# Patient Record
Sex: Female | Born: 1935 | Race: White | Hispanic: No | Smoking: Never smoker
Health system: Southern US, Community
[De-identification: ages and names within clinical notes are randomized; demographics above are authoritative.]

## PROBLEM LIST (undated history)

## (undated) DIAGNOSIS — K219 Gastro-esophageal reflux disease without esophagitis: Secondary | ICD-10-CM

## (undated) DIAGNOSIS — I10 Essential (primary) hypertension: Secondary | ICD-10-CM

## (undated) DIAGNOSIS — C439 Malignant melanoma of skin, unspecified: Secondary | ICD-10-CM

## (undated) DIAGNOSIS — I499 Cardiac arrhythmia, unspecified: Secondary | ICD-10-CM

## (undated) DIAGNOSIS — I739 Peripheral vascular disease, unspecified: Secondary | ICD-10-CM

## (undated) DIAGNOSIS — E039 Hypothyroidism, unspecified: Secondary | ICD-10-CM

## (undated) DIAGNOSIS — I82409 Acute embolism and thrombosis of unspecified deep veins of unspecified lower extremity: Secondary | ICD-10-CM

## (undated) HISTORY — PX: BACK SURGERY: SHX140

## (undated) HISTORY — PX: HIP ARTHROPLASTY: SHX981

## (undated) HISTORY — DX: Malignant melanoma of skin, unspecified: C43.9

## (undated) HISTORY — PX: EYE SURGERY: SHX253

## (undated) HISTORY — PX: APPENDECTOMY: SHX54

## (undated) HISTORY — PX: ABDOMINAL HYSTERECTOMY: SHX81

## (undated) HISTORY — DX: Acute embolism and thrombosis of unspecified deep veins of unspecified lower extremity: I82.409

## (undated) HISTORY — PX: BREAST SURGERY: SHX581

## (undated) HISTORY — PX: TONSILLECTOMY: SUR1361

## (undated) HISTORY — PX: WRIST FRACTURE SURGERY: SHX121

## (undated) HISTORY — PX: OTHER SURGICAL HISTORY: SHX169

---

## 1998-09-19 ENCOUNTER — Ambulatory Visit (HOSPITAL_COMMUNITY): Admission: RE | Admit: 1998-09-19 | Discharge: 1998-09-19 | Payer: Self-pay | Admitting: Gastroenterology

## 2001-07-03 ENCOUNTER — Encounter: Admission: RE | Admit: 2001-07-03 | Discharge: 2001-07-03 | Payer: Self-pay | Admitting: Surgery

## 2001-07-03 ENCOUNTER — Encounter: Payer: Self-pay | Admitting: Surgery

## 2002-07-05 ENCOUNTER — Encounter: Payer: Self-pay | Admitting: Family Medicine

## 2002-07-05 ENCOUNTER — Encounter: Admission: RE | Admit: 2002-07-05 | Discharge: 2002-07-05 | Payer: Self-pay | Admitting: Family Medicine

## 2002-08-13 ENCOUNTER — Encounter: Admission: RE | Admit: 2002-08-13 | Discharge: 2002-08-13 | Payer: Self-pay | Admitting: Family Medicine

## 2002-08-13 ENCOUNTER — Encounter: Payer: Self-pay | Admitting: Family Medicine

## 2005-07-31 ENCOUNTER — Encounter: Admission: RE | Admit: 2005-07-31 | Discharge: 2005-07-31 | Payer: Self-pay | Admitting: Family Medicine

## 2006-04-28 ENCOUNTER — Encounter (INDEPENDENT_AMBULATORY_CARE_PROVIDER_SITE_OTHER): Payer: Self-pay | Admitting: *Deleted

## 2006-04-28 ENCOUNTER — Encounter: Admission: RE | Admit: 2006-04-28 | Discharge: 2006-04-28 | Payer: Self-pay | Admitting: Family Medicine

## 2007-03-26 ENCOUNTER — Inpatient Hospital Stay (HOSPITAL_COMMUNITY): Admission: EM | Admit: 2007-03-26 | Discharge: 2007-03-29 | Payer: Self-pay | Admitting: Emergency Medicine

## 2007-03-27 ENCOUNTER — Encounter (INDEPENDENT_AMBULATORY_CARE_PROVIDER_SITE_OTHER): Payer: Self-pay | Admitting: Internal Medicine

## 2007-04-07 ENCOUNTER — Ambulatory Visit (HOSPITAL_COMMUNITY): Admission: RE | Admit: 2007-04-07 | Discharge: 2007-04-07 | Payer: Self-pay | Admitting: Family Medicine

## 2007-04-14 ENCOUNTER — Ambulatory Visit: Payer: Self-pay | Admitting: Internal Medicine

## 2007-05-11 ENCOUNTER — Encounter: Admission: RE | Admit: 2007-05-11 | Discharge: 2007-05-11 | Payer: Self-pay | Admitting: Family Medicine

## 2007-07-15 ENCOUNTER — Ambulatory Visit: Payer: Self-pay | Admitting: Internal Medicine

## 2007-10-19 ENCOUNTER — Encounter: Admission: RE | Admit: 2007-10-19 | Discharge: 2007-10-19 | Payer: Self-pay

## 2008-05-31 ENCOUNTER — Encounter: Admission: RE | Admit: 2008-05-31 | Discharge: 2008-05-31 | Payer: Self-pay | Admitting: Family Medicine

## 2009-06-12 ENCOUNTER — Encounter: Admission: RE | Admit: 2009-06-12 | Discharge: 2009-06-12 | Payer: Self-pay | Admitting: Internal Medicine

## 2009-06-17 ENCOUNTER — Encounter: Admission: RE | Admit: 2009-06-17 | Discharge: 2009-06-17 | Payer: Self-pay | Admitting: Orthopedic Surgery

## 2009-06-23 ENCOUNTER — Encounter: Admission: RE | Admit: 2009-06-23 | Discharge: 2009-06-23 | Payer: Self-pay | Admitting: Orthopedic Surgery

## 2009-06-28 ENCOUNTER — Ambulatory Visit (HOSPITAL_BASED_OUTPATIENT_CLINIC_OR_DEPARTMENT_OTHER): Admission: RE | Admit: 2009-06-28 | Discharge: 2009-06-28 | Payer: Self-pay | Admitting: Orthopedic Surgery

## 2009-12-24 ENCOUNTER — Emergency Department (HOSPITAL_COMMUNITY): Admission: EM | Admit: 2009-12-24 | Discharge: 2009-12-24 | Payer: Self-pay | Admitting: Emergency Medicine

## 2010-06-26 ENCOUNTER — Encounter: Admission: RE | Admit: 2010-06-26 | Discharge: 2010-06-26 | Payer: Self-pay | Admitting: Internal Medicine

## 2010-08-20 ENCOUNTER — Ambulatory Visit (HOSPITAL_COMMUNITY): Admission: RE | Admit: 2010-08-20 | Discharge: 2010-08-20 | Payer: Self-pay | Admitting: Internal Medicine

## 2011-02-16 LAB — POCT HEMOGLOBIN-HEMACUE: Hemoglobin: 13.4 g/dL (ref 12.0–15.0)

## 2011-02-17 LAB — BASIC METABOLIC PANEL
BUN: 15 mg/dL (ref 6–23)
CO2: 30 mEq/L (ref 19–32)
Calcium: 9.4 mg/dL (ref 8.4–10.5)
Chloride: 107 mEq/L (ref 96–112)
Creatinine, Ser: 0.85 mg/dL (ref 0.4–1.2)
GFR calc Af Amer: >60 mL/min (ref >60)
GFR calc non Af Amer: >60 mL/min (ref >60)
Glucose, Bld: 82 mg/dL (ref 70–99)
Potassium: 4.1 mEq/L (ref 3.5–5.1)
Sodium: 140 mEq/L (ref 135–145)

## 2011-03-26 NOTE — Discharge Summary (Signed)
[name], [name]               ACCOUNT NO.:  [id number]   MEDICAL RECORD NO.:  [id number]          PATIENT TYPE:  INP   LOCATION:  3743                         FACILITY:  [facility]   PHYSICIAN:  Altha Harm, MDDATE OF BIRTH:  1922-12-07   DATE OF ADMISSION:  03/26/2007  DATE OF DISCHARGE:  03/29/2007                               DISCHARGE SUMMARY   DISCHARGE DISPOSITION:  Home.   FINAL DISCHARGE DIAGNOSES:  1. Chest pain, noncardiac.  2. Hypertension, poorly controlled.  3. Right lung nodule.  4. Hyperlipidemia.  5. History of migraines.  6. History of gastroesophageal reflux disease.   DISCHARGE MEDICATIONS:  1. Zocor 20 mg p.o. daily at bedtime.  2. Armour Thyroid 30 mg p.o. daily.  3. Corgard 80 mg p.o. daily.  4. Protonix 40 mg p.o. daily a.c. breakfast.  5. Lisinopril 10 mg p.o. daily.   CONSULTANTS:  [facility], cardiology  portion.   PROCEDURES:  None.   DIAGNOSTIC STUDIES:  1. Portable chest x-ray done on Mar 26, 2007, which showed no active      lung disease, stable mild cardiomegaly.  2. CT angiogram of the chest which showed these impressions:      a.     Negative for pulmonary embolus.      b.     A 1.3 cm ovoid lesion in the superior region of the right       upper lobe.  There is an additional linear density in the superior       segment right upper lobe with adjacent calcifications.  These       findings are indeterminate and a neoplastic process cannot be       excluded.  There are a few additional punctate densities in the       right upper lobe.  This lesion may be better evaluated with PET       scan images to look for hypermetabolic activity.      c.     A determined mass or lesion involving the right hepatic lobe       along the lateral aspect measuring up to 3.2 cm.  This could       better be evaluated with some dedicated abdominal study.      d.     Large right renal cyst.  3. MRI of the abdomen done on Mar 28, 2007, which shows:      a.     Peripheral lesion in the right hepatic lobe, has MR features       compatible with an incidental hemangioma.      b.     Large right renal cyst.      c.     No acute or suspicious abdominal findings.      d.     The spleen, gallbladder and pancreas appear unremarkable.       There is no lymphadenopathy.  Retroaortic left vein is noted       incidentally.  4. Adenosine Myoview which showed a normal examination.  Left  ventricular ejection fraction 72% and left ventricular wall motion      globally normal.  Left ventricular cavity size appears grossly      normal on SPECT images.  There are no fixed or reversible diffusion      defects identified.  5. A 2-D echocardiogram done on Mar 27, 2007, which shows overall left      ventricular systolic function normal, there are no left ventricular      regional wall motion abnormalities.  The left ventricular wall      thickness mildly increased.  There is mild right ventricular      hypertrophy.   CODE STATUS:  FULL CODE.   ALLERGIES:  NO KNOWN DRUG ALLERGIES.   PRESENTING COMPLAINT:  Chest pain.   HISTORY OF PRESENT ILLNESS:  Please see H&P dictated by Dr. Ashley [name] for  the details of the HPI.   HOSPITAL COURSE:  Problem 1. CHEST PAIN.  The patient was admitted with  complaints of chest pain and was placed on the chest pain rule out MI  track.  The patient had serial enzymes performed which were essentially  negative.  Cardiology was consulted for a stress test and they saw the  patient and proceeded with an Adenosine Myoview with results as noted  above.  There was no plan by cardiology to proceed any further with a  cardiac cath at this time.  If the patient continues to have chest pain,  despite a negative stress test, certainly a cardiac catheterization  should be considered on this patient.  This patient has no plans to  follow up with cardiology post her hospital discharge and I will leave   it up to the discretion of her primary care physician to further make  referrals to cardiology if indeed she finds this necessary.   Problem 2. HYPERTENSION.  The patient does not have a known history of  hypertension however had been on Corgard for prophylaxis for migraines.  This could have been masking a diagnosis of hypertension in the past.  However, the patient's blood pressures while hospitalized were  consistently in the 150s/90s which makes her stage I hypertension.  Lisinopril 10 mg p.o. daily has been added to her usual regimen of  Corgard 80 mg p.o. daily.  The patient should follow up with her primary  care physician for further evaluation and titration of antihypertensive  medications.  The patient does have some mild increasing left  ventricular wall thickness which may suggest long-standing history of  hypertension.   Problem 3. INCIDENTAL FINDING OF A LUNG NODULE.  The patient had no  respiratory complaints coming in.  I have spoken at length with the  radiologist regarding this and the advised approach is that old x-rays  should be compared to the current x-rays and these can be sent over to  [facility] where they will be happy to make the comparison and  then if this comparison is not conclusive then a PET scan may be  indicated to further define the nature of the lung nodule.  In lieu of  all this, the patient can certainly be referred to a pulmonologist as an  outpatient for comprehensive evaluation of the lung nodule.  Please note  that the patient and family are very anxious regarding this as they have  had conflicting information regarding the interpretation of the lung  nodule and I would make this a priority in addressing this owing to the  patient's level of  anxiety regarding this.   Problem 4. HYPERLIPIDEMIA.  Studies on cholesterol with a fasting lipid  panel during her hospitalization revealed a mildly elevated lipid profile on this patient  showing her LDL to be mildly elevated at 128 and  triglycerides at 163.  The patient was started on Zocor 20 mg and should  have her LFTs and her cholesterol reevaluated in 6 weeks for further  titration of medication.  Please note that the baseline LFTs on this  patient are completely normal with an alk phos of 69, SGOT of 19, SGPT  of 17, and a bilirubin total of 0.9.   Problem 5. HISTORY OF MIGRAINE HEADACHE.  The patient had no headaches  while hospitalized and she was continued on her Corgard.   Problem 6. HISTORY OF HYPOTHYROIDISM.  The patient was continued on her  Armour Thyroid without any investigation of her thyroid function as she  had no signs or symptoms of hypo- or hyperthyroidism at this time.   CONDITION ON DISCHARGE:  The patient's condition on discharge is stable.   DISCHARGE EXAMINATION:  VITAL SIGNS:  Her blood pressure at discharge is  158/90, heart rate of 64, temperature of 97.9.  GENERAL:  The patient has no complaints of pain.  CARDIOVASCULAR EXAMINATION:  She has a normal S1 and S2, no murmurs,  rubs, or gallops noted and the patient has no chest pain at this time.  RESPIRATORY EXAMINATION:  The patient has normal respiratory effort,  equal excursion bilaterally, no wheezes or rhonchi noted, no changes of  egophony noted.  ABDOMINAL EXAMINATION:  Abdomen is soft, nontender, nondistended, no  masses, no hepatosplenomegaly noted.   FOLLOW UP:  The patient should follow up with her primary care physician  Dr. Herb Grays in 1 week.  Further investigation of the problems noted  in the problem section of this dictation, particularly follow up on the  lung nodule and the chest pain which is noncardiac.  Please note that  the patient has gastroesophageal reflux disease but states she that she  has only been taking her Prilosec on a p.r.n. basis and this may have  some significance in the patient's chest pain at this time.      Altha Harm, MD   Electronically Signed     MAM/MEDQ  D:  03/29/2007  T:  03/29/2007  Job:  [id number]   cc:   Tammy R. Collins Scotland, M.D.

## 2011-03-26 NOTE — Assessment & Plan Note (Signed)
[facility]                             PULMONARY OFFICE NOTE   NAME:STYERSClemma, Johnsen                      MRN:          [id number]  DATE:07/15/2007                            DOB:          1922-12-20    PULMONARY EXTENDED SUMMARY FINAL FOLLOWUP OFFICE VISIT   HISTORY:  This is a very nice 77 year old white female, never smoker,  with an atypical chest pain syndrome that was evaluated with a chest CT  scan showing an ovoid lesion in the superior segment of the right lower  lobe by CT scan dated Mar 26, 2007, but a PET scan dated Apr 07, 2007,  showed no evidence of activity and she returns now for a followup. She  denies any recurrent pain, dyspnea, fevers, chills, sweats, orthopnea,  PND or leg swelling, cough or intended weight loss.   PHYSICAL EXAMINATION:  She is a pleasant ambulatory white female in no  acute distress. She is afebrile with normal vital signs.  Lung fields are perfectly clear bilaterally to auscultation and  percussion.  HEART: Regular rate and rhythm without murmur, gallop or rub.  ABDOMEN: Soft and benign.  EXTREMITIES: Warm without calf tenderness, cyanosis or clubbing or  edema.   Chest x-ray today revealed no evidence of a definite nodule. Her old x-  rays dating back to 2006, also showed no evidence of a nodule.   IMPRESSION:  Microscopic (because it cannot be seen on plain films)  ovoid lesion in the right lower lobe with a negative PET scan is very  unlikely in this never smoker to represent malignancy. Technically she  should probably receive a followup limited CT scan through the same area  at 12 months from the original scan, but the patient after exhaustive  discussion of the risks, benefits and alternatives with the various  approaches has declined further followup. I told her that should she  change her mind that I think it would be reasonable for Dr.  Collins Scotland to  arrange for a limited CT scan through the same  area or alternatively a  regular followup chest x-ray in the context of a yearly comprehensive  health care evaluation would be helpful in that if she develops  macroscopic changes I would proceed directly with an excisional biopsy  rather than try to attempt any other form of biopsy. Pulmonary followup  in this office however is p.r.n.     Charlaine Dalton. Sherene Sires, MD, [facility]  Electronically Signed    MBW/MedQ  DD: 07/15/2007  DT: 07/15/2007  Job #: [id number]   cc:   Tammy R. Collins Scotland, M.D.

## 2011-03-26 NOTE — H&P (Signed)
NAME:  [name], [name] NO.:  [id number]   MEDICAL RECORD NO.:  [id number]          PATIENT TYPE:  INP   LOCATION:  3743                         FACILITY:  [facility]   PHYSICIAN:  Altha Harm, MDDATE OF BIRTH:  1923/08/10   DATE OF ADMISSION:  DATE OF DISCHARGE:                              HISTORY & PHYSICAL   CHIEF COMPLAINT:  Chest pain.   HISTORY OF PRESENT ILLNESS:  This is an 77 year old who looks much  younger than her stated age, who presents to the emergency room after  having an episode of chest pain starting this morning.  According to the  patient, the chest pain started in the epigastric region, and radiated  up the chest to the upper chest area.  The patient describes the pain as  initially a burning progressing up in  sharp pain.  She states that the  pain lasted about 30-45 minute intervals, then resolved, and recurred  again.  Thus, the pain is intermittent in nature.  She states the pain  finally abated when she had nitroglycerin given in the ambulance.  Please note that the patient does have history of gastroesophageal  reflux disease.  She has no history of hypertension, and she has no  history of coronary artery disease.  The patient has had a stress test  approximately 12 years ago, however since then, has had no cardiac  workup.  She states that normally her blood pressure is normal, however  the patient is unable to quantify her idea of normal.  The patient has  been on Corgard for prophylaxis of migraines, and not for hypertension.  The patient denies any associated shortness of breath or diaphoresis.  She denies any dizziness, loss of consciousness, seizure activity, any  claudication.  The patient denies any nausea, vomiting, diarrhea, any  fever or chills.   She does state that she has never had a pain such as this in the past.   PAST MEDICAL HISTORY:  1. Significant for polymyalgia rheumatica, not currently on       medication.  2. Gastroesophageal reflux disease.  3. Migraine headaches.  4. Hypothyroidism.   FAMILY HISTORY:  Significant for coronary artery disease.   SOCIAL HISTORY:  The patient resides alone, however close to her  children.  She denies any tobacco, alcohol, or drug use.  She is  extremely active at a community level.   CURRENT MEDICATIONS:  1. Corgard 80 mg p.o. daily.  2. Armour Thyroid 40 mg p.o. daily.  3. Prilosec 20 mg daily p.r.n.   ALLERGIES:  No known drug allergies.   PRIMARY CARE PHYSICIAN:  Dr. Collins Scotland.   REVIEW OF SYSTEMS:  Fourteen systems were reviewed, and all systems are  negative except as noted in the HPI.   Studies in the emergency room showed the following:  Hemogram showed WBC  of 7.8, hemoglobin 13.1, hematocrit 39.1, platelets 266.  Sodium 141,  potassium 3.5, chloride 109, bicarbonate 29, BUN 18, creatinine 1.0.  CK  81, CK-MB 1.0, troponin less than 0.05.  D-dimer is mildly elevated at  0.77.  A BNP  is 44.  The patient had a 12-lead EKG, which showed normal  sinus rhythm, and a CT pulmonary angiogram shows no evidence of  pulmonary embolus.   PHYSICAL EXAMINATION:  VITAL SIGNS:  On arrival to the emergency room,  the patient had a blood pressure of 160/90.  The blood pressure is now  157/82.  Her heart rate is 66, respiratory rate 16, pulse oximetry 99%  on room air.  HEENT:  Normocephalic, atraumatic.  Pupils are equal, round, reactive to  light and accommodation.  Extraocular movements are intact, fundi  benign.  Tympanic membranes are translucent bilaterally with good  landmarks.  Nasal mucosa showed no polyps.  Oropharynx is moist.  No  exudate, erythema, or induration is noted.  NECK:  Trachea midline.  No masses, no thyromegaly, no JVD, no carotid  bruit.  CARDIOVASCULAR:  The patient has normal S1 and S2.  No murmurs, rubs, or  gallops are noted.  PMI is nondisplaced.  No heaves, thrills with  palpation.  RESPIRATORY:  The patient has  normal respiratory effort.  She has equal  excursion bilaterally.  No wheezing or rhonchi are noted, no increased  vocal fremitus.  She is clear to auscultation.  ABDOMEN:  The patient has normoactive bowel sounds.  She has a  protuberant abdomen, which is nontender, nondistended.  No masses, no  hepatosplenomegaly noted.  LYMPH NODE SURVEY:  She has no cervical, axillary, or inguinal  lymphadenopathy noted.  MUSCULOSKELETAL:  She has no warmth, swelling, or erythema around the  joints.  There is no spinal tenderness to palpation.  Range of motion is  functional in joints of the upper and lower extremities.  PSYCHIATRIC:  She is alert and oriented x3.  She has good insight, good  cognition, and good recent and remote recall.  NEUROLOGIC:  Cranial nerves II-XII are grossly intact.  There are no  focal neurological deficits.  DTRs are 2+ bilaterally to the upper and  lower extremities.  Sensation is intact to light touch, pinprick, and  proprioception.  Strength is 5/5 in the bilateral upper and lower  extremities.   ASSESSMENT AND PLAN:  1. This is a patient who presents with chest pain.  2. Hypertension.  3. The patient does have a history of polymyalgia rheumatica.  4. History of migraine.  5. Hypothyroidism.  6. Reflux disease.   The patient will be ruled out for MI with serial enzymes, and if her  serial enzymes are normal, we will progress to a stress test with  discussion about whether the stress test will take place in the  hospital.  Blood pressures will be monitored overnight.  The patient is  already on a beta-blocker, and we will continue to work to control blood  pressures to keep systolics less than 150, and diastolics less than 100.  The patient will be resumed on her usual medications including Corgard,  Armour Thyroid, and Protonix while hospitalized.      Altha Harm, MD  Electronically Signed    MAM/MEDQ  D:  03/26/2007  T:  03/27/2007  Job:   [id number]

## 2011-03-26 NOTE — Op Note (Signed)
[name], [name]               ACCOUNT NO.:  [id number]   MEDICAL RECORD NO.:  [id number]          PATIENT TYPE:  AMB   LOCATION:  [facility]                         FACILITY:  [facility]   PHYSICIAN:  Marlowe Kays, M.D.  DATE OF BIRTH:  1923-10-28   DATE OF PROCEDURE:  06/28/2009  DATE OF DISCHARGE:                               OPERATIVE REPORT   PREOPERATIVE DIAGNOSES:  1. Torn lateral meniscus.  2. Osteoarthritis, right knee.   POSTOPERATIVE DIAGNOSES:  1. Torn medial and lateral menisci, right knee.  2. Osteoarthritis.   OPERATION:  Right knee arthroscopy with  1. Partial medial meniscectomy.  2. Shaving of the medial femoral condyle.   SURGEON:  Marlowe Kays, M.D.   ASSISTANT:  Nurse.   ANESTHESIA:  General.   PATHOLOGY AND JUSTIFICATION FOR PROCEDURE:  Painful right knee.  Because  of painful right knee with tenderness on the lateral joint line, I  obtained an MRI demonstrating the above pathology.  She also on  inspection had some fairly significant border tearing of the medial  meniscus posteriorly.   PROCEDURE:  Ace wrap and knee support to left lower extremity pneumatic  tourniquet applied to right lower extremity, with the leg Esmarched out  non sterilely and the tourniquet inflated to 300 mmHg.  Thigh stabilizer  applied.  The right leg prepped with DuraPrep from stabilizer to ankle,  draped in sterile field.  Time-out performed.  Superior medial saline  inflow.  First through an anterolateral portal medial compartment joint  was evaluated.  In general her medial compartment knee joint looked  good.  There was approximately grade 2/4 chondromalacia of the medial  femoral condyle which I smoothed down with 3.5 shaver.  Posteriorly she  had serration to the posterior medial inner border of the meniscus which  I shaved down until smooth with a 3.5 shaver as well.  Final pictures  were taken.  I then looked up the medial gutter and suprapatellar area.  She had  minimal wear of the patella.  No other abnormalities noted.  I  then reversed portals.  Her lateral meniscus was badly macerated and  torn and I first cleaned it up somewhat with a 3.5 shaver for better  visualization and then used arthroscopic scissors to trim back the torn  segment of the anterior lateral meniscus and removing the fragments with  the shaver once again.  Then looking posteriorly she actually had  additional tearing in the mid posterior inner border.  After I shaved  down until smooth with a 3.5 shaver she had minimal wear of the lateral  femoral condyle.  The joint was irrigated until clear and all fluid  possible was removed.  I closed the two anterior portals and with 4-0  nylon and then injected 20 mL 0.5%  Marcaine adrenaline through the inflow apparatus closing this portal  with 4-0 nylon as well.  Betadine Adaptic dry sterile dressing were  applied.  Tourniquet was released.  She tolerated the procedure well was  taken, was taken to recovery room in satisfactory condition with no  known complications.  ______________________________  Marlowe Kays, M.D.     JA/MEDQ  D:  06/28/2009  T:  06/29/2009  Job:  [id number]

## 2011-03-26 NOTE — Op Note (Signed)
[name], [name]               ACCOUNT NO.:  [id number]   MEDICAL RECORD NO.:  [id number]          PATIENT TYPE:  AMB   LOCATION:  [facility]                         [facility]ACILITY:  [facility]   PHYSICIAN:  Marlowe Kays, M.D.  DATE OF BIRTH:  05/06/1923   DATE OF PROCEDURE:  06/28/2009  DATE OF DISCHARGE:  06/28/2009                               OPERATIVE REPORT   ADDENDUM:  The only error is in the operation, which was stated:  1. Right knee arthroscopy with partial medial meniscectomy.  2. Shaving of medial femoral condyle.   This should read:  1. Right knee arthroscopy with partial medial and lateral      meniscectomies.  2. Shaving of medial femoral condyle.  3. I performed a lateral meniscectomy as well, and this was not      reflected in the operation title.           ______________________________  Marlowe Kays, M.D.     JA/MEDQ  D:  08/18/2009  T:  08/18/2009  Job:  [id number]

## 2011-03-26 NOTE — Assessment & Plan Note (Signed)
[facility]                             PULMONARY OFFICE NOTE   NAME:STYERSMaddyn, Lieurance                      MRN:          [id number]  DATE:04/14/2007                            DOB:          22-Nov-1922    HISTORY:  This is an 77 year old white female, never a smoker, who  presents with intermittent chest pain, dating back 18 months, typically  in the center, consisting of a centralized chest pressure.  It comes  and goes within 15 minutes, maybe better after burping, but had a severe  episode 2 weeks ago and ended up in the emergency room.  She was  admitted to the hospital on Mar 18, 2007 for evaluation of chest pain and  had essentially a negative cardiac workup with a CT scan of the chest,  indicating a right lower lobe nodule which on PET scanning was negative  for uptake.  There was a1.3 cm ovoid shapedlesion in the superior  segment of the right lower lobe with several other non-specific lung  densities.  Subsequent PET scan was done on Apr 07, 2007 indicating no  evidence of hyper metabolism, and she is now seen at Dr. Yehuda Budd'  request.  The patient has not had any pleuritic pain or cough.  She says  she has never had posterior chest discomfort on the right, just anterior  centralized pain as discussed above.  This pain is not exertional nor  pleuritic and seemed to resolve spontaneously just as well as it did  with any medicine that she has ever received or could give credit to.   PAST MEDICAL HISTORY:  Significant for hysterectomy, appendectomy.  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 Phylliss Bob,  chronically on steroids.   ALLERGIES:  None known.   MEDICATIONS:  1. Corgard.  2. Thyroid.  3. Prednisone.   SOCIAL HISTORY:  She has never smoked.  She lives alone.   FAMILY HISTORY:  Positive for breast cancer in her mother, emphysema in  her father.  She has been exposed to passive cigarette smoke by her  father and  husband.   REVIEW OF SYSTEMS:  Taken in detail on the work sheet, significant for  the problems as outlined above.   PHYSICAL EXAMINATION:  This is a pleasant, but anxious white female in  no acute distress.  She has stable vital signs.  HEENT:  Unremarkable.  Pharynx clear. Dentition intact.  NECK:  Supple without cervical adenopathy or tenderness.  Trachea is  midline.No thyromegaly.  LUNG FIELDS:  Perfectly clear bilaterally to auscultation and  percussion.  There is a regular rate and rhythm without murmur, gallop or rub  present.  ABDOMEN:  Soft, benign with no palpable organomegaly, mass or  tenderness.  EXTREMITIES:  Warm without calf tenderness, cyanosis, clubbing or edema.   Hemoglobin saturation was 96% on room air.  She does have a chest x-ray  from 2006 that was read as normal.  Interestingly, a chest x-ray from  2000 shows a densely calcified right lower lobe nodule.   IMPRESSION:  This patient appears  to have a new non-calcified nodule  along with an old calcified nodule, both in the right lower lobe, which  have nothing to do with her recent symptomatology.  Although the PET  scan is reassuring in that it is negative, this could still represent a  well differentiated, slowly growing malignancy.  Her only risk factor is  that she has been exposed both by her father and late husband to  cigarette smoke.   However, this lesion is too small to biopsy so we are left with the  issue of whether she needs to go ahead and have an excisional biopsy  done by VATS or a watch and wait approach.  Statistically, I think it is  unlikely to be malignancy based on the PET scan and the fact that she  has never smoked and therefore I recommended that she simply return her  in 6 weeks with a new set of x-rays, at that time bring her old x-rays  with her.  If we can clearly identify the lesion on a plain film, then  it is not necessary to follow it so closely on CT scan and it would   spare her significant radiation exposure.  If it shows any growth at  all, it needs to be removed regardless of whether it ultimately proves  to be a benign lesion.     Charlaine Dalton. Sherene Sires, MD, [facility]  Electronically Signed    MBW/MedQ  DD: 04/15/2007  DT: 04/15/2007  Job #: [id number]   cc:   Tammy R. Collins [place], M.D.

## 2011-05-28 ENCOUNTER — Other Ambulatory Visit: Payer: Self-pay | Admitting: Internal Medicine

## 2011-05-28 DIAGNOSIS — Z1231 Encounter for screening mammogram for malignant neoplasm of breast: Secondary | ICD-10-CM

## 2011-07-10 ENCOUNTER — Ambulatory Visit
Admission: RE | Admit: 2011-07-10 | Discharge: 2011-07-10 | Disposition: A | Discharge status: Home / Self Care | Payer: BC Managed Care – PPO | Source: Ambulatory Visit | Attending: Internal Medicine | Admitting: Internal Medicine

## 2011-07-10 DIAGNOSIS — Z1231 Encounter for screening mammogram for malignant neoplasm of breast: Secondary | ICD-10-CM

## 2011-10-23 ENCOUNTER — Other Ambulatory Visit: Payer: Self-pay | Admitting: Physician Assistant

## 2011-11-01 ENCOUNTER — Encounter (HOSPITAL_COMMUNITY): Payer: Self-pay

## 2011-11-18 ENCOUNTER — Encounter (HOSPITAL_COMMUNITY)
Admission: RE | Admit: 2011-11-18 | Discharge: 2011-11-18 | Disposition: A | Discharge status: Home / Self Care | Payer: Medicare Other | Source: Ambulatory Visit | Attending: Orthopedic Surgery | Admitting: Orthopedic Surgery

## 2011-11-18 ENCOUNTER — Other Ambulatory Visit: Payer: Self-pay

## 2011-11-18 ENCOUNTER — Encounter (HOSPITAL_COMMUNITY): Payer: Self-pay

## 2011-11-18 HISTORY — DX: Essential (primary) hypertension: I10

## 2011-11-18 HISTORY — DX: Gastro-esophageal reflux disease without esophagitis: K21.9

## 2011-11-18 HISTORY — DX: Hypothyroidism, unspecified: E03.9

## 2011-11-18 LAB — BASIC METABOLIC PANEL
BUN: 17 mg/dL (ref 6–23)
CO2: 26 mEq/L (ref 19–32)
Calcium: 9.4 mg/dL (ref 8.4–10.5)
Chloride: 105 mEq/L (ref 96–112)
Creatinine, Ser: 1.02 mg/dL (ref 0.50–1.10)
GFR calc Af Amer: 56 mL/min — ABNORMAL LOW (ref >90)
GFR calc non Af Amer: 48 mL/min — ABNORMAL LOW (ref >90)
Glucose, Bld: 107 mg/dL — ABNORMAL HIGH (ref 70–99)
Potassium: 3.9 mEq/L (ref 3.5–5.1)
Sodium: 141 mEq/L (ref 135–145)

## 2011-11-18 LAB — CBC
HCT: 38.6 % (ref 36.0–46.0)
Hemoglobin: 12.7 g/dL (ref 12.0–15.0)
MCH: 29.8 pg (ref 26.0–34.0)
MCHC: 32.9 g/dL (ref 30.0–36.0)
MCV: 90.6 fL (ref 78.0–100.0)
Platelets: 301 10*3/uL (ref 150–400)
RBC: 4.26 MIL/uL (ref 3.87–5.11)
RDW: 13 % (ref 11.5–15.5)
WBC: 7.6 10*3/uL (ref 4.0–10.5)

## 2011-11-18 LAB — SURGICAL PCR SCREEN
MRSA, PCR: NEGATIVE
Staphylococcus aureus: NEGATIVE

## 2011-11-18 NOTE — Patient Instructions (Signed)
20 [name]  11/18/2011   Your procedure is scheduled on:  11/20/11 0830-1100 am   Report to [facility] at 0630 AM.  Call this number if you have problems the morning of surgery: [phone]   Remember:   Do not eat food:After Midnight.  May have clear liquids:until Midnight .  Clear liquids include soda, tea, black coffee, apple or grape juice, broth.  Take these medicines the morning of surgery with A SIP OF WATER:    Do not wear jewelry, make-up or nail polish.  Do not wear lotions, powders, or perfumes.   Do not shave 48 hours prior to surgery.  Do not bring valuables to the hospital.  Contacts, dentures or bridgework may not be worn into surgery.  Leave suitcase in the car. After surgery it may be brought to your room.  For patients admitted to the hospital, checkout time is 11:00 AM the day of discharge.     Special Instructions: CHG Shower Use Special Wash: 1/2 bottle night before surgery and 1/2 bottle morning of surgery. shower chin to toes with CHG.  Wash face and private parts with regular soap.    Please read over the following fact sheets that you were given: MRSA Information, Incentive Spirometry Fact Sheet, coughing and deep breathing exercises, leg exercises

## 2011-11-18 NOTE — Pre-Procedure Instructions (Signed)
11/18/11 requested latest CXR done and to fax to [facility]- PST.

## 2011-11-19 NOTE — Pre-Procedure Instructions (Signed)
11/19/11 received CXR date 6/11 from PCP office.  Placed on chart.  Pt will need CXR day of surgery.  Order placed in computer.

## 2011-11-19 NOTE — H&P (Signed)
NAME:  [name], [name]                    ACCOUNT NO.:  MEDICAL RECORD NO.:  [id number]  LOCATION:                                 FACILITY:  PHYSICIAN:  Marlowe Kays, M.D.  DATE OF BIRTH:  Mar 14, 1923  DATE OF ADMISSION:  11/20/2011 DATE OF DISCHARGE:                             HISTORY & PHYSICAL   CHIEF COMPLAINT:  "Pain in my legs."  PRESENT ILLNESS:  This 78 year old, white female is seen by [place] for continued progressive problems, concerning pain into her low back with radiation down the left lower extremity.  She is noted to have pain, weakness, as well as discomfort with her present illness.  MRI has shown conservative amount of stenosis in the lumbar spine.  Also an MRI was noted to have some renal cyst, thus she was followed up with Dr. Isabel [name] with Urology.  Discussion was had with the patient, the family, and Dr. Simonne [name] about the next step and has agreed after the risks and benefits of surgery were described to them to go ahead with a decompressive lumbar laminectomy.  PAST MEDICAL HISTORY:  She had been in relatively good health throughout her lifetime.  Surgically, she has had appendectomy, hysterectomy, knee surgery, and carpal tunnel release.  She has hypertension, thyroid disorder, and dyspepsia.  CURRENT MEDICATIONS: 1. Prilosec over the counter. 2. Tramadol 50 mg daily. 3. Nadolol 80 mg daily. 4. Levothyroxine 50 daily. 5. Toradol 10 mg, she stopped about 5 days ago. 6. Amlodipine besylate 10 mg daily. 7. Lyrica 50 mg daily.  FAMILY PHYSICIAN:  [name] Garbe, MD  SOCIAL HISTORY:  Neither smokes nor drinks.  FAMILY HISTORY:  Noncontributory.  REVIEW OF SYSTEMS:  CNS:  No seizures or paralysis, numbness, double vision.  RESPIRATORY:  No productive cough.  No hemoptysis.  No shortness of breath.  CARDIOVASCULAR:  No chest pain.  No angina.  No orthopnea.  GASTROINTESTINAL:  No nausea, vomiting, melena, or bloody stool.  GENITOURINARY:  No  discharge, dysuria, hematuria, but she does have stress incontinence and urgency.  PHYSICAL EXAMINATION:  GENERAL:  Alert and cooperative, friendly 78 year old, white female, looking younger than her stated age. VITAL SIGNS:  Blood pressure 120/80, pulse 60, and respirations 12. HEENT:  Normocephalic.  PERRLA.  EOM intact.  Oropharynx is clear. CHEST:  Clear to auscultation.  No rhonchi.  No rales.  No wheezes. HEART:  Regular rate and rhythm.  No murmurs are heard. ABDOMEN:  Soft, nontender. GENITALIA/RECTAL/PELVIC:  Not done, pertinent to present illness. EXTREMITIES:  The patient has no deformities.  Straight leg raise negative bilaterally.  ADMITTING DIAGNOSES: 1. Degenerative disk disease with spinal stenosis. 2. Hypertension. 3. Hypothyroidism. 4. Dyspepsia.  PLAN:  The patient will undergo decompressive lumbar laminectomy.  We went over in detail today concerning the perioperative plan.     [name].   ______________________________ Marlowe Kays, M.D.    DLU/MEDQ  D:  11/18/2011  T:  11/19/2011  Job:  [id number]  cc:   [name], M.D. Fax: [phone]

## 2011-11-20 ENCOUNTER — Inpatient Hospital Stay (HOSPITAL_COMMUNITY): Payer: Medicare Other

## 2011-11-20 ENCOUNTER — Encounter (HOSPITAL_COMMUNITY): Payer: Self-pay | Admitting: Anesthesiology

## 2011-11-20 ENCOUNTER — Inpatient Hospital Stay (HOSPITAL_COMMUNITY)
Admission: RE | Admit: 2011-11-20 | Discharge: 2011-11-25 | DRG: 491 | Disposition: A | Discharge status: Skilled Nursing Facility | Payer: Medicare Other | Source: Ambulatory Visit | Attending: Orthopedic Surgery | Admitting: Orthopedic Surgery

## 2011-11-20 ENCOUNTER — Encounter (HOSPITAL_COMMUNITY): Payer: Self-pay | Admitting: *Deleted

## 2011-11-20 ENCOUNTER — Encounter (HOSPITAL_COMMUNITY)
Admission: RE | Disposition: A | Discharge status: Skilled Nursing Facility | Payer: Self-pay | Source: Ambulatory Visit | Attending: Orthopedic Surgery

## 2011-11-20 ENCOUNTER — Inpatient Hospital Stay (HOSPITAL_COMMUNITY): Payer: Medicare Other | Admitting: Anesthesiology

## 2011-11-20 DIAGNOSIS — M48061 Spinal stenosis, lumbar region without neurogenic claudication: Principal | ICD-10-CM | POA: Diagnosis present

## 2011-11-20 DIAGNOSIS — Z9889 Other specified postprocedural states: Secondary | ICD-10-CM

## 2011-11-20 DIAGNOSIS — E039 Hypothyroidism, unspecified: Secondary | ICD-10-CM | POA: Diagnosis present

## 2011-11-20 DIAGNOSIS — I10 Essential (primary) hypertension: Secondary | ICD-10-CM | POA: Diagnosis present

## 2011-11-20 DIAGNOSIS — K219 Gastro-esophageal reflux disease without esophagitis: Secondary | ICD-10-CM | POA: Diagnosis present

## 2011-11-20 DIAGNOSIS — Z01812 Encounter for preprocedural laboratory examination: Secondary | ICD-10-CM

## 2011-11-20 HISTORY — PX: LUMBAR LAMINECTOMY/DECOMPRESSION MICRODISCECTOMY: SHX5026

## 2011-11-20 SURGERY — LUMBAR LAMINECTOMY/DECOMPRESSION MICRODISCECTOMY
Anesthesia: General | Site: Back | Wound class: Clean

## 2011-11-20 MED ORDER — HEMOSTATIC AGENTS (NO CHARGE) OPTIME
TOPICAL | Status: DC | PRN
  Administered 2011-11-20: 1 via TOPICAL

## 2011-11-20 MED ORDER — LACTATED RINGERS IV SOLN: INTRAVENOUS | Status: DC

## 2011-11-20 MED ORDER — ONDANSETRON HCL 4 MG/2ML IJ SOLN: 4.0000 mg | Freq: Four times a day (QID) | INTRAMUSCULAR | Status: DC | PRN

## 2011-11-20 MED ORDER — EPHEDRINE SULFATE 50 MG/ML IJ SOLN
INTRAMUSCULAR | Status: DC | PRN
  Administered 2011-11-20 (×2): 5 mg via INTRAVENOUS
  Administered 2011-11-20: 10 mg via INTRAVENOUS
  Administered 2011-11-20: 5 mg via INTRAVENOUS
  Administered 2011-11-20: 10 mg via INTRAVENOUS
  Administered 2011-11-20: 5 mg via INTRAVENOUS

## 2011-11-20 MED ORDER — DOCUSATE SODIUM 100 MG PO CAPS
100.0000 mg | ORAL_CAPSULE | Freq: Two times a day (BID) | ORAL | Status: DC
  Administered 2011-11-20 – 2011-11-25 (×10): 100 mg via ORAL
  Filled 2011-11-20 (×15): qty 1

## 2011-11-20 MED ORDER — SODIUM CHLORIDE 0.9 % IJ SOLN: 9.0000 mL | INTRAMUSCULAR | Status: DC | PRN

## 2011-11-20 MED ORDER — METHOCARBAMOL 500 MG PO TABS: 500.0000 mg | ORAL_TABLET | Freq: Four times a day (QID) | ORAL | Status: DC | PRN

## 2011-11-20 MED ORDER — FENTANYL CITRATE 0.05 MG/ML IJ SOLN
INTRAMUSCULAR | Status: DC | PRN
  Administered 2011-11-20: 50 ug via INTRAVENOUS
  Administered 2011-11-20 (×3): 25 ug via INTRAVENOUS

## 2011-11-20 MED ORDER — NALOXONE HCL 0.4 MG/ML IJ SOLN: 0.4000 mg | INTRAMUSCULAR | Status: DC | PRN

## 2011-11-20 MED ORDER — HYDROMORPHONE 0.3 MG/ML IV SOLN
INTRAVENOUS | Status: DC
  Administered 2011-11-20: 12:00:00 via INTRAVENOUS
  Administered 2011-11-20: 0.3 mg via INTRAVENOUS
  Administered 2011-11-20: 0.799 mg via INTRAVENOUS
  Administered 2011-11-21: 0.199 mg via INTRAVENOUS
  Administered 2011-11-21: 17:00:00 via INTRAVENOUS
  Administered 2011-11-21: 0.8 mg via INTRAVENOUS
  Administered 2011-11-21: 1.99 mg via INTRAVENOUS
  Administered 2011-11-21: 0.4 mg via INTRAVENOUS
  Administered 2011-11-21: 1.39 mg via INTRAVENOUS
  Administered 2011-11-21: 0.799 mg via INTRAVENOUS
  Administered 2011-11-22: 0.599 mg via INTRAVENOUS
  Administered 2011-11-22: 0.4 mg via INTRAVENOUS
  Administered 2011-11-22: 0.2 mg via INTRAVENOUS
  Administered 2011-11-22: 0.199 mg via INTRAVENOUS
  Administered 2011-11-22: 0.799 mg via INTRAVENOUS
  Administered 2011-11-22: 0.2 mg via INTRAVENOUS
  Filled 2011-11-20 (×3): qty 25

## 2011-11-20 MED ORDER — 0.9 % SODIUM CHLORIDE (POUR BTL) OPTIME
TOPICAL | Status: DC | PRN
  Administered 2011-11-20: 1000 mL

## 2011-11-20 MED ORDER — SODIUM CHLORIDE 0.9 % IV SOLN: INTRAVENOUS | Status: DC

## 2011-11-20 MED ORDER — DIPHENHYDRAMINE HCL 50 MG/ML IJ SOLN: 12.5000 mg | Freq: Four times a day (QID) | INTRAMUSCULAR | Status: DC | PRN

## 2011-11-20 MED ORDER — PANTOPRAZOLE SODIUM 40 MG PO TBEC
40.0000 mg | DELAYED_RELEASE_TABLET | Freq: Every day | ORAL | Status: DC
  Administered 2011-11-20 – 2011-11-25 (×6): 40 mg via ORAL
  Filled 2011-11-20 (×7): qty 1

## 2011-11-20 MED ORDER — CHLORHEXIDINE GLUCONATE 4 % EX LIQD: 60.0000 mL | Freq: Once | CUTANEOUS | Status: DC

## 2011-11-20 MED ORDER — NADOLOL 80 MG PO TABS
80.0000 mg | ORAL_TABLET | Freq: Every day | ORAL | Status: DC
  Administered 2011-11-21 – 2011-11-25 (×5): 80 mg via ORAL
  Filled 2011-11-20 (×6): qty 1

## 2011-11-20 MED ORDER — HYDROMORPHONE HCL PF 1 MG/ML IJ SOLN
0.2500 mg | INTRAMUSCULAR | Status: DC | PRN
  Administered 2011-11-20 (×4): 0.5 mg via INTRAVENOUS

## 2011-11-20 MED ORDER — SUCCINYLCHOLINE CHLORIDE 20 MG/ML IJ SOLN
INTRAMUSCULAR | Status: DC | PRN
  Administered 2011-11-20: 100 mg via INTRAVENOUS

## 2011-11-20 MED ORDER — CEFAZOLIN SODIUM 1-5 GM-% IV SOLN
1.0000 g | Freq: Four times a day (QID) | INTRAVENOUS | Status: AC
  Administered 2011-11-20 – 2011-11-21 (×3): 1 g via INTRAVENOUS
  Filled 2011-11-20 (×3): qty 50

## 2011-11-20 MED ORDER — ONDANSETRON HCL 4 MG PO TABS: 4.0000 mg | ORAL_TABLET | Freq: Four times a day (QID) | ORAL | Status: DC | PRN

## 2011-11-20 MED ORDER — METHOCARBAMOL 100 MG/ML IJ SOLN
500.0000 mg | Freq: Four times a day (QID) | INTRAVENOUS | Status: DC | PRN
  Administered 2011-11-20: 500 mg via INTRAVENOUS
  Filled 2011-11-20: qty 5

## 2011-11-20 MED ORDER — POVIDONE-IODINE 7.5 % EX SOLN: Freq: Once | CUTANEOUS | Status: DC

## 2011-11-20 MED ORDER — LACTATED RINGERS IV SOLN
INTRAVENOUS | Status: DC | PRN
  Administered 2011-11-20 (×3): via INTRAVENOUS

## 2011-11-20 MED ORDER — NEOSTIGMINE METHYLSULFATE 1 MG/ML IJ SOLN
INTRAMUSCULAR | Status: DC | PRN
  Administered 2011-11-20: 4 mg via INTRAVENOUS

## 2011-11-20 MED ORDER — MEPERIDINE HCL 50 MG/ML IJ SOLN: 6.2500 mg | INTRAMUSCULAR | Status: DC | PRN

## 2011-11-20 MED ORDER — DIPHENHYDRAMINE HCL 12.5 MG/5ML PO ELIX: 12.5000 mg | ORAL_SOLUTION | Freq: Four times a day (QID) | ORAL | Status: DC | PRN

## 2011-11-20 MED ORDER — CEFAZOLIN SODIUM 1-5 GM-% IV SOLN
1.0000 g | INTRAVENOUS | Status: AC
  Administered 2011-11-20: 1 g via INTRAVENOUS

## 2011-11-20 MED ORDER — ACETAMINOPHEN 10 MG/ML IV SOLN
INTRAVENOUS | Status: DC | PRN
  Administered 2011-11-20: 1000 mg via INTRAVENOUS

## 2011-11-20 MED ORDER — METOCLOPRAMIDE HCL 10 MG PO TABS: 5.0000 mg | ORAL_TABLET | Freq: Three times a day (TID) | ORAL | Status: DC | PRN

## 2011-11-20 MED ORDER — LIDOCAINE HCL (CARDIAC) 20 MG/ML IV SOLN
INTRAVENOUS | Status: DC | PRN
  Administered 2011-11-20: 30 mg via INTRAVENOUS

## 2011-11-20 MED ORDER — HETASTARCH-ELECTROLYTES 6 % IV SOLN
INTRAVENOUS | Status: DC | PRN
  Administered 2011-11-20: 10:00:00 via INTRAVENOUS

## 2011-11-20 MED ORDER — AMLODIPINE BESYLATE 10 MG PO TABS
10.0000 mg | ORAL_TABLET | Freq: Every day | ORAL | Status: DC
  Administered 2011-11-21 – 2011-11-25 (×5): 10 mg via ORAL
  Filled 2011-11-20 (×6): qty 1

## 2011-11-20 MED ORDER — THROMBIN 5000 UNITS EX SOLR
CUTANEOUS | Status: DC | PRN
  Administered 2011-11-20: 10000 [IU] via TOPICAL

## 2011-11-20 MED ORDER — PROPOFOL 10 MG/ML IV EMUL
INTRAVENOUS | Status: DC | PRN
  Administered 2011-11-20: 150 mg via INTRAVENOUS

## 2011-11-20 MED ORDER — ONDANSETRON HCL 4 MG/2ML IJ SOLN
INTRAMUSCULAR | Status: DC | PRN
  Administered 2011-11-20 (×2): 1 mg via INTRAVENOUS
  Administered 2011-11-20: 2 mg via INTRAVENOUS

## 2011-11-20 MED ORDER — LEVOTHYROXINE SODIUM 50 MCG PO TABS
50.0000 ug | ORAL_TABLET | Freq: Every day | ORAL | Status: DC
  Administered 2011-11-21 – 2011-11-25 (×5): 50 ug via ORAL
  Filled 2011-11-20 (×6): qty 1

## 2011-11-20 MED ORDER — DEXTROSE-NACL 5-0.2 % IV SOLN
INTRAVENOUS | Status: DC
  Administered 2011-11-20 – 2011-11-22 (×6): via INTRAVENOUS
  Filled 2011-11-20: qty 1000

## 2011-11-20 MED ORDER — CISATRACURIUM BESYLATE 2 MG/ML IV SOLN
INTRAVENOUS | Status: DC | PRN
  Administered 2011-11-20: 1 mg via INTRAVENOUS
  Administered 2011-11-20: 3 mg via INTRAVENOUS
  Administered 2011-11-20: 5 mg via INTRAVENOUS

## 2011-11-20 MED ORDER — PROMETHAZINE HCL 25 MG/ML IJ SOLN
6.2500 mg | INTRAMUSCULAR | Status: DC | PRN
  Administered 2011-11-20: 6.25 mg via INTRAVENOUS

## 2011-11-20 MED ORDER — METOCLOPRAMIDE HCL 5 MG/ML IJ SOLN: 5.0000 mg | Freq: Three times a day (TID) | INTRAMUSCULAR | Status: DC | PRN

## 2011-11-20 MED ORDER — GLYCOPYRROLATE 0.2 MG/ML IJ SOLN
INTRAMUSCULAR | Status: DC | PRN
  Administered 2011-11-20: .6 mg via INTRAVENOUS

## 2011-11-20 SURGICAL SUPPLY — 46 items
APL SKNCLS STERI-STRIP NONHPOA (GAUZE/BANDAGES/DRESSINGS) ×1
BAG SPEC THK2 15X12 ZIP CLS (MISCELLANEOUS) ×1
BAG ZIPLOCK 12X15 (MISCELLANEOUS) ×2 IMPLANT
BENZOIN TINCTURE PRP APPL 2/3 (GAUZE/BANDAGES/DRESSINGS) ×2 IMPLANT
BUR EGG ELITE 4.0 (BURR) ×1 IMPLANT
CLEANER TIP ELECTROSURG 2X2 (MISCELLANEOUS) ×2 IMPLANT
CLOTH BEACON ORANGE TIMEOUT ST (SAFETY) ×2 IMPLANT
CONT SPEC 4OZ CLIKSEAL STRL BL (MISCELLANEOUS) ×2 IMPLANT
DRAIN PENROSE 18X1/4 LTX STRL (WOUND CARE) IMPLANT
DRAPE MICROSCOPE LEICA (MISCELLANEOUS) ×2 IMPLANT
DRAPE POUCH INSTRU U-SHP 10X18 (DRAPES) ×2 IMPLANT
DRAPE SURG 17X11 SM STRL (DRAPES) ×2 IMPLANT
DRSG ADAPTIC 3X8 NADH LF (GAUZE/BANDAGES/DRESSINGS) ×2 IMPLANT
DRSG EMULSION OIL 3X16 NADH (GAUZE/BANDAGES/DRESSINGS) ×1 IMPLANT
DRSG PAD ABDOMINAL 8X10 ST (GAUZE/BANDAGES/DRESSINGS) ×2 IMPLANT
DURAPREP 26ML APPLICATOR (WOUND CARE) ×2 IMPLANT
ELECT BLADE TIP CTD 4 INCH (ELECTRODE) ×2 IMPLANT
ELECT REM PT RETURN 9FT ADLT (ELECTROSURGICAL) ×2
ELECTRODE REM PT RTRN 9FT ADLT (ELECTROSURGICAL) ×1 IMPLANT
GLOVE BIO SURGEON STRL SZ8 (GLOVE) ×4 IMPLANT
GLOVE BIOGEL PI IND STRL 8 (GLOVE) ×1 IMPLANT
GLOVE BIOGEL PI INDICATOR 8 (GLOVE) ×1
GLOVE ECLIPSE 8.0 STRL XLNG CF (GLOVE) ×2 IMPLANT
GOWN STRL REIN XL XLG (GOWN DISPOSABLE) ×2 IMPLANT
KIT BASIN OR (CUSTOM PROCEDURE TRAY) ×2 IMPLANT
KIT POSITIONING SURG ANDREWS (MISCELLANEOUS) ×1 IMPLANT
MANIFOLD NEPTUNE II (INSTRUMENTS) ×2 IMPLANT
NDL SPNL 18GX3.5 QUINCKE PK (NEEDLE) ×3 IMPLANT
NEEDLE SPNL 18GX3.5 QUINCKE PK (NEEDLE) ×6 IMPLANT
NS IRRIG 1000ML POUR BTL (IV SOLUTION) ×2 IMPLANT
PATTIES SURGICAL .5 X.5 (GAUZE/BANDAGES/DRESSINGS) IMPLANT
PATTIES SURGICAL .75X.75 (GAUZE/BANDAGES/DRESSINGS) ×1 IMPLANT
PATTIES SURGICAL 1X1 (DISPOSABLE) IMPLANT
POSITIONER SURGICAL ARM (MISCELLANEOUS) ×2 IMPLANT
SPONGE GAUZE 4X4 STERILE 39 (GAUZE/BANDAGES/DRESSINGS) ×1 IMPLANT
SPONGE LAP 4X18 X RAY DECT (DISPOSABLE) ×2 IMPLANT
SPONGE SURGIFOAM ABS GEL 100 (HEMOSTASIS) ×2 IMPLANT
STAPLER VISISTAT 35W (STAPLE) ×1 IMPLANT
SUT VIC AB 1 CT1 27 (SUTURE) ×6
SUT VIC AB 1 CT1 27XBRD ANTBC (SUTURE) ×2 IMPLANT
SUT VIC AB 2-0 CT1 27 (SUTURE) ×4
SUT VIC AB 2-0 CT1 27XBRD (SUTURE) ×2 IMPLANT
TAPE HYPAFIX 4 X10 (GAUZE/BANDAGES/DRESSINGS) ×1 IMPLANT
TOWEL OR 17X26 10 PK STRL BLUE (TOWEL DISPOSABLE) ×4 IMPLANT
TRAY LAMINECTOMY (CUSTOM PROCEDURE TRAY) ×2 IMPLANT
WATER STERILE IRR 1500ML POUR (IV SOLUTION) ×2 IMPLANT

## 2011-11-20 NOTE — Op Note (Signed)
NAMEIZABELLA, MARCANTEL NO.:  [id number]  MEDICAL RECORD NO.:  [id number]  LOCATION:  [facility]                         FACILITY:  [facility]  PHYSICIAN:  Marlowe Kays, M.D.  DATE OF BIRTH:  09/22/1923  DATE OF PROCEDURE:  11/20/2011 DATE OF DISCHARGE:                              OPERATIVE REPORT   PREOPERATIVE DIAGNOSIS:  Spinal stenosis L2-L3, L3-L4, L4-L5, and L5-S1.  POSTOPERATIVE DIAGNOSIS:  Spinal stenosis L2-L3, L3-L4, L4-L5, and L5- S1.  OPERATION:  Central and foraminal decompression, L2 to sacrum.  SURGEON:  Marlowe Kays, M.D.  ASSISTANT:  Georges Lynch. Darrelyn Hillock, M.D.  ANESTHESIA:  General.  PATHOLOGY AND JUSTIFICATION FOR PROCEDURE:  She has a progressive back and bilateral leg pain, difficulty with walking.  An MRI performed on September 12, 2011, demonstrated central and foraminal stenosis at all 4 levels.  Consequently, she is here today for the above-mentioned surgery.  She has failed nonsurgical treatment.  PROCEDURE:  Prophylactic antibiotics, satisfied general anesthesia, Foley catheter inserted, prone position on the Wilson frame.  Back was prepped with DuraPrep, draped in sterile field.  Time-out performed.  I made a central incision carrying it down to the underlying spinous processes, and roughly we felt we had opened the back from L5 to L2.  I dissected soft tissue off the neural arches on either side, placed self- retaining McCullough retractors.  Then, took a lateral x-ray with Kocher clamps on the most proximal and distal spinous processes, and these were identified radiographically, was being on L2 and on L5.  Accordingly, no further extension of incision was required.  After completing clearance of soft tissue off the neural arches, I then used double-action rongeur to remove a major portion of the spinous processes of L2 and all spinous processes distally as well as a portion of the neural arches.  We then brought in the microscope and  did the finer decompression work, working both centrally and laterally.  Her primary compression appeared to be at L4-5.  When we were satisfied both proximally, distally, and laterally that we had completed the decompression, I used hockey-stick to check the foramina at each level bilaterally and there were all patent.  I then irrigated the wound well with sterile saline and placed Gelfoam soaked in thrombin over the dura.  Then, closed the wound with interrupted #1 Vicryl in the paralumbar muscle and fascia leaving about a cm open distally for egress of fluids.  Subcutaneous tissue was closed with 2-0 Vicryl, staples on the skin.  Betadine, Adaptic, dry sterile dressing were applied.  She tolerated the procedure well, was taken to recovery room in satisfactory condition, with no known complications.  Estimated blood loss was perhaps 150 cc.  No blood replacement.          ______________________________ Marlowe Kays, M.D.     JA/MEDQ  D:  11/20/2011  T:  11/20/2011  Job:  [id number]

## 2011-11-20 NOTE — H&P (Signed)
[name] is an 78 y.o. female.   Chief Complaintback and leg pain HPI: *myelogram demonstrates spinal stenosis L2-sacrum*  Past Medical History  Diagnosis Date  . Hypertension   . Hypothyroidism   . GERD (gastroesophageal reflux disease)     Past Surgical History  Procedure Date  . Tonsillectomy   . Appendectomy   . Abdominal hysterectomy   . Other surgical history     left wrist surgery - has plate in left wrist   . Other surgical history     right knee surgery due to torn cartilage     History reviewed. No pertinent family history. Social History:  reports that she has never smoked. She has never used smokeless tobacco. She reports that she does not drink alcohol or use illicit drugs.  Allergies:  Allergies  Allergen Reactions  . Codeine Other (See Comments)    Nightmares, imagining things    Medications Prior to Admission  Medication Dose Route Frequency Provider Last Rate Last Dose  . 0.9 %  sodium chloride infusion   Intravenous Continuous Lottie Dawson III, PA      . ceFAZolin (ANCEF) IVPB 1 g/50 mL premix  1 g Intravenous 60 min Pre-Op Lottie Dawson III, PA      . chlorhexidine (HIBICLENS) 4 % liquid 4 application  60 mL Topical Once Lottie Dawson III, PA      . povidone-iodine (BETADINE) 7.5 % scrub   Topical Once Lottie Dawson III, PA       Medications Prior to Admission  Medication Sig Dispense Refill  . amLODipine (NORVASC) 10 MG tablet Take 10 mg by mouth daily before breakfast.       . levothyroxine (SYNTHROID, LEVOTHROID) 50 MCG tablet Take 50 mcg by mouth daily before breakfast.       . nadolol (CORGARD) 80 MG tablet Take 80 mg by mouth daily before breakfast.       . omeprazole (PRILOSEC) 20 MG capsule Take 20 mg by mouth daily before breakfast.       . traMADol (ULTRAM) 50 MG tablet Take 50 mg by mouth every 6 (six) hours as needed. For pain. Maximum dose= 8 tablets per day      . Vitamin D, Ergocalciferol, (DRISDOL) 50000  UNITS CAPS Take 50,000 Units by mouth every 7 (seven) days. Mondays.        Results for orders placed during the hospital encounter of 11/18/11 (from the past 48 hour(s))  SURGICAL PCR SCREEN     Status: Normal   Collection Time   11/18/11  1:15 PM      Component Value Range Comment   MRSA, PCR NEGATIVE  NEGATIVE     Staphylococcus aureus NEGATIVE  NEGATIVE    CBC     Status: Normal   Collection Time   11/18/11  1:25 PM      Component Value Range Comment   WBC 7.6  4.0 - 10.5 (K/uL)    RBC 4.26  3.87 - 5.11 (MIL/uL)    Hemoglobin 12.7  12.0 - 15.0 (g/dL)    HCT 16.1  09.6 - 04.5 (%)    MCV 90.6  78.0 - 100.0 (fL)    MCH 29.8  26.0 - 34.0 (pg)    MCHC 32.9  30.0 - 36.0 (g/dL)    RDW 40.9  81.1 - 91.4 (%)    Platelets 301  150 - 400 (K/uL)   BASIC METABOLIC PANEL     Status: Abnormal  Collection Time   11/18/11  1:25 PM      Component Value Range Comment   Sodium 141  135 - 145 (mEq/L)    Potassium 3.9  3.5 - 5.1 (mEq/L)    Chloride 105  96 - 112 (mEq/L)    CO2 26  19 - 32 (mEq/L)    Glucose, Bld 107 (*) 70 - 99 (mg/dL)    BUN 17  6 - 23 (mg/dL)    Creatinine, Ser 1.47  0.50 - 1.10 (mg/dL)    Calcium 9.4  8.4 - 10.5 (mg/dL)    GFR calc non Af Amer 48 (*) >90 (mL/min)    GFR calc Af Amer 56 (*) >90 (mL/min)    Dg Chest 2 View  11/20/2011  *RADIOLOGY REPORT*  Clinical Data: 78 year old female preoperative study for lumbar surgery.  CHEST - 2 VIEW  Comparison: 12/24/2009 and earlier.  Findings: Stable lung volumes.  Stable cardiac size and mediastinal contours.  Visualized tracheal air column is within normal limits. No pneumothorax, pulmonary edema, pleural effusion or acute pulmonary opacity.  Stable chronic increased interstitial markings. Chronic deformity of the proximal left humerus.  IMPRESSION: No acute cardiopulmonary abnormality. Chronic deformity of the proximal left humerus.  Original Report Authenticated By: Ulla Potash III, M.D.    ROS  Blood pressure 142/79, pulse 67,  temperature 98 F (36.7 C), temperature source Oral, resp. rate 18, SpO2 98.00%. Physical Exam   Assessment/Plan *spinal stenosis L2-sacrum Decompressive laminectomy L2-sacrum**  APLINGTON,JAMES P 11/20/2011, 8:28 AM

## 2011-11-20 NOTE — Transfer of Care (Signed)
Immediate Anesthesia Transfer of Care Note  Patient: [name]  Procedure(s) Performed:  LUMBAR LAMINECTOMY/DECOMPRESSION MICRODISCECTOMY - Decompressive Laminectomy L2 to the Sacrum (X-Ray)  Patient Location: PACU  Anesthesia Type: General  Level of Consciousness: alert  and oriented  Airway & Oxygen Therapy: Patient Spontanous Breathing  Post-op Assessment: Report given to PACU RN, Post -op Vital signs reviewed and stable and Patient moving all extremities X 4  Post vital signs: Reviewed and stable  Complications: No apparent anesthesia complications

## 2011-11-20 NOTE — Anesthesia Preprocedure Evaluation (Addendum)
Anesthesia Evaluation  Patient identified by MRN, date of birth, ID band Patient awake    Reviewed: Allergy & Precautions, H&P , NPO status , Patient's Chart, lab work & pertinent test results  Airway Mallampati: II TM Distance: >3 FB Neck ROM: Full    Dental No notable dental hx.    Pulmonary neg pulmonary ROS,  clear to auscultation  Pulmonary exam normal       Cardiovascular hypertension, Pt. on medications neg cardio ROS Regular Normal    Neuro/Psych Negative Neurological ROS  Negative Psych ROS   GI/Hepatic negative GI ROS, Neg liver ROS, GERD-  Medicated,  Endo/Other  Negative Endocrine ROSHypothyroidism   Renal/GU negative Renal ROS  Genitourinary negative   Musculoskeletal negative musculoskeletal ROS (+)   Abdominal   Peds negative pediatric ROS (+)  Hematology negative hematology ROS (+)   Anesthesia Other Findings   Reproductive/Obstetrics negative OB ROS                          Anesthesia Physical Anesthesia Plan  ASA: II  Anesthesia Plan: General   Post-op Pain Management:    Induction: Intravenous  Airway Management Planned: Oral ETT  Additional Equipment:   Intra-op Plan:   Post-operative Plan: Extubation in OR  Informed Consent: I have reviewed the patients History and Physical, chart, labs and discussed the procedure including the risks, benefits and alternatives for the proposed anesthesia with the patient or authorized representative who has indicated his/her understanding and acceptance.   Dental advisory given  Plan Discussed with: CRNA  Anesthesia Plan Comments:         Anesthesia Quick Evaluation

## 2011-11-20 NOTE — Brief Op Note (Signed)
11/20/2011  11:12 AM  PATIENT:  [name]  78 y.o. female  PRE-OPERATIVE DIAGNOSIS:  Spinal Stenosis L2-sacrum  POST-OPERATIVE DIAGNOSIS:  Spinal Stenosis L2-sacrum  PROCEDURE:  Procedure(s): LUMBAR LAMINECTOMY/DECOMPRESSION  L2-sacrum  SURGEON:  Surgeon(s): James P Aplington Ronald A Gioffre  PHYSICIAN ASSISTANT: Dr Worthy Rancher  ASSISTANTS: nurse   ANESTHESIA:   general  EBL:  Total I/O In: -  Out: 520 [Urine:300; Blood:220]  BLOOD ADMINISTERED:none  DRAINS: none   LOCAL MEDICATIONS USED:  NONE  SPECIMEN:  No Specimen  DISPOSITION OF SPECIMEN:  N/A  COUNTS:  YES  TOURNIQUET:  * No tourniquets in log *  DICTATION: .Other Dictation: Dictation Number [id number]  PLAN OF CARE: Admit to inpatient   PATIENT DISPOSITION:  PACU - hemodynamically stable.

## 2011-11-20 NOTE — Anesthesia Postprocedure Evaluation (Signed)
  Anesthesia Post-op Note  Patient: [name]  Procedure(s) Performed:  LUMBAR LAMINECTOMY/DECOMPRESSION MICRODISCECTOMY - Decompressive Laminectomy L2 to the Sacrum (X-Ray)  Patient Location: PACU  Anesthesia Type: General  Level of Consciousness: awake and alert   Airway and Oxygen Therapy: Patient Spontanous Breathing  Post-op Pain: mild  Post-op Assessment: Post-op Vital signs reviewed, Patient's Cardiovascular Status Stable, Respiratory Function Stable, Patent Airway and No signs of Nausea or vomiting  Post-op Vital Signs: stable  Complications: No apparent anesthesia complications

## 2011-11-21 NOTE — Progress Notes (Signed)
Patient ID: [name], female   DOB: [birth year], 78 y.o.   MRN: [id number] PO day 1--NV inact in legs.  Pain under control.  Will remove foley when ambulatory.  PT to assist.

## 2011-11-21 NOTE — Progress Notes (Signed)
Physical Therapy Evaluation Patient Details Name: [name] MRN: [id number] DOB: 05-29-1923 Today's Date: 11/21/2011 9:50-10:12 EVIIPT Assessment  Clinical Impression Statement: 78 yo previously active lady who has had progressive hip and leg pain for last year found to be related to spinal stenosis.  Pt underwent decompression L2-sacrum 11/20/11 and is having some inital problems with weight shift and foot placement post op.  Pt has good potential to improve in her mobility and reach her goal to d/c to home with family and HHPT if she progresses quickly  over the next few days.  If not, she may benefirt from ST SNF Pt would benefit from acute OT consult.  Problem List: There is no problem list on file for this patient.   Past Medical History:  Past Medical History  Diagnosis Date  . Hypertension   . Hypothyroidism   . GERD (gastroesophageal reflux disease)    Past Surgical History:  Past Surgical History  Procedure Date  . Tonsillectomy   . Appendectomy   . Abdominal hysterectomy   . Other surgical history     left wrist surgery - has plate in left wrist   . Other surgical history     right knee surgery due to torn cartilage     PT Assessment/Plan/Recommendation PT Assessment Clinical Impression Statement: 78 yo previously active lady who has had progressive hip and leg pain for last year found to be related to spinal stenosis.  Pt underwent decompression L2-sacrum 11/20/11 and is having some inital problems with weight shift and foot placement post op.  Pt has good potential to improve in her mobility and reach her goal to d/c to home with family and HHPT if she progresses quickly  over the next few days.  If not, she may benefirt from ST SNF Pt would benefit from acute OT consult. PT Recommendation/Assessment: Patient will need skilled PT in the acute care venue PT Problem List: Decreased activity tolerance;Decreased balance;Decreased mobility;Decreased knowledge of use of  DME;Pain Problem List Comments: problems may be due to pain medicine?? Barriers to Discharge: None PT Therapy Diagnosis : Difficulty walking;Abnormality of gait;Generalized weakness;Acute pain PT Plan PT Frequency: Min 5X/week PT Treatment/Interventions: DME instruction;Gait training;Stair training;Functional mobility training PT Recommendation Recommendations for Other Services: OT consult Follow Up Recommendations: Home health PT (may need ST-SNF if patient does not improve) PT Goals  Acute Rehab PT Goals PT Goal Formulation: With patient Time For Goal Achievement: 2 weeks Pt will go Supine/Side to Sit: with supervision PT Goal: Supine/Side to Sit - Progress: Not met Pt will go Sit to Stand: with supervision PT Goal: Sit to Stand - Progress: Not met Pt will Ambulate: with supervision;51 - 150 feet;with least restrictive assistive device PT Goal: Ambulate - Progress: Not met Pt will Go Up / Down Stairs: 1-2 stairs;with least restrictive assistive device;with rolling walker PT Goal: Up/Down Stairs - Progress: Not met  PT Evaluation Precautions/Restrictions  Precautions Precautions: Back Precaution Booklet Issued: No Precaution Comments: pt acknowledged understanding of back precautions Required Braces or Orthoses: No Restrictions Weight Bearing Restrictions: No Prior Functioning  Home Living Lives With: Alone Receives Help From: Family;Other (Comment) (son and daughter live nearby) Type of Home: House Home Layout: One level Home Access: Stairs to enter Entrance Stairs-Rails: None Entrance Stairs-Number of Steps: 1 Bathroom Toilet:  ("comfort" height) Home Adaptive Equipment: Walker - rolling Additional Comments: Family member recently has a THR Prior Function Level of Independence: Independent with transfers;Independent with homemaking with ambulation;Independent with gait ("I just  took my time") Cognition Cognition Arousal/Alertness: Awake/alert Overall Cognitive  Status: Appears within functional limits for tasks assessed Orientation Level: Oriented X4 Sensation/Coordination Sensation Light Touch: Appears Intact Proprioception: Appears Intact Coordination Gross Motor Movements are Fluid and Coordinated:  (pt had some diffuculty with leg movement in standing/transfe) Extremity Assessment RLE Assessment RLE Assessment: Within Functional Limits LLE Assessment LLE Assessment: Within Functional Limits Mobility (including Balance) Bed Mobility Bed Mobility: Yes Rolling Left: 3: Mod assist Rolling Left Details (indicate cue type and reason): log rolling Supine to Sit: 3: Mod assist Transfers Transfers: Yes Sit to Stand: 1: +2 Total assist;Other (comment) (pt ~ 50 %) Ambulation/Gait Ambulation/Gait Assistance: 1: +2 Total assist (pt ~ 50 % deficits could be due to first time up after OR) Ambulation Distance (Feet): 2 Feet Assistive device: Rolling walker Gait Pattern: Trunk flexed (generally diffuculty with foot placement bilaterally) Gait velocity: limited by dizziness Stairs: No Wheelchair Mobility Wheelchair Mobility: No  Posture/Postural Control Posture/Postural Control: Postural limitations Postural Limitations: large dressing over back surgical site Balance Balance Assessed: No Exercise    End of Session PT - End of Session Equipment Utilized During Treatment:  (RW) Activity Tolerance: Treatment limited secondary to medical complications (Comment) (inital OOB post op) Patient left: in chair;with call bell in reach Nurse Communication: Mobility status for transfers;Mobility status for ambulation General Behavior During Session: [facility] for tasks performed Cognition: [facility] for tasks performed  Donnetta Hail 11/21/2011, 11:48 AM

## 2011-11-21 NOTE — Progress Notes (Signed)
CSW met with pt and family to assist with D/C planning. Pt states she plans to return home following hospitalization. RNCM notified. CSW is available to assist if plan changes and SNF placement is needed.

## 2011-11-21 NOTE — Progress Notes (Signed)
Physical Therapy Treatment Patient Details Name: TAJUANNA BURNETT MRN: [id number] DOB: 01-Jul-1923 Today's Date: 11/21/2011  PT Assessment/Plan  PT - Assessment/Plan Comments on Treatment Session: Pt with fatigue and wooziness after sitting up in chair.  She has diffuculty with upright and foot placement PT Plan: Discharge plan remains appropriate PT Frequency: Min 5X/week Recommendations for Other Services: OT consult Follow Up Recommendations: Home health PT (may need ST-SNF if patient does not improve) Equipment Recommended: None recommended by PT PT Goals  Acute Rehab PT Goals PT Goal Formulation: With patient Time For Goal Achievement: 2 weeks Pt will go Supine/Side to Sit: with supervision PT Goal: Supine/Side to Sit - Progress: Not met Pt will go Sit to Stand: with supervision PT Goal: Sit to Stand - Progress: Not met Pt will Ambulate: with supervision;51 - 150 feet;with least restrictive assistive device PT Goal: Ambulate - Progress: Not met Pt will Go Up / Down Stairs: 1-2 stairs;with least restrictive assistive device;with rolling walker PT Goal: Up/Down Stairs - Progress: Not met  PT Treatment Precautions/Restrictions  Precautions Precautions: Back Precaution Booklet Issued: No Precaution Comments: pt acknowledged understanding of back precautions Required Braces or Orthoses: No Restrictions Weight Bearing Restrictions: No Mobility (including Balance) Bed Mobility Bed Mobility: Yes Rolling Left: 3: Mod assist Rolling Left Details (indicate cue type and reason): log rolling Supine to Sit: 3: Mod assist Sit to Supine: 1: +2 Total assist (pt ~ 50%) Transfers Transfers: Yes Sit to Stand: 1: +2 Total assist (pt ~50%) Sit to Stand Details (indicate cue type and reason): pt needed some assit lifting hips off bed Ambulation/Gait Ambulation/Gait Assistance: 1: +2 Total assist (pt ~50%) Ambulation/Gait Assistance Details (indicate cue type and reason): Pt continues to  have diffuculty standing erect and  placing feet during transfer.  She c/o dizziness and fatigue and is "ready to lie down" Ambulation Distance (Feet): 2 Feet Assistive device: Rolling walker Gait Pattern: Trunk flexed Gait velocity: limited by dizziness Stairs: No Wheelchair Mobility Wheelchair Mobility: No  Posture/Postural Control Posture/Postural Control: Postural limitations Postural Limitations: large dressing over back surgical site Balance Balance Assessed: No Exercise    End of Session PT - End of Session Equipment Utilized During Treatment:  (RW) Activity Tolerance: Treatment limited secondary to medical complications (Comment) (pt c/o dizziness) Patient left: in bed Nurse Communication: Mobility status for transfers General Behavior During Session: [facility] for tasks performed Cognition: [facility] for tasks performed  Donnetta Hail 11/21/2011, 12:23 PM

## 2011-11-22 MED ORDER — TRAMADOL HCL 50 MG PO TABS
50.0000 mg | ORAL_TABLET | Freq: Four times a day (QID) | ORAL | Status: DC
  Administered 2011-11-22 – 2011-11-25 (×13): 50 mg via ORAL
  Filled 2011-11-22 (×21): qty 1

## 2011-11-22 NOTE — Consult Note (Signed)
Physical Medicine and Rehabilitation Consult Reason for Consult: Stenosis with radiculopathy Referring Phsyician: Dr. Kenn File is an 78 y.o. female.   HPI: 78 year old right-handed female with chronic low back pain radiating to lower extremity. Recent myelogram demonstrated spinal stenosis lumbar L2-3, 3-4, 4-5, and lumbar L5-S1 with radiculopathy. Underwent central and foraminal decompression lumbar L2 to sacrum January 9 per Dr. Leslee Home. Postoperative pain control with PCA Dilaudid. No plan for back brace per orthopedic services. Advise routine back precautions. Patient requires minimal assist or roll +2 total assist sit to supine. Ambulating 25 feet with +2 total assist noted ataxic gait. Occupational therapy notes minimal assist upper body dressing max assist lower body dressing. Physical medicine and rehabilitation was consulted to consider inpatient rehabilitation services versus need for skilled nursing facility.  Review of Systems  Musculoskeletal: Positive for back pain.  Skin: Negative.   All other systems reviewed and are negative.   Past Medical History  Diagnosis Date  . Hypertension   . Hypothyroidism   . GERD (gastroesophageal reflux disease)    Past Surgical History  Procedure Date  . Tonsillectomy   . Appendectomy   . Abdominal hysterectomy   . Other surgical history     left wrist surgery - has plate in left wrist   . Other surgical history     right knee surgery due to torn cartilage    History reviewed. No pertinent family history. Social History:  reports that she has never smoked. She has never used smokeless tobacco. She reports that she does not drink alcohol or use illicit drugs. Allergies:  Allergies  Allergen Reactions  . Codeine Other (See Comments)    Nightmares, imagining things   Medications Prior to Admission  Medication Dose Route Frequency Provider Last Rate Last Dose  . amLODipine (NORVASC) tablet 10 mg  10 mg Oral QAC  breakfast James P Aplington   10 mg at 11/22/11 0831  . ceFAZolin (ANCEF) IVPB 1 g/50 mL premix  1 g Intravenous 60 min Pre-Op Lottie Dawson III, PA   1 g at 11/20/11 0830  . ceFAZolin (ANCEF) IVPB 1 g/50 mL premix  1 g Intravenous Q6H James P Aplington   1 g at 11/21/11 0238  . dextrose 5 % and 0.2 % NaCl infusion   Intravenous Continuous James P Aplington 100 mL/hr at 11/22/11 1044    . diphenhydrAMINE (BENADRYL) injection 12.5 mg  12.5 mg Intravenous Q6H PRN James P Aplington       Or  . diphenhydrAMINE (BENADRYL) 12.5 MG/5ML elixir 12.5 mg  12.5 mg Oral Q6H PRN James P Aplington      . docusate sodium (COLACE) capsule 100 mg  100 mg Oral BID James P Aplington   100 mg at 11/22/11 1043  . HYDROmorphone (DILAUDID) injection 0.25-0.5 mg  0.25-0.5 mg Intravenous Q5 min PRN Phillips Grout, MD   0.5 mg at 11/20/11 1139  . HYDROmorphone (DILAUDID) PCA injection 0.3 mg/mL   Intravenous Q4H James P Aplington   0.2 mg at 11/22/11 1141  . lactated ringers infusion   Intravenous Continuous Phillips Grout, MD      . lactated ringers infusion   Intravenous Continuous Melinda Hatchel Sullivan Lone      . levothyroxine (SYNTHROID, LEVOTHROID) tablet 50 mcg  50 mcg Oral QAC breakfast James P Aplington   50 mcg at 11/22/11 0831  . methocarbamol (ROBAXIN) tablet 500 mg  500 mg Oral Q6H PRN James P Aplington       Or  .  methocarbamol (ROBAXIN) 500 mg in dextrose 5 % 50 mL IVPB  500 mg Intravenous Q6H PRN James P Aplington   500 mg at 11/20/11 1158  . metoCLOPramide (REGLAN) tablet 5-10 mg  5-10 mg Oral Q8H PRN James P Aplington       Or  . metoCLOPramide (REGLAN) injection 5-10 mg  5-10 mg Intravenous Q8H PRN James P Aplington      . nadolol (CORGARD) tablet 80 mg  80 mg Oral QAC breakfast James P Aplington   80 mg at 11/22/11 0831  . naloxone [facility]) injection 0.4 mg  0.4 mg Intravenous PRN James P Aplington       And  . sodium chloride 0.9 % injection 9 mL  9 mL Intravenous PRN James P Aplington      .  ondansetron (ZOFRAN) tablet 4 mg  4 mg Oral Q6H PRN James P Aplington       Or  . ondansetron (ZOFRAN) injection 4 mg  4 mg Intravenous Q6H PRN James P Aplington      . ondansetron (ZOFRAN) injection 4 mg  4 mg Intravenous Q6H PRN James P Aplington      . pantoprazole (PROTONIX) EC tablet 40 mg  40 mg Oral Q1200 James P Aplington   40 mg at 11/22/11 1139  . traMADol (ULTRAM) tablet 50 mg  50 mg Oral Q6H James P Aplington   50 mg at 11/22/11 1140  . DISCONTD: 0.9 %  sodium chloride infusion   Intravenous Continuous Lottie Dawson III, PA      . DISCONTD: 0.9 % irrigation (POUR BTL)    PRN Illene Labrador Aplington   1,000 mL at 11/20/11 1009  . DISCONTD: chlorhexidine (HIBICLENS) 4 % liquid 4 application  60 mL Topical Once Lottie Dawson III, PA      . DISCONTD: hemostatic agents    PRN Illene Labrador Aplington   1 application at 11/20/11 1010  . DISCONTD: meperidine (DEMEROL) injection 6.5-12.5 mg  6.5-12.5 mg Intravenous Q5 min PRN Phillips Grout, MD      . DISCONTD: povidone-iodine (BETADINE) 7.5 % scrub   Topical Once Lottie Dawson III, PA      . DISCONTD: promethazine (PHENERGAN) injection 6.25-12.5 mg  6.25-12.5 mg Intravenous Q15 min PRN Phillips Grout, MD   6.25 mg at 11/20/11 1212  . DISCONTD: thrombin spray    PRN Illene Labrador Aplington   10,000 Units at 11/20/11 1008   Medications Prior to Admission  Medication Sig Dispense Refill  . amLODipine (NORVASC) 10 MG tablet Take 10 mg by mouth daily before breakfast.       . levothyroxine (SYNTHROID, LEVOTHROID) 50 MCG tablet Take 50 mcg by mouth daily before breakfast.       . nadolol (CORGARD) 80 MG tablet Take 80 mg by mouth daily before breakfast.       . omeprazole (PRILOSEC) 20 MG capsule Take 20 mg by mouth daily before breakfast.       . traMADol (ULTRAM) 50 MG tablet Take 50 mg by mouth every 6 (six) hours as needed. For pain. Maximum dose= 8 tablets per day      . Vitamin D, Ergocalciferol, (DRISDOL) 50000 UNITS CAPS Take 50,000 Units by  mouth every 7 (seven) days. Mondays.        Home: Home Living Lives With: Alone Receives Help From: Family;Other (Comment) Type of Home: House Home Layout: One level Home Access: Stairs to enter Entrance Stairs-Rails: None Entrance Stairs-Number of Steps: 1  Bathroom Shower/Tub: Psychologist, counselling;Door [employer]:  (comfort height commode.) Bathroom Accessibility: Yes How Accessible: Accessible via walker Home Adaptive Equipment: Walker - rolling;Shower chair with back Additional Comments: Family member recently has a THR  Functional History: Prior Function Level of Independence: Independent with transfers;Independent with homemaking with ambulation;Independent with gait Able to Take Stairs?: Yes Driving: Yes Vocation: Retired Functional Status:  Mobility: Bed Mobility Bed Mobility: Yes Rolling Left: 4: Min assist Rolling Left Details (indicate cue type and reason): log rolling Supine to Sit: 3: Mod assist Supine to Sit Details (indicate cue type and reason): Pt needed cues for log rolling and technique. Sit to Supine: 3: Mod assist Sit to Supine - Details (indicate cue type and reason): assist needed to bring legs onto bed and to log roll to back. Transfers Transfers: Yes Sit to Stand: 4: Min assist Sit to Stand Details (indicate cue type and reason): cues to push up from bed.  Pt wants to pull up on therapist. Ambulation/Gait Ambulation/Gait: Yes Ambulation/Gait Assistance: 1: +2 Total assist (pt ~ 50%) Ambulation/Gait Assistance Details (indicate cue type and reason): Pt had difficulty with coordination of left foot.  She made an exaggerated step and heel strike and needed encouragement to step through with right leg. Pt fatigued at end of walk, but better tolerance than earlier Ambulation Distance (Feet): 25 Feet Assistive device: Rolling walker Gait Pattern: Step-to pattern;Ataxic (with left leg) Gait velocity: slow Stairs: No Wheelchair Mobility Wheelchair  Mobility: No  ADL: ADL Eating/Feeding: Simulated;Independent Where Assessed - Eating/Feeding: Edge of bed Grooming: Performed;Teeth care;Wash/dry hands;Set up Where Assessed - Grooming: Sitting, bed Upper Body Bathing: Simulated;Set up Where Assessed - Upper Body Bathing: Sitting, chair Lower Body Bathing: Simulated;Moderate assistance Lower Body Bathing Details (indicate cue type and reason): assist transferring sit to stand and maintaining standing. assist to reach below knees.  Instructed pt to bring legs to her and not reach to the floor. Where Assessed - Lower Body Bathing: Sit to stand from chair Upper Body Dressing: Minimal assistance;Simulated Upper Body Dressing Details (indicate cue type and reason): min assist to reach back to put arm through armhole. Where Assessed - Upper Body Dressing: Sitting, bed Lower Body Dressing: Maximal assistance;Simulated Lower Body Dressing Details (indicate cue type and reason): assist for technique to maintain back precautions.  pt may or may not need AE.  Will continue to monitor.  Pt was able to cross R and L leg  over knee. Where Assessed - Lower Body Dressing: Sit to stand from chair Toilet Transfer: Performed;Moderate assistance Toilet Transfer Details (indicate cue type and reason): pt with decreased balance and became dizzy standing. Toilet Transfer Method: Surveyor, minerals: Set designer - Clothing Manipulation: Simulated;+1 Total assistance Toileting - Clothing Manipulation Details (indicate cue type and reason): Pt unable to let go of walker to manage clothes. Where Assessed - Toileting Clothing Manipulation: Standing Toileting - Hygiene: Minimal assistance;Simulated Toileting - Hygiene Details (indicate cue type and reason): cues not to twist when cleaning self. Where Assessed - Toileting Hygiene: Sit to stand from 3-in-1 or toilet Tub/Shower Transfer: Not assessed Equipment Used: Rolling  walker Ambulation Related to ADLs: No ambulation performed on eval. Pt fatigued from PT. ADL Comments: Pt doing well after big back surgery.  pt has difficulty knowing where her L leg is during tranfers/ADLs.  Cognition: Cognition Arousal/Alertness: Awake/alert Orientation Level: Oriented X4 Cognition Arousal/Alertness: Awake/alert Overall Cognitive Status: Appears within functional limits for tasks assessed Orientation Level: Oriented X4  Blood pressure  114/64, pulse 75, temperature 98.5 F (36.9 C), temperature source Oral, resp. rate 18, height 5\' 7"  (1.702 m), weight 73 kg (160 lb 15 oz), SpO2 96.00%. Physical Exam  Constitutional: She is oriented to person, place, and time. She appears well-developed.  HENT:  Head: Normocephalic and atraumatic.  Eyes: Pupils are equal, round, and reactive to light.  Neck: Normal range of motion.  Cardiovascular: Normal rate.   Pulmonary/Chest: Effort normal.  Abdominal: Soft.  Musculoskeletal:       Back incision dressed without visible drainage.  Low back appropriately tender.  SLR negative  Neurological: She is alert and oriented to person, place, and time. She has normal reflexes. No cranial nerve deficit or sensory deficit.       Pt's alert and appropriate.  Cognitively intact.  UE strength grossly 4/5.  LE strength 3/5 proximally to 4/5 distally.  Pt with some pain inhibition with proximal LE movement.  Fair to good bed mobility. Some impairment in [facility] of LLE while in the supine position.    No results found for this or any previous visit (from the past 24 hour(s)). No results found.  Assessment/Plan: Diagnosis: lumbar stenosis s/p central and lateral decompression 1. Does the need for close, 24 hr/day medical supervision in concert with the patient's rehab needs make it unreasonable for this patient to be served in a less intensive setting? Potentially 2. Co-Morbidities requiring supervision/potential complications: pain, htn, wound  care 3. Due to bladder management, bowel management, safety, skin/wound care, medication administration and pain management, does the patient require 24 hr/day rehab nursing? Potentially 4. Does the patient require coordinated care of a physician, rehab nurse, PT (1-2 hrs/day, 5 days/week) and OT (1-2 hrs/day, 5 days/week) to address physical and functional deficits in the context of the above medical diagnosis(es)? Potentially Addressing deficits in the following areas: balance, bathing, bowel/bladder control, dressing, endurance, locomotion, strength, toileting and transferring 5. Can the patient actively participate in an intensive therapy program of at least 3 hrs of therapy per day at least 5 days per week? Potentially 6. The potential for patient to make measurable gains while on inpatient rehab is good and fair 7. Anticipated functional outcomes upon discharge from inpatients are modified independent PT, modified independent to supervision OT 8. Estimated rehab length of stay to reach the above functional goals is: ?less than a week 9. Does the patient have adequate social supports to accommodate these discharge functional goals? Yes 10. Anticipated D/C setting: Home 11. Anticipated post D/C treatments: Outpt therapy 12. Overall Rehab/Functional Prognosis: excellent  RECOMMENDATIONS: This patient's condition is appropriate for continued rehabilitative care in the following setting: HH (vs CIR) Patient has agreed to participate in recommended program. Yes and Potentially Note that insurance prior authorization may be required for reimbursement for recommended care.  Comment: It's only post op day #2. She's already showing improvement in her pain, and she's likely to improve further functionally over the weekend as well.  I don't see much that will justify medical necessity for inpatient rehab either. We will follow along.   Ivory Broad, MD 11/22/2011

## 2011-11-22 NOTE — Progress Notes (Signed)
Occupational Therapy Evaluation Patient Details Name: BLANDINA RENALDO MRN: [id number] DOB: [birth year] Today's Date: 11/22/2011  Problem List: There is no problem list on file for this patient.   Past Medical History:  Past Medical History  Diagnosis Date  . Hypertension   . Hypothyroidism   . GERD (gastroesophageal reflux disease)    Past Surgical History:  Past Surgical History  Procedure Date  . Tonsillectomy   . Appendectomy   . Abdominal hysterectomy   . Other surgical history     left wrist surgery - has plate in left wrist   . Other surgical history     right knee surgery due to torn cartilage     OT Assessment/Plan/Recommendation OT Assessment Clinical Impression Statement: Pt s/p L2 to sacral laminectomy and decompression with deficits in areas of balance, mobility and ability to reach LEs for adls affecting independence with basic adls.  Pt would benefit from cont OT to increase I with adls. OT Recommendation/Assessment: Patient will need skilled OT in the acute care venue OT Problem List: Decreased activity tolerance;Decreased coordination;Decreased knowledge of use of DME or AE;Decreased knowledge of precautions;Pain Barriers to Discharge: Decreased caregiver support Barriers to Discharge Comments: Pt lives alone...son next door but not always available. OT Therapy Diagnosis : Generalized weakness;Acute pain;Ataxia (ataxia in LLE only) OT Plan OT Frequency: Min 2X/week OT Treatment/Interventions: Self-care/ADL training;DME and/or AE instruction;Therapeutic activities OT Recommendation Recommendations for Other Services: Rehab consult Follow Up Recommendations: Inpatient Rehab Equipment Recommended: Defer to next venue Individuals Consulted Consulted and Agree with Results and Recommendations: Family member/caregiver Family Member Consulted: sons OT Goals Acute Rehab OT Goals OT Goal Formulation: With patient/family Time For Goal Achievement: 2 weeks ADL  Goals Pt Will Perform Grooming: with supervision;Standing at sink ADL Goal: Grooming - Progress: Progressing toward goals Pt Will Perform Lower Body Bathing: with supervision;Sitting at sink;Sit to stand from chair ADL Goal: Lower Body Bathing - Progress: Progressing toward goals Pt Will Perform Lower Body Dressing: with supervision;Sit to stand from chair ADL Goal: Lower Body Dressing - Progress: Progressing toward goals Pt Will Perform Tub/Shower Transfer: with supervision;Shower seat with back;Shower transfer ADL Goal: Web designer - Progress: Progressing toward goals Additional ADL Goal #1: Pt will complete all aspects of toileting with supervision. ADL Goal: Additional Goal #1 - Progress: Progressing toward goals  OT Evaluation Precautions/Restrictions  Precautions Precautions: Back Precaution Booklet Issued: No Precaution Comments: pt acknowledged understanding of back precautions Required Braces or Orthoses: No Restrictions Weight Bearing Restrictions: No Prior Functioning Home Living Lives With: Alone Receives Help From: Family;Other (Comment) Type of Home: House Home Layout: One level Home Access: Stairs to enter Entrance Stairs-Rails: None Entrance Stairs-Number of Steps: 1 Bathroom Shower/Tub: Walk-in shower;Door [employer]:  (comfort height commode.) Bathroom Accessibility: Yes How Accessible: Accessible via walker Home Adaptive Equipment: Walker - rolling;Shower chair with back Additional Comments: Family member recently has a THR Prior Function Level of Independence: Independent with transfers;Independent with homemaking with ambulation;Independent with gait Able to Take Stairs?: Yes Driving: Yes Vocation: Retired ADL ADL Eating/Feeding: Simulated;Independent Where Assessed - Eating/Feeding: Edge of bed Grooming: Performed;Teeth care;Wash/dry hands;Set up Where Assessed - Grooming: Sitting, bed Upper Body Bathing: Simulated;Set up Where  Assessed - Upper Body Bathing: Sitting, chair Lower Body Bathing: Simulated;Moderate assistance Lower Body Bathing Details (indicate cue type and reason): assist transferring sit to stand and maintaining standing. assist to reach below knees.  Instructed pt to bring legs to her and not reach to the floor. Where Assessed - Lower  Body Bathing: Sit to stand from chair Upper Body Dressing: Minimal assistance;Simulated Upper Body Dressing Details (indicate cue type and reason): min assist to reach back to put arm through armhole. Where Assessed - Upper Body Dressing: Sitting, bed Lower Body Dressing: Maximal assistance;Simulated Lower Body Dressing Details (indicate cue type and reason): assist for technique to maintain back precautions.  pt may or may not need AE.  Will continue to monitor.  Pt was able to cross R and L leg  over knee. Where Assessed - Lower Body Dressing: Sit to stand from chair Toilet Transfer: Performed;Moderate assistance Toilet Transfer Details (indicate cue type and reason): pt with decreased balance and became dizzy standing. Toilet Transfer Method: Surveyor, minerals: Set designer - Clothing Manipulation: Simulated;+1 Total assistance Toileting - Clothing Manipulation Details (indicate cue type and reason): Pt unable to let go of walker to manage clothes. Where Assessed - Toileting Clothing Manipulation: Standing Toileting - Hygiene: Minimal assistance;Simulated Toileting - Hygiene Details (indicate cue type and reason): cues not to twist when cleaning self. Where Assessed - Toileting Hygiene: Sit to stand from 3-in-1 or toilet Tub/Shower Transfer: Not assessed Equipment Used: Rolling walker Ambulation Related to ADLs: No ambulation performed on eval. Pt fatigued from PT. ADL Comments: Pt doing well after big back surgery.  pt has difficulty knowing where her L leg is during tranfers/ADLs. Vision/Perception     Cognition Cognition Orientation Level: Oriented X4 Sensation/Coordination Sensation Light Touch: Appears Intact Proprioception:  (unsure with LLE if pt has proprioceptive problems or ataxia) Coordination Gross Motor Movements are Fluid and Coordinated: No Fine Motor Movements are Fluid and Coordinated: Yes Coordination and Movement Description: Pt with mild problems controlling movement of LLE Extremity Assessment RUE Assessment RUE Assessment: Within Functional Limits LUE Assessment LUE Assessment:  (Pt with old L shoulder fx but at baseline and ROM WFL>) Mobility  Bed Mobility Bed Mobility: Yes Rolling Left: 4: Min assist Supine to Sit: 3: Mod assist Supine to Sit Details (indicate cue type and reason): Pt needed cues for log rolling and technique. Sit to Supine: 3: Mod assist Sit to Supine - Details (indicate cue type and reason): assist needed to bring legs onto bed and to log roll to back. Transfers Transfers: Yes Sit to Stand: 4: Min assist Sit to Stand Details (indicate cue type and reason): cues to push up from bed.  Pt wants to pull up on therapist. Exercises Other Exercises Other Exercises: standing isometrics to abds and glutes Other Exercises: stepping and weight shifting with emphasis on postural activation and control End of Session OT - End of Session Activity Tolerance: Patient tolerated treatment well Patient left: in bed;with call bell in reach;with family/visitor present Nurse Communication: Mobility status for transfers General Behavior During Session: [facility] for tasks performed Cognition: [facility] for tasks performed   Hope Budds [phone] 11/22/2011, 12:14 PM

## 2011-11-22 NOTE — Progress Notes (Addendum)
Physical Therapy Treatment Patient Details Name: SHAKINAH NAVIS MRN: [id number] DOB: [birth year] Today's Date: 11/22/11 8:30-9:03 2TA  PT Assessment/Plan  PT - Assessment/Plan Comments on Treatment Session: pt with increased pain with moblitiy, decreased tolerance to activity continues. orthostasis in upright PT Plan: Discharge plan remains appropriate (may need to consider short term SNF) PT Frequency: Min 5X/week Follow Up Recommendations: Home health PT Equipment Recommended: None recommended by PT PT Goals  Acute Rehab PT Goals PT Goal: Supine/Side to Sit - Progress: Progressing toward goal PT Goal: Sit to Stand - Progress: Progressing toward goal PT Goal: Ambulate - Progress: Not met  PT Treatment Precautions/Restrictions  Precautions Precautions: Back Precaution Booklet Issued: No Precaution Comments: pt acknowledged understanding of back precautions Required Braces or Orthoses: No Restrictions Weight Bearing Restrictions: No Mobility (including Balance) Bed Mobility Rolling Left: 4: Min assist Supine to Sit: 3: Mod assist Transfers Sit to Stand: 3: Mod assist Sit to Stand Details (indicate cue type and reason): constant verbal cues Ambulation/Gait Ambulation/Gait Assistance: 1: +2 Total assist (pt ~ 50%) Ambulation/Gait Assistance Details (indicate cue type and reason): during attempt to weight shift and move back to chair, pt had major balance loss to left side requiring therapist to block her from falling. Pt unaware fo what was going on. pt wil increased pain.  Reported to RN.  pt in chair.  orthostasis noted.  After severl minutes , pt blood pressure normalized and pt was able to eat breakfast Assistive device: Rolling walker Gait Pattern: Decreased step length - right;Decreased step length - left;Decreased dorsiflexion - left;Decreased hip/knee flexion - right;Trunk flexed Gait velocity: slow, decreased postural control.. Unable to advance L/E's  Posture/Postural  Control Posture/Postural Control: Postural limitations Postural Limitations: decreased postureal control due to?  orthostasis? Exercise  Other Exercises Other Exercises: standing isometrics to abds and glutes Other Exercises: stepping and weight shifting with emphasis on postural activation and control End of Session PT - End of Session Equipment Utilized During Treatment: Gait belt (RW) Activity Tolerance: Treatment limited secondary to medical complications (Comment) (orthostasis) Patient left: in chair Nurse Communication: Mobility status for transfers General Behavior During Session: [facility] for tasks performed Cognition: [facility] for tasks performed  Donnetta Hail 11/22/2011, 9:46 AM

## 2011-11-22 NOTE — Progress Notes (Signed)
Patient ID: [name], female   DOB: 1923-07-20, 78 y.o.   MRN: [id number] PO day 2.  Low grade fever t0 100.1 last night.  Slow progress in pt.  Will probably require sev more days in hosp.  OT ordered.

## 2011-11-22 NOTE — Progress Notes (Signed)
Physical Therapy Treatment Patient Details Name: [name] MRN: [id number] DOB: 12-05-1922 Today's Date: 11/22/2011 9:40-10:00 G  PT Assessment/Plan  PT - Assessment/Plan Comments on Treatment Session: Pt is displaying some symptoms with left leg due to ??  Anticipate pt will need more skilled PT with 24/7 care at D/C.  Discussed options of CIR vs SNF with pt and  her 2 sons.  Pt would like to investigate CIR stay.  Feel that pt would likely be able to tolerate 3 hours of therapy by Monday admission day if possilbe.  Discussed case with Roderic Palau, case manager. and asked Selena Batten, RN to get a  rehab consul. PT Plan: Discharge plan needs to be updated PT Frequency: Min 5X/week Recommendations for Other Services: Rehab consult Follow Up Recommendations: Inpatient Rehab Equipment Recommended: Defer to next venue PT Goals  Acute Rehab PT Goals PT Goal: Supine/Side to Sit - Progress: Progressing toward goal PT Goal: Sit to Stand - Progress: Progressing toward goal PT Goal: Ambulate - Progress: Progressing toward goal  PT Treatment Precautions/Restrictions  Precautions Precautions: Back Precaution Booklet Issued: No Precaution Comments: pt acknowledged understanding of back precautions Required Braces or Orthoses: No Restrictions Weight Bearing Restrictions: No Mobility (including Balance) Bed Mobility Rolling Left: 4: Min assist Supine to Sit: 3: Mod assist Sit to Supine: 1: +2 Total assist (pt ~50%) Sit to Supine - Details (indicate cue type and reason): pt needed assist at shoulders and legs Transfers Sit to Stand: 4: Min assist Sit to Stand Details (indicate cue type and reason): cues to push off from chair.  Pt feels better after sitting up in chair and is able to participate better with PT Ambulation/Gait Ambulation/Gait: Yes Ambulation/Gait Assistance: 1: +2 Total assist (pt ~ 50%) Ambulation/Gait Assistance Details (indicate cue type and reason): Pt had difficulty with  coordination of left foot.  She made an exaggerated step and heel strike and needed encouragement to step through with right leg. Pt fatigued at end of walk, but better tolerance than earlier Ambulation Distance (Feet): 25 Feet Assistive device: Rolling walker Gait Pattern: Step-to pattern;Ataxic (with left leg) Gait velocity: slow Stairs: No Wheelchair Mobility Wheelchair Mobility: No  Posture/Postural Control Posture/Postural Control: Postural limitations Postural Limitations: decreased postureal control due to?  orthostasis? Exercise  Other Exercises Other Exercises: standing isometrics to abds and glutes Other Exercises: stepping and weight shifting with emphasis on postural activation and control End of Session PT - End of Session Equipment Utilized During Treatment: Gait belt (RW) Activity Tolerance: Patient limited by fatigue Patient left: in bed Nurse Communication: Mobility status for transfers;Mobility status for ambulation General Behavior During Session: [facility] for tasks performed Cognition: [facility] for tasks performed  Donnetta Hail 11/22/2011, 10:43 AM

## 2011-11-23 NOTE — Progress Notes (Signed)
Physical Therapy Treatment Patient Details Name: [name] MRN: [id number] DOB: Mar 21, 1923 Today's Date: 11/23/2011 Time: 4098-1191  1G PT Assessment/Plan  PT - Assessment/Plan Comments on Treatment Session: Pt continuing to progress. Able to increase ambulation distance this session. No c/o dizziness. Pain well controlled PT Plan: Discharge plan remains appropriate Recommendations for Other Services: Rehab consult Follow Up Recommendations: Inpatient Rehab (if CIR not option, may need to consider SNF) Equipment Recommended: Defer to next venue PT Goals  Acute Rehab PT Goals PT Goal: Supine/Side to Sit - Progress: Progressing toward goal PT Goal: Sit to Stand - Progress: Progressing toward goal PT Goal: Ambulate - Progress: Progressing toward goal  PT Treatment Precautions/Restrictions  Precautions Precautions: Back Precaution Booklet Issued: No Precaution Comments: pt acknowledged understanding of back precautions Required Braces or Orthoses: No Restrictions Weight Bearing Restrictions: No Mobility (including Balance) Bed Mobility Bed Mobility: Yes Rolling Left: 4: Min assist Rolling Left Details (indicate cue type and reason): Assist to complete roll. VCs safety, technique. Left Sidelying to Sit: 4: Min assist Left Sidelying to Sit Details (indicate cue type and reason): VCs safety, technique. Assist for trunk to upright.  Transfers Transfers: Yes Sit to Stand: 4: Min assist;From bed;From chair/3-in-1;With upper extremity assist;With armrests Sit to Stand Details (indicate cue type and reason): VCs safety, technique, hand placement. Assist to rise, stabilize.  Stand to Sit: 4: Min assist Stand to Sit Details: VCs safety, technique, hand placement. Assist to control descent.  Ambulation/Gait Ambulation/Gait: Yes Ambulation/Gait Assistance: 4: Min assist Ambulation/Gait Assistance Details (indicate cue type and reason): VCs safety, techniquce. Slow gait speed. L LE  ataxia noted still. Pt with difficutly placing L foot during gait.  Ambulation Distance (Feet): 90 Feet Assistive device: Rolling walker Gait Pattern: Step-to pattern;Decreased stride length;Ataxic    Exercise    End of Session PT - End of Session Equipment Utilized During Treatment: Gait belt Activity Tolerance: Patient tolerated treatment well Patient left: in chair;with call bell in reach General Behavior During Session: [facility] for tasks performed Cognition: [facility] for tasks performed  Rebeca Alert [facility] 11/23/2011, 11:45 AM [phone]

## 2011-11-23 NOTE — Progress Notes (Signed)
[name]  MRN: [id number] DOB/Age: Mar 05, 1923 78 y.o. Physician: Lynnea Maizes, M.D. 3 Days Post-Op Procedure(s) (LRB): LUMBAR LAMINECTOMY/DECOMPRESSION MICRODISCECTOMY (N/A)  Subjective: I feel much better today. +flatus, no BM, good appetite Vital Signs Temp:  [98.5 F (36.9 C)-99.8 F (37.7 C)] 99.1 F (37.3 C) (01/12 0627) Pulse Rate:  [71-78] 71  (01/12 0627) Resp:  [16-18] 16  (01/12 0838) BP: (112-118)/(60-64) 112/60 mmHg (01/12 0627) SpO2:  [92 %-98 %] 98 % (01/12 0838) FiO2 (%):  [91 %] 91 % (01/11 2347)  Lab Results No results found for this basename: WBC:2,HGB:2,HCT:2,PLT:2 in the last 72 hours BMET No results found for this basename: NA:2,K:2,CL:2,CO2:2,GLUCOSE:2,BUN:2,CREATININE:2,CALCIUM:2 in the last 72 hours No results found for this basename: inr     Exam  Comfortable in bed, A + O. Moves LE's well  Plan D/C foley, SL iv, discussed discharge plans. Mobilize with PT [name] M 11/23/2011, 10:35 AM

## 2011-11-24 NOTE — Progress Notes (Signed)
Met with patient and her son at bedside- discussed possible need for inpatient rehab prior to d/c home- Patient states she initially thought she would go straight home but now acknowledges that the weakness in her Left LE and the pain may require a short inpatient stay. Discussed CIR which is already evaluating her for possible admission- as well as ST-SNF.  She would strongly prefer CIR and feels she can tolerate the intensity of CIR. She has a son who lives next door on the property and a daughter around the corner- they feel if she needed 24 hr Supervision at d/c they caould provide this. Patient and son are also open to SNF if needed- CIR to f/u on Monday for hopeful admit to Rehab.  Will proceed with SNF search for a back up plan. Reece Levy, MSW, LCSWA [phone]/weekend coverage

## 2011-11-24 NOTE — Progress Notes (Signed)
Patient has not voided since her catheter was removed last evening. Bladder scan shows 301 ml urine present. Denies being uncomfortable at this time. Patient aware to inform staff if this changes. Will continue to assess.

## 2011-11-24 NOTE — Progress Notes (Signed)
[name]  MRN: [id number] DOB/Age: 76-Feb-1924 76 y.o. Physician: Jacquelyne Balint Procedure: Procedure(s) (LRB): LUMBAR LAMINECTOMY/DECOMPRESSION MICRODISCECTOMY (N/A)     Subjective: In bed, has ambulated short distances only in hall.meeting with case mgr. Prefers short stay in rehab  Vital Signs Temp:  [98.3 F (36.8 C)-99.8 F (37.7 C)] 98.3 F (36.8 C) (01/13 0430) Pulse Rate:  [70-76] 70  (01/13 0430) Resp:  [17-20] 19  (01/13 0430) BP: (104-115)/(65-72) 115/72 mmHg (01/13 0430) SpO2:  [93 %-96 %] 95 % (01/13 0430)  Lab Results No results found for this basename: WBC:2,HGB:2,HCT:2,PLT:2 in the last 72 hours BMET No results found for this basename: NA:2,K:2,CL:2,CO2:2,GLUCOSE:2,BUN:2,CREATININE:2,CALCIUM:2 in the last 72 hours No results found for this basename: inr     Exam NVI to bilat LE. Sensation intact        Plan Hopeful for rehab tom. Continue mobilize DC IV  Adara Kittle for Dr.Kevin Supple 11/24/2011, 9:44 AM

## 2011-11-24 NOTE — Progress Notes (Signed)
Physical Therapy Treatment Patient Details Name: [name] MRN: [id number] DOB: 05-13-1923 Today's Date: 11/24/2011  PT Assessment/Plan  PT - Assessment/Plan Comments on Treatment Session: Patient continues slow progress, but does have dynamic impairment of left L/E which requires 24/7 assist for  safety at D/C.  Pt. willing to investigate ST SNF if CIR not available.  PT Plan: Discharge plan remains appropriate PT Frequency: Min 5X/week Equipment Recommended: Defer to next venue PT Goals  Acute Rehab PT Goals PT Goal: Sit to Stand - Progress: Progressing toward goal PT Goal: Ambulate - Progress: Progressing toward goal  PT Treatment Precautions/Restrictions  Precautions Precautions: Back Precaution Booklet Issued: No Precaution Comments: pt acknowledged understanding of back precautions Required Braces or Orthoses: No Restrictions Weight Bearing Restrictions: No Mobility (including Balance) Bed Mobility Rolling Left Details (indicate cue type and reason): pt up out of bed per nursing Transfers Sit to Stand: 4: Min assist Sit to Stand Details (indicate cue type and reason): pushing up from armrests, pt appears to be in pain Stand to Sit: 4: Min assist Ambulation/Gait Ambulation/Gait: Yes Ambulation/Gait Assistance: 3: Mod assist Ambulation/Gait Assistance Details (indicate cue type and reason): patient continues to have difficulty with placemet of left leg in gait.  She has an exaggerated step length, does not consistently get a heel strike and has external rotaton of leg. and tends to place foot near midline. Pt also reports left leg wants to "give out" especially when turning. Ambulation Distance (Feet): 250 Feet (3 rest breaks) Assistive device: Rolling walker Gait Pattern: Step-to pattern;Ataxic Gait velocity: slow, pt slows down as she fatigues Stairs: No Wheelchair Mobility Wheelchair Mobility: No    Exercise  General Exercises - Lower Extremity Hip  ABduction/ADduction: AROM;Left;5 reps;Standing;Strengthening Hip Flexion/Marching: AROM;Strengthening;Standing Toe Raises: AROM;Strengthening;Both;Standing Heel Raises: AROM;Strengthening;Both;Standing Other Exercises Other Exercises: standing isometrics to abds and glutes Other Exercises: stepping and weight shifting with emphasis on postural activation and control End of Session PT - End of Session Equipment Utilized During Treatment: Gait belt (RW) Activity Tolerance: Patient tolerated treatment well Patient left: in chair;with call bell in reach General Behavior During Session: [facility] for tasks performed Cognition: [facility] for tasks performed  Donnetta Hail 11/24/2011, 1:21 PM

## 2011-11-25 ENCOUNTER — Encounter (HOSPITAL_COMMUNITY): Payer: Self-pay | Admitting: Orthopedic Surgery

## 2011-11-25 DIAGNOSIS — IMO0002 Reserved for concepts with insufficient information to code with codable children: Secondary | ICD-10-CM

## 2011-11-25 DIAGNOSIS — M47817 Spondylosis without myelopathy or radiculopathy, lumbosacral region: Secondary | ICD-10-CM

## 2011-11-25 MED ORDER — METHOCARBAMOL 500 MG PO TABS: 500.0000 mg | ORAL_TABLET | Freq: Four times a day (QID) | ORAL | Status: AC

## 2011-11-25 MED ORDER — DSS 100 MG PO CAPS: 100.0000 mg | ORAL_CAPSULE | Freq: Two times a day (BID) | ORAL | Status: AC

## 2011-11-25 MED ORDER — METHOCARBAMOL 500 MG PO TABS: 500.0000 mg | ORAL_TABLET | Freq: Four times a day (QID) | ORAL | Status: AC | PRN

## 2011-11-25 MED ORDER — TRAMADOL HCL 50 MG PO TABS: 50.0000 mg | ORAL_TABLET | Freq: Four times a day (QID) | ORAL | Status: AC | PRN

## 2011-11-25 NOTE — Discharge Summary (Signed)
[name], [name] NO.:  [id number]  MEDICAL RECORD NO.:  [id number]  LOCATION:  [id number]                         FACILITY:  [facility]  PHYSICIAN:  Marlowe Kays, M.D.  DATE OF BIRTH:  [birth year]  DATE OF ADMISSION:  11/20/2011 DATE OF DISCHARGE:                              DISCHARGE SUMMARY   ADMITTING DIAGNOSES: 1. Lumbar stenosis L2-3, L3-4, L4-5, and L5-S1. 2. Hypertension. 3. Hypothyroidism. 4. Gastroesophageal reflux disease.  DISCHARGE DIAGNOSES: 1. Lumbar stenosis L2-3, L3-4, L4-5, and L5-S1. 2. Hypertension. 3. Hypothyroidism. 4. Gastroesophageal reflux disease.  OPERATION:  Decompressive laminectomy L2 through sacrum on 11/20/2011.  SUMMARY:  This lady is admitted today because of progressive back and bilateral leg pain with an MRI demonstrating the admission diagnoses. She has failed to respond to nonsurgical treatment.  Hospital admission workup laboratory-wise was unremarkable.  The surgery proceeded uneventfully.  Postoperatively, at age 78, she was slower than normal in terms of recovery, in terms of ambulation, and ability to care for herself, and consequently, it was felt prudent to send her to a skilled nursing facility at the request of our physical therapist.  [name] a bed for her and she will be admitted there today with physical therapy to consist of progression in ambulation using a walker as necessary and also instruction on activities of daily living.  Her wound is healing nicely and she may shower with dressings as needed. List of medications for transfer are included, including tramadol 50 mg 1 every 6 hours p.r.n. for pain.  The plan is to have her return to my office in roughly 2 weeks from surgery for staple removal.  Please call [phone] for the appointment.          ______________________________ Marlowe Kays, M.D.     JA/MEDQ  D:  11/25/2011  T:  11/25/2011  Job:  [id number]

## 2011-11-25 NOTE — Progress Notes (Signed)
Patient has voided without difficulty 4 times yesterday per patient . Ambulated to the bathroom using 4 wheel walker.

## 2011-11-25 NOTE — Progress Notes (Signed)
Pt to D/C to [facility] for ST rehab today. CSW assisting with D/C to SNF via [facility] transport.

## 2011-11-25 NOTE — Progress Notes (Signed)
Physical Therapy Treatment Patient Details Name: [name] MRN: [id number] DOB: May 02, 1923 Today's Date: 11/25/2011  PT Assessment/Plan  PT - Assessment/Plan Comments on Treatment Session: steady improvement, pt ready to continue PT at ST-SNF PT Plan: Discharge plan needs to be updated PT Frequency: Min 5X/week Follow Up Recommendations: Skilled nursing facility Equipment Recommended: Defer to next venue PT Goals  Acute Rehab PT Goals PT Goal: Sit to Stand - Progress: Partly met PT Goal: Ambulate - Progress: Partly met  PT Treatment Precautions/Restrictions  Precautions Precautions: Back Precaution Booklet Issued: No Precaution Comments: pt acknowledged understanding of back precautions Required Braces or Orthoses: No Restrictions Weight Bearing Restrictions: No Mobility (including Balance) Bed Mobility Bed Mobility: No Rolling Left:  (pt up in bed with nursing) Transfers Transfers: Yes Sit to Stand: 4: Min assist Stand to Sit: 4: Min assist Stand to Sit Details: pt with cues to reach back with arms for safety Ambulation/Gait Ambulation/Gait: Yes Ambulation/Gait Assistance: 4: Min assist Ambulation/Gait Assistance Details (indicate cue type and reason): Pt reports sensation of left knee "giving way" and trembling after she walked about 125' Ambulation Distance (Feet): 150 Feet (rest break after 125 due to left leg weakness) Assistive device: Rolling walker Gait Pattern: Step-through pattern (gait pattern improved at first, ataxia emerged with fatigue) Gait velocity: slow, pt slows down as she fatigues Stairs: No Wheelchair Mobility Wheelchair Mobility: No  Posture/Postural Control Posture/Postural Control: Postural limitations Postural Limitations: pt continues to have slight lumbar flextion due to surgery Exercise  General Exercises - Lower Extremity Hip Flexion/Marching: AROM;Standing (limited by "pulling" on back) Other Exercises Other Exercises: standing  isometrics to abds and glutes End of Session PT - End of Session Equipment Utilized During Treatment: Gait belt Activity Tolerance: Patient tolerated treatment well Patient left: in chair;with call bell in reach Nurse Communication: Mobility status for transfers;Mobility status for ambulation General Behavior During Session: [facility] for tasks performed Cognition: [employer] for tasks performed  Donnetta Hail 11/25/2011, 1:33 PM

## 2011-11-25 NOTE — Progress Notes (Signed)
SNF bed available at [facility] today for pt if stable for D/C. Will assist with D/C planning to SNF.

## 2011-11-25 NOTE — Progress Notes (Signed)
Patient ID: [name], female   DOB: 1923/05/12, 78 y.o.   MRN: [id number] PT has felt she would be better off at a skilled nursing facility and [facility] has a bed.  Priority summary dictated.  Planis to have her return to my office 2 wks post op.

## 2011-11-25 NOTE — Progress Notes (Signed)
Rehab admissions - Evaluated for possible admission.  I talked with Dr. Riley Kill this am.  Patient does not have the medical necessity to justify an inpatient rehab stay.  We recommend SNF level therapies.  I spoke with patient and her first choice is [facility] SNF.  Pager [phone]

## 2012-04-08 ENCOUNTER — Telehealth (HOSPITAL_COMMUNITY): Payer: Self-pay

## 2012-04-08 ENCOUNTER — Other Ambulatory Visit (HOSPITAL_COMMUNITY): Payer: Self-pay | Admitting: Orthopedic Surgery

## 2012-04-08 DIAGNOSIS — IMO0002 Reserved for concepts with insufficient information to code with codable children: Secondary | ICD-10-CM

## 2012-04-08 NOTE — Telephone Encounter (Signed)
SPOKE WITH PT AND SET UP HER CONSULT APPT WITH DR. Corliss Skains FOR 04-13-12

## 2012-04-13 ENCOUNTER — Telehealth (HOSPITAL_COMMUNITY): Payer: Self-pay

## 2012-04-13 ENCOUNTER — Ambulatory Visit (HOSPITAL_COMMUNITY)
Admission: RE | Admit: 2012-04-13 | Discharge: 2012-04-13 | Disposition: A | Discharge status: Home / Self Care | Payer: Medicare Other | Source: Ambulatory Visit | Attending: Orthopedic Surgery | Admitting: Orthopedic Surgery

## 2012-04-13 ENCOUNTER — Other Ambulatory Visit (HOSPITAL_COMMUNITY): Payer: Self-pay | Admitting: Interventional Radiology

## 2012-04-13 DIAGNOSIS — IMO0002 Reserved for concepts with insufficient information to code with codable children: Secondary | ICD-10-CM

## 2012-04-13 NOTE — Telephone Encounter (Signed)
Pt was seen by Dr. Corliss Skains today.  Pt is having severe pain in her back.  Pt has only had a chest xray.  Her pain med is not helping with the pain.  Dr. Corliss Skains will need a MRI to determine possible compression fracture.  Dr. Leslee Home ref pt. I have sent Precert for a MRI Lumbar spine w/o T10 - S-1.

## 2012-04-16 ENCOUNTER — Ambulatory Visit (HOSPITAL_COMMUNITY)
Admission: RE | Admit: 2012-04-16 | Discharge: 2012-04-16 | Disposition: A | Discharge status: Home / Self Care | Payer: Medicare Other | Source: Ambulatory Visit | Attending: Interventional Radiology | Admitting: Interventional Radiology

## 2012-04-16 DIAGNOSIS — M48061 Spinal stenosis, lumbar region without neurogenic claudication: Secondary | ICD-10-CM | POA: Insufficient documentation

## 2012-04-16 DIAGNOSIS — M545 Low back pain, unspecified: Secondary | ICD-10-CM | POA: Insufficient documentation

## 2012-04-16 DIAGNOSIS — M4120 Other idiopathic scoliosis, site unspecified: Secondary | ICD-10-CM | POA: Insufficient documentation

## 2012-04-16 DIAGNOSIS — M5380 Other specified dorsopathies, site unspecified: Secondary | ICD-10-CM | POA: Insufficient documentation

## 2012-04-16 DIAGNOSIS — M79609 Pain in unspecified limb: Secondary | ICD-10-CM | POA: Insufficient documentation

## 2012-04-16 DIAGNOSIS — IMO0002 Reserved for concepts with insufficient information to code with codable children: Secondary | ICD-10-CM

## 2012-04-16 DIAGNOSIS — M5146 Schmorl's nodes, lumbar region: Secondary | ICD-10-CM | POA: Insufficient documentation

## 2012-04-16 DIAGNOSIS — D1809 Hemangioma of other sites: Secondary | ICD-10-CM | POA: Insufficient documentation

## 2012-04-21 ENCOUNTER — Telehealth (HOSPITAL_COMMUNITY): Payer: Self-pay

## 2012-04-21 NOTE — Telephone Encounter (Signed)
[name] has called 2x asking if Dr. Corliss Skains has read her MRI.  I have placed the scan on his desk.  I advised the pt that I would be back in touch with her asa he lets me know.

## 2012-04-22 ENCOUNTER — Telehealth (HOSPITAL_COMMUNITY): Payer: Self-pay

## 2012-04-22 NOTE — Telephone Encounter (Signed)
[name] wants to move forward, she stated that she is unable to walk without pain med.  Her pain level is now at a 10 out of a 10.  She stated that the pain meds are not helping with the pain.  Dr. Corliss Skains stated that he will treat her.

## 2012-04-22 NOTE — Telephone Encounter (Signed)
Dr. Corliss Skains viewed the pts films.  He has stated that per the report there is edema at the level in which the pt has had prior laminectomy s/p 11-20-11.  He has advised that there could be a possible fine compression fracture. But, he also stated that Dr. Leslee Home may want to see the pt to determine if this is from her procedure with him.  Dr. Corliss Skains stated that if the pts pain level is intense he will treat.  I am calling Mrs. Neuner to [employer] her know.  I have spoken with Mrs. Strole.  She wants to move forward with KP/VP

## 2012-04-28 ENCOUNTER — Telehealth (HOSPITAL_COMMUNITY): Payer: Self-pay

## 2012-04-28 NOTE — Telephone Encounter (Signed)
I spoke with [name] 2x yesterday concerning her [employer] ins denial.  She stated that she is in severe pain.  She can barely walk to go to the bathroom.  I advised her that [employer] wants atleast 6 weeks of medical treatment prior to.  She wanted me to ask Dr. Corliss Skains what would he do.  He advised if she was in that much pain it is up to her.  If she Can stand the pain for several more weeks and give the medication a longer chance he will wait to hear from her.  I spoke with her again today.  She stated that the pain is now in her knee area.  She is having tingling going down from her knee.  Her hip is still hurting as well.

## 2012-07-28 ENCOUNTER — Other Ambulatory Visit: Payer: Self-pay | Admitting: Internal Medicine

## 2012-07-28 DIAGNOSIS — Z1231 Encounter for screening mammogram for malignant neoplasm of breast: Secondary | ICD-10-CM

## 2012-08-12 ENCOUNTER — Ambulatory Visit: Payer: Medicare Other

## 2012-08-24 ENCOUNTER — Ambulatory Visit
Admission: RE | Admit: 2012-08-24 | Discharge: 2012-08-24 | Disposition: A | Discharge status: Home / Self Care | Payer: Medicare Other | Source: Ambulatory Visit | Attending: Internal Medicine | Admitting: Internal Medicine

## 2012-08-24 DIAGNOSIS — Z1231 Encounter for screening mammogram for malignant neoplasm of breast: Secondary | ICD-10-CM

## 2012-08-26 ENCOUNTER — Other Ambulatory Visit: Payer: Self-pay | Admitting: Internal Medicine

## 2012-08-26 DIAGNOSIS — R928 Other abnormal and inconclusive findings on diagnostic imaging of breast: Secondary | ICD-10-CM

## 2012-08-28 ENCOUNTER — Ambulatory Visit
Admission: RE | Admit: 2012-08-28 | Discharge: 2012-08-28 | Disposition: A | Discharge status: Home / Self Care | Payer: Medicare Other | Source: Ambulatory Visit | Attending: Internal Medicine | Admitting: Internal Medicine

## 2012-08-28 DIAGNOSIS — R928 Other abnormal and inconclusive findings on diagnostic imaging of breast: Secondary | ICD-10-CM

## 2012-10-05 ENCOUNTER — Encounter: Payer: Self-pay | Admitting: Vascular Surgery

## 2012-10-06 ENCOUNTER — Ambulatory Visit (INDEPENDENT_AMBULATORY_CARE_PROVIDER_SITE_OTHER): Payer: Medicare Other | Admitting: Vascular Surgery

## 2012-10-06 ENCOUNTER — Encounter: Payer: Self-pay | Admitting: Vascular Surgery

## 2012-10-06 VITALS — BP 136/82 | HR 80 | Resp 20 | Ht 67.0 in | Wt 159.0 lb

## 2012-10-06 DIAGNOSIS — R200 Anesthesia of skin: Secondary | ICD-10-CM | POA: Insufficient documentation

## 2012-10-06 DIAGNOSIS — R209 Unspecified disturbances of skin sensation: Secondary | ICD-10-CM

## 2012-10-06 NOTE — Progress Notes (Signed)
Vascular and Vein Specialist of [facility]   Patient name: [name] MRN: [id number] DOB: 11-29-1922 Sex: female   Referred by: [place]  Reason for referral:  Chief Complaint  Patient presents with  . New Evaluation    LEFT LEG NUMBNESS    HISTORY OF PRESENT ILLNESS: Patient is an active 78 year old female who was seen today for evaluation of left leg numbness. She had extensive spine surgery in January of 2013. Following this she noted numbness in her left foot and now reports that this is involving her left knee as well. She also reports occasional shooting pain sensations in her foot. She is quite active and does walk but reports this is somewhat awkward now related to the numbness in her left foot. On further questioning with more extensive walking she does have what sounds like fairly typical mild left calf claudication. She has no history of tissue loss and no history of coronary disease.  Past Medical History  Diagnosis Date  . Hypertension   . Hypothyroidism   . GERD (gastroesophageal reflux disease)     Past Surgical History  Procedure Date  . Tonsillectomy   . Appendectomy   . Abdominal hysterectomy   . Other surgical history     left wrist surgery - has plate in left wrist   . Other surgical history     right knee surgery due to torn cartilage   . Lumbar laminectomy/decompression microdiscectomy 11/20/2011    Procedure: LUMBAR LAMINECTOMY/DECOMPRESSION MICRODISCECTOMY;  Surgeon: Fayrene Fearing P [place];  Location: [facility];  Service: Orthopedics;  Laterality: N/A;  Decompressive Laminectomy L2 to the Sacrum (X-Ray)    History   Social History  . Marital Status: Widowed    Spouse Name: N/A    Number of Children: N/A  . Years of Education: N/A   Occupational History  . Not on file.   Social History Main Topics  . Smoking status: Never Smoker   . Smokeless tobacco: Never Used  . Alcohol Use: No  . Drug Use: No  . Sexually Active: Not on file   Other Topics  Concern  . Not on file   Social History Narrative  . No narrative on file    Family History  Problem Relation Age of Onset  . Cancer Mother     BREAST  . COPD Father     Allergies as of 10/06/2012 - Review Complete 10/06/2012  Allergen Reaction Noted  . Codeine Other (See Comments) 11/01/2011    Current Outpatient Prescriptions on File Prior to Visit  Medication Sig Dispense Refill  . amLODipine (NORVASC) 10 MG tablet Take 10 mg by mouth daily before breakfast.       . levothyroxine (SYNTHROID, LEVOTHROID) 50 MCG tablet Take 50 mcg by mouth daily before breakfast.       . nadolol (CORGARD) 80 MG tablet Take 80 mg by mouth daily before breakfast.       . omeprazole (PRILOSEC) 20 MG capsule Take 20 mg by mouth daily before breakfast.       . Vitamin D, Ergocalciferol, (DRISDOL) 50000 UNITS CAPS Take 50,000 Units by mouth every 7 (seven) days. Mondays.      . traMADol (ULTRAM) 50 MG tablet Take 50 mg by mouth every 6 (six) hours as needed. For pain. Maximum dose= 8 tablets per day         REVIEW OF SYSTEMS:  Positives indicated with an "X"  CARDIOVASCULAR:  [ ]  chest pain   [ ]  chest pressure   [ ]   palpitations   [ ]  orthopnea   [ ]  dyspnea on exertion   [ ]  claudication   [ ]  rest pain   [ ]  DVT   [ ]  phlebitis PULMONARY:   [ ]  productive cough   [ ]  asthma   [ ]  wheezing NEUROLOGIC:   [ ]  weakness  [x ] paresthesias  [ ]  aphasia  [ ]  amaurosis  [ ]  dizziness HEMATOLOGIC:   [ ]  bleeding problems   [ ]  clotting disorders MUSCULOSKELETAL:  [ ]  joint pain   [ ]  joint swelling GASTROINTESTINAL: [ ]   blood in stool  [ ]   hematemesis GENITOURINARY:  [ ]   dysuria  [ ]   hematuria PSYCHIATRIC:  [ ]  history of major depression INTEGUMENTARY:  [ ]  rashes  [ ]  ulcers CONSTITUTIONAL:  [ ]  fever   [ ]  chills  PHYSICAL EXAMINATION:  General: The patient is a well-nourished female, in no acute distress. Vital signs are BP 136/82  Pulse 80  Resp 20  Ht 5\' 7"  (1.702 m)  Wt 159 lb  (72.122 kg)  BMI 24.90 kg/m2 Pulmonary: There is a good air exchange bilaterally without wheezing or rales. Abdomen: Soft and non-tender with normal pitch bowel sounds. Musculoskeletal: There are no major deformities.  There is no significant extremity pain. Neurologic: No focal weakness or paresthesias are detected, Skin: There are no ulcer or rashes noted. Psychiatric: The patient has normal affect. Cardiovascular: There is a regular rate and rhythm without significant murmur appreciated. Pulse status: 2+ popliteal pulses bilaterally. 1+ dorsalis pedis pulses bilaterally  Noninvasive vascular studies at an outlying lab revealed normal resting ankle arm indices. There is evidence of narrowing in the tibial vessels by duplex.  Also have neurologic nerve conduction study review from outlying records which I have reviewed  Impression and Plan:  Mild lower surety arterial insufficiency with normal resting ankle arm indices and palpable pedal pulses bilaterally. At a long discussion with the patient and her daughter present. I do not feel that her numbness is related to arterial insufficiency. She does have what sounds by clinical history mild left calf claudication. I explained there would be no specific treatment for this and that there is no difficulty with walking with some mild calf tiredness. She is not limited by this symptom. The patient is frustrated not knowing the cause of her numbness. She will continue to followup with Dr. Simonne Come.    Onyekachi Gathright [facility] [place] Office: [phone]

## 2012-11-11 LAB — HM DEXA SCAN: HM Dexa Scan: NORMAL

## 2013-01-25 ENCOUNTER — Other Ambulatory Visit: Payer: Self-pay | Admitting: Internal Medicine

## 2013-01-25 DIAGNOSIS — N63 Unspecified lump in unspecified breast: Secondary | ICD-10-CM

## 2013-02-17 ENCOUNTER — Ambulatory Visit
Admission: RE | Admit: 2013-02-17 | Discharge: 2013-02-17 | Disposition: A | Discharge status: Home / Self Care | Payer: Medicare Other | Source: Ambulatory Visit | Attending: Internal Medicine | Admitting: Internal Medicine

## 2013-02-17 DIAGNOSIS — N63 Unspecified lump in unspecified breast: Secondary | ICD-10-CM

## 2013-07-21 ENCOUNTER — Other Ambulatory Visit: Payer: Self-pay | Admitting: Internal Medicine

## 2013-07-21 DIAGNOSIS — N649 Disorder of breast, unspecified: Secondary | ICD-10-CM

## 2013-08-26 ENCOUNTER — Ambulatory Visit
Admission: RE | Admit: 2013-08-26 | Discharge: 2013-08-26 | Disposition: A | Discharge status: Home / Self Care | Payer: Medicare Other | Source: Ambulatory Visit | Attending: Internal Medicine | Admitting: Internal Medicine

## 2013-08-26 ENCOUNTER — Other Ambulatory Visit: Payer: Self-pay | Admitting: Internal Medicine

## 2013-08-26 DIAGNOSIS — N649 Disorder of breast, unspecified: Secondary | ICD-10-CM

## 2014-04-20 ENCOUNTER — Ambulatory Visit (HOSPITAL_COMMUNITY)
Admission: RE | Admit: 2014-04-20 | Discharge: 2014-04-20 | Disposition: A | Discharge status: Home / Self Care | Payer: 59 | Source: Ambulatory Visit | Attending: Vascular Surgery | Admitting: Vascular Surgery

## 2014-04-20 ENCOUNTER — Other Ambulatory Visit (HOSPITAL_COMMUNITY): Payer: Self-pay | Admitting: Internal Medicine

## 2014-04-20 DIAGNOSIS — I739 Peripheral vascular disease, unspecified: Secondary | ICD-10-CM

## 2014-08-02 ENCOUNTER — Other Ambulatory Visit: Payer: Self-pay

## 2014-08-02 DIAGNOSIS — Z1231 Encounter for screening mammogram for malignant neoplasm of breast: Secondary | ICD-10-CM

## 2014-08-29 ENCOUNTER — Ambulatory Visit
Admission: RE | Admit: 2014-08-29 | Discharge: 2014-08-29 | Disposition: A | Discharge status: Home / Self Care | Payer: 59 | Source: Ambulatory Visit

## 2014-08-29 ENCOUNTER — Encounter (INDEPENDENT_AMBULATORY_CARE_PROVIDER_SITE_OTHER): Payer: Self-pay

## 2014-08-29 DIAGNOSIS — Z1231 Encounter for screening mammogram for malignant neoplasm of breast: Secondary | ICD-10-CM

## 2015-03-01 ENCOUNTER — Ambulatory Visit: Payer: Self-pay | Admitting: Surgery

## 2015-03-01 NOTE — H&P (Signed)
History of Present Illness [name]. Leondre Taul MD; 03/01/2015 2:27 PM) Patient words: cyst on arm.  The patient is a 81 year old female who presents with a complaint of Mass. Referred by Dr. Osborne Casco for evaluation of left arm mass This is an 81 year old female in remarkably good health who presents with several months of an enlarging mass in her medial upper arm. This has caused some mild tenderness. She is concerned because it is enlarging noticeably. She has never had any similar masses. There does not seem to be any problems with her arm function although she does have some chronic shoulder issues. She would like to discuss excision. Other Problems Elbert Ewings, CMA; 03/01/2015 1:34 PM) Arthritis Back Pain Gastroesophageal Reflux Disease High blood pressure  Past Surgical History Elbert Ewings, CMA; 03/01/2015 1:34 PM) Appendectomy Breast Biopsy Left. Cataract Surgery Bilateral. Colon Polyp Removal - Colonoscopy Colon Polyp Removal - Open Hysterectomy (not due to cancer) - Partial Knee Surgery Right. Spinal Surgery - Lower Back Spinal Surgery Midback Tonsillectomy  Diagnostic Studies History Elbert Ewings, CMA; 03/01/2015 1:34 PM) Colonoscopy 5-10 years ago Mammogram within last year Pap Smear >5 years ago  Allergies Elbert Ewings, CMA; 03/01/2015 1:34 PM) Codeine Phosphate *ANALGESICS - OPIOID*  Medication History Elbert Ewings, CMA; 03/01/2015 1:35 PM) TraMADol HCl (50MG  Tablet, Oral) Active. AmLODIPine Besylate (10MG  Tablet, Oral) Active. Benzonatate (200MG  Capsule, Oral) Active. Cefdinir (300MG  Capsule, Oral) Active. Gabapentin (300MG  Capsule, Oral) Active. Levothyroxine Sodium (50MCG Tablet, Oral) Active. Nadolol (80MG  Tablet, Oral) Active. Omeprazole (20MG  Capsule DR, Oral) Active. Ondansetron HCl (4MG  Tablet, Oral) Active. Vitamin D (Ergocalciferol) (50000UNIT Capsule, Oral) Active. Medications Reconciled  Social History Elbert Ewings, [place];  03/01/2015 1:34 PM) Caffeine use Coffee. No alcohol use No drug use Tobacco use Never smoker.  Family History Elbert Ewings, [place]; 03/01/2015 1:34 PM) Breast Cancer Daughter, Mother. Colon Cancer Father. Hypertension Family Members In General, Mother. Migraine Headache Daughter. Respiratory Condition Father.  Pregnancy / Birth History Elbert Ewings, CMA; 03/01/2015 1:34 PM) Age at menarche 5 years. Age of menopause 56-60 Gravida 89 Maternal age 4-25 Para 3     Review of Systems Elbert Ewings CMA; 03/01/2015 1:34 PM) General Not Present- Appetite Loss, Chills, Fatigue, Fever, Night Sweats, Weight Gain and Weight Loss. Skin Not Present- Change in Wart/Mole, Dryness, Hives, Jaundice, New Lesions, Non-Healing Wounds, Rash and Ulcer. HEENT Present- Seasonal Allergies. Not Present- Earache, Hearing Loss, Hoarseness, Nose Bleed, Oral Ulcers, Ringing in the Ears, Sinus Pain, Sore Throat, Visual Disturbances, Wears glasses/contact lenses and Yellow Eyes. Respiratory Not Present- Bloody sputum, Chronic Cough, Difficulty Breathing, Snoring and Wheezing. Breast Not Present- Breast Mass, Breast Pain, Nipple Discharge and Skin Changes. Cardiovascular Not Present- Chest Pain, Difficulty Breathing Lying Down, Leg Cramps, Palpitations, Rapid Heart Rate, Shortness of Breath and Swelling of Extremities. Gastrointestinal Present- Excessive gas. Not Present- Abdominal Pain, Bloating, Bloody Stool, Change in Bowel Habits, Chronic diarrhea, Constipation, Difficulty Swallowing, Gets full quickly at meals, Hemorrhoids, Indigestion, Nausea, Rectal Pain and Vomiting. Female Genitourinary Present- Frequency and Nocturia. Not Present- Painful Urination, Pelvic Pain and Urgency. Musculoskeletal Present- Back Pain. Not Present- Joint Pain, Joint Stiffness, Muscle Pain, Muscle Weakness and Swelling of Extremities. Neurological Present- Numbness, Tingling and Trouble walking. Not Present- Decreased Memory,  Fainting, Headaches, Seizures, Tremor and Weakness. Psychiatric Not Present- Anxiety, Bipolar, Change in Sleep Pattern, Depression, Fearful and Frequent crying. Endocrine Not Present- Cold Intolerance, Excessive Hunger, Hair Changes, Heat Intolerance, Hot flashes and New Diabetes. Hematology Present- Easy Bruising. Not Present- Excessive bleeding, Gland problems, HIV  and Persistent Infections.  Vitals Elbert Ewings CMA; 03/01/2015 1:35 PM) 03/01/2015 1:35 PM Weight: 163 lb Height: 67in Body Surface Area: 1.87 m Body Mass Index: 25.53 kg/m Temp.: 98.79F(Oral)  Pulse: 71 (Regular)  Resp.: 17 (Unlabored)  BP: 122/80 (Sitting, Left Arm, Standard)     Physical Exam Rodman Key K. Yvonda Fouty MD; 03/01/2015 2:27 PM)  The physical exam findings are as follows: Note:WDWN in NAD HEENT: EOMI, sclera anicteric Neck: No masses, no thyromegaly Lungs: CTA bilaterally; normal respiratory effort CV: Regular rate and rhythm; no murmurs Abd: +bowel sounds, soft, non-tender, no masses Left upper medial arm - 2 cm subcutaneous mass; mobile, smooth; small overlying vein Ext: Well-perfused; no edema Skin: Warm, dry; no sign of jaundice    Assessment & Plan Rodman Key K. Zinia Innocent MD; 03/01/2015 2:06 PM)  LIPOMA OF LEFT UPPER EXTREMITY (214.8  D17.22)  Current Plans Schedule for Surgery - Excision of subcutaneous lipoma - left arm (2 cm). The surgical procedure has been discussed with the patient. Potential risks, benefits, alternative treatments, and expected outcomes have been explained. All of the patient's questions at this time have been answered. The likelihood of reaching the patient's treatment goal is good. The patient understand the proposed surgical procedure and wishes to proceed.  [name]. Georgette Dover, MD, [facility] Surgery  General/ Trauma Surgery  03/01/2015 2:30 PM

## 2015-04-24 ENCOUNTER — Encounter (HOSPITAL_BASED_OUTPATIENT_CLINIC_OR_DEPARTMENT_OTHER): Payer: Self-pay | Admitting: *Deleted

## 2015-04-25 ENCOUNTER — Ambulatory Visit (HOSPITAL_BASED_OUTPATIENT_CLINIC_OR_DEPARTMENT_OTHER): Payer: Medicare Other | Admitting: Certified Registered"

## 2015-04-25 ENCOUNTER — Encounter (HOSPITAL_BASED_OUTPATIENT_CLINIC_OR_DEPARTMENT_OTHER): Payer: Self-pay

## 2015-04-25 ENCOUNTER — Ambulatory Visit (HOSPITAL_BASED_OUTPATIENT_CLINIC_OR_DEPARTMENT_OTHER)
Admission: RE | Admit: 2015-04-25 | Discharge: 2015-04-25 | Disposition: A | Discharge status: Home / Self Care | Payer: Medicare Other | Source: Ambulatory Visit | Attending: Surgery | Admitting: Surgery

## 2015-04-25 ENCOUNTER — Encounter (HOSPITAL_BASED_OUTPATIENT_CLINIC_OR_DEPARTMENT_OTHER)
Admission: RE | Disposition: A | Discharge status: Home / Self Care | Payer: Self-pay | Source: Ambulatory Visit | Attending: Surgery

## 2015-04-25 DIAGNOSIS — I440 Atrioventricular block, first degree: Secondary | ICD-10-CM | POA: Diagnosis not present

## 2015-04-25 DIAGNOSIS — Z79899 Other long term (current) drug therapy: Secondary | ICD-10-CM | POA: Insufficient documentation

## 2015-04-25 DIAGNOSIS — Z79891 Long term (current) use of opiate analgesic: Secondary | ICD-10-CM | POA: Insufficient documentation

## 2015-04-25 DIAGNOSIS — R001 Bradycardia, unspecified: Secondary | ICD-10-CM | POA: Insufficient documentation

## 2015-04-25 DIAGNOSIS — I739 Peripheral vascular disease, unspecified: Secondary | ICD-10-CM | POA: Diagnosis not present

## 2015-04-25 DIAGNOSIS — I10 Essential (primary) hypertension: Secondary | ICD-10-CM | POA: Insufficient documentation

## 2015-04-25 DIAGNOSIS — E039 Hypothyroidism, unspecified: Secondary | ICD-10-CM | POA: Insufficient documentation

## 2015-04-25 DIAGNOSIS — M1990 Unspecified osteoarthritis, unspecified site: Secondary | ICD-10-CM | POA: Diagnosis not present

## 2015-04-25 DIAGNOSIS — Z792 Long term (current) use of antibiotics: Secondary | ICD-10-CM | POA: Insufficient documentation

## 2015-04-25 DIAGNOSIS — D1722 Benign lipomatous neoplasm of skin and subcutaneous tissue of left arm: Secondary | ICD-10-CM | POA: Diagnosis not present

## 2015-04-25 DIAGNOSIS — K219 Gastro-esophageal reflux disease without esophagitis: Secondary | ICD-10-CM | POA: Diagnosis not present

## 2015-04-25 HISTORY — DX: Peripheral vascular disease, unspecified: I73.9

## 2015-04-25 HISTORY — PX: LIPOMA EXCISION: SHX5283

## 2015-04-25 LAB — POCT I-STAT, CHEM 8
BUN: 21 mg/dL — ABNORMAL HIGH (ref 6–20)
Calcium, Ion: 1.3 mmol/L (ref 1.13–1.30)
Chloride: 101 mmol/L (ref 101–111)
Creatinine, Ser: 0.9 mg/dL (ref 0.44–1.00)
Glucose, Bld: 114 mg/dL — ABNORMAL HIGH (ref 65–99)
HCT: 44 % (ref 36.0–46.0)
Hemoglobin: 15 g/dL (ref 12.0–15.0)
Potassium: 4.5 mmol/L (ref 3.5–5.1)
Sodium: 145 mmol/L (ref 135–145)
TCO2: 22 mmol/L (ref 0–100)

## 2015-04-25 SURGERY — EXCISION LIPOMA
Anesthesia: Monitor Anesthesia Care | Site: Arm Upper | Laterality: Left

## 2015-04-25 MED ORDER — SUCCINYLCHOLINE CHLORIDE 20 MG/ML IJ SOLN
INTRAMUSCULAR | Status: AC
  Filled 2015-04-25: qty 1

## 2015-04-25 MED ORDER — SCOPOLAMINE 1 MG/3DAYS TD PT72: 1.0000 | MEDICATED_PATCH | Freq: Once | TRANSDERMAL | Status: DC | PRN

## 2015-04-25 MED ORDER — ACETAMINOPHEN 160 MG/5ML PO SOLN: 960.0000 mg | Freq: Once | ORAL | Status: DC

## 2015-04-25 MED ORDER — CHLORHEXIDINE GLUCONATE 4 % EX LIQD: 1.0000 "application " | Freq: Once | CUTANEOUS | Status: DC

## 2015-04-25 MED ORDER — MIDAZOLAM HCL 2 MG/2ML IJ SOLN: 1.0000 mg | INTRAMUSCULAR | Status: DC | PRN

## 2015-04-25 MED ORDER — MORPHINE SULFATE 2 MG/ML IJ SOLN: 2.0000 mg | INTRAMUSCULAR | Status: DC | PRN

## 2015-04-25 MED ORDER — FENTANYL CITRATE (PF) 100 MCG/2ML IJ SOLN
INTRAMUSCULAR | Status: DC | PRN
  Administered 2015-04-25 (×3): 25 ug via INTRAVENOUS

## 2015-04-25 MED ORDER — BUPIVACAINE-EPINEPHRINE 0.25% -1:200000 IJ SOLN
INTRAMUSCULAR | Status: DC | PRN
  Administered 2015-04-25: 11 mL

## 2015-04-25 MED ORDER — PROPOFOL INFUSION 10 MG/ML OPTIME
INTRAVENOUS | Status: DC | PRN
  Administered 2015-04-25: 100 ug/kg/min via INTRAVENOUS

## 2015-04-25 MED ORDER — ACETAMINOPHEN 500 MG PO TABS: 1000.0000 mg | ORAL_TABLET | Freq: Once | ORAL | Status: DC

## 2015-04-25 MED ORDER — LIDOCAINE HCL (CARDIAC) 20 MG/ML IV SOLN
INTRAVENOUS | Status: DC | PRN
Start: 2015-04-25 — End: 2015-04-25
  Administered 2015-04-25: 25 mg via INTRAVENOUS

## 2015-04-25 MED ORDER — ONDANSETRON HCL 4 MG/2ML IJ SOLN: 4.0000 mg | INTRAMUSCULAR | Status: DC | PRN

## 2015-04-25 MED ORDER — PROPOFOL 500 MG/50ML IV EMUL
INTRAVENOUS | Status: AC
  Filled 2015-04-25: qty 50

## 2015-04-25 MED ORDER — BUPIVACAINE-EPINEPHRINE (PF) 0.25% -1:200000 IJ SOLN
INTRAMUSCULAR | Status: AC
  Filled 2015-04-25: qty 30

## 2015-04-25 MED ORDER — LIDOCAINE-EPINEPHRINE (PF) 1 %-1:200000 IJ SOLN
INTRAMUSCULAR | Status: AC
  Filled 2015-04-25: qty 10

## 2015-04-25 MED ORDER — HYDROCODONE-ACETAMINOPHEN 5-325 MG PO TABS: 1.0000 | ORAL_TABLET | ORAL | Status: DC | PRN

## 2015-04-25 MED ORDER — BUPIVACAINE-EPINEPHRINE (PF) 0.5% -1:200000 IJ SOLN
INTRAMUSCULAR | Status: AC
  Filled 2015-04-25: qty 30

## 2015-04-25 MED ORDER — HYDROCODONE-ACETAMINOPHEN 7.5-325 MG PO TABS: 1.0000 | ORAL_TABLET | Freq: Once | ORAL | Status: DC | PRN

## 2015-04-25 MED ORDER — FENTANYL CITRATE (PF) 100 MCG/2ML IJ SOLN
INTRAMUSCULAR | Status: AC
  Filled 2015-04-25: qty 4

## 2015-04-25 MED ORDER — FENTANYL CITRATE (PF) 100 MCG/2ML IJ SOLN: 25.0000 ug | INTRAMUSCULAR | Status: DC | PRN

## 2015-04-25 MED ORDER — LACTATED RINGERS IV SOLN
INTRAVENOUS | Status: DC
  Administered 2015-04-25: 07:00:00 via INTRAVENOUS

## 2015-04-25 MED ORDER — PROMETHAZINE HCL 25 MG/ML IJ SOLN: 6.2500 mg | INTRAMUSCULAR | Status: DC | PRN

## 2015-04-25 MED ORDER — HYDROCODONE-ACETAMINOPHEN 5-325 MG PO TABS
1.0000 | ORAL_TABLET | ORAL | Status: DC | PRN
Start: 2015-04-25 — End: 2016-01-25

## 2015-04-25 SURGICAL SUPPLY — 45 items
APL SKNCLS STERI-STRIP NONHPOA (GAUZE/BANDAGES/DRESSINGS) ×1
BENZOIN TINCTURE PRP APPL 2/3 (GAUZE/BANDAGES/DRESSINGS) ×2 IMPLANT
BLADE CLIPPER SURG (BLADE) IMPLANT
BLADE SURG 15 STRL LF DISP TIS (BLADE) ×1 IMPLANT
BLADE SURG 15 STRL SS (BLADE) ×2
CANISTER SUCT 1200ML W/VALVE (MISCELLANEOUS) IMPLANT
CHLORAPREP W/TINT 26ML (MISCELLANEOUS) ×2 IMPLANT
CLSR STERI-STRIP ANTIMIC 1/2X4 (GAUZE/BANDAGES/DRESSINGS) ×1 IMPLANT
COVER BACK TABLE 60X90IN (DRAPES) ×2 IMPLANT
COVER MAYO STAND STRL (DRAPES) ×2 IMPLANT
DECANTER SPIKE VIAL GLASS SM (MISCELLANEOUS) IMPLANT
DRAPE LAPAROTOMY 100X72 PEDS (DRAPES) ×2 IMPLANT
DRAPE UTILITY XL STRL (DRAPES) ×2 IMPLANT
DRSG TEGADERM 2-3/8X2-3/4 SM (GAUZE/BANDAGES/DRESSINGS) ×2 IMPLANT
ELECT COATED BLADE 2.86 ST (ELECTRODE) ×2 IMPLANT
ELECT REM PT RETURN 9FT ADLT (ELECTROSURGICAL) ×2
ELECTRODE REM PT RTRN 9FT ADLT (ELECTROSURGICAL) ×1 IMPLANT
GLOVE BIO SURGEON STRL SZ 6.5 (GLOVE) ×1 IMPLANT
GLOVE BIO SURGEON STRL SZ7 (GLOVE) ×2 IMPLANT
GLOVE BIOGEL PI IND STRL 6.5 (GLOVE) IMPLANT
GLOVE BIOGEL PI IND STRL 7.5 (GLOVE) ×1 IMPLANT
GLOVE BIOGEL PI INDICATOR 6.5 (GLOVE) ×1
GLOVE BIOGEL PI INDICATOR 7.5 (GLOVE) ×1
GOWN STRL REUS W/ TWL LRG LVL3 (GOWN DISPOSABLE) ×1 IMPLANT
GOWN STRL REUS W/TWL LRG LVL3 (GOWN DISPOSABLE) ×2
NDL HYPO 25X1 1.5 SAFETY (NEEDLE) ×1 IMPLANT
NEEDLE HYPO 25X1 1.5 SAFETY (NEEDLE) ×2 IMPLANT
NS IRRIG 1000ML POUR BTL (IV SOLUTION) ×1 IMPLANT
PACK BASIN DAY SURGERY FS (CUSTOM PROCEDURE TRAY) ×2 IMPLANT
PENCIL BUTTON HOLSTER BLD 10FT (ELECTRODE) ×2 IMPLANT
SHEET MEDIUM DRAPE 40X70 STRL (DRAPES) ×1 IMPLANT
SPONGE GAUZE 2X2 8PLY STRL LF (GAUZE/BANDAGES/DRESSINGS) ×1 IMPLANT
SPONGE GAUZE 4X4 12PLY STER LF (GAUZE/BANDAGES/DRESSINGS) IMPLANT
STRIP CLOSURE SKIN 1/2X4 (GAUZE/BANDAGES/DRESSINGS) ×2 IMPLANT
SUT MON AB 4-0 PC3 18 (SUTURE) ×1 IMPLANT
SUT PROLENE 6 0 P 1 18 (SUTURE) IMPLANT
SUT SILK 2 0 FS (SUTURE) IMPLANT
SUT VIC AB 3-0 SH 27 (SUTURE) ×2
SUT VIC AB 3-0 SH 27X BRD (SUTURE) IMPLANT
SUT VICRYL 3-0 CR8 SH (SUTURE) IMPLANT
SYR CONTROL 10ML LL (SYRINGE) ×2 IMPLANT
TOWEL OR 17X24 6PK STRL BLUE (TOWEL DISPOSABLE) ×2 IMPLANT
TOWEL OR NON WOVEN STRL DISP B (DISPOSABLE) ×2 IMPLANT
TUBE CONNECTING 20X1/4 (TUBING) IMPLANT
YANKAUER SUCT BULB TIP NO VENT (SUCTIONS) IMPLANT

## 2015-04-25 NOTE — Anesthesia Preprocedure Evaluation (Addendum)
Anesthesia Evaluation  Patient identified by MRN, date of birth, ID band Patient awake    Reviewed: Allergy & Precautions, NPO status , Patient's Chart, lab work & pertinent test results, reviewed documented beta blocker date and time   Airway Mallampati: II  TM Distance: >3 FB Neck ROM: Full    Dental   Pulmonary neg pulmonary ROS,  breath sounds clear to auscultation        Cardiovascular hypertension, Pt. on home beta blockers and Pt. on medications + Peripheral Vascular Disease Rhythm:Regular Rate:Normal     Neuro/Psych negative neurological ROS     GI/Hepatic Neg liver ROS, GERD-  Medicated,  Endo/Other  Hypothyroidism   Renal/GU negative Renal ROS     Musculoskeletal   Abdominal   Peds  Hematology negative hematology ROS (+)   Anesthesia Other Findings   Reproductive/Obstetrics                            Anesthesia Physical Anesthesia Plan  ASA: II  Anesthesia Plan: MAC   Post-op Pain Management:    Induction: Intravenous  Airway Management Planned: Natural Airway and Simple Face Mask  Additional Equipment:   Intra-op Plan:   Post-operative Plan:   Informed Consent: I have reviewed the patients History and Physical, chart, labs and discussed the procedure including the risks, benefits and alternatives for the proposed anesthesia with the patient or authorized representative who has indicated his/her understanding and acceptance.   Dental advisory given  Plan Discussed with: CRNA  Anesthesia Plan Comments:        Anesthesia Quick Evaluation

## 2015-04-25 NOTE — Anesthesia Postprocedure Evaluation (Signed)
  Anesthesia Post-op Note  Patient: [name]  Procedure(s) Performed: Procedure(s): EXCISION OF LIPOMA LEFT ARM (Left)  Patient Location: PACU  Anesthesia Type:MAC  Level of Consciousness: awake and alert   Airway and Oxygen Therapy: Patient Spontanous Breathing  Post-op Pain: none  Post-op Assessment: Post-op Vital signs reviewed              Post-op Vital Signs: Reviewed  Last Vitals:  Filed Vitals:   04/25/15 0842  BP:   Pulse: 60  Temp: 36.5 C  Resp: 18    Complications: No apparent anesthesia complications

## 2015-04-25 NOTE — Discharge Instructions (Signed)
[place] Office Phone Number [phone]  POST OP INSTRUCTIONS  Always review your discharge instruction sheet given to you by the facility where your surgery was performed.  IF YOU HAVE DISABILITY OR FAMILY LEAVE FORMS, YOU MUST BRING THEM TO THE OFFICE FOR PROCESSING.  DO NOT GIVE THEM TO YOUR DOCTOR.  1. A prescription for pain medication may be given to you upon discharge.  Take your pain medication as prescribed, if needed.  If narcotic pain medicine is not needed, then you may take acetaminophen (Tylenol) or ibuprofen (Advil) as needed. 2. Take your usually prescribed medications unless otherwise directed 3. If you need a refill on your pain medication, please contact your pharmacy.  They will contact our office to request authorization.  Prescriptions will not be filled after 5pm or on week-ends. 4. You should eat very light the first 24 hours after surgery, such as soup, crackers, pudding, etc.  Resume your normal diet the day after surgery. 5. Most patients will experience some swelling and bruising in the area of the incision.  Ice packs will help.  Swelling and bruising can take several days to resolve.  6. It is common to experience some constipation if taking pain medication after surgery.  Increasing fluid intake and taking a stool softener will usually help or prevent this problem from occurring.  A mild laxative (Milk of Magnesia or Miralax) should be taken according to package directions if there are no bowel movements after 48 hours. 7. You may remove your bandages 48 hours after surgery, and you may shower at that time.  You may have steri-strips (small skin tapes) in place directly over the incision.  These strips should be left on the skin for 7-10 days.  8. ACTIVITIES:  You may resume regular daily activities (gradually increasing) beginning the next day.   a. You may drive when you no longer are taking prescription pain medication, you can comfortably wear a  seatbelt, and you can safely maneuver your car and apply brakes. b. RETURN TO WORK:  ______________________________________________________________________________________ 9. You should see your doctor in the office for a follow-up appointment approximately two weeks after your surgery.   10. OTHER INSTRUCTIONS: _______________________________________________________________________________________________ _____________________________________________________________________________________________________________________________________ _____________________________________________________________________________________________________________________________________ _____________________________________________________________________________________________________________________________________  WHEN TO CALL YOUR DOCTOR: 1. Fever over 101.0 2. Nausea and/or vomiting. 3. Extreme swelling or bruising. 4. Continued bleeding from incision. 5. Increased pain, redness, or drainage from the incision.  The clinic staff is available to answer your questions during regular business hours.  Please dont hesitate to call and ask to speak to one of the nurses for clinical concerns.  If you have a medical emergency, go to the nearest emergency room or call 911.  A surgeon from [facility] is always on call at the hospital.  For further questions, please visit [email or web address]     Post Anesthesia Home Care Instructions  Activity: Get plenty of rest for the remainder of the day. A responsible adult should stay with you for 24 hours following the procedure.  For the next 24 hours, DO NOT: -Drive a car -Paediatric nurse -Drink alcoholic beverages -Take any medication unless instructed by your physician -Make any legal decisions or sign important papers.  Meals: Start with liquid foods such as gelatin or soup. Progress to regular foods as tolerated. Avoid greasy, spicy,  heavy foods. If nausea and/or vomiting occur, drink only clear liquids until the nausea and/or vomiting subsides. Call your physician if vomiting continues.  Special Instructions/Symptoms: Your throat may feel dry  or sore from the anesthesia or the breathing tube placed in your throat during surgery. If this causes discomfort, gargle with warm salt water. The discomfort should disappear within 24 hours.  If you had a scopolamine patch placed behind your ear for the management of post- operative nausea and/or vomiting:  1. The medication in the patch is effective for 72 hours, after which it should be removed.  Wrap patch in a tissue and discard in the trash. Wash hands thoroughly with soap and water. 2. You may remove the patch earlier than 72 hours if you experience unpleasant side effects which may include dry mouth, dizziness or visual disturbances. 3. Avoid touching the patch. Wash your hands with soap and water after contact with the patch.

## 2015-04-25 NOTE — Anesthesia Procedure Notes (Signed)
Procedure Name: MAC Date/Time: 04/25/2015 7:32 AM Performed by: Baxter Flattery Pre-anesthesia Checklist: Patient identified, Emergency Drugs available, Suction available, Patient being monitored and Timeout performed Patient Re-evaluated:Patient Re-evaluated prior to inductionOxygen Delivery Method: Simple face mask Preoxygenation: Pre-oxygenation with 100% oxygen Intubation Type: IV induction Ventilation: Mask ventilation without difficulty Dental Injury: Teeth and Oropharynx as per pre-operative assessment

## 2015-04-25 NOTE — Op Note (Signed)
Pre-op Diagnosis:  Subcutaneous lipoma 3 cm left upper arm Post-op Diagnosis:  Same Procedure:  Excision of subcutaneous lipoma left upper arm Surgeon:  Marquavius Scaife K. Anesthesia:  Local - MAC Indications:  81 yo female with enlarging mass in left upper arm presents for excision.  This mass causes some discomfort.  Description of procedure:  The patient was brought to the operating room and placed in a supine position on the operating room table.  She was given some intravenous sedation.  Her left upper arm was prepped with Chloraprep and draped in sterile fashion.  A time out was taken.  The area over the mass was infiltrated with 0.25% Marcaine with epi.  A 2 cm incision was made over the mass.  I dissected down to the mass bluntly.  The mass was removed intact and sent for pathologic examination.  Hemostasis was obtained with cautery.  The wound was closed with 3-0 Vicryl and 4-0 Monocryl.  Steri-strips and a clean dressing were applied.  Counts were correct.  Imogene Burn. Georgette Dover, MD, [facility]  General/ Trauma Surgery  04/25/2015 8:07 AM

## 2015-04-25 NOTE — H&P (Signed)
History of Present Illness  Patient words: cyst on arm.  The patient is a 81 year old female who presents with a complaint of Mass. Referred by Dr. Osborne Casco for evaluation of left arm mass This is an 81 year old female in remarkably good health who presents with several months of an enlarging mass in her medial upper arm. This has caused some mild tenderness. She is concerned because it is enlarging noticeably. She has never had any similar masses. There does not seem to be any problems with her arm function although she does have some chronic shoulder issues. She would like to discuss excision. Other Problems Arthritis Back Pain Gastroesophageal Reflux Disease High blood pressure  Past Surgical History  Appendectomy Breast Biopsy Left. Cataract Surgery Bilateral. Colon Polyp Removal - Colonoscopy Colon Polyp Removal - Open Hysterectomy (not due to cancer) - Partial Knee Surgery Right. Spinal Surgery - Lower Back Spinal Surgery Midback Tonsillectomy  Diagnostic Studies History  Colonoscopy 5-10 years ago Mammogram within last year Pap Smear >5 years ago  Allergies  Codeine Phosphate *ANALGESICS - OPIOID*  Medication History  TraMADol HCl (50MG  Tablet, Oral) Active. AmLODIPine Besylate (10MG  Tablet, Oral) Active. Benzonatate (200MG  Capsule, Oral) Active. Cefdinir (300MG  Capsule, Oral) Active. Gabapentin (300MG  Capsule, Oral) Active. Levothyroxine Sodium (50MCG Tablet, Oral) Active. Nadolol (80MG  Tablet, Oral) Active. Omeprazole (20MG  Capsule DR, Oral) Active. Ondansetron HCl (4MG  Tablet, Oral) Active. Vitamin D (Ergocalciferol) (50000UNIT Capsule, Oral) Active. Medications Reconciled  Social History  Caffeine use Coffee. No alcohol use No drug use Tobacco use Never smoker.  Family History  Breast Cancer Daughter, Mother. Colon Cancer Father. Hypertension Family Members In General, Mother. Migraine Headache  Daughter. Respiratory Condition Father.  Pregnancy / Birth History Age at menarche 56 years. Age of menopause [age] Gravida 73 Maternal age 1-25 Para 3     Review of Systems  General Not Present- Appetite Loss, Chills, Fatigue, Fever, Night Sweats, Weight Gain and Weight Loss. Skin Not Present- Change in Wart/Mole, Dryness, Hives, Jaundice, New Lesions, Non-Healing Wounds, Rash and Ulcer. HEENT Present- Seasonal Allergies. Not Present- Earache, Hearing Loss, Hoarseness, Nose Bleed, Oral Ulcers, Ringing in the Ears, Sinus Pain, Sore Throat, Visual Disturbances, Wears glasses/contact lenses and Yellow Eyes. Respiratory Not Present- Bloody sputum, Chronic Cough, Difficulty Breathing, Snoring and Wheezing. Breast Not Present- Breast Mass, Breast Pain, Nipple Discharge and Skin Changes. Cardiovascular Not Present- Chest Pain, Difficulty Breathing Lying Down, Leg Cramps, Palpitations, Rapid Heart Rate, Shortness of Breath and Swelling of Extremities. Gastrointestinal Present- Excessive gas. Not Present- Abdominal Pain, Bloating, Bloody Stool, Change in Bowel Habits, Chronic diarrhea, Constipation, Difficulty Swallowing, Gets full quickly at meals, Hemorrhoids, Indigestion, Nausea, Rectal Pain and Vomiting. Female Genitourinary Present- Frequency and Nocturia. Not Present- Painful Urination, Pelvic Pain and Urgency. Musculoskeletal Present- Back Pain. Not Present- Joint Pain, Joint Stiffness, Muscle Pain, Muscle Weakness and Swelling of Extremities. Neurological Present- Numbness, Tingling and Trouble walking. Not Present- Decreased Memory, Fainting, Headaches, Seizures, Tremor and Weakness. Psychiatric Not Present- Anxiety, Bipolar, Change in Sleep Pattern, Depression, Fearful and Frequent crying. Endocrine Not Present- Cold Intolerance, Excessive Hunger, Hair Changes, Heat Intolerance, Hot flashes and New Diabetes. Hematology Present- Easy Bruising. Not Present- Excessive bleeding, Gland  problems, HIV and Persistent Infections.  Vitals  Weight: 163 lb Height: 67in Body Surface Area: 1.87 m Body Mass Index: 25.53 kg/m Temp.: 98.55F(Oral)  Pulse: 71 (Regular)  Resp.: 17 (Unlabored)  BP: 122/80 (Sitting, Left Arm, Standard)     Physical Exam   The physical exam findings are as follows:  Note:WDWN in NAD HEENT: EOMI, sclera anicteric Neck: No masses, no thyromegaly Lungs: CTA bilaterally; normal respiratory effort CV: Regular rate and rhythm; no murmurs Abd: +bowel sounds, soft, non-tender, no masses Left upper medial arm - 2 cm subcutaneous mass; mobile, smooth; small overlying vein Ext: Well-perfused; no edema Skin: Warm, dry; no sign of jaundice    Assessment & Plan  LIPOMA OF LEFT UPPER EXTREMITY (214.8  D17.22)  Current Plans Schedule for Surgery - Excision of subcutaneous lipoma - left arm (2 cm). The surgical procedure has been discussed with the patient. Potential risks, benefits, alternative treatments, and expected outcomes have been explained. All of the patient's questions at this time have been answered. The likelihood of reaching the patient's treatment goal is good. The patient understand the proposed surgical procedure and wishes to proceed.

## 2015-04-25 NOTE — Transfer of Care (Signed)
Immediate Anesthesia Transfer of Care Note  Patient: [name]  Procedure(s) Performed: Procedure(s): EXCISION OF LIPOMA LEFT ARM (Left)  Patient Location: PACU  Anesthesia Type:MAC  Level of Consciousness: awake, alert  and oriented  Airway & Oxygen Therapy: Patient Spontanous Breathing and Patient connected to face mask oxygen  Post-op Assessment: Report given to RN, Post -op Vital signs reviewed and stable and Patient moving all extremities  Post vital signs: Reviewed and stable  Last Vitals:  Filed Vitals:   04/25/15 0801  BP:   Pulse: 56  Temp:   Resp: 14    Complications: No apparent anesthesia complications

## 2015-04-26 ENCOUNTER — Encounter (HOSPITAL_BASED_OUTPATIENT_CLINIC_OR_DEPARTMENT_OTHER): Payer: Self-pay | Admitting: Surgery

## 2015-08-01 ENCOUNTER — Other Ambulatory Visit: Payer: Self-pay

## 2015-08-01 DIAGNOSIS — Z1231 Encounter for screening mammogram for malignant neoplasm of breast: Secondary | ICD-10-CM

## 2015-09-01 ENCOUNTER — Ambulatory Visit
Admission: RE | Admit: 2015-09-01 | Discharge: 2015-09-01 | Disposition: A | Discharge status: Home / Self Care | Payer: Medicare Other | Source: Ambulatory Visit

## 2015-09-01 DIAGNOSIS — Z1231 Encounter for screening mammogram for malignant neoplasm of breast: Secondary | ICD-10-CM

## 2015-09-05 ENCOUNTER — Other Ambulatory Visit: Payer: Self-pay | Admitting: Internal Medicine

## 2015-09-05 DIAGNOSIS — R928 Other abnormal and inconclusive findings on diagnostic imaging of breast: Secondary | ICD-10-CM

## 2015-09-12 DIAGNOSIS — I82409 Acute embolism and thrombosis of unspecified deep veins of unspecified lower extremity: Secondary | ICD-10-CM

## 2015-09-12 HISTORY — DX: Acute embolism and thrombosis of unspecified deep veins of unspecified lower extremity: I82.409

## 2015-09-14 ENCOUNTER — Ambulatory Visit
Admission: RE | Admit: 2015-09-14 | Discharge: 2015-09-14 | Disposition: A | Discharge status: Home / Self Care | Payer: Medicare Other | Source: Ambulatory Visit | Attending: Internal Medicine | Admitting: Internal Medicine

## 2015-09-14 DIAGNOSIS — R928 Other abnormal and inconclusive findings on diagnostic imaging of breast: Secondary | ICD-10-CM

## 2015-09-29 ENCOUNTER — Other Ambulatory Visit (HOSPITAL_COMMUNITY): Payer: Self-pay | Admitting: Registered Nurse

## 2015-09-29 ENCOUNTER — Ambulatory Visit (HOSPITAL_COMMUNITY)
Admission: RE | Admit: 2015-09-29 | Discharge: 2015-09-29 | Disposition: A | Discharge status: Home / Self Care | Payer: Medicare Other | Source: Ambulatory Visit | Attending: Vascular Surgery | Admitting: Vascular Surgery

## 2015-09-29 DIAGNOSIS — M7989 Other specified soft tissue disorders: Secondary | ICD-10-CM

## 2015-09-29 DIAGNOSIS — I82491 Acute embolism and thrombosis of other specified deep vein of right lower extremity: Secondary | ICD-10-CM | POA: Insufficient documentation

## 2015-09-29 DIAGNOSIS — I10 Essential (primary) hypertension: Secondary | ICD-10-CM | POA: Diagnosis not present

## 2016-01-25 ENCOUNTER — Encounter: Payer: Self-pay | Admitting: *Deleted

## 2016-01-25 ENCOUNTER — Telehealth: Payer: Self-pay | Admitting: *Deleted

## 2016-01-25 NOTE — Addendum Note (Signed)
Addended by: Dorrene German on: 01/25/2016 03:55 PM   Modules accepted: Medications

## 2016-01-25 NOTE — Telephone Encounter (Signed)
Pre-Visit Call completed with patient and chart updated.   Pre-Visit Info documented in Specialty Comments under [facility].    

## 2016-01-26 ENCOUNTER — Ambulatory Visit (INDEPENDENT_AMBULATORY_CARE_PROVIDER_SITE_OTHER): Payer: Medicare Other | Admitting: Family Medicine

## 2016-01-26 ENCOUNTER — Encounter: Payer: Self-pay | Admitting: Family Medicine

## 2016-01-26 VITALS — BP 120/78 | HR 68 | Temp 97.9°F | Resp 17 | Ht 66.0 in | Wt 164.4 lb

## 2016-01-26 DIAGNOSIS — I10 Essential (primary) hypertension: Secondary | ICD-10-CM | POA: Diagnosis not present

## 2016-01-26 DIAGNOSIS — R200 Anesthesia of skin: Secondary | ICD-10-CM

## 2016-01-26 DIAGNOSIS — R208 Other disturbances of skin sensation: Secondary | ICD-10-CM | POA: Diagnosis not present

## 2016-01-26 DIAGNOSIS — I82401 Acute embolism and thrombosis of unspecified deep veins of right lower extremity: Secondary | ICD-10-CM | POA: Insufficient documentation

## 2016-01-26 DIAGNOSIS — E039 Hypothyroidism, unspecified: Secondary | ICD-10-CM | POA: Diagnosis not present

## 2016-01-26 DIAGNOSIS — I824Y1 Acute embolism and thrombosis of unspecified deep veins of right proximal lower extremity: Secondary | ICD-10-CM | POA: Diagnosis not present

## 2016-01-26 LAB — BASIC METABOLIC PANEL
BUN: 26 mg/dL — ABNORMAL HIGH (ref 7–25)
CO2: 27 mmol/L (ref 20–31)
Calcium: 9.4 mg/dL (ref 8.6–10.4)
Chloride: 102 mmol/L (ref 98–110)
Creat: 0.95 mg/dL — ABNORMAL HIGH (ref 0.60–0.88)
Glucose, Bld: 110 mg/dL — ABNORMAL HIGH (ref 65–99)
Potassium: 4.3 mmol/L (ref 3.5–5.3)
Sodium: 138 mmol/L (ref 135–146)

## 2016-01-26 LAB — CBC WITH DIFFERENTIAL/PLATELET
Basophils Absolute: 0 10*3/uL (ref 0.0–0.1)
Basophils Relative: 0 % (ref 0–1)
Eosinophils Absolute: 0.2 10*3/uL (ref 0.0–0.7)
Eosinophils Relative: 2 % (ref 0–5)
HCT: 40.6 % (ref 36.0–46.0)
Hemoglobin: 13 g/dL (ref 12.0–15.0)
Lymphocytes Relative: 28 % (ref 12–46)
Lymphs Abs: 3.3 10*3/uL (ref 0.7–4.0)
MCH: 30 pg (ref 26.0–34.0)
MCHC: 32 g/dL (ref 30.0–36.0)
MCV: 93.5 fL (ref 78.0–100.0)
MPV: 9.5 fL (ref 8.6–12.4)
Monocytes Absolute: 1.3 10*3/uL — ABNORMAL HIGH (ref 0.1–1.0)
Monocytes Relative: 11 % (ref 3–12)
Neutro Abs: 7 10*3/uL (ref 1.7–7.7)
Neutrophils Relative %: 59 % (ref 43–77)
Platelets: 342 10*3/uL (ref 150–400)
RBC: 4.34 MIL/uL (ref 3.87–5.11)
RDW: 14.9 % (ref 11.5–15.5)
WBC: 11.9 10*3/uL — ABNORMAL HIGH (ref 4.0–10.5)

## 2016-01-26 LAB — LIPID PANEL
Cholesterol: 195 mg/dL (ref 125–200)
HDL: 51 mg/dL (ref >=46)
LDL Cholesterol: 88 mg/dL (ref <130)
Total CHOL/HDL Ratio: 3.8 Ratio (ref <=5.0)
Triglycerides: 280 mg/dL — ABNORMAL HIGH (ref <150)
VLDL: 56 mg/dL — ABNORMAL HIGH (ref <30)

## 2016-01-26 LAB — HEPATIC FUNCTION PANEL
ALT: 14 U/L (ref 6–29)
AST: 15 U/L (ref 10–35)
Albumin: 4.1 g/dL (ref 3.6–5.1)
Alkaline Phosphatase: 107 U/L (ref 33–130)
Bilirubin, Direct: 0.1 mg/dL (ref <=0.2)
Indirect Bilirubin: 0.3 mg/dL (ref 0.2–1.2)
Total Bilirubin: 0.4 mg/dL (ref 0.2–1.2)
Total Protein: 6.7 g/dL (ref 6.1–8.1)

## 2016-01-26 LAB — TSH: TSH: 0.98 mIU/L

## 2016-01-26 NOTE — Patient Instructions (Signed)
Schedule your complete physical in 6 months We'll notify you of your lab results and make any changes if needed Keep up the good work!  You look fantastic!!! We'll call you with your ultrasound appt and your physical therapy appts No medication changes at this time Call with any questions or concerns If you want to join [place] at the new [place] office, any scheduled appointments will automatically transfer and we will see you at [address] [place] [address] [place], [place] [address] (OPENING 3/23) Welcome!  We're glad to have you!!!

## 2016-01-26 NOTE — Progress Notes (Signed)
Pre visit review using our clinic review tool, if applicable. No additional management support is needed unless otherwise documented below in the visit note. 

## 2016-01-26 NOTE — Progress Notes (Signed)
   Subjective:    Patient ID: [name], female    DOB: 12/29/1922, 82 y.o.   MRN: [id number]  HPI New to establish.  Previous MD- Dr Osborne Casco.    L shoulder pain- chronic problem, since fx years ago.  Getting cortisone injxns q3 months w/ Dr Tonita Cong.  R LE DVT- pt was dx'd in November w/ DVT after waking w/ swollen leg.  On Eliquis since 11/18- 4 months.  No hx of previous clots.  Pt is asking how long she needs to take medication.  Pt's 1st [place] was done at [facility].  L leg numbness- pt has had numbness since back surgery in 2012.  Pt has seen Dr Nelva Bush who referred to Dr Early who referred her to neurology.  Neuro said there was nothing to do and PCP started Gabapentin.  Pt reports leg weakness and has difficulty ambulating up stairs in particular.  Pt reports she has already done PT at [facility].  HTN- chronic problem, on Amlodipine and Nadolol.  BP is well controlled today.  No CP, SOB, HAs, visual changes, edema,  Hypothyroid- currently on levothyroxine 46mcg daily.  Denies excessive fatigue, changes to skin/hair/nails, bowel changes.   Review of Systems For ROS see HPI     Objective:   Physical Exam  Constitutional: She is oriented to person, place, and time. She appears well-developed and well-nourished. No distress.  HENT:  Head: Normocephalic and atraumatic.  Eyes: Conjunctivae and EOM are normal. Pupils are equal, round, and reactive to light.  Neck: Normal range of motion. Neck supple. No thyromegaly present.  Cardiovascular: Normal rate, regular rhythm, normal heart sounds and intact distal pulses.   No murmur heard. Pulmonary/Chest: Effort normal and breath sounds normal. No respiratory distress.  Abdominal: Soft. She exhibits no distension. There is no tenderness.  Musculoskeletal: She exhibits no edema.  Lymphadenopathy:    She has no cervical adenopathy.  Neurological: She is alert and oriented to person, place, and time.  Skin: Skin is warm and dry.  Psychiatric: She  has a normal mood and affect. Her behavior is normal.  Vitals reviewed.         Assessment & Plan:

## 2016-01-28 NOTE — Assessment & Plan Note (Signed)
New to provider.  Pt dx'd 11/8- has been on Eliquis since.  Pt is not sure how long to take Eliquis and is asking for repeat US to ensure clot has resolved.  [place] ordered.  If clot has resolved, will stop Eliquis.

## 2016-01-28 NOTE — Assessment & Plan Note (Signed)
New to provider, ongoing for pt.  Well controlled.  Asymptomatic.  Check labs.  No anticipated med changes. 

## 2016-01-28 NOTE — Assessment & Plan Note (Signed)
New to provider, ongoing for pt.  On Gabapentin which has controlled the discomfort but pt has sensation that leg will give out on her and has difficulty climbing stairs.  Will refer back to PT for strengthening exercises.  Pt expressed understanding and is in agreement w/ plan.

## 2016-01-28 NOTE — Assessment & Plan Note (Signed)
New to provider, ongoing for pt.  Currently asymptomatic.  Check labs.  Adjust meds prn  

## 2016-02-01 ENCOUNTER — Encounter: Payer: Self-pay | Admitting: General Practice

## 2016-02-05 ENCOUNTER — Ambulatory Visit (HOSPITAL_COMMUNITY)
Admission: RE | Admit: 2016-02-05 | Discharge: 2016-02-05 | Disposition: A | Discharge status: Home / Self Care | Payer: Medicare Other | Source: Ambulatory Visit | Attending: Family Medicine | Admitting: Family Medicine

## 2016-02-05 DIAGNOSIS — I824Y1 Acute embolism and thrombosis of unspecified deep veins of right proximal lower extremity: Secondary | ICD-10-CM

## 2016-02-07 ENCOUNTER — Telehealth: Payer: Self-pay | Admitting: *Deleted

## 2016-02-07 NOTE — Telephone Encounter (Signed)
Received patient Medical Records; forwarded to provider/SLS 03/29

## 2016-02-08 ENCOUNTER — Telehealth: Payer: Self-pay | Admitting: Family Medicine

## 2016-02-08 DIAGNOSIS — M8580 Other specified disorders of bone density and structure, unspecified site: Secondary | ICD-10-CM | POA: Insufficient documentation

## 2016-02-08 NOTE — Telephone Encounter (Signed)
Called pt- we received records from [place]. She says it is ok to sort out what needs to be scanned/ abstracted and shred the rest

## 2016-02-12 ENCOUNTER — Telehealth: Payer: Self-pay | Admitting: Family Medicine

## 2016-02-12 NOTE — Telephone Encounter (Signed)
Pt calling for US results.

## 2016-02-12 NOTE — Telephone Encounter (Signed)
Called pt and LMOVM to return call.  °

## 2016-02-12 NOTE — Telephone Encounter (Signed)
Pt notified and expressed an understanding.  

## 2016-02-12 NOTE — Telephone Encounter (Signed)
Please advise 

## 2016-02-12 NOTE — Telephone Encounter (Signed)
Please review results w/ pt- no DVT, ok to stop anticoagulation

## 2016-02-19 ENCOUNTER — Encounter (HOSPITAL_COMMUNITY): Payer: Medicare Other

## 2016-02-21 ENCOUNTER — Encounter: Payer: Self-pay | Admitting: Family Medicine

## 2016-02-21 NOTE — Telephone Encounter (Signed)
Pt has already scheduled appt with Dr Raoul Pitch for UTI symptoms.

## 2016-02-22 ENCOUNTER — Ambulatory Visit (INDEPENDENT_AMBULATORY_CARE_PROVIDER_SITE_OTHER): Payer: Medicare Other | Admitting: Family Medicine

## 2016-02-22 ENCOUNTER — Encounter: Payer: Self-pay | Admitting: Family Medicine

## 2016-02-22 VITALS — BP 123/75 | HR 65 | Temp 97.8°F | Resp 20 | Wt 163.5 lb

## 2016-02-22 DIAGNOSIS — R350 Frequency of micturition: Secondary | ICD-10-CM | POA: Diagnosis not present

## 2016-02-22 DIAGNOSIS — R319 Hematuria, unspecified: Secondary | ICD-10-CM | POA: Diagnosis not present

## 2016-02-22 DIAGNOSIS — R300 Dysuria: Secondary | ICD-10-CM

## 2016-02-22 DIAGNOSIS — N390 Urinary tract infection, site not specified: Secondary | ICD-10-CM

## 2016-02-22 LAB — POC URINALSYSI DIPSTICK (AUTOMATED)
Bilirubin, UA: NEGATIVE
Glucose, UA: NEGATIVE
Ketones, UA: NEGATIVE
Nitrite, UA: NEGATIVE
Protein, UA: NEGATIVE
Spec Grav, UA: 1.01
Urobilinogen, UA: 0.2
pH, UA: 6

## 2016-02-22 LAB — URINALYSIS, ROUTINE W REFLEX MICROSCOPIC
Bilirubin Urine: NEGATIVE
Ketones, ur: NEGATIVE
Nitrite: NEGATIVE
Specific Gravity, Urine: 1.01 (ref 1.000–1.030)
Total Protein, Urine: NEGATIVE
Urine Glucose: NEGATIVE
Urobilinogen, UA: 0.2 (ref 0.0–1.0)
pH: 6 (ref 5.0–8.0)

## 2016-02-22 MED ORDER — CEPHALEXIN 500 MG PO CAPS: 500.0000 mg | ORAL_CAPSULE | Freq: Four times a day (QID) | ORAL | Status: DC

## 2016-02-22 NOTE — Progress Notes (Signed)
Patient ID: [name], female   DOB: 1923-10-16, 82 y.o.   MRN: [id number]    [name] , 11/14/1922, 82 y.o., female MRN: [id number]  CC: UTI symtoms Subjective: Pt presents for an acute OV with complaints of dysuria for 3 days in duration. Associated symptoms include urinary frequency, dysuria and 1 episode of vertigo. She has had vertigo before. She denies fever, chills, nausea, vomit, abd pain. Pt has tried nothing except to hydrate more,  to ease their symptoms.  Pt does not have a history of kidney stones. She has  had a UTI in the past.    Allergies  Allergen Reactions  . Codeine Other (See Comments)    Nightmares, imagining things   Social History  Substance Use Topics  . Smoking status: Never Smoker   . Smokeless tobacco: Never Used  . Alcohol Use: No   Past Medical History  Diagnosis Date  . Hypertension   . Hypothyroidism   . GERD (gastroesophageal reflux disease)   . Peripheral vascular disease ([place])     peripheral neuropathy  . DVT (deep venous thrombosis) ([place]) 09/2015    RLE   Past Surgical History  Procedure Laterality Date  . Tonsillectomy    . Appendectomy    . Abdominal hysterectomy    . Other surgical history      left wrist surgery - has plate in left wrist   . Other surgical history      right knee surgery due to torn cartilage   . Lumbar laminectomy/decompression microdiscectomy  11/20/2011    Procedure: LUMBAR LAMINECTOMY/DECOMPRESSION MICRODISCECTOMY;  Surgeon: Jeneen Rinks P [place];  Location: [facility];  Service: Orthopedics;  Laterality: N/A;  Decompressive Laminectomy L2 to the Sacrum (X-Ray)  . Back surgery    . Eye surgery      cataract  . Breast surgery      left breast biopsy  . Wrist fracture surgery Left   . Lipoma excision Left 04/25/2015    Procedure: EXCISION OF LIPOMA LEFT ARM;  Surgeon: Donnie Mesa, MD;  Location: [facility];  Service: General;  Laterality: Left;   Family History  Problem Relation Age of Onset    . Cancer Mother     BREAST  . COPD Father   . Emphysema Father      Medication List       This list is accurate as of: 02/22/16  8:33 AM.  Always use your most recent med list.               amLODipine 10 MG tablet  Commonly known as:  NORVASC  Take 10 mg by mouth daily before breakfast.     gabapentin 300 MG capsule  Commonly known as:  NEURONTIN  Take 300 mg by mouth at bedtime.     levothyroxine 50 MCG tablet  Commonly known as:  SYNTHROID, LEVOTHROID  Take 50 mcg by mouth daily before breakfast.     nadolol 80 MG tablet  Commonly known as:  CORGARD  Take 80 mg by mouth daily before breakfast.     omeprazole 20 MG capsule  Commonly known as:  PRILOSEC  Take 20 mg by mouth daily before breakfast.     TYLENOL PO  Take by mouth as needed.     Vitamin D (Ergocalciferol) 50000 units Caps capsule  Commonly known as:  DRISDOL  Take 50,000 Units by mouth every 7 (seven) days. Mondays.       ROS: Negative, with the  exception of above mentioned in HPI  Objective:  There were no vitals taken for this visit. There is no weight on file to calculate BMI. Gen: Afebrile. No acute distress. Nontoxic in appearance well developed, well nourished female. Very pleasant.  HENT: AT. [place].  MMM, no oral lesions. Eyes:Pupils Equal Round Reactive to light, Extraocular movements intact,  Conjunctiva without redness, discharge or icterus. Abd: Soft. obese.ND. TTP suprapubic area. BS present.  MSK: No CVA tenderness. Neuro: Normal gait. PERLA. EOMi. Alert. Oriented x3   Assessment/Plan: [name] is a 82 y.o. female present for acute OV for  Urinary frequency/dysuria/ UTI - POCT Urinalysis Dipstick (Automated): + leuks, + blood. - Urinalysis, Routine w reflex microscopic - Urine Culture - cephALEXin (KEFLEX) 500 MG capsule; Take 1 capsule (500 mg total) by mouth 4 (four) times daily.  Dispense: 28 capsule; Refill: 0 - F/U PRN  electronically signed by:  Howard Pouch, DO   [place]

## 2016-02-22 NOTE — Patient Instructions (Signed)
You have a UTI. I will call you with the culture once available.  I have called in Keflex to cover the infection.

## 2016-02-24 LAB — URINE CULTURE
Colony Count: NO GROWTH
Organism ID, Bacteria: NO GROWTH

## 2016-02-26 ENCOUNTER — Telehealth: Payer: Self-pay | Admitting: Family Medicine

## 2016-02-26 DIAGNOSIS — R8290 Unspecified abnormal findings in urine: Secondary | ICD-10-CM

## 2016-02-26 NOTE — Telephone Encounter (Signed)
Spoke with patient reviewed lab results and instructions. Patient will call Dr Virgil Benedict office to schedule lab appt for repeat urine after completion of antibiotics.

## 2016-02-26 NOTE — Telephone Encounter (Signed)
Please call pt: - her urinalysis confirmed bacteria and signs of infection.  - However her culture was negative.  - As long as her symptoms are improving on the current medication (kelfex) she is to complete the course.  - I would like her to have f/u urine test to ensure abnormalities cleared (considering odd results of loaded bacteria/leuks and negative culture) in 2 weeks. Lab appt only unless symptoms are not resolved). - Order placed.

## 2016-03-02 ENCOUNTER — Encounter: Payer: Self-pay | Admitting: Family Medicine

## 2016-03-04 ENCOUNTER — Emergency Department (HOSPITAL_COMMUNITY)
Admission: EM | Admit: 2016-03-04 | Discharge: 2016-03-04 | Disposition: A | Discharge status: Home / Self Care | Payer: Medicare Other | Attending: Emergency Medicine | Admitting: Emergency Medicine

## 2016-03-04 ENCOUNTER — Encounter (HOSPITAL_COMMUNITY): Payer: Self-pay | Admitting: *Deleted

## 2016-03-04 ENCOUNTER — Emergency Department (HOSPITAL_COMMUNITY): Payer: Medicare Other

## 2016-03-04 DIAGNOSIS — Z79899 Other long term (current) drug therapy: Secondary | ICD-10-CM | POA: Insufficient documentation

## 2016-03-04 DIAGNOSIS — R1013 Epigastric pain: Secondary | ICD-10-CM

## 2016-03-04 DIAGNOSIS — I10 Essential (primary) hypertension: Secondary | ICD-10-CM | POA: Diagnosis not present

## 2016-03-04 DIAGNOSIS — K219 Gastro-esophageal reflux disease without esophagitis: Secondary | ICD-10-CM | POA: Diagnosis not present

## 2016-03-04 DIAGNOSIS — Z86018 Personal history of other benign neoplasm: Secondary | ICD-10-CM | POA: Insufficient documentation

## 2016-03-04 DIAGNOSIS — R079 Chest pain, unspecified: Secondary | ICD-10-CM

## 2016-03-04 DIAGNOSIS — Z792 Long term (current) use of antibiotics: Secondary | ICD-10-CM | POA: Diagnosis not present

## 2016-03-04 DIAGNOSIS — Z7982 Long term (current) use of aspirin: Secondary | ICD-10-CM | POA: Diagnosis not present

## 2016-03-04 DIAGNOSIS — E039 Hypothyroidism, unspecified: Secondary | ICD-10-CM | POA: Diagnosis not present

## 2016-03-04 LAB — HEPATIC FUNCTION PANEL
ALT: 14 U/L (ref 14–54)
AST: 14 U/L — ABNORMAL LOW (ref 15–41)
Albumin: 3.2 g/dL — ABNORMAL LOW (ref 3.5–5.0)
Alkaline Phosphatase: 91 U/L (ref 38–126)
Bilirubin, Direct: <0.1 mg/dL — ABNORMAL LOW (ref 0.1–0.5)
Total Bilirubin: 0.3 mg/dL (ref 0.3–1.2)
Total Protein: 5.9 g/dL — ABNORMAL LOW (ref 6.5–8.1)

## 2016-03-04 LAB — URINALYSIS, ROUTINE W REFLEX MICROSCOPIC
Bilirubin Urine: NEGATIVE
Glucose, UA: NEGATIVE mg/dL
Hgb urine dipstick: NEGATIVE
Ketones, ur: NEGATIVE mg/dL
Leukocytes, UA: NEGATIVE
Nitrite: NEGATIVE
Protein, ur: NEGATIVE mg/dL
Specific Gravity, Urine: 1.009 (ref 1.005–1.030)
pH: 6.5 (ref 5.0–8.0)

## 2016-03-04 LAB — BASIC METABOLIC PANEL
Anion gap: 9 (ref 5–15)
BUN: 18 mg/dL (ref 6–20)
CO2: 24 mmol/L (ref 22–32)
Calcium: 8.7 mg/dL — ABNORMAL LOW (ref 8.9–10.3)
Chloride: 109 mmol/L (ref 101–111)
Creatinine, Ser: 0.95 mg/dL (ref 0.44–1.00)
GFR calc Af Amer: 59 mL/min — ABNORMAL LOW (ref >60)
GFR calc non Af Amer: 51 mL/min — ABNORMAL LOW (ref >60)
Glucose, Bld: 108 mg/dL — ABNORMAL HIGH (ref 65–99)
Potassium: 4.1 mmol/L (ref 3.5–5.1)
Sodium: 142 mmol/L (ref 135–145)

## 2016-03-04 LAB — LIPASE, BLOOD: Lipase: 86 U/L — ABNORMAL HIGH (ref 11–51)

## 2016-03-04 LAB — TROPONIN I: Troponin I: <0.03 ng/mL (ref <0.031)

## 2016-03-04 LAB — CBC
HCT: 38.4 % (ref 36.0–46.0)
Hemoglobin: 12.2 g/dL (ref 12.0–15.0)
MCH: 29.9 pg (ref 26.0–34.0)
MCHC: 31.8 g/dL (ref 30.0–36.0)
MCV: 94.1 fL (ref 78.0–100.0)
Platelets: 280 10*3/uL (ref 150–400)
RBC: 4.08 MIL/uL (ref 3.87–5.11)
RDW: 13.3 % (ref 11.5–15.5)
WBC: 11.7 10*3/uL — ABNORMAL HIGH (ref 4.0–10.5)

## 2016-03-04 LAB — I-STAT TROPONIN, ED: Troponin i, poc: 0 ng/mL (ref 0.00–0.08)

## 2016-03-04 MED ORDER — HYDROCODONE-ACETAMINOPHEN 5-325 MG PO TABS: 1.0000 | ORAL_TABLET | ORAL | Status: DC | PRN

## 2016-03-04 MED ORDER — IOPAMIDOL (ISOVUE-300) INJECTION 61%
INTRAVENOUS | Status: AC
  Administered 2016-03-04: 100 mL
  Filled 2016-03-04: qty 100

## 2016-03-04 MED ORDER — PANTOPRAZOLE SODIUM 40 MG IV SOLR
40.0000 mg | Freq: Once | INTRAVENOUS | Status: AC
  Administered 2016-03-04: 40 mg via INTRAVENOUS
  Filled 2016-03-04: qty 40

## 2016-03-04 MED ORDER — ASPIRIN 81 MG PO CHEW: 81.0000 mg | CHEWABLE_TABLET | Freq: Every day | ORAL | Status: DC

## 2016-03-04 MED ORDER — FAMOTIDINE 20 MG PO TABS: 20.0000 mg | ORAL_TABLET | Freq: Two times a day (BID) | ORAL | Status: DC

## 2016-03-04 MED ORDER — DOCUSATE SODIUM 100 MG PO CAPS: 100.0000 mg | ORAL_CAPSULE | Freq: Two times a day (BID) | ORAL | Status: DC

## 2016-03-04 MED ORDER — OMEPRAZOLE 20 MG PO CPDR: 20.0000 mg | DELAYED_RELEASE_CAPSULE | Freq: Every day | ORAL | Status: DC

## 2016-03-04 NOTE — Discharge Instructions (Signed)
Acute Pancreatitis You have a mild elevation in the lab work associated with your pancreas. At this time, your CT scan does not show inflammation or significant problems around the pancreas. This may be early pancreatitis, follow all these instructions for your diet and return if any worsening of your symptoms. Acute pancreatitis is a disease in which the pancreas becomes suddenly inflamed. The pancreas is a large gland located behind your stomach. The pancreas produces enzymes that help digest food. The pancreas also releases the hormones glucagon and insulin that help regulate blood sugar. Damage to the pancreas occurs when the digestive enzymes from the pancreas are activated and begin attacking the pancreas before being released into the intestine. Most acute attacks last a couple of days and can cause serious complications. Some people become dehydrated and develop low blood pressure. In severe cases, bleeding into the pancreas can lead to shock and can be life-threatening. The lungs, heart, and kidneys may fail. CAUSES  Pancreatitis can happen to anyone. In some cases, the cause is unknown. Most cases are caused by:  Alcohol abuse.  Gallstones. Other less common causes are:  Certain medicines.  Exposure to certain chemicals.  Infection.  Damage caused by an accident (trauma).  Abdominal surgery. SYMPTOMS   Pain in the upper abdomen that may radiate to the back.  Tenderness and swelling of the abdomen.  Nausea and vomiting. DIAGNOSIS  Your caregiver will perform a physical exam. Blood and stool tests may be done to confirm the diagnosis. Imaging tests may also be done, such as X-rays, CT scans, or an ultrasound of the abdomen. TREATMENT  Treatment usually requires a stay in the hospital. Treatment may include:  Pain medicine.  Fluid replacement through an intravenous line (IV).  Placing a tube in the stomach to remove stomach contents and control vomiting.  Not eating for 3  or 4 days. This gives your pancreas a rest, because enzymes are not being produced that can cause further damage.  Antibiotic medicines if your condition is caused by an infection.  Surgery of the pancreas or gallbladder. HOME CARE INSTRUCTIONS   Follow the diet advised by your caregiver. This may involve avoiding alcohol and decreasing the amount of fat in your diet.  Eat smaller, more frequent meals. This reduces the amount of digestive juices the pancreas produces.  Drink enough fluids to keep your urine clear or pale yellow.  Only take over-the-counter or prescription medicines as directed by your caregiver.  Avoid drinking alcohol if it caused your condition.  Do not smoke.  Get plenty of rest.  Check your blood sugar at home as directed by your caregiver.  Keep all follow-up appointments as directed by your caregiver. SEEK MEDICAL CARE IF:   You do not recover as quickly as expected.  You develop new or worsening symptoms.  You have persistent pain, weakness, or nausea.  You recover and then have another episode of pain. SEEK IMMEDIATE MEDICAL CARE IF:   You are unable to eat or keep fluids down.  Your pain becomes severe.  You have a fever or persistent symptoms for more than 2 to 3 days.  You have a fever and your symptoms suddenly get worse.  Your skin or the white part of your eyes turn yellow (jaundice).  You develop vomiting.  You feel dizzy, or you faint.  Your blood sugar is high (over 300 mg/dL). MAKE SURE YOU:   Understand these instructions.  Will watch your condition.  Will get help right  away if you are not doing well or get worse.   This information is not intended to replace advice given to you by your health care provider. Make sure you discuss any questions you have with your health care provider.   Document Released: 10/28/2005 Document Revised: 04/28/2012 Document Reviewed: 02/06/2012 Elsevier Interactive Patient Education 2016  Elsevier [employer] Gastritis, Adult You have recently finished a course of antibiotics. This can cause irritation of your stomach lining. Continue your Prilosec and add Pepcid twice daily. Gastritis is soreness and swelling (inflammation) of the lining of the stomach. Gastritis can develop as a sudden onset (acute) or long-term (chronic) condition. If gastritis is not treated, it can lead to stomach bleeding and ulcers. CAUSES  Gastritis occurs when the stomach lining is weak or damaged. Digestive juices from the stomach then inflame the weakened stomach lining. The stomach lining may be weak or damaged due to viral or bacterial infections. One common bacterial infection is the Helicobacter pylori infection. Gastritis can also result from excessive alcohol consumption, taking certain medicines, or having too much acid in the stomach.  SYMPTOMS  In some cases, there are no symptoms. When symptoms are present, they may include:  Pain or a burning sensation in the upper abdomen.  Nausea.  Vomiting.  An uncomfortable feeling of fullness after eating. DIAGNOSIS  Your caregiver may suspect you have gastritis based on your symptoms and a physical exam. To determine the cause of your gastritis, your caregiver may perform the following:  Blood or stool tests to check for the H pylori bacterium.  Gastroscopy. A thin, flexible tube (endoscope) is passed down the esophagus and into the stomach. The endoscope has a light and camera on the end. Your caregiver uses the endoscope to view the inside of the stomach.  Taking a tissue sample (biopsy) from the stomach to examine under a microscope. TREATMENT  Depending on the cause of your gastritis, medicines may be prescribed. If you have a bacterial infection, such as an H pylori infection, antibiotics may be given. If your gastritis is caused by too much acid in the stomach, H2 blockers or antacids may be given. Your caregiver may recommend that you stop taking  aspirin, ibuprofen, or other nonsteroidal anti-inflammatory drugs (NSAIDs). HOME CARE INSTRUCTIONS  Only take over-the-counter or prescription medicines as directed by your caregiver.  If you were given antibiotic medicines, take them as directed. Finish them even if you start to feel better.  Drink enough fluids to keep your urine clear or pale yellow.  Avoid foods and drinks that make your symptoms worse, such as:  Caffeine or alcoholic drinks.  Chocolate.  Peppermint or mint flavorings.  Garlic and onions.  Spicy foods.  Citrus fruits, such as oranges, lemons, or limes.  Tomato-based foods such as sauce, chili, salsa, and pizza.  Fried and fatty foods.  Eat small, frequent meals instead of large meals. SEEK IMMEDIATE MEDICAL CARE IF:   You have black or dark red stools.  You vomit blood or material that looks like coffee grounds.  You are unable to keep fluids down.  Your abdominal pain gets worse.  You have a fever.  You do not feel better after 1 week.  You have any other questions or concerns. MAKE SURE YOU:  Understand these instructions.  Will watch your condition.  Will get help right away if you are not doing well or get worse.   This information is not intended to replace advice given to you by your  health care provider. Make sure you discuss any questions you have with your health care provider.   Document Released: 10/22/2001 Document Revised: 04/28/2012 Document Reviewed: 12/11/2011 Elsevier Interactive Patient Education 2016 Elsevier Inc. Nonspecific Chest Pain  Your CT scan shows that you have developed atherosclerosis or "hardening of the arteries". This condition occurs with aging. Take a daily aspirin. See your family doctor to discuss scheduling a stress test if your doctor feels it is appropriate for you. Return to the emergency department if you develop new or concerning symptoms. Chest pain can be caused by many different conditions.  There is always a chance that your pain could be related to something serious, such as a heart attack or a blood clot in your lungs. Chest pain can also be caused by conditions that are not life-threatening. If you have chest pain, it is very important to follow up with your health care provider. CAUSES  Chest pain can be caused by:  Heartburn.  Pneumonia or bronchitis.  Anxiety or stress.  Inflammation around your heart (pericarditis) or lung (pleuritis or pleurisy).  A blood clot in your lung.  A collapsed lung (pneumothorax). It can develop suddenly on its own (spontaneous pneumothorax) or from trauma to the chest.  Shingles infection (varicella-zoster virus).  Heart attack.  Damage to the bones, muscles, and cartilage that make up your chest wall. This can include:  Bruised bones due to injury.  Strained muscles or cartilage due to frequent or repeated coughing or overwork.  Fracture to one or more ribs.  Sore cartilage due to inflammation (costochondritis). RISK FACTORS  Risk factors for chest pain may include:  Activities that increase your risk for trauma or injury to your chest.  Respiratory infections or conditions that cause frequent coughing.  Medical conditions or overeating that can cause heartburn.  Heart disease or family history of heart disease.  Conditions or health behaviors that increase your risk of developing a blood clot.  Having had chicken pox (varicella zoster). SIGNS AND SYMPTOMS Chest pain can feel like:  Burning or tingling on the surface of your chest or deep in your chest.  Crushing, pressure, aching, or squeezing pain.  Dull or sharp pain that is worse when you move, cough, or take a deep breath.  Pain that is also felt in your back, neck, shoulder, or arm, or pain that spreads to any of these areas. Your chest pain may come and go, or it may stay constant. DIAGNOSIS Lab tests or other studies may be needed to find the cause of  your pain. Your health care provider may have you take a test called an ambulatory ECG (electrocardiogram). An ECG records your heartbeat patterns at the time the test is performed. You may also have other tests, such as:  Transthoracic echocardiogram (TTE). During echocardiography, sound waves are used to create a picture of all of the heart structures and to look at how blood flows through your heart.  Transesophageal echocardiogram (TEE).This is a more advanced imaging test that obtains images from inside your body. It allows your health care provider to see your heart in finer detail.  Cardiac monitoring. This allows your health care provider to monitor your heart rate and rhythm in real time.  Holter monitor. This is a portable device that records your heartbeat and can help to diagnose abnormal heartbeats. It allows your health care provider to track your heart activity for several days, if needed.  Stress tests. These can be done through exercise or  by taking medicine that makes your heart beat more quickly.  Blood tests.  Imaging tests. TREATMENT  Your treatment depends on what is causing your chest pain. Treatment may include:  Medicines. These may include:  Acid blockers for heartburn.  Anti-inflammatory medicine.  Pain medicine for inflammatory conditions.  Antibiotic medicine, if an infection is present.  Medicines to dissolve blood clots.  Medicines to treat coronary artery disease.  Supportive care for conditions that do not require medicines. This may include:  Resting.  Applying heat or cold packs to injured areas.  Limiting activities until pain decreases. HOME CARE INSTRUCTIONS  If you were prescribed an antibiotic medicine, finish it all even if you start to feel better.  Avoid any activities that bring on chest pain.  Do not use any tobacco products, including cigarettes, chewing tobacco, or electronic cigarettes. If you need help quitting, ask your  health care provider.  Do not drink alcohol.  Take medicines only as directed by your health care provider.  Keep all follow-up visits as directed by your health care provider. This is important. This includes any further testing if your chest pain does not go away.  If heartburn is the cause for your chest pain, you may be told to keep your head raised (elevated) while sleeping. This reduces the chance that acid will go from your stomach into your esophagus.  Make lifestyle changes as directed by your health care provider. These may include:  Getting regular exercise. Ask your health care provider to suggest some activities that are safe for you.  Eating a heart-healthy diet. A registered dietitian can help you to learn healthy eating options.  Maintaining a healthy weight.  Managing diabetes, if necessary.  Reducing stress. SEEK MEDICAL CARE IF:  Your chest pain does not go away after treatment.  You have a rash with blisters on your chest.  You have a fever. SEEK IMMEDIATE MEDICAL CARE IF:   Your chest pain is worse.  You have an increasing cough, or you cough up blood.  You have severe abdominal pain.  You have severe weakness.  You faint.  You have chills.  You have sudden, unexplained chest discomfort.  You have sudden, unexplained discomfort in your arms, back, neck, or jaw.  You have shortness of breath at any time.  You suddenly start to sweat, or your skin gets clammy.  You feel nauseous or you vomit.  You suddenly feel light-headed or dizzy.  Your heart begins to beat quickly, or it feels like it is skipping beats. These symptoms may represent a serious problem that is an emergency. Do not wait to see if the symptoms will go away. Get medical help right away. Call your local emergency services (911 in the [place] Do not drive yourself to the hospital.   This information is not intended to replace advice given to you by your health care provider. Make  sure you discuss any questions you have with your health care provider.   Document Released: 08/07/2005 Document Revised: 11/18/2014 Document Reviewed: 06/03/2014 Elsevier Interactive Patient Education [employer].

## 2016-03-04 NOTE — ED Notes (Signed)
Patient transported to X-ray 

## 2016-03-04 NOTE — ED Provider Notes (Signed)
CSN: [id number]     Arrival date & time 03/04/16  0908 History   First MD Initiated Contact with Patient 03/04/16 0940     Chief Complaint  Patient presents with  . Chest Pain     (Consider location/radiation/quality/duration/timing/severity/associated sxs/prior Treatment) HPI Patient has had chest pain that started at about 5 in the morning. She indicates discomfort in the epigastrium that radiates up into the chest. It has been spasmodic and coming in waves. There are certain types of position changes that seem to exacerbate it. No associated diaphoresis. No radiation into the back or the shoulders. No shortness of breath. No vomiting. Past Medical History  Diagnosis Date  . Hypertension   . Hypothyroidism   . GERD (gastroesophageal reflux disease)   . Peripheral vascular disease ([place])     peripheral neuropathy  . DVT (deep venous thrombosis) ([place]) 09/2015    RLE   Past Surgical History  Procedure Laterality Date  . Tonsillectomy    . Appendectomy    . Abdominal hysterectomy    . Other surgical history      left wrist surgery - has plate in left wrist   . Other surgical history      right knee surgery due to torn cartilage   . Lumbar laminectomy/decompression microdiscectomy  11/20/2011    Procedure: LUMBAR LAMINECTOMY/DECOMPRESSION MICRODISCECTOMY;  Surgeon: Jeneen Rinks P [place];  Location: [facility];  Service: Orthopedics;  Laterality: N/A;  Decompressive Laminectomy L2 to the Sacrum (X-Ray)  . Back surgery    . Eye surgery      cataract  . Breast surgery      left breast biopsy  . Wrist fracture surgery Left   . Lipoma excision Left 04/25/2015    Procedure: EXCISION OF LIPOMA LEFT ARM;  Surgeon: Donnie Mesa, MD;  Location: [facility];  Service: General;  Laterality: Left;   Family History  Problem Relation Age of Onset  . Cancer Mother     BREAST  . COPD Father   . Emphysema Father    Social History  Substance Use Topics  . Smoking status: Never Smoker    . Smokeless tobacco: Never Used  . Alcohol Use: No   OB History    No data available     Review of Systems  10 Systems reviewed and are negative for acute change except as noted in the HPI.  Allergies  Codeine and Keflex  Home Medications   Prior to Admission medications   Medication Sig Start Date End Date Taking? Authorizing Provider  Acetaminophen (TYLENOL PO) Take by mouth as needed.   Yes Historical Provider, MD  amLODipine (NORVASC) 10 MG tablet Take 10 mg by mouth daily before breakfast.    Yes Historical Provider, MD  gabapentin (NEURONTIN) 300 MG capsule Take 300 mg by mouth at bedtime.   Yes Historical Provider, MD  levothyroxine (SYNTHROID, LEVOTHROID) 50 MCG tablet Take 50 mcg by mouth daily before breakfast.    Yes Historical Provider, MD  nadolol (CORGARD) 80 MG tablet Take 80 mg by mouth daily before breakfast.    Yes Historical Provider, MD  omeprazole (PRILOSEC) 20 MG capsule Take 20 mg by mouth daily before breakfast.    Yes Historical Provider, MD  ranitidine (ZANTAC) 150 MG capsule Take 150 mg by mouth 2 (two) times daily.   Yes Historical Provider, MD  Vitamin D, Ergocalciferol, (DRISDOL) 50000 UNITS CAPS Take 50,000 Units by mouth every 7 (seven) days. Mondays.   Yes Historical Provider, MD  amoxicillin (AMOXIL) 875 MG tablet Take 1 tablet (875 mg total) by mouth 2 (two) times daily. 03/06/16   [name], MD  aspirin 81 MG chewable tablet Chew 1 tablet (81 mg total) by mouth daily. 03/04/16   Charlesetta Shanks, MD  docusate sodium (COLACE) 100 MG capsule Take 1 capsule (100 mg total) by mouth every 12 (twelve) hours. 03/04/16   Charlesetta Shanks, MD  famotidine (PEPCID) 20 MG tablet Take 1 tablet (20 mg total) by mouth 2 (two) times daily. 03/04/16   Charlesetta Shanks, MD  HYDROcodone-acetaminophen (NORCO/VICODIN) 5-325 MG tablet Take 1-2 tablets by mouth every 4 (four) hours as needed for moderate pain or severe pain. 03/04/16   Charlesetta Shanks, MD  omeprazole  (PRILOSEC) 20 MG capsule Take 1 capsule (20 mg total) by mouth daily. 03/04/16   Charlesetta Shanks, MD   BP 130/77 mmHg  Pulse 72  Temp(Src) 97.8 F (36.6 C) (Oral)  Resp 18  SpO2 97% Physical Exam  Constitutional: She is oriented to person, place, and time. She appears well-developed and well-nourished.  HENT:  Head: Normocephalic and atraumatic.  Eyes: EOM are normal. Pupils are equal, round, and reactive to light.  Neck: Neck supple.  Cardiovascular: Normal rate, regular rhythm, normal heart sounds and intact distal pulses.   Pulmonary/Chest: Effort normal and breath sounds normal.  Abdominal: Soft. Bowel sounds are normal. She exhibits no distension. There is no tenderness.  Musculoskeletal: Normal range of motion. She exhibits no edema.  Neurological: She is alert and oriented to person, place, and time. She has normal strength. Coordination normal. GCS eye subscore is 4. GCS verbal subscore is 5. GCS motor subscore is 6.  Skin: Skin is warm, dry and intact.  Psychiatric: She has a normal mood and affect.    ED Course  Procedures (including critical care time) Labs Review Labs Reviewed  BASIC METABOLIC PANEL - Abnormal; Notable for the following:    Glucose, Bld 108 (*)    Calcium 8.7 (*)    GFR calc non Af Amer 51 (*)    GFR calc Af Amer 59 (*)    All other components within normal limits  CBC - Abnormal; Notable for the following:    WBC 11.7 (*)    All other components within normal limits  LIPASE, BLOOD - Abnormal; Notable for the following:    Lipase 86 (*)    All other components within normal limits  HEPATIC FUNCTION PANEL - Abnormal; Notable for the following:    Total Protein 5.9 (*)    Albumin 3.2 (*)    AST 14 (*)    Bilirubin, Direct <0.1 (*)    All other components within normal limits  URINALYSIS, ROUTINE W REFLEX MICROSCOPIC (NOT AT [facility])  TROPONIN I  I-STAT TROPOININ, ED    Imaging Review No results found. I have personally reviewed and evaluated  these images and lab results as part of my medical decision-making.   EKG Interpretation   Date/Time:  Monday March 04 2016 09:16:19 EDT Ventricular Rate:  70 PR Interval:  208 QRS Duration: 83 QT Interval:  382 QTC Calculation: 412 R Axis:   -49 Text Interpretation:  Sinus rhythm Left anterior fascicular block No  significant change since last tracing Confirmed by STEINL  MD, Lennette Bihari  ([id number]) on 03/06/2016 4:42:49 PM      MDM   Final diagnoses:  Chest pain, unspecified chest pain type  Epigastric pain   Patient has mild elevation in lipase. She does have  significant epigastric pain. At this time patient will be counseled on dietary measures for pancreatitis. She will also be given medication for pain. Symptoms also have suggestion of gastritis. Patient will be started on PPI. Pain is atypical for cardiac ischemic type pain. She is however counseled on signs and symptoms for which to return.    Charlesetta Shanks, MD 03/13/16 [phone]

## 2016-03-04 NOTE — ED Notes (Signed)
Pt presents via [facility] from home, woke up at 5am with chest discomfort, around 7am took Zantac then called EMS.  Pt took 2 81 mg at home, when she called 911, they instructed her to take 4 81 mg ASA, pt has had 6 total 81mg  ASA.  EMS administered 2 NTG, 1st with some relief, 2nd with no relief and dropped BP to 90s/60s.  Pt describes as centralized pressure, worse with movement of shoulders.  Hx: HTN, GERD, DVT, PVD.  18 g RAC.  Pt a x 4, NAD on arrival.

## 2016-03-06 ENCOUNTER — Encounter: Payer: Self-pay | Admitting: Family Medicine

## 2016-03-06 ENCOUNTER — Ambulatory Visit (INDEPENDENT_AMBULATORY_CARE_PROVIDER_SITE_OTHER): Payer: Medicare Other | Admitting: Family Medicine

## 2016-03-06 VITALS — BP 122/70 | HR 73 | Temp 98.1°F | Resp 16 | Ht 66.0 in | Wt 162.1 lb

## 2016-03-06 DIAGNOSIS — R748 Abnormal levels of other serum enzymes: Secondary | ICD-10-CM

## 2016-03-06 DIAGNOSIS — J0100 Acute maxillary sinusitis, unspecified: Secondary | ICD-10-CM

## 2016-03-06 DIAGNOSIS — R072 Precordial pain: Secondary | ICD-10-CM | POA: Diagnosis not present

## 2016-03-06 DIAGNOSIS — R079 Chest pain, unspecified: Secondary | ICD-10-CM | POA: Insufficient documentation

## 2016-03-06 LAB — LIPASE: Lipase: 25 U/L (ref 11.0–59.0)

## 2016-03-06 LAB — CBC WITH DIFFERENTIAL/PLATELET
Basophils Absolute: 0 10*3/uL (ref 0.0–0.1)
Basophils Relative: 0.5 % (ref 0.0–3.0)
Eosinophils Absolute: 0.3 10*3/uL (ref 0.0–0.7)
Eosinophils Relative: 3.5 % (ref 0.0–5.0)
HCT: 37.3 % (ref 36.0–46.0)
Hemoglobin: 12.4 g/dL (ref 12.0–15.0)
Lymphocytes Relative: 21.6 % (ref 12.0–46.0)
Lymphs Abs: 2.1 10*3/uL (ref 0.7–4.0)
MCHC: 33.2 g/dL (ref 30.0–36.0)
MCV: 92.3 fl (ref 78.0–100.0)
Monocytes Absolute: 0.9 10*3/uL (ref 0.1–1.0)
Monocytes Relative: 9.1 % (ref 3.0–12.0)
Neutro Abs: 6.3 10*3/uL (ref 1.4–7.7)
Neutrophils Relative %: 65.3 % (ref 43.0–77.0)
Platelets: 330 10*3/uL (ref 150.0–400.0)
RBC: 4.04 Mil/uL (ref 3.87–5.11)
RDW: 13.7 % (ref 11.5–15.5)
WBC: 9.6 10*3/uL (ref 4.0–10.5)

## 2016-03-06 MED ORDER — AMOXICILLIN 875 MG PO TABS: 875.0000 mg | ORAL_TABLET | Freq: Two times a day (BID) | ORAL | Status: DC

## 2016-03-06 NOTE — Patient Instructions (Signed)
Follow up as needed We'll notify you of your lab results and make any changes if needed Start the Amoxicillin twice daily- take w/ food Drink plenty of fluids Add an OTC allergy medication like Claritin or Zyrtec Continue the Pepcid until you run out Continue the once daily baby aspirin Call with any questions or concerns Hang in there!!! Happy Early [place]!!!

## 2016-03-06 NOTE — Assessment & Plan Note (Signed)
New.  Pt's sxs and PE consistent w/ infxn.  Start Amoxicillin.  Reviewed supportive care and red flags that should prompt return.  Pt expressed understanding and is in agreement w/ plan.

## 2016-03-06 NOTE — Progress Notes (Signed)
   Subjective:    Patient ID: [name], female    DOB: 03/01/1923, 82 y.o.   MRN: [id number]  HPI CP- pt was seen in ER on 4/24 after developing CP at 5am.  Pain described as a 'pressure'.  Took Zantac and other home remedies w/o improvement.  Called EMS at 8 when she continued to have pain.  Had (-) troponin, no acute EKG changes.  Normal CXR.  CT abd pelvis showed wedge shaped low density in R hepatic lobe and large R renal cyst but no other abnormalities to explain pain.  Pt reports she is currently feeling well.  Pt did have mildly elevated Lipase and WBC.  Pt has hx of GERD- taking Prilosec.  Denies CP w/ exertion.  Pt was completing a course of Keflex and found this really upset her stomach.  L sided sinus pressure- sxs started 1-2 weeks ago.  Pain behind L eye and in L ear.  No fevers.  Pt has hx of sinus infections.  Not currently on allergy medication.   Review of Systems For ROS see HPI     Objective:   Physical Exam  Constitutional: She is oriented to person, place, and time. She appears well-developed and well-nourished. No distress.  HENT:  Head: Normocephalic and atraumatic.  Right Ear: Tympanic membrane normal.  Left Ear: Tympanic membrane is erythematous and retracted.  Nose: Mucosal edema and rhinorrhea present. Right sinus exhibits no maxillary sinus tenderness and no frontal sinus tenderness. Left sinus exhibits maxillary sinus tenderness. Left sinus exhibits no frontal sinus tenderness.  Mouth/Throat: Uvula is midline and mucous membranes are normal. Posterior oropharyngeal erythema present. No oropharyngeal exudate.  Eyes: Conjunctivae and EOM are normal. Pupils are equal, round, and reactive to light.  Neck: Normal range of motion. Neck supple.  Cardiovascular: Normal rate, regular rhythm and normal heart sounds.   Pulmonary/Chest: Effort normal and breath sounds normal. No respiratory distress. She has no wheezes. She exhibits tenderness (TTP over central  sternum).  Lymphadenopathy:    She has no cervical adenopathy.  Neurological: She is alert and oriented to person, place, and time.  Skin: Skin is warm and dry.  Vitals reviewed.         Assessment & Plan:

## 2016-03-06 NOTE — Assessment & Plan Note (Signed)
New.  Repeat labs to ensure this is normalizing.  Pt is asymptomatic at this time.  Will follow.

## 2016-03-06 NOTE — Progress Notes (Signed)
Pre visit review using our clinic review tool, if applicable. No additional management support is needed unless otherwise documented below in the visit note. 

## 2016-03-06 NOTE — Assessment & Plan Note (Signed)
Resolved.  Pt reports she has not had pain since she went to the ER.  She had normal Troponin and EKG in ER.  sxs are more consistent w/ severe reflux or esophageal spasm.  This may have been triggered by her recent high dose keflex as she stated this really upset her stomach.  She remains on Omeprazole and ER added Pepcid x2 weeks.  Will continue to follow.

## 2016-03-11 ENCOUNTER — Encounter: Payer: Self-pay | Admitting: Family Medicine

## 2016-05-04 ENCOUNTER — Other Ambulatory Visit: Payer: Self-pay | Admitting: Family Medicine

## 2016-05-06 NOTE — Telephone Encounter (Signed)
Medication filled to pharmacy as requested.   

## 2016-05-21 ENCOUNTER — Other Ambulatory Visit: Payer: Self-pay | Admitting: Family Medicine

## 2016-05-21 NOTE — Telephone Encounter (Signed)
Medication filled to pharmacy as requested.   

## 2016-05-29 ENCOUNTER — Other Ambulatory Visit: Payer: Self-pay | Admitting: Family Medicine

## 2016-05-29 NOTE — Telephone Encounter (Signed)
Medication filled to pharmacy as requested.   

## 2016-07-31 ENCOUNTER — Encounter: Payer: Self-pay | Admitting: Family Medicine

## 2016-07-31 ENCOUNTER — Ambulatory Visit (INDEPENDENT_AMBULATORY_CARE_PROVIDER_SITE_OTHER): Payer: Medicare Other | Admitting: Family Medicine

## 2016-07-31 VITALS — BP 122/70 | HR 61 | Temp 98.0°F | Resp 16 | Ht 66.0 in | Wt 161.4 lb

## 2016-07-31 DIAGNOSIS — E039 Hypothyroidism, unspecified: Secondary | ICD-10-CM | POA: Diagnosis not present

## 2016-07-31 DIAGNOSIS — Z01810 Encounter for preprocedural cardiovascular examination: Secondary | ICD-10-CM | POA: Insufficient documentation

## 2016-07-31 DIAGNOSIS — Z23 Encounter for immunization: Secondary | ICD-10-CM | POA: Diagnosis not present

## 2016-07-31 DIAGNOSIS — M8580 Other specified disorders of bone density and structure, unspecified site: Secondary | ICD-10-CM | POA: Diagnosis not present

## 2016-07-31 DIAGNOSIS — L989 Disorder of the skin and subcutaneous tissue, unspecified: Secondary | ICD-10-CM

## 2016-07-31 DIAGNOSIS — Z0000 Encounter for general adult medical examination without abnormal findings: Secondary | ICD-10-CM | POA: Diagnosis not present

## 2016-07-31 DIAGNOSIS — I10 Essential (primary) hypertension: Secondary | ICD-10-CM

## 2016-07-31 LAB — LIPID PANEL
Cholesterol: 227 mg/dL — ABNORMAL HIGH (ref 0–200)
HDL: 42.6 mg/dL (ref >39.00)
NonHDL: 184.47
Total CHOL/HDL Ratio: 5
Triglycerides: 254 mg/dL — ABNORMAL HIGH (ref 0.0–149.0)
VLDL: 50.8 mg/dL — ABNORMAL HIGH (ref 0.0–40.0)

## 2016-07-31 LAB — BASIC METABOLIC PANEL
BUN: 23 mg/dL (ref 6–23)
CO2: 28 mEq/L (ref 19–32)
Calcium: 9.1 mg/dL (ref 8.4–10.5)
Chloride: 107 mEq/L (ref 96–112)
Creatinine, Ser: 1.01 mg/dL (ref 0.40–1.20)
GFR: 54.56 mL/min — ABNORMAL LOW (ref >60.00)
Glucose, Bld: 101 mg/dL — ABNORMAL HIGH (ref 70–99)
Potassium: 4.7 mEq/L (ref 3.5–5.1)
Sodium: 142 mEq/L (ref 135–145)

## 2016-07-31 LAB — CBC WITH DIFFERENTIAL/PLATELET
Basophils Absolute: 0 10*3/uL (ref 0.0–0.1)
Basophils Relative: 0.6 % (ref 0.0–3.0)
Eosinophils Absolute: 0.4 10*3/uL (ref 0.0–0.7)
Eosinophils Relative: 5.2 % — ABNORMAL HIGH (ref 0.0–5.0)
HCT: 39.8 % (ref 36.0–46.0)
Hemoglobin: 13.3 g/dL (ref 12.0–15.0)
Lymphocytes Relative: 30.2 % (ref 12.0–46.0)
Lymphs Abs: 2.2 10*3/uL (ref 0.7–4.0)
MCHC: 33.3 g/dL (ref 30.0–36.0)
MCV: 90.1 fl (ref 78.0–100.0)
Monocytes Absolute: 0.7 10*3/uL (ref 0.1–1.0)
Monocytes Relative: 9.6 % (ref 3.0–12.0)
Neutro Abs: 3.9 10*3/uL (ref 1.4–7.7)
Neutrophils Relative %: 54.4 % (ref 43.0–77.0)
Platelets: 290 10*3/uL (ref 150.0–400.0)
RBC: 4.41 Mil/uL (ref 3.87–5.11)
RDW: 15.2 % (ref 11.5–15.5)
WBC: 7.2 10*3/uL (ref 4.0–10.5)

## 2016-07-31 LAB — HEPATIC FUNCTION PANEL
ALT: 11 U/L (ref 0–35)
AST: 13 U/L (ref 0–37)
Albumin: 4.1 g/dL (ref 3.5–5.2)
Alkaline Phosphatase: 109 U/L (ref 39–117)
Bilirubin, Direct: 0.1 mg/dL (ref 0.0–0.3)
Total Bilirubin: 0.5 mg/dL (ref 0.2–1.2)
Total Protein: 6.8 g/dL (ref 6.0–8.3)

## 2016-07-31 LAB — TSH: TSH: 1.74 u[IU]/mL (ref 0.35–4.50)

## 2016-07-31 LAB — LDL CHOLESTEROL, DIRECT: Direct LDL: 121 mg/dL

## 2016-07-31 LAB — VITAMIN D 25 HYDROXY (VIT D DEFICIENCY, FRACTURES): VITD: 44.93 ng/mL (ref 30.00–100.00)

## 2016-07-31 NOTE — Assessment & Plan Note (Signed)
Chronic problem.  On Ca and Vit D daily.  Check Vit D level.  Pt declines repeat DEXA at this time

## 2016-07-31 NOTE — Assessment & Plan Note (Signed)
Chronic problem.  Currently asymptomatic.  Check labs.  Adjust meds prn  

## 2016-07-31 NOTE — Patient Instructions (Signed)
Follow up in 6 months to recheck BP We'll notify you of your lab results and make any changes if needed Keep up the good work!  You look great! You are up to date on colonoscopy, mammogram (due next month) You got your flu shot today so you are up to date Call with any questions or concerns Happy Fall!!!

## 2016-07-31 NOTE — Assessment & Plan Note (Signed)
Pt's PE unchanged from previous and WNL w/ exception of known L leg weakness and hyperpigmented skin lesion on L back.  UTD on mammo, colonoscopy.  Flu shot given today.  Written screening schedule updated and given to pt.  Check labs.  Anticipatory guidance provided.

## 2016-07-31 NOTE — Assessment & Plan Note (Signed)
Chronic problem.  Well controlled today.  Asymptomatic.  Check labs.  No anticipated med changes.  Will follow. 

## 2016-07-31 NOTE — Progress Notes (Signed)
Pre visit review using our clinic review tool, if applicable. No additional management support is needed unless otherwise documented below in the visit note. 

## 2016-07-31 NOTE — Progress Notes (Signed)
   Subjective:    Patient ID: [name], female    DOB: [birth year], 82 y.o.   MRN: [id number]  HPI Here today for CPE.  Risk Factors: HTN- chronic problem, on Amlodipine, Nadolol w/ good control Hypothyroid- on Levothyroxine daily Physical Activity:  Fall Risk: moderate risk- fell after tripping on something on the floor, weak L leg after back surgery Depression: denies current sxs Hearing: decreased to conversational tones ADL's: independent Cognitive: normal linear thought process, memory and attention intact Home Safety: safe at home Height, Weight, BMI, Visual Acuity: see vitals, vision corrected to 20/20 w/ glasses Counseling: UTD on colonoscopy, mammo (due Sept), due for DEXA- pt declines.  Will get flu today Care team reviewed and updated w/ pt Labs Ordered: See A&P Care Plan: See A&P    Review of Systems Patient reports no vision/ hearing changes, adenopathy,fever, weight change,  persistant/recurrent hoarseness , swallowing issues, chest pain, palpitations, edema, persistant/recurrent cough, hemoptysis, dyspnea (rest/exertional/paroxysmal nocturnal), gastrointestinal bleeding (melena, rectal bleeding), abdominal pain, significant heartburn, bowel changes, GU symptoms (dysuria, hematuria, incontinence), Gyn symptoms (abnormal  bleeding, pain),  syncope, memory loss, skin/hair/nail changes, abnormal bruising or bleeding, anxiety, or depression.   + weakness of L leg    Objective:   Physical Exam General Appearance:    Alert, cooperative, no distress, appears stated age  Head:    Normocephalic, without obvious abnormality, atraumatic  Eyes:    PERRL, conjunctiva/corneas clear, EOM's intact, fundi    benign, both eyes  Ears:    Normal TM's and external ear canals, both ears  Nose:   Nares normal, septum midline, mucosa normal, no drainage    or sinus tenderness  Throat:   Lips, mucosa, and tongue normal; teeth and gums normal  Neck:   Supple, symmetrical, trachea  midline, no adenopathy;    Thyroid: no enlargement/tenderness/nodules  Back:     Symmetric, no curvature, ROM normal, no CVA tenderness  Lungs:     Clear to auscultation bilaterally, respirations unlabored  Chest Wall:    No tenderness or deformity   Heart:    Regular rate and rhythm, S1 and S2 normal, no murmur, rub   or gallop  Breast Exam:    Deferred to mammo  Abdomen:     Soft, non-tender, bowel sounds active all four quadrants,    no masses, no organomegaly  Genitalia:    Deferred  Rectal:    Extremities:   Extremities normal, atraumatic, no cyanosis or edema  Pulses:   2+ and symmetric all extremities  Skin:   Skin color, texture, turgor normal, no rashes.  Hyperpigmented keratosis on L side of back  Lymph nodes:   Cervical, supraclavicular, and axillary nodes normal  Neurologic:   CNII-XII intact, normal strength, sensation and reflexes    throughout          Assessment & Plan:

## 2016-08-01 ENCOUNTER — Other Ambulatory Visit: Payer: Self-pay | Admitting: General Practice

## 2016-08-01 ENCOUNTER — Telehealth: Payer: Self-pay | Admitting: Family Medicine

## 2016-08-01 MED ORDER — FENOFIBRATE 160 MG PO TABS: 160.0000 mg | ORAL_TABLET | Freq: Every day | ORAL | 6 refills | Status: DC

## 2016-08-01 NOTE — Progress Notes (Signed)
Called pt and lmovm to return call.

## 2016-08-01 NOTE — Telephone Encounter (Signed)
Patient notified of PCP recommendations and is agreement and expresses an understanding.  

## 2016-08-01 NOTE — Telephone Encounter (Signed)
Pt returning your call for lab results and can be reached at home #.

## 2016-08-12 ENCOUNTER — Other Ambulatory Visit: Payer: Self-pay | Admitting: Family Medicine

## 2016-08-12 NOTE — Telephone Encounter (Signed)
Please advise on Rx, pt last vitamin D was 44.93

## 2016-09-02 ENCOUNTER — Other Ambulatory Visit: Payer: Self-pay | Admitting: Family Medicine

## 2016-09-02 DIAGNOSIS — Z1231 Encounter for screening mammogram for malignant neoplasm of breast: Secondary | ICD-10-CM

## 2016-09-04 ENCOUNTER — Other Ambulatory Visit: Payer: Self-pay | Admitting: Family Medicine

## 2016-09-09 ENCOUNTER — Other Ambulatory Visit: Payer: Self-pay | Admitting: Family Medicine

## 2016-10-02 ENCOUNTER — Encounter: Payer: Self-pay | Admitting: Family Medicine

## 2016-10-02 ENCOUNTER — Ambulatory Visit
Admission: RE | Admit: 2016-10-02 | Discharge: 2016-10-02 | Disposition: A | Discharge status: Home / Self Care | Payer: Medicare Other | Source: Ambulatory Visit | Attending: Family Medicine | Admitting: Family Medicine

## 2016-10-02 DIAGNOSIS — Z1231 Encounter for screening mammogram for malignant neoplasm of breast: Secondary | ICD-10-CM

## 2016-11-18 ENCOUNTER — Other Ambulatory Visit: Payer: Self-pay | Admitting: Family Medicine

## 2016-12-09 ENCOUNTER — Other Ambulatory Visit: Payer: Self-pay | Admitting: Family Medicine

## 2017-01-09 ENCOUNTER — Ambulatory Visit (INDEPENDENT_AMBULATORY_CARE_PROVIDER_SITE_OTHER): Payer: Medicare Other | Admitting: Family Medicine

## 2017-01-09 ENCOUNTER — Encounter: Payer: Self-pay | Admitting: Family Medicine

## 2017-01-09 DIAGNOSIS — R143 Flatulence: Secondary | ICD-10-CM

## 2017-01-09 MED ORDER — RIFAXIMIN 550 MG PO TABS: 550.0000 mg | ORAL_TABLET | Freq: Three times a day (TID) | ORAL | 0 refills | Status: DC

## 2017-01-09 NOTE — Progress Notes (Signed)
   Subjective:    Patient ID: [name], female    DOB: 1923-11-06, 83 y.o.   MRN: [id number]  HPI Gas and bloating- not new for pt but 'it's embarrassing to talk about'.  'it's impacting my whole life'.  Pt reports she will pass gas without warning.  Occurring regularly.  No bowel incontinence.  Denies abd cramping.  + bloating.  No pain.  Pt reports eating a lot of fiber and vegetables.  Has tried altering diet to avoid gassy food.  Has tried OTC Gas-X and Beano w/o relief.  No improvement w/ peppermint oil.  No diarrhea or constipation.  Taking Align   Review of Systems For ROS see HPI     Objective:   Physical Exam  Constitutional: She is oriented to person, place, and time. She appears well-developed and well-nourished. No distress.  Pulmonary/Chest: Effort normal and breath sounds normal. No respiratory distress. She has no wheezes. She has no rales.  Abdominal: Soft. Bowel sounds are normal. She exhibits no distension. There is no tenderness. There is no rebound and no guarding.  Neurological: She is alert and oriented to person, place, and time.  Skin: Skin is warm and dry.  Psychiatric: She has a normal mood and affect. Her behavior is normal. Thought content normal.  Vitals reviewed.         Assessment & Plan:  Excessive gas- this is extremely distressing to pt.  She has already tried a modified diet and OTC gas products as well as probiotics.  This may be due to bacterial overgrowth and we will start Rifaximin 3x/day.  If no improvement, will refer to GI for complete evaluation/treatment.  Pt expressed understanding and is in agreement w/ plan.

## 2017-01-09 NOTE — Progress Notes (Signed)
Pre visit review using our clinic review tool, if applicable. No additional management support is needed unless otherwise documented below in the visit note. 

## 2017-01-09 NOTE — Patient Instructions (Signed)
Follow up by phone or MyChart in 3 weeks and let me know how you are doing Start the Rifaximin 3x/day x10 days for possible bacterial overgrowth Eat a low FODMAP diet (try and limit the foods on the handout) Drink plenty of fluids (non-carbonated!) Call with any questions or concerns Hang in there!!!

## 2017-01-10 ENCOUNTER — Telehealth: Payer: Self-pay | Admitting: *Deleted

## 2017-01-10 ENCOUNTER — Telehealth: Payer: Self-pay | Admitting: Family Medicine

## 2017-01-10 NOTE — Telephone Encounter (Signed)
PA started for Xifaxan through Cover My Meds

## 2017-01-10 NOTE — Telephone Encounter (Signed)
[name] with [facility] calling to report PA for rifaximin (XIFAXAN) 550 MG TABS tablet is denied due to dx provided.

## 2017-01-10 NOTE — Telephone Encounter (Signed)
Insurance does not cover for current patient dx (excessive gas) - is there another DX or would you like to try another medication?

## 2017-01-12 ENCOUNTER — Encounter: Payer: Self-pay | Admitting: Family Medicine

## 2017-01-13 NOTE — Telephone Encounter (Signed)
We are treating presumed intestinal bacterial overgrowth (K63.89)  Thanks!

## 2017-01-14 NOTE — Telephone Encounter (Signed)
Paperwork has been filled out and faxed back to [employer]

## 2017-01-22 ENCOUNTER — Telehealth: Payer: Self-pay | Admitting: General Practice

## 2017-01-22 NOTE — Telephone Encounter (Signed)
Patient has been made aware - she was appreciative of the help in trying to get this covered.

## 2017-01-22 NOTE — Telephone Encounter (Signed)
Please advise, I received a refill request from the pharmacy that stated: pt was expecting to have an alternative sent in for xifaxin, due to insurance no longer covering.    Please advise? I see that per pt last mychart message this was being tried under another diagnosis code, any word on PA?

## 2017-01-22 NOTE — Telephone Encounter (Signed)
Please inform pt

## 2017-01-22 NOTE — Telephone Encounter (Signed)
Appeal Paperwork was done - I should have a response sometime today or tomorrow from them.

## 2017-01-22 NOTE — Telephone Encounter (Signed)
Per insurance PA for xifaxin was denied, called pt to inform and LMOVM for pt to return call.   Pt needs a referral to GI (dx. Excessive gas)

## 2017-01-22 NOTE — Telephone Encounter (Signed)
Waiting on appeal to prior auth.  Please let pt know

## 2017-01-22 NOTE — Telephone Encounter (Signed)
Spoke with pt, she will call me tomorrow to inform if she would prefer to see Dr. Liliane Channel office or [facility].

## 2017-01-22 NOTE — Telephone Encounter (Signed)
[name]- Have you heard anything on the PA since we changed the dx to bacterial overgrowth?

## 2017-01-29 ENCOUNTER — Ambulatory Visit (INDEPENDENT_AMBULATORY_CARE_PROVIDER_SITE_OTHER): Payer: Medicare Other | Admitting: Family Medicine

## 2017-01-29 ENCOUNTER — Encounter: Payer: Self-pay | Admitting: Family Medicine

## 2017-01-29 VITALS — BP 117/81 | HR 60 | Temp 98.1°F | Resp 17 | Ht 66.0 in | Wt 165.0 lb

## 2017-01-29 DIAGNOSIS — E781 Pure hyperglyceridemia: Secondary | ICD-10-CM

## 2017-01-29 DIAGNOSIS — I10 Essential (primary) hypertension: Secondary | ICD-10-CM | POA: Diagnosis not present

## 2017-01-29 DIAGNOSIS — R143 Flatulence: Secondary | ICD-10-CM | POA: Diagnosis not present

## 2017-01-29 LAB — CBC WITH DIFFERENTIAL/PLATELET
Basophils Absolute: 0.1 10*3/uL (ref 0.0–0.1)
Basophils Relative: 0.7 % (ref 0.0–3.0)
Eosinophils Absolute: 0.3 10*3/uL (ref 0.0–0.7)
Eosinophils Relative: 4.3 % (ref 0.0–5.0)
HCT: 38.3 % (ref 36.0–46.0)
Hemoglobin: 12.7 g/dL (ref 12.0–15.0)
Lymphocytes Relative: 26.5 % (ref 12.0–46.0)
Lymphs Abs: 2 10*3/uL (ref 0.7–4.0)
MCHC: 33 g/dL (ref 30.0–36.0)
MCV: 92.7 fl (ref 78.0–100.0)
Monocytes Absolute: 0.9 10*3/uL (ref 0.1–1.0)
Monocytes Relative: 11.6 % (ref 3.0–12.0)
Neutro Abs: 4.3 10*3/uL (ref 1.4–7.7)
Neutrophils Relative %: 56.9 % (ref 43.0–77.0)
Platelets: 369 10*3/uL (ref 150.0–400.0)
RBC: 4.14 Mil/uL (ref 3.87–5.11)
RDW: 13.2 % (ref 11.5–15.5)
WBC: 7.6 10*3/uL (ref 4.0–10.5)

## 2017-01-29 LAB — BASIC METABOLIC PANEL
BUN: 28 mg/dL — ABNORMAL HIGH (ref 6–23)
CO2: 28 mEq/L (ref 19–32)
Calcium: 9.6 mg/dL (ref 8.4–10.5)
Chloride: 108 mEq/L (ref 96–112)
Creatinine, Ser: 1.36 mg/dL — ABNORMAL HIGH (ref 0.40–1.20)
GFR: 38.66 mL/min — ABNORMAL LOW (ref >60.00)
Glucose, Bld: 92 mg/dL (ref 70–99)
Potassium: 4.7 mEq/L (ref 3.5–5.1)
Sodium: 142 mEq/L (ref 135–145)

## 2017-01-29 LAB — HEPATIC FUNCTION PANEL
ALT: 25 U/L (ref 0–35)
AST: 23 U/L (ref 0–37)
Albumin: 4 g/dL (ref 3.5–5.2)
Alkaline Phosphatase: 70 U/L (ref 39–117)
Bilirubin, Direct: 0.1 mg/dL (ref 0.0–0.3)
Total Bilirubin: 0.5 mg/dL (ref 0.2–1.2)
Total Protein: 6.4 g/dL (ref 6.0–8.3)

## 2017-01-29 LAB — LIPID PANEL
Cholesterol: 148 mg/dL (ref 0–200)
HDL: 41.7 mg/dL (ref >39.00)
LDL Cholesterol: 76 mg/dL (ref 0–99)
NonHDL: 106.31
Total CHOL/HDL Ratio: 4
Triglycerides: 153 mg/dL — ABNORMAL HIGH (ref 0.0–149.0)
VLDL: 30.6 mg/dL (ref 0.0–40.0)

## 2017-01-29 MED ORDER — DICYCLOMINE HCL 10 MG PO CAPS: 10.0000 mg | ORAL_CAPSULE | Freq: Three times a day (TID) | ORAL | 1 refills | Status: DC

## 2017-01-29 NOTE — Progress Notes (Signed)
Pre visit review using our clinic review tool, if applicable. No additional management support is needed unless otherwise documented below in the visit note. 

## 2017-01-29 NOTE — Assessment & Plan Note (Signed)
Pt was started on Fenofibrate last visit due to elevated triglycerides.  Asymptomatic at this time.  Due for repeat labs.  Adjust meds prn.

## 2017-01-29 NOTE — Progress Notes (Signed)
   Subjective:    Patient ID: [name], female    DOB: [birth year], 83 y.o.   MRN: [id number]  HPI HTN- chronic problem, on Amlodipine, Nadolol w/ good control.  No CP, SOB, HAs, visual changes, edema.  Elevated Triglycerides- chronic problem, on Fenofibrate daily.  Denies abd pain w/ exception of gas, N/V.  Exercising regularly  IBS- pt's xifaxin was not approved.  Pt is asking to try a low dose medication like bentyl to improve sxs.   Review of Systems For ROS see HPI     Objective:   Physical Exam  Constitutional: She is oriented to person, place, and time. She appears well-developed and well-nourished. No distress.  HENT:  Head: Normocephalic and atraumatic.  Eyes: Conjunctivae and EOM are normal. Pupils are equal, round, and reactive to light.  Neck: Normal range of motion. Neck supple. No thyromegaly present.  Cardiovascular: Normal rate, regular rhythm, normal heart sounds and intact distal pulses.   No murmur heard. Pulmonary/Chest: Effort normal and breath sounds normal. No respiratory distress.  Abdominal: Soft. She exhibits no distension. There is no tenderness.  Musculoskeletal: She exhibits no edema.  Lymphadenopathy:    She has no cervical adenopathy.  Neurological: She is alert and oriented to person, place, and time.  Skin: Skin is warm and dry.  Psychiatric: She has a normal mood and affect. Her behavior is normal.  Vitals reviewed.         Assessment & Plan:

## 2017-01-29 NOTE — Patient Instructions (Addendum)
Schedule your complete physical in September We'll notify you of your lab results and make any changes if needed Start the Dicyclomine (Bentyl) 3x daily before meals and prior to bed to help w/ IBS and gas/bloating If no improvement after 2 weeks on the Bentyl, let me know and we'll refer you to GI Keep up the good work on healthy diet and regular exercise- you look great! Call with any questions or concerns Happy Spring!!!

## 2017-01-29 NOTE — Assessment & Plan Note (Signed)
Ongoing issue.  Pt is asking to try Bentyl prior to going to GI.  Script provided.  If no improvement, will refer to GI.  Pt expressed understanding and is in agreement w/ plan.

## 2017-01-29 NOTE — Assessment & Plan Note (Signed)
Chronic problem.  Well controlled.  Asymptomatic.  Check labs.  No anticipated med changes.  Will follow. 

## 2017-01-30 ENCOUNTER — Other Ambulatory Visit: Payer: Self-pay | Admitting: Family Medicine

## 2017-01-30 DIAGNOSIS — R7989 Other specified abnormal findings of blood chemistry: Secondary | ICD-10-CM

## 2017-01-30 NOTE — Progress Notes (Signed)
Called pt and lmovm to return call.

## 2017-02-06 ENCOUNTER — Other Ambulatory Visit (INDEPENDENT_AMBULATORY_CARE_PROVIDER_SITE_OTHER): Payer: Medicare Other

## 2017-02-06 ENCOUNTER — Other Ambulatory Visit: Payer: Self-pay | Admitting: Family Medicine

## 2017-02-06 DIAGNOSIS — R7989 Other specified abnormal findings of blood chemistry: Secondary | ICD-10-CM | POA: Diagnosis not present

## 2017-02-06 LAB — BASIC METABOLIC PANEL
BUN: 30 mg/dL — ABNORMAL HIGH (ref 6–23)
CO2: 28 mEq/L (ref 19–32)
Calcium: 9.2 mg/dL (ref 8.4–10.5)
Chloride: 106 mEq/L (ref 96–112)
Creatinine, Ser: 1.44 mg/dL — ABNORMAL HIGH (ref 0.40–1.20)
GFR: 36.19 mL/min — ABNORMAL LOW (ref >60.00)
Glucose, Bld: 94 mg/dL (ref 70–99)
Potassium: 5.1 mEq/L (ref 3.5–5.1)
Sodium: 140 mEq/L (ref 135–145)

## 2017-02-10 ENCOUNTER — Telehealth: Payer: Self-pay | Admitting: Family Medicine

## 2017-02-10 ENCOUNTER — Other Ambulatory Visit: Payer: Self-pay | Admitting: Family Medicine

## 2017-02-10 NOTE — Telephone Encounter (Signed)
Spoke with patient and reviewed latest labs.   She was understanding and thanked me for letting her know why she needed to repeat labs.   She scheduled her lab visit for 2 weeks out.

## 2017-02-10 NOTE — Telephone Encounter (Signed)
Voice mail message received on phone at front desk on 02/06/17 at 9:16pm.  Patient states she needs someone to call her back to explain why she continues to have to come to office for follow up blood work.  She states she received a voicemail message on 02/06/17.  However, the message was not clear and she could not understand anything the person was saying.

## 2017-02-20 ENCOUNTER — Other Ambulatory Visit (INDEPENDENT_AMBULATORY_CARE_PROVIDER_SITE_OTHER): Payer: Medicare Other

## 2017-02-20 ENCOUNTER — Encounter: Payer: Self-pay | Admitting: General Practice

## 2017-02-20 DIAGNOSIS — R7989 Other specified abnormal findings of blood chemistry: Secondary | ICD-10-CM | POA: Diagnosis not present

## 2017-02-20 LAB — BASIC METABOLIC PANEL
BUN: 21 mg/dL (ref 6–23)
CO2: 29 mEq/L (ref 19–32)
Calcium: 9.6 mg/dL (ref 8.4–10.5)
Chloride: 106 mEq/L (ref 96–112)
Creatinine, Ser: 1.19 mg/dL (ref 0.40–1.20)
GFR: 45.1 mL/min — ABNORMAL LOW (ref >60.00)
Glucose, Bld: 84 mg/dL (ref 70–99)
Potassium: 4.6 mEq/L (ref 3.5–5.1)
Sodium: 140 mEq/L (ref 135–145)

## 2017-03-08 ENCOUNTER — Other Ambulatory Visit: Payer: Self-pay | Admitting: Family Medicine

## 2017-03-12 ENCOUNTER — Other Ambulatory Visit: Payer: Self-pay | Admitting: Family Medicine

## 2017-05-10 ENCOUNTER — Other Ambulatory Visit: Payer: Self-pay | Admitting: Family Medicine

## 2017-05-12 ENCOUNTER — Other Ambulatory Visit: Payer: Self-pay | Admitting: Dermatology

## 2017-06-02 ENCOUNTER — Other Ambulatory Visit: Payer: Self-pay | Admitting: Family Medicine

## 2017-06-16 DIAGNOSIS — C439 Malignant melanoma of skin, unspecified: Secondary | ICD-10-CM

## 2017-06-16 HISTORY — DX: Malignant melanoma of skin, unspecified: C43.9

## 2017-06-24 ENCOUNTER — Encounter: Payer: Self-pay | Admitting: Family Medicine

## 2017-06-25 ENCOUNTER — Encounter: Payer: Self-pay | Admitting: General Practice

## 2017-07-31 ENCOUNTER — Ambulatory Visit: Payer: Medicare Other

## 2017-08-01 ENCOUNTER — Other Ambulatory Visit: Payer: Self-pay | Admitting: Family Medicine

## 2017-08-06 NOTE — Progress Notes (Addendum)
Subjective:   [name] is a 83 y.o. female who presents for Medicare Annual (Subsequent) preventive examination.  Review of Systems:  No ROS.  Medicare Wellness Visit. Additional risk factors are reflected in the social history.  Cardiac Risk Factors include: advanced age (>51men, >4 women);hypertension   Sleep patterns: Sleeps 4-8 hours, up to void 3-4 times.  Home Safety/Smoke Alarms: Feels safe in home. Smoke alarms in place.  Living environment; residence and Firearm Safety: Lives alone in 1 story home, family live close.  Seat Belt Safety/Bike Helmet: Wears seat belt.   Female:   Pap-2014       Mammo-10/02/2016, negative. Will make appt in November Dexa scan-04/27/2014, osteopenia.  Declines further testing.    CCS-N/A      Objective:     Vitals: BP 120/68 (BP Location: Right Arm, Patient Position: Sitting, Cuff Size: Normal)   Pulse 63   Ht 5\' 6"  (1.676 m)   Wt 158 lb 12.8 oz (72 kg)   SpO2 97%   BMI 25.63 kg/m   Body mass index is 25.63 kg/m.   Tobacco History  Smoking Status  . Never Smoker  Smokeless Tobacco  . Never Used     Counseling given: Not Answered   Past Medical History:  Diagnosis Date  . DVT (deep venous thrombosis) ([place]) 09/2015   RLE  . GERD (gastroesophageal reflux disease)   . Hypertension   . Hypothyroidism   . 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 Peripheral vascular disease ([place])    peripheral neuropathy   Past Surgical History:  Procedure Laterality Date  . ABDOMINAL HYSTERECTOMY    . APPENDECTOMY    . BACK SURGERY    . BREAST SURGERY     left breast biopsy  . EYE SURGERY     cataract  . LIPOMA EXCISION Left 04/25/2015   Procedure: EXCISION OF LIPOMA LEFT ARM;  Surgeon: Donnie Mesa, MD;  Location: [facility];  Service: General;  Laterality: Left;  . LUMBAR LAMINECTOMY/DECOMPRESSION MICRODISCECTOMY  11/20/2011   Procedure: LUMBAR LAMINECTOMY/DECOMPRESSION  MICRODISCECTOMY;  Surgeon: Jeneen Rinks P [place];  Location: [facility];  Service: Orthopedics;  Laterality: N/A;  Decompressive Laminectomy L2 to the Sacrum (X-Ray)  . OTHER SURGICAL HISTORY     left wrist surgery - has plate in left wrist   . OTHER SURGICAL HISTORY     right knee surgery due to torn cartilage   . TONSILLECTOMY    . WRIST FRACTURE SURGERY Left    Family History  Problem Relation Age of Onset  . Cancer Mother        BREAST  . COPD Father   . Emphysema Father   . Cancer Daughter        breast   History  Sexual Activity  . Sexual activity: No    Outpatient Encounter Prescriptions as of 08/07/2017  Medication Sig  . Acetaminophen (TYLENOL PO) Take by mouth as needed.  Marland Kitchen amLODipine (NORVASC) 10 MG tablet TAKE 1 TABLET BY MOUTH EVERY DAY  . docusate sodium (COLACE) 100 MG capsule Take 1 capsule (100 mg total) by mouth every 12 (twelve) hours.  . fenofibrate 160 MG tablet Take 1 tablet (160 mg total) by mouth daily.  Marland Kitchen gabapentin (NEURONTIN) 300 MG capsule TAKE 2 CAPSULES BY MOUTH AT BEDTIME  . levothyroxine (SYNTHROID, LEVOTHROID) 50 MCG tablet TAKE 1 TABLET BY MOUTH EVERY DAY  . nadolol (CORGARD) 80 MG tablet TAKE 1 TABLET BY  MOUTH EVERY DAY  . omeprazole (PRILOSEC) 20 MG capsule TAKE 1 CAPSULE DAILY  . dicyclomine (BENTYL) 10 MG capsule TAKE 1 CAPSULE (10 MG TOTAL) BY MOUTH 4 (FOUR) TIMES DAILY - BEFORE MEALS AND AT BEDTIME. (Patient not taking: Reported on 08/07/2017)  . rifaximin (XIFAXAN) 550 MG TABS tablet Take 1 tablet (550 mg total) by mouth 3 (three) times daily. (Patient not taking: Reported on 08/07/2017)   No facility-administered encounter medications on file as of 08/07/2017.     Activities of Daily Living In your present state of health, do you have any difficulty performing the following activities: 08/07/2017  Hearing? N  Vision? N  Difficulty concentrating or making decisions? N  Walking or climbing stairs? Y  Comment H/O back surgery causes left leg  pain/weakness  Dressing or bathing? N  Doing errands, shopping? N  Preparing Food and eating ? N  Using the Toilet? N  In the past six months, have you accidently leaked urine? N  Do you have problems with loss of bowel control? N  Managing your Medications? N  Managing your Finances? N  Housekeeping or managing your Housekeeping? N  Some recent data might be hidden    Patient Care Team: [name], MD as PCP - General (Family Medicine) Susa Day, MD as Consulting Physician (Orthopedic Surgery) Druscilla Brownie, MD as Consulting Physician (Dermatology)    Assessment:    Physical assessment deferred to PCP.  Exercise Activities and Dietary recommendations Current Exercise Habits: The patient does not participate in regular exercise at present [employer], cooking), Exercise limited by: orthopedic condition(s)   Diet (meal preparation, eat out, water intake, caffeinated beverages, dairy products, fruits and vegetables): Drinks sweet tea, little water.   Breakfast: toast, donut, coffee Lunch: sandwich, fruit Dinner: protein and vegetables.   Goals    . patient          Maintain current health by staying active.       Fall Risk Fall Risk  08/07/2017 07/31/2016 01/26/2016  Falls in the past year? No Yes No  Number falls in past yr: - 1 -  Injury with Fall? - No -  Risk for fall due to : - Other (Comment) -  Risk for fall due to: Comment - pt tripped -   Depression Screen PHQ 2/9 Scores 08/07/2017 01/09/2017 07/31/2016 01/26/2016  PHQ - 2 Score 0 0 0 0  PHQ- 9 Score - 0 - -     Cognitive Function       Ad8 score reviewed for issues:  Issues making decisions: no  Less interest in hobbies / activities: no  Repeats questions, stories (family complaining): no  Trouble using ordinary gadgets (microwave, computer, phone): no  Forgets the month or year: no  Mismanaging finances: no  Remembering appts:no  Daily problems with thinking and/or memory:  no Ad8 score is=0     Immunization History  Administered Date(s) Administered  . Influenza, High Dose Seasonal PF 08/07/2017  . Influenza,inj,Quad PF,6+ Mos 07/31/2016  . Influenza-Unspecified 10/12/2015  . Pneumococcal Conjugate-13 04/08/2014  . Pneumococcal Polysaccharide-23 11/11/2008  . Td 04/02/2013  . Zoster 04/02/2013   Declines Shingrix  Screening Tests Health Maintenance  Topic Date Due  . INFLUENZA VACCINE  06/11/2017  . TETANUS/TDAP  04/03/2023  . DEXA SCAN  Completed  . PNA vac Low Risk Adult  Completed      Plan:     Bring a copy of your living will and/or healthcare power of attorney to your  next office visit.  Continue doing brain stimulating activities (puzzles, reading, adult coloring books, staying active) to keep memory sharp.   I have personally reviewed and noted the following in the patient's chart:   . Medical and social history . Use of alcohol, tobacco or illicit drugs  . Current medications and supplements . Functional ability and status . Nutritional status . Physical activity . Advanced directives . List of other physicians . Hospitalizations, surgeries, and ER visits in previous 12 months . Vitals . Screenings to include cognitive, depression, and falls . Referrals and appointments  In addition, I have reviewed and discussed with patient certain preventive protocols, quality metrics, and best practice recommendations. A written personalized care plan for preventive services as well as general preventive health recommendations were provided to patient.     Gerilyn Nestle, RN  08/07/2017   PCP Notes: -Declines further DEXA scans -Declines Shingrix -F/U with PCP for CPE in 11/2017  Reviewed documentation and agree w/ above.  Annye Asa, MD

## 2017-08-07 ENCOUNTER — Ambulatory Visit (INDEPENDENT_AMBULATORY_CARE_PROVIDER_SITE_OTHER): Payer: Medicare Other

## 2017-08-07 DIAGNOSIS — Z23 Encounter for immunization: Secondary | ICD-10-CM

## 2017-08-07 DIAGNOSIS — Z0000 Encounter for general adult medical examination without abnormal findings: Secondary | ICD-10-CM

## 2017-08-07 NOTE — Patient Instructions (Addendum)
Bring a copy of your living will and/or healthcare power of attorney to your next office visit.  Continue doing brain stimulating activities (puzzles, reading, adult coloring books, staying active) to keep memory sharp.   Fall Prevention in the Home Falls can cause injuries. They can happen to people of all ages. There are many things you can do to make your home safe and to help prevent falls. What can I do on the outside of my home?  Regularly fix the edges of walkways and driveways and fix any cracks.  Remove anything that might make you trip as you walk through a door, such as a raised step or threshold.  Trim any bushes or trees on the path to your home.  Use bright outdoor lighting.  Clear any walking paths of anything that might make someone trip, such as rocks or tools.  Regularly check to see if handrails are loose or broken. Make sure that both sides of any steps have handrails.  Any raised decks and porches should have guardrails on the edges.  Have any leaves, snow, or ice cleared regularly.  Use sand or salt on walking paths during winter.  Clean up any spills in your garage right away. This includes oil or grease spills. What can I do in the bathroom?  Use night lights.  Install grab bars by the toilet and in the tub and shower. Do not use towel bars as grab bars.  Use non-skid mats or decals in the tub or shower.  If you need to sit down in the shower, use a plastic, non-slip stool.  Keep the floor dry. Clean up any water that spills on the floor as soon as it happens.  Remove soap buildup in the tub or shower regularly.  Attach bath mats securely with double-sided non-slip rug tape.  Do not have throw rugs and other things on the floor that can make you trip. What can I do in the bedroom?  Use night lights.  Make sure that you have a light by your bed that is easy to reach.  Do not use any sheets or blankets that are too big for your bed. They should  not hang down onto the floor.  Have a firm chair that has side arms. You can use this for support while you get dressed.  Do not have throw rugs and other things on the floor that can make you trip. What can I do in the kitchen?  Clean up any spills right away.  Avoid walking on wet floors.  Keep items that you use a lot in easy-to-reach places.  If you need to reach something above you, use a strong step stool that has a grab bar.  Keep electrical cords out of the way.  Do not use floor polish or wax that makes floors slippery. If you must use wax, use non-skid floor wax.  Do not have throw rugs and other things on the floor that can make you trip. What can I do with my stairs?  Do not leave any items on the stairs.  Make sure that there are handrails on both sides of the stairs and use them. Fix handrails that are broken or loose. Make sure that handrails are as long as the stairways.  Check any carpeting to make sure that it is firmly attached to the stairs. Fix any carpet that is loose or worn.  Avoid having throw rugs at the top or bottom of the stairs. If you  do have throw rugs, attach them to the floor with carpet tape.  Make sure that you have a light switch at the top of the stairs and the bottom of the stairs. If you do not have them, ask someone to add them for you. What else can I do to help prevent falls?  Wear shoes that: ? Do not have high heels. ? Have rubber bottoms. ? Are comfortable and fit you well. ? Are closed at the toe. Do not wear sandals.  If you use a stepladder: ? Make sure that it is fully opened. Do not climb a closed stepladder. ? Make sure that both sides of the stepladder are locked into place. ? Ask someone to hold it for you, if possible.  Clearly mark and make sure that you can see: ? Any grab bars or handrails. ? First and last steps. ? Where the edge of each step is.  Use tools that help you move around (mobility aids) if they are  needed. These include: ? Canes. ? Walkers. ? Scooters. ? Crutches.  Turn on the lights when you go into a dark area. Replace any light bulbs as soon as they burn out.  Set up your furniture so you have a clear path. Avoid moving your furniture around.  If any of your floors are uneven, fix them.  If there are any pets around you, be aware of where they are.  Review your medicines with your doctor. Some medicines can make you feel dizzy. This can increase your chance of falling. Ask your doctor what other things that you can do to help prevent falls. This information is not intended to replace advice given to you by your health care provider. Make sure you discuss any questions you have with your health care provider. Document Released: 08/24/2009 Document Revised: 04/04/2016 Document Reviewed: 12/02/2014 Elsevier Interactive Patient Education  2018 [place] Maintenance, Female Adopting a healthy lifestyle and getting preventive care can go a long way to promote health and wellness. Talk with your health care provider about what schedule of regular examinations is right for you. This is a good chance for you to check in with your provider about disease prevention and staying healthy. In between checkups, there are plenty of things you can do on your own. Experts have done a lot of research about which lifestyle changes and preventive measures are most likely to keep you healthy. Ask your health care provider for more information. Weight and diet Eat a healthy diet  Be sure to include plenty of vegetables, fruits, low-fat dairy products, and lean protein.  Do not eat a lot of foods high in solid fats, added sugars, or salt.  Get regular exercise. This is one of the most important things you can do for your health. ? Most adults should exercise for at least 150 minutes each week. The exercise should increase your heart rate and make you sweat (moderate-intensity  exercise). ? Most adults should also do strengthening exercises at least twice a week. This is in addition to the moderate-intensity exercise.  Maintain a healthy weight  Body mass index (BMI) is a measurement that can be used to identify possible weight problems. It estimates body fat based on height and weight. Your health care provider can help determine your BMI and help you achieve or maintain a healthy weight.  For females 45 years of age and older: ? A BMI below 18.5 is considered underweight. ? A BMI of 18.5  to 24.9 is normal. ? A BMI of 25 to 29.9 is considered overweight. ? A BMI of 30 and above is considered obese.  Watch levels of cholesterol and blood lipids  You should start having your blood tested for lipids and cholesterol at 81 years of age, then have this test every 5 years.  You may need to have your cholesterol levels checked more often if: ? Your lipid or cholesterol levels are high. ? You are older than 81 years of age. ? You are at high risk for heart disease.  Cancer screening Lung Cancer  Lung cancer screening is recommended for adults 37-49 years old who are at high risk for lung cancer because of a history of smoking.  A yearly low-dose CT scan of the lungs is recommended for people who: ? Currently smoke. ? Have quit within the past 15 years. ? Have at least a 30-pack-year history of smoking. A pack year is smoking an average of one pack of cigarettes a day for 1 year.  Yearly screening should continue until it has been 15 years since you quit.  Yearly screening should stop if you develop a health problem that would prevent you from having lung cancer treatment.  Breast Cancer  Practice breast self-awareness. This means understanding how your breasts normally appear and feel.  It also means doing regular breast self-exams. Let your health care provider know about any changes, no matter how small.  If you are in your 20s or 30s, you should have a  clinical breast exam (CBE) by a health care provider every 1-3 years as part of a regular health exam.  If you are 77 or older, have a CBE every year. Also consider having a breast X-ray (mammogram) every year.  If you have a family history of breast cancer, talk to your health care provider about genetic screening.  If you are at high risk for breast cancer, talk to your health care provider about having an MRI and a mammogram every year.  Breast cancer gene (BRCA) assessment is recommended for women who have family members with BRCA-related cancers. BRCA-related cancers include: ? Breast. ? Ovarian. ? Tubal. ? Peritoneal cancers.  Results of the assessment will determine the need for genetic counseling and BRCA1 and BRCA2 testing.  Cervical Cancer Your health care provider may recommend that you be screened regularly for cancer of the pelvic organs (ovaries, uterus, and vagina). This screening involves a pelvic examination, including checking for microscopic changes to the surface of your cervix (Pap test). You may be encouraged to have this screening done every 3 years, beginning at age 63.  For women ages 34-65, health care providers may recommend pelvic exams and Pap testing every 3 years, or they may recommend the Pap and pelvic exam, combined with testing for human papilloma virus (HPV), every 5 years. Some types of HPV increase your risk of cervical cancer. Testing for HPV may also be done on women of any age with unclear Pap test results.  Other health care providers may not recommend any screening for nonpregnant women who are considered low risk for pelvic cancer and who do not have symptoms. Ask your health care provider if a screening pelvic exam is right for you.  If you have had past treatment for cervical cancer or a condition that could lead to cancer, you need Pap tests and screening for cancer for at least 20 years after your treatment. If Pap tests have been discontinued,  your risk  factors (such as having a new sexual partner) need to be reassessed to determine if screening should resume. Some women have medical problems that increase the chance of getting cervical cancer. In these cases, your health care provider may recommend more frequent screening and Pap tests.  Colorectal Cancer  This type of cancer can be detected and often prevented.  Routine colorectal cancer screening usually begins at 81 years of age and continues through 81 years of age.  Your health care provider may recommend screening at an earlier age if you have risk factors for colon cancer.  Your health care provider may also recommend using home test kits to check for hidden blood in the stool.  A small camera at the end of a tube can be used to examine your colon directly (sigmoidoscopy or colonoscopy). This is done to check for the earliest forms of colorectal cancer.  Routine screening usually begins at age 23.  Direct examination of the colon should be repeated every 5-10 years through 80 years of age. However, you may need to be screened more often if early forms of precancerous polyps or small growths are found.  Skin Cancer  Check your skin from head to toe regularly.  Tell your health care provider about any new moles or changes in moles, especially if there is a change in a mole's shape or color.  Also tell your health care provider if you have a mole that is larger than the size of a pencil eraser.  Always use sunscreen. Apply sunscreen liberally and repeatedly throughout the day.  Protect yourself by wearing long sleeves, pants, a wide-brimmed hat, and sunglasses whenever you are outside.  Heart disease, diabetes, and high blood pressure  High blood pressure causes heart disease and increases the risk of stroke. High blood pressure is more likely to develop in: ? People who have blood pressure in the high end of the normal range (130-139/85-89 mm Hg). ? People who are  overweight or obese. ? People who are African American.  If you are 62-39 years of age, have your blood pressure checked every 3-5 years. If you are 1 years of age or older, have your blood pressure checked every year. You should have your blood pressure measured twice-once when you are at a hospital or clinic, and once when you are not at a hospital or clinic. Record the average of the two measurements. To check your blood pressure when you are not at a hospital or clinic, you can use: ? An automated blood pressure machine at a pharmacy. ? A home blood pressure monitor.  If you are between 8 years and 54 years old, ask your health care provider if you should take aspirin to prevent strokes.  Have regular diabetes screenings. This involves taking a blood sample to check your fasting blood sugar level. ? If you are at a normal weight and have a low risk for diabetes, have this test once every three years after 81 years of age. ? If you are overweight and have a high risk for diabetes, consider being tested at a younger age or more often. Preventing infection Hepatitis B  If you have a higher risk for hepatitis B, you should be screened for this virus. You are considered at high risk for hepatitis B if: ? You were born in a country where hepatitis B is common. Ask your health care provider which countries are considered high risk. ? Your parents were born in a high-risk country, and  you have not been immunized against hepatitis B (hepatitis B vaccine). ? You have HIV or AIDS. ? You use needles to inject street drugs. ? You live with someone who has hepatitis B. ? You have had sex with someone who has hepatitis B. ? You get hemodialysis treatment. ? You take certain medicines for conditions, including cancer, organ transplantation, and autoimmune conditions.  Hepatitis C  Blood testing is recommended for: ? Everyone born from [birth year] through 1963. ? Anyone with known risk factors for  hepatitis C.  Sexually transmitted infections (STIs)  You should be screened for sexually transmitted infections (STIs) including gonorrhea and chlamydia if: ? You are sexually active and are younger than 81 years of age. ? You are older than 81 years of age and your health care provider tells you that you are at risk for this type of infection. ? Your sexual activity has changed since you were last screened and you are at an increased risk for chlamydia or gonorrhea. Ask your health care provider if you are at risk.  If you do not have HIV, but are at risk, it may be recommended that you take a prescription medicine daily to prevent HIV infection. This is called pre-exposure prophylaxis (PrEP). You are considered at risk if: ? You are sexually active and do not regularly use condoms or know the HIV status of your partner(s). ? You take drugs by injection. ? You are sexually active with a partner who has HIV.  Talk with your health care provider about whether you are at high risk of being infected with HIV. If you choose to begin PrEP, you should first be tested for HIV. You should then be tested every 3 months for as long as you are taking PrEP. Pregnancy  If you are premenopausal and you may become pregnant, ask your health care provider about preconception counseling.  If you may become pregnant, take 400 to 800 micrograms (mcg) of folic acid every day.  If you want to prevent pregnancy, talk to your health care provider about birth control (contraception). Osteoporosis and menopause  Osteoporosis is a disease in which the bones lose minerals and strength with aging. This can result in serious bone fractures. Your risk for osteoporosis can be identified using a bone density scan.  If you are 65 years of age or older, or if you are at risk for osteoporosis and fractures, ask your health care provider if you should be screened.  Ask your health care provider whether you should take a  calcium or vitamin D supplement to lower your risk for osteoporosis.  Menopause may have certain physical symptoms and risks.  Hormone replacement therapy may reduce some of these symptoms and risks. Talk to your health care provider about whether hormone replacement therapy is right for you. Follow these instructions at home:  Schedule regular health, dental, and eye exams.  Stay current with your immunizations.  Do not use any tobacco products including cigarettes, chewing tobacco, or electronic cigarettes.  If you are pregnant, do not drink alcohol.  If you are breastfeeding, limit how much and how often you drink alcohol.  Limit alcohol intake to no more than 1 drink per day for nonpregnant women. One drink equals 12 ounces of beer, 5 ounces of wine, or 1 ounces of hard liquor.  Do not use street drugs.  Do not share needles.  Ask your health care provider for help if you need support or information about quitting drugs.  Tell  your health care provider if you often feel depressed.  Tell your health care provider if you have ever been abused or do not feel safe at home. This information is not intended to replace advice given to you by your health care provider. Make sure you discuss any questions you have with your health care provider. Document Released: 05/13/2011 Document Revised: 04/04/2016 Document Reviewed: 08/01/2015 Elsevier Interactive Patient Education  [employer].

## 2017-11-13 ENCOUNTER — Other Ambulatory Visit: Payer: Self-pay

## 2017-11-13 ENCOUNTER — Ambulatory Visit (INDEPENDENT_AMBULATORY_CARE_PROVIDER_SITE_OTHER): Payer: Medicare Other | Admitting: Family Medicine

## 2017-11-13 ENCOUNTER — Encounter: Payer: Self-pay | Admitting: Family Medicine

## 2017-11-13 VITALS — BP 121/72 | HR 59 | Temp 98.2°F | Resp 16 | Ht 66.0 in | Wt 160.1 lb

## 2017-11-13 DIAGNOSIS — E039 Hypothyroidism, unspecified: Secondary | ICD-10-CM

## 2017-11-13 DIAGNOSIS — M159 Polyosteoarthritis, unspecified: Secondary | ICD-10-CM | POA: Diagnosis not present

## 2017-11-13 DIAGNOSIS — M8580 Other specified disorders of bone density and structure, unspecified site: Secondary | ICD-10-CM

## 2017-11-13 DIAGNOSIS — M1990 Unspecified osteoarthritis, unspecified site: Secondary | ICD-10-CM | POA: Insufficient documentation

## 2017-11-13 DIAGNOSIS — R2689 Other abnormalities of gait and mobility: Secondary | ICD-10-CM | POA: Diagnosis not present

## 2017-11-13 DIAGNOSIS — Z0001 Encounter for general adult medical examination with abnormal findings: Secondary | ICD-10-CM | POA: Diagnosis not present

## 2017-11-13 DIAGNOSIS — I10 Essential (primary) hypertension: Secondary | ICD-10-CM

## 2017-11-13 DIAGNOSIS — Z0000 Encounter for general adult medical examination without abnormal findings: Secondary | ICD-10-CM

## 2017-11-13 LAB — CBC WITH DIFFERENTIAL/PLATELET
Basophils Absolute: 0 10*3/uL (ref 0.0–0.1)
Basophils Relative: 0.6 % (ref 0.0–3.0)
Eosinophils Absolute: 0.3 10*3/uL (ref 0.0–0.7)
Eosinophils Relative: 5.2 % — ABNORMAL HIGH (ref 0.0–5.0)
HCT: 42.3 % (ref 36.0–46.0)
Hemoglobin: 13.7 g/dL (ref 12.0–15.0)
Lymphocytes Relative: 33 % (ref 12.0–46.0)
Lymphs Abs: 2 10*3/uL (ref 0.7–4.0)
MCHC: 32.3 g/dL (ref 30.0–36.0)
MCV: 93 fl (ref 78.0–100.0)
Monocytes Absolute: 0.7 10*3/uL (ref 0.1–1.0)
Monocytes Relative: 10.8 % (ref 3.0–12.0)
Neutro Abs: 3.1 10*3/uL (ref 1.4–7.7)
Neutrophils Relative %: 50.4 % (ref 43.0–77.0)
Platelets: 292 10*3/uL (ref 150.0–400.0)
RBC: 4.55 Mil/uL (ref 3.87–5.11)
RDW: 13.8 % (ref 11.5–15.5)
WBC: 6.1 10*3/uL (ref 4.0–10.5)

## 2017-11-13 LAB — LIPID PANEL
Cholesterol: 171 mg/dL (ref 0–200)
HDL: 39.9 mg/dL (ref >39.00)
LDL Cholesterol: 99 mg/dL (ref 0–99)
NonHDL: 131.19
Total CHOL/HDL Ratio: 4
Triglycerides: 163 mg/dL — ABNORMAL HIGH (ref 0.0–149.0)
VLDL: 32.6 mg/dL (ref 0.0–40.0)

## 2017-11-13 LAB — HEPATIC FUNCTION PANEL
ALT: 12 U/L (ref 0–35)
AST: 14 U/L (ref 0–37)
Albumin: 3.9 g/dL (ref 3.5–5.2)
Alkaline Phosphatase: 114 U/L (ref 39–117)
Bilirubin, Direct: 0.1 mg/dL (ref 0.0–0.3)
Total Bilirubin: 0.5 mg/dL (ref 0.2–1.2)
Total Protein: 6.2 g/dL (ref 6.0–8.3)

## 2017-11-13 LAB — BASIC METABOLIC PANEL
BUN: 18 mg/dL (ref 6–23)
CO2: 28 mEq/L (ref 19–32)
Calcium: 9.2 mg/dL (ref 8.4–10.5)
Chloride: 106 mEq/L (ref 96–112)
Creatinine, Ser: 0.87 mg/dL (ref 0.40–1.20)
GFR: 64.63 mL/min (ref >60.00)
Glucose, Bld: 98 mg/dL (ref 70–99)
Potassium: 4.4 mEq/L (ref 3.5–5.1)
Sodium: 141 mEq/L (ref 135–145)

## 2017-11-13 LAB — VITAMIN D 25 HYDROXY (VIT D DEFICIENCY, FRACTURES): VITD: 26.72 ng/mL — ABNORMAL LOW (ref 30.00–100.00)

## 2017-11-13 LAB — TSH: TSH: 1.74 u[IU]/mL (ref 0.35–4.50)

## 2017-11-13 MED ORDER — CELECOXIB 100 MG PO CAPS: 100.0000 mg | ORAL_CAPSULE | Freq: Two times a day (BID) | ORAL | 3 refills | Status: DC

## 2017-11-13 NOTE — Assessment & Plan Note (Signed)
Chronic problem.  Pt is in a lot of pain and has had to cut back on her activity level which has translated to SOB due to deconditioning.  It has also led to balance issues.  Start PT and daily Celebrex.  Pt expressed understanding and is in agreement w/ plan.

## 2017-11-13 NOTE — Patient Instructions (Signed)
Follow up in 6 months to recheck BP and schedule your Medicare Wellness Visit at the same time [facility] notify you of your lab results and make any changes if needed START the Celebrex twice daily for the arthritis We'll call you with your Physical Therapy appt to help w/ balance and deconditioning (I suspect that your shortness of breath is due to deconditioning) Call with any questions or concerns Happy New Year!

## 2017-11-13 NOTE — Assessment & Plan Note (Signed)
Chronic problem.  On Nadolol 80mg  and Amlodipine 10mg  daily w/ good control.  Asymptomatic w/ exception of SOB w/ exertion which seems due to deconditioning.  Check labs.  No anticipated med changes.  Will follow.

## 2017-11-13 NOTE — Assessment & Plan Note (Signed)
PE WNL.  UTD on colonoscopy (pt has aged out), mammo, DEXA, immunizations.  Check labs.  Anticipatory guidance provided.

## 2017-11-13 NOTE — Progress Notes (Signed)
   Subjective:    Patient ID: [name], female    DOB: [birth year], 84 y.o.   MRN: [id number]  HPI CPE- UTD on colonoscopy (no need for another), UTD on mammo, UTD on DEXA   Review of Systems Patient reports no vision/ hearing changes, adenopathy,fever, weight change,  persistant/recurrent hoarseness , swallowing issues, chest pain, palpitations, edema, persistant/recurrent cough, hemoptysis, gastrointestinal bleeding (melena, rectal bleeding), abdominal pain, significant heartburn, bowel changes, GU symptoms (dysuria, hematuria, incontinence), Gyn symptoms (abnormal  bleeding, pain),  syncope, focal weakness, memory loss, skin/hair/nail changes, abnormal bruising or bleeding, anxiety, or depression.   + SOB- occurring w/ exertion, resolves w/ sitting and resting.  Worse w/ stairs.  Started around Thanksgiving.  No leg swelling of discomfort (hx of DVT).  No change in medications.  Has decreased her activity level due to arthritis.  + chronic L leg numbness    Objective:   Physical Exam General Appearance:    Alert, cooperative, no distress, appears stated age  Head:    Normocephalic, without obvious abnormality, atraumatic  Eyes:    PERRL, conjunctiva/corneas clear, EOM's intact, fundi    benign, both eyes  Ears:    Normal TM's and external ear canals, both ears  Nose:   Nares normal, septum midline, mucosa normal, no drainage    or sinus tenderness  Throat:   Lips, mucosa, and tongue normal; teeth and gums normal  Neck:   Supple, symmetrical, trachea midline, no adenopathy;    Thyroid: no enlargement/tenderness/nodules  Back:     Symmetric, no curvature, ROM normal, no CVA tenderness  Lungs:     Clear to auscultation bilaterally, respirations unlabored  Chest Wall:    No tenderness or deformity   Heart:    Regular rate and rhythm, S1 and S2 normal, no murmur, rub   or gallop  Breast Exam:    Deferred  Abdomen:     Soft, non-tender, bowel sounds active all four quadrants,    no  masses, no organomegaly  Genitalia:    Deferred  Rectal:    Extremities:   Extremities normal, atraumatic, no cyanosis or edema  Pulses:   2+ and symmetric all extremities  Skin:   Skin color, texture, turgor normal, no rashes or lesions  Lymph nodes:   Cervical, supraclavicular, and axillary nodes normal  Neurologic:   CNII-XII intact, normal strength, sensation and reflexes    throughout          Assessment & Plan:

## 2017-11-13 NOTE — Assessment & Plan Note (Signed)
Chronic problem.  Check Vit D and replete prn. 

## 2017-11-13 NOTE — Assessment & Plan Note (Signed)
Chronic problem, on Levothyroxine daily.  Check labs.  Adjust meds prn

## 2017-11-14 ENCOUNTER — Other Ambulatory Visit: Payer: Self-pay | Admitting: General Practice

## 2017-11-14 ENCOUNTER — Other Ambulatory Visit: Payer: Self-pay | Admitting: Family Medicine

## 2017-11-14 MED ORDER — VITAMIN D (ERGOCALCIFEROL) 1.25 MG (50000 UNIT) PO CAPS: 50000.0000 [IU] | ORAL_CAPSULE | ORAL | 0 refills | Status: DC

## 2017-11-20 ENCOUNTER — Encounter: Payer: Self-pay | Admitting: Family Medicine

## 2017-11-26 ENCOUNTER — Other Ambulatory Visit: Payer: Self-pay | Admitting: General Practice

## 2017-11-26 MED ORDER — GABAPENTIN 300 MG PO CAPS: 600.0000 mg | ORAL_CAPSULE | Freq: Every day | ORAL | 1 refills | Status: DC

## 2017-12-02 ENCOUNTER — Other Ambulatory Visit: Payer: Self-pay | Admitting: Family Medicine

## 2017-12-02 DIAGNOSIS — Z1231 Encounter for screening mammogram for malignant neoplasm of breast: Secondary | ICD-10-CM

## 2017-12-22 ENCOUNTER — Ambulatory Visit: Payer: Medicare Other

## 2017-12-25 ENCOUNTER — Ambulatory Visit
Admission: RE | Admit: 2017-12-25 | Discharge: 2017-12-25 | Disposition: A | Discharge status: Home / Self Care | Payer: Medicare Other | Source: Ambulatory Visit | Attending: Family Medicine | Admitting: Family Medicine

## 2017-12-25 DIAGNOSIS — Z1231 Encounter for screening mammogram for malignant neoplasm of breast: Secondary | ICD-10-CM

## 2018-01-16 ENCOUNTER — Other Ambulatory Visit: Payer: Self-pay | Admitting: Family Medicine

## 2018-02-06 ENCOUNTER — Other Ambulatory Visit: Payer: Self-pay | Admitting: Family Medicine

## 2018-02-11 ENCOUNTER — Telehealth: Payer: Self-pay | Admitting: Family Medicine

## 2018-02-11 ENCOUNTER — Encounter: Payer: Self-pay | Admitting: *Deleted

## 2018-02-11 NOTE — Telephone Encounter (Signed)
Pt's son Lucianne Lei' called to report pt had fallen while shopping at [facility]. She is presently at center.  Someone present at store called son at work to let him know of the fall.  States she may need "a stitch or two in her chin."  Offered to call pt directly for additional information, triage; son unable to find pt's cell number. States his sister is on her way there. Directed him to notify sister to take  pt to UC. States will do as instructed.

## 2018-02-11 NOTE — Telephone Encounter (Signed)
Called and spoke with pt. She advised that she had tripped on some cobblestone and fell between her car and the one next to her. She ended up with 4 stitches in the chin. Pt was advised to follow up with either our office or UC in 1 week for suture removal. Pt was scheduled for 02/19/18 for suture removal.

## 2018-02-11 NOTE — Telephone Encounter (Signed)
This encounter was created in error - please disregard.

## 2018-02-11 NOTE — Telephone Encounter (Signed)
Noted.  Please call her later today and check on her

## 2018-02-16 ENCOUNTER — Other Ambulatory Visit: Payer: Self-pay | Admitting: Family Medicine

## 2018-02-16 ENCOUNTER — Other Ambulatory Visit: Payer: Self-pay | Admitting: Radiology

## 2018-02-16 ENCOUNTER — Ambulatory Visit: Payer: Medicare Other | Admitting: Family Medicine

## 2018-02-16 ENCOUNTER — Encounter: Payer: Self-pay | Admitting: Family Medicine

## 2018-02-16 ENCOUNTER — Ambulatory Visit (INDEPENDENT_AMBULATORY_CARE_PROVIDER_SITE_OTHER): Payer: Medicare Other

## 2018-02-16 ENCOUNTER — Other Ambulatory Visit: Payer: Self-pay

## 2018-02-16 VITALS — BP 140/84 | HR 72 | Temp 98.1°F | Resp 16 | Ht 66.0 in | Wt 162.5 lb

## 2018-02-16 DIAGNOSIS — R0781 Pleurodynia: Secondary | ICD-10-CM

## 2018-02-16 MED ORDER — HYDROCODONE-ACETAMINOPHEN 5-325 MG PO TABS
1.0000 | ORAL_TABLET | ORAL | 0 refills | Status: DC | PRN
Start: 2018-02-16 — End: 2019-06-08

## 2018-02-16 NOTE — Progress Notes (Signed)
   Subjective:    Patient ID: [name], female    DOB: [birth year], 84 y.o.   MRN: [id number]  HPI Rib pain- pt had bad fall in parking lot on 4/3.  She got stitches under her chin and initially had pain in her R elbow.  R ribs started hurting when she woke on Friday.  Pt feels she has to sit up very straight b/c if she relaxes or slouches, it is very painful.  Pain is 8/10.  Mild relief w/ alternating tylenol and ibuprofen.  R arm pain has improved.  Painful to take a deep breath.  Pain radiates around to back and down into abdomen.  Pt did poorly w/ codeine in the past but has taken Hydrocodone w/o difficulty after back surgery.   Review of Systems For ROS see HPI     Objective:   Physical Exam  Constitutional: She is oriented to person, place, and time. She appears well-developed and well-nourished.  Obviously uncomfortable  Pulmonary/Chest: No respiratory distress. She has no wheezes. She has no rales. She exhibits tenderness (point TTP over R rib posterior to mid axillary line (approximately rib 7) Also extreme TTP over lower false ribs in mid clavicular line).  Abdominal: Soft. Bowel sounds are normal. She exhibits no distension. There is tenderness (TTP over R false (lower) ribs). There is guarding (Voluntary guarding). There is no rebound.  Neurological: She is alert and oriented to person, place, and time.  Skin: Skin is warm and dry. No rash noted. No erythema.  Vitals reviewed.         Assessment & Plan:  R rib pain- new.  Pt fell in parking lot last Wednesday and woke Friday AM w/ severe rib pain (8/10).  Since then, she needs to sit upright b/c if she slouches/leans/relaxes she has severe pain.  TTP over mid rib (approximately 7th rib) posterior to mid axillary line w/ concern for rib fx.  Pt is also very TTP over anterior lower ribs.  Get xrays to assess.  Start Hydrocodone.  Incentive spirometer to prevent PNA.  Reviewed supportive care and red flags that should prompt  return.  Pt expressed understanding and is in agreement w/ plan.

## 2018-02-16 NOTE — Patient Instructions (Addendum)
Go to the [place] office ([place]) for your xray Use the Hydrocodone as needed for severe pain- don't combine this w/ tylenol (it has tylenol in it) Continue Ibuprofen as needed for pain Use the incentive spirometer- 5 deep breaths every hour while awake- to prevent pneumonia Alternate ice and heat- or whichever feels better Call with any questions or concerns Hang in there!!!

## 2018-02-19 ENCOUNTER — Encounter: Payer: Self-pay | Admitting: Family Medicine

## 2018-02-19 ENCOUNTER — Other Ambulatory Visit: Payer: Self-pay | Admitting: Family Medicine

## 2018-02-19 ENCOUNTER — Other Ambulatory Visit: Payer: Self-pay

## 2018-02-19 ENCOUNTER — Ambulatory Visit: Payer: Medicare Other | Admitting: Family Medicine

## 2018-02-19 VITALS — BP 130/81 | HR 54 | Temp 98.2°F | Resp 16 | Ht 66.0 in | Wt 163.0 lb

## 2018-02-19 DIAGNOSIS — S0181XD Laceration without foreign body of other part of head, subsequent encounter: Secondary | ICD-10-CM | POA: Diagnosis not present

## 2018-02-19 DIAGNOSIS — R110 Nausea: Secondary | ICD-10-CM

## 2018-02-19 MED ORDER — ONDANSETRON HCL 4 MG PO TABS: 4.0000 mg | ORAL_TABLET | Freq: Three times a day (TID) | ORAL | 0 refills | Status: DC | PRN

## 2018-02-19 NOTE — Patient Instructions (Signed)
Follow up as needed or as scheduled Decrease the pain medicine to 1/2 tab AS NEEDED Use the Zofran (Odansetron) as needed for nausea Call with any questions or concerns Happy Easter!!!

## 2018-02-19 NOTE — Progress Notes (Signed)
   Subjective:    Patient ID: [name], female    DOB: April 23, 1923, 84 y.o.   MRN: [id number]  HPI Suture removal- pt here today to remove sutures she had placed in her chin on 4/3 after her fall.  Pt reports ongoing nausea- feels this may be medication related as it started the day after she started her Hydrocodone.  Vomited yesterday.   Review of Systems For ROS see HPI     Objective:   Physical Exam  Constitutional: She is oriented to person, place, and time. She appears well-developed and well-nourished. No distress.  HENT:  Chin laceration well approximated and well healed w/ 4 mattress sutures in place  Neurological: She is alert and oriented to person, place, and time.  Skin: Skin is warm and dry. No rash noted. No erythema.  Psychiatric: She has a normal mood and affect. Her behavior is normal. Thought content normal.  Vitals reviewed.         Assessment & Plan:  Chin laceration- well healed.  4 mattress sutures removed w/o difficulty.  Pt tolerated procedure w/o difficulty  Nausea- new.  Pt is likely having nausea from her hydrocodone.  She is taking her medication regularly rather than as needed.  Clarified that she should only take medication as needed for pain and she will now take a 1/2 tab.  Zofran provided to use PRN.  Pt expressed understanding and is in agreement w/ plan.

## 2018-03-17 ENCOUNTER — Other Ambulatory Visit: Payer: Self-pay | Admitting: Family Medicine

## 2018-05-04 ENCOUNTER — Other Ambulatory Visit: Payer: Self-pay | Admitting: Family Medicine

## 2018-05-11 ENCOUNTER — Encounter: Payer: Self-pay | Admitting: General Practice

## 2018-05-11 ENCOUNTER — Encounter: Payer: Self-pay | Admitting: Family Medicine

## 2018-05-11 ENCOUNTER — Other Ambulatory Visit: Payer: Self-pay

## 2018-05-11 ENCOUNTER — Ambulatory Visit: Payer: Medicare Other | Admitting: Family Medicine

## 2018-05-11 VITALS — BP 116/70 | HR 62 | Temp 97.8°F | Resp 15 | Ht 66.0 in | Wt 156.8 lb

## 2018-05-11 DIAGNOSIS — E781 Pure hyperglyceridemia: Secondary | ICD-10-CM | POA: Diagnosis not present

## 2018-05-11 DIAGNOSIS — I10 Essential (primary) hypertension: Secondary | ICD-10-CM | POA: Diagnosis not present

## 2018-05-11 DIAGNOSIS — E039 Hypothyroidism, unspecified: Secondary | ICD-10-CM

## 2018-05-11 LAB — LIPID PANEL
Cholesterol: 193 mg/dL (ref 0–200)
HDL: 37.7 mg/dL — ABNORMAL LOW (ref >39.00)
NonHDL: 155.64
Total CHOL/HDL Ratio: 5
Triglycerides: 290 mg/dL — ABNORMAL HIGH (ref 0.0–149.0)
VLDL: 58 mg/dL — ABNORMAL HIGH (ref 0.0–40.0)

## 2018-05-11 LAB — CBC WITH DIFFERENTIAL/PLATELET
Basophils Absolute: 0 10*3/uL (ref 0.0–0.1)
Basophils Relative: 0.5 % (ref 0.0–3.0)
Eosinophils Absolute: 0.3 10*3/uL (ref 0.0–0.7)
Eosinophils Relative: 5.2 % — ABNORMAL HIGH (ref 0.0–5.0)
HCT: 40.3 % (ref 36.0–46.0)
Hemoglobin: 13.4 g/dL (ref 12.0–15.0)
Lymphocytes Relative: 32.3 % (ref 12.0–46.0)
Lymphs Abs: 2.2 10*3/uL (ref 0.7–4.0)
MCHC: 33.1 g/dL (ref 30.0–36.0)
MCV: 90.9 fl (ref 78.0–100.0)
Monocytes Absolute: 0.7 10*3/uL (ref 0.1–1.0)
Monocytes Relative: 10.5 % (ref 3.0–12.0)
Neutro Abs: 3.4 10*3/uL (ref 1.4–7.7)
Neutrophils Relative %: 51.5 % (ref 43.0–77.0)
Platelets: 280 10*3/uL (ref 150.0–400.0)
RBC: 4.44 Mil/uL (ref 3.87–5.11)
RDW: 14.2 % (ref 11.5–15.5)
WBC: 6.7 10*3/uL (ref 4.0–10.5)

## 2018-05-11 LAB — BASIC METABOLIC PANEL
BUN: 22 mg/dL (ref 6–23)
CO2: 27 mEq/L (ref 19–32)
Calcium: 9.3 mg/dL (ref 8.4–10.5)
Chloride: 107 mEq/L (ref 96–112)
Creatinine, Ser: 0.93 mg/dL (ref 0.40–1.20)
GFR: 59.78 mL/min — ABNORMAL LOW (ref >60.00)
Glucose, Bld: 95 mg/dL (ref 70–99)
Potassium: 4.6 mEq/L (ref 3.5–5.1)
Sodium: 140 mEq/L (ref 135–145)

## 2018-05-11 LAB — HEPATIC FUNCTION PANEL
ALT: 10 U/L (ref 0–35)
AST: 12 U/L (ref 0–37)
Albumin: 4 g/dL (ref 3.5–5.2)
Alkaline Phosphatase: 120 U/L — ABNORMAL HIGH (ref 39–117)
Bilirubin, Direct: 0 mg/dL (ref 0.0–0.3)
Total Bilirubin: 0.4 mg/dL (ref 0.2–1.2)
Total Protein: 6.3 g/dL (ref 6.0–8.3)

## 2018-05-11 LAB — TSH: TSH: 0.82 u[IU]/mL (ref 0.35–4.50)

## 2018-05-11 LAB — LDL CHOLESTEROL, DIRECT: Direct LDL: 85 mg/dL

## 2018-05-11 NOTE — Progress Notes (Signed)
   Subjective:    Patient ID: [name], female    DOB: 12-02-1922, 84 y.o.   MRN: [id number]  HPI HTN- chronic problem, on Amlodipine 10mg  daily w/ good control.  No CP, SOB, HAs, visual changes, edema.  Hypothyroid- chronic problem, on Levothyroxine 83mcg daily.  Pt feels that energy level 'is not up to par'.  No changes to skin/hair/nails  Hypertriglyceridemia- chronic problem, on Fenofibrate 160mg  daily.  Pt has lost 7 lbs since last visit.  No abd pain, N/V.   Review of Systems For ROS see HPI     Objective:   Physical Exam  Constitutional: She is oriented to person, place, and time. She appears well-developed and well-nourished. No distress.  HENT:  Head: Normocephalic and atraumatic.  Eyes: Pupils are equal, round, and reactive to light. Conjunctivae and EOM are normal.  Neck: Normal range of motion. Neck supple. No thyromegaly present.  Cardiovascular: Normal rate, regular rhythm, normal heart sounds and intact distal pulses.  No murmur heard. Pulmonary/Chest: Effort normal and breath sounds normal. No respiratory distress.  Abdominal: Soft. She exhibits no distension. There is no tenderness.  Musculoskeletal: She exhibits no edema.  Lymphadenopathy:    She has no cervical adenopathy.  Neurological: She is alert and oriented to person, place, and time.  Skin: Skin is warm and dry.  Psychiatric: She has a normal mood and affect. Her behavior is normal.  Vitals reviewed.         Assessment & Plan:

## 2018-05-11 NOTE — Patient Instructions (Signed)
Schedule your complete physical in 6 months We'll notify you of your lab results and make any changes if needed Keep up the good work!  You look great! If you again have bleeding- let me know and we will refer to GI Call with any questions or concerns Have a great summer!!

## 2018-05-11 NOTE — Assessment & Plan Note (Signed)
Chronic problem.  Well controlled.  Asymptomatic.  Check labs.  No anticipated med changes.  Will follow. 

## 2018-05-11 NOTE — Assessment & Plan Note (Signed)
Chronic problem.  On Fenofibrate.  Check labs.  Adjust tx prn.

## 2018-05-11 NOTE — Assessment & Plan Note (Signed)
Chronic problem, on Levothyroxine 66mcg daily.  Some complaints of fatigue.  Check labs.  Adjust meds prn.  Will follow.

## 2018-05-12 ENCOUNTER — Other Ambulatory Visit: Payer: Self-pay | Admitting: Family Medicine

## 2018-05-14 ENCOUNTER — Encounter: Payer: Self-pay | Admitting: Family Medicine

## 2018-06-25 ENCOUNTER — Telehealth: Payer: Self-pay | Admitting: Family Medicine

## 2018-06-25 NOTE — Telephone Encounter (Signed)
Paperwork given to PCP for completion.  

## 2018-06-25 NOTE — Telephone Encounter (Signed)
Pt asked if Dr. Birdie Riddle would fill out another parking placard for her, placed in bin upfront w/charge sheet.

## 2018-06-26 NOTE — Telephone Encounter (Signed)
Form completed and placed in basket  

## 2018-07-01 ENCOUNTER — Other Ambulatory Visit: Payer: Self-pay | Admitting: Emergency Medicine

## 2018-07-01 MED ORDER — DICYCLOMINE HCL 10 MG PO CAPS: 10.0000 mg | ORAL_CAPSULE | Freq: Three times a day (TID) | ORAL | 3 refills | Status: DC

## 2018-07-01 NOTE — Telephone Encounter (Signed)
Patient is requesting this medication be called into the pharmacy.   Last OV:  05/11/2018

## 2018-08-08 ENCOUNTER — Other Ambulatory Visit: Payer: Self-pay | Admitting: Family Medicine

## 2018-08-11 NOTE — Progress Notes (Addendum)
Subjective:   [name] is a 84 y.o. female who presents for Medicare Annual (Subsequent) preventive examination.  Review of Systems:  No ROS.  Medicare Wellness Visit. Additional risk factors are reflected in the social history.  Cardiac Risk Factors include: advanced age (>29men, >24 women);dyslipidemia;hypertension   Sleep patterns: Sleeps well, up to void q 2 hours.  Home Safety/Smoke Alarms: Feels safe in home. Smoke alarms in place.  Living environment; residence and Firearm Safety: Lives alone in 1 story home, family live close.  Seat Belt Safety/Bike Helmet: Wears seat belt.   Female:   Pap-N/A       Mammo-12/25/2017, BI-RADS CATEGORY  2: Benign.      Dexa scan-04/27/2014, osteopenia.  Declines further testing.        CCS-N/A      Objective:     Vitals: BP 112/64 (BP Location: Left Arm, Patient Position: Sitting, Cuff Size: Normal)   Pulse (!) 58   Ht 5\' 6"  (1.676 m)   Wt 155 lb 6 oz (70.5 kg)   SpO2 98%   BMI 25.08 kg/m   Body mass index is 25.08 kg/m.  Advanced Directives 08/12/2018 08/07/2017 04/25/2015 04/24/2015 11/20/2011 11/18/2011  Does Patient Have a Medical Advance Directive? Yes Yes No;Yes No Patient has advance directive, copy not in chart Patient has advance directive, copy not in chart  Type of Advance Directive [place];Living will Healthcare Power of [place] in Chart? Yes No - copy requested - - - -  Would patient like information on creating a medical advance directive? - - No - patient declined information No - patient declined information - -  Pre-existing out of facility DNR order (yellow form or pink MOST form) - - - - No No    Tobacco Social History   Tobacco Use  Smoking Status Never Smoker  Smokeless Tobacco Never Used     Counseling given: Not Answered    Past Medical History:    Diagnosis Date  . DVT (deep venous thrombosis) ([place]) 09/2015   RLE  . GERD (gastroesophageal reflux disease)   . Hypertension   . Hypothyroidism   . 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 Peripheral vascular disease ([place])    peripheral neuropathy   Past Surgical History:  Procedure Laterality Date  . ABDOMINAL HYSTERECTOMY    . APPENDECTOMY    . BACK SURGERY    . BREAST SURGERY     left breast biopsy  . EYE SURGERY     cataract  . LIPOMA EXCISION Left 04/25/2015   Procedure: EXCISION OF LIPOMA LEFT ARM;  Surgeon: Donnie Mesa, MD;  Location: [facility];  Service: General;  Laterality: Left;  . LUMBAR LAMINECTOMY/DECOMPRESSION MICRODISCECTOMY  11/20/2011   Procedure: LUMBAR LAMINECTOMY/DECOMPRESSION MICRODISCECTOMY;  Surgeon: Jeneen Rinks P [place];  Location: [facility];  Service: Orthopedics;  Laterality: N/A;  Decompressive Laminectomy L2 to the Sacrum (X-Ray)  . OTHER SURGICAL HISTORY     left wrist surgery - has plate in left wrist   . OTHER SURGICAL HISTORY     right knee surgery due to torn cartilage   . TONSILLECTOMY    . WRIST FRACTURE SURGERY Left    Family History  Problem Relation Age of Onset  . Cancer Mother        BREAST  . COPD Father   .  Emphysema Father   . Cancer Daughter        breast   Social History   Socioeconomic History  . Marital status: Widowed    Spouse name: Not on file  . Number of children: Not on file  . Years of education: Not on file  . Highest education level: Not on file  Occupational History  . Not on file  Social Needs  . Financial resource strain: Not on file  . Food insecurity:    Worry: Not on file    Inability: Not on file  . Transportation needs:    Medical: Not on file    Non-medical: Not on file  Tobacco Use  . Smoking status: Never Smoker  . Smokeless tobacco: Never Used  Substance and Sexual Activity  . Alcohol use: No  . Drug use: No  . Sexual activity: Never  Lifestyle   . Physical activity:    Days per week: Not on file    Minutes per session: Not on file  . Stress: Not on file  Relationships  . Social connections:    Talks on phone: Not on file    Gets together: Not on file    Attends religious service: Not on file    Active member of club or organization: Not on file    Attends meetings of clubs or organizations: Not on file    Relationship status: Not on file  Other Topics Concern  . Not on file  Social History Narrative  . Not on file    Outpatient Encounter Medications as of 08/12/2018  Medication Sig  . Acetaminophen (TYLENOL PO) Take by mouth as needed.  Marland Kitchen amLODipine (NORVASC) 10 MG tablet TAKE 1 TABLET BY MOUTH EVERY DAY  . celecoxib (CELEBREX) 100 MG capsule TAKE 1 CAPSULE BY MOUTH TWICE A DAY  . dicyclomine (BENTYL) 10 MG capsule Take 1 capsule (10 mg total) by mouth 4 (four) times daily -  before meals and at bedtime.  . docusate sodium (COLACE) 100 MG capsule Take 1 capsule (100 mg total) by mouth every 12 (twelve) hours.  . fenofibrate 160 MG tablet TAKE 1 TABLET DAILY  . gabapentin (NEURONTIN) 300 MG capsule TAKE 2 CAPSULES BY MOUTH AT BEDTIME  . levothyroxine (SYNTHROID, LEVOTHROID) 50 MCG tablet TAKE 1 TABLET BY MOUTH EVERY DAY  . nadolol (CORGARD) 80 MG tablet TAKE 1 TABLET BY MOUTH EVERY DAY  . omeprazole (PRILOSEC) 20 MG capsule TAKE 1 CAPSULE BY MOUTH EVERY DAY  . HYDROcodone-acetaminophen (NORCO/VICODIN) 5-325 MG tablet Take 1 tablet by mouth every 4 (four) hours as needed for moderate pain. (Patient not taking: Reported on 02/19/2018)  . ondansetron (ZOFRAN) 4 MG tablet Take 1 tablet (4 mg total) by mouth every 8 (eight) hours as needed for nausea or vomiting. (Patient not taking: Reported on 05/11/2018)   No facility-administered encounter medications on file as of 08/12/2018.     Activities of Daily Living In your present state of health, do you have any difficulty performing the following activities: 08/12/2018 02/16/2018   Hearing? N N  Vision? N N  Difficulty concentrating or making decisions? N N  Walking or climbing stairs? Y N  Comment stairs issue with left leg -  Dressing or bathing? N N  Doing errands, shopping? N N  Preparing Food and eating ? N -  Using the Toilet? N -  In the past six months, have you accidently leaked urine? N -  Do you have problems with loss of  bowel control? N -  Managing your Medications? N -  Managing your Finances? N -  Housekeeping or managing your Housekeeping? N -  Some recent data might be hidden    Patient Care Team: [name], MD as PCP - General (Family Medicine) Susa Day, MD as Consulting Physician (Orthopedic Surgery) Druscilla Brownie, MD as Consulting Physician (Dermatology)    Assessment:   This is a routine wellness examination for [name].  Exercise Activities and Dietary recommendations Current Exercise Habits: The patient does not participate in regular exercise at present(Maintains housework), Exercise limited by: orthopedic condition(s)   Diet (meal preparation, eat out, water intake, caffeinated beverages, dairy products, fruits and vegetables): Drinks unsweet tea and coffee.   Breakfast: toast; donut; waffle; coffee Lunch: sandwich Dinner: protein and vegetables  Goals    . patient     Maintain current health by staying active.        Fall Risk Fall Risk  08/12/2018 02/16/2018 11/13/2017 08/07/2017 07/31/2016  Falls in the past year? Yes Yes No No Yes  Comment tripped in parking lot, broken rib - - - -  Number falls in past yr: 1 1 - - 1  Injury with Fall? Yes Yes - - No  Risk Factor Category  High Fall Risk - - - -  Risk for fall due to : - - - - Other (Comment)  Risk for fall due to: Comment - - - - pt tripped  Follow up Falls prevention discussed Falls prevention discussed - - -    Depression Screen PHQ 2/9 Scores 08/12/2018 02/16/2018 11/13/2017 08/07/2017  PHQ - 2 Score 0 0 0 0  PHQ- 9 Score - 0 0 -     Cognitive  Function MMSE - Mini Mental State Exam 08/12/2018  Orientation to time 5  Orientation to Place 5  Registration 3  Attention/ Calculation 5  Recall 3  Language- name 2 objects 2  Language- repeat 1  Language- follow 3 step command 3  Language- read & follow direction 1  Write a sentence 1  Copy design 1  Total score 30        Immunization History  Administered Date(s) Administered  . Influenza, High Dose Seasonal PF 08/07/2017, 08/12/2018  . Influenza,inj,Quad PF,6+ Mos 07/31/2016  . Influenza-Unspecified 10/12/2015  . Pneumococcal Conjugate-13 04/08/2014  . Pneumococcal Polysaccharide-23 11/11/2008  . Td 04/02/2013  . Zoster 04/02/2013     Screening Tests Health Maintenance  Topic Date Due  . INFLUENZA VACCINE  06/11/2018  . TETANUS/TDAP  04/03/2023  . DEXA SCAN  Completed  . PNA vac Low Risk Adult  Completed        Plan:     Call [facility]. [phone] for eye exam.   Continue doing brain stimulating activities (puzzles, reading, adult coloring books, staying active) to keep memory sharp.   I have personally reviewed and noted the following in the patient's chart:   . Medical and social history . Use of alcohol, tobacco or illicit drugs  . Current medications and supplements . Functional ability and status . Nutritional status . Physical activity . Advanced directives . List of other physicians . Hospitalizations, surgeries, and ER visits in previous 12 months . Vitals . Screenings to include cognitive, depression, and falls . Referrals and appointments  In addition, I have reviewed and discussed with patient certain preventive protocols, quality metrics, and best practice recommendations. A written personalized care plan for preventive services as well as general preventive health  recommendations were provided to patient.     Gerilyn Nestle, RN  08/12/2018   F/U with PCP 12/2018  Reviewed documentation provided by RN and agree w/  above.  Annye Asa, MD

## 2018-08-12 ENCOUNTER — Other Ambulatory Visit: Payer: Self-pay

## 2018-08-12 ENCOUNTER — Ambulatory Visit (INDEPENDENT_AMBULATORY_CARE_PROVIDER_SITE_OTHER): Payer: Medicare Other

## 2018-08-12 DIAGNOSIS — Z23 Encounter for immunization: Secondary | ICD-10-CM | POA: Diagnosis not present

## 2018-08-12 DIAGNOSIS — Z0000 Encounter for general adult medical examination without abnormal findings: Secondary | ICD-10-CM | POA: Diagnosis not present

## 2018-08-12 NOTE — Patient Instructions (Addendum)
Call [facility]. [phone] for eye exam.   Continue doing brain stimulating activities (puzzles, reading, adult coloring books, staying active) to keep memory sharp.   Health Maintenance, Female Adopting a healthy lifestyle and getting preventive care can go a long way to promote health and wellness. Talk with your health care provider about what schedule of regular examinations is right for you. This is a good chance for you to check in with your provider about disease prevention and staying healthy. In between checkups, there are plenty of things you can do on your own. Experts have done a lot of research about which lifestyle changes and preventive measures are most likely to keep you healthy. Ask your health care provider for more information. Weight and diet Eat a healthy diet  Be sure to include plenty of vegetables, fruits, low-fat dairy products, and lean protein.  Do not eat a lot of foods high in solid fats, added sugars, or salt.  Get regular exercise. This is one of the most important things you can do for your health. ? Most adults should exercise for at least 150 minutes each week. The exercise should increase your heart rate and make you sweat (moderate-intensity exercise). ? Most adults should also do strengthening exercises at least twice a week. This is in addition to the moderate-intensity exercise.  Maintain a healthy weight  Body mass index (BMI) is a measurement that can be used to identify possible weight problems. It estimates body fat based on height and weight. Your health care provider can help determine your BMI and help you achieve or maintain a healthy weight.  For females 35 years of age and older: ? A BMI below 18.5 is considered underweight. ? A BMI of 18.5 to 24.9 is normal. ? A BMI of 25 to 29.9 is considered overweight. ? A BMI of 30 and above is considered obese.  Watch levels of cholesterol and blood lipids  You should start having your  blood tested for lipids and cholesterol at 82 years of age, then have this test every 5 years.  You may need to have your cholesterol levels checked more often if: ? Your lipid or cholesterol levels are high. ? You are older than 82 years of age. ? You are at high risk for heart disease.  Cancer screening Lung Cancer  Lung cancer screening is recommended for adults 14-56 years old who are at high risk for lung cancer because of a history of smoking.  A yearly low-dose CT scan of the lungs is recommended for people who: ? Currently smoke. ? Have quit within the past 15 years. ? Have at least a 30-pack-year history of smoking. A pack year is smoking an average of one pack of cigarettes a day for 1 year.  Yearly screening should continue until it has been 15 years since you quit.  Yearly screening should stop if you develop a health problem that would prevent you from having lung cancer treatment.  Breast Cancer  Practice breast self-awareness. This means understanding how your breasts normally appear and feel.  It also means doing regular breast self-exams. Let your health care provider know about any changes, no matter how small.  If you are in your 20s or 30s, you should have a clinical breast exam (CBE) by a health care provider every 1-3 years as part of a regular health exam.  If you are 30 or older, have a CBE every year. Also consider having a breast X-ray (  every year.  If you have a family history of breast cancer, talk to your health care provider about genetic screening.  If you are at high risk for breast cancer, talk to your health care provider about having an MRI and a mammogram every year.  Breast cancer gene (BRCA) assessment is recommended for women who have family members with BRCA-related cancers. BRCA-related cancers include: ? Breast. ? Ovarian. ? Tubal. ? Peritoneal cancers.  Results of the assessment will determine the need for genetic  counseling and BRCA1 and BRCA2 testing.  Cervical Cancer Your health care provider may recommend that you be screened regularly for cancer of the pelvic organs (ovaries, uterus, and vagina). This screening involves a pelvic examination, including checking for microscopic changes to the surface of your cervix (Pap test). You may be encouraged to have this screening done every 3 years, beginning at age 21.  For women ages 30-65, health care providers may recommend pelvic exams and Pap testing every 3 years, or they may recommend the Pap and pelvic exam, combined with testing for human papilloma virus (HPV), every 5 years. Some types of HPV increase your risk of cervical cancer. Testing for HPV may also be done on women of any age with unclear Pap test results.  Other health care providers may not recommend any screening for nonpregnant women who are considered low risk for pelvic cancer and who do not have symptoms. Ask your health care provider if a screening pelvic exam is right for you.  If you have had past treatment for cervical cancer or a condition that could lead to cancer, you need Pap tests and screening for cancer for at least 20 years after your treatment. If Pap tests have been discontinued, your risk factors (such as having a new sexual partner) need to be reassessed to determine if screening should resume. Some women have medical problems that increase the chance of getting cervical cancer. In these cases, your health care provider may recommend more frequent screening and Pap tests.  Colorectal Cancer  This type of cancer can be detected and often prevented.  Routine colorectal cancer screening usually begins at 82 years of age and continues through 82 years of age.  Your health care provider may recommend screening at an earlier age if you have risk factors for colon cancer.  Your health care provider may also recommend using home test kits to check for hidden blood in the  stool.  A small camera at the end of a tube can be used to examine your colon directly (sigmoidoscopy or colonoscopy). This is done to check for the earliest forms of colorectal cancer.  Routine screening usually begins at age 50.  Direct examination of the colon should be repeated every 5-10 years through 82 years of age. However, you may need to be screened more often if early forms of precancerous polyps or small growths are found.  Skin Cancer  Check your skin from head to toe regularly.  Tell your health care provider about any new moles or changes in moles, especially if there is a change in a mole's shape or color.  Also tell your health care provider if you have a mole that is larger than the size of a pencil eraser.  Always use sunscreen. Apply sunscreen liberally and repeatedly throughout the day.  Protect yourself by wearing long sleeves, pants, a wide-brimmed hat, and sunglasses whenever you are outside.  Heart disease, diabetes, and high blood pressure  High blood pressure causes   heart disease and increases the risk of stroke. High blood pressure is more likely to develop in: ? People who have blood pressure in the high end of the normal range (130-139/85-89 mm Hg). ? People who are overweight or obese. ? People who are African American.  If you are 18-39 years of age, have your blood pressure checked every 3-5 years. If you are 40 years of age or older, have your blood pressure checked every year. You should have your blood pressure measured twice-once when you are at a hospital or clinic, and once when you are not at a hospital or clinic. Record the average of the two measurements. To check your blood pressure when you are not at a hospital or clinic, you can use: ? An automated blood pressure machine at a pharmacy. ? A home blood pressure monitor.  If you are between 55 years and 79 years old, ask your health care provider if you should take aspirin to prevent  strokes.  Have regular diabetes screenings. This involves taking a blood sample to check your fasting blood sugar level. ? If you are at a normal weight and have a low risk for diabetes, have this test once every three years after 82 years of age. ? If you are overweight and have a high risk for diabetes, consider being tested at a younger age or more often. Preventing infection Hepatitis B  If you have a higher risk for hepatitis B, you should be screened for this virus. You are considered at high risk for hepatitis B if: ? You were born in a country where hepatitis B is common. Ask your health care provider which countries are considered high risk. ? Your parents were born in a high-risk country, and you have not been immunized against hepatitis B (hepatitis B vaccine). ? You have HIV or AIDS. ? You use needles to inject street drugs. ? You live with someone who has hepatitis B. ? You have had sex with someone who has hepatitis B. ? You get hemodialysis treatment. ? You take certain medicines for conditions, including cancer, organ transplantation, and autoimmune conditions.  Hepatitis C  Blood testing is recommended for: ? Everyone born from 1945 through 1965. ? Anyone with known risk factors for hepatitis C.  Sexually transmitted infections (STIs)  You should be screened for sexually transmitted infections (STIs) including gonorrhea and chlamydia if: ? You are sexually active and are younger than 82 years of age. ? You are older than 82 years of age and your health care provider tells you that you are at risk for this type of infection. ? Your sexual activity has changed since you were last screened and you are at an increased risk for chlamydia or gonorrhea. Ask your health care provider if you are at risk.  If you do not have HIV, but are at risk, it may be recommended that you take a prescription medicine daily to prevent HIV infection. This is called pre-exposure prophylaxis  (PrEP). You are considered at risk if: ? You are sexually active and do not regularly use condoms or know the HIV status of your partner(s). ? You take drugs by injection. ? You are sexually active with a partner who has HIV.  Talk with your health care provider about whether you are at high risk of being infected with HIV. If you choose to begin PrEP, you should first be tested for HIV. You should then be tested every 3 months for as long as   as you are taking PrEP. Pregnancy  If you are premenopausal and you may become pregnant, ask your health care provider about preconception counseling.  If you may become pregnant, take 400 to 800 micrograms (mcg) of folic acid every day.  If you want to prevent pregnancy, talk to your health care provider about birth control (contraception). Osteoporosis and menopause  Osteoporosis is a disease in which the bones lose minerals and strength with aging. This can result in serious bone fractures. Your risk for osteoporosis can be identified using a bone density scan.  If you are 109 years of age or older, or if you are at risk for osteoporosis and fractures, ask your health care provider if you should be screened.  Ask your health care provider whether you should take a calcium or vitamin D supplement to lower your risk for osteoporosis.  Menopause may have certain physical symptoms and risks.  Hormone replacement therapy may reduce some of these symptoms and risks. Talk to your health care provider about whether hormone replacement therapy is right for you. Follow these instructions at home:  Schedule regular health, dental, and eye exams.  Stay current with your immunizations.  Do not use any tobacco products including cigarettes, chewing tobacco, or electronic cigarettes.  If you are pregnant, do not drink alcohol.  If you are breastfeeding, limit how much and how often you drink alcohol.  Limit alcohol intake to no more than 1 drink per day for  nonpregnant women. One drink equals 12 ounces of beer, 5 ounces of wine, or 1 ounces of hard liquor.  Do not use street drugs.  Do not share needles.  Ask your health care provider for help if you need support or information about quitting drugs.  Tell your health care provider if you often feel depressed.  Tell your health care provider if you have ever been abused or do not feel safe at home. This information is not intended to replace advice given to you by your health care provider. Make sure you discuss any questions you have with your health care provider. Document Released: 05/13/2011 Document Revised: 04/04/2016 Document Reviewed: 08/01/2015 Elsevier Interactive Patient Education  [employer].

## 2018-09-07 ENCOUNTER — Other Ambulatory Visit: Payer: Self-pay | Admitting: Family Medicine

## 2018-09-11 ENCOUNTER — Other Ambulatory Visit: Payer: Self-pay | Admitting: Family Medicine

## 2018-11-16 ENCOUNTER — Other Ambulatory Visit: Payer: Self-pay | Admitting: Family Medicine

## 2018-12-07 ENCOUNTER — Other Ambulatory Visit: Payer: Self-pay | Admitting: Family Medicine

## 2018-12-23 ENCOUNTER — Other Ambulatory Visit: Payer: Self-pay

## 2018-12-23 ENCOUNTER — Ambulatory Visit (INDEPENDENT_AMBULATORY_CARE_PROVIDER_SITE_OTHER): Payer: Medicare Other | Admitting: Family Medicine

## 2018-12-23 ENCOUNTER — Encounter: Payer: Self-pay | Admitting: Family Medicine

## 2018-12-23 VITALS — BP 110/72 | HR 56 | Temp 98.0°F | Resp 16 | Ht 66.0 in | Wt 155.5 lb

## 2018-12-23 DIAGNOSIS — E039 Hypothyroidism, unspecified: Secondary | ICD-10-CM

## 2018-12-23 DIAGNOSIS — Z0000 Encounter for general adult medical examination without abnormal findings: Secondary | ICD-10-CM | POA: Diagnosis not present

## 2018-12-23 DIAGNOSIS — M8580 Other specified disorders of bone density and structure, unspecified site: Secondary | ICD-10-CM

## 2018-12-23 DIAGNOSIS — I10 Essential (primary) hypertension: Secondary | ICD-10-CM | POA: Diagnosis not present

## 2018-12-23 LAB — CBC WITH DIFFERENTIAL/PLATELET
Basophils Absolute: 0 10*3/uL (ref 0.0–0.1)
Basophils Relative: 0.5 % (ref 0.0–3.0)
Eosinophils Absolute: 0.3 10*3/uL (ref 0.0–0.7)
Eosinophils Relative: 3.8 % (ref 0.0–5.0)
HCT: 40.9 % (ref 36.0–46.0)
Hemoglobin: 13.5 g/dL (ref 12.0–15.0)
Lymphocytes Relative: 26.4 % (ref 12.0–46.0)
Lymphs Abs: 2.2 10*3/uL (ref 0.7–4.0)
MCHC: 33.1 g/dL (ref 30.0–36.0)
MCV: 91.5 fl (ref 78.0–100.0)
Monocytes Absolute: 0.7 10*3/uL (ref 0.1–1.0)
Monocytes Relative: 8.9 % (ref 3.0–12.0)
Neutro Abs: 5.1 10*3/uL (ref 1.4–7.7)
Neutrophils Relative %: 60.4 % (ref 43.0–77.0)
Platelets: 267 10*3/uL (ref 150.0–400.0)
RBC: 4.47 Mil/uL (ref 3.87–5.11)
RDW: 13.5 % (ref 11.5–15.5)
WBC: 8.4 10*3/uL (ref 4.0–10.5)

## 2018-12-23 LAB — LDL CHOLESTEROL, DIRECT: Direct LDL: 111 mg/dL

## 2018-12-23 LAB — BASIC METABOLIC PANEL
BUN: 23 mg/dL (ref 6–23)
CO2: 26 mEq/L (ref 19–32)
Calcium: 9.5 mg/dL (ref 8.4–10.5)
Chloride: 105 mEq/L (ref 96–112)
Creatinine, Ser: 1.34 mg/dL — ABNORMAL HIGH (ref 0.40–1.20)
GFR: 36.85 mL/min — ABNORMAL LOW (ref >60.00)
Glucose, Bld: 97 mg/dL (ref 70–99)
Potassium: 4.4 mEq/L (ref 3.5–5.1)
Sodium: 139 mEq/L (ref 135–145)

## 2018-12-23 LAB — HEPATIC FUNCTION PANEL
ALT: 12 U/L (ref 0–35)
AST: 15 U/L (ref 0–37)
Albumin: 4.1 g/dL (ref 3.5–5.2)
Alkaline Phosphatase: 113 U/L (ref 39–117)
Bilirubin, Direct: 0.1 mg/dL (ref 0.0–0.3)
Total Bilirubin: 0.5 mg/dL (ref 0.2–1.2)
Total Protein: 6.5 g/dL (ref 6.0–8.3)

## 2018-12-23 LAB — LIPID PANEL
Cholesterol: 189 mg/dL (ref 0–200)
HDL: 38.6 mg/dL — ABNORMAL LOW (ref >39.00)
NonHDL: 150.35
Total CHOL/HDL Ratio: 5
Triglycerides: 237 mg/dL — ABNORMAL HIGH (ref 0.0–149.0)
VLDL: 47.4 mg/dL — ABNORMAL HIGH (ref 0.0–40.0)

## 2018-12-23 LAB — VITAMIN D 25 HYDROXY (VIT D DEFICIENCY, FRACTURES): VITD: 28.15 ng/mL — ABNORMAL LOW (ref 30.00–100.00)

## 2018-12-23 LAB — TSH: TSH: 1.48 u[IU]/mL (ref 0.35–4.50)

## 2018-12-23 NOTE — Assessment & Plan Note (Signed)
Chronic problem, currently asymptomatic.  Check labs.  Adjust meds prn  

## 2018-12-23 NOTE — Patient Instructions (Addendum)
Follow up in 6 months to recheck blood pressure We'll notify you of your lab results and make any changes if needed Keep up the good work!  You look fantastic!!! Call with any questions or concerns Happy Valentine's Day!!!

## 2018-12-23 NOTE — Assessment & Plan Note (Signed)
Chronic problem.  Check Vit D and replete prn. 

## 2018-12-23 NOTE — Assessment & Plan Note (Signed)
Pt looks remarkably well for her age and appears much younger than she is.  UTD on mammo, colonoscopy, immunizations.  Check labs.  Anticipatory guidance provided.

## 2018-12-23 NOTE — Progress Notes (Signed)
   Subjective:    Patient ID: [name], female    DOB: 1923-01-18, 85 y.o.   MRN: [id number]  HPI CPE- UTD on mammo, colonoscopy, immunizations.  Pt reports feeling good   Review of Systems Patient reports no vision/ hearing changes, adenopathy,fever, weight change,  persistant/recurrent hoarseness , swallowing issues, chest pain, palpitations, edema, persistant/recurrent cough, hemoptysis, dyspnea (rest/exertional/paroxysmal nocturnal), gastrointestinal bleeding (melena, rectal bleeding), abdominal pain, significant heartburn, bowel changes, GU symptoms (dysuria, hematuria, incontinence), Gyn symptoms (abnormal  bleeding, pain),  syncope, focal weakness, memory loss, numbness & tingling, skin/hair/nail changes, abnormal bruising or bleeding, anxiety, or depression.     Objective:   Physical Exam General Appearance:    Alert, cooperative, no distress, appears stated age  Head:    Normocephalic, without obvious abnormality, atraumatic  Eyes:    PERRL, conjunctiva/corneas clear, EOM's intact, fundi    benign, both eyes  Ears:    Normal TM's and external ear canals, both ears  Nose:   Nares normal, septum midline, mucosa normal, no drainage    or sinus tenderness  Throat:   Lips, mucosa, and tongue normal; teeth and gums normal  Neck:   Supple, symmetrical, trachea midline, no adenopathy;    Thyroid: no enlargement/tenderness/nodules  Back:     Symmetric, no curvature, ROM normal, no CVA tenderness  Lungs:     Clear to auscultation bilaterally, respirations unlabored  Chest Wall:    No tenderness or deformity   Heart:    Regular rate and rhythm, S1 and S2 normal, no murmur, rub   or gallop  Breast Exam:    Deferred to mammo  Abdomen:     Soft, non-tender, bowel sounds active all four quadrants,    no masses, no organomegaly  Genitalia:    Deferred  Rectal:    Extremities:   Extremities normal, atraumatic, no cyanosis or edema  Pulses:   2+ and symmetric all extremities  Skin:    Skin color, texture, turgor normal, no rashes or lesions  Lymph nodes:   Cervical, supraclavicular, and axillary nodes normal  Neurologic:   CNII-XII intact, normal strength, sensation and reflexes    throughout          Assessment & Plan:

## 2018-12-23 NOTE — Assessment & Plan Note (Signed)
Chronic problem.  Well controlled today.  Asymptomatic.  Check labs.  No anticipated med changes. 

## 2018-12-25 ENCOUNTER — Other Ambulatory Visit: Payer: Self-pay | Admitting: General Practice

## 2018-12-25 DIAGNOSIS — R7989 Other specified abnormal findings of blood chemistry: Secondary | ICD-10-CM

## 2018-12-25 MED ORDER — VITAMIN D (ERGOCALCIFEROL) 1.25 MG (50000 UNIT) PO CAPS: 50000.0000 [IU] | ORAL_CAPSULE | ORAL | 0 refills | Status: DC

## 2018-12-31 ENCOUNTER — Other Ambulatory Visit (INDEPENDENT_AMBULATORY_CARE_PROVIDER_SITE_OTHER): Payer: Medicare Other

## 2018-12-31 DIAGNOSIS — R7989 Other specified abnormal findings of blood chemistry: Secondary | ICD-10-CM

## 2018-12-31 LAB — BASIC METABOLIC PANEL
BUN: 20 mg/dL (ref 6–23)
CO2: 27 mEq/L (ref 19–32)
Calcium: 9.2 mg/dL (ref 8.4–10.5)
Chloride: 107 mEq/L (ref 96–112)
Creatinine, Ser: 1.02 mg/dL (ref 0.40–1.20)
GFR: 50.49 mL/min — ABNORMAL LOW (ref >60.00)
Glucose, Bld: 85 mg/dL (ref 70–99)
Potassium: 4.7 mEq/L (ref 3.5–5.1)
Sodium: 142 mEq/L (ref 135–145)

## 2019-01-06 ENCOUNTER — Other Ambulatory Visit: Payer: Self-pay | Admitting: Family Medicine

## 2019-01-06 NOTE — Telephone Encounter (Signed)
Is this a long term medication for her?

## 2019-01-12 ENCOUNTER — Encounter: Payer: Self-pay | Admitting: Family Medicine

## 2019-01-14 ENCOUNTER — Encounter: Payer: Self-pay | Admitting: Family Medicine

## 2019-02-14 ENCOUNTER — Other Ambulatory Visit: Payer: Self-pay | Admitting: Family Medicine

## 2019-03-03 ENCOUNTER — Other Ambulatory Visit: Payer: Self-pay | Admitting: Family Medicine

## 2019-03-12 ENCOUNTER — Other Ambulatory Visit: Payer: Self-pay | Admitting: Family Medicine

## 2019-03-15 ENCOUNTER — Other Ambulatory Visit: Payer: Self-pay | Admitting: Family Medicine

## 2019-05-13 ENCOUNTER — Other Ambulatory Visit: Payer: Self-pay | Admitting: Family Medicine

## 2019-06-07 ENCOUNTER — Encounter: Payer: Self-pay | Admitting: Family Medicine

## 2019-06-07 ENCOUNTER — Other Ambulatory Visit: Payer: Self-pay

## 2019-06-07 ENCOUNTER — Ambulatory Visit: Payer: Medicare Other | Admitting: Family Medicine

## 2019-06-07 VITALS — BP 120/80 | HR 70 | Temp 97.0°F | Resp 17 | Ht 66.0 in | Wt 155.1 lb

## 2019-06-07 DIAGNOSIS — R300 Dysuria: Secondary | ICD-10-CM | POA: Diagnosis not present

## 2019-06-07 DIAGNOSIS — R82998 Other abnormal findings in urine: Secondary | ICD-10-CM

## 2019-06-07 LAB — POCT URINALYSIS DIPSTICK
Bilirubin, UA: NEGATIVE
Blood, UA: NEGATIVE
Glucose, UA: NEGATIVE
Ketones, UA: NEGATIVE
Nitrite, UA: NEGATIVE
Protein, UA: NEGATIVE
Spec Grav, UA: 1.015 (ref 1.010–1.025)
Urobilinogen, UA: 0.2 E.U./dL
pH, UA: 6 (ref 5.0–8.0)

## 2019-06-07 MED ORDER — SULFAMETHOXAZOLE-TRIMETHOPRIM 800-160 MG PO TABS: 1.0000 | ORAL_TABLET | Freq: Two times a day (BID) | ORAL | 0 refills | Status: DC

## 2019-06-07 NOTE — Progress Notes (Signed)
   Subjective:    Patient ID: [name], female    DOB: [birth year], 85 y.o.   MRN: [id number]  HPI Dysuria- pt reports chills last night prior to bed.  'was up every hour' last night going to the bathroom.  'it was burning'.  No hematuria.  + LBP.  No suprapubic TTP.  + urgency.  + hesitancy.   Review of Systems For ROS see HPI     Objective:   Physical Exam Vitals signs reviewed.  Constitutional:      General: She is not in acute distress.    Appearance: Normal appearance. She is well-developed.  Abdominal:     General: There is no distension.     Palpations: Abdomen is soft.     Tenderness: There is abdominal tenderness (+ low back TTP but no suprapubic or CVA tenderness ).  Neurological:     General: No focal deficit present.     Mental Status: She is alert.  Psychiatric:        Mood and Affect: Mood normal.        Thought Content: Thought content normal.           Assessment & Plan:  Dysuria- new.  Started overnight.  Pt unable to give sample in office but she will bring urine back this afternoon.  Given frequency, urgency, dysuria will start empiric abx.  Reviewed supportive care and red flags that should prompt return.  Pt expressed understanding and is in agreement w/ plan.

## 2019-06-07 NOTE — Addendum Note (Signed)
Addended by: Doran Clay A on: 06/07/2019 01:10 PM   Modules accepted: Orders

## 2019-06-07 NOTE — Patient Instructions (Signed)
Follow up as needed or as scheduled Please bring a sample so we can send it for culture START the Trimethoprim Sulfa twice daily- w/ food- for 5 days Drink plenty of fluids Call with any questions or concerns Hang in there!

## 2019-06-08 NOTE — Addendum Note (Signed)
Addended by: Davis Gourd on: 06/08/2019 02:16 PM   Modules accepted: Orders

## 2019-06-10 LAB — URINE CULTURE
MICRO NUMBER:: 706739
SPECIMEN QUALITY:: ADEQUATE

## 2019-06-22 ENCOUNTER — Other Ambulatory Visit: Payer: Self-pay

## 2019-06-22 ENCOUNTER — Ambulatory Visit: Payer: Medicare Other | Admitting: Family Medicine

## 2019-06-22 ENCOUNTER — Encounter: Payer: Self-pay | Admitting: Family Medicine

## 2019-06-22 VITALS — BP 123/81 | HR 61 | Temp 97.9°F | Resp 17 | Ht 66.0 in | Wt 156.2 lb

## 2019-06-22 DIAGNOSIS — E781 Pure hyperglyceridemia: Secondary | ICD-10-CM

## 2019-06-22 DIAGNOSIS — I10 Essential (primary) hypertension: Secondary | ICD-10-CM | POA: Diagnosis not present

## 2019-06-22 DIAGNOSIS — E039 Hypothyroidism, unspecified: Secondary | ICD-10-CM | POA: Diagnosis not present

## 2019-06-22 LAB — BASIC METABOLIC PANEL
BUN: 19 mg/dL (ref 6–23)
CO2: 27 mEq/L (ref 19–32)
Calcium: 9.6 mg/dL (ref 8.4–10.5)
Chloride: 105 mEq/L (ref 96–112)
Creatinine, Ser: 1 mg/dL (ref 0.40–1.20)
GFR: 51.6 mL/min — ABNORMAL LOW (ref >60.00)
Glucose, Bld: 90 mg/dL (ref 70–99)
Potassium: 4.9 mEq/L (ref 3.5–5.1)
Sodium: 138 mEq/L (ref 135–145)

## 2019-06-22 LAB — LIPID PANEL
Cholesterol: 190 mg/dL (ref 0–200)
HDL: 42.6 mg/dL (ref >39.00)
LDL Cholesterol: 114 mg/dL — ABNORMAL HIGH (ref 0–99)
NonHDL: 147.74
Total CHOL/HDL Ratio: 4
Triglycerides: 169 mg/dL — ABNORMAL HIGH (ref 0.0–149.0)
VLDL: 33.8 mg/dL (ref 0.0–40.0)

## 2019-06-22 LAB — CBC WITH DIFFERENTIAL/PLATELET
Basophils Absolute: 0 10*3/uL (ref 0.0–0.1)
Basophils Relative: 0.4 % (ref 0.0–3.0)
Eosinophils Absolute: 0.4 10*3/uL (ref 0.0–0.7)
Eosinophils Relative: 5.1 % — ABNORMAL HIGH (ref 0.0–5.0)
HCT: 39 % (ref 36.0–46.0)
Hemoglobin: 12.8 g/dL (ref 12.0–15.0)
Lymphocytes Relative: 30 % (ref 12.0–46.0)
Lymphs Abs: 2.2 10*3/uL (ref 0.7–4.0)
MCHC: 32.9 g/dL (ref 30.0–36.0)
MCV: 91.6 fl (ref 78.0–100.0)
Monocytes Absolute: 0.7 10*3/uL (ref 0.1–1.0)
Monocytes Relative: 9.6 % (ref 3.0–12.0)
Neutro Abs: 4.1 10*3/uL (ref 1.4–7.7)
Neutrophils Relative %: 54.9 % (ref 43.0–77.0)
Platelets: 286 10*3/uL (ref 150.0–400.0)
RBC: 4.26 Mil/uL (ref 3.87–5.11)
RDW: 13.3 % (ref 11.5–15.5)
WBC: 7.4 10*3/uL (ref 4.0–10.5)

## 2019-06-22 LAB — TSH: TSH: 1.42 u[IU]/mL (ref 0.35–4.50)

## 2019-06-22 LAB — HEPATIC FUNCTION PANEL
ALT: 12 U/L (ref 0–35)
AST: 15 U/L (ref 0–37)
Albumin: 4.1 g/dL (ref 3.5–5.2)
Alkaline Phosphatase: 106 U/L (ref 39–117)
Bilirubin, Direct: 0.1 mg/dL (ref 0.0–0.3)
Total Bilirubin: 0.5 mg/dL (ref 0.2–1.2)
Total Protein: 6.6 g/dL (ref 6.0–8.3)

## 2019-06-22 NOTE — Assessment & Plan Note (Signed)
Chronic problem.  Currently asymptomatic.  Check labs.  Adjust meds prn  

## 2019-06-22 NOTE — Patient Instructions (Addendum)
Schedule your complete physical in 6 months We'll notify you of your lab results and make any changes if needed Keep up the good work!  You look great! Call with any questions or concerns Stay safe!!!

## 2019-06-22 NOTE — Progress Notes (Signed)
   Subjective:    Patient ID: [name], female    DOB: 1923-07-14, 85 y.o.   MRN: [id number]  HPI HTN- chronic problem, on Amlodipine 10mg  daily, Nadolol 80mg  w/ good control.  'I feel fine'.  No CP, SOB, HAs, visual changes, edema.  Hypertriglyceridemia- on Fenofibrate.  No abd pain, N/V.  Hypothyroid- on Levothyroxine 60mcg daily.  Denies excessive fatigue, changes to skin/hair/nails.   Review of Systems For ROS see HPI     Objective:   Physical Exam Vitals signs reviewed.  Constitutional:      General: She is not in acute distress.    Appearance: She is well-developed.  HENT:     Head: Normocephalic and atraumatic.  Eyes:     Conjunctiva/sclera: Conjunctivae normal.     Pupils: Pupils are equal, round, and reactive to light.  Neck:     Musculoskeletal: Normal range of motion and neck supple.     Thyroid: No thyromegaly.  Cardiovascular:     Rate and Rhythm: Normal rate and regular rhythm.     Heart sounds: Normal heart sounds. No murmur.  Pulmonary:     Effort: Pulmonary effort is normal. No respiratory distress.     Breath sounds: Normal breath sounds.  Abdominal:     General: There is no distension.     Palpations: Abdomen is soft.     Tenderness: There is no abdominal tenderness.  Lymphadenopathy:     Cervical: No cervical adenopathy.  Skin:    General: Skin is warm and dry.  Neurological:     Mental Status: She is alert and oriented to person, place, and time.  Psychiatric:        Behavior: Behavior normal.           Assessment & Plan:

## 2019-06-22 NOTE — Assessment & Plan Note (Signed)
Chronic problem.  Well controlled.  Asymptomatic.  Check labs.  No anticipated med changes.  Will follow. 

## 2019-06-22 NOTE — Assessment & Plan Note (Signed)
Chronic problem.  Tolerating Fenofibrate w/o difficulty.  Check labs.  Adjust meds prn  °

## 2019-07-07 ENCOUNTER — Encounter: Payer: Self-pay | Admitting: Family Medicine

## 2019-07-26 ENCOUNTER — Ambulatory Visit: Payer: Medicare Other | Admitting: Family Medicine

## 2019-08-11 ENCOUNTER — Other Ambulatory Visit: Payer: Self-pay | Admitting: Family Medicine

## 2019-08-11 NOTE — Telephone Encounter (Signed)
Dr. Tabori's patient 

## 2019-09-01 ENCOUNTER — Other Ambulatory Visit: Payer: Self-pay

## 2019-09-01 ENCOUNTER — Encounter: Payer: Self-pay | Admitting: Physician Assistant

## 2019-09-01 ENCOUNTER — Ambulatory Visit: Payer: Medicare Other | Admitting: Physician Assistant

## 2019-09-01 VITALS — BP 130/70 | HR 66 | Temp 98.5°F | Resp 16 | Ht 66.0 in | Wt 157.0 lb

## 2019-09-01 DIAGNOSIS — K219 Gastro-esophageal reflux disease without esophagitis: Secondary | ICD-10-CM | POA: Diagnosis not present

## 2019-09-01 MED ORDER — PANTOPRAZOLE SODIUM 40 MG PO TBEC: 40.0000 mg | DELAYED_RELEASE_TABLET | Freq: Every day | ORAL | 3 refills | Status: DC

## 2019-09-01 NOTE — Progress Notes (Signed)
Patient presents to clinic today c/o nausea x 1 month. She states the nausea is worse in the morning when she wakes up, improves gradually through the day. Feels as though she may vomit, no vomiting episodes. Mild epigastric discomfort. She has history of reflux managed with omeprazole daily and pepcid prn. States she notices the reflux at night when she lays down. Occasional constipation managed with stool softener. No recent illness, denies fever, cough, hemoptysis, diarrhea, melena.    Past Medical History:  Diagnosis Date  . DVT (deep venous thrombosis) ([place]) 09/2015   RLE  . GERD (gastroesophageal reflux disease)   . Hypertension   . Hypothyroidism   . 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 Peripheral vascular disease ([place])    peripheral neuropathy    Current Outpatient Medications on File Prior to Visit  Medication Sig Dispense Refill  . Acetaminophen (TYLENOL PO) Take by mouth as needed.    Marland Kitchen amLODipine (NORVASC) 10 MG tablet TAKE 1 TABLET BY MOUTH EVERY DAY 90 tablet 1  . celecoxib (CELEBREX) 100 MG capsule TAKE 1 CAPSULE BY MOUTH TWICE A DAY 60 capsule 3  . docusate sodium (COLACE) 100 MG capsule Take 1 capsule (100 mg total) by mouth every 12 (twelve) hours. 60 capsule 0  . fenofibrate 160 MG tablet TAKE 1 TABLET DAILY (Patient not taking: Reported on 06/08/2019) 90 tablet 2  . gabapentin (NEURONTIN) 300 MG capsule TAKE 2 CAPSULES BY MOUTH EVERY DAY AT BEDTIME 180 capsule 1  . levothyroxine (SYNTHROID) 50 MCG tablet TAKE 1 TABLET BY MOUTH EVERY DAY 90 tablet 1  . Multiple Vitamins-Minerals (PRESERVISION AREDS 2) CAPS Take by mouth.    . nadolol (CORGARD) 80 MG tablet TAKE 1 TABLET BY MOUTH EVERY DAY 90 tablet 1  . omeprazole (PRILOSEC) 20 MG capsule TAKE 1 CAPSULE BY MOUTH EVERY DAY 90 capsule 2  . ondansetron (ZOFRAN) 4 MG tablet Take 1 tablet (4 mg total) by mouth every 8 (eight) hours as needed for nausea or vomiting. 30 tablet 0   No  current facility-administered medications on file prior to visit.     Allergies  Allergen Reactions  . Codeine Other (See Comments)    Nightmares, imagining things  . Keflex [Cephalexin] Nausea Only    Pt ended up in ER w/ CP    Family History  Problem Relation Age of Onset  . Cancer Mother        BREAST  . COPD Father   . Emphysema Father   . Cancer Daughter        breast    Social History   Socioeconomic History  . Marital status: Widowed    Spouse name: Not on file  . Number of children: Not on file  . Years of education: Not on file  . Highest education level: Not on file  Occupational History  . Not on file  Social Needs  . Financial resource strain: Not on file  . Food insecurity    Worry: Not on file    Inability: Not on file  . Transportation needs    Medical: Not on file    Non-medical: Not on file  Tobacco Use  . Smoking status: Never Smoker  . Smokeless tobacco: Never Used  Substance and Sexual Activity  . Alcohol use: No  . Drug use: No  . Sexual activity: Never  Lifestyle  . Physical activity    Days per week: Not on file  Minutes per session: Not on file  . Stress: Not on file  Relationships  . Social Herbalist on phone: Not on file    Gets together: Not on file    Attends religious service: Not on file    Active member of club or organization: Not on file    Attends meetings of clubs or organizations: Not on file    Relationship status: Not on file  Other Topics Concern  . Not on file  Social History Narrative  . Not on file   Review of Systems - See HPI.  All other ROS are negative.  Wt 157 lb (71.2 kg)   BMI 25.34 kg/m   Physical Exam Vitals signs reviewed.  Constitutional:      Appearance: Normal appearance.  HENT:     Head: Normocephalic and atraumatic.  Neck:     Musculoskeletal: Neck supple.  Cardiovascular:     Rate and Rhythm: Normal rate and regular rhythm.     Pulses: Normal pulses.     Heart  sounds: Normal heart sounds.  Pulmonary:     Effort: Pulmonary effort is normal.     Breath sounds: Normal breath sounds.  Abdominal:     General: Bowel sounds are normal. There is no distension.     Palpations: Abdomen is soft. There is no mass.     Tenderness: There is abdominal tenderness (mild epigatric tenderness with deep palpation). There is no guarding or rebound.  Neurological:     Mental Status: She is alert.     Recent Results (from the past 2160 hour(s))  Urine Culture     Status: Abnormal   Collection Time: 06/07/19  1:10 PM   Specimen: Urine  Result Value Ref Range   MICRO NUMBER: [id number]    SPECIMEN QUALITY: Adequate    Sample Source NOT GIVEN    STATUS: FINAL    ISOLATE 1: Klebsiella pneumoniae (A)     Comment: Greater than 100,000 CFU/mL of Klebsiella pneumoniae      Susceptibility   Klebsiella pneumoniae - URINE CULTURE, REFLEX    AMOX/CLAVULANIC 4 Sensitive     AMPICILLIN >=32 Resistant     AMPICILLIN/SULBACTAM 16 Intermediate     CEFAZOLIN* <=4 Not Reportable      * For infections other than uncomplicated UTIcaused by E. coli, K. pneumoniae or P. mirabilis:Cefazolin is resistant if MIC > or = 8 mcg/mL.(Distinguishing susceptible versus intermediatefor isolates with MIC < or = 4 mcg/mL requiresadditional testing.)For uncomplicated UTI caused by E. coli,K. pneumoniae or P. mirabilis: Cefazolin issusceptible if MIC <32 mcg/mL and predictssusceptible to the oral agents cefaclor, cefdinir,cefpodoxime, cefprozil, cefuroxime, cephalexinand loracarbef.    CEFEPIME <=1 Sensitive     CEFTRIAXONE <=1 Sensitive     CIPROFLOXACIN <=0.25 Sensitive     LEVOFLOXACIN <=0.12 Sensitive     ERTAPENEM <=0.5 Sensitive     GENTAMICIN <=1 Sensitive     IMIPENEM <=0.25 Sensitive     NITROFURANTOIN 128 Resistant     TOBRAMYCIN <=1 Sensitive     TRIMETH/SULFA* <=20 Sensitive      * For infections other than uncomplicated UTIcaused by E. coli, K. pneumoniae or P. mirabilis:Cefazolin  is resistant if MIC > or = 8 mcg/mL.(Distinguishing susceptible versus intermediatefor isolates with MIC < or = 4 mcg/mL requiresadditional testing.)For uncomplicated UTI caused by E. coli,K. pneumoniae or P. mirabilis: Cefazolin issusceptible if MIC <32 mcg/mL and predictssusceptible to the oral agents cefaclor, cefdinir,cefpodoxime, cefprozil, cefuroxime, cephalexinand loracarbef.Legend:S = Susceptible  I = IntermediateR = Resistant  NS = Not susceptible* = Not tested  NR = Not reported**NN = See antimicrobic comments  POCT Urinalysis Dipstick     Status: Abnormal   Collection Time: 06/07/19  1:11 PM  Result Value Ref Range   Color, UA yellow    Clarity, UA cloudy    Glucose, UA Negative Negative   Bilirubin, UA negative    Ketones, UA negative    Spec Grav, UA 1.015 1.010 - 1.025   Blood, UA negative    pH, UA 6.0 5.0 - 8.0   Protein, UA Negative Negative   Urobilinogen, UA 0.2 0.2 or 1.0 E.U./dL   Nitrite, UA negative    Leukocytes, UA Large (3+) (A) Negative   Appearance     Odor    Lipid panel     Status: Abnormal   Collection Time: 06/22/19 11:12 AM  Result Value Ref Range   Cholesterol 190 0 - 200 mg/dL    Comment: ATP III Classification       Desirable:  < 200 mg/dL               Borderline High:  200 - 239 mg/dL          High:  > = 240 mg/dL   Triglycerides 169.0 (H) 0.0 - 149.0 mg/dL    Comment: Normal:  <150 mg/dLBorderline High:  150 - 199 mg/dL   HDL 42.60 >39.00 mg/dL   VLDL 33.8 0.0 - 40.0 mg/dL   LDL Cholesterol 114 (H) 0 - 99 mg/dL   Total CHOL/HDL Ratio 4     Comment:                Men          Women1/2 Average Risk     3.4          3.3Average Risk          5.0          4.42X Average Risk          9.6          7.13X Average Risk          15.0          11.0                       NonHDL 147.74     Comment: NOTE:  Non-HDL goal should be 30 mg/dL higher than patient's LDL goal (i.e. LDL goal of < 70 mg/dL, would have non-HDL goal of < 100 mg/dL)  Basic metabolic panel      Status: Abnormal   Collection Time: 06/22/19 11:12 AM  Result Value Ref Range   Sodium 138 135 - 145 mEq/L   Potassium 4.9 3.5 - 5.1 mEq/L   Chloride 105 96 - 112 mEq/L   CO2 27 19 - 32 mEq/L   Glucose, Bld 90 70 - 99 mg/dL   BUN 19 6 - 23 mg/dL   Creatinine, Ser 1.00 0.40 - 1.20 mg/dL   Calcium 9.6 8.4 - 10.5 mg/dL   GFR 51.60 (L) >60.00 mL/min  TSH     Status: None   Collection Time: 06/22/19 11:12 AM  Result Value Ref Range   TSH 1.42 0.35 - 4.50 uIU/mL  Hepatic function panel     Status: None   Collection Time: 06/22/19 11:12 AM  Result Value Ref Range   Total Bilirubin 0.5 0.2 - 1.2 mg/dL  Bilirubin, Direct 0.1 0.0 - 0.3 mg/dL   Alkaline Phosphatase 106 39 - 117 U/L   AST 15 0 - 37 U/L   ALT 12 0 - 35 U/L   Total Protein 6.6 6.0 - 8.3 g/dL   Albumin 4.1 3.5 - 5.2 g/dL  CBC with Differential/Platelet     Status: Abnormal   Collection Time: 06/22/19 11:12 AM  Result Value Ref Range   WBC 7.4 4.0 - 10.5 K/uL   RBC 4.26 3.87 - 5.11 Mil/uL   Hemoglobin 12.8 12.0 - 15.0 g/dL   HCT 39.0 36.0 - 46.0 %   MCV 91.6 78.0 - 100.0 fl   MCHC 32.9 30.0 - 36.0 g/dL   RDW 13.3 11.5 - 15.5 %   Platelets 286.0 150.0 - 400.0 K/uL   Neutrophils Relative % 54.9 43.0 - 77.0 %   Lymphocytes Relative 30.0 12.0 - 46.0 %   Monocytes Relative 9.6 3.0 - 12.0 %   Eosinophils Relative 5.1 (H) 0.0 - 5.0 %   Basophils Relative 0.4 0.0 - 3.0 %   Neutro Abs 4.1 1.4 - 7.7 K/uL   Lymphs Abs 2.2 0.7 - 4.0 K/uL   Monocytes Absolute 0.7 0.1 - 1.0 K/uL   Eosinophils Absolute 0.4 0.0 - 0.7 K/uL   Basophils Absolute 0.0 0.0 - 0.1 K/uL    Assessment/Plan: 1. Gastroesophageal reflux disease without esophagitis Stop Omeprazole. Rx 2-week trial of Protonix once daily. Use pepcid at night x 2-3 nights until Protonix gets in her system. GERD diet reviewed. Handout given. Also recommended daily probiotic. Follow-up 2 weeks for reassessment. Can be with myself or PCP. RTC sooner if any new or worsening  symptoms.  - pantoprazole (PROTONIX) 40 MG tablet; Take 1 tablet (40 mg total) by mouth daily.  Dispense: 30 tablet; Refill: 3   Leeanne Rio, [place]

## 2019-09-01 NOTE — Patient Instructions (Signed)
Please stop the Omeprazole and start the Pantoprazole once daily.  Take a Pepcid nightly for the next 2-3 days.  Follow the diet for reflux below.  I want to see you in 2 weeks for reassessment. Return or call sooner if needed.    Food Choices for Gastroesophageal Reflux Disease, Adult When you have gastroesophageal reflux disease (GERD), the foods you eat and your eating habits are very important. Choosing the right foods can help ease your discomfort. Think about working with a nutrition specialist (dietitian) to help you make good choices. What are tips for following this plan?  Meals  Choose healthy foods that are low in fat, such as fruits, vegetables, whole grains, low-fat dairy products, and lean meat, fish, and poultry.  Eat small meals often instead of 3 large meals a day. Eat your meals slowly, and in a place where you are relaxed. Avoid bending over or lying down until 2-3 hours after eating.  Avoid eating meals 2-3 hours before bed.  Avoid drinking a lot of liquid with meals.  Cook foods using methods other than frying. Bake, grill, or broil food instead.  Avoid or limit: ? Chocolate. ? Peppermint or spearmint. ? Alcohol. ? Pepper. ? Black and decaffeinated coffee. ? Black and decaffeinated tea. ? Bubbly (carbonated) soft drinks. ? Caffeinated energy drinks and soft drinks.  Limit high-fat foods such as: ? Fatty meat or fried foods. ? Whole milk, cream, butter, or ice cream. ? Nuts and nut butters. ? Pastries, donuts, and sweets made with butter or shortening.  Avoid foods that cause symptoms. These foods may be different for everyone. Common foods that cause symptoms include: ? Tomatoes. ? Oranges, lemons, and limes. ? Peppers. ? Spicy food. ? Onions and garlic. ? Vinegar. Lifestyle  Maintain a healthy weight. Ask your doctor what weight is healthy for you. If you need to lose weight, work with your doctor to do so safely.  Exercise for at least 30  minutes for 5 or more days each week, or as told by your doctor.  Wear loose-fitting clothes.  Do not smoke. If you need help quitting, ask your doctor.  Sleep with the head of your bed higher than your feet. Use a wedge under the mattress or blocks under the bed frame to raise the head of the bed. Summary  When you have gastroesophageal reflux disease (GERD), food and lifestyle choices are very important in easing your symptoms.  Eat small meals often instead of 3 large meals a day. Eat your meals slowly, and in a place where you are relaxed.  Limit high-fat foods such as fatty meat or fried foods.  Avoid bending over or lying down until 2-3 hours after eating.  Avoid peppermint and spearmint, caffeine, alcohol, and chocolate. This information is not intended to replace advice given to you by your health care provider. Make sure you discuss any questions you have with your health care provider. Document Released: 04/28/2012 Document Revised: 02/18/2019 Document Reviewed: 12/03/2016 Elsevier Patient Education  2020 [employer].

## 2019-09-09 ENCOUNTER — Other Ambulatory Visit: Payer: Self-pay | Admitting: Family Medicine

## 2019-09-11 ENCOUNTER — Other Ambulatory Visit: Payer: Self-pay | Admitting: Family Medicine

## 2019-09-13 ENCOUNTER — Ambulatory Visit: Payer: Medicare Other | Admitting: Physician Assistant

## 2019-09-13 ENCOUNTER — Encounter: Payer: Self-pay | Admitting: Physician Assistant

## 2019-09-13 ENCOUNTER — Other Ambulatory Visit: Payer: Self-pay

## 2019-09-13 VITALS — BP 110/60 | HR 65 | Temp 98.4°F | Resp 16 | Ht 66.0 in | Wt 158.0 lb

## 2019-09-13 DIAGNOSIS — K219 Gastro-esophageal reflux disease without esophagitis: Secondary | ICD-10-CM

## 2019-09-13 NOTE — Progress Notes (Signed)
Patient presents to clinic today c/o GERD. Epigastric pain has resolved, appetite has improved. Still notes waves of nausea, occur a couple times of day typically after she eats, last approximately 30 minutes then pass. Has not identified any foods as triggers. Has some episodes of nausea during the night for which she will wake up and take pepcid with relief of symptoms. Endorses frequent burping and dry morning cough. Denies chest pain or back pain. No changes in bowel habits.   Past Medical History:  Diagnosis Date  . DVT (deep venous thrombosis) ([place]) 09/2015   RLE  . GERD (gastroesophageal reflux disease)   . Hypertension   . Hypothyroidism   . 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 Peripheral vascular disease ([place])    peripheral neuropathy    Current Outpatient Medications on File Prior to Visit  Medication Sig Dispense Refill  . Acetaminophen (TYLENOL PO) Take by mouth as needed.    Marland Kitchen amLODipine (NORVASC) 10 MG tablet TAKE 1 TABLET BY MOUTH EVERY DAY 90 tablet 1  . celecoxib (CELEBREX) 100 MG capsule TAKE 1 CAPSULE BY MOUTH TWICE A DAY 60 capsule 3  . gabapentin (NEURONTIN) 300 MG capsule TAKE 2 CAPSULES BY MOUTH AT BEDTIME 180 capsule 1  . levothyroxine (SYNTHROID) 50 MCG tablet TAKE 1 TABLET BY MOUTH EVERY DAY 90 tablet 1  . Multiple Vitamins-Minerals (PRESERVISION AREDS 2) CAPS Take by mouth.    . nadolol (CORGARD) 80 MG tablet TAKE 1 TABLET BY MOUTH EVERY DAY 90 tablet 1  . omeprazole (PRILOSEC) 20 MG capsule TAKE 1 CAPSULE BY MOUTH EVERY DAY 90 capsule 2  . pantoprazole (PROTONIX) 40 MG tablet Take 1 tablet (40 mg total) by mouth daily. 30 tablet 3   No current facility-administered medications on file prior to visit.     Allergies  Allergen Reactions  . Codeine Other (See Comments)    Nightmares, imagining things  . Keflex [Cephalexin] Nausea Only    Pt ended up in ER w/ CP    Family History  Problem Relation Age of Onset  .  Cancer Mother        BREAST  . COPD Father   . Emphysema Father   . Cancer Daughter        breast    Social History   Socioeconomic History  . Marital status: Widowed    Spouse name: Not on file  . Number of children: Not on file  . Years of education: Not on file  . Highest education level: Not on file  Occupational History  . Not on file  Social Needs  . Financial resource strain: Not on file  . Food insecurity    Worry: Not on file    Inability: Not on file  . Transportation needs    Medical: Not on file    Non-medical: Not on file  Tobacco Use  . Smoking status: Never Smoker  . Smokeless tobacco: Never Used  Substance and Sexual Activity  . Alcohol use: No  . Drug use: No  . Sexual activity: Never  Lifestyle  . Physical activity    Days per week: Not on file    Minutes per session: Not on file  . Stress: Not on file  Relationships  . Social Herbalist on phone: Not on file    Gets together: Not on file    Attends religious service: Not on file    Active member of  club or organization: Not on file    Attends meetings of clubs or organizations: Not on file    Relationship status: Not on file  Other Topics Concern  . Not on file  Social History Narrative  . Not on file   Review of Systems - See HPI.  All other ROS are negative.  BP 110/60   Pulse 65   Temp 98.4 F (36.9 C) (Temporal)   Resp 16   Ht 5\' 6"  (1.676 m)   Wt 158 lb (71.7 kg)   SpO2 98%   BMI 25.50 kg/m   Physical Exam Constitutional:      General: She is not in acute distress.    Appearance: Normal appearance. She is well-developed. She is not ill-appearing.  HENT:     Head: Normocephalic and atraumatic.  Cardiovascular:     Rate and Rhythm: Normal rate and regular rhythm.     Heart sounds: Normal heart sounds, S1 normal and S2 normal. No murmur.  Pulmonary:     Effort: Pulmonary effort is normal.  Abdominal:     General: Abdomen is flat. Bowel sounds are normal. There  is no distension.     Palpations: Abdomen is soft. There is no hepatomegaly or mass.     Tenderness: There is no abdominal tenderness. There is no guarding or rebound.  Skin:    General: Skin is warm and dry.  Neurological:     General: No focal deficit present.     Mental Status: She is alert.  Psychiatric:        Thought Content: Thought content normal.        Judgment: Judgment normal.     Recent Results (from the past 2160 hour(s))  Lipid panel     Status: Abnormal   Collection Time: 06/22/19 11:12 AM  Result Value Ref Range   Cholesterol 190 0 - 200 mg/dL    Comment: ATP III Classification       Desirable:  < 200 mg/dL               Borderline High:  200 - 239 mg/dL          High:  > = 240 mg/dL   Triglycerides 169.0 (H) 0.0 - 149.0 mg/dL    Comment: Normal:  <150 mg/dLBorderline High:  150 - 199 mg/dL   HDL 42.60 >39.00 mg/dL   VLDL 33.8 0.0 - 40.0 mg/dL   LDL Cholesterol 114 (H) 0 - 99 mg/dL   Total CHOL/HDL Ratio 4     Comment:                Men          Women1/2 Average Risk     3.4          3.3Average Risk          5.0          4.42X Average Risk          9.6          7.13X Average Risk          15.0          11.0                       NonHDL 147.74     Comment: NOTE:  Non-HDL goal should be 30 mg/dL higher than patient's LDL goal (i.e. LDL goal of < 70 mg/dL, would have non-HDL goal of <  100 mg/dL)  Basic metabolic panel     Status: Abnormal   Collection Time: 06/22/19 11:12 AM  Result Value Ref Range   Sodium 138 135 - 145 mEq/L   Potassium 4.9 3.5 - 5.1 mEq/L   Chloride 105 96 - 112 mEq/L   CO2 27 19 - 32 mEq/L   Glucose, Bld 90 70 - 99 mg/dL   BUN 19 6 - 23 mg/dL   Creatinine, Ser 1.00 0.40 - 1.20 mg/dL   Calcium 9.6 8.4 - 10.5 mg/dL   GFR 51.60 (L) >60.00 mL/min  TSH     Status: None   Collection Time: 06/22/19 11:12 AM  Result Value Ref Range   TSH 1.42 0.35 - 4.50 uIU/mL  Hepatic function panel     Status: None   Collection Time: 06/22/19 11:12 AM   Result Value Ref Range   Total Bilirubin 0.5 0.2 - 1.2 mg/dL   Bilirubin, Direct 0.1 0.0 - 0.3 mg/dL   Alkaline Phosphatase 106 39 - 117 U/L   AST 15 0 - 37 U/L   ALT 12 0 - 35 U/L   Total Protein 6.6 6.0 - 8.3 g/dL   Albumin 4.1 3.5 - 5.2 g/dL  CBC with Differential/Platelet     Status: Abnormal   Collection Time: 06/22/19 11:12 AM  Result Value Ref Range   WBC 7.4 4.0 - 10.5 K/uL   RBC 4.26 3.87 - 5.11 Mil/uL   Hemoglobin 12.8 12.0 - 15.0 g/dL   HCT 39.0 36.0 - 46.0 %   MCV 91.6 78.0 - 100.0 fl   MCHC 32.9 30.0 - 36.0 g/dL   RDW 13.3 11.5 - 15.5 %   Platelets 286.0 150.0 - 400.0 K/uL   Neutrophils Relative % 54.9 43.0 - 77.0 %   Lymphocytes Relative 30.0 12.0 - 46.0 %   Monocytes Relative 9.6 3.0 - 12.0 %   Eosinophils Relative 5.1 (H) 0.0 - 5.0 %   Basophils Relative 0.4 0.0 - 3.0 %   Neutro Abs 4.1 1.4 - 7.7 K/uL   Lymphs Abs 2.2 0.7 - 4.0 K/uL   Monocytes Absolute 0.7 0.1 - 1.0 K/uL   Eosinophils Absolute 0.4 0.0 - 0.7 K/uL   Basophils Absolute 0.0 0.0 - 0.1 K/uL   Assessment/Plan: 1. Gastroesophageal reflux disease without esophagitis Continue Protonix once daily. Use Pepcid daily before lunch to help with residual postprandial nausea. Recommended daily probiotic. GERD diet reviewed, continue to limit consumption of heavy foods. May consider referral to gastroenterology for further evaluation if nausea persists.   Leeanne Rio, PA-C

## 2019-09-13 NOTE — Patient Instructions (Signed)
-  I am glad you are doing better! -Please keep up with the Protonix once daily as directed and continue with dietary recommendations -Start a daily probiotic. -Can use the Pepcid OTC daily (midday) if needed for continued midday nausea as this seems GERD related.  - If not continuing to improve/resolve please let us know.    Food Choices for Gastroesophageal Reflux Disease, Adult When you have gastroesophageal reflux disease (GERD), the foods you eat and your eating habits are very important. Choosing the right foods can help ease your discomfort. Think about working with a nutrition specialist (dietitian) to help you make good choices. What are tips for following this plan?  Meals  Choose healthy foods that are low in fat, such as fruits, vegetables, whole grains, low-fat dairy products, and lean meat, fish, and poultry.  Eat small meals often instead of 3 large meals a day. Eat your meals slowly, and in a place where you are relaxed. Avoid bending over or lying down until 2-3 hours after eating.  Avoid eating meals 2-3 hours before bed.  Avoid drinking a lot of liquid with meals.  Cook foods using methods other than frying. Bake, grill, or broil food instead.  Avoid or limit: ? Chocolate. ? Peppermint or spearmint. ? Alcohol. ? Pepper. ? Black and decaffeinated coffee. ? Black and decaffeinated tea. ? Bubbly (carbonated) soft drinks. ? Caffeinated energy drinks and soft drinks.  Limit high-fat foods such as: ? Fatty meat or fried foods. ? Whole milk, cream, butter, or ice cream. ? Nuts and nut butters. ? Pastries, donuts, and sweets made with butter or shortening.  Avoid foods that cause symptoms. These foods may be different for everyone. Common foods that cause symptoms include: ? Tomatoes. ? Oranges, lemons, and limes. ? Peppers. ? Spicy food. ? Onions and garlic. ? Vinegar. Lifestyle  Maintain a healthy weight. Ask your doctor what weight is healthy for you. If  you need to lose weight, work with your doctor to do so safely.  Exercise for at least 30 minutes for 5 or more days each week, or as told by your doctor.  Wear loose-fitting clothes.  Do not smoke. If you need help quitting, ask your doctor.  Sleep with the head of your bed higher than your feet. Use a wedge under the mattress or blocks under the bed frame to raise the head of the bed. Summary  When you have gastroesophageal reflux disease (GERD), food and lifestyle choices are very important in easing your symptoms.  Eat small meals often instead of 3 large meals a day. Eat your meals slowly, and in a place where you are relaxed.  Limit high-fat foods such as fatty meat or fried foods.  Avoid bending over or lying down until 2-3 hours after eating.  Avoid peppermint and spearmint, caffeine, alcohol, and chocolate. This information is not intended to replace advice given to you by your health care provider. Make sure you discuss any questions you have with your health care provider. Document Released: 04/28/2012 Document Revised: 02/18/2019 Document Reviewed: 12/03/2016 Elsevier Patient Education  2020 [employer].

## 2019-09-23 ENCOUNTER — Encounter: Payer: Self-pay | Admitting: Family Medicine

## 2019-09-23 DIAGNOSIS — M2550 Pain in unspecified joint: Secondary | ICD-10-CM

## 2019-09-24 ENCOUNTER — Ambulatory Visit: Payer: Medicare Other | Admitting: Family Medicine

## 2019-09-24 ENCOUNTER — Encounter: Payer: Self-pay | Admitting: Family Medicine

## 2019-09-24 ENCOUNTER — Other Ambulatory Visit: Payer: Self-pay

## 2019-09-24 VITALS — BP 121/81 | HR 69 | Temp 98.1°F | Resp 17 | Ht 66.0 in | Wt 159.5 lb

## 2019-09-24 DIAGNOSIS — M2550 Pain in unspecified joint: Secondary | ICD-10-CM | POA: Diagnosis not present

## 2019-09-24 NOTE — Patient Instructions (Signed)
Follow up as needed or as scheduled We'll notify you of your lab results and make any changes if needed Continue the Ibuprofen up to 3x/day- take w/ food Alternate ice/heat for joint pain Call with any questions or concerns Hang in there!!

## 2019-09-24 NOTE — Progress Notes (Signed)
   Subjective:    Patient ID: [name], female    DOB: 1923/03/24, 85 y.o.   MRN: [id number]  HPI Joint pain- 'it happened so fast in the last couple of months.  I have pain in every joint in my body.'  Knees, hips, upper arms, hands.  When she first gets up in the AM 'I can hardly open them' (hands).  Pt is fearful of RA.  Some relief w/ ibuprofen.  Denies redness or swelling of joints.  Some degenerative changes of knuckles.  R knee is worse than L knee.  Pt called Dr Melissa Noon office but they required a referral and office notes.   Review of Systems For ROS see HPI     Objective:   Physical Exam Vitals signs reviewed.  Constitutional:      General: She is not in acute distress.    Appearance: She is normal weight. She is not ill-appearing.  HENT:     Head: Normocephalic and atraumatic.  Musculoskeletal:        General: Tenderness (minimal TTP over PIP and DIP joints bilaterally, no pain in feet or ankles, mild TTP over R knee, no pain in elbows bilaterally) and deformity (mild nodularity of DIP and PIP joints bilaterally) present. No swelling.  Skin:    General: Skin is warm and dry.     Findings: No rash.  Neurological:     General: No focal deficit present.     Mental Status: She is alert and oriented to person, place, and time.     Cranial Nerves: No cranial nerve deficit.     Sensory: No sensory deficit.     Motor: No weakness.     Gait: Gait normal.     Deep Tendon Reflexes: Reflexes normal.  Psychiatric:        Mood and Affect: Mood normal.        Behavior: Behavior normal.        Thought Content: Thought content normal.           Assessment & Plan:  Polyarthralgia- new to provider, ongoing for pt.  She is in no distress today and appears quite comfortable but indicates that her pain is severe.  States sxs started suddenly and involve multiple joints- particularly her hands.  She called rheumatology to schedule a visit but they indicated she needed a  referral.  We tried to refer but had no supportive office notes.  She is distraught by the thought of RA.  Will check ANA, ESR, and RF.  Pt expressed understanding and is in agreement w/ plan.

## 2019-09-27 ENCOUNTER — Encounter: Payer: Self-pay | Admitting: Family Medicine

## 2019-10-04 LAB — ANA: Anti Nuclear Antibody (ANA): POSITIVE — AB

## 2019-10-04 LAB — RHEUMATOID FACTOR: Rheumatoid fact SerPl-aCnc: <14 IU/mL (ref <14)

## 2019-10-04 LAB — SEDIMENTATION RATE: Sed Rate: 28 mm/h (ref 0–30)

## 2019-10-04 LAB — ANTI-NUCLEAR AB-TITER (ANA TITER): ANA Titer 1: 1:40 {titer} — ABNORMAL HIGH

## 2019-10-12 ENCOUNTER — Telehealth: Payer: Self-pay | Admitting: General Practice

## 2019-10-12 NOTE — Telephone Encounter (Signed)
[facility] called and said due to a mislabeling of sample [name] ANA that was originally listed as negative was actually positive. New update in the chart for review. Please advise

## 2019-10-13 NOTE — Telephone Encounter (Signed)
I saw that and pt has since had Rheumatology appt.  No further action needed

## 2019-11-16 ENCOUNTER — Ambulatory Visit: Payer: Medicare PPO | Admitting: Family Medicine

## 2019-11-16 ENCOUNTER — Encounter: Payer: Self-pay | Admitting: Family Medicine

## 2019-11-16 ENCOUNTER — Other Ambulatory Visit: Payer: Self-pay

## 2019-11-16 VITALS — BP 140/80 | HR 70 | Temp 97.9°F | Resp 16 | Ht 66.0 in | Wt 160.4 lb

## 2019-11-16 DIAGNOSIS — M25552 Pain in left hip: Secondary | ICD-10-CM

## 2019-11-16 DIAGNOSIS — M545 Low back pain: Secondary | ICD-10-CM | POA: Diagnosis not present

## 2019-11-16 NOTE — Progress Notes (Signed)
   Subjective:    Patient ID: [name], female    DOB: 1923-02-22, 86 y.o.   MRN: [id number]  HPI Hip pain- pt tripped over open dishwasher on 12/25 and fell on L hip.  No crack or pop when it first happened.  Pt reports she was 'just a little sore' the 1st few days but worsened as of 1/1.  No swelling or visible bruising.  Area is painful to touch.  No pain w/ sitting unless she shifts her weight to L side.  Pt is unable to bear weight on L leg w/o severe pain.  Pt goes to [facility]- Dr Tonita Cong.  Pt has been on daily prednisone.   Review of Systems For ROS see HPI   This visit occurred during the SARS-CoV-2 public health emergency.  Safety protocols were in place, including screening questions prior to the visit, additional usage of staff PPE, and extensive cleaning of exam room while observing appropriate contact time as indicated for disinfecting solutions.       Objective:   Physical Exam Vitals reviewed.  Constitutional:      General: She is not in acute distress.    Appearance: Normal appearance.  Cardiovascular:     Pulses: Normal pulses.  Musculoskeletal:        General: Tenderness (TTP over L greater trochanter and along posterior acetabulum) present. No swelling or deformity.  Neurological:     General: No focal deficit present.     Mental Status: She is alert and oriented to person, place, and time.     Gait: Gait abnormal (antalgic gait, unable to bear weight on L side w/o pain).           Assessment & Plan:  L hip pain- new.  Pt fell on 12/25 but sxs acutely worsened on 1/1.  She was ambulating between 12/25 and 1/1 and then on 1/1 was not able to bear weight w/o assistance of cane or walker.  Now has considerable pain w/ weight bearing.  No visible deformity or leg shortening but intense TTP over L greater trochanter and along posterior acetabulum.  Concern for non-displaced hip fracture.  Called ortho office and asked for same day appt for pt to avoid ER given  her age and ongoing COVID crisis.  Pt will leave this office and proceed directly to [facility] for evaluation and tx.  Pt and family expressed understanding.

## 2019-11-18 DIAGNOSIS — M25552 Pain in left hip: Secondary | ICD-10-CM | POA: Diagnosis not present

## 2019-11-18 DIAGNOSIS — M7062 Trochanteric bursitis, left hip: Secondary | ICD-10-CM | POA: Diagnosis not present

## 2019-11-19 ENCOUNTER — Encounter: Payer: Self-pay | Admitting: Family Medicine

## 2019-11-22 DIAGNOSIS — M25552 Pain in left hip: Secondary | ICD-10-CM | POA: Diagnosis not present

## 2019-11-23 ENCOUNTER — Encounter: Payer: Self-pay | Admitting: Family Medicine

## 2019-11-23 ENCOUNTER — Other Ambulatory Visit: Payer: Self-pay | Admitting: Physician Assistant

## 2019-11-23 DIAGNOSIS — K219 Gastro-esophageal reflux disease without esophagitis: Secondary | ICD-10-CM

## 2019-11-23 NOTE — Telephone Encounter (Signed)
Will defer further refills of patient's medications to PCP  

## 2019-11-24 ENCOUNTER — Other Ambulatory Visit: Payer: Medicare PPO

## 2019-11-25 DIAGNOSIS — M25552 Pain in left hip: Secondary | ICD-10-CM | POA: Diagnosis not present

## 2019-11-29 DIAGNOSIS — M25552 Pain in left hip: Secondary | ICD-10-CM | POA: Diagnosis not present

## 2019-12-02 DIAGNOSIS — M25552 Pain in left hip: Secondary | ICD-10-CM | POA: Diagnosis not present

## 2019-12-07 DIAGNOSIS — M25552 Pain in left hip: Secondary | ICD-10-CM | POA: Diagnosis not present

## 2019-12-09 DIAGNOSIS — M25561 Pain in right knee: Secondary | ICD-10-CM | POA: Diagnosis not present

## 2019-12-09 DIAGNOSIS — M25552 Pain in left hip: Secondary | ICD-10-CM | POA: Diagnosis not present

## 2019-12-09 DIAGNOSIS — M1711 Unilateral primary osteoarthritis, right knee: Secondary | ICD-10-CM | POA: Diagnosis not present

## 2019-12-10 DIAGNOSIS — M25552 Pain in left hip: Secondary | ICD-10-CM | POA: Diagnosis not present

## 2019-12-13 ENCOUNTER — Ambulatory Visit: Payer: Medicare PPO

## 2019-12-13 DIAGNOSIS — M25552 Pain in left hip: Secondary | ICD-10-CM | POA: Diagnosis not present

## 2019-12-16 DIAGNOSIS — M25552 Pain in left hip: Secondary | ICD-10-CM | POA: Diagnosis not present

## 2019-12-19 ENCOUNTER — Ambulatory Visit: Payer: Medicare PPO

## 2019-12-20 DIAGNOSIS — M25552 Pain in left hip: Secondary | ICD-10-CM | POA: Diagnosis not present

## 2019-12-22 DIAGNOSIS — M25552 Pain in left hip: Secondary | ICD-10-CM | POA: Diagnosis not present

## 2019-12-27 ENCOUNTER — Encounter: Payer: Self-pay | Admitting: Family Medicine

## 2019-12-29 ENCOUNTER — Inpatient Hospital Stay (HOSPITAL_BASED_OUTPATIENT_CLINIC_OR_DEPARTMENT_OTHER)
Admission: EM | Admit: 2019-12-29 | Discharge: 2020-01-14 | DRG: 329 | Disposition: A | Discharge status: Rehabilitation Facility | Payer: Medicare PPO | Source: Ambulatory Visit | Attending: Internal Medicine | Admitting: Internal Medicine

## 2019-12-29 ENCOUNTER — Telehealth: Payer: Self-pay

## 2019-12-29 ENCOUNTER — Encounter (HOSPITAL_BASED_OUTPATIENT_CLINIC_OR_DEPARTMENT_OTHER): Payer: Self-pay | Admitting: *Deleted

## 2019-12-29 ENCOUNTER — Other Ambulatory Visit: Payer: Self-pay

## 2019-12-29 ENCOUNTER — Emergency Department (HOSPITAL_BASED_OUTPATIENT_CLINIC_OR_DEPARTMENT_OTHER): Payer: Medicare PPO

## 2019-12-29 DIAGNOSIS — K5720 Diverticulitis of large intestine with perforation and abscess without bleeding: Principal | ICD-10-CM | POA: Diagnosis present

## 2019-12-29 DIAGNOSIS — K8020 Calculus of gallbladder without cholecystitis without obstruction: Secondary | ICD-10-CM | POA: Diagnosis not present

## 2019-12-29 DIAGNOSIS — S52502D Unspecified fracture of the lower end of left radius, subsequent encounter for closed fracture with routine healing: Secondary | ICD-10-CM | POA: Diagnosis not present

## 2019-12-29 DIAGNOSIS — S52392A Other fracture of shaft of radius, left arm, initial encounter for closed fracture: Secondary | ICD-10-CM | POA: Diagnosis not present

## 2019-12-29 DIAGNOSIS — Z6825 Body mass index (BMI) 25.0-25.9, adult: Secondary | ICD-10-CM

## 2019-12-29 DIAGNOSIS — D72828 Other elevated white blood cell count: Secondary | ICD-10-CM | POA: Diagnosis not present

## 2019-12-29 DIAGNOSIS — M353 Polymyalgia rheumatica: Secondary | ICD-10-CM | POA: Diagnosis present

## 2019-12-29 DIAGNOSIS — Z79890 Hormone replacement therapy (postmenopausal): Secondary | ICD-10-CM

## 2019-12-29 DIAGNOSIS — K5780 Diverticulitis of intestine, part unspecified, with perforation and abscess without bleeding: Secondary | ICD-10-CM | POA: Diagnosis not present

## 2019-12-29 DIAGNOSIS — I482 Chronic atrial fibrillation, unspecified: Secondary | ICD-10-CM | POA: Diagnosis not present

## 2019-12-29 DIAGNOSIS — K219 Gastro-esophageal reflux disease without esophagitis: Secondary | ICD-10-CM | POA: Diagnosis not present

## 2019-12-29 DIAGNOSIS — T1490XA Injury, unspecified, initial encounter: Secondary | ICD-10-CM

## 2019-12-29 DIAGNOSIS — G8918 Other acute postprocedural pain: Secondary | ICD-10-CM | POA: Diagnosis not present

## 2019-12-29 DIAGNOSIS — I129 Hypertensive chronic kidney disease with stage 1 through stage 4 chronic kidney disease, or unspecified chronic kidney disease: Secondary | ICD-10-CM | POA: Diagnosis present

## 2019-12-29 DIAGNOSIS — R109 Unspecified abdominal pain: Secondary | ICD-10-CM | POA: Diagnosis not present

## 2019-12-29 DIAGNOSIS — K650 Generalized (acute) peritonitis: Secondary | ICD-10-CM | POA: Diagnosis not present

## 2019-12-29 DIAGNOSIS — R001 Bradycardia, unspecified: Secondary | ICD-10-CM | POA: Diagnosis not present

## 2019-12-29 DIAGNOSIS — R0902 Hypoxemia: Secondary | ICD-10-CM

## 2019-12-29 DIAGNOSIS — I4819 Other persistent atrial fibrillation: Secondary | ICD-10-CM

## 2019-12-29 DIAGNOSIS — Z885 Allergy status to narcotic agent status: Secondary | ICD-10-CM

## 2019-12-29 DIAGNOSIS — W182XXA Fall in (into) shower or empty bathtub, initial encounter: Secondary | ICD-10-CM | POA: Diagnosis present

## 2019-12-29 DIAGNOSIS — R7689 Other specified abnormal immunological findings in serum: Secondary | ICD-10-CM | POA: Insufficient documentation

## 2019-12-29 DIAGNOSIS — Z85820 Personal history of malignant melanoma of skin: Secondary | ICD-10-CM | POA: Diagnosis not present

## 2019-12-29 DIAGNOSIS — R339 Retention of urine, unspecified: Secondary | ICD-10-CM | POA: Diagnosis not present

## 2019-12-29 DIAGNOSIS — S2242XA Multiple fractures of ribs, left side, initial encounter for closed fracture: Secondary | ICD-10-CM | POA: Diagnosis not present

## 2019-12-29 DIAGNOSIS — N1831 Chronic kidney disease, stage 3a: Secondary | ICD-10-CM | POA: Diagnosis not present

## 2019-12-29 DIAGNOSIS — S52502A Unspecified fracture of the lower end of left radius, initial encounter for closed fracture: Secondary | ICD-10-CM | POA: Diagnosis not present

## 2019-12-29 DIAGNOSIS — R110 Nausea: Secondary | ICD-10-CM | POA: Diagnosis not present

## 2019-12-29 DIAGNOSIS — E781 Pure hyperglyceridemia: Secondary | ICD-10-CM | POA: Diagnosis present

## 2019-12-29 DIAGNOSIS — Z472 Encounter for removal of internal fixation device: Secondary | ICD-10-CM | POA: Diagnosis not present

## 2019-12-29 DIAGNOSIS — R197 Diarrhea, unspecified: Secondary | ICD-10-CM

## 2019-12-29 DIAGNOSIS — Z01810 Encounter for preprocedural cardiovascular examination: Secondary | ICD-10-CM | POA: Diagnosis not present

## 2019-12-29 DIAGNOSIS — S52302A Unspecified fracture of shaft of left radius, initial encounter for closed fracture: Secondary | ICD-10-CM | POA: Diagnosis not present

## 2019-12-29 DIAGNOSIS — J9811 Atelectasis: Secondary | ICD-10-CM | POA: Diagnosis not present

## 2019-12-29 DIAGNOSIS — R5381 Other malaise: Secondary | ICD-10-CM | POA: Diagnosis not present

## 2019-12-29 DIAGNOSIS — N179 Acute kidney failure, unspecified: Secondary | ICD-10-CM

## 2019-12-29 DIAGNOSIS — M8580 Other specified disorders of bone density and structure, unspecified site: Secondary | ICD-10-CM | POA: Diagnosis present

## 2019-12-29 DIAGNOSIS — I10 Essential (primary) hypertension: Secondary | ICD-10-CM | POA: Diagnosis not present

## 2019-12-29 DIAGNOSIS — E039 Hypothyroidism, unspecified: Secondary | ICD-10-CM | POA: Diagnosis not present

## 2019-12-29 DIAGNOSIS — Z933 Colostomy status: Secondary | ICD-10-CM | POA: Diagnosis not present

## 2019-12-29 DIAGNOSIS — N189 Chronic kidney disease, unspecified: Secondary | ICD-10-CM | POA: Diagnosis not present

## 2019-12-29 DIAGNOSIS — S2242XG Multiple fractures of ribs, left side, subsequent encounter for fracture with delayed healing: Secondary | ICD-10-CM | POA: Diagnosis not present

## 2019-12-29 DIAGNOSIS — Z20822 Contact with and (suspected) exposure to covid-19: Secondary | ICD-10-CM | POA: Diagnosis present

## 2019-12-29 DIAGNOSIS — I959 Hypotension, unspecified: Secondary | ICD-10-CM | POA: Diagnosis not present

## 2019-12-29 DIAGNOSIS — R55 Syncope and collapse: Secondary | ICD-10-CM | POA: Diagnosis not present

## 2019-12-29 DIAGNOSIS — R296 Repeated falls: Secondary | ICD-10-CM | POA: Diagnosis not present

## 2019-12-29 DIAGNOSIS — I480 Paroxysmal atrial fibrillation: Secondary | ICD-10-CM | POA: Diagnosis not present

## 2019-12-29 DIAGNOSIS — Z86718 Personal history of other venous thrombosis and embolism: Secondary | ICD-10-CM

## 2019-12-29 DIAGNOSIS — E860 Dehydration: Secondary | ICD-10-CM | POA: Diagnosis present

## 2019-12-29 DIAGNOSIS — Z90710 Acquired absence of both cervix and uterus: Secondary | ICD-10-CM

## 2019-12-29 DIAGNOSIS — R338 Other retention of urine: Secondary | ICD-10-CM

## 2019-12-29 DIAGNOSIS — E43 Unspecified severe protein-calorie malnutrition: Secondary | ICD-10-CM | POA: Insufficient documentation

## 2019-12-29 DIAGNOSIS — R768 Other specified abnormal immunological findings in serum: Secondary | ICD-10-CM | POA: Insufficient documentation

## 2019-12-29 DIAGNOSIS — S2249XA Multiple fractures of ribs, unspecified side, initial encounter for closed fracture: Secondary | ICD-10-CM | POA: Diagnosis present

## 2019-12-29 DIAGNOSIS — S3991XA Unspecified injury of abdomen, initial encounter: Secondary | ICD-10-CM | POA: Diagnosis not present

## 2019-12-29 DIAGNOSIS — J9601 Acute respiratory failure with hypoxia: Secondary | ICD-10-CM | POA: Diagnosis not present

## 2019-12-29 DIAGNOSIS — K5900 Constipation, unspecified: Secondary | ICD-10-CM | POA: Diagnosis not present

## 2019-12-29 DIAGNOSIS — S52592A Other fractures of lower end of left radius, initial encounter for closed fracture: Secondary | ICD-10-CM | POA: Diagnosis present

## 2019-12-29 DIAGNOSIS — L0291 Cutaneous abscess, unspecified: Secondary | ICD-10-CM

## 2019-12-29 DIAGNOSIS — Z79899 Other long term (current) drug therapy: Secondary | ICD-10-CM

## 2019-12-29 DIAGNOSIS — Z881 Allergy status to other antibiotic agents status: Secondary | ICD-10-CM

## 2019-12-29 DIAGNOSIS — R0602 Shortness of breath: Secondary | ICD-10-CM

## 2019-12-29 DIAGNOSIS — W19XXXA Unspecified fall, initial encounter: Secondary | ICD-10-CM | POA: Diagnosis not present

## 2019-12-29 DIAGNOSIS — K659 Peritonitis, unspecified: Secondary | ICD-10-CM | POA: Diagnosis not present

## 2019-12-29 DIAGNOSIS — D649 Anemia, unspecified: Secondary | ICD-10-CM | POA: Diagnosis not present

## 2019-12-29 DIAGNOSIS — D62 Acute posthemorrhagic anemia: Secondary | ICD-10-CM | POA: Diagnosis not present

## 2019-12-29 DIAGNOSIS — Z0000 Encounter for general adult medical examination without abnormal findings: Secondary | ICD-10-CM

## 2019-12-29 DIAGNOSIS — S52502G Unspecified fracture of the lower end of left radius, subsequent encounter for closed fracture with delayed healing: Secondary | ICD-10-CM | POA: Diagnosis not present

## 2019-12-29 DIAGNOSIS — R188 Other ascites: Secondary | ICD-10-CM

## 2019-12-29 DIAGNOSIS — I4821 Permanent atrial fibrillation: Secondary | ICD-10-CM | POA: Diagnosis not present

## 2019-12-29 DIAGNOSIS — I4891 Unspecified atrial fibrillation: Secondary | ICD-10-CM | POA: Diagnosis not present

## 2019-12-29 DIAGNOSIS — R52 Pain, unspecified: Secondary | ICD-10-CM | POA: Diagnosis not present

## 2019-12-29 DIAGNOSIS — D638 Anemia in other chronic diseases classified elsewhere: Secondary | ICD-10-CM | POA: Diagnosis not present

## 2019-12-29 DIAGNOSIS — S52502S Unspecified fracture of the lower end of left radius, sequela: Secondary | ICD-10-CM | POA: Diagnosis not present

## 2019-12-29 DIAGNOSIS — Z7952 Long term (current) use of systemic steroids: Secondary | ICD-10-CM

## 2019-12-29 DIAGNOSIS — R42 Dizziness and giddiness: Secondary | ICD-10-CM | POA: Diagnosis not present

## 2019-12-29 DIAGNOSIS — G629 Polyneuropathy, unspecified: Secondary | ICD-10-CM | POA: Diagnosis present

## 2019-12-29 DIAGNOSIS — I739 Peripheral vascular disease, unspecified: Secondary | ICD-10-CM | POA: Diagnosis present

## 2019-12-29 DIAGNOSIS — K651 Peritoneal abscess: Secondary | ICD-10-CM | POA: Diagnosis not present

## 2019-12-29 HISTORY — DX: Unspecified fracture of the lower end of left radius, initial encounter for closed fracture: S52.502A

## 2019-12-29 LAB — URINALYSIS, ROUTINE W REFLEX MICROSCOPIC
Bilirubin Urine: NEGATIVE
Glucose, UA: NEGATIVE mg/dL
Hgb urine dipstick: NEGATIVE
Ketones, ur: NEGATIVE mg/dL
Leukocytes,Ua: NEGATIVE
Nitrite: NEGATIVE
Protein, ur: NEGATIVE mg/dL
Specific Gravity, Urine: 1.02 (ref 1.005–1.030)
pH: 5.5 (ref 5.0–8.0)

## 2019-12-29 LAB — CBC WITH DIFFERENTIAL/PLATELET
Abs Immature Granulocytes: 0.09 10*3/uL — ABNORMAL HIGH (ref 0.00–0.07)
Basophils Absolute: 0 10*3/uL (ref 0.0–0.1)
Basophils Relative: 0 %
Eosinophils Absolute: 0.1 10*3/uL (ref 0.0–0.5)
Eosinophils Relative: 1 %
HCT: 38.3 % (ref 36.0–46.0)
Hemoglobin: 12.2 g/dL (ref 12.0–15.0)
Immature Granulocytes: 1 %
Lymphocytes Relative: 9 %
Lymphs Abs: 1.4 10*3/uL (ref 0.7–4.0)
MCH: 30.5 pg (ref 26.0–34.0)
MCHC: 31.9 g/dL (ref 30.0–36.0)
MCV: 95.8 fL (ref 80.0–100.0)
Monocytes Absolute: 1.7 10*3/uL — ABNORMAL HIGH (ref 0.1–1.0)
Monocytes Relative: 11 %
Neutro Abs: 11.9 10*3/uL — ABNORMAL HIGH (ref 1.7–7.7)
Neutrophils Relative %: 78 %
Platelets: 251 10*3/uL (ref 150–400)
RBC: 4 MIL/uL (ref 3.87–5.11)
RDW: 15.9 % — ABNORMAL HIGH (ref 11.5–15.5)
WBC: 15.1 10*3/uL — ABNORMAL HIGH (ref 4.0–10.5)
nRBC: 0 % (ref 0.0–0.2)

## 2019-12-29 LAB — RESPIRATORY PANEL BY RT PCR (FLU A&B, COVID)
Influenza A by PCR: NEGATIVE
Influenza B by PCR: NEGATIVE
SARS Coronavirus 2 by RT PCR: NEGATIVE

## 2019-12-29 LAB — BASIC METABOLIC PANEL
Anion gap: 8 (ref 5–15)
BUN: 31 mg/dL — ABNORMAL HIGH (ref 8–23)
CO2: 24 mmol/L (ref 22–32)
Calcium: 8.6 mg/dL — ABNORMAL LOW (ref 8.9–10.3)
Chloride: 107 mmol/L (ref 98–111)
Creatinine, Ser: 1.64 mg/dL — ABNORMAL HIGH (ref 0.44–1.00)
GFR calc Af Amer: 31 mL/min — ABNORMAL LOW (ref >60)
GFR calc non Af Amer: 26 mL/min — ABNORMAL LOW (ref >60)
Glucose, Bld: 119 mg/dL — ABNORMAL HIGH (ref 70–99)
Potassium: 3.7 mmol/L (ref 3.5–5.1)
Sodium: 139 mmol/L (ref 135–145)

## 2019-12-29 MED ORDER — OXYCODONE-ACETAMINOPHEN 5-325 MG PO TABS
1.0000 | ORAL_TABLET | ORAL | Status: DC | PRN
  Administered 2019-12-29 – 2020-01-05 (×19): 1 via ORAL
  Filled 2019-12-29 (×20): qty 1

## 2019-12-29 MED ORDER — SODIUM CHLORIDE 0.9 % IV SOLN: INTRAVENOUS | Status: AC

## 2019-12-29 MED ORDER — SODIUM CHLORIDE 0.9% FLUSH
3.0000 mL | Freq: Two times a day (BID) | INTRAVENOUS | Status: DC
  Administered 2019-12-30 – 2020-01-14 (×28): 3 mL via INTRAVENOUS

## 2019-12-29 MED ORDER — AMLODIPINE BESYLATE 10 MG PO TABS
10.0000 mg | ORAL_TABLET | Freq: Every day | ORAL | Status: DC
  Administered 2019-12-30 – 2020-01-05 (×6): 10 mg via ORAL
  Filled 2019-12-29 (×6): qty 1

## 2019-12-29 MED ORDER — ACETAMINOPHEN 325 MG PO TABS
650.0000 mg | ORAL_TABLET | Freq: Four times a day (QID) | ORAL | Status: DC | PRN
  Administered 2020-01-05 – 2020-01-10 (×2): 650 mg via ORAL
  Filled 2019-12-29 (×2): qty 2

## 2019-12-29 MED ORDER — HYDROCODONE-ACETAMINOPHEN 5-325 MG PO TABS: 1.0000 | ORAL_TABLET | Freq: Four times a day (QID) | ORAL | Status: DC | PRN

## 2019-12-29 MED ORDER — GABAPENTIN 300 MG PO CAPS
600.0000 mg | ORAL_CAPSULE | Freq: Every day | ORAL | Status: DC
  Administered 2019-12-30 – 2020-01-04 (×7): 600 mg via ORAL
  Filled 2019-12-29 (×7): qty 2

## 2019-12-29 MED ORDER — FENTANYL CITRATE (PF) 100 MCG/2ML IJ SOLN
12.5000 ug | INTRAMUSCULAR | Status: DC | PRN
  Administered 2019-12-29 (×2): 25 ug via INTRAVENOUS
  Administered 2019-12-30 (×2): 12.5 ug via INTRAVENOUS
  Administered 2019-12-30: 25 ug via INTRAVENOUS
  Filled 2019-12-29 (×6): qty 2

## 2019-12-29 MED ORDER — LACTATED RINGERS IV BOLUS
1000.0000 mL | Freq: Once | INTRAVENOUS | Status: AC
  Administered 2019-12-29: 1000 mL via INTRAVENOUS

## 2019-12-29 MED ORDER — ONDANSETRON HCL 4 MG/2ML IJ SOLN
4.0000 mg | Freq: Four times a day (QID) | INTRAMUSCULAR | Status: DC | PRN
  Administered 2019-12-29: 4 mg via INTRAVENOUS
  Filled 2019-12-29: qty 2

## 2019-12-29 MED ORDER — ACETAMINOPHEN 650 MG RE SUPP
650.0000 mg | Freq: Four times a day (QID) | RECTAL | Status: DC | PRN
  Administered 2020-01-06: 650 mg via RECTAL
  Filled 2019-12-29: qty 1

## 2019-12-29 MED ORDER — NADOLOL 40 MG PO TABS
80.0000 mg | ORAL_TABLET | Freq: Every day | ORAL | Status: DC
  Administered 2019-12-30 – 2020-01-05 (×7): 80 mg via ORAL
  Filled 2019-12-29: qty 1
  Filled 2019-12-29 (×7): qty 2
  Filled 2019-12-29: qty 1

## 2019-12-29 MED ORDER — PANTOPRAZOLE SODIUM 40 MG PO TBEC
40.0000 mg | DELAYED_RELEASE_TABLET | Freq: Every day | ORAL | Status: DC
  Administered 2019-12-30 – 2020-01-05 (×6): 40 mg via ORAL
  Filled 2019-12-29: qty 2
  Filled 2019-12-29 (×5): qty 1

## 2019-12-29 MED ORDER — ONDANSETRON HCL 4 MG/2ML IJ SOLN
4.0000 mg | Freq: Four times a day (QID) | INTRAMUSCULAR | Status: DC | PRN
  Administered 2020-01-05 – 2020-01-13 (×6): 4 mg via INTRAVENOUS
  Filled 2019-12-29 (×6): qty 2

## 2019-12-29 MED ORDER — LEVOTHYROXINE SODIUM 50 MCG PO TABS
50.0000 ug | ORAL_TABLET | Freq: Every day | ORAL | Status: DC
  Administered 2019-12-30 – 2020-01-05 (×6): 50 ug via ORAL
  Filled 2019-12-29 (×6): qty 1

## 2019-12-29 MED ORDER — HEPARIN SODIUM (PORCINE) 5000 UNIT/ML IJ SOLN
5000.0000 [IU] | Freq: Three times a day (TID) | INTRAMUSCULAR | Status: DC
  Administered 2019-12-29 – 2020-01-06 (×21): 5000 [IU] via SUBCUTANEOUS
  Filled 2019-12-29 (×22): qty 1

## 2019-12-29 MED ORDER — PREDNISONE 10 MG PO TABS
10.0000 mg | ORAL_TABLET | Freq: Every day | ORAL | Status: DC
  Administered 2019-12-30 – 2020-01-14 (×14): 10 mg via ORAL
  Filled 2019-12-29 (×14): qty 1

## 2019-12-29 NOTE — Plan of Care (Signed)
  Problem: Education: Goal: Knowledge of disease and its progression will improve Outcome: Progressing   Problem: Coping: Goal: Level of anxiety will decrease Outcome: Progressing   Problem: Pain Managment: Goal: General experience of comfort will improve Outcome: Progressing   

## 2019-12-29 NOTE — H&P (Signed)
History and Physical    [name] [id number] DOB: 1923/06/30 DOA: 12/29/2019  PCP: [name], MD   Patient coming from: Home   Chief Complaint: Fall, severe left wrist pain   HPI: [name] is a 86 y.o. female with medical history significant for hypertension, hypothyroidism, polymyalgia rheumatica, and chronic kidney disease IIIa, presenting to [facility] for evaluation of severe left wrist pain after fall.  Patient reports that she had been experiencing upset stomach and diarrhea for a few days, had been lightheaded upon standing, had a fall without any significant appreciable injury yesterday, and then fell again this morning onto her left arm.  She became lightheaded after standing up which led to the fall, but she denies hitting her head or losing consciousness.  She was experiencing immediate and severe pain at the left wrist after the fall this morning.  She reports that the diarrhea seemed to resolve as of yesterday and she is no longer having any abdominal discomfort, nausea, or chills.  She has not been coughing or short of breath.  She denies chest pain or palpitations.  She has not had much pain at the left lateral chest wall.  She was treated with fentanyl and 500 cc bolus prior to arrival.  [facility] ED Course: Upon arrival to the ED, patient is found to be afebrile, saturating low 90s on room air, slightly bradycardic, and with stable blood pressure.  EKG features a sinus rhythm with rate 58.  Radiographs are concerning for left lateral eighth, ninth, and possibly seventh rib fractures without pneumothorax or effusion.  Radiographs of the left wrist demonstrate acute oblique fracture of the distal radius at the proximal end of plate and screw fixation of remote fracture.  Chemistry panel notable for creatinine 1.64, up from an apparent baseline of 1.0. CBC is notable for leukocytosis to 15,100.  Patient was given a liter of normal saline, Zofran, and Percocet in the ED.   Hand surgery was consulted by the ED physician and indicated that the will tentatively plan for operative repair on 01/01/2020.  Review of Systems:  All other systems reviewed and apart from HPI, are negative.  Past Medical History:  Diagnosis Date  . DVT (deep venous thrombosis) ([place]) 09/2015   RLE  . GERD (gastroesophageal reflux disease)   . Hypertension   . Hypothyroidism   . 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 Peripheral vascular disease ([place])    peripheral neuropathy    Past Surgical History:  Procedure Laterality Date  . ABDOMINAL HYSTERECTOMY    . APPENDECTOMY    . BACK SURGERY    . BREAST SURGERY     left breast biopsy  . EYE SURGERY     cataract  . LIPOMA EXCISION Left 04/25/2015   Procedure: EXCISION OF LIPOMA LEFT ARM;  Surgeon: Donnie Mesa, MD;  Location: [facility];  Service: General;  Laterality: Left;  . LUMBAR LAMINECTOMY/DECOMPRESSION MICRODISCECTOMY  11/20/2011   Procedure: LUMBAR LAMINECTOMY/DECOMPRESSION MICRODISCECTOMY;  Surgeon: Jeneen Rinks P [place];  Location: [facility];  Service: Orthopedics;  Laterality: N/A;  Decompressive Laminectomy L2 to the Sacrum (X-Ray)  . OTHER SURGICAL HISTORY     left wrist surgery - has plate in left wrist   . OTHER SURGICAL HISTORY     right knee surgery due to torn cartilage   . TONSILLECTOMY    . WRIST FRACTURE SURGERY Left      reports that she has never  smoked. She has never used smokeless tobacco. She reports that she does not drink alcohol or use drugs.  Allergies  Allergen Reactions  . Codeine Other (See Comments)    Nightmares, imagining things  . Keflex [Cephalexin] Nausea Only    Pt ended up in ER w/ CP    Family History  Problem Relation Age of Onset  . Cancer Mother        BREAST  . COPD Father   . Emphysema Father   . Cancer Daughter        breast     Prior to Admission medications   Medication Sig Start Date End Date Taking? Authorizing Provider    amLODipine (NORVASC) 10 MG tablet TAKE 1 TABLET BY MOUTH EVERY DAY Patient taking differently: Take 10 mg by mouth daily.  09/09/19  Yes [name], MD  gabapentin (NEURONTIN) 300 MG capsule TAKE 2 CAPSULES BY MOUTH AT BEDTIME Patient taking differently: Take 600 mg by mouth at bedtime.  09/13/19  Yes [name], MD  Ibuprofen-Acetaminophen (ADVIL DUAL ACTION) 125-250 MG TABS Take 2 tablets by mouth as needed (pain).   Yes [provider]  levothyroxine (SYNTHROID) 50 MCG tablet TAKE 1 TABLET BY MOUTH EVERY DAY Patient taking differently: Take 50 mcg by mouth daily.  08/11/19  Yes [name], MD  Multiple Vitamins-Minerals (PRESERVISION AREDS 2) CAPS Take 1 capsule by mouth daily.    Yes [provider]  nadolol (CORGARD) 80 MG tablet TAKE 1 TABLET BY MOUTH EVERY DAY Patient taking differently: Take 80 mg by mouth daily.  09/13/19  Yes [name], MD  pantoprazole (PROTONIX) 40 MG tablet TAKE 1 TABLET BY MOUTH EVERY DAY Patient taking differently: Take 40 mg by mouth daily.  11/23/19  Yes [name], MD  predniSONE (DELTASONE) 5 MG tablet Take 10 mg by mouth daily with breakfast.    Yes [provider]  celecoxib (CELEBREX) 100 MG capsule TAKE 1 CAPSULE BY MOUTH TWICE A DAY Patient not taking: Reported on 12/29/2019 01/16/18   [name], MD    Physical Exam: Vitals:   12/29/19 1547 12/29/19 1655 12/29/19 1814 12/29/19 2153  BP: 135/67 118/66 (!) 141/64 121/69  Pulse: 60 60 60 73  Resp: 20 18 18 20   Temp: 97.7 F (36.5 C)  98.1 F (36.7 C) 98.2 F (36.8 C)  TempSrc: Oral  Oral Oral  SpO2: 95% 95% 94% 91%  Weight:   76.7 kg   Height:   5\' 6"  (1.676 m)      Constitutional: NAD, appears uncomfortable   Eyes: PERTLA, lids and conjunctivae normal ENMT: Mucous membranes are moist. Posterior pharynx clear of any exudate or lesions.   Neck: normal, supple, no masses, no thyromegaly Respiratory: clear to  auscultation bilaterally, no wheezing, no crackles. No accessory muscle use.  Cardiovascular: S1 & S2 heard, regular rate and rhythm. No extremity edema.  Abdomen: No distension, no tenderness, soft. Bowel sounds active.  Musculoskeletal: no clubbing / cyanosis. No joint deformity upper and lower extremities.   Skin: no significant rashes, lesions, ulcers. Warm, dry, well-perfused. Neurologic: No facial asymmetry. Sensation intact. Moving all extremities.  Psychiatric: Alert and oriented to person, place, and situation. Appears to be in significant pain. Cooperative.    Labs and Imaging on Admission: I have personally reviewed following labs and imaging studies  CBC: Recent Labs  Lab 12/29/19 0943  WBC 15.1*  NEUTROABS 11.9*  HGB 12.2  HCT 38.3  MCV  95.8  PLT 123XX123   Basic Metabolic Panel: Recent Labs  Lab 12/29/19 0943  NA 139  K 3.7  CL 107  CO2 24  GLUCOSE 119*  BUN 31*  CREATININE 1.64*  CALCIUM 8.6*   GFR: Estimated Creatinine Clearance: 22 mL/min (A) (by C-G formula based on SCr of 1.64 mg/dL (H)). Liver Function Tests: No results for input(s): AST, ALT, ALKPHOS, BILITOT, PROT, ALBUMIN in the last 168 hours. No results for input(s): LIPASE, AMYLASE in the last 168 hours. No results for input(s): AMMONIA in the last 168 hours. Coagulation Profile: No results for input(s): INR, PROTIME in the last 168 hours. Cardiac Enzymes: No results for input(s): CKTOTAL, CKMB, CKMBINDEX, TROPONINI in the last 168 hours. BNP (last 3 results) No results for input(s): PROBNP in the last 8760 hours. HbA1C: No results for input(s): HGBA1C in the last 72 hours. CBG: No results for input(s): GLUCAP in the last 168 hours. Lipid Profile: No results for input(s): CHOL, HDL, LDLCALC, TRIG, CHOLHDL, LDLDIRECT in the last 72 hours. Thyroid Function Tests: No results for input(s): TSH, T4TOTAL, FREET4, T3FREE, THYROIDAB in the last 72 hours. Anemia Panel: No results for input(s):  VITAMINB12, FOLATE, FERRITIN, TIBC, IRON, RETICCTPCT in the last 72 hours. Urine analysis:    Component Value Date/Time   COLORURINE YELLOW 12/29/2019 [place] 12/29/2019 1244   LABSPEC 1.020 12/29/2019 1244   PHURINE 5.5 12/29/2019 [place] 12/29/2019 1244   GLUCOSEU NEGATIVE 02/22/2016 0926   HGBUR NEGATIVE 12/29/2019 1244   BILIRUBINUR NEGATIVE 12/29/2019 1244   BILIRUBINUR negative 06/07/2019 1311   KETONESUR NEGATIVE 12/29/2019 1244   PROTEINUR NEGATIVE 12/29/2019 1244   UROBILINOGEN 0.2 06/07/2019 1311   UROBILINOGEN 0.2 02/22/2016 0926   NITRITE NEGATIVE 12/29/2019 1244   LEUKOCYTESUR NEGATIVE 12/29/2019 1244   Sepsis Labs: @LABRCNTIP (procalcitonin:4,lacticidven:4) ) Recent Results (from the past 240 hour(s))  Respiratory Panel by RT PCR (Flu A&B, Covid) - Nasopharyngeal Swab     Status: None   Collection Time: 12/29/19 12:15 PM   Specimen: Nasopharyngeal Swab  Result Value Ref Range Status   SARS Coronavirus 2 by RT PCR NEGATIVE NEGATIVE Final    Comment: (NOTE) SARS-CoV-2 target nucleic acids are NOT DETECTED. The SARS-CoV-2 RNA is generally detectable in upper respiratoy specimens during the acute phase of infection. The lowest concentration of SARS-CoV-2 viral copies this assay can detect is 131 copies/mL. A negative result does not preclude SARS-Cov-2 infection and should not be used as the sole basis for treatment or other patient management decisions. A negative result may occur with  improper specimen collection/handling, submission of specimen other than nasopharyngeal swab, presence of viral mutation(s) within the areas targeted by this assay, and inadequate number of viral copies (<131 copies/mL). A negative result must be combined with clinical observations, patient history, and epidemiological information. The expected result is Negative. Fact Sheet for Patients:  PinkCheek.be Fact Sheet for  Healthcare Providers:  GravelBags.it This test is not yet ap proved or cleared by the [place] FDA and  has been authorized for detection and/or diagnosis of SARS-CoV-2 by FDA under an Emergency Use Authorization (EUA). This EUA will remain  in effect (meaning this test can be used) for the duration of the COVID-19 declaration under Section 564(b)(1) of the Act, 21 U.S.C. section 360bbb-3(b)(1), unless the authorization is terminated or revoked sooner.    Influenza A by PCR NEGATIVE NEGATIVE Final   Influenza B by PCR NEGATIVE NEGATIVE Final    Comment: (  NOTE) The Xpert Xpress SARS-CoV-2/FLU/RSV assay is intended as an aid in  the diagnosis of influenza from Nasopharyngeal swab specimens and  should not be used as a sole basis for treatment. Nasal washings and  aspirates are unacceptable for Xpert Xpress SARS-CoV-2/FLU/RSV  testing. Fact Sheet for Patients: PinkCheek.be Fact Sheet for Healthcare Providers: GravelBags.it This test is not yet approved or cleared by the [place] FDA and  has been authorized for detection and/or diagnosis of SARS-CoV-2 by  FDA under an Emergency Use Authorization (EUA). This EUA will remain  in effect (meaning this test can be used) for the duration of the  Covid-19 declaration under Section 564(b)(1) of the Act, 21  U.S.C. section 360bbb-3(b)(1), unless the authorization is  terminated or revoked. Performed at [facility], [address] [place], [place] [address]      Radiological Exams on Admission: DG Ribs Unilateral W/Chest Left  Result Date: 12/29/2019 CLINICAL DATA:  Fall, left lateral rib pain EXAM: LEFT RIBS AND CHEST - 3+ VIEW COMPARISON:  02/16/2018 FINDINGS: Fractures through the left lateral 8th and 9th ribs and possibly the lateral 7th rib. Heart is mildly enlarged. Left base atelectasis or scarring. No pneumothorax. No effusions.  Mild wedged shaped appearance of the T11 vertebral body, age indeterminate. IMPRESSION: Left lateral 8th and 9th rib fractures and possible lateral 7th rib fracture. Left base atelectasis or scarring. No effusion or pneumothorax. Wedge-shaped appearance of the T11 vertebral body compatible with mild compression deformity, age indeterminate. Electronically Signed   By: Rolm Baptise M.D.   On: 12/29/2019 11:05   DG Wrist Complete Left  Result Date: 12/29/2019 CLINICAL DATA:  Left wrist pain after a fall today. Initial encounter. EXAM: LEFT WRIST - COMPLETE 3+ VIEW COMPARISON:  Plain films left wrist 12/24/2009. FINDINGS: Volar plate and screws are in place for fixation of a distal radius fracture which was present on the 2011 examination. There is an acute oblique fracture of the radius at the proximal end of the plate with 1/2 shaft width lateral displacement of the distal fragment. Previously seen distal radius and ulnar fractures are healed. There is ulnar positive variance. Bones appear osteopenic. IMPRESSION: Acute oblique fracture of the distal radius at the proximal end of plate and screws in place for fixation of a remote healed fracture. No other acute abnormality. Remote healed distal radius and ulnar fractures. Osteopenia. Electronically Signed   By: Inge Rise M.D.   On: 12/29/2019 11:08    EKG: Independently reviewed. Sinus rhythm, rate 58, LAD.   Assessment/Plan   1. Acute kidney injury superimposed on CKD IIIa - Presents with severe left wrist pain after a fall at home, found to have left radius and left lateral rib fractures as well as AKI  - SCr is 1.60, up from an apparent baseline of 1  - Likely prerenal azotemia in setting of recent diarrhea that has since resolved  - Continue IVF hydration, renally-dose medications, avoid nephrotoxins, repeat chem panel in am    2. Distal left radius fracture   - Presents following a fall at home and found to have acute fracture of distal  left radius at proximal end of plate and screws placed for remote fx  - Hand surgery consulting and much appreciated  - Immobilized in splint in ED, neurovascularly intact  - Continue immobilization, pain-control with IV analgesics to start and convert to oral as tolerated    3. Left lateral rib fractures - Left lateral 8th and  9th rib fractures noted on radiographs in ED, 7th left lateral rib possibly involved as well with no PTX or effusion  - Continue pain-control, incentive spirometry   4. PMR   - Continue prednisone    5. Hypertension  - Continue Norvasc and nadolol as tolerated    6. Hypothyroidism  - Continue Synthroid    DVT prophylaxis: sq heparin  Code Status: Full, confirmed with patient and her daughter at bedside Family Communication: Daughter updated at bedside Disposition Plan: She comes from home where she had been independent but now admitted d/t complications of recurrent falls. She will likely need PT assessment prior to help with disposition planning.  Consults called: Hand surgery consulted by ED physician   Admission status: Inpatient. Patient with acute renal failure, multiple rib fractures with high-risk for complications, uncontrolled pain related to distal radius fracture that will require surgical mgmt and surgeon tentatively planning to operate on 01/01/20.    Vianne Bulls, MD [facility] Pager: See [email or web address]  If 7AM-7PM, please contact the daytime attending [email or web address]  12/29/2019, 9:57 PM

## 2019-12-29 NOTE — ED Notes (Signed)
PT placed on bedpan to obtain urine spermine. Told to call out when finished, call bell given.

## 2019-12-29 NOTE — ED Notes (Signed)
Four (4) yellow metal rings, two (2) rings have clear and colored stones, placed in collection cup and labeled.

## 2019-12-29 NOTE — Telephone Encounter (Signed)
Agree w/ advice to be seen in the ER- particularly if there are visible broken bones

## 2019-12-29 NOTE — ED Triage Notes (Signed)
Per EMS, pt rec 138mcg IV Fentanyl for pain, also rec 517ml IVF bolus

## 2019-12-29 NOTE — ED Notes (Signed)
Pt wanted daughter updated  I talked to daughter in person to give update

## 2019-12-29 NOTE — Telephone Encounter (Signed)
Noted  

## 2019-12-29 NOTE — ED Provider Notes (Signed)
[facility] EMERGENCY DEPARTMENT Provider Note   CSN: [id number] Arrival date & time: 12/29/19  0913     History Chief Complaint  Patient presents with  . Fall    [name] is a 86 y.o. female.  HPI   86 year old female presenting after falls.  [place] once yesterday when she slipped out of female in the bathroom.  She fell again today though she is not completely clear as to how/why.  She has been having diarrhea for the past several days.  Has felt weak at times.  She injured her left chest in the fall yesterday.  She injured her left wrist with severe pain there in the fall the day.  She does not think she hit her head.  Denies any headache or neck pain.  Denies use of any blood thinners.  No fevers or chills.  No blood in her stool.  No acute urinary complaints.  No vomiting.  Past Medical History:  Diagnosis Date  . DVT (deep venous thrombosis) ([place]) 09/2015   RLE  . GERD (gastroesophageal reflux disease)   . Hypertension   . Hypothyroidism   . Melanoma ([place]) 06/16/2017   Facial melanoma, removed by Dr. Harvel Quale  . Peripheral vascular disease ([place])    peripheral neuropathy    Patient Active Problem List   Diagnosis Date Noted  . Osteoarthritis 11/13/2017  . Hypertriglyceridemia 01/29/2017  . Excessive gas 01/09/2017  . Physical exam 07/31/2016  . Elevated lipase 03/06/2016  . Osteopenia 02/08/2016  . Hypothyroidism 01/26/2016  . HTN (hypertension) 01/26/2016  . Left leg numbness 10/06/2012    Past Surgical History:  Procedure Laterality Date  . ABDOMINAL HYSTERECTOMY    . APPENDECTOMY    . BACK SURGERY    . BREAST SURGERY     left breast biopsy  . EYE SURGERY     cataract  . LIPOMA EXCISION Left 04/25/2015   Procedure: EXCISION OF LIPOMA LEFT ARM;  Surgeon: Donnie Mesa, MD;  Location: [facility];  Service: General;  Laterality: Left;  . LUMBAR LAMINECTOMY/DECOMPRESSION MICRODISCECTOMY  11/20/2011   Procedure: LUMBAR  LAMINECTOMY/DECOMPRESSION MICRODISCECTOMY;  Surgeon: Jeneen Rinks P [place];  Location: [facility];  Service: Orthopedics;  Laterality: N/A;  Decompressive Laminectomy L2 to the Sacrum (X-Ray)  . OTHER SURGICAL HISTORY     left wrist surgery - has plate in left wrist   . OTHER SURGICAL HISTORY     right knee surgery due to torn cartilage   . TONSILLECTOMY    . WRIST FRACTURE SURGERY Left     OB History   No obstetric history on file.    Family History  Problem Relation Age of Onset  . Cancer Mother        BREAST  . COPD Father   . Emphysema Father   . Cancer Daughter        breast    Social History   Tobacco Use  . Smoking status: Never Smoker  . Smokeless tobacco: Never Used  Substance Use Topics  . Alcohol use: No  . Drug use: No    Home Medications Prior to Admission medications   Medication Sig Start Date End Date Taking? Authorizing Provider  Acetaminophen (TYLENOL PO) Take by mouth as needed.    [provider]  amLODipine (NORVASC) 10 MG tablet TAKE 1 TABLET BY MOUTH EVERY DAY 09/09/19   [name], MD  celecoxib (CELEBREX) 100 MG capsule TAKE 1 CAPSULE BY MOUTH TWICE A DAY 01/16/18  [name], MD  gabapentin (NEURONTIN) 300 MG capsule TAKE 2 CAPSULES BY MOUTH AT BEDTIME 09/13/19   [name], MD  levothyroxine (SYNTHROID) 50 MCG tablet TAKE 1 TABLET BY MOUTH EVERY DAY 08/11/19   [name], MD  Multiple Vitamins-Minerals (PRESERVISION AREDS 2) CAPS Take by mouth.    [provider]  nadolol (CORGARD) 80 MG tablet TAKE 1 TABLET BY MOUTH EVERY DAY 09/13/19   [name], MD  pantoprazole (PROTONIX) 40 MG tablet TAKE 1 TABLET BY MOUTH EVERY DAY 11/23/19   [name], MD  predniSONE (DELTASONE) 5 MG tablet Take 12.5 mg by mouth daily with breakfast.    [provider]    Allergies    Codeine and Keflex [cephalexin]  Review of Systems   Review of Systems All systems reviewed and negative, other  than as noted in HPI.  Physical Exam Updated Vital Signs BP 128/68 (BP Location: Right Arm)   Pulse (!) 58   Temp 98.1 F (36.7 C) (Oral)   Resp 16   Ht 5\' 6"  (1.676 m)   Wt 70.3 kg   SpO2 95%   BMI 25.02 kg/m   Physical Exam Vitals and nursing note reviewed.  Constitutional:      General: She is not in acute distress.    Appearance: She is well-developed.  HENT:     Head: Normocephalic and atraumatic.  Eyes:     General:        Right eye: No discharge.        Left eye: No discharge.     Conjunctiva/sclera: Conjunctivae normal.  Cardiovascular:     Rate and Rhythm: Normal rate and regular rhythm.     Heart sounds: Normal heart sounds. No murmur. No friction rub. No gallop.   Pulmonary:     Effort: Pulmonary effort is normal. No respiratory distress.     Breath sounds: Normal breath sounds.  Abdominal:     General: There is no distension.     Palpations: Abdomen is soft.     Tenderness: There is no abdominal tenderness.  Musculoskeletal:        General: Tenderness and signs of injury present.     Cervical back: Neck supple.     Comments: Mild swelling and tenderness to the distal left forearm/wrist.  Closed injury.  Neurovascularly intact tenderness to palpation over the anterior to lateral left chest wall.  No crepitus.  No overlying skin changes.  No midline spinal tenderness.  Skin:    General: Skin is warm and dry.  Neurological:     Mental Status: She is alert.  Psychiatric:        Behavior: Behavior normal.        Thought Content: Thought content normal.     ED Results / Procedures / Treatments   Labs (all labs ordered are listed, but only abnormal results are displayed) Labs Reviewed  CBC WITH DIFFERENTIAL/PLATELET - Abnormal; Notable for the following components:      Result Value   WBC 15.1 (*)    RDW 15.9 (*)    Neutro Abs 11.9 (*)    Monocytes Absolute 1.7 (*)    Abs Immature Granulocytes 0.09 (*)    All other components within normal limits    BASIC METABOLIC PANEL - Abnormal; Notable for the following components:   Glucose, Bld 119 (*)    BUN 31 (*)    Creatinine, Ser 1.64 (*)    Calcium 8.6 (*)  GFR calc non Af Amer 26 (*)    GFR calc Af Amer 31 (*)    All other components within normal limits  RESPIRATORY PANEL BY RT PCR (FLU A&B, COVID)  URINALYSIS, ROUTINE W REFLEX MICROSCOPIC    EKG EKG Interpretation  Date/Time:  Wednesday December 29 2019 10:02:31 EST Ventricular Rate:  58 PR Interval:    QRS Duration: 91 QT Interval:  439 QTC Calculation: 432 R Axis:   -31 Text Interpretation: Sinus rhythm Left axis deviation Confirmed by Virgel Manifold [id number]) on 12/29/2019 10:40:42 AM   Radiology DG Ribs Unilateral W/Chest Left  Result Date: 12/29/2019 CLINICAL DATA:  Fall, left lateral rib pain EXAM: LEFT RIBS AND CHEST - 3+ VIEW COMPARISON:  02/16/2018 FINDINGS: Fractures through the left lateral 8th and 9th ribs and possibly the lateral 7th rib. Heart is mildly enlarged. Left base atelectasis or scarring. No pneumothorax. No effusions. Mild wedged shaped appearance of the T11 vertebral body, age indeterminate. IMPRESSION: Left lateral 8th and 9th rib fractures and possible lateral 7th rib fracture. Left base atelectasis or scarring. No effusion or pneumothorax. Wedge-shaped appearance of the T11 vertebral body compatible with mild compression deformity, age indeterminate. Electronically Signed   By: Rolm Baptise M.D.   On: 12/29/2019 11:05    Procedures Procedures (including critical care time)  Medications Ordered in ED Medications  lactated ringers bolus 1,000 mL (0 mLs Intravenous Stopped 12/29/19 1203)    ED Course  I have reviewed the triage vital signs and the nursing notes.  Pertinent labs & imaging results that were available during my care of the patient were reviewed by me and considered in my medical decision making (see chart for details).    MDM Rules/Calculators/A&P                       86 year old female with repeated falls.  Yesterday's fall sounds mechanical.  Unclear etiology of the fall today.  Reportedly hypotensive for EMS although this responded to IV fluids.  May be some degree with dehydration with recent diarrheal illness.  AKA noted.  She is given IV fluids.  Image significant for a distal left radius fracture.  Discussed with Dr. Apolonio Schneiders, orthopedic surgery.  Tentative plans for repair on Saturday.  Also multiple left-sided rib fractures.  Set of spirometry.  As needed pain medication.  Will discuss with hospitalist service given repeated falls, AKA, diarrhea and rib//wrist fractures.  Will likely need to go to rehab or some type of skilled nursing facility to recover from this.   Final Clinical Impression(s) / ED Diagnoses Final diagnoses:  Other closed fracture of distal end of left radius, initial encounter  Closed fracture of multiple ribs of left side, initial encounter  Diarrhea, unspecified type  AKI (acute kidney injury) [facility])    Rx / DC Orders ED Discharge Orders    None       Virgel Manifold, MD 12/31/19 1145

## 2019-12-29 NOTE — ED Notes (Signed)
Pt on monitor 

## 2019-12-29 NOTE — Progress Notes (Signed)
NEW ADMISSION NOTE New Admission Note: Arrived from [facility].  Arrival Method: Patient arrived with carelink on stretcher. Mental Orientation:  Alert and oriented x 4. Telemetry: 66M-15, NSR Assessment: Completed Skin:  Dry and intact,  IV: Right wrist NSL Pain: Denies any pain currently. Tubes: N/A Safety Measures: Safety Fall Prevention Plan has been given, discussed and signed Admission: Completed [facility] Orientation: Patient has been orientated to the room, unit and staff.   Patient has sling to the left arm for her fracture.   Will continue to monitor the patient. Call light has been placed within reach and bed alarm has been activated.   Amaryllis Dyke, RN

## 2019-12-29 NOTE — ED Triage Notes (Signed)
Pt fell yesterday and today, has been having diarrhea for the past few days, feeling week, presents by EMS w/ left arm splinted. Fell on carpeted flooring. Also has left flank pain.

## 2019-12-29 NOTE — Telephone Encounter (Signed)
FYI Took call from the patient's son, Zoei Busha regarding the patient's condition. He stated patient had a fall this morning. He states the patient broke her arm and maybe a rib. He also states she was having dizzy spells and was wondering if he could bring her in the office. Advised the patient's son to take the patient immediately to the ER for evaluation and treatment. He also stated the patient had a fall yesterday that he just found out about.

## 2019-12-30 ENCOUNTER — Inpatient Hospital Stay (HOSPITAL_COMMUNITY): Payer: Medicare PPO

## 2019-12-30 ENCOUNTER — Encounter: Payer: Medicare Other | Admitting: Family Medicine

## 2019-12-30 DIAGNOSIS — R42 Dizziness and giddiness: Secondary | ICD-10-CM

## 2019-12-30 DIAGNOSIS — J9601 Acute respiratory failure with hypoxia: Secondary | ICD-10-CM

## 2019-12-30 DIAGNOSIS — R296 Repeated falls: Secondary | ICD-10-CM

## 2019-12-30 LAB — BASIC METABOLIC PANEL
Anion gap: 8 (ref 5–15)
BUN: 18 mg/dL (ref 8–23)
CO2: 24 mmol/L (ref 22–32)
Calcium: 8.4 mg/dL — ABNORMAL LOW (ref 8.9–10.3)
Chloride: 108 mmol/L (ref 98–111)
Creatinine, Ser: 1.03 mg/dL — ABNORMAL HIGH (ref 0.44–1.00)
GFR calc Af Amer: 54 mL/min — ABNORMAL LOW (ref >60)
GFR calc non Af Amer: 46 mL/min — ABNORMAL LOW (ref >60)
Glucose, Bld: 105 mg/dL — ABNORMAL HIGH (ref 70–99)
Potassium: 4 mmol/L (ref 3.5–5.1)
Sodium: 140 mmol/L (ref 135–145)

## 2019-12-30 LAB — CBC WITH DIFFERENTIAL/PLATELET
Abs Immature Granulocytes: 0.05 10*3/uL (ref 0.00–0.07)
Basophils Absolute: 0 10*3/uL (ref 0.0–0.1)
Basophils Relative: 0 %
Eosinophils Absolute: 0.2 10*3/uL (ref 0.0–0.5)
Eosinophils Relative: 1 %
HCT: 35.6 % — ABNORMAL LOW (ref 36.0–46.0)
Hemoglobin: 11.2 g/dL — ABNORMAL LOW (ref 12.0–15.0)
Immature Granulocytes: 1 %
Lymphocytes Relative: 17 %
Lymphs Abs: 1.9 10*3/uL (ref 0.7–4.0)
MCH: 30.4 pg (ref 26.0–34.0)
MCHC: 31.5 g/dL (ref 30.0–36.0)
MCV: 96.5 fL (ref 80.0–100.0)
Monocytes Absolute: 1.3 10*3/uL — ABNORMAL HIGH (ref 0.1–1.0)
Monocytes Relative: 11 %
Neutro Abs: 7.7 10*3/uL (ref 1.7–7.7)
Neutrophils Relative %: 70 %
Platelets: 231 10*3/uL (ref 150–400)
RBC: 3.69 MIL/uL — ABNORMAL LOW (ref 3.87–5.11)
RDW: 15.9 % — ABNORMAL HIGH (ref 11.5–15.5)
WBC: 11.1 10*3/uL — ABNORMAL HIGH (ref 4.0–10.5)
nRBC: 0 % (ref 0.0–0.2)

## 2019-12-30 LAB — ECHOCARDIOGRAM COMPLETE
Height: 66 in
Weight: 2704 oz

## 2019-12-30 NOTE — Plan of Care (Signed)
  Problem: Pain Managment: Goal: General experience of comfort will improve Outcome: Progressing   Problem: Activity: Goal: Activity intolerance will improve Outcome: Progressing   Problem: Education: Goal: Knowledge of disease and its progression will improve Outcome: Progressing

## 2019-12-30 NOTE — Progress Notes (Signed)
PROGRESS NOTE    [name]  [id number] DOB: 1922/12/03 DOA: 12/29/2019 PCP: [name], MD   Brief Narrative:  HPI On 12/29/2019 by Dr. Mitzi Hansen [name] is a 86 y.o. female with medical history significant for hypertension, hypothyroidism, polymyalgia rheumatica, and chronic kidney disease IIIa, presenting to [facility] for evaluation of severe left wrist pain after fall.  Patient reports that she had been experiencing upset stomach and diarrhea for a few days, had been lightheaded upon standing, had a fall without any significant appreciable injury yesterday, and then fell again this morning onto her left arm.  She became lightheaded after standing up which led to the fall, but she denies hitting her head or losing consciousness.  She was experiencing immediate and severe pain at the left wrist after the fall this morning.  She reports that the diarrhea seemed to resolve as of yesterday and she is no longer having any abdominal discomfort, nausea, or chills.  She has not been coughing or short of breath.  She denies chest pain or palpitations.  She has not had much pain at the left lateral chest wall.  She was treated with fentanyl and 500 cc bolus prior to arrival.  Interim history Admitted with AKI and distal left radius fracture.  Patient also noted to have left lateral rib fractures after fall.  Currently on pain control.  Pending orthopedic consultation and intervention. Assessment & Plan   Acute kidney injury superimposed on chronic kidney disease, stage IIIa -Patient presented with severe left wrist pain after a fall at home found to have a left radius and left lateral rib fractures as well as acute kidney -Creatinine on admission was 1.6 which is up from her baseline of 1 Creatinine has now improved down to 1.03 -Suspect secondary to dehydration from recent diarrhea episodes which have now resolved -Continue IV fluid -monitor BMP  Acute hypoxic respiratory  failure -Oxygen saturation dropped to 86-87%, patient placed on supplemental oxygen -suspect likely due to pain from rib fractures and patient is unable to deeply inspire -Continue incentive spirometry  -will obtain chest xray  Distal left radius fracture -Secondary to fall at home -Patient states that she was very dizzy.  Suspect this is likely secondary to dehydration from recent diarrhea. -X-ray acute oblique fracture of the distal radius at the proximal end of the plate and screws in place for fixation of remote healed fracture. -Currently in sling -Orthopedic surgery consulted and appreciate -Continue IV pain control  Left lateral rib fractures -Left lateral eighth, ninth rib fractures noted on x-ray, seventh left lateral rib possibly involved.  No pneumothorax or effusion noted -Continue pain control and incentive spirometry  PMR -Continue home dose of prednisone  Essential hypertension -Continue amlodipine, nadolol- with holding parameters  Hypothyroidism -Continue Synthroid  Dizziness -suspect due to dehydration from recent diarrheal illness -Will obtain echocardiogram to rule out any other causes -Obtain orthostatic vitals  DVT Prophylaxis  heparin  Code Status: Full  Family Communication: None at bedside  Disposition Plan: Admitted.   Consultants Orthopedic surgery  Procedures  None  Antibiotics   Anti-infectives (From admission, onward)   None      Subjective:   [name] seen and examined today.  Patient states she still feels dizzy.  She denies chest pain or shortness of breath, abdominal pain, nausea vomiting diarrhea or constipation at this time. Objective:   Vitals:   12/30/19 0531 12/30/19 [id number] 12/30/19 0634 12/30/19 0938  BP: 116/69   Marland Kitchen)  108/57  Pulse: 75   75  Resp: 15     Temp: 98.4 F (36.9 C)     TempSrc:      SpO2: (!) 86% (!) 87% 93% 92%  Weight:      Height:        Intake/Output Summary (Last 24 hours) at 12/30/2019  [id number] Last data filed at 12/30/2019 0600 Gross per 24 hour  Intake 1056.55 ml  Output --  Net 1056.55 ml   Filed Weights   12/29/19 0913 12/29/19 1814  Weight: 70.3 kg 76.7 kg    Exam  General: Well developed, well nourished, elderly, NAD, appears stated age  HEENT: NCAT, mucous membranes moist.   Cardiovascular: S1 S2 auscultated, RRR, no murmur  Respiratory: Clear to auscultation bilaterally   Abdomen: Soft, nontender, nondistended, + bowel sounds  Extremities: warm dry without cyanosis clubbing or edema. LUE in sling  Neuro: AAOx3, nonfocal  Psych: Appropriate mood and affect, pleasant    Data Reviewed: I have personally reviewed following labs and imaging studies  CBC: Recent Labs  Lab 12/29/19 0943 12/30/19 0448  WBC 15.1* 11.1*  NEUTROABS 11.9* 7.7  HGB 12.2 11.2*  HCT 38.3 35.6*  MCV 95.8 96.5  PLT 251 [id number]   Basic Metabolic Panel: Recent Labs  Lab 12/29/19 0943 12/30/19 0448  NA 139 140  K 3.7 4.0  CL 107 108  CO2 24 24  GLUCOSE 119* 105*  BUN 31* 18  CREATININE 1.64* 1.03*  CALCIUM 8.6* 8.4*   GFR: Estimated Creatinine Clearance: 35 mL/min (A) (by C-G formula based on SCr of 1.03 mg/dL (H)). Liver Function Tests: No results for input(s): AST, ALT, ALKPHOS, BILITOT, PROT, ALBUMIN in the last 168 hours. No results for input(s): LIPASE, AMYLASE in the last 168 hours. No results for input(s): AMMONIA in the last 168 hours. Coagulation Profile: No results for input(s): INR, PROTIME in the last 168 hours. Cardiac Enzymes: No results for input(s): CKTOTAL, CKMB, CKMBINDEX, TROPONINI in the last 168 hours. BNP (last 3 results) No results for input(s): PROBNP in the last 8760 hours. HbA1C: No results for input(s): HGBA1C in the last 72 hours. CBG: No results for input(s): GLUCAP in the last 168 hours. Lipid Profile: No results for input(s): CHOL, HDL, LDLCALC, TRIG, CHOLHDL, LDLDIRECT in the last 72 hours. Thyroid Function Tests: No results  for input(s): TSH, T4TOTAL, FREET4, T3FREE, THYROIDAB in the last 72 hours. Anemia Panel: No results for input(s): VITAMINB12, FOLATE, FERRITIN, TIBC, IRON, RETICCTPCT in the last 72 hours. Urine analysis:    Component Value Date/Time   COLORURINE YELLOW 12/29/2019 [place] 12/29/2019 1244   LABSPEC 1.020 12/29/2019 1244   PHURINE 5.5 12/29/2019 [place] 12/29/2019 1244   GLUCOSEU NEGATIVE 02/22/2016 0926   HGBUR NEGATIVE 12/29/2019 1244   BILIRUBINUR NEGATIVE 12/29/2019 1244   BILIRUBINUR negative 06/07/2019 1311   KETONESUR NEGATIVE 12/29/2019 1244   PROTEINUR NEGATIVE 12/29/2019 1244   UROBILINOGEN 0.2 06/07/2019 1311   UROBILINOGEN 0.2 02/22/2016 0926   NITRITE NEGATIVE 12/29/2019 1244   LEUKOCYTESUR NEGATIVE 12/29/2019 1244   Sepsis Labs: @LABRCNTIP (procalcitonin:4,lacticidven:4)  ) Recent Results (from the past 240 hour(s))  Respiratory Panel by RT PCR (Flu A&B, Covid) - Nasopharyngeal Swab     Status: None   Collection Time: 12/29/19 12:15 PM   Specimen: Nasopharyngeal Swab  Result Value Ref Range Status   SARS Coronavirus 2 by RT PCR NEGATIVE NEGATIVE Final    Comment: (NOTE) SARS-CoV-2 target nucleic acids  are NOT DETECTED. The SARS-CoV-2 RNA is generally detectable in upper respiratoy specimens during the acute phase of infection. The lowest concentration of SARS-CoV-2 viral copies this assay can detect is 131 copies/mL. A negative result does not preclude SARS-Cov-2 infection and should not be used as the sole basis for treatment or other patient management decisions. A negative result may occur with  improper specimen collection/handling, submission of specimen other than nasopharyngeal swab, presence of viral mutation(s) within the areas targeted by this assay, and inadequate number of viral copies (<131 copies/mL). A negative result must be combined with clinical observations, patient history, and epidemiological information.  The expected result is Negative. Fact Sheet for Patients:  PinkCheek.be Fact Sheet for Healthcare Providers:  GravelBags.it This test is not yet ap proved or cleared by the [place] FDA and  has been authorized for detection and/or diagnosis of SARS-CoV-2 by FDA under an Emergency Use Authorization (EUA). This EUA will remain  in effect (meaning this test can be used) for the duration of the COVID-19 declaration under Section 564(b)(1) of the Act, 21 U.S.C. section 360bbb-3(b)(1), unless the authorization is terminated or revoked sooner.    Influenza A by PCR NEGATIVE NEGATIVE Final   Influenza B by PCR NEGATIVE NEGATIVE Final    Comment: (NOTE) The Xpert Xpress SARS-CoV-2/FLU/RSV assay is intended as an aid in  the diagnosis of influenza from Nasopharyngeal swab specimens and  should not be used as a sole basis for treatment. Nasal washings and  aspirates are unacceptable for Xpert Xpress SARS-CoV-2/FLU/RSV  testing. Fact Sheet for Patients: PinkCheek.be Fact Sheet for Healthcare Providers: GravelBags.it This test is not yet approved or cleared by the [place] FDA and  has been authorized for detection and/or diagnosis of SARS-CoV-2 by  FDA under an Emergency Use Authorization (EUA). This EUA will remain  in effect (meaning this test can be used) for the duration of the  Covid-19 declaration under Section 564(b)(1) of the Act, 21  U.S.C. section 360bbb-3(b)(1), unless the authorization is  terminated or revoked. Performed at [facility], [address] [place], [place] [address]       Radiology Studies: DG Ribs Unilateral W/Chest Left  Result Date: 12/29/2019 CLINICAL DATA:  Fall, left lateral rib pain EXAM: LEFT RIBS AND CHEST - 3+ VIEW COMPARISON:  02/16/2018 FINDINGS: Fractures through the left lateral 8th and 9th ribs and possibly the  lateral 7th rib. Heart is mildly enlarged. Left base atelectasis or scarring. No pneumothorax. No effusions. Mild wedged shaped appearance of the T11 vertebral body, age indeterminate. IMPRESSION: Left lateral 8th and 9th rib fractures and possible lateral 7th rib fracture. Left base atelectasis or scarring. No effusion or pneumothorax. Wedge-shaped appearance of the T11 vertebral body compatible with mild compression deformity, age indeterminate. Electronically Signed   By: Rolm Baptise M.D.   On: 12/29/2019 11:05   DG Wrist Complete Left  Result Date: 12/29/2019 CLINICAL DATA:  Left wrist pain after a fall today. Initial encounter. EXAM: LEFT WRIST - COMPLETE 3+ VIEW COMPARISON:  Plain films left wrist 12/24/2009. FINDINGS: Volar plate and screws are in place for fixation of a distal radius fracture which was present on the 2011 examination. There is an acute oblique fracture of the radius at the proximal end of the plate with 1/2 shaft width lateral displacement of the distal fragment. Previously seen distal radius and ulnar fractures are healed. There is ulnar positive variance. Bones appear osteopenic. IMPRESSION: Acute oblique fracture of the distal  radius at the proximal end of plate and screws in place for fixation of a remote healed fracture. No other acute abnormality. Remote healed distal radius and ulnar fractures. Osteopenia. Electronically Signed   By: Inge Rise M.D.   On: 12/29/2019 11:08     Scheduled Meds: . amLODipine  10 mg Oral Daily  . gabapentin  600 mg Oral QHS  . heparin  5,000 Units Subcutaneous Q8H  . levothyroxine  50 mcg Oral Q0600  . nadolol  80 mg Oral Daily  . pantoprazole  40 mg Oral Daily  . predniSONE  10 mg Oral Q breakfast  . sodium chloride flush  3 mL Intravenous Q12H   Continuous Infusions: . sodium chloride 75 mL/hr at 12/29/19 2040     LOS: 1 day   Time Spent in minutes   45 minutes  Tryphena Perkovich D.O. on 12/30/2019 at 9:38 AM  Between 7am  to 7pm - Please see pager noted on [email or web address]  After 7pm go to [email or web address]  And look for the night coverage person covering for me after hours  [facility] Office  [phone]

## 2019-12-30 NOTE — Consult Note (Signed)
The patient's chart was reviewed. The patient's radiographs and notes were reviewed. The patient did have a distal radius fracture over 7 years ago for which she underwent open reduction and internal fixation. Unfortunately she sustained a another injury to the same wrist and broke the wrist at the proximal portion of the plate. The patient will require surgical stabilization and revision implantation of the hardware. My plan would be to do this Saturday morning at [facility]. The plan will be for revision open reduction internal fixation I will be by to see the patient later today to discuss the management and plan.

## 2019-12-30 NOTE — Consult Note (Signed)
Reason for Consult:Left wrist fx Referring Physician: [name] is an 86 y.o. female.  HPI: [name] was at home and got up to get out of the bath. She got dizzy and fell backwards. She had immediate severe left wrist pain. She came to the ED where x-rays showed a periprosthetic radius fx and hand surgery was consulted. She says she fell before this and broke her ribs then. She said that fall was the day before Christmas but then wasn't sure about that. She is a little confused this morning so history is a bit suspect.  Past Medical History:  Diagnosis Date  . DVT (deep venous thrombosis) ([place]) 09/2015   RLE  . GERD (gastroesophageal reflux disease)   . Hypertension   . Hypothyroidism   . Melanoma ([place]) 06/16/2017   Facial melanoma, removed by Dr. Harvel Quale  . Peripheral vascular disease ([place])    peripheral neuropathy    Past Surgical History:  Procedure Laterality Date  . ABDOMINAL HYSTERECTOMY    . APPENDECTOMY    . BACK SURGERY    . BREAST SURGERY     left breast biopsy  . EYE SURGERY     cataract  . LIPOMA EXCISION Left 04/25/2015   Procedure: EXCISION OF LIPOMA LEFT ARM;  Surgeon: Donnie Mesa, MD;  Location: [facility];  Service: General;  Laterality: Left;  . LUMBAR LAMINECTOMY/DECOMPRESSION MICRODISCECTOMY  11/20/2011   Procedure: LUMBAR LAMINECTOMY/DECOMPRESSION MICRODISCECTOMY;  Surgeon: Jeneen Rinks P [place];  Location: [facility];  Service: Orthopedics;  Laterality: N/A;  Decompressive Laminectomy L2 to the Sacrum (X-Ray)  . OTHER SURGICAL HISTORY     left wrist surgery - has plate in left wrist   . OTHER SURGICAL HISTORY     right knee surgery due to torn cartilage   . TONSILLECTOMY    . WRIST FRACTURE SURGERY Left     Family History  Problem Relation Age of Onset  . Cancer Mother        BREAST  . COPD Father   . Emphysema Father   . Cancer Daughter        breast    Social History:  reports that she has never smoked. She has never used  smokeless tobacco. She reports that she does not drink alcohol or use drugs.  Allergies:  Allergies  Allergen Reactions  . Codeine Other (See Comments)    Nightmares, imagining things  . Keflex [Cephalexin] Nausea Only    Pt ended up in ER w/ CP    Medications: I have reviewed the patient's current medications.  Results for orders placed or performed during the hospital encounter of 12/29/19 (from the past 48 hour(s))  CBC with Differential     Status: Abnormal   Collection Time: 12/29/19  9:43 AM  Result Value Ref Range   WBC 15.1 (H) 4.0 - 10.5 K/uL   RBC 4.00 3.87 - 5.11 MIL/uL   Hemoglobin 12.2 12.0 - 15.0 g/dL   HCT 38.3 36.0 - 46.0 %   MCV 95.8 80.0 - 100.0 fL   MCH 30.5 26.0 - 34.0 pg   MCHC 31.9 30.0 - 36.0 g/dL   RDW 15.9 (H) 11.5 - 15.5 %   Platelets 251 150 - 400 K/uL   nRBC 0.0 0.0 - 0.2 %   Neutrophils Relative % 78 %   Neutro Abs 11.9 (H) 1.7 - 7.7 K/uL   Lymphocytes Relative 9 %   Lymphs Abs 1.4 0.7 - 4.0 K/uL   Monocytes  Relative 11 %   Monocytes Absolute 1.7 (H) 0.1 - 1.0 K/uL   Eosinophils Relative 1 %   Eosinophils Absolute 0.1 0.0 - 0.5 K/uL   Basophils Relative 0 %   Basophils Absolute 0.0 0.0 - 0.1 K/uL   Immature Granulocytes 1 %   Abs Immature Granulocytes 0.09 (H) 0.00 - 0.07 K/uL    Comment: Performed at [facility], [address] [place], [place] [id number]  Basic metabolic panel     Status: Abnormal   Collection Time: 12/29/19  9:43 AM  Result Value Ref Range   Sodium 139 135 - 145 mmol/L   Potassium 3.7 3.5 - 5.1 mmol/L   Chloride 107 98 - 111 mmol/L   CO2 24 22 - 32 mmol/L   Glucose, Bld 119 (H) 70 - 99 mg/dL   BUN 31 (H) 8 - 23 mg/dL   Creatinine, Ser 1.64 (H) 0.44 - 1.00 mg/dL   Calcium 8.6 (L) 8.9 - 10.3 mg/dL   GFR calc non Af Amer 26 (L) >60 mL/min   GFR calc Af Amer 31 (L) >60 mL/min   Anion gap 8 5 - 15    Comment: Performed at [facility], [address] [place], [place] [address]  Respiratory Panel  by RT PCR (Flu A&B, Covid) - Nasopharyngeal Swab     Status: None   Collection Time: 12/29/19 12:15 PM   Specimen: Nasopharyngeal Swab  Result Value Ref Range   SARS Coronavirus 2 by RT PCR NEGATIVE NEGATIVE    Comment: (NOTE) SARS-CoV-2 target nucleic acids are NOT DETECTED. The SARS-CoV-2 RNA is generally detectable in upper respiratoy specimens during the acute phase of infection. The lowest concentration of SARS-CoV-2 viral copies this assay can detect is 131 copies/mL. A negative result does not preclude SARS-Cov-2 infection and should not be used as the sole basis for treatment or other patient management decisions. A negative result may occur with  improper specimen collection/handling, submission of specimen other than nasopharyngeal swab, presence of viral mutation(s) within the areas targeted by this assay, and inadequate number of viral copies (<131 copies/mL). A negative result must be combined with clinical observations, patient history, and epidemiological information. The expected result is Negative. Fact Sheet for Patients:  PinkCheek.be Fact Sheet for Healthcare Providers:  GravelBags.it This test is not yet ap proved or cleared by the [place] FDA and  has been authorized for detection and/or diagnosis of SARS-CoV-2 by FDA under an Emergency Use Authorization (EUA). This EUA will remain  in effect (meaning this test can be used) for the duration of the COVID-19 declaration under Section 564(b)(1) of the Act, 21 U.S.C. section 360bbb-3(b)(1), unless the authorization is terminated or revoked sooner.    Influenza A by PCR NEGATIVE NEGATIVE   Influenza B by PCR NEGATIVE NEGATIVE    Comment: (NOTE) The Xpert Xpress SARS-CoV-2/FLU/RSV assay is intended as an aid in  the diagnosis of influenza from Nasopharyngeal swab specimens and  should not be used as a sole basis for treatment. Nasal washings and   aspirates are unacceptable for Xpert Xpress SARS-CoV-2/FLU/RSV  testing. Fact Sheet for Patients: PinkCheek.be Fact Sheet for Healthcare Providers: GravelBags.it This test is not yet approved or cleared by the [place] FDA and  has been authorized for detection and/or diagnosis of SARS-CoV-2 by  FDA under an Emergency Use Authorization (EUA). This EUA will remain  in effect (meaning this test can be used) for the duration of the  Covid-19 declaration under Section 564(b)(1) of the Act, 21  U.S.C. section 360bbb-3(b)(1), unless the authorization is  terminated or revoked. Performed at [facility], [address] [place], [place] [address]   Urinalysis, Routine w reflex microscopic     Status: None   Collection Time: 12/29/19 12:44 PM  Result Value Ref Range   Color, Urine YELLOW YELLOW   APPearance CLEAR CLEAR   Specific Gravity, Urine 1.020 1.005 - 1.030   pH 5.5 5.0 - 8.0   Glucose, UA NEGATIVE NEGATIVE mg/dL   Hgb urine dipstick NEGATIVE NEGATIVE   Bilirubin Urine NEGATIVE NEGATIVE   Ketones, ur NEGATIVE NEGATIVE mg/dL   Protein, ur NEGATIVE NEGATIVE mg/dL   Nitrite NEGATIVE NEGATIVE   Leukocytes,Ua NEGATIVE NEGATIVE    Comment: Microscopic not done on urines with negative protein, blood, leukocytes, nitrite, or glucose < 500 mg/dL. Performed at [facility], [address] [place], [place] [id number]   Basic metabolic panel     Status: Abnormal   Collection Time: 12/30/19  4:48 AM  Result Value Ref Range   Sodium 140 135 - 145 mmol/L   Potassium 4.0 3.5 - 5.1 mmol/L   Chloride 108 98 - 111 mmol/L   CO2 24 22 - 32 mmol/L   Glucose, Bld 105 (H) 70 - 99 mg/dL   BUN 18 8 - 23 mg/dL   Creatinine, Ser 1.03 (H) 0.44 - 1.00 mg/dL   Calcium 8.4 (L) 8.9 - 10.3 mg/dL   GFR calc non Af Amer 46 (L) >60 mL/min   GFR calc Af Amer 54 (L) >60 mL/min   Anion gap 8 5 - 15    Comment: Performed at  [facility], [place] [address], [place], [place] [address]  CBC WITH DIFFERENTIAL     Status: Abnormal   Collection Time: 12/30/19  4:48 AM  Result Value Ref Range   WBC 11.1 (H) 4.0 - 10.5 K/uL   RBC 3.69 (L) 3.87 - 5.11 MIL/uL   Hemoglobin 11.2 (L) 12.0 - 15.0 g/dL   HCT 35.6 (L) 36.0 - 46.0 %   MCV 96.5 80.0 - 100.0 fL   MCH 30.4 26.0 - 34.0 pg   MCHC 31.5 30.0 - 36.0 g/dL   RDW 15.9 (H) 11.5 - 15.5 %   Platelets 231 150 - 400 K/uL   nRBC 0.0 0.0 - 0.2 %   Neutrophils Relative % 70 %   Neutro Abs 7.7 1.7 - 7.7 K/uL   Lymphocytes Relative 17 %   Lymphs Abs 1.9 0.7 - 4.0 K/uL   Monocytes Relative 11 %   Monocytes Absolute 1.3 (H) 0.1 - 1.0 K/uL   Eosinophils Relative 1 %   Eosinophils Absolute 0.2 0.0 - 0.5 K/uL   Basophils Relative 0 %   Basophils Absolute 0.0 0.0 - 0.1 K/uL   Immature Granulocytes 1 %   Abs Immature Granulocytes 0.05 0.00 - 0.07 K/uL    Comment: Performed at [facility], [address], [place], [place] [address]    DG Ribs Unilateral W/Chest Left  Result Date: 12/29/2019 CLINICAL DATA:  Fall, left lateral rib pain EXAM: LEFT RIBS AND CHEST - 3+ VIEW COMPARISON:  02/16/2018 FINDINGS: Fractures through the left lateral 8th and 9th ribs and possibly the lateral 7th rib. Heart is mildly enlarged. Left base atelectasis or scarring. No pneumothorax. No effusions. Mild wedged shaped appearance of the T11 vertebral body, age indeterminate. IMPRESSION: Left lateral 8th and 9th  rib fractures and possible lateral 7th rib fracture. Left base atelectasis or scarring. No effusion or pneumothorax. Wedge-shaped appearance of the T11 vertebral body compatible with mild compression deformity, age indeterminate. Electronically Signed   By: Rolm Baptise M.D.   On: 12/29/2019 11:05   DG Wrist Complete Left  Result Date: 12/29/2019 CLINICAL DATA:  Left wrist pain after a fall today. Initial encounter. EXAM: LEFT WRIST - COMPLETE 3+ VIEW COMPARISON:  Plain films left wrist  12/24/2009. FINDINGS: Volar plate and screws are in place for fixation of a distal radius fracture which was present on the 2011 examination. There is an acute oblique fracture of the radius at the proximal end of the plate with 1/2 shaft width lateral displacement of the distal fragment. Previously seen distal radius and ulnar fractures are healed. There is ulnar positive variance. Bones appear osteopenic. IMPRESSION: Acute oblique fracture of the distal radius at the proximal end of plate and screws in place for fixation of a remote healed fracture. No other acute abnormality. Remote healed distal radius and ulnar fractures. Osteopenia. Electronically Signed   By: Inge Rise M.D.   On: 12/29/2019 11:08    Review of Systems  HENT: Negative for ear discharge, ear pain, hearing loss and tinnitus.   Eyes: Negative for photophobia and pain.  Respiratory: Negative for cough and shortness of breath.   Cardiovascular: Positive for chest pain.  Gastrointestinal: Negative for abdominal pain, nausea and vomiting.  Genitourinary: Negative for dysuria, flank pain, frequency and urgency.  Musculoskeletal: Positive for arthralgias (Left wrist). Negative for back pain, myalgias and neck pain.  Neurological: Negative for dizziness and headaches.  Hematological: Does not bruise/bleed easily.  Psychiatric/Behavioral: The patient is not nervous/anxious.    Blood pressure (!) 93/52, pulse 78, temperature 98.4 F (36.9 C), resp. rate 15, height 5\' 6"  (1.676 m), weight 76.7 kg, SpO2 93 %. Physical Exam  Constitutional: She appears well-developed and well-nourished. No distress.  HENT:  Head: Normocephalic and atraumatic.  Eyes: Conjunctivae are normal. Right eye exhibits no discharge. Left eye exhibits no discharge. No scleral icterus.  Cardiovascular: Normal rate and regular rhythm.  Respiratory: Effort normal. No respiratory distress.  Musculoskeletal:     Cervical back: Normal range of motion.      Comments: UEx shoulder, elbow, wrist, digits- no skin wounds, mod wrist TTP, no instability, no blocks to motion  Sens  Ax/R/M/U intact  Mot   Ax/ R/ PIN/ M/ AIN/ U grossly intact  Rad 2+  Neurological: She is alert.  Skin: Skin is warm and dry. She is not diaphoretic.  Psychiatric: She has a normal mood and affect. Her behavior is normal.    Assessment/Plan: Left radius fx -- Plan on ORIF Saturday with Dr. Caralyn Guile. Please make NPO Friday night. Multiple medical problems including hypertension, hypothyroidism, polymyalgia rheumatica, and chronic kidney disease IIIa -- per primary service    Lisette Abu, PA-C Orthopedic Surgery [phone] 12/30/2019, 9:54 AM

## 2019-12-30 NOTE — Progress Notes (Signed)
Notified by NT Raquel pt. O2 saturation is 86% on room air. Rechecked pt. O2 sat is 87% on room air. Pt. placed on 2L [place], 49mins after rechecked O2 sat its 93%. Will continue to monitor pt.

## 2019-12-30 NOTE — Progress Notes (Signed)
  Echocardiogram 2D Echocardiogram has been performed.  [name] 12/30/2019, 12:41 PM

## 2019-12-30 NOTE — Progress Notes (Signed)
Orthopedic Tech Progress Note Patient Details:  [name] 12-Feb-1923 [id number] Had xray hold hand while I applied splint. Patient did a lot of moving while applying splint Ortho Devices Type of Ortho Device: Sugartong splint Ortho Device/Splint Location: LUE Ortho Device/Splint Interventions: Ordered, Application   Post Interventions Patient Tolerated: Well Instructions Provided: Care of device, Adjustment of device   Janit Pagan 12/30/2019, 11:21 AM

## 2019-12-31 DIAGNOSIS — Z01810 Encounter for preprocedural cardiovascular examination: Secondary | ICD-10-CM

## 2019-12-31 DIAGNOSIS — R55 Syncope and collapse: Secondary | ICD-10-CM

## 2019-12-31 LAB — BASIC METABOLIC PANEL
Anion gap: 8 (ref 5–15)
BUN: 12 mg/dL (ref 8–23)
CO2: 27 mmol/L (ref 22–32)
Calcium: 9 mg/dL (ref 8.9–10.3)
Chloride: 106 mmol/L (ref 98–111)
Creatinine, Ser: 0.86 mg/dL (ref 0.44–1.00)
GFR calc Af Amer: >60 mL/min (ref >60)
GFR calc non Af Amer: 58 mL/min — ABNORMAL LOW (ref >60)
Glucose, Bld: 125 mg/dL — ABNORMAL HIGH (ref 70–99)
Potassium: 4.2 mmol/L (ref 3.5–5.1)
Sodium: 141 mmol/L (ref 135–145)

## 2019-12-31 LAB — CBC
HCT: 35.9 % — ABNORMAL LOW (ref 36.0–46.0)
Hemoglobin: 11.3 g/dL — ABNORMAL LOW (ref 12.0–15.0)
MCH: 30.5 pg (ref 26.0–34.0)
MCHC: 31.5 g/dL (ref 30.0–36.0)
MCV: 97 fL (ref 80.0–100.0)
Platelets: 233 10*3/uL (ref 150–400)
RBC: 3.7 MIL/uL — ABNORMAL LOW (ref 3.87–5.11)
RDW: 15.8 % — ABNORMAL HIGH (ref 11.5–15.5)
WBC: 10.9 10*3/uL — ABNORMAL HIGH (ref 4.0–10.5)
nRBC: 0 % (ref 0.0–0.2)

## 2019-12-31 LAB — SURGICAL PCR SCREEN
MRSA, PCR: NEGATIVE
Staphylococcus aureus: NEGATIVE

## 2019-12-31 MED ORDER — CEFAZOLIN SODIUM-DEXTROSE 2-4 GM/100ML-% IV SOLN
2.0000 g | INTRAVENOUS | Status: DC
  Filled 2019-12-31: qty 100

## 2019-12-31 MED ORDER — VANCOMYCIN HCL IN DEXTROSE 1-5 GM/200ML-% IV SOLN
1000.0000 mg | INTRAVENOUS | Status: AC
  Administered 2020-01-01: 1000 mg via INTRAVENOUS
  Filled 2019-12-31 (×2): qty 200

## 2019-12-31 NOTE — Progress Notes (Signed)
PLAN FOR REVISION OPEN REDUCTION AND INTERNAL FIXATION IN AM PREOP ORDERS IN [facility] TO PROCEED IN EARLY AM WITH SURGERY WOULD BE ABLE FROM WRIST PERSPECTIVE TO GO HOME ON Sunday

## 2019-12-31 NOTE — Consult Note (Addendum)
Cardiology Consultation:   Patient ID: [name] MRN: [id number]; DOB: 1923/10/29  Admit date: 12/29/2019 Date of Consult: 12/31/2019  Primary Care Provider: [name], MD Primary Cardiologist: No primary care provider on file.  Primary Electrophysiologist:  None   Patient Profile:   [name] is a 86 y.o. female with a hx of hypertension, hypothyroidism, polymyalgia rheumatica and CKD stage III who is being seen today for preoperative evaluation at the request of Dr. Ree Kida.  History of Present Illness:   [name] has a past medical history as above.  She has no prior cardiac history.  She is a non-smoker and does not drink alcohol.  She appears to be much younger than her stated age.  She lives alone and is independent.  She does some light housework, cooking, still drives and does her shopping.  Her son lives right next door and he and her son-in-law check in on her frequently.  She also has a granddaughter who lives 1/4 mile away.  She had a mechanical fall in December when she tripped over her open dishwasher door.  She sustained some injury to her left hip tendons and is getting physical therapy twice a week.  She also tries to exercise by walking up and down her driveway and working around the house.  She denies any exertional chest discomfort or shortness of breath.  She also denies orthopnea, PND, palpitations, peripheral edema.  The patient presented to the hospital after a fall.  Reportedly she had been having an upset stomach and diarrhea for several days and developed lightheadedness upon standing.  She had a fall yesterday with no significant injury and fell again this morning onto her left arm.  She had no associated chest discomfort, palpitations or shortness of breath.  She was found to have a distal left radius fracture as well as left lateral rib fractures.  She was also found to have acute kidney injury. She has been treated with pain medications and a  500 cc IV bolus.    She is planned to have an open reduction internal fixation of the left wrist for tomorrow.  On my interview with the patient she tells me that on Sunday into Monday she had frequent loose stools about every 30 minutes and became very weak.  Her first fall occurred after getting out of the shower when she she became dizzy and fell and hurt her ribs.  She does not recall any chest discomfort or palpitations prior to the fall.  She had another fall during which she fractured her left arm.  Again she became dizzy after walking into the bathroom and went down.  She does not think she actually lost consciousness.  She notes that in the past she has had some lightheadedness if she gets up too fast first thing in the morning, but this has not bothered her much and she has not had any prior related falls.  Heart Pathway Score:     Past Medical History:  Diagnosis Date  . DVT (deep venous thrombosis) ([place]) 09/2015   RLE  . GERD (gastroesophageal reflux disease)   . Hypertension   . Hypothyroidism   . 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 Peripheral vascular disease ([place])    peripheral neuropathy    Past Surgical History:  Procedure Laterality Date  . ABDOMINAL HYSTERECTOMY    . APPENDECTOMY    . BACK SURGERY    . BREAST SURGERY  left breast biopsy  . EYE SURGERY     cataract  . LIPOMA EXCISION Left 04/25/2015   Procedure: EXCISION OF LIPOMA LEFT ARM;  Surgeon: Donnie Mesa, MD;  Location: [facility];  Service: General;  Laterality: Left;  . LUMBAR LAMINECTOMY/DECOMPRESSION MICRODISCECTOMY  11/20/2011   Procedure: LUMBAR LAMINECTOMY/DECOMPRESSION MICRODISCECTOMY;  Surgeon: Jeneen Rinks P [place];  Location: [facility];  Service: Orthopedics;  Laterality: N/A;  Decompressive Laminectomy L2 to the Sacrum (X-Ray)  . OTHER SURGICAL HISTORY     left wrist surgery - has plate in left wrist   . OTHER SURGICAL HISTORY     right knee surgery  due to torn cartilage   . TONSILLECTOMY    . WRIST FRACTURE SURGERY Left      Home Medications:  Prior to Admission medications   Medication Sig Start Date End Date Taking? Authorizing Provider  amLODipine (NORVASC) 10 MG tablet TAKE 1 TABLET BY MOUTH EVERY DAY Patient taking differently: Take 10 mg by mouth daily.  09/09/19  Yes [name] Minium, MD  gabapentin (NEURONTIN) 300 MG capsule TAKE 2 CAPSULES BY MOUTH AT BEDTIME Patient taking differently: Take 600 mg by mouth at bedtime.  09/13/19  Yes [name] Minium, MD  Ibuprofen-Acetaminophen (ADVIL DUAL ACTION) 125-250 MG TABS Take 2 tablets by mouth as needed (pain).   Yes [provider]  levothyroxine (SYNTHROID) 50 MCG tablet TAKE 1 TABLET BY MOUTH EVERY DAY Patient taking differently: Take 50 mcg by mouth daily.  08/11/19  Yes [name] Minium, MD  Multiple Vitamins-Minerals (PRESERVISION AREDS 2) CAPS Take 1 capsule by mouth daily.    Yes [provider]  nadolol (CORGARD) 80 MG tablet TAKE 1 TABLET BY MOUTH EVERY DAY Patient taking differently: Take 80 mg by mouth daily.  09/13/19  Yes [name] Minium, MD  pantoprazole (PROTONIX) 40 MG tablet TAKE 1 TABLET BY MOUTH EVERY DAY Patient taking differently: Take 40 mg by mouth daily.  11/23/19  Yes [name] Minium, MD  predniSONE (DELTASONE) 5 MG tablet Take 10 mg by mouth daily with breakfast.    Yes [provider]  celecoxib (CELEBREX) 100 MG capsule TAKE 1 CAPSULE BY MOUTH TWICE A DAY Patient not taking: Reported on 12/29/2019 01/16/18   [name] Minium, MD    Inpatient Medications: Scheduled Meds: . amLODipine  10 mg Oral Daily  . gabapentin  600 mg Oral QHS  . heparin  5,000 Units Subcutaneous Q8H  . levothyroxine  50 mcg Oral Q0600  . nadolol  80 mg Oral Daily  . pantoprazole  40 mg Oral Daily  . predniSONE  10 mg Oral Q breakfast  . sodium chloride flush  3 mL Intravenous Q12H   Continuous Infusions:  PRN Meds: acetaminophen  **OR** acetaminophen, fentaNYL (SUBLIMAZE) injection, ondansetron (ZOFRAN) IV, oxyCODONE-acetaminophen  Allergies:    Allergies  Allergen Reactions  . Codeine Other (See Comments)    Nightmares, imagining things  . Keflex [Cephalexin] Nausea Only    Pt ended up in ER w/ CP    Social History:   Social History   Socioeconomic History  . Marital status: Widowed    Spouse name: Not on file  . Number of children: Not on file  . Years of education: Not on file  . Highest education level: Not on file  Occupational History  . Not on file  Tobacco Use  . Smoking status: Never Smoker  . Smokeless tobacco: Never Used  Substance and Sexual Activity  . Alcohol  use: No  . Drug use: No  . Sexual activity: Never  Other Topics Concern  . Not on file  Social History Narrative  . Not on file   Social Determinants of Health   Financial Resource Strain:   . Difficulty of Paying Living Expenses: Not on file  Food Insecurity:   . Worried About Charity fundraiser in the Last Year: Not on file  . Ran Out of Food in the Last Year: Not on file  Transportation Needs:   . Lack of Transportation (Medical): Not on file  . Lack of Transportation (Non-Medical): Not on file  Physical Activity:   . Days of Exercise per Week: Not on file  . Minutes of Exercise per Session: Not on file  Stress:   . Feeling of Stress : Not on file  Social Connections:   . Frequency of Communication with Friends and Family: Not on file  . Frequency of Social Gatherings with Friends and Family: Not on file  . Attends Religious Services: Not on file  . Active Member of Clubs or Organizations: Not on file  . Attends Archivist Meetings: Not on file  . Marital Status: Not on file  Intimate Partner Violence:   . Fear of Current or Ex-Partner: Not on file  . Emotionally Abused: Not on file  . Physically Abused: Not on file  . Sexually Abused: Not on file    Family History:    Family History  Problem  Relation Age of Onset  . Cancer Mother        BREAST  . COPD Father   . Emphysema Father   . Cancer Daughter        breast     ROS:  Please see the history of present illness.   All other ROS reviewed and negative.     Physical Exam/Data:   Vitals:   12/30/19 1708 12/30/19 2027 12/31/19 0434 12/31/19 0924  BP: (!) 143/61 128/64 130/62 128/60  Pulse: 67 66 67 63  Resp: 18 16 15 18   Temp: 98.5 F (36.9 C) 97.8 F (36.6 C) 98.2 F (36.8 C) 98.2 F (36.8 C)  TempSrc: Oral Oral Oral Oral  SpO2: 92% 92% 93% 91%  Weight:  77.1 kg    Height:        Intake/Output Summary (Last 24 hours) at 12/31/2019 1032 Last data filed at 12/31/2019 0900 Gross per 24 hour  Intake 900 ml  Output 0 ml  Net 900 ml   Last 3 Weights 12/30/2019 12/29/2019 12/29/2019  Weight (lbs) 170 lb 169 lb 155 lb  Weight (kg) 77.111 kg 76.658 kg 70.308 kg     Body mass index is 27.44 kg/m.  General:  Well nourished, well developed, in no acute distress HEENT: normal Lymph: no adenopathy Neck: no JVD Endocrine:  No thryomegaly Vascular: No carotid bruits; pedal pulses 2+ bilaterally Cardiac:  normal S1, S2; RRR; no murmur  Lungs:  clear to auscultation bilaterally, no wheezing, rhonchi or rales  Abd: soft, nontender, no hepatomegaly  Ext: no edema Musculoskeletal:  No deformities, BUE and BLE strength normal and equal Skin: warm and dry  Neuro:  CNs 2-12 intact, no focal abnormalities noted Psych:  Normal affect   EKG:  The EKG was personally reviewed and demonstrates:  Sinus bradycardia with Left axis deviation, 58 bpm, QTC 432 Telemetry:  Telemetry was personally reviewed and demonstrates: Sinus rhythm in the 60s.  She had a few wide-complex beats around 730 this  morning with rate in the 90s.  Question PVCs versus some type of a abberancy.   Relevant CV Studies:  Echocardiogram 12/30/2019  IMPRESSIONS  1. Left ventricular ejection fraction, by estimation, is 50%. The left  ventricle has mildly  decreased function. The left ventricle demonstrates  regional wall motion abnormalities. Apical septal and apical anterior  hypokinesis noted. Left ventricular  diastolic parameters are consistent with Grade I diastolic dysfunction  (impaired relaxation).  2. Right ventricular systolic function is normal. The right ventricular  size is normal. Tricuspid regurgitation signal is inadequate for assessing  PA pressure.  3. The mitral valve is normal in structure and function. Trivial mitral  valve regurgitation. No evidence of mitral stenosis.  4. The aortic valve is tricuspid. Aortic valve regurgitation is not  visualized. Mild aortic valve sclerosis is present, with no evidence of  aortic valve stenosis.  5. The inferior vena cava is normal in size with greater than 50%  respiratory variability, suggesting right atrial pressure of 3 mmHg.   Laboratory Data:  High Sensitivity Troponin:  No results for input(s): TROPONINIHS in the last 720 hours.   Chemistry Recent Labs  Lab 12/29/19 0943 12/30/19 0448 12/31/19 0415  NA 139 140 141  K 3.7 4.0 4.2  CL 107 108 106  CO2 24 24 27   GLUCOSE 119* 105* 125*  BUN 31* 18 12  CREATININE 1.64* 1.03* 0.86  CALCIUM 8.6* 8.4* 9.0  GFRNONAA 26* 46* 58*  GFRAA 31* 54* >60  ANIONGAP 8 8 8     No results for input(s): PROT, ALBUMIN, AST, ALT, ALKPHOS, BILITOT in the last 168 hours. Hematology Recent Labs  Lab 12/29/19 0943 12/30/19 0448 12/31/19 0415  WBC 15.1* 11.1* 10.9*  RBC 4.00 3.69* 3.70*  HGB 12.2 11.2* 11.3*  HCT 38.3 35.6* 35.9*  MCV 95.8 96.5 97.0  MCH 30.5 30.4 30.5  MCHC 31.9 31.5 31.5  RDW 15.9* 15.9* 15.8*  PLT 251 231 233   BNPNo results for input(s): BNP, PROBNP in the last 168 hours.  DDimer No results for input(s): DDIMER in the last 168 hours.   Radiology/Studies:  DG Ribs Unilateral W/Chest Left  Result Date: 12/29/2019 CLINICAL DATA:  Fall, left lateral rib pain EXAM: LEFT RIBS AND CHEST - 3+ VIEW  COMPARISON:  02/16/2018 FINDINGS: Fractures through the left lateral 8th and 9th ribs and possibly the lateral 7th rib. Heart is mildly enlarged. Left base atelectasis or scarring. No pneumothorax. No effusions. Mild wedged shaped appearance of the T11 vertebral body, age indeterminate. IMPRESSION: Left lateral 8th and 9th rib fractures and possible lateral 7th rib fracture. Left base atelectasis or scarring. No effusion or pneumothorax. Wedge-shaped appearance of the T11 vertebral body compatible with mild compression deformity, age indeterminate. Electronically Signed   By: Rolm Baptise M.D.   On: 12/29/2019 11:05   DG Wrist Complete Left  Result Date: 12/29/2019 CLINICAL DATA:  Left wrist pain after a fall today. Initial encounter. EXAM: LEFT WRIST - COMPLETE 3+ VIEW COMPARISON:  Plain films left wrist 12/24/2009. FINDINGS: Volar plate and screws are in place for fixation of a distal radius fracture which was present on the 2011 examination. There is an acute oblique fracture of the radius at the proximal end of the plate with 1/2 shaft width lateral displacement of the distal fragment. Previously seen distal radius and ulnar fractures are healed. There is ulnar positive variance. Bones appear osteopenic. IMPRESSION: Acute oblique fracture of the distal radius at the proximal end of plate and  screws in place for fixation of a remote healed fracture. No other acute abnormality. Remote healed distal radius and ulnar fractures. Osteopenia. Electronically Signed   By: Inge Rise M.D.   On: 12/29/2019 11:08   DG CHEST PORT 1 VIEW  Result Date: 12/30/2019 CLINICAL DATA:  86 year old with hypoxia. Acute LEFT rib fractures due to a fall yesterday. EXAM: PORTABLE CHEST 1 VIEW COMPARISON:  12/29/2019 and earlier. FINDINGS: Cardiac silhouette mildly enlarged for AP portable technique, unchanged. Worsening atelectasis at the LEFT lung base since yesterday's examination. Lungs remain clear otherwise. Normal  pulmonary vascularity. Mildly displaced LEFT posterolateral seventh, eighth and ninth rib fractures again noted. 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 IMPRESSION: 1. Worsening atelectasis at the LEFT lung base. No acute cardiopulmonary disease otherwise. 2. Stable mild cardiomegaly without pulmonary edema. Electronically Signed   By: Evangeline Dakin M.D.   On: 12/30/2019 11:29   ECHOCARDIOGRAM COMPLETE  Result Date: 12/30/2019    ECHOCARDIOGRAM REPORT   Patient Name:   [name] Date of Exam: 12/30/2019 Medical Rec #:  [id number]       Height:       66.0 in Accession #:    [id number]      Weight:       169.0 lb Date of Birth:  Aug 25, 1923        BSA:          1.86 m Patient Age:    76 years        BP:           116/69 mmHg Patient Gender: F               HR:           71 bpm. Exam Location:  Inpatient Procedure: 2D Echo, Cardiac Doppler and Color Doppler Indications:    Dizziness [id number]  History:        Patient has no prior history of Echocardiogram examinations.                 Risk Factors:Hypertension. GERD. DVT. Hypothyroidism.  Sonographer:    Tiffany Dance Referring Phys: [id number] [place]  1. Left ventricular ejection fraction, by estimation, is 50%. The left ventricle has mildly decreased function. The left ventricle demonstrates regional wall motion abnormalities. Apical septal and apical anterior hypokinesis noted. Left ventricular diastolic parameters are consistent with Grade I diastolic dysfunction (impaired relaxation).  2. Right ventricular systolic function is normal. The right ventricular size is normal. Tricuspid regurgitation signal is inadequate for assessing PA pressure.  3. The mitral valve is normal in structure and function. Trivial mitral valve regurgitation. No evidence of mitral stenosis.  4. The aortic valve is tricuspid. Aortic valve regurgitation is not visualized. Mild aortic valve sclerosis is present, with no  evidence of aortic valve stenosis.  5. The inferior vena cava is normal in size with greater than 50% respiratory variability, suggesting right atrial pressure of 3 mmHg. FINDINGS  Left Ventricle: Left ventricular ejection fraction, by estimation, is 50%. The left ventricle has mildly decreased function. The left ventricle demonstrates regional wall motion abnormalities. The left ventricular internal cavity size was normal in size. There is no left ventricular hypertrophy. Left ventricular diastolic parameters are consistent with Grade I diastolic dysfunction (impaired relaxation). Right Ventricle: The right ventricular size is normal. No increase in right ventricular wall thickness. Right ventricular systolic function is normal. Tricuspid regurgitation signal is inadequate for assessing  PA pressure. Left Atrium: Left atrial size was normal in size. Right Atrium: Right atrial size was normal in size. Pericardium: There is no evidence of pericardial effusion. Mitral Valve: The mitral valve is normal in structure and function. Mild mitral annular calcification. Trivial mitral valve regurgitation. No evidence of mitral valve stenosis. Tricuspid Valve: The tricuspid valve is normal in structure. Tricuspid valve regurgitation is trivial. Aortic Valve: The aortic valve is tricuspid. Aortic valve regurgitation is not visualized. Mild aortic valve sclerosis is present, with no evidence of aortic valve stenosis. Pulmonic Valve: The pulmonic valve was normal in structure. Pulmonic valve regurgitation is not visualized. Aorta: The aortic root is normal in size and structure. Venous: The inferior vena cava is normal in size with greater than 50% respiratory variability, suggesting right atrial pressure of 3 mmHg. IAS/Shunts: No atrial level shunt detected by color flow Doppler.  LEFT VENTRICLE PLAX 2D LVIDd:         5.30 cm  Diastology LVIDs:         3.80 cm  LV e' lateral:   7.45 cm/s LV PW:         1.00 cm  LV E/e' lateral:  11.4 LV IVS:        0.90 cm  LV e' medial:    4.64 cm/s LVOT diam:     2.00 cm  LV E/e' medial:  18.4 LV SV:         68.17 ml LV SV Index:   38.50 LVOT Area:     3.14 cm  RIGHT VENTRICLE            IVC RV Basal diam:  2.50 cm    IVC diam: 1.40 cm RV S prime:     9.11 cm/s TAPSE (M-mode): 2.2 cm LEFT ATRIUM             Index       RIGHT ATRIUM           Index LA diam:        4.40 cm 2.36 cm/m  RA Area:     10.50 cm LA Vol (A2C):   55.0 ml 29.54 ml/m RA Volume:   18.60 ml  9.99 ml/m LA Vol (A4C):   52.7 ml 28.31 ml/m LA Biplane Vol: 55.7 ml 29.92 ml/m  AORTIC VALVE LVOT Vmax:   94.60 cm/s LVOT Vmean:  58.400 cm/s LVOT VTI:    0.217 m  AORTA Ao Root diam: 3.10 cm Ao Asc diam:  2.80 cm MITRAL VALVE MV Area (PHT): 3.91 cm     SHUNTS MV Decel Time: 194 msec     Systemic VTI:  0.22 m MV E velocity: 85.20 cm/s   Systemic Diam: 2.00 cm MV A velocity: 113.00 cm/s MV E/A ratio:  0.75 Loralie Champagne MD Electronically signed by Loralie Champagne MD Signature Date/Time: 12/30/2019/3:46:45 PM    Final          Assessment and Plan:   Preoperative evaluation for ORIF of the left wrist -Patient with no prior cardiac history. Echocardiogram done yesterday shows low normal LV systolic function with EF 50%.  There is apical septal and apical anterior hypokinesis and grade 1 diastolic dysfunction.  There are no significant valvular abnormalities.  -EKG is without ischemic changes. -Chest x-ray shows stable mild cardiomegaly without pulmonary edema. -Patient is independent at baseline, fairly active, with no exertional symptoms.  She is able to achieve 4 METS of activity. -The patient may proceed with surgery with no further  cardiac testing. -With her recent dizziness, although likely related to dehydration, would continue to monitor her on telemetry postoperatively to rule out any type of arrhythmia.  Left radius fracture -Secondary to a fall at home after becoming dizzy, likely due to dehydration. -Orthopedic surgery  is planning for open reduction internal fixation for tomorrow.  Dizziness -Patient had lightheadedness upon standing that led to her fall.  Likely related to dehydration in the setting of recent diarrheal illness. -Labs done yesterday morning showed mild decrease in blood pressure with standing, 114/61>93/52.  No associated tachycardia. -Patient was treated with some IV fluids, not currently infusing. -No significant arrhythmia noted on telemetry (she had one brief episode of wide-complex rhythm,?  PVCs versus some type of aberrancy) and patient denies any palpitations surrounding her events.  Continue to monitor on telemetry to rule out arrhythmia.  Left lateral rib fractures -No pneumothorax or effusion noted. -Patient continues with pain control and incentive spirometer.  Hypertension -Home medications include amlodipine 10 mg and nadolol 80 mg which have both been continued. -Blood pressure is currently well controlled.  AKI -Serum creatinine was elevated at 1.64 on presentation.  Much improved with hydration to 0.86 today.  Hypothyroidism -Continues on home Synthroid.      For questions or updates, please contact [facility] Please consult [email or web address] for contact info under     Signed, Daune Perch, NP  12/31/2019 10:32 AM   I have personally seen and examined this patient. I agree with the assessment and plan as outlined above. We are asked to see this pleasant 86 yo female today after a possible syncopal event leading to fall and fracture of her left radius. The consult is for pre-operative risk assessment prior to surgery on her wrist. She reports GI upset and diarrhea for several days with poor po intake. No other issues at home over the past few months. Echo shows normal LV systolic function with no valvular disease. No chest pain or palpitations.  Labs reviewed by me.  EKG reviewed by me and shows sinus rhythm with no ischemic changes.  Tele with sinus, PVCs  My  exam:  General: Well developed, well nourished, NAD  HEENT: OP clear, mucus membranes moist  SKIN: warm, dry. No rashes.  Neuro: No focal deficits  Musculoskeletal: Muscle strength 5/5 all ext  Psychiatric: Mood and affect normal  Neck: No JVD, no carotid bruits, no thyromegaly, no lymphadenopathy.  Lungs:Clear bilaterally, no wheezes, rhonci, crackles  Cardiovascular: Regular rate and rhythm. No murmurs, gallops or rubs.  Abdomen:Soft. Bowel sounds present. Non-tender.  Extremities: No lower extremity edema. Pulses are 2 + in the bilateral DP/PT.   Plan: She is high risk for any surgical procedure given her age. She has no high risk cardiac features. LV function is normal on echo. She has no symptoms concerning for angina or arrhythmias. No evidence of CHF. Her syncope was likely due to dehydration/hypovolemia.  I would proceed with her planned surgical procedure without further cardiac workup. Monitor on tele post op.   Lauree Chandler 12/31/2019 11:56 AM

## 2019-12-31 NOTE — Progress Notes (Signed)
PROGRESS NOTE    [name]  [id number] DOB: [birth year] DOA: 12/29/2019 PCP: [name], MD   Brief Narrative:  HPI On 12/29/2019 by Dr. Mitzi Hansen [name] is a 86 y.o. female with medical history significant for hypertension, hypothyroidism, polymyalgia rheumatica, and chronic kidney disease IIIa, presenting to [facility] for evaluation of severe left wrist pain after fall.  Patient reports that she had been experiencing upset stomach and diarrhea for a few days, had been lightheaded upon standing, had a fall without any significant appreciable injury yesterday, and then fell again this morning onto her left arm.  She became lightheaded after standing up which led to the fall, but she denies hitting her head or losing consciousness.  She was experiencing immediate and severe pain at the left wrist after the fall this morning.  She reports that the diarrhea seemed to resolve as of yesterday and she is no longer having any abdominal discomfort, nausea, or chills.  She has not been coughing or short of breath.  She denies chest pain or palpitations.  She has not had much pain at the left lateral chest wall.  She was treated with fentanyl and 500 cc bolus prior to arrival.  Interim history Admitted with AKI and distal left radius fracture.  Patient also noted to have left lateral rib fractures after fall.  Currently on pain control.  Pending orthopedic intervention on 2/20. Assessment & Plan   Acute kidney injury superimposed on chronic kidney disease, stage IIIa -Patient presented with severe left wrist pain after a fall at home found to have a left radius and left lateral rib fractures as well as acute kidney -Creatinine on admission was 1.6 which is up from her baseline of 1 Creatinine has now improved down to 0.86 -Suspect secondary to dehydration from recent diarrhea episodes which have now resolved -Continue IV fluid -monitor BMP  Acute hypoxic respiratory  failure -Oxygen saturation dropped to 86-87%, patient placed on supplemental oxygen -suspect likely due to pain from rib fractures and patient is unable to deeply inspire -Continue incentive spirometry  -CXR showed no acute infection. Worsening atelectasis L lung base  Distal left radius fracture -Secondary to fall at home -Patient states that she was very dizzy.  Suspect this is likely secondary to dehydration from recent diarrhea. -X-ray acute oblique fracture of the distal radius at the proximal end of the plate and screws in place for fixation of remote healed fracture. -Currently in sling -Orthopedic surgery consulted and appreciate -Continue IV pain control  Left lateral rib fractures -Left lateral eighth, ninth rib fractures noted on x-ray, seventh left lateral rib possibly involved.  No pneumothorax or effusion noted -Continue pain control and incentive spirometry  PMR -Continue home dose of prednisone  Essential hypertension -Continue amlodipine, nadolol- with holding parameters  Hypothyroidism -Continue Synthroid  Dizziness -suspect due to dehydration from recent diarrheal illness -Echocardiogram EF 50%, regional wall motion abnormalities, apical septal and anterior hypokinesis, G1DD -Orthostatic vitals unremarkable  Abnormal echocardiogram -Noted above, will consult cardiology  DVT Prophylaxis  heparin  Code Status: Full  Family Communication: None at bedside  Disposition Plan: Admitted. Patient from home, here post fall, broken wrist- pending surgery on 2/20. Cardio c/s for abn echo. Dispo TBD  Consultants Orthopedic surgery  Procedures  None  Antibiotics   Anti-infectives (From admission, onward)   None      Subjective:   [name] seen and examined today.  Patient unsure if she still has dizziness as  she states she has not been out of bed yet. She is afraid of standing on her own. She denies chest pain, shortness of breath, abdominal pain,  N/V/D/C.  Objective:   Vitals:   12/30/19 0942 12/30/19 1708 12/30/19 2027 12/31/19 0434  BP: (!) 93/52 (!) 143/61 128/64 130/62  Pulse: 78 67 66 67  Resp:  18 16 15   Temp:  98.5 F (36.9 C) 97.8 F (36.6 C) 98.2 F (36.8 C)  TempSrc:  Oral Oral Oral  SpO2: 93% 92% 92% 93%  Weight:   77.1 kg   Height:        Intake/Output Summary (Last 24 hours) at 12/31/2019 0839 Last data filed at 12/31/2019 0550 Gross per 24 hour  Intake 900 ml  Output 0 ml  Net 900 ml   Filed Weights   12/29/19 0913 12/29/19 1814 12/30/19 2027  Weight: 70.3 kg 76.7 kg 77.1 kg   Exam  General: Well developed, well nourished, NAD, appears stated age  4: NCAT, mucous membranes moist.   Cardiovascular: S1 S2 auscultated, RRR, no murmur  Respiratory: Clear to auscultation bilaterally with equal chest rise  Abdomen: Soft, nontender, nondistended, + bowel sounds  Extremities: warm dry without cyanosis clubbing or edema. LUE bruising, in splint.  Neuro: AAOx3, nonfocal  Psych: Normal affect and demeanor, pleasant   Data Reviewed: I have personally reviewed following labs and imaging studies  CBC: Recent Labs  Lab 12/29/19 0943 12/30/19 0448 12/31/19 0415  WBC 15.1* 11.1* 10.9*  NEUTROABS 11.9* 7.7  --   HGB 12.2 11.2* 11.3*  HCT 38.3 35.6* 35.9*  MCV 95.8 96.5 97.0  PLT 251 231 0000000   Basic Metabolic Panel: Recent Labs  Lab 12/29/19 0943 12/30/19 0448 12/31/19 0415  NA 139 140 141  K 3.7 4.0 4.2  CL 107 108 106  CO2 24 24 27   GLUCOSE 119* 105* 125*  BUN 31* 18 12  CREATININE 1.64* 1.03* 0.86  CALCIUM 8.6* 8.4* 9.0   GFR: Estimated Creatinine Clearance: 41.9 mL/min (by C-G formula based on SCr of 0.86 mg/dL). Liver Function Tests: No results for input(s): AST, ALT, ALKPHOS, BILITOT, PROT, ALBUMIN in the last 168 hours. No results for input(s): LIPASE, AMYLASE in the last 168 hours. No results for input(s): AMMONIA in the last 168 hours. Coagulation Profile: No results  for input(s): INR, PROTIME in the last 168 hours. Cardiac Enzymes: No results for input(s): CKTOTAL, CKMB, CKMBINDEX, TROPONINI in the last 168 hours. BNP (last 3 results) No results for input(s): PROBNP in the last 8760 hours. HbA1C: No results for input(s): HGBA1C in the last 72 hours. CBG: No results for input(s): GLUCAP in the last 168 hours. Lipid Profile: No results for input(s): CHOL, HDL, LDLCALC, TRIG, CHOLHDL, LDLDIRECT in the last 72 hours. Thyroid Function Tests: No results for input(s): TSH, T4TOTAL, FREET4, T3FREE, THYROIDAB in the last 72 hours. Anemia Panel: No results for input(s): VITAMINB12, FOLATE, FERRITIN, TIBC, IRON, RETICCTPCT in the last 72 hours. Urine analysis:    Component Value Date/Time   COLORURINE YELLOW 12/29/2019 [place] 12/29/2019 1244   LABSPEC 1.020 12/29/2019 1244   PHURINE 5.5 12/29/2019 [place] 12/29/2019 [place] 02/22/2016 0926   HGBUR NEGATIVE 12/29/2019 1244   BILIRUBINUR NEGATIVE 12/29/2019 1244   BILIRUBINUR negative 06/07/2019 1311   KETONESUR NEGATIVE 12/29/2019 1244   PROTEINUR NEGATIVE 12/29/2019 1244   UROBILINOGEN 0.2 06/07/2019 1311   UROBILINOGEN 0.2 02/22/2016 0926   NITRITE  NEGATIVE 12/29/2019 [place] 12/29/2019 1244   Sepsis Labs: @LABRCNTIP (procalcitonin:4,lacticidven:4)  ) Recent Results (from the past 240 hour(s))  Respiratory Panel by RT PCR (Flu A&B, Covid) - Nasopharyngeal Swab     Status: None   Collection Time: 12/29/19 12:15 PM   Specimen: Nasopharyngeal Swab  Result Value Ref Range Status   SARS Coronavirus 2 by RT PCR NEGATIVE NEGATIVE Final    Comment: (NOTE) SARS-CoV-2 target nucleic acids are NOT DETECTED. The SARS-CoV-2 RNA is generally detectable in upper respiratoy specimens during the acute phase of infection. The lowest concentration of SARS-CoV-2 viral copies this assay can detect is 131 copies/mL. A negative result does  not preclude SARS-Cov-2 infection and should not be used as the sole basis for treatment or other patient management decisions. A negative result may occur with  improper specimen collection/handling, submission of specimen other than nasopharyngeal swab, presence of viral mutation(s) within the areas targeted by this assay, and inadequate number of viral copies (<131 copies/mL). A negative result must be combined with clinical observations, patient history, and epidemiological information. The expected result is Negative. Fact Sheet for Patients:  PinkCheek.be Fact Sheet for Healthcare Providers:  GravelBags.it This test is not yet ap proved or cleared by the [place] FDA and  has been authorized for detection and/or diagnosis of SARS-CoV-2 by FDA under an Emergency Use Authorization (EUA). This EUA will remain  in effect (meaning this test can be used) for the duration of the COVID-19 declaration under Section 564(b)(1) of the Act, 21 U.S.C. section 360bbb-3(b)(1), unless the authorization is terminated or revoked sooner.    Influenza A by PCR NEGATIVE NEGATIVE Final   Influenza B by PCR NEGATIVE NEGATIVE Final    Comment: (NOTE) The Xpert Xpress SARS-CoV-2/FLU/RSV assay is intended as an aid in  the diagnosis of influenza from Nasopharyngeal swab specimens and  should not be used as a sole basis for treatment. Nasal washings and  aspirates are unacceptable for Xpert Xpress SARS-CoV-2/FLU/RSV  testing. Fact Sheet for Patients: PinkCheek.be Fact Sheet for Healthcare Providers: GravelBags.it This test is not yet approved or cleared by the [place] FDA and  has been authorized for detection and/or diagnosis of SARS-CoV-2 by  FDA under an Emergency Use Authorization (EUA). This EUA will remain  in effect (meaning this test can be used) for the duration of the   Covid-19 declaration under Section 564(b)(1) of the Act, 21  U.S.C. section 360bbb-3(b)(1), unless the authorization is  terminated or revoked. Performed at [facility], [address], [place], [place] [address]       Radiology Studies: DG Ribs Unilateral W/Chest Left  Result Date: 12/29/2019 CLINICAL DATA:  Fall, left lateral rib pain EXAM: LEFT RIBS AND CHEST - 3+ VIEW COMPARISON:  02/16/2018 FINDINGS: Fractures through the left lateral 8th and 9th ribs and possibly the lateral 7th rib. Heart is mildly enlarged. Left base atelectasis or scarring. No pneumothorax. No effusions. Mild wedged shaped appearance of the T11 vertebral body, age indeterminate. IMPRESSION: Left lateral 8th and 9th rib fractures and possible lateral 7th rib fracture. Left base atelectasis or scarring. No effusion or pneumothorax. Wedge-shaped appearance of the T11 vertebral body compatible with mild compression deformity, age indeterminate. Electronically Signed   By: Rolm Baptise M.D.   On: 12/29/2019 11:05   DG Wrist Complete Left  Result Date: 12/29/2019 CLINICAL DATA:  Left wrist pain after a fall today. Initial encounter. EXAM: LEFT WRIST - COMPLETE 3+ VIEW  COMPARISON:  Plain films left wrist 12/24/2009. FINDINGS: Volar plate and screws are in place for fixation of a distal radius fracture which was present on the 2011 examination. There is an acute oblique fracture of the radius at the proximal end of the plate with 1/2 shaft width lateral displacement of the distal fragment. Previously seen distal radius and ulnar fractures are healed. There is ulnar positive variance. Bones appear osteopenic. IMPRESSION: Acute oblique fracture of the distal radius at the proximal end of plate and screws in place for fixation of a remote healed fracture. No other acute abnormality. Remote healed distal radius and ulnar fractures. Osteopenia. Electronically Signed   By: Inge Rise M.D.   On: 12/29/2019 11:08   DG  CHEST PORT 1 VIEW  Result Date: 12/30/2019 CLINICAL DATA:  86 year old with hypoxia. Acute LEFT rib fractures due to a fall yesterday. EXAM: PORTABLE CHEST 1 VIEW COMPARISON:  12/29/2019 and earlier. FINDINGS: Cardiac silhouette mildly enlarged for AP portable technique, unchanged. Worsening atelectasis at the LEFT lung base since yesterday's examination. Lungs remain clear otherwise. Normal pulmonary vascularity. Mildly displaced LEFT posterolateral seventh, eighth and ninth rib fractures again noted. 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 IMPRESSION: 1. Worsening atelectasis at the LEFT lung base. No acute cardiopulmonary disease otherwise. 2. Stable mild cardiomegaly without pulmonary edema. Electronically Signed   By: Evangeline Dakin M.D.   On: 12/30/2019 11:29   ECHOCARDIOGRAM COMPLETE  Result Date: 12/30/2019    ECHOCARDIOGRAM REPORT   Patient Name:   [name] Date of Exam: 12/30/2019 Medical Rec #:  [id number]       Height:       66.0 in Accession #:    [id number]      Weight:       169.0 lb Date of Birth:  07/30/1923        BSA:          1.86 m Patient Age:    75 years        BP:           116/69 mmHg Patient Gender: F               HR:           71 bpm. Exam Location:  Inpatient Procedure: 2D Echo, Cardiac Doppler and Color Doppler Indications:    Dizziness [id number]  History:        Patient has no prior history of Echocardiogram examinations.                 Risk Factors:Hypertension. GERD. DVT. Hypothyroidism.  Sonographer:    Tiffany Dance Referring Phys: [id number] [place]  1. Left ventricular ejection fraction, by estimation, is 50%. The left ventricle has mildly decreased function. The left ventricle demonstrates regional wall motion abnormalities. Apical septal and apical anterior hypokinesis noted. Left ventricular diastolic parameters are consistent with Grade I diastolic dysfunction (impaired relaxation).  2. Right ventricular  systolic function is normal. The right ventricular size is normal. Tricuspid regurgitation signal is inadequate for assessing PA pressure.  3. The mitral valve is normal in structure and function. Trivial mitral valve regurgitation. No evidence of mitral stenosis.  4. The aortic valve is tricuspid. Aortic valve regurgitation is not visualized. Mild aortic valve sclerosis is present, with no evidence of aortic valve stenosis.  5. The inferior vena cava is normal in size with greater than 50% respiratory variability, suggesting right atrial pressure  of 3 mmHg. FINDINGS  Left Ventricle: Left ventricular ejection fraction, by estimation, is 50%. The left ventricle has mildly decreased function. The left ventricle demonstrates regional wall motion abnormalities. The left ventricular internal cavity size was normal in size. There is no left ventricular hypertrophy. Left ventricular diastolic parameters are consistent with Grade I diastolic dysfunction (impaired relaxation). Right Ventricle: The right ventricular size is normal. No increase in right ventricular wall thickness. Right ventricular systolic function is normal. Tricuspid regurgitation signal is inadequate for assessing PA pressure. Left Atrium: Left atrial size was normal in size. Right Atrium: Right atrial size was normal in size. Pericardium: There is no evidence of pericardial effusion. Mitral Valve: The mitral valve is normal in structure and function. Mild mitral annular calcification. Trivial mitral valve regurgitation. No evidence of mitral valve stenosis. Tricuspid Valve: The tricuspid valve is normal in structure. Tricuspid valve regurgitation is trivial. Aortic Valve: The aortic valve is tricuspid. Aortic valve regurgitation is not visualized. Mild aortic valve sclerosis is present, with no evidence of aortic valve stenosis. Pulmonic Valve: The pulmonic valve was normal in structure. Pulmonic valve regurgitation is not visualized. Aorta: The aortic  root is normal in size and structure. Venous: The inferior vena cava is normal in size with greater than 50% respiratory variability, suggesting right atrial pressure of 3 mmHg. IAS/Shunts: No atrial level shunt detected by color flow Doppler.  LEFT VENTRICLE PLAX 2D LVIDd:         5.30 cm  Diastology LVIDs:         3.80 cm  LV e' lateral:   7.45 cm/s LV PW:         1.00 cm  LV E/e' lateral: 11.4 LV IVS:        0.90 cm  LV e' medial:    4.64 cm/s LVOT diam:     2.00 cm  LV E/e' medial:  18.4 LV SV:         68.17 ml LV SV Index:   38.50 LVOT Area:     3.14 cm  RIGHT VENTRICLE            IVC RV Basal diam:  2.50 cm    IVC diam: 1.40 cm RV S prime:     9.11 cm/s TAPSE (M-mode): 2.2 cm LEFT ATRIUM             Index       RIGHT ATRIUM           Index LA diam:        4.40 cm 2.36 cm/m  RA Area:     10.50 cm LA Vol (A2C):   55.0 ml 29.54 ml/m RA Volume:   18.60 ml  9.99 ml/m LA Vol (A4C):   52.7 ml 28.31 ml/m LA Biplane Vol: 55.7 ml 29.92 ml/m  AORTIC VALVE LVOT Vmax:   94.60 cm/s LVOT Vmean:  58.400 cm/s LVOT VTI:    0.217 m  AORTA Ao Root diam: 3.10 cm Ao Asc diam:  2.80 cm MITRAL VALVE MV Area (PHT): 3.91 cm     SHUNTS MV Decel Time: 194 msec     Systemic VTI:  0.22 m MV E velocity: 85.20 cm/s   Systemic Diam: 2.00 cm MV A velocity: 113.00 cm/s MV E/A ratio:  0.75 Loralie Champagne MD Electronically signed by Loralie Champagne MD Signature Date/Time: 12/30/2019/3:46:45 PM    Final      Scheduled Meds: . amLODipine  10 mg Oral Daily  . gabapentin  600  mg Oral QHS  . heparin  5,000 Units Subcutaneous Q8H  . levothyroxine  50 mcg Oral Q0600  . nadolol  80 mg Oral Daily  . pantoprazole  40 mg Oral Daily  . predniSONE  10 mg Oral Q breakfast  . sodium chloride flush  3 mL Intravenous Q12H   Continuous Infusions:    LOS: 2 days   Time Spent in minutes   45 minutes  Aariah Godette D.O. on 12/31/2019 at 8:39 AM  Between 7am to 7pm - Please see pager noted on [email or web address]  After 7pm go to  [email or web address]  And look for the night coverage person covering for me after hours  [facility] Office  [phone]

## 2019-12-31 NOTE — Plan of Care (Signed)
  Problem: Pain Managment: Goal: General experience of comfort will improve Outcome: Progressing   

## 2020-01-01 ENCOUNTER — Encounter (HOSPITAL_COMMUNITY): Payer: Self-pay | Admitting: Family Medicine

## 2020-01-01 ENCOUNTER — Inpatient Hospital Stay (HOSPITAL_COMMUNITY): Payer: Medicare PPO | Admitting: Anesthesiology

## 2020-01-01 ENCOUNTER — Encounter (HOSPITAL_COMMUNITY)
Admission: EM | Disposition: A | Discharge status: Rehabilitation Facility | Payer: Self-pay | Source: Ambulatory Visit | Attending: Internal Medicine

## 2020-01-01 HISTORY — PX: OPEN REDUCTION INTERNAL FIXATION (ORIF) DISTAL RADIAL FRACTURE: SHX5989

## 2020-01-01 HISTORY — PX: HARDWARE REMOVAL: SHX979

## 2020-01-01 LAB — GLUCOSE, CAPILLARY: Glucose-Capillary: 87 mg/dL (ref 70–99)

## 2020-01-01 SURGERY — OPEN REDUCTION INTERNAL FIXATION (ORIF) DISTAL RADIUS FRACTURE
Anesthesia: Monitor Anesthesia Care | Site: Wrist | Laterality: Left

## 2020-01-01 MED ORDER — 0.9 % SODIUM CHLORIDE (POUR BTL) OPTIME
TOPICAL | Status: DC | PRN
  Administered 2020-01-01: 1000 mL

## 2020-01-01 MED ORDER — FENTANYL CITRATE (PF) 100 MCG/2ML IJ SOLN
INTRAMUSCULAR | Status: DC | PRN
  Administered 2020-01-01: 50 ug via INTRAVENOUS

## 2020-01-01 MED ORDER — BUPIVACAINE-EPINEPHRINE (PF) 0.5% -1:200000 IJ SOLN
INTRAMUSCULAR | Status: DC | PRN
  Administered 2020-01-01: 25 mL via PERINEURAL

## 2020-01-01 MED ORDER — PHENYLEPHRINE HCL-NACL 10-0.9 MG/250ML-% IV SOLN
INTRAVENOUS | Status: DC | PRN
  Administered 2020-01-01: 15 ug/min via INTRAVENOUS

## 2020-01-01 MED ORDER — PROPOFOL 10 MG/ML IV BOLUS
INTRAVENOUS | Status: AC
  Filled 2020-01-01: qty 20

## 2020-01-01 MED ORDER — ONDANSETRON HCL 4 MG/2ML IJ SOLN
INTRAMUSCULAR | Status: DC | PRN
  Administered 2020-01-01: 4 mg via INTRAVENOUS

## 2020-01-01 MED ORDER — FENTANYL CITRATE (PF) 250 MCG/5ML IJ SOLN
INTRAMUSCULAR | Status: AC
  Filled 2020-01-01: qty 5

## 2020-01-01 MED ORDER — CEFAZOLIN SODIUM-DEXTROSE 1-4 GM/50ML-% IV SOLN
1.0000 g | INTRAVENOUS | Status: AC
  Administered 2020-01-01: 1 g via INTRAVENOUS
  Filled 2020-01-01: qty 50

## 2020-01-01 MED ORDER — CEFAZOLIN SODIUM-DEXTROSE 1-4 GM/50ML-% IV SOLN
1.0000 g | Freq: Three times a day (TID) | INTRAVENOUS | Status: DC
  Administered 2020-01-01 – 2020-01-03 (×6): 1 g via INTRAVENOUS
  Filled 2020-01-01 (×7): qty 50

## 2020-01-01 MED ORDER — ONDANSETRON HCL 4 MG/2ML IJ SOLN
INTRAMUSCULAR | Status: AC
  Filled 2020-01-01: qty 2

## 2020-01-01 MED ORDER — LACTATED RINGERS IV SOLN: INTRAVENOUS | Status: DC

## 2020-01-01 MED ORDER — PROPOFOL 500 MG/50ML IV EMUL
INTRAVENOUS | Status: DC | PRN
  Administered 2020-01-01: 50 ug/kg/min via INTRAVENOUS

## 2020-01-01 MED ORDER — BUPIVACAINE HCL (PF) 0.25 % IJ SOLN
INTRAMUSCULAR | Status: AC
  Filled 2020-01-01: qty 30

## 2020-01-01 MED ORDER — MIDAZOLAM HCL 2 MG/2ML IJ SOLN
INTRAMUSCULAR | Status: AC
  Filled 2020-01-01: qty 2

## 2020-01-01 SURGICAL SUPPLY — 78 items
BIT DRILL 2 FAST STEP (BIT) ×1 IMPLANT
BIT DRILL 2.5X4 QC (BIT) ×1 IMPLANT
BLADE CLIPPER SURG (BLADE) IMPLANT
BNDG CMPR 9X4 STRL LF SNTH (GAUZE/BANDAGES/DRESSINGS) ×1
BNDG ELASTIC 3X5.8 VLCR STR LF (GAUZE/BANDAGES/DRESSINGS) ×2 IMPLANT
BNDG ELASTIC 4X5.8 VLCR STR LF (GAUZE/BANDAGES/DRESSINGS) ×2 IMPLANT
BNDG ESMARK 4X9 LF (GAUZE/BANDAGES/DRESSINGS) ×2 IMPLANT
BNDG GAUZE ELAST 4 BULKY (GAUZE/BANDAGES/DRESSINGS) ×2 IMPLANT
CANISTER SUCT 3000ML PPV (MISCELLANEOUS) ×2 IMPLANT
CORD BIPOLAR FORCEPS 12FT (ELECTRODE) ×2 IMPLANT
COVER SURGICAL LIGHT HANDLE (MISCELLANEOUS) ×2 IMPLANT
COVER WAND RF STERILE (DRAPES) ×2 IMPLANT
CUFF TOURN SGL QUICK 18X4 (TOURNIQUET CUFF) ×2 IMPLANT
CUFF TOURN SGL QUICK 24 (TOURNIQUET CUFF)
CUFF TRNQT CYL 24X4X16.5-23 (TOURNIQUET CUFF) IMPLANT
DECANTER SPIKE VIAL GLASS SM (MISCELLANEOUS) ×1 IMPLANT
DRAIN TLS ROUND 10FR (DRAIN) IMPLANT
DRAPE OEC MINIVIEW 54X84 (DRAPES) ×2 IMPLANT
DRAPE SURG 17X11 SM STRL (DRAPES) ×2 IMPLANT
DRSG ADAPTIC 3X8 NADH LF (GAUZE/BANDAGES/DRESSINGS) ×2 IMPLANT
ELECT REM PT RETURN 9FT ADLT (ELECTROSURGICAL)
ELECTRODE REM PT RTRN 9FT ADLT (ELECTROSURGICAL) IMPLANT
GAUZE 4X4 16PLY RFD (DISPOSABLE) ×1 IMPLANT
GAUZE SPONGE 4X4 12PLY STRL (GAUZE/BANDAGES/DRESSINGS) ×1 IMPLANT
GAUZE SPONGE 4X4 12PLY STRL LF (GAUZE/BANDAGES/DRESSINGS) ×1 IMPLANT
GAUZE XEROFORM 5X9 LF (GAUZE/BANDAGES/DRESSINGS) ×1 IMPLANT
GLOVE BIOGEL PI IND STRL 8.5 (GLOVE) ×1 IMPLANT
GLOVE BIOGEL PI INDICATOR 8.5 (GLOVE) ×1
GLOVE SURG ORTHO 8.0 STRL STRW (GLOVE) ×2 IMPLANT
GOWN STRL REUS W/ TWL LRG LVL3 (GOWN DISPOSABLE) ×3 IMPLANT
GOWN STRL REUS W/ TWL XL LVL3 (GOWN DISPOSABLE) ×1 IMPLANT
GOWN STRL REUS W/TWL LRG LVL3 (GOWN DISPOSABLE)
GOWN STRL REUS W/TWL XL LVL3 (GOWN DISPOSABLE) ×2
KIT BASIN OR (CUSTOM PROCEDURE TRAY) ×2 IMPLANT
KIT TURNOVER KIT B (KITS) ×2 IMPLANT
MANIFOLD NEPTUNE II (INSTRUMENTS) ×1 IMPLANT
NDL HYPO 25X1 1.5 SAFETY (NEEDLE) ×1 IMPLANT
NEEDLE 22X1 1/2 (OR ONLY) (NEEDLE) IMPLANT
NEEDLE HYPO 25X1 1.5 SAFETY (NEEDLE) ×2 IMPLANT
NS IRRIG 1000ML POUR BTL (IV SOLUTION) ×2 IMPLANT
PACK ORTHO EXTREMITY (CUSTOM PROCEDURE TRAY) ×2 IMPLANT
PAD ARMBOARD 7.5X6 YLW CONV (MISCELLANEOUS) ×4 IMPLANT
PAD CAST 3X4 CTTN HI CHSV (CAST SUPPLIES) IMPLANT
PAD CAST 4YDX4 CTTN HI CHSV (CAST SUPPLIES) ×1 IMPLANT
PADDING CAST COTTON 3X4 STRL (CAST SUPPLIES) ×2
PADDING CAST COTTON 4X4 STRL (CAST SUPPLIES) ×2
PEG SUBCHONDRAL SMOOTH 2.0X16 (Peg) ×1 IMPLANT
PEG SUBCHONDRAL SMOOTH 2.0X18 (Peg) ×3 IMPLANT
PEG THREADED 2.5MMX18MM LONG (Peg) ×1 IMPLANT
PEG THREADED 2.5MMX20MM LONG (Peg) ×2 IMPLANT
PLATE XLONG 24.4X89.5 LT (Plate) ×1 IMPLANT
PUTTY DBM STAGRAFT PLUS 2CC (Putty) ×1 IMPLANT
SCREW BN 12X3.5XNS CORT TI (Screw) IMPLANT
SCREW CORT 3.5X12 (Screw) ×8 IMPLANT
SCREW CORT 3.5X14 LNG (Screw) ×2 IMPLANT
SCREW CORT 3.5X15 (Screw) ×1 IMPLANT
SCREW CORT 3.5X16 LNG (Screw) ×1 IMPLANT
SOAP 2 % CHG 4 OZ (WOUND CARE) ×2 IMPLANT
SPLINT FIBERGLASS 3X35 (CAST SUPPLIES) ×1 IMPLANT
SPONGE LAP 4X18 RFD (DISPOSABLE) ×1 IMPLANT
STRIP CLOSURE SKIN 1/2X4 (GAUZE/BANDAGES/DRESSINGS) ×1 IMPLANT
SUCTION FRAZIER HANDLE 10FR (MISCELLANEOUS) ×1
SUCTION TUBE FRAZIER 10FR DISP (MISCELLANEOUS) ×1 IMPLANT
SUT ETHILON 4 0 PS 2 18 (SUTURE) IMPLANT
SUT MNCRL AB 4-0 PS2 18 (SUTURE) IMPLANT
SUT PROLENE 3 0 PS 2 (SUTURE) ×2 IMPLANT
SUT PROLENE 4 0 PS 2 18 (SUTURE) IMPLANT
SUT VIC AB 2-0 CT1 27 (SUTURE)
SUT VIC AB 2-0 CT1 TAPERPNT 27 (SUTURE) IMPLANT
SUT VIC AB 3-0 FS2 27 (SUTURE) IMPLANT
SUT VICRYL 4-0 PS2 18IN ABS (SUTURE) ×1 IMPLANT
SYR CONTROL 10ML LL (SYRINGE) ×1 IMPLANT
SYSTEM CHEST DRAIN TLS 7FR (DRAIN) IMPLANT
TOWEL GREEN STERILE (TOWEL DISPOSABLE) ×2 IMPLANT
TOWEL GREEN STERILE FF (TOWEL DISPOSABLE) ×2 IMPLANT
TUBE CONNECTING 12X1/4 (SUCTIONS) ×2 IMPLANT
WATER STERILE IRR 1000ML POUR (IV SOLUTION) ×2 IMPLANT
YANKAUER SUCT BULB TIP NO VENT (SUCTIONS) ×1 IMPLANT

## 2020-01-01 NOTE — Progress Notes (Signed)
Patient was seen and evaluated today.  The patient does have the fracture just proximal to the level of the previous hardware and repair of the distal radius. The site was marked today.  The plan is for revision open reduction internal fixation. Risks of surgery include but not limited to bleeding infection damage nearby nerves arteries or tendons nonunion malunion hardware failure and loss of motion and need for further surgical invention. Signed informed consent was obtained. Site was marked. Proceed with surgery today.

## 2020-01-01 NOTE — Op Note (Signed)
PREOPERATIVE DIAGNOSIS: Displaced left radial shaft fracture  POSTOPERATIVE DIAGNOSIS: Same  ATTENDING SURGEON: Dr. Iran Planas who scrubbed and present for the entire procedure  ASSISTANT SURGEON: None  ANESTHESIA: Regional block with IV sedation  OPERATIVE PROCEDURE: #1: Open treatment of left radial shaft fracture requiring internal fixation #2: Removal of left distal radius hardware deep implant #3: Radiographs 3 views left wrist and forearm  IMPLANTS: DVR standard plate with 3 5 bicortical screws in the shaft  RADIOGRAPHIC INTERPRETATION: AP lateral oblique views of the forearm and wrist do show the radial shaft fixation with good alignment of the radial shaft  SURGICAL INDICATIONS: Patient is a right-hand-dominant female who previously undergone over 8 years ago open reduction internal fixation of displaced distal radius fracture.  Patient reports she fell again and sustained a closed injury to her left distal radial shaft just at the proximal extent of the plate.  Patient elected undergo the above procedure.  The risks of surgery include but not limited to bleeding infection damage nearby nerves arteries or tendons loss of motion of the wrist and digits incomplete relief of symptoms nonunion malunion hardware failure need for further surgical invention.  SURGICAL TECHNIQUE: Patient was palpated via the preoperative holding area marked for marker made on left wrist indicate correct operative site.  Patient brought back to operating room placed supine on anesthesia table where the regional anesthetic was administered.  Well-padded tourniquet placed on the left brachium seal with appropriate drape.  Left upper extremities then prepped and draped normal sterile fashion.  A timeout was called the correct site was then applied procedure then begun.  Attention was then turned to the left wrist.  The limb was then elevated tourniquet insufflated.  A longitudinal incision was then made directly  over the FCR sheath.  Dissection was then carried down through the skin and subcutaneous tissue.  The FCR sheath was opened proximally distally.  Blunt dissection carried down to the radial shaft of the fracture site was then exposed.  The FPL was carefully protected.  What remained of the pronator quadratus was then carefully elevated to expose the plate.  The deep implant was then removed.  The wound was then thoroughly irrigated.  Fracture hematoma was then evacuated.  Open reduction was then performed.  The previous plate had been removed with the distal fixation points combination of distal locking pegs and screws were then used new implants were then used distally to lock the plate and screw construct distally.  Following this open reduction was then performed reducing the shaft distally and proximally.  Once this was carried out proximal screw fixation was then carried out with a combination of 3 5 bicortical nonlocking screws.  This kept the construct in good alignment as well as good alignment of the radial shaft.  Distal to the fracture site 2 cc of Biomet stay graft bone graft substitute was then packed into the defect and 2 more unicortical screws were then placed distal to the fracture site with good purchase dorsally.  The wound was then thoroughly irrigated.  Following this final radiographs of the left wrist and forearm were then obtained.  The subcutaneous tissues were then closed with Vicryl.  The tourniquet deflated.  Hemostasis obtained the skin was then closed using a running 3-0 Prolene suture.  Adaptic dressing a sterile compressive bandage then applied.  The patient was then placed in a well-padded sugar tong splint taken recovery in good condition.  POSTOPERATIVE PLAN: Patient be discharged to back to  the floor. Ice elevation Nonweightbearing the left upper extremity. Follow her in the hospital. She will need to be seen back in the office in approximately 2 weeks for wound check suture  removal and application of a long-arm cast for total of 2 more weeks.  Long-arm immobilization for 4 weeks.  Transition to a brace at the 4-week mark and begin a therapy regimen at the 6-week mark.  Radiographs at each visit.

## 2020-01-01 NOTE — Anesthesia Procedure Notes (Signed)
Anesthesia Regional Block: Supraclavicular block   Pre-Anesthetic Checklist: ,, timeout performed, Correct Patient, Correct Site, Correct Laterality, Correct Procedure, Correct Position, site marked, Risks and benefits discussed,  Surgical consent,  Pre-op evaluation,  At surgeon's request and post-op pain management  Laterality: Left  Prep: chloraprep       Needles:   Needle Type: Echogenic Stimulator Needle     Needle Length: 9cm  Needle Gauge: 21     Additional Needles:   Procedures:, nerve stimulator,,,,,,,   Nerve Stimulator or Paresthesia:  Response: 0.4 mA,   Additional Responses:   Narrative:  Start time: 01/01/2020 9:40 AM End time: 01/01/2020 9:50 AM Injection made incrementally with aspirations every 5 mL.  Performed by: Personally  Anesthesiologist: Lillia Abed, MD  Additional Notes: Monitors applied. Patient sedated. Sterile prep and drape,hand hygiene and sterile gloves were used. Relevant anatomy identified.Needle position confirmed.Local anesthetic injected incrementally after negative aspiration. Local anesthetic spread visualized around nerve(s). Vascular puncture avoided. No complications. Image printed for medical record.The patient tolerated the procedure well.

## 2020-01-01 NOTE — Anesthesia Postprocedure Evaluation (Signed)
Anesthesia Post Note  Patient: [name]  Procedure(s) Performed: OPEN REDUCTION INTERNAL FIXATION (ORIF) DISTAL RADIAL FRACTURE (Left Wrist) HARDWARE REMOVAL (Left Wrist)     Patient location during evaluation: PACU Anesthesia Type: Regional Level of consciousness: awake and alert and patient cooperative Pain management: pain level controlled Vital Signs Assessment: post-procedure vital signs reviewed and stable Respiratory status: spontaneous breathing and respiratory function stable Cardiovascular status: stable Anesthetic complications: no    Last Vitals:  Vitals:   01/01/20 1236 01/01/20 1308  BP: (!) 151/73 (!) 142/76  Pulse: 66 64  Resp: (!) 23 18  Temp:  36.6 C  SpO2: 92% 91%    Last Pain:  Vitals:   01/01/20 1308  TempSrc: Oral  PainSc:                  [name]

## 2020-01-01 NOTE — Transfer of Care (Signed)
Immediate Anesthesia Transfer of Care Note  Patient: [name]  Procedure(s) Performed: OPEN REDUCTION INTERNAL FIXATION (ORIF) DISTAL RADIAL FRACTURE (Left Wrist) HARDWARE REMOVAL (Left Wrist)  Patient Location: PACU  Anesthesia Type:MAC combined with regional for post-op pain  Level of Consciousness: awake, alert  and oriented  Airway & Oxygen Therapy: Patient Spontanous Breathing and Patient connected to face mask oxygen  Post-op Assessment: Report given to RN and Post -op Vital signs reviewed and stable  Post vital signs: Reviewed and stable  Last Vitals:  Vitals Value Taken Time  BP 110/65 01/01/20 1151  Temp    Pulse 58 01/01/20 1154  Resp 10 01/01/20 1154  SpO2 100 % 01/01/20 1154  Vitals shown include unvalidated device data.  Last Pain:  Vitals:   01/01/20 1151  TempSrc:   PainSc: (P) Asleep      Patients Stated Pain Goal: 0 (23/55/73 2202)  Complications: No apparent anesthesia complications

## 2020-01-01 NOTE — Plan of Care (Signed)
  Problem: Activity: Goal: Activity intolerance will improve Outcome: Progressing   

## 2020-01-01 NOTE — Progress Notes (Signed)
Pt back from OR. Pt denies any pain and is sleepy. Bed alarm on, call light with reach and bed in lowest position. VSS and no distress noted. Left arm in a sling.  Paulla Fore, RN

## 2020-01-01 NOTE — Progress Notes (Signed)
PROGRESS NOTE    [name]  [id number] DOB: 1922/12/24 DOA: 12/29/2019 PCP: [name], MD   Brief Narrative:  HPI On 12/29/2019 by Dr. Mitzi Hansen [name] is a 86 y.o. female with medical history significant for hypertension, hypothyroidism, polymyalgia rheumatica, and chronic kidney disease IIIa, presenting to [facility] for evaluation of severe left wrist pain after fall.  Patient reports that she had been experiencing upset stomach and diarrhea for a few days, had been lightheaded upon standing, had a fall without any significant appreciable injury yesterday, and then fell again this morning onto her left arm.  She became lightheaded after standing up which led to the fall, but she denies hitting her head or losing consciousness.  She was experiencing immediate and severe pain at the left wrist after the fall this morning.  She reports that the diarrhea seemed to resolve as of yesterday and she is no longer having any abdominal discomfort, nausea, or chills.  She has not been coughing or short of breath.  She denies chest pain or palpitations.  She has not had much pain at the left lateral chest wall.  She was treated with fentanyl and 500 cc bolus prior to arrival.  Interim history Admitted with AKI and distal left radius fracture.  Patient also noted to have left lateral rib fractures after fall.  Currently on pain control.  Pending orthopedic intervention on 2/20. Assessment & Plan   Acute kidney injury superimposed on chronic kidney disease, stage IIIa -resolved  -Patient presented with severe left wrist pain after a fall at home found to have a left radius and left lateral rib fractures as well as acute kidney -Creatinine on admission was 1.6 which is up from her baseline of 1 Creatinine has now improved down to 0.86 -Suspect secondary to dehydration from recent diarrhea episodes which have now resolved -monitor BMP  Acute hypoxic respiratory failure -Oxygen  saturation dropped to 86-87%, patient placed on supplemental oxygen -suspect likely due to pain from rib fractures and patient is unable to deeply inspire -Continue incentive spirometry  -CXR showed no acute infection. Worsening atelectasis L lung base -will attempt to wean off of oxygen likely after surgery  Distal left radius fracture -Secondary to fall at home -Patient states that she was very dizzy.  Suspect this is likely secondary to dehydration from recent diarrhea. -X-ray acute oblique fracture of the distal radius at the proximal end of the plate and screws in place for fixation of remote healed fracture. -Currently in sling -Orthopedic surgery consulted and appreciate -Continue IV pain control  Left lateral rib fractures -Left lateral eighth, ninth rib fractures noted on x-ray, seventh left lateral rib possibly involved.  No pneumothorax or effusion noted -Continue pain control and incentive spirometry  PMR -Continue home dose of prednisone  Essential hypertension -Continue amlodipine, nadolol- with holding parameters  Hypothyroidism -Continue Synthroid  Dizziness -suspect due to dehydration from recent diarrheal illness -Echocardiogram EF 50%, regional wall motion abnormalities, apical septal and anterior hypokinesis, G1DD -Orthostatic vitals unremarkable  Abnormal echocardiogram -Noted above -Cardiology consulted and appreciated, no further work up needed. Patient my proceed with surgical procedures. She has no high risk cardiac features, evidence of CHF.   DVT Prophylaxis  heparin  Code Status: Full  Family Communication: None at bedside  Disposition Plan: Admitted. Patient from home, here post fall, broken wrist- pending surgery today 2/20. Dispo TBD  Consultants Orthopedic surgery Cardiology  Procedures  Echocardiogram  Antibiotics   Anti-infectives (From  admission, onward)   Start     Dose/Rate Route Frequency Ordered Stop   01/01/20 0600   ceFAZolin (ANCEF) IVPB 2g/100 mL premix     2 g 200 mL/hr over 30 Minutes Intravenous To Short Stay 12/31/19 1928 01/02/20 0600   01/01/20 0600  vancomycin (VANCOCIN) IVPB 1000 mg/200 mL premix     1,000 mg 200 mL/hr over 60 Minutes Intravenous To Short Stay 12/31/19 1928 01/02/20 0600      Subjective:   [name] seen and examined today.  Patient complains of pain on her side when she moves or takes a deep breath in. She is looking forward to her surgery today. Denies current chest pain, shortness of breath, abdominal pain, N/V/D/C. Does not feel dizzy at this time, but has not been out of bed.  Objective:   Vitals:   12/31/19 0924 12/31/19 1801 12/31/19 2115 01/01/20 0451  BP: 128/60 (!) 162/73 137/65 (!) 142/73  Pulse: 63 71 69 72  Resp: 18 18 16 18   Temp: 98.2 F (36.8 C) 98.9 F (37.2 C) 98.3 F (36.8 C) 98.6 F (37 C)  TempSrc: Oral Oral Oral Oral  SpO2: 91% 91% 94% 94%  Weight:   71.7 kg   Height:        Intake/Output Summary (Last 24 hours) at 01/01/2020 [id number] Last data filed at 01/01/2020 0830 Gross per 24 hour  Intake 900 ml  Output 0 ml  Net 900 ml   Filed Weights   12/29/19 1814 12/30/19 2027 12/31/19 2115  Weight: 76.7 kg 77.1 kg 71.7 kg   Exam  General: Well developed, well nourished, NAD, appears younger than stated age  41: NCAT,  mucous membranes moist.   Cardiovascular: S1 S2 auscultated, RRR, no murmur  Respiratory: Clear to auscultation bilaterally   Abdomen: Soft, nontender, nondistended, + bowel sounds  Extremities: warm dry without cyanosis clubbing or edema. LUE bruising, in splint  Neuro: AAOx3, nonfocal  Psych: Appropriate mood and affect, pleasant   Data Reviewed: I have personally reviewed following labs and imaging studies  CBC: Recent Labs  Lab 12/29/19 0943 12/30/19 0448 12/31/19 0415  WBC 15.1* 11.1* 10.9*  NEUTROABS 11.9* 7.7  --   HGB 12.2 11.2* 11.3*  HCT 38.3 35.6* 35.9*  MCV 95.8 96.5 97.0  PLT 251 231  0000000   Basic Metabolic Panel: Recent Labs  Lab 12/29/19 0943 12/30/19 0448 12/31/19 0415  NA 139 140 141  K 3.7 4.0 4.2  CL 107 108 106  CO2 24 24 27   GLUCOSE 119* 105* 125*  BUN 31* 18 12  CREATININE 1.64* 1.03* 0.86  CALCIUM 8.6* 8.4* 9.0   GFR: Estimated Creatinine Clearance: 40.6 mL/min (by C-G formula based on SCr of 0.86 mg/dL). Liver Function Tests: No results for input(s): AST, ALT, ALKPHOS, BILITOT, PROT, ALBUMIN in the last 168 hours. No results for input(s): LIPASE, AMYLASE in the last 168 hours. No results for input(s): AMMONIA in the last 168 hours. Coagulation Profile: No results for input(s): INR, PROTIME in the last 168 hours. Cardiac Enzymes: No results for input(s): CKTOTAL, CKMB, CKMBINDEX, TROPONINI in the last 168 hours. BNP (last 3 results) No results for input(s): PROBNP in the last 8760 hours. HbA1C: No results for input(s): HGBA1C in the last 72 hours. CBG: Recent Labs  Lab 01/01/20 0650  GLUCAP 87   Lipid Profile: No results for input(s): CHOL, HDL, LDLCALC, TRIG, CHOLHDL, LDLDIRECT in the last 72 hours. Thyroid Function Tests: No results for input(s): TSH, T4TOTAL,  FREET4, T3FREE, THYROIDAB in the last 72 hours. Anemia Panel: No results for input(s): VITAMINB12, FOLATE, FERRITIN, TIBC, IRON, RETICCTPCT in the last 72 hours. Urine analysis:    Component Value Date/Time   COLORURINE YELLOW 12/29/2019 [place] 12/29/2019 1244   LABSPEC 1.020 12/29/2019 1244   PHURINE 5.5 12/29/2019 [place] 12/29/2019 1244   GLUCOSEU NEGATIVE 02/22/2016 0926   HGBUR NEGATIVE 12/29/2019 1244   BILIRUBINUR NEGATIVE 12/29/2019 1244   BILIRUBINUR negative 06/07/2019 1311   KETONESUR NEGATIVE 12/29/2019 1244   PROTEINUR NEGATIVE 12/29/2019 1244   UROBILINOGEN 0.2 06/07/2019 1311   UROBILINOGEN 0.2 02/22/2016 0926   NITRITE NEGATIVE 12/29/2019 1244   LEUKOCYTESUR NEGATIVE 12/29/2019 1244   Sepsis  Labs: @LABRCNTIP (procalcitonin:4,lacticidven:4)  ) Recent Results (from the past 240 hour(s))  Respiratory Panel by RT PCR (Flu A&B, Covid) - Nasopharyngeal Swab     Status: None   Collection Time: 12/29/19 12:15 PM   Specimen: Nasopharyngeal Swab  Result Value Ref Range Status   SARS Coronavirus 2 by RT PCR NEGATIVE NEGATIVE Final    Comment: (NOTE) SARS-CoV-2 target nucleic acids are NOT DETECTED. The SARS-CoV-2 RNA is generally detectable in upper respiratoy specimens during the acute phase of infection. The lowest concentration of SARS-CoV-2 viral copies this assay can detect is 131 copies/mL. A negative result does not preclude SARS-Cov-2 infection and should not be used as the sole basis for treatment or other patient management decisions. A negative result may occur with  improper specimen collection/handling, submission of specimen other than nasopharyngeal swab, presence of viral mutation(s) within the areas targeted by this assay, and inadequate number of viral copies (<131 copies/mL). A negative result must be combined with clinical observations, patient history, and epidemiological information. The expected result is Negative. Fact Sheet for Patients:  PinkCheek.be Fact Sheet for Healthcare Providers:  GravelBags.it This test is not yet ap proved or cleared by the [place] FDA and  has been authorized for detection and/or diagnosis of SARS-CoV-2 by FDA under an Emergency Use Authorization (EUA). This EUA will remain  in effect (meaning this test can be used) for the duration of the COVID-19 declaration under Section 564(b)(1) of the Act, 21 U.S.C. section 360bbb-3(b)(1), unless the authorization is terminated or revoked sooner.    Influenza A by PCR NEGATIVE NEGATIVE Final   Influenza B by PCR NEGATIVE NEGATIVE Final    Comment: (NOTE) The Xpert Xpress SARS-CoV-2/FLU/RSV assay is intended as an aid in   the diagnosis of influenza from Nasopharyngeal swab specimens and  should not be used as a sole basis for treatment. Nasal washings and  aspirates are unacceptable for Xpert Xpress SARS-CoV-2/FLU/RSV  testing. Fact Sheet for Patients: PinkCheek.be Fact Sheet for Healthcare Providers: GravelBags.it This test is not yet approved or cleared by the [place] FDA and  has been authorized for detection and/or diagnosis of SARS-CoV-2 by  FDA under an Emergency Use Authorization (EUA). This EUA will remain  in effect (meaning this test can be used) for the duration of the  Covid-19 declaration under Section 564(b)(1) of the Act, 21  U.S.C. section 360bbb-3(b)(1), unless the authorization is  terminated or revoked. Performed at [facility], [address], [place], [place] [address]   Surgical pcr screen     Status: None   Collection Time: 12/31/19  7:49 PM   Specimen: Nasal Mucosa; Nasal Swab  Result Value Ref Range Status   MRSA, PCR NEGATIVE NEGATIVE Final   Staphylococcus  aureus NEGATIVE NEGATIVE Final    Comment: (NOTE) The Xpert SA Assay (FDA approved for NASAL specimens in patients 15 years of age and older), is one component of a comprehensive surveillance program. It is not intended to diagnose infection nor to guide or monitor treatment. Performed at [facility], [place] [address] [place], [place] [address]       Radiology Studies: DG CHEST PORT 1 VIEW  Result Date: 12/30/2019 CLINICAL DATA:  86 year old with hypoxia. Acute LEFT rib fractures due to a fall yesterday. EXAM: PORTABLE CHEST 1 VIEW COMPARISON:  12/29/2019 and earlier. FINDINGS: Cardiac silhouette mildly enlarged for AP portable technique, unchanged. Worsening atelectasis at the LEFT lung base since yesterday's examination. Lungs remain clear otherwise. Normal pulmonary vascularity. Mildly displaced LEFT posterolateral seventh, eighth  and ninth rib fractures again noted. 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 IMPRESSION: 1. Worsening atelectasis at the LEFT lung base. No acute cardiopulmonary disease otherwise. 2. Stable mild cardiomegaly without pulmonary edema. Electronically Signed   By: Evangeline Dakin M.D.   On: 12/30/2019 11:29   ECHOCARDIOGRAM COMPLETE  Result Date: 12/30/2019    ECHOCARDIOGRAM REPORT   Patient Name:   SHARIA BODA Date of Exam: 12/30/2019 Medical Rec #:  [id number]       Height:       66.0 in Accession #:    [id number]      Weight:       169.0 lb Date of Birth:  [birth year]        BSA:          1.86 m Patient Age:    80 years        BP:           116/69 mmHg Patient Gender: F               HR:           71 bpm. Exam Location:  Inpatient Procedure: 2D Echo, Cardiac Doppler and Color Doppler Indications:    Dizziness [id number]  History:        Patient has no prior history of Echocardiogram examinations.                 Risk Factors:Hypertension. GERD. DVT. Hypothyroidism.  Sonographer:    Tiffany Dance Referring Phys: [id number] [place]  1. Left ventricular ejection fraction, by estimation, is 50%. The left ventricle has mildly decreased function. The left ventricle demonstrates regional wall motion abnormalities. Apical septal and apical anterior hypokinesis noted. Left ventricular diastolic parameters are consistent with Grade I diastolic dysfunction (impaired relaxation).  2. Right ventricular systolic function is normal. The right ventricular size is normal. Tricuspid regurgitation signal is inadequate for assessing PA pressure.  3. The mitral valve is normal in structure and function. Trivial mitral valve regurgitation. No evidence of mitral stenosis.  4. The aortic valve is tricuspid. Aortic valve regurgitation is not visualized. Mild aortic valve sclerosis is present, with no evidence of aortic valve stenosis.  5. The inferior vena cava is normal in size  with greater than 50% respiratory variability, suggesting right atrial pressure of 3 mmHg. FINDINGS  Left Ventricle: Left ventricular ejection fraction, by estimation, is 50%. The left ventricle has mildly decreased function. The left ventricle demonstrates regional wall motion abnormalities. The left ventricular internal cavity size was normal in size. There is no left ventricular hypertrophy. Left ventricular diastolic parameters are consistent with Grade I diastolic dysfunction (  impaired relaxation). Right Ventricle: The right ventricular size is normal. No increase in right ventricular wall thickness. Right ventricular systolic function is normal. Tricuspid regurgitation signal is inadequate for assessing PA pressure. Left Atrium: Left atrial size was normal in size. Right Atrium: Right atrial size was normal in size. Pericardium: There is no evidence of pericardial effusion. Mitral Valve: The mitral valve is normal in structure and function. Mild mitral annular calcification. Trivial mitral valve regurgitation. No evidence of mitral valve stenosis. Tricuspid Valve: The tricuspid valve is normal in structure. Tricuspid valve regurgitation is trivial. Aortic Valve: The aortic valve is tricuspid. Aortic valve regurgitation is not visualized. Mild aortic valve sclerosis is present, with no evidence of aortic valve stenosis. Pulmonic Valve: The pulmonic valve was normal in structure. Pulmonic valve regurgitation is not visualized. Aorta: The aortic root is normal in size and structure. Venous: The inferior vena cava is normal in size with greater than 50% respiratory variability, suggesting right atrial pressure of 3 mmHg. IAS/Shunts: No atrial level shunt detected by color flow Doppler.  LEFT VENTRICLE PLAX 2D LVIDd:         5.30 cm  Diastology LVIDs:         3.80 cm  LV e' lateral:   7.45 cm/s LV PW:         1.00 cm  LV E/e' lateral: 11.4 LV IVS:        0.90 cm  LV e' medial:    4.64 cm/s LVOT diam:     2.00 cm   LV E/e' medial:  18.4 LV SV:         68.17 ml LV SV Index:   38.50 LVOT Area:     3.14 cm  RIGHT VENTRICLE            IVC RV Basal diam:  2.50 cm    IVC diam: 1.40 cm RV S prime:     9.11 cm/s TAPSE (M-mode): 2.2 cm LEFT ATRIUM             Index       RIGHT ATRIUM           Index LA diam:        4.40 cm 2.36 cm/m  RA Area:     10.50 cm LA Vol (A2C):   55.0 ml 29.54 ml/m RA Volume:   18.60 ml  9.99 ml/m LA Vol (A4C):   52.7 ml 28.31 ml/m LA Biplane Vol: 55.7 ml 29.92 ml/m  AORTIC VALVE LVOT Vmax:   94.60 cm/s LVOT Vmean:  58.400 cm/s LVOT VTI:    0.217 m  AORTA Ao Root diam: 3.10 cm Ao Asc diam:  2.80 cm MITRAL VALVE MV Area (PHT): 3.91 cm     SHUNTS MV Decel Time: 194 msec     Systemic VTI:  0.22 m MV E velocity: 85.20 cm/s   Systemic Diam: 2.00 cm MV A velocity: 113.00 cm/s MV E/A ratio:  0.75 Loralie Champagne MD Electronically signed by Loralie Champagne MD Signature Date/Time: 12/30/2019/3:46:45 PM    Final      Scheduled Meds: . amLODipine  10 mg Oral Daily  . gabapentin  600 mg Oral QHS  . heparin  5,000 Units Subcutaneous Q8H  . levothyroxine  50 mcg Oral Q0600  . nadolol  80 mg Oral Daily  . pantoprazole  40 mg Oral Daily  . predniSONE  10 mg Oral Q breakfast  . sodium chloride flush  3 mL Intravenous Q12H   Continuous  Infusions: .  ceFAZolin (ANCEF) IV    . vancomycin       LOS: 3 days   Time Spent in minutes   45 minutes  Kendrick Haapala D.O. on 01/01/2020 at 8:53 AM  Between 7am to 7pm - Please see pager noted on [email or web address]  After 7pm go to [email or web address]  And look for the night coverage person covering for me after hours  [facility] Office  [phone]

## 2020-01-01 NOTE — Anesthesia Preprocedure Evaluation (Signed)
Anesthesia Evaluation  Patient identified by MRN, date of birth, ID band Patient awake    Reviewed: Allergy & Precautions, NPO status , Patient's Chart, lab work & pertinent test results  Airway Mallampati: II  TM Distance: >3 FB Neck ROM: Full    Dental   Pulmonary    Pulmonary exam normal        Cardiovascular hypertension, Pt. on medications Normal cardiovascular exam     Neuro/Psych    GI/Hepatic GERD  Medicated and Controlled,  Endo/Other  Hypothyroidism   Renal/GU Renal InsufficiencyRenal disease     Musculoskeletal   Abdominal   Peds  Hematology   Anesthesia Other Findings   Reproductive/Obstetrics                             Anesthesia Physical Anesthesia Plan  ASA: III  Anesthesia Plan: Regional and MAC   Post-op Pain Management:    Induction: Intravenous  PONV Risk Score and Plan: 2 and Ondansetron and Treatment may vary due to age or medical condition  Airway Management Planned: Nasal Cannula  Additional Equipment:   Intra-op Plan:   Post-operative Plan:   Informed Consent: I have reviewed the patients History and Physical, chart, labs and discussed the procedure including the risks, benefits and alternatives for the proposed anesthesia with the patient or authorized representative who has indicated his/her understanding and acceptance.       Plan Discussed with: CRNA and Surgeon  Anesthesia Plan Comments:         Anesthesia Quick Evaluation

## 2020-01-01 NOTE — Anesthesia Procedure Notes (Signed)
Procedure Name: MAC Date/Time: 01/01/2020 10:03 AM Performed by: Kyung Rudd, CRNA Pre-anesthesia Checklist: Patient identified, Emergency Drugs available, Suction available and Patient being monitored Patient Re-evaluated:Patient Re-evaluated prior to induction Oxygen Delivery Method: Simple face mask Induction Type: IV induction Placement Confirmation: positive ETCO2 Dental Injury: Teeth and Oropharynx as per pre-operative assessment

## 2020-01-02 LAB — BASIC METABOLIC PANEL
Anion gap: 8 (ref 5–15)
BUN: 14 mg/dL (ref 8–23)
CO2: 27 mmol/L (ref 22–32)
Calcium: 8.7 mg/dL — ABNORMAL LOW (ref 8.9–10.3)
Chloride: 103 mmol/L (ref 98–111)
Creatinine, Ser: 0.81 mg/dL (ref 0.44–1.00)
GFR calc Af Amer: >60 mL/min (ref >60)
GFR calc non Af Amer: >60 mL/min (ref >60)
Glucose, Bld: 113 mg/dL — ABNORMAL HIGH (ref 70–99)
Potassium: 4.1 mmol/L (ref 3.5–5.1)
Sodium: 138 mmol/L (ref 135–145)

## 2020-01-02 LAB — HEMOGLOBIN AND HEMATOCRIT, BLOOD
HCT: 32.9 % — ABNORMAL LOW (ref 36.0–46.0)
Hemoglobin: 10.7 g/dL — ABNORMAL LOW (ref 12.0–15.0)

## 2020-01-02 NOTE — Progress Notes (Signed)
Patient was seen and examined this morning The patient's splint was loosened. Continue with the postoperative regimen. Strict elevation was encouraged and also nurse education was given today to keep the hand above the heart. Do not recommend using a sling unless she is walking up and down the hallway to help hold the heavy arm. Please contact me if any further questions or issues arise.

## 2020-01-02 NOTE — Plan of Care (Signed)
  Problem: Education: Goal: Knowledge of disease and its progression will improve Outcome: Progressing   Problem: Health Behavior/Discharge Planning: Goal: Ability to manage health-related needs will improve Outcome: Progressing   Problem: Clinical Measurements: Goal: Complications related to the disease process or treatment will be avoided or minimized Outcome: Progressing   Problem: Activity: Goal: Activity intolerance will improve Outcome: Progressing   Problem: Nutritional: Goal: Ability to make appropriate dietary choices will improve Outcome: Progressing   Problem: Respiratory: Goal: Respiratory symptoms related to disease process will be avoided Outcome: Progressing   Problem: Self-Concept: Goal: Body image disturbance will be avoided or minimized Outcome: Progressing   Problem: Urinary Elimination: Goal: Progression of disease will be identified and treated Outcome: Progressing   Problem: Education: Goal: Knowledge of General Education information will improve Description: Including pain rating scale, medication(s)/side effects and non-pharmacologic comfort measures Outcome: Progressing   Problem: Health Behavior/Discharge Planning: Goal: Ability to manage health-related needs will improve Outcome: Progressing   Problem: Clinical Measurements: Goal: Ability to maintain clinical measurements within normal limits will improve Outcome: Progressing Goal: Will remain free from infection Outcome: Progressing Goal: Diagnostic test results will improve Outcome: Progressing Goal: Respiratory complications will improve Outcome: Progressing Goal: Cardiovascular complication will be avoided Outcome: Progressing   Problem: Activity: Goal: Risk for activity intolerance will decrease Outcome: Progressing   Problem: Nutrition: Goal: Adequate nutrition will be maintained Outcome: Progressing   Problem: Coping: Goal: Level of anxiety will decrease Outcome:  Progressing   Problem: Elimination: Goal: Will not experience complications related to bowel motility Outcome: Progressing Goal: Will not experience complications related to urinary retention Outcome: Progressing   Problem: Pain Managment: Goal: General experience of comfort will improve Outcome: Progressing   Problem: Safety: Goal: Ability to remain free from injury will improve Outcome: Progressing   Problem: Skin Integrity: Goal: Risk for impaired skin integrity will decrease Outcome: Progressing

## 2020-01-02 NOTE — Progress Notes (Signed)
Rehab Admissions Coordinator Note:  Patient was screened by Cleatrice Burke for appropriateness for an Inpatient Acute Rehab Consult per PT recs. .  At this time, we are recommending Inpatient Rehab consult. I will place order per protocol.  Cleatrice Burke RN MSN 01/02/2020, 1:12 PM  I can be reached at [phone].

## 2020-01-02 NOTE — Evaluation (Addendum)
Physical Therapy Evaluation Patient Details Name: [name] MRN: [id number] DOB: [birth year] Today's Date: 01/02/2020   History of Present Illness  [name] is a 86 y.o. female with medical history significant for hypertension, hypothyroidism, polymyalgia rheumatica, and chronic kidney disease IIIa, presenting to [facility] for evaluation of severe left wrist pain after fall.  Patient reports that she had been experiencing upset stomach and diarrhea for a few days, had been lightheaded upon standing, had a fall without any significant appreciable injury yesterday, and then fell again this morning onto her left arm. Xrays show radial fx.  Clinical Impression  Patient is pleasant, agrees to PT assessment. She reports 3 falls since December. Patient requires min assist for bed mobility, min assist with sit to stand. She ambulated 12 feet around bed to recliner. Cues needed for L UE NWB status and for safety. She will continue to benefit from skilled PT while here to improve functional independence and to improve balance.       Follow Up Recommendations CIR  Or home with HHPT and 24 hour assist.     Equipment Recommendations  Other (comment)(platform RW)    Recommendations for Other Services Rehab consult     Precautions / Restrictions Precautions Precautions: Fall Restrictions Weight Bearing Restrictions: Yes LUE Weight Bearing: Non weight bearing      Mobility  Bed Mobility Overal bed mobility: Needs Assistance Bed Mobility: Supine to Sit     Supine to sit: Min assist     General bed mobility comments: cues needed for NWB on left UE, assist needed to raise trunk  Transfers Overall transfer level: Needs assistance Equipment used: None;1 person hand held assist Transfers: Sit to/from Stand Sit to Stand: Min assist            Ambulation/Gait Ambulation/Gait assistance: Min assist Gait Distance (Feet): 12 Feet Assistive device: 1 person hand held assist Gait  Pattern/deviations: Step-to pattern;Decreased step length - right;Decreased step length - left;Decreased stride length;Narrow base of support;Shuffle Gait velocity: decreased   General Gait Details: patient unsteady, reliant on external support. Will most likely benefit from platform RW for balance  Stairs            Wheelchair Mobility    Modified Rankin (Stroke Patients Only)       Balance Overall balance assessment: Needs assistance Sitting-balance support: Feet supported Sitting balance-Leahy Scale: Good     Standing balance support: During functional activity;Single extremity supported Standing balance-Leahy Scale: Fair Standing balance comment: relaint on external support                             Pertinent Vitals/Pain Pain Assessment: Faces Faces Pain Scale: Hurts a little bit Pain Location: ribs Pain Descriptors / Indicators: Discomfort;Sore Pain Intervention(s): Monitored during session;Repositioned    Home Living Family/patient expects to be discharged to:: Private residence Living Arrangements: Alone Available Help at Discharge: Family;Available PRN/intermittently Type of Home: House Home Access: Stairs to enter   [employer] of Steps: 1 Home Layout: One level Home Equipment: Walker - 2 wheels Additional Comments: children live nearby and check on her frequently    Prior Function Level of Independence: Independent               Hand Dominance   Dominant Hand: Right    Extremity/Trunk Assessment   Upper Extremity Assessment Upper Extremity Assessment: Defer to OT evaluation    Lower Extremity Assessment Lower Extremity Assessment: Generalized  weakness    Cervical / Trunk Assessment Cervical / Trunk Assessment: Normal  Communication   Communication: No difficulties  Cognition Arousal/Alertness: Awake/alert Behavior During Therapy: WFL for tasks assessed/performed Overall Cognitive Status: Within Functional  Limits for tasks assessed                                        General Comments      Exercises     Assessment/Plan    PT Assessment Patient needs continued PT services  PT Problem List Decreased strength;Decreased mobility;Decreased activity tolerance;Decreased balance;Pain;Decreased safety awareness;Decreased knowledge of use of DME;Decreased knowledge of precautions       PT Treatment Interventions DME instruction;Gait training;Functional mobility training;Therapeutic activities;Stair training;Balance training;Therapeutic exercise;Patient/family education    PT Goals (Current goals can be found in the Care Plan section)  Acute Rehab PT Goals Patient Stated Goal: to return home with paid caregiver and assist from children PT Goal Formulation: With patient Time For Goal Achievement: 01/09/20 Potential to Achieve Goals: Fair    Frequency Min 5X/week   Barriers to discharge Decreased caregiver support patient alone with children who can provide intermittent care.    Co-evaluation               AM-PAC PT "6 Clicks" Mobility  Outcome Measure Help needed turning from your back to your side while in a flat bed without using bedrails?: A Lot Help needed moving from lying on your back to sitting on the side of a flat bed without using bedrails?: A Lot Help needed moving to and from a bed to a chair (including a wheelchair)?: A Lot Help needed standing up from a chair using your arms (e.g., wheelchair or bedside chair)?: A Lot Help needed to walk in hospital room?: A Lot Help needed climbing 3-5 steps with a railing? : A Lot 6 Click Score: 12    End of Session Equipment Utilized During Treatment: Gait belt Activity Tolerance: Patient tolerated treatment well Patient left: in chair;with call bell/phone within reach;with nursing/sitter in room Nurse Communication: Mobility status;Other (comment)(patient's chair alarm not working, Therapist, sports aware) PT Visit  Diagnosis: Unsteadiness on feet (R26.81);Other abnormalities of gait and mobility (R26.89);Muscle weakness (generalized) (M62.81);Difficulty in walking, not elsewhere classified (R26.2);Repeated falls (R29.6);Pain Pain - Right/Left: Left Pain - part of body: Arm(ribs)    Time: [id number] PT Time Calculation (min) (ACUTE ONLY): 46 min   Charges:   PT Evaluation $PT Eval Moderate Complexity: 1 Mod PT Treatments $Gait Training: 8-22 mins        Violett Hobbs, PT, GCS 01/02/20,12:33 PM

## 2020-01-02 NOTE — Progress Notes (Signed)
PROGRESS NOTE    [name]  [id number] DOB: 06/02/1923 DOA: 12/29/2019 PCP: [name], MD   Brief Narrative:  HPI On 12/29/2019 by Dr. Mitzi Hansen [name] is a 86 y.o. female with medical history significant for hypertension, hypothyroidism, polymyalgia rheumatica, and chronic kidney disease IIIa, presenting to [facility] for evaluation of severe left wrist pain after fall.  Patient reports that she had been experiencing upset stomach and diarrhea for a few days, had been lightheaded upon standing, had a fall without any significant appreciable injury yesterday, and then fell again this morning onto her left arm.  She became lightheaded after standing up which led to the fall, but she denies hitting her head or losing consciousness.  She was experiencing immediate and severe pain at the left wrist after the fall this morning.  She reports that the diarrhea seemed to resolve as of yesterday and she is no longer having any abdominal discomfort, nausea, or chills.  She has not been coughing or short of breath.  She denies chest pain or palpitations.  She has not had much pain at the left lateral chest wall.  She was treated with fentanyl and 500 cc bolus prior to arrival.  Interim history Admitted with AKI and distal left radius fracture.  Patient also noted to have left lateral rib fractures after fall.  Currently on pain control. S/p ORIF of left radial shift. Pending PT and OT. Assessment & Plan   Acute kidney injury superimposed on chronic kidney disease, stage IIIa -resolved  -Patient presented with severe left wrist pain after a fall at home found to have a left radius and left lateral rib fractures as well as acute kidney -Creatinine on admission was 1.6 which is up from her baseline of 1 Creatinine has now improved down to 0.81 -Suspect secondary to dehydration from recent diarrhea episodes which have now resolved -monitor BMP  Acute hypoxic respiratory  failure -Oxygen saturation dropped to 86-87%, patient placed on supplemental oxygen -suspect likely due to pain from rib fractures and patient is unable to deeply inspire -Continue incentive spirometry  -CXR showed no acute infection. Worsening atelectasis L lung base -will attempt to wean off of oxygen   Distal left radius fracture -Secondary to fall at home -Patient states that she was very dizzy.  Suspect this is likely secondary to dehydration from recent diarrhea. -X-ray acute oblique fracture of the distal radius at the proximal end of the plate and screws in place for fixation of remote healed fracture. -Currently in sling -Orthopedic surgery consulted and appreciate, s/p ORIF, removal of left distal radius hardware deep implants.  -Orthopedics recommended nonweightbearing of LUE. F/up in 2 weeks. -Continue IV pain control  Left lateral rib fractures -Left lateral eighth, ninth rib fractures noted on x-ray, seventh left lateral rib possibly involved.  No pneumothorax or effusion noted -Continue pain control and incentive spirometry  PMR -Continue home dose of prednisone  Essential hypertension -Continue amlodipine, nadolol- with holding parameters  Hypothyroidism -Continue Synthroid  Dizziness -suspect due to dehydration from recent diarrheal illness -Echocardiogram EF 50%, regional wall motion abnormalities, apical septal and anterior hypokinesis, G1DD -Orthostatic vitals unremarkable  Abnormal echocardiogram -Noted above -Cardiology consulted and appreciated, no further work up needed. Patient my proceed with surgical procedures. She has no high risk cardiac features, evidence of CHF.   Acute Normocytic Anemia -hemoglobin today 10.7, small drop from 11.3, likely due to recent surgery -continue to monitor H/H  DVT Prophylaxis  heparin  Code Status: Full  Family Communication: None at bedside  Disposition Plan: Admitted. Patient from home, here post fall, broken  wrist- s/p surgery today 2/20. Pending PT/OT. Dispo TBD.   Consultants Orthopedic surgery Cardiology  Procedures  Echocardiogram  #1: Open treatment of left radial shaft fracture requiring internal fixation #2: Removal of left distal radius hardware deep implant #3: Radiographs 3 views left wrist and forearm  Antibiotics   Anti-infectives (From admission, onward)   Start     Dose/Rate Route Frequency Ordered Stop   01/01/20 2030  ceFAZolin (ANCEF) IVPB 1 g/50 mL premix     1 g 100 mL/hr over 30 Minutes Intravenous Every 8 hours 01/01/20 1307     01/01/20 1315  ceFAZolin (ANCEF) IVPB 1 g/50 mL premix     1 g 100 mL/hr over 30 Minutes Intravenous NOW 01/01/20 1307 01/01/20 1454   01/01/20 0600  ceFAZolin (ANCEF) IVPB 2g/100 mL premix  Status:  Discontinued     2 g 200 mL/hr over 30 Minutes Intravenous To Short Stay 12/31/19 1928 01/01/20 1259   01/01/20 0600  vancomycin (VANCOCIN) IVPB 1000 mg/200 mL premix     1,000 mg 200 mL/hr over 60 Minutes Intravenous To Short Stay 12/31/19 1928 01/01/20 1055      Subjective:   [name] seen and examined today.  Feeling better today. Was able to sleep well last night. Patient denies chest pain, shortness of breath, abdominal pain, N/V/D/C,   Objective:   Vitals:   01/01/20 1236 01/01/20 1308 01/01/20 1753 01/02/20 0459  BP: (!) 151/73 (!) 142/76 (!) 120/48 (!) 112/56  Pulse: 66 64 92 65  Resp: (!) 23 18 18 20   Temp:  97.8 F (36.6 C) 97.6 F (36.4 C) 99.6 F (37.6 C)  TempSrc:  Oral Oral Oral  SpO2: 92% 91% 96% 95%  Weight:      Height:        Intake/Output Summary (Last 24 hours) at 01/02/2020 0818 Last data filed at 01/02/2020 0756 Gross per 24 hour  Intake 1243 ml  Output 911 ml  Net 332 ml   Filed Weights   12/29/19 1814 12/30/19 2027 12/31/19 2115  Weight: 76.7 kg 77.1 kg 71.7 kg   Exam  General: Well developed, well nourished, NAD, appears younger than stated age  70: NCAT, mucous membranes moist.    Cardiovascular: S1 S2 auscultated, RRR, no murmur  Respiratory: Clear to auscultation bilaterally   Abdomen: Soft, nontender, nondistended, + bowel sounds  Extremities: warm dry without cyanosis clubbing or edema. LUE bruising, splint in place/ LUE in sling   Neuro: AAOx3, nonfocal  Psych: Appropriate mood and affect, pleasant   Data Reviewed: I have personally reviewed following labs and imaging studies  CBC: Recent Labs  Lab 12/29/19 0943 12/30/19 0448 12/31/19 0415 01/02/20 0410  WBC 15.1* 11.1* 10.9*  --   NEUTROABS 11.9* 7.7  --   --   HGB 12.2 11.2* 11.3* 10.7*  HCT 38.3 35.6* 35.9* 32.9*  MCV 95.8 96.5 97.0  --   PLT 251 231 233  --    Basic Metabolic Panel: Recent Labs  Lab 12/29/19 0943 12/30/19 0448 12/31/19 0415 01/02/20 0410  NA 139 140 141 138  K 3.7 4.0 4.2 4.1  CL 107 108 106 103  CO2 24 24 27 27   GLUCOSE 119* 105* 125* 113*  BUN 31* 18 12 14   CREATININE 1.64* 1.03* 0.86 0.81  CALCIUM 8.6* 8.4* 9.0 8.7*   GFR: Estimated Creatinine Clearance: 43.1  mL/min (by C-G formula based on SCr of 0.81 mg/dL). Liver Function Tests: No results for input(s): AST, ALT, ALKPHOS, BILITOT, PROT, ALBUMIN in the last 168 hours. No results for input(s): LIPASE, AMYLASE in the last 168 hours. No results for input(s): AMMONIA in the last 168 hours. Coagulation Profile: No results for input(s): INR, PROTIME in the last 168 hours. Cardiac Enzymes: No results for input(s): CKTOTAL, CKMB, CKMBINDEX, TROPONINI in the last 168 hours. BNP (last 3 results) No results for input(s): PROBNP in the last 8760 hours. HbA1C: No results for input(s): HGBA1C in the last 72 hours. CBG: Recent Labs  Lab 01/01/20 0650  GLUCAP 87   Lipid Profile: No results for input(s): CHOL, HDL, LDLCALC, TRIG, CHOLHDL, LDLDIRECT in the last 72 hours. Thyroid Function Tests: No results for input(s): TSH, T4TOTAL, FREET4, T3FREE, THYROIDAB in the last 72 hours. Anemia Panel: No results for  input(s): VITAMINB12, FOLATE, FERRITIN, TIBC, IRON, RETICCTPCT in the last 72 hours. Urine analysis:    Component Value Date/Time   COLORURINE YELLOW 12/29/2019 [place] 12/29/2019 1244   LABSPEC 1.020 12/29/2019 1244   PHURINE 5.5 12/29/2019 [place] 12/29/2019 1244   GLUCOSEU NEGATIVE 02/22/2016 0926   HGBUR NEGATIVE 12/29/2019 1244   BILIRUBINUR NEGATIVE 12/29/2019 1244   BILIRUBINUR negative 06/07/2019 1311   KETONESUR NEGATIVE 12/29/2019 1244   PROTEINUR NEGATIVE 12/29/2019 1244   UROBILINOGEN 0.2 06/07/2019 1311   UROBILINOGEN 0.2 02/22/2016 0926   NITRITE NEGATIVE 12/29/2019 1244   LEUKOCYTESUR NEGATIVE 12/29/2019 1244   Sepsis Labs: @LABRCNTIP (procalcitonin:4,lacticidven:4)  ) Recent Results (from the past 240 hour(s))  Respiratory Panel by RT PCR (Flu A&B, Covid) - Nasopharyngeal Swab     Status: None   Collection Time: 12/29/19 12:15 PM   Specimen: Nasopharyngeal Swab  Result Value Ref Range Status   SARS Coronavirus 2 by RT PCR NEGATIVE NEGATIVE Final    Comment: (NOTE) SARS-CoV-2 target nucleic acids are NOT DETECTED. The SARS-CoV-2 RNA is generally detectable in upper respiratoy specimens during the acute phase of infection. The lowest concentration of SARS-CoV-2 viral copies this assay can detect is 131 copies/mL. A negative result does not preclude SARS-Cov-2 infection and should not be used as the sole basis for treatment or other patient management decisions. A negative result may occur with  improper specimen collection/handling, submission of specimen other than nasopharyngeal swab, presence of viral mutation(s) within the areas targeted by this assay, and inadequate number of viral copies (<131 copies/mL). A negative result must be combined with clinical observations, patient history, and epidemiological information. The expected result is Negative. Fact Sheet for Patients:   PinkCheek.be Fact Sheet for Healthcare Providers:  GravelBags.it This test is not yet ap proved or cleared by the [place] FDA and  has been authorized for detection and/or diagnosis of SARS-CoV-2 by FDA under an Emergency Use Authorization (EUA). This EUA will remain  in effect (meaning this test can be used) for the duration of the COVID-19 declaration under Section 564(b)(1) of the Act, 21 U.S.C. section 360bbb-3(b)(1), unless the authorization is terminated or revoked sooner.    Influenza A by PCR NEGATIVE NEGATIVE Final   Influenza B by PCR NEGATIVE NEGATIVE Final    Comment: (NOTE) The Xpert Xpress SARS-CoV-2/FLU/RSV assay is intended as an aid in  the diagnosis of influenza from Nasopharyngeal swab specimens and  should not be used as a sole basis for treatment. Nasal washings and  aspirates are unacceptable for Xpert Xpress SARS-CoV-2/FLU/RSV  testing.  Fact Sheet for Patients: PinkCheek.be Fact Sheet for Healthcare Providers: GravelBags.it This test is not yet approved or cleared by the [place] FDA and  has been authorized for detection and/or diagnosis of SARS-CoV-2 by  FDA under an Emergency Use Authorization (EUA). This EUA will remain  in effect (meaning this test can be used) for the duration of the  Covid-19 declaration under Section 564(b)(1) of the Act, 21  U.S.C. section 360bbb-3(b)(1), unless the authorization is  terminated or revoked. Performed at [facility], [address], [place], [place] [address]   Surgical pcr screen     Status: None   Collection Time: 12/31/19  7:49 PM   Specimen: Nasal Mucosa; Nasal Swab  Result Value Ref Range Status   MRSA, PCR NEGATIVE NEGATIVE Final   Staphylococcus aureus NEGATIVE NEGATIVE Final    Comment: (NOTE) The Xpert SA Assay (FDA approved for NASAL specimens in patients 78 years of  age and older), is one component of a comprehensive surveillance program. It is not intended to diagnose infection nor to guide or monitor treatment. Performed at [facility], [place] [address] [place], [place] [address]       Radiology Studies: No results found.   Scheduled Meds: . amLODipine  10 mg Oral Daily  . gabapentin  600 mg Oral QHS  . heparin  5,000 Units Subcutaneous Q8H  . levothyroxine  50 mcg Oral Q0600  . nadolol  80 mg Oral Daily  . pantoprazole  40 mg Oral Daily  . predniSONE  10 mg Oral Q breakfast  . sodium chloride flush  3 mL Intravenous Q12H   Continuous Infusions: .  ceFAZolin (ANCEF) IV 1 g (01/02/20 0606)  . lactated ringers 10 mL/hr at 01/01/20 0920     LOS: 4 days   Time Spent in minutes   30 minutes  Samule Life D.O. on 01/02/2020 at 8:18 AM  Between 7am to 7pm - Please see pager noted on [email or web address]  After 7pm go to [email or web address]  And look for the night coverage person covering for me after hours  [facility] Office  [phone]

## 2020-01-03 ENCOUNTER — Encounter: Payer: Self-pay | Admitting: *Deleted

## 2020-01-03 LAB — HEMOGLOBIN AND HEMATOCRIT, BLOOD
HCT: 34.2 % — ABNORMAL LOW (ref 36.0–46.0)
Hemoglobin: 10.8 g/dL — ABNORMAL LOW (ref 12.0–15.0)

## 2020-01-03 LAB — BASIC METABOLIC PANEL
Anion gap: 10 (ref 5–15)
BUN: 17 mg/dL (ref 8–23)
CO2: 27 mmol/L (ref 22–32)
Calcium: 8.8 mg/dL — ABNORMAL LOW (ref 8.9–10.3)
Chloride: 102 mmol/L (ref 98–111)
Creatinine, Ser: 0.92 mg/dL (ref 0.44–1.00)
GFR calc Af Amer: >60 mL/min (ref >60)
GFR calc non Af Amer: 53 mL/min — ABNORMAL LOW (ref >60)
Glucose, Bld: 108 mg/dL — ABNORMAL HIGH (ref 70–99)
Potassium: 3.8 mmol/L (ref 3.5–5.1)
Sodium: 139 mmol/L (ref 135–145)

## 2020-01-03 NOTE — Progress Notes (Signed)
Progress Note  Patient Name: [name] Date of Encounter: 01/03/2020  Primary Cardiologist: No primary care provider on file.   Subjective   [name] is doing well postoperatively. Still has some pain where she sustained the rib fractures. She denies any light headedness with standing, chest pain, palpitations or leg swelling.   Inpatient Medications    Scheduled Meds: . amLODipine  10 mg Oral Daily  . gabapentin  600 mg Oral QHS  . heparin  5,000 Units Subcutaneous Q8H  . levothyroxine  50 mcg Oral Q0600  . nadolol  80 mg Oral Daily  . pantoprazole  40 mg Oral Daily  . predniSONE  10 mg Oral Q breakfast  . sodium chloride flush  3 mL Intravenous Q12H   Continuous Infusions: .  ceFAZolin (ANCEF) IV 1 g (01/03/20 [id number])  . lactated ringers 10 mL/hr at 01/01/20 0920   PRN Meds: acetaminophen **OR** acetaminophen, fentaNYL (SUBLIMAZE) injection, ondansetron (ZOFRAN) IV, oxyCODONE-acetaminophen   Vital Signs    Vitals:   01/02/20 0938 01/02/20 1000 01/02/20 1802 01/02/20 2138  BP: (!) 121/56  (!) 105/49 131/64  Pulse: 68  70 66  Resp: 18  18 16   Temp: 98.9 F (37.2 C)  98.6 F (37 C) 98.5 F (36.9 C)  TempSrc: Oral  Oral Oral  SpO2: 94% 96% 91% 92%  Weight:      Height:        Intake/Output Summary (Last 24 hours) at 01/03/2020 0905 Last data filed at 01/03/2020 0600 Gross per 24 hour  Intake 590 ml  Output 150 ml  Net 440 ml   Last 3 Weights 12/31/2019 12/30/2019 12/29/2019  Weight (lbs) 158 lb 170 lb 169 lb  Weight (kg) 71.668 kg 77.111 kg 76.658 kg      Telemetry    NSR with occasional PVCs - Personally Reviewed  ECG     no new tracings since initial consult   Physical Exam   GEN: No acute distress.   Neck: No JVD Cardiac: RRR, no murmurs, rubs, or gallops.  Respiratory: Clear to auscultation bilaterally. GI: Soft, nontender, non-distended  MS: No lower extremity edema. LUE splinted Neuro:  Nonfocal  Psych: Normal affect   Labs      High Sensitivity Troponin:  No results for input(s): TROPONINIHS in the last 720 hours.    Chemistry Recent Labs  Lab 12/31/19 0415 01/02/20 0410 01/03/20 0412  NA 141 138 139  K 4.2 4.1 3.8  CL 106 103 102  CO2 27 27 27   GLUCOSE 125* 113* 108*  BUN 12 14 17   CREATININE 0.86 0.81 0.92  CALCIUM 9.0 8.7* 8.8*  GFRNONAA 58* >60 53*  GFRAA >60 >60 >60  ANIONGAP 8 8 10      Hematology Recent Labs  Lab 12/29/19 0943 12/29/19 0943 12/30/19 0448 12/30/19 0448 12/31/19 0415 01/02/20 0410 01/03/20 0412  WBC 15.1*  --  11.1*  --  10.9*  --   --   RBC 4.00  --  3.69*  --  3.70*  --   --   HGB 12.2   < > 11.2*   < > 11.3* 10.7* 10.8*  HCT 38.3   < > 35.6*   < > 35.9* 32.9* 34.2*  MCV 95.8  --  96.5  --  97.0  --   --   MCH 30.5  --  30.4  --  30.5  --   --   MCHC 31.9  --  31.5  --  31.5  --   --   RDW 15.9*  --  15.9*  --  15.8*  --   --   PLT 251  --  231  --  233  --   --    < > = values in this interval not displayed.    BNPNo results for input(s): BNP, PROBNP in the last 168 hours.   DDimer No results for input(s): DDIMER in the last 168 hours.   Radiology    No results found.  Cardiac Studies   TTE 12/30/19  1. Left ventricular ejection fraction, by estimation, is 50%. The left  ventricle has mildly decreased function. The left ventricle demonstrates  regional wall motion abnormalities. Apical septal and apical anterior  hypokinesis noted. Left ventricular  diastolic parameters are consistent with Grade I diastolic dysfunction  (impaired relaxation).  2. Right ventricular systolic function is normal. The right ventricular  size is normal. Tricuspid regurgitation signal is inadequate for assessing  PA pressure.  3. The mitral valve is normal in structure and function. Trivial mitral  valve regurgitation. No evidence of mitral stenosis.  4. The aortic valve is tricuspid. Aortic valve regurgitation is not  visualized. Mild aortic valve sclerosis is  present, with no evidence of  aortic valve stenosis.  5. The inferior vena cava is normal in size with greater than 50%  respiratory variability, suggesting right atrial pressure of 3 mmHg.   Patient Profile     86 y.o. female hx of hypertension, hypothyroidism, polymyalgia rheumatica and CKD stage III who was seen for preoperative evaluation at the request of Dr. Ree Kida.  Assessment & Plan    Preoperative cardiac consult -patient has done well s/p ORIF of left distal radius on 01/01/20 -denies chest pain, palpitations, or light headedness with standing -preop echo with normal EF -telemetry reviewed shows no evidence of arrhythmia -can continue current medical management    [facility] will sign off.   Medication Recommendations: continue current medical management Other recommendations (labs, testing, etc):  none Follow up as an outpatient:  Hospital follow-up with PCP on discharge   For questions or updates, please contact [facility] Please consult [email or web address] for contact info under       Signed, Delice Bison, DO  01/03/2020, 9:05 AM

## 2020-01-03 NOTE — Progress Notes (Signed)
Physical Therapy Treatment Patient Details Name: [name] MRN: [id number] DOB: 02/21/1923 Today's Date: 01/03/2020    History of Present Illness 86 yo female presenting to [facility] for severe left wrist pain after fall.  Pt reports that she had been experiencing upset stomach and diarrhea for a few days, had been lightheaded upon standing, had a fall without any significant appreciable injury yesterday, and then fell again this morning onto her left arm. Xrays show radial fx and left lateral 8th and 9th rib fxs (possibly the 7th as well). PMH including is a HTN, hypothyroidism, polymyalgia rheumatica, and chronic kidney disease III.    PT Comments    Pt was seen for mobility and strength assessment and treatment, and note her struggle with any movement of LUE causing pain.  Supported LUE in a sling, but also with shim of foam and support to both elevate hand and increase support of arm on chair.  Platform is too challenging for pt now but with practice may be better, but also may be better with hemiwalker.  Follow up with pt as tolerated, and will hopefully transfer to CIR when ready.   Follow Up Recommendations  CIR     Equipment Recommendations  Other (comment)    Recommendations for Other Services Rehab consult     Precautions / Restrictions Precautions Precautions: Fall Precaution Comments: Sling for comfort. Benefits from wearing it during mobility Restrictions Weight Bearing Restrictions: Yes LUE Weight Bearing: Non weight bearing  Permitted to use platform walker per ortho surgeon   Mobility  Bed Mobility Overal bed mobility: Needs Assistance             General bed mobility comments: up in chair when PT arrived  Transfers Overall transfer level: Needs assistance Equipment used: 1 person hand held assist Transfers: Sit to/from Stand Sit to Stand: Min assist;Mod assist         General transfer comment: mod to power up on  RUE  Ambulation/Gait Ambulation/Gait assistance: Mod assist;Min assist Gait Distance (Feet): 40 [phone]) Assistive device: 1 person hand held assist;Left platform walker Gait Pattern/deviations: Step-through pattern;Step-to pattern;Trunk flexed;Wide base of support Gait velocity: decreased Gait velocity interpretation: <1.31 ft/sec, indicative of household ambulator General Gait Details: requires help regardless of    Stairs             Wheelchair Mobility    Modified Rankin (Stroke Patients Only)       Balance Overall balance assessment: Needs assistance Sitting-balance support: Feet supported Sitting balance-Leahy Scale: Good     Standing balance support: During functional activity;Single extremity supported Standing balance-Leahy Scale: Fair Standing balance comment: requires assist with or without AD                            Cognition Arousal/Alertness: Awake/alert Behavior During Therapy: WFL for tasks assessed/performed Overall Cognitive Status: Within Functional Limits for tasks assessed                                 General Comments: Very motivated      Exercises      General Comments General comments (skin integrity, edema, etc.): pt in pain and worried about how to maneuver without jarring LUE, but in sling is more comfortable      Pertinent Vitals/Pain Pain Assessment: 0-10 Pain Score: 8  Pain Location: ribs Pain Descriptors / Indicators: Grimacing Pain Intervention(s):  Monitored during session;Repositioned    Home Living                      Prior Function            PT Goals (current goals can now be found in the care plan section) Acute Rehab PT Goals Patient Stated Goal: Return to home and be independent again Progress towards PT goals: Progressing toward goals    Frequency    Min 5X/week      PT Plan Current plan remains appropriate    Co-evaluation               AM-PAC PT "6 Clicks" Mobility   Outcome Measure  Help needed turning from your back to your side while in a flat bed without using bedrails?: A Lot Help needed moving from lying on your back to sitting on the side of a flat bed without using bedrails?: A Lot Help needed moving to and from a bed to a chair (including a wheelchair)?: A Lot Help needed standing up from a chair using your arms (e.g., wheelchair or bedside chair)?: A Lot Help needed to walk in hospital room?: A Little Help needed climbing 3-5 steps with a railing? : A Lot 6 Click Score: 13    End of Session Equipment Utilized During Treatment: Gait belt;Other (comment)(sling LUE) Activity Tolerance: Patient tolerated treatment well Patient left: in chair;with call bell/phone within reach;with chair alarm set Nurse Communication: Mobility status;Other (comment) PT Visit Diagnosis: Unsteadiness on feet (R26.81);Other abnormalities of gait and mobility (R26.89);Muscle weakness (generalized) (M62.81);Difficulty in walking, not elsewhere classified (R26.2);Repeated falls (R29.6);Pain Pain - Right/Left: Left Pain - part of body: Arm(Ribs)     Time: 1010-1050 PT Time Calculation (min) (ACUTE ONLY): 40 min  Charges:  $Gait Training: 23-37 mins $Therapeutic Activity: 8-22 mins                     [name] 01/03/2020, 4:50 PM   Mee Hives, PT MS Acute Rehab Dept. Number: [place] and [place]

## 2020-01-03 NOTE — Evaluation (Signed)
Occupational Therapy Evaluation Patient Details Name: [name] MRN: [id number] DOB: [birth year] Today's Date: 01/03/2020    History of Present Illness 86 yo female presenting to [facility] for severe left wrist pain after fall.  Pt reports that she had been experiencing upset stomach and diarrhea for a few days, had been lightheaded upon standing, had a fall without any significant appreciable injury yesterday, and then fell again this morning onto her left arm. Xrays show radial fx and left lateral 8th and 9th rib fxs (possibly the 7th as well). PMH including is a HTN, hypothyroidism, polymyalgia rheumatica, and chronic kidney disease III.   Clinical Impression   PTA, pt was living alone and was very independent with ADLs and IADLs; enjoys going on walks and reading. Pt currently requiring Mod-Max A for UB ADLs, Mod A for LB ADLs, and Min A for functional mobility. Pt presenting with decreased functional use of LUE, bilateral coordination, strength, and balance; also presenting with shuffling gait pattern and at risk for falls. Pt highly motivated to return to PLOF and has very good family support. Recommend dc to CIR for intensive OT to optimize independent, reduce fall risk and readmission, and decrease caregiver burden. Pt will require further acute OT to increase occupational performance and facilitate safe dc.    Follow Up Recommendations  CIR;Supervision/Assistance - 24 hour    Equipment Recommendations  3 in 1 bedside commode    Recommendations for Other Services Rehab consult;PT consult     Precautions / Restrictions Precautions Precautions: Fall Precaution Comments: Sling for comfort. Benefits from wearing it during mobility Restrictions Weight Bearing Restrictions: Yes LUE Weight Bearing: Non weight bearing      Mobility Bed Mobility Overal bed mobility: Needs Assistance Bed Mobility: Rolling;Sidelying to Sit Rolling: Min assist Sidelying to sit: Min guard;Min assist       General bed mobility comments: Educating pt on log roll techniques to reduce pain at ribs. Pt performing with Min Guard-Min A for managing LUE.  Transfers Overall transfer level: Needs assistance Equipment used: 1 person hand held assist Transfers: Sit to/from Stand Sit to Stand: Min assist;Mod assist         General transfer comment: Min A for balance when standing from EOB. Mod A for power up from low surface at toilet    Balance Overall balance assessment: Needs assistance Sitting-balance support: Feet supported Sitting balance-Leahy Scale: Good     Standing balance support: During functional activity;Single extremity supported Standing balance-Leahy Scale: Fair Standing balance comment: relaint on external support                           ADL either performed or assessed with clinical judgement   ADL Overall ADL's : Needs assistance/impaired Eating/Feeding: Set up;Sitting Eating/Feeding Details (indicate cue type and reason): Using one hand. Will need assistance for bilateral tasks Grooming: Minimal assistance;Standing Grooming Details (indicate cue type and reason): Min Guard A for standing balance at sink due to decreased balance and fear of falling. Min A for bilateral tasks such as opening tooth paste.  Upper Body Bathing: Moderate assistance;Sitting   Lower Body Bathing: Moderate assistance Lower Body Bathing Details (indicate cue type and reason): Pt performing peri care at sink with Min A for balance; requiring increased assistance for distal areas Upper Body Dressing : Maximal assistance;Sitting;Moderate assistance Upper Body Dressing Details (indicate cue type and reason): Educating pt on comepnsatory techniques for donning/doffing shirts. Pt doffing old gown with cues  and then donning new gown with Mod A. Max A for donning sling Lower Body Dressing: Moderate assistance;Sit to/from stand   Toilet Transfer: Moderate assistance;Ambulation;Grab  Information systems manager Details (indicate cue type and reason): Mod A to power up from low surface Toileting- Clothing Manipulation and Hygiene: Sitting/lateral lean;Min guard       Functional mobility during ADLs: Minimal assistance(single hand held A) General ADL Comments: Pt presenting with decreased functional use of LUE, balance, and strength. Pt very motivated despite pain. Initated education for compensatory techniques for ADLs     Vision         Perception     Praxis      Pertinent Vitals/Pain Pain Assessment: 0-10 Pain Score: 8  Pain Location: ribs Pain Descriptors / Indicators: Discomfort;Sore;Constant;Grimacing;Sharp Pain Intervention(s): Monitored during session;Limited activity within patient's tolerance;Repositioned     Hand Dominance Right   Extremity/Trunk Assessment Upper Extremity Assessment Upper Extremity Assessment: LUE deficits/detail LUE Deficits / Details: Left radial fx. Long splint in place. Pt able to move digits. Difficulty performing AROM due to pain adn weight of RUE.  LUE Coordination: decreased fine motor;decreased gross motor   Lower Extremity Assessment Lower Extremity Assessment: Generalized weakness   Cervical / Trunk Assessment Cervical / Trunk Assessment: Other exceptions Cervical / Trunk Exceptions: Fractures through the left lateral 8th and 9th ribs and possibly the 7th   Communication Communication Communication: No difficulties   Cognition Arousal/Alertness: Awake/alert Behavior During Therapy: WFL for tasks assessed/performed Overall Cognitive Status: Within Functional Limits for tasks assessed                                 General Comments: Very motivated   General Comments       Exercises Exercises: General Upper Extremity General Exercises - Upper Extremity Digit Composite Flexion: AROM;Left;5 reps;Supine Composite Extension: AROM;Left;5 reps;Supine   Shoulder Instructions      Home  Living Family/patient expects to be discharged to:: Private residence Living Arrangements: Alone Available Help at Discharge: Family;Available 24 hours/day Type of Home: House Home Access: Stairs to enter [employer] of Steps: 1 Entrance Stairs-Rails: None Home Layout: One level     Bathroom Shower/Tub: Occupational psychologist: Handicapped height     Home Equipment: Environmental consultant - 2 wheels;Grab bars - tub/shower   Additional Comments: Son and daughter who lives nextdoor      Prior Functioning/Environment Level of Independence: Independent        Comments: ADLs, IADLs, driving. Enjoys reading and goes for walks. Was going to OP PT for LLE injury (pulled hip tendon)        OT Problem List: Decreased strength;Decreased range of motion;Decreased activity tolerance;Impaired balance (sitting and/or standing);Decreased knowledge of use of DME or AE;Decreased knowledge of precautions;Impaired UE functional use;Pain      OT Treatment/Interventions: Self-care/ADL training;Therapeutic exercise;Energy conservation;DME and/or AE instruction;Therapeutic activities;Patient/family education    OT Goals(Current goals can be found in the care plan section) Acute Rehab OT Goals Patient Stated Goal: Return to home and be independent again OT Goal Formulation: With patient Time For Goal Achievement: 01/17/20 Potential to Achieve Goals: Good  OT Frequency: Min 3X/week   Barriers to D/C:            Co-evaluation              AM-PAC OT "6 Clicks" Daily Activity     Outcome Measure Help from another person  eating meals?: A Little Help from another person taking care of personal grooming?: A Little Help from another person toileting, which includes using toliet, bedpan, or urinal?: A Lot Help from another person bathing (including washing, rinsing, drying)?: A Lot Help from another person to put on and taking off regular upper body clothing?: A Lot Help from another  person to put on and taking off regular lower body clothing?: A Lot 6 Click Score: 14   End of Session Equipment Utilized During Treatment: Other (comment)(sling) Nurse Communication: Mobility status  Activity Tolerance: Patient tolerated treatment well Patient left: in chair;with call bell/phone within reach;with chair alarm set  OT Visit Diagnosis: Unsteadiness on feet (R26.81);Other abnormalities of gait and mobility (R26.89);Muscle weakness (generalized) (M62.81);Pain Pain - Right/Left: Left Pain - part of body: Arm(Ribs)                Time: [id number] OT Time Calculation (min): 34 min Charges:  OT General Charges $OT Visit: 1 Visit OT Evaluation $OT Eval Moderate Complexity: 1 Mod OT Treatments $Self Care/Home Management : 8-22 mins  Zac Torti MSOT, OTR/L Acute Rehab Pager: [phone] Office: [place] 01/03/2020, 10:21 AM

## 2020-01-03 NOTE — Progress Notes (Signed)
Inpatient Rehab Admissions:  Inpatient Rehab Consult received.  I met with patient at the bedside for rehabilitation assessment and to discuss goals and expectations of an inpatient rehab admission.  I also spoke to son over the phone.  Pt and family are hopeful that insurance with authorize CIR and can provide support at discharge, if needed.  Will open insurance for authorization and potentially admit pending approval.   Signed: Shann Medal, PT, DPT Admissions Coordinator [phone] 01/03/20  12:21 PM

## 2020-01-03 NOTE — Progress Notes (Signed)
PROGRESS NOTE    [name]  [id number] DOB: Aug 23, 1923 DOA: 12/29/2019 PCP: [name], MD   Brief Narrative:  HPI On 12/29/2019 by Dr. Mitzi Hansen [name] is a 86 y.o. female with medical history significant for hypertension, hypothyroidism, polymyalgia rheumatica, and chronic kidney disease IIIa, presenting to [facility] for evaluation of severe left wrist pain after fall.  Patient reports that she had been experiencing upset stomach and diarrhea for a few days, had been lightheaded upon standing, had a fall without any significant appreciable injury yesterday, and then fell again this morning onto her left arm.  She became lightheaded after standing up which led to the fall, but she denies hitting her head or losing consciousness.  She was experiencing immediate and severe pain at the left wrist after the fall this morning.  She reports that the diarrhea seemed to resolve as of yesterday and she is no longer having any abdominal discomfort, nausea, or chills.  She has not been coughing or short of breath.  She denies chest pain or palpitations.  She has not had much pain at the left lateral chest wall.  She was treated with fentanyl and 500 cc bolus prior to arrival.  Interim history Admitted with AKI and distal left radius fracture.  Patient also noted to have left lateral rib fractures after fall.  Currently on pain control. S/p ORIF of left radial shift. PT recommended CIR, currently pending assessment. Pending OT eval. Assessment & Plan   Acute kidney injury superimposed on chronic kidney disease, stage IIIa -resolved  -Patient presented with severe left wrist pain after a fall at home found to have a left radius and left lateral rib fractures as well as acute kidney -Creatinine on admission was 1.6 which is up from her baseline of 1 Creatinine has now improved down to 0.92 -Suspect secondary to dehydration from recent diarrhea episodes which have now resolved -monitor  BMP  Acute hypoxic respiratory failure -Oxygen saturation dropped to 86-87%, patient placed on supplemental oxygen -suspect likely due to pain from rib fractures and patient is unable to deeply inspire -Continue incentive spirometry  -CXR showed no acute infection. Worsening atelectasis L lung base -Patient weaned off of oxygen, currently on room air maintain oxygen saturations in the 90s  Distal left radius fracture -Secondary to fall at home -Patient states that she was very dizzy.  Suspect this is likely secondary to dehydration from recent diarrhea. -X-ray acute oblique fracture of the distal radius at the proximal end of the plate and screws in place for fixation of remote healed fracture. -Currently in sling -Orthopedic surgery consulted and appreciate, s/p ORIF, removal of left distal radius hardware deep implants.  -Orthopedics recommended nonweightbearing of LUE. F/up in 2 weeks. -Continue IV pain control -PT recommended CIR -OT pending -CIR consulted  Left lateral rib fractures -Left lateral eighth, ninth rib fractures noted on x-ray, seventh left lateral rib possibly involved.  No pneumothorax or effusion noted -Continue pain control and incentive spirometry  PMR -Continue home dose of prednisone  Essential hypertension -Continue amlodipine, nadolol- with holding parameters  Hypothyroidism -Continue Synthroid  Dizziness -suspect due to dehydration from recent diarrheal illness -Echocardiogram EF 50%, regional wall motion abnormalities, apical septal and anterior hypokinesis, G1DD -Orthostatic vitals unremarkable  Abnormal echocardiogram -Noted above -Cardiology consulted and appreciated, no further work up needed. Patient my proceed with surgical procedures. She has no high risk cardiac features, evidence of CHF.   Acute Normocytic Anemia -hemoglobin today 10.8,  small drop from 11.3, likely due to recent surgery -continue to monitor H/H  DVT Prophylaxis   heparin  Code Status: Full  Family Communication: None at bedside  Disposition Plan: Admitted. Patient from home, here post fall, broken wrist- s/p surgery today 2/20. Pending CIR and OT. Dispo TBD.   Consultants Orthopedic surgery Cardiology  Procedures  Echocardiogram  #1: Open treatment of left radial shaft fracture requiring internal fixation #2: Removal of left distal radius hardware deep implant #3: Radiographs 3 views left wrist and forearm  Antibiotics   Anti-infectives (From admission, onward)   Start     Dose/Rate Route Frequency Ordered Stop   01/01/20 2030  ceFAZolin (ANCEF) IVPB 1 g/50 mL premix     1 g 100 mL/hr over 30 Minutes Intravenous Every 8 hours 01/01/20 1307     01/01/20 1315  ceFAZolin (ANCEF) IVPB 1 g/50 mL premix     1 g 100 mL/hr over 30 Minutes Intravenous NOW 01/01/20 1307 01/01/20 1454   01/01/20 0600  ceFAZolin (ANCEF) IVPB 2g/100 mL premix  Status:  Discontinued     2 g 200 mL/hr over 30 Minutes Intravenous To Short Stay 12/31/19 1928 01/01/20 1259   01/01/20 0600  vancomycin (VANCOCIN) IVPB 1000 mg/200 mL premix     1,000 mg 200 mL/hr over 60 Minutes Intravenous To Short Stay 12/31/19 1928 01/01/20 1055      Subjective:   [name] seen and examined today.  Feeling better today. States she rested well overnight. Denies current chest pain, shortness of breath, abdominal pain, N/V/D/C. Continues to feel weak. States she needs some assistance getting out of bed. Hoping for inpatient rehab is a possibility.    Objective:   Vitals:   01/02/20 0938 01/02/20 1000 01/02/20 1802 01/02/20 2138  BP: (!) 121/56  (!) 105/49 131/64  Pulse: 68  70 66  Resp: 18  18 16   Temp: 98.9 F (37.2 C)  98.6 F (37 C) 98.5 F (36.9 C)  TempSrc: Oral  Oral Oral  SpO2: 94% 96% 91% 92%  Weight:      Height:        Intake/Output Summary (Last 24 hours) at 01/03/2020 0745 Last data filed at 01/03/2020 0600 Gross per 24 hour  Intake 710 ml  Output 150  ml  Net 560 ml   Filed Weights   12/29/19 1814 12/30/19 2027 12/31/19 2115  Weight: 76.7 kg 77.1 kg 71.7 kg   Exam  General: Well developed, well nourished, NAD, appears younger than stated age  89: NCAT, mucous membranes moist.   Cardiovascular: S1 S2 auscultated, RRR, no murmur  Respiratory: Clear to auscultation bilaterally  Abdomen: Soft, nontender, nondistended, + bowel sounds  Extremities: warm dry without cyanosis clubbing or edema. LUE bruising, splint in place, Left wrist elevated.  Neuro: AAOx3, nonfoal  Psych: Appropriate mood and affect, pleasant   Data Reviewed: I have personally reviewed following labs and imaging studies  CBC: Recent Labs  Lab 12/29/19 0943 12/30/19 0448 12/31/19 0415 01/02/20 0410 01/03/20 0412  WBC 15.1* 11.1* 10.9*  --   --   NEUTROABS 11.9* 7.7  --   --   --   HGB 12.2 11.2* 11.3* 10.7* 10.8*  HCT 38.3 35.6* 35.9* 32.9* 34.2*  MCV 95.8 96.5 97.0  --   --   PLT 251 231 233  --   --    Basic Metabolic Panel: Recent Labs  Lab 12/29/19 0943 12/30/19 0448 12/31/19 0415 01/02/20 0410 01/03/20 0412  NA  139 140 141 138 139  K 3.7 4.0 4.2 4.1 3.8  CL 107 108 106 103 102  CO2 24 24 27 27 27   GLUCOSE 119* 105* 125* 113* 108*  BUN 31* 18 12 14 17   CREATININE 1.64* 1.03* 0.86 0.81 0.92  CALCIUM 8.6* 8.4* 9.0 8.7* 8.8*   GFR: Estimated Creatinine Clearance: 38 mL/min (by C-G formula based on SCr of 0.92 mg/dL). Liver Function Tests: No results for input(s): AST, ALT, ALKPHOS, BILITOT, PROT, ALBUMIN in the last 168 hours. No results for input(s): LIPASE, AMYLASE in the last 168 hours. No results for input(s): AMMONIA in the last 168 hours. Coagulation Profile: No results for input(s): INR, PROTIME in the last 168 hours. Cardiac Enzymes: No results for input(s): CKTOTAL, CKMB, CKMBINDEX, TROPONINI in the last 168 hours. BNP (last 3 results) No results for input(s): PROBNP in the last 8760 hours. HbA1C: No results for  input(s): HGBA1C in the last 72 hours. CBG: Recent Labs  Lab 01/01/20 0650  GLUCAP 87   Lipid Profile: No results for input(s): CHOL, HDL, LDLCALC, TRIG, CHOLHDL, LDLDIRECT in the last 72 hours. Thyroid Function Tests: No results for input(s): TSH, T4TOTAL, FREET4, T3FREE, THYROIDAB in the last 72 hours. Anemia Panel: No results for input(s): VITAMINB12, FOLATE, FERRITIN, TIBC, IRON, RETICCTPCT in the last 72 hours. Urine analysis:    Component Value Date/Time   COLORURINE YELLOW 12/29/2019 [place] 12/29/2019 1244   LABSPEC 1.020 12/29/2019 1244   PHURINE 5.5 12/29/2019 [place] 12/29/2019 1244   GLUCOSEU NEGATIVE 02/22/2016 0926   HGBUR NEGATIVE 12/29/2019 1244   BILIRUBINUR NEGATIVE 12/29/2019 1244   BILIRUBINUR negative 06/07/2019 1311   KETONESUR NEGATIVE 12/29/2019 1244   PROTEINUR NEGATIVE 12/29/2019 1244   UROBILINOGEN 0.2 06/07/2019 1311   UROBILINOGEN 0.2 02/22/2016 0926   NITRITE NEGATIVE 12/29/2019 1244   LEUKOCYTESUR NEGATIVE 12/29/2019 1244   Sepsis Labs: @LABRCNTIP (procalcitonin:4,lacticidven:4)  ) Recent Results (from the past 240 hour(s))  Respiratory Panel by RT PCR (Flu A&B, Covid) - Nasopharyngeal Swab     Status: None   Collection Time: 12/29/19 12:15 PM   Specimen: Nasopharyngeal Swab  Result Value Ref Range Status   SARS Coronavirus 2 by RT PCR NEGATIVE NEGATIVE Final    Comment: (NOTE) SARS-CoV-2 target nucleic acids are NOT DETECTED. The SARS-CoV-2 RNA is generally detectable in upper respiratoy specimens during the acute phase of infection. The lowest concentration of SARS-CoV-2 viral copies this assay can detect is 131 copies/mL. A negative result does not preclude SARS-Cov-2 infection and should not be used as the sole basis for treatment or other patient management decisions. A negative result may occur with  improper specimen collection/handling, submission of specimen other than nasopharyngeal swab,  presence of viral mutation(s) within the areas targeted by this assay, and inadequate number of viral copies (<131 copies/mL). A negative result must be combined with clinical observations, patient history, and epidemiological information. The expected result is Negative. Fact Sheet for Patients:  PinkCheek.be Fact Sheet for Healthcare Providers:  GravelBags.it This test is not yet ap proved or cleared by the [place] FDA and  has been authorized for detection and/or diagnosis of SARS-CoV-2 by FDA under an Emergency Use Authorization (EUA). This EUA will remain  in effect (meaning this test can be used) for the duration of the COVID-19 declaration under Section 564(b)(1) of the Act, 21 U.S.C. section 360bbb-3(b)(1), unless the authorization is terminated or revoked sooner.    Influenza A by PCR NEGATIVE  NEGATIVE Final   Influenza B by PCR NEGATIVE NEGATIVE Final    Comment: (NOTE) The Xpert Xpress SARS-CoV-2/FLU/RSV assay is intended as an aid in  the diagnosis of influenza from Nasopharyngeal swab specimens and  should not be used as a sole basis for treatment. Nasal washings and  aspirates are unacceptable for Xpert Xpress SARS-CoV-2/FLU/RSV  testing. Fact Sheet for Patients: PinkCheek.be Fact Sheet for Healthcare Providers: GravelBags.it This test is not yet approved or cleared by the [place] FDA and  has been authorized for detection and/or diagnosis of SARS-CoV-2 by  FDA under an Emergency Use Authorization (EUA). This EUA will remain  in effect (meaning this test can be used) for the duration of the  Covid-19 declaration under Section 564(b)(1) of the Act, 21  U.S.C. section 360bbb-3(b)(1), unless the authorization is  terminated or revoked. Performed at [facility], [address], [place], [place] [address]   Surgical pcr screen      Status: None   Collection Time: 12/31/19  7:49 PM   Specimen: Nasal Mucosa; Nasal Swab  Result Value Ref Range Status   MRSA, PCR NEGATIVE NEGATIVE Final   Staphylococcus aureus NEGATIVE NEGATIVE Final    Comment: (NOTE) The Xpert SA Assay (FDA approved for NASAL specimens in patients 53 years of age and older), is one component of a comprehensive surveillance program. It is not intended to diagnose infection nor to guide or monitor treatment. Performed at [facility], [place] [address], [place], [place] [address]       Radiology Studies: No results found.   Scheduled Meds: . amLODipine  10 mg Oral Daily  . gabapentin  600 mg Oral QHS  . heparin  5,000 Units Subcutaneous Q8H  . levothyroxine  50 mcg Oral Q0600  . nadolol  80 mg Oral Daily  . pantoprazole  40 mg Oral Daily  . predniSONE  10 mg Oral Q breakfast  . sodium chloride flush  3 mL Intravenous Q12H   Continuous Infusions: .  ceFAZolin (ANCEF) IV 1 g (01/03/20 [id number])  . lactated ringers 10 mL/hr at 01/01/20 0920     LOS: 5 days   Time Spent in minutes   30 minutes  Elise Knobloch D.O. on 01/03/2020 at 7:45 AM  Between 7am to 7pm - Please see pager noted on [email or web address]  After 7pm go to [email or web address]  And look for the night coverage person covering for me after hours  [facility] Office  [phone]

## 2020-01-03 NOTE — Plan of Care (Signed)
  Problem: Safety: Goal: Ability to remain free from injury will improve Outcome: Progressing   Problem: Pain Managment: Goal: General experience of comfort will improve Outcome: Progressing   Problem: Activity: Goal: Activity intolerance will improve Outcome: Progressing   Problem: Health Behavior/Discharge Planning: Goal: Ability to manage health-related needs will improve Outcome: Progressing   Problem: Education: Goal: Knowledge of disease and its progression will improve Outcome: Progressing

## 2020-01-04 LAB — HEMOGLOBIN AND HEMATOCRIT, BLOOD
HCT: 33.6 % — ABNORMAL LOW (ref 36.0–46.0)
Hemoglobin: 10.8 g/dL — ABNORMAL LOW (ref 12.0–15.0)

## 2020-01-04 NOTE — Progress Notes (Signed)
PROGRESS NOTE    [name]  [id number] DOB: [birth year] DOA: 12/29/2019 PCP: [name], MD   Brief Narrative:  HPI On 12/29/2019 by Dr. Mitzi Hansen [name] is a 86 y.o. female with medical history significant for hypertension, hypothyroidism, polymyalgia rheumatica, and chronic kidney disease IIIa, presenting to [facility] for evaluation of severe left wrist pain after fall.  Patient reports that she had been experiencing upset stomach and diarrhea for a few days, had been lightheaded upon standing, had a fall without any significant appreciable injury yesterday, and then fell again this morning onto her left arm.  She became lightheaded after standing up which led to the fall, but she denies hitting her head or losing consciousness.  She was experiencing immediate and severe pain at the left wrist after the fall this morning.  She reports that the diarrhea seemed to resolve as of yesterday and she is no longer having any abdominal discomfort, nausea, or chills.  She has not been coughing or short of breath.  She denies chest pain or palpitations.  She has not had much pain at the left lateral chest wall.  She was treated with fentanyl and 500 cc bolus prior to arrival.  Interim history Admitted with AKI and distal left radius fracture.  Patient also noted to have left lateral rib fractures after fall.  Currently on pain control. S/p ORIF of left radial shift. PT recommended CIR, currently pending assessment. Pending OT eval. Assessment & Plan   Acute kidney injury superimposed on chronic kidney disease, stage IIIa -resolved  -Patient presented with severe left wrist pain after a fall at home found to have a left radius and left lateral rib fractures as well as acute kidney -Creatinine on admission was 1.6 which is up from her baseline of 1 Creatinine has now improved down to 0.92 -Suspect secondary to dehydration from recent diarrhea episodes which have now resolved -monitor  BMP  Acute hypoxic respiratory failure -Oxygen saturation dropped to 86-87%, patient placed on supplemental oxygen -suspect likely due to pain from rib fractures and patient is unable to deeply inspire -Continue incentive spirometry  -CXR showed no acute infection. Worsening atelectasis L lung base -Patient weaned off of oxygen, currently on room air maintain oxygen saturations in the 90s  Distal left radius fracture -Secondary to fall at home -Patient states that she was very dizzy. Suspect this is likely secondary to dehydration from recent diarrhea. -X-ray acute oblique fracture of the distal radius at the proximal end of the plate and screws in place for fixation of remote healed fracture. -Currently in sling -Orthopedic surgery consulted and appreciate, s/p ORIF, removal of left distal radius hardware deep implants.  -Orthopedics recommended nonweightbearing of LUE. F/up in 2 weeks. -Continue pain control -PT and OT recommended CIR, 3in1 bedside commode -CIR consulted- however peer to peer was done with Dr[facility] Ouida Sills from [facility], inpatient rehab was denied.  -TOC consulted for alternate discharge planning  Left lateral rib fractures -Left lateral eighth, ninth rib fractures noted on x-ray, seventh left lateral rib possibly involved. No pneumothorax or effusion noted -Continue pain control and incentive spirometry  PMR -Continue home dose of prednisone  Essential hypertension -Continue amlodipine, nadolol  Hypothyroidism -Continue Synthroid  Dizziness -suspect due to dehydration from recent diarrheal illness -Echocardiogram EF 50%, regional wall motion abnormalities, apical septal and anterior hypokinesis, G1DD -Orthostatic vitals unremarkable  Abnormal echocardiogram -Noted above -Cardiology consulted and appreciated, no further work up needed. Patient my proceed with surgical  procedures. She has no high risk cardiac features, evidence of CHF.   Acute  Normocytic Anemia -hemoglobin today 10.8 and stable -repeat CBC in one week  DVT Prophylaxis  heparin  Code Status: Full  Family Communication: None at bedside  Disposition Plan: Admitted. Patient from home, here post fall, broken wrist- s/p surgery today 2/20. Dispo TBD.   Consultants Orthopedic surgery Cardiology  Procedures  Echocardiogram  #1: Open treatment of left radial shaft fracture requiring internal fixation #2: Removal of left distal radius hardware deep implant #3: Radiographs 3 views left wrist and forearm  Antibiotics   Anti-infectives (From admission, onward)   Start     Dose/Rate Route Frequency Ordered Stop   01/01/20 2030  ceFAZolin (ANCEF) IVPB 1 g/50 mL premix  Status:  Discontinued     1 g 100 mL/hr over 30 Minutes Intravenous Every 8 hours 01/01/20 1307 01/03/20 1357   01/01/20 1315  ceFAZolin (ANCEF) IVPB 1 g/50 mL premix     1 g 100 mL/hr over 30 Minutes Intravenous NOW 01/01/20 1307 01/01/20 1454   01/01/20 0600  ceFAZolin (ANCEF) IVPB 2g/100 mL premix  Status:  Discontinued     2 g 200 mL/hr over 30 Minutes Intravenous To Short Stay 12/31/19 1928 01/01/20 1259   01/01/20 0600  vancomycin (VANCOCIN) IVPB 1000 mg/200 mL premix     1,000 mg 200 mL/hr over 60 Minutes Intravenous To Short Stay 12/31/19 1928 01/01/20 1055      Subjective:   [name] seen and examined today.  Feeling better today. Still having pain on her left side. Denies chest pain, shortness of breath, abdominal pain, N/V. Complains of constipation.     Objective:   Vitals:   01/03/20 2056 01/04/20 0631 01/04/20 0838 01/04/20 1300  BP: 127/65 (!) 114/57 130/70 117/60  Pulse: 72 63 65 65  Resp: 18 16 16 15   Temp: 98.5 F (36.9 C) 98.2 F (36.8 C) 98.8 F (37.1 C) 99.1 F (37.3 C)  TempSrc: Oral Oral Oral Oral  SpO2: 93% 91% 91% 95%  Weight:      Height:        Intake/Output Summary (Last 24 hours) at 01/04/2020 1542 Last data filed at 01/04/2020 0900 Gross per  24 hour  Intake 740 ml  Output --  Net 740 ml   Filed Weights   12/29/19 1814 12/30/19 2027 12/31/19 2115  Weight: 76.7 kg 77.1 kg 71.7 kg   Exam  General: Well developed, well nourished, NAD, appears younger than stated age  75: NCAT, mucous membranes moist.   Cardiovascular: S1 S2 auscultated, RRR, no murmur  Respiratory: Clear to auscultation bilaterally   Abdomen: Soft, nontender, nondistended, + bowel sounds  Extremities: warm dry without cyanosis clubbing or edema. LUE bruising, splint in place  Neuro: AAOx3, nonfocal  Psych: Appropriate mood and affect, pleasant   Data Reviewed: I have personally reviewed following labs and imaging studies  CBC: Recent Labs  Lab 12/29/19 0943 12/29/19 0943 12/30/19 0448 12/31/19 0415 01/02/20 0410 01/03/20 0412 01/04/20 0509  WBC 15.1*  --  11.1* 10.9*  --   --   --   NEUTROABS 11.9*  --  7.7  --   --   --   --   HGB 12.2   < > 11.2* 11.3* 10.7* 10.8* 10.8*  HCT 38.3   < > 35.6* 35.9* 32.9* 34.2* 33.6*  MCV 95.8  --  96.5 97.0  --   --   --   PLT 251  --  231 233  --   --   --    < > = values in this interval not displayed.   Basic Metabolic Panel: Recent Labs  Lab 12/29/19 0943 12/30/19 0448 12/31/19 0415 01/02/20 0410 01/03/20 0412  NA 139 140 141 138 139  K 3.7 4.0 4.2 4.1 3.8  CL 107 108 106 103 102  CO2 24 24 27 27 27   GLUCOSE 119* 105* 125* 113* 108*  BUN 31* 18 12 14 17   CREATININE 1.64* 1.03* 0.86 0.81 0.92  CALCIUM 8.6* 8.4* 9.0 8.7* 8.8*   GFR: Estimated Creatinine Clearance: 38 mL/min (by C-G formula based on SCr of 0.92 mg/dL). Liver Function Tests: No results for input(s): AST, ALT, ALKPHOS, BILITOT, PROT, ALBUMIN in the last 168 hours. No results for input(s): LIPASE, AMYLASE in the last 168 hours. No results for input(s): AMMONIA in the last 168 hours. Coagulation Profile: No results for input(s): INR, PROTIME in the last 168 hours. Cardiac Enzymes: No results for input(s): CKTOTAL,  CKMB, CKMBINDEX, TROPONINI in the last 168 hours. BNP (last 3 results) No results for input(s): PROBNP in the last 8760 hours. HbA1C: No results for input(s): HGBA1C in the last 72 hours. CBG: Recent Labs  Lab 01/01/20 0650  GLUCAP 87   Lipid Profile: No results for input(s): CHOL, HDL, LDLCALC, TRIG, CHOLHDL, LDLDIRECT in the last 72 hours. Thyroid Function Tests: No results for input(s): TSH, T4TOTAL, FREET4, T3FREE, THYROIDAB in the last 72 hours. Anemia Panel: No results for input(s): VITAMINB12, FOLATE, FERRITIN, TIBC, IRON, RETICCTPCT in the last 72 hours. Urine analysis:    Component Value Date/Time   COLORURINE YELLOW 12/29/2019 [place] 12/29/2019 1244   LABSPEC 1.020 12/29/2019 1244   PHURINE 5.5 12/29/2019 [place] 12/29/2019 1244   GLUCOSEU NEGATIVE 02/22/2016 0926   HGBUR NEGATIVE 12/29/2019 1244   BILIRUBINUR NEGATIVE 12/29/2019 1244   BILIRUBINUR negative 06/07/2019 1311   KETONESUR NEGATIVE 12/29/2019 1244   PROTEINUR NEGATIVE 12/29/2019 1244   UROBILINOGEN 0.2 06/07/2019 1311   UROBILINOGEN 0.2 02/22/2016 0926   NITRITE NEGATIVE 12/29/2019 1244   LEUKOCYTESUR NEGATIVE 12/29/2019 1244   Sepsis Labs: @LABRCNTIP (procalcitonin:4,lacticidven:4)  ) Recent Results (from the past 240 hour(s))  Respiratory Panel by RT PCR (Flu A&B, Covid) - Nasopharyngeal Swab     Status: None   Collection Time: 12/29/19 12:15 PM   Specimen: Nasopharyngeal Swab  Result Value Ref Range Status   SARS Coronavirus 2 by RT PCR NEGATIVE NEGATIVE Final    Comment: (NOTE) SARS-CoV-2 target nucleic acids are NOT DETECTED. The SARS-CoV-2 RNA is generally detectable in upper respiratoy specimens during the acute phase of infection. The lowest concentration of SARS-CoV-2 viral copies this assay can detect is 131 copies/mL. A negative result does not preclude SARS-Cov-2 infection and should not be used as the sole basis for treatment or other patient  management decisions. A negative result may occur with  improper specimen collection/handling, submission of specimen other than nasopharyngeal swab, presence of viral mutation(s) within the areas targeted by this assay, and inadequate number of viral copies (<131 copies/mL). A negative result must be combined with clinical observations, patient history, and epidemiological information. The expected result is Negative. Fact Sheet for Patients:  PinkCheek.be Fact Sheet for Healthcare Providers:  GravelBags.it This test is not yet ap proved or cleared by the [place] FDA and  has been authorized for detection and/or diagnosis of SARS-CoV-2 by FDA under an Emergency Use Authorization (EUA). This EUA will  remain  in effect (meaning this test can be used) for the duration of the COVID-19 declaration under Section 564(b)(1) of the Act, 21 U.S.C. section 360bbb-3(b)(1), unless the authorization is terminated or revoked sooner.    Influenza A by PCR NEGATIVE NEGATIVE Final   Influenza B by PCR NEGATIVE NEGATIVE Final    Comment: (NOTE) The Xpert Xpress SARS-CoV-2/FLU/RSV assay is intended as an aid in  the diagnosis of influenza from Nasopharyngeal swab specimens and  should not be used as a sole basis for treatment. Nasal washings and  aspirates are unacceptable for Xpert Xpress SARS-CoV-2/FLU/RSV  testing. Fact Sheet for Patients: PinkCheek.be Fact Sheet for Healthcare Providers: GravelBags.it This test is not yet approved or cleared by the [place] FDA and  has been authorized for detection and/or diagnosis of SARS-CoV-2 by  FDA under an Emergency Use Authorization (EUA). This EUA will remain  in effect (meaning this test can be used) for the duration of the  Covid-19 declaration under Section 564(b)(1) of the Act, 21  U.S.C. section 360bbb-3(b)(1), unless the  authorization is  terminated or revoked. Performed at [facility], [address] [place], [place] [address]   Surgical pcr screen     Status: None   Collection Time: 12/31/19  7:49 PM   Specimen: Nasal Mucosa; Nasal Swab  Result Value Ref Range Status   MRSA, PCR NEGATIVE NEGATIVE Final   Staphylococcus aureus NEGATIVE NEGATIVE Final    Comment: (NOTE) The Xpert SA Assay (FDA approved for NASAL specimens in patients 5 years of age and older), is one component of a comprehensive surveillance program. It is not intended to diagnose infection nor to guide or monitor treatment. Performed at [facility], [place] [address], [place], [place] [address]       Radiology Studies: No results found.   Scheduled Meds: . amLODipine  10 mg Oral Daily  . gabapentin  600 mg Oral QHS  . heparin  5,000 Units Subcutaneous Q8H  . levothyroxine  50 mcg Oral Q0600  . nadolol  80 mg Oral Daily  . pantoprazole  40 mg Oral Daily  . predniSONE  10 mg Oral Q breakfast  . sodium chloride flush  3 mL Intravenous Q12H   Continuous Infusions: . lactated ringers 10 mL/hr at 01/01/20 0920     LOS: 6 days   Time Spent in minutes   30 minutes  Karlynn Furrow D.O. on 01/04/2020 at 3:42 PM  Between 7am to 7pm - Please see pager noted on [email or web address]  After 7pm go to [email or web address]  And look for the night coverage person covering for me after hours  [facility] Office  [phone]

## 2020-01-04 NOTE — Progress Notes (Signed)
Inpatient Rehab Admissions Coordinator:   Notified by pt's insurance they have denied request for CIR. Will let pt know.  Shann Medal, PT, DPT Admissions Coordinator [phone] 01/04/20  3:13 PM

## 2020-01-05 ENCOUNTER — Encounter (HOSPITAL_COMMUNITY): Payer: Self-pay | Admitting: Family Medicine

## 2020-01-05 ENCOUNTER — Inpatient Hospital Stay (HOSPITAL_COMMUNITY): Payer: Medicare PPO

## 2020-01-05 LAB — URINALYSIS, ROUTINE W REFLEX MICROSCOPIC
Bilirubin Urine: NEGATIVE
Glucose, UA: NEGATIVE mg/dL
Hgb urine dipstick: NEGATIVE
Ketones, ur: NEGATIVE mg/dL
Leukocytes,Ua: NEGATIVE
Nitrite: NEGATIVE
Protein, ur: NEGATIVE mg/dL
Specific Gravity, Urine: 1.014 (ref 1.005–1.030)
pH: 6 (ref 5.0–8.0)

## 2020-01-05 MED ORDER — PIPERACILLIN-TAZOBACTAM 3.375 G IVPB
3.3750 g | Freq: Three times a day (TID) | INTRAVENOUS | Status: AC
  Administered 2020-01-06 – 2020-01-10 (×15): 3.375 g via INTRAVENOUS
  Filled 2020-01-05 (×17): qty 50

## 2020-01-05 MED ORDER — PIPERACILLIN-TAZOBACTAM 3.375 G IVPB 30 MIN
3.3750 g | Freq: Once | INTRAVENOUS | Status: AC
  Administered 2020-01-05: 3.375 g via INTRAVENOUS
  Filled 2020-01-05: qty 50

## 2020-01-05 MED ORDER — SODIUM CHLORIDE 0.9 % IV SOLN: INTRAVENOUS | Status: DC

## 2020-01-05 MED ORDER — DICYCLOMINE HCL 10 MG PO CAPS
10.0000 mg | ORAL_CAPSULE | Freq: Three times a day (TID) | ORAL | Status: DC
  Administered 2020-01-05 (×2): 10 mg via ORAL
  Filled 2020-01-05 (×2): qty 1

## 2020-01-05 MED ORDER — POLYETHYLENE GLYCOL 3350 17 G PO PACK
17.0000 g | PACK | Freq: Every day | ORAL | Status: DC
  Administered 2020-01-05 – 2020-01-10 (×5): 17 g via ORAL
  Filled 2020-01-05 (×8): qty 1

## 2020-01-05 MED ORDER — SACCHAROMYCES BOULARDII 250 MG PO CAPS
250.0000 mg | ORAL_CAPSULE | Freq: Two times a day (BID) | ORAL | Status: DC
  Administered 2020-01-05 – 2020-01-14 (×16): 250 mg via ORAL
  Filled 2020-01-05 (×17): qty 1

## 2020-01-05 MED ORDER — DICYCLOMINE HCL 10 MG PO CAPS: 20.0000 mg | ORAL_CAPSULE | Freq: Three times a day (TID) | ORAL | Status: DC

## 2020-01-05 MED ORDER — IOHEXOL 300 MG/ML  SOLN
100.0000 mL | Freq: Once | INTRAMUSCULAR | Status: AC | PRN
  Administered 2020-01-05: 100 mL via INTRAVENOUS

## 2020-01-05 MED ORDER — MORPHINE SULFATE (PF) 2 MG/ML IV SOLN: 0.5000 mg | INTRAVENOUS | Status: DC | PRN

## 2020-01-05 MED ORDER — MORPHINE SULFATE (PF) 2 MG/ML IV SOLN
2.0000 mg | INTRAVENOUS | Status: DC | PRN
  Administered 2020-01-05: 2 mg via INTRAVENOUS
  Filled 2020-01-05: qty 1

## 2020-01-05 MED ORDER — CHLORHEXIDINE GLUCONATE CLOTH 2 % EX PADS
6.0000 | MEDICATED_PAD | Freq: Every day | CUTANEOUS | Status: DC
  Administered 2020-01-06 – 2020-01-09 (×4): 6 via TOPICAL

## 2020-01-05 NOTE — Consult Note (Signed)
Reason for Consult:abdominal pain Referring Physician: Dr. Sebastian Ache is an 86 y.o. female.  HPI: Pt is a 72F with a h/o 86 y.o. female with medical history significant for hypertension, hypothyroidism, polymyalgia rheumatica-on steroids, and chronic kidney disease IIIa, presenting to [facility] for evaluation of severe left wrist pain after fall.  Pt was admitted and underwent L wrist repair by Dr. Caralyn Guile on 2/20.  Pt states she had been eating well and had no issues until MN last night.  She had cont increased abdominal pain.  Pt states she had minimal appetite.  She underwent CT today.  Ct results are as per below-pneumoperitoneum likely d/t perforated diverticuli.   Pt states that she had a previous c-scope approx 48yrs ago with no findings. She does state that she had diarrhea for the two weeks prior to her dizziness episode causing her to fall.  She denies any blood in her stool.  General surgery was consulted to eval and treat her d/t her perforated viscus.    Past Medical History:  Diagnosis Date  . DVT (deep venous thrombosis) ([place]) 09/2015   RLE  . GERD (gastroesophageal reflux disease)   . Hypertension   . Hypothyroidism   . 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 Peripheral vascular disease ([place])    peripheral neuropathy    Past Surgical History:  Procedure Laterality Date  . ABDOMINAL HYSTERECTOMY    . APPENDECTOMY    . BACK SURGERY    . BREAST SURGERY     left breast biopsy  . EYE SURGERY     cataract  . HARDWARE REMOVAL Left 01/01/2020   Procedure: HARDWARE REMOVAL;  Surgeon: Iran Planas, MD;  Location: [facility];  Service: Orthopedics;  Laterality: Left;  . LIPOMA EXCISION Left 04/25/2015   Procedure: EXCISION OF LIPOMA LEFT ARM;  Surgeon: Donnie Mesa, MD;  Location: [facility];  Service: General;  Laterality: Left;  . LUMBAR LAMINECTOMY/DECOMPRESSION MICRODISCECTOMY  11/20/2011   Procedure: LUMBAR  LAMINECTOMY/DECOMPRESSION MICRODISCECTOMY;  Surgeon: Jeneen Rinks P [place];  Location: [facility];  Service: Orthopedics;  Laterality: N/A;  Decompressive Laminectomy L2 to the Sacrum (X-Ray)  . OPEN REDUCTION INTERNAL FIXATION (ORIF) DISTAL RADIAL FRACTURE Left 01/01/2020   Procedure: OPEN REDUCTION INTERNAL FIXATION (ORIF) DISTAL RADIAL FRACTURE;  Surgeon: Iran Planas, MD;  Location: [facility];  Service: Orthopedics;  Laterality: Left;  . OTHER SURGICAL HISTORY     left wrist surgery - has plate in left wrist   . OTHER SURGICAL HISTORY     right knee surgery due to torn cartilage   . TONSILLECTOMY    . WRIST FRACTURE SURGERY Left     Family History  Problem Relation Age of Onset  . Cancer Mother        BREAST  . COPD Father   . Emphysema Father   . Cancer Daughter        breast    Social History:  reports that she has never smoked. She has never used smokeless tobacco. She reports that she does not drink alcohol or use drugs.  Allergies:  Allergies  Allergen Reactions  . Codeine Other (See Comments)    Nightmares, imagining things  . Keflex [Cephalexin] Nausea Only    Pt ended up in ER w/ CP    Medications: I have reviewed the patient's current medications.  Results for orders placed or performed during the hospital encounter of 12/29/19 (from the past 48 hour(s))  Hemoglobin  and hematocrit, blood     Status: Abnormal   Collection Time: 01/04/20  5:09 AM  Result Value Ref Range   Hemoglobin 10.8 (L) 12.0 - 15.0 g/dL   HCT 33.6 (L) 36.0 - 46.0 %    Comment: Performed at [facility] [address], [place], [place] [address]  Urinalysis, Routine w reflex microscopic     Status: Abnormal   Collection Time: 01/05/20  6:21 AM  Result Value Ref Range   Color, Urine YELLOW YELLOW   APPearance HAZY (A) CLEAR   Specific Gravity, Urine 1.014 1.005 - 1.030   pH 6.0 5.0 - 8.0   Glucose, UA NEGATIVE NEGATIVE mg/dL   Hgb urine dipstick NEGATIVE NEGATIVE   Bilirubin Urine NEGATIVE  NEGATIVE   Ketones, ur NEGATIVE NEGATIVE mg/dL   Protein, ur NEGATIVE NEGATIVE mg/dL   Nitrite NEGATIVE NEGATIVE   Leukocytes,Ua NEGATIVE NEGATIVE    Comment: Performed at [facility] [address], [place],  [address]    CT ABDOMEN PELVIS W CONTRAST  Result Date: 01/05/2020 CLINICAL DATA:  Abdominal pain, recent fall with known fractures EXAM: CT ABDOMEN AND PELVIS WITH CONTRAST TECHNIQUE: Multidetector CT imaging of the abdomen and pelvis was performed using the standard protocol following bolus administration of intravenous contrast. CONTRAST:  123mL OMNIPAQUE IOHEXOL 300 MG/ML  SOLN COMPARISON:  03/04/2016 FINDINGS: Lower chest: Limited imaging through the lung bases demonstrates minimal hypoventilatory changes. No effusion or pneumothorax. Hepatobiliary: No focal liver abnormality is seen. No gallstones, gallbladder wall thickening, or biliary dilatation. Pancreas: There is diffuse fatty atrophy of the pancreas. No acute abnormalities. Spleen: No splenic injury or perisplenic hematoma. Adrenals/Urinary Tract: Stable right renal cortical cyst. Kidneys enhance normally and symmetrically. No urinary tract calculi or obstructive uropathy. The bladder is unremarkable. Adrenal glands are stable. Stomach/Bowel: Inflammatory changes surround the sigmoid colon consistent with acute diverticulitis. There is extensive pneumoperitoneum consistent with ruptured diverticulum. I do not see any fluid collection or abscess. No bowel obstruction or ileus. Vascular/Lymphatic: Stable atherosclerosis of the abdominal aorta and its branches. No critical stenosis. No pathologic adenopathy within the abdomen or pelvis. Reproductive: Status post hysterectomy. No adnexal masses. Other: There is diffuse pneumoperitoneum consistent with perforated diverticulitis. Trace free fluid within the pelvis. No fluid collection or abscess. Musculoskeletal: Displaced posterolateral left seventh through ninth rib fractures  are noted. Wedge compression deformity of the T11 vertebral body appears chronic, as there is no visible fracture line or paraspinal hematoma. Reconstructed images demonstrate no additional findings. IMPRESSION: 1. Perforated sigmoid diverticulitis with extensive pneumoperitoneum. No fluid collection or abscess. 2. Displaced posterolateral left seventh through ninth rib fractures. 3. Wedge compression deformity of the T11 vertebral body appears chronic, as there is no visible fracture line or paraspinal hematoma. 4. Aortic Atherosclerosis (ICD10-I70.0). These results were called by telephone at the time of interpretation on 01/05/2020 at 7:32 pm to provider Lang Snow, who verbally acknowledged these results. Electronically Signed   By: Randa Ngo M.D.   On: 01/05/2020 19:32    Review of Systems  Constitutional: Negative for chills and fever.  HENT: Negative for ear discharge, hearing loss and sore throat.   Eyes: Negative for discharge.  Respiratory: Negative for cough and shortness of breath.   Cardiovascular: Negative for chest pain and leg swelling.  Gastrointestinal: Positive for abdominal pain and diarrhea. Negative for constipation, nausea and vomiting.  Musculoskeletal: Negative for myalgias and neck pain.  Skin: Negative for rash.  Allergic/Immunologic: Negative for environmental allergies.  Neurological: Negative for dizziness and seizures.  Hematological: Does not bruise/bleed easily.  Psychiatric/Behavioral: Negative for suicidal ideas.  All other systems reviewed and are negative.  Blood pressure 124/61, pulse 70, temperature 100.3 F (37.9 C), temperature source Oral, resp. rate 18, height 5\' 6"  (1.676 m), weight 70.8 kg, SpO2 98 %. Physical Exam  Constitutional: She is oriented to person, place, and time. Vital signs are normal. She appears well-developed and well-nourished.  Conversant No acute distress  Eyes: Lids are normal. No scleral icterus.  Pupils are equal round  and reactive No lid lag Moist conjunctiva  Neck: No tracheal tenderness present. No thyromegaly present.  No cervical lymphadenopathy  Cardiovascular: Normal rate, regular rhythm and intact distal pulses.  No murmur heard. Respiratory: Effort normal and breath sounds normal. She has no wheezes. She has no rales.  GI: Soft. She exhibits no distension. There is no hepatosplenomegaly. There is abdominal tenderness. There is rebound. There is no guarding. No hernia.  Neurological: She is alert and oriented to person, place, and time.  Normal gait and station  Skin: Skin is warm. No rash noted. No cyanosis. Nails show no clubbing.  Normal skin turgor  Psychiatric: Judgment normal.  Appropriate affect    Assessment/Plan: 86 y.o. female with perforated diverticulitis, pneumoperitoenum. hypertension  hypothyroidism  polymyalgia rheumatica chronic kidney disease IIIa  I discussed the situation with her and her daughter and son, Jana Half and Gershon Mussel.  I discussed her options and that this would likely require emergent surgery with colostomy.  She has had Zosyn started at this time.  I d/w them that d/t her age she is at increased risk of complications with anesthesia as well as from surgery.  At this time they all agree that they would like her to have abx for a 12-24h period of time and see if that helps.    We will repeat labs in the AM and con't to monitor her VS and abd exam.      Ralene Ok 01/05/2020, 8:21 PM

## 2020-01-05 NOTE — Progress Notes (Signed)
PT Cancellation Note  Patient Details Name: [name] MRN: [id number] DOB: 06-25-1923   Cancelled Treatment:    Reason Eval/Treat Not Completed: Medical issues which prohibited therapy;Pain limiting ability to participate.  Was drinking contrast earlier and in pain, then was still painful after having moved a bit for OT.  Decided she would wait for gait until after her CT today.  Re-attempt at another time.   Ramond Dial 01/05/2020, 2:31 PM   Mee Hives, PT MS Acute Rehab Dept. Number: [place]

## 2020-01-05 NOTE — Progress Notes (Signed)
PROGRESS NOTE    [name]  [id number] DOB: July 19, 1923 DOA: 12/29/2019 PCP: [name], MD   Brief Narrative:  Patient is a 86 year old female with history of hypertension, hypothyroidism, polymyalgia rheumatica, CKD 3A who presented initially to [facility] for the evaluation of severe left wrist pain after a fall.  She was also having abdominal pain, diarrhea for last few days, lightheaded upon standing and fell multiple times.  When she presented, she was found to have AKI, distal left radius fracture.  She also had left lateral rib fractures.  Orthopedics was consulted and she underwent ORIF of left radial fracture.  Physical therapy recommended CIR but insurance denied.  Plan is to discharge her home with home health now.  She complaint of severe abdominal pain today.  We have ordered a CT abdomen with contrast.  Assessment & Plan:   Principal Problem:   Acute renal failure superimposed on stage 3a chronic kidney disease ([place]) Active Problems:   Hypothyroidism   HTN (hypertension)   Preoperative cardiovascular examination   Closed fracture of multiple ribs   Closed fracture of left distal radius   Recurrent falls   AKI on CKD stage IIIa: Resolved.  Suspected secondary to dehydration from recent diarrhea episodes which now have resolved.  Acute hypoxic respiratory failure: Initially hypoxic on room air and had to be placed on supplemental oxygen.  Most likely secondary to pain/atelectasis from rib fractures.  Continue incentive spirometry.  Chest x-rays did not show any acute infection but showed atelectasis of left lung base.  Currently on room air  Distal left radius fracture: Secondary to fall.  X-ray showed oblique fracture of the distal radius at the proximal end of the plate and screws in place for fixation of remote healed fracture.  Currently in sling.  Status post ORIF by orthopedics.  Removal of left distal radius hardware deep implants.   Orthopedics recommended not weightbearing of left upper extremity .  Follow-up recommended in 2 weeks.  Continue pain management. PT/OT recommended CIR but insurance denied.  Plan for  home with home health.  Lateral left rib fractures: Fracture of lateral eighth, ninth ribs on chest x-ray.  Continue pain management, incentive spirometry  Polymyalgia rheumatica: Continue home dose of prednisone  Hypertension: Currently normotensive.  Continue amlodipine, nadolol  Hypothyroidism: Continue Synthyroid  Dizziness/fall: Suspected secondary to dehydration.  Echocardiogram showed ejection fraction [id number], grade 1 diastolic dysfunction.  Orthostatic vitals were negative echo also showed regional wall motion abnormalities, apical septal and anterior hypokinesis.  Cardiology was consulted, no further work-up needed.  Normocytic anemia: Hemoglobin stable.  Continue to monitor.  Abdominal pain: Complains of severe abdominal pain on the lower region.  No fever.  She has been having problem with voiding and was frequently draining urine.  We are doing in and out cath.  Will check CT with contrast today.  Continue dicyclomine.UA negative for UTI.            DVT prophylaxis:Heparin Burna Code Status: Full Family Communication: Discussed with daughter on phone on 01/05/2020 Disposition Plan: Patient is from home.  PT recommended CIR which has been denied by insurance.  Likely discharge to home with home health after improvement in the abdomen pain  Consultants: Orthopedics, cardiology  Procedures: ORIF  Antimicrobials:  Anti-infectives (From admission, onward)   Start     Dose/Rate Route Frequency Ordered Stop   01/01/20 2030  ceFAZolin (ANCEF) IVPB 1 g/50 mL premix  Status:  Discontinued  1 g 100 mL/hr over 30 Minutes Intravenous Every 8 hours 01/01/20 1307 01/03/20 1357   01/01/20 1315  ceFAZolin (ANCEF) IVPB 1 g/50 mL premix     1 g 100 mL/hr over 30 Minutes Intravenous NOW 01/01/20 1307  01/01/20 1454   01/01/20 0600  ceFAZolin (ANCEF) IVPB 2g/100 mL premix  Status:  Discontinued     2 g 200 mL/hr over 30 Minutes Intravenous To Short Stay 12/31/19 1928 01/01/20 1259   01/01/20 0600  vancomycin (VANCOCIN) IVPB 1000 mg/200 mL premix     1,000 mg 200 mL/hr over 60 Minutes Intravenous To Short Stay 12/31/19 1928 01/01/20 1055      Subjective:  Patient seen and examined at the bedside this morning.  Hemodynamically stable.  Complains of severe lower abdominal pain today.  Abdominal distended on examination.  She was also retaining her urine.  Discharge planning canceled  Objective: Vitals:   01/04/20 1300 01/04/20 1700 01/04/20 2047 01/05/20 0522  BP: 117/60 133/77 115/64 (!) 144/70  Pulse: 65 66 70 83  Resp: 15 18 16 16   Temp: 99.1 F (37.3 C) 98.3 F (36.8 C) 98.5 F (36.9 C) (!) 100.8 F (38.2 C)  TempSrc: Oral Oral Oral Oral  SpO2: 95% 92% 94% 90%  Weight:   70.8 kg   Height:        Intake/Output Summary (Last 24 hours) at 01/05/2020 0751 Last data filed at 01/05/2020 0601 Gross per 24 hour  Intake 780 ml  Output --  Net 780 ml   Filed Weights   12/30/19 2027 12/31/19 2115 01/04/20 2047  Weight: 77.1 kg 71.7 kg 70.8 kg    Examination:  General exam: Uncomfortable due to lower abdominal pain, elderly pleasant female HEENT:PERRL,Oral mucosa moist, Ear/Nose normal on gross exam Respiratory system: Bilateral equal air entry, normal vesicular breath sounds, no wheezes or crackles  Cardiovascular system: S1 & S2 heard, RRR. No JVD, murmurs, rubs, gallops or clicks. No pedal edema. Gastrointestinal system: Lower abdominal tenderness, normal bowel sounds  Central nervous system: Alert and oriented. No focal neurological deficits. Extremities: No edema, no clubbing ,no cyanosis, left upper extremity splint skin: No rashes, lesions or ulcers,no icterus ,no pallor    Data Reviewed: I have personally reviewed following labs and imaging studies  CBC: Recent  Labs  Lab 12/29/19 0943 12/29/19 0943 12/30/19 0448 12/31/19 0415 01/02/20 0410 01/03/20 0412 01/04/20 0509  WBC 15.1*  --  11.1* 10.9*  --   --   --   NEUTROABS 11.9*  --  7.7  --   --   --   --   HGB 12.2   < > 11.2* 11.3* 10.7* 10.8* 10.8*  HCT 38.3   < > 35.6* 35.9* 32.9* 34.2* 33.6*  MCV 95.8  --  96.5 97.0  --   --   --   PLT 251  --  231 233  --   --   --    < > = values in this interval not displayed.   Basic Metabolic Panel: Recent Labs  Lab 12/29/19 0943 12/30/19 0448 12/31/19 0415 01/02/20 0410 01/03/20 0412  NA 139 140 141 138 139  K 3.7 4.0 4.2 4.1 3.8  CL 107 108 106 103 102  CO2 24 24 27 27 27   GLUCOSE 119* 105* 125* 113* 108*  BUN 31* 18 12 14 17   CREATININE 1.64* 1.03* 0.86 0.81 0.92  CALCIUM 8.6* 8.4* 9.0 8.7* 8.8*   GFR: Estimated Creatinine Clearance: 35 mL/min (by  C-G formula based on SCr of 0.92 mg/dL). Liver Function Tests: No results for input(s): AST, ALT, ALKPHOS, BILITOT, PROT, ALBUMIN in the last 168 hours. No results for input(s): LIPASE, AMYLASE in the last 168 hours. No results for input(s): AMMONIA in the last 168 hours. Coagulation Profile: No results for input(s): INR, PROTIME in the last 168 hours. Cardiac Enzymes: No results for input(s): CKTOTAL, CKMB, CKMBINDEX, TROPONINI in the last 168 hours. BNP (last 3 results) No results for input(s): PROBNP in the last 8760 hours. HbA1C: No results for input(s): HGBA1C in the last 72 hours. CBG: Recent Labs  Lab 01/01/20 0650  GLUCAP 87   Lipid Profile: No results for input(s): CHOL, HDL, LDLCALC, TRIG, CHOLHDL, LDLDIRECT in the last 72 hours. Thyroid Function Tests: No results for input(s): TSH, T4TOTAL, FREET4, T3FREE, THYROIDAB in the last 72 hours. Anemia Panel: No results for input(s): VITAMINB12, FOLATE, FERRITIN, TIBC, IRON, RETICCTPCT in the last 72 hours. Sepsis Labs: No results for input(s): PROCALCITON, LATICACIDVEN in the last 168 hours.  Recent Results (from the  past 240 hour(s))  Respiratory Panel by RT PCR (Flu A&B, Covid) - Nasopharyngeal Swab     Status: None   Collection Time: 12/29/19 12:15 PM   Specimen: Nasopharyngeal Swab  Result Value Ref Range Status   SARS Coronavirus 2 by RT PCR NEGATIVE NEGATIVE Final    Comment: (NOTE) SARS-CoV-2 target nucleic acids are NOT DETECTED. The SARS-CoV-2 RNA is generally detectable in upper respiratoy specimens during the acute phase of infection. The lowest concentration of SARS-CoV-2 viral copies this assay can detect is 131 copies/mL. A negative result does not preclude SARS-Cov-2 infection and should not be used as the sole basis for treatment or other patient management decisions. A negative result may occur with  improper specimen collection/handling, submission of specimen other than nasopharyngeal swab, presence of viral mutation(s) within the areas targeted by this assay, and inadequate number of viral copies (<131 copies/mL). A negative result must be combined with clinical observations, patient history, and epidemiological information. The expected result is Negative. Fact Sheet for Patients:  PinkCheek.be Fact Sheet for Healthcare Providers:  GravelBags.it This test is not yet ap proved or cleared by the [place] FDA and  has been authorized for detection and/or diagnosis of SARS-CoV-2 by FDA under an Emergency Use Authorization (EUA). This EUA will remain  in effect (meaning this test can be used) for the duration of the COVID-19 declaration under Section 564(b)(1) of the Act, 21 U.S.C. section 360bbb-3(b)(1), unless the authorization is terminated or revoked sooner.    Influenza A by PCR NEGATIVE NEGATIVE Final   Influenza B by PCR NEGATIVE NEGATIVE Final    Comment: (NOTE) The Xpert Xpress SARS-CoV-2/FLU/RSV assay is intended as an aid in  the diagnosis of influenza from Nasopharyngeal swab specimens and  should not be  used as a sole basis for treatment. Nasal washings and  aspirates are unacceptable for Xpert Xpress SARS-CoV-2/FLU/RSV  testing. Fact Sheet for Patients: PinkCheek.be Fact Sheet for Healthcare Providers: GravelBags.it This test is not yet approved or cleared by the [place] FDA and  has been authorized for detection and/or diagnosis of SARS-CoV-2 by  FDA under an Emergency Use Authorization (EUA). This EUA will remain  in effect (meaning this test can be used) for the duration of the  Covid-19 declaration under Section 564(b)(1) of the Act, 21  U.S.C. section 360bbb-3(b)(1), unless the authorization is  terminated or revoked. Performed at [facility], [address] [place], [place] [address]   Surgical pcr screen     Status: None   Collection Time: 12/31/19  7:49 PM   Specimen: Nasal Mucosa; Nasal Swab  Result Value Ref Range Status   MRSA, PCR NEGATIVE NEGATIVE Final   Staphylococcus aureus NEGATIVE NEGATIVE Final    Comment: (NOTE) The Xpert SA Assay (FDA approved for NASAL specimens in patients 66 years of age and older), is one component of a comprehensive surveillance program. It is not intended to diagnose infection nor to guide or monitor treatment. Performed at [facility], [place] [address], [place], [place] [address]          Radiology Studies: No results found.      Scheduled Meds: . amLODipine  10 mg Oral Daily  . gabapentin  600 mg Oral QHS  . heparin  5,000 Units Subcutaneous Q8H  . levothyroxine  50 mcg Oral Q0600  . nadolol  80 mg Oral Daily  . pantoprazole  40 mg Oral Daily  . predniSONE  10 mg Oral Q breakfast  . saccharomyces boulardii  250 mg Oral BID  . sodium chloride flush  3 mL Intravenous Q12H   Continuous Infusions: . lactated ringers 10 mL/hr at 01/01/20 0920     LOS: 7 days    Time spent: 35 mins.More than 50% of that time was spent in counseling  and/or coordination of care.      Shelly Coss, MD [facility] P2/24/2021, 7:51 AM

## 2020-01-05 NOTE — Progress Notes (Signed)
Notified MD Adhikari that bladder scan was 235 ml and he gave verbal order for in and out cath. Pt voided 400 mL.    Paulla Fore, RN, BSN

## 2020-01-05 NOTE — TOC Initial Note (Addendum)
Transition of [facility]) - Initial/Assessment Note    Patient Details  Name: [name] MRN: [id number] Date of Birth: 12-Sep-1923  [facility] CM/SW Contact:    [name] Feil, LCSW Phone Number: 01/05/2020, 4:33 PM  Clinical Narrative:  CSW talked with patient and daughter [name] at the bedside regarding her discharge plan. [name] was sitting up in bed and was awake, alert, and actively engaged in the conversation regarding her d/c plan. CSW advised that the plan is home and per daughter-[name] she and her brother live close by.  [name] reported that she and her brothers will make a plan so that their mother is never alone.   Home health services discussed and patient/daughter advised re: Medicare.gov web site and that they will be provided with a list of [facility] agencies in their area.  When asked, [name] responded that she has a cane and walker and will need a bed-side commode. Patient also responded that she won't be able to use the walker due to her broken arm. CSW advised by daughter that her brother [name] [phone]) is looking into their mom's long-term care insurance and [name] has questions. CSW advised daughter that brother can call, although not sure if needed information can be provided.                Expected Discharge Plan: [place] Barriers to Discharge: Continued Medical Work up   Patient Goals and CMS Choice Patient states their goals for this hospitalization and ongoing recovery are:: Patient desires to discharge home once medically stable with family support and [facility] services CMS Medicare.gov Compare Post Acute Care list provided to:: Patient Represenative (must comment)(Provided patient/daughter with Medicare.gov HH list) Choice offered to / list presented to : Patient, Adult Children  Expected Discharge Plan and Services Expected Discharge Plan: [place] In-house Referral: Clinical Social  Work Discharge Planning Services: CM Consult   Living arrangements for the past 2 months: [place]                                     Prior Living Arrangements/Services Living arrangements for the past 2 months: Single Family Home Lives with:: Self(Per patient, her daughter lives very close by. Her sons also live close-by) Patient language and need for interpreter reviewed:: No Do you feel safe going back to the place where you live?: Yes      Need for Family Participation in Patient Care: Yes (Comment) Care giver support system in place?: Yes (comment) Current home services: (No current HH services) Criminal Activity/Legal Involvement Pertinent to Current Situation/Hospitalization: No - Comment as needed  Activities of Daily Living Home Assistive Devices/Equipment: Walker (specify type), Cane (specify quad or straight) ADL Screening (condition at time of admission) Patient's cognitive ability adequate to safely complete daily activities?: Yes Is the patient deaf or have difficulty hearing?: No Does the patient have difficulty seeing, even when wearing glasses/contacts?: No Does the patient have difficulty concentrating, remembering, or making decisions?: No Patient able to express need for assistance with ADLs?: Yes Does the patient have difficulty dressing or bathing?: No Independently performs ADLs?: Yes (appropriate for developmental age) Does the patient have difficulty walking or climbing stairs?: Yes Weakness of Legs: Left Weakness of Arms/Hands: Left  Permission Sought/Granted Permission sought to share information with : Family Supports(Patient agreeable to CSW talking with her  daughter and sonThomas regarding her discharge and services)    Share Information with NAME: [name] and [name]     Permission granted to share info w Relationship: Daughter and son  Permission granted to share info w Contact Information: Dau. [name] [id number];  Son [name] [id number]  Emotional Assessment Appearance:: Appears younger than stated age Attitude/Demeanor/Rapport: Engaged Affect (typically observed): Appropriate, Pleasant Orientation: : Oriented to Self, Oriented to Place, Oriented to  Time, Oriented to Situation Alcohol / Substance Use: Tobacco Use, Alcohol Use, Illicit Drugs(Per H&P patient reported that she has never smoked, and does not drink or use illict drugs) Psych Involvement: No (comment)  Admission diagnosis:  AKI (acute kidney injury) ([place]) [N17.9] Patient Active Problem List   Diagnosis Date Noted  . Acute renal failure superimposed on stage 3a chronic kidney disease ([place]) 12/29/2019  . Closed fracture of multiple ribs 12/29/2019  . Closed fracture of left distal radius 12/29/2019  . Recurrent falls 12/29/2019  . Positive ANA (antinuclear antibody) 12/29/2019  . Osteoarthritis 11/13/2017  . Hypertriglyceridemia 01/29/2017  . Excessive gas 01/09/2017  . Preoperative cardiovascular examination 07/31/2016  . Elevated lipase 03/06/2016  . Osteopenia 02/08/2016  . Hypothyroidism 01/26/2016  . HTN (hypertension) 01/26/2016  . Left leg numbness 10/06/2012   PCP:  [name] Minium, MD Pharmacy:   [id number] - [place], [place] - [address] [place] [address] Phone: [phone] Fax: [phone]     Social Determinants of Health (SDOH) Interventions  No SDOH interventions requested or needed at this time.  Readmission Risk Interventions No flowsheet data found.

## 2020-01-05 NOTE — Progress Notes (Signed)
[place] Notified MD Adhikari that pt was c/o abdominal pain and stating that she was unable to void. Bladder scan showed 272 mL and MD gave verbal order for in and out cath. Urine output was 450 mL.   Paulla Fore, RN, BSN

## 2020-01-05 NOTE — Progress Notes (Signed)
Pharmacy Antibiotic Note  [name] is a 86 y.o. female admitted on 12/29/2019 with intra-abdominal infection.  Pharmacy has been consulted for Zosyn dosing. SCr 0.92 stable. Noted "nausea/chest pain" allergy to Keflex documented; however, patient has tolerated cefazolin this admit.  Plan: Zosyn 3.375g IV (64min infusion) x1; then 3.375g IV q8h (4h infusion) Monitor clinical progress, c/s, renal function F/u de-escalation plan/LOT  Height: 5\' 6"  (167.6 cm) Weight: 156 lb 1.4 oz (70.8 kg) IBW/kg (Calculated) : 59.3  Temp (24hrs), Avg:99.6 F (37.6 C), Min:98.5 F (36.9 C), Max:100.8 F (38.2 C)  Recent Labs  Lab 12/30/19 0448 12/31/19 0415 01/02/20 0410 01/03/20 0412  WBC 11.1* 10.9*  --   --   CREATININE 1.03* 0.86 0.81 0.92    Estimated Creatinine Clearance: 35 mL/min (by C-G formula based on SCr of 0.92 mg/dL).    Allergies  Allergen Reactions  . Codeine Other (See Comments)    Nightmares, imagining things  . Keflex [Cephalexin] Nausea Only    Pt ended up in ER w/ CP    Elicia Lamp, PharmD, BCPS Please check AMION for all [place] contact numbers Clinical Pharmacist 01/05/2020 7:50 PM

## 2020-01-05 NOTE — Progress Notes (Signed)
OT Cancellation Note  Patient Details Name: [name] MRN: [id number] DOB: February 03, 1923   Cancelled Treatment:    Reason Eval/Treat Not Completed: Fatigue/lethargy limiting ability to participate;Patient declined, no reason specified Spoke to RN and patient, patient complaining of severe abdominal pain and not wanting to participate in therapy this morning. Will follow back as time permits.   Corinne Ports E. Jnai [place], COTA/L Acute Rehabilitation Services [phone] [place] 01/05/2020, 9:07 AM

## 2020-01-05 NOTE — Progress Notes (Signed)
Occupational Therapy Treatment Patient Details Name: [name] MRN: [id number] DOB: 1923-09-26 Today's Date: 01/05/2020    History of present illness 86 yo female presenting to [facility] for severe left wrist pain after fall.  Pt reports that she had been experiencing upset stomach and diarrhea for a few days, had been lightheaded upon standing, had a fall without any significant appreciable injury yesterday, and then fell again this morning onto her left arm. Xrays show radial fx and left lateral 8th and 9th rib fxs (possibly the 7th as well). PMH including is a HTN, hypothyroidism, polymyalgia rheumatica, and chronic kidney disease III.   OT comments  Patient continues to make steady progress towards goals in skilled OT session. Patient's session encompassed ADLs at EOB due to abdominal pain and pending CT scan. Pt with increased ability to demonstrate log rolling to come to EOB however required increased assistance in order to maintain sitting balance, often bracing with RUE. Therapist now recommending home health OT due to CIR denial, as pt has terrific family support. Will continue to follow acutely.    Follow Up Recommendations  Home health OT    Equipment Recommendations  3 in 1 bedside commode    Recommendations for Other Services      Precautions / Restrictions Precautions Precautions: Fall Precaution Comments: Sling for comfort. Benefits from wearing it during mobility Restrictions Weight Bearing Restrictions: Yes LUE Weight Bearing: Weight bearing as tolerated       Mobility Bed Mobility Overal bed mobility: Needs Assistance Bed Mobility: Rolling;Sidelying to Sit Rolling: Min assist Sidelying to sit: Min guard;Min assist Supine to sit: Min assist        Transfers                      Balance Overall balance assessment: Needs assistance Sitting-balance support: Feet supported;Single extremity supported Sitting balance-Leahy Scale: Poor Sitting balance  - Comments: Demonstrated increased difficulty maintaining sitting balance in session, often requiring bracing from RUE                                   ADL either performed or assessed with clinical judgement   ADL Overall ADL's : Needs assistance/impaired     Grooming: Set up;Oral care;Wash/dry face;Min guard;Sitting Grooming Details (indicate cue type and reason): Sitting EOB due to abdominal pain and CT test soon                             Functional mobility during ADLs: Minimal assistance General ADL Comments: Pt motivated despite pain, however ADLs kept at EOB due to abdominal pain and pending CT scan, pt with decreased balance EOB in session     Vision       Perception     Praxis      Cognition Arousal/Alertness: Awake/alert Behavior During Therapy: WFL for tasks assessed/performed Overall Cognitive Status: Within Functional Limits for tasks assessed                                          Exercises     Shoulder Instructions       General Comments      Pertinent Vitals/ Pain       Pain Assessment: Faces Faces Pain Scale: Hurts a little  bit Pain Location: Complaining of abdominal pain, however now "much better because I have a lot of pain medicine in me" Pain Descriptors / Indicators: Grimacing;Discomfort Pain Intervention(s): Limited activity within patient's tolerance;Monitored during session;Premedicated before session;Repositioned  Home Living                                          Prior Functioning/Environment              Frequency  Min 3X/week        Progress Toward Goals  OT Goals(current goals can now be found in the care plan section)  Progress towards OT goals: Progressing toward goals  Acute Rehab OT Goals Patient Stated Goal: Return to home and be independent again OT Goal Formulation: With patient Time For Goal Achievement: 01/17/20 Potential to Achieve Goals:  Good  Plan Discharge plan needs to be updated(CIR denied, HH OT now recommended)    Co-evaluation                 AM-PAC OT "6 Clicks" Daily Activity     Outcome Measure   Help from another person eating meals?: A Little Help from another person taking care of personal grooming?: A Little Help from another person toileting, which includes using toliet, bedpan, or urinal?: A Lot Help from another person bathing (including washing, rinsing, drying)?: A Lot Help from another person to put on and taking off regular upper body clothing?: A Lot Help from another person to put on and taking off regular lower body clothing?: A Lot 6 Click Score: 14    End of Session    OT Visit Diagnosis: Unsteadiness on feet (R26.81);Other abnormalities of gait and mobility (R26.89);Muscle weakness (generalized) (M62.81);Pain Pain - Right/Left: Left Pain - part of body: Arm   Activity Tolerance Patient tolerated treatment well   Patient Left in bed;with call bell/phone within reach;with family/visitor present   Nurse Communication Mobility status        Time: [id number] OT Time Calculation (min): 14 min  Charges: OT General Charges $OT Visit: 1 Visit OT Treatments $Self Care/Home Management : 8-22 mins  Corinne Ports E. Sutcliffe, [facility] [phone] [place] 01/05/2020, 1:47 PM

## 2020-01-05 NOTE — Significant Event (Signed)
The abdomen/pelvis with contrast showed sigmoid diverticulitis with perforation and pneumoperitoneum.  Discussed with general surgery, Rosendo Gros.  Started on Zosyn, kept n.p.o.

## 2020-01-06 ENCOUNTER — Encounter (HOSPITAL_COMMUNITY): Payer: Self-pay | Admitting: Family Medicine

## 2020-01-06 ENCOUNTER — Inpatient Hospital Stay (HOSPITAL_COMMUNITY): Payer: Medicare PPO | Admitting: Anesthesiology

## 2020-01-06 ENCOUNTER — Encounter (HOSPITAL_COMMUNITY)
Admission: EM | Disposition: A | Discharge status: Rehabilitation Facility | Payer: Self-pay | Source: Ambulatory Visit | Attending: Internal Medicine

## 2020-01-06 HISTORY — PX: COLOSTOMY: SHX63

## 2020-01-06 HISTORY — PX: LAPAROTOMY: SHX154

## 2020-01-06 LAB — COMPREHENSIVE METABOLIC PANEL
ALT: 13 U/L (ref 0–44)
AST: 14 U/L — ABNORMAL LOW (ref 15–41)
Albumin: 2.1 g/dL — ABNORMAL LOW (ref 3.5–5.0)
Alkaline Phosphatase: 64 U/L (ref 38–126)
Anion gap: 10 (ref 5–15)
BUN: 21 mg/dL (ref 8–23)
CO2: 27 mmol/L (ref 22–32)
Calcium: 8.4 mg/dL — ABNORMAL LOW (ref 8.9–10.3)
Chloride: 102 mmol/L (ref 98–111)
Creatinine, Ser: 1.44 mg/dL — ABNORMAL HIGH (ref 0.44–1.00)
GFR calc Af Amer: 36 mL/min — ABNORMAL LOW (ref >60)
GFR calc non Af Amer: 31 mL/min — ABNORMAL LOW (ref >60)
Glucose, Bld: 104 mg/dL — ABNORMAL HIGH (ref 70–99)
Potassium: 4.3 mmol/L (ref 3.5–5.1)
Sodium: 139 mmol/L (ref 135–145)
Total Bilirubin: 1.6 mg/dL — ABNORMAL HIGH (ref 0.3–1.2)
Total Protein: 4.9 g/dL — ABNORMAL LOW (ref 6.5–8.1)

## 2020-01-06 LAB — PROTIME-INR
INR: 1.2 (ref 0.8–1.2)
Prothrombin Time: 15.2 seconds (ref 11.4–15.2)

## 2020-01-06 LAB — GLUCOSE, CAPILLARY
Glucose-Capillary: 87 mg/dL (ref 70–99)
Glucose-Capillary: 88 mg/dL (ref 70–99)

## 2020-01-06 LAB — CBC WITH DIFFERENTIAL/PLATELET
Abs Immature Granulocytes: 0.07 10*3/uL (ref 0.00–0.07)
Basophils Absolute: 0 10*3/uL (ref 0.0–0.1)
Basophils Relative: 0 %
Eosinophils Absolute: 0 10*3/uL (ref 0.0–0.5)
Eosinophils Relative: 0 %
HCT: 32.6 % — ABNORMAL LOW (ref 36.0–46.0)
Hemoglobin: 10.3 g/dL — ABNORMAL LOW (ref 12.0–15.0)
Immature Granulocytes: 1 %
Lymphocytes Relative: 10 %
Lymphs Abs: 1.4 10*3/uL (ref 0.7–4.0)
MCH: 29.9 pg (ref 26.0–34.0)
MCHC: 31.6 g/dL (ref 30.0–36.0)
MCV: 94.8 fL (ref 80.0–100.0)
Monocytes Absolute: 0.4 10*3/uL (ref 0.1–1.0)
Monocytes Relative: 3 %
Neutro Abs: 12.3 10*3/uL — ABNORMAL HIGH (ref 1.7–7.7)
Neutrophils Relative %: 86 %
Platelets: 308 10*3/uL (ref 150–400)
RBC: 3.44 MIL/uL — ABNORMAL LOW (ref 3.87–5.11)
RDW: 15.4 % (ref 11.5–15.5)
WBC: 14.2 10*3/uL — ABNORMAL HIGH (ref 4.0–10.5)
nRBC: 0 % (ref 0.0–0.2)

## 2020-01-06 LAB — TYPE AND SCREEN
ABO/RH(D): O POS
Antibody Screen: NEGATIVE

## 2020-01-06 LAB — ABO/RH: ABO/RH(D): O POS

## 2020-01-06 SURGERY — LAPAROTOMY, EXPLORATORY
Anesthesia: General | Site: Abdomen

## 2020-01-06 MED ORDER — VASOPRESSIN 20 UNIT/ML IV SOLN
INTRAVENOUS | Status: DC | PRN
  Administered 2020-01-06: 1 [IU] via INTRAVENOUS

## 2020-01-06 MED ORDER — SUCCINYLCHOLINE CHLORIDE 200 MG/10ML IV SOSY
PREFILLED_SYRINGE | INTRAVENOUS | Status: AC
  Filled 2020-01-06: qty 10

## 2020-01-06 MED ORDER — ONDANSETRON HCL 4 MG/2ML IJ SOLN
INTRAMUSCULAR | Status: AC
  Filled 2020-01-06: qty 2

## 2020-01-06 MED ORDER — PHENYLEPHRINE 40 MCG/ML (10ML) SYRINGE FOR IV PUSH (FOR BLOOD PRESSURE SUPPORT)
PREFILLED_SYRINGE | INTRAVENOUS | Status: AC
  Filled 2020-01-06: qty 30

## 2020-01-06 MED ORDER — LIDOCAINE HCL (CARDIAC) PF 100 MG/5ML IV SOSY
PREFILLED_SYRINGE | INTRAVENOUS | Status: DC | PRN
  Administered 2020-01-06: 60 mg via INTRAVENOUS

## 2020-01-06 MED ORDER — FENTANYL CITRATE (PF) 100 MCG/2ML IJ SOLN
25.0000 ug | INTRAMUSCULAR | Status: DC | PRN
  Administered 2020-01-06 (×2): 25 ug via INTRAVENOUS

## 2020-01-06 MED ORDER — ROCURONIUM BROMIDE 10 MG/ML (PF) SYRINGE
PREFILLED_SYRINGE | INTRAVENOUS | Status: AC
  Filled 2020-01-06: qty 30

## 2020-01-06 MED ORDER — HEPARIN SODIUM (PORCINE) 5000 UNIT/ML IJ SOLN: 5000.0000 [IU] | Freq: Once | INTRAMUSCULAR | Status: DC

## 2020-01-06 MED ORDER — LIDOCAINE 2% (20 MG/ML) 5 ML SYRINGE
INTRAMUSCULAR | Status: AC
  Filled 2020-01-06: qty 5

## 2020-01-06 MED ORDER — PHENYLEPHRINE HCL (PRESSORS) 10 MG/ML IV SOLN
INTRAVENOUS | Status: DC | PRN
  Administered 2020-01-06: 120 ug via INTRAVENOUS
  Administered 2020-01-06: 160 ug via INTRAVENOUS
  Administered 2020-01-06 (×2): 80 ug via INTRAVENOUS
  Administered 2020-01-06: 160 ug via INTRAVENOUS

## 2020-01-06 MED ORDER — PROPOFOL 10 MG/ML IV BOLUS
INTRAVENOUS | Status: AC
  Filled 2020-01-06: qty 20

## 2020-01-06 MED ORDER — LACTATED RINGERS IV SOLN: INTRAVENOUS | Status: DC | PRN

## 2020-01-06 MED ORDER — BUPIVACAINE HCL (PF) 0.25 % IJ SOLN
INTRAMUSCULAR | Status: AC
  Filled 2020-01-06: qty 30

## 2020-01-06 MED ORDER — MORPHINE SULFATE (PF) 2 MG/ML IV SOLN
1.0000 mg | INTRAVENOUS | Status: DC | PRN
  Administered 2020-01-07: 1 mg via INTRAVENOUS
  Administered 2020-01-07 – 2020-01-09 (×4): 2 mg via INTRAVENOUS
  Filled 2020-01-06 (×5): qty 1

## 2020-01-06 MED ORDER — SODIUM CHLORIDE 0.9 % IR SOLN
Status: DC | PRN
  Administered 2020-01-06 (×3): 1000 mL

## 2020-01-06 MED ORDER — HEPARIN SODIUM (PORCINE) 5000 UNIT/ML IJ SOLN
5000.0000 [IU] | Freq: Three times a day (TID) | INTRAMUSCULAR | Status: DC
  Administered 2020-01-06 – 2020-01-07 (×2): 5000 [IU] via SUBCUTANEOUS
  Filled 2020-01-06 (×2): qty 1

## 2020-01-06 MED ORDER — LEVOTHYROXINE SODIUM 100 MCG/5ML IV SOLN
25.0000 ug | Freq: Every day | INTRAVENOUS | Status: DC
  Administered 2020-01-07 – 2020-01-08 (×2): 25 ug via INTRAVENOUS
  Filled 2020-01-06 (×3): qty 5

## 2020-01-06 MED ORDER — ALBUMIN HUMAN 5 % IV SOLN: INTRAVENOUS | Status: DC | PRN

## 2020-01-06 MED ORDER — PHENYLEPHRINE HCL-NACL 10-0.9 MG/250ML-% IV SOLN
INTRAVENOUS | Status: DC | PRN
  Administered 2020-01-06: 25 ug/min via INTRAVENOUS

## 2020-01-06 MED ORDER — FENTANYL CITRATE (PF) 100 MCG/2ML IJ SOLN
INTRAMUSCULAR | Status: AC
  Filled 2020-01-06: qty 2

## 2020-01-06 MED ORDER — BUPIVACAINE HCL 0.25 % IJ SOLN
INTRAMUSCULAR | Status: DC | PRN
  Administered 2020-01-06: 30 mL

## 2020-01-06 MED ORDER — LACTATED RINGERS IV SOLN: INTRAVENOUS | Status: DC

## 2020-01-06 MED ORDER — ROCURONIUM BROMIDE 100 MG/10ML IV SOLN
INTRAVENOUS | Status: DC | PRN
  Administered 2020-01-06: 10 mg via INTRAVENOUS
  Administered 2020-01-06: 40 mg via INTRAVENOUS

## 2020-01-06 MED ORDER — SODIUM CHLORIDE 0.9 % IV BOLUS
500.0000 mL | Freq: Once | INTRAVENOUS | Status: AC
  Administered 2020-01-06: 500 mL via INTRAVENOUS

## 2020-01-06 MED ORDER — SUCCINYLCHOLINE CHLORIDE 20 MG/ML IJ SOLN
INTRAMUSCULAR | Status: DC | PRN
  Administered 2020-01-06: 80 mg via INTRAVENOUS

## 2020-01-06 MED ORDER — SUGAMMADEX SODIUM 200 MG/2ML IV SOLN
INTRAVENOUS | Status: DC | PRN
  Administered 2020-01-06: 180 mg via INTRAVENOUS

## 2020-01-06 MED ORDER — PROPOFOL 10 MG/ML IV BOLUS
INTRAVENOUS | Status: DC | PRN
  Administered 2020-01-06: 100 mg via INTRAVENOUS

## 2020-01-06 MED ORDER — FENTANYL CITRATE (PF) 100 MCG/2ML IJ SOLN
INTRAMUSCULAR | Status: DC | PRN
  Administered 2020-01-06: 150 ug via INTRAVENOUS

## 2020-01-06 MED ORDER — BUPIVACAINE LIPOSOME 1.3 % IJ SUSP
INTRAMUSCULAR | Status: DC | PRN
  Administered 2020-01-06: 20 mL

## 2020-01-06 MED ORDER — ONDANSETRON HCL 4 MG/2ML IJ SOLN: 4.0000 mg | Freq: Once | INTRAMUSCULAR | Status: DC | PRN

## 2020-01-06 MED ORDER — PANTOPRAZOLE SODIUM 40 MG IV SOLR
40.0000 mg | Freq: Every day | INTRAVENOUS | Status: DC
  Administered 2020-01-06 – 2020-01-13 (×8): 40 mg via INTRAVENOUS
  Filled 2020-01-06 (×8): qty 40

## 2020-01-06 MED ORDER — BUPIVACAINE LIPOSOME 1.3 % IJ SUSP
20.0000 mL | Freq: Once | INTRAMUSCULAR | Status: DC
  Filled 2020-01-06: qty 20

## 2020-01-06 MED ORDER — DEXAMETHASONE SODIUM PHOSPHATE 10 MG/ML IJ SOLN
INTRAMUSCULAR | Status: AC
  Filled 2020-01-06: qty 2

## 2020-01-06 MED ORDER — FENTANYL CITRATE (PF) 250 MCG/5ML IJ SOLN
INTRAMUSCULAR | Status: AC
  Filled 2020-01-06: qty 5

## 2020-01-06 MED ORDER — DEXAMETHASONE SODIUM PHOSPHATE 4 MG/ML IJ SOLN
INTRAMUSCULAR | Status: DC | PRN
  Administered 2020-01-06: 4 mg via INTRAVENOUS

## 2020-01-06 MED ORDER — ONDANSETRON HCL 4 MG/2ML IJ SOLN
INTRAMUSCULAR | Status: DC | PRN
  Administered 2020-01-06: 4 mg via INTRAVENOUS

## 2020-01-06 MED ORDER — DEXAMETHASONE SODIUM PHOSPHATE 10 MG/ML IJ SOLN
INTRAMUSCULAR | Status: AC
  Filled 2020-01-06: qty 1

## 2020-01-06 SURGICAL SUPPLY — 64 items
APL PRP STRL LF DISP 70% ISPRP (MISCELLANEOUS) ×2
BLADE CLIPPER SURG (BLADE) ×2 IMPLANT
BNDG GAUZE ELAST 4 BULKY (GAUZE/BANDAGES/DRESSINGS) ×2 IMPLANT
CANISTER SUCT 3000ML PPV (MISCELLANEOUS) ×3 IMPLANT
CHLORAPREP W/TINT 26 (MISCELLANEOUS) ×3 IMPLANT
COVER MAYO STAND STRL (DRAPES) ×6 IMPLANT
COVER SURGICAL LIGHT HANDLE (MISCELLANEOUS) ×6 IMPLANT
COVER WAND RF STERILE (DRAPES) ×3 IMPLANT
DRAPE HALF SHEET 40X57 (DRAPES) ×3 IMPLANT
DRAPE UTILITY XL STRL (DRAPES) ×3 IMPLANT
DRAPE WARM FLUID 44X44 (DRAPES) ×3 IMPLANT
DRSG OPSITE POSTOP 4X10 (GAUZE/BANDAGES/DRESSINGS) IMPLANT
DRSG OPSITE POSTOP 4X8 (GAUZE/BANDAGES/DRESSINGS) IMPLANT
DRSG PAD ABDOMINAL 8X10 ST (GAUZE/BANDAGES/DRESSINGS) ×2 IMPLANT
ELECT BLADE 6.5 EXT (BLADE) ×3 IMPLANT
ELECT CAUTERY BLADE 6.4 (BLADE) ×4 IMPLANT
ELECT REM PT RETURN 9FT ADLT (ELECTROSURGICAL) ×3
ELECTRODE REM PT RTRN 9FT ADLT (ELECTROSURGICAL) ×2 IMPLANT
GAUZE SPONGE 2X2 8PLY STRL LF (GAUZE/BANDAGES/DRESSINGS) ×2 IMPLANT
GAUZE SPONGE 4X4 12PLY STRL (GAUZE/BANDAGES/DRESSINGS) ×2 IMPLANT
GEL ULTRASOUND 20GR AQUASONIC (MISCELLANEOUS) IMPLANT
GLOVE BIO SURGEON STRL SZ7.5 (GLOVE) ×2 IMPLANT
GLOVE BIOGEL M STRL SZ7.5 (GLOVE) ×6 IMPLANT
GLOVE INDICATOR 8.0 STRL GRN (GLOVE) ×11 IMPLANT
GOWN STRL REUS W/ TWL LRG LVL3 (GOWN DISPOSABLE) ×12 IMPLANT
GOWN STRL REUS W/TWL 2XL LVL3 (GOWN DISPOSABLE) ×6 IMPLANT
GOWN STRL REUS W/TWL LRG LVL3 (GOWN DISPOSABLE) ×18
HANDLE SUCTION POOLE (INSTRUMENTS) ×1 IMPLANT
KIT BASIN OR (CUSTOM PROCEDURE TRAY) ×3 IMPLANT
KIT OSTOMY DRAINABLE 2.75 STR (WOUND CARE) ×2 IMPLANT
KIT TURNOVER KIT B (KITS) ×3 IMPLANT
LIGASURE IMPACT 36 18CM CVD LR (INSTRUMENTS) ×2 IMPLANT
NS IRRIG 1000ML POUR BTL (IV SOLUTION) ×6 IMPLANT
PAD ARMBOARD 7.5X6 YLW CONV (MISCELLANEOUS) ×6 IMPLANT
PENCIL BUTTON HOLSTER BLD 10FT (ELECTRODE) ×2 IMPLANT
PENCIL SMOKE EVACUATOR (MISCELLANEOUS) ×4 IMPLANT
RETRACTOR WND ALEXIS 25 LRG (MISCELLANEOUS) ×1 IMPLANT
RTRCTR WOUND ALEXIS 25CM LRG (MISCELLANEOUS) ×3
SET IRRIG TUBING LAPAROSCOPIC (IRRIGATION / IRRIGATOR) ×2 IMPLANT
SET TUBE SMOKE EVAC HIGH FLOW (TUBING) ×3 IMPLANT
SLEEVE ENDOPATH XCEL 5M (ENDOMECHANICALS) ×3 IMPLANT
SPECIMEN JAR LARGE (MISCELLANEOUS) ×3 IMPLANT
SPONGE GAUZE 2X2 STER 10/PKG (GAUZE/BANDAGES/DRESSINGS) ×1
STAPLER CUT CVD 40MM GREEN (STAPLE) ×2 IMPLANT
STAPLER CUT RELOAD GREEN (STAPLE) ×2 IMPLANT
STAPLER VISISTAT 35W (STAPLE) ×3 IMPLANT
SUCTION POOLE HANDLE (INSTRUMENTS) ×3
SURGILUBE 2OZ TUBE FLIPTOP (MISCELLANEOUS) ×3 IMPLANT
SUT NOVA 1 T20/GS 25DT (SUTURE) ×10 IMPLANT
SUT PROLENE 2 0 CT2 30 (SUTURE) ×2 IMPLANT
SUT SILK 2 0 (SUTURE) ×3
SUT SILK 2 0 SH CR/8 (SUTURE) ×5 IMPLANT
SUT SILK 2-0 18XBRD TIE 12 (SUTURE) ×2 IMPLANT
SUT SILK 3 0 (SUTURE) ×3
SUT SILK 3 0 SH CR/8 (SUTURE) ×3 IMPLANT
SUT SILK 3-0 18XBRD TIE 12 (SUTURE) ×2 IMPLANT
SUT VIC AB 2-0 SH 18 (SUTURE) ×2 IMPLANT
SUT VIC AB 3-0 SH 18 (SUTURE) ×2 IMPLANT
SYR BULB IRRIGATION 50ML (SYRINGE) ×3 IMPLANT
TOWEL GREEN STERILE (TOWEL DISPOSABLE) ×6 IMPLANT
TRAY LAPAROSCOPIC MC (CUSTOM PROCEDURE TRAY) ×3 IMPLANT
TUBE CONNECTING 12X1/4 (SUCTIONS) ×6 IMPLANT
WATER STERILE IRR 1000ML POUR (IV SOLUTION) ×3 IMPLANT
YANKAUER SUCT BULB TIP NO VENT (SUCTIONS) ×6 IMPLANT

## 2020-01-06 NOTE — Progress Notes (Addendum)
Pt belongings taken to the PACU by charge RN. Son, Lucianne Lei, was notified.  Paulla Fore, RN, BSN.

## 2020-01-06 NOTE — Progress Notes (Addendum)
5 Days Post-Op  Subjective: CC: Abdominal pain Patient reports that her pain is improved from a 10/10 yesterday to a 7/10 this morning.  Her pain is mainly in her lower abdomen, greatest in the left lower quadrant.  She denies any nausea or vomiting.  She is no longer passing any flatus.  Patient noted to be febrile at 101 overnight.  BP soft this morning 90s/40's.  I had nursing staff take a manual blood pressure.  This was 98/50.  She has had a prior appendectomy and abdominal hysterectomy.   She reports that she wishes for her children to make the decision on her care.  ROS: See above, otherwise other systems negative   Objective: Vital signs in last 24 hours: Temp:  [98.6 F (37 C)-101 F (38.3 C)] 98.9 F (37.2 C) (02/25 0504) Pulse Rate:  [68-86] 68 (02/25 0504) Resp:  [16-18] 16 (02/25 0504) BP: (93-133)/(44-86) 93/47 (02/25 0504) SpO2:  [91 %-98 %] 95 % (02/25 0504) Weight:  [74.8 kg] 74.8 kg (02/24 2057) Last BM Date: 01/05/20  Intake/Output from previous day: 02/24 0701 - 02/25 0700 In: 1185.7 [P.O.:400; I.V.:685.6; IV Piggyback:100] Out: 1050 [Urine:1050] Intake/Output this shift: No intake/output data recorded.  PE: Gen:  Alert, NAD, pleasant Card:  RRR Pulm:  CTAB, no W/R/R, effort normal. On o2 Abd: Abdomen is soft. She has tenderness of the lower abdomen greatest in the LLQ with guarding. No rigidity. +BS Ext:  No LE edema. Left wrist splint  Psych: A&Ox3  Skin: no rashes noted, warm and dry  Lab Results:  Recent Labs    01/04/20 0509 01/06/20 0454  WBC  --  14.2*  HGB 10.8* 10.3*  HCT 33.6* 32.6*  PLT  --  308   BMET Recent Labs    01/06/20 0454  NA 139  K 4.3  CL 102  CO2 27  GLUCOSE 104*  BUN 21  CREATININE 1.44*  CALCIUM 8.4*   PT/INR Recent Labs    01/06/20 0454  LABPROT 15.2  INR 1.2   CMP     Component Value Date/Time   NA 139 01/06/2020 0454   K 4.3 01/06/2020 0454   CL 102 01/06/2020 0454   CO2 27 01/06/2020  0454   GLUCOSE 104 (H) 01/06/2020 0454   BUN 21 01/06/2020 0454   CREATININE 1.44 (H) 01/06/2020 0454   CREATININE 0.95 (H) 01/26/2016 1552   CALCIUM 8.4 (L) 01/06/2020 0454   PROT 4.9 (L) 01/06/2020 0454   ALBUMIN 2.1 (L) 01/06/2020 0454   AST 14 (L) 01/06/2020 0454   ALT 13 01/06/2020 0454   ALKPHOS 64 01/06/2020 0454   BILITOT 1.6 (H) 01/06/2020 0454   GFRNONAA 31 (L) 01/06/2020 0454   GFRAA 36 (L) 01/06/2020 0454   Lipase     Component Value Date/Time   LIPASE 25.0 03/06/2016 1123       Studies/Results: CT ABDOMEN PELVIS W CONTRAST  Result Date: 01/05/2020 CLINICAL DATA:  Abdominal pain, recent fall with known fractures EXAM: CT ABDOMEN AND PELVIS WITH CONTRAST TECHNIQUE: Multidetector CT imaging of the abdomen and pelvis was performed using the standard protocol following bolus administration of intravenous contrast. CONTRAST:  173mL OMNIPAQUE IOHEXOL 300 MG/ML  SOLN COMPARISON:  03/04/2016 FINDINGS: Lower chest: Limited imaging through the lung bases demonstrates minimal hypoventilatory changes. No effusion or pneumothorax. Hepatobiliary: No focal liver abnormality is seen. No gallstones, gallbladder wall thickening, or biliary dilatation. Pancreas: There is diffuse fatty atrophy of the pancreas. No acute abnormalities. Spleen:  No splenic injury or perisplenic hematoma. Adrenals/Urinary Tract: Stable right renal cortical cyst. Kidneys enhance normally and symmetrically. No urinary tract calculi or obstructive uropathy. The bladder is unremarkable. Adrenal glands are stable. Stomach/Bowel: Inflammatory changes surround the sigmoid colon consistent with acute diverticulitis. There is extensive pneumoperitoneum consistent with ruptured diverticulum. I do not see any fluid collection or abscess. No bowel obstruction or ileus. Vascular/Lymphatic: Stable atherosclerosis of the abdominal aorta and its branches. No critical stenosis. No pathologic adenopathy within the abdomen or pelvis.  Reproductive: Status post hysterectomy. No adnexal masses. Other: There is diffuse pneumoperitoneum consistent with perforated diverticulitis. Trace free fluid within the pelvis. No fluid collection or abscess. Musculoskeletal: Displaced posterolateral left seventh through ninth rib fractures are noted. Wedge compression deformity of the T11 vertebral body appears chronic, as there is no visible fracture line or paraspinal hematoma. Reconstructed images demonstrate no additional findings. IMPRESSION: 1. Perforated sigmoid diverticulitis with extensive pneumoperitoneum. No fluid collection or abscess. 2. Displaced posterolateral left seventh through ninth rib fractures. 3. Wedge compression deformity of the T11 vertebral body appears chronic, as there is no visible fracture line or paraspinal hematoma. 4. Aortic Atherosclerosis (ICD10-I70.0). These results were called by telephone at the time of interpretation on 01/05/2020 at 7:32 pm to provider Lang Snow, who verbally acknowledged these results. Electronically Signed   By: Randa Ngo M.D.   On: 01/05/2020 19:32    Anti-infectives: Anti-infectives (From admission, onward)   Start     Dose/Rate Route Frequency Ordered Stop   01/06/20 0230  piperacillin-tazobactam (ZOSYN) IVPB 3.375 g     3.375 g 12.5 mL/hr over 240 Minutes Intravenous Every 8 hours 01/05/20 1952     01/05/20 2000  piperacillin-tazobactam (ZOSYN) IVPB 3.375 g     3.375 g 100 mL/hr over 30 Minutes Intravenous  Once 01/05/20 1952 01/05/20 2049   01/01/20 2030  ceFAZolin (ANCEF) IVPB 1 g/50 mL premix  Status:  Discontinued     1 g 100 mL/hr over 30 Minutes Intravenous Every 8 hours 01/01/20 1307 01/03/20 1357   01/01/20 1315  ceFAZolin (ANCEF) IVPB 1 g/50 mL premix     1 g 100 mL/hr over 30 Minutes Intravenous NOW 01/01/20 1307 01/01/20 1454   01/01/20 0600  ceFAZolin (ANCEF) IVPB 2g/100 mL premix  Status:  Discontinued     2 g 200 mL/hr over 30 Minutes Intravenous To Short  Stay 12/31/19 1928 01/01/20 1259   01/01/20 0600  vancomycin (VANCOCIN) IVPB 1000 mg/200 mL premix     1,000 mg 200 mL/hr over 60 Minutes Intravenous To Short Stay 12/31/19 1928 01/01/20 1055       Assessment/Plan HTN Hypothyroidism Hx DVT in 2016 GERD PVD Recent fall w/ left wrist fx s/p L wrist repair by Dr. Caralyn Guile on 2/20 Polymyalgia rheumatica on PO Prednisone - hold prednisone if able  CKD w/ AKI  Perforated diverticulitis, pneumoperitoneum - Per Dr. Rosendo Gros note, he had discussion with patients children with decision to treat initially with trial of  IV abx and bowel rest for 12-24 hours with close monitoring of VS and abdominal exam.  - Patient with fever overnight. BP soft this AM. Will have nursing staff hold PO BP meds and give NS bolus (EF 50% 12/30/2019). Obtain type and screen. Hgb stable currently at 10.3. WBC 14.2. AKI on labs. Patient reports that her pain has improved, however she is still quite tender on exam with guarding in the LLQ. Dr. Redmond Pulling to evaluate. Further recommendations to follow. Continue IV abx and  bowel rest.   FEN - NPO, IVF, NS bolus  VTE - SCDs, Heparin subq ID - Zosyn 2/24 >>   LOS: 8 days    Jillyn Ledger , [facility] Surgery 01/06/2020, 7:43 AM Please see Amion for pager number during day hours 7:00am-4:30pm

## 2020-01-06 NOTE — Progress Notes (Signed)
Per Surgery to hold all PO meds. MD Adhikari notified via secure chat.   Paulla Fore, RN, BSN.

## 2020-01-06 NOTE — Transfer of Care (Signed)
Immediate Anesthesia Transfer of Care Note  Patient: [name]  Procedure(s) Performed: Exploratory Laparotomy, Open Sigmoid colectomy (N/A Abdomen) Colostomy (N/A Abdomen)  Patient Location: PACU  Anesthesia Type:General  Level of Consciousness: awake and alert   Airway & Oxygen Therapy: Patient Spontanous Breathing and Patient connected to face mask oxygen  Post-op Assessment: Report given to RN and Post -op Vital signs reviewed and stable  Post vital signs: Reviewed and stable  Last Vitals:  Vitals Value Taken Time  BP 120/66 01/06/20 1402  Temp 36.7 C 01/06/20 1402  Pulse 70 01/06/20 1405  Resp 15 01/06/20 1405  SpO2 99 % 01/06/20 1405  Vitals shown include unvalidated device data.  Last Pain:  Vitals:   01/06/20 0948  TempSrc: Oral  PainSc:       Patients Stated Pain Goal: 0 ([id number])  Complications: No apparent anesthesia complications

## 2020-01-06 NOTE — Op Note (Signed)
NAME: [name], [name] MEDICAL RECORD [id number] ACCOUNT [id number] DATE OF BIRTH:[birth year] FACILITY: [facility] LOCATION: [facility] PHYSICIAN:Shamarra Warda Ronnie Derby, MD  OPERATIVE REPORT  DATE OF PROCEDURE:  01/06/2020  PREOPERATIVE DIAGNOSIS:  Perforated sigmoid diverticulitis.  POSTOPERATIVE DIAGNOSIS:  Perforated sigmoid diverticulitis with purulent peritonitis.  PROCEDURES:   1.  Exploratory laparotomy.   2.  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  SURGEON:  Greer Pickerel, MD  ASSISTANT SURGEON:  Nadeen Landau, MD  - An assistant was requested due to the extreme and dense inflammation of the tissue in the abdomen and to help with tissue mobilization and retraction.  POSITION ASSISTANT:   Alferd Apa, PA-C  ANESTHESIA:  General.  ESTIMATED BLOOD LOSS:  50 mL.  LOCAL:  A combination of Marcaine and Exparel.  SPECIMENS:  Sigmoid colon, stitch marks at proximal margin.  FINDINGS:  The patient had purulent peritonitis.  There was a fair amount of pus in the pelvis, left lower quadrant and midabdomen.  There was injection/hyperemia of the peritoneum and already inflammatory purulent exudate on loops of bowel and the peritoneum.  INDICATIONS:  The patient is a pleasant 86 year old lady who was admitted a few days ago after sustaining a fall with an arm fracture.  She underwent surgical repair and then acutely yesterday, developed acute abdominal pain.  A CT scan was performed  which demonstrated pneumoperitoneum, concerning for findings of perforated diverticulitis.  At that time, she was afebrile with normal vital signs and the discussion with the on-call surgeon, the patient and her family, was to try antibiotics.  There was  free air, but no gross evidence of free fluid.  This morning on rounds, the patient started to develop some soft blood pressures.  Her creatinine had bumped to 1.4 and her white count was elevated to 14,000.  She was very tender on exam and so   therefore, I had a long discussion with her and her family, some of whom were on speaker phone, regarding risks and benefits of surgery as well as nonsurgical management.  That conversation is separately documented.  DESCRIPTION OF PROCEDURE:  The patient was maintained on subcutaneous heparin as she received a dose this morning.  She was brought to OR-1 urgently and placed supine on the operating room table.  Informed consent had been obtained.  ERAS protocol was  not utilized given the urgency of the intraabdominal problem.  I had initially planned to perhaps due part of it laparoscopically, but upon induction of anesthesia, the patient became acutely hypotensive requiring phenylephrine, so therefore, we decided  just to proceed with an open procedure.  Surgical timeout was performed.  The patient was on scheduled therapeutic antibiotics.  A lower midline incision was made through the skin and subcutaneous tissue, then Bovie electrocautery was used to further  divide and gain access to the abdomen.  The fascia was incised all the way down to the pubic bone and we ultimately extended it a little bit above the belly button.  A wound protector was placed.  There was purulent fluid throughout the lower abdomen and  the pelvis.  The peritoneum was injected.  The small bowel was packed up into the upper abdomen, along with the omentum.  The sigmoid colon was densely inflamed consistent with diverticulitis.  There was a fair amount of inflammatory rind in the left  lower quadrant on the peritoneum and in the pelvis.  The patient still has her tubes and ovaries, but no uterus.  The area of  perforation was found in the proximal sigmoid colon.  I decided to staple across the descending colon just above the area of  perforation.  A small window was made in the mesentery just next to the bowel lumen.  It was then stapled with a Contour stapler with a green load.  Then, we took down the mesentery in sequential  fashion, staying very close to the sigmoid colon with the  LigaSure device.  Some of the mesentery was oversewn with figure-of-eight 2-0 silk sutures.  The rectosigmoid junction was identified and I also made a small window in the mesentery just next to the upper rectum and stapled across it with another load of  the Contour stapler with a green load.  We took down the remaining mesentery, again staying close to the bowel wall.  The specimen was freed.  A stitch was placed on the proximal margin.  We irrigated the abdomen with 3 L of saline at this point.   Dr.  Dema Severin assisted in retraction of the tissues throughout the case.  At this point, I took down the lateral attachments of the descending colon with electrocautery, as well as with blunt dissection.  We had achieved enough mobilization of the proximal colon  to where we felt it would come through the abdominal wall without undue tension.  A circular skin defect was made in the left midabdomen overlying the mid rectus position with electrocautery.  Subcutaneous fat was excised somewhat.  We then encountered  the fascia and a cruciate incision was made.  The rectus muscle was then spread with a Kelly and the posterior fascia and peritoneum was incised with cautery.  The proximal colon was then delivered up through the fascial defect.  It was not under undue  tension.  It appeared viable.  I then irrigated the abdomen with one final liter of saline.  Two 2-0 Prolenes were placed on each end of the rectal stump.  The omentum was placed back over the small bowel.  We then closed the fascia in the midline with  multiple interrupted #1 Novafil sutures.  We then excised the staple line on the end colon and matured colostomy in typical fashion with multiple interrupted 3-0 Vicryl sutures.  A wet-to-dry dressing was placed over the midline incision, followed by an  ostomy appliance over the ostomy.  The ostomy was viable.  It was able to accommodate a finger.   All needle, instrument and sponge counts were correct x2.  There were no immediate complications.  The patient was taken to the recovery room in guarded, but  stable condition.  VN/NUANCE  D:01/06/2020 T:01/06/2020 JOB:[id number]

## 2020-01-06 NOTE — Anesthesia Procedure Notes (Signed)
Procedure Name: Intubation Date/Time: 01/06/2020 12:00 PM Performed by: Bryson Corona, CRNA Pre-anesthesia Checklist: Patient identified, Emergency Drugs available, Suction available and Patient being monitored Patient Re-evaluated:Patient Re-evaluated prior to induction Oxygen Delivery Method: Circle System Utilized Preoxygenation: Pre-oxygenation with 100% oxygen Induction Type: IV induction and Rapid sequence Laryngoscope Size: Mac and 3 Grade View: Grade I Tube type: Oral Number of attempts: 1 Airway Equipment and Method: Stylet and Oral airway Placement Confirmation: ETT inserted through vocal cords under direct vision,  positive ETCO2 and breath sounds checked- equal and bilateral Secured at: 22 cm Tube secured with: Tape Dental Injury: Teeth and Oropharynx as per pre-operative assessment

## 2020-01-06 NOTE — Plan of Care (Signed)
  Problem: Education: Goal: Knowledge of disease and its progression will improve Outcome: Progressing   Problem: Health Behavior/Discharge Planning: Goal: Ability to manage health-related needs will improve Outcome: Progressing   Problem: Clinical Measurements: Goal: Complications related to the disease process or treatment will be avoided or minimized Outcome: Progressing   Problem: Activity: Goal: Activity intolerance will improve Outcome: Progressing   Problem: Nutritional: Goal: Ability to make appropriate dietary choices will improve Outcome: Progressing   Problem: Respiratory: Goal: Respiratory symptoms related to disease process will be avoided Outcome: Progressing

## 2020-01-06 NOTE — Consult Note (Addendum)
WOC consult requested for new colostomy.  Pt had surgery today and is still in the peri-operative setting.  Our team will follow tomorrow for first post-op pouch change and teaching session. Julien Girt MSN, RN, [place], [place], [place]

## 2020-01-06 NOTE — Progress Notes (Signed)
PROGRESS NOTE    [name]  [id number] DOB: 03-Jul-1923 DOA: 12/29/2019 PCP: [name], MD   Brief Narrative:  Patient is a 86 year old female with history of hypertension, hypothyroidism, polymyalgia rheumatica, CKD 3A who presented initially to [facility] for the evaluation of severe left wrist pain after a fall.  She was also having abdominal pain, diarrhea for last few days, lightheaded upon standing and fell multiple times.  When she presented, she was found to have AKI, distal left radius fracture.  She also had left lateral rib fractures.  Orthopedics was consulted and she underwent ORIF of left radial fracture.  Physical therapy recommended CIR but insurance denied.  We were planning for discharging to home with home health.  On 01/04/2019 1 AM, she developed severe lower abdominal pain.  CT abdomen/pelvis showed perforated sigmoid diverticulitis with pneumoperitoneum.  General surgery consulted.  After discussion with family, general surgery initially decided for conservative management.  Today, she had worsening leukocytosis, she was mildly hypotensive and developed AKI.  General surgery decided for exploratory laparotomy.  Assessment & Plan:   Principal Problem:   Acute renal failure superimposed on stage 3a chronic kidney disease (HCC) Active Problems:   Hypothyroidism   HTN (hypertension)   Preoperative cardiovascular examination   Closed fracture of multiple ribs   Closed fracture of left distal radius   Recurrent falls   Perforated sigmoid diverticulitis/pneumoperitoneum:General surgery consulted.   After discussion with family, general surgery initially decided for conservative management.  Today, she had worsening leukocytosis, she was mildly hypotensive and developed AKI.  General surgery decided for exploratory laparotomy. Continue Zosyn, IV fluids  AKI on CKD stage IIIa: Continue IV fluids, continue to monitor BMP  Acute hypoxic respiratory  failure: Most likely secondary to pain/atelectasis from rib fractures.  Continue incentive spirometry.  Chest x-rays did not show any acute infection but showed atelectasis of left lung base.  Currently on room air  Distal left radius fracture: Secondary to fall.  X-ray showed oblique fracture of the distal radius at the proximal end of the plate and screws in place for fixation of remote healed fracture.  Currently in sling.  Status post ORIF by orthopedics.  Removal of left distal radius hardware deep implants.  Orthopedics recommended not weightbearing of left upper extremity .  Follow-up recommended in 2 weeks.  Continue pain management. PT/OT recommended CIR but insurance denied.    Lateral left rib fractures: Fracture of lateral eighth, ninth ribs on chest x-ray.  Continue pain management, incentive spirometry  Polymyalgia rheumatica: Continue home dose of prednisone  Hypertension: Blood pressure soft.  BP medications held  Hypothyroidism: Continue Synthyroid  Dizziness/fall: Suspected secondary to dehydration.  Echocardiogram showed ejection fraction [id number], grade 1 diastolic dysfunction.  Orthostatic vitals were negative echo also showed regional wall motion abnormalities, apical septal and anterior hypokinesis.  Cardiology was consulted, no further work-up needed.  Normocytic anemia: Hemoglobin stable.  Continue to monitor.           DVT prophylaxis:Heparin [place] Code Status: Full Family Communication: Discussed with son at bedside on 01/06/2020  disposition Plan: Patient is from home.  PT recommended CIR which has been denied by insurance.  Anticipate prolonged hospitalization due to recent finding of pneumoperitoneum, perforated viscus.  Undergoing surgery today .needs PT/OT evaluation before discharge  Consultants: Orthopedics, cardiology  Procedures: ORIF  Antimicrobials:  Anti-infectives (From admission, onward)   Start     Dose/Rate Route Frequency Ordered Stop   01/06/20  0230  piperacillin-tazobactam (ZOSYN) IVPB 3.375 g     3.375 g 12.5 mL/hr over 240 Minutes Intravenous Every 8 hours 01/05/20 1952     01/05/20 2000  piperacillin-tazobactam (ZOSYN) IVPB 3.375 g     3.375 g 100 mL/hr over 30 Minutes Intravenous  Once 01/05/20 1952 01/05/20 2049   01/01/20 2030  ceFAZolin (ANCEF) IVPB 1 g/50 mL premix  Status:  Discontinued     1 g 100 mL/hr over 30 Minutes Intravenous Every 8 hours 01/01/20 1307 01/03/20 1357   01/01/20 1315  ceFAZolin (ANCEF) IVPB 1 g/50 mL premix     1 g 100 mL/hr over 30 Minutes Intravenous NOW 01/01/20 1307 01/01/20 1454   01/01/20 0600  ceFAZolin (ANCEF) IVPB 2g/100 mL premix  Status:  Discontinued     2 g 200 mL/hr over 30 Minutes Intravenous To Short Stay 12/31/19 1928 01/01/20 1259   01/01/20 0600  vancomycin (VANCOCIN) IVPB 1000 mg/200 mL premix     1,000 mg 200 mL/hr over 60 Minutes Intravenous To Short Stay 12/31/19 1928 01/01/20 1055      Subjective:  Patient seen and examined at the bedside this morning.  She reported that her abdomen pain is better today.  Abdomen was severely tender .  Son was at the bedside.  Blood pressure little soft  Objective: Vitals:   01/06/20 0120 01/06/20 0331 01/06/20 0504 01/06/20 0752  BP: (!) 94/46 133/86 (!) 93/47 (!) 98/50  Pulse: 78 70 68   Resp: 16 16 16    Temp: 100.2 F (37.9 C) 98.6 F (37 C) 98.9 F (37.2 C)   TempSrc: Oral Oral Oral   SpO2: 97% 97% 95%   Weight:      Height:        Intake/Output Summary (Last 24 hours) at 01/06/2020 [id number] Last data filed at 01/06/2020 0601 Gross per 24 hour  Intake 1185.68 ml  Output 1050 ml  Net 135.68 ml   Filed Weights   12/31/19 2115 01/04/20 2047 01/05/20 2057  Weight: 71.7 kg 70.8 kg 74.8 kg    Examination:  General exam: Pleasant elderly female, not in distress Respiratory system: Bilateral equal air entry, normal vesicular breath sounds, no wheezes or crackles  Cardiovascular system: S1 & S2 heard, RRR. No JVD, murmurs,  rubs, gallops or clicks. Gastrointestinal system: Abdomen is nondistended, soft.Severe generalized tenderness, rebound tenderness , slow bowel sounds, passing gas Central nervous system: Alert and oriented. No focal neurological deficits. Extremities: No edema, no clubbing ,no cyanosis, left upper extremity splint  skin: No rashes, lesions or ulcers,no icterus ,no pallor   Data Reviewed: I have personally reviewed following labs and imaging studies  CBC: Recent Labs  Lab 12/31/19 0415 01/02/20 0410 01/03/20 0412 01/04/20 0509 01/06/20 0454  WBC 10.9*  --   --   --  14.2*  NEUTROABS  --   --   --   --  12.3*  HGB 11.3* 10.7* 10.8* 10.8* 10.3*  HCT 35.9* 32.9* 34.2* 33.6* 32.6*  MCV 97.0  --   --   --  94.8  PLT 233  --   --   --  [id number]   Basic Metabolic Panel: Recent Labs  Lab 12/31/19 0415 01/02/20 0410 01/03/20 0412 01/06/20 0454  NA 141 138 139 139  K 4.2 4.1 3.8 4.3  CL 106 103 102 102  CO2 27 27 27 27   GLUCOSE 125* 113* 108* 104*  BUN 12 14 17 21   CREATININE 0.86 0.81 0.92 1.44*  CALCIUM  9.0 8.7* 8.8* 8.4*   GFR: Estimated Creatinine Clearance: 24.7 mL/min (A) (by C-G formula based on SCr of 1.44 mg/dL (H)). Liver Function Tests: Recent Labs  Lab 01/06/20 0454  AST 14*  ALT 13  ALKPHOS 64  BILITOT 1.6*  PROT 4.9*  ALBUMIN 2.1*   No results for input(s): LIPASE, AMYLASE in the last 168 hours. No results for input(s): AMMONIA in the last 168 hours. Coagulation Profile: Recent Labs  Lab 01/06/20 0454  INR 1.2   Cardiac Enzymes: No results for input(s): CKTOTAL, CKMB, CKMBINDEX, TROPONINI in the last 168 hours. BNP (last 3 results) No results for input(s): PROBNP in the last 8760 hours. HbA1C: No results for input(s): HGBA1C in the last 72 hours. CBG: Recent Labs  Lab 01/01/20 0650  GLUCAP 87   Lipid Profile: No results for input(s): CHOL, HDL, LDLCALC, TRIG, CHOLHDL, LDLDIRECT in the last 72 hours. Thyroid Function Tests: No results for  input(s): TSH, T4TOTAL, FREET4, T3FREE, THYROIDAB in the last 72 hours. Anemia Panel: No results for input(s): VITAMINB12, FOLATE, FERRITIN, TIBC, IRON, RETICCTPCT in the last 72 hours. Sepsis Labs: No results for input(s): PROCALCITON, LATICACIDVEN in the last 168 hours.  Recent Results (from the past 240 hour(s))  Respiratory Panel by RT PCR (Flu A&B, Covid) - Nasopharyngeal Swab     Status: None   Collection Time: 12/29/19 12:15 PM   Specimen: Nasopharyngeal Swab  Result Value Ref Range Status   SARS Coronavirus 2 by RT PCR NEGATIVE NEGATIVE Final    Comment: (NOTE) SARS-CoV-2 target nucleic acids are NOT DETECTED. The SARS-CoV-2 RNA is generally detectable in upper respiratoy specimens during the acute phase of infection. The lowest concentration of SARS-CoV-2 viral copies this assay can detect is 131 copies/mL. A negative result does not preclude SARS-Cov-2 infection and should not be used as the sole basis for treatment or other patient management decisions. A negative result may occur with  improper specimen collection/handling, submission of specimen other than nasopharyngeal swab, presence of viral mutation(s) within the areas targeted by this assay, and inadequate number of viral copies (<131 copies/mL). A negative result must be combined with clinical observations, patient history, and epidemiological information. The expected result is Negative. Fact Sheet for Patients:  PinkCheek.be Fact Sheet for Healthcare Providers:  GravelBags.it This test is not yet ap proved or cleared by the [place] FDA and  has been authorized for detection and/or diagnosis of SARS-CoV-2 by FDA under an Emergency Use Authorization (EUA). This EUA will remain  in effect (meaning this test can be used) for the duration of the COVID-19 declaration under Section 564(b)(1) of the Act, 21 U.S.C. section 360bbb-3(b)(1), unless the  authorization is terminated or revoked sooner.    Influenza A by PCR NEGATIVE NEGATIVE Final   Influenza B by PCR NEGATIVE NEGATIVE Final    Comment: (NOTE) The Xpert Xpress SARS-CoV-2/FLU/RSV assay is intended as an aid in  the diagnosis of influenza from Nasopharyngeal swab specimens and  should not be used as a sole basis for treatment. Nasal washings and  aspirates are unacceptable for Xpert Xpress SARS-CoV-2/FLU/RSV  testing. Fact Sheet for Patients: PinkCheek.be Fact Sheet for Healthcare Providers: GravelBags.it This test is not yet approved or cleared by the [place] FDA and  has been authorized for detection and/or diagnosis of SARS-CoV-2 by  FDA under an Emergency Use Authorization (EUA). This EUA will remain  in effect (meaning this test can be used) for the duration of the  Covid-19 declaration under Section  564(b)(1) of the Act, 21  U.S.C. section 360bbb-3(b)(1), unless the authorization is  terminated or revoked. Performed at [facility], [address] [place], [place] [address]   Surgical pcr screen     Status: None   Collection Time: 12/31/19  7:49 PM   Specimen: Nasal Mucosa; Nasal Swab  Result Value Ref Range Status   MRSA, PCR NEGATIVE NEGATIVE Final   Staphylococcus aureus NEGATIVE NEGATIVE Final    Comment: (NOTE) The Xpert SA Assay (FDA approved for NASAL specimens in patients 92 years of age and older), is one component of a comprehensive surveillance program. It is not intended to diagnose infection nor to guide or monitor treatment. Performed at [facility], [place] [address] [place], [place] [address]          Radiology Studies: CT ABDOMEN PELVIS W CONTRAST  Result Date: 01/05/2020 CLINICAL DATA:  Abdominal pain, recent fall with known fractures EXAM: CT ABDOMEN AND PELVIS WITH CONTRAST TECHNIQUE: Multidetector CT imaging of the abdomen and pelvis was performed  using the standard protocol following bolus administration of intravenous contrast. CONTRAST:  128mL OMNIPAQUE IOHEXOL 300 MG/ML  SOLN COMPARISON:  03/04/2016 FINDINGS: Lower chest: Limited imaging through the lung bases demonstrates minimal hypoventilatory changes. No effusion or pneumothorax. Hepatobiliary: No focal liver abnormality is seen. No gallstones, gallbladder wall thickening, or biliary dilatation. Pancreas: There is diffuse fatty atrophy of the pancreas. No acute abnormalities. Spleen: No splenic injury or perisplenic hematoma. Adrenals/Urinary Tract: Stable right renal cortical cyst. Kidneys enhance normally and symmetrically. No urinary tract calculi or obstructive uropathy. The bladder is unremarkable. Adrenal glands are stable. Stomach/Bowel: Inflammatory changes surround the sigmoid colon consistent with acute diverticulitis. There is extensive pneumoperitoneum consistent with ruptured diverticulum. I do not see any fluid collection or abscess. No bowel obstruction or ileus. Vascular/Lymphatic: Stable atherosclerosis of the abdominal aorta and its branches. No critical stenosis. No pathologic adenopathy within the abdomen or pelvis. Reproductive: Status post hysterectomy. No adnexal masses. Other: There is diffuse pneumoperitoneum consistent with perforated diverticulitis. Trace free fluid within the pelvis. No fluid collection or abscess. Musculoskeletal: Displaced posterolateral left seventh through ninth rib fractures are noted. Wedge compression deformity of the T11 vertebral body appears chronic, as there is no visible fracture line or paraspinal hematoma. Reconstructed images demonstrate no additional findings. IMPRESSION: 1. Perforated sigmoid diverticulitis with extensive pneumoperitoneum. No fluid collection or abscess. 2. Displaced posterolateral left seventh through ninth rib fractures. 3. Wedge compression deformity of the T11 vertebral body appears chronic, as there is no visible  fracture line or paraspinal hematoma. 4. Aortic Atherosclerosis (ICD10-I70.0). These results were called by telephone at the time of interpretation on 01/05/2020 at 7:32 pm to provider Lang Snow, who verbally acknowledged these results. Electronically Signed   By: Randa Ngo M.D.   On: 01/05/2020 19:32        Scheduled Meds: . amLODipine  10 mg Oral Daily  . Chlorhexidine Gluconate Cloth  6 each Topical Daily  . dicyclomine  20 mg Oral TID AC & HS  . gabapentin  600 mg Oral QHS  . heparin  5,000 Units Subcutaneous Q8H  . levothyroxine  50 mcg Oral Q0600  . nadolol  80 mg Oral Daily  . pantoprazole  40 mg Oral Daily  . polyethylene glycol  17 g Oral Daily  . predniSONE  10 mg Oral Q breakfast  . saccharomyces boulardii  250 mg Oral BID  . sodium chloride flush  3 mL Intravenous  Q12H   Continuous Infusions: . sodium chloride 125 mL/hr at 01/05/20 2001  . lactated ringers 10 mL/hr at 01/01/20 0920  . piperacillin-tazobactam (ZOSYN)  IV 3.375 g (01/06/20 0135)     LOS: 8 days    Time spent: 35 mins.More than 50% of that time was spent in counseling and/or coordination of care.      Shelly Coss, MD [facility] P2/25/2021, 8:14 AM

## 2020-01-06 NOTE — Anesthesia Postprocedure Evaluation (Signed)
Anesthesia Post Note  Patient: [name]  Procedure(s) Performed: Exploratory Laparotomy, Open Sigmoid colectomy (N/A Abdomen) Colostomy (N/A Abdomen)     Patient location during evaluation: PACU Anesthesia Type: General Level of consciousness: awake Pain management: pain level controlled Vital Signs Assessment: post-procedure vital signs reviewed and stable Respiratory status: spontaneous breathing, nonlabored ventilation, respiratory function stable and patient connected to nasal cannula oxygen Cardiovascular status: blood pressure returned to baseline and stable Postop Assessment: no apparent nausea or vomiting Anesthetic complications: no    Last Vitals:  Vitals:   01/06/20 1441 01/06/20 1536  BP:    Pulse: 72   Resp: 20   Temp:  36.7 C  SpO2: 98%     Last Pain:  Vitals:   01/06/20 1536  TempSrc: Oral  PainSc:                  [name]

## 2020-01-06 NOTE — TOC Progression Note (Signed)
Transition of Care [facility]) - Progression Note    Patient Details  Name: [name] MRN: [id number] Date of Birth: [birth year]  [facility]) CM/SW Contact  Bartholomew Crews, RN Phone Number: [phone] 01/06/2020, 11:38 AM  Clinical Narrative:    Received phone call from [facility] who is discharge planner RN at [facility]. Stated that she had received received a referral to assist son with discharge needs. NCM updated Megan on patient condition overnight. NCM provided contact information. Megan's contact information is [phone]. Patient in [place] now with orders to transition to progressive care. TOC team following.    Expected Discharge Plan: [place] Barriers to Discharge: Continued Medical Work up  Expected Discharge Plan and Services Expected Discharge Plan: [place] In-house Referral: Clinical Social Work Discharge Planning Services: CM Consult   Living arrangements for the past 2 months: Single Family Home                                       Social Determinants of Health (SDOH) Interventions    Readmission Risk Interventions No flowsheet data found.

## 2020-01-06 NOTE — Anesthesia Preprocedure Evaluation (Addendum)
Anesthesia Evaluation  Patient identified by MRN, date of birth, ID band Patient awake    Reviewed: Allergy & Precautions, NPO status , Patient's Chart, lab work & pertinent test results  Airway Mallampati: II  TM Distance: >3 FB Neck ROM: Full    Dental no notable dental hx.    Pulmonary neg pulmonary ROS,    Pulmonary exam normal breath sounds clear to auscultation       Cardiovascular hypertension, Pt. on medications and Pt. on home beta blockers + Peripheral Vascular Disease and + DVT  Normal cardiovascular exam Rhythm:Regular Rate:Normal  ECG: SB  ECHO: 1. Left ventricular ejection fraction, by estimation, is 50%. The left ventricle has mildly decreased function. The left ventricle demonstrates regional wall motion abnormalities. Apical septal and apical anterior hypokinesis noted. Left ventricular diastolic parameters are consistent with Grade I diastolic dysfunction (impaired relaxation). 2. Right ventricular systolic function is normal. The right ventricular size is normal. Tricuspid regurgitation signal is inadequate for assessing PA pressure. 3. The mitral valve is normal in structure and function. Trivial mitral valve regurgitation. No evidence of mitral stenosis. 4. The aortic valve is tricuspid. Aortic valve regurgitation is not visualized. Mild aortic valve sclerosis is present, with no evidence of aortic valve stenosis. 5. The inferior vena cava is normal in size with greater than 50% respiratory variability, suggesting right atrial pressure of 3 mmHg.   Neuro/Psych negative neurological ROS  negative psych ROS   GI/Hepatic Neg liver ROS, GERD  Medicated and Controlled,  Endo/Other  Hypothyroidism   Renal/GU Renal disease     Musculoskeletal  (+) Arthritis ,   Abdominal   Peds  Hematology  (+) anemia ,   Anesthesia Other Findings perforated diverticulitis  Reproductive/Obstetrics                             Anesthesia Physical Anesthesia Plan  ASA: III  Anesthesia Plan: General   Post-op Pain Management:    Induction: Intravenous  PONV Risk Score and Plan: 4 or greater and Ondansetron, Dexamethasone and Treatment may vary due to age or medical condition  Airway Management Planned: Oral ETT  Additional Equipment:   Intra-op Plan:   Post-operative Plan: Extubation in OR and Possible Post-op intubation/ventilation  Informed Consent: I have reviewed the patients History and Physical, chart, labs and discussed the procedure including the risks, benefits and alternatives for the proposed anesthesia with the patient or authorized representative who has indicated his/her understanding and acceptance.     Dental advisory given  Plan Discussed with: CRNA  Anesthesia Plan Comments: (Potential central line placement discussed Potential arterial line placement discussed Anesthetic plan discussed with patient and son)       Anesthesia Quick Evaluation

## 2020-01-06 NOTE — Progress Notes (Signed)
Pt is in surgery for placement of ostomy, will reassess when done.  Note states she may be in IC unit after, will need new order for PT when ready if she is in ICU.     01/06/20 1100  PT Visit Information  Last PT Received On 01/06/20  Reason Eval/Treat Not Completed Patient at procedure or test/unavailable;Patient not medically ready    Mee Hives, PT MS Acute Rehab Dept. Number: [place] [facility] [place]

## 2020-01-06 NOTE — Brief Op Note (Signed)
01/06/2020  2:26 PM  PATIENT:  [name]  86 y.o. female  PRE-OPERATIVE DIAGNOSIS:  Perforated sigmoid diverticulitis  POST-OPERATIVE DIAGNOSIS:  perforated sigmoid diverticulitis with purulent peritonitis  PROCEDURE:  Procedure(s): Exploratory Laparotomy, Open Sigmoid colectomy (N/A) Colostomy (N/A) - Hartmann's procedure  SURGEON:  Surgeon(s) and Role:    Greer Pickerel, MD - Primary  PHYSICIAN ASSISTANT: Alferd Apa PA-C  ASSISTANTS: Annye English MD    ANESTHESIA:   general  EBL:  50 mL   BLOOD ADMINISTERED:none  DRAINS: Urinary Catheter (Foley)   LOCAL MEDICATIONS USED:  MARCAINE    and OTHER exparel  SPECIMEN:  Source of Specimen:  sigmoid colon, stitch proximal margin  DISPOSITION OF SPECIMEN:  PATHOLOGY  COUNTS:  YES  TOURNIQUET:  * No tourniquets in log *  DICTATION: .Other Dictation: Dictation Number [id number]  PLAN OF CARE: Admit to inpatient   PATIENT DISPOSITION:  PACU - guarded condition.   Delay start of Pharmacological VTE agent (>24hrs) due to surgical blood loss or risk of bleeding: no

## 2020-01-06 NOTE — Progress Notes (Signed)
Pt. with several belongings at bedside including clothes, shoes, glasses, and cell phone. Pt. requests son take them home.   1900: Son, Lucianne Lei, now at bedside and states he will be taking pt.'s belongings home with him. Pt. to be transported to 2w02 by the night team and is aware the three bags of patient belongings will be going with son, who is currently at bedside.

## 2020-01-07 LAB — CBC WITH DIFFERENTIAL/PLATELET
Abs Immature Granulocytes: 0.12 10*3/uL — ABNORMAL HIGH (ref 0.00–0.07)
Basophils Absolute: 0 10*3/uL (ref 0.0–0.1)
Basophils Relative: 0 %
Eosinophils Absolute: 0 10*3/uL (ref 0.0–0.5)
Eosinophils Relative: 0 %
HCT: 29.3 % — ABNORMAL LOW (ref 36.0–46.0)
Hemoglobin: 9.3 g/dL — ABNORMAL LOW (ref 12.0–15.0)
Immature Granulocytes: 1 %
Lymphocytes Relative: 6 %
Lymphs Abs: 0.8 10*3/uL (ref 0.7–4.0)
MCH: 30.3 pg (ref 26.0–34.0)
MCHC: 31.7 g/dL (ref 30.0–36.0)
MCV: 95.4 fL (ref 80.0–100.0)
Monocytes Absolute: 0.5 10*3/uL (ref 0.1–1.0)
Monocytes Relative: 4 %
Neutro Abs: 12.3 10*3/uL — ABNORMAL HIGH (ref 1.7–7.7)
Neutrophils Relative %: 89 %
Platelets: 305 10*3/uL (ref 150–400)
RBC: 3.07 MIL/uL — ABNORMAL LOW (ref 3.87–5.11)
RDW: 15.7 % — ABNORMAL HIGH (ref 11.5–15.5)
WBC: 13.8 10*3/uL — ABNORMAL HIGH (ref 4.0–10.5)
nRBC: 0 % (ref 0.0–0.2)

## 2020-01-07 LAB — BASIC METABOLIC PANEL
Anion gap: 12 (ref 5–15)
BUN: 18 mg/dL (ref 8–23)
CO2: 22 mmol/L (ref 22–32)
Calcium: 8.1 mg/dL — ABNORMAL LOW (ref 8.9–10.3)
Chloride: 107 mmol/L (ref 98–111)
Creatinine, Ser: 1.06 mg/dL — ABNORMAL HIGH (ref 0.44–1.00)
GFR calc Af Amer: 52 mL/min — ABNORMAL LOW (ref >60)
GFR calc non Af Amer: 45 mL/min — ABNORMAL LOW (ref >60)
Glucose, Bld: 89 mg/dL (ref 70–99)
Potassium: 4.1 mmol/L (ref 3.5–5.1)
Sodium: 141 mmol/L (ref 135–145)

## 2020-01-07 LAB — TSH: TSH: 0.187 u[IU]/mL — ABNORMAL LOW (ref 0.350–4.500)

## 2020-01-07 LAB — LACTIC ACID, PLASMA: Lactic Acid, Venous: 0.8 mmol/L (ref 0.5–1.9)

## 2020-01-07 MED ORDER — DILTIAZEM HCL 25 MG/5ML IV SOLN
10.0000 mg | INTRAVENOUS | Status: DC | PRN
  Filled 2020-01-07: qty 5

## 2020-01-07 MED ORDER — METOPROLOL TARTRATE 5 MG/5ML IV SOLN
2.5000 mg | Freq: Four times a day (QID) | INTRAVENOUS | Status: DC
  Administered 2020-01-07 – 2020-01-09 (×8): 2.5 mg via INTRAVENOUS
  Filled 2020-01-07 (×7): qty 5

## 2020-01-07 MED ORDER — ENOXAPARIN SODIUM 40 MG/0.4ML ~~LOC~~ SOLN
40.0000 mg | Freq: Every day | SUBCUTANEOUS | Status: DC
  Administered 2020-01-07: 40 mg via SUBCUTANEOUS
  Filled 2020-01-07: qty 0.4

## 2020-01-07 MED ORDER — OXYCODONE HCL 5 MG PO TABS
5.0000 mg | ORAL_TABLET | ORAL | Status: DC | PRN
  Administered 2020-01-07 – 2020-01-10 (×6): 5 mg via ORAL
  Filled 2020-01-07 (×6): qty 1

## 2020-01-07 MED ORDER — HEPARIN (PORCINE) 25000 UT/250ML-% IV SOLN
1200.0000 [IU]/h | INTRAVENOUS | Status: AC
  Administered 2020-01-07: 1050 [IU]/h via INTRAVENOUS
  Administered 2020-01-08 – 2020-01-09 (×2): 1150 [IU]/h via INTRAVENOUS
  Administered 2020-01-10 – 2020-01-11 (×2): 1200 [IU]/h via INTRAVENOUS
  Filled 2020-01-07 (×5): qty 250

## 2020-01-07 MED ORDER — METOPROLOL TARTRATE 5 MG/5ML IV SOLN
5.0000 mg | Freq: Once | INTRAVENOUS | Status: AC
  Administered 2020-01-07: 5 mg via INTRAVENOUS

## 2020-01-07 NOTE — Consult Note (Signed)
[place] Nurse ostomy consult note Pt had colostomy surgery performed yesterday.  Current pouch is leaking behind the barrier. Stoma type/location: Stoma is red and viable, flush with skin level, 1 1/2 inches Peristomal assessment: intact skin surrounding Treatment options for stomal/peristomal skin:  Applied barrier ring to attempt to maintain a seal Output:  No stool or flatus at this time Ostomy pouching: 2pc.  Education provided:  Pt does not want to look at the stoma at this time.  She asked appropriate questions and assisted with pouch change demonstration.  She has very limited mobility of her left hand related to recent fracture and will need a large amt assistance after discharge related to this; she states she plans to have a healthcare provider assist her after discharge. Supplies ordered to the bedside and educational materials left in the room. Reviewed pouching routines and ordering supplies.  Pt was able to open and close velcro to empty.  Applied barrier ring and 2 piece pouching system, but ordered precut one piece pouches for ease of use after discharge.  Enrolled patient in [place] program: Yes [place] team will continue to follow for further teaching sessions next week. Julien Girt MSN, RN, [place], [place], [place]

## 2020-01-07 NOTE — Progress Notes (Signed)
ANTICOAGULATION CONSULT NOTE - Initial Consult  Pharmacy Consult for Heparin Indication: atrial fibrillation  Allergies  Allergen Reactions  . Codeine Other (See Comments)    Nightmares, imagining things  . Keflex [Cephalexin] Nausea Only    Pt ended up in ER w/ CP    Patient Measurements: Height: 5\' 6"  (167.6 cm) Weight: 164 lb 14.5 oz (74.8 kg) IBW/kg (Calculated) : 59.3 Heparin Dosing Weight: 74.8kg  Vital Signs: Temp: 98.3 F (36.8 C) (02/26 1900) Temp Source: Oral (02/26 1900) BP: 113/71 (02/26 1900) Pulse Rate: 151 (02/26 1900)  Labs: Recent Labs    01/06/20 0454 01/07/20 0202  HGB 10.3* 9.3*  HCT 32.6* 29.3*  PLT 308 305  LABPROT 15.2  --   INR 1.2  --   CREATININE 1.44* 1.06*    Estimated Creatinine Clearance: 33.6 mL/min (A) (by C-G formula based on SCr of 1.06 mg/dL (H)).   Medical History: Past Medical History:  Diagnosis Date  . DVT (deep venous thrombosis) ([place]) 09/2015   RLE  . GERD (gastroesophageal reflux disease)   . Hypertension   . Hypothyroidism   . Melanoma ([place]) 06/16/2017   Facial melanoma, removed by Dr. Harvel Quale  . Peripheral vascular disease ([place])    peripheral neuropathy    Assesment: 86 yo female with new onset afib (CHADS2VASC score 4) who is s/p Ex lap and sigmoid colectomy to start on IV heparin with no boluses per Surgery service.   Goal of Therapy:  Heparin level 0.3-0.7 units/ml Monitor platelets by anticoagulation protocol: Yes   Plan:  D/C lovenox Heparin drip at 1050 units/hr, no boluses Heparin level in 8 hours Daily Heparin level, CBC Monitor for bleeding  Korie Brabson A. Levada Dy, PharmD, BCPS, FNKF Clinical Pharmacist [place] Please utilize Amion for appropriate phone number to reach the unit pharmacist ([place])   01/07/2020,8:31 PM

## 2020-01-07 NOTE — Progress Notes (Signed)
OT Cancellation Note  Patient Details Name: [name] MRN: [id number] DOB: [birth year]   Cancelled Treatment:    Reason Eval/Treat Not Completed: Fatigue/lethargy limiting ability to participate;Patient declined, no reason specified Pt not wanting to participate in therapy this AM due to pain in abdomen stating "I dont know if my stomach can take it." Will check back with PT this afternoon.   Corinne Ports E. Daiki Dicostanzo, COTA/L Acute Rehabilitation Services [place] 01/07/2020, 9:11 AM

## 2020-01-07 NOTE — Progress Notes (Signed)
Per Central Telemetry patient converted into A-fib at 0400 with HR bouncing between 90s-110s. Bodenheimer was notified. Patient was asymptomatic and orders were placed for IV Cardizem PRN with parameters.

## 2020-01-07 NOTE — Progress Notes (Signed)
Inpatient Rehabilitation Admissions Coordinator  Insurance had denied admit to CIR on 2/23 per Aleen Sells notes on 2/23. We can not admit without insurance approval. I will notify Dr. Tawanna Solo and RN CM , Vida Roller. We will again sign off.  Danne Baxter, RN, MSN Rehab Admissions Coordinator [phone] 01/07/2020 3:56 PM

## 2020-01-07 NOTE — Progress Notes (Signed)
Occupational Therapy Treatment Patient Details Name: [name] MRN: [id number] DOB: [birth year] Today's Date: 01/07/2020    History of present illness 86 yo female presenting to [facility] for severe left wrist pain after fall.  Pt reports that she had been experiencing upset stomach and diarrhea for a few days, had been lightheaded upon standing, had a fall without any significant appreciable injury yesterday, and then fell again this morning onto her left arm. Xrays show radial fx and left lateral 8th and 9th rib fxs (possibly the 7th as well). Pt underwent colostomy on 2/25.  PMH including is a HTN, hypothyroidism, polymyalgia rheumatica, and chronic kidney disease III.   OT comments  Patient continues to make steady progress towards goals in skilled OT session. Patient's session encompassed cotreat with PT due to new surgery on top of LUE break and rib fx. Pt completed functional ambulation and ADL tasks in sitting in session to date, with decreased participation due to pain. Pt willing to ambulate, and noted to require increased assist (Mod A 2) for equipment, safety, and overall balance upon ambulation (feeling woozy). Due to pt's excellent family support and previous level of function, pt would be an excellent candidate for CIR given current status. Will continue to follow acutely.    Follow Up Recommendations  CIR    Equipment Recommendations  3 in 1 bedside commode    Recommendations for Other Services Rehab consult    Precautions / Restrictions Precautions Precautions: Fall Precaution Comments: Sling for comfort. Benefits from wearing it during mobility Restrictions Weight Bearing Restrictions: Yes LUE Weight Bearing: Non weight bearing       Mobility Bed Mobility Overal bed mobility: Needs Assistance Bed Mobility: Rolling;Sidelying to Sit Rolling: Min assist Sidelying to sit: Min assist;Mod assist       General bed mobility comments: Pt needed cues for technique and  assist for upper body. Alot of pain intiially upon sitting and wanting to lie back down however encouarged to stay up and it got a little better per pt. Dizzy as well.    Transfers Overall transfer level: Needs assistance Equipment used: Left platform walker Transfers: Sit to/from Stand Sit to Stand: Min assist;Mod assist;+2 physical assistance;From elevated surface         General transfer comment: mod to power up on RUE.  Min to mod to steady initially due to dizziness and had not been up in days.     Balance Overall balance assessment: Needs assistance Sitting-balance support: Feet supported;Single extremity supported Sitting balance-Leahy Scale: Poor Sitting balance - Comments: Pt sitting sideways due to abdominal pain initially but was able to get her comfortable prior to standing.    Standing balance support: During functional activity;Bilateral upper extremity supported Standing balance-Leahy Scale: Fair Standing balance comment: requires assist with or without AD                           ADL either performed or assessed with clinical judgement   ADL Overall ADL's : Needs assistance/impaired     Grooming: Set up;Min guard;Sitting;Brushing hair Grooming Details (indicate cue type and reason): Denied oral care or wahsing face due to pain, however let therapist complete shower cap and pt combed hair             Lower Body Dressing: Moderate assistance;Sit to/from stand Lower Body Dressing Details (indicate cue type and reason): Adjusting socks at bed level Toilet Transfer: Moderate assistance;Ambulation Toilet Transfer Details (indicate cue  type and reason): Simulated with recliner, more so for equipment and "woozy feeling"         Functional mobility during ADLs: Minimal assistance;Moderate assistance General ADL Comments: Pt with decreased participation due to pain, requring max encouragement to participate     Vision       Perception      Praxis      Cognition Arousal/Alertness: Awake/alert Behavior During Therapy: WFL for tasks assessed/performed Overall Cognitive Status: Within Functional Limits for tasks assessed                                 General Comments: Required increased encouragement to participate        Exercises     Shoulder Instructions       General Comments      Pertinent Vitals/ Pain       Pain Assessment: Faces Faces Pain Scale: Hurts whole lot Pain Location: Complaining of abdominal pain Pain Descriptors / Indicators: Grimacing;Discomfort;Guarding;Aching Pain Intervention(s): Limited activity within patient's tolerance;Monitored during session;Patient requesting pain meds-RN notified;RN gave pain meds during session;Repositioned;Relaxation  Home Living                                          Prior Functioning/Environment              Frequency           Progress Toward Goals  OT Goals(current goals can now be found in the care plan section)  Progress towards OT goals: Progressing toward goals  Acute Rehab OT Goals Patient Stated Goal: Return to home and be independent again OT Goal Formulation: With patient Time For Goal Achievement: 01/17/20 Potential to Achieve Goals: Good  Plan Discharge plan needs to be updated    Co-evaluation    PT/OT/SLP Co-Evaluation/Treatment: Yes Reason for Co-Treatment: Complexity of the patient's impairments (multi-system involvement);To address functional/ADL transfers;For patient/therapist safety   OT goals addressed during session: ADL's and self-care;Strengthening/ROM      AM-PAC OT "6 Clicks" Daily Activity     Outcome Measure   Help from another person eating meals?: A Little Help from another person taking care of personal grooming?: A Little Help from another person toileting, which includes using toliet, bedpan, or urinal?: A Lot Help from another person bathing (including washing,  rinsing, drying)?: A Lot Help from another person to put on and taking off regular upper body clothing?: A Lot Help from another person to put on and taking off regular lower body clothing?: A Lot 6 Click Score: 14    End of Session Equipment Utilized During Treatment: Gait belt;Other (comment)(Platform walker L)  OT Visit Diagnosis: Unsteadiness on feet (R26.81);Other abnormalities of gait and mobility (R26.89);Muscle weakness (generalized) (M62.81);Pain Pain - Right/Left: Left Pain - part of body: Arm   Activity Tolerance Patient tolerated treatment well;Patient limited by pain   Patient Left in chair;with call bell/phone within reach;with family/visitor present;with chair alarm set   Nurse Communication Mobility status        Time: 1140-1205 OT Time Calculation (min): 25 min  Charges: OT General Charges $OT Visit: 1 Visit OT Treatments $Self Care/Home Management : 8-22 mins  Corinne Ports E. Valinda Fedie, COTA/L Acute Rehabilitation Services [phone] [place] 01/07/2020, 2:08 PM

## 2020-01-07 NOTE — Progress Notes (Addendum)
Physical Therapy Re-evaluation Patient Details Name: [name] MRN: [id number] DOB: September 24, 1923 Today's Date: 01/07/2020    History of Present Illness 86 yo female presenting to [facility] for severe left wrist pain after fall.  Pt reports that she had been experiencing upset stomach and diarrhea for a few days, had been lightheaded upon standing, had a fall without any significant appreciable injury yesterday, and then fell again this morning onto her left arm. Xrays show radial fx and left lateral 8th and 9th rib fxs (possibly the 7th as well). Pt underwent colostomy on 2/25.  PMH including is a HTN, hypothyroidism, polymyalgia rheumatica, and chronic kidney disease III.    PT Comments    Pt admitted with above diagnosis. Pt was able to sit EOB and walked to door with RW with mod assist and cues with chair follow. Pt much weaker than prior to the colostomy surgery as well as having more pain and now will need education regarding colostomy care. Will ask Rehab to come look at pt again.   Pt met 0/6 goals set originally.  Revised goals today. Pt currently with functional limitations due to balance and endurance deficits. Pt will benefit from skilled PT to increase their independence and safety with mobility to allow discharge to the venue listed below.    Follow Up Recommendations  CIR     Equipment Recommendations  Other (comment)(left platform for walker)    Recommendations for Other Services Rehab consult     Precautions / Restrictions Precautions Precautions: Fall Precaution Comments: Sling for comfort. Benefits from wearing it during mobility Restrictions Weight Bearing Restrictions: Yes LUE Weight Bearing: Non weight bearing    Mobility  Bed Mobility Overal bed mobility: Needs Assistance Bed Mobility: Rolling;Sidelying to Sit Rolling: Min assist Sidelying to sit: Min assist;Mod assist       General bed mobility comments: Pt needed cues for technique and assist for upper  body. Alot of pain intiially upon sitting and wanting to lie back down however encouarged to stay up and it got a little better per pt. Dizzy as well.    Transfers Overall transfer level: Needs assistance Equipment used: Left platform walker Transfers: Sit to/from Stand Sit to Stand: Min assist;Mod assist;+2 physical assistance;From elevated surface         General transfer comment: mod to power up on RUE.  Min to mod to steady initially due to dizziness and had not been up in days.   Ambulation/Gait Ambulation/Gait assistance: Mod assist;Min assist;+2 safety/equipment Gait Distance (Feet): 25 Feet Assistive device: Left platform walker Gait Pattern/deviations: Step-through pattern;Step-to pattern;Trunk flexed;Wide base of support;Shuffle;Drifts right/left Gait velocity: decreased Gait velocity interpretation: <1.31 ft/sec, indicative of household ambulator General Gait Details: Removed O2 but desat to 89% therefore replaced 4LO2 for walking.  Pt needed cues for technique as she was taking small steps and walking more to the left needing cues and steadying assist due to decr balance at times.  Needed assist to steer RW.  only able to make it to door and brought chair to pt.  Pt painful to sit to chair due to abdominal pain.  On 4LO2 throughout to keep sats >90%.  Other VSS with BP stable even though pt stated she is dizzy.    Stairs             Wheelchair Mobility    Modified Rankin (Stroke Patients Only)       Balance Overall balance assessment: Needs assistance Sitting-balance support: Feet supported;Single extremity supported Sitting  balance-Leahy Scale: Poor Sitting balance - Comments: Pt sitting sideways due to abdominal pain initially but was able to get her comfortable prior to standing.    Standing balance support: During functional activity;Bilateral upper extremity supported Standing balance-Leahy Scale: Fair Standing balance comment: requires assist with or  without AD                            Cognition Arousal/Alertness: Awake/alert Behavior During Therapy: WFL for tasks assessed/performed Overall Cognitive Status: Within Functional Limits for tasks assessed                                 General Comments: Required increased encouragement to participate      Exercises      General Comments        Pertinent Vitals/Pain Pain Assessment: Faces Faces Pain Scale: Hurts whole lot Pain Location: Complaining of abdominal pain Pain Descriptors / Indicators: Grimacing;Discomfort;Guarding;Aching Pain Intervention(s): Limited activity within patient's tolerance;Monitored during session;Patient requesting pain meds-RN notified;RN gave pain meds during session;Repositioned;Relaxation    Home Living                      Prior Function            PT Goals (current goals can now be found in the care plan section) Acute Rehab PT Goals Patient Stated Goal: Return to home and be independent again PT Goal Formulation: With patient Time For Goal Achievement: 01/21/20 Potential to Achieve Goals: Good Progress towards PT goals: Progressing toward goals    Frequency    Min 5X/week      PT Plan Current plan remains appropriate    Co-evaluation PT/OT/SLP Co-Evaluation/Treatment: Yes Reason for Co-Treatment: Complexity of the patient's impairments (multi-system involvement);For patient/therapist safety PT goals addressed during session: Mobility/safety with mobility OT goals addressed during session: ADL's and self-care;Strengthening/ROM      AM-PAC PT "6 Clicks" Mobility   Outcome Measure  Help needed turning from your back to your side while in a flat bed without using bedrails?: A Lot Help needed moving from lying on your back to sitting on the side of a flat bed without using bedrails?: A Lot Help needed moving to and from a bed to a chair (including a wheelchair)?: A Lot Help needed standing  up from a chair using your arms (e.g., wheelchair or bedside chair)?: A Lot Help needed to walk in hospital room?: A Lot Help needed climbing 3-5 steps with a railing? : A Lot 6 Click Score: 12    End of Session Equipment Utilized During Treatment: Gait belt;Other (comment)(sling LUE) Activity Tolerance: Patient limited by fatigue;Patient limited by pain Patient left: in chair;with call bell/phone within reach;with chair alarm set;with family/visitor present Nurse Communication: Mobility status PT Visit Diagnosis: Unsteadiness on feet (R26.81);Other abnormalities of gait and mobility (R26.89);Muscle weakness (generalized) (M62.81);Difficulty in walking, not elsewhere classified (R26.2);Repeated falls (R29.6);Pain Pain - Right/Left: Left Pain - part of body: Arm(Ribs)     Time: 8676-1950 PT Time Calculation (min) (ACUTE ONLY): 25 min  Charges:                Re-evaluation 1        Birdie Fetty W,PT Acute Rehabilitation Services Pager:  [phone]  Office:  [place] 01/07/2020, 2:27 PM

## 2020-01-07 NOTE — Progress Notes (Addendum)
1 Day Post-Op  Subjective: CC: Post-Op Patient noted to be in A. fib.Marland Kitchen  She went in overnight.  No history of this prior.  Currently rate controlled with heart rate while was in the room right at 100.  She has Cardizem ordered as needed per the hospitalist and has not required this.   She reports that she has generalized soreness of her abdomen.  She denies any chest pain, shortness of breath, nausea or vomiting.  She has not gotten out of bed.  Her Foley is still in place.  She is unsure of any flatus or stool from colostomy as she does not want to look at it yet.  Objective: Vital signs in last 24 hours: Temp:  [96.9 F (36.1 C)-99.1 F (37.3 C)] 96.9 F (36.1 C) (02/26 0725) Pulse Rate:  [69-107] 107 (02/26 0725) Resp:  [16-28] 21 (02/26 0725) BP: (98-124)/(35-72) 118/64 (02/26 0725) SpO2:  [94 %-99 %] 96 % (02/26 0725) Last BM Date: 01/05/20  Intake/Output from previous day: 02/25 0701 - 02/26 0700 In: 3529.9 [I.V.:3178.2; IV Piggyback:351.7] Out: 750 [Urine:700; Blood:50] Intake/Output this shift: No intake/output data recorded.  PE: Gen:  Alert, NAD, pleasant Card:  Irr, Irr with HR in ~100 on monitor  Pulm: Mild tachypnea, on oxygen 2 L.  Lungs clear bilaterally without wheezes, rhonchi or rails noted.  Pulls 750 on incentive spirometer Abd: Soft, ND, appropriately tender postop without peritonitis, hypoactive bowel sounds.  Colostomy bag with some air in sweat in bag.  Stoma red and viable slightly above skin level.  Midline wound with healthy appearing granulation tissue at the base.  No evidence of dehiscence or evisceration.  No signs of infection.   Ext:  No LE edema.  Brace on left arm Psych: A&Ox3  Skin: no rashes noted, warm and dry   Lab Results:  Recent Labs    01/06/20 0454 01/07/20 0202  WBC 14.2* 13.8*  HGB 10.3* 9.3*  HCT 32.6* 29.3*  PLT 308 305   BMET Recent Labs    01/06/20 0454 01/07/20 0202  NA 139 141  K 4.3 4.1  CL 102 107  CO2  27 22  GLUCOSE 104* 89  BUN 21 18  CREATININE 1.44* 1.06*  CALCIUM 8.4* 8.1*   PT/INR Recent Labs    01/06/20 0454  LABPROT 15.2  INR 1.2   CMP     Component Value Date/Time   NA 141 01/07/2020 0202   K 4.1 01/07/2020 0202   CL 107 01/07/2020 0202   CO2 22 01/07/2020 0202   GLUCOSE 89 01/07/2020 0202   BUN 18 01/07/2020 0202   CREATININE 1.06 (H) 01/07/2020 0202   CREATININE 0.95 (H) 01/26/2016 1552   CALCIUM 8.1 (L) 01/07/2020 0202   PROT 4.9 (L) 01/06/2020 0454   ALBUMIN 2.1 (L) 01/06/2020 0454   AST 14 (L) 01/06/2020 0454   ALT 13 01/06/2020 0454   ALKPHOS 64 01/06/2020 0454   BILITOT 1.6 (H) 01/06/2020 0454   GFRNONAA 45 (L) 01/07/2020 0202   GFRAA 52 (L) 01/07/2020 0202   Lipase     Component Value Date/Time   LIPASE 25.0 03/06/2016 1123       Studies/Results: CT ABDOMEN PELVIS W CONTRAST  Result Date: 01/05/2020 CLINICAL DATA:  Abdominal pain, recent fall with known fractures EXAM: CT ABDOMEN AND PELVIS WITH CONTRAST TECHNIQUE: Multidetector CT imaging of the abdomen and pelvis was performed using the standard protocol following bolus administration of intravenous contrast. CONTRAST:  180mL OMNIPAQUE IOHEXOL  300 MG/ML  SOLN COMPARISON:  03/04/2016 FINDINGS: Lower chest: Limited imaging through the lung bases demonstrates minimal hypoventilatory changes. No effusion or pneumothorax. Hepatobiliary: No focal liver abnormality is seen. No gallstones, gallbladder wall thickening, or biliary dilatation. Pancreas: There is diffuse fatty atrophy of the pancreas. No acute abnormalities. Spleen: No splenic injury or perisplenic hematoma. Adrenals/Urinary Tract: Stable right renal cortical cyst. Kidneys enhance normally and symmetrically. No urinary tract calculi or obstructive uropathy. The bladder is unremarkable. Adrenal glands are stable. Stomach/Bowel: Inflammatory changes surround the sigmoid colon consistent with acute diverticulitis. There is extensive  pneumoperitoneum consistent with ruptured diverticulum. I do not see any fluid collection or abscess. No bowel obstruction or ileus. Vascular/Lymphatic: Stable atherosclerosis of the abdominal aorta and its branches. No critical stenosis. No pathologic adenopathy within the abdomen or pelvis. Reproductive: Status post hysterectomy. No adnexal masses. Other: There is diffuse pneumoperitoneum consistent with perforated diverticulitis. Trace free fluid within the pelvis. No fluid collection or abscess. Musculoskeletal: Displaced posterolateral left seventh through ninth rib fractures are noted. Wedge compression deformity of the T11 vertebral body appears chronic, as there is no visible fracture line or paraspinal hematoma. Reconstructed images demonstrate no additional findings. IMPRESSION: 1. Perforated sigmoid diverticulitis with extensive pneumoperitoneum. No fluid collection or abscess. 2. Displaced posterolateral left seventh through ninth rib fractures. 3. Wedge compression deformity of the T11 vertebral body appears chronic, as there is no visible fracture line or paraspinal hematoma. 4. Aortic Atherosclerosis (ICD10-I70.0). These results were called by telephone at the time of interpretation on 01/05/2020 at 7:32 pm to provider Lang Snow, who verbally acknowledged these results. Electronically Signed   By: Randa Ngo M.D.   On: 01/05/2020 19:32    Anti-infectives: Anti-infectives (From admission, onward)   Start     Dose/Rate Route Frequency Ordered Stop   01/06/20 0230  piperacillin-tazobactam (ZOSYN) IVPB 3.375 g     3.375 g 12.5 mL/hr over 240 Minutes Intravenous Every 8 hours 01/05/20 1952     01/05/20 2000  piperacillin-tazobactam (ZOSYN) IVPB 3.375 g     3.375 g 100 mL/hr over 30 Minutes Intravenous  Once 01/05/20 1952 01/05/20 2049   01/01/20 2030  ceFAZolin (ANCEF) IVPB 1 g/50 mL premix  Status:  Discontinued     1 g 100 mL/hr over 30 Minutes Intravenous Every 8 hours 01/01/20 1307  01/03/20 1357   01/01/20 1315  ceFAZolin (ANCEF) IVPB 1 g/50 mL premix     1 g 100 mL/hr over 30 Minutes Intravenous NOW 01/01/20 1307 01/01/20 1454   01/01/20 0600  ceFAZolin (ANCEF) IVPB 2g/100 mL premix  Status:  Discontinued     2 g 200 mL/hr over 30 Minutes Intravenous To Short Stay 12/31/19 1928 01/01/20 1259   01/01/20 0600  vancomycin (VANCOCIN) IVPB 1000 mg/200 mL premix     1,000 mg 200 mL/hr over 60 Minutes Intravenous To Short Stay 12/31/19 1928 01/01/20 1055       Assessment/Plan HTN Hypothyroidism Hx DVT in 2016 GERD PVD Recent fall w/ left wrist fx s/p L wrist repair by Dr. Caralyn Guile on 2/20 Polymyalgia rheumatica on PO Prednisone - hold prednisone if able  CKD w/ AKI - improved. Cr 1.04 New onset A. Fib overnight - 12 lead EKG. Cardiology consult (spoke with Dr. Doylene Canard) ABL anemia - hgb 13.8 from 14.2. Expected drop post op  Perforated sigmoid diverticulitis with purulent peritonitis - S/p ex lap, Sigmoid colectomy with end colostomy -- Hartmann procedure - Dr. Redmond Pulling - 01/06/2020 - POD #1 - D/c  Foley - Cont IV abx  - Allow CLD and oral meds - Mobilize. Reordered PT/OT - Pulm toilet, IS - WOCN consulted - BID WTD for midline dressing  FEN - CLD, IVF VTE - SCDs, convert to daily subq Lovenox  ID - Zosyn 2/24 >> Foley - d/c Follow-Up - Dr. Redmond Pulling   LOS: 9 days    Jillyn Ledger , [facility] 01/07/2020, 7:51 AM Please see Amion for pager number during day hours 7:00am-4:30pm

## 2020-01-07 NOTE — Consult Note (Signed)
Referring Physician: Irean Hong  LATRICIA VALLANCE is an 86 y.o. female.                       Chief Complaint: Atrial fibrillation  HPI: 85 years old white female with PMH of HTN, hypothyroidism, polymyalgia rheumatica and CKD, IIIa, had sustained left wrist injury after a fall about 10 days ago. She also recently had abdominal pain with perforated diverticulitis and underwent Hartmann's procedure yesterday.  She had atrial fibrillation with RVR this AM responding to diltiazem IV prn. Her recent echocardiogram showed mild LV systolic dysfunction with EF 50 % and without significant valvular regurgitation. Patient denies chest pain or palpitation. She has ongoing abdominal pain. Her Hgb is slightly down from baseline post procedure. Her TSH is pending.  Past Medical History:  Diagnosis Date  . DVT (deep venous thrombosis) ([facility] [place]) 09/2015   RLE  . GERD (gastroesophageal reflux disease)   . Hypertension   . Hypothyroidism   . Melanoma ([place]) 06/16/2017   Facial melanoma, removed by Dr. Harvel Quale  . Peripheral vascular disease ([place])    peripheral neuropathy      Past Surgical History:  Procedure Laterality Date  . ABDOMINAL HYSTERECTOMY    . APPENDECTOMY    . BACK SURGERY    . BREAST SURGERY     left breast biopsy  . COLOSTOMY N/A 01/06/2020   Procedure: Colostomy;  Surgeon: Greer Pickerel, MD;  Location: [facility];  Service: General;  Laterality: N/A;  . EYE SURGERY     cataract  . HARDWARE REMOVAL Left 01/01/2020   Procedure: HARDWARE REMOVAL;  Surgeon: Iran Planas, MD;  Location: [facility];  Service: Orthopedics;  Laterality: Left;  . LAPAROTOMY N/A 01/06/2020   Procedure: Exploratory Laparotomy, Open Sigmoid colectomy;  Surgeon: Greer Pickerel, MD;  Location: [facility];  Service: General;  Laterality: N/A;  . LIPOMA EXCISION Left 04/25/2015   Procedure: EXCISION OF LIPOMA LEFT ARM;  Surgeon: Donnie Mesa, MD;  Location: [facility];  Service: General;  Laterality: Left;  .  LUMBAR LAMINECTOMY/DECOMPRESSION MICRODISCECTOMY  11/20/2011   Procedure: LUMBAR LAMINECTOMY/DECOMPRESSION MICRODISCECTOMY;  Surgeon: Jeneen Rinks P [place];  Location: [facility];  Service: Orthopedics;  Laterality: N/A;  Decompressive Laminectomy L2 to the Sacrum (X-Ray)  . OPEN REDUCTION INTERNAL FIXATION (ORIF) DISTAL RADIAL FRACTURE Left 01/01/2020   Procedure: OPEN REDUCTION INTERNAL FIXATION (ORIF) DISTAL RADIAL FRACTURE;  Surgeon: Iran Planas, MD;  Location: [facility];  Service: Orthopedics;  Laterality: Left;  . OTHER SURGICAL HISTORY     left wrist surgery - has plate in left wrist   . OTHER SURGICAL HISTORY     right knee surgery due to torn cartilage   . TONSILLECTOMY    . WRIST FRACTURE SURGERY Left     Family History  Problem Relation Age of Onset  . Cancer Mother        BREAST  . COPD Father   . Emphysema Father   . Cancer Daughter        breast   Social History:  reports that she has never smoked. She has never used smokeless tobacco. She reports that she does not drink alcohol or use drugs.  Allergies:  Allergies  Allergen Reactions  . Codeine Other (See Comments)    Nightmares, imagining things  . Keflex [Cephalexin] Nausea Only    Pt ended up in ER w/ CP    Medications Prior to Admission  Medication Sig Dispense Refill  . amLODipine (NORVASC) 10  MG tablet TAKE 1 TABLET BY MOUTH EVERY DAY (Patient taking differently: Take 10 mg by mouth daily. ) 90 tablet 1  . gabapentin (NEURONTIN) 300 MG capsule TAKE 2 CAPSULES BY MOUTH AT BEDTIME (Patient taking differently: Take 600 mg by mouth at bedtime. ) 180 capsule 1  . Ibuprofen-Acetaminophen (ADVIL DUAL ACTION) 125-250 MG TABS Take 2 tablets by mouth as needed (pain).    Marland Kitchen levothyroxine (SYNTHROID) 50 MCG tablet TAKE 1 TABLET BY MOUTH EVERY DAY (Patient taking differently: Take 50 mcg by mouth daily. ) 90 tablet 1  . Multiple Vitamins-Minerals (PRESERVISION AREDS 2) CAPS Take 1 capsule by mouth daily.     . nadolol (CORGARD) 80  MG tablet TAKE 1 TABLET BY MOUTH EVERY DAY (Patient taking differently: Take 80 mg by mouth daily. ) 90 tablet 1  . pantoprazole (PROTONIX) 40 MG tablet TAKE 1 TABLET BY MOUTH EVERY DAY (Patient taking differently: Take 40 mg by mouth daily. ) 90 tablet 1  . predniSONE (DELTASONE) 5 MG tablet Take 10 mg by mouth daily with breakfast.     . celecoxib (CELEBREX) 100 MG capsule TAKE 1 CAPSULE BY MOUTH TWICE A DAY (Patient not taking: Reported on 12/29/2019) 60 capsule 3    Results for orders placed or performed during the hospital encounter of 12/29/19 (from the past 48 hour(s))  CBC with Differential/Platelet     Status: Abnormal   Collection Time: 01/06/20  4:54 AM  Result Value Ref Range   WBC 14.2 (H) 4.0 - 10.5 K/uL   RBC 3.44 (L) 3.87 - 5.11 MIL/uL   Hemoglobin 10.3 (L) 12.0 - 15.0 g/dL   HCT 32.6 (L) 36.0 - 46.0 %   MCV 94.8 80.0 - 100.0 fL   MCH 29.9 26.0 - 34.0 pg   MCHC 31.6 30.0 - 36.0 g/dL   RDW 15.4 11.5 - 15.5 %   Platelets 308 150 - 400 K/uL   nRBC 0.0 0.0 - 0.2 %   Neutrophils Relative % 86 %   Neutro Abs 12.3 (H) 1.7 - 7.7 K/uL   Lymphocytes Relative 10 %   Lymphs Abs 1.4 0.7 - 4.0 K/uL   Monocytes Relative 3 %   Monocytes Absolute 0.4 0.1 - 1.0 K/uL   Eosinophils Relative 0 %   Eosinophils Absolute 0.0 0.0 - 0.5 K/uL   Basophils Relative 0 %   Basophils Absolute 0.0 0.0 - 0.1 K/uL   Immature Granulocytes 1 %   Abs Immature Granulocytes 0.07 0.00 - 0.07 K/uL    Comment: Performed at [facility], [address] [place], [place] [address]  Comprehensive metabolic panel     Status: Abnormal   Collection Time: 01/06/20  4:54 AM  Result Value Ref Range   Sodium 139 135 - 145 mmol/L   Potassium 4.3 3.5 - 5.1 mmol/L   Chloride 102 98 - 111 mmol/L   CO2 27 22 - 32 mmol/L   Glucose, Bld 104 (H) 70 - 99 mg/dL    Comment: Glucose reference range applies only to samples taken after fasting for at least 8 hours.   BUN 21 8 - 23 mg/dL   Creatinine, Ser 1.44 (H) 0.44  - 1.00 mg/dL   Calcium 8.4 (L) 8.9 - 10.3 mg/dL   Total Protein 4.9 (L) 6.5 - 8.1 g/dL   Albumin 2.1 (L) 3.5 - 5.0 g/dL   AST 14 (L) 15 - 41 U/L   ALT 13 0 - 44 U/L   Alkaline Phosphatase 64  38 - 126 U/L   Total Bilirubin 1.6 (H) 0.3 - 1.2 mg/dL   GFR calc non Af Amer 31 (L) >60 mL/min   GFR calc Af Amer 36 (L) >60 mL/min   Anion gap 10 5 - 15    Comment: Performed at [facility] [address] [place], [place] [address]  Protime-INR     Status: None   Collection Time: 01/06/20  4:54 AM  Result Value Ref Range   Prothrombin Time 15.2 11.4 - 15.2 seconds   INR 1.2 0.8 - 1.2    Comment: (NOTE) INR goal varies based on device and disease states. Performed at [facility], [place] [address], [place], [place] [address]   Type and screen [facility]     Status: None   Collection Time: 01/06/20  8:14 AM  Result Value Ref Range   ABO/RH(D) O POS    Antibody Screen NEG    Sample Expiration      01/09/2020,2359 Performed at [facility], [place] [address], [place], [place] [address]   ABO/Rh     Status: None   Collection Time: 01/06/20  8:14 AM  Result Value Ref Range   ABO/RH(D)      O POS Performed at [facility] [address] [place], [place] [address]   Glucose, capillary     Status: None   Collection Time: 01/06/20  3:35 PM  Result Value Ref Range   Glucose-Capillary 87 70 - 99 mg/dL    Comment: Glucose reference range applies only to samples taken after fasting for at least 8 hours.  Glucose, capillary     Status: None   Collection Time: 01/06/20  7:11 PM  Result Value Ref Range   Glucose-Capillary 88 70 - 99 mg/dL    Comment: Glucose reference range applies only to samples taken after fasting for at least 8 hours.  CBC with Differential/Platelet     Status: Abnormal   Collection Time: 01/07/20  2:02 AM  Result Value Ref Range   WBC 13.8 (H) 4.0 - 10.5 K/uL   RBC 3.07 (L) 3.87 - 5.11 MIL/uL   Hemoglobin 9.3 (L) 12.0 - 15.0  g/dL   HCT 29.3 (L) 36.0 - 46.0 %   MCV 95.4 80.0 - 100.0 fL   MCH 30.3 26.0 - 34.0 pg   MCHC 31.7 30.0 - 36.0 g/dL   RDW 15.7 (H) 11.5 - 15.5 %   Platelets 305 150 - 400 K/uL   nRBC 0.0 0.0 - 0.2 %   Neutrophils Relative % 89 %   Neutro Abs 12.3 (H) 1.7 - 7.7 K/uL   Lymphocytes Relative 6 %   Lymphs Abs 0.8 0.7 - 4.0 K/uL   Monocytes Relative 4 %   Monocytes Absolute 0.5 0.1 - 1.0 K/uL   Eosinophils Relative 0 %   Eosinophils Absolute 0.0 0.0 - 0.5 K/uL   Basophils Relative 0 %   Basophils Absolute 0.0 0.0 - 0.1 K/uL   Immature Granulocytes 1 %   Abs Immature Granulocytes 0.12 (H) 0.00 - 0.07 K/uL    Comment: Performed at [facility], [address] [place], [place] [id number]  Basic metabolic panel     Status: Abnormal   Collection Time: 01/07/20  2:02 AM  Result Value Ref Range   Sodium 141 135 - 145 mmol/L   Potassium 4.1 3.5 - 5.1 mmol/L   Chloride 107 98 - 111 mmol/L  CO2 22 22 - 32 mmol/L   Glucose, Bld 89 70 - 99 mg/dL    Comment: Glucose reference range applies only to samples taken after fasting for at least 8 hours.   BUN 18 8 - 23 mg/dL   Creatinine, Ser 1.06 (H) 0.44 - 1.00 mg/dL   Calcium 8.1 (L) 8.9 - 10.3 mg/dL   GFR calc non Af Amer 45 (L) >60 mL/min   GFR calc Af Amer 52 (L) >60 mL/min   Anion gap 12 5 - 15    Comment: Performed at [facility] [address] [place], [place] [address]  Lactic acid, plasma     Status: None   Collection Time: 01/07/20  2:02 AM  Result Value Ref Range   Lactic Acid, Venous 0.8 0.5 - 1.9 mmol/L    Comment: Performed at [facility] [address], [place], [place] [address]   CT ABDOMEN PELVIS W CONTRAST  Result Date: 01/05/2020 CLINICAL DATA:  Abdominal pain, recent fall with known fractures EXAM: CT ABDOMEN AND PELVIS WITH CONTRAST TECHNIQUE: Multidetector CT imaging of the abdomen and pelvis was performed using the standard protocol following bolus administration of intravenous contrast. CONTRAST:   158mL OMNIPAQUE IOHEXOL 300 MG/ML  SOLN COMPARISON:  03/04/2016 FINDINGS: Lower chest: Limited imaging through the lung bases demonstrates minimal hypoventilatory changes. No effusion or pneumothorax. Hepatobiliary: No focal liver abnormality is seen. No gallstones, gallbladder wall thickening, or biliary dilatation. Pancreas: There is diffuse fatty atrophy of the pancreas. No acute abnormalities. Spleen: No splenic injury or perisplenic hematoma. Adrenals/Urinary Tract: Stable right renal cortical cyst. Kidneys enhance normally and symmetrically. No urinary tract calculi or obstructive uropathy. The bladder is unremarkable. Adrenal glands are stable. Stomach/Bowel: Inflammatory changes surround the sigmoid colon consistent with acute diverticulitis. There is extensive pneumoperitoneum consistent with ruptured diverticulum. I do not see any fluid collection or abscess. No bowel obstruction or ileus. Vascular/Lymphatic: Stable atherosclerosis of the abdominal aorta and its branches. No critical stenosis. No pathologic adenopathy within the abdomen or pelvis. Reproductive: Status post hysterectomy. No adnexal masses. Other: There is diffuse pneumoperitoneum consistent with perforated diverticulitis. Trace free fluid within the pelvis. No fluid collection or abscess. Musculoskeletal: Displaced posterolateral left seventh through ninth rib fractures are noted. Wedge compression deformity of the T11 vertebral body appears chronic, as there is no visible fracture line or paraspinal hematoma. Reconstructed images demonstrate no additional findings. IMPRESSION: 1. Perforated sigmoid diverticulitis with extensive pneumoperitoneum. No fluid collection or abscess. 2. Displaced posterolateral left seventh through ninth rib fractures. 3. Wedge compression deformity of the T11 vertebral body appears chronic, as there is no visible fracture line or paraspinal hematoma. 4. Aortic Atherosclerosis (ICD10-I70.0). These results were  called by telephone at the time of interpretation on 01/05/2020 at 7:32 pm to provider Lang Snow, who verbally acknowledged these results. Electronically Signed   By: Randa Ngo M.D.   On: 01/05/2020 19:32    Review Of Systems Constitutional: No fever, chills, weight loss or gain. Eyes: No vision change, wears glasses. No discharge or pain. Ears: No hearing loss, No tinnitus. Respiratory: No asthma, COPD, pneumonias. No shortness of breath. No hemoptysis. Cardiovascular: No chest pain, palpitation, leg edema. Gastrointestinal: Positive nausea, vomiting, diarrhea, constipation. No GI bleed. No hepatitis. Genitourinary: No dysuria, hematuria, kidney stone. No incontinance. H/O CKD Neurological: No headache, stroke, seizures.  Psychiatry: No psych facility admission for anxiety, depression, suicide. No detox. Skin: No rash. Musculoskeletal: Positive joint pain, fibromyalgia. No neck pain, back  pain. Lymphadenopathy: No lymphadenopathy. Hematology: H/O anemia or easy bruising.   Blood pressure 118/64, pulse (!) 107, temperature (!) 96.9 F (36.1 C), temperature source Axillary, resp. rate (!) 21, height 5\' 6"  (1.676 m), weight 74.8 kg, SpO2 96 %. Body mass index is 26.62 kg/m. General appearance: alert, cooperative, appears stated age and no distress Head: Normocephalic, atraumatic. Eyes: Blue eyes, pale pink conjunctiva, corneas clear.  Neck: No adenopathy, no carotid bruit, no JVD, supple. Thyroid not enlarged. Resp: Basal crackles to auscultation bilaterally. Cardio: Irregular rate and rhythm, S1, S2 normal, II/VI systolic murmur, no click, rub or gallop GI: Soft, generalized tenderness; bowel sounds diminished; no organomegaly. Extremities: No edema, cyanosis or clubbing. Left forearm and hand in cast Skin: Warm and dry.  Neurologic: Alert and oriented X 3, normal strength.   Assessment/Plan Atrial fibrillation with RVR, [id number] score of 4 Acute perforated diverticulitis  with peritonitis S/P Hartmann's procedure CKD IIIa HTN Hypothyroidism Polymyalgia Rheumatica  Check TSH, pending. Needs IV heparin followed by PO anticoagulation when OK with surgery. Dr. Terrence Dupont on call for weekend.  Time spent: Review of old records, Lab, x-rays, EKG, other cardiac tests, examination, discussion with patient, nurse and consulting physician over 70 minutes.  Birdie Riddle, MD  01/07/2020, 9:47 AM

## 2020-01-07 NOTE — Progress Notes (Signed)
PROGRESS NOTE    [name]  [id number] DOB: 1922/12/12 DOA: 12/29/2019 PCP: [name], MD   Brief Narrative:  Patient is a 86 year old female with history of hypertension, hypothyroidism, polymyalgia rheumatica, CKD 3A who presented initially to [place] for the evaluation of severe left wrist pain after a fall.  She was also having abdominal pain, diarrhea for last few days, lightheaded upon standing and fell multiple times.  When she presented, she was found to have AKI, distal left radius fracture.  She also had left lateral rib fractures.  Orthopedics was consulted and she underwent ORIF of left radial fracture.  Physical therapy recommended CIR but insurance denied.  We were planning for discharging to home with home health.  On 01/04/2019 1 AM, she developed severe lower abdominal pain.  CT abdomen/pelvis showed perforated sigmoid diverticulitis with pneumoperitoneum.  General surgery consulted.  After discussion with family, general surgery initially decided for conservative management. She had worsening leukocytosis, became hypotensive and developed AKI.  She underwent exploratory laparotomy, sigmoid colectomy with end colostomy by Dr. Redmond Pulling on 01/06/2020. Last night she developed A. fib with RVR.  Cardiology been consulted.  Assessment & Plan:   Principal Problem:   Acute renal failure superimposed on stage 3a chronic kidney disease (HCC) Active Problems:   Hypothyroidism   HTN (hypertension)   Preoperative cardiovascular examination   Closed fracture of multiple ribs   Closed fracture of left distal radius   Recurrent falls   Perforated sigmoid diverticulitis/pneumoperitoneum: On 01/04/2019 1 AM, she developed severe lower abdominal pain.  CT abdomen/pelvis showed perforated sigmoid diverticulitis with pneumoperitoneum.  General surgery consulted.  After discussion with family, general surgery initially decided for conservative management. She had  worsening leukocytosis, became hypotensive and developed AKI.  She underwent exploratory laparotomy, sigmoid colectomy with end colostomy by Dr. Redmond Pulling on 01/06/2020 Continue Zosyn, IV fluids  Afib with RVR: Transient A. fib most likely secondary to present medical/surgical illness.  Cardiology being consulted.  AKI on CKD stage IIIa: Continue IV fluids, continue to monitor BMP  Acute hypoxic respiratory failure: Most likely secondary to pain/atelectasis from rib fractures.  Continue incentive spirometry.  Chest x-rays did not show any acute infection but showed atelectasis of left lung base.  Currently on room air  Distal left radius fracture: Secondary to fall.  X-ray showed oblique fracture of the distal radius at the proximal end of the plate and screws in place for fixation of remote healed fracture.  Currently in sling.  Status post ORIF by orthopedics.  Removal of left distal radius hardware deep implants.  Orthopedics recommended not weightbearing of left upper extremity .  Follow-up recommended in 2 weeks.  Continue pain management. PT/OT recommended had CIR but insurance denied.   PT recommending CIR again.  Reconsulted case management, rehab.  Lateral left rib fractures: Fracture of lateral eighth, ninth ribs on chest x-ray.  Continue pain management, incentive spirometry  Polymyalgia rheumatica: Continue home dose of prednisone  Hypertension: Blood pressure soft.  BP medications held  Hypothyroidism: Continue Synthyroid  Dizziness/fall: Suspected secondary to dehydration.  Echocardiogram showed ejection fraction [id number], grade 1 diastolic dysfunction.  Orthostatic vitals were negative echo also showed regional wall motion abnormalities, apical septal and anterior hypokinesis.  Cardiology was consulted, no further work-up needed.  Normocytic anemia: Hemoglobin stable.  Continue to monitor.           DVT prophylaxis:Heparin [place] Code Status: Full Family Communication: Discussed  with son at bedside on  01/06/2020  disposition Plan: Patient is from home.  Underwent surgery on 01/06/2020, not stable for discharge.  Needs surgical clearance before discharge.  Also needs PT/OT evaluation  Consultants: Orthopedics, cardiology  Procedures: ORIF, exploratory laparotomy  Antimicrobials:  Anti-infectives (From admission, onward)   Start     Dose/Rate Route Frequency Ordered Stop   01/06/20 0230  piperacillin-tazobactam (ZOSYN) IVPB 3.375 g     3.375 g 12.5 mL/hr over 240 Minutes Intravenous Every 8 hours 01/05/20 1952     01/05/20 2000  piperacillin-tazobactam (ZOSYN) IVPB 3.375 g     3.375 g 100 mL/hr over 30 Minutes Intravenous  Once 01/05/20 1952 01/05/20 2049   01/01/20 2030  ceFAZolin (ANCEF) IVPB 1 g/50 mL premix  Status:  Discontinued     1 g 100 mL/hr over 30 Minutes Intravenous Every 8 hours 01/01/20 1307 01/03/20 1357   01/01/20 1315  ceFAZolin (ANCEF) IVPB 1 g/50 mL premix     1 g 100 mL/hr over 30 Minutes Intravenous NOW 01/01/20 1307 01/01/20 1454   01/01/20 0600  ceFAZolin (ANCEF) IVPB 2g/100 mL premix  Status:  Discontinued     2 g 200 mL/hr over 30 Minutes Intravenous To Short Stay 12/31/19 1928 01/01/20 1259   01/01/20 0600  vancomycin (VANCOCIN) IVPB 1000 mg/200 mL premix     1,000 mg 200 mL/hr over 60 Minutes Intravenous To Short Stay 12/31/19 1928 01/01/20 1055      Subjective:  Patient seen and examined the bedside this morning.  Hemodynamically stable.  Sleepy this morning.  She says pain is tolerable.  She states she has been passing gas.  No bowel movement.  Objective: Vitals:   01/07/20 0015 01/07/20 0405 01/07/20 0710 01/07/20 0725  BP: 110/60 112/67 113/66 118/64  Pulse: 80 (!) 107 (!) 102 (!) 107  Resp:  20 19 (!) 21  Temp: 98.2 F (36.8 C) 98.5 F (36.9 C)  (!) 96.9 F (36.1 C)  TempSrc: Oral Oral  Axillary  SpO2: 99%  97% 96%  Weight:      Height:        Intake/Output Summary (Last 24 hours) at 01/07/2020 0757 Last data  filed at 01/07/2020 0710 Gross per 24 hour  Intake 3529.88 ml  Output 750 ml  Net 2779.88 ml   Filed Weights   12/31/19 2115 01/04/20 2047 01/05/20 2057  Weight: 71.7 kg 70.8 kg 74.8 kg    Examination:  General exam: Sleepy, drowsy, generalized weakness Respiratory system:  no wheezes or crackles  Cardiovascular system:afib, No JVD, murmurs, rubs, gallops or clicks. Gastrointestinal system: Abdomen is distended, soft and is appropriately tender.  Empty colostomy bag.  Surgical wound covered with dressing  Central nervous system: Alert and oriented. No focal neurological deficits. Extremities: No edema, no clubbing ,no cyanosis Skin: No rashes, lesions or ulcers,no icterus ,no pallor    Data Reviewed: I have personally reviewed following labs and imaging studies  CBC: Recent Labs  Lab 01/02/20 0410 01/03/20 0412 01/04/20 0509 01/06/20 0454 01/07/20 0202  WBC  --   --   --  14.2* 13.8*  NEUTROABS  --   --   --  12.3* 12.3*  HGB 10.7* 10.8* 10.8* 10.3* 9.3*  HCT 32.9* 34.2* 33.6* 32.6* 29.3*  MCV  --   --   --  94.8 95.4  PLT  --   --   --  [id number]   Basic Metabolic Panel: Recent Labs  Lab 01/02/20 0410 01/03/20 0412 01/06/20 0454 01/07/20 0202  NA 138 139 139 141  K 4.1 3.8 4.3 4.1  CL 103 102 102 107  CO2 27 27 27 22   GLUCOSE 113* 108* 104* 89  BUN 14 17 21 18   CREATININE 0.81 0.92 1.44* 1.06*  CALCIUM 8.7* 8.8* 8.4* 8.1*   GFR: Estimated Creatinine Clearance: 33.6 mL/min (A) (by C-G formula based on SCr of 1.06 mg/dL (H)). Liver Function Tests: Recent Labs  Lab 01/06/20 0454  AST 14*  ALT 13  ALKPHOS 64  BILITOT 1.6*  PROT 4.9*  ALBUMIN 2.1*   No results for input(s): LIPASE, AMYLASE in the last 168 hours. No results for input(s): AMMONIA in the last 168 hours. Coagulation Profile: Recent Labs  Lab 01/06/20 0454  INR 1.2   Cardiac Enzymes: No results for input(s): CKTOTAL, CKMB, CKMBINDEX, TROPONINI in the last 168 hours. BNP (last 3  results) No results for input(s): PROBNP in the last 8760 hours. HbA1C: No results for input(s): HGBA1C in the last 72 hours. CBG: Recent Labs  Lab 01/01/20 0650 01/06/20 1535 01/06/20 1911  GLUCAP 87 87 88   Lipid Profile: No results for input(s): CHOL, HDL, LDLCALC, TRIG, CHOLHDL, LDLDIRECT in the last 72 hours. Thyroid Function Tests: No results for input(s): TSH, T4TOTAL, FREET4, T3FREE, THYROIDAB in the last 72 hours. Anemia Panel: No results for input(s): VITAMINB12, FOLATE, FERRITIN, TIBC, IRON, RETICCTPCT in the last 72 hours. Sepsis Labs: Recent Labs  Lab 01/07/20 0202  LATICACIDVEN 0.8    Recent Results (from the past 240 hour(s))  Respiratory Panel by RT PCR (Flu A&B, Covid) - Nasopharyngeal Swab     Status: None   Collection Time: 12/29/19 12:15 PM   Specimen: Nasopharyngeal Swab  Result Value Ref Range Status   SARS Coronavirus 2 by RT PCR NEGATIVE NEGATIVE Final    Comment: (NOTE) SARS-CoV-2 target nucleic acids are NOT DETECTED. The SARS-CoV-2 RNA is generally detectable in upper respiratoy specimens during the acute phase of infection. The lowest concentration of SARS-CoV-2 viral copies this assay can detect is 131 copies/mL. A negative result does not preclude SARS-Cov-2 infection and should not be used as the sole basis for treatment or other patient management decisions. A negative result may occur with  improper specimen collection/handling, submission of specimen other than nasopharyngeal swab, presence of viral mutation(s) within the areas targeted by this assay, and inadequate number of viral copies (<131 copies/mL). A negative result must be combined with clinical observations, patient history, and epidemiological information. The expected result is Negative. Fact Sheet for Patients:  PinkCheek.be Fact Sheet for Healthcare Providers:  GravelBags.it This test is not yet ap proved or  cleared by the [place] FDA and  has been authorized for detection and/or diagnosis of SARS-CoV-2 by FDA under an Emergency Use Authorization (EUA). This EUA will remain  in effect (meaning this test can be used) for the duration of the COVID-19 declaration under Section 564(b)(1) of the Act, 21 U.S.C. section 360bbb-3(b)(1), unless the authorization is terminated or revoked sooner.    Influenza A by PCR NEGATIVE NEGATIVE Final   Influenza B by PCR NEGATIVE NEGATIVE Final    Comment: (NOTE) The Xpert Xpress SARS-CoV-2/FLU/RSV assay is intended as an aid in  the diagnosis of influenza from Nasopharyngeal swab specimens and  should not be used as a sole basis for treatment. Nasal washings and  aspirates are unacceptable for Xpert Xpress SARS-CoV-2/FLU/RSV  testing. Fact Sheet for Patients: PinkCheek.be Fact Sheet for Healthcare Providers: GravelBags.it This test is not yet approved or  cleared by the [place] and  has been authorized for detection and/or diagnosis of SARS-CoV-2 by  FDA under an Emergency Use Authorization (EUA). This EUA will remain  in effect (meaning this test can be used) for the duration of the  Covid-19 declaration under Section 564(b)(1) of the Act, 21  U.S.C. section 360bbb-3(b)(1), unless the authorization is  terminated or revoked. Performed at [facility], [address] [place], [place] [address]   Surgical pcr screen     Status: None   Collection Time: 12/31/19  7:49 PM   Specimen: Nasal Mucosa; Nasal Swab  Result Value Ref Range Status   MRSA, PCR NEGATIVE NEGATIVE Final   Staphylococcus aureus NEGATIVE NEGATIVE Final    Comment: (NOTE) The Xpert SA Assay (FDA approved for NASAL specimens in patients 56 years of age and older), is one component of a comprehensive surveillance program. It is not intended to diagnose infection nor to guide or monitor  treatment. Performed at [facility], [place] [address], [place],  [address]          Radiology Studies: CT ABDOMEN PELVIS W CONTRAST  Result Date: 01/05/2020 CLINICAL DATA:  Abdominal pain, recent fall with known fractures EXAM: CT ABDOMEN AND PELVIS WITH CONTRAST TECHNIQUE: Multidetector CT imaging of the abdomen and pelvis was performed using the standard protocol following bolus administration of intravenous contrast. CONTRAST:  123mL OMNIPAQUE IOHEXOL 300 MG/ML  SOLN COMPARISON:  03/04/2016 FINDINGS: Lower chest: Limited imaging through the lung bases demonstrates minimal hypoventilatory changes. No effusion or pneumothorax. Hepatobiliary: No focal liver abnormality is seen. No gallstones, gallbladder wall thickening, or biliary dilatation. Pancreas: There is diffuse fatty atrophy of the pancreas. No acute abnormalities. Spleen: No splenic injury or perisplenic hematoma. Adrenals/Urinary Tract: Stable right renal cortical cyst. Kidneys enhance normally and symmetrically. No urinary tract calculi or obstructive uropathy. The bladder is unremarkable. Adrenal glands are stable. Stomach/Bowel: Inflammatory changes surround the sigmoid colon consistent with acute diverticulitis. There is extensive pneumoperitoneum consistent with ruptured diverticulum. I do not see any fluid collection or abscess. No bowel obstruction or ileus. Vascular/Lymphatic: Stable atherosclerosis of the abdominal aorta and its branches. No critical stenosis. No pathologic adenopathy within the abdomen or pelvis. Reproductive: Status post hysterectomy. No adnexal masses. Other: There is diffuse pneumoperitoneum consistent with perforated diverticulitis. Trace free fluid within the pelvis. No fluid collection or abscess. Musculoskeletal: Displaced posterolateral left seventh through ninth rib fractures are noted. Wedge compression deformity of the T11 vertebral body appears chronic, as there is no visible fracture line or  paraspinal hematoma. Reconstructed images demonstrate no additional findings. IMPRESSION: 1. Perforated sigmoid diverticulitis with extensive pneumoperitoneum. No fluid collection or abscess. 2. Displaced posterolateral left seventh through ninth rib fractures. 3. Wedge compression deformity of the T11 vertebral body appears chronic, as there is no visible fracture line or paraspinal hematoma. 4. Aortic Atherosclerosis (ICD10-I70.0). These results were called by telephone at the time of interpretation on 01/05/2020 at 7:32 pm to provider Lang Snow, who verbally acknowledged these results. Electronically Signed   By: Randa Ngo M.D.   On: 01/05/2020 19:32        Scheduled Meds: . bupivacaine liposome  20 mL Infiltration Once  . Chlorhexidine Gluconate Cloth  6 each Topical Daily  . enoxaparin (LOVENOX) injection  40 mg Subcutaneous Daily  . levothyroxine  25 mcg Intravenous Daily  . pantoprazole (PROTONIX) IV  40 mg Intravenous Daily  . polyethylene glycol  17 g Oral Daily  .  predniSONE  10 mg Oral Q breakfast  . saccharomyces boulardii  250 mg Oral BID  . sodium chloride flush  3 mL Intravenous Q12H   Continuous Infusions: . sodium chloride 125 mL/hr at 01/07/20 0113  . piperacillin-tazobactam (ZOSYN)  IV 3.375 g (01/07/20 0115)     LOS: 9 days    Time spent: 35 mins.More than 50% of that time was spent in counseling and/or coordination of care.      Shelly Coss, MD [facility] P2/26/2021, 7:57 AM

## 2020-01-08 LAB — HEPARIN LEVEL (UNFRACTIONATED)
Heparin Unfractionated: 0.21 IU/mL — ABNORMAL LOW (ref 0.30–0.70)
Heparin Unfractionated: 0.43 IU/mL (ref 0.30–0.70)

## 2020-01-08 LAB — BASIC METABOLIC PANEL
Anion gap: 9 (ref 5–15)
BUN: 22 mg/dL (ref 8–23)
CO2: 23 mmol/L (ref 22–32)
Calcium: 8.6 mg/dL — ABNORMAL LOW (ref 8.9–10.3)
Chloride: 110 mmol/L (ref 98–111)
Creatinine, Ser: 0.95 mg/dL (ref 0.44–1.00)
GFR calc Af Amer: 59 mL/min — ABNORMAL LOW (ref >60)
GFR calc non Af Amer: 51 mL/min — ABNORMAL LOW (ref >60)
Glucose, Bld: 109 mg/dL — ABNORMAL HIGH (ref 70–99)
Potassium: 3.7 mmol/L (ref 3.5–5.1)
Sodium: 142 mmol/L (ref 135–145)

## 2020-01-08 LAB — CBC WITH DIFFERENTIAL/PLATELET
Abs Immature Granulocytes: 0.46 10*3/uL — ABNORMAL HIGH (ref 0.00–0.07)
Basophils Absolute: 0 10*3/uL (ref 0.0–0.1)
Basophils Relative: 0 %
Eosinophils Absolute: 0 10*3/uL (ref 0.0–0.5)
Eosinophils Relative: 0 %
HCT: 29.3 % — ABNORMAL LOW (ref 36.0–46.0)
Hemoglobin: 9.2 g/dL — ABNORMAL LOW (ref 12.0–15.0)
Immature Granulocytes: 3 %
Lymphocytes Relative: 7 %
Lymphs Abs: 1.1 10*3/uL (ref 0.7–4.0)
MCH: 30.1 pg (ref 26.0–34.0)
MCHC: 31.4 g/dL (ref 30.0–36.0)
MCV: 95.8 fL (ref 80.0–100.0)
Monocytes Absolute: 1 10*3/uL (ref 0.1–1.0)
Monocytes Relative: 6 %
Neutro Abs: 13.5 10*3/uL — ABNORMAL HIGH (ref 1.7–7.7)
Neutrophils Relative %: 84 %
Platelets: 336 10*3/uL (ref 150–400)
RBC: 3.06 MIL/uL — ABNORMAL LOW (ref 3.87–5.11)
RDW: 15.7 % — ABNORMAL HIGH (ref 11.5–15.5)
WBC: 16.1 10*3/uL — ABNORMAL HIGH (ref 4.0–10.5)
nRBC: 0 % (ref 0.0–0.2)

## 2020-01-08 LAB — T4, FREE: Free T4: 1.69 ng/dL — ABNORMAL HIGH (ref 0.61–1.12)

## 2020-01-08 LAB — GLUCOSE, CAPILLARY
Glucose-Capillary: 96 mg/dL (ref 70–99)
Glucose-Capillary: 121 mg/dL — ABNORMAL HIGH (ref 70–99)

## 2020-01-08 MED ORDER — AMIODARONE HCL 200 MG PO TABS
200.0000 mg | ORAL_TABLET | Freq: Every day | ORAL | Status: DC
  Administered 2020-01-08 – 2020-01-14 (×7): 200 mg via ORAL
  Filled 2020-01-08 (×7): qty 1

## 2020-01-08 NOTE — Progress Notes (Signed)
Pharmacy Antibiotic Note  [name] is a 86 y.o. female admitted on 12/29/2019 with intra-abdominal infection, perf sigmoid diverticulitis with purulent peritonitis s/p ex-lap with colectomy and end colostomy 2/25. Pharmacy has been consulted for Zosyn dosing.    WBC trended up, likely due to post-operative inflammation. Afebrile. Cultures negative. Per surgery, needs 5 days abx post-op due to purulent fluid in abd.   Plan: Zosyn 3.375g q8h (4h infusion) Monitor clinical progress, c/s, renal function F/u end date (5d post op = 3/2)  Height: 5\' 6"  (167.6 cm) Weight: 164 lb 14.5 oz (74.8 kg) IBW/kg (Calculated) : 59.3  Temp (24hrs), Avg:98.2 F (36.8 C), Min:98 F (36.7 C), Max:98.6 F (37 C)  Recent Labs  Lab 01/02/20 0410 01/03/20 0412 01/06/20 0454 01/07/20 0202 01/08/20 0553  WBC  --   --  14.2* 13.8* 16.1*  CREATININE 0.81 0.92 1.44* 1.06* 0.95  LATICACIDVEN  --   --   --  0.8  --     Estimated Creatinine Clearance: 37.4 mL/min (by C-G formula based on SCr of 0.95 mg/dL).    Antibiotics:  Zosyn 2/24 >>  Microbiology:  2/27 covid / flu - negative 2/19 surgical PCR - negative   Benetta Spar, PharmD, BCPS, [facility] Clinical Pharmacist  Please check AMION for all [place] phone numbers After 10:00 PM, call [place] [phone]

## 2020-01-08 NOTE — Progress Notes (Signed)
[place] for Heparin Indication: atrial fibrillation  Allergies  Allergen Reactions  . Codeine Other (See Comments)    Nightmares, imagining things  . Keflex [Cephalexin] Nausea Only    Pt ended up in ER w/ CP    Patient Measurements: Height: 5\' 6"  (167.6 cm) Weight: 164 lb 14.5 oz (74.8 kg) IBW/kg (Calculated) : 59.3 Heparin Dosing Weight: 74.8kg  Vital Signs: Temp: 98.3 F (36.8 C) (02/27 1138) Temp Source: Oral (02/27 1138) BP: 131/81 (02/27 1138) Pulse Rate: 109 (02/27 1138)  Labs: Recent Labs    01/06/20 0454 01/06/20 0454 01/07/20 0202 01/08/20 0553 01/08/20 1414  HGB 10.3*   < > 9.3* 9.2*  --   HCT 32.6*  --  29.3* 29.3*  --   PLT 308  --  305 336  --   LABPROT 15.2  --   --   --   --   INR 1.2  --   --   --   --   HEPARINUNFRC  --   --   --  0.21* 0.43  CREATININE 1.44*  --  1.06* 0.95  --    < > = values in this interval not displayed.    Estimated Creatinine Clearance: 37.4 mL/min (by C-G formula based on SCr of 0.95 mg/dL).   Medical History: Past Medical History:  Diagnosis Date  . DVT (deep venous thrombosis) ([place]) 09/2015   RLE  . GERD (gastroesophageal reflux disease)   . Hypertension   . Hypothyroidism   . 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 Peripheral vascular disease ([place])    peripheral neuropathy    Assesment: 86 yo female with new onset afib (CHADS2VASC score 4) who is s/p Ex lap and sigmoid colectomy to start on IV heparin with no boluses per Surgery service.  Heparin level this afternoon came back therapeutic at 0.43, on 1150 units/hr. Hgb 9.2, plt 336. No s/sx of bleeding or infusion issues.    Goal of Therapy:  Heparin level 0.3-0.7 units/ml Monitor platelets by anticoagulation protocol: Yes   Plan:  Continue heparin infusion at 1150 units/hr Will get confirmatory level with AM labs Monitor heparin level, CBC, for s/sx of bleeding   Antonietta Jewel,  PharmD, BCCCP Clinical Pharmacist  Phone: [phone]  Please check AMION for all [place] phone numbers After 10:00 PM, call [place] [phone]

## 2020-01-08 NOTE — Progress Notes (Signed)
Subjective:  Patient denies any chest pain or shortness of breath.  Denies any palpitations remains in A. fib with moderate ventricular response.  States recently had a fall times twice sustaining fracture of the ribs and wrist.  Objective:  Vital Signs in the last 24 hours: Temp:  [98 F (36.7 C)-98.3 F (36.8 C)] 98.3 F (36.8 C) (02/27 1138) Pulse Rate:  [84-151] 109 (02/27 1138) Resp:  [16-23] 17 (02/27 1138) BP: (93-131)/(53-81) 131/81 (02/27 1138) SpO2:  [92 %-99 %] 96 % (02/27 1138)  Intake/Output from previous day: 02/26 0701 - 02/27 0700 In: 1489.4 [P.O.:240; I.V.:1149.4; IV Piggyback:100] Out: 1800 [Urine:1800] Intake/Output from this shift: Total I/O In: 1211.2 [P.O.:240; I.V.:921.2; IV Piggyback:50] Out: 0   Physical Exam: Neck: no adenopathy, no carotid bruit, no JVD and supple, symmetrical, trachea midline Lungs: Clear to auscultation anteriorly Heart: irregularly irregular rhythm, S1, S2 normal and 2/6 systolic murmur noted Abdomen: soft, non-tender; bowel sounds normal; no masses,  no organomegaly Extremities: extremities normal, atraumatic, no cyanosis or edema and Left forearm cast noted  Lab Results: Recent Labs    01/07/20 0202 01/08/20 0553  WBC 13.8* 16.1*  HGB 9.3* 9.2*  PLT 305 336   Recent Labs    01/07/20 0202 01/08/20 0553  NA 141 142  K 4.1 3.7  CL 107 110  CO2 22 23  GLUCOSE 89 109*  BUN 18 22  CREATININE 1.06* 0.95   No results for input(s): TROPONINI in the last 72 hours.  Invalid input(s): CK, MB Hepatic Function Panel Recent Labs    01/06/20 0454  PROT 4.9*  ALBUMIN 2.1*  AST 14*  ALT 13  ALKPHOS 64  BILITOT 1.6*   No results for input(s): CHOL in the last 72 hours. No results for input(s): PROTIME in the last 72 hours.  Imaging: Imaging results have been reviewed and No results found.  Cardiac Studies:  Assessment/Plan:  Atrial fibrillation with RVR, CHA2DS2VASc score of 4 Acute perforated diverticulitis with  peritonitis S/P Hartmann's procedure CKD IIIa HTN Hypothyroidism Polymyalgia Rheumatica Plan Add low-dose amiodarone as per orders Continue IV heparin for now Will discuss with patient and family regarding chronic anticoagulation its risk and benefits.  LOS: 10 days    Charolette Forward 01/08/2020, 12:09 PM

## 2020-01-08 NOTE — Progress Notes (Signed)
PROGRESS NOTE    [name]  [id number] DOB: [birth year] DOA: 12/29/2019 PCP: [name], MD   Brief Narrative:  Patient is a 86 year old female with history of hypertension, hypothyroidism, polymyalgia rheumatica, CKD 3A who presented initially to [facility] for the evaluation of severe left wrist pain after a fall.  She was also having abdominal pain, diarrhea for last few days, lightheaded upon standing and fell multiple times.  When she presented, she was found to have AKI, distal left radius fracture.  She also had left lateral rib fractures.  Orthopedics was consulted and she underwent ORIF of left radial fracture.  Physical therapy recommended CIR but insurance denied.  We were planning for discharging to home with home health.  On 01/04/2019 1 AM, she developed severe lower abdominal pain.  CT abdomen/pelvis showed perforated sigmoid diverticulitis with pneumoperitoneum.  General surgery consulted.  After discussion with family, general surgery initially decided for conservative management. She had worsening leukocytosis, became hypotensive and developed AKI.  She underwent exploratory laparotomy, sigmoid colectomy with end colostomy by Dr. Redmond Pulling on 01/06/2020. Hospital course also remarkable for A. fib with RVR.  Cardiology been consulted. Started on heparin drip.  Assessment & Plan:   Principal Problem:   Acute renal failure superimposed on stage 3a chronic kidney disease (HCC) Active Problems:   Hypothyroidism   HTN (hypertension)   Preoperative cardiovascular examination   Closed fracture of multiple ribs   Closed fracture of left distal radius   Recurrent falls   Perforated sigmoid diverticulitis/pneumoperitoneum: On 01/04/2019 1 AM, she developed severe lower abdominal pain.  CT abdomen/pelvis showed perforated sigmoid diverticulitis with pneumoperitoneum.  General surgery consulted.  After discussion with family, general surgery initially decided for  conservative management. She had worsening leukocytosis, became hypotensive and developed AKI.  She underwent exploratory laparotomy, sigmoid colectomy with end colostomy by Dr. Redmond Pulling on 01/06/2020 Continue Zosyn, IV fluids. Elevated white cell count today, she is afebrile. Will send blood cultures  Afib with RVR: Transient A. fib most likely secondary to present medical/surgical illness. Currently on IV metoprolol. Cardiology consulted. Started on heparin drip. Given her age and comorbidities,starting  anticoagulation could be extremely unsafe for her.  I have discussed with cardiology.  Most likely she will be started on amiodarone.   AKI on CKD stage IIIa: Resolved.  Acute hypoxic respiratory failure: Most likely secondary to pain/atelectasis from rib fractures.  Continue incentive spirometry.  Chest x-rays did not show any acute infection but showed atelectasis of left lung base.  Currently on 2 L of oxygen per minute. We will try to wean her to room air.  Distal left radius fracture: Secondary to fall.  X-ray showed oblique fracture of the distal radius at the proximal end of the plate and screws in place for fixation of remote healed fracture.  Currently in sling.  Status post ORIF by orthopedics.  Removal of left distal radius hardware deep implants.  Orthopedics recommended not weightbearing of left upper extremity .  Follow-up recommended in 2 weeks.  Continue pain management. PT/OT recommended had CIR but insurance denied.   Social worker has been consulted.  Lateral left rib fractures: Fracture of lateral eighth, ninth ribs on chest x-ray.  Continue pain management, incentive spirometry  Polymyalgia rheumatica: Continue home dose of prednisone  Hypertension: Blood pressure stable.  BP medications held  Hypothyroidism: Thyroid panel showed low TSH, high free T4.  Follow-up free T3.  Will discontinue Synthyroid, patient might be in hyperthyroid state  at present.  Dizziness/fall:  Suspected secondary to dehydration.  Echocardiogram showed ejection fraction [id number], grade 1 diastolic dysfunction.  Orthostatic vitals were negative echo also showed regional wall motion abnormalities, apical septal and anterior hypokinesis. No further work-up needed.  Normocytic anemia: Hemoglobin stable.  Continue to monitor.          DVT prophylaxis:Heparin [place] Code Status: Full Family Communication: Son on phone disposition Plan: Patient is from home.  Underwent surgery on 01/06/2020, not stable for discharge.  Needs surgical clearance before discharge.  PT/OT recommended CIR. She has been denied CIR by her insurance. Discharge planning to home with home health versus skilled nursing facility after she is medically stable.  Consultants: Orthopedics, cardiology  Procedures: ORIF, exploratory laparotomy  Antimicrobials:  Anti-infectives (From admission, onward)   Start     Dose/Rate Route Frequency Ordered Stop   01/06/20 0230  piperacillin-tazobactam (ZOSYN) IVPB 3.375 g     3.375 g 12.5 mL/hr over 240 Minutes Intravenous Every 8 hours 01/05/20 1952     01/05/20 2000  piperacillin-tazobactam (ZOSYN) IVPB 3.375 g     3.375 g 100 mL/hr over 30 Minutes Intravenous  Once 01/05/20 1952 01/05/20 2049   01/01/20 2030  ceFAZolin (ANCEF) IVPB 1 g/50 mL premix  Status:  Discontinued     1 g 100 mL/hr over 30 Minutes Intravenous Every 8 hours 01/01/20 1307 01/03/20 1357   01/01/20 1315  ceFAZolin (ANCEF) IVPB 1 g/50 mL premix     1 g 100 mL/hr over 30 Minutes Intravenous NOW 01/01/20 1307 01/01/20 1454   01/01/20 0600  ceFAZolin (ANCEF) IVPB 2g/100 mL premix  Status:  Discontinued     2 g 200 mL/hr over 30 Minutes Intravenous To Short Stay 12/31/19 1928 01/01/20 1259   01/01/20 0600  vancomycin (VANCOCIN) IVPB 1000 mg/200 mL premix     1,000 mg 200 mL/hr over 60 Minutes Intravenous To Short Stay 12/31/19 1928 01/01/20 1055      Subjective:   Objective: Vitals:   01/07/20 2259  01/08/20 0000 01/08/20 0400 01/08/20 0447  BP: 116/71   113/63  Pulse: 96   (!) 103  Resp: (!) 23   16  Temp: 98.2 F (36.8 C) 98.2 F (36.8 C) 98 F (36.7 C) 98.3 F (36.8 C)  TempSrc: Oral Oral Oral Oral  SpO2: 96%   92%  Weight:      Height:        Intake/Output Summary (Last 24 hours) at 01/08/2020 0739 Last data filed at 01/08/2020 [id number] Gross per 24 hour  Intake 1489.4 ml  Output 1800 ml  Net -310.6 ml   Filed Weights   12/31/19 2115 01/04/20 2047 01/05/20 2057  Weight: 71.7 kg 70.8 kg 74.8 kg    Examination:  General exam: Appears calm and comfortable ,Not in distress Respiratory system:  no wheezes or crackles  Cardiovascular system: Afib. No JVD, murmurs, rubs, gallops or clicks. Gastrointestinal system: Abdomen is nondistended, soft and appropriately tender.  Colostomy, dressings.   Central nervous system: Alert and oriented. No focal neurological deficits. Extremities: No edema, no clubbing ,no cyanosis Skin: No rashes, lesions or ulcers,no icterus ,no pallor     Data Reviewed: I have personally reviewed following labs and imaging studies  CBC: Recent Labs  Lab 01/03/20 0412 01/04/20 0509 01/06/20 0454 01/07/20 0202 01/08/20 0553  WBC  --   --  14.2* 13.8* 16.1*  NEUTROABS  --   --  12.3* 12.3* 13.5*  HGB 10.8* 10.8* 10.3* 9.3* 9.2*  HCT 34.2* 33.6* 32.6* 29.3* 29.3*  MCV  --   --  94.8 95.4 95.8  PLT  --   --  308 305 [id number]   Basic Metabolic Panel: Recent Labs  Lab 01/02/20 0410 01/03/20 0412 01/06/20 0454 01/07/20 0202 01/08/20 0553  NA 138 139 139 141 142  K 4.1 3.8 4.3 4.1 3.7  CL 103 102 102 107 110  CO2 27 27 27 22 23   GLUCOSE 113* 108* 104* 89 109*  BUN 14 17 21 18 22   CREATININE 0.81 0.92 1.44* 1.06* 0.95  CALCIUM 8.7* 8.8* 8.4* 8.1* 8.6*   GFR: Estimated Creatinine Clearance: 37.4 mL/min (by C-G formula based on SCr of 0.95 mg/dL). Liver Function Tests: Recent Labs  Lab 01/06/20 0454  AST 14*  ALT 13  ALKPHOS 64    BILITOT 1.6*  PROT 4.9*  ALBUMIN 2.1*   No results for input(s): LIPASE, AMYLASE in the last 168 hours. No results for input(s): AMMONIA in the last 168 hours. Coagulation Profile: Recent Labs  Lab 01/06/20 0454  INR 1.2   Cardiac Enzymes: No results for input(s): CKTOTAL, CKMB, CKMBINDEX, TROPONINI in the last 168 hours. BNP (last 3 results) No results for input(s): PROBNP in the last 8760 hours. HbA1C: No results for input(s): HGBA1C in the last 72 hours. CBG: Recent Labs  Lab 01/06/20 1535 01/06/20 1911 01/08/20 0624  GLUCAP 87 88 96   Lipid Profile: No results for input(s): CHOL, HDL, LDLCALC, TRIG, CHOLHDL, LDLDIRECT in the last 72 hours. Thyroid Function Tests: Recent Labs    01/07/20 1036 01/08/20 0553  TSH 0.187*  --   FREET4  --  1.69*   Anemia Panel: No results for input(s): VITAMINB12, FOLATE, FERRITIN, TIBC, IRON, RETICCTPCT in the last 72 hours. Sepsis Labs: Recent Labs  Lab 01/07/20 0202  LATICACIDVEN 0.8    Recent Results (from the past 240 hour(s))  Respiratory Panel by RT PCR (Flu A&B, Covid) - Nasopharyngeal Swab     Status: None   Collection Time: 12/29/19 12:15 PM   Specimen: Nasopharyngeal Swab  Result Value Ref Range Status   SARS Coronavirus 2 by RT PCR NEGATIVE NEGATIVE Final    Comment: (NOTE) SARS-CoV-2 target nucleic acids are NOT DETECTED. The SARS-CoV-2 RNA is generally detectable in upper respiratoy specimens during the acute phase of infection. The lowest concentration of SARS-CoV-2 viral copies this assay can detect is 131 copies/mL. A negative result does not preclude SARS-Cov-2 infection and should not be used as the sole basis for treatment or other patient management decisions. A negative result may occur with  improper specimen collection/handling, submission of specimen other than nasopharyngeal swab, presence of viral mutation(s) within the areas targeted by this assay, and inadequate number of viral copies (<131  copies/mL). A negative result must be combined with clinical observations, patient history, and epidemiological information. The expected result is Negative. Fact Sheet for Patients:  PinkCheek.be Fact Sheet for Healthcare Providers:  GravelBags.it This test is not yet ap proved or cleared by the [place] FDA and  has been authorized for detection and/or diagnosis of SARS-CoV-2 by FDA under an Emergency Use Authorization (EUA). This EUA will remain  in effect (meaning this test can be used) for the duration of the COVID-19 declaration under Section 564(b)(1) of the Act, 21 U.S.C. section 360bbb-3(b)(1), unless the authorization is terminated or revoked sooner.    Influenza A by PCR NEGATIVE NEGATIVE Final   Influenza B by PCR NEGATIVE NEGATIVE Final    Comment: (  NOTE) The Xpert Xpress SARS-CoV-2/FLU/RSV assay is intended as an aid in  the diagnosis of influenza from Nasopharyngeal swab specimens and  should not be used as a sole basis for treatment. Nasal washings and  aspirates are unacceptable for Xpert Xpress SARS-CoV-2/FLU/RSV  testing. Fact Sheet for Patients: PinkCheek.be Fact Sheet for Healthcare Providers: GravelBags.it This test is not yet approved or cleared by the [place] FDA and  has been authorized for detection and/or diagnosis of SARS-CoV-2 by  FDA under an Emergency Use Authorization (EUA). This EUA will remain  in effect (meaning this test can be used) for the duration of the  Covid-19 declaration under Section 564(b)(1) of the Act, 21  U.S.C. section 360bbb-3(b)(1), unless the authorization is  terminated or revoked. Performed at [facility], [address], [place], [place] [address]   Surgical pcr screen     Status: None   Collection Time: 12/31/19  7:49 PM   Specimen: Nasal Mucosa; Nasal Swab  Result Value Ref Range  Status   MRSA, PCR NEGATIVE NEGATIVE Final   Staphylococcus aureus NEGATIVE NEGATIVE Final    Comment: (NOTE) The Xpert SA Assay (FDA approved for NASAL specimens in patients 40 years of age and older), is one component of a comprehensive surveillance program. It is not intended to diagnose infection nor to guide or monitor treatment. Performed at [facility], [place] [address] [place], [place] [address]          Radiology Studies: No results found.      Scheduled Meds: . bupivacaine liposome  20 mL Infiltration Once  . Chlorhexidine Gluconate Cloth  6 each Topical Daily  . levothyroxine  25 mcg Intravenous Daily  . metoprolol tartrate  2.5 mg Intravenous Q6H  . pantoprazole (PROTONIX) IV  40 mg Intravenous Daily  . polyethylene glycol  17 g Oral Daily  . predniSONE  10 mg Oral Q breakfast  . saccharomyces boulardii  250 mg Oral BID  . sodium chloride flush  3 mL Intravenous Q12H   Continuous Infusions: . sodium chloride 75 mL/hr at 01/07/20 1944  . heparin 1,050 Units/hr (01/07/20 2232)  . piperacillin-tazobactam (ZOSYN)  IV 3.375 g (01/08/20 0223)     LOS: 10 days    Time spent: 35 mins.More than 50% of that time was spent in counseling and/or coordination of care.      Shelly Coss, MD [facility] P2/27/2021, 7:39 AM

## 2020-01-08 NOTE — Progress Notes (Signed)
[place] for Heparin Indication: atrial fibrillation  Allergies  Allergen Reactions  . Codeine Other (See Comments)    Nightmares, imagining things  . Keflex [Cephalexin] Nausea Only    Pt ended up in ER w/ CP    Patient Measurements: Height: 5\' 6"  (167.6 cm) Weight: 164 lb 14.5 oz (74.8 kg) IBW/kg (Calculated) : 59.3 Heparin Dosing Weight: 74.8kg  Vital Signs: Temp: 98.3 F (36.8 C) (02/27 0447) Temp Source: Oral (02/27 0447) BP: 113/63 (02/27 0447) Pulse Rate: 103 (02/27 0447)  Labs: Recent Labs    01/06/20 0454 01/06/20 0454 01/07/20 0202 01/08/20 0553  HGB 10.3*   < > 9.3* 9.2*  HCT 32.6*  --  29.3* 29.3*  PLT 308  --  305 336  LABPROT 15.2  --   --   --   INR 1.2  --   --   --   HEPARINUNFRC  --   --   --  0.21*  CREATININE 1.44*  --  1.06* 0.95   < > = values in this interval not displayed.    Estimated Creatinine Clearance: 37.4 mL/min (by C-G formula based on SCr of 0.95 mg/dL).   Medical History: Past Medical History:  Diagnosis Date  . DVT (deep venous thrombosis) ([place]) 09/2015   RLE  . GERD (gastroesophageal reflux disease)   . Hypertension   . Hypothyroidism   . 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 Peripheral vascular disease ([place])    peripheral neuropathy    Assesment: 86 yo female with new onset afib (CHADS2VASC score 4) who is s/p Ex lap and sigmoid colectomy to start on IV heparin with no boluses per Surgery service.  2/27 AM update:  Initial heparin level low No issues per RN   Goal of Therapy:  Heparin level 0.3-0.7 units/ml Monitor platelets by anticoagulation protocol: Yes   Plan:  Inc heparin to 1150 units/hr Re-check heparin level at [place], PharmD, [place] Pharmacist Phone: [phone]

## 2020-01-08 NOTE — Progress Notes (Signed)
Patient received in bed alert and well related, offered no complaints, abdominal wound intact, abdomen slightly distented plus bowel sounds , colostomy ULQ no output noted but stoma red with good tissue granulation.Patient DTV s/p removal of foley on prior shift. IVF ongoing NS@75cc /hr and IV Heparin prophylaxis.

## 2020-01-08 NOTE — Progress Notes (Signed)
patinet c/o of pressure but unable to void, Straight cath of 500cc, givem oxycodone x1 for pain with good results. IVF ongoing @75cchr  anf Heparin drip @10 .5 via peripheral access.

## 2020-01-08 NOTE — Progress Notes (Signed)
2 Days Post-Op   Subjective/Chief Complaint: No bowel function yet No nausea Pain under good control On heparin gtt   Objective: Vital signs in last 24 hours: Temp:  [98 F (36.7 C)-98.6 F (37 C)] 98.1 F (36.7 C) (02/27 0800) Pulse Rate:  [75-151] 98 (02/27 0800) Resp:  [15-23] 20 (02/27 0800) BP: (93-116)/(53-76) 109/76 (02/27 0800) SpO2:  [92 %-99 %] 92 % (02/27 0800) Last BM Date: 01/05/20  Intake/Output from previous day: 02/26 0701 - 02/27 0700 In: 1489.4 [P.O.:240; I.V.:1149.4; IV Piggyback:100] Out: 1800 [Urine:1800] Intake/Output this shift: No intake/output data recorded.  General appearance: alert, cooperative and no distress GI: soft, quiet, incisional tenderness Midline wound - clean, no granulation tissue yet; ostomy pink viable, no stool output  Lab Results:  Recent Labs    01/07/20 0202 01/08/20 0553  WBC 13.8* 16.1*  HGB 9.3* 9.2*  HCT 29.3* 29.3*  PLT 305 336   BMET Recent Labs    01/07/20 0202 01/08/20 0553  NA 141 142  K 4.1 3.7  CL 107 110  CO2 22 23  GLUCOSE 89 109*  BUN 18 22  CREATININE 1.06* 0.95  CALCIUM 8.1* 8.6*   PT/INR Recent Labs    01/06/20 0454  LABPROT 15.2  INR 1.2   ABG No results for input(s): PHART, HCO3 in the last 72 hours.  Invalid input(s): PCO2, PO2  Studies/Results: No results found.  Anti-infectives: Anti-infectives (From admission, onward)   Start     Dose/Rate Route Frequency Ordered Stop   01/06/20 0230  piperacillin-tazobactam (ZOSYN) IVPB 3.375 g     3.375 g 12.5 mL/hr over 240 Minutes Intravenous Every 8 hours 01/05/20 1952     01/05/20 2000  piperacillin-tazobactam (ZOSYN) IVPB 3.375 g     3.375 g 100 mL/hr over 30 Minutes Intravenous  Once 01/05/20 1952 01/05/20 2049   01/01/20 2030  ceFAZolin (ANCEF) IVPB 1 g/50 mL premix  Status:  Discontinued     1 g 100 mL/hr over 30 Minutes Intravenous Every 8 hours 01/01/20 1307 01/03/20 1357   01/01/20 1315  ceFAZolin (ANCEF) IVPB 1 g/50 mL  premix     1 g 100 mL/hr over 30 Minutes Intravenous NOW 01/01/20 1307 01/01/20 1454   01/01/20 0600  ceFAZolin (ANCEF) IVPB 2g/100 mL premix  Status:  Discontinued     2 g 200 mL/hr over 30 Minutes Intravenous To Short Stay 12/31/19 1928 01/01/20 1259   01/01/20 0600  vancomycin (VANCOCIN) IVPB 1000 mg/200 mL premix     1,000 mg 200 mL/hr over 60 Minutes Intravenous To Short Stay 12/31/19 1928 01/01/20 1055      Assessment/Plan: HTN Hypothyroidism Hx DVT in 2016 GERD PVD Recent fall w/ left wrist fx s/pL wrist repair by Dr. Caralyn Guile on 2/20 Polymyalgia rheumaticaon PO Prednisone - hold prednisone if able  CKD w/ AKI - improved. Cr 0.95 New onset A. Fib - on heparin gtt ABL anemia - monitor while on heparin  Perforated sigmoid diverticulitis with purulent peritonitis - S/p ex lap, Sigmoid colectomy with end colostomy -- Hartmann procedure - Dr. Redmond Pulling - 01/06/2020 - POD #2 - Foley is out - Cont IV abx  - Allow CLD and oral meds until bowel function resumes - Mobilize. Reordered PT/OT - Pulm toilet, IS - WOCN consulted - BID WTD for midline dressing  FEN -CLD, IVF VTE -SCDs, heparin gtt ID -Zosyn 2/24 >> Foley - d/c Follow-Up - Dr. Redmond Pulling   LOS: 10 days    Maia Petties 01/08/2020

## 2020-01-09 LAB — CBC WITH DIFFERENTIAL/PLATELET
Abs Immature Granulocytes: 0.91 10*3/uL — ABNORMAL HIGH (ref 0.00–0.07)
Basophils Absolute: 0.1 10*3/uL (ref 0.0–0.1)
Basophils Relative: 1 %
Eosinophils Absolute: 0.2 10*3/uL (ref 0.0–0.5)
Eosinophils Relative: 1 %
HCT: 31.8 % — ABNORMAL LOW (ref 36.0–46.0)
Hemoglobin: 9.8 g/dL — ABNORMAL LOW (ref 12.0–15.0)
Immature Granulocytes: 7 %
Lymphocytes Relative: 14 %
Lymphs Abs: 1.9 10*3/uL (ref 0.7–4.0)
MCH: 30.1 pg (ref 26.0–34.0)
MCHC: 30.8 g/dL (ref 30.0–36.0)
MCV: 97.5 fL (ref 80.0–100.0)
Monocytes Absolute: 1.3 10*3/uL — ABNORMAL HIGH (ref 0.1–1.0)
Monocytes Relative: 9 %
Neutro Abs: 9.2 10*3/uL — ABNORMAL HIGH (ref 1.7–7.7)
Neutrophils Relative %: 68 %
Platelets: 406 10*3/uL — ABNORMAL HIGH (ref 150–400)
RBC: 3.26 MIL/uL — ABNORMAL LOW (ref 3.87–5.11)
RDW: 15.3 % (ref 11.5–15.5)
WBC: 13.6 10*3/uL — ABNORMAL HIGH (ref 4.0–10.5)
nRBC: 0 % (ref 0.0–0.2)

## 2020-01-09 LAB — HEPARIN LEVEL (UNFRACTIONATED): Heparin Unfractionated: 0.34 IU/mL (ref 0.30–0.70)

## 2020-01-09 MED ORDER — METOPROLOL TARTRATE 12.5 MG HALF TABLET
12.5000 mg | ORAL_TABLET | Freq: Two times a day (BID) | ORAL | Status: DC
  Administered 2020-01-09 – 2020-01-10 (×3): 12.5 mg via ORAL
  Filled 2020-01-09 (×3): qty 1

## 2020-01-09 NOTE — Progress Notes (Signed)
Subjective:  Patient denies any chest pain or shortness of breath states feels better today.  Remains in A. fib heart rate in 90s  Objective:  Vital Signs in the last 24 hours: Temp:  [97.9 F (36.6 C)-98.4 F (36.9 C)] 98.2 F (36.8 C) (02/28 0800) Pulse Rate:  [76-111] 94 (02/28 0800) Resp:  [16-27] 22 (02/28 0800) BP: (116-133)/(64-89) 133/76 (02/28 0800) SpO2:  [92 %-100 %] 100 % (02/28 0800)  Intake/Output from previous day: 02/27 0701 - 02/28 0700 In: 2613 [P.O.:720; I.V.:1793; IV Piggyback:100] Out: 800 [Urine:800] Intake/Output from this shift: No intake/output data recorded.  Physical Exam: Exam unchanged  Lab Results: Recent Labs    01/08/20 0553 01/09/20 0531  WBC 16.1* 13.6*  HGB 9.2* 9.8*  PLT 336 406*   Recent Labs    01/07/20 0202 01/08/20 0553  NA 141 142  K 4.1 3.7  CL 107 110  CO2 22 23  GLUCOSE 89 109*  BUN 18 22  CREATININE 1.06* 0.95   No results for input(s): TROPONINI in the last 72 hours.  Invalid input(s): CK, MB Hepatic Function Panel No results for input(s): PROT, ALBUMIN, AST, ALT, ALKPHOS, BILITOT, BILIDIR, IBILI in the last 72 hours. No results for input(s): CHOL in the last 72 hours. No results for input(s): PROTIME in the last 72 hours.  Imaging: Imaging results have been reviewed and No results found.  Cardiac Studies:  Assessment/Plan:  Atrial fibrillation with moderate ventricular response CHA2DS2VASc score of 4 Acute perforated diverticulitis with peritonitis S/P Hartmann's procedure CKD IIIa improved HTN Hypothyroidism Polymyalgia Rheumatica Plan Add low-dose p.o. beta-blockers as per orders Dr. Doylene Canard to follow from a.m.  LOS: 11 days    [name] 01/09/2020, 10:06 AM

## 2020-01-09 NOTE — Progress Notes (Signed)
3 Days Post-Op   Subjective/Chief Complaint: abd feels a little tight.  No n/v, but some belching.    Objective: Vital signs in last 24 hours: Temp:  [97.9 F (36.6 C)-98.4 F (36.9 C)] 98.2 F (36.8 C) (02/28 1145) Pulse Rate:  [76-111] 106 (02/28 1145) Resp:  [16-27] 20 (02/28 1145) BP: (116-133)/(64-89) 125/88 (02/28 1145) SpO2:  [91 %-100 %] 91 % (02/28 1145) Last BM Date: 01/05/20  Intake/Output from previous day: 02/27 0701 - 02/28 0700 In: 2613 [P.O.:720; I.V.:1793; IV Piggyback:100] Out: 800 [Urine:800] Intake/Output this shift: No intake/output data recorded.  General appearance: alert, cooperative and no distress GI: soft, quiet, incisional tenderness Midline wound - clean, no granulation tissue yet; ostomy pink viable, no stool output  Lab Results:  Recent Labs    01/08/20 0553 01/09/20 0531  WBC 16.1* 13.6*  HGB 9.2* 9.8*  HCT 29.3* 31.8*  PLT 336 406*   BMET Recent Labs    01/07/20 0202 01/08/20 0553  NA 141 142  K 4.1 3.7  CL 107 110  CO2 22 23  GLUCOSE 89 109*  BUN 18 22  CREATININE 1.06* 0.95  CALCIUM 8.1* 8.6*   PT/INR No results for input(s): LABPROT, INR in the last 72 hours. ABG No results for input(s): PHART, HCO3 in the last 72 hours.  Invalid input(s): PCO2, PO2  Studies/Results: No results found.  Anti-infectives: Anti-infectives (From admission, onward)   Start     Dose/Rate Route Frequency Ordered Stop   01/06/20 0230  piperacillin-tazobactam (ZOSYN) IVPB 3.375 g     3.375 g 12.5 mL/hr over 240 Minutes Intravenous Every 8 hours 01/05/20 1952     01/05/20 2000  piperacillin-tazobactam (ZOSYN) IVPB 3.375 g     3.375 g 100 mL/hr over 30 Minutes Intravenous  Once 01/05/20 1952 01/05/20 2049   01/01/20 2030  ceFAZolin (ANCEF) IVPB 1 g/50 mL premix  Status:  Discontinued     1 g 100 mL/hr over 30 Minutes Intravenous Every 8 hours 01/01/20 1307 01/03/20 1357   01/01/20 1315  ceFAZolin (ANCEF) IVPB 1 g/50 mL premix     1  g 100 mL/hr over 30 Minutes Intravenous NOW 01/01/20 1307 01/01/20 1454   01/01/20 0600  ceFAZolin (ANCEF) IVPB 2g/100 mL premix  Status:  Discontinued     2 g 200 mL/hr over 30 Minutes Intravenous To Short Stay 12/31/19 1928 01/01/20 1259   01/01/20 0600  vancomycin (VANCOCIN) IVPB 1000 mg/200 mL premix     1,000 mg 200 mL/hr over 60 Minutes Intravenous To Short Stay 12/31/19 1928 01/01/20 1055      Assessment/Plan: HTN Hypothyroidism Hx DVT in 2016 GERD PVD Recent fall w/ left wrist fx s/pL wrist repair by Dr. Caralyn Guile on 2/20 Polymyalgia rheumaticaon PO Prednisone - continue to hold prednisone if able  CKD w/ AKI - improved. Cr 0.95 2/27 New onset A. Fib - on heparin gtt ABL anemia - monitor while on heparin  Perforated sigmoid diverticulitis with purulent peritonitis - S/p ex lap, Sigmoid colectomy with end colostomy -- Hartmann procedure - Dr. Redmond Pulling - 01/06/2020 - POD #3 - Foley is out - Cont IV abx for gross purulent peritonitis - Allow CLD and oral meds until bowel function resumes - Mobilize.  PT/OT - Pulm toilet, IS - WOCN consulted - BID WTD for midline dressing  FEN -CLD, IVF VTE -SCDs, heparin gtt ID -Zosyn 2/24 >> Follow-Up - Dr. Redmond Pulling   LOS: 11 days    Stark Klein 01/09/2020

## 2020-01-09 NOTE — Progress Notes (Signed)
PROGRESS NOTE    [name]  [id number] DOB: Jan 05, 1923 DOA: 12/29/2019 PCP: [name], MD   Brief Narrative:  Patient is a 86 year old female with history of hypertension, hypothyroidism, polymyalgia rheumatica, CKD 3A who presented initially to [place] for the evaluation of severe left wrist pain after a fall.  She was also having abdominal pain, diarrhea for last few days, lightheaded upon standing and fell multiple times.  When she presented, she was found to have AKI, distal left radius fracture.  She also had left lateral rib fractures.  Orthopedics was consulted and she underwent ORIF of left radial fracture.  Physical therapy recommended CIR but insurance denied.  We were planning for discharging to home with home health.  On 01/04/2019 1 AM, she developed severe lower abdominal pain.  CT abdomen/pelvis showed perforated sigmoid diverticulitis with pneumoperitoneum.  General surgery consulted.  After discussion with family, general surgery initially decided for conservative management. She had worsening leukocytosis, became hypotensive and developed AKI.  She underwent exploratory laparotomy, sigmoid colectomy with end colostomy by Dr. Redmond Pulling on 01/06/2020. Hospital course also remarkable for A. fib with RVR.  Cardiology  Consulted and following. Started on heparin drip. Awaiting return of bowel movement.  Assessment & Plan:   Principal Problem:   Acute renal failure superimposed on stage 3a chronic kidney disease (HCC) Active Problems:   Hypothyroidism   HTN (hypertension)   Preoperative cardiovascular examination   Closed fracture of multiple ribs   Closed fracture of left distal radius   Recurrent falls   Perforated sigmoid diverticulitis/pneumoperitoneum: On 01/04/2019 1 AM, she developed severe lower abdominal pain.  CT abdomen/pelvis showed perforated sigmoid diverticulitis with pneumoperitoneum.  General surgery consulted.  After discussion with  family, general surgery initially decided for conservative management. She had worsening leukocytosis, became hypotensive and developed AKI.  She underwent exploratory laparotomy, sigmoid colectomy with end colostomy by Dr. Redmond Pulling on 01/06/2020 Continue Zosyn, IV fluids. She has mild leucocytosis.Afebrile  Afib with RVR: Transient A. fib most likely secondary to present medical/surgical illness. Currently on oral metoprolol. Cardiology consulted. Started on heparin drip. Given her age and comorbidities,starting  anticoagulation could be extremely unsafe for her.Started on amiodarone.  AKI on CKD stage IIIa: Resolved.  Acute hypoxic respiratory failure: Most likely secondary to pain/atelectasis from rib fractures.  Continue incentive spirometry.  Chest x-rays did not show any acute infection but showed atelectasis of left lung base.  Currently on room air.  Distal left radius fracture: Secondary to fall.  X-ray showed oblique fracture of the distal radius at the proximal end of the plate and screws in place for fixation of remote healed fracture.  Currently in sling.  Status post ORIF by orthopedics.  Removal of left distal radius hardware deep implants.  Orthopedics recommended not weightbearing of left upper extremity .  Follow-up recommended in 2 weeks.  Continue pain management. PT/OT recommended had CIR but insurance denied.   Social worker has been consulted.  Lateral left rib fractures: Fracture of lateral eighth, ninth ribs on chest x-ray.  Continue pain management, incentive spirometry  Polymyalgia rheumatica: Continue home dose of prednisone  Hypertension: Blood pressure stable.  BP medications on hold.  Hypothyroidism: Thyroid panel showed low TSH, high free T4.  Follow-up free T3.  Will discontinue Synthyroid, patient might be in hyperthyroid state at present.  Dizziness/fall: Suspected secondary to dehydration.  Echocardiogram showed ejection fraction [id number], grade 1 diastolic  dysfunction.  Orthostatic vitals were negative echo also showed  regional wall motion abnormalities, apical septal and anterior hypokinesis. No further work-up needed.  Normocytic anemia: Hemoglobin stable.  Continue to monitor.          DVT prophylaxis:Heparin  Code Status: Full Family Communication: Son on phone on 01/08/20 disposition Plan: Patient is from home.  Underwent surgery on 01/06/2020.  She is awaiting bowel function and needs surgical clearance before discharge.  PT/OT recommended CIR. She has been denied CIR by her insurance. Discharge planning to home with home health versus after she is medically stable.  Consultants: Orthopedics, cardiology  Procedures: ORIF, exploratory laparotomy  Antimicrobials:  Anti-infectives (From admission, onward)   Start     Dose/Rate Route Frequency Ordered Stop   01/06/20 0230  piperacillin-tazobactam (ZOSYN) IVPB 3.375 g     3.375 g 12.5 mL/hr over 240 Minutes Intravenous Every 8 hours 01/05/20 1952     01/05/20 2000  piperacillin-tazobactam (ZOSYN) IVPB 3.375 g     3.375 g 100 mL/hr over 30 Minutes Intravenous  Once 01/05/20 1952 01/05/20 2049   01/01/20 2030  ceFAZolin (ANCEF) IVPB 1 g/50 mL premix  Status:  Discontinued     1 g 100 mL/hr over 30 Minutes Intravenous Every 8 hours 01/01/20 1307 01/03/20 1357   01/01/20 1315  ceFAZolin (ANCEF) IVPB 1 g/50 mL premix     1 g 100 mL/hr over 30 Minutes Intravenous NOW 01/01/20 1307 01/01/20 1454   01/01/20 0600  ceFAZolin (ANCEF) IVPB 2g/100 mL premix  Status:  Discontinued     2 g 200 mL/hr over 30 Minutes Intravenous To Short Stay 12/31/19 1928 01/01/20 1259   01/01/20 0600  vancomycin (VANCOCIN) IVPB 1000 mg/200 mL premix     1,000 mg 200 mL/hr over 60 Minutes Intravenous To Short Stay 12/31/19 1928 01/01/20 1055      Subjective:  Patient seen and examined at the bedside this morning.  Hemodynamically stable.  Off oxygen.  Feels much better today.  Denies any abdominal pain.   Passing gas.  No bowel movement yet.  Wants to drink coffee.  Objective: Vitals:   01/08/20 2349 01/09/20 0000 01/09/20 0115 01/09/20 0400  BP: 116/89     Pulse: 86 76  96  Resp: 16     Temp: 98.1 F (36.7 C) 98.1 F (36.7 C)    TempSrc: Oral Oral    SpO2: 93% 94% 92%   Weight:      Height:        Intake/Output Summary (Last 24 hours) at 01/09/2020 0738 Last data filed at 01/09/2020 0600 Gross per 24 hour  Intake 2030.14 ml  Output 800 ml  Net 1230.14 ml   Filed Weights   12/31/19 2115 01/04/20 2047 01/05/20 2057  Weight: 71.7 kg 70.8 kg 74.8 kg    Examination:  General exam: Appears calm and comfortable ,Not in distress Respiratory system:  no wheezes or crackles  Cardiovascular system: Afib. No JVD, murmurs, rubs, gallops or clicks. Gastrointestinal system: Abdomen is nondistended, soft and appropriately tender.  Colostomy with empty bag, dressings.   Central nervous system: Alert and oriented. No focal neurological deficits. Extremities: No edema, no clubbing ,no cyanosis Skin: No rashes, lesions or ulcers,no icterus ,no pallor     Data Reviewed: I have personally reviewed following labs and imaging studies  CBC: Recent Labs  Lab 01/04/20 0509 01/06/20 0454 01/07/20 0202 01/08/20 0553 01/09/20 0531  WBC  --  14.2* 13.8* 16.1* 13.6*  NEUTROABS  --  12.3* 12.3* 13.5* PENDING  HGB 10.8* 10.3*  9.3* 9.2* 9.8*  HCT 33.6* 32.6* 29.3* 29.3* 31.8*  MCV  --  94.8 95.4 95.8 97.5  PLT  --  308 305 336 [id number]*   Basic Metabolic Panel: Recent Labs  Lab 01/03/20 0412 01/06/20 0454 01/07/20 0202 01/08/20 0553  NA 139 139 141 142  K 3.8 4.3 4.1 3.7  CL 102 102 107 110  CO2 27 27 22 23   GLUCOSE 108* 104* 89 109*  BUN 17 21 18 22   CREATININE 0.92 1.44* 1.06* 0.95  CALCIUM 8.8* 8.4* 8.1* 8.6*   GFR: Estimated Creatinine Clearance: 37.4 mL/min (by C-G formula based on SCr of 0.95 mg/dL). Liver Function Tests: Recent Labs  Lab 01/06/20 0454  AST 14*  ALT 13    ALKPHOS 64  BILITOT 1.6*  PROT 4.9*  ALBUMIN 2.1*   No results for input(s): LIPASE, AMYLASE in the last 168 hours. No results for input(s): AMMONIA in the last 168 hours. Coagulation Profile: Recent Labs  Lab 01/06/20 0454  INR 1.2   Cardiac Enzymes: No results for input(s): CKTOTAL, CKMB, CKMBINDEX, TROPONINI in the last 168 hours. BNP (last 3 results) No results for input(s): PROBNP in the last 8760 hours. HbA1C: No results for input(s): HGBA1C in the last 72 hours. CBG: Recent Labs  Lab 01/06/20 1535 01/06/20 1911 01/08/20 0624 01/08/20 2014  GLUCAP 87 88 96 121*   Lipid Profile: No results for input(s): CHOL, HDL, LDLCALC, TRIG, CHOLHDL, LDLDIRECT in the last 72 hours. Thyroid Function Tests: Recent Labs    01/07/20 1036 01/08/20 0553  TSH 0.187*  --   FREET4  --  1.69*   Anemia Panel: No results for input(s): VITAMINB12, FOLATE, FERRITIN, TIBC, IRON, RETICCTPCT in the last 72 hours. Sepsis Labs: Recent Labs  Lab 01/07/20 0202  LATICACIDVEN 0.8    Recent Results (from the past 240 hour(s))  Surgical pcr screen     Status: None   Collection Time: 12/31/19  7:49 PM   Specimen: Nasal Mucosa; Nasal Swab  Result Value Ref Range Status   MRSA, PCR NEGATIVE NEGATIVE Final   Staphylococcus aureus NEGATIVE NEGATIVE Final    Comment: (NOTE) The Xpert SA Assay (FDA approved for NASAL specimens in patients 36 years of age and older), is one component of a comprehensive surveillance program. It is not intended to diagnose infection nor to guide or monitor treatment. Performed at [facility], [place] [address], [place], [place] [address]          Radiology Studies: No results found.      Scheduled Meds: . amiodarone  200 mg Oral Daily  . bupivacaine liposome  20 mL Infiltration Once  . Chlorhexidine Gluconate Cloth  6 each Topical Daily  . metoprolol tartrate  2.5 mg Intravenous Q6H  . pantoprazole (PROTONIX) IV  40 mg Intravenous Daily  .  polyethylene glycol  17 g Oral Daily  . predniSONE  10 mg Oral Q breakfast  . saccharomyces boulardii  250 mg Oral BID  . sodium chloride flush  3 mL Intravenous Q12H   Continuous Infusions: . sodium chloride 75 mL/hr at 01/08/20 1534  . heparin 1,150 Units/hr (01/08/20 1537)  . piperacillin-tazobactam (ZOSYN)  IV 3.375 g (01/09/20 0229)     LOS: 11 days    Time spent: 35 mins.More than 50% of that time was spent in counseling and/or coordination of care.      Shelly Coss, MD [facility] P2/28/2021, 7:38 AM

## 2020-01-09 NOTE — Progress Notes (Signed)
Patient received OOB in chair assisted back to bed w/o distress, VSS, afebrile. Abdomen softly distended, dressing intact  as is colostomy with two piece appliance, no stool noted some flatus,. Patient tolerating clears without nausea, no vomiting. Patient given MSO4x1 for pain with good result. IV heparin continues at rate of 11.5 via peripheral access.

## 2020-01-09 NOTE — Progress Notes (Signed)
ANTICOAGULATION CONSULT NOTE   Pharmacy Consult for Heparin Indication: atrial fibrillation   Patient Measurements: Height: 5\' 6"  (167.6 cm) Weight: 164 lb 14.5 oz (74.8 kg) IBW/kg (Calculated) : 59.3 Heparin Dosing Weight: 74.8kg  Vital Signs: Temp: 98.2 F (36.8 C) (02/28 0800) Temp Source: Oral (02/28 0800) BP: 133/76 (02/28 0800) Pulse Rate: 94 (02/28 0800)  Labs: Recent Labs    01/07/20 0202 01/07/20 0202 01/08/20 0553 01/08/20 1414 01/09/20 0336 01/09/20 0531  HGB 9.3*   < > 9.2*  --   --  9.8*  HCT 29.3*  --  29.3*  --   --  31.8*  PLT 305  --  336  --   --  406*  HEPARINUNFRC  --   --  0.21* 0.43 0.34  --   CREATININE 1.06*  --  0.95  --   --   --    < > = values in this interval not displayed.    Estimated Creatinine Clearance: 37.4 mL/min (by C-G formula based on SCr of 0.95 mg/dL).   Medical History:  Assesment: 86 yo female with new onset afib (CHADS2VASc =4) who is s/p Ex lap and sigmoid colectomy to start on IV heparin with no boluses per Surgery service.  HL therapeutic, H/H, plt stable, no bleed reported.    Goal of Therapy:  Heparin level 0.3-0.7 units/ml Monitor platelets by anticoagulation protocol: Yes   Plan:  Continue heparin infusion at 1150 units/hr Monitor heparin level, CBC, for s/sx of bleeding   Benetta Spar, PharmD, BCPS, [facility] Clinical Pharmacist  Please check AMION for all [place] phone numbers After 10:00 PM, call [place] [phone]

## 2020-01-10 LAB — CBC WITH DIFFERENTIAL/PLATELET
Abs Immature Granulocytes: 1.45 10*3/uL — ABNORMAL HIGH (ref 0.00–0.07)
Basophils Absolute: 0.1 10*3/uL (ref 0.0–0.1)
Basophils Relative: 1 %
Eosinophils Absolute: 0.1 10*3/uL (ref 0.0–0.5)
Eosinophils Relative: 1 %
HCT: 29.7 % — ABNORMAL LOW (ref 36.0–46.0)
Hemoglobin: 9.4 g/dL — ABNORMAL LOW (ref 12.0–15.0)
Immature Granulocytes: 11 %
Lymphocytes Relative: 17 %
Lymphs Abs: 2.2 10*3/uL (ref 0.7–4.0)
MCH: 30.1 pg (ref 26.0–34.0)
MCHC: 31.6 g/dL (ref 30.0–36.0)
MCV: 95.2 fL (ref 80.0–100.0)
Monocytes Absolute: 1.2 10*3/uL — ABNORMAL HIGH (ref 0.1–1.0)
Monocytes Relative: 9 %
Neutro Abs: 8.1 10*3/uL — ABNORMAL HIGH (ref 1.7–7.7)
Neutrophils Relative %: 61 %
Platelets: 421 10*3/uL — ABNORMAL HIGH (ref 150–400)
RBC: 3.12 MIL/uL — ABNORMAL LOW (ref 3.87–5.11)
RDW: 14.9 % (ref 11.5–15.5)
WBC: 13.2 10*3/uL — ABNORMAL HIGH (ref 4.0–10.5)
nRBC: 0 % (ref 0.0–0.2)

## 2020-01-10 LAB — SURGICAL PATHOLOGY

## 2020-01-10 LAB — BASIC METABOLIC PANEL
Anion gap: 12 (ref 5–15)
BUN: 14 mg/dL (ref 8–23)
CO2: 25 mmol/L (ref 22–32)
Calcium: 8.5 mg/dL — ABNORMAL LOW (ref 8.9–10.3)
Chloride: 103 mmol/L (ref 98–111)
Creatinine, Ser: 0.72 mg/dL (ref 0.44–1.00)
GFR calc Af Amer: >60 mL/min (ref >60)
GFR calc non Af Amer: >60 mL/min (ref >60)
Glucose, Bld: 87 mg/dL (ref 70–99)
Potassium: 3.7 mmol/L (ref 3.5–5.1)
Sodium: 140 mmol/L (ref 135–145)

## 2020-01-10 LAB — T3, FREE: T3, Free: 1.1 pg/mL — ABNORMAL LOW (ref 2.0–4.4)

## 2020-01-10 LAB — HEPARIN LEVEL (UNFRACTIONATED): Heparin Unfractionated: 0.31 IU/mL (ref 0.30–0.70)

## 2020-01-10 MED ORDER — VITAMIN A 10000 UNITS PO CAPS
10000.0000 [IU] | ORAL_CAPSULE | Freq: Every day | ORAL | Status: DC
  Administered 2020-01-10 – 2020-01-14 (×5): 10000 [IU] via ORAL
  Filled 2020-01-10 (×5): qty 1

## 2020-01-10 MED ORDER — POTASSIUM CHLORIDE 20 MEQ/15ML (10%) PO SOLN
40.0000 meq | Freq: Once | ORAL | Status: AC
  Administered 2020-01-10: 40 meq via ORAL
  Filled 2020-01-10: qty 30

## 2020-01-10 MED ORDER — MORPHINE SULFATE (PF) 2 MG/ML IV SOLN: 1.0000 mg | Freq: Four times a day (QID) | INTRAVENOUS | Status: DC | PRN

## 2020-01-10 MED ORDER — ACETAMINOPHEN 500 MG PO TABS
1000.0000 mg | ORAL_TABLET | Freq: Four times a day (QID) | ORAL | Status: DC
  Administered 2020-01-10 – 2020-01-11 (×4): 1000 mg via ORAL
  Filled 2020-01-10 (×4): qty 2

## 2020-01-10 MED ORDER — CHLORHEXIDINE GLUCONATE CLOTH 2 % EX PADS
6.0000 | MEDICATED_PAD | Freq: Every day | CUTANEOUS | Status: DC
  Administered 2020-01-11: 6 via TOPICAL

## 2020-01-10 MED ORDER — METOPROLOL TARTRATE 25 MG PO TABS
25.0000 mg | ORAL_TABLET | Freq: Two times a day (BID) | ORAL | Status: DC
  Administered 2020-01-10 – 2020-01-11 (×2): 25 mg via ORAL
  Filled 2020-01-10 (×2): qty 1

## 2020-01-10 MED ORDER — OXYCODONE HCL 5 MG PO TABS
5.0000 mg | ORAL_TABLET | ORAL | Status: DC | PRN
  Administered 2020-01-10 – 2020-01-11 (×3): 5 mg via ORAL
  Filled 2020-01-10 (×3): qty 1

## 2020-01-10 NOTE — Progress Notes (Signed)
SATURATION QUALIFICATIONS: (This note is used to comply with regulatory documentation for home oxygen)  Patient Saturations on Room Air at Rest = 86%  Patient Saturations on Room Air while Ambulating = NT as desats on RA at rest  Patient Saturations on 6 Liters of oxygen while Ambulating = 88%  Please briefly explain why patient needs home oxygen:Pt requires O2 to maintain sats above 90% at rest and with activity.  Caty Tessler W,PT Acute Rehabilitation Services Pager:  [phone]  Office:  [phone]

## 2020-01-10 NOTE — Progress Notes (Signed)
PROGRESS NOTE    [name]  [id number] DOB: [birth year] DOA: 12/29/2019 PCP: [name], MD   Brief Narrative:  Patient is a 86 year old female with history of hypertension, hypothyroidism, polymyalgia rheumatica, CKD 3A who presented initially to [facility] for the evaluation of severe left wrist pain after a fall.  She was also having abdominal pain, diarrhea for last few days, lightheaded upon standing and fell multiple times.  When she presented, she was found to have AKI, distal left radius fracture.  She also had left lateral rib fractures.  Orthopedics was consulted and she underwent ORIF of left radial fracture.  Physical therapy recommended CIR but insurance denied.  We were planning for discharging to home with home health.  On 01/04/2019 1 AM, she developed severe lower abdominal pain.  CT abdomen/pelvis showed perforated sigmoid diverticulitis with pneumoperitoneum.  General surgery consulted.  After discussion with family, general surgery initially decided for conservative management. She had worsening leukocytosis, became hypotensive and developed AKI.  She underwent exploratory laparotomy, sigmoid colectomy with end colostomy by Dr. Redmond Pulling on 01/06/2020. Hospital course also remarkable for A. fib with RVR.  Cardiology  Consulted and following. Started on heparin drip. Currently she remains hemodynamically stable.  She started having bowel movements.  Discharge planning in 1 to 2 days.  She is interested on rehab.  Assessment & Plan:   Principal Problem:   Acute renal failure superimposed on stage 3a chronic kidney disease (HCC) Active Problems:   Hypothyroidism   HTN (hypertension)   Preoperative cardiovascular examination   Closed fracture of multiple ribs   Closed fracture of left distal radius   Recurrent falls   Perforated sigmoid diverticulitis/pneumoperitoneum: On 01/04/2019 1 AM, she developed severe lower abdominal pain.  CT abdomen/pelvis  showed perforated sigmoid diverticulitis with pneumoperitoneum.  General surgery consulted.  After discussion with family, general surgery initially decided for conservative management. She had worsening leukocytosis, became hypotensive and developed AKI.  She underwent exploratory laparotomy, sigmoid colectomy with end colostomy by Dr. Redmond Pulling on 01/06/2020 She has mild leucocytosis.Afebrile Started having bowel movements.  Antibiotics will be discontinued today.  Afib with RVR:  Paroxysmal A. fib precipitated most likely secondary to present medical/surgical illness. Currently on oral metoprolol. Cardiology consulted. Started on heparin drip. Given her age and comorbidities,starting  anticoagulation could be extremely unsafe for her.Started on amiodarone.Rate well controlled today.  I will wait for cardiology's  recommendation on anticoagulation.  AKI on CKD stage IIIa: Resolved.  Acute hypoxic respiratory failure: Most likely secondary to pain/atelectasis from rib fractures.  Continue incentive spirometry.  Chest x-rays did not show any acute infection but showed atelectasis of left lung base.  Currently on room air.  Distal left radius fracture: Secondary to fall.  X-ray showed oblique fracture of the distal radius at the proximal end of the plate and screws in place for fixation of remote healed fracture.  Currently in sling.  Status post ORIF by orthopedics.  Removal of left distal radius hardware deep implants.  Orthopedics recommended not weightbearing of left upper extremity .  Follow-up recommended in 2 weeks.  Continue pain management. PT/OT recommended had CIR but insurance denied.   Social worker has been consulted.  Lateral left rib fractures: Fracture of lateral eighth, ninth ribs on chest x-ray.  Continue pain management, incentive spirometry  Polymyalgia rheumatica: Continue home dose of prednisone  Hypertension: Blood pressure stable.  BP medications on hold.  Hypothyroidism:  Thyroid panel showed low TSH, high free  T4.  Low  free T3.  Discontinued Synthyroid, patient might be in hyperthyroid state at present.  Dizziness/fall: Suspected secondary to dehydration.  Echocardiogram showed ejection fraction A999333, grade 1 diastolic dysfunction.  Orthostatic vitals were negative echo also showed regional wall motion abnormalities, apical septal and anterior hypokinesis. No further work-up needed.  Normocytic anemia: Hemoglobin stable.  Continue to monitor.          DVT prophylaxis:Heparin  Code Status: Full Family Communication: Son on phone on 01/08/20 disposition Plan: Patient is from home.  Underwent surgery on 01/06/2020.  PT/OT recommended CIR. She has been denied CIR by her insurance.  No family and patient interested from skilled nursing facility.  Case manager consulted.She will be ready for discharge from medical perspective in 1 to 2 days  Consultants: Orthopedics, cardiology  Procedures: ORIF, exploratory laparotomy  Antimicrobials:  Anti-infectives (From admission, onward)   Start     Dose/Rate Route Frequency Ordered Stop   01/06/20 0230  piperacillin-tazobactam (ZOSYN) IVPB 3.375 g     3.375 g 12.5 mL/hr over 240 Minutes Intravenous Every 8 hours 01/05/20 1952     01/05/20 2000  piperacillin-tazobactam (ZOSYN) IVPB 3.375 g     3.375 g 100 mL/hr over 30 Minutes Intravenous  Once 01/05/20 1952 01/05/20 2049   01/01/20 2030  ceFAZolin (ANCEF) IVPB 1 g/50 mL premix  Status:  Discontinued     1 g 100 mL/hr over 30 Minutes Intravenous Every 8 hours 01/01/20 1307 01/03/20 1357   01/01/20 1315  ceFAZolin (ANCEF) IVPB 1 g/50 mL premix     1 g 100 mL/hr over 30 Minutes Intravenous NOW 01/01/20 1307 01/01/20 1454   01/01/20 0600  ceFAZolin (ANCEF) IVPB 2g/100 mL premix  Status:  Discontinued     2 g 200 mL/hr over 30 Minutes Intravenous To Short Stay 12/31/19 1928 01/01/20 1259   01/01/20 0600  vancomycin (VANCOCIN) IVPB 1000 mg/200 mL premix     1,000  mg 200 mL/hr over 60 Minutes Intravenous To Short Stay 12/31/19 1928 01/01/20 1055      Subjective:  Patient seen and examined at the bedside this morning.  Appears comfortable.  No complaints of abdomen pain.  Heart rate well controlled.  She has been having bowel movements.  Patient little concerned about discharge planning to home and wants to consider rehabilitation.  Objective: Vitals:   01/09/20 0800 01/09/20 1145 01/09/20 1600 01/09/20 2000  BP: 133/76 125/88 131/80 128/75  Pulse: 94 (!) 106 97 95  Resp: (!) 22 20 17    Temp: 98.2 F (36.8 C) 98.2 F (36.8 C) 98.4 F (36.9 C) 98.3 F (36.8 C)  TempSrc: Oral Oral Oral Oral  SpO2: 100% 91% 98%   Weight:      Height:        Intake/Output Summary (Last 24 hours) at 01/10/2020 0736 Last data filed at 01/10/2020 0500 Gross per 24 hour  Intake --  Output 1200 ml  Net -1200 ml   Filed Weights   12/31/19 2115 01/04/20 2047 01/05/20 2057  Weight: 71.7 kg 70.8 kg 74.8 kg    Examination: General exam: Pleasant elderly female. Respiratory system:  no wheezes or crackles  Cardiovascular system:Afib Gastrointestinal system: Abdomen is nondistended, soft and nontender. Normal bowel sounds heard.  Stool in the colostomy bag.  Surgical wound Central nervous system: Alert and oriented. No focal neurological deficits. Extremities: No edema, no clubbing ,no cyanosis Skin: No rashes, lesions or ulcers,no icterus ,no pallor  Data Reviewed: I have personally  reviewed following labs and imaging studies  CBC: Recent Labs  Lab 01/06/20 0454 01/07/20 0202 01/08/20 0553 01/09/20 0531 01/10/20 0221  WBC 14.2* 13.8* 16.1* 13.6* 13.2*  NEUTROABS 12.3* 12.3* 13.5* 9.2* 8.1*  HGB 10.3* 9.3* 9.2* 9.8* 9.4*  HCT 32.6* 29.3* 29.3* 31.8* 29.7*  MCV 94.8 95.4 95.8 97.5 95.2  PLT 308 305 336 406* [id number]*   Basic Metabolic Panel: Recent Labs  Lab 01/06/20 0454 01/07/20 0202 01/08/20 0553 01/10/20 0221  NA 139 141 142 140  K 4.3 4.1 3.7  3.7  CL 102 107 110 103  CO2 27 22 23 25   GLUCOSE 104* 89 109* 87  BUN 21 18 22 14   CREATININE 1.44* 1.06* 0.95 0.72  CALCIUM 8.4* 8.1* 8.6* 8.5*   GFR: Estimated Creatinine Clearance: 44.5 mL/min (by C-G formula based on SCr of 0.72 mg/dL). Liver Function Tests: Recent Labs  Lab 01/06/20 0454  AST 14*  ALT 13  ALKPHOS 64  BILITOT 1.6*  PROT 4.9*  ALBUMIN 2.1*   No results for input(s): LIPASE, AMYLASE in the last 168 hours. No results for input(s): AMMONIA in the last 168 hours. Coagulation Profile: Recent Labs  Lab 01/06/20 0454  INR 1.2   Cardiac Enzymes: No results for input(s): CKTOTAL, CKMB, CKMBINDEX, TROPONINI in the last 168 hours. BNP (last 3 results) No results for input(s): PROBNP in the last 8760 hours. HbA1C: No results for input(s): HGBA1C in the last 72 hours. CBG: Recent Labs  Lab 01/06/20 1535 01/06/20 1911 01/08/20 0624 01/08/20 2014  GLUCAP 87 88 96 121*   Lipid Profile: No results for input(s): CHOL, HDL, LDLCALC, TRIG, CHOLHDL, LDLDIRECT in the last 72 hours. Thyroid Function Tests: Recent Labs    01/07/20 1036 01/08/20 0553  TSH 0.187*  --   FREET4  --  1.69*   Anemia Panel: No results for input(s): VITAMINB12, FOLATE, FERRITIN, TIBC, IRON, RETICCTPCT in the last 72 hours. Sepsis Labs: Recent Labs  Lab 01/07/20 0202  LATICACIDVEN 0.8    Recent Results (from the past 240 hour(s))  Surgical pcr screen     Status: None   Collection Time: 12/31/19  7:49 PM   Specimen: Nasal Mucosa; Nasal Swab  Result Value Ref Range Status   MRSA, PCR NEGATIVE NEGATIVE Final   Staphylococcus aureus NEGATIVE NEGATIVE Final    Comment: (NOTE) The Xpert SA Assay (FDA approved for NASAL specimens in patients 60 years of age and older), is one component of a comprehensive surveillance program. It is not intended to diagnose infection nor to guide or monitor treatment. Performed at [facility], [place] [address] [place], [place] [address]    Culture, blood (routine x 2)     Status: None (Preliminary result)   Collection Time: 01/08/20  8:11 AM   Specimen: BLOOD LEFT HAND  Result Value Ref Range Status   Specimen Description BLOOD LEFT HAND  Final   Special Requests   Final    BOTTLES DRAWN AEROBIC ONLY Blood Culture results may not be optimal due to an inadequate volume of blood received in culture bottles   Culture   Final    NO GROWTH 2 DAYS Performed at [facility], [place] [address] [place], [place] [address]    Report Status PENDING  Incomplete  Culture, blood (routine x 2)     Status: None (Preliminary result)   Collection Time: 01/08/20  8:12 AM   Specimen: BLOOD LEFT FOREARM  Result Value Ref Range Status  Specimen Description BLOOD LEFT FOREARM  Final   Special Requests   Final    BOTTLES DRAWN AEROBIC ONLY Blood Culture results may not be optimal due to an inadequate volume of blood received in culture bottles   Culture   Final    NO GROWTH 2 DAYS Performed at [facility], [place] [address], [place], [place] [address]    Report Status PENDING  Incomplete         Radiology Studies: No results found.      Scheduled Meds: . amiodarone  200 mg Oral Daily  . bupivacaine liposome  20 mL Infiltration Once  . metoprolol tartrate  12.5 mg Oral BID  . pantoprazole (PROTONIX) IV  40 mg Intravenous Daily  . polyethylene glycol  17 g Oral Daily  . predniSONE  10 mg Oral Q breakfast  . saccharomyces boulardii  250 mg Oral BID  . sodium chloride flush  3 mL Intravenous Q12H   Continuous Infusions: . sodium chloride 75 mL/hr at 01/08/20 1534  . heparin 1,150 Units/hr (01/09/20 1620)  . piperacillin-tazobactam (ZOSYN)  IV 3.375 g (01/10/20 0330)     LOS: 12 days    Time spent: 35 mins.More than 50% of that time was spent in counseling and/or coordination of care.      Shelly Coss, MD [facility] P3/11/2019, 7:36 AM

## 2020-01-10 NOTE — Consult Note (Signed)
Explained role of ostomy nurse  Explained stoma characteristics (budded, flush, color, texture, care) Demonstrated pouch change (cutting new skin barrier, measuring stoma, cleaning peristomal skin and stoma, use of barrier ring) Education on emptying when 1/3 to 1/2 full and how to empty Demonstrated "burping" flatus from pouch Demonstrated use of wick to clean spout  Discussed bathing, diet, gas, medication use, constipation Discussed treatment of peristomal skin (ostomy powder, skin barrier wipes) Answered patient/family questions: The patient was unable to explain the process for changing her ostomy pouch and appeared very surprised when I explained the frequency for emptying the pouch and the care that will be required.  The patient stated that her arm is broken so she really "can't do anything with it."  Patient stated that she should be fine because she will have home health.  Patient was shocked when I told her that home health does not generally come to your home every day and stay all day.  When I asked her if she had additional assistance available patient stated her son will be helping her.  The patients son came in at the end of our teaching session and had a surprised look as well and did not say anything with regards to being able to assist his mother with her ostomy.  The patient was very receptive to learning about the process but because of her broken arm specifically the hard splint and wrap applied to the arm she would be incapable of even opening the pouch let alone changing it on her own.  I feel the patient would benefit greatly from a rehab or SNF to receive care until her arm has healed and she is able to change the pouching system on her own.  Dr. Tawanna Solo updated on our visit and he has contacted the case manager in order to see about SNF placement.  Austin Miles, RN, [employer]

## 2020-01-10 NOTE — Consult Note (Signed)
Ref: [name], MD   Subjective:  Feeling little better. Abdominal discomfort persist. Atrial fibrillation with controlled ventricular response. On IV heparin for now.  Objective:  Vital Signs in the last 24 hours: Temp:  [97.9 F (36.6 C)-98.3 F (36.8 C)] 97.9 F (36.6 C) (03/01 1724) Pulse Rate:  [85-95] 85 (03/01 1724) Cardiac Rhythm: Atrial fibrillation (03/01 0700) Resp:  [19-20] 20 (03/01 1724) BP: (128-131)/(75-83) 131/83 (03/01 1724) SpO2:  [96 %-98 %] 96 % (03/01 1724)  Physical Exam: BP Readings from Last 1 Encounters:  01/10/20 131/83     Wt Readings from Last 1 Encounters:  01/05/20 74.8 kg    Weight change:  Body mass index is 26.62 kg/m. HEENT: [place]/AT, Eyes-Blue, PERL, EOMI, Conjunctiva-Pale pink, Sclera-Non-icteric Neck: No JVD, No bruit, Trachea midline. Lungs:  Clearing, Bilateral. Cardiac:  Regular rhythm, normal S1 and S2, no S3. II/VI systolic murmur. Abdomen:  Soft, non-tender. BS present. Extremities:  No edema present. No cyanosis. No clubbing. Left forearm and hand cast persist. CNS: AxOx3, Cranial nerves grossly intact, moves all 4 extremities.  Skin: Warm and dry.   Intake/Output from previous day: 02/28 0701 - 03/01 0700 In: -  Out: 1200 [Urine:1200]    Lab Results: BMET    Component Value Date/Time   NA 140 01/10/2020 0221   NA 142 01/08/2020 0553   NA 141 01/07/2020 0202   K 3.7 01/10/2020 0221   K 3.7 01/08/2020 0553   K 4.1 01/07/2020 0202   CL 103 01/10/2020 0221   CL 110 01/08/2020 0553   CL 107 01/07/2020 0202   CO2 25 01/10/2020 0221   CO2 23 01/08/2020 0553   CO2 22 01/07/2020 0202   GLUCOSE 87 01/10/2020 0221   GLUCOSE 109 (H) 01/08/2020 0553   GLUCOSE 89 01/07/2020 0202   BUN 14 01/10/2020 0221   BUN 22 01/08/2020 0553   BUN 18 01/07/2020 0202   CREATININE 0.72 01/10/2020 0221   CREATININE 0.95 01/08/2020 0553   CREATININE 1.06 (H) 01/07/2020 0202   CREATININE 0.95 (H) 01/26/2016 1552   CALCIUM 8.5  (L) 01/10/2020 0221   CALCIUM 8.6 (L) 01/08/2020 0553   CALCIUM 8.1 (L) 01/07/2020 0202   GFRNONAA >60 01/10/2020 0221   GFRNONAA 51 (L) 01/08/2020 0553   GFRNONAA 45 (L) 01/07/2020 0202   GFRAA >60 01/10/2020 0221   GFRAA 59 (L) 01/08/2020 0553   GFRAA 52 (L) 01/07/2020 0202   CBC    Component Value Date/Time   WBC 13.2 (H) 01/10/2020 0221   RBC 3.12 (L) 01/10/2020 0221   HGB 9.4 (L) 01/10/2020 0221   HCT 29.7 (L) 01/10/2020 0221   PLT 421 (H) 01/10/2020 0221   MCV 95.2 01/10/2020 0221   MCH 30.1 01/10/2020 0221   MCHC 31.6 01/10/2020 0221   RDW 14.9 01/10/2020 0221   LYMPHSABS 2.2 01/10/2020 0221   MONOABS 1.2 (H) 01/10/2020 0221   EOSABS 0.1 01/10/2020 0221   BASOSABS 0.1 01/10/2020 0221   HEPATIC Function Panel Recent Labs    06/22/19 1112 01/06/20 0454  PROT 6.6 4.9*   HEMOGLOBIN A1C No components found for: HGA1C,  MPG CARDIAC ENZYMES Lab Results  Component Value Date   TROPONINI <0.03 03/04/2016   BNP No results for input(s): PROBNP in the last 8760 hours. TSH Recent Labs    06/22/19 1112 01/07/20 1036  TSH 1.42 0.187*   CHOLESTEROL Recent Labs    06/22/19 1112  CHOL 190    Scheduled Meds: . acetaminophen  1,000  mg Oral Q6H  . amiodarone  200 mg Oral Daily  . bupivacaine liposome  20 mL Infiltration Once  . Chlorhexidine Gluconate Cloth  6 each Topical Daily  . metoprolol tartrate  12.5 mg Oral BID  . pantoprazole (PROTONIX) IV  40 mg Intravenous Daily  . polyethylene glycol  17 g Oral Daily  . predniSONE  10 mg Oral Q breakfast  . saccharomyces boulardii  250 mg Oral BID  . sodium chloride flush  3 mL Intravenous Q12H  . vitamin A  10,000 Units Oral Daily   Continuous Infusions: . heparin 1,200 Units/hr (01/10/20 1800)  . piperacillin-tazobactam (ZOSYN)  IV 12.5 mL/hr at 01/10/20 1800   PRN Meds:.acetaminophen **OR** acetaminophen, diltiazem, morphine injection, ondansetron (ZOFRAN) IV, oxyCODONE  Assessment/Plan: Atrial  fibrillation with controlled ventricular response, CHA2DS2VASc score of 4 Acute perforated diverticulitis with peritonitis S/P Hartmann's procedure CKD IIIa, improved HTN Hypothyroidism Polymyalgia Rheumatica  Increase Metoprolol dose as tolerated for rate control.   LOS: 12 days   Time spent including chart review, lab review, examination, discussion with patient and nurse : 30 min   Dixie Dials  MD  01/10/2020, 6:45 PM

## 2020-01-10 NOTE — Progress Notes (Signed)
General Surgery Follow Up Note  Subjective:    Overnight Issues: [name], states she didn't eat well yesterday due to pain. Denies n/v. Ostomy productive.  Objective:  Vital signs for last 24 hours: Temp:  [98.1 F (36.7 C)-98.4 F (36.9 C)] 98.1 F (36.7 C) (03/01 0841) Pulse Rate:  [85-106] 85 (03/01 0841) Resp:  [17-20] 19 (03/01 0841) BP: (125-131)/(75-88) 131/76 (03/01 0841) SpO2:  [91 %-98 %] 98 % (03/01 0841)  Hemodynamic parameters for last 24 hours:    Intake/Output from previous day: 02/28 0701 - 03/01 0700 In: -  Out: 1200 [Urine:1200]  Intake/Output this shift: Total I/O In: 969.9 [I.V.:719.9; IV Piggyback:250] Out: -   Physical Exam:  Gen: comfortable, no distress Neuro: non-focal exam HEENT: PERRL Neck: supple CV: RRR Pulm: unlabored breathing on Barrett Abd: soft, NT, ostomy PPP, midline WtD with healthy granulation tissue at the base of the wound GU: clear yellow urine, foley Extr: wwp, no edema   Results for orders placed or performed during the hospital encounter of 12/29/19 (from the past 24 hour(s))  Heparin level (unfractionated)     Status: None   Collection Time: 01/10/20  2:21 AM  Result Value Ref Range   Heparin Unfractionated 0.31 0.30 - 0.70 IU/mL  CBC with Differential/Platelet     Status: Abnormal   Collection Time: 01/10/20  2:21 AM  Result Value Ref Range   WBC 13.2 (H) 4.0 - 10.5 K/uL   RBC 3.12 (L) 3.87 - 5.11 MIL/uL   Hemoglobin 9.4 (L) 12.0 - 15.0 g/dL   HCT 29.7 (L) 36.0 - 46.0 %   MCV 95.2 80.0 - 100.0 fL   MCH 30.1 26.0 - 34.0 pg   MCHC 31.6 30.0 - 36.0 g/dL   RDW 14.9 11.5 - 15.5 %   Platelets 421 (H) 150 - 400 K/uL   nRBC 0.0 0.0 - 0.2 %   Neutrophils Relative % 61 %   Neutro Abs 8.1 (H) 1.7 - 7.7 K/uL   Lymphocytes Relative 17 %   Lymphs Abs 2.2 0.7 - 4.0 K/uL   Monocytes Relative 9 %   Monocytes Absolute 1.2 (H) 0.1 - 1.0 K/uL   Eosinophils Relative 1 %   Eosinophils Absolute 0.1 0.0 - 0.5 K/uL   Basophils  Relative 1 %   Basophils Absolute 0.1 0.0 - 0.1 K/uL   WBC Morphology DOHLE BODIES    Immature Granulocytes 11 %   Abs Immature Granulocytes 1.45 (H) 0.00 - 0.07 K/uL   Polychromasia PRESENT   Basic metabolic panel     Status: Abnormal   Collection Time: 01/10/20  2:21 AM  Result Value Ref Range   Sodium 140 135 - 145 mmol/L   Potassium 3.7 3.5 - 5.1 mmol/L   Chloride 103 98 - 111 mmol/L   CO2 25 22 - 32 mmol/L   Glucose, Bld 87 70 - 99 mg/dL   BUN 14 8 - 23 mg/dL   Creatinine, Ser 0.72 0.44 - 1.00 mg/dL   Calcium 8.5 (L) 8.9 - 10.3 mg/dL   GFR calc non Af Amer >60 >60 mL/min   GFR calc Af Amer >60 >60 mL/min   Anion gap 12 5 - 15    Assessment & Plan: The plan of care was discussed with the bedside nurse for the day who is in agreement with this plan and no additional concerns were raised.   Present on Admission: . HTN (hypertension) . Hypothyroidism . Closed fracture of multiple ribs . Closed fracture  of left distal radius    LOS: 12 days   Additional comments:I reviewed the patient's new clinical lab test results.    HTN Hypothyroidism Hx DVT in 2016 GERD PVD Recent fall w/ left wrist fx s/pL wrist repair by Dr. Caralyn Guile on 2/20   Perforated sigmoid diverticulitis with purulent peritonitis - s/p ex lap,sigmoid colectomy with end colostomy by Dr. Redmond Pulling 01/06/2020. Monitor ostomy output. BID wet to dry dressing changes, WOC on board for ostomy teaching and dressing changes. Encourage ambulation, IS/pulm toilet. Pain regimen adjusted today. Wean O2 via [place] as tolerated. CKD w/ AKI- GFR and creatinine normalized New onset Atrial fibrillation - remains in fib on amiodarone (started 2/27) and heparin gtt, would not recommend transition to oral agent until just prior to discharge unless planning to discharge on coumadin. Given age, co-morbidities, and h/o falls, would recommend risk/benefit discussion with patient and family. ABL anemia - monitor while on heparin,  currently stable FEN - advance to soft diet Endo - h/o Polymyalgia rheumaticaon PO Prednisone 10mg  daily. Add vitamin A to mitigate steroid effects on wound healing. ID - recommend discontinuing zosyn after final dose today, 3/1. Will defer decision-making regarding this to the primary team. Would recommend Foley catheter removal.  DVT - SCDs, heparin gtt for afib, oral agent plan as above Follow-Up - Dr. Redmond Pulling Dispo - floor, anticipate being surgically ready for discharge mid-week. Recommend aggressive PT/OT/mobilization while in-house if she will be unable to go to [place], MD Trauma [facility] General Surgery Please use [email or web address] to contact on call provider  01/10/2020  *Care during the described time interval was provided by me. I have reviewed this patient's available data, including medical history, events of note, physical examination and test results as part of my evaluation.

## 2020-01-10 NOTE — TOC Progression Note (Signed)
Transition of Care [facility]) - Progression Note    Patient Details  Name: [name] MRN: [id number] Date of Birth: June 04, 1923  Transition of Care [facility]) CM/SW Contact  Pollie Friar, RN Phone Number: 01/10/2020, 2:40 PM  Clinical Narrative:    Patient and son requesting pt be looked at for CIR again since her new health issues and surgery. CM has updated Caitlin with CIR to please reevaluate for CIR.  If CIR again is denied patient will d/c home with [facility] services. Son made aware of [place] choices. Pt will also require DME for home.  TOC following.   Expected Discharge Plan: [place] Barriers to Discharge: Continued Medical Work up  Expected Discharge Plan and Services Expected Discharge Plan: [place] In-house Referral: Clinical Social Work Discharge Planning Services: CM Consult   Living arrangements for the past 2 months: Single Family Home                                       Social Determinants of Health (SDOH) Interventions    Readmission Risk Interventions No flowsheet data found.

## 2020-01-10 NOTE — Progress Notes (Signed)
ANTICOAGULATION CONSULT NOTE   Pharmacy Consult for Heparin Indication: atrial fibrillation   Patient Measurements: Height: 5\' 6"  (167.6 cm) Weight: 164 lb 14.5 oz (74.8 kg) IBW/kg (Calculated) : 59.3 Heparin Dosing Weight: 74.8kg  Vital Signs: Temp: 98.1 F (36.7 C) (03/01 0841) Temp Source: Oral (03/01 0841) BP: 131/76 (03/01 0841) Pulse Rate: 85 (03/01 0841)  Labs: Recent Labs    01/08/20 0553 01/08/20 0553 01/08/20 1414 01/09/20 0336 01/09/20 0531 01/10/20 0221  HGB 9.2*   < >  --   --  9.8* 9.4*  HCT 29.3*  --   --   --  31.8* 29.7*  PLT 336  --   --   --  406* 421*  HEPARINUNFRC 0.21*   < > 0.43 0.34  --  0.31  CREATININE 0.95  --   --   --   --  0.72   < > = values in this interval not displayed.    Estimated Creatinine Clearance: 44.5 mL/min (by C-G formula based on SCr of 0.72 mg/dL).   Medical History:  Assesment: 86 yo female with new onset afib (CHADS2VASc =4) who is s/p Ex lap and sigmoid colectomy to start on IV heparin with no boluses per Surgery service.  HL remains low therapeutic. SCr normalized. H/H low stable, Plt trending up. No s/s of bleeding per RN    Goal of Therapy:  Heparin level 0.3-0.7 units/ml Monitor platelets by anticoagulation protocol: Yes   Plan:  Increase heparin infusion slightly to 1200 units/hr  Monitor heparin level, CBC, for s/sx of bleeding   Albertina Parr, PharmD., BCPS Clinical Pharmacist Clinical phone for 01/10/20 until 5pm: [phone]

## 2020-01-10 NOTE — Progress Notes (Addendum)
Physical Therapy Treatment Patient Details Name: [name] MRN: [id number] DOB: 09/29/1923 Today's Date: 01/10/2020    History of Present Illness 86 yo female presenting to [facility] for severe left wrist pain after fall.  Pt reports that she had been experiencing upset stomach and diarrhea for a few days, had been lightheaded upon standing, had a fall without any significant appreciable injury yesterday, and then fell again this morning onto her left arm. Xrays show radial fx and left lateral 8th and 9th rib fxs (possibly the 7th as well). Pt underwent colostomy on 2/25.  PMH including is a HTN, hypothyroidism, polymyalgia rheumatica, and chronic kidney disease III.    PT Comments    Pt admitted with above diagnosis. Pt was able to ambulate with left platform RW with min asssist to min guard assist and incr distance.  Pt did need O2 to maintain sats >90% on RA at rest and with activity.  Son and pt and PT discussed home recommendations for f/u and equipment. They are arranging 24 hour care.  Will neeed HHPT, [place] and [facility].  Pt will need equipment as below as well.  Will rent hospital bed and the wheelchair short term and pt still is unsure if they will really want the wheelchair.  Called Rehab to ask them to reconsult and they initially were not sure if pt was medically complex enough however the admissions coordinator called and said that they would try to resubmit since pt now has colostomy and has great family support as the family does not want SNF.  Pt also cannot care for colostomy on her own as her left UE is broken and she will need education with caregivers.  Hopeful that Rehab can take pt as she has wonderful support when she is discharged.  Will continue to progress pt. Pt currently with functional limitations due to balance and endurance deficits. Pt will benefit from skilled PT to increase their independence and safety with mobility to allow discharge to the venue listed below.     Follow  Up Recommendations  CIR recommended.  If they deny will need Home health PT(HHOT, [facility])     Equipment Recommendations  Other (comment);Rolling walker with 5" wheels;3in1 (PT);Hospital bed;Wheelchair Sports administrator wheelchair, desk armrests, anti tippers, elevating legrests);Wheelchair cushion (pressure relieving cushion)(left platform for walker, home O2)    Recommendations for Other Services       Precautions / Restrictions Precautions Precautions: Fall Precaution Comments: Sling for comfort. Benefits from wearing it during mobility Restrictions Weight Bearing Restrictions: Yes LUE Weight Bearing: Non weight bearing    Mobility  Bed Mobility Overal bed mobility: Needs Assistance Bed Mobility: Rolling;Sidelying to Sit Rolling: Min assist Sidelying to sit: Min assist;Mod assist       General bed mobility comments: Pt needed cues for technique and assist for upper body.   Transfers Overall transfer level: Needs assistance Equipment used: Left platform walker Transfers: Sit to/from Stand Sit to Stand: Min assist;+2 physical assistance;From elevated surface;Mod assist         General transfer comment: min assist to power up on RUE.  Min to mod to steady and to get the left UE positioned in platform.  Ambulation/Gait Ambulation/Gait assistance: Min assist;+2 safety/equipment;Min guard Gait Distance (Feet): 145 Feet Assistive device: Left platform walker Gait Pattern/deviations: Step-through pattern;Step-to pattern;Drifts right/left;Decreased step length - left;Decreased dorsiflexion - left;Narrow base of support;Trunk flexed Gait velocity: decreased Gait velocity interpretation: <1.31 ft/sec, indicative of household ambulator General Gait Details: Removed O2 but desat  to 86% therefore replaced 4LO2 and needed 6L actually for walking.  Pt needed cues for technique as she was taking small steps and walking more to the left with RW going to right needing cues and  steadying assist to steer RW correctly as well as to take appropriate steps as pt tends to toe out on left foot and states she had been working on this with therapy prior to admit.   Pt progressed distance.    Stairs             Wheelchair Mobility    Modified Rankin (Stroke Patients Only)       Balance Overall balance assessment: Needs assistance Sitting-balance support: Feet supported;Single extremity supported Sitting balance-Leahy Scale: Fair Sitting balance - Comments: Pt sitting sideways due to abdominal pain initially but was able to get her comfortable prior to standing.    Standing balance support: During functional activity;Bilateral upper extremity supported Standing balance-Leahy Scale: Poor Standing balance comment: requires assist with or without AD                            Cognition Arousal/Alertness: Awake/alert Behavior During Therapy: WFL for tasks assessed/performed Overall Cognitive Status: Within Functional Limits for tasks assessed                                 General Comments: Required increased encouragement to participate      Exercises General Exercises - Lower Extremity Ankle Circles/Pumps: AROM;Both;10 reps;Supine Long Arc Quad: AROM;Both;5 reps;Seated    General Comments        Pertinent Vitals/Pain Pain Assessment: Faces Faces Pain Scale: Hurts whole lot Pain Location: Complaining of abdominal pain Pain Descriptors / Indicators: Grimacing;Discomfort;Guarding;Aching Pain Intervention(s): Monitored during session;Limited activity within patient's tolerance;Repositioned    Home Living Family/patient expects to be discharged to:: Private residence Living Arrangements: Alone Available Help at Discharge: Family;Available 24 hours/day Type of Home: House Home Access: Stairs to enter Entrance Stairs-Rails: None Home Layout: One level Home Equipment: Environmental consultant - 2 wheels;Grab bars - tub/shower Additional  Comments: Son and daughter who lives nextdoor    Prior Function Level of Independence: Independent      Comments: ADLs, IADLs, driving. Enjoys reading and goes for walks. Was going to OP PT for LLE injury (pulled hip tendon)   PT Goals (current goals can now be found in the care plan section) Acute Rehab PT Goals Patient Stated Goal: Return to home and be independent again Progress towards PT goals: Progressing toward goals    Frequency    Min 5X/week      PT Plan      Co-evaluation              AM-PAC PT "6 Clicks" Mobility   Outcome Measure  Help needed turning from your back to your side while in a flat bed without using bedrails?: A Little Help needed moving from lying on your back to sitting on the side of a flat bed without using bedrails?: A Little Help needed moving to and from a bed to a chair (including a wheelchair)?: A Little Help needed standing up from a chair using your arms (e.g., wheelchair or bedside chair)?: A Lot Help needed to walk in hospital room?: A Little Help needed climbing 3-5 steps with a railing? : A Lot 6 Click Score: 16    End of Session Equipment Utilized  During Treatment: Gait belt;Oxygen(4-6L with activity) Activity Tolerance: Patient limited by fatigue;Patient limited by pain Patient left: in chair;with call bell/phone within reach;with chair alarm set;with family/visitor present Nurse Communication: Mobility status PT Visit Diagnosis: Unsteadiness on feet (R26.81);Other abnormalities of gait and mobility (R26.89);Muscle weakness (generalized) (M62.81);Difficulty in walking, not elsewhere classified (R26.2);Repeated falls (R29.6);Pain Pain - Right/Left: Left Pain - part of body: Arm(Ribs)     Time: [id number] PT Time Calculation (min) (ACUTE ONLY): 33 min  Charges:  $Gait Training: 23-37 mins                     Courtni Balash W,PT [place] Pager:  [phone]  Office:  [place] 01/10/2020, 3:23 PM

## 2020-01-10 NOTE — Progress Notes (Signed)
Rehab Admissions Coordinator Note:  Per PMR MD recommendation, pt could potentially be a candidate for CIR.  I met with pt and her son at the bedside to discuss CIR expectations. I let them know that 24/7 supervision for mobility would need to be available, in case needed at d/c from CIR, and that pt would need someone available during the day to manage her colostomy bag.  Home health would not be available to do this on a regular basis, it would need to be a family member or hired caregiver.  Pt's son only says, "well we'll just have to see what we can do."  Pt states she has been provided with a list of agencies for care givers and that she knows that she will need someone to help her.  Neither were firm on the plan for who would ultimately be helping her, just reiterated that they knew someone would have to.  I will reopen her insurance for authorization with updated PT/OT notes tomorrow.    Michel Santee, PT, DPT 01/10/2020, 4:42 PM  I can be reached at [phone].

## 2020-01-11 ENCOUNTER — Inpatient Hospital Stay (HOSPITAL_COMMUNITY): Payer: Medicare PPO

## 2020-01-11 LAB — CBC
HCT: 34.5 % — ABNORMAL LOW (ref 36.0–46.0)
Hemoglobin: 10.9 g/dL — ABNORMAL LOW (ref 12.0–15.0)
MCH: 30 pg (ref 26.0–34.0)
MCHC: 31.6 g/dL (ref 30.0–36.0)
MCV: 95 fL (ref 80.0–100.0)
Platelets: 556 10*3/uL — ABNORMAL HIGH (ref 150–400)
RBC: 3.63 MIL/uL — ABNORMAL LOW (ref 3.87–5.11)
RDW: 14.9 % (ref 11.5–15.5)
WBC: 20.3 10*3/uL — ABNORMAL HIGH (ref 4.0–10.5)
nRBC: 0.1 % (ref 0.0–0.2)

## 2020-01-11 LAB — HEPARIN LEVEL (UNFRACTIONATED): Heparin Unfractionated: 0.57 IU/mL (ref 0.30–0.70)

## 2020-01-11 MED ORDER — BOOST / RESOURCE BREEZE PO LIQD CUSTOM
1.0000 | Freq: Two times a day (BID) | ORAL | Status: DC
  Administered 2020-01-11 – 2020-01-13 (×4): 1 via ORAL

## 2020-01-11 MED ORDER — HEPARIN (PORCINE) 25000 UT/250ML-% IV SOLN
1150.0000 [IU]/h | INTRAVENOUS | Status: DC
  Administered 2020-01-12: 1150 [IU]/h via INTRAVENOUS
  Filled 2020-01-11: qty 250

## 2020-01-11 MED ORDER — MORPHINE SULFATE (PF) 2 MG/ML IV SOLN
2.0000 mg | Freq: Four times a day (QID) | INTRAVENOUS | Status: DC | PRN
  Administered 2020-01-11: 2 mg via INTRAVENOUS
  Filled 2020-01-11: qty 1

## 2020-01-11 MED ORDER — APIXABAN 5 MG PO TABS: 5.0000 mg | ORAL_TABLET | Freq: Two times a day (BID) | ORAL | Status: DC

## 2020-01-11 MED ORDER — IOHEXOL 300 MG/ML  SOLN
100.0000 mL | Freq: Once | INTRAMUSCULAR | Status: AC | PRN
  Administered 2020-01-11: 100 mL via INTRAVENOUS

## 2020-01-11 MED ORDER — METOPROLOL TARTRATE 5 MG/5ML IV SOLN
5.0000 mg | Freq: Four times a day (QID) | INTRAVENOUS | Status: DC
  Administered 2020-01-11 – 2020-01-13 (×8): 5 mg via INTRAVENOUS
  Filled 2020-01-11 (×8): qty 5

## 2020-01-11 MED ORDER — PIPERACILLIN-TAZOBACTAM 3.375 G IVPB
3.3750 g | Freq: Three times a day (TID) | INTRAVENOUS | Status: DC
  Administered 2020-01-11 – 2020-01-12 (×2): 3.375 g via INTRAVENOUS
  Filled 2020-01-11 (×2): qty 50

## 2020-01-11 MED ORDER — ACETAMINOPHEN 500 MG PO TABS
500.0000 mg | ORAL_TABLET | Freq: Four times a day (QID) | ORAL | Status: DC
  Administered 2020-01-11 – 2020-01-14 (×7): 500 mg via ORAL
  Filled 2020-01-11 (×8): qty 1

## 2020-01-11 MED ORDER — PROMETHAZINE HCL 25 MG/ML IJ SOLN
12.5000 mg | Freq: Four times a day (QID) | INTRAMUSCULAR | Status: DC | PRN
  Administered 2020-01-11: 12.5 mg via INTRAVENOUS
  Filled 2020-01-11: qty 1

## 2020-01-11 MED ORDER — APIXABAN 5 MG PO TABS
5.0000 mg | ORAL_TABLET | Freq: Once | ORAL | Status: AC
  Administered 2020-01-11: 5 mg via ORAL
  Filled 2020-01-11: qty 1

## 2020-01-11 NOTE — Progress Notes (Signed)
[facility] Progress Note  5 Days Post-Op  Subjective: CC-  Complaining of some nausea this morning, no emesis. This is new from yesterday. Colostomy functioning, states that her nurse just emptied it and there is already a little more stool in the pouch.   Objective: Vital signs in last 24 hours: Temp:  [97.2 F (36.2 C)-98.2 F (36.8 C)] 97.2 F (36.2 C) (03/02 0158) Pulse Rate:  [71-85] 71 (03/02 0158) Resp:  [19-20] 20 (03/01 1724) BP: (115-140)/(68-83) 140/70 (03/02 0158) SpO2:  [95 %-98 %] 97 % (03/02 0158) Last BM Date: 01/09/20  Intake/Output from previous day: 03/01 0701 - 03/02 0700 In: 1324.9 [P.O.:60; I.V.:964.6; IV Piggyback:300.3] Out: 2200 [Urine:1500; Stool:700] Intake/Output this shift: No intake/output data recorded.  PE: Gen:  Alert, NAD, pleasant HEENT: EOM's intact, pupils equal and round Card:  RRR Pulm:  CTAB, no W/R/R, rate and effort normal Abd: Soft, NT/ND, +BS, ostomy viable with stool and air in pouch Psych: A&Ox3 Skin: no rashes noted, warm and dry  Lab Results:  Recent Labs    01/09/20 0531 01/10/20 0221  WBC 13.6* 13.2*  HGB 9.8* 9.4*  HCT 31.8* 29.7*  PLT 406* 421*   BMET Recent Labs    01/10/20 0221  NA 140  K 3.7  CL 103  CO2 25  GLUCOSE 87  BUN 14  CREATININE 0.72  CALCIUM 8.5*   PT/INR No results for input(s): LABPROT, INR in the last 72 hours. CMP     Component Value Date/Time   NA 140 01/10/2020 0221   K 3.7 01/10/2020 0221   CL 103 01/10/2020 0221   CO2 25 01/10/2020 0221   GLUCOSE 87 01/10/2020 0221   BUN 14 01/10/2020 0221   CREATININE 0.72 01/10/2020 0221   CREATININE 0.95 (H) 01/26/2016 1552   CALCIUM 8.5 (L) 01/10/2020 0221   PROT 4.9 (L) 01/06/2020 0454   ALBUMIN 2.1 (L) 01/06/2020 0454   AST 14 (L) 01/06/2020 0454   ALT 13 01/06/2020 0454   ALKPHOS 64 01/06/2020 0454   BILITOT 1.6 (H) 01/06/2020 0454   GFRNONAA >60 01/10/2020 0221   GFRAA >60 01/10/2020 0221   Lipase      Component Value Date/Time   LIPASE 25.0 03/06/2016 1123       Studies/Results: No results found.  Anti-infectives: Anti-infectives (From admission, onward)   Start     Dose/Rate Route Frequency Ordered Stop   01/06/20 0230  piperacillin-tazobactam (ZOSYN) IVPB 3.375 g     3.375 g 12.5 mL/hr over 240 Minutes Intravenous Every 8 hours 01/05/20 1952 01/10/20 2158   01/05/20 2000  piperacillin-tazobactam (ZOSYN) IVPB 3.375 g     3.375 g 100 mL/hr over 30 Minutes Intravenous  Once 01/05/20 1952 01/05/20 2049   01/01/20 2030  ceFAZolin (ANCEF) IVPB 1 g/50 mL premix  Status:  Discontinued     1 g 100 mL/hr over 30 Minutes Intravenous Every 8 hours 01/01/20 1307 01/03/20 1357   01/01/20 1315  ceFAZolin (ANCEF) IVPB 1 g/50 mL premix     1 g 100 mL/hr over 30 Minutes Intravenous NOW 01/01/20 1307 01/01/20 1454   01/01/20 0600  ceFAZolin (ANCEF) IVPB 2g/100 mL premix  Status:  Discontinued     2 g 200 mL/hr over 30 Minutes Intravenous To Short Stay 12/31/19 1928 01/01/20 1259   01/01/20 0600  vancomycin (VANCOCIN) IVPB 1000 mg/200 mL premix     1,000 mg 200 mL/hr over 60 Minutes Intravenous To Short Stay 12/31/19 1928 01/01/20 1055  Assessment/Plan HTN Hypothyroidism Hx DVT in 2016 GERD PVD Recent fall w/ left wrist fx s/pL wrist repair by Dr. Caralyn Guile on 2/20   Perforated sigmoid diverticulitis with purulent peritonitis  - s/p ex lap,sigmoid colectomy with end colostomy by Dr. Redmond Pulling 01/06/2020 - POD#5 - Monitor ostomy output.  - BID wet to dry dressing changes, WOC on board for ostomy teaching and dressing changes CKD w/ AKI- GFR and creatinine had normalized, labs pending this morning New onset Atrial fibrillation - cardiology following, metoprolol increased yesterday. On heparin gtt, would not recommend transition to oral agent until just prior to discharge unless planning to discharge on coumadin. Given age, co-morbidities, and h/o falls, would recommend  risk/benefit discussion with patient and family. ABL anemia - monitor while on heparin, labs pending this AM FEN - soft diet Endo - h/o Polymyalgia rheumaticaon PO Prednisone 10mg  daily. Continue vitamin A to mitigate steroid effects on wound healing. ID - zosyn 2/24>>3/1 Foley - ok to d/c foley from surgical standpoint DVT - SCDs, heparin gtt for afib, oral agent plan as above Follow-Up - Dr. Redmond Pulling  Dispo - Labs pending. Continue therapies, mobilize. Unable to go to CIR, working towards home with home health. Some nausea this morning but abdominal exam is benign, she has good bowel sounds and having bowel function; monitor closely as she is high risk for abscess development.   LOS: 13 days    Wellington Hampshire, [facility] 01/11/2020, 8:21 AM Please see Amion for pager number during day hours 7:00am-4:30pm

## 2020-01-11 NOTE — Progress Notes (Signed)
[facility] for Heparin >> Eliquis Indication: atrial fibrillation   Patient Measurements: Height: 5\' 6"  (167.6 cm) Weight: 164 lb 14.5 oz (74.8 kg) IBW/kg (Calculated) : 59.3  Vital Signs: Temp: 98 F (36.7 C) (03/02 0832) Temp Source: Oral (03/02 0158) BP: 129/71 (03/02 0832) Pulse Rate: 81 (03/02 0832)  Labs: Recent Labs    01/09/20 0336 01/09/20 0531 01/09/20 0531 01/10/20 0221 01/11/20 0416 01/11/20 1139  HGB  --  9.8*   < > 9.4*  --  10.9*  HCT  --  31.8*  --  29.7*  --  34.5*  PLT  --  406*  --  421*  --  556*  HEPARINUNFRC 0.34  --   --  0.31 0.57  --   CREATININE  --   --   --  0.72  --   --    < > = values in this interval not displayed.    Estimated Creatinine Clearance: 44.5 mL/min (by C-G formula based on SCr of 0.72 mg/dL).   Assesment:  86 yo female with new onset Afib (CHADS2VASc = 4) to transition from IV heparin to Eliquis.  Patient qualifies for full dosing (age > 46 but weight > 60kg and SCr < 1.5).  No bleeding reported.   Goal of Therapy:  Heparin level 0.3-0.7 units/ml Monitor platelets by anticoagulation protocol: Yes   Plan:  Stop IV heparin when Eliquis is administered Eliquis 5mg  PO BID Pharmacy will sign off and follow peripherally.  Thank you for the consult!  Timmy Cleverly D. Mina Marble, PharmD, BCPS, [place] 01/11/2020, 12:28 PM

## 2020-01-11 NOTE — Plan of Care (Signed)
  Problem: Education: Goal: Knowledge of disease and its progression will improve Outcome: Progressing   Problem: Activity: Goal: Activity intolerance will improve Outcome: Progressing   Problem: Nutritional: Goal: Ability to make appropriate dietary choices will improve Outcome: Not Progressing  Pt intake poor today due to nausea. Anti-emetics given frequently throughout shift. Up to chair with PT today. OT exercises while in chair

## 2020-01-11 NOTE — Progress Notes (Signed)
PROGRESS NOTE    [name]  [id number] DOB: 1923-03-23 DOA: 12/29/2019 PCP: [name], MD   Brief Narrative:  Patient is a 86 year old female with history of hypertension, hypothyroidism, polymyalgia rheumatica, CKD 3a who presented initially to [facility] for the evaluation of severe left wrist pain after a fall.  She was also having abdominal pain, diarrhea for last few days, lightheaded upon standing and fell multiple times.  When she presented, she was found to have AKI, distal left radius fracture.  She also had left lateral rib fractures.  Orthopedics was consulted and she underwent ORIF of left radial fracture.  Physical therapy recommended CIR but insurance denied.  We were planning for discharging to home with home health.  On 01/04/2019 1 AM, she developed severe lower abdominal pain.  CT abdomen/pelvis showed perforated sigmoid diverticulitis with pneumoperitoneum.  General surgery consulted.  After discussion with family, general surgery initially decided for conservative management. She had worsening leukocytosis, became hypotensive and developed AKI.  She underwent exploratory laparotomy, sigmoid colectomy with end colostomy by Dr. Redmond Pulling on 01/06/2020. Hospital course also remarkable for A. fib with RVR.  Cardiology  Consulted and following. Started on heparin drip which has been changed to eliquis. Currently she remains hemodynamically stable.  She started having bowel movements.  Discharge planning in 1 to 2 days. PT recommending CIR.  Assessment & Plan:   Principal Problem:   Acute renal failure superimposed on stage 3a chronic kidney disease (HCC) Active Problems:   Hypothyroidism   HTN (hypertension)   Preoperative cardiovascular examination   Closed fracture of multiple ribs   Closed fracture of left distal radius   Recurrent falls   Perforated sigmoid diverticulitis/pneumoperitoneum: On 01/04/2019 1 AM, she developed severe lower abdominal  pain.  CT abdomen/pelvis showed perforated sigmoid diverticulitis with pneumoperitoneum.  General surgery consulted.  After discussion with family, general surgery initially decided for conservative management. She had worsening leukocytosis, became hypotensive and developed AKI.  She underwent exploratory laparotomy, sigmoid colectomy with end colostomy by Dr. Redmond Pulling on 01/06/2020 She has mild leucocytosis.Afebrile Started having bowel movements.  Antibiotics discontinued . Complains of nausea this morning.  Afib with RVR:  Paroxysmal A. fib precipitated most likely secondary to present medical/surgical illness. Currently on oral metoprolol. Cardiology consulted. Started on heparin drip. Rate well controlled today.  We will start on eliquis.  AKI on CKD stage IIIa: Resolved.  Acute hypoxic respiratory failure: Most likely secondary to pain/atelectasis from rib fractures.  Continue incentive spirometry.  Chest x-rays did not show any acute infection but showed atelectasis of left lung base.  Currently on 2 L.  She  might need oxygen on discharge.  Distal left radius fracture: Secondary to fall.  X-ray showed oblique fracture of the distal radius at the proximal end of the plate and screws in place for fixation of remote healed fracture.  Currently in sling.  Status post ORIF by orthopedics.  Removal of left distal radius hardware deep implants.  Orthopedics recommended not weightbearing of left upper extremity .  Follow-up recommended in 2 weeks.  Continue pain management. PT/OT recommended had CIR .  Rehab staff following.  Lateral left rib fractures: Fracture of lateral eighth, ninth ribs on chest x-ray.  Continue pain management, incentive spirometry  Polymyalgia rheumatica: Continue home dose of prednisone  Hypertension: Blood pressure stable currently .Home medications on hold  Hypothyroidism: Thyroid panel showed low TSH, high free T4.  Low  free T3.  Discontinued Synthyroid, patient  might  be in hyperthyroid state at present.  Dizziness/fall: Suspected secondary to dehydration.  Echocardiogram showed ejection fraction [id number], grade 1 diastolic dysfunction.  Orthostatic vitals were negative echo also showed regional wall motion abnormalities, apical septal and anterior hypokinesis. No further work-up needed.  Normocytic anemia: Hemoglobin stable.  Continue to monitor.          DVT prophylaxis: eliquis Code Status: Full Family Communication: Son on phone on 01/11/20 disposition Plan: Patient is from home.   PT/OT recommended CIR. not a stable for discharge today because she is not cleared from surgery and she had nausea this morning  Consultants: Orthopedics, cardiology  Procedures: ORIF, exploratory laparotomy  Antimicrobials:  Anti-infectives (From admission, onward)   Start     Dose/Rate Route Frequency Ordered Stop   01/06/20 0230  piperacillin-tazobactam (ZOSYN) IVPB 3.375 g     3.375 g 12.5 mL/hr over 240 Minutes Intravenous Every 8 hours 01/05/20 1952 01/10/20 2158   01/05/20 2000  piperacillin-tazobactam (ZOSYN) IVPB 3.375 g     3.375 g 100 mL/hr over 30 Minutes Intravenous  Once 01/05/20 1952 01/05/20 2049   01/01/20 2030  ceFAZolin (ANCEF) IVPB 1 g/50 mL premix  Status:  Discontinued     1 g 100 mL/hr over 30 Minutes Intravenous Every 8 hours 01/01/20 1307 01/03/20 1357   01/01/20 1315  ceFAZolin (ANCEF) IVPB 1 g/50 mL premix     1 g 100 mL/hr over 30 Minutes Intravenous NOW 01/01/20 1307 01/01/20 1454   01/01/20 0600  ceFAZolin (ANCEF) IVPB 2g/100 mL premix  Status:  Discontinued     2 g 200 mL/hr over 30 Minutes Intravenous To Short Stay 12/31/19 1928 01/01/20 1259   01/01/20 0600  vancomycin (VANCOCIN) IVPB 1000 mg/200 mL premix     1,000 mg 200 mL/hr over 60 Minutes Intravenous To Short Stay 12/31/19 1928 01/01/20 1055      Subjective:  Patient seen and examined the bedside this morning.  Hemodynamically stable.  Heart rate well controlled today.   Still complains of severe nausea this morning.  Could not eat her breakfast.  She is having bowel movement.  Objective: Vitals:   01/10/20 1724 01/10/20 2023 01/11/20 0158 01/11/20 0832  BP: 131/83 115/68 140/70 129/71  Pulse: 85 80 71 81  Resp: 20   20  Temp: 97.9 F (36.6 C) 98.2 F (36.8 C) (!) 97.2 F (36.2 C) 98 F (36.7 C)  TempSrc:  Oral Oral   SpO2: 96% 95% 97% 99%  Weight:      Height:        Intake/Output Summary (Last 24 hours) at 01/11/2020 1115 Last data filed at 01/11/2020 0912 Gross per 24 hour  Intake 535.06 ml  Output 2200 ml  Net -1664.94 ml   Filed Weights   12/31/19 2115 01/04/20 2047 01/05/20 2057  Weight: 71.7 kg 70.8 kg 74.8 kg    Examination:   General exam: Pleasant elderly female, generalized weakness Respiratory system: Diminished air sounds on the bases, no wheezes or crackles  Cardiovascular system: Afib, No JVD, murmurs, rubs, gallops or clicks. Gastrointestinal system: Abdomen is nondistended, soft , appropriately tender.  Colostomy with stool.  Surgical wounds covered with dressing .  Bowel sounds present Central nervous system: Alert and oriented. No focal neurological deficits. Extremities: No edema, no clubbing ,no cyanosis Skin: No rashes, lesions or ulcers,no icterus ,no pallor    Data Reviewed: I have personally reviewed following labs and imaging studies  CBC: Recent Labs  Lab 01/06/20 0454  01/07/20 0202 01/08/20 0553 01/09/20 0531 01/10/20 0221  WBC 14.2* 13.8* 16.1* 13.6* 13.2*  NEUTROABS 12.3* 12.3* 13.5* 9.2* 8.1*  HGB 10.3* 9.3* 9.2* 9.8* 9.4*  HCT 32.6* 29.3* 29.3* 31.8* 29.7*  MCV 94.8 95.4 95.8 97.5 95.2  PLT 308 305 336 406* [id number]*   Basic Metabolic Panel: Recent Labs  Lab 01/06/20 0454 01/07/20 0202 01/08/20 0553 01/10/20 0221  NA 139 141 142 140  K 4.3 4.1 3.7 3.7  CL 102 107 110 103  CO2 27 22 23 25   GLUCOSE 104* 89 109* 87  BUN 21 18 22 14   CREATININE 1.44* 1.06* 0.95 0.72  CALCIUM 8.4* 8.1* 8.6*  8.5*   GFR: Estimated Creatinine Clearance: 44.5 mL/min (by C-G formula based on SCr of 0.72 mg/dL). Liver Function Tests: Recent Labs  Lab 01/06/20 0454  AST 14*  ALT 13  ALKPHOS 64  BILITOT 1.6*  PROT 4.9*  ALBUMIN 2.1*   No results for input(s): LIPASE, AMYLASE in the last 168 hours. No results for input(s): AMMONIA in the last 168 hours. Coagulation Profile: Recent Labs  Lab 01/06/20 0454  INR 1.2   Cardiac Enzymes: No results for input(s): CKTOTAL, CKMB, CKMBINDEX, TROPONINI in the last 168 hours. BNP (last 3 results) No results for input(s): PROBNP in the last 8760 hours. HbA1C: No results for input(s): HGBA1C in the last 72 hours. CBG: Recent Labs  Lab 01/06/20 1535 01/06/20 1911 01/08/20 0624 01/08/20 2014  GLUCAP 87 88 96 121*   Lipid Profile: No results for input(s): CHOL, HDL, LDLCALC, TRIG, CHOLHDL, LDLDIRECT in the last 72 hours. Thyroid Function Tests: No results for input(s): TSH, T4TOTAL, FREET4, T3FREE, THYROIDAB in the last 72 hours. Anemia Panel: No results for input(s): VITAMINB12, FOLATE, FERRITIN, TIBC, IRON, RETICCTPCT in the last 72 hours. Sepsis Labs: Recent Labs  Lab 01/07/20 0202  LATICACIDVEN 0.8    Recent Results (from the past 240 hour(s))  Culture, blood (routine x 2)     Status: None (Preliminary result)   Collection Time: 01/08/20  8:11 AM   Specimen: BLOOD LEFT HAND  Result Value Ref Range Status   Specimen Description BLOOD LEFT HAND  Final   Special Requests   Final    BOTTLES DRAWN AEROBIC ONLY Blood Culture results may not be optimal due to an inadequate volume of blood received in culture bottles   Culture   Final    NO GROWTH 3 DAYS Performed at [facility], [place] [address], [place], [place] [address]    Report Status PENDING  Incomplete  Culture, blood (routine x 2)     Status: None (Preliminary result)   Collection Time: 01/08/20  8:12 AM   Specimen: BLOOD LEFT FOREARM  Result Value Ref Range Status    Specimen Description BLOOD LEFT FOREARM  Final   Special Requests   Final    BOTTLES DRAWN AEROBIC ONLY Blood Culture results may not be optimal due to an inadequate volume of blood received in culture bottles   Culture   Final    NO GROWTH 3 DAYS Performed at [facility], [place] [address], [place], [place] [address]    Report Status PENDING  Incomplete         Radiology Studies: DG Chest Port 1 View  Result Date: 01/11/2020 CLINICAL DATA:  Nausea EXAM: PORTABLE CHEST 1 VIEW COMPARISON:  12/30/2019 FINDINGS: Cardiomegaly. Left base atelectasis and small left pleural effusion. Right lung clear. Left rib fractures and remote left humeral neck  fracture again noted. IMPRESSION: Left base atelectasis, small left effusion. Electronically Signed   By: Rolm Baptise M.D.   On: 01/11/2020 10:07        Scheduled Meds: . acetaminophen  1,000 mg Oral Q6H  . amiodarone  200 mg Oral Daily  . bupivacaine liposome  20 mL Infiltration Once  . Chlorhexidine Gluconate Cloth  6 each Topical Daily  . feeding supplement  1 Container Oral BID BM  . metoprolol tartrate  25 mg Oral BID  . pantoprazole (PROTONIX) IV  40 mg Intravenous Daily  . polyethylene glycol  17 g Oral Daily  . predniSONE  10 mg Oral Q breakfast  . saccharomyces boulardii  250 mg Oral BID  . sodium chloride flush  3 mL Intravenous Q12H  . vitamin A  10,000 Units Oral Daily   Continuous Infusions: . heparin 1,200 Units/hr (01/10/20 1800)     LOS: 13 days    Time spent: 35 mins.More than 50% of that time was spent in counseling and/or coordination of care.      Shelly Coss, MD [facility] P3/12/2019, 11:15 AM

## 2020-01-11 NOTE — Progress Notes (Signed)
ANTICOAGULATION CONSULT NOTE   Pharmacy Consult for Heparin Indication: atrial fibrillation   Patient Measurements: Height: 5\' 6"  (167.6 cm) Weight: 164 lb 14.5 oz (74.8 kg) IBW/kg (Calculated) : 59.3 Heparin Dosing Weight: 74.8kg  Vital Signs: Temp: 97.2 F (36.2 C) (03/02 0158) Temp Source: Oral (03/02 0158) BP: 140/70 (03/02 0158) Pulse Rate: 71 (03/02 0158)  Labs: Recent Labs    01/09/20 0336 01/09/20 0531 01/10/20 0221 01/11/20 0416  HGB  --  9.8* 9.4*  --   HCT  --  31.8* 29.7*  --   PLT  --  406* 421*  --   HEPARINUNFRC 0.34  --  0.31 0.57  CREATININE  --   --  0.72  --     Estimated Creatinine Clearance: 44.5 mL/min (by C-G formula based on SCr of 0.72 mg/dL).   Medical History:  Assesment: 86 yo female with new onset afib (CHADS2VASc =4) who is s/p Ex lap and sigmoid colectomy to start on IV heparin with no boluses per Surgery service.  HL remains therapeutic today. H/H low stable. Plt up. Per Rn, no s/s of bleeding noted    Goal of Therapy:  Heparin level 0.3-0.7 units/ml Monitor platelets by anticoagulation protocol: Yes   Plan:  Continue heparin infusion at 1200 units/hr  Monitor heparin level, CBC, for s/sx of bleeding  Planning to transition to oral agent closer to discharge   Albertina Parr, PharmD., BCPS Clinical Pharmacist Clinical phone for 01/11/20 until 5pm: [phone]

## 2020-01-11 NOTE — Consult Note (Signed)
Ref: [name], MD   Subjective:  Abdominal pain unchanged but increased nausea today. No chest pain.  Afebrile. Monitor: atrial fibrillation with HR in 80's. BP 120's/70's. Increasing stool output from colostomy per nurse. Chronic left foot numbness and cold feeling.  Objective:  Vital Signs in the last 24 hours: Temp:  [97.2 F (36.2 C)-98.2 F (36.8 C)] 98 F (36.7 C) (03/02 0832) Pulse Rate:  [71-85] 81 (03/02 0832) Cardiac Rhythm: Atrial fibrillation (03/02 0700) Resp:  [20] 20 (03/02 0832) BP: (115-140)/(68-83) 129/71 (03/02 0832) SpO2:  [95 %-99 %] 99 % (03/02 0832)  Physical Exam: BP Readings from Last 1 Encounters:  01/11/20 129/71     Wt Readings from Last 1 Encounters:  01/05/20 74.8 kg    Weight change:  Body mass index is 26.62 kg/m. HEENT: [place]/AT, Eyes-Blue, PERL, EOMI, Conjunctiva-Pale pink, Sclera-Non-icteric Neck: No JVD, No bruit, Trachea midline. Lungs:  Clear, Bilateral. Cardiac:  Regular rhythm, normal S1 and S2, no S3. II/VI systolic murmur. Abdomen:  Soft, epigastric and umbilical area-tender. BS decreased. Extremities:  No edema present. No cyanosis. No clubbing. Left foot warm to touch. CNS: AxOx3, Cranial nerves grossly intact, moves all 4 extremities.  Skin: Warm and dry.   Intake/Output from previous day: 03/01 0701 - 03/02 0700 In: 1324.9 [P.O.:60; I.V.:964.6; IV Piggyback:300.3] Out: 2200 [Urine:1500; Stool:700]    Lab Results: BMET    Component Value Date/Time   NA 140 01/10/2020 0221   NA 142 01/08/2020 0553   NA 141 01/07/2020 0202   K 3.7 01/10/2020 0221   K 3.7 01/08/2020 0553   K 4.1 01/07/2020 0202   CL 103 01/10/2020 0221   CL 110 01/08/2020 0553   CL 107 01/07/2020 0202   CO2 25 01/10/2020 0221   CO2 23 01/08/2020 0553   CO2 22 01/07/2020 0202   GLUCOSE 87 01/10/2020 0221   GLUCOSE 109 (H) 01/08/2020 0553   GLUCOSE 89 01/07/2020 0202   BUN 14 01/10/2020 0221   BUN 22 01/08/2020 0553   BUN 18 01/07/2020  0202   CREATININE 0.72 01/10/2020 0221   CREATININE 0.95 01/08/2020 0553   CREATININE 1.06 (H) 01/07/2020 0202   CREATININE 0.95 (H) 01/26/2016 1552   CALCIUM 8.5 (L) 01/10/2020 0221   CALCIUM 8.6 (L) 01/08/2020 0553   CALCIUM 8.1 (L) 01/07/2020 0202   GFRNONAA >60 01/10/2020 0221   GFRNONAA 51 (L) 01/08/2020 0553   GFRNONAA 45 (L) 01/07/2020 0202   GFRAA >60 01/10/2020 0221   GFRAA 59 (L) 01/08/2020 0553   GFRAA 52 (L) 01/07/2020 0202   CBC    Component Value Date/Time   WBC 13.2 (H) 01/10/2020 0221   RBC 3.12 (L) 01/10/2020 0221   HGB 9.4 (L) 01/10/2020 0221   HCT 29.7 (L) 01/10/2020 0221   PLT 421 (H) 01/10/2020 0221   MCV 95.2 01/10/2020 0221   MCH 30.1 01/10/2020 0221   MCHC 31.6 01/10/2020 0221   RDW 14.9 01/10/2020 0221   LYMPHSABS 2.2 01/10/2020 0221   MONOABS 1.2 (H) 01/10/2020 0221   EOSABS 0.1 01/10/2020 0221   BASOSABS 0.1 01/10/2020 0221   HEPATIC Function Panel Recent Labs    06/22/19 1112 01/06/20 0454  PROT 6.6 4.9*   HEMOGLOBIN A1C No components found for: HGA1C,  MPG CARDIAC ENZYMES Lab Results  Component Value Date   TROPONINI <0.03 03/04/2016   BNP No results for input(s): PROBNP in the last 8760 hours. TSH Recent Labs    06/22/19 1112 01/07/20 1036  TSH  1.42 0.187*   CHOLESTEROL Recent Labs    06/22/19 1112  CHOL 190    Scheduled Meds: . acetaminophen  1,000 mg Oral Q6H  . amiodarone  200 mg Oral Daily  . bupivacaine liposome  20 mL Infiltration Once  . Chlorhexidine Gluconate Cloth  6 each Topical Daily  . feeding supplement  1 Container Oral BID BM  . metoprolol tartrate  5 mg Intravenous Q6H  . pantoprazole (PROTONIX) IV  40 mg Intravenous Daily  . polyethylene glycol  17 g Oral Daily  . predniSONE  10 mg Oral Q breakfast  . saccharomyces boulardii  250 mg Oral BID  . sodium chloride flush  3 mL Intravenous Q12H  . vitamin A  10,000 Units Oral Daily   Continuous Infusions: . heparin 1,200 Units/hr (01/10/20 1800)    PRN Meds:.acetaminophen **OR** acetaminophen, diltiazem, morphine injection, ondansetron (ZOFRAN) IV, oxyCODONE, promethazine  Assessment/Plan: Atrial fibrillation, chronic Acute perforated diverticulitis with peritonitis S/P Hartmann's procedure CKD, IIIa, improved HTN, controlled Hypothyroidism Polymyalgia Rheumatica  Change metoprolol to IV till nausea is improved. Eliquis per pharmacy. May need SNF or rehab till improved strength. F/U in 2-4 weeks at my office.   LOS: 13 days   Time spent including chart review, lab review, examination, discussion with patient, nurse and consulting physician : 30 min   Dixie Dials  MD  01/11/2020, 11:24 AM

## 2020-01-11 NOTE — Progress Notes (Signed)
Physical Therapy Treatment Patient Details Name: [name] MRN: [id number] DOB: 01-30-1923 Today's Date: 01/11/2020    History of Present Illness 86 yo female presenting to [facility] for severe left wrist pain after fall.  Pt reports that she had been experiencing upset stomach and diarrhea for a few days, had been lightheaded upon standing, had a fall without any significant appreciable injury yesterday, and then fell again this morning onto her left arm. Xrays show radial fx and left lateral 8th and 9th rib fxs (possibly the 7th as well). Pt underwent colostomy on 2/25.  PMH including is a HTN, hypothyroidism, polymyalgia rheumatica, and chronic kidney disease III.    PT Comments    Pt limited today by nausea despite being pre medicated. She was agreeable to mobilize OOB to the recliner chair with encouragement that mobility would help regulate her bowels, gas, and may help her nausea.  Son present throughout.  Education continued re: log roll, checking her ostomy bag regularly and safe mobility with platform walker.  OTA to also see today.  She remains appropriate for CIR level therapies at discharge.  PT will continue to follow acutely for safe mobility progression.   Follow Up Recommendations  CIR;Supervision/Assistance - 24 hour     Equipment Recommendations  Rolling walker with 5" wheels;3in1 (PT);Hospital bed;Wheelchair (measurements PT);Wheelchair cushion (measurements PT)(18x18 standard WC, left platform attachment for RW)    Recommendations for Other Services   NA     Precautions / Restrictions Precautions Precautions: Fall;Other (comment) Precaution Comments: L side ostomy bag, check befor mobilizing Restrictions Weight Bearing Restrictions: Yes LUE Weight Bearing: Non weight bearing    Mobility  Bed Mobility Overal bed mobility: Needs Assistance Bed Mobility: Rolling;Sidelying to Sit Rolling: Min assist Sidelying to sit: Min assist       General bed mobility  comments: Min assist to support trunk during transition to roll to her right as she cannot pull with her left arm, cues to bring arm across her tummy to roll, assist needed at trunk to power up to sitting EOB.  Cues to square up hips and get feet on the ground.    Transfers Overall transfer level: Needs assistance Equipment used: Left platform walker Transfers: Sit to/from Stand;Stand Pivot Transfers Sit to Stand: Min assist Stand pivot transfers: Min assist       General transfer comment: Min assist to power up from elevated bed, cues for safe hand placement, but pt feels better pulling on RW to stand (stabilized RW and educated that when she gets stronger we will need to switch to pushing off of the bed for safety), cues to reach back with her right hand to find chair to go to sitting, but did not, continued to hold to the RW for transition to sit.    Ambulation/Gait             General Gait Details: Pt was nauseated today despite being pre medicated with nausea meds she did not feel up to doing much.  Educated on the importance of continued mobility even on the days she feels bad to regulate her bowels, and the importance of nutrition as not eating much may also be contributing to her nausea.            Balance Overall balance assessment: Needs assistance Sitting-balance support: Feet supported;Bilateral upper extremity supported Sitting balance-Leahy Scale: Fair     Standing balance support: Bilateral upper extremity supported Standing balance-Leahy Scale: Poor Standing balance comment: needs external assist in  standing from therapist and L PFRW                            Cognition Arousal/Alertness: Awake/alert Behavior During Therapy: Flat affect Overall Cognitive Status: Within Functional Limits for tasks assessed                                 General Comments: Required increased encouragement to participate secondary due to nausea       Exercises General Exercises - Lower Extremity Ankle Circles/Pumps: AROM;Both;20 reps(encouraged for circulation/clot prevention)    General Comments General comments (skin integrity, edema, etc.): Pt in the 90s on RA during mobility today.  Left her on RA at end of the session and informed RN and OTA to spot check her.  Educated pt on checking her ostomy bag for trapped gas and stool before getting up and moving around to ensure it does not need burping or emptying before mobility.       Pertinent Vitals/Pain Pain Assessment: Faces Faces Pain Scale: Hurts a little bit Pain Location: abdomen Pain Descriptors / Indicators: Grimacing;Guarding Pain Intervention(s): Limited activity within patient's tolerance;Monitored during session;Repositioned           PT Goals (current goals can now be found in the care plan section) Acute Rehab PT Goals Patient Stated Goal: to decrease nausea Progress towards PT goals: Progressing toward goals    Frequency    Min 5X/week      PT Plan Current plan remains appropriate       AM-PAC PT "6 Clicks" Mobility   Outcome Measure  Help needed turning from your back to your side while in a flat bed without using bedrails?: A Little Help needed moving from lying on your back to sitting on the side of a flat bed without using bedrails?: A Little Help needed moving to and from a bed to a chair (including a wheelchair)?: A Little Help needed standing up from a chair using your arms (e.g., wheelchair or bedside chair)?: A Little Help needed to walk in hospital room?: A Little Help needed climbing 3-5 steps with a railing? : A Lot 6 Click Score: 17    End of Session   Activity Tolerance: Other (comment)(limited by nausea) Patient left: in chair;with call bell/phone within reach;with family/visitor present Nurse Communication: Mobility status;Other (comment)(stable O2 sats on RA--left O2 off) PT Visit Diagnosis: Unsteadiness on feet (R26.81);Other  abnormalities of gait and mobility (R26.89);Muscle weakness (generalized) (M62.81);Difficulty in walking, not elsewhere classified (R26.2);Repeated falls (R29.6);Pain Pain - Right/Left: Left Pain - part of body: Arm     Time: [id number] PT Time Calculation (min) (ACUTE ONLY): 24 min  Charges:  $Therapeutic Activity: 8-22 mins $Self Care/Home Management: [place], PT, DPT  Acute Rehabilitation [phone] pager #[phone] office  @ [facility]: [phone]   01/11/2020, 12:48 PM

## 2020-01-11 NOTE — Progress Notes (Signed)
ANTICOAGULATION CONSULT NOTE - [place] for Apixaban >> Heparin Indication: atrial fibrillation  Patient Measurements: Height: 5\' 6"  (167.6 cm) Weight: 164 lb 14.5 oz (74.8 kg) IBW/kg (Calculated) : 59.3 Heparin Dosing Weight: 75 kg  Vital Signs: Temp: 98 F (36.7 C) (03/02 0832) BP: 118/68 (03/02 1336) Pulse Rate: 87 (03/02 1336)  Labs: Recent Labs    01/09/20 0336 01/09/20 0531 01/09/20 0531 01/10/20 0221 01/11/20 0416 01/11/20 1139  HGB  --  9.8*   < > 9.4*  --  10.9*  HCT  --  31.8*  --  29.7*  --  34.5*  PLT  --  406*  --  421*  --  556*  HEPARINUNFRC 0.34  --   --  0.31 0.57  --   CREATININE  --   --   --  0.72  --   --    < > = values in this interval not displayed.    Estimated Creatinine Clearance: 44.5 mL/min (by C-G formula based on SCr of 0.72 mg/dL).   Medical History: Past Medical History:  Diagnosis Date  . DVT (deep venous thrombosis) ([place]) 09/2015   RLE  . GERD (gastroesophageal reflux disease)   . Hypertension   . Hypothyroidism   . 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 Peripheral vascular disease ([place])    peripheral neuropathy    Assessment: 86 yo female with new onset Afib (CHADS2VASc = 4) who was transitioned to Apixaban this afternoon and received one dose, however plans per surgery are now to transition back to Heparin.   The patient's last Apixaban dose was 3/2 @ 1330 - will plan to initiate the Heparin drip 12 hours from this last Apixaban dose at a previously therapeutic rate.  Given likelihood of false elevation in the heparin level with the recent Apixaban dose - will monitor using aPTTs initially.  Goal of Therapy:  Heparin level 0.3-0.7 units/ml aPTT 66-102 seconds Monitor platelets by anticoagulation protocol: Yes   Plan:  - Apixaban stopped by surgery - Restart Heparin at a rate of 1150 units/hr (11.5 ml/hr) starting 12 hours from the last Apixaban dose - Will  continue to monitor for any signs/symptoms of bleeding and will follow up with an APTT in 8 hours   Thank you for allowing pharmacy to be a part of this patient's care.  Alycia Rossetti, PharmD, BCPS Clinical Pharmacist Clinical phone for 01/11/2020: [phone] 01/11/2020 6:26 PM   **Pharmacist phone directory can now be found on [email or web address] ([email or web address]).  Listed under [place].

## 2020-01-11 NOTE — Progress Notes (Signed)
Occupational Therapy Treatment Patient Details Name: [name] MRN: [id number] DOB: 1923/09/12 Today's Date: 01/11/2020    History of present illness 86 yo female presenting to [facility] for severe left wrist pain after fall.  Pt reports that she had been experiencing upset stomach and diarrhea for a few days, had been lightheaded upon standing, had a fall without any significant appreciable injury yesterday, and then fell again this morning onto her left arm. Xrays show radial fx and left lateral 8th and 9th rib fxs (possibly the 7th as well). Pt underwent colostomy on 2/25.  PMH including is a HTN, hypothyroidism, polymyalgia rheumatica, and chronic kidney disease III.   OT comments  Patient continues to make steady progress towards goals in skilled OT session. Patient's session encompassed ADLs in sitting at pt had just transferred to the recliner with pt. Pt remains limited by significant nausea and despite IV nausea medicine, is still reporting significant discomfort. Pt able to complete ADLs in set up, however would continue to benefit greatly from CIR in order to regain functional independence. Prior to admission, pt was independent, and family would check in on her intermittently. Pt has terrific family support; who would all benefit greatly from education provided at [facility] in order to assist when pt is discharged home to decrease overall burden care and promote functional aging in place. Pt would continue to benefit from skilled services; will follow acutely.    Follow Up Recommendations  CIR    Equipment Recommendations  3 in 1 bedside commode    Recommendations for Other Services      Precautions / Restrictions Precautions Precautions: Fall Precaution Comments: Sling for comfort. Benefits from wearing it during mobility Restrictions Weight Bearing Restrictions: Yes LUE Weight Bearing: Non weight bearing       Mobility Bed Mobility                  Transfers                       Balance                                           ADL either performed or assessed with clinical judgement   ADL Overall ADL's : Needs assistance/impaired     Grooming: Set up;Sitting;Brushing hair;Oral care;Wash/dry face;Wash/dry Nurse, mental health Details (indicate cue type and reason): Engaged in ADLs in recliner due to continued nausea despite IV pain meds                             Functional mobility during ADLs: Set up General ADL Comments: Pt set with ADLs in chair, however unable to progress due to continued nausea (pt is min-mod to complete functional ADLs in standing)     Vision       Perception     Praxis      Cognition Arousal/Alertness: Awake/alert Behavior During Therapy: WFL for tasks assessed/performed Overall Cognitive Status: Within Functional Limits for tasks assessed                                 General Comments: Required increased encouragement to participate secondary due to nausea        Exercises     Shoulder Instructions  General Comments      Pertinent Vitals/ Pain       Pain Assessment: Faces Faces Pain Scale: Hurts a little bit Pain Location: Nausea Pain Descriptors / Indicators: Grimacing;Discomfort;Guarding;Aching Pain Intervention(s): Limited activity within patient's tolerance;Monitored during session;Premedicated before session;Repositioned;Relaxation  Home Living                                          Prior Functioning/Environment              Frequency  Min 3X/week        Progress Toward Goals  OT Goals(current goals can now be found in the care plan section)  Progress towards OT goals: Progressing toward goals  Acute Rehab OT Goals Patient Stated Goal: Return to home and be independent again OT Goal Formulation: With patient Time For Goal Achievement: 01/17/20 Potential to Achieve Goals: Good  Plan Discharge plan  remains appropriate    Co-evaluation                 AM-PAC OT "6 Clicks" Daily Activity     Outcome Measure   Help from another person eating meals?: A Little Help from another person taking care of personal grooming?: A Little Help from another person toileting, which includes using toliet, bedpan, or urinal?: A Lot Help from another person bathing (including washing, rinsing, drying)?: A Lot Help from another person to put on and taking off regular upper body clothing?: A Lot Help from another person to put on and taking off regular lower body clothing?: A Lot 6 Click Score: 14    End of Session    OT Visit Diagnosis: Unsteadiness on feet (R26.81);Other abnormalities of gait and mobility (R26.89);Muscle weakness (generalized) (M62.81);Pain   Activity Tolerance Patient tolerated treatment well;Patient limited by pain   Patient Left in chair;with call bell/phone within reach;with family/visitor present;with chair alarm set   Nurse Communication Mobility status        Time: [id number] OT Time Calculation (min): 10 min  Charges: OT General Charges $OT Visit: 1 Visit OT Treatments $Self Care/Home Management : 8-22 mins  Corinne Ports E. [place], [place] Acute Rehabilitation Services [place] 01/11/2020, 11:55 AM

## 2020-01-11 NOTE — PMR Pre-admission (Signed)
PMR Admission Coordinator Pre-Admission Assessment   Patient: [name] is an 86 y.o., female MRN: [id number] DOB: 09/20/1923 Height: 5' 6" (167.6 cm) Weight: 74.8 kg   Insurance Information HMO:     PPO: yes     PCP:      IPA:      80/20:      OTHER:  PRIMARY: [employer]      Policy#: [id number]      Subscriber: pt CM Name: Sandy      Phone#: [phone]     Fax#: [phone] Pre-Cert#: [id number] auth for CIR provided by Sandy with [employer].  Updates due to Kierra Fells at fax listed above on 3/11.       Employer: n/a Benefits:  Phone #: [phone]     Name:  Eff. Date: 11/12/19     Deduct: $0      Out of Pocket Max: $4000 (met $335.09)      Life Max: n/a CIR: $160/day for days 1-10      SNF: 20 full days Outpatient:      Co-Pay: $20 Home Health: 100%      Co-Pay:  DME: 80%     Co-Pay: 20% Providers: preferred network SECONDARY:       Policy#:       Subscriber:  CM Name:       Phone#:      Fax#:  Pre-Cert#:       Employer:  Benefits:  Phone #:      Name:  Eff. Date:      Deduct:       Out of Pocket Max:       Life Max:  CIR:       SNF:  Outpatient:      Co-Pay:  Home Health:       Co-Pay:  DME:      Co-Pay:    Medicaid Application Date:       Case Manager:  Disability Application Date:       Case Worker:    The "Data Collection Information Summary" for patients in Inpatient Rehabilitation Facilities with attached "Privacy Act Statement-Health Care Records" was provided and verbally reviewed with: Patient   Emergency Contact Information         Contact Information     Name Relation Home Work Mobile    Stone,Martha Daughter [phone]    Gianfrancesco,Van Son [phone]   [phone]    Hetzer,Thomas Son [phone]             Current Medical History  Patient Admitting Diagnosis: debility following L radial fx (s/p ORIF) and perforated diverticulitis (s/p sigmoid colectomy with end ostomy)    History of Present Illness:   Pt is a 86 y/o female, PMH significant for HTN, hypothyroidism, polymyalgia rheumatica, CKD III, and PVD admitted on 12/29/2019 following a fall at home.  Xrays showed L distal radius fracture which was repaired by Dr. Ortmann on 2/20.  Postop therapy recommendations were for CIR due to functional decline and age, but insurance authorization was denied.  Overnight on 2/23 pt developed severe abdominal pain with decrease in appetite.  CT confirmed pneumoperiotneum likely 2/2 perforated diverticuli.  Pt does endorse 2 week history of diarrhea but denies blood in stool.  General surgery was consulted and pt underwent lap chole with sigmoid colectomy and end ostomy per Dr. Wilson on 2/25.  CIR was recommended again to maximize pt independence prior to returning home.    Patient's   medical record from [facility] has been reviewed by the rehabilitation admission coordinator and physician.   Past Medical History      Past Medical History:  Diagnosis Date  . DVT (deep venous thrombosis) (HCC) 09/2015    RLE  . GERD (gastroesophageal reflux disease)    . Hypertension    . Hypothyroidism    . Melanoma (HCC) 06/16/2017    Facial melanoma, removed by Dr. Leshin  . Peripheral vascular disease (HCC)      peripheral neuropathy      Family History   family history includes COPD in her father; Cancer in her daughter and mother; Emphysema in her father.   Prior Rehab/Hospitalizations Has the patient had prior rehab or hospitalizations prior to admission? No   Has the patient had major surgery during 100 days prior to admission? Yes              Current Medications   Current Facility-Administered Medications:  .  acetaminophen (TYLENOL) tablet 500 mg, 500 mg, Oral, Q6H, Kadakia, Ajay, MD, 500 mg at 01/13/20 1825 .  amiodarone (PACERONE) tablet 200 mg, 200 mg, Oral, Daily, Harwani, Mohan, MD, 200 mg at 01/14/20 0843 .  amoxicillin-clavulanate (AUGMENTIN) 875-125 MG per tablet 1 tablet, 1 tablet,  Oral, Q12H, Amin, Kaliyah Gladman Chirag, MD, 1 tablet at 01/14/20 0842 .  apixaban (ELIQUIS) tablet 5 mg, 5 mg, Oral, BID, Oriet, Jonathan, RPH, 5 mg at 01/14/20 0843 .  bethanechol (URECHOLINE) tablet 5 mg, 5 mg, Oral, TID, Meuth, Brooke A, PA-C .  bupivacaine liposome (EXPAREL) 1.3 % injection 266 mg, 20 mL, Infiltration, Once, Maczis, Michael M, PA-C .  Chlorhexidine Gluconate Cloth 2 % PADS 6 each, 6 each, Topical, Daily, Adhikari, Amrit, MD, 6 each at 01/11/20 1843 .  diltiazem (CARDIZEM CD) 24 hr capsule 120 mg, 120 mg, Oral, Daily, Kadakia, Ajay, MD, 120 mg at 01/14/20 0844 .  levothyroxine (SYNTHROID) tablet 50 mcg, 50 mcg, Oral, QAC breakfast, Kadakia, Ajay, MD, 50 mcg at 01/14/20 0546 .  metoprolol tartrate (LOPRESSOR) tablet 25 mg, 25 mg, Oral, BID, Kadakia, Ajay, MD, 25 mg at 01/14/20 0843 .  morphine 2 MG/ML injection 2 mg, 2 mg, Intravenous, Q6H PRN, Adhikari, Amrit, MD, 2 mg at 01/11/20 1656 .  ondansetron (ZOFRAN) injection 4 mg, 4 mg, Intravenous, Q6H PRN, Maczis, Michael M, PA-C, 4 mg at 01/13/20 0021 .  oxyCODONE (Oxy IR/ROXICODONE) immediate release tablet 5-7.5 mg, 5-7.5 mg, Oral, Q4H PRN, Lovick, Ayesha N, MD, 5 mg at 01/11/20 1522 .  pantoprazole (PROTONIX) EC tablet 40 mg, 40 mg, Oral, Q1200, Kadakia, Ajay, MD, 40 mg at 01/13/20 1137 .  polyethylene glycol (MIRALAX / GLYCOLAX) packet 17 g, 17 g, Oral, Daily, Maczis, Michael M, PA-C, 17 g at 01/10/20 0925 .  polyethylene glycol (MIRALAX / GLYCOLAX) packet 17 g, 17 g, Oral, Daily PRN, Amin, Kaydie Petsch Chirag, MD .  predniSONE (DELTASONE) tablet 10 mg, 10 mg, Oral, Q breakfast, Maczis, Michael M, PA-C, 10 mg at 01/14/20 0844 .  promethazine (PHENERGAN) injection 12.5 mg, 12.5 mg, Intravenous, Q6H PRN, Adhikari, Amrit, MD, 12.5 mg at 01/11/20 0906 .  saccharomyces boulardii (FLORASTOR) capsule 250 mg, 250 mg, Oral, BID, Maczis, Michael M, PA-C, 250 mg at 01/14/20 0843 .  senna-docusate (Senokot-S) tablet 2 tablet, 2 tablet, Oral, QHS PRN,  Amin, Mahati Vajda Chirag, MD .  sodium chloride flush (NS) 0.9 % injection 3 mL, 3 mL, Intravenous, Q12H, Maczis, Michael M, PA-C, 3 mL at 01/14/20 0849 .  vitamin A   capsule 10,000 Units, 10,000 Units, Oral, Daily, Lovick, Ayesha N, MD, 10,000 Units at 01/14/20 0844   Patients Current Diet:     Diet Order                      DIET SOFT Room service appropriate? Yes; Fluid consistency: Thin  Diet effective now                   Precautions / Restrictions Precautions Precautions: Fall Precaution Comments: L side ostomy bag, check before mobilizing Restrictions Weight Bearing Restrictions: Yes LUE Weight Bearing: Weight bear through elbow only Other Position/Activity Restrictions: may use platform walker    Has the patient had 2 or more falls or a fall with injury in the past year? Yes   Prior Activity Level Community (5-7x/wk): independent PLOF, living alone with close family support, driving, went out daily   Prior Functional Level Self Care: Did the patient need help bathing, dressing, using the toilet or eating? Independent   Indoor Mobility: Did the patient need assistance with walking from room to room (with or without device)? Independent   Stairs: Did the patient need assistance with internal or external stairs (with or without device)? Independent   Functional Cognition: Did the patient need help planning regular tasks such as shopping or remembering to take medications? Independent   Home Assistive Devices / Equipment Home Assistive Devices/Equipment: Walker (specify type), Cane (specify quad or straight) Home Equipment: Walker - 2 wheels, Grab bars - tub/shower   Prior Device Use: Indicate devices/aids used by the patient prior to current illness, exacerbation or injury? None of the above   Current Functional Level Cognition   Overall Cognitive Status: Within Functional Limits for tasks assessed Orientation Level: Oriented X4 General Comments: increased time and  effort to mobilize, secondary to nausea    Extremity Assessment (includes Sensation/Coordination)   Upper Extremity Assessment: Defer to OT evaluation LUE Deficits / Details: Left radial fx. Long splint in place. Pt able to move digits. Difficulty performing AROM due to pain adn weight of RUE.  LUE Coordination: decreased fine motor, decreased gross motor  Lower Extremity Assessment: Generalized weakness, LLE deficits/detail LLE Deficits / Details: weakness in ankle noted     ADLs   Overall ADL's : Needs assistance/impaired Eating/Feeding: Set up, Sitting Eating/Feeding Details (indicate cue type and reason): Using one hand. Will need assistance for bilateral tasks Grooming: Oral care, Sitting, Set up, Brushing hair Grooming Details (indicate cue type and reason): seated in recliner Upper Body Bathing: Moderate assistance, Sitting Lower Body Bathing: Moderate assistance Lower Body Bathing Details (indicate cue type and reason): Pt performing peri care at sink with Min A for balance; requiring increased assistance for distal areas Upper Body Dressing : Moderate assistance, Sitting Upper Body Dressing Details (indicate cue type and reason): dressing L UE first Lower Body Dressing: Moderate assistance, Sit to/from stand Lower Body Dressing Details (indicate cue type and reason): crossed foot over opposite knee to pull up socks Toilet Transfer: Minimal assistance, Ambulation, BSC, RW Toilet Transfer Details (indicate cue type and reason): Simulated with recliner, more so for equipment and "woozy feeling" Toileting- Clothing Manipulation and Hygiene: Sitting/lateral lean, Min guard Functional mobility during ADLs: Minimal assistance, Rolling walker General ADL Comments: Pt set with ADLs in chair, however unable to progress due to continued nausea (pt is min-mod to complete functional ADLs in standing)     Mobility   Overal bed mobility: Needs Assistance Bed Mobility: Supine to   Sit Rolling:  Min assist Sidelying to sit: Min assist Supine to sit: Supervision Sit to sidelying: Min assist General bed mobility comments: no physical assist, HOB up     Transfers   Overall transfer level: Needs assistance Equipment used: Left platform walker Transfers: Sit to/from Stand Sit to Stand: Mod assist Stand pivot transfers: Min assist General transfer comment: assist to rise and steady     Ambulation / Gait / Stairs / Wheelchair Mobility   Ambulation/Gait Ambulation/Gait assistance: Min assist Gait Distance (Feet): 10 Feet Assistive device: Left platform walker Gait Pattern/deviations: Step-through pattern, Drifts right/left, Decreased step length - left, Decreased dorsiflexion - left, Narrow base of support, Trunk flexed, Decreased stride length General Gait Details: Min assist for steadying, guiding pt and RW. Verbal cuing for correct placement of self in RW, placement of LUE. Gait velocity: decr Gait velocity interpretation: <1.31 ft/sec, indicative of household ambulator     Posture / Balance Dynamic Sitting Balance Sitting balance - Comments: Pt sitting sideways due to abdominal pain initially but was able to get her comfortable prior to standing.  Balance Overall balance assessment: Needs assistance Sitting-balance support: Feet supported, Bilateral upper extremity supported Sitting balance-Leahy Scale: Good Sitting balance - Comments: Pt sitting sideways due to abdominal pain initially but was able to get her comfortable prior to standing.  Standing balance support: Bilateral upper extremity supported Standing balance-Leahy Scale: Poor Standing balance comment: reliant on UE support of RW and min assist     Special needs/care consideration BiPAP/CPAP no CPM no Continuous Drip IV no Dialysis no        Days n/a Life Vest no Oxygen no Special Bed no Trach Size no Wound Vac (area) no      Location n/a Skin incision to L wrist and abdomen, new stoma to RUQ                            Bowel mgmt: colostomy Bladder mgmt: incontinent Diabetic mgmt: no Behavioral consideration no Chemo/radiation no    Previous Home Environment (from acute therapy documentation) Living Arrangements: Alone Available Help at Discharge: Family, Available 24 hours/day Type of Home: House Home Layout: One level Home Access: Stairs to enter Entrance Stairs-Rails: None Entrance Stairs-Number of Steps: 1 Bathroom Shower/Tub: Walk-in shower Bathroom Toilet: Handicapped height Home Care Services: Yes Type of Home Care Services: Housekeeping Additional Comments: Son and daughter who lives nextdoor   Discharge Living Setting Plans for Discharge Living Setting: Patient's home Type of Home at Discharge: House Discharge Home Layout: One level Discharge Home Access: Stairs to enter Entrance Stairs-Rails: Can reach both Entrance Stairs-Number of Steps: 1 Discharge Bathroom Shower/Tub: Tub/shower unit Discharge Bathroom Toilet: Handicapped height Discharge Bathroom Accessibility: Yes How Accessible: Accessible via walker Does the patient have any problems obtaining your medications?: No   Social/Family/Support Systems Anticipated Caregiver: daughter Martha (colostomy care), sons Tom and Van for mobility/other needs Anticipated Caregiver's Contact Information: Martha [phone], Tom [phone] Ability/Limitations of Caregiver: n/a Caregiver Availability: 24/7 Discharge Plan Discussed with Primary Caregiver: Yes Is Caregiver In Agreement with Plan?: Yes Does Caregiver/Family have Issues with Lodging/Transportation while Pt is in Rehab?: No   Goals/Additional Needs Patient/Family Goal for Rehab: PT/OT mod I Expected length of stay: 5-7 days Dietary Needs: reg/thin Equipment Needs: tbd Pt/Family Agrees to Admission and willing to participate: Yes Program Orientation Provided & Reviewed with Pt/Caregiver Including Roles  & Responsibilities: Yes   Decrease burden of Care  through IP   rehab admission: Patient/family education for ostomy   Possible need for SNF placement upon discharge: No.  Pt/family refusing SNF level care.    Patient Condition: I have reviewed medical records from [facility], spoken with CM, and patient and son. I met with patient at the bedside for inpatient rehabilitation assessment.  Patient will benefit from ongoing PT and OT, can actively participate in 3 hours of therapy a day 5 days of the week, and can make measurable gains during the admission.  Patient will also benefit from the coordinated team approach during an Inpatient Acute Rehabilitation admission.  The patient will receive intensive therapy as well as Rehabilitation physician, nursing, social worker, and care management interventions.  Due to bladder management, bowel management, safety, skin/wound care, medication administration, pain management and patient education the patient requires 24 hour a day rehabilitation nursing.  The patient is currently min to min guard with mobility and basic ADLs.  Discharge setting and therapy post discharge at home is anticipated.  Patient has agreed to participate in the Acute Inpatient Rehabilitation Program and will admit today.   Preadmission Screen Completed By:  Caitlin E Warren, PT, DPT 01/14/2020 9:44 AM ______________________________________________________________________   Discussed status with Dr. Savahna Casados on 01/14/20  at 9:44 AM  and received approval for admission today.   Admission Coordinator:  Caitlin E Warren, PT, DPT time 9:44 AM /Date 01/14/20     Assessment/Plan: Diagnosis: debility   1. Does the need for close, 24 hr/day Medical supervision in concert with the patient's rehab needs make it unreasonable for this patient to be served in a less intensive setting? Potentially  2. Co-Morbidities requiring supervision/potential complications: HTN, hypothyroidism, polymyalgia rheumatica, CKD III, and PVD  3. Due to safety,  skin/wound care, disease management, pain management and patient education, does the patient require 24 hr/day rehab nursing? Potentially 4. Does the patient require coordinated care of a physician, rehab nurse, PT, OT to address physical and functional deficits in the context of the above medical diagnosis(es)? Potentially Addressing deficits in the following areas: balance, endurance, locomotion, strength, transferring, bathing, dressing, toileting and psychosocial support 5. Can the patient actively participate in an intensive therapy program of at least 3 hrs of therapy 5 days a week? Yes 6. The potential for patient to make measurable gains while on inpatient rehab is excellent and good 7. Anticipated functional outcomes upon discharge from inpatient rehab: supervision PT, supervision and min assist OT, n/a SLP 8. Estimated rehab length of stay to reach the above functional goals is: 4-7 days. 9. Anticipated discharge destination: Potentially home 10. Overall Rehab/Functional Prognosis: excellent     MD Signature: Saumya Hukill, MD, ABPMR  

## 2020-01-12 DIAGNOSIS — S52502G Unspecified fracture of the lower end of left radius, subsequent encounter for closed fracture with delayed healing: Secondary | ICD-10-CM

## 2020-01-12 LAB — CBC WITH DIFFERENTIAL/PLATELET
Abs Immature Granulocytes: 1.43 10*3/uL — ABNORMAL HIGH (ref 0.00–0.07)
Basophils Absolute: 0.1 10*3/uL (ref 0.0–0.1)
Basophils Relative: 1 %
Eosinophils Absolute: 0.3 10*3/uL (ref 0.0–0.5)
Eosinophils Relative: 2 %
HCT: 32.9 % — ABNORMAL LOW (ref 36.0–46.0)
Hemoglobin: 10.3 g/dL — ABNORMAL LOW (ref 12.0–15.0)
Immature Granulocytes: 8 %
Lymphocytes Relative: 18 %
Lymphs Abs: 3 10*3/uL (ref 0.7–4.0)
MCH: 30 pg (ref 26.0–34.0)
MCHC: 31.3 g/dL (ref 30.0–36.0)
MCV: 95.9 fL (ref 80.0–100.0)
Monocytes Absolute: 1.1 10*3/uL — ABNORMAL HIGH (ref 0.1–1.0)
Monocytes Relative: 6 %
Neutro Abs: 11.1 10*3/uL — ABNORMAL HIGH (ref 1.7–7.7)
Neutrophils Relative %: 65 %
Platelets: 556 10*3/uL — ABNORMAL HIGH (ref 150–400)
RBC: 3.43 MIL/uL — ABNORMAL LOW (ref 3.87–5.11)
RDW: 15.3 % (ref 11.5–15.5)
WBC: 17 10*3/uL — ABNORMAL HIGH (ref 4.0–10.5)
nRBC: 0.2 % (ref 0.0–0.2)

## 2020-01-12 LAB — BASIC METABOLIC PANEL
Anion gap: 11 (ref 5–15)
BUN: 11 mg/dL (ref 8–23)
CO2: 25 mmol/L (ref 22–32)
Calcium: 8.7 mg/dL — ABNORMAL LOW (ref 8.9–10.3)
Chloride: 104 mmol/L (ref 98–111)
Creatinine, Ser: 0.88 mg/dL (ref 0.44–1.00)
GFR calc Af Amer: >60 mL/min (ref >60)
GFR calc non Af Amer: 56 mL/min — ABNORMAL LOW (ref >60)
Glucose, Bld: 84 mg/dL (ref 70–99)
Potassium: 3.5 mmol/L (ref 3.5–5.1)
Sodium: 140 mmol/L (ref 135–145)

## 2020-01-12 MED ORDER — POLYETHYLENE GLYCOL 3350 17 G PO PACK: 17.0000 g | PACK | Freq: Every day | ORAL | Status: DC | PRN

## 2020-01-12 MED ORDER — LEVOTHYROXINE SODIUM 100 MCG/5ML IV SOLN
25.0000 ug | Freq: Every day | INTRAVENOUS | Status: DC
  Administered 2020-01-12: 25 ug via INTRAVENOUS
  Filled 2020-01-12 (×2): qty 5

## 2020-01-12 MED ORDER — SENNOSIDES-DOCUSATE SODIUM 8.6-50 MG PO TABS: 2.0000 | ORAL_TABLET | Freq: Every evening | ORAL | Status: DC | PRN

## 2020-01-12 MED ORDER — APIXABAN 5 MG PO TABS
5.0000 mg | ORAL_TABLET | Freq: Two times a day (BID) | ORAL | Status: DC
  Administered 2020-01-12 – 2020-01-14 (×5): 5 mg via ORAL
  Filled 2020-01-12 (×5): qty 1

## 2020-01-12 MED ORDER — AMOXICILLIN-POT CLAVULANATE 875-125 MG PO TABS
1.0000 | ORAL_TABLET | Freq: Two times a day (BID) | ORAL | Status: DC
  Administered 2020-01-12 – 2020-01-14 (×5): 1 via ORAL
  Filled 2020-01-12 (×5): qty 1

## 2020-01-12 NOTE — Progress Notes (Addendum)
[facility] Progress Note  6 Days Post-Op  Subjective: CC-  Nausea improved this morning, although has minimal appetite. Colostomy functioning with air and stool in pouch. CT yesterday showed two fluid collections: LLQ with mild rim enhancement 6.1 by 1.8 by 4.7 cm, and right posterior pelvic with rim enhancing 3 by 1.5 by 1.6 cm. IR was asked to evaluate for a drain. Note from this morning states that they feel these fluid collections are more likely blood than abscess and do not recommend drain at this point. Consider repeat CT scan in 72 hours.  Objective: Vital signs in last 24 hours: Temp:  [98 F (36.7 C)-98.5 F (36.9 C)] 98.5 F (36.9 C) (03/03 0832) Pulse Rate:  [78-93] 78 (03/03 0832) Resp:  [16-18] 16 (03/03 0832) BP: (113-140)/(68-93) 124/71 (03/03 0832) SpO2:  [94 %-98 %] 94 % (03/03 0832) Last BM Date: 01/11/20(ostomy)  Intake/Output from previous day: 03/02 0701 - 03/03 0700 In: 956.4 [P.O.:795; I.V.:111.4; IV Piggyback:50] Out: 4360 [Urine:3860; Stool:500] Intake/Output this shift: Total I/O In: -  Out: 600 [Urine:600]  PE: Gen:  Alert, NAD, pleasant HEENT: EOM's intact, pupils equal and round Card:  RRR Pulm:  CTAB, no W/R/R, rate and effort normal Abd: Soft, NT/ND, +BS, ostomy viable with liquid stool and air in pouch Psych: A&Ox3 Skin: no rashes noted, warm and dry   Lab Results:  Recent Labs    01/11/20 1139 01/12/20 0527  WBC 20.3* 17.0*  HGB 10.9* 10.3*  HCT 34.5* 32.9*  PLT 556* 556*   BMET Recent Labs    01/10/20 0221 01/12/20 0527  NA 140 140  K 3.7 3.5  CL 103 104  CO2 25 25  GLUCOSE 87 84  BUN 14 11  CREATININE 0.72 0.88  CALCIUM 8.5* 8.7*   PT/INR No results for input(s): LABPROT, INR in the last 72 hours. CMP     Component Value Date/Time   NA 140 01/12/2020 0527   K 3.5 01/12/2020 0527   CL 104 01/12/2020 0527   CO2 25 01/12/2020 0527   GLUCOSE 84 01/12/2020 0527   BUN 11 01/12/2020 0527   CREATININE  0.88 01/12/2020 0527   CREATININE 0.95 (H) 01/26/2016 1552   CALCIUM 8.7 (L) 01/12/2020 0527   PROT 4.9 (L) 01/06/2020 0454   ALBUMIN 2.1 (L) 01/06/2020 0454   AST 14 (L) 01/06/2020 0454   ALT 13 01/06/2020 0454   ALKPHOS 64 01/06/2020 0454   BILITOT 1.6 (H) 01/06/2020 0454   GFRNONAA 56 (L) 01/12/2020 0527   GFRAA >60 01/12/2020 0527   Lipase     Component Value Date/Time   LIPASE 25.0 03/06/2016 1123       Studies/Results: CT ABDOMEN PELVIS W CONTRAST  Result Date: 01/11/2020 CLINICAL DATA:  Intra-abdominal abscess EXAM: CT ABDOMEN AND PELVIS WITH CONTRAST TECHNIQUE: Multidetector CT imaging of the abdomen and pelvis was performed using the standard protocol following bolus administration of intravenous contrast. CONTRAST:  143mL OMNIPAQUE IOHEXOL 300 MG/ML  SOLN COMPARISON:  CT 01/05/2020, 03/04/2016 FINDINGS: Lower chest: Lung bases demonstrate interim development of small pleural effusions. Partial consolidation left base likely atelectasis. Cardiomegaly with coronary vascular calcification. Hepatobiliary: Chronic wedge shaped hypoenhancing region in the peripheral right hepatic lobe. Gallstones. No biliary dilatation Pancreas: Fatty atrophy.  No inflammatory changes Spleen: Normal in size without focal abnormality. Adrenals/Urinary Tract: Adrenal glands are within normal limits. Cyst within the bilateral kidneys. Small amount of air in the urinary bladder. Stomach/Bowel: The stomach is nonenlarged. No dilated small bowel.  Interval sigmoid colectomy with creation of left lower quadrant colostomy. Mild wall thickening and inflammatory change involving the left lower quadrant ostomy. Vascular/Lymphatic: Moderate aortic atherosclerosis. No aneurysm. No significant adenopathy. Retroaortic left renal vein. Reproductive: Status post hysterectomy. No adnexal masses. Other: Resolution of previously noted free air. Interval ventral wound. Fluid in the bilateral lower quadrants. Left lower quadrant  fluid collection demonstrates mild rim enhancement. This measures 6.1 by 1.8 by 4.7 cm. Small rim enhancing fluid collection in the right posterior pelvis measures 3 by 1.5 by 1.6 cm, series 3, image number 64. Fluid in the right lower quadrant without discernible rim enhancement at this time. Musculoskeletal: Displaced left ninth posterolateral rib fracture. Moderate wedge compression deformity at T11. Advanced degenerative changes of the lumbar spine. IMPRESSION: 1. Interval sigmoid colectomy with creation of left lower quadrant colostomy. Mildly rim enhancing fluid collections within the left lower quadrant and right posterior pelvis, suspect for potential developing abscess. Small amount of fluid in the right lower quadrant as well though without appreciable rim enhancement at this time. Resolution of previously noted free air. 2. Slight thickened appearance of the left lower quadrant colostomy with surrounding soft tissue stranding, possibly due to postoperative edema and inflammation. 3. New small pleural effusions with atelectasis at the left base. 4. Gallstones Electronically Signed   By: Donavan Foil M.D.   On: 01/11/2020 17:57   DG Chest Port 1 View  Result Date: 01/11/2020 CLINICAL DATA:  Nausea EXAM: PORTABLE CHEST 1 VIEW COMPARISON:  12/30/2019 FINDINGS: Cardiomegaly. Left base atelectasis and small left pleural effusion. Right lung clear. Left rib fractures and remote left humeral neck fracture again noted. IMPRESSION: Left base atelectasis, small left effusion. Electronically Signed   By: Rolm Baptise M.D.   On: 01/11/2020 10:07   DG Abd 2 Views  Result Date: 01/11/2020 CLINICAL DATA:  Abdominal pain with nausea this morning. EXAM: ABDOMEN - 2 VIEW COMPARISON:  Abdominal CT 01/05/2020. FINDINGS: The bowel gas pattern is nonobstructive. There is no free intraperitoneal air or bowel wall thickening. There are small left-greater-than-right pleural effusions with associated left lower lobe  atelectasis. No suspicious abdominal calcifications. Degenerative changes throughout the spine, a convex right lumbar scoliosis and chronic compression deformity at T11 are unchanged. IMPRESSION: No acute abdominal findings. Small pleural effusions and left lower lobe atelectasis. Electronically Signed   By: Richardean Sale M.D.   On: 01/11/2020 13:54    Anti-infectives: Anti-infectives (From admission, onward)   Start     Dose/Rate Route Frequency Ordered Stop   01/11/20 2030  piperacillin-tazobactam (ZOSYN) IVPB 3.375 g     3.375 g 12.5 mL/hr over 240 Minutes Intravenous Every 8 hours 01/11/20 1926     01/06/20 0230  piperacillin-tazobactam (ZOSYN) IVPB 3.375 g     3.375 g 12.5 mL/hr over 240 Minutes Intravenous Every 8 hours 01/05/20 1952 01/10/20 2158   01/05/20 2000  piperacillin-tazobactam (ZOSYN) IVPB 3.375 g     3.375 g 100 mL/hr over 30 Minutes Intravenous  Once 01/05/20 1952 01/05/20 2049   01/01/20 2030  ceFAZolin (ANCEF) IVPB 1 g/50 mL premix  Status:  Discontinued     1 g 100 mL/hr over 30 Minutes Intravenous Every 8 hours 01/01/20 1307 01/03/20 1357   01/01/20 1315  ceFAZolin (ANCEF) IVPB 1 g/50 mL premix     1 g 100 mL/hr over 30 Minutes Intravenous NOW 01/01/20 1307 01/01/20 1454   01/01/20 0600  ceFAZolin (ANCEF) IVPB 2g/100 mL premix  Status:  Discontinued  2 g 200 mL/hr over 30 Minutes Intravenous To Short Stay 12/31/19 1928 01/01/20 1259   01/01/20 0600  vancomycin (VANCOCIN) IVPB 1000 mg/200 mL premix     1,000 mg 200 mL/hr over 60 Minutes Intravenous To Short Stay 12/31/19 1928 01/01/20 1055       Assessment/Plan HTN Hypothyroidism Hx DVT in 2016 GERD PVD Recent fall w/ left wrist fx s/pL wrist repair by Dr. Caralyn Guile on 2/20 Urinary retention - attempted foley removal 3/2 and had to be replaced  Perforated sigmoid diverticulitis with purulent peritonitis - s/p ex lap,sigmoid colectomy with end colostomybyDr. Redmond Pulling 01/06/2020 - POD#6 - Ostomy  functioning, monitor output.  - BID wet to dry dressing changes, WOC on board for ostomy teaching and dressing changes CKD w/ AKI- Cr normalized New onset Atrial fibrillation- cardiology following, on metoprolol. Recommending transitioning to eliquis which was done and subsequently stopped yesterday due to CT scan findings and the possibility of needing an IR drain. On IV heparin today ABL anemia- Hgb 10.3, stable. Monitor FEN -FLD Endo- h/o Polymyalgia rheumaticaon PO Prednisone10mg  daily. Continue vitamin A to mitigate steroid effects on wound healing. ID- zosyn 2/24>>3/1, zosyn 3/2>> Foley - replaced 3/2 for retention DVT - SCDs,heparin gtt for afib, oral agent plan as above Follow-Up- Dr. Redmond Pulling  Dispo - IR not recommending perc drain at this time, consider repeat CT scan in 72 hours to reassess fluid collections. Patient was restarted on zosyn yesterday, WBC trending down. Nausea somewhat improved this morning, will advance back to FLD and monitor. Mobilize. Working towards home with home health. Discussed with Dr. Bobbye Morton. Ok to restart eliquis. If able to tolerate diet and this can be advanced tomorrow, she could go home tomorrow from surgical standpoint on augmentin. CT scan not fully necessary unless she is not improving. Will arrange close follow up in our office.   LOS: 14 days    [facility] 01/12/2020, 9:15 AM Please see Amion for pager number during day hours 7:00am-4:30pm

## 2020-01-12 NOTE — Progress Notes (Signed)
  Request seen for abscess drain placement.  CT reviewed by Dr. Laurence Ferrari.  Very small volume fluid, configuration looks more like blood than abscess.  Repeat CT in 72 hrs.  Myquan Schaumburg S Aaleah Hirsch PA-C 01/12/2020 8:47 AM

## 2020-01-12 NOTE — Progress Notes (Signed)
ANTICOAGULATION CONSULT NOTE - Follow-Up Consult  Pharmacy Consult for Heparin >> Eliquis Indication: atrial fibrillation  Patient Measurements: Height: 5\' 6"  (167.6 cm) Weight: 164 lb 14.5 oz (74.8 kg) IBW/kg (Calculated) : 59.3 Heparin Dosing Weight: 75 kg  Vital Signs: Temp: 98.5 F (36.9 C) (03/03 0832) Temp Source: Oral (03/02 2347) BP: 124/71 (03/03 0832) Pulse Rate: 78 (03/03 0832)  Labs: Recent Labs    01/10/20 0221 01/10/20 0221 01/11/20 0416 01/11/20 1139 01/12/20 0527  HGB 9.4*   < >  --  10.9* 10.3*  HCT 29.7*  --   --  34.5* 32.9*  PLT 421*  --   --  556* 556*  HEPARINUNFRC 0.31  --  0.57  --   --   CREATININE 0.72  --   --   --  0.88   < > = values in this interval not displayed.    Estimated Creatinine Clearance: 40.4 mL/min (by C-G formula based on SCr of 0.88 mg/dL).   Medical History: Past Medical History:  Diagnosis Date  . DVT (deep venous thrombosis) ([place]) 09/2015   RLE  . GERD (gastroesophageal reflux disease)   . Hypertension   . Hypothyroidism   . 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 Peripheral vascular disease ([place])    peripheral neuropathy    Assessment: 86 yo female with new onset Afib (CHADS2VASc = 4) transitioned to heparin gtt, now back to Eliquis.  Goal of Therapy:  Heparin level 0.3-0.7 units/ml aPTT 66-102 seconds Monitor platelets by anticoagulation protocol: Yes   Plan:  D/c heparin gtt Start Eliquis 5mg  PO BID  Bertis Ruddy, PharmD Clinical Pharmacist Please check AMION for all [facility] numbers 01/12/2020 11:11 AM

## 2020-01-12 NOTE — Progress Notes (Signed)
Physical Therapy Treatment Patient Details Name: [name] MRN: [id number] DOB: 15-Mar-1923 Today's Date: 01/12/2020    History of Present Illness 86 yo female presenting to [facility] for severe left wrist pain after fall.  Pt reports that she had been experiencing upset stomach and diarrhea for a few days, had been lightheaded upon standing, had a fall without any significant appreciable injury yesterday, and then fell again this morning onto her left arm. Xrays show radial fx and left lateral 8th and 9th rib fxs (possibly the 7th as well). Pt underwent colostomy on 2/25.  PMH including is a HTN, hypothyroidism, polymyalgia rheumatica, and chronic kidney disease III.    PT Comments    Pt agreeable to short distance ambulation in room, but continues to be very limited by nausea. Pt requires min assist for all mobility today for steadying, navigating use of RW, and safety. PT to continue to follow acutely, progressing slowly with mobility vs yesterday.    Follow Up Recommendations  CIR;Supervision/Assistance - 24 hour     Equipment Recommendations  Rolling walker with 5" wheels;3in1 (PT);Hospital bed;Wheelchair (measurements PT);Wheelchair cushion (measurements PT)(18x18 standard WC, left platform attachment for RW)    Recommendations for Other Services       Precautions / Restrictions Precautions Precautions: Fall;Other (comment) Precaution Comments: L side ostomy bag, check befor mobilizing Restrictions LUE Weight Bearing: Non weight bearing Other Position/Activity Restrictions: may use platform walker    Mobility  Bed Mobility Overal bed mobility: Needs Assistance Bed Mobility: Sit to Sidelying;Rolling Rolling: Min assist       Sit to sidelying: Min assist General bed mobility comments: Min assist for return to supine for cuing for log roll technique, LE lifting into bed, and repositioning once in supine. Pt up in chair upon PT arrival to room.  Transfers Overall transfer  level: Needs assistance Equipment used: Left platform walker Transfers: Sit to/from Stand Sit to Stand: Min assist         General transfer comment: Min assist to power up from recliner, hand placement when rising and once standing. Pt holding RW crossbar with RUE, encouraged to place hand on handrest before placing LUE on forearm rest.  Ambulation/Gait Ambulation/Gait assistance: Min assist Gait Distance (Feet): 10 Feet Assistive device: Left platform walker Gait Pattern/deviations: Step-through pattern;Drifts right/left;Decreased step length - left;Decreased dorsiflexion - left;Narrow base of support;Trunk flexed;Decreased stride length Gait velocity: decr   General Gait Details: Min assist for steadying, guiding pt and RW. Verbal cuing for correct placement of self in RW, placement of LUE.   Stairs             Wheelchair Mobility    Modified Rankin (Stroke Patients Only)       Balance Overall balance assessment: Needs assistance Sitting-balance support: Feet supported;Bilateral upper extremity supported Sitting balance-Leahy Scale: Fair     Standing balance support: Bilateral upper extremity supported Standing balance-Leahy Scale: Poor Standing balance comment: needs external assist in standing from therapist and L PFRW                            Cognition Arousal/Alertness: Awake/alert Behavior During Therapy: Flat affect Overall Cognitive Status: Within Functional Limits for tasks assessed                                 General Comments: increased time and effort to mobilize, secondary to nausea  Exercises General Exercises - Lower Extremity Ankle Circles/Pumps: AROM;Both;5 reps;Supine Heel Slides: AROM;Both;Supine;5 reps Hip ABduction/ADduction: AROM;Both;5 reps;Supine Straight Leg Raises: AAROM;Both;5 reps;Supine    General Comments        Pertinent Vitals/Pain Pain Assessment: Faces Faces Pain Scale: Hurts a  little bit Pain Location: abdomen (mostly nausea) Pain Descriptors / Indicators: Grimacing;Guarding Pain Intervention(s): Limited activity within patient's tolerance;Monitored during session;Premedicated before session;Repositioned    Home Living                      Prior Function            PT Goals (current goals can now be found in the care plan section) Acute Rehab PT Goals Patient Stated Goal: to decrease nausea PT Goal Formulation: With patient Time For Goal Achievement: 01/21/20 Potential to Achieve Goals: Good Progress towards PT goals: Progressing toward goals    Frequency    Min 5X/week      PT Plan Current plan remains appropriate    Co-evaluation              AM-PAC PT "6 Clicks" Mobility   Outcome Measure  Help needed turning from your back to your side while in a flat bed without using bedrails?: A Little Help needed moving from lying on your back to sitting on the side of a flat bed without using bedrails?: A Little Help needed moving to and from a bed to a chair (including a wheelchair)?: A Little Help needed standing up from a chair using your arms (e.g., wheelchair or bedside chair)?: A Little Help needed to walk in hospital room?: A Little Help needed climbing 3-5 steps with a railing? : A Lot 6 Click Score: 17    End of Session   Activity Tolerance: Other (comment);Patient limited by fatigue(nausea) Patient left: with call bell/phone within reach;in bed;with bed alarm set Nurse Communication: Mobility status PT Visit Diagnosis: Unsteadiness on feet (R26.81);Other abnormalities of gait and mobility (R26.89);Muscle weakness (generalized) (M62.81);Difficulty in walking, not elsewhere classified (R26.2);Repeated falls (R29.6);Pain Pain - Right/Left: Left Pain - part of body: Arm     Time: 1300-1318 PT Time Calculation (min) (ACUTE ONLY): 18 min  Charges:  $Therapeutic Activity: 8-22 mins                     Rayhana Slider E, PT Acute  Rehabilitation Services Pager [phone]  Office [phone]    Katheline Brendlinger D Aniah Pauli 01/12/2020, 2:30 PM

## 2020-01-12 NOTE — Progress Notes (Signed)
Pt's son called our office today with some questions regarding pt's overall plan and discharge.   I spent about 18 min on the phone with pt's son and other family members.  Summarized events of hospitalization to date  Discussed why no drain placed into fluid collections. Discussed that its common to have some fluid in the abdomen after this type of surgical event and intraoperative findings.  i'm ok with holding off on drain placement and following clinically. If spikes temperature - would consider repeat ct.   They had questions about the blood thinner.  Told them that generally patients are put on a blood thinner after going into an irregular heartbeat to prevent risk of stroke/clots.  Advised him that they would need to discuss whether or not the patient needs to stay on the blood thinner long-term with her PCP and cardiologist because if she is a high fall risk then the risk may outweigh the benefits but that conversation can happen later  Discussed the general progression from inpatient stay to discharge and that generally case management and the medical and surgical team start preparing patient's and family a few days before an anticipated discharge in getting things lined up even though does not guarantee discharge within 24 hours  Advised him that it appears insurance is still evaluating the patient for CIR  They are concerned about her poor appetite.  They state that she is really not drinking the protein shakes because she does not like the taste of it.  Also that she is not eating much  Discussed is not uncommon for postoperative patients at her age to have issues with appetite, could be SE from meds as well but we want to make sure she is eating some  I will have her PA see which protein shakes she has tried and see if there are some alternative ones - perhaps Premier protein shake or unjury protein shake - chicken soup shake.   They also state she still has foley and has had urinary  retention. Discussed that sometimes postop urinary retention happens. If has failed several voiding trials, foley cath generally replaced. - will ask PA to investigate a little further and see if on meds  We'll also start a calorie count  Leighton Ruff. Redmond Pulling, MD, FACS General, Bariatric, & Minimally Invasive Surgery [facility], [place]

## 2020-01-12 NOTE — TOC Benefit Eligibility Note (Signed)
Transition of Care [facility]) Benefit Eligibility Note    Patient Details  Name: [name] MRN: [id number] Date of Birth: February 12, 1923   Medication/Dose: [name]  5 MG BID  Covered?: Yes  Tier: 2 Drug  Prescription Coverage Preferred Pharmacy: [facility]  Spoke with Person/Company/Phone Number:: [place]   @ [employer] # [phone]  Co-Pay: $ 40.00  Prior Approval: No  Deductible: Met(INITIAL COVERAGE STAGE TWO)       Memory Argue Phone Number: 01/12/2020, 8:57 AM

## 2020-01-12 NOTE — Progress Notes (Signed)
Inpatient Rehab Admissions Coordinator:   Awaiting determination from insurance.   Shann Medal, PT, DPT Admissions Coordinator [phone] 01/12/20  1:07 PM

## 2020-01-12 NOTE — Progress Notes (Signed)
PROGRESS NOTE    [name]  [id number] DOB: 1923-07-24 DOA: 12/29/2019 PCP: [name], MD   Brief Narrative:  86 year old with HTN, hypothyroidism, polymyalgia rheumatica, CKD stage IIIa presented from [place] for evaluation of severe left wrist pain after a fall.  Patient was having abdominal pain with diarrhea as well.  Upon admission was found to have acute kidney injury, distal radius fracture and left lateral rib fractures.  Underwent ORIF by orthopedics for the left radial wrist fracture but due to worsening of abdominal pain she had a CT of the abdomen pelvis showing perforated sigmoid diverticulitis with pneumoperitoneum.  She underwent expiratory laparotomy, sigmoid colectomy with end colostomy by Dr. Redmond Pulling on 01/06/2020.  Hospital course also complicated by atrial fibrillation with RVR therefore cardiology team consulted also started on Eliquis.   Assessment & Plan:   Principal Problem:   Acute renal failure superimposed on stage 3a chronic kidney disease (HCC) Active Problems:   Hypothyroidism   HTN (hypertension)   Preoperative cardiovascular examination   Closed fracture of multiple ribs   Closed fracture of left distal radius   Recurrent falls  Perforated sigmoid colon with pneumoperitoneum status post expiratory laparotomy, sigmoid colostomy with and pneumoperitoneum 2/25 -Postop management per surgery team.  Twice wet-to-dry dressing. -Diet as tolerated.  5 more days of oral Augmentin, last day 01/17/2020.  Discontinue Zosyn -Repeat CT from yesterday, no drainable abscess at this time.  Possible small amount of blood.  Repeat scan in 3 days. -Advance diet per general surgery-no need for intervention, 5 days of Augmentin and repeat scan in about 72 hours. -Still has leukocytosis but afebrile.  Atrial fibrillation with RVR, paroxysmal -Rate is controlled.  Heparin drip, transition to Eliquis once cleared by general surgery and tolerating  orals -Amiodarone 200 mg p.o. daily  Distal left radial fracture Lateral left rib fractures -Status post ORIF.  Nonweightbearing of the left upper extremity and follow-up outpatient with orthopedic. -PT/OT recommending CIR. -Rib fractures continue pain management.  Incentive spirometer.  Dizziness and fall, resolved -Suspect from dehydration.  Echocardiogram EF 50%, grade 1 DD. -Currently asymptomatic.  Polymyalgia rheumatica -Daily prednisone 10 mg p.o. daily  Essential hypertension -Norvasc 10 mg p.o. daily on hold, blood pressure stable.  Hypothyroidism -P.o. Synthroid on hold, will order IV.  Normocytic anemia -Hemoglobin stable  Acute urinary retention requiring Foley catheter placement.   DVT prophylaxis: Eliquis Code Status: Full code Family Communication: None Disposition Plan:   Patient From= home  Patient Anticipated D/C place= CIR versus home health  Barriers= awaiting clearance by surgery, thereafter transition to CIR versus home health.  TOC team working with family  Consultants:   General surgery  Cardiology   Subjective: Tells me she does not feel nauseous today and feels slightly better.  No complaints of the moment.  Review of Systems Otherwise negative except as per HPI, including: General: Denies fever, chills, night sweats or unintended weight loss. Resp: Denies cough, wheezing, shortness of breath. Cardiac: Denies chest pain, palpitations, orthopnea, paroxysmal nocturnal dyspnea. GI: Denies abdominal pain, nausea, vomiting, diarrhea or constipation GU: Denies dysuria, frequency, hesitancy or incontinence MS: Denies muscle aches, joint pain or swelling Neuro: Denies headache, neurologic deficits (focal weakness, numbness, tingling), abnormal gait Psych: Denies anxiety, depression, SI/HI/AVH Skin: Denies new rashes or lesions ID: Denies sick contacts, exotic exposures, travel  Examination:  General exam: Appears calm and comfortable   Respiratory system: Clear to auscultation. Respiratory effort normal. Cardiovascular system: S1 & S2 heard,  RRR. No JVD, murmurs, rubs, gallops or clicks. No pedal edema. Gastrointestinal system: Surgical site on the abdomen noted.  Positive bowel sounds. Central nervous system: Alert and oriented. No focal neurological deficits. Extremities: Symmetric 5 x 5 power. Skin: No rashes, lesions or ulcers Psychiatry: Judgement and insight appear normal. Mood & affect appropriate.   Right upper quadrant ostomy bag Foley catheter noted.  Objective: Vitals:   01/11/20 1934 01/11/20 2049 01/11/20 2347 01/12/20 0610  BP: (!) 134/93 119/71 113/76 140/73  Pulse: 93 93 93   Resp: 18     Temp: 98 F (36.7 C) 98 F (36.7 C) 98.4 F (36.9 C)   TempSrc: Oral Oral Oral   SpO2: 95% 95% 95% 96%  Weight:      Height:        Intake/Output Summary (Last 24 hours) at 01/12/2020 0748 Last data filed at 01/12/2020 0307 Gross per 24 hour  Intake 956.41 ml  Output 4360 ml  Net -3403.59 ml   Filed Weights   12/31/19 2115 01/04/20 2047 01/05/20 2057  Weight: 71.7 kg 70.8 kg 74.8 kg     Data Reviewed:   CBC: Recent Labs  Lab 01/07/20 0202 01/07/20 0202 01/08/20 0553 01/09/20 0531 01/10/20 0221 01/11/20 1139 01/12/20 0527  WBC 13.8*   < > 16.1* 13.6* 13.2* 20.3* 17.0*  NEUTROABS 12.3*  --  13.5* 9.2* 8.1*  --  11.1*  HGB 9.3*   < > 9.2* 9.8* 9.4* 10.9* 10.3*  HCT 29.3*   < > 29.3* 31.8* 29.7* 34.5* 32.9*  MCV 95.4   < > 95.8 97.5 95.2 95.0 95.9  PLT 305   < > 336 406* 421* 556* 556*   < > = values in this interval not displayed.   Basic Metabolic Panel: Recent Labs  Lab 01/06/20 0454 01/07/20 0202 01/08/20 0553 01/10/20 0221  NA 139 141 142 140  K 4.3 4.1 3.7 3.7  CL 102 107 110 103  CO2 27 22 23 25   GLUCOSE 104* 89 109* 87  BUN 21 18 22 14   CREATININE 1.44* 1.06* 0.95 0.72  CALCIUM 8.4* 8.1* 8.6* 8.5*   GFR: Estimated Creatinine Clearance: 44.5 mL/min (by C-G formula based  on SCr of 0.72 mg/dL). Liver Function Tests: Recent Labs  Lab 01/06/20 0454  AST 14*  ALT 13  ALKPHOS 64  BILITOT 1.6*  PROT 4.9*  ALBUMIN 2.1*   No results for input(s): LIPASE, AMYLASE in the last 168 hours. No results for input(s): AMMONIA in the last 168 hours. Coagulation Profile: Recent Labs  Lab 01/06/20 0454  INR 1.2   Cardiac Enzymes: No results for input(s): CKTOTAL, CKMB, CKMBINDEX, TROPONINI in the last 168 hours. BNP (last 3 results) No results for input(s): PROBNP in the last 8760 hours. HbA1C: No results for input(s): HGBA1C in the last 72 hours. CBG: Recent Labs  Lab 01/06/20 1535 01/06/20 1911 01/08/20 0624 01/08/20 2014  GLUCAP 87 88 96 121*   Lipid Profile: No results for input(s): CHOL, HDL, LDLCALC, TRIG, CHOLHDL, LDLDIRECT in the last 72 hours. Thyroid Function Tests: No results for input(s): TSH, T4TOTAL, FREET4, T3FREE, THYROIDAB in the last 72 hours. Anemia Panel: No results for input(s): VITAMINB12, FOLATE, FERRITIN, TIBC, IRON, RETICCTPCT in the last 72 hours. Sepsis Labs: Recent Labs  Lab 01/07/20 0202  LATICACIDVEN 0.8    Recent Results (from the past 240 hour(s))  Culture, blood (routine x 2)     Status: None (Preliminary result)   Collection Time: 01/08/20  8:11  AM   Specimen: BLOOD LEFT HAND  Result Value Ref Range Status   Specimen Description BLOOD LEFT HAND  Final   Special Requests   Final    BOTTLES DRAWN AEROBIC ONLY Blood Culture results may not be optimal due to an inadequate volume of blood received in culture bottles   Culture   Final    NO GROWTH 3 DAYS Performed at [facility], [place] [address] [place], [place] [address]    Report Status PENDING  Incomplete  Culture, blood (routine x 2)     Status: None (Preliminary result)   Collection Time: 01/08/20  8:12 AM   Specimen: BLOOD LEFT FOREARM  Result Value Ref Range Status   Specimen Description BLOOD LEFT FOREARM  Final   Special Requests   Final     BOTTLES DRAWN AEROBIC ONLY Blood Culture results may not be optimal due to an inadequate volume of blood received in culture bottles   Culture   Final    NO GROWTH 3 DAYS Performed at [facility], [place] [address], [place], [place] [address]    Report Status PENDING  Incomplete         Radiology Studies: CT ABDOMEN PELVIS W CONTRAST  Result Date: 01/11/2020 CLINICAL DATA:  Intra-abdominal abscess EXAM: CT ABDOMEN AND PELVIS WITH CONTRAST TECHNIQUE: Multidetector CT imaging of the abdomen and pelvis was performed using the standard protocol following bolus administration of intravenous contrast. CONTRAST:  113mL OMNIPAQUE IOHEXOL 300 MG/ML  SOLN COMPARISON:  CT 01/05/2020, 03/04/2016 FINDINGS: Lower chest: Lung bases demonstrate interim development of small pleural effusions. Partial consolidation left base likely atelectasis. Cardiomegaly with coronary vascular calcification. Hepatobiliary: Chronic wedge shaped hypoenhancing region in the peripheral right hepatic lobe. Gallstones. No biliary dilatation Pancreas: Fatty atrophy.  No inflammatory changes Spleen: Normal in size without focal abnormality. Adrenals/Urinary Tract: Adrenal glands are within normal limits. Cyst within the bilateral kidneys. Small amount of air in the urinary bladder. Stomach/Bowel: The stomach is nonenlarged. No dilated small bowel. Interval sigmoid colectomy with creation of left lower quadrant colostomy. Mild wall thickening and inflammatory change involving the left lower quadrant ostomy. Vascular/Lymphatic: Moderate aortic atherosclerosis. No aneurysm. No significant adenopathy. Retroaortic left renal vein. Reproductive: Status post hysterectomy. No adnexal masses. Other: Resolution of previously noted free air. Interval ventral wound. Fluid in the bilateral lower quadrants. Left lower quadrant fluid collection demonstrates mild rim enhancement. This measures 6.1 by 1.8 by 4.7 cm. Small rim enhancing fluid collection  in the right posterior pelvis measures 3 by 1.5 by 1.6 cm, series 3, image number 64. Fluid in the right lower quadrant without discernible rim enhancement at this time. Musculoskeletal: Displaced left ninth posterolateral rib fracture. Moderate wedge compression deformity at T11. Advanced degenerative changes of the lumbar spine. IMPRESSION: 1. Interval sigmoid colectomy with creation of left lower quadrant colostomy. Mildly rim enhancing fluid collections within the left lower quadrant and right posterior pelvis, suspect for potential developing abscess. Small amount of fluid in the right lower quadrant as well though without appreciable rim enhancement at this time. Resolution of previously noted free air. 2. Slight thickened appearance of the left lower quadrant colostomy with surrounding soft tissue stranding, possibly due to postoperative edema and inflammation. 3. New small pleural effusions with atelectasis at the left base. 4. Gallstones Electronically Signed   By: Donavan Foil M.D.   On: 01/11/2020 17:57   DG Chest Port 1 View  Result Date: 01/11/2020 CLINICAL DATA:  Nausea EXAM: PORTABLE  CHEST 1 VIEW COMPARISON:  12/30/2019 FINDINGS: Cardiomegaly. Left base atelectasis and small left pleural effusion. Right lung clear. Left rib fractures and remote left humeral neck fracture again noted. IMPRESSION: Left base atelectasis, small left effusion. Electronically Signed   By: Rolm Baptise M.D.   On: 01/11/2020 10:07   DG Abd 2 Views  Result Date: 01/11/2020 CLINICAL DATA:  Abdominal pain with nausea this morning. EXAM: ABDOMEN - 2 VIEW COMPARISON:  Abdominal CT 01/05/2020. FINDINGS: The bowel gas pattern is nonobstructive. There is no free intraperitoneal air or bowel wall thickening. There are small left-greater-than-right pleural effusions with associated left lower lobe atelectasis. No suspicious abdominal calcifications. Degenerative changes throughout the spine, a convex right lumbar scoliosis and  chronic compression deformity at T11 are unchanged. IMPRESSION: No acute abdominal findings. Small pleural effusions and left lower lobe atelectasis. Electronically Signed   By: Richardean Sale M.D.   On: 01/11/2020 13:54        Scheduled Meds: . acetaminophen  500 mg Oral Q6H  . amiodarone  200 mg Oral Daily  . bupivacaine liposome  20 mL Infiltration Once  . Chlorhexidine Gluconate Cloth  6 each Topical Daily  . feeding supplement  1 Container Oral BID BM  . metoprolol tartrate  5 mg Intravenous Q6H  . pantoprazole (PROTONIX) IV  40 mg Intravenous Daily  . polyethylene glycol  17 g Oral Daily  . predniSONE  10 mg Oral Q breakfast  . saccharomyces boulardii  250 mg Oral BID  . sodium chloride flush  3 mL Intravenous Q12H  . vitamin A  10,000 Units Oral Daily   Continuous Infusions: . heparin 1,150 Units/hr (01/12/20 0226)  . piperacillin-tazobactam (ZOSYN)  IV 3.375 g (01/12/20 0415)     LOS: 14 days   Time spent= 35 mins    Neriyah Cercone Arsenio Loader, MD [facility]  If 7PM-7AM, please contact night-coverage  01/12/2020, 7:48 AM

## 2020-01-12 NOTE — Care Management (Addendum)
Benefit check sent for Eliquis. Conformed patient has card. Spoke w patient at bedside to discuss options if CIR is not approved. She was not able to provide decision about [facility] provider, reiterated that she does not want SNF.

## 2020-01-12 NOTE — Consult Note (Signed)
Ref: [name], MD   Subjective:  Abdominal pain and nausea are sightly improved per patient.  Afebrile. Monitor: A. Fib with HR in 90's.   Objective:  Vital Signs in the last 24 hours: Temp:  [98 F (36.7 C)-98.5 F (36.9 C)] 98.5 F (36.9 C) (03/03 0832) Pulse Rate:  [78-96] 96 (03/03 1110) Cardiac Rhythm: Atrial fibrillation (03/03 0700) Resp:  [16-18] 16 (03/03 0832) BP: (113-140)/(71-93) 132/83 (03/03 1110) SpO2:  [94 %-96 %] 94 % (03/03 0832)  Physical Exam: BP Readings from Last 1 Encounters:  01/12/20 132/83     Wt Readings from Last 1 Encounters:  01/05/20 74.8 kg    Weight change:  Body mass index is 26.62 kg/m. HEENT: [place]/AT, Eyes-Blue, PERL, EOMI, Conjunctiva-Pale pink, Sclera-Non-icteric Neck: No JVD, No bruit, Trachea midline. Lungs:  Clear, Bilateral. Cardiac:  Regular rhythm, normal S1 and S2, no S3. II/VI systolic murmur. Abdomen:  Soft, epigastric and pelvic area-tender. BS present. Extremities:  No edema present. No cyanosis. No clubbing. CNS: AxOx3, Cranial nerves grossly intact, moves all 4 extremities.  Skin: Warm and dry.   Intake/Output from previous day: 03/02 0701 - 03/03 0700 In: 956.4 [P.O.:795; I.V.:111.4; IV Piggyback:50] Out: 4360 [Urine:3860; Stool:500]    Lab Results: BMET    Component Value Date/Time   NA 140 01/12/2020 0527   NA 140 01/10/2020 0221   NA 142 01/08/2020 0553   K 3.5 01/12/2020 0527   K 3.7 01/10/2020 0221   K 3.7 01/08/2020 0553   CL 104 01/12/2020 0527   CL 103 01/10/2020 0221   CL 110 01/08/2020 0553   CO2 25 01/12/2020 0527   CO2 25 01/10/2020 0221   CO2 23 01/08/2020 0553   GLUCOSE 84 01/12/2020 0527   GLUCOSE 87 01/10/2020 0221   GLUCOSE 109 (H) 01/08/2020 0553   BUN 11 01/12/2020 0527   BUN 14 01/10/2020 0221   BUN 22 01/08/2020 0553   CREATININE 0.88 01/12/2020 0527   CREATININE 0.72 01/10/2020 0221   CREATININE 0.95 01/08/2020 0553   CREATININE 0.95 (H) 01/26/2016 1552   CALCIUM  8.7 (L) 01/12/2020 0527   CALCIUM 8.5 (L) 01/10/2020 0221   CALCIUM 8.6 (L) 01/08/2020 0553   GFRNONAA 56 (L) 01/12/2020 0527   GFRNONAA >60 01/10/2020 0221   GFRNONAA 51 (L) 01/08/2020 0553   GFRAA >60 01/12/2020 0527   GFRAA >60 01/10/2020 0221   GFRAA 59 (L) 01/08/2020 0553   CBC    Component Value Date/Time   WBC 17.0 (H) 01/12/2020 0527   RBC 3.43 (L) 01/12/2020 0527   HGB 10.3 (L) 01/12/2020 0527   HCT 32.9 (L) 01/12/2020 0527   PLT 556 (H) 01/12/2020 0527   MCV 95.9 01/12/2020 0527   MCH 30.0 01/12/2020 0527   MCHC 31.3 01/12/2020 0527   RDW 15.3 01/12/2020 0527   LYMPHSABS 3.0 01/12/2020 0527   MONOABS 1.1 (H) 01/12/2020 0527   EOSABS 0.3 01/12/2020 0527   BASOSABS 0.1 01/12/2020 0527   HEPATIC Function Panel Recent Labs    06/22/19 1112 01/06/20 0454  PROT 6.6 4.9*   HEMOGLOBIN A1C No components found for: HGA1C,  MPG CARDIAC ENZYMES Lab Results  Component Value Date   TROPONINI <0.03 03/04/2016   BNP No results for input(s): PROBNP in the last 8760 hours. TSH Recent Labs    06/22/19 1112 01/07/20 1036  TSH 1.42 0.187*   CHOLESTEROL Recent Labs    06/22/19 1112  CHOL 190    Scheduled Meds: . acetaminophen  500 mg Oral Q6H  . amiodarone  200 mg Oral Daily  . amoxicillin-clavulanate  1 tablet Oral Q12H  . apixaban  5 mg Oral BID  . bupivacaine liposome  20 mL Infiltration Once  . Chlorhexidine Gluconate Cloth  6 each Topical Daily  . feeding supplement  1 Container Oral BID BM  . levothyroxine  25 mcg Intravenous Daily  . metoprolol tartrate  5 mg Intravenous Q6H  . pantoprazole (PROTONIX) IV  40 mg Intravenous Daily  . polyethylene glycol  17 g Oral Daily  . predniSONE  10 mg Oral Q breakfast  . saccharomyces boulardii  250 mg Oral BID  . sodium chloride flush  3 mL Intravenous Q12H  . vitamin A  10,000 Units Oral Daily   Continuous Infusions: PRN Meds:.diltiazem, morphine injection, ondansetron (ZOFRAN) IV, oxyCODONE, polyethylene  glycol, promethazine, senna-docusate  Assessment/Plan: Atrial fibrillation, chronic Acute perforated diverticulitis with peritonitis Abdominal pain Possible intra-abdominal hematoma/abscess S/P Hartmann's procedure Anemia of blood loss HTN Hypothyroidism Polymyalgia Rheumatica  Continue IV metoprolol Continue apixaban   LOS: 14 days   Time spent including chart review, lab review, examination, discussion with patient and nurse : 25 min   Dixie Dials  MD  01/12/2020, 1:48 PM

## 2020-01-12 NOTE — Discharge Instructions (Signed)
Fall Prevention in the Home, Adult Falls can cause injuries. They can happen to people of all ages. There are many things you can do to make your home safe and to help prevent falls. Ask for help when making these changes, if needed. What actions can I take to prevent falls? General Instructions  Use good lighting in all rooms. Replace any light bulbs that burn out.  Turn on the lights when you go into a dark area. Use night-lights.  Keep items that you use often in easy-to-reach places. Lower the shelves around your home if necessary.  Set up your furniture so you have a clear path. Avoid moving your furniture around.  Do not have throw rugs and other things on the floor that can make you trip.  Avoid walking on wet floors.  If any of your floors are uneven, fix them.  Add color or contrast paint or tape to clearly mark and help you see: ? Any grab bars or handrails. ? First and last steps of stairways. ? Where the edge of each step is.  If you use a stepladder: ? Make sure that it is fully opened. Do not climb a closed stepladder. ? Make sure that both sides of the stepladder are locked into place. ? Ask someone to hold the stepladder for you while you use it.  If there are any pets around you, be aware of where they are. What can I do in the bathroom?      Keep the floor dry. Clean up any water that spills onto the floor as soon as it happens.  Remove soap buildup in the tub or shower regularly.  Use non-skid mats or decals on the floor of the tub or shower.  Attach bath mats securely with double-sided, non-slip rug tape.  If you need to sit down in the shower, use a plastic, non-slip stool.  Install grab bars by the toilet and in the tub and shower. Do not use towel bars as grab bars. What can I do in the bedroom?  Make sure that you have a light by your bed that is easy to reach.  Do not use any sheets or blankets that are too big for your bed. They should not  hang down onto the floor.  Have a firm chair that has side arms. You can use this for support while you get dressed. What can I do in the kitchen?  Clean up any spills right away.  If you need to reach something above you, use a strong step stool that has a grab bar.  Keep electrical cords out of the way.  Do not use floor polish or wax that makes floors slippery. If you must use wax, use non-skid floor wax. What can I do with my stairs?  Do not leave any items on the stairs.  Make sure that you have a light switch at the top of the stairs and the bottom of the stairs. If you do not have them, ask someone to add them for you.  Make sure that there are handrails on both sides of the stairs, and use them. Fix handrails that are broken or loose. Make sure that handrails are as long as the stairways.  Install non-slip stair treads on all stairs in your home.  Avoid having throw rugs at the top or bottom of the stairs. If you do have throw rugs, attach them to the floor with carpet tape.  Choose a carpet that does   not hide the edge of the steps on the stairway.  Check any carpeting to make sure that it is firmly attached to the stairs. Fix any carpet that is loose or worn. What can I do on the outside of my home?  Use bright outdoor lighting.  Regularly fix the edges of walkways and driveways and fix any cracks.  Remove anything that might make you trip as you walk through a door, such as a raised step or threshold.  Trim any bushes or trees on the path to your home.  Regularly check to see if handrails are loose or broken. Make sure that both sides of any steps have handrails.  Install guardrails along the edges of any raised decks and porches.  Clear walking paths of anything that might make someone trip, such as tools or rocks.  Have any leaves, snow, or ice cleared regularly.  Use sand or salt on walking paths during winter.  Clean up any spills in your garage right  away. This includes grease or oil spills. What other actions can I take?  Wear shoes that: ? Have a low heel. Do not wear high heels. ? Have rubber bottoms. ? Are comfortable and fit you well. ? Are closed at the toe. Do not wear open-toe sandals.  Use tools that help you move around (mobility aids) if they are needed. These include: ? Canes. ? Walkers. ? Scooters. ? Crutches.  Review your medicines with your doctor. Some medicines can make you feel dizzy. This can increase your chance of falling. Ask your doctor what other things you can do to help prevent falls. Where to find more information  Centers for Disease Control and Prevention, [facility]: https://garcia.biz/  [employer] on Aging: BrainJudge.co.uk Contact a doctor if:  You are afraid of falling at home.  You feel weak, drowsy, or dizzy at home.  You fall at home. Summary  There are many simple things that you can do to make your home safe and to help prevent falls.  Ways to make your home safe include removing tripping hazards and installing grab bars in the bathroom.  Ask for help when making these changes in your home. This information is not intended to replace advice given to you by your health care provider. Make sure you discuss any questions you have with your health care provider. Document Revised: 02/18/2019 Document Reviewed: 06/12/2017 Elsevier Patient Education  2020 [facility], [place] [phone]  OPEN ABDOMINAL SURGERY: POST OP INSTRUCTIONS  Always review your discharge instruction sheet given to you by the facility where your surgery was performed.  IF YOU HAVE DISABILITY OR FAMILY LEAVE FORMS, YOU MUST BRING THEM TO THE OFFICE FOR PROCESSING.  PLEASE DO NOT GIVE THEM TO YOUR DOCTOR.  1. A prescription for pain medication may be given to you upon discharge.  Take your pain medication as prescribed, if needed.  If narcotic pain medicine is not  needed, then you may take acetaminophen (Tylenol) or ibuprofen (Advil) as needed. 2. Take your usually prescribed medications unless otherwise directed. 3. If you need a refill on your pain medication, please contact your pharmacy. They will contact our office to request authorization.  Prescriptions will not be filled after 5pm or on week-ends. 4. You should follow a light diet the first few days after arrival home, such as soup and crackers, pudding, etc.unless your doctor has advised otherwise. A high-fiber, low fat diet can be resumed  as tolerated.   Be sure to include lots of fluids daily. Most patients will experience some swelling and bruising on the chest and neck area.  Ice packs will help.  Swelling and bruising can take several days to resolve 5. Most patients will experience some swelling and bruising in the area of the incision. Ice pack will help. Swelling and bruising can take several days to resolve..  6. It is common to experience some constipation if taking pain medication after surgery.  Increasing fluid intake and taking a stool softener will usually help or prevent this problem from occurring.  A mild laxative (Milk of Magnesia or Miralax) should be taken according to package directions if there are no bowel movements after 48 hours. 7. ACTIVITIES:  You may resume regular (light) daily activities beginning the next day--such as daily self-care, walking, climbing stairs--gradually increasing activities as tolerated.  You may have sexual intercourse when it is comfortable.  Refrain from any heavy lifting or straining until approved by your doctor. a. You may drive when you no longer are taking prescription pain medication, you can comfortably wear a seatbelt, and you can safely maneuver your car and apply brakes  8. You should see your doctor in the office for a follow-up appointment approximately two weeks after your surgery.  Make sure that you call for this appointment within a day or  two after you arrive home to insure a convenient appointment time.   WHEN TO CALL YOUR DOCTOR: 1. Fever over 101.0 2. Inability to urinate 3. Nausea and/or vomiting 4. Extreme swelling or bruising 5. Continued bleeding from incision. 6. Increased pain, redness, or drainage from the incision. 7. Difficulty swallowing or breathing 8. Muscle cramping or spasms. 9. Numbness or tingling in hands or feet or around lips.  The clinic staff is available to answer your questions during regular business hours.  Please don't hesitate to call and ask to speak to one of the nurses if you have concerns.  For further questions, please visit [email or web address]    Wet to Dry WOUND CARE: - Change dressing twice daily - Supplies: sterile saline, kerlex, scissors, ABD pads, tape  1. Remove dressing and all packing carefully, moistening with sterile saline as needed to avoid packing/internal dressing sticking to the wound. 2.   Clean edges of skin around the wound with water/gauze, making sure there is no tape debris or leakage left on skin that could cause skin irritation or breakdown. 3.   Dampen and clean kerlex with sterile saline and pack wound from wound base to skin level, making sure to take note of any possible areas of wound tracking, tunneling and packing appropriately. Wound can be packed loosely. Trim kerlex to size if a whole kerlex is not required. 4.   Cover wound with a dry ABD pad and secure with tape.  5.   Write the date/time on the dry dressing/tape to better track when the last dressing change occurred. - apply any skin protectant/powder if recommended by clinician to protect skin/skin folds. - change dressing as needed if leakage occurs, wound gets contaminated, or patient requests to shower. - You may shower daily with wound open and following the shower the wound should be dried and a clean dressing placed.  - Medical grade tape as well as packing supplies can be found  at [facility] on [address]. The remaining supplies can be found at your local drug store, [employer] etc.    Colostomy Home Guide, Adult  Colostomy  surgery is done to create an opening in the front of the abdomen for stool (feces) to leave the body through an ostomy (stoma). Part of the large intestine is attached to the stoma. A bag, also called a pouch, is fitted over the stoma. Stool and gas will collect in the bag. After surgery, you will need to empty and change your colostomy bag as needed. You will also need to care for your stoma. How to care for the stoma Your stoma should look pink, red, and moist, like the inside of your cheek. Soon after surgery, the stoma may be swollen, but this swelling will go away within 6 weeks. To care for the stoma:  Keep the skin around the stoma clean and dry.  Use a clean, soft washcloth to gently wash the stoma and the skin around it. Clean using a circular motion, and wipe away from the stoma opening, not toward it. ? Use warm water and only use cleansers recommended by your health care provider. ? Rinse the stoma area with plain water. ? Dry the area around the stoma well.  Use stoma powder or ointment on your skin only as told by your health care provider. Do not use any other powders, gels, wipes, or creams on the skin around the stoma.  Check the stoma area every day for signs of infection. Check for: ? New or worsening redness, swelling, or pain. ? New or increased fluid or blood. ? Pus or warmth.  Measure the stoma opening regularly and record the size. Watch for changes. (It is normal for the stoma to get smaller as swelling goes away.) Share this information with your health care provider. How to empty the colostomy bag  Empty your bag at bedtime and whenever it is one-third to one-half full. Do not let the bag get more than half-full with stool or gas. The bag could leak if it gets too full. Some colostomy bags have a built-in gas  release valve that releases gas often throughout the day. Follow these basic steps: 1. Wash your hands with soap and water. 2. Sit far back on the toilet seat. 3. Put several pieces of toilet paper into the toilet water. This will prevent splashing as you empty stool into the toilet. 4. Remove the clip or the hook-and-loop fastener from the tail end of the bag. 5. Unroll the tail, then empty the stool into the toilet. 6. Clean the tail with toilet paper or a moist towelette. 7. Reroll the tail, and close it with the clip or the hook-and-loop fastener. 8. Wash your hands again. How to change the colostomy bag Change your bag every 3-4 days or as often as told by your health care provider. Also change the bag if it is leaking or separating from the skin, or if your skin around the stoma looks or feels irritated. Irritated skin may be a sign that the bag is leaking. Always have colostomy supplies with you, and follow these basic steps: 1. Wash your hands with soap and water. Have paper towels or tissues nearby to clean any discharge. 2. Remove the old bag and skin barrier. Use your fingers or a warm cloth to gently push the skin away from the barrier. 3. Clean the stoma area with water or with mild soap and water, as directed. Use water to rinse away any soap. 4. Dry the skin. You may use the cool setting on a hair dryer to do this. 5. Use a tracing pattern (template)  to cut the skin barrier to the size needed. 6. If you are using a two-piece bag, attach the bag and the skin barrier to each other. Add the barrier ring, if you use one. 7. If directed, apply stoma powder or skin barrier gel to the skin. 8. Warm the skin barrier with your hands, or blow with a hair dryer for 5-10 seconds. 9. Remove the paper from the adhesive strip of the skin barrier. 10. Press the adhesive strip onto the skin around the stoma. 11. Gently rub the skin barrier onto the skin. This creates heat that helps the barrier  to stick. 12. Apply stoma tape to the edges of the skin barrier, if desired. 38. Wash your hands again. General recommendations  Avoid wearing tight clothes or having anything press directly on your stoma or bag. Change your clothing whenever it is soiled or damp.  You may shower or bathe with the bag on or off. Do not use harsh or oily soaps or lotions. Dry the skin and bag after bathing.  Store all supplies in a cool, dry place. Do not leave supplies in extreme heat because some parts can melt or not stick as well.  Whenever you leave home, take extra clothing and an extra skin barrier and bag with you.  If your bag gets wet, you can dry it with a hair dryer on the cool setting.  To prevent odor, you may put drops of ostomy deodorizer in the bag.  If recommended by your health care provider, put ostomy lubricant inside the bag. This helps stool to slide out of the bag more easily and completely. Contact a health care provider if:  You have new or worsening redness, swelling, or pain around your stoma.  You have new or increased fluid or blood coming from your stoma.  Your stoma feels warm to the touch.  You have pus coming from your stoma.  Your stoma extends in or out farther than normal.  You need to change your bag every day.  You have a fever. Get help right away if:  Your stool is bloody.  You have nausea or you vomit.  You have trouble breathing. Summary  Measure your stoma opening regularly and record the size. Watch for changes.  Empty your bag at bedtime and whenever it is one-third to one-half full. Do not let the bag get more than half-full with stool or gas.  Change your bag every 3-4 days or as often as told by your health care provider.  Whenever you leave home, take extra clothing and an extra skin barrier and bag with you. This information is not intended to replace advice given to you by your health care provider. Make sure you discuss any questions  you have with your health care provider. Document Revised: 02/17/2019 Document Reviewed: 04/23/2017 Elsevier Patient Education  [place].

## 2020-01-13 DIAGNOSIS — E43 Unspecified severe protein-calorie malnutrition: Secondary | ICD-10-CM | POA: Insufficient documentation

## 2020-01-13 DIAGNOSIS — S2242XG Multiple fractures of ribs, left side, subsequent encounter for fracture with delayed healing: Secondary | ICD-10-CM

## 2020-01-13 LAB — BASIC METABOLIC PANEL
Anion gap: 11 (ref 5–15)
BUN: 10 mg/dL (ref 8–23)
CO2: 25 mmol/L (ref 22–32)
Calcium: 8.6 mg/dL — ABNORMAL LOW (ref 8.9–10.3)
Chloride: 106 mmol/L (ref 98–111)
Creatinine, Ser: 0.83 mg/dL (ref 0.44–1.00)
GFR calc Af Amer: >60 mL/min (ref >60)
GFR calc non Af Amer: >60 mL/min (ref >60)
Glucose, Bld: 92 mg/dL (ref 70–99)
Potassium: 3.7 mmol/L (ref 3.5–5.1)
Sodium: 142 mmol/L (ref 135–145)

## 2020-01-13 LAB — CULTURE, BLOOD (ROUTINE X 2)
Culture: NO GROWTH
Culture: NO GROWTH

## 2020-01-13 LAB — CBC
HCT: 33 % — ABNORMAL LOW (ref 36.0–46.0)
Hemoglobin: 10.6 g/dL — ABNORMAL LOW (ref 12.0–15.0)
MCH: 30.2 pg (ref 26.0–34.0)
MCHC: 32.1 g/dL (ref 30.0–36.0)
MCV: 94 fL (ref 80.0–100.0)
Platelets: 567 10*3/uL — ABNORMAL HIGH (ref 150–400)
RBC: 3.51 MIL/uL — ABNORMAL LOW (ref 3.87–5.11)
RDW: 15.5 % (ref 11.5–15.5)
WBC: 12.8 10*3/uL — ABNORMAL HIGH (ref 4.0–10.5)
nRBC: 0 % (ref 0.0–0.2)

## 2020-01-13 LAB — MAGNESIUM: Magnesium: 1.9 mg/dL (ref 1.7–2.4)

## 2020-01-13 MED ORDER — AMOXICILLIN-POT CLAVULANATE 875-125 MG PO TABS: 1.0000 | ORAL_TABLET | Freq: Two times a day (BID) | ORAL | 0 refills | Status: DC

## 2020-01-13 MED ORDER — APIXABAN 5 MG PO TABS: 5.0000 mg | ORAL_TABLET | Freq: Two times a day (BID) | ORAL | Status: DC

## 2020-01-13 MED ORDER — AMIODARONE HCL 200 MG PO TABS: 200.0000 mg | ORAL_TABLET | Freq: Every day | ORAL | Status: DC

## 2020-01-13 MED ORDER — METOPROLOL TARTRATE 25 MG PO TABS
25.0000 mg | ORAL_TABLET | Freq: Two times a day (BID) | ORAL | Status: DC
  Administered 2020-01-13 – 2020-01-14 (×3): 25 mg via ORAL
  Filled 2020-01-13 (×3): qty 1

## 2020-01-13 MED ORDER — LEVOTHYROXINE SODIUM 50 MCG PO TABS
50.0000 ug | ORAL_TABLET | Freq: Every day | ORAL | Status: DC
  Administered 2020-01-14: 50 ug via ORAL
  Filled 2020-01-13: qty 1

## 2020-01-13 MED ORDER — OXYCODONE HCL 5 MG PO TABS: 5.0000 mg | ORAL_TABLET | ORAL | 0 refills | Status: DC | PRN

## 2020-01-13 MED ORDER — PANTOPRAZOLE SODIUM 40 MG PO TBEC
40.0000 mg | DELAYED_RELEASE_TABLET | Freq: Every day | ORAL | Status: DC
  Administered 2020-01-13 – 2020-01-14 (×2): 40 mg via ORAL
  Filled 2020-01-13 (×2): qty 1

## 2020-01-13 MED ORDER — DILTIAZEM HCL ER COATED BEADS 120 MG PO CP24: 120.0000 mg | ORAL_CAPSULE | Freq: Every day | ORAL | Status: DC

## 2020-01-13 MED ORDER — DILTIAZEM HCL ER COATED BEADS 120 MG PO CP24
120.0000 mg | ORAL_CAPSULE | Freq: Every day | ORAL | Status: DC
  Administered 2020-01-13 – 2020-01-14 (×2): 120 mg via ORAL
  Filled 2020-01-13 (×2): qty 1

## 2020-01-13 NOTE — H&P (Signed)
Physical Medicine and Rehabilitation Admission H&P    Chief Complaint  Patient presents with  . Debility.    HPI: [name] is a 86 year old female with history of HTN, melanoma, peripheral neuropathy, HTN, CKD, left hip pain, PMR- new diagnosis, who was admitted on 12/29/2019 with nausea, vomiting, diarrhea, and dizziness resulting in a fall with left wrist pain.  History taken from chart review and patient.  Work-up revealed AKI on CKD with left rib and distal radius fracture proximal to prior ORIF.  Echocardiogram showed ejection fraction of 50%  with apical septal and apical anterior hypokinesis.  Dr. Angelena Form was consulted for input and felt that dizziness was due to dehydration and no further cardiac work-up needed.  She underwent removal of left distal radius hardware with internal fixation of left radial shaft fracture by Dr. Caralyn Guile on 02/20 and postop to be NWB on LUE.  She developed abdominal pain due to perorated sigmoid diverticulitis with pneumoperitoneum and required exploratory lap with sigmoid colectomy and end colostomy by Dr. Redmond Pulling on 01/06/2020.  Hospital course further complicated by urinary retention and a Foley was placed for decompression.  She developed atrial fibrillation with RVR on 02/26 and treated with BB and IV heparin  Per Dr. Doylene Canard.  Treated with IV antibiotics for gross purulent peritonitis and leukocytosis being monitored.  Follow-up CT done on 03/02 due to rise in WBC to 20.3 and showed  2 fluid collection in LLQ right posterior pelvic.   IR consulted for input and felt that small collections were due to blood rather than abscess and recommended repeat CT abdomen in 72 hours.  She was cleared to transition to Eliquis and Zosyn changed to Augmentin X 7 days as leukocytosis was resolving. CCS felt that repeat CT abdomen not needed. She has been tolerating full liquids with decrease in abdominal pain-->diet advanced to soft's today. She continues to have bouts  of A flutter and Cardizem added on 03/04. Therapy ongoing and CIR recommended due to functional decline.  Please see preadmission assessment from earlier today as well.   Review of Systems  Constitutional: Positive for malaise/fatigue.  Gastrointestinal: Positive for abdominal pain.  Musculoskeletal: Positive for joint pain and myalgias.  Neurological: Positive for focal weakness.  All other systems reviewed and are negative.    Past Medical History:  Diagnosis Date  . DVT (deep venous thrombosis) ([place]) 09/2015   RLE  . GERD (gastroesophageal reflux disease)   . Hypertension   . Hypothyroidism   . Melanoma ([place]) 06/16/2017   Facial melanoma, removed by Dr. Harvel Quale  . Peripheral vascular disease ([place])    peripheral neuropathy    Past Surgical History:  Procedure Laterality Date  . ABDOMINAL HYSTERECTOMY    . APPENDECTOMY    . BACK SURGERY    . BREAST SURGERY     left breast biopsy  . COLOSTOMY N/A 01/06/2020   Procedure: Colostomy;  Surgeon: Greer Pickerel, MD;  Location: [facility];  Service: General;  Laterality: N/A;  . EYE SURGERY     cataract  . HARDWARE REMOVAL Left 01/01/2020   Procedure: HARDWARE REMOVAL;  Surgeon: Iran Planas, MD;  Location: [facility];  Service: Orthopedics;  Laterality: Left;  . LAPAROTOMY N/A 01/06/2020   Procedure: Exploratory Laparotomy, Open Sigmoid colectomy;  Surgeon: Greer Pickerel, MD;  Location: [facility];  Service: General;  Laterality: N/A;  . LIPOMA EXCISION Left 04/25/2015   Procedure: EXCISION OF LIPOMA LEFT ARM;  Surgeon: Donnie Mesa, MD;  Location: [facility];  Service: General;  Laterality: Left;  . LUMBAR LAMINECTOMY/DECOMPRESSION MICRODISCECTOMY  11/20/2011   Procedure: LUMBAR LAMINECTOMY/DECOMPRESSION MICRODISCECTOMY;  Surgeon: Jeneen Rinks P [place];  Location: [facility];  Service: Orthopedics;  Laterality: N/A;  Decompressive Laminectomy L2 to the Sacrum (X-Ray)  . OPEN REDUCTION INTERNAL FIXATION (ORIF) DISTAL RADIAL FRACTURE Left  01/01/2020   Procedure: OPEN REDUCTION INTERNAL FIXATION (ORIF) DISTAL RADIAL FRACTURE;  Surgeon: Iran Planas, MD;  Location: [facility];  Service: Orthopedics;  Laterality: Left;  . OTHER SURGICAL HISTORY     left wrist surgery - has plate in left wrist   . OTHER SURGICAL HISTORY     right knee surgery due to torn cartilage   . TONSILLECTOMY    . WRIST FRACTURE SURGERY Left     Family History  Problem Relation Age of Onset  . Cancer Mother        BREAST  . COPD Father   . Emphysema Father   . Cancer Daughter        breast   Social History: Lives alone.   reports that she has never smoked. She has never used smokeless tobacco. She reports that she does not drink alcohol or use drugs.    Allergies:  Allergies  Allergen Reactions  . Codeine Other (See Comments)    Nightmares, imagining things  . Keflex [Cephalexin] Nausea Only    Pt ended up in ER w/ CP    Medications Prior to Admission  Medication Sig Dispense Refill  . amLODipine (NORVASC) 10 MG tablet TAKE 1 TABLET BY MOUTH EVERY DAY (Patient taking differently: Take 10 mg by mouth daily. ) 90 tablet 1  . gabapentin (NEURONTIN) 300 MG capsule TAKE 2 CAPSULES BY MOUTH AT BEDTIME (Patient taking differently: Take 600 mg by mouth at bedtime. ) 180 capsule 1  . Ibuprofen-Acetaminophen (ADVIL DUAL ACTION) 125-250 MG TABS Take 2 tablets by mouth as needed (pain).    Marland Kitchen levothyroxine (SYNTHROID) 50 MCG tablet TAKE 1 TABLET BY MOUTH EVERY DAY (Patient taking differently: Take 50 mcg by mouth daily. ) 90 tablet 1  . Multiple Vitamins-Minerals (PRESERVISION AREDS 2) CAPS Take 1 capsule by mouth daily.     . nadolol (CORGARD) 80 MG tablet TAKE 1 TABLET BY MOUTH EVERY DAY (Patient taking differently: Take 80 mg by mouth daily. ) 90 tablet 1  . pantoprazole (PROTONIX) 40 MG tablet TAKE 1 TABLET BY MOUTH EVERY DAY (Patient taking differently: Take 40 mg by mouth daily. ) 90 tablet 1  . predniSONE (DELTASONE) 5 MG tablet Take 10 mg by mouth  daily with breakfast.       Drug Regimen Review  Drug regimen was reviewed and remains appropriate with no significant issues identified  Home: Home Living Family/patient expects to be discharged to:: Private residence Living Arrangements: Alone Available Help at Discharge: Family, Available 24 hours/day Type of Home: House Home Access: Stairs to enter [employer] of Steps: 1 Entrance Stairs-Rails: None Home Layout: One level Bathroom Shower/Tub: Multimedia programmer: Handicapped height Home Equipment: Environmental consultant - 2 wheels, Grab bars - tub/shower Additional Comments: Son and daughter who lives nextdoor   Functional History: Prior Function Level of Independence: Independent Comments: ADLs, IADLs, driving. Enjoys reading and goes for walks. Was going to OP PT for LLE injury (pulled hip tendon)  Functional Status:  Mobility: Bed Mobility Overal bed mobility: Needs Assistance Bed Mobility: Supine to Sit Rolling: Min assist Sidelying to sit: Min assist Supine to sit: Supervision Sit  to sidelying: Min assist General bed mobility comments: no physical assist, HOB up Transfers Overall transfer level: Needs assistance Equipment used: Left platform walker Transfers: Sit to/from Stand Sit to Stand: Mod assist Stand pivot transfers: Min assist General transfer comment: assist to rise and steady Ambulation/Gait Ambulation/Gait assistance: Min assist Gait Distance (Feet): 10 Feet Assistive device: Left platform walker Gait Pattern/deviations: Step-through pattern, Drifts right/left, Decreased step length - left, Decreased dorsiflexion - left, Narrow base of support, Trunk flexed, Decreased stride length General Gait Details: Min assist for steadying, guiding pt and RW. Verbal cuing for correct placement of self in RW, placement of LUE. Gait velocity: decr Gait velocity interpretation: <1.31 ft/sec, indicative of household ambulator    ADL: ADL Overall ADL's  : Needs assistance/impaired Eating/Feeding: Set up, Sitting Eating/Feeding Details (indicate cue type and reason): Using one hand. Will need assistance for bilateral tasks Grooming: Oral care, Sitting, Set up, Brushing hair Grooming Details (indicate cue type and reason): seated in recliner Upper Body Bathing: Moderate assistance, Sitting Lower Body Bathing: Moderate assistance Lower Body Bathing Details (indicate cue type and reason): Pt performing peri care at sink with Min A for balance; requiring increased assistance for distal areas Upper Body Dressing : Moderate assistance, Sitting Upper Body Dressing Details (indicate cue type and reason): dressing L UE first Lower Body Dressing: Moderate assistance, Sit to/from stand Lower Body Dressing Details (indicate cue type and reason): crossed foot over opposite knee to pull up socks Toilet Transfer: Minimal assistance, Ambulation, BSC, RW Toilet Transfer Details (indicate cue type and reason): Simulated with recliner, more so for equipment and "woozy feeling" Toileting- Clothing Manipulation and Hygiene: Sitting/lateral lean, Min guard Functional mobility during ADLs: Minimal assistance, Rolling walker General ADL Comments: Pt set with ADLs in chair, however unable to progress due to continued nausea (pt is min-mod to complete functional ADLs in standing)  Cognition: Cognition Overall Cognitive Status: Within Functional Limits for tasks assessed Orientation Level: Oriented X4 Cognition Arousal/Alertness: Awake/alert Behavior During Therapy: WFL for tasks assessed/performed Overall Cognitive Status: Within Functional Limits for tasks assessed General Comments: increased time and effort to mobilize, secondary to nausea  Physical Exam: Blood pressure (!) 142/68, pulse 89, temperature 97.9 F (36.6 C), temperature source Oral, resp. rate 17, height 5\' 6"  (1.676 m), weight 74.8 kg, SpO2 95 %. Physical Exam  Vitals reviewed. Constitutional:  She appears well-developed.  Obese  HENT:  Head: Normocephalic and atraumatic.  Eyes: EOM are normal. Right eye exhibits no discharge. Left eye exhibits no discharge.  Neck: No tracheal deviation present. No thyromegaly present.  Respiratory: Effort normal. No respiratory distress.  GI: Soft. She exhibits no distension.  + Ostomy  Musculoskeletal:     Comments: Left upper extremity with edema  Neurological: She is alert.  Motor: Right upper extremity: 4/5 proximally, 5/5 handgrip  Left upper extremity: Proximally limited by cast, handgrip 4+/5 Bilateral lower extremities: Hip flexion, knee extension 4 medicine/5, ankle dorsiflexion 5/5  Skin:  Left upper extremity with dressing C/D/I  Psychiatric: She has a normal mood and affect. Her behavior is normal.    Results for orders placed or performed during the hospital encounter of 12/29/19 (from the past 48 hour(s))  Basic metabolic panel     Status: Abnormal   Collection Time: 01/13/20  1:06 AM  Result Value Ref Range   Sodium 142 135 - 145 mmol/L   Potassium 3.7 3.5 - 5.1 mmol/L   Chloride 106 98 - 111 mmol/L   CO2 25 22 -  32 mmol/L   Glucose, Bld 92 70 - 99 mg/dL    Comment: Glucose reference range applies only to samples taken after fasting for at least 8 hours.   BUN 10 8 - 23 mg/dL   Creatinine, Ser 0.83 0.44 - 1.00 mg/dL   Calcium 8.6 (L) 8.9 - 10.3 mg/dL   GFR calc non Af Amer >60 >60 mL/min   GFR calc Af Amer >60 >60 mL/min   Anion gap 11 5 - 15    Comment: Performed at [facility] [address], [place], [place] [address]  CBC     Status: Abnormal   Collection Time: 01/13/20  1:06 AM  Result Value Ref Range   WBC 12.8 (H) 4.0 - 10.5 K/uL   RBC 3.51 (L) 3.87 - 5.11 MIL/uL   Hemoglobin 10.6 (L) 12.0 - 15.0 g/dL   HCT 33.0 (L) 36.0 - 46.0 %   MCV 94.0 80.0 - 100.0 fL   MCH 30.2 26.0 - 34.0 pg   MCHC 32.1 30.0 - 36.0 g/dL   RDW 15.5 11.5 - 15.5 %   Platelets 567 (H) 150 - 400 K/uL   nRBC 0.0 0.0 - 0.2 %     Comment: Performed at [facility], [place] [address] [place], [place] [address]  Magnesium     Status: None   Collection Time: 01/13/20  1:06 AM  Result Value Ref Range   Magnesium 1.9 1.7 - 2.4 mg/dL    Comment: Performed at [facility] [address], [place], [place] [id number]  Basic metabolic panel     Status: Abnormal   Collection Time: 01/14/20  1:00 AM  Result Value Ref Range   Sodium 141 135 - 145 mmol/L   Potassium 3.8 3.5 - 5.1 mmol/L   Chloride 106 98 - 111 mmol/L   CO2 23 22 - 32 mmol/L   Glucose, Bld 91 70 - 99 mg/dL    Comment: Glucose reference range applies only to samples taken after fasting for at least 8 hours.   BUN 11 8 - 23 mg/dL   Creatinine, Ser 1.05 (H) 0.44 - 1.00 mg/dL   Calcium 8.8 (L) 8.9 - 10.3 mg/dL   GFR calc non Af Amer 45 (L) >60 mL/min   GFR calc Af Amer 53 (L) >60 mL/min   Anion gap 12 5 - 15    Comment: Performed at [facility] [address] [place], [place] [address]  CBC     Status: Abnormal   Collection Time: 01/14/20  1:00 AM  Result Value Ref Range   WBC 12.3 (H) 4.0 - 10.5 K/uL   RBC 3.63 (L) 3.87 - 5.11 MIL/uL   Hemoglobin 10.9 (L) 12.0 - 15.0 g/dL   HCT 34.1 (L) 36.0 - 46.0 %   MCV 93.9 80.0 - 100.0 fL   MCH 30.0 26.0 - 34.0 pg   MCHC 32.0 30.0 - 36.0 g/dL   RDW 15.5 11.5 - 15.5 %   Platelets 609 (H) 150 - 400 K/uL   nRBC 0.0 0.0 - 0.2 %    Comment: Performed at [facility] [address], [place], [place] [address]  Magnesium     Status: None   Collection Time: 01/14/20  1:00 AM  Result Value Ref Range   Magnesium 2.0 1.7 - 2.4 mg/dL    Comment: Performed at [facility], [place] [address], [place], [place] [address]   No results found.  Medical Problem List and Plan: 1.  Deficits with mobility, transfers, self-care secondary to multi-- Ortho.  -patient may not shower  -ELOS/Goals: 4-7 days/supervision/min a  Admit to CIR 2.  Antithrombotics: -DVT/anticoagulation:  Pharmaceutical:  Other (comment)--Elquis  -antiplatelet therapy: N/A 3. Pain Management: Continue Oxycodone prn  Monitor with increased exertion. 4. Mood: LCSW to follow for evaluation and support.   -antipsychotic agents: N/a 5. Neuropsych: This patient is capable of making decisions on her own behalf. 6. Skin/Wound Care: Wet to dry dressings daily. Add vitamin C/multivitamin to promote wound healing.  7. Fluids/Electrolytes/Nutrition: Started on soft diet.  Advance diet as tolerated offer supplements between meals.  8.  Left wrist fracture s/p internal fixation: NWB LUE--long arm splint X 2 weeks followed by suture removal and long arm cast for  immobilization X 2 additional weeks.   9. Perforated diverticulum s/p repair with colostomy:  Family refusing education and patient unable to perform care due to right wrist fracture.   Need to  Educate/encourage patient and family on care.  10. Purulent peritonitis: Zosyn changed to Augmentin X 7 days. CCS does not feel repeat CT abdomen/pelvis needed.  11. New onset A fib with RVR: ON amiodarone, metoprolol and Cardizem (added on 03/4).   Monitor with increased activity 12. Polymyalgia rheumatica: On prednisone--on vitamin A to mitigate steroid effect on wound healing.  13. Urinary retention: Monitor for retention.  PVRs ordered. 14. Hypothyroid: Continue Synthroid.    Bary Leriche, PA-C 01/14/2020  I have personally performed a face to face diagnostic evaluation, including, but not limited to relevant history and physical exam findings, of this patient and developed relevant assessment and plan.  Additionally, I have reviewed and concur with the physician assistant's documentation above.  Delice Lesch, MD, ABPMR

## 2020-01-13 NOTE — Progress Notes (Signed)
General Surgery Follow Up Note  Subjective:    Overnight Issues: NAEON, minimal PO intake over the last day. Some nausea last PM, but none overnight and none this AM. Ostomy productive.  Objective:  Vital signs for last 24 hours: Temp:  [98.2 F (36.8 C)-98.5 F (36.9 C)] 98.3 F (36.8 C) (03/04 0720) Pulse Rate:  [83-107] 87 (03/04 0720) Resp:  [16-17] 16 (03/04 0720) BP: (116-132)/(62-83) 126/73 (03/04 0720) SpO2:  [94 %-97 %] 97 % (03/04 0720)  Hemodynamic parameters for last 24 hours:    Intake/Output from previous day: 03/03 0701 - 03/04 0700 In: 3 [I.V.:3] Out: 3025 [Urine:2700; Stool:325]  Intake/Output this shift: Total I/O In: -  Out: 300 [Urine:300]  Physical Exam:  Gen: comfortable, no distress Neuro: non-focal exam HEENT: PERRL Neck: supple CV: RRR Pulm: unlabored breathing on [place] Abd: soft, NT, ostomy PPP, midline WtD with healthy granulation tissue at the base of the wound GU: clear yellow urine, foley replaced 3/2, replaced for retention Extr: wwp, no edema   Results for orders placed or performed during the hospital encounter of 12/29/19 (from the past 24 hour(s))  Basic metabolic panel     Status: Abnormal   Collection Time: 01/13/20  1:06 AM  Result Value Ref Range   Sodium 142 135 - 145 mmol/L   Potassium 3.7 3.5 - 5.1 mmol/L   Chloride 106 98 - 111 mmol/L   CO2 25 22 - 32 mmol/L   Glucose, Bld 92 70 - 99 mg/dL   BUN 10 8 - 23 mg/dL   Creatinine, Ser 0.83 0.44 - 1.00 mg/dL   Calcium 8.6 (L) 8.9 - 10.3 mg/dL   GFR calc non Af Amer >60 >60 mL/min   GFR calc Af Amer >60 >60 mL/min   Anion gap 11 5 - 15  CBC     Status: Abnormal   Collection Time: 01/13/20  1:06 AM  Result Value Ref Range   WBC 12.8 (H) 4.0 - 10.5 K/uL   RBC 3.51 (L) 3.87 - 5.11 MIL/uL   Hemoglobin 10.6 (L) 12.0 - 15.0 g/dL   HCT 33.0 (L) 36.0 - 46.0 %   MCV 94.0 80.0 - 100.0 fL   MCH 30.2 26.0 - 34.0 pg   MCHC 32.1 30.0 - 36.0 g/dL   RDW 15.5 11.5 - 15.5 %   Platelets 567 (H) 150 - 400 K/uL   nRBC 0.0 0.0 - 0.2 %  Magnesium     Status: None   Collection Time: 01/13/20  1:06 AM  Result Value Ref Range   Magnesium 1.9 1.7 - 2.4 mg/dL    Assessment & Plan: The plan of care was discussed with the bedside nurse for the day who is in agreement with this plan and no additional concerns were raised.   Present on Admission: . HTN (hypertension) . Hypothyroidism . Closed fracture of multiple ribs . Closed fracture of left distal radius    LOS: 15 days   Additional comments:I reviewed the patient's new clinical lab test results.    HTN Hypothyroidism Hx DVT in 2016 GERD PVD Recent fall w/ left wrist fx s/pL wrist repair by Dr. Caralyn Guile on 2/20   Perforated sigmoid diverticulitis with purulent peritonitis - s/p ex lap,sigmoid colectomy with end colostomy by Dr. Redmond Pulling 01/06/2020. Monitor ostomy output. BID wet to dry dressing changes, WOC on board for ostomy teaching and dressing changes. Encourage ambulation, IS/pulm toilet.  CKD w/ AKI- GFR and creatinine normalized New onset Atrial fibrillation -  remains in fib on amiodarone (started 2/27) and eliquis. Given age, co-morbidities, and h/o falls, would recommend risk/benefit discussion with patient and family. ABL anemia - monitor while on AC, currently stable FEN - continue FLD, encouraged nutritional supplementation and food from outside. She will have her son bring a smoothie and Carnation nutritional supplements because she likes the texture of that brand better.  Endo - h/o Polymyalgia rheumaticaon PO Prednisone 10mg  daily. Vitamin A for mitigate steroid effects on wound healing. ID - on augmentin x7d for intra-abdominal abscess seen on CT 3/2.  Foley - defer to primary team, previous retention DVT - SCDs, heparin gtt for afib, oral agent plan as above Follow-Up - Dr. Redmond Pulling Dispo - floor   Jesusita Oka, MD Trauma & General Surgery Please use [email or web address] to contact on call  provider  01/13/2020  *Care during the described time interval was provided by me. I have reviewed this patient's available data, including medical history, events of note, physical examination and test results as part of my evaluation.

## 2020-01-13 NOTE — Consult Note (Signed)
[facility] Nurse ostomy follow up Stoma type/location: LLQ colostomy Stomal assessment/size: 1 3/8" pink and moist, slightly budded Peristomal assessment: intact   Treatment options for stomal/peristomal skin: barrier ring and 1 piece flat pouch Output soft brown stool Ostomy pouching: 1pc.flat Education provided: Called and left message with daughter, Jana Half.  When I reported to room, son is at bedside and reinforced that neither he nor his sister want to learn ostomy care at this time.  They plan on utilizing home health.  I reinforced that their must be a teachable care giver and that care is intermittent for teaching.  They wish to pursue skilled rehab at this time and are open to learning ostomy care in that setting, son indicates. Patient listens to me and says she cannot participate in self care as her left arm is broken and wrapped. Rehab and ongoing skilled care is optimal at this time .  Enrolled patient in [facility] Start Discharge program: Yes [place] team will follow.  Domenic Moras MSN, RN, FNP-BC CWON Wound, Ostomy, Continence Nurse Pager [phone]

## 2020-01-13 NOTE — Progress Notes (Signed)
Initial Nutrition Assessment  DOCUMENTATION CODES:   Severe malnutrition in context of acute illness/injury  INTERVENTION:   D/C Boost Breeze, patient does not like.  Add Magic cup TID with meals, each supplement provides 290 kcal and 9 grams of protein.  Add Vital Cuisine Shake BID, each supplement provides 520 kcal and 22 grams of protein.  D/C calorie count, no documentation available today and patient discharging to rehab tomorrow. PO intake is meeting < 25% of estimated needs.   NUTRITION DIAGNOSIS:   Moderate Malnutrition related to acute illness(diverticulitis s/p ex lap) as evidenced by moderate muscle depletion, energy intake < or equal to 50% for > or equal to 5 days.  GOAL:   Patient will meet greater than or equal to 90% of their needs  MONITOR:   PO intake, Supplement acceptance, Labs  REASON FOR ASSESSMENT:   Consult Calorie Count   ASSESSMENT:   86 yo female admitted with wrist pain s/p fall. Found to have L radius and L rib fractures.S/P ORIF distal L radius fracture 2/20. CT of abd/pelvis showed perforated sigmoid diverticulitis. S/P ex lap, colectomy, and colostomy 2/25. PMH includes HTN, hypothyroidism, polymyalgia rheumatica, CKD IIIa.   Currently on a full liquid diet, eating poorly due to poor appetite. Patient c/o nausea. She does not like the Ensure or Boost Breeze supplements. She agreed to receive magic cup supplement with meals; she has had those in the past. Will also try Vital Cuisine shake with meals BID. Discussed ways to progressively increase protein and calorie intake. She says she is going to rehab unit tomorrow.   Labs reviewed.  Medications reviewed and include prednisone, florastor, vitamin A.  No new weight since 2/24, 74.8 kg. 76.7 kg on admission.  Mild-moderate depletion of muscle mass likely related to recent inadequate intake. Intake since admission has been minimal, providing </= 50% of estimated energy requirement for >/= 5  days.   Patient meets criteria for severe malnutrition in the context of acute illness.   NUTRITION - FOCUSED PHYSICAL EXAM:    Most Recent Value  Orbital Region  No depletion  Upper Arm Region  No depletion  Thoracic and Lumbar Region  No depletion  Buccal Region  No depletion  Temple Region  Mild depletion  Clavicle Bone Region  Mild depletion  Clavicle and Acromion Bone Region  Moderate depletion  Scapular Bone Region  Mild depletion  Dorsal Hand  Moderate depletion  Patellar Region  No depletion  Anterior Thigh Region  No depletion  Posterior Calf Region  Mild depletion  Edema (RD Assessment)  None  Hair  Reviewed  Eyes  Reviewed  Mouth  Reviewed  Skin  Reviewed  Nails  Reviewed       Diet Order:   Diet Order            Diet full liquid Room service appropriate? Yes; Fluid consistency: Thin  Diet effective now              EDUCATION NEEDS:   Education needs have been addressed  Skin:  Skin Assessment: Reviewed RN Assessment(surgical incisions to L wrist and abdomen)  Last BM:  3/4 colostomy  Height:   Ht Readings from Last 1 Encounters:  12/29/19 5\' 6"  (1.676 m)    Weight:   Wt Readings from Last 1 Encounters:  01/05/20 74.8 kg    Ideal Body Weight:  64.5 kg  BMI:  Body mass index is 26.62 kg/m.  Estimated Nutritional Needs:   Kcal:  1600-1800  Protein:  90-105 gm  Fluid:  >/= 1.5 L    Molli Barrows, RD, LDN, CNSC Please refer to [facility] for contact information.

## 2020-01-13 NOTE — Progress Notes (Signed)
Notifed Gerlean Ren, MD patient in Afib with RVR HR 130's.

## 2020-01-13 NOTE — Discharge Summary (Addendum)
Physician Discharge Summary  [name] [id number] DOB: 11-Apr-1923 DOA: 12/29/2019  PCP: [name], MD  Admit date: 12/29/2019 Discharge date: 01/14/2020  Admitted From: Home Disposition: CIR  Recommendations for Outpatient Follow-up:  1. Follow up with PCP in 1-2 weeks 2. Please obtain BMP/CBC in one week your next doctors visit.  3. Started amiodarone 200 mg daily, Cardizem 120 mg daily 4. Eliquis 5 mg twice daily 5. Oral Augmentin for total of 7 days 6. Oxycodone 5 mg as needed every 4-6 hours for moderate-to-severe pain.  Bowel regimen as needed. 7. Discontinue Norvasc 8. Urecholine added for urinary retention   Discharge Condition: Stable CODE STATUS: Full Diet recommendation: Full liquid diet, advance as tolerated to GI soft diet eventually 2 g salt diet  Brief/Interim Summary: 86 year old with HTN, hypothyroidism, polymyalgia rheumatica, CKD stage IIIa presented from [place] for evaluation of severe left wrist pain after a fall.  Patient was having abdominal pain with diarrhea as well.  Upon admission was found to have acute kidney injury, distal radius fracture and left lateral rib fractures.  Underwent ORIF by orthopedics for the left radial wrist fracture but due to worsening of abdominal pain she had a CT of the abdomen pelvis showing perforated sigmoid diverticulitis with pneumoperitoneum.  She underwent expiratory laparotomy, sigmoid colectomy with end colostomy by Dr. Redmond Pulling on 01/06/2020.  Hospital course also complicated by atrial fibrillation with RVR therefore cardiology team consulted also started on Eliquis.  Appropriate medication changes were made as stated above.  Repeat CT scanning showed possible small amount of fluid in the abdomen but not drainable.  She was also having urinary retention requiring Foley catheter placement.  Eventually she was transitioned to CIR.  Voiding trial was performed during the hospitalization which she had  initially failed therefore Foley catheter was placed.  Perforated sigmoid colon with pneumoperitoneum status post expiratory laparotomy, sigmoid colostomy with and pneumoperitoneum 2/25 -Postop management per surgery team.  Twice wet-to-dry dressing. -Advance diet as tolerated.  Continue full liquid diet today, slowly will advance to soft and then 2 g salt -Recommending 6 more days of oral Augmentin -If abdominal pain worsens, obtain CT abdomen pelvis -Advance diet per general surgery-no need for intervention, 5 days of Augmentin and repeat scan in about 72 hours if needed. -Remains afebrile  Atrial fibrillation with RVR, paroxysmal -Now on Eliquis twice daily -Amiodarone 200 mg p.o. daily, Cardizem and nadolol  Distal left radial fracture Lateral left rib fractures -Status post ORIF.  Nonweightbearing of the left upper extremity and follow-up outpatient with orthopedic. -PT/OT recommending CIR. -Rib fractures continue pain management.  Incentive spirometer.  Dizziness and fall, resolved -Resolved.  Echocardiogram [id number], grade 1 diastolic dysfunction  Polymyalgia rheumatica -Daily prednisone 10 mg p.o. daily  Essential hypertension -Now on Cardizem and nadolol  Hypothyroidism -This can be easily transition to p.o. once tolerating p.o. consistently  Normocytic anemia -Hemoglobin stable  Acute urinary retention requiring Foley catheter placement.  Voiding trial today, if fails maintain Foley   Discharge Diagnoses:  Principal Problem:   Acute renal failure superimposed on stage 3a chronic kidney disease ([place]) Active Problems:   Hypothyroidism   HTN (hypertension)   Preoperative cardiovascular examination   Closed fracture of multiple ribs   Closed fracture of left distal radius   Recurrent falls   Protein-calorie malnutrition, severe    Consultations:  General surgery  Cardiology  Subjective: Feels much better today, no complaints.  Wants to try regular  diet but I  explained her that we have to do it slowly  Discharge Exam: Vitals:   01/13/20 2131 01/14/20 0734  BP: 126/72 (!) 142/68  Pulse: 81 89  Resp: 18 17  Temp: 98.1 F (36.7 C) 97.9 F (36.6 C)  SpO2: 98% 95%   Vitals:   01/13/20 1132 01/13/20 1603 01/13/20 2131 01/14/20 0734  BP: 128/80 101/60 126/72 (!) 142/68  Pulse:  79 81 89  Resp:  16 18 17   Temp:  98 F (36.7 C) 98.1 F (36.7 C) 97.9 F (36.6 C)  TempSrc:   Oral Oral  SpO2:  97% 98% 95%  Weight:      Height:        General: Pt is alert, awake, not in acute distress, chronically ill Cardiovascular: RRR, S1/S2 +, no rubs, no gallops Respiratory: CTA bilaterally, no wheezing, no rhonchi Abdominal: Soft, NT, ND, bowel sounds +, ostomy bag on the abdomen noted Extremities: no edema, no cyanosis Foley catheter in place.  Discharge Instructions   Allergies as of 01/14/2020      Reactions   Codeine Other (See Comments)   Nightmares, imagining things   Keflex [cephalexin] Nausea Only   Pt ended up in ER w/ CP      Medication List    STOP taking these medications   amLODipine 10 MG tablet Commonly known as: NORVASC     TAKE these medications   Advil Dual Action 125-250 MG Tabs Generic drug: Ibuprofen-Acetaminophen Take 2 tablets by mouth as needed (pain).   amiodarone 200 MG tablet Commonly known as: PACERONE Take 1 tablet (200 mg total) by mouth daily.   amoxicillin-clavulanate 875-125 MG tablet Commonly known as: AUGMENTIN Take 1 tablet by mouth every 12 (twelve) hours for 7 days.   apixaban 5 MG Tabs tablet Commonly known as: ELIQUIS Take 1 tablet (5 mg total) by mouth 2 (two) times daily.   bethanechol 5 MG tablet Commonly known as: URECHOLINE Take 1 tablet (5 mg total) by mouth 3 (three) times daily.   diltiazem 120 MG 24 hr capsule Commonly known as: CARDIZEM CD Take 1 capsule (120 mg total) by mouth daily.   gabapentin 300 MG capsule Commonly known as: NEURONTIN TAKE 2 CAPSULES  BY MOUTH AT BEDTIME   levothyroxine 50 MCG tablet Commonly known as: SYNTHROID TAKE 1 TABLET BY MOUTH EVERY DAY   nadolol 80 MG tablet Commonly known as: CORGARD TAKE 1 TABLET BY MOUTH EVERY DAY   oxyCODONE 5 MG immediate release tablet Commonly known as: Oxy IR/ROXICODONE Take 1-1.5 tablets (5-7.5 mg total) by mouth every 4 (four) hours as needed for moderate pain or severe pain (5mg  for moderate pain, 7.5mg  for severe pain. Hold for SBP<90).   pantoprazole 40 MG tablet Commonly known as: PROTONIX TAKE 1 TABLET BY MOUTH EVERY DAY   predniSONE 5 MG tablet Commonly known as: DELTASONE Take 10 mg by mouth daily with breakfast.   PreserVision AREDS 2 Caps Take 1 capsule by mouth daily.      Follow-up Information    [name], MD. Schedule an appointment as soon as possible for a visit in 1 week(s).   Specialty: Family Medicine Why: Hospital follow up Contact information: [address] [place] [address] [phone]        Iran Planas, MD. Schedule an appointment as soon as possible for a visit in 2 week(s).   Specialty: Orthopedic Surgery Why: Hospital follow up Contact information: [address] [place]  [id number]        Greer Pickerel, MD. Go on 02/02/2020.   Specialty: General Surgery Why: Your appointment is 3/24 @9 :15am. Please arrive 30 minutes prior to your appointment to check in and fill out paperwork. Bring photo ID and insurance information. Contact information: [address] [place] [address] [phone]          Allergies  Allergen Reactions  . Codeine Other (See Comments)    Nightmares, imagining things  . Keflex [Cephalexin] Nausea Only    Pt ended up in ER w/ CP    You were cared for by a hospitalist during your hospital stay. If you have any questions about your discharge medications or the care you received while you were in the hospital after you are  discharged, you can call the unit and asked to speak with the hospitalist on call if the hospitalist that took care of you is not available. Once you are discharged, your primary care physician will handle any further medical issues. Please note that no refills for any discharge medications will be authorized once you are discharged, as it is imperative that you return to your primary care physician (or establish a relationship with a primary care physician if you do not have one) for your aftercare needs so that they can reassess your need for medications and monitor your lab values.   Procedures/Studies: DG Ribs Unilateral W/Chest Left  Result Date: 12/29/2019 CLINICAL DATA:  Fall, left lateral rib pain EXAM: LEFT RIBS AND CHEST - 3+ VIEW COMPARISON:  02/16/2018 FINDINGS: Fractures through the left lateral 8th and 9th ribs and possibly the lateral 7th rib. Heart is mildly enlarged. Left base atelectasis or scarring. No pneumothorax. No effusions. Mild wedged shaped appearance of the T11 vertebral body, age indeterminate. IMPRESSION: Left lateral 8th and 9th rib fractures and possible lateral 7th rib fracture. Left base atelectasis or scarring. No effusion or pneumothorax. Wedge-shaped appearance of the T11 vertebral body compatible with mild compression deformity, age indeterminate. Electronically Signed   By: Rolm Baptise M.D.   On: 12/29/2019 11:05   DG Wrist Complete Left  Result Date: 12/29/2019 CLINICAL DATA:  Left wrist pain after a fall today. Initial encounter. EXAM: LEFT WRIST - COMPLETE 3+ VIEW COMPARISON:  Plain films left wrist 12/24/2009. FINDINGS: Volar plate and screws are in place for fixation of a distal radius fracture which was present on the 2011 examination. There is an acute oblique fracture of the radius at the proximal end of the plate with 1/2 shaft width lateral displacement of the distal fragment. Previously seen distal radius and ulnar fractures are healed. There is ulnar  positive variance. Bones appear osteopenic. IMPRESSION: Acute oblique fracture of the distal radius at the proximal end of plate and screws in place for fixation of a remote healed fracture. No other acute abnormality. Remote healed distal radius and ulnar fractures. Osteopenia. Electronically Signed   By: Inge Rise M.D.   On: 12/29/2019 11:08   CT ABDOMEN PELVIS W CONTRAST  Result Date: 01/11/2020 CLINICAL DATA:  Intra-abdominal abscess EXAM: CT ABDOMEN AND PELVIS WITH CONTRAST TECHNIQUE: Multidetector CT imaging of the abdomen and pelvis was performed using the standard protocol following bolus administration of intravenous contrast. CONTRAST:  179mL OMNIPAQUE IOHEXOL 300 MG/ML  SOLN COMPARISON:  CT 01/05/2020, 03/04/2016 FINDINGS: Lower chest: Lung bases demonstrate interim development of small pleural effusions. Partial consolidation left base likely atelectasis. Cardiomegaly with coronary vascular calcification. Hepatobiliary: Chronic wedge shaped hypoenhancing  region in the peripheral right hepatic lobe. Gallstones. No biliary dilatation Pancreas: Fatty atrophy.  No inflammatory changes Spleen: Normal in size without focal abnormality. Adrenals/Urinary Tract: Adrenal glands are within normal limits. Cyst within the bilateral kidneys. Small amount of air in the urinary bladder. Stomach/Bowel: The stomach is nonenlarged. No dilated small bowel. Interval sigmoid colectomy with creation of left lower quadrant colostomy. Mild wall thickening and inflammatory change involving the left lower quadrant ostomy. Vascular/Lymphatic: Moderate aortic atherosclerosis. No aneurysm. No significant adenopathy. Retroaortic left renal vein. Reproductive: Status post hysterectomy. No adnexal masses. Other: Resolution of previously noted free air. Interval ventral wound. Fluid in the bilateral lower quadrants. Left lower quadrant fluid collection demonstrates mild rim enhancement. This measures 6.1 by 1.8 by 4.7 cm. Small  rim enhancing fluid collection in the right posterior pelvis measures 3 by 1.5 by 1.6 cm, series 3, image number 64. Fluid in the right lower quadrant without discernible rim enhancement at this time. Musculoskeletal: Displaced left ninth posterolateral rib fracture. Moderate wedge compression deformity at T11. Advanced degenerative changes of the lumbar spine. IMPRESSION: 1. Interval sigmoid colectomy with creation of left lower quadrant colostomy. Mildly rim enhancing fluid collections within the left lower quadrant and right posterior pelvis, suspect for potential developing abscess. Small amount of fluid in the right lower quadrant as well though without appreciable rim enhancement at this time. Resolution of previously noted free air. 2. Slight thickened appearance of the left lower quadrant colostomy with surrounding soft tissue stranding, possibly due to postoperative edema and inflammation. 3. New small pleural effusions with atelectasis at the left base. 4. Gallstones Electronically Signed   By: Donavan Foil M.D.   On: 01/11/2020 17:57   CT ABDOMEN PELVIS W CONTRAST  Result Date: 01/05/2020 CLINICAL DATA:  Abdominal pain, recent fall with known fractures EXAM: CT ABDOMEN AND PELVIS WITH CONTRAST TECHNIQUE: Multidetector CT imaging of the abdomen and pelvis was performed using the standard protocol following bolus administration of intravenous contrast. CONTRAST:  131mL OMNIPAQUE IOHEXOL 300 MG/ML  SOLN COMPARISON:  03/04/2016 FINDINGS: Lower chest: Limited imaging through the lung bases demonstrates minimal hypoventilatory changes. No effusion or pneumothorax. Hepatobiliary: No focal liver abnormality is seen. No gallstones, gallbladder wall thickening, or biliary dilatation. Pancreas: There is diffuse fatty atrophy of the pancreas. No acute abnormalities. Spleen: No splenic injury or perisplenic hematoma. Adrenals/Urinary Tract: Stable right renal cortical cyst. Kidneys enhance normally and  symmetrically. No urinary tract calculi or obstructive uropathy. The bladder is unremarkable. Adrenal glands are stable. Stomach/Bowel: Inflammatory changes surround the sigmoid colon consistent with acute diverticulitis. There is extensive pneumoperitoneum consistent with ruptured diverticulum. I do not see any fluid collection or abscess. No bowel obstruction or ileus. Vascular/Lymphatic: Stable atherosclerosis of the abdominal aorta and its branches. No critical stenosis. No pathologic adenopathy within the abdomen or pelvis. Reproductive: Status post hysterectomy. No adnexal masses. Other: There is diffuse pneumoperitoneum consistent with perforated diverticulitis. Trace free fluid within the pelvis. No fluid collection or abscess. Musculoskeletal: Displaced posterolateral left seventh through ninth rib fractures are noted. Wedge compression deformity of the T11 vertebral body appears chronic, as there is no visible fracture line or paraspinal hematoma. Reconstructed images demonstrate no additional findings. IMPRESSION: 1. Perforated sigmoid diverticulitis with extensive pneumoperitoneum. No fluid collection or abscess. 2. Displaced posterolateral left seventh through ninth rib fractures. 3. Wedge compression deformity of the T11 vertebral body appears chronic, as there is no visible fracture line or paraspinal hematoma. 4. Aortic Atherosclerosis (ICD10-I70.0). These results were called  by telephone at the time of interpretation on 01/05/2020 at 7:32 pm to provider Lang Snow, who verbally acknowledged these results. Electronically Signed   By: Randa Ngo M.D.   On: 01/05/2020 19:32   DG Chest Port 1 View  Result Date: 01/11/2020 CLINICAL DATA:  Nausea EXAM: PORTABLE CHEST 1 VIEW COMPARISON:  12/30/2019 FINDINGS: Cardiomegaly. Left base atelectasis and small left pleural effusion. Right lung clear. Left rib fractures and remote left humeral neck fracture again noted. IMPRESSION: Left base atelectasis,  small left effusion. Electronically Signed   By: Rolm Baptise M.D.   On: 01/11/2020 10:07   DG CHEST PORT 1 VIEW  Result Date: 12/30/2019 CLINICAL DATA:  86 year old with hypoxia. Acute LEFT rib fractures due to a fall yesterday. EXAM: PORTABLE CHEST 1 VIEW COMPARISON:  12/29/2019 and earlier. FINDINGS: Cardiac silhouette mildly enlarged for AP portable technique, unchanged. Worsening atelectasis at the LEFT lung base since yesterday's examination. Lungs remain clear otherwise. Normal pulmonary vascularity. Mildly displaced LEFT posterolateral seventh, eighth and ninth rib fractures again noted. 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 IMPRESSION: 1. Worsening atelectasis at the LEFT lung base. No acute cardiopulmonary disease otherwise. 2. Stable mild cardiomegaly without pulmonary edema. Electronically Signed   By: Evangeline Dakin M.D.   On: 12/30/2019 11:29   DG Abd 2 Views  Result Date: 01/11/2020 CLINICAL DATA:  Abdominal pain with nausea this morning. EXAM: ABDOMEN - 2 VIEW COMPARISON:  Abdominal CT 01/05/2020. FINDINGS: The bowel gas pattern is nonobstructive. There is no free intraperitoneal air or bowel wall thickening. There are small left-greater-than-right pleural effusions with associated left lower lobe atelectasis. No suspicious abdominal calcifications. Degenerative changes throughout the spine, a convex right lumbar scoliosis and chronic compression deformity at T11 are unchanged. IMPRESSION: No acute abdominal findings. Small pleural effusions and left lower lobe atelectasis. Electronically Signed   By: Richardean Sale M.D.   On: 01/11/2020 13:54   ECHOCARDIOGRAM COMPLETE  Result Date: 12/30/2019    ECHOCARDIOGRAM REPORT   Patient Name:   SHAMAINE CINK Date of Exam: 12/30/2019 Medical Rec #:  [id number]       Height:       66.0 in Accession #:    [id number]      Weight:       169.0 lb Date of Birth:  Jan 26, 1923        BSA:          1.86 m Patient  Age:    72 years        BP:           116/69 mmHg Patient Gender: F               HR:           71 bpm. Exam Location:  Inpatient Procedure: 2D Echo, Cardiac Doppler and Color Doppler Indications:    Dizziness [id number]  History:        Patient has no prior history of Echocardiogram examinations.                 Risk Factors:Hypertension. GERD. DVT. Hypothyroidism.  Sonographer:    Tiffany Dance Referring Phys: [id number] [place]  1. Left ventricular ejection fraction, by estimation, is 50%. The left ventricle has mildly decreased function. The left ventricle demonstrates regional wall motion abnormalities. Apical septal and apical anterior hypokinesis noted. Left ventricular diastolic parameters are consistent with Grade I diastolic dysfunction (impaired relaxation).  2.  Right ventricular systolic function is normal. The right ventricular size is normal. Tricuspid regurgitation signal is inadequate for assessing PA pressure.  3. The mitral valve is normal in structure and function. Trivial mitral valve regurgitation. No evidence of mitral stenosis.  4. The aortic valve is tricuspid. Aortic valve regurgitation is not visualized. Mild aortic valve sclerosis is present, with no evidence of aortic valve stenosis.  5. The inferior vena cava is normal in size with greater than 50% respiratory variability, suggesting right atrial pressure of 3 mmHg. FINDINGS  Left Ventricle: Left ventricular ejection fraction, by estimation, is 50%. The left ventricle has mildly decreased function. The left ventricle demonstrates regional wall motion abnormalities. The left ventricular internal cavity size was normal in size. There is no left ventricular hypertrophy. Left ventricular diastolic parameters are consistent with Grade I diastolic dysfunction (impaired relaxation). Right Ventricle: The right ventricular size is normal. No increase in right ventricular wall thickness. Right ventricular systolic function is normal.  Tricuspid regurgitation signal is inadequate for assessing PA pressure. Left Atrium: Left atrial size was normal in size. Right Atrium: Right atrial size was normal in size. Pericardium: There is no evidence of pericardial effusion. Mitral Valve: The mitral valve is normal in structure and function. Mild mitral annular calcification. Trivial mitral valve regurgitation. No evidence of mitral valve stenosis. Tricuspid Valve: The tricuspid valve is normal in structure. Tricuspid valve regurgitation is trivial. Aortic Valve: The aortic valve is tricuspid. Aortic valve regurgitation is not visualized. Mild aortic valve sclerosis is present, with no evidence of aortic valve stenosis. Pulmonic Valve: The pulmonic valve was normal in structure. Pulmonic valve regurgitation is not visualized. Aorta: The aortic root is normal in size and structure. Venous: The inferior vena cava is normal in size with greater than 50% respiratory variability, suggesting right atrial pressure of 3 mmHg. IAS/Shunts: No atrial level shunt detected by color flow Doppler.  LEFT VENTRICLE PLAX 2D LVIDd:         5.30 cm  Diastology LVIDs:         3.80 cm  LV e' lateral:   7.45 cm/s LV PW:         1.00 cm  LV E/e' lateral: 11.4 LV IVS:        0.90 cm  LV e' medial:    4.64 cm/s LVOT diam:     2.00 cm  LV E/e' medial:  18.4 LV SV:         68.17 ml LV SV Index:   38.50 LVOT Area:     3.14 cm  RIGHT VENTRICLE            IVC RV Basal diam:  2.50 cm    IVC diam: 1.40 cm RV S prime:     9.11 cm/s TAPSE (M-mode): 2.2 cm LEFT ATRIUM             Index       RIGHT ATRIUM           Index LA diam:        4.40 cm 2.36 cm/m  RA Area:     10.50 cm LA Vol (A2C):   55.0 ml 29.54 ml/m RA Volume:   18.60 ml  9.99 ml/m LA Vol (A4C):   52.7 ml 28.31 ml/m LA Biplane Vol: 55.7 ml 29.92 ml/m  AORTIC VALVE LVOT Vmax:   94.60 cm/s LVOT Vmean:  58.400 cm/s LVOT VTI:    0.217 m  AORTA Ao Root diam: 3.10 cm Ao Asc  diam:  2.80 cm MITRAL VALVE MV Area (PHT): 3.91 cm      SHUNTS MV Decel Time: 194 msec     Systemic VTI:  0.22 m MV E velocity: 85.20 cm/s   Systemic Diam: 2.00 cm MV A velocity: 113.00 cm/s MV E/A ratio:  0.75 Loralie Champagne MD Electronically signed by Loralie Champagne MD Signature Date/Time: 12/30/2019/3:46:45 PM    Final      The results of significant diagnostics from this hospitalization (including imaging, microbiology, ancillary and laboratory) are listed below for reference.     Microbiology: Recent Results (from the past 240 hour(s))  Culture, blood (routine x 2)     Status: None   Collection Time: 01/08/20  8:11 AM   Specimen: BLOOD LEFT HAND  Result Value Ref Range Status   Specimen Description BLOOD LEFT HAND  Final   Special Requests   Final    BOTTLES DRAWN AEROBIC ONLY Blood Culture results may not be optimal due to an inadequate volume of blood received in culture bottles   Culture   Final    NO GROWTH 5 DAYS Performed at [facility], [place] [address], [place], [place] [address]    Report Status 01/13/2020 FINAL  Final  Culture, blood (routine x 2)     Status: None   Collection Time: 01/08/20  8:12 AM   Specimen: BLOOD LEFT FOREARM  Result Value Ref Range Status   Specimen Description BLOOD LEFT FOREARM  Final   Special Requests   Final    BOTTLES DRAWN AEROBIC ONLY Blood Culture results may not be optimal due to an inadequate volume of blood received in culture bottles   Culture   Final    NO GROWTH 5 DAYS Performed at [facility], [place] [address], [place], [place] [address]    Report Status 01/13/2020 FINAL  Final     Labs: BNP (last 3 results) No results for input(s): BNP in the last 8760 hours. Basic Metabolic Panel: Recent Labs  Lab 01/08/20 0553 01/10/20 0221 01/12/20 0527 01/13/20 0106 01/14/20 0100  NA 142 140 140 142 141  K 3.7 3.7 3.5 3.7 3.8  CL 110 103 104 106 106  CO2 23 25 25 25 23   GLUCOSE 109* 87 84 92 91  BUN 22 14 11 10 11   CREATININE 0.95 0.72 0.88 0.83 1.05*  CALCIUM 8.6* 8.5*  8.7* 8.6* 8.8*  MG  --   --   --  1.9 2.0   Liver Function Tests: No results for input(s): AST, ALT, ALKPHOS, BILITOT, PROT, ALBUMIN in the last 168 hours. No results for input(s): LIPASE, AMYLASE in the last 168 hours. No results for input(s): AMMONIA in the last 168 hours. CBC: Recent Labs  Lab 01/08/20 0553 01/08/20 0553 01/09/20 0531 01/09/20 0531 01/10/20 0221 01/11/20 1139 01/12/20 0527 01/13/20 0106 01/14/20 0100  WBC 16.1*   < > 13.6*   < > 13.2* 20.3* 17.0* 12.8* 12.3*  NEUTROABS 13.5*  --  9.2*  --  8.1*  --  11.1*  --   --   HGB 9.2*   < > 9.8*   < > 9.4* 10.9* 10.3* 10.6* 10.9*  HCT 29.3*   < > 31.8*   < > 29.7* 34.5* 32.9* 33.0* 34.1*  MCV 95.8   < > 97.5   < > 95.2 95.0 95.9 94.0 93.9  PLT 336   < > 406*   < > 421* 556* 556* 567* 609*   < > =  values in this interval not displayed.   Cardiac Enzymes: No results for input(s): CKTOTAL, CKMB, CKMBINDEX, TROPONINI in the last 168 hours. BNP: Invalid input(s): POCBNP CBG: Recent Labs  Lab 01/08/20 0624 01/08/20 2014  GLUCAP 96 121*   D-Dimer No results for input(s): DDIMER in the last 72 hours. Hgb A1c No results for input(s): HGBA1C in the last 72 hours. Lipid Profile No results for input(s): CHOL, HDL, LDLCALC, TRIG, CHOLHDL, LDLDIRECT in the last 72 hours. Thyroid function studies No results for input(s): TSH, T4TOTAL, T3FREE, THYROIDAB in the last 72 hours.  Invalid input(s): FREET3 Anemia work up No results for input(s): VITAMINB12, FOLATE, FERRITIN, TIBC, IRON, RETICCTPCT in the last 72 hours. Urinalysis    Component Value Date/Time   COLORURINE YELLOW 01/05/2020 0621   APPEARANCEUR HAZY (A) 01/05/2020 0621   LABSPEC 1.014 01/05/2020 0621   PHURINE 6.0 01/05/2020 0621   GLUCOSEU NEGATIVE 01/05/2020 0621   GLUCOSEU NEGATIVE 02/22/2016 0926   HGBUR NEGATIVE 01/05/2020 0621   BILIRUBINUR NEGATIVE 01/05/2020 0621   BILIRUBINUR negative 06/07/2019 1311   KETONESUR NEGATIVE 01/05/2020 0621    PROTEINUR NEGATIVE 01/05/2020 0621   UROBILINOGEN 0.2 06/07/2019 1311   UROBILINOGEN 0.2 02/22/2016 0926   NITRITE NEGATIVE 01/05/2020 0621   LEUKOCYTESUR NEGATIVE 01/05/2020 [id number]   Sepsis Labs Invalid input(s): PROCALCITONIN,  WBC,  LACTICIDVEN Microbiology Recent Results (from the past 240 hour(s))  Culture, blood (routine x 2)     Status: None   Collection Time: 01/08/20  8:11 AM   Specimen: BLOOD LEFT HAND  Result Value Ref Range Status   Specimen Description BLOOD LEFT HAND  Final   Special Requests   Final    BOTTLES DRAWN AEROBIC ONLY Blood Culture results may not be optimal due to an inadequate volume of blood received in culture bottles   Culture   Final    NO GROWTH 5 DAYS Performed at [facility], [place] [address] [place], [place] [address]    Report Status 01/13/2020 FINAL  Final  Culture, blood (routine x 2)     Status: None   Collection Time: 01/08/20  8:12 AM   Specimen: BLOOD LEFT FOREARM  Result Value Ref Range Status   Specimen Description BLOOD LEFT FOREARM  Final   Special Requests   Final    BOTTLES DRAWN AEROBIC ONLY Blood Culture results may not be optimal due to an inadequate volume of blood received in culture bottles   Culture   Final    NO GROWTH 5 DAYS Performed at [facility], [place] [address] [place],  [address]    Report Status 01/13/2020 FINAL  Final     Time coordinating discharge:  I have spent 35 minutes face to face with the patient and on the ward discussing the patients care, assessment, plan and disposition with other care givers. >50% of the time was devoted counseling the patient about the risks and benefits of treatment/Discharge disposition and coordinating care.   SIGNED:   Damita Lack, MD  [facility] 01/14/2020, 10:05 AM   If 7PM-7AM, please contact night-coverage

## 2020-01-13 NOTE — TOC Progression Note (Signed)
Transition of Care [facility]) - Progression Note    Patient Details  Name: [name] MRN: [id number] Date of Birth: 1923-10-20  [facility] CM/SW Contact  Pollie Friar, RN Phone Number: 01/13/2020, 2:05 PM  Clinical Narrative:    Have insurance approval for CIR admission but no beds today. Hope for bed tomorrow. Family aware they need to decide on person to take care of colostomy at home after rehab. Son to talk with his sister and wife to come up with name of person to learn colostomy care.  TOC following.   Expected Discharge Plan: [place] Barriers to Discharge: Continued Medical Work up  Expected Discharge Plan and Services Expected Discharge Plan: [place] In-house Referral: Clinical Social Work Discharge Planning Services: CM Consult   Living arrangements for the past 2 months: Single Family Home Expected Discharge Date: 01/13/20                                     Social Determinants of Health (SDOH) Interventions    Readmission Risk Interventions No flowsheet data found.

## 2020-01-13 NOTE — Progress Notes (Signed)
PT Cancellation Note  Patient Details Name: [name] MRN: [id number] DOB: [birth year]   Cancelled Treatment:    Reason Eval/Treat Not Completed: Fatigue/lethargy limiting ability to participate - Pt reports walking with RN today, states she is fatigued at this time. Will check back as schedule permits.   [place] Pager [phone]  Office [place] 01/13/2020, 5:04 PM

## 2020-01-13 NOTE — Progress Notes (Signed)
01/13/20 1400  OT Visit Information  Last OT Received On 01/13/20  Assistance Needed +1  History of Present Illness 86 yo female presenting to [facility] for severe left wrist pain after fall.  Pt reports that she had been experiencing upset stomach and diarrhea for a few days, had been lightheaded upon standing, had a fall without any significant appreciable injury yesterday, and then fell again this morning onto her left arm. Xrays show radial fx and left lateral 8th and 9th rib fxs (possibly the 7th as well). Pt underwent colostomy on 2/25.  PMH including is a HTN, hypothyroidism, polymyalgia rheumatica, and chronic kidney disease III.  Precautions  Precautions Fall  Precaution Comments L side ostomy bag, check before mobilizing  Pain Assessment  Pain Assessment No/denies pain  Cognition  Arousal/Alertness Awake/alert  Behavior During Therapy WFL for tasks assessed/performed  Overall Cognitive Status Within Functional Limits for tasks assessed  ADL  Overall ADL's  Needs assistance/impaired  Grooming Oral care;Sitting;Set up;Brushing hair  Grooming Details (indicate cue type and reason) seated in recliner  Upper Body Dressing  Moderate assistance;Sitting  Upper Body Dressing Details (indicate cue type and reason) dressing L UE first  Lower Body Dressing Details (indicate cue type and reason) crossed foot over opposite knee to pull up socks  Toilet Transfer Minimal assistance;Ambulation;BSC;RW  Functional mobility during ADLs Minimal assistance;Rolling walker  Bed Mobility  Overal bed mobility Needs Assistance  Bed Mobility Supine to Sit  Supine to sit Supervision  General bed mobility comments no physical assist, HOB up  Balance  Overall balance assessment Needs assistance  Sitting balance-Leahy Scale Good  Standing balance support Bilateral upper extremity supported  Standing balance-Leahy Scale Poor  Standing balance comment reliant on UE support of RW and min assist  Restrictions   LUE Weight Bearing Weight bear through elbow only  Other Position/Activity Restrictions may use platform walker  Transfers  Overall transfer level Needs assistance  Equipment used Left platform walker  Transfers Sit to/from Stand  Sit to Stand Mod assist  General transfer comment assist to rise and steady  OT - End of Session  Equipment Utilized During Treatment Gait belt;Rolling walker  Activity Tolerance Patient tolerated treatment well  Patient left in chair;with call bell/phone within reach;with family/visitor present  OT Assessment/Plan  OT Plan Discharge plan remains appropriate  OT Visit Diagnosis Unsteadiness on feet (R26.81);Other abnormalities of gait and mobility (R26.89);Muscle weakness (generalized) (M62.81);Pain  OT Frequency (ACUTE ONLY) Min 3X/week  Follow Up Recommendations CIR  OT Equipment 3 in 1 bedside commode  AM-PAC OT "6 Clicks" Daily Activity Outcome Measure (Version 2)  Help from another person eating meals? 3  Help from another person taking care of personal grooming? 3  Help from another person toileting, which includes using toliet, bedpan, or urinal? 2  Help from another person bathing (including washing, rinsing, drying)? 2  Help from another person to put on and taking off regular upper body clothing? 2  Help from another person to put on and taking off regular lower body clothing? 2  6 Click Score 14  OT Goal Progression  Progress towards OT goals Progressing toward goals  Acute Rehab OT Goals  Patient Stated Goal to go to rehab  OT Goal Formulation With patient  Time For Goal Achievement 01/17/20  Potential to Achieve Goals Good  OT Time Calculation  OT Start Time (ACUTE ONLY) 1220  OT Stop Time (ACUTE ONLY) 1238  OT Time Calculation (min) 18 min  OT General  Charges  $OT Visit 1 Visit  OT Treatments  $Self Care/Home Management  8-22 mins  Nestor Lewandowsky, OTR/L Acute Rehabilitation Services Pager: [phone] Office: [phone]

## 2020-01-13 NOTE — Progress Notes (Signed)
Inpatient Rehab Admissions Coordinator:   Met with pt and son at bedside.  I have insurance authorization for CIR admission, but I do not have a bed available for this patient today.  Per pt, her daughter, Jana Half, will manage ostomy at home and will be available for training prior to discharge.  Will f/u with pt tomorrow for possible admission pending bed availability.   Shann Medal, PT, DPT Admissions Coordinator [phone] 01/13/20  2:50 PM

## 2020-01-14 ENCOUNTER — Encounter (HOSPITAL_COMMUNITY): Payer: Self-pay | Admitting: Physical Medicine & Rehabilitation

## 2020-01-14 ENCOUNTER — Inpatient Hospital Stay (HOSPITAL_COMMUNITY)
Admission: RE | Admit: 2020-01-14 | Discharge: 2020-01-20 | DRG: 945 | Disposition: A | Discharge status: Home Health | Payer: Medicare PPO | Source: Intra-hospital | Attending: Physical Medicine & Rehabilitation | Admitting: Physical Medicine & Rehabilitation

## 2020-01-14 ENCOUNTER — Other Ambulatory Visit: Payer: Self-pay

## 2020-01-14 DIAGNOSIS — Z79890 Hormone replacement therapy (postmenopausal): Secondary | ICD-10-CM | POA: Diagnosis not present

## 2020-01-14 DIAGNOSIS — Z79899 Other long term (current) drug therapy: Secondary | ICD-10-CM

## 2020-01-14 DIAGNOSIS — Z803 Family history of malignant neoplasm of breast: Secondary | ICD-10-CM

## 2020-01-14 DIAGNOSIS — Z885 Allergy status to narcotic agent status: Secondary | ICD-10-CM

## 2020-01-14 DIAGNOSIS — K5780 Diverticulitis of intestine, part unspecified, with perforation and abscess without bleeding: Secondary | ICD-10-CM | POA: Diagnosis not present

## 2020-01-14 DIAGNOSIS — E039 Hypothyroidism, unspecified: Secondary | ICD-10-CM | POA: Diagnosis present

## 2020-01-14 DIAGNOSIS — S52592A Other fractures of lower end of left radius, initial encounter for closed fracture: Secondary | ICD-10-CM | POA: Diagnosis not present

## 2020-01-14 DIAGNOSIS — R339 Retention of urine, unspecified: Secondary | ICD-10-CM | POA: Diagnosis present

## 2020-01-14 DIAGNOSIS — D72829 Elevated white blood cell count, unspecified: Secondary | ICD-10-CM | POA: Diagnosis not present

## 2020-01-14 DIAGNOSIS — I739 Peripheral vascular disease, unspecified: Secondary | ICD-10-CM | POA: Diagnosis present

## 2020-01-14 DIAGNOSIS — T1490XA Injury, unspecified, initial encounter: Secondary | ICD-10-CM | POA: Diagnosis present

## 2020-01-14 DIAGNOSIS — Z791 Long term (current) use of non-steroidal anti-inflammatories (NSAID): Secondary | ICD-10-CM

## 2020-01-14 DIAGNOSIS — T380X5A Adverse effect of glucocorticoids and synthetic analogues, initial encounter: Secondary | ICD-10-CM | POA: Diagnosis not present

## 2020-01-14 DIAGNOSIS — K659 Peritonitis, unspecified: Secondary | ICD-10-CM | POA: Diagnosis not present

## 2020-01-14 DIAGNOSIS — Z7952 Long term (current) use of systemic steroids: Secondary | ICD-10-CM | POA: Diagnosis not present

## 2020-01-14 DIAGNOSIS — N179 Acute kidney failure, unspecified: Secondary | ICD-10-CM | POA: Diagnosis present

## 2020-01-14 DIAGNOSIS — M353 Polymyalgia rheumatica: Secondary | ICD-10-CM | POA: Diagnosis present

## 2020-01-14 DIAGNOSIS — R58 Hemorrhage, not elsewhere classified: Secondary | ICD-10-CM | POA: Diagnosis present

## 2020-01-14 DIAGNOSIS — R338 Other retention of urine: Secondary | ICD-10-CM

## 2020-01-14 DIAGNOSIS — Z85820 Personal history of malignant melanoma of skin: Secondary | ICD-10-CM

## 2020-01-14 DIAGNOSIS — M1990 Unspecified osteoarthritis, unspecified site: Secondary | ICD-10-CM | POA: Diagnosis not present

## 2020-01-14 DIAGNOSIS — D72828 Other elevated white blood cell count: Secondary | ICD-10-CM | POA: Diagnosis not present

## 2020-01-14 DIAGNOSIS — I129 Hypertensive chronic kidney disease with stage 1 through stage 4 chronic kidney disease, or unspecified chronic kidney disease: Secondary | ICD-10-CM | POA: Diagnosis not present

## 2020-01-14 DIAGNOSIS — I4891 Unspecified atrial fibrillation: Secondary | ICD-10-CM

## 2020-01-14 DIAGNOSIS — W19XXXD Unspecified fall, subsequent encounter: Secondary | ICD-10-CM | POA: Diagnosis present

## 2020-01-14 DIAGNOSIS — Z86718 Personal history of other venous thrombosis and embolism: Secondary | ICD-10-CM

## 2020-01-14 DIAGNOSIS — K650 Generalized (acute) peritonitis: Secondary | ICD-10-CM | POA: Diagnosis not present

## 2020-01-14 DIAGNOSIS — S5292XA Unspecified fracture of left forearm, initial encounter for closed fracture: Secondary | ICD-10-CM

## 2020-01-14 DIAGNOSIS — G629 Polyneuropathy, unspecified: Secondary | ICD-10-CM | POA: Diagnosis present

## 2020-01-14 DIAGNOSIS — R5381 Other malaise: Principal | ICD-10-CM | POA: Diagnosis present

## 2020-01-14 DIAGNOSIS — D500 Iron deficiency anemia secondary to blood loss (chronic): Secondary | ICD-10-CM | POA: Diagnosis present

## 2020-01-14 DIAGNOSIS — Z825 Family history of asthma and other chronic lower respiratory diseases: Secondary | ICD-10-CM | POA: Diagnosis not present

## 2020-01-14 DIAGNOSIS — S52502S Unspecified fracture of the lower end of left radius, sequela: Secondary | ICD-10-CM | POA: Diagnosis not present

## 2020-01-14 DIAGNOSIS — Z6824 Body mass index (BMI) 24.0-24.9, adult: Secondary | ICD-10-CM

## 2020-01-14 DIAGNOSIS — Z881 Allergy status to other antibiotic agents status: Secondary | ICD-10-CM | POA: Diagnosis not present

## 2020-01-14 DIAGNOSIS — Z90710 Acquired absence of both cervix and uterus: Secondary | ICD-10-CM

## 2020-01-14 DIAGNOSIS — Y92239 Unspecified place in hospital as the place of occurrence of the external cause: Secondary | ICD-10-CM | POA: Diagnosis not present

## 2020-01-14 DIAGNOSIS — I482 Chronic atrial fibrillation, unspecified: Secondary | ICD-10-CM | POA: Diagnosis present

## 2020-01-14 DIAGNOSIS — M8580 Other specified disorders of bone density and structure, unspecified site: Secondary | ICD-10-CM | POA: Diagnosis not present

## 2020-01-14 DIAGNOSIS — S52502D Unspecified fracture of the lower end of left radius, subsequent encounter for closed fracture with routine healing: Secondary | ICD-10-CM | POA: Diagnosis not present

## 2020-01-14 DIAGNOSIS — D649 Anemia, unspecified: Secondary | ICD-10-CM | POA: Diagnosis not present

## 2020-01-14 DIAGNOSIS — E43 Unspecified severe protein-calorie malnutrition: Secondary | ICD-10-CM

## 2020-01-14 DIAGNOSIS — S2242XA Multiple fractures of ribs, left side, initial encounter for closed fracture: Secondary | ICD-10-CM

## 2020-01-14 DIAGNOSIS — K219 Gastro-esophageal reflux disease without esophagitis: Secondary | ICD-10-CM | POA: Diagnosis not present

## 2020-01-14 DIAGNOSIS — N1831 Chronic kidney disease, stage 3a: Secondary | ICD-10-CM | POA: Diagnosis present

## 2020-01-14 DIAGNOSIS — E669 Obesity, unspecified: Secondary | ICD-10-CM | POA: Diagnosis present

## 2020-01-14 DIAGNOSIS — Z933 Colostomy status: Secondary | ICD-10-CM

## 2020-01-14 DIAGNOSIS — I4821 Permanent atrial fibrillation: Secondary | ICD-10-CM | POA: Diagnosis not present

## 2020-01-14 DIAGNOSIS — I4819 Other persistent atrial fibrillation: Secondary | ICD-10-CM

## 2020-01-14 DIAGNOSIS — I10 Essential (primary) hypertension: Secondary | ICD-10-CM | POA: Diagnosis not present

## 2020-01-14 DIAGNOSIS — S52502A Unspecified fracture of the lower end of left radius, initial encounter for closed fracture: Secondary | ICD-10-CM

## 2020-01-14 DIAGNOSIS — G8918 Other acute postprocedural pain: Secondary | ICD-10-CM

## 2020-01-14 DIAGNOSIS — R001 Bradycardia, unspecified: Secondary | ICD-10-CM | POA: Diagnosis present

## 2020-01-14 LAB — BASIC METABOLIC PANEL
Anion gap: 12 (ref 5–15)
BUN: 11 mg/dL (ref 8–23)
CO2: 23 mmol/L (ref 22–32)
Calcium: 8.8 mg/dL — ABNORMAL LOW (ref 8.9–10.3)
Chloride: 106 mmol/L (ref 98–111)
Creatinine, Ser: 1.05 mg/dL — ABNORMAL HIGH (ref 0.44–1.00)
GFR calc Af Amer: 53 mL/min — ABNORMAL LOW (ref >60)
GFR calc non Af Amer: 45 mL/min — ABNORMAL LOW (ref >60)
Glucose, Bld: 91 mg/dL (ref 70–99)
Potassium: 3.8 mmol/L (ref 3.5–5.1)
Sodium: 141 mmol/L (ref 135–145)

## 2020-01-14 LAB — CBC
HCT: 34.1 % — ABNORMAL LOW (ref 36.0–46.0)
Hemoglobin: 10.9 g/dL — ABNORMAL LOW (ref 12.0–15.0)
MCH: 30 pg (ref 26.0–34.0)
MCHC: 32 g/dL (ref 30.0–36.0)
MCV: 93.9 fL (ref 80.0–100.0)
Platelets: 609 10*3/uL — ABNORMAL HIGH (ref 150–400)
RBC: 3.63 MIL/uL — ABNORMAL LOW (ref 3.87–5.11)
RDW: 15.5 % (ref 11.5–15.5)
WBC: 12.3 10*3/uL — ABNORMAL HIGH (ref 4.0–10.5)
nRBC: 0 % (ref 0.0–0.2)

## 2020-01-14 LAB — MAGNESIUM: Magnesium: 2 mg/dL (ref 1.7–2.4)

## 2020-01-14 MED ORDER — PREDNISONE 5 MG PO TABS
10.0000 mg | ORAL_TABLET | Freq: Every day | ORAL | Status: DC
  Administered 2020-01-15 – 2020-01-20 (×6): 10 mg via ORAL
  Filled 2020-01-14 (×6): qty 2

## 2020-01-14 MED ORDER — METOPROLOL TARTRATE 25 MG PO TABS
25.0000 mg | ORAL_TABLET | Freq: Two times a day (BID) | ORAL | Status: DC
  Administered 2020-01-14 – 2020-01-20 (×12): 25 mg via ORAL
  Filled 2020-01-14 (×12): qty 1

## 2020-01-14 MED ORDER — PROCHLORPERAZINE 25 MG RE SUPP: 12.5000 mg | Freq: Four times a day (QID) | RECTAL | Status: DC | PRN

## 2020-01-14 MED ORDER — OXYCODONE HCL 5 MG PO TABS
5.0000 mg | ORAL_TABLET | ORAL | Status: DC | PRN
  Administered 2020-01-15 – 2020-01-18 (×4): 5 mg via ORAL
  Filled 2020-01-14 (×4): qty 1
  Filled 2020-01-14: qty 2
  Filled 2020-01-14 (×2): qty 1

## 2020-01-14 MED ORDER — AMOXICILLIN-POT CLAVULANATE 875-125 MG PO TABS
1.0000 | ORAL_TABLET | Freq: Two times a day (BID) | ORAL | Status: AC
  Administered 2020-01-14 – 2020-01-18 (×9): 1 via ORAL
  Filled 2020-01-14 (×9): qty 1

## 2020-01-14 MED ORDER — SACCHAROMYCES BOULARDII 250 MG PO CAPS
250.0000 mg | ORAL_CAPSULE | Freq: Two times a day (BID) | ORAL | Status: DC
  Administered 2020-01-14 – 2020-01-20 (×12): 250 mg via ORAL
  Filled 2020-01-14 (×12): qty 1

## 2020-01-14 MED ORDER — AMIODARONE HCL 200 MG PO TABS
200.0000 mg | ORAL_TABLET | Freq: Every day | ORAL | Status: DC
  Administered 2020-01-15: 200 mg via ORAL
  Filled 2020-01-14: qty 1

## 2020-01-14 MED ORDER — PROCHLORPERAZINE MALEATE 5 MG PO TABS: 5.0000 mg | ORAL_TABLET | Freq: Four times a day (QID) | ORAL | Status: DC | PRN

## 2020-01-14 MED ORDER — LIDOCAINE HCL URETHRAL/MUCOSAL 2 % EX GEL: CUTANEOUS | Status: DC | PRN

## 2020-01-14 MED ORDER — TRAZODONE HCL 50 MG PO TABS
25.0000 mg | ORAL_TABLET | Freq: Every evening | ORAL | Status: DC | PRN
  Administered 2020-01-19: 50 mg via ORAL
  Filled 2020-01-14: qty 1

## 2020-01-14 MED ORDER — POLYETHYLENE GLYCOL 3350 17 G PO PACK: 17.0000 g | PACK | Freq: Every day | ORAL | Status: DC | PRN

## 2020-01-14 MED ORDER — ASCORBIC ACID 500 MG PO TABS
250.0000 mg | ORAL_TABLET | Freq: Two times a day (BID) | ORAL | Status: DC
  Administered 2020-01-14 – 2020-01-20 (×12): 250 mg via ORAL
  Filled 2020-01-14 (×12): qty 1

## 2020-01-14 MED ORDER — PRO-STAT SUGAR FREE PO LIQD
30.0000 mL | Freq: Two times a day (BID) | ORAL | Status: DC
  Administered 2020-01-15 – 2020-01-20 (×11): 30 mL via ORAL
  Filled 2020-01-14 (×12): qty 30

## 2020-01-14 MED ORDER — ALUM & MAG HYDROXIDE-SIMETH 200-200-20 MG/5ML PO SUSP: 30.0000 mL | ORAL | Status: DC | PRN

## 2020-01-14 MED ORDER — DILTIAZEM HCL ER COATED BEADS 120 MG PO CP24
120.0000 mg | ORAL_CAPSULE | Freq: Every day | ORAL | Status: DC
  Administered 2020-01-15 – 2020-01-20 (×6): 120 mg via ORAL
  Filled 2020-01-14 (×6): qty 1

## 2020-01-14 MED ORDER — BISACODYL 10 MG RE SUPP: 10.0000 mg | Freq: Every day | RECTAL | Status: DC | PRN

## 2020-01-14 MED ORDER — BETHANECHOL CHLORIDE 5 MG PO TABS
5.0000 mg | ORAL_TABLET | Freq: Three times a day (TID) | ORAL | Status: DC
  Administered 2020-01-14: 5 mg via ORAL
  Filled 2020-01-14 (×3): qty 1

## 2020-01-14 MED ORDER — LEVOTHYROXINE SODIUM 50 MCG PO TABS
50.0000 ug | ORAL_TABLET | Freq: Every day | ORAL | Status: DC
  Administered 2020-01-15 – 2020-01-20 (×6): 50 ug via ORAL
  Filled 2020-01-14 (×6): qty 1

## 2020-01-14 MED ORDER — SENNOSIDES-DOCUSATE SODIUM 8.6-50 MG PO TABS: 2.0000 | ORAL_TABLET | Freq: Every evening | ORAL | Status: DC | PRN

## 2020-01-14 MED ORDER — BETHANECHOL CHLORIDE 10 MG PO TABS
5.0000 mg | ORAL_TABLET | Freq: Three times a day (TID) | ORAL | Status: DC
  Administered 2020-01-14 – 2020-01-20 (×17): 5 mg via ORAL
  Filled 2020-01-14 (×17): qty 1

## 2020-01-14 MED ORDER — DEXTROSE-NACL 5-0.45 % IV SOLN: INTRAVENOUS | Status: DC

## 2020-01-14 MED ORDER — APIXABAN 5 MG PO TABS
5.0000 mg | ORAL_TABLET | Freq: Two times a day (BID) | ORAL | Status: DC
  Administered 2020-01-14 – 2020-01-20 (×12): 5 mg via ORAL
  Filled 2020-01-14 (×12): qty 1

## 2020-01-14 MED ORDER — PANTOPRAZOLE SODIUM 40 MG PO TBEC
40.0000 mg | DELAYED_RELEASE_TABLET | Freq: Every day | ORAL | Status: DC
  Administered 2020-01-15 – 2020-01-19 (×5): 40 mg via ORAL
  Filled 2020-01-14 (×5): qty 1

## 2020-01-14 MED ORDER — ENOXAPARIN SODIUM 40 MG/0.4ML ~~LOC~~ SOLN: 40.0000 mg | SUBCUTANEOUS | Status: DC

## 2020-01-14 MED ORDER — DIPHENHYDRAMINE HCL 12.5 MG/5ML PO ELIX: 12.5000 mg | ORAL_SOLUTION | Freq: Four times a day (QID) | ORAL | Status: DC | PRN

## 2020-01-14 MED ORDER — PROCHLORPERAZINE EDISYLATE 10 MG/2ML IJ SOLN: 5.0000 mg | Freq: Four times a day (QID) | INTRAMUSCULAR | Status: DC | PRN

## 2020-01-14 MED ORDER — GUAIFENESIN-DM 100-10 MG/5ML PO SYRP: 5.0000 mL | ORAL_SOLUTION | Freq: Four times a day (QID) | ORAL | Status: DC | PRN

## 2020-01-14 MED ORDER — BETHANECHOL CHLORIDE 5 MG PO TABS: 5.0000 mg | ORAL_TABLET | Freq: Three times a day (TID) | ORAL | Status: DC

## 2020-01-14 MED ORDER — ADULT MULTIVITAMIN W/MINERALS CH
1.0000 | ORAL_TABLET | Freq: Every day | ORAL | Status: DC
  Administered 2020-01-14 – 2020-01-20 (×7): 1 via ORAL
  Filled 2020-01-14 (×7): qty 1

## 2020-01-14 MED ORDER — FLEET ENEMA 7-19 GM/118ML RE ENEM: 1.0000 | ENEMA | Freq: Once | RECTAL | Status: DC | PRN

## 2020-01-14 MED ORDER — VITAMIN A 10000 UNITS PO CAPS
10000.0000 [IU] | ORAL_CAPSULE | Freq: Every day | ORAL | Status: DC
  Administered 2020-01-15 – 2020-01-20 (×6): 10000 [IU] via ORAL
  Filled 2020-01-14 (×7): qty 1

## 2020-01-14 MED ORDER — ACETAMINOPHEN 325 MG PO TABS
325.0000 mg | ORAL_TABLET | ORAL | Status: DC | PRN
  Administered 2020-01-15 – 2020-01-19 (×8): 650 mg via ORAL
  Filled 2020-01-14 (×8): qty 2

## 2020-01-14 NOTE — H&P (Signed)
Physical Medicine and Rehabilitation Admission H&P    Chief Complaint  Patient presents with  . Debility.    HPI: [name] is a 86 year old female with history of HTN, melanoma, peripheral neuropathy, HTN, CKD, left hip pain, PMR- new diagnosis, who was admitted on 12/29/2019 with nausea, vomiting, diarrhea, and dizziness resulting in a fall with left wrist pain.  History taken from chart review and patient.  Work-up revealed AKI on CKD with left rib and distal radius fracture proximal to prior ORIF.  Echocardiogram showed ejection fraction of 50%  with apical septal and apical anterior hypokinesis.  Dr. Angelena Form was consulted for input and felt that dizziness was due to dehydration and no further cardiac work-up needed.  She underwent removal of left distal radius hardware with internal fixation of left radial shaft fracture by Dr. Caralyn Guile on 02/20 and postop to be NWB on LUE.  She developed abdominal pain due to perorated sigmoid diverticulitis with pneumoperitoneum and required exploratory lap with sigmoid colectomy and end colostomy by Dr. Redmond Pulling on 01/06/2020.  Hospital course further complicated by urinary retention and a Foley was placed for decompression.  She developed atrial fibrillation with RVR on 02/26 and treated with BB and IV heparin  Per Dr. Doylene Canard.  Treated with IV antibiotics for gross purulent peritonitis and leukocytosis being monitored.  Follow-up CT done on 03/02 due to rise in WBC to 20.3 and showed  2 fluid collection in LLQ right posterior pelvic.   IR consulted for input and felt that small collections were due to blood rather than abscess and recommended repeat CT abdomen in 72 hours.  She was cleared to transition to Eliquis and Zosyn changed to Augmentin X 7 days as leukocytosis was resolving. CCS felt that repeat CT abdomen not needed. She has been tolerating full liquids with decrease in abdominal pain-->diet advanced to soft's today. She continues to have bouts  of A flutter and Cardizem added on 03/04. Therapy ongoing and CIR recommended due to functional decline.  Please see preadmission assessment from earlier today as well.   Review of Systems  Constitutional: Positive for malaise/fatigue.  Gastrointestinal: Positive for abdominal pain.  Musculoskeletal: Positive for joint pain and myalgias.  Neurological: Positive for focal weakness.  All other systems reviewed and are negative.    Past Medical History:  Diagnosis Date  . DVT (deep venous thrombosis) ([place]) 09/2015   RLE  . GERD (gastroesophageal reflux disease)   . Hypertension   . Hypothyroidism   . 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 Peripheral vascular disease ([place])    peripheral neuropathy    Past Surgical History:  Procedure Laterality Date  . ABDOMINAL HYSTERECTOMY    . APPENDECTOMY    . BACK SURGERY    . BREAST SURGERY     left breast biopsy  . COLOSTOMY N/A 01/06/2020   Procedure: Colostomy;  Surgeon: Greer Pickerel, MD;  Location: [facility];  Service: General;  Laterality: N/A;  . EYE SURGERY     cataract  . HARDWARE REMOVAL Left 01/01/2020   Procedure: HARDWARE REMOVAL;  Surgeon: Iran Planas, MD;  Location: [facility];  Service: Orthopedics;  Laterality: Left;  . LAPAROTOMY N/A 01/06/2020   Procedure: Exploratory Laparotomy, Open Sigmoid colectomy;  Surgeon: Greer Pickerel, MD;  Location: [facility];  Service: General;  Laterality: N/A;  . LIPOMA EXCISION Left 04/25/2015   Procedure: EXCISION OF LIPOMA LEFT ARM;  Surgeon: Donnie Mesa, MD;  Location: [facility];  Service: General;  Laterality: Left;  . LUMBAR LAMINECTOMY/DECOMPRESSION MICRODISCECTOMY  11/20/2011   Procedure: LUMBAR LAMINECTOMY/DECOMPRESSION MICRODISCECTOMY;  Surgeon: Jeneen Rinks P [place];  Location: [facility];  Service: Orthopedics;  Laterality: N/A;  Decompressive Laminectomy L2 to the Sacrum (X-Ray)  . OPEN REDUCTION INTERNAL FIXATION (ORIF) DISTAL RADIAL FRACTURE Left  01/01/2020   Procedure: OPEN REDUCTION INTERNAL FIXATION (ORIF) DISTAL RADIAL FRACTURE;  Surgeon: Iran Planas, MD;  Location: [facility];  Service: Orthopedics;  Laterality: Left;  . OTHER SURGICAL HISTORY     left wrist surgery - has plate in left wrist   . OTHER SURGICAL HISTORY     right knee surgery due to torn cartilage   . TONSILLECTOMY    . WRIST FRACTURE SURGERY Left     Family History  Problem Relation Age of Onset  . Cancer Mother        BREAST  . COPD Father   . Emphysema Father   . Cancer Daughter        breast   Social History: Lives alone.   reports that she has never smoked. She has never used smokeless tobacco. She reports that she does not drink alcohol or use drugs.    Allergies:  Allergies  Allergen Reactions  . Codeine Other (See Comments)    Nightmares, imagining things  . Keflex [Cephalexin] Nausea Only    Pt ended up in ER w/ CP    Medications Prior to Admission  Medication Sig Dispense Refill  . amLODipine (NORVASC) 10 MG tablet TAKE 1 TABLET BY MOUTH EVERY DAY (Patient taking differently: Take 10 mg by mouth daily. ) 90 tablet 1  . gabapentin (NEURONTIN) 300 MG capsule TAKE 2 CAPSULES BY MOUTH AT BEDTIME (Patient taking differently: Take 600 mg by mouth at bedtime. ) 180 capsule 1  . Ibuprofen-Acetaminophen (ADVIL DUAL ACTION) 125-250 MG TABS Take 2 tablets by mouth as needed (pain).    Marland Kitchen levothyroxine (SYNTHROID) 50 MCG tablet TAKE 1 TABLET BY MOUTH EVERY DAY (Patient taking differently: Take 50 mcg by mouth daily. ) 90 tablet 1  . Multiple Vitamins-Minerals (PRESERVISION AREDS 2) CAPS Take 1 capsule by mouth daily.     . nadolol (CORGARD) 80 MG tablet TAKE 1 TABLET BY MOUTH EVERY DAY (Patient taking differently: Take 80 mg by mouth daily. ) 90 tablet 1  . pantoprazole (PROTONIX) 40 MG tablet TAKE 1 TABLET BY MOUTH EVERY DAY (Patient taking differently: Take 40 mg by mouth daily. ) 90 tablet 1  . predniSONE (DELTASONE) 5 MG tablet Take 10 mg by mouth  daily with breakfast.       Drug Regimen Review  Drug regimen was reviewed and remains appropriate with no significant issues identified  Home: Home Living Family/patient expects to be discharged to:: Private residence Living Arrangements: Alone Available Help at Discharge: Family, Available 24 hours/day Type of Home: House Home Access: Stairs to enter [employer] of Steps: 1 Entrance Stairs-Rails: None Home Layout: One level Bathroom Shower/Tub: Multimedia programmer: Handicapped height Home Equipment: Environmental consultant - 2 wheels, Grab bars - tub/shower Additional Comments: Son and daughter who lives nextdoor   Functional History: Prior Function Level of Independence: Independent Comments: ADLs, IADLs, driving. Enjoys reading and goes for walks. Was going to OP PT for LLE injury (pulled hip tendon)  Functional Status:  Mobility: Bed Mobility Overal bed mobility: Needs Assistance Bed Mobility: Supine to Sit Rolling: Min assist Sidelying to sit: Min assist Supine to sit: Supervision Sit  to sidelying: Min assist General bed mobility comments: no physical assist, HOB up Transfers Overall transfer level: Needs assistance Equipment used: Left platform walker Transfers: Sit to/from Stand Sit to Stand: Mod assist Stand pivot transfers: Min assist General transfer comment: assist to rise and steady Ambulation/Gait Ambulation/Gait assistance: Min assist Gait Distance (Feet): 10 Feet Assistive device: Left platform walker Gait Pattern/deviations: Step-through pattern, Drifts right/left, Decreased step length - left, Decreased dorsiflexion - left, Narrow base of support, Trunk flexed, Decreased stride length General Gait Details: Min assist for steadying, guiding pt and RW. Verbal cuing for correct placement of self in RW, placement of LUE. Gait velocity: decr Gait velocity interpretation: <1.31 ft/sec, indicative of household ambulator    ADL: ADL Overall ADL's  : Needs assistance/impaired Eating/Feeding: Set up, Sitting Eating/Feeding Details (indicate cue type and reason): Using one hand. Will need assistance for bilateral tasks Grooming: Oral care, Sitting, Set up, Brushing hair Grooming Details (indicate cue type and reason): seated in recliner Upper Body Bathing: Moderate assistance, Sitting Lower Body Bathing: Moderate assistance Lower Body Bathing Details (indicate cue type and reason): Pt performing peri care at sink with Min A for balance; requiring increased assistance for distal areas Upper Body Dressing : Moderate assistance, Sitting Upper Body Dressing Details (indicate cue type and reason): dressing L UE first Lower Body Dressing: Moderate assistance, Sit to/from stand Lower Body Dressing Details (indicate cue type and reason): crossed foot over opposite knee to pull up socks Toilet Transfer: Minimal assistance, Ambulation, BSC, RW Toilet Transfer Details (indicate cue type and reason): Simulated with recliner, more so for equipment and "woozy feeling" Toileting- Clothing Manipulation and Hygiene: Sitting/lateral lean, Min guard Functional mobility during ADLs: Minimal assistance, Rolling walker General ADL Comments: Pt set with ADLs in chair, however unable to progress due to continued nausea (pt is min-mod to complete functional ADLs in standing)  Cognition: Cognition Overall Cognitive Status: Within Functional Limits for tasks assessed Orientation Level: Oriented X4 Cognition Arousal/Alertness: Awake/alert Behavior During Therapy: WFL for tasks assessed/performed Overall Cognitive Status: Within Functional Limits for tasks assessed General Comments: increased time and effort to mobilize, secondary to nausea  Physical Exam: Blood pressure (!) 142/68, pulse 89, temperature 97.9 F (36.6 C), temperature source Oral, resp. rate 17, height 5\' 6"  (1.676 m), weight 74.8 kg, SpO2 95 %. Physical Exam  Vitals reviewed. Constitutional:  She appears well-developed.  Obese  HENT:  Head: Normocephalic and atraumatic.  Eyes: EOM are normal. Right eye exhibits no discharge. Left eye exhibits no discharge.  Neck: No tracheal deviation present. No thyromegaly present.  Respiratory: Effort normal. No respiratory distress.  GI: Soft. She exhibits no distension.  + Ostomy  Musculoskeletal:     Comments: Left upper extremity with edema  Neurological: She is alert.  Motor: Right upper extremity: 4/5 proximally, 5/5 handgrip  Left upper extremity: Proximally limited by cast, handgrip 4+/5 Bilateral lower extremities: Hip flexion, knee extension 4 medicine/5, ankle dorsiflexion 5/5  Skin:  Left upper extremity with dressing C/D/I  Psychiatric: She has a normal mood and affect. Her behavior is normal.    Results for orders placed or performed during the hospital encounter of 12/29/19 (from the past 48 hour(s))  Basic metabolic panel     Status: Abnormal   Collection Time: 01/13/20  1:06 AM  Result Value Ref Range   Sodium 142 135 - 145 mmol/L   Potassium 3.7 3.5 - 5.1 mmol/L   Chloride 106 98 - 111 mmol/L   CO2 25 22 -  32 mmol/L   Glucose, Bld 92 70 - 99 mg/dL    Comment: Glucose reference range applies only to samples taken after fasting for at least 8 hours.   BUN 10 8 - 23 mg/dL   Creatinine, Ser 0.83 0.44 - 1.00 mg/dL   Calcium 8.6 (L) 8.9 - 10.3 mg/dL   GFR calc non Af Amer >60 >60 mL/min   GFR calc Af Amer >60 >60 mL/min   Anion gap 11 5 - 15    Comment: Performed at [facility] [address] [place], [place] [address]  CBC     Status: Abnormal   Collection Time: 01/13/20  1:06 AM  Result Value Ref Range   WBC 12.8 (H) 4.0 - 10.5 K/uL   RBC 3.51 (L) 3.87 - 5.11 MIL/uL   Hemoglobin 10.6 (L) 12.0 - 15.0 g/dL   HCT 33.0 (L) 36.0 - 46.0 %   MCV 94.0 80.0 - 100.0 fL   MCH 30.2 26.0 - 34.0 pg   MCHC 32.1 30.0 - 36.0 g/dL   RDW 15.5 11.5 - 15.5 %   Platelets 567 (H) 150 - 400 K/uL   nRBC 0.0 0.0 - 0.2 %     Comment: Performed at [facility], [place] [address] [place], [place] [address]  Magnesium     Status: None   Collection Time: 01/13/20  1:06 AM  Result Value Ref Range   Magnesium 1.9 1.7 - 2.4 mg/dL    Comment: Performed at [facility] [address], [place], [place] [id number]  Basic metabolic panel     Status: Abnormal   Collection Time: 01/14/20  1:00 AM  Result Value Ref Range   Sodium 141 135 - 145 mmol/L   Potassium 3.8 3.5 - 5.1 mmol/L   Chloride 106 98 - 111 mmol/L   CO2 23 22 - 32 mmol/L   Glucose, Bld 91 70 - 99 mg/dL    Comment: Glucose reference range applies only to samples taken after fasting for at least 8 hours.   BUN 11 8 - 23 mg/dL   Creatinine, Ser 1.05 (H) 0.44 - 1.00 mg/dL   Calcium 8.8 (L) 8.9 - 10.3 mg/dL   GFR calc non Af Amer 45 (L) >60 mL/min   GFR calc Af Amer 53 (L) >60 mL/min   Anion gap 12 5 - 15    Comment: Performed at [facility] [address] [place], [place] [address]  CBC     Status: Abnormal   Collection Time: 01/14/20  1:00 AM  Result Value Ref Range   WBC 12.3 (H) 4.0 - 10.5 K/uL   RBC 3.63 (L) 3.87 - 5.11 MIL/uL   Hemoglobin 10.9 (L) 12.0 - 15.0 g/dL   HCT 34.1 (L) 36.0 - 46.0 %   MCV 93.9 80.0 - 100.0 fL   MCH 30.0 26.0 - 34.0 pg   MCHC 32.0 30.0 - 36.0 g/dL   RDW 15.5 11.5 - 15.5 %   Platelets 609 (H) 150 - 400 K/uL   nRBC 0.0 0.0 - 0.2 %    Comment: Performed at [facility] [address] [place], [place] [address]  Magnesium     Status: None   Collection Time: 01/14/20  1:00 AM  Result Value Ref Range   Magnesium 2.0 1.7 - 2.4 mg/dL    Comment: Performed at [facility], [place] [address], [place], [place] [address]   No results found.  Medical Problem List and Plan: 1.  Deficits with mobility, transfers, self-care secondary to multi-- Ortho.  -patient may not shower  -ELOS/Goals: 4-7 days/supervision/min a  Admit to CIR 2.  Antithrombotics: -DVT/anticoagulation:  Pharmaceutical:  Other (comment)--Elquis  -antiplatelet therapy: N/A 3. Pain Management: Continue Oxycodone prn  Monitor with increased exertion. 4. Mood: LCSW to follow for evaluation and support.   -antipsychotic agents: N/a 5. Neuropsych: This patient is capable of making decisions on her own behalf. 6. Skin/Wound Care: Wet to dry dressings daily. Add vitamin C/multivitamin to promote wound healing.  7. Fluids/Electrolytes/Nutrition: Started on soft diet.  Advance diet as tolerated offer supplements between meals.  8.  Left wrist fracture s/p internal fixation: NWB LUE--long arm splint X 2 weeks followed by suture removal and long arm cast for  immobilization X 2 additional weeks.   9. Perforated diverticulum s/p repair with colostomy:  Family refusing education and patient unable to perform care due to right wrist fracture.   Need to  Educate/encourage patient and family on care.  10. Purulent peritonitis: Zosyn changed to Augmentin X 7 days. CCS does not feel repeat CT abdomen/pelvis needed.  11. New onset A fib with RVR: ON amiodarone, metoprolol and Cardizem (added on 03/4).   Monitor with increased activity 12. Polymyalgia rheumatica: On prednisone--on vitamin A to mitigate steroid effect on wound healing.  13. Urinary retention: Monitor for retention.  PVRs ordered. 14. Hypothyroid: Continue Synthroid.    Bary Leriche, PA-C 01/14/2020  I have personally performed a face to face diagnostic evaluation, including, but not limited to relevant history and physical exam findings, of this patient and developed relevant assessment and plan.  Additionally, I have reviewed and concur with the physician assistant's documentation above.  Delice Lesch, MD, ABPMR  The patient's status has not changed. The original post admission physician evaluation remains appropriate, and any changes from the pre-admission screening or documentation from the acute chart are noted above.   Delice Lesch, MD, ABPMR

## 2020-01-14 NOTE — Consult Note (Signed)
[facility] Nurse ostomy follow up Patient receiving care in [facility] 2W02. Stoma type/location: LUQ colostomy Stomal assessment/size: deferred Peristomal assessment: deferred Treatment options for stomal/peristomal skin: barrier ring Output:  Soft brown stool in existing pouch.  Pouch very recently place, no signs of washout or impending leakage. Supplies in room.  Ostomy pouching: 1pc. flat  Education provided: none Enrolled patient in [employer] Discharge program: Yes, previously Val Riles, RN, MSN, CWOCN, CNS-BC, pager [phone]

## 2020-01-14 NOTE — Progress Notes (Addendum)
ANTICOAGULATION CONSULT NOTE - Initial Consult  Pharmacy Consult for Apixaban Indication: Nonvalvular Atrial Fibrillation  Allergies  Allergen Reactions  . Codeine Other (See Comments)    Nightmares, imagining things  . Keflex [Cephalexin] Nausea Only    Pt ended up in ER w/ CP    Patient Measurements: Height: 66 inches Weight: 74.8 kg   Vital Signs: Temp: 98.3 F (36.8 C) (03/05 1608) Temp Source: Oral (03/05 1608) BP: 100/55 (03/05 1608) Pulse Rate: 79 (03/05 1608)  Labs: Recent Labs    01/12/20 0527 01/12/20 0527 01/13/20 0106 01/14/20 0100  HGB 10.3*   < > 10.6* 10.9*  HCT 32.9*  --  33.0* 34.1*  PLT 556*  --  567* 609*  CREATININE 0.88  --  0.83 1.05*   < > = values in this interval not displayed.    Estimated Creatinine Clearance: 33.9 mL/min (A) (by C-G formula based on SCr of 1.05 mg/dL (H)).   Medical History: Past Medical History:  Diagnosis Date  . DVT (deep venous thrombosis) ([place]) 09/2015   RLE  . GERD (gastroesophageal reflux disease)   . Hypertension   . Hypothyroidism   . 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 Peripheral vascular disease ([facility])    peripheral neuropathy    Assessment: 86 year old female with new onset a fib with RVR, on amiodarone, metoprolol and diltiazem. Pharmacy is consulted to dose apixaban for nonvalvular a fib (pt was on apixaban 5 mg PO BID prior to transfer to CIR).  H/H 10.9/34.1, platelets 609; Scr 1.05; TBW CrCl ~37 ml/min  Goal of Therapy:  Prevention of stroke due to a fib Monitor platelets by anticoagulation protocol: Yes   Plan:  Continue apixaban 5 mg PO BID Monitor daily CBC; monitor renal function Monitor for signs/symptoms of bleeding  Gillermina Hu, PharmD, BCPS, [facility] Clinical Pharmacist 01/14/2020,4:31 PM

## 2020-01-14 NOTE — TOC Transition Note (Signed)
Transition of Care [facility]) - CM/SW Discharge Note   Patient Details  Name: [name] MRN: [id number] Date of Birth: 06-27-1923  [facility] CM/SW Contact:  Pollie Friar, RN Phone Number: 01/14/2020, 10:13 AM   Clinical Narrative:    Pt discharging to CIR today. CM signing off.    Final next level of care: IP Rehab Facility Barriers to Discharge: No Barriers Identified   Patient Goals and CMS Choice Patient states their goals for this hospitalization and ongoing recovery are:: Patient desires to discharge home once medically stable with family support and [facility] services CMS Medicare.gov Compare Post Acute Care list provided to:: Patient Represenative (must comment)(Provided patient/daughter with Medicare.gov HH list) Choice offered to / list presented to : Patient, Adult Children  Discharge Placement                       Discharge Plan and Services In-house Referral: Clinical Social Work Discharge Planning Services: CM Consult                                 Social Determinants of Health (SDOH) Interventions     Readmission Risk Interventions No flowsheet data found.

## 2020-01-14 NOTE — Progress Notes (Signed)
Patient admitted to rehab from 2W. Patient alert and oriented x4. Patient welcomed to rehab, all medications and rehab schedule was discussed. Rehab policy and expectations discussed. Patient aware and verbally states understanding.

## 2020-01-14 NOTE — Consult Note (Signed)
Ref: [name], MD   Subjective:  Monitor: Atrial fibrillation. HR now in 70's and 80's. Her diet has been advanced to soft diet.  Objective:  Vital Signs in the last 24 hours: Temp:  [97.9 F (36.6 C)-98.1 F (36.7 C)] 97.9 F (36.6 C) (03/05 0734) Pulse Rate:  [79-89] 89 (03/05 0734) Cardiac Rhythm: Atrial fibrillation (03/04 1900) Resp:  [16-18] 17 (03/05 0734) BP: (101-142)/(60-80) 142/68 (03/05 0734) SpO2:  [95 %-98 %] 95 % (03/05 0734)  Physical Exam: BP Readings from Last 1 Encounters:  01/14/20 (!) 142/68     Wt Readings from Last 1 Encounters:  01/05/20 74.8 kg    Weight change:  Body mass index is 26.62 kg/m. HEENT: [place]/AT, Eyes-Blue, PERL, EOMI, Conjunctiva-Pale pink, Sclera-Non-icteric Neck: No JVD, No bruit, Trachea midline. Lungs:  Clear, Bilateral. Cardiac:  Regular rhythm, normal S1 and S2, no S3. II/VI systolic murmur. Abdomen:  Soft, mildly tender. BS present. Extremities:  No edema present. No cyanosis. No clubbing. CNS: AxOx3, Cranial nerves grossly intact, moves all 4 extremities.  Skin: Warm and dry.   Intake/Output from previous day: 03/04 0701 - 03/05 0700 In: 3 [I.V.:3] Out: 1075 [Urine:800; Stool:275]    Lab Results: BMET    Component Value Date/Time   NA 141 01/14/2020 0100   NA 142 01/13/2020 0106   NA 140 01/12/2020 0527   K 3.8 01/14/2020 0100   K 3.7 01/13/2020 0106   K 3.5 01/12/2020 0527   CL 106 01/14/2020 0100   CL 106 01/13/2020 0106   CL 104 01/12/2020 0527   CO2 23 01/14/2020 0100   CO2 25 01/13/2020 0106   CO2 25 01/12/2020 0527   GLUCOSE 91 01/14/2020 0100   GLUCOSE 92 01/13/2020 0106   GLUCOSE 84 01/12/2020 0527   BUN 11 01/14/2020 0100   BUN 10 01/13/2020 0106   BUN 11 01/12/2020 0527   CREATININE 1.05 (H) 01/14/2020 0100   CREATININE 0.83 01/13/2020 0106   CREATININE 0.88 01/12/2020 0527   CREATININE 0.95 (H) 01/26/2016 1552   CALCIUM 8.8 (L) 01/14/2020 0100   CALCIUM 8.6 (L) 01/13/2020 0106    CALCIUM 8.7 (L) 01/12/2020 0527   GFRNONAA 45 (L) 01/14/2020 0100   GFRNONAA >60 01/13/2020 0106   GFRNONAA 56 (L) 01/12/2020 0527   GFRAA 53 (L) 01/14/2020 0100   GFRAA >60 01/13/2020 0106   GFRAA >60 01/12/2020 0527   CBC    Component Value Date/Time   WBC 12.3 (H) 01/14/2020 0100   RBC 3.63 (L) 01/14/2020 0100   HGB 10.9 (L) 01/14/2020 0100   HCT 34.1 (L) 01/14/2020 0100   PLT 609 (H) 01/14/2020 0100   MCV 93.9 01/14/2020 0100   MCH 30.0 01/14/2020 0100   MCHC 32.0 01/14/2020 0100   RDW 15.5 01/14/2020 0100   LYMPHSABS 3.0 01/12/2020 0527   MONOABS 1.1 (H) 01/12/2020 0527   EOSABS 0.3 01/12/2020 0527   BASOSABS 0.1 01/12/2020 0527   HEPATIC Function Panel Recent Labs    06/22/19 1112 01/06/20 0454  PROT 6.6 4.9*   HEMOGLOBIN A1C No components found for: HGA1C,  MPG CARDIAC ENZYMES Lab Results  Component Value Date   TROPONINI <0.03 03/04/2016   BNP No results for input(s): PROBNP in the last 8760 hours. TSH Recent Labs    06/22/19 1112 01/07/20 1036  TSH 1.42 0.187*   CHOLESTEROL Recent Labs    06/22/19 1112  CHOL 190    Scheduled Meds: . acetaminophen  500 mg Oral Q6H  .  amiodarone  200 mg Oral Daily  . amoxicillin-clavulanate  1 tablet Oral Q12H  . apixaban  5 mg Oral BID  . bethanechol  5 mg Oral TID  . bupivacaine liposome  20 mL Infiltration Once  . Chlorhexidine Gluconate Cloth  6 each Topical Daily  . diltiazem  120 mg Oral Daily  . levothyroxine  50 mcg Oral QAC breakfast  . metoprolol tartrate  25 mg Oral BID  . pantoprazole  40 mg Oral Q1200  . polyethylene glycol  17 g Oral Daily  . predniSONE  10 mg Oral Q breakfast  . saccharomyces boulardii  250 mg Oral BID  . sodium chloride flush  3 mL Intravenous Q12H  . vitamin A  10,000 Units Oral Daily   Continuous Infusions: PRN Meds:.morphine injection, ondansetron (ZOFRAN) IV, oxyCODONE, polyethylene glycol, promethazine, senna-docusate  Assessment/Plan: Chronic atrial  fibrillation Acute perforated diverticulitis with peritonitis Abdominal pain, resolving S/P Hartmann's procedure Anemia of blood loss, stable HTN Hypothyroidism Polymyalgia Rheumatica  Continue soft diet as tolerated. May need encouragement with feeding. Continue oral medications   LOS: 16 days   Time spent including chart review, lab review, examination, discussion with patient : 25 min   Dixie Dials  MD  01/14/2020, 11:01 AM

## 2020-01-14 NOTE — Progress Notes (Signed)
[facility] Progress Note  8 Days Post-Op  Subjective: CC-  Up in chair this morning. States that yesterday was the best day she has had since surgery. She drank a smoothie and is tolerating full liquids. Denies n/v. Colostomy functioning. Appetite is improving and she would like to try solid food today. She did require I&O cath x1 over night. She has voided once this AM. Hoping to go to inpatient rehab today.  Objective: Vital signs in last 24 hours: Temp:  [97.9 F (36.6 C)-98.1 F (36.7 C)] 97.9 F (36.6 C) (03/05 0734) Pulse Rate:  [79-89] 89 (03/05 0734) Resp:  [16-18] 17 (03/05 0734) BP: (101-142)/(60-80) 142/68 (03/05 0734) SpO2:  [95 %-98 %] 95 % (03/05 0734) Last BM Date: 01/11/20(ostomy)  Intake/Output from previous day: 03/04 0701 - 03/05 0700 In: 3 [I.V.:3] Out: 1075 [Urine:800; Stool:275] Intake/Output this shift: Total I/O In: -  Out: 29 [Urine:50]  PE: Gen: Alert, NAD, pleasant HEENT: EOM's intact, pupils equal and round Pulm: rate and effort normal Abd: Soft, NT/ND, +BS,ostomy viable with liquid stool and air in pouch, open midline incision pale pink/ clean/ no erythema or drainage Psych: A&Ox3 Skin: no rashes noted, warm and dry   Lab Results:  Recent Labs    01/13/20 0106 01/14/20 0100  WBC 12.8* 12.3*  HGB 10.6* 10.9*  HCT 33.0* 34.1*  PLT 567* 609*   BMET Recent Labs    01/13/20 0106 01/14/20 0100  NA 142 141  K 3.7 3.8  CL 106 106  CO2 25 23  GLUCOSE 92 91  BUN 10 11  CREATININE 0.83 1.05*  CALCIUM 8.6* 8.8*   PT/INR No results for input(s): LABPROT, INR in the last 72 hours. CMP     Component Value Date/Time   NA 141 01/14/2020 0100   K 3.8 01/14/2020 0100   CL 106 01/14/2020 0100   CO2 23 01/14/2020 0100   GLUCOSE 91 01/14/2020 0100   BUN 11 01/14/2020 0100   CREATININE 1.05 (H) 01/14/2020 0100   CREATININE 0.95 (H) 01/26/2016 1552   CALCIUM 8.8 (L) 01/14/2020 0100   PROT 4.9 (L) 01/06/2020 0454   ALBUMIN 2.1 (L) 01/06/2020 0454   AST 14 (L) 01/06/2020 0454   ALT 13 01/06/2020 0454   ALKPHOS 64 01/06/2020 0454   BILITOT 1.6 (H) 01/06/2020 0454   GFRNONAA 45 (L) 01/14/2020 0100   GFRAA 53 (L) 01/14/2020 0100   Lipase     Component Value Date/Time   LIPASE 25.0 03/06/2016 1123       Studies/Results: No results found.  Anti-infectives: Anti-infectives (From admission, onward)   Start     Dose/Rate Route Frequency Ordered Stop   01/13/20 0000  amoxicillin-clavulanate (AUGMENTIN) 875-125 MG tablet  Status:  Discontinued     1 tablet Oral Every 12 hours 01/13/20 1039 01/13/20    01/13/20 0000  amoxicillin-clavulanate (AUGMENTIN) 875-125 MG tablet     1 tablet Oral Every 12 hours 01/13/20 1042 01/20/20 2359   01/12/20 1115  amoxicillin-clavulanate (AUGMENTIN) 875-125 MG per tablet 1 tablet     1 tablet Oral Every 12 hours 01/12/20 1104 01/17/20 0959   01/11/20 2030  piperacillin-tazobactam (ZOSYN) IVPB 3.375 g  Status:  Discontinued     3.375 g 12.5 mL/hr over 240 Minutes Intravenous Every 8 hours 01/11/20 1926 01/12/20 1104   01/06/20 0230  piperacillin-tazobactam (ZOSYN) IVPB 3.375 g     3.375 g 12.5 mL/hr over 240 Minutes Intravenous Every 8 hours 01/05/20 1952 01/10/20 2158  01/05/20 2000  piperacillin-tazobactam (ZOSYN) IVPB 3.375 g     3.375 g 100 mL/hr over 30 Minutes Intravenous  Once 01/05/20 1952 01/05/20 2049   01/01/20 2030  ceFAZolin (ANCEF) IVPB 1 g/50 mL premix  Status:  Discontinued     1 g 100 mL/hr over 30 Minutes Intravenous Every 8 hours 01/01/20 1307 01/03/20 1357   01/01/20 1315  ceFAZolin (ANCEF) IVPB 1 g/50 mL premix     1 g 100 mL/hr over 30 Minutes Intravenous NOW 01/01/20 1307 01/01/20 1454   01/01/20 0600  ceFAZolin (ANCEF) IVPB 2g/100 mL premix  Status:  Discontinued     2 g 200 mL/hr over 30 Minutes Intravenous To Short Stay 12/31/19 1928 01/01/20 1259   01/01/20 0600  vancomycin (VANCOCIN) IVPB 1000 mg/200 mL premix     1,000 mg 200  mL/hr over 60 Minutes Intravenous To Short Stay 12/31/19 1928 01/01/20 1055       Assessment/Plan HTN Hypothyroidism Hx DVT in 2016 GERD PVD Recent fall w/ left wrist fx s/pL wrist repair by Dr. Caralyn Guile on 2/20 - I called their office and asked them to evaluate LUE as she is now 2 weeks postop tomorow  POD#8 - Perforated sigmoid diverticulitis with purulent peritonitis - s/p ex lap,sigmoid colectomy with end colostomy by Dr. Redmond Pulling 01/06/2020. Monitor ostomy output. Wound is clean, decrease to once daily wet to dry dressing changes, WOC on board for ostomy teaching and dressing changes. Encourage ambulation, IS/pulm toilet.  CKD w/ AKI- GFR and creatinine normalized New onset Atrial fibrillation - remains in fib on amiodarone (started 2/27) and eliquis. Given age, co-morbidities, and h/o falls, would recommend risk/benefit discussion with patient and family. ABL anemia - monitor while on AC, currently stable FEN - advance to soft diet, continue to encourage nutritional supplementation and food from outside.  Endo - h/o Polymyalgia rheumaticaon PO Prednisone 10mg  daily. Vitamin A for mitigate steroid effects on wound healing. ID - on augmentin x7d for intra-abdominal abscess vs hematoma seen on CT 3/2.  Foley - defer to primary team, previous retention. Start urecholine TID DVT - SCDs, eliquis Follow-Up - Dr. Redmond Pulling Dispo - Ok for discharge to CIR from surgical standpoint. Discharge instructions regarding abdominal surgery and follow up info on AVS.   LOS: 16 days    Wellington Hampshire, [facility] Surgery 01/14/2020, 9:21 AM Please see Amion for pager number during day hours 7:00am-4:30pm

## 2020-01-14 NOTE — Progress Notes (Signed)
Inpatient Rehab Admissions Coordinator:   I have a bed available for pt to admit to CIR today. Dr. Reesa Chew in agreement.  Will let pt/family, and CM know.   Shann Medal, PT, DPT Admissions Coordinator [phone] 01/14/20  9:36 AM

## 2020-01-14 NOTE — Progress Notes (Signed)
PROGRESS NOTE    [name]  [id number] DOB: 05/12/1923 DOA: 12/29/2019 PCP: [name], MD   Brief Narrative:  Discharge summary done yesterday.   Assessment & Plan:   Principal Problem:   Acute renal failure superimposed on stage 3a chronic kidney disease (HCC) Active Problems:   Hypothyroidism   HTN (hypertension)   Preoperative cardiovascular examination   Closed fracture of multiple ribs   Closed fracture of left distal radius   Recurrent falls   Protein-calorie malnutrition, severe  Perforated sigmoid colon with pneumoperitoneum status post expiratory laparotomy, sigmoid colostomy with and pneumoperitoneum 2/25 -Postop management per surgery team.  Wet-to-dry dressing -7 more days of oral Augmentin. -Repeat CT abdomen pelvis if abdominal symptoms worsen. -Still has leukocytosis but afebrile. -Advance diet to soft  Atrial fibrillation with RVR, paroxysmal -Rate controlled, doing well on Eliquis -Amiodarone 200 mg p.o. daily  Distal left radial fracture Lateral left rib fractures -Status post ORIF.  Nonweightbearing of the left upper extremity and follow-up outpatient orthopedic.  PT/OT recommending CIR.  Incentive spirometer.  Dizziness and fall, resolved -Suspect from dehydration.  Echocardiogram EF 50%, grade 1 DD. -Currently asymptomatic.  Polymyalgia rheumatica -Daily prednisone 10 mg p.o. daily  Essential hypertension -Norvasc 10 mg p.o. daily on hold, blood pressure stable.  Hypothyroidism -P.o. Synthroid on hold, will order IV.  Normocytic anemia -Hemoglobin stable  Acute urinary retention-advised nursing staff for post voiding trial today, if fails will help to place Foley catheter in again.  Urecholine started   DVT prophylaxis: Eliquis Code Status: Full code Family Communication: None Disposition Plan:   Patient From= home  Patient Anticipated D/C place= CIR today  Barriers= CIR today  Consultants:   General  surgery  Cardiology   Subjective: Feels great no complaints.  Wishes to eat some solid food today.  Review of Systems Otherwise negative except as per HPI, including: General = no fevers, chills, dizziness,  fatigue HEENT/EYES = negative for loss of vision, double vision, blurred vision,  sore throa Cardiovascular= negative for chest pain, palpitation Respiratory/lungs= negative for shortness of breath, cough, wheezing; hemoptysis,  Gastrointestinal= negative for nausea, vomiting, abdominal pain Genitourinary= negative for Dysuria MSK = Negative for arthralgia, myalgias Neurology= Negative for headache, numbness, tingling  Psychiatry= Negative for suicidal and homocidal ideation Skin= Negative for Rash   Examination:  Constitutional: Not in acute distress Respiratory: Clear to auscultation bilaterally Cardiovascular: Normal sinus rhythm, no rubs Abdomen: Nontender nondistended good bowel sounds, ostomy bag on the abdomen noted Musculoskeletal: No edema noted Skin: No rashes seen Neurologic: CN 2-12 grossly intact.  And nonfocal Psychiatric: Normal judgment and insight. Alert and oriented x 3. Normal mood.    Right upper quadrant ostomy bag Foley catheter noted.  Objective: Vitals:   01/13/20 1132 01/13/20 1603 01/13/20 2131 01/14/20 0734  BP: 128/80 101/60 126/72 (!) 142/68  Pulse:  79 81 89  Resp:  16 18 17   Temp:  98 F (36.7 C) 98.1 F (36.7 C) 97.9 F (36.6 C)  TempSrc:   Oral Oral  SpO2:  97% 98% 95%  Weight:      Height:        Intake/Output Summary (Last 24 hours) at 01/14/2020 1001 Last data filed at 01/14/2020 0847 Gross per 24 hour  Intake 3 ml  Output 825 ml  Net -822 ml   Filed Weights   12/31/19 2115 01/04/20 2047 01/05/20 2057  Weight: 71.7 kg 70.8 kg 74.8 kg     Data Reviewed:  CBC: Recent Labs  Lab 01/08/20 0553 01/08/20 0553 01/09/20 0531 01/09/20 0531 01/10/20 0221 01/11/20 1139 01/12/20 0527 01/13/20 0106 01/14/20 0100   WBC 16.1*   < > 13.6*   < > 13.2* 20.3* 17.0* 12.8* 12.3*  NEUTROABS 13.5*  --  9.2*  --  8.1*  --  11.1*  --   --   HGB 9.2*   < > 9.8*   < > 9.4* 10.9* 10.3* 10.6* 10.9*  HCT 29.3*   < > 31.8*   < > 29.7* 34.5* 32.9* 33.0* 34.1*  MCV 95.8   < > 97.5   < > 95.2 95.0 95.9 94.0 93.9  PLT 336   < > 406*   < > 421* 556* 556* 567* 609*   < > = values in this interval not displayed.   Basic Metabolic Panel: Recent Labs  Lab 01/08/20 0553 01/10/20 0221 01/12/20 0527 01/13/20 0106 01/14/20 0100  NA 142 140 140 142 141  K 3.7 3.7 3.5 3.7 3.8  CL 110 103 104 106 106  CO2 23 25 25 25 23   GLUCOSE 109* 87 84 92 91  BUN 22 14 11 10 11   CREATININE 0.95 0.72 0.88 0.83 1.05*  CALCIUM 8.6* 8.5* 8.7* 8.6* 8.8*  MG  --   --   --  1.9 2.0   GFR: Estimated Creatinine Clearance: 33.9 mL/min (A) (by C-G formula based on SCr of 1.05 mg/dL (H)). Liver Function Tests: No results for input(s): AST, ALT, ALKPHOS, BILITOT, PROT, ALBUMIN in the last 168 hours. No results for input(s): LIPASE, AMYLASE in the last 168 hours. No results for input(s): AMMONIA in the last 168 hours. Coagulation Profile: No results for input(s): INR, PROTIME in the last 168 hours. Cardiac Enzymes: No results for input(s): CKTOTAL, CKMB, CKMBINDEX, TROPONINI in the last 168 hours. BNP (last 3 results) No results for input(s): PROBNP in the last 8760 hours. HbA1C: No results for input(s): HGBA1C in the last 72 hours. CBG: Recent Labs  Lab 01/08/20 0624 01/08/20 2014  GLUCAP 96 121*   Lipid Profile: No results for input(s): CHOL, HDL, LDLCALC, TRIG, CHOLHDL, LDLDIRECT in the last 72 hours. Thyroid Function Tests: No results for input(s): TSH, T4TOTAL, FREET4, T3FREE, THYROIDAB in the last 72 hours. Anemia Panel: No results for input(s): VITAMINB12, FOLATE, FERRITIN, TIBC, IRON, RETICCTPCT in the last 72 hours. Sepsis Labs: No results for input(s): PROCALCITON, LATICACIDVEN in the last 168 hours.  Recent Results  (from the past 240 hour(s))  Culture, blood (routine x 2)     Status: None   Collection Time: 01/08/20  8:11 AM   Specimen: BLOOD LEFT HAND  Result Value Ref Range Status   Specimen Description BLOOD LEFT HAND  Final   Special Requests   Final    BOTTLES DRAWN AEROBIC ONLY Blood Culture results may not be optimal due to an inadequate volume of blood received in culture bottles   Culture   Final    NO GROWTH 5 DAYS Performed at [facility], [place] [address] [place], [place] [address]    Report Status 01/13/2020 FINAL  Final  Culture, blood (routine x 2)     Status: None   Collection Time: 01/08/20  8:12 AM   Specimen: BLOOD LEFT FOREARM  Result Value Ref Range Status   Specimen Description BLOOD LEFT FOREARM  Final   Special Requests   Final    BOTTLES DRAWN AEROBIC ONLY Blood Culture results may not be optimal due to  an inadequate volume of blood received in culture bottles   Culture   Final    NO GROWTH 5 DAYS Performed at [facility], [place] [address], [place], [place] [address]    Report Status 01/13/2020 FINAL  Final         Radiology Studies: No results found.      Scheduled Meds: . acetaminophen  500 mg Oral Q6H  . amiodarone  200 mg Oral Daily  . amoxicillin-clavulanate  1 tablet Oral Q12H  . apixaban  5 mg Oral BID  . bethanechol  5 mg Oral TID  . bupivacaine liposome  20 mL Infiltration Once  . Chlorhexidine Gluconate Cloth  6 each Topical Daily  . diltiazem  120 mg Oral Daily  . levothyroxine  50 mcg Oral QAC breakfast  . metoprolol tartrate  25 mg Oral BID  . pantoprazole  40 mg Oral Q1200  . polyethylene glycol  17 g Oral Daily  . predniSONE  10 mg Oral Q breakfast  . saccharomyces boulardii  250 mg Oral BID  . sodium chloride flush  3 mL Intravenous Q12H  . vitamin A  10,000 Units Oral Daily   Continuous Infusions:    LOS: 16 days   Time spent= 25 mins    Ling Flesch Arsenio Loader, MD [facility]  If 7PM-7AM, please contact  night-coverage  01/14/2020, 10:01 AM

## 2020-01-14 NOTE — Progress Notes (Signed)
PT Cancellation Note  Patient Details Name: [name] MRN: [id number] DOB: 1923/05/25   Cancelled Treatment:    Reason Eval/Treat Not Completed: Other (comment). Pt discharging to CIR today.    Lorriane Shire 01/14/2020, 12:56 PM   Lorrin Goodell, PT  Office # [phone] Pager [phone]

## 2020-01-14 NOTE — Consult Note (Signed)
Late entry Ref: [name], MD   Subjective:  Abdominal pain is less. Now on liquid diet with some intake.  Objective:  Vital Signs in the last 24 hours: Temp:  [97.9 F (36.6 C)-98.1 F (36.7 C)] 97.9 F (36.6 C) (03/05 0734) Pulse Rate:  [79-89] 89 (03/05 0734) Cardiac Rhythm: Atrial fibrillation (03/04 1900) Resp:  [16-18] 17 (03/05 0734) BP: (101-142)/(60-80) 142/68 (03/05 0734) SpO2:  [95 %-98 %] 95 % (03/05 0734)  Physical Exam: BP Readings from Last 1 Encounters:  01/14/20 (!) 142/68     Wt Readings from Last 1 Encounters:  01/05/20 74.8 kg    Weight change:  Body mass index is 26.62 kg/m. HEENT: [place]/AT, Eyes-Blue, PERL, EOMI, Conjunctiva-Pale pink, Sclera-Non-icteric Neck: No JVD, No bruit, Trachea midline. Lungs:  Clear, Bilateral. Cardiac:  Regular rhythm, normal S1 and S2, no S3. II/VI systolic murmur. Abdomen:  Soft, non-tender. BS present. Extremities:  No edema present. No cyanosis. No clubbing. CNS: AxOx3, Cranial nerves grossly intact, moves all 4 extremities.  Skin: Warm and dry.   Intake/Output from previous day: 03/04 0701 - 03/05 0700 In: 3 [I.V.:3] Out: 1075 [Urine:800; Stool:275]    Lab Results: BMET    Component Value Date/Time   NA 141 01/14/2020 0100   NA 142 01/13/2020 0106   NA 140 01/12/2020 0527   K 3.8 01/14/2020 0100   K 3.7 01/13/2020 0106   K 3.5 01/12/2020 0527   CL 106 01/14/2020 0100   CL 106 01/13/2020 0106   CL 104 01/12/2020 0527   CO2 23 01/14/2020 0100   CO2 25 01/13/2020 0106   CO2 25 01/12/2020 0527   GLUCOSE 91 01/14/2020 0100   GLUCOSE 92 01/13/2020 0106   GLUCOSE 84 01/12/2020 0527   BUN 11 01/14/2020 0100   BUN 10 01/13/2020 0106   BUN 11 01/12/2020 0527   CREATININE 1.05 (H) 01/14/2020 0100   CREATININE 0.83 01/13/2020 0106   CREATININE 0.88 01/12/2020 0527   CREATININE 0.95 (H) 01/26/2016 1552   CALCIUM 8.8 (L) 01/14/2020 0100   CALCIUM 8.6 (L) 01/13/2020 0106   CALCIUM 8.7 (L) 01/12/2020  0527   GFRNONAA 45 (L) 01/14/2020 0100   GFRNONAA >60 01/13/2020 0106   GFRNONAA 56 (L) 01/12/2020 0527   GFRAA 53 (L) 01/14/2020 0100   GFRAA >60 01/13/2020 0106   GFRAA >60 01/12/2020 0527   CBC    Component Value Date/Time   WBC 12.3 (H) 01/14/2020 0100   RBC 3.63 (L) 01/14/2020 0100   HGB 10.9 (L) 01/14/2020 0100   HCT 34.1 (L) 01/14/2020 0100   PLT 609 (H) 01/14/2020 0100   MCV 93.9 01/14/2020 0100   MCH 30.0 01/14/2020 0100   MCHC 32.0 01/14/2020 0100   RDW 15.5 01/14/2020 0100   LYMPHSABS 3.0 01/12/2020 0527   MONOABS 1.1 (H) 01/12/2020 0527   EOSABS 0.3 01/12/2020 0527   BASOSABS 0.1 01/12/2020 0527   HEPATIC Function Panel Recent Labs    06/22/19 1112 01/06/20 0454  PROT 6.6 4.9*   HEMOGLOBIN A1C No components found for: HGA1C,  MPG CARDIAC ENZYMES Lab Results  Component Value Date   TROPONINI <0.03 03/04/2016   BNP No results for input(s): PROBNP in the last 8760 hours. TSH Recent Labs    06/22/19 1112 01/07/20 1036  TSH 1.42 0.187*   CHOLESTEROL Recent Labs    06/22/19 1112  CHOL 190    Scheduled Meds: . acetaminophen  500 mg Oral Q6H  . amiodarone  200 mg Oral  Daily  . amoxicillin-clavulanate  1 tablet Oral Q12H  . apixaban  5 mg Oral BID  . bethanechol  5 mg Oral TID  . bupivacaine liposome  20 mL Infiltration Once  . Chlorhexidine Gluconate Cloth  6 each Topical Daily  . diltiazem  120 mg Oral Daily  . levothyroxine  50 mcg Oral QAC breakfast  . metoprolol tartrate  25 mg Oral BID  . pantoprazole  40 mg Oral Q1200  . polyethylene glycol  17 g Oral Daily  . predniSONE  10 mg Oral Q breakfast  . saccharomyces boulardii  250 mg Oral BID  . sodium chloride flush  3 mL Intravenous Q12H  . vitamin A  10,000 Units Oral Daily   Continuous Infusions: PRN Meds:.morphine injection, ondansetron (ZOFRAN) IV, oxyCODONE, polyethylene glycol, promethazine, senna-docusate  Assessment/Plan: Chronic atrial fibrillation Acute perforated  diverticulitis with peritonitis Abdominal pain Intraabdominal hematoma S/P Hartmann's procedure Anemia of blood loss, stable\HTN Hypothyroidism Polymyalgia rheumatica  Change to po metoprolol.   LOS: 16 days   Time spent including chart review, lab review, examination, discussion with patient, nurse and referring doctor : 30 min   Dixie Dials  MD  01/14/2020, 10:57 AM

## 2020-01-14 NOTE — IPOC Note (Addendum)
Individualized overall Plan of Care [facility]) Patient Details Name: LYNSEY SCHIESSER MRN: [id number] DOB: 09/30/1923  Admitting Diagnosis: Trauma  Hospital Problems: Principal Problem:   Trauma Active Problems:   HTN (hypertension)   Acute renal failure superimposed on stage 3a chronic kidney disease ([place])   Debility   Physical debility   Left radial fracture   Leucocytosis   Anemia   Peritonitis ([place])     Functional Problem List: Nursing Bowel, Bladder, Endurance, Medication Management, Pain, Motor, Safety, Skin Integrity  PT Balance, Endurance, Pain, Safety, Sensory, Skin Integrity  OT Balance, Behavior, Safety, Sensory, Edema, Skin Integrity, Endurance, Motor, Pain  SLP    TR         Basic ADL's: OT Grooming, Bathing, Dressing, Toileting     Advanced  ADL's: OT Simple Meal Preparation, Laundry     Transfers: PT Bed Mobility, Bed to Chair, Car, [employer], Futures trader, Metallurgist: PT Ambulation, Emergency planning/management officer, Stairs     Additional Impairments: OT Fuctional Use of Upper Extremity  SLP        TR      Anticipated Outcomes Item Anticipated Outcome  Self Feeding No goal  Swallowing      Basic self-care  Supervision-Min A  Toileting  Min A   Bathroom Transfers Supervision  Bowel/Bladder  mod assist  Transfers  Supervision assist with LRAD  Locomotion  Ambulatory at house hold level with supervision assist and LRAD  Communication     Cognition     Pain  < 3  Safety/Judgment  Supervision   Therapy Plan: PT Intensity: Minimum of 1-2 x/day ,45 to 90 minutes PT Frequency: 5 out of 7 days PT Duration Estimated Length of Stay: 10-12 days OT Intensity: Minimum of 1-2 x/day, 45 to 90 minutes OT Frequency: 5 out of 7 days OT Duration/Estimated Length of Stay: 10-12 days      Team Interventions: Nursing Interventions Patient/Family Education, Pain Management, Bladder Management, Medication Management, Discharge Planning, Skin  Care/Wound Management, Bowel Management, Disease Management/Prevention, Psychosocial Support  PT interventions Ambulation/gait training, Discharge planning, Functional mobility training, Psychosocial support, Therapeutic Activities, Therapeutic Exercise, Wheelchair propulsion/positioning, Skin care/wound management, Disease management/prevention, Medical illustrator training, Cognitive remediation/compensation, Pain management, Splinting/orthotics, UE/LE Strength taining/ROM, DME/adaptive equipment instruction, Community reintegration, UE/LE Coordination activities, Patient/family education, IT trainer  OT Interventions Training and development officer, Discharge planning, Pain management, Self Care/advanced ADL retraining, Therapeutic Activities, UE/LE Coordination activities, Therapeutic Exercise, Skin care/wound managment, Patient/family education, Functional mobility training, Disease mangement/prevention, Academic librarian, Engineer, drilling, Psychosocial support, Splinting/orthotics, UE/LE Strength taining/ROM, Wheelchair propulsion/positioning  SLP Interventions    TR Interventions    SW/CM Interventions Discharge Planning, Patient/Family Education, Psychosocial Support, Disease Management/Prevention   Barriers to Discharge MD  Medical stability and Weight bearing restrictions  Nursing Incontinence, Other (comments) new ostomy  PT Inaccessible home environment, Decreased caregiver support, Medical stability, Home environment access/layout, Wound Care, Weight bearing restrictions    OT Medical stability, Wound Care, Weight bearing restrictions, Behavior    SLP      SW Wound Care, Other (comments), Decreased caregiver support, Weight bearing restrictions Colostomy management; patient unable to complete due to right UE fx   Team Discharge Planning: Destination: PT-Home ,OT- Home , SLP-  Projected Follow-up: PT-Home health PT, OT-  24 hour supervision/assistance, Home  health OT, SLP-  Projected Equipment Needs: PT-To be determined, Wheelchair (measurements), Wheelchair cushion (measurements), Other (comment), OT- To be determined, SLP-  Equipment Details: PT- , OT-  Patient/family involved in  discharge planning: PT- Patient,  OT-Patient, SLP-   MD ELOS: 7-10 days. Medical Rehab Prognosis:  Excellent Assessment: 86 year old female with history of HTN, melanoma, peripheral neuropathy, HTN, CKD, left hip pain, PMR- new diagnosis, who was admitted on 12/29/2019 with nausea, vomiting, diarrhea, and dizziness resulting in a fall with left wrist pain.  Work-up revealed AKI on CKD with left rib and distal radius fracture proximal to prior ORIF.  Echocardiogram showed ejection fraction of 50%  with apical septal and apical anterior hypokinesis.  Dr. Angelena Form was consulted for input and felt that dizziness was due to dehydration and no further cardiac work-up needed.  She underwent removal of left distal radius hardware with internal fixation of left radial shaft fracture by Dr. Caralyn Guile on 02/20 and postop to be NWB on LUE. She developed abdominal pain due to perorated sigmoid diverticulitis with pneumoperitoneum and required exploratory lap with sigmoid colectomy and end colostomy by Dr. Redmond Pulling on 01/06/2020.  Hospital course further complicated by urinary retention and a Foley was placed for decompression.  She developed atrial fibrillation with RVR on 02/26 and treated with BB and IV heparin  Per Dr. Doylene Canard.  Treated with IV antibiotics for gross purulent peritonitis and leukocytosis being monitored.  Follow-up CT done on 03/02 due to rise in WBC to 20.3 and showed  2 fluid collection in LLQ right posterior pelvic.   IR consulted for input and felt that small collections were due to blood rather than abscess and recommended repeat CT abdomen in 72 hours.  She was cleared to transition to Eliquis and Zosyn changed to Augmentin X 7 days as leukocytosis was resolving. CCS felt that  repeat CT abdomen not needed. She has been tolerating full liquids with decrease in abdominal pain-->advancing as tolerated. She continues to have bouts of A flutter and Cardizem added on 03/04.  Patient with resulting functional deficits with mobility, transfers, self-care.  We will set goals for Supervision/Min A with PT, Min/Mod A with OT.  Due to the current state of emergency, patients may not be receiving their 3-hours of Medicare-mandated therapy.  See Team Conference Notes for weekly updates to the plan of care

## 2020-01-14 NOTE — Progress Notes (Signed)
PMR Admission Coordinator Pre-Admission Assessment   Patient: [name] is an 86 y.o., female MRN: [id number] DOB: 1922/11/12 Height: 5' 6" (167.6 cm) Weight: 74.8 kg   Insurance Information HMO:     PPO: yes     PCP:      IPA:      80/20:      OTHER:  PRIMARY: [employer] Medicare      Policy#: [id number]      Subscriber: pt CM Name: Lovey Newcomer      Phone#: [phone]     Fax#: [phone] Pre-Cert#: [id number] auth for CIR provided by Lovey Newcomer with [facility] Medicare.  Updates due to Lestine Box at fax listed above on 3/11.       Employer: n/a Benefits:  Phone #: [phone]     Name:  Eff. Date: 11/12/19     Deduct: $0      Out of Pocket Max: $4000 (met $335.09)      Life Max: n/a CIR: $160/day for days 1-10      SNF: 20 full days Outpatient:      Co-Pay: $20 Home Health: 100%      Co-Pay:  DME: 80%     Co-Pay: 20% Providers: preferred network SECONDARY:       Policy#:       Subscriber:  CM Name:       Phone#:      Fax#:  Pre-Cert#:       Employer:  Benefits:  Phone #:      Name:  Eff. Date:      Deduct:       Out of Pocket Max:       Life Max:  CIR:       SNF:  Outpatient:      Co-Pay:  Home Health:       Co-Pay:  DME:      Co-Pay:    Medicaid Application Date:       Case Manager:  Disability Application Date:       Case Worker:    The "Data Collection Information Summary" for patients in Inpatient Rehabilitation Facilities with attached "Privacy Act [place] Records" was provided and verbally reviewed with: Patient   Emergency Contact Information         Contact Information     Name Relation Home Work Mobile    [place] Daughter [phone]    [place], Stander [phone]   [phone]    Emmanuela, Ghazi [phone]             Current Medical History  Patient Admitting Diagnosis: debility following L radial fx (s/p ORIF) and perforated diverticulitis (s/p sigmoid colectomy with end ostomy)    History of Present Illness:   Pt is a 86 y/o female, PMH significant for HTN, hypothyroidism, polymyalgia rheumatica, CKD III, and PVD admitted on 12/29/2019 following a fall at home.  Xrays showed L distal radius fracture which was repaired by Dr. Caralyn Guile on 2/20.  Postop therapy recommendations were for CIR due to functional decline and age, but insurance authorization was denied.  Overnight on 2/23 pt developed severe abdominal pain with decrease in appetite.  CT confirmed pneumoperiotneum likely 2/2 perforated diverticuli.  Pt does endorse 2 week history of diarrhea but denies blood in stool.  General surgery was consulted and pt underwent lap chole with sigmoid colectomy and end ostomy per Dr. Redmond Pulling on 2/25.  CIR was recommended again to maximize pt independence prior to returning home.    Patient's  medical record from [facility] has been reviewed by the rehabilitation admission coordinator and physician.   Past Medical History      Past Medical History:  Diagnosis Date  . DVT (deep venous thrombosis) ([place]) 09/2015    RLE  . GERD (gastroesophageal reflux disease)    . Hypertension    . Hypothyroidism    . 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 Peripheral vascular disease ([place])      peripheral neuropathy      Family History   family history includes COPD in her father; Cancer in her daughter and mother; Emphysema in her father.   Prior Rehab/Hospitalizations Has the patient had prior rehab or hospitalizations prior to admission? No   Has the patient had major surgery during 100 days prior to admission? Yes              Current Medications   Current Facility-Administered Medications:  .  acetaminophen (TYLENOL) tablet 500 mg, 500 mg, Oral, Q6H, Dixie Dials, MD, 500 mg at 01/13/20 1825 .  amiodarone (PACERONE) tablet 200 mg, 200 mg, Oral, Daily, Charolette Forward, MD, 200 mg at 01/14/20 0843 .  amoxicillin-clavulanate (AUGMENTIN) 875-125 MG per tablet 1 tablet, 1 tablet,  Oral, Q12H, Amin, Ankit Chirag, MD, 1 tablet at 01/14/20 0842 .  apixaban (ELIQUIS) tablet 5 mg, 5 mg, Oral, BID, Bertis Ruddy, RPH, 5 mg at 01/14/20 0843 .  bethanechol (URECHOLINE) tablet 5 mg, 5 mg, Oral, TID, Meuth, Brooke A, PA-C .  bupivacaine liposome (EXPAREL) 1.3 % injection 266 mg, 20 mL, Infiltration, Once, Maczis, Barth Kirks, PA-C .  Chlorhexidine Gluconate Cloth 2 % PADS 6 each, 6 each, Topical, Daily, Shelly Coss, MD, 6 each at 01/11/20 1843 .  diltiazem (CARDIZEM CD) 24 hr capsule 120 mg, 120 mg, Oral, Daily, Dixie Dials, MD, 120 mg at 01/14/20 0844 .  levothyroxine (SYNTHROID) tablet 50 mcg, 50 mcg, Oral, QAC breakfast, Dixie Dials, MD, 50 mcg at 01/14/20 0546 .  metoprolol tartrate (LOPRESSOR) tablet 25 mg, 25 mg, Oral, BID, Dixie Dials, MD, 25 mg at 01/14/20 0843 .  morphine 2 MG/ML injection 2 mg, 2 mg, Intravenous, Q6H PRN, Shelly Coss, MD, 2 mg at 01/11/20 1656 .  ondansetron (ZOFRAN) injection 4 mg, 4 mg, Intravenous, Q6H PRN, Jillyn Ledger, PA-C, 4 mg at 01/13/20 0021 .  oxyCODONE (Oxy IR/ROXICODONE) immediate release tablet 5-7.5 mg, 5-7.5 mg, Oral, Q4H PRN, Jesusita Oka, MD, 5 mg at 01/11/20 1522 .  pantoprazole (PROTONIX) EC tablet 40 mg, 40 mg, Oral, Q1200, Dixie Dials, MD, 40 mg at 01/13/20 1137 .  polyethylene glycol (MIRALAX / GLYCOLAX) packet 17 g, 17 g, Oral, Daily, Jillyn Ledger, PA-C, 17 g at 01/10/20 6761 .  polyethylene glycol (MIRALAX / GLYCOLAX) packet 17 g, 17 g, Oral, Daily PRN, Amin, Ankit Chirag, MD .  predniSONE (DELTASONE) tablet 10 mg, 10 mg, Oral, Q breakfast, Maczis, Barth Kirks, PA-C, 10 mg at 01/14/20 0844 .  promethazine (PHENERGAN) injection 12.5 mg, 12.5 mg, Intravenous, Q6H PRN, Shelly Coss, MD, 12.5 mg at 01/11/20 0906 .  saccharomyces boulardii (FLORASTOR) capsule 250 mg, 250 mg, Oral, BID, Jillyn Ledger, PA-C, 250 mg at 01/14/20 9509 .  senna-docusate (Senokot-S) tablet 2 tablet, 2 tablet, Oral, QHS PRN,  Amin, Ankit Chirag, MD .  sodium chloride flush (NS) 0.9 % injection 3 mL, 3 mL, Intravenous, Q12H, Maczis, Barth Kirks, PA-C, 3 mL at 01/14/20 0849 .  vitamin A  capsule 10,000 Units, 10,000 Units, Oral, Daily, Jesusita Oka, MD, 10,000 Units at 01/14/20 0844   Patients Current Diet:     Diet Order                      DIET SOFT Room service appropriate? Yes; Fluid consistency: Thin  Diet effective now                   Precautions / Restrictions Precautions Precautions: Fall Precaution Comments: L side ostomy bag, check before mobilizing Restrictions Weight Bearing Restrictions: Yes LUE Weight Bearing: Weight bear through elbow only Other Position/Activity Restrictions: may use platform walker    Has the patient had 2 or more falls or a fall with injury in the past year? Yes   Prior Activity Level Community (5-7x/wk): independent PLOF, living alone with close family support, driving, went out daily   Prior Functional Level Self Care: Did the patient need help bathing, dressing, using the toilet or eating? Independent   Indoor Mobility: Did the patient need assistance with walking from room to room (with or without device)? Independent   Stairs: Did the patient need assistance with internal or external stairs (with or without device)? Independent   Functional Cognition: Did the patient need help planning regular tasks such as shopping or remembering to take medications? [place] / [place] Devices/Equipment: Environmental consultant (specify type), Cane (specify quad or straight) Home Equipment: Walker - 2 wheels, Grab bars - tub/shower   Prior Device Use: Indicate devices/aids used by the patient prior to current illness, exacerbation or injury? None of the above   Current Functional Level Cognition   Overall Cognitive Status: Within Functional Limits for tasks assessed Orientation Level: Oriented X4 General Comments: increased time and  effort to mobilize, secondary to nausea    Extremity Assessment (includes Sensation/Coordination)   Upper Extremity Assessment: Defer to OT evaluation LUE Deficits / Details: Left radial fx. Long splint in place. Pt able to move digits. Difficulty performing AROM due to pain adn weight of RUE.  LUE Coordination: decreased fine motor, decreased gross motor  Lower Extremity Assessment: Generalized weakness, LLE deficits/detail LLE Deficits / Details: weakness in ankle noted     ADLs   Overall ADL's : Needs assistance/impaired Eating/Feeding: Set up, Sitting Eating/Feeding Details (indicate cue type and reason): Using one hand. Will need assistance for bilateral tasks Grooming: Oral care, Sitting, Set up, Brushing hair Grooming Details (indicate cue type and reason): seated in recliner Upper Body Bathing: Moderate assistance, Sitting Lower Body Bathing: Moderate assistance Lower Body Bathing Details (indicate cue type and reason): Pt performing peri care at sink with Min A for balance; requiring increased assistance for distal areas Upper Body Dressing : Moderate assistance, Sitting Upper Body Dressing Details (indicate cue type and reason): dressing L UE first Lower Body Dressing: Moderate assistance, Sit to/from stand Lower Body Dressing Details (indicate cue type and reason): crossed foot over opposite knee to pull up socks Toilet Transfer: Minimal assistance, Ambulation, BSC, RW Toilet Transfer Details (indicate cue type and reason): Simulated with recliner, more so for equipment and "woozy feeling" Toileting- Clothing Manipulation and Hygiene: Sitting/lateral lean, Min guard Functional mobility during ADLs: Minimal assistance, Rolling walker General ADL Comments: Pt set with ADLs in chair, however unable to progress due to continued nausea (pt is min-mod to complete functional ADLs in standing)     Mobility   Overal bed mobility: Needs Assistance Bed Mobility: Supine to  Sit Rolling:  Min assist Sidelying to sit: Min assist Supine to sit: Supervision Sit to sidelying: Min assist General bed mobility comments: no physical assist, HOB up     Transfers   Overall transfer level: Needs assistance Equipment used: Left platform walker Transfers: Sit to/from Stand Sit to Stand: Mod assist Stand pivot transfers: Min assist General transfer comment: assist to rise and steady     Ambulation / Gait / Stairs / Wheelchair Mobility   Ambulation/Gait Ambulation/Gait assistance: Herbalist (Feet): 10 Feet Assistive device: Left platform walker Gait Pattern/deviations: Step-through pattern, Drifts right/left, Decreased step length - left, Decreased dorsiflexion - left, Narrow base of support, Trunk flexed, Decreased stride length General Gait Details: Min assist for steadying, guiding pt and RW. Verbal cuing for correct placement of self in RW, placement of LUE. Gait velocity: decr Gait velocity interpretation: <1.31 ft/sec, indicative of household ambulator     Posture / Balance Dynamic Sitting Balance Sitting balance - Comments: Pt sitting sideways due to abdominal pain initially but was able to get her comfortable prior to standing.  Balance Overall balance assessment: Needs assistance Sitting-balance support: Feet supported, Bilateral upper extremity supported Sitting balance-Leahy Scale: Good Sitting balance - Comments: Pt sitting sideways due to abdominal pain initially but was able to get her comfortable prior to standing.  Standing balance support: Bilateral upper extremity supported Standing balance-Leahy Scale: Poor Standing balance comment: reliant on UE support of RW and min assist     Special needs/care consideration BiPAP/CPAP no CPM no Continuous Drip IV no Dialysis no        Days n/a Life Vest no Oxygen no Special Bed no Trach Size no Wound Vac (area) no      Location n/a Skin incision to L wrist and abdomen, new stoma to RUQ                            Bowel mgmt: colostomy Bladder mgmt: incontinent Diabetic mgmt: no Behavioral consideration no Chemo/radiation no    Previous Home Environment (from acute therapy documentation) Living Arrangements: Alone Available Help at Discharge: Family, Available 24 hours/day Type of Home: House Home Layout: One level Home Access: Stairs to enter Entrance Stairs-Rails: None Technical brewer of Steps: 1 Bathroom Shower/Tub: Multimedia programmer: Handicapped height Home Care Services: Yes Type of Home Care Services: Housekeeping Additional Comments: Son and daughter who lives nextdoor   Discharge Living Setting Plans for Discharge Living Setting: Patient's home Type of Home at Discharge: House Discharge Home Layout: One level Discharge Home Access: Stairs to enter Entrance Stairs-Rails: Can reach both Entrance Stairs-Number of Steps: 1 Discharge Bathroom Shower/Tub: Tub/shower unit Discharge Bathroom Toilet: Handicapped height Discharge Bathroom Accessibility: Yes How Accessible: Accessible via walker Does the patient have any problems obtaining your medications?: No   Social/Family/Support Systems Anticipated Caregiver: daughter Jana Half (colostomy care), sons Gershon Mussel and [place] for mobility/other needs Anticipated Caregiver's Contact Information: Jana Half [phone] Ability/Limitations of Caregiver: n/a Caregiver Availability: 24/7 Discharge Plan Discussed with Primary Caregiver: Yes Is Caregiver In Agreement with Plan?: Yes Does Caregiver/Family have Issues with Lodging/Transportation while Pt is in Rehab?: No   Goals/Additional Needs Patient/Family Goal for Rehab: PT/OT mod I Expected length of stay: 5-7 days Dietary Needs: reg/thin Equipment Needs: tbd Pt/Family Agrees to Admission and willing to participate: Yes Program Orientation Provided & Reviewed with Pt/Caregiver Including Roles  & Responsibilities: Yes   Decrease burden of Care  through IP  rehab admission: Patient/family education for ostomy   Possible need for SNF placement upon discharge: No.  Pt/family refusing SNF level care.    Patient Condition: I have reviewed medical records from [facility], spoken with CM, and patient and son. I met with patient at the bedside for inpatient rehabilitation assessment.  Patient will benefit from ongoing PT and OT, can actively participate in 3 hours of therapy a day 5 days of the week, and can make measurable gains during the admission.  Patient will also benefit from the coordinated team approach during an Inpatient Acute Rehabilitation admission.  The patient will receive intensive therapy as well as Rehabilitation physician, nursing, social worker, and care management interventions.  Due to bladder management, bowel management, safety, skin/wound care, medication administration, pain management and patient education the patient requires 24 hour a day rehabilitation nursing.  The patient is currently min to min guard with mobility and basic ADLs.  Discharge setting and therapy post discharge at home is anticipated.  Patient has agreed to participate in the Acute Inpatient Rehabilitation Program and will admit today.   Preadmission Screen Completed By:  Michel Santee, PT, DPT 01/14/2020 9:44 AM ______________________________________________________________________   Discussed status with Dr. Posey Pronto on 01/14/20  at 9:44 AM  and received approval for admission today.   Admission Coordinator:  Michel Santee, PT, DPT time 9:44 AM Sudie Grumbling 01/14/20     Assessment/Plan: Diagnosis: debility   1. Does the need for close, 24 hr/day Medical supervision in concert with the patient's rehab needs make it unreasonable for this patient to be served in a less intensive setting? Potentially  2. Co-Morbidities requiring supervision/potential complications: HTN, hypothyroidism, polymyalgia rheumatica, CKD III, and PVD  3. Due to safety,  skin/wound care, disease management, pain management and patient education, does the patient require 24 hr/day rehab nursing? Potentially 4. Does the patient require coordinated care of a physician, rehab nurse, PT, OT to address physical and functional deficits in the context of the above medical diagnosis(es)? Potentially Addressing deficits in the following areas: balance, endurance, locomotion, strength, transferring, bathing, dressing, toileting and psychosocial support 5. Can the patient actively participate in an intensive therapy program of at least 3 hrs of therapy 5 days a week? Yes 6. The potential for patient to make measurable gains while on inpatient rehab is excellent and good 7. Anticipated functional outcomes upon discharge from inpatient rehab: supervision PT, supervision and min assist OT, n/a SLP 8. Estimated rehab length of stay to reach the above functional goals is: 4-7 days. 9. Anticipated discharge destination: Potentially home 10. Overall Rehab/Functional Prognosis: excellent     MD Signature: Delice Lesch, MD, ABPMR

## 2020-01-15 ENCOUNTER — Inpatient Hospital Stay (HOSPITAL_COMMUNITY): Payer: Medicare PPO | Admitting: Occupational Therapy

## 2020-01-15 ENCOUNTER — Inpatient Hospital Stay (HOSPITAL_COMMUNITY): Payer: Medicare PPO | Admitting: Physical Therapy

## 2020-01-15 DIAGNOSIS — R5381 Other malaise: Principal | ICD-10-CM

## 2020-01-15 LAB — COMPREHENSIVE METABOLIC PANEL
ALT: 23 U/L (ref 0–44)
AST: 17 U/L (ref 15–41)
Albumin: 2.5 g/dL — ABNORMAL LOW (ref 3.5–5.0)
Alkaline Phosphatase: 137 U/L — ABNORMAL HIGH (ref 38–126)
Anion gap: 13 (ref 5–15)
BUN: 10 mg/dL (ref 8–23)
CO2: 26 mmol/L (ref 22–32)
Calcium: 8.8 mg/dL — ABNORMAL LOW (ref 8.9–10.3)
Chloride: 104 mmol/L (ref 98–111)
Creatinine, Ser: 0.93 mg/dL (ref 0.44–1.00)
GFR calc Af Amer: >60 mL/min (ref >60)
GFR calc non Af Amer: 53 mL/min — ABNORMAL LOW (ref >60)
Glucose, Bld: 99 mg/dL (ref 70–99)
Potassium: 3.5 mmol/L (ref 3.5–5.1)
Sodium: 143 mmol/L (ref 135–145)
Total Bilirubin: 1 mg/dL (ref 0.3–1.2)
Total Protein: 5.7 g/dL — ABNORMAL LOW (ref 6.5–8.1)

## 2020-01-15 LAB — CBC WITH DIFFERENTIAL/PLATELET
Abs Immature Granulocytes: 0.23 10*3/uL — ABNORMAL HIGH (ref 0.00–0.07)
Basophils Absolute: 0 10*3/uL (ref 0.0–0.1)
Basophils Relative: 0 %
Eosinophils Absolute: 0.2 10*3/uL (ref 0.0–0.5)
Eosinophils Relative: 1 %
HCT: 34.6 % — ABNORMAL LOW (ref 36.0–46.0)
Hemoglobin: 11 g/dL — ABNORMAL LOW (ref 12.0–15.0)
Immature Granulocytes: 2 %
Lymphocytes Relative: 17 %
Lymphs Abs: 2 10*3/uL (ref 0.7–4.0)
MCH: 30.5 pg (ref 26.0–34.0)
MCHC: 31.8 g/dL (ref 30.0–36.0)
MCV: 95.8 fL (ref 80.0–100.0)
Monocytes Absolute: 1 10*3/uL (ref 0.1–1.0)
Monocytes Relative: 8 %
Neutro Abs: 8.6 10*3/uL — ABNORMAL HIGH (ref 1.7–7.7)
Neutrophils Relative %: 72 %
Platelets: 641 10*3/uL — ABNORMAL HIGH (ref 150–400)
RBC: 3.61 MIL/uL — ABNORMAL LOW (ref 3.87–5.11)
RDW: 15.6 % — ABNORMAL HIGH (ref 11.5–15.5)
WBC: 12.1 10*3/uL — ABNORMAL HIGH (ref 4.0–10.5)
nRBC: 0 % (ref 0.0–0.2)

## 2020-01-15 MED ORDER — POTASSIUM CHLORIDE ER 10 MEQ PO TBCR
10.0000 meq | EXTENDED_RELEASE_TABLET | Freq: Every day | ORAL | Status: DC
  Administered 2020-01-15 – 2020-01-17 (×3): 10 meq via ORAL
  Filled 2020-01-15 (×5): qty 1

## 2020-01-15 MED ORDER — AMIODARONE HCL 200 MG PO TABS
100.0000 mg | ORAL_TABLET | Freq: Every day | ORAL | Status: DC
  Administered 2020-01-16 – 2020-01-20 (×5): 100 mg via ORAL
  Filled 2020-01-15 (×5): qty 1

## 2020-01-15 NOTE — Evaluation (Signed)
Physical Therapy Assessment and Plan  Patient Details  Name: [name] MRN: [id number] Date of Birth: 02-09-1923  PT Diagnosis: Difficulty walking, Muscle weakness, Pain in joint and Pain in L forearm Rehab Potential: Good ELOS: 10-12 days   Today's Date: 01/15/2020 PT Individual Time: 0900-1000 PT Individual Time Calculation (min): 60 min    Problem List:  Patient Active Problem List   Diagnosis Date Noted  . Debility 01/14/2020  . Physical debility 01/14/2020  . Trauma   . Postoperative pain   . Urinary retention   . New onset atrial fibrillation ([place])   . Protein-calorie malnutrition, severe 01/13/2020  . Acute renal failure superimposed on stage 3a chronic kidney disease ([place]) 12/29/2019  . Closed fracture of multiple ribs 12/29/2019  . Closed fracture of left distal radius 12/29/2019  . Recurrent falls 12/29/2019  . Positive ANA (antinuclear antibody) 12/29/2019  . Osteoarthritis 11/13/2017  . Hypertriglyceridemia 01/29/2017  . Excessive gas 01/09/2017  . Preoperative cardiovascular examination 07/31/2016  . Elevated lipase 03/06/2016  . Osteopenia 02/08/2016  . Hypothyroidism 01/26/2016  . HTN (hypertension) 01/26/2016  . Left leg numbness 10/06/2012    Past Medical History:  Past Medical History:  Diagnosis Date  . DVT (deep venous thrombosis) ([place]) 09/2015   RLE  . GERD (gastroesophageal reflux disease)   . Hypertension   . Hypothyroidism   . Melanoma ([place]) 06/16/2017   Facial melanoma, removed by Dr. Harvel Quale  . Peripheral vascular disease ([place])    peripheral neuropathy   Past Surgical History:  Past Surgical History:  Procedure Laterality Date  . ABDOMINAL HYSTERECTOMY    . APPENDECTOMY    . BACK SURGERY    . BREAST SURGERY     left breast biopsy  . COLOSTOMY N/A 01/06/2020   Procedure: Colostomy;  Surgeon: Greer Pickerel, MD;  Location: [facility];  Service: General;  Laterality: N/A;  . EYE SURGERY     cataract  . HARDWARE REMOVAL Left 01/01/2020    Procedure: HARDWARE REMOVAL;  Surgeon: Iran Planas, MD;  Location: [facility];  Service: Orthopedics;  Laterality: Left;  . LAPAROTOMY N/A 01/06/2020   Procedure: Exploratory Laparotomy, Open Sigmoid colectomy;  Surgeon: Greer Pickerel, MD;  Location: [facility];  Service: General;  Laterality: N/A;  . LIPOMA EXCISION Left 04/25/2015   Procedure: EXCISION OF LIPOMA LEFT ARM;  Surgeon: Donnie Mesa, MD;  Location: [facility];  Service: General;  Laterality: Left;  . LUMBAR LAMINECTOMY/DECOMPRESSION MICRODISCECTOMY  11/20/2011   Procedure: LUMBAR LAMINECTOMY/DECOMPRESSION MICRODISCECTOMY;  Surgeon: Jeneen Rinks P [place];  Location: [facility];  Service: Orthopedics;  Laterality: N/A;  Decompressive Laminectomy L2 to the Sacrum (X-Ray)  . OPEN REDUCTION INTERNAL FIXATION (ORIF) DISTAL RADIAL FRACTURE Left 01/01/2020   Procedure: OPEN REDUCTION INTERNAL FIXATION (ORIF) DISTAL RADIAL FRACTURE;  Surgeon: Iran Planas, MD;  Location: [facility];  Service: Orthopedics;  Laterality: Left;  . OTHER SURGICAL HISTORY     left wrist surgery - has plate in left wrist   . OTHER SURGICAL HISTORY     right knee surgery due to torn cartilage   . TONSILLECTOMY    . WRIST FRACTURE SURGERY Left     Assessment & Plan Clinical Impression: Patient is a 86 year old female with history of HTN, melanoma, peripheral neuropathy, HTN, CKD, left hip pain, PMR- new diagnosis, who was admitted on 12/29/2019 with nausea, vomiting, diarrhea, and dizziness resulting in a fall with left wrist pain.  History taken from chart review and patient.  Work-up revealed AKI  on CKD with left rib and distal radius fracture proximal to prior ORIF.  Echocardiogram showed ejection fraction of 50%  with apical septal and apical anterior hypokinesis.  Dr. Angelena Form was consulted for input and felt that dizziness was due to dehydration and no further cardiac work-up needed.  She underwent removal of left distal radius hardware with internal fixation of left radial  shaft fracture by Dr. Caralyn Guile on 02/20 and postop to be NWB on LUE.  She developed abdominal pain due to perorated sigmoid diverticulitis with pneumoperitoneum and required exploratory lap with sigmoid colectomy and end colostomy by Dr. Redmond Pulling on 01/06/2020.  Hospital course further complicated by urinary retention and a Foley was placed for decompression.  She developed atrial fibrillation with RVR on 02/26 and treated with BB and IV heparin  Per Dr. Doylene Canard.  Treated with IV antibiotics for gross purulent peritonitis and leukocytosis being monitored.  Follow-up CT done on 03/02 due to rise in WBC to 20.3 and showed  2 fluid collection in LLQ right posterior pelvic.   IR consulted for input and felt that small collections were due to blood rather than abscess and recommended repeat CT abdomen in 72 hours.  She was cleared to transition to Eliquis and Zosyn changed to Augmentin X 7 days as leukocytosis was resolving. CCS felt that repeat CT abdomen not needed. She has been tolerating full liquids with decrease in abdominal pain-->diet advanced to soft's today. She continues to have bouts of A flutter and Cardizem added on 03/04. Therapy ongoing and CIR recommended due to functional decline.  Patient transferred to CIR on 01/14/2020 .   Patient currently requires mod with mobility secondary to muscle weakness and muscle joint tightness, decreased cardiorespiratoy endurance and decreased sitting balance, decreased standing balance, decreased balance strategies and difficulty maintaining precautions.  Prior to hospitalization, patient was modified independent  with mobility and lived with Alone in a House home.  Home access is 1Stairs to enter.  Patient will benefit from skilled PT intervention to maximize safe functional mobility, minimize fall risk and decrease caregiver burden for planned discharge home with 24 hour supervision.  Anticipate patient will benefit from follow up [place] at discharge.  PT - End of  Session Activity Tolerance: Tolerates 10 - 20 min activity with multiple rests Endurance Deficit: Yes PT Assessment Rehab Potential (ACUTE/IP ONLY): Good PT Barriers to Discharge: [place] home environment;Decreased caregiver support;Medical stability;Home environment access/layout;Wound Care;Weight bearing restrictions PT Patient demonstrates impairments in the following area(s): Balance;Endurance;Pain;Safety;Sensory;Skin Integrity PT Transfers Functional Problem(s): Bed Mobility;Bed to Chair;Car;Furniture;Floor PT Locomotion Functional Problem(s): Ambulation;Wheelchair Mobility;Stairs PT Plan PT Intensity: Minimum of 1-2 x/day ,45 to 90 minutes PT Frequency: 5 out of 7 days PT Duration Estimated Length of Stay: 7-10 days PT Treatment/Interventions: Ambulation/gait training;Discharge planning;Functional mobility training;Psychosocial support;Therapeutic Activities;Therapeutic Exercise;Wheelchair propulsion/positioning;Skin care/wound management;Disease management/prevention;Balance/vestibular training;Cognitive remediation/compensation;Pain management;Splinting/orthotics;UE/LE Strength taining/ROM;DME/adaptive equipment instruction;Community reintegration;UE/LE Coordination activities;Patient/family education;Stair training PT Transfers Anticipated Outcome(s): Supervision assist with LRAD PT Locomotion Anticipated Outcome(s): Ambulatory at house hold level with supervision assist and LRAD PT Recommendation Recommendations for Other Services: Therapeutic Recreation consult Therapeutic Recreation Interventions: Outing/community reintergration;Stress management Follow Up Recommendations: Home health PT Patient destination: Home Equipment Recommended: To be determined;Wheelchair (measurements);Wheelchair cushion (measurements);Other (comment)  Skilled Therapeutic Intervention Pt received supine in bed and agreeable to PT. Supine>sit transfer with min assist and cues for sequencing, mild  anxiety noted with movement.  PT instructed patient in PT Evaluation and initiated treatment intervention; see below for results. PT educated patient in [place], rehab potential,  rehab goals, and discharge recommendations.  WC mobility x 141f with min assist to maintain straight path with use of BLE and intermittent use of RUE, but difficulty coordinating UE and LE   Gait training with PFRW x 127fand mod assist due to antalgic movement on the L. Additional gait training with HW x 1049fith min assist for safety. Improved weight shift, posture, step length with HW compared to [place] Transfer training with P[place] Valleyd min-mod assist depending on surface height. Additional sit<>sand and stand pivot with HW and min assist from elevated mat height.   Patient returned to room and [facility] and all needs met.  Throughout treatment, pt noted to have increased anxiety and FOF with all standing mobility, requiring increased assist intermittently.     PT Evaluation Precautions/Restrictions   fall. NWB LUE?. CMarland Kitchenlostomy  General   Vital SignsTherapy Vitals Pulse Rate: (!) 58 BP: 122/60 Pain Pain Assessment Pain Scale: 0-10 Pain Score: 0-No pain Home Living/Prior Functioning Home Living Available Help at Discharge: Family;Available 24 hours/day Type of Home: House Home Access: Stairs to enter [employer] Steps: 1 Entrance Stairs-Rails: None Home Layout: One level Bathroom Shower/Tub: [employer] programmerandicapped height Additional Comments: Son and daughter who lives nextdoor  Lives With: Alone Prior Function Level of Independence: Requires assistive device for independence  Able to Take Stairs?: Yes Driving: Yes Vocation: Retired Comments: using SPCCanoocheetermittently due to hip pain;Was going to OP PT for LLE injury (pulled hip tendon) ADLs, IADLs, driving. Enjoys reading and goes for walks. Vision/Perception  Perception Perception:  Within Functional Limits Praxis Praxis: Intact  Cognition Overall Cognitive Status: Within Functional Limits for tasks assessed Orientation Level: Oriented X4 Safety/Judgment: Appears intact Sensation Sensation Light Touch: Impaired Detail Peripheral sensation comments: 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 Light Touch Impaired Details: Impaired LLE Coordination Gross Motor Movements are Fluid and Coordinated: Yes Fine Motor Movements are Fluid and Coordinated: Yes Finger Nose Finger Test: unable to complete on the L Heel Shin Test: [facility] Motor  Motor Motor: Within Functional Limits Motor - Skilled Clinical Observations: pain limiting some movements  Mobility Bed Mobility Bed Mobility: Supine to Sit;Sit to Supine Supine to Sit: Minimal Assistance - Patient > 75% Sit to Supine: Minimal Assistance - Patient > 75% Transfers Transfers: Sit to Stand;Stand Pivot Transfers Sit to Stand: Minimal Assistance - Patient > 75%;Moderate Assistance - Patient 50-74% Stand Pivot Transfers: Minimal Assistance - Patient > 75%;Moderate Assistance - Patient 50 - 74% Transfer (Assistive device): Left platform walker Locomotion  Gait Ambulation: Yes Gait Assistance: Minimal Assistance - Patient > 75% Gait Distance (Feet): 10 Feet Assistive device: Hemi-walker Gait Gait: Yes Gait Pattern: Impaired Gait Pattern: Step-to pattern;Narrow base of support Stairs / Additional Locomotion Stairs: No Wheelchair Mobility Wheelchair Mobility: Yes Wheelchair Assistance: Minimal assistance - Patient >75% Wheelchair Propulsion: Both lower extermities Wheelchair Parts Management: Needs assistance Distance: 150  Trunk/Postural Assessment  Cervical Assessment Cervical Assessment: Within Functional Limits Thoracic Assessment Thoracic Assessment: Within Functional Limits Lumbar Assessment Lumbar Assessment: Exceptions to WFL(decreased mobility 2/2 pain') Postural Control Postural Control:  Within Functional Limits  Balance Balance Balance Assessed: Yes Static Sitting Balance Static Sitting - Level of Assistance: 5: Stand by assistance Dynamic Sitting Balance Dynamic Sitting - Level of Assistance: 4: Min assist Static Standing Balance Static Standing - Balance Support: Right upper extremity supported Static Standing - Level of Assistance: 4: Min assist Dynamic Standing Balance Dynamic  Standing - Balance Support: Right upper extremity supported Dynamic Standing - Level of Assistance: 3: Mod assist;4: Min assist Extremity Assessment      RLE Assessment RLE Assessment: Within Functional Limits General Strength Comments: grosslt 4/5 to 4+/5 proximal to distal LLE Assessment LLE Assessment: Within Functional Limits General Strength Comments: grosslt 4/5 to 4+/5 proximal to distal    Refer to Care Plan for Long Term Goals  Recommendations for other services: Therapeutic Recreation  Stress management and Outing/community reintegration  Discharge Criteria: Patient will be discharged from PT if patient refuses treatment 3 consecutive times without medical reason, if treatment goals not met, if there is a change in medical status, if patient makes no progress towards goals or if patient is discharged from hospital.  The above assessment, treatment plan, treatment alternatives and goals were discussed and mutually agreed upon: by patient  Lorie Phenix 01/15/2020, 10:05 AM

## 2020-01-15 NOTE — Plan of Care (Signed)
  Problem: Sit to Stand Goal: LTG:  Patient will perform sit to stand in prep for activites of daily living with assistance level (OT) Description: LTG:  Patient will perform sit to stand in prep for activites of daily living with assistance level (OT) Flowsheets (Taken 01/15/2020 1606) LTG: PT will perform sit to stand in prep for activites of daily living with assistance level: Supervision/Verbal cueing   Problem: RH Bathing Goal: LTG Patient will bathe all body parts with assist levels (OT) Description: LTG: Patient will bathe all body parts with assist levels (OT) Flowsheets (Taken 01/15/2020 1606) LTG: Pt will perform bathing with assistance level/cueing: Minimal Assistance - Patient > 75%   Problem: RH Dressing Goal: LTG Patient will perform upper body dressing (OT) Description: LTG Patient will perform upper body dressing with assist, with/without cues (OT). Flowsheets (Taken 01/15/2020 1606) LTG: Pt will perform upper body dressing with assistance level of: Supervision/Verbal cueing Goal: LTG Patient will perform lower body dressing w/assist (OT) Description: LTG: Patient will perform lower body dressing with assist, with/without cues in positioning using equipment (OT) Flowsheets (Taken 01/15/2020 1606) LTG: Pt will perform lower body dressing with assistance level of: Minimal Assistance - Patient > 75%   Problem: RH Toileting Goal: LTG Patient will perform toileting task (3/3 steps) with assistance level (OT) Description: LTG: Patient will perform toileting task (3/3 steps) with assistance level (OT)  Flowsheets (Taken 01/15/2020 1606) LTG: Pt will perform toileting task (3/3 steps) with assistance level: Minimal Assistance - Patient > 75%   Problem: RH Toilet Transfers Goal: LTG Patient will perform toilet transfers w/assist (OT) Description: LTG: Patient will perform toilet transfers with assist, with/without cues using equipment (OT) Flowsheets (Taken 01/15/2020 1606) LTG: Pt will  perform toilet transfers with assistance level of: Supervision/Verbal cueing

## 2020-01-15 NOTE — Plan of Care (Signed)
  Problem: RH Balance Goal: LTG Patient will maintain dynamic sitting balance (PT) Description: LTG:  Patient will maintain dynamic sitting balance with assistance during mobility activities (PT) Flowsheets (Taken 01/15/2020 1014) LTG: Pt will maintain dynamic sitting balance during mobility activities with:: Independent with assistive device  Goal: LTG Patient will maintain dynamic standing balance (PT) Description: LTG:  Patient will maintain dynamic standing balance with assistance during mobility activities (PT) Flowsheets (Taken 01/15/2020 1014) LTG: Pt will maintain dynamic standing balance during mobility activities with:: Supervision/Verbal cueing   Problem: RH Bed Mobility Goal: LTG Patient will perform bed mobility with assist (PT) Description: LTG: Patient will perform bed mobility with assistance, with/without cues (PT). Flowsheets (Taken 01/15/2020 1014) LTG: Pt will perform bed mobility with assistance level of: Supervision/Verbal cueing   Problem: RH Bed to Chair Transfers Goal: LTG Patient will perform bed/chair transfers w/assist (PT) Description: LTG: Patient will perform bed to chair transfers with assistance (PT). Flowsheets (Taken 01/15/2020 1014) LTG: Pt will perform Bed to Chair Transfers with assistance level: Supervision/Verbal cueing   Problem: RH Car Transfers Goal: LTG Patient will perform car transfers with assist (PT) Description: LTG: Patient will perform car transfers with assistance (PT). Flowsheets (Taken 01/15/2020 1014) LTG: Pt will perform car transfers with assist:: Supervision/Verbal cueing   Problem: RH Ambulation Goal: LTG Patient will ambulate in controlled environment (PT) Description: LTG: Patient will ambulate in a controlled environment, # of feet with assistance (PT). Flowsheets (Taken 01/15/2020 1014) LTG: Pt will ambulate in controlled environ  assist needed:: Supervision/Verbal cueing LTG: Ambulation distance in controlled environment: 73ft with  LRAD Goal: LTG Patient will ambulate in home environment (PT) Description: LTG: Patient will ambulate in home environment, # of feet with assistance (PT). Flowsheets (Taken 01/15/2020 1014) LTG: Pt will ambulate in home environ  assist needed:: Supervision/Verbal cueing LTG: Ambulation distance in home environment: 46ft with LRAD   Problem: RH Wheelchair Mobility Goal: LTG Patient will propel w/c in controlled environment (PT) Description: LTG: Patient will propel wheelchair in controlled environment, # of feet with assist (PT) Flowsheets (Taken 01/15/2020 1014) LTG: Pt will propel w/c in controlled environ  assist needed:: Set up assist LTG: Propel w/c distance in controlled environment: 138ft Goal: LTG Patient will propel w/c in home environment (PT) Description: LTG: Patient will propel wheelchair in home environment, # of feet with assistance (PT). Flowsheets (Taken 01/15/2020 1014) LTG: Pt will propel w/c in home environ  assist needed:: Set up assist Distance: wheelchair distance in controlled environment: 50

## 2020-01-15 NOTE — Consult Note (Signed)
Ref: [name], MD   Subjective:  Awake. Sitting up. VS stable. HR down to 50's. Potassium down to 3.5  Objective:  Vital Signs in the last 24 hours: Temp:  [97.8 F (36.6 C)-98.4 F (36.9 C)] 98.4 F (36.9 C) (03/06 0538) Pulse Rate:  [58-106] 58 (03/06 0837) Resp:  [16-19] 18 (03/06 0538) BP: (100-163)/(55-89) 122/60 (03/06 0837) SpO2:  [97 %-98 %] 97 % (03/06 0538) Weight:  [69.4 kg] 69.4 kg (03/05 1651)  Physical Exam: BP Readings from Last 1 Encounters:  01/15/20 122/60     Wt Readings from Last 1 Encounters:  01/14/20 69.4 kg    Weight change:  Body mass index is 24.69 kg/m. HEENT: [place]/AT, Eyes-Blue, PERL, EOMI, Conjunctiva-Pink, Sclera-Non-icteric Neck: No JVD, No bruit, Trachea midline. Extremities:  No edema present. No cyanosis. No clubbing. CNS: AxOx3, Cranial nerves grossly intact, moves all 4 extremities.  Skin: Warm and dry.   Intake/Output from previous day: 03/05 0701 - 03/06 0700 In: 240 [P.O.:240] Out: -     Lab Results: BMET    Component Value Date/Time   NA 143 01/15/2020 0541   NA 141 01/14/2020 0100   NA 142 01/13/2020 0106   K 3.5 01/15/2020 0541   K 3.8 01/14/2020 0100   K 3.7 01/13/2020 0106   CL 104 01/15/2020 0541   CL 106 01/14/2020 0100   CL 106 01/13/2020 0106   CO2 26 01/15/2020 0541   CO2 23 01/14/2020 0100   CO2 25 01/13/2020 0106   GLUCOSE 99 01/15/2020 0541   GLUCOSE 91 01/14/2020 0100   GLUCOSE 92 01/13/2020 0106   BUN 10 01/15/2020 0541   BUN 11 01/14/2020 0100   BUN 10 01/13/2020 0106   CREATININE 0.93 01/15/2020 0541   CREATININE 1.05 (H) 01/14/2020 0100   CREATININE 0.83 01/13/2020 0106   CREATININE 0.95 (H) 01/26/2016 1552   CALCIUM 8.8 (L) 01/15/2020 0541   CALCIUM 8.8 (L) 01/14/2020 0100   CALCIUM 8.6 (L) 01/13/2020 0106   GFRNONAA 53 (L) 01/15/2020 0541   GFRNONAA 45 (L) 01/14/2020 0100   GFRNONAA >60 01/13/2020 0106   GFRAA >60 01/15/2020 0541   GFRAA 53 (L) 01/14/2020 0100   GFRAA >60  01/13/2020 0106   CBC    Component Value Date/Time   WBC 12.1 (H) 01/15/2020 0541   RBC 3.61 (L) 01/15/2020 0541   HGB 11.0 (L) 01/15/2020 0541   HCT 34.6 (L) 01/15/2020 0541   PLT 641 (H) 01/15/2020 0541   MCV 95.8 01/15/2020 0541   MCH 30.5 01/15/2020 0541   MCHC 31.8 01/15/2020 0541   RDW 15.6 (H) 01/15/2020 0541   LYMPHSABS 2.0 01/15/2020 0541   MONOABS 1.0 01/15/2020 0541   EOSABS 0.2 01/15/2020 0541   BASOSABS 0.0 01/15/2020 0541   HEPATIC Function Panel Recent Labs    06/22/19 1112 01/06/20 0454 01/15/20 0541  PROT 6.6 4.9* 5.7*   HEMOGLOBIN A1C No components found for: HGA1C,  MPG CARDIAC ENZYMES Lab Results  Component Value Date   TROPONINI <0.03 03/04/2016   BNP No results for input(s): PROBNP in the last 8760 hours. TSH Recent Labs    06/22/19 1112 01/07/20 1036  TSH 1.42 0.187*   CHOLESTEROL Recent Labs    06/22/19 1112  CHOL 190    Scheduled Meds: . [START ON 01/16/2020] amiodarone  100 mg Oral Daily  . amoxicillin-clavulanate  1 tablet Oral Q12H  . apixaban  5 mg Oral BID  . vitamin C  250 mg Oral BID  .  bethanechol  5 mg Oral TID  . diltiazem  120 mg Oral Daily  . feeding supplement (PRO-STAT SUGAR FREE 64)  30 mL Oral BID  . levothyroxine  50 mcg Oral QAC breakfast  . metoprolol tartrate  25 mg Oral BID  . multivitamin with minerals  1 tablet Oral Daily  . pantoprazole  40 mg Oral Q1200  . potassium chloride  10 mEq Oral Daily  . predniSONE  10 mg Oral Q breakfast  . saccharomyces boulardii  250 mg Oral BID  . vitamin A  10,000 Units Oral Daily   Continuous Infusions: PRN Meds:.acetaminophen, alum & mag hydroxide-simeth, bisacodyl, diphenhydrAMINE, guaiFENesin-dextromethorphan, lidocaine, oxyCODONE, polyethylene glycol, prochlorperazine **OR** prochlorperazine **OR** prochlorperazine, senna-docusate, sodium phosphate, traZODone  Assessment/Plan: Chronic atrial fibrillation Acute perforated diverticulitis with peritonitis S/P  Hartmann's procedure Anemia of blood loss, stable HTN Hypothyroidism Polymyalgia rheumatica  Decrease amiodarone by 50 % to 100 mg. daily for mild bradycardia. Potassium supplement.   LOS: 1 day   Time spent including chart review, lab review, examination, discussion with patient : 25 min   Dixie Dials  MD  01/15/2020, 2:59 PM

## 2020-01-15 NOTE — Evaluation (Signed)
Occupational Therapy Assessment and Plan  Patient Details  Name: [name] MRN: [id number] Date of Birth: [birth year]  OT Diagnosis: abnormal posture, acute pain, muscle weakness (generalized) and swelling of limb Rehab Potential: Rehab Potential (ACUTE ONLY): Good ELOS: 10-12 days   Today's Date: 01/15/2020 OT Individual Time: 9562-1308 and 1405-1505 OT Individual Time Calculation (min): 61 min and 60 min  Problem List:  Patient Active Problem List   Diagnosis Date Noted  . Debility 01/14/2020  . Physical debility 01/14/2020  . Trauma   . Postoperative pain   . Urinary retention   . New onset atrial fibrillation ([place])   . Protein-calorie malnutrition, severe 01/13/2020  . Acute renal failure superimposed on stage 3a chronic kidney disease ([place]) 12/29/2019  . Closed fracture of multiple ribs 12/29/2019  . Closed fracture of left distal radius 12/29/2019  . Recurrent falls 12/29/2019  . Positive ANA (antinuclear antibody) 12/29/2019  . Osteoarthritis 11/13/2017  . Hypertriglyceridemia 01/29/2017  . Excessive gas 01/09/2017  . Preoperative cardiovascular examination 07/31/2016  . Elevated lipase 03/06/2016  . Osteopenia 02/08/2016  . Hypothyroidism 01/26/2016  . HTN (hypertension) 01/26/2016  . Left leg numbness 10/06/2012    Past Medical History:  Past Medical History:  Diagnosis Date  . DVT (deep venous thrombosis) ([place]) 09/2015   RLE  . GERD (gastroesophageal reflux disease)   . Hypertension   . Hypothyroidism   . Melanoma ([place]) 06/16/2017   Facial melanoma, removed by Dr. Harvel Quale  . Peripheral vascular disease ([place])    peripheral neuropathy   Past Surgical History:  Past Surgical History:  Procedure Laterality Date  . ABDOMINAL HYSTERECTOMY    . APPENDECTOMY    . BACK SURGERY    . BREAST SURGERY     left breast biopsy  . COLOSTOMY N/A 01/06/2020   Procedure: Colostomy;  Surgeon: Greer Pickerel, MD;  Location: [facility];  Service: General;  Laterality: N/A;  .  EYE SURGERY     cataract  . HARDWARE REMOVAL Left 01/01/2020   Procedure: HARDWARE REMOVAL;  Surgeon: Iran Planas, MD;  Location: [facility];  Service: Orthopedics;  Laterality: Left;  . LAPAROTOMY N/A 01/06/2020   Procedure: Exploratory Laparotomy, Open Sigmoid colectomy;  Surgeon: Greer Pickerel, MD;  Location: [facility];  Service: General;  Laterality: N/A;  . LIPOMA EXCISION Left 04/25/2015   Procedure: EXCISION OF LIPOMA LEFT ARM;  Surgeon: Donnie Mesa, MD;  Location: [facility];  Service: General;  Laterality: Left;  . LUMBAR LAMINECTOMY/DECOMPRESSION MICRODISCECTOMY  11/20/2011   Procedure: LUMBAR LAMINECTOMY/DECOMPRESSION MICRODISCECTOMY;  Surgeon: Jeneen Rinks P [place];  Location: [facility];  Service: Orthopedics;  Laterality: N/A;  Decompressive Laminectomy L2 to the Sacrum (X-Ray)  . OPEN REDUCTION INTERNAL FIXATION (ORIF) DISTAL RADIAL FRACTURE Left 01/01/2020   Procedure: OPEN REDUCTION INTERNAL FIXATION (ORIF) DISTAL RADIAL FRACTURE;  Surgeon: Iran Planas, MD;  Location: [facility];  Service: Orthopedics;  Laterality: Left;  . OTHER SURGICAL HISTORY     left wrist surgery - has plate in left wrist   . OTHER SURGICAL HISTORY     right knee surgery due to torn cartilage   . TONSILLECTOMY    . WRIST FRACTURE SURGERY Left     Assessment & Plan Clinical Impression:  [name] is a 86 year old female with history of HTN, melanoma, peripheral neuropathy, HTN, CKD, left hip pain, PMR- new diagnosis, who was admitted on 12/29/2019 with nausea, vomiting, diarrhea, and dizziness resulting in a fall with left wrist pain.  History taken  from chart review and patient.  Work-up revealed AKI on CKD with left rib and distal radius fracture proximal to prior ORIF.  Echocardiogram showed ejection fraction of 50%  with apical septal and apical anterior hypokinesis.  Dr. Angelena Form was consulted for input and felt that dizziness was due to dehydration and no further cardiac work-up needed.  She underwent  removal of left distal radius hardware with internal fixation of left radial shaft fracture by Dr. Caralyn Guile on 02/20 and postop to be NWB on LUE.  She developed abdominal pain due to perorated sigmoid diverticulitis with pneumoperitoneum and required exploratory lap with sigmoid colectomy and end colostomy by Dr. Redmond Pulling on 01/06/2020.  Hospital course further complicated by urinary retention and a Foley was placed for decompression.  She developed atrial fibrillation with RVR on 02/26 and treated with BB and IV heparin  Per Dr. Doylene Canard.  Treated with IV antibiotics for gross purulent peritonitis and leukocytosis being monitored.  Follow-up CT done on 03/02 due to rise in WBC to 20.3 and showed  2 fluid collection in LLQ right posterior pelvic.   IR consulted for input and felt that small collections were due to blood rather than abscess and recommended repeat CT abdomen in 72 hours.  She was cleared to transition to Eliquis and Zosyn changed to Augmentin X 7 days as leukocytosis was resolving. CCS felt that repeat CT abdomen not needed. She has been tolerating full liquids with decrease in abdominal pain-->diet advanced to soft's today. She continues to have bouts of A flutter and Cardizem added on 03/04. Therapy ongoing and CIR recommended due to functional decline.  Please see preadmission assessment from earlier today as well.  Patient currently requires Mod-Max with basic self-care skills secondary to muscle weakness, decreased cardiorespiratoy endurance and decreased standing balance, difficulty maintaining precautions and pain.  Prior to hospitalization, patient could complete BADLs with independent .  Patient will benefit from skilled intervention to increase independence with basic self-care skills prior to discharge home with family support.  Anticipate patient will require 24 hour supervision and minimal physical assistance and follow up home health.  OT - End of Session Endurance Deficit: Yes OT  Assessment Rehab Potential (ACUTE ONLY): Good OT Barriers to Discharge: Medical stability;Wound Care;Weight bearing restrictions;Behavior OT Patient demonstrates impairments in the following area(s): Balance;Behavior;Safety;Sensory;Edema;Skin Integrity;Endurance;Motor;Pain OT Basic ADL's Functional Problem(s): Grooming;Bathing;Dressing;Toileting OT Advanced ADL's Functional Problem(s): Simple Meal Preparation;Laundry OT Transfers Functional Problem(s): Toilet;Tub/Shower OT Additional Impairment(s): Fuctional Use of Upper Extremity OT Plan OT Intensity: Minimum of 1-2 x/day, 45 to 90 minutes OT Frequency: 5 out of 7 days OT Duration/Estimated Length of Stay: 10-12 days OT Treatment/Interventions: Balance/vestibular training;Discharge planning;Pain management;Self Care/advanced ADL retraining;Therapeutic Activities;UE/LE Coordination activities;Therapeutic Exercise;Skin care/wound managment;Patient/family education;Functional mobility training;Disease mangement/prevention;Community reintegration;DME/adaptive equipment instruction;Psychosocial support;Splinting/orthotics;UE/LE Strength taining/ROM;Wheelchair propulsion/positioning OT Self Feeding Anticipated Outcome(s): No goal OT Basic Self-Care Anticipated Outcome(s): Supervision-Min A OT Toileting Anticipated Outcome(s): Min A OT Bathroom Transfers Anticipated Outcome(s): Supervision OT Recommendation Recommendations for Other Services: Neuropsych consult Patient destination: Home Follow Up Recommendations: 24 hour supervision/assistance;Home health OT Equipment Recommended: To be determined Skilled Therapeutic Intervention Skilled OT session completed with focus on initial evaluation, education on OT role/POC, and establishment of patient-centered goals.   Pt greeted in the w/c, premedicated for L UE pain. She exhibited symptoms of anxiousness and OT provided calming cues so that pt could complete self care tasks at max level of  independence during session. Started with stand pivot toilet transfer using the hemi walker. Pt needed Mod A  for power up and then pivoted over to elevated toilet with Min A and vcs to not put her Lt hand on the hemi walker due to NWB status. Pt with bladder incontinence on floor, resumed bladder void while sitting on toilet. She declined to put on a brief, adamant that she wanted to wear underwear. Dressing tasks completed sit<stand at the sink afterwards. Pt visibly self limiting and anxious, Max A for UB dressing and LB self care. Mod A for sit<stands and Min A for dynamic standing balance with OT keeping Lt arm cast off of sink. At end of session she stood at the sink and OT pulled the recliner behind her. She had been c/o throughout session of bad fit and discomfort her her w/c. Pt reported improved comfort in recliner with L UE supported and prop for her back. Left her comfortably in the recliner with all needs within reach.   2nd Session 1:1 tx (60 min) Pt greeted in the recliner with dtr Jana Half present. Dtr asked several appropriate questions pertaining to clothing to bring, DME for home, ELOS, and overall rehab process. Answered these questions during session with pt most likely needing 3:1 before d/c, pt already having shower chair + grab bars installed in shower. We also discussed benefits of wearing a brief during CIR stay with dtr helping OT to encourage pt to wear one for safety. Pt agreeable. OT retrieved a BSC and pt ambulated a short distance from recliner to the opposite side of the bed with hemi walker and Min A, Mod A for initial power up. Pt needs vcs for hemi walker mgt and safe placement. Stand pivot<BSC completed with Min A from elevated bed using device. Min A for dynamic standing balance with pt needing facilitation to keep her Lt arm off of hemi walker for maintaining NWB status. Pt with continent bladder void and completed hygiene while standing with Min balance assist. Mod A for  clothing mgt on the Lt side. Min A for stand pivot<EOB<recliner. OT retrieved a MHP for her lower back due to increased pain. Discussed with pt and dtr timing pain medicine with RN so that she can have her pain pill before therapy and maximize participation during session. Discussed with RN order of k-pad to help with pain mgt as well and she was in agreement. OT also retrieved a larger w/c for pt to increase comfort and ease of Lt arm support when OOB. Lt half lap trays were not in supply however she may benefit from one. Pt remained in recliner, in care of RN to receive pain medicine for her lower back.   OT Evaluation Precautions/Restrictions  Precautions Precautions: Fall Precaution Comments: ostomy, abdominal wound Restrictions Weight Bearing Restrictions: Yes LUE Weight Bearing: Non weight bearing Vital Signs Therapy Vitals Pulse Rate: (!) 58 BP: 122/60 Pain Pain Assessment Pain Scale: 0-10 Pain Score: 0-No pain Home Living/Prior Functioning Home Living Family/patient expects to be discharged to:: Private residence Living Arrangements: Alone Available Help at Discharge: Family, Available 24 hours/day Type of Home: House Home Access: Stairs to enter Technical brewer of Steps: 1 Entrance Stairs-Rails: None Home Layout: One level Bathroom Shower/Tub: Multimedia programmer: Handicapped height Bathroom Accessibility: Yes Additional Comments: Son and daughter lives nextdoor  Lives With: Alone IADL History Homemaking Responsibilities: Yes Meal Prep Responsibility: Therapist, occupational Responsibility: Primary Cleaning Responsibility: Primary Shopping Responsibility: Primary Occupation: Retired Type of Occupation: Charity fundraiser Leisure and Hobbies: Reading Prior Function Level of Independence: Independent with basic ADLs, Independent with homemaking  with ambulation  Able to Take Stairs?: Yes Driving: Yes ADL ADL Eating: Not assessed Grooming: Maximal  assistance Where Assessed-Grooming: Sitting at sink Upper Body Bathing: Moderate assistance Where Assessed-Upper Body Bathing: Sitting at sink Lower Body Bathing: Moderate assistance Where Assessed-Lower Body Bathing: Sitting at sink, Standing at sink Upper Body Dressing: Maximal assistance Where Assessed-Upper Body Dressing: Sitting at sink Lower Body Dressing: Maximal assistance Where Assessed-Lower Body Dressing: Sitting at sink, Standing at sink Toileting: Moderate assistance Where Assessed-Toileting: Glass blower/designer: Moderate assistance Toilet Transfer Method: Stand pivot(with hemi walker) Toilet Transfer Equipment: Raised toilet seat Tub/Shower Transfer: Not assessed Vision Baseline Vision/History: Wears glasses Wears Glasses: Distance only Patient Visual Report: No change from baseline Vision Assessment?: No apparent visual deficits Perception  Perception: Within Functional Limits Praxis Praxis: Intact Cognition Overall Cognitive Status: Within Functional Limits for tasks assessed Arousal/Alertness: Awake/alert Orientation Level: Person;Place;Situation Place: Oriented Situation: Oriented Year: 2021 Month: March Day of Week: Correct Memory: Appears intact Immediate Memory Recall: Sock;Blue;Bed Memory Recall Sock: Without Cue Memory Recall Blue: Without Cue Memory Recall Bed: Without Cue Attention: Sustained Sustained Attention: Appears intact Awareness: Appears intact Problem Solving: Impaired Safety/Judgment: Appears intact Sensation Sensation Light Touch: Impaired Detail Peripheral sensation comments: 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, per pt this is baseline post back surgery a few years ago Light Touch Impaired Details: Impaired LLE Coordination Gross Motor Movements are Fluid and Coordinated: Yes Fine Motor Movements are Fluid and Coordinated: No(Fine motor abilities limited on the Lt due to pain, pt needing Mod A to button her  button up shirt) Finger Nose Finger Test: unable to complete on the L, WNL Rt Heel Shin Test: [facility] Motor  Motor Motor: Other (comment) Motor - Skilled Clinical Observations: Affected by pain Mobility  Bed Mobility Bed Mobility: Supine to Sit;Sit to Supine Supine to Sit: Minimal Assistance - Patient > 75% Sit to Supine: Minimal Assistance - Patient > 75% Transfers Sit to Stand: Minimal Assistance - Patient > 75%;Moderate Assistance - Patient 50-74%  Trunk/Postural Assessment  Cervical Assessment Cervical Assessment: Within Functional Limits Thoracic Assessment Thoracic Assessment: Within Functional Limits Lumbar Assessment Lumbar Assessment: Exceptions to WFL(decreased mobility 2/2 pain') Postural Control Postural Control: Within Functional Limits  Balance Balance Balance Assessed: Yes Static Sitting Balance Static Sitting - Level of Assistance: 5: Stand by assistance Dynamic Sitting Balance Dynamic Sitting - Balance Support: During functional activity Dynamic Sitting - Level of Assistance: 4: Min assist(attempting to thread Lt LE into LB garments) Static Standing Balance Static Standing - Balance Support: Right upper extremity supported Static Standing - Level of Assistance: 4: Min assist Dynamic Standing Balance Dynamic Standing - Balance Support: No upper extremity supported;During functional activity Dynamic Standing - Level of Assistance: 4: Min assist Dynamic Standing - Balance Activities: Lateral lean/weight shifting;Forward lean/weight shifting(completing perihygiene while standing at the sink) Extremity/Trunk Assessment RUE Assessment RUE Assessment: Within Functional Limits General Strength Comments: 3+/5 grossly LUE Assessment LUE Assessment: Exceptions to [facility] Passive Range of Motion (PROM) Comments: Pt unable to tolerate due to pain Active Range of Motion (AROM) Comments: Very limited by pain, ~40 degrees shoulder flexion, minimal elbow extension, no active  forearm supination     Refer to Care Plan for Long Term Goals  Recommendations for other services: Neuropsych   Discharge Criteria: Patient will be discharged from OT if patient refuses treatment 3 consecutive times without medical reason, if treatment goals not met, if there is a change in medical status, if patient makes no progress towards goals  or if patient is discharged from hospital.  The above assessment, treatment plan, treatment alternatives and goals were discussed and mutually agreed upon: by patient  Skeet Simmer 01/15/2020, 12:30 PM

## 2020-01-15 NOTE — Progress Notes (Signed)
[facility] PHYSICAL MEDICINE & REHABILITATION PROGRESS NOTE   Subjective/Complaints: Has no complaints this morning. Daughter is at bedside.  Denies pain, constipation, insomnia. Labs stable this morning.    Objective:   No results found. Recent Labs    01/14/20 0100 01/15/20 0541  WBC 12.3* 12.1*  HGB 10.9* 11.0*  HCT 34.1* 34.6*  PLT 609* 641*   Recent Labs    01/14/20 0100 01/15/20 0541  NA 141 143  K 3.8 3.5  CL 106 104  CO2 23 26  GLUCOSE 91 99  BUN 11 10  CREATININE 1.05* 0.93  CALCIUM 8.8* 8.8*    Intake/Output Summary (Last 24 hours) at 01/15/2020 1611 Last data filed at 01/15/2020 1200 Gross per 24 hour  Intake 600 ml  Output 100 ml  Net 500 ml     Physical Exam: Vital Signs Blood pressure (!) 118/55, pulse 82, temperature 98.1 F (36.7 C), temperature source Oral, resp. rate 18, height 5\' 6"  (1.676 m), weight 69.4 kg, SpO2 96 %. Vitals reviewed. Constitutional: She appears well-developed. Sitting up in bed, daughter is at bedside.  Obese  HENT:  Head: Normocephalic and atraumatic.  Eyes: EOM are normal. Right eye exhibits no discharge. Left eye exhibits no discharge.  Neck: No tracheal deviation present. No thyromegaly present.  Respiratory: Effort normal. No respiratory distress.  GI: Soft. She exhibits no distension.  + Ostomy  Musculoskeletal:     Comments: Left upper extremity with edema  Neurological: She is alert.  Motor: Right upper extremity: 4/5 proximally, 5/5 handgrip  Left upper extremity: Proximally limited by cast, handgrip 4+/5 Bilateral lower extremities: Hip flexion, knee extension 4 medicine/5, ankle dorsiflexion 5/5  Skin:  Left upper extremity with dressing C/D/I  Psychiatric: She has a normal mood and affect. Her behavior is normal.   Assessment/Plan: 1. Functional deficits secondary to multi-- Ortho. which require 3+ hours per day of interdisciplinary therapy in a comprehensive inpatient rehab setting.  Physiatrist is  providing close team supervision and 24 hour management of active medical problems listed below.  Physiatrist and rehab team continue to assess barriers to discharge/monitor patient progress toward functional and medical goals  Care Tool:  Bathing    Body parts bathed by patient: Chest, Front perineal area, Buttocks, Right upper leg, Left upper leg, Face   Body parts bathed by helper: Right arm, Right lower leg, Left lower leg Body parts n/a: Left arm, Abdomen   Bathing assist Assist Level: Moderate Assistance - Patient 50 - 74%     Upper Body Dressing/Undressing Upper body dressing   What is the patient wearing?: Button up shirt    Upper body assist Assist Level: Maximal Assistance - Patient 25 - 49%    Lower Body Dressing/Undressing Lower body dressing      What is the patient wearing?: Underwear/pull up, Pants     Lower body assist Assist for lower body dressing: Maximal Assistance - Patient 25 - 49%     Toileting Toileting    Toileting assist Assist for toileting: Moderate Assistance - Patient 50 - 74%     Transfers Chair/bed transfer  Transfers assist     Chair/bed transfer assist level: Moderate Assistance - Patient 50 - 74%     Locomotion Ambulation   Ambulation assist      Assist level: Minimal Assistance - Patient > 75% Assistive device: Walker-hemi Max distance: 10   Walk 10 feet activity   Assist     Assist level: Minimal Assistance - Patient >  75% Assistive device: Walker-hemi   Walk 50 feet activity   Assist Walk 50 feet with 2 turns activity did not occur: Safety/medical concerns         Walk 150 feet activity   Assist Walk 150 feet activity did not occur: Safety/medical concerns         Walk 10 feet on uneven surface  activity   Assist Walk 10 feet on uneven surfaces activity did not occur: Safety/medical concerns         Wheelchair     Assist Will patient use wheelchair at discharge?: Yes Type of  Wheelchair: Manual    Wheelchair assist level: Minimal Assistance - Patient > 75% Max wheelchair distance: 150    Wheelchair 50 feet with 2 turns activity    Assist        Assist Level: Minimal Assistance - Patient > 75%   Wheelchair 150 feet activity     Assist      Assist Level: Minimal Assistance - Patient > 75%   Blood pressure (!) 118/55, pulse 82, temperature 98.1 F (36.7 C), temperature source Oral, resp. rate 18, height 5\' 6"  (1.676 m), weight 69.4 kg, SpO2 96 %.  Medical Problem List and Plan: 1.  Deficits with mobility, transfers, self-care secondary to multi-- Ortho.             -patient may not shower             -ELOS/Goals: 4-7 days/supervision/min a             CIR initial evaluations 2.  Antithrombotics: -DVT/anticoagulation:  Pharmaceutical: Other (comment)--Elquis             -antiplatelet therapy: N/A 3. Pain Management: Continue Oxycodone prn             Monitor with increased exertion. Well controlled.  4. Mood: LCSW to follow for evaluation and support.              -antipsychotic agents: N/a 5. Neuropsych: This patient is capable of making decisions on her own behalf. 6. Skin/Wound Care: Wet to dry dressings daily. Add vitamin C/multivitamin to promote wound healing.  7. Fluids/Electrolytes/Nutrition: Started on soft diet.  Advance diet as tolerated offer supplements between meals. Electrolytes stable 3/6. 8.  Left wrist fracture s/p internal fixation: NWB LUE--long arm splint X 2 weeks followed by suture removal and long arm cast for  immobilization X 2 additional weeks.   9. Perforated diverticulum s/p repair with colostomy:  Family refusing education and patient unable to perform care due to right wrist fracture.   Need to  Educate/encourage patient and family on care.  10. Purulent peritonitis: Zosyn changed to Augmentin X 7 days. CCS does not feel repeat CT abdomen/pelvis needed.  11. New onset A fib with RVR: ON amiodarone, metoprolol  and Cardizem (added on 03/4).              Monitor with increased activity. Well controlled.  12. Polymyalgia rheumatica: On prednisone--on vitamin A to mitigate steroid effect on wound healing.  13. Urinary retention: Monitor for retention.  PVRs ordered. 14. Hypothyroid: Continue Synthroid.       LOS: 1 days A FACE TO FACE EVALUATION WAS PERFORMED  Martha Clan P Alesana Magistro 01/15/2020, 4:11 PM

## 2020-01-16 LAB — CBC
HCT: 31.6 % — ABNORMAL LOW (ref 36.0–46.0)
Hemoglobin: 9.9 g/dL — ABNORMAL LOW (ref 12.0–15.0)
MCH: 30.1 pg (ref 26.0–34.0)
MCHC: 31.3 g/dL (ref 30.0–36.0)
MCV: 96 fL (ref 80.0–100.0)
Platelets: 587 10*3/uL — ABNORMAL HIGH (ref 150–400)
RBC: 3.29 MIL/uL — ABNORMAL LOW (ref 3.87–5.11)
RDW: 15.7 % — ABNORMAL HIGH (ref 11.5–15.5)
WBC: 11.8 10*3/uL — ABNORMAL HIGH (ref 4.0–10.5)
nRBC: 0 % (ref 0.0–0.2)

## 2020-01-16 NOTE — Progress Notes (Signed)
[facility] PHYSICAL MEDICINE & REHABILITATION PROGRESS NOTE   Subjective/Complaints: Has no complaints this morning. Slept well last night. Had a nice visit with her daughter.  Denies pain, insomnia Labs stable this morning, with the exception of slightly decreased hemoglobin.    Objective:   No results found. Recent Labs    01/15/20 0541 01/16/20 0524  WBC 12.1* 11.8*  HGB 11.0* 9.9*  HCT 34.6* 31.6*  PLT 641* 587*   Recent Labs    01/14/20 0100 01/15/20 0541  NA 141 143  K 3.8 3.5  CL 106 104  CO2 23 26  GLUCOSE 91 99  BUN 11 10  CREATININE 1.05* 0.93  CALCIUM 8.8* 8.8*    Intake/Output Summary (Last 24 hours) at 01/16/2020 1310 Last data filed at 01/16/2020 0934 Gross per 24 hour  Intake 320 ml  Output 100 ml  Net 220 ml     Physical Exam: Vital Signs Blood pressure 129/67, pulse 74, temperature 98 F (36.7 C), temperature source Oral, resp. rate 17, height 5\' 6"  (1.676 m), weight 69.4 kg, SpO2 96 %. Vitals reviewed. Constitutional: She appears well-developed. Sitting on bedside commode Obese  HENT:  Head: Normocephalic and atraumatic.  Eyes: EOM are normal. Right eye exhibits no discharge. Left eye exhibits no discharge.  Neck: No tracheal deviation present. No thyromegaly present.  Respiratory: Effort normal. No respiratory distress.  GI: Soft. She exhibits no distension.  + Ostomy  Musculoskeletal:     Comments: Left upper extremity with edema  Neurological: She is alert.  Motor: Right upper extremity: 4/5 proximally, 5/5 handgrip  Left upper extremity: Proximally limited by cast, handgrip 4+/5 Bilateral lower extremities: Hip flexion, knee extension 4 medicine/5, ankle dorsiflexion 5/5  Skin:  Left upper extremity with dressing C/D/I  Psychiatric: She has a normal mood and affect. Her behavior is normal.   Assessment/Plan: 1. Functional deficits secondary to multi-- Ortho. which require 3+ hours per day of interdisciplinary therapy in a  comprehensive inpatient rehab setting.  Physiatrist is providing close team supervision and 24 hour management of active medical problems listed below.  Physiatrist and rehab team continue to assess barriers to discharge/monitor patient progress toward functional and medical goals  Care Tool:  Bathing    Body parts bathed by patient: Chest, Front perineal area, Buttocks, Right upper leg, Left upper leg, Face   Body parts bathed by helper: Right arm, Right lower leg, Left lower leg Body parts n/a: Left arm, Abdomen   Bathing assist Assist Level: Moderate Assistance - Patient 50 - 74%     Upper Body Dressing/Undressing Upper body dressing   What is the patient wearing?: Button up shirt    Upper body assist Assist Level: Maximal Assistance - Patient 25 - 49%    Lower Body Dressing/Undressing Lower body dressing      What is the patient wearing?: Pants, Underwear/pull up     Lower body assist Assist for lower body dressing: Maximal Assistance - Patient 25 - 49%     Toileting Toileting    Toileting assist Assist for toileting: Moderate Assistance - Patient 50 - 74%     Transfers Chair/bed transfer  Transfers assist     Chair/bed transfer assist level: Moderate Assistance - Patient 50 - 74%     Locomotion Ambulation   Ambulation assist      Assist level: Minimal Assistance - Patient > 75% Assistive device: Walker-hemi Max distance: 10   Walk 10 feet activity   Assist  Assist level: Minimal Assistance - Patient > 75% Assistive device: Walker-hemi   Walk 50 feet activity   Assist Walk 50 feet with 2 turns activity did not occur: Safety/medical concerns         Walk 150 feet activity   Assist Walk 150 feet activity did not occur: Safety/medical concerns         Walk 10 feet on uneven surface  activity   Assist Walk 10 feet on uneven surfaces activity did not occur: Safety/medical concerns         Wheelchair     Assist  Will patient use wheelchair at discharge?: Yes Type of Wheelchair: Manual    Wheelchair assist level: Minimal Assistance - Patient > 75% Max wheelchair distance: 150    Wheelchair 50 feet with 2 turns activity    Assist        Assist Level: Minimal Assistance - Patient > 75%   Wheelchair 150 feet activity     Assist      Assist Level: Minimal Assistance - Patient > 75%   Blood pressure 129/67, pulse 74, temperature 98 F (36.7 C), temperature source Oral, resp. rate 17, height 5\' 6"  (1.676 m), weight 69.4 kg, SpO2 96 %.  Medical Problem List and Plan: 1.  Deficits with mobility, transfers, self-care secondary to multi-- Ortho.             -patient may not shower             -ELOS/Goals: 4-7 days/supervision/min a             Continue CIR PT, OT 2.  Antithrombotics: -DVT/anticoagulation:  Pharmaceutical: Other (comment)--Elquis             -antiplatelet therapy: N/A 3. Pain Management: Continue Oxycodone prn             Monitor with increased exertion. Continues to be well controlled. Received one oxycodone yesterday, one tylenol today.  4. Mood: LCSW to follow for evaluation and support.              -antipsychotic agents: N/a 5. Neuropsych: This patient is capable of making decisions on her own behalf. 6. Skin/Wound Care: Wet to dry dressings daily. Add vitamin C/multivitamin to promote wound healing.  7. Fluids/Electrolytes/Nutrition: Started on soft diet.  Advance diet as tolerated offer supplements between meals. Electrolytes stable 3/6. 8.  Left wrist fracture s/p internal fixation: NWB LUE--long arm splint X 2 weeks followed by suture removal and long arm cast for  immobilization X 2 additional weeks.   9. Perforated diverticulum s/p repair with colostomy:  Family refusing education and patient unable to perform care due to right wrist fracture.   Need to  Educate/encourage patient and family on care.  10. Purulent peritonitis: Zosyn changed to Augmentin X 7  days. CCS does not feel repeat CT abdomen/pelvis needed.  11. New onset A fib with RVR: ON amiodarone, metoprolol and Cardizem (added on 03/4).   Appreciate cardiology following; on 3/6 amiodarone was decreased to 100mg  daily and K+ supplement was started.              Monitor with increased activity. Well controlled.  12. Polymyalgia rheumatica: On prednisone--on vitamin A to mitigate steroid effect on wound healing.  13. Urinary retention: Monitor for retention.  PVRs ordered. 14. Hypothyroid: Continue Synthroid.   14. Anemia: Hgb decreased from 11 to 9.9 3/6 to 3/7. Continue to monitor. She is getting daily CBCs.      LOS:  2 days A FACE TO FACE EVALUATION WAS PERFORMED  Sundiata Ferrick P Kaijah Abts 01/16/2020, 1:10 PM

## 2020-01-17 ENCOUNTER — Inpatient Hospital Stay (HOSPITAL_COMMUNITY): Payer: Medicare PPO

## 2020-01-17 ENCOUNTER — Inpatient Hospital Stay (HOSPITAL_COMMUNITY): Payer: Medicare PPO | Admitting: Occupational Therapy

## 2020-01-17 ENCOUNTER — Inpatient Hospital Stay (HOSPITAL_COMMUNITY): Payer: Medicare PPO | Admitting: Speech Pathology

## 2020-01-17 ENCOUNTER — Inpatient Hospital Stay (HOSPITAL_COMMUNITY): Payer: Medicare PPO | Admitting: Physical Therapy

## 2020-01-17 DIAGNOSIS — T1490XA Injury, unspecified, initial encounter: Secondary | ICD-10-CM

## 2020-01-17 DIAGNOSIS — S52502S Unspecified fracture of the lower end of left radius, sequela: Secondary | ICD-10-CM

## 2020-01-17 DIAGNOSIS — S5292XA Unspecified fracture of left forearm, initial encounter for closed fracture: Secondary | ICD-10-CM

## 2020-01-17 DIAGNOSIS — I4891 Unspecified atrial fibrillation: Secondary | ICD-10-CM

## 2020-01-17 DIAGNOSIS — K659 Peritonitis, unspecified: Secondary | ICD-10-CM

## 2020-01-17 DIAGNOSIS — D649 Anemia, unspecified: Secondary | ICD-10-CM

## 2020-01-17 DIAGNOSIS — D72829 Elevated white blood cell count, unspecified: Secondary | ICD-10-CM

## 2020-01-17 DIAGNOSIS — I10 Essential (primary) hypertension: Secondary | ICD-10-CM

## 2020-01-17 DIAGNOSIS — D72828 Other elevated white blood cell count: Secondary | ICD-10-CM

## 2020-01-17 LAB — CBC
HCT: 32.4 % — ABNORMAL LOW (ref 36.0–46.0)
Hemoglobin: 10.2 g/dL — ABNORMAL LOW (ref 12.0–15.0)
MCH: 30.2 pg (ref 26.0–34.0)
MCHC: 31.5 g/dL (ref 30.0–36.0)
MCV: 95.9 fL (ref 80.0–100.0)
Platelets: 594 10*3/uL — ABNORMAL HIGH (ref 150–400)
RBC: 3.38 MIL/uL — ABNORMAL LOW (ref 3.87–5.11)
RDW: 15.7 % — ABNORMAL HIGH (ref 11.5–15.5)
WBC: 12.1 10*3/uL — ABNORMAL HIGH (ref 4.0–10.5)
nRBC: 0 % (ref 0.0–0.2)

## 2020-01-17 LAB — BASIC METABOLIC PANEL
Anion gap: 8 (ref 5–15)
BUN: 20 mg/dL (ref 8–23)
CO2: 24 mmol/L (ref 22–32)
Calcium: 8.6 mg/dL — ABNORMAL LOW (ref 8.9–10.3)
Chloride: 109 mmol/L (ref 98–111)
Creatinine, Ser: 0.85 mg/dL (ref 0.44–1.00)
GFR calc Af Amer: >60 mL/min (ref >60)
GFR calc non Af Amer: 59 mL/min — ABNORMAL LOW (ref >60)
Glucose, Bld: 109 mg/dL — ABNORMAL HIGH (ref 70–99)
Potassium: 3.6 mmol/L (ref 3.5–5.1)
Sodium: 141 mmol/L (ref 135–145)

## 2020-01-17 MED ORDER — SENNA 8.6 MG PO TABS
1.0000 | ORAL_TABLET | Freq: Every day | ORAL | Status: DC
  Administered 2020-01-17 – 2020-01-19 (×3): 8.6 mg via ORAL
  Filled 2020-01-17 (×3): qty 1

## 2020-01-17 MED ORDER — POTASSIUM CHLORIDE ER 10 MEQ PO TBCR
10.0000 meq | EXTENDED_RELEASE_TABLET | Freq: Two times a day (BID) | ORAL | Status: DC
  Administered 2020-01-17 – 2020-01-20 (×6): 10 meq via ORAL
  Filled 2020-01-17 (×13): qty 1

## 2020-01-17 NOTE — Care Management (Signed)
Patient Details  Name: [name] MRN: [id number] Date of Birth: [birth year]  Today's Date: 01/17/2020  Problem List:  Patient Active Problem List   Diagnosis Date Noted  . Left radial fracture 01/17/2020  . Leucocytosis   . Anemia   . Peritonitis ([place])   . Debility 01/14/2020  . Physical debility 01/14/2020  . Trauma   . Postoperative pain   . Urinary retention   . New onset atrial fibrillation ([place])   . Protein-calorie malnutrition, severe 01/13/2020  . Acute renal failure superimposed on stage 3a chronic kidney disease ([place]) 12/29/2019  . Closed fracture of multiple ribs 12/29/2019  . Closed fracture of left distal radius 12/29/2019  . Recurrent falls 12/29/2019  . Positive ANA (antinuclear antibody) 12/29/2019  . Osteoarthritis 11/13/2017  . Hypertriglyceridemia 01/29/2017  . Excessive gas 01/09/2017  . Preoperative cardiovascular examination 07/31/2016  . Elevated lipase 03/06/2016  . Osteopenia 02/08/2016  . Hypothyroidism 01/26/2016  . HTN (hypertension) 01/26/2016  . Left leg numbness 10/06/2012   Past Medical History:  Past Medical History:  Diagnosis Date  . DVT (deep venous thrombosis) ([place]) 09/2015   RLE  . GERD (gastroesophageal reflux disease)   . Hypertension   . Hypothyroidism   . Melanoma ([place]) 06/16/2017   Facial melanoma, removed by Dr. Harvel Quale  . Peripheral vascular disease ([place])    peripheral neuropathy   Past Surgical History:  Past Surgical History:  Procedure Laterality Date  . ABDOMINAL HYSTERECTOMY    . APPENDECTOMY    . BACK SURGERY    . BREAST SURGERY     left breast biopsy  . COLOSTOMY N/A 01/06/2020   Procedure: Colostomy;  Surgeon: Greer Pickerel, MD;  Location: [facility];  Service: General;  Laterality: N/A;  . EYE SURGERY     cataract  . HARDWARE REMOVAL Left 01/01/2020   Procedure: HARDWARE REMOVAL;  Surgeon: Iran Planas, MD;  Location: [facility];  Service: Orthopedics;  Laterality: Left;  . LAPAROTOMY N/A 01/06/2020    Procedure: Exploratory Laparotomy, Open Sigmoid colectomy;  Surgeon: Greer Pickerel, MD;  Location: [facility];  Service: General;  Laterality: N/A;  . LIPOMA EXCISION Left 04/25/2015   Procedure: EXCISION OF LIPOMA LEFT ARM;  Surgeon: Donnie Mesa, MD;  Location: [facility];  Service: General;  Laterality: Left;  . LUMBAR LAMINECTOMY/DECOMPRESSION MICRODISCECTOMY  11/20/2011   Procedure: LUMBAR LAMINECTOMY/DECOMPRESSION MICRODISCECTOMY;  Surgeon: Jeneen Rinks P [place];  [facility]: [facility];  Service: Orthopedics;  Laterality: N/A;  Decompressive Laminectomy L2 to the Sacrum (X-Ray)  . OPEN REDUCTION INTERNAL FIXATION (ORIF) DISTAL RADIAL FRACTURE Left 01/01/2020   Procedure: OPEN REDUCTION INTERNAL FIXATION (ORIF) DISTAL RADIAL FRACTURE;  Surgeon: Iran Planas, MD;  Location: [facility];  Service: Orthopedics;  Laterality: Left;  . OTHER SURGICAL HISTORY     left wrist surgery - has plate in left wrist   . OTHER SURGICAL HISTORY     right knee surgery due to torn cartilage   . TONSILLECTOMY    . WRIST FRACTURE SURGERY Left    Social History:  reports that she has never smoked. She has never used smokeless tobacco. She reports that she does not drink alcohol or use drugs.  Family / Support Systems Patient Roles: Parent Children: son and daughter next door Anticipated Caregiver: daughter Jana Half (colostomy care), sons Gershon Mussel and Lucianne Lei for mobility/other needs Ability/Limitations of Caregiver: n/a Caregiver Availability: 24/7  Social History Preferred language: English Religion: Methodist Read: Yes Write: Yes   Abuse/Neglect Abuse/Neglect Assessment Can Be Completed: Yes  Physical Abuse: Denies Verbal Abuse: Denies Sexual Abuse: Denies Exploitation of patient/patient's resources: Denies Self-Neglect: Denies  Emotional Status Pt's affect, behavior and adjustment status: Normal mood, affect and behavior  Patient / Family Perceptions, Expectations & Goals Pt/Family understanding of illness &  functional limitations: Patient has a good understanding of her health status Premorbid pt/family roles/activities: Independent PTA with ADLs, driving, etc Anticipated changes in roles/activities/participation: Will need assistance with B+D, toileting due to left wrist restrictions and new ostomy Pt/family expectations/goals: Be able to discharge home with hired help and family checking in on her after work  Occupational psychologist available at discharge: Daughter and son to provide transportation at discharge  Discharge Planning Living Arrangements: [place]: Children Type of Residence: Private residence Insurance Resources: Medicare Money Management: Patient Does the patient have any problems obtaining your medications?: No Home Management: Family will manage the home Patient/Family Preliminary Plans: Discharge to her home with hired help and family checking in prn [place] Barriers to Discharge: [place], Other (comments), Decreased caregiver support, Weight bearing restrictions Sw Barriers to Discharge Comments: Colostomy management; patient unable to complete due to right UE fx Social Work Anticipated Follow Up Needs: [facility] Additional Notes/Comments: Family education on colostomy management; 24 hour care and min assist for toileting and bathing Expected length of stay: 7-10 days  Clinical Impression Pleasant patient, very anxious to go home. Reported she was truly surprised by the colostomy; no history of diverticulitis or any bowel issues that she was aware of before all of this. Noted she has (son taken care of) hired assistance at home to assist her when family is at work. She reports the colostomy is temporary; expect reversal within 3-6 months. Reviewed family education and [facility] nurse consult for ostomy training and will set up [facility] services to follow in addition to the hired help.  Dorien Chihuahua B 01/17/2020, 1:07 PM

## 2020-01-17 NOTE — Consult Note (Signed)
WOC Nurse Consult Note: Patient receiving care in [facility] [phone].  Consult completed remotely after review of record. Reason for Consult: "ABD wound/patient possible discharge this week". Wound type: surgical Pressure Injury POA: Yes/No/NA I am a bit confused by the consult.  There are existing orders in the record for twice daily saline moistened gauze.  However, I entered a second order for the same with a notation that this is consistent with the original order given by surgical services on 01/11/20.  Please refer to the note by Margie Billet, PA on 01/11/20 at 8:21 a.m. for details for the wound care. This type of dressing is one that any licensed nurse can provide and can teach about.   Additionally, Ostomy orders are in the record for ostomy supplies and care in the event information is needed. Thank you for the consult. [facility] nurse will not follow at this time.  Please re-consult the [facility] team if needed.  Val Riles, RN, MSN, CWOCN, CNS-BC, pager [phone]

## 2020-01-17 NOTE — Progress Notes (Signed)
Subjective:    Patient reports pain as 2 on 0-10 scale.   Patient was seen and examined with family member at the bedside.  The patient is doing well. She is laying comfortably in bed with the splint on. She feels that the pain from the left arm is well-controlled.  She has been able to eat and drink without difficulty.  She denies numbness, tingling, erythema, drainage, or nausea.   Objective: Vital signs in last 24 hours: Temp:  [97.9 F (36.6 C)-98 F (36.7 C)] 97.9 F (36.6 C) (03/08 1257) Pulse Rate:  [63-64] 64 (03/08 1257) Resp:  [16-17] 17 (03/08 1257) BP: (118-132)/(60-63) 120/63 (03/08 1257) SpO2:  [97 %-99 %] 97 % (03/08 1257)  Intake/Output from previous day: 03/07 0701 - 03/08 0700 In: 820 [P.O.:820] Out: 300 [Stool:300] Intake/Output this shift: Total I/O In: 450 [P.O.:450] Out: -   Recent Labs    01/15/20 0541 01/16/20 0524 01/17/20 0514  HGB 11.0* 9.9* 10.2*   Recent Labs    01/16/20 0524 01/17/20 0514  WBC 11.8* 12.1*  RBC 3.29* 3.38*  HCT 31.6* 32.4*  PLT 587* 594*   Recent Labs    01/15/20 0541 01/17/20 0514  NA 143 141  K 3.5 3.6  CL 104 109  CO2 26 24  BUN 10 20  CREATININE 0.93 0.85  GLUCOSE 99 109*  CALCIUM 8.8* 8.6*   No results for input(s): LABPT, INR in the last 72 hours.  Sensation intact distally Intact pulses distally Incision: dressing C/D/I  She is able to wiggle all digits without difficulty.  Incision is clean, dry and intact.  Diffuse ecchymosis throughout the left upper extremity.    Assessment/Plan:    Up with therapy  Radiographs of the left wrist were ordered and we will read them once the study is complete.  A short arm cast was applied to the left arm today. She was advised to keep this clean and dry.  She will continue with elevation of the extremity and gentle motion of the fingers.  Continue with pain management as needed.  Follow up in the office in 2 weeks for repeat radiographs of the extremity  and further treatment. They will contact our office at [phone] to schedule this appointment.  All questions and concerns were encouraged and addressed.    Brynda Peon 01/17/2020, 3:44 PM

## 2020-01-17 NOTE — Evaluation (Signed)
Speech Language Pathology Assessment and Plan  Patient Details  Name: [name] MRN: [id number] Date of Birth: 1923-07-15  SLP Diagnosis:    Rehab Potential: Kermit Balo ELOS:      Today's Date: 01/17/2020 SLP Individual Time: [id number] SLP Individual Time Calculation (min): 50 min   Problem List:  Patient Active Problem List   Diagnosis Date Noted  . Left radial fracture 01/17/2020  . Leucocytosis   . Anemia   . Peritonitis ([place])   . Debility 01/14/2020  . Physical debility 01/14/2020  . Trauma   . Postoperative pain   . Urinary retention   . New onset atrial fibrillation ([place])   . Protein-calorie malnutrition, severe 01/13/2020  . Acute renal failure superimposed on stage 3a chronic kidney disease ([place]) 12/29/2019  . Closed fracture of multiple ribs 12/29/2019  . Closed fracture of left distal radius 12/29/2019  . Recurrent falls 12/29/2019  . Positive ANA (antinuclear antibody) 12/29/2019  . Osteoarthritis 11/13/2017  . Hypertriglyceridemia 01/29/2017  . Excessive gas 01/09/2017  . Preoperative cardiovascular examination 07/31/2016  . Elevated lipase 03/06/2016  . Osteopenia 02/08/2016  . Hypothyroidism 01/26/2016  . HTN (hypertension) 01/26/2016  . Left leg numbness 10/06/2012   Past Medical History:  Past Medical History:  Diagnosis Date  . DVT (deep venous thrombosis) ([place]) 09/2015   RLE  . GERD (gastroesophageal reflux disease)   . Hypertension   . Hypothyroidism   . Melanoma ([place]) 06/16/2017   Facial melanoma, removed by Dr. Harvel Quale  . Peripheral vascular disease ([place])    peripheral neuropathy   Past Surgical History:  Past Surgical History:  Procedure Laterality Date  . ABDOMINAL HYSTERECTOMY    . APPENDECTOMY    . BACK SURGERY    . BREAST SURGERY     left breast biopsy  . COLOSTOMY N/A 01/06/2020   Procedure: Colostomy;  Surgeon: Greer Pickerel, MD;  Location: [facility];  Service: General;  Laterality: N/A;  . EYE SURGERY     cataract  . HARDWARE  REMOVAL Left 01/01/2020   Procedure: HARDWARE REMOVAL;  Surgeon: Iran Planas, MD;  Location: [facility];  Service: Orthopedics;  Laterality: Left;  . LAPAROTOMY N/A 01/06/2020   Procedure: Exploratory Laparotomy, Open Sigmoid colectomy;  Surgeon: Greer Pickerel, MD;  Location: [facility];  Service: General;  Laterality: N/A;  . LIPOMA EXCISION Left 04/25/2015   Procedure: EXCISION OF LIPOMA LEFT ARM;  Surgeon: Donnie Mesa, MD;  Location: [facility];  Service: General;  Laterality: Left;  . LUMBAR LAMINECTOMY/DECOMPRESSION MICRODISCECTOMY  11/20/2011   Procedure: LUMBAR LAMINECTOMY/DECOMPRESSION MICRODISCECTOMY;  Surgeon: Jeneen Rinks P [place];  [facility];  Service: Orthopedics;  Laterality: N/A;  Decompressive Laminectomy L2 to the Sacrum (X-Ray)  . OPEN REDUCTION INTERNAL FIXATION (ORIF) DISTAL RADIAL FRACTURE Left 01/01/2020   Procedure: OPEN REDUCTION INTERNAL FIXATION (ORIF) DISTAL RADIAL FRACTURE;  Surgeon: Iran Planas, MD;  Location: [facility];  Service: Orthopedics;  Laterality: Left;  . OTHER SURGICAL HISTORY     left wrist surgery - has plate in left wrist   . OTHER SURGICAL HISTORY     right knee surgery due to torn cartilage   . TONSILLECTOMY    . WRIST FRACTURE SURGERY Left     Assessment / Plan / Recommendation Clinical Impression [place] Mental Status (SLUMS) Examination was administered. Pt scored 23/30, indicating mild neurocognitive deficits. Points were lost on thought organization, digit reversal, and auditory attention and recall. Clock drawing was appropriate, which is sensitive to executive function deficits  including planning, organization, self monitoring/correction. No further ST intervention is recommended at this time, as pt current level of function appears to be at or very near baseline. Pt reports having assistance from her 3 children, who live close by. Please reconsult if needs arise. ST signing off at this time.    Skilled Therapeutic  Interventions          n/a  SLP Assessment  Patient does not need any further Speech Language Pathology Services    Recommendations  Patient destination: Home Follow up Recommendations: None Equipment Recommended: None recommended by SLP     Pain Pain Assessment Pain Scale: 0-10 Pain Score: 0-No pain  Prior Functioning Cognitive/Linguistic Baseline: Within functional limits Type of Home: House  Lives With: Alone Available Help at Discharge: Family;Available 24 hours/day Education: high school graduate Vocation: Retired  Programmer, systems Overall Cognitive Status: Within Functional Limits for tasks assessed Arousal/Alertness: Awake/alert Orientation Level: Oriented X4 Attention: Sustained Sustained Attention: Appears intact Memory: Appears intact Awareness: Appears intact Safety/Judgment: Appears intact Comments: 23/30 on SLUMS Examination  Comprehension Auditory Comprehension Overall Auditory Comprehension: Appears within functional limits for tasks assessed Expression Expression Primary Mode of Expression: Verbal Verbal Expression Overall Verbal Expression: Appears within functional limits for tasks assessed Written Expression Dominant Hand: Right Oral Motor Oral Motor/Sensory Function Overall Oral Motor/Sensory Function: Within functional limits Motor Speech Overall Motor Speech: Appears within functional limits for tasks assessed   The above assessment, treatment plan, treatment alternatives and goals were discussed and mutually agreed upon: by patient   Syleena Mchan B. Quentin Ore, [facility], CCC-SLP Speech Language Pathologist  Shonna Chock 01/17/2020, 12:48 PM

## 2020-01-17 NOTE — Progress Notes (Signed)
Orthopedic Tech Progress Note Patient Details:  [name] 1923-04-17 [id number]  Casting Type of Cast: Short arm cast Cast Location: LUE Cast Material: Fiberglass Cast Intervention: Application  Post Interventions Instructions Provided: Adjustment of device, Care of device     Janit Pagan 01/17/2020, 5:18 PM

## 2020-01-17 NOTE — Progress Notes (Signed)
Occupational Therapy Session Note  Patient Details  Name: [name] MRN: [id number] Date of Birth: [birth year]  Today's Date: 01/17/2020 OT Individual Time: 0945-1100 OT Individual Time Calculation (min): 75 min    Short Term Goals: Week 1:  OT Short Term Goal 1 (Week 1): Pt will complete BSC or toilet transfer with Min A and LRAD OT Short Term Goal 2 (Week 1): Pt will complete sit<stand during LB self care with Min A OT Short Term Goal 3 (Week 1): Pt will complete UB dressing with Mod A using adaptive techniques as needed  Skilled Therapeutic Interventions/Progress Updates:    Pt received in bed ready for therapy.  Pt engaged in all self care including bed mobility, ambulation with hemiwalker, toileting all with min A or less.  She was able to sit to stand from bed, toilet, wc and recliner with min A to CGA using forward wt shift technique and pushing up from chair with R arm then reaching for hemiwalker.  Excellent activity tolerance and no anxiety noted. Due to arm splint, needs A to wash R arm and pull pants over L side of hips.   Pt anxious to go home soon,  Discussed having her go home Thursday. Pt resting in recliner with all needs met.   Therapy Documentation Precautions:  Precautions Precautions: Fall Precaution Comments: ostomy, abdominal wound Restrictions Weight Bearing Restrictions: Yes LUE Weight Bearing: Non weight bearing Pain: Pain Assessment Pain Scale: 0-10 Pain Score: 0-No pain ADL: ADL Eating: Set up Grooming: Setup Where Assessed-Grooming: Sitting at sink Upper Body Bathing: Minimal assistance Where Assessed-Upper Body Bathing: Sitting at sink Lower Body Bathing: Minimal assistance Where Assessed-Lower Body Bathing: Standing at sink, Sitting at sink Upper Body Dressing: Minimal assistance Where Assessed-Upper Body Dressing: Sitting at sink Lower Body Dressing: Minimal assistance Where Assessed-Lower Body Dressing: Chair Toileting: Minimal  assistance Where Assessed-Toileting: Glass blower/designer: Therapist, music Method: Counselling psychologist: Raised toilet seat Tub/Shower Transfer: Not assessed   Therapy/Group: Individual Therapy  Keaden Gunnoe 01/17/2020, 12:28 PM

## 2020-01-17 NOTE — Discharge Instructions (Addendum)
Inpatient Rehab Discharge Instructions  [name] Discharge date and time:  01/20/20  Activities/Precautions/ Functional Status: Activity: no lifting, driving, or strenuous exercise till cleared by MD Diet: cardiac diet Wound Care: 1. Pack midline incision with damp to dry dressing twice a day. Continue colostomy care.                          2. Keep cast dry. Wear sling when walking.    Functional status:  ___ No restrictions     ___ Walk up steps independently _X__ 24/7 supervision/assistance   ___ Walk up steps with assistance ___ Intermittent supervision/assistance  ___ Bathe/dress independently ___ Walk with walker     ___ Bathe/dress with assistance ___ Walk Independently    ___ Shower independently _X__ Walk with assistance    _X__ Shower with assistance _X__ No alcohol     ___ Return to work/school ________  COMMUNITY REFERRALS UPON DISCHARGE:  Home Health: PT, OT, RN  Agency:[facility] Phone:[phone]  Medical Equipment/Items Ordered:Hemi walker, 3 n 1, w/c, hospital bed  Agency/Supplier: [facility]  Special Instructions: 1. No weight on right arm.    My questions have been answered and I understand these instructions. I will adhere to these goals and the provided educational materials after my discharge from the hospital.  Patient/Caregiver Signature _______________________________ Date __________  Clinician Signature _______________________________________ Date __________  Please bring this form and your medication list with you to all your follow-up doctor's appointments.    Information on my medicine - ELIQUIS (apixaban)  This medication education was reviewed with me or my healthcare representative as part of my discharge preparation.    Why was Eliquis prescribed for you? Eliquis was prescribed for you to reduce the risk of a blood clot forming that can cause a stroke if you have a medical condition called atrial  fibrillation (a type of irregular heartbeat).  What do You need to know about Eliquis ? Take your Eliquis TWICE DAILY - one tablet in the morning and one tablet in the evening with or without food. If you have difficulty swallowing the tablet whole please discuss with your pharmacist how to take the medication safely.  Take Eliquis exactly as prescribed by your doctor and DO NOT stop taking Eliquis without talking to the doctor who prescribed the medication.  Stopping may increase your risk of developing a stroke.  Refill your prescription before you run out.  After discharge, you should have regular check-up appointments with your healthcare provider that is prescribing your Eliquis.  In the future your dose may need to be changed if your kidney function or weight changes by a significant amount or as you get older.  What do you do if you miss a dose? If you miss a dose, take it as soon as you remember on the same day and resume taking twice daily.  Do not take more than one dose of ELIQUIS at the same time to make up a missed dose.  Important Safety Information A possible side effect of Eliquis is bleeding. You should call your healthcare provider right away if you experience any of the following: ? Bleeding from an injury or your nose that does not stop. ? Unusual colored urine (red or dark brown) or unusual colored stools (red or black). ? Unusual bruising for unknown reasons. ? A serious fall or if you hit your head (even if there is no bleeding).  Some medicines  may interact with Eliquis and might increase your risk of bleeding or clotting while on Eliquis. To help avoid this, consult your healthcare provider or pharmacist prior to using any new prescription or non-prescription medications, including herbals, vitamins, non-steroidal anti-inflammatory drugs (NSAIDs) and supplements.  This website has more information on Eliquis (apixaban): http://www.eliquis.com/eliquis/home

## 2020-01-17 NOTE — Progress Notes (Addendum)
[place] PHYSICAL MEDICINE & REHABILITATION PROGRESS NOTE   Subjective/Complaints: Patient seen sitting up in bed this morning.  She states she is E overnight.  She states she will be weakened.  She has questions regarding the changing of her cast.  ROS: Denies CP, SOB, N/V/D  Objective:   No results found. Recent Labs    01/16/20 0524 01/17/20 0514  WBC 11.8* 12.1*  HGB 9.9* 10.2*  HCT 31.6* 32.4*  PLT 587* 594*   Recent Labs    01/15/20 0541 01/17/20 0514  NA 143 141  K 3.5 3.6  CL 104 109  CO2 26 24  GLUCOSE 99 109*  BUN 10 20  CREATININE 0.93 0.85  CALCIUM 8.8* 8.6*    Intake/Output Summary (Last 24 hours) at 01/17/2020 1232 Last data filed at 01/17/2020 0820 Gross per 24 hour  Intake 520 ml  Output 200 ml  Net 320 ml     Physical Exam: Vital Signs Blood pressure 132/63, pulse 64, temperature 98 F (36.7 C), temperature source Oral, resp. rate 16, height 5\' 6"  (1.676 m), weight 69.4 kg, SpO2 97 %. Constitutional: No distress . Vital signs reviewed. HENT: Normocephalic.  Atraumatic. Eyes: EOMI. No discharge. Cardiovascular: No JVD. Respiratory: Normal effort.  No stridor. GI: Non-distended.  + Ostomy. Skin: Left upper extremity dressing C/D/I Psych: Normal mood.  Normal behavior. Musc: Left upper extremity with edema and tenderness. Neurological: Alert Motor: RUE: 4+/5 proximal distal LUE: Proximal limited by cast, hand grip 4/5 Bilateral lower extremities: 4+/5 proximal to distal  Assessment/Plan: 1. Functional deficits secondary to multi-- Ortho. which require 3+ hours per day of interdisciplinary therapy in a comprehensive inpatient rehab setting.  Physiatrist is providing close team supervision and 24 hour management of active medical problems listed below.  Physiatrist and rehab team continue to assess barriers to discharge/monitor patient progress toward functional and medical goals  Care Tool:  Bathing    Body parts bathed by patient:  Chest, Front perineal area, Buttocks, Right upper leg, Left upper leg, Face, Left arm, Abdomen, Right lower leg, Left lower leg   Body parts bathed by helper: Right arm Body parts n/a: Left arm, Abdomen   Bathing assist Assist Level: Minimal Assistance - Patient > 75%     Upper Body Dressing/Undressing Upper body dressing   What is the patient wearing?: Button up shirt    Upper body assist Assist Level: Minimal Assistance - Patient > 75%    Lower Body Dressing/Undressing Lower body dressing      What is the patient wearing?: Pants, Underwear/pull up     Lower body assist Assist for lower body dressing: Minimal Assistance - Patient > 75%     Toileting Toileting    Toileting assist Assist for toileting: Minimal Assistance - Patient > 75%     Transfers Chair/bed transfer  Transfers assist     Chair/bed transfer assist level: Minimal Assistance - Patient > 75%     Locomotion Ambulation   Ambulation assist      Assist level: Minimal Assistance - Patient > 75% Assistive device: Walker-hemi Max distance: 10   Walk 10 feet activity   Assist     Assist level: Minimal Assistance - Patient > 75% Assistive device: Walker-hemi   Walk 50 feet activity   Assist Walk 50 feet with 2 turns activity did not occur: Safety/medical concerns         Walk 150 feet activity   Assist Walk 150 feet activity did not occur: Safety/medical concerns  Walk 10 feet on uneven surface  activity   Assist Walk 10 feet on uneven surfaces activity did not occur: Safety/medical concerns         Wheelchair     Assist Will patient use wheelchair at discharge?: Yes Type of Wheelchair: Manual    Wheelchair assist level: Minimal Assistance - Patient > 75% Max wheelchair distance: 150    Wheelchair 50 feet with 2 turns activity    Assist        Assist Level: Minimal Assistance - Patient > 75%   Wheelchair 150 feet activity     Assist       Assist Level: Minimal Assistance - Patient > 75%   Blood pressure 132/63, pulse 64, temperature 98 F (36.7 C), temperature source Oral, resp. rate 16, height 5\' 6"  (1.676 m), weight 69.4 kg, SpO2 97 %.  Medical Problem List and Plan: 1.  Deficits with mobility, transfers, self-care secondary to multi-- Ortho.  Continue CIR   Patient states she was to receive a hard cast over the weekend for her left radial fracture, will follow up with Ortho 2.  Antithrombotics: -DVT/anticoagulation:  Pharmaceutical: Other (comment)--Elquis             -antiplatelet therapy: N/A 3. Pain Management: Continue Oxycodone prn             Controlled with meds on 3/8  Monitor with increased exertion.  4. Mood: LCSW to follow for evaluation and support.              -antipsychotic agents: N/a 5. Neuropsych: This patient is capable of making decisions on her own behalf. 6. Skin/Wound Care: Added vitamin C/multivitamin to promote wound healing.  7. Fluids/Electrolytes/Nutrition: Started on soft diet.  Advance diet as tolerated offer supplements between meals.   BMP within acceptable range on 3/8 8.  Left wrist fracture s/p internal fixation: NWB LUE--long arm splint X 2 weeks (~3/20) followed by suture removal and long arm cast for  immobilization X 2 additional weeks.   9. Perforated diverticulum s/p repair with colostomy:  Family refusing education and patient unable to perform care due to right wrist fracture.   Need to  Educate/encourage patient and family on care.  10. Purulent peritonitis: Zosyn changed to Augmentin X 7 days (through 3/10). CCS does not feel repeat CT abdomen/pelvis needed.  11. New onset A fib with RVR: ON amiodarone, metoprolol and Cardizem (added on 03/4).   On 3/6 amiodarone was decreased to 100mg  daily and K+ supplement was started.   Rate controlled on 3/8             Monitor with increased activity. Well controlled.  12. Polymyalgia rheumatica: On prednisone--on vitamin A to  mitigate steroid effect on wound healing.  13. Urinary retention: Monitor for retention.  PVRs unremarkable.  Improving 14. Hypothyroid: Continue Synthroid.   14. Anemia:   Hgb 10.2 on 3/8   Continue to monitor.  15.  Leukocytosis-likely related to steroids  WBCs 12.1 on 3/8  Afebrile  Continue to monitor  LOS: 3 days A FACE TO FACE EVALUATION WAS PERFORMED  Krystal Teachey Lorie Phenix 01/17/2020, 12:32 PM

## 2020-01-17 NOTE — Progress Notes (Signed)
Inpatient Rehabilitation  Patient information reviewed and entered into eRehab system by Mohsen Odenthal M. Carmell Elgin, M.A., CCC/SLP, PPS Coordinator.  Information including medical coding, functional ability and quality indicators will be reviewed and updated through discharge.    

## 2020-01-17 NOTE — Care Management (Signed)
Inpatient Rehabilitation Center Individual Statement of Services  Patient Name:  [name]  Date:  01/17/2020  Welcome to the [facility].  Our goal is to provide you with an individualized program based on your diagnosis and situation, designed to meet your specific needs.  With this comprehensive rehabilitation program, you will be expected to participate in at least 3 hours of rehabilitation therapies Monday-Friday, with modified therapy programming on the weekends.  Your rehabilitation program will include the following services:  Physical Therapy (PT), Occupational Therapy (OT), Speech Therapy (ST), 24 hour per day rehabilitation nursing, Therapeutic Recreaction (TR), Neuropsychology, Case Management (Social Worker), Rehabilitation Medicine, Nutrition Services and Pharmacy Services  Weekly team conferences will be held on Wednesdays to discuss your progress.  Your Social Industrial/product designer will talk with you frequently to get your input and to update you on team discussions.  Team conferences with you and your family in attendance may also be held.  Expected length of stay: 7 days Overall anticipated outcome: Supervision to Minimal assistance  Depending on your progress and recovery, your program may change. Your Social Industrial/product designer will coordinate services and will keep you informed of any changes. Your Social Worker's/Care Manager's name and contact numbers are listed  below.  The following services may also be recommended but are not provided by the [facility]:    [place] will be made to provide these services after discharge if needed.  Arrangements include referral to agencies that provide these services.  Your insurance has been verified to be:  [employer] Your primary doctor is:  Dr. Annye Asa  Pertinent information will be shared with  your doctor and your insurance company.  Social Worker:  Loralee Pacas, Day or ([phone] Care Manager: Jenetta [phone] or [phone]) [phone]  Information discussed with and copy given to patient by: Margarito Liner, 01/17/2020, 1:12 PM

## 2020-01-17 NOTE — Progress Notes (Signed)
Physical Therapy Session Note  Patient Details  Name: [name] MRN: [id number] Date of Birth: 1923/05/27  Today's Date: 01/17/2020 PT Individual Time: 1330-1420 PT Individual Time Calculation (min): 50 min   Short Term Goals: Week 1:  PT Short Term Goal 1 (Week 1): Pt will perform transfers with CGA and LRAD  Skilled Therapeutic Interventions/Progress Updates:    Patient received up in chair, reports she feels very fatigued this afternoon- did not sleep well last night and has not really been able to nap today due to lots of people coming in to work with her. Generally able to perform transfers with MinA, occasional ModA, and HHA or hemiwalker with min cues for safety. Transported her to gym in [facility] for energy conservation, then worked on Cytogeneticist for [employer] and gross transfer technique to and from chair. At this point she clearly began to fade and became less participatory in session, stated she felt very fatigued and really needed to go back to her room. Able to gait train approximately 35f to chair with MinA/Mod cues with hemiwalker and returned to her room. Able to toilet with MinA for transfers, and required MinA for return to bed for safety as she began to become more impulsive as fatigue increased. She was left in bed with all needs met, bed alarm active and RN attending. 10 minutes of therapy time lost due to fatigue/refusal to continue to participate.   Therapy Documentation Precautions:  Precautions Precautions: Fall Precaution Comments: ostomy, abdominal wound Restrictions Weight Bearing Restrictions: Yes LUE Weight Bearing: Non weight bearing  Pain Assessment Pain Scale: 0-10 Pain Score: 2  Pain Type: Acute pain Pain Location: Arm Pain Orientation: Left Pain Descriptors / Indicators: Aching Pain Onset: On-going Patients Stated Pain Goal: 0 Pain Intervention(s): RN made aware Multiple Pain Sites: No    Therapy/Group: Individual Therapy  KWindell Norfolk DPT, PN1   Supplemental Physical Therapist [place]   Pager [phone] Rehab Office [phone]  01/17/2020, 3:41 PM

## 2020-01-18 ENCOUNTER — Inpatient Hospital Stay (HOSPITAL_COMMUNITY): Payer: Medicare PPO | Admitting: Physical Therapy

## 2020-01-18 ENCOUNTER — Ambulatory Visit (HOSPITAL_COMMUNITY): Payer: Medicare PPO | Admitting: Physical Therapy

## 2020-01-18 ENCOUNTER — Inpatient Hospital Stay (HOSPITAL_COMMUNITY): Payer: Medicare PPO | Admitting: Occupational Therapy

## 2020-01-18 ENCOUNTER — Encounter (HOSPITAL_COMMUNITY): Payer: Medicare PPO | Admitting: Occupational Therapy

## 2020-01-18 LAB — CBC
HCT: 32.5 % — ABNORMAL LOW (ref 36.0–46.0)
Hemoglobin: 10.2 g/dL — ABNORMAL LOW (ref 12.0–15.0)
MCH: 29.7 pg (ref 26.0–34.0)
MCHC: 31.4 g/dL (ref 30.0–36.0)
MCV: 94.5 fL (ref 80.0–100.0)
Platelets: 551 10*3/uL — ABNORMAL HIGH (ref 150–400)
RBC: 3.44 MIL/uL — ABNORMAL LOW (ref 3.87–5.11)
RDW: 15.9 % — ABNORMAL HIGH (ref 11.5–15.5)
WBC: 12 10*3/uL — ABNORMAL HIGH (ref 4.0–10.5)
nRBC: 0 % (ref 0.0–0.2)

## 2020-01-18 NOTE — Progress Notes (Signed)
Education on abdominal wound dressing completed with daughter Jana Half. Daughter observed dressing change, wound appearance and educated on signs/symptoms of infection. All questions answered. Daughter would benefit from hands on practice tomorrow.

## 2020-01-18 NOTE — Progress Notes (Signed)
Orthopedic Tech Progress Note Patient Details:  [name] [birth year] [id number] Had [place] apply SHOULDER SLING Ortho Devices Type of Ortho Device: Sling immobilizer Ortho Device/Splint Location: LUE Ortho Device/Splint Interventions: Ordered, Application   Post Interventions Patient Tolerated: Well Instructions Provided: Care of device, Adjustment of device   Janit Pagan 01/18/2020, 10:31 AM

## 2020-01-18 NOTE — Progress Notes (Signed)
[place] PHYSICAL MEDICINE & REHABILITATION PROGRESS NOTE   Subjective/Complaints: Patient seen sitting up in bed this morning.  She states she slept well overnight.  She was seen by Ortho yesterday and short arm cast applied (notes reviewed), she states her hard cast feels better.  ROS: Denies CP, SOB, N/V/D  Objective:   DG Wrist 2 Views Left  Result Date: 01/17/2020 CLINICAL DATA:  Fracture EXAM: LEFT WRIST - 2 VIEW COMPARISON:  12/29/2019 FINDINGS: Interval casting material obscures bone detail. Longer fixating plate and multiple screws now evident within the radius across acute proximal shaft fracture with anatomic alignment. Old fracture distal radius and ulna. IMPRESSION: Surgically fixated distal radius fracture with anatomic alignment. Electronically Signed   By: Donavan Foil M.D.   On: 01/17/2020 17:12   Recent Labs    01/17/20 0514 01/18/20 0532  WBC 12.1* 12.0*  HGB 10.2* 10.2*  HCT 32.4* 32.5*  PLT 594* 551*   Recent Labs    01/17/20 0514  NA 141  K 3.6  CL 109  CO2 24  GLUCOSE 109*  BUN 20  CREATININE 0.85  CALCIUM 8.6*    Intake/Output Summary (Last 24 hours) at 01/18/2020 [id number] Last data filed at 01/18/2020 [id number] Gross per 24 hour  Intake 480 ml  Output 300 ml  Net 180 ml     Physical Exam: Vital Signs Blood pressure (!) 148/72, pulse 76, temperature 98.2 F (36.8 C), temperature source Oral, resp. rate 16, height 5\' 6"  (1.676 m), weight 69.4 kg, SpO2 97 %. Constitutional: No distress . Vital signs reviewed. HENT: Normocephalic.  Atraumatic. Eyes: EOMI. No discharge. Cardiovascular: No JVD. Respiratory: Normal effort.  No stridor. GI: Non-distended.  + Ostomy. Skin: Left upper extremity with hard cast with proximal ecchymosis Psych: Normal mood.  Normal behavior. Musc: Left upper extremity with edema and tenderness Neurological: Alert HOH Motor: RUE: 4+/5 proximal distal LUE: Shoulder abduction 2+/5, elbow flexion/extension 3 -/5, proximal  limited by cast, hand grip 4/5 Bilateral lower extremities: 4+/5 proximal to distal, improving  Assessment/Plan: 1. Functional deficits secondary to multi-- Ortho. which require 3+ hours per day of interdisciplinary therapy in a comprehensive inpatient rehab setting.  Physiatrist is providing close team supervision and 24 hour management of active medical problems listed below.  Physiatrist and rehab team continue to assess barriers to discharge/monitor patient progress toward functional and medical goals  Care Tool:  Bathing    Body parts bathed by patient: Chest, Front perineal area, Buttocks, Right upper leg, Left upper leg, Face, Left arm, Abdomen, Right lower leg, Left lower leg   Body parts bathed by helper: Right arm Body parts n/a: Left arm, Abdomen   Bathing assist Assist Level: Minimal Assistance - Patient > 75%     Upper Body Dressing/Undressing Upper body dressing   What is the patient wearing?: Button up shirt    Upper body assist Assist Level: Minimal Assistance - Patient > 75%    Lower Body Dressing/Undressing Lower body dressing      What is the patient wearing?: Pants, Underwear/pull up     Lower body assist Assist for lower body dressing: Minimal Assistance - Patient > 75%     Toileting Toileting    Toileting assist Assist for toileting: Minimal Assistance - Patient > 75%     Transfers Chair/bed transfer  Transfers assist     Chair/bed transfer assist level: Minimal Assistance - Patient > 75%     Locomotion Ambulation   Ambulation assist  Assist level: Minimal Assistance - Patient > 75% Assistive device: Walker-hemi Max distance: 15   Walk 10 feet activity   Assist     Assist level: Minimal Assistance - Patient > 75% Assistive device: Walker-hemi   Walk 50 feet activity   Assist Walk 50 feet with 2 turns activity did not occur: Safety/medical concerns         Walk 150 feet activity   Assist Walk 150 feet  activity did not occur: Safety/medical concerns         Walk 10 feet on uneven surface  activity   Assist Walk 10 feet on uneven surfaces activity did not occur: Safety/medical concerns         Wheelchair     Assist Will patient use wheelchair at discharge?: Yes Type of Wheelchair: Manual    Wheelchair assist level: Minimal Assistance - Patient > 75% Max wheelchair distance: 150    Wheelchair 50 feet with 2 turns activity    Assist        Assist Level: Minimal Assistance - Patient > 75%   Wheelchair 150 feet activity     Assist      Assist Level: Minimal Assistance - Patient > 75%   Blood pressure (!) 148/72, pulse 76, temperature 98.2 F (36.8 C), temperature source Oral, resp. rate 16, height 5\' 6"  (1.676 m), weight 69.4 kg, SpO2 97 %.  Medical Problem List and Plan: 1.  Deficits with mobility, transfers, self-care secondary to multi-- Ortho.  Continue CIR  2.  Antithrombotics: -DVT/anticoagulation:  Pharmaceutical: Other (comment)--Elquis             -antiplatelet therapy: N/A 3. Pain Management: Continue Oxycodone prn             Controlled with meds on 3/9  Monitor with increased exertion.  4. Mood: LCSW to follow for evaluation and support.              -antipsychotic agents: N/a 5. Neuropsych: This patient is capable of making decisions on her own behalf. 6. Skin/Wound Care: Added vitamin C/multivitamin to promote wound healing.  7. Fluids/Electrolytes/Nutrition: Started on soft diet.  Advance diet as tolerated offer supplements between meals.   BMP within acceptable range on 3/8 8.  Left wrist fracture s/p internal fixation: NWB LUE-changed to short arm cast on 3/8 per Ortho 9. Perforated diverticulum s/p repair with colostomy:  Family refusing education and patient unable to perform care due to right wrist fracture.   Need to  Educate/encourage patient and family on care.  10. Purulent peritonitis: Zosyn changed to Augmentin X 7 days  (through 3/10). CCS does not feel repeat CT abdomen/pelvis needed.  11. New onset A fib with RVR: ON amiodarone, metoprolol and Cardizem (added on 03/4).   On 3/6 amiodarone was decreased to 100mg  daily and K+ supplement was started.   Rate controlled on 3/9             Monitor with increased activity. Well controlled.  12. Polymyalgia rheumatica: On prednisone--on vitamin A to mitigate steroid effect on wound healing.  13. Urinary retention: Monitor for retention.  PVRs unremarkable.  Improving 14. Hypothyroid: Continue Synthroid.   14. Anemia:   Hgb 10.2 on 3/9  Continue to monitor.  15.  Leukocytosis-likely related to steroids  WBCs 12.0 on 3/9  Afebrile  Continue to monitor  LOS: 4 days A FACE TO FACE EVALUATION WAS PERFORMED  Dhruti Ghuman Lorie Phenix 01/18/2020, 9:21 AM

## 2020-01-18 NOTE — Progress Notes (Signed)
Occupational Therapy Session Note  Patient Details  Name: NALEE LIGHTLE MRN: [id number] Date of Birth: [birth year]  Today's Date: 01/18/2020 OT Individual Time: 2637-8588 OT Individual Time Calculation (min): 60 min    Short Term Goals: Week 1:  OT Short Term Goal 1 (Week 1): Pt will complete BSC or toilet transfer with Min A and LRAD OT Short Term Goal 2 (Week 1): Pt will complete sit<stand during LB self care with Min A OT Short Term Goal 3 (Week 1): Pt will complete UB dressing with Mod A using adaptive techniques as needed  Skilled Therapeutic Interventions/Progress Updates:    Pt received in bed ready for therapy. She did not have clean underwear or pants to change into so pt opted to not change LB and only do UB bathing and dressing. Sat to EOB,stood up and used hemiwalker to step to arm chair at sink with CGA. Encouraged pt to do oral care in standing but her back was hurting so she wanted to sit.  From sitting, competed oral care set up, bathing min to wash right shoulder and verbal cues for donning shirt with L arm cast. Pt stepped to recliner.  From sitting worked on L shoulder AROM with scapular glides of shoulder circles and holding L arm in R and rocking arm back and forth. Worked on standing balance for 2 minutes but she become light headed due to increased back pain. Pt felt she had done enough this morning.  Provided pt with a hot pack. Pt does need cues at times due to either delayed processing or decreased memory but she also seems to be anxious and this is impacting her attention. Pt resting in recliner with all needs met. Chair alarm on.   Therapy Documentation Precautions:  Precautions Precautions: Fall Precaution Comments: ostomy, abdominal wound Restrictions Weight Bearing Restrictions: Yes LUE Weight Bearing: Non weight bearing  Pain: Pain Assessment Pain Scale: 0-10 Pain Score: 0-No pain ADL: ADL Eating: Set up Grooming: Setup Where Assessed-Grooming:  Sitting at sink Upper Body Bathing: Minimal assistance Where Assessed-Upper Body Bathing: Sitting at sink Lower Body Bathing: Minimal assistance Where Assessed-Lower Body Bathing: Standing at sink, Sitting at sink Upper Body Dressing: Minimal assistance Where Assessed-Upper Body Dressing: Sitting at sink Lower Body Dressing: Minimal assistance Where Assessed-Lower Body Dressing: Chair Toileting: Minimal assistance Where Assessed-Toileting: Glass blower/designer: Therapist, music Method: Counselling psychologist: Raised toilet seat Tub/Shower Transfer: Not assessed   Therapy/Group: Individual Therapy  [place] 01/18/2020, 8:59 AM

## 2020-01-18 NOTE — Progress Notes (Signed)
Physical Therapy Session Note  Patient Details  Name: [name] MRN: [id number] Date of Birth: 09-17-1923  Today's Date: 01/18/2020 PT Missed Time: 69 Minutes Missed Time Reason: Patient fatigue     Upon therapist arrival to room [place], RN exiting having just completed family education. Pt received sitting in recliner and upon inquiring to participate in therapy session she deferred, despite encouragement, reporting she was fatigued from earlier PT sessions. Therapist confirmed pt/family had no questions regarding earlier hands-on training and educated pt/family on possible need for w/c at discharge and to discuss this further with Cyril Mourning, PT tomorrow morning. Missed 30 minutes of skilled physical therapy.   Tawana Scale, PT, DPT 01/18/2020, 3:45 PM

## 2020-01-18 NOTE — Progress Notes (Signed)
Occupational Therapy Session Note  Patient Details  Name: [name] MRN: [id number] Date of Birth: June 24, 1923  Today's Date: 01/18/2020 OT Individual Time: 1415-1444 OT Individual Time Calculation (min): 29 min    Short Term Goals: Week 1:  OT Short Term Goal 1 (Week 1): Pt will complete BSC or toilet transfer with Min A and LRAD OT Short Term Goal 2 (Week 1): Pt will complete sit<stand during LB self care with Min A OT Short Term Goal 3 (Week 1): Pt will complete UB dressing with Mod A using adaptive techniques as needed  Skilled Therapeutic Interventions/Progress Updates:    Upon entering the room, pt seated in recliner chair with 3 family members present for family education. OT educated caregivers on OT goals, pt's current level of progress, and expectations for home. Family members with several questions regarding equipment needs and HHOT recommendation. OT answered all questions to their satisfaction. OT educated family no shower at discharge secondary to abdominal wound and they verbalized understanding. Caregivers declined to practice transfer onto toilet with hemiwalker as they felt they would be able to manage and daughter reports having assisted her with toileting needs before this session. Pt remains seated in recliner chair with call bell and all needed items within reach. Chair alarm activated.   Therapy Documentation Precautions:  Precautions Precautions: Fall Precaution Comments: ostomy, abdominal wound Restrictions Weight Bearing Restrictions: Yes LUE Weight Bearing: Non weight bearing General:   Vital Signs: Therapy Vitals Temp: 98.2 F (36.8 C) Pulse Rate: (!) 56 Resp: 18 BP: 110/63 Patient Position (if appropriate): Lying Oxygen Therapy SpO2: 97 % O2 Device: Room Air Pain: Pain Assessment Pain Scale: 0-10 Pain Score: 0-No pain Faces Pain Scale: No hurt Pain Type: Chronic pain Pain Location: Back Pain Descriptors / Indicators: Aching Pain Onset:  Gradual Pain Intervention(s): Medication (See eMAR)(with tylenol per pt prefereence) ADL: ADL Eating: Set up Grooming: Setup Where Assessed-Grooming: Sitting at sink Upper Body Bathing: Minimal assistance Where Assessed-Upper Body Bathing: Sitting at sink Lower Body Bathing: Minimal assistance Where Assessed-Lower Body Bathing: Standing at sink, Sitting at sink Upper Body Dressing: Minimal assistance Where Assessed-Upper Body Dressing: Sitting at sink Lower Body Dressing: Minimal assistance Where Assessed-Lower Body Dressing: Chair Toileting: Minimal assistance Where Assessed-Toileting: Glass blower/designer: Therapist, music Method: Counselling psychologist: Raised toilet seat Tub/Shower Transfer: Not assessed   Therapy/Group: Individual Therapy  Gypsy Decant 01/18/2020, 4:03 PM

## 2020-01-18 NOTE — Progress Notes (Signed)
Physical Therapy Session Note  Patient Details  Name: [name] MRN: [id number] Date of Birth: Dec 08, 1922  Today's Date: 01/18/2020 PT Individual Time: 1497-0263 PT Individual Time Calculation (min): 72 min   Short Term Goals: Week 1:  PT Short Term Goal 1 (Week 1): Pt will perform transfers with CGA and LRAD  Skilled Therapeutic Interventions/Progress Updates:    Patient received up in recliner, feeling better today and willing to participate in PT, now has sling for ambulation/comfort. Able to gait train into bathroom but did need MinA for safe toilet transfers/bathroom mobility due to poor balance and NWB L UE,  then headed to the gym in [facility] for energy conservation. Focused much of session on gait training today, gait trained multiple bouts of 20-9f with R hemiwalker and cues for gait pattern, correct use of hemiwalker, and general sequencing- gait pattern much improved by end of session. Also spent time working on stair navigation- quite fatigued at this point of the session and required MinA as well as max cues for safe step technique with hemiwalker, MinA for balance. Needed multiple extended rest breaks throughout session due to fatigue. Performed quite a bit of education concerning energy conservation techniques and encouraged frequent rest breaks at home as needed as she does tend to get more impulsive and unsafe as fatigue increases. She was left up in the chair with all needs met, alarm active.   Therapy Documentation Precautions:  Precautions Precautions: Fall Precaution Comments: ostomy, abdominal wound Restrictions Weight Bearing Restrictions: Yes LUE Weight Bearing: Non weight bearing   Pain: Pain Assessment Pain Scale: 0-10 Pain Score: 0-No pain Faces Pain Scale: No hurt Pain Type: Chronic pain Pain Location: Back Pain Descriptors / Indicators: Aching Pain Onset: Gradual Pain Intervention(s): Medication (See eMAR)(with tylenol per pt  prefereence)    Therapy/Group: Individual Therapy  KWindell Norfolk DPT, PN1   Supplemental Physical Therapist [place]   Pager [phone] Rehab Office [phone]  01/18/2020, 12:41 PM

## 2020-01-18 NOTE — Consult Note (Signed)
[facility] Nurse ostomy follow up Patient receiving care in [facility] 4W26. Stoma type/location: LLQ colostomy Stomal assessment/size: 1 3/8 inches round, budded, pink, moist Peristomal assessment: deferred.  Pouch change to occur with daughter, Jana Half, tomorrow Treatment options for stomal/peristomal skin: barrier ring Output soft brown Ostomy pouching: 1pc. flat Education provided:   Both sons and daughter, Jana Half, along with patient underwent an entire teaching session.  All were able to open/close demo pouch.  They observed demo of sizing the stoma, cutting a new pouch opening, experienced the stretching of a barrier ring.  We discussed cleaning around the stoma with water only, avoiding the use of creams, soaps, and baby wipes.  We discussed changing the pouch twice weekly and with any leakage, and how often to empty the pouch.  The plan is to go through an entire pouch change process tomorrow at 1330 with Jana Half.  Supplies are in the room.  We may need to send in another Secure Start Enrollment form as the children tell me they don't think the box of supplies have been ordered.  Enrolled patient in [place] Discharge program: Yes, previously. Val Riles, RN, MSN, CWOCN, CNS-BC, pager [phone]

## 2020-01-18 NOTE — Progress Notes (Signed)
Physical Therapy Session Note  Patient Details  Name: [name] MRN: [id number] Date of Birth: 1923-07-13  Today's Date: 01/18/2020 PT Individual Time: 1335-1405 PT Individual Time Calculation (min): 30 min   Short Term Goals: Week 1:  PT Short Term Goal 1 (Week 1): Pt will perform transfers with CGA and LRAD  Skilled Therapeutic Interventions/Progress Updates:    Patient received up in chair with 3 family members present for education today. Discussed correct use of hemiwalker, technique and hand placement for sit to stand/stand to sit, and technique for gait with RW; also demonstrated and discussed stair training with family present today. Family does clarify that to get into the home she has 2 steps with railing, not a single step with no railing as patient had described. Family able to provide appropriate guarding technique for transfers, gait, and stairs. Also demonstrated car transfer due to family anxiety over this transfer. She was left up in the recliner with all needs met, chair alarm active and family present awaiting education from other staff.   Therapy Documentation Precautions:  Precautions Precautions: Fall Precaution Comments: ostomy, abdominal wound Restrictions Weight Bearing Restrictions: Yes LUE Weight Bearing: Non weight bearing Pain: Pain Assessment Pain Scale: 0-10 Pain Score: 0-No pain Faces Pain Scale: No hurt Pain Type: Chronic pain Pain Location: Back Pain Descriptors / Indicators: Aching Pain Onset: Gradual Pain Intervention(s): Medication (See eMAR)(with tylenol per pt prefereence)     Therapy/Group: Individual Therapy   Windell Norfolk, DPT, PN1   Supplemental Physical Therapist [place]    Pager [phone] Acute Rehab Office [phone]    01/18/2020, 3:38 PM

## 2020-01-18 NOTE — Plan of Care (Signed)
  Problem: Consults Goal: RH GENERAL PATIENT EDUCATION Description: See Patient Education module for education specifics. Outcome: Progressing Goal: Skin Care Protocol Initiated - if Braden Score 18 or less Description: If consults are not indicated, leave blank or document N/A Outcome: Progressing   Problem: RH BOWEL ELIMINATION Goal: RH STG MANAGE BOWEL WITH ASSISTANCE Description: STG Manage Bowel with mod Assistance.  NEW OSTOMY Outcome: Progressing   Problem: RH BLADDER ELIMINATION Goal: RH STG MANAGE BLADDER WITH ASSISTANCE Description: STG Manage Bladder With min/mod Assistance Outcome: Progressing   Problem: RH SKIN INTEGRITY Goal: RH STG SKIN FREE OF INFECTION/BREAKDOWN Description: Patients skin will remain free from further infection or breakdown with min assist. Outcome: Progressing Goal: RH STG MAINTAIN SKIN INTEGRITY WITH ASSISTANCE Description: STG Maintain Skin Integrity With min Assistance. Outcome: Progressing Goal: RH STG ABLE TO PERFORM INCISION/WOUND CARE W/ASSISTANCE Description: STG Able To Perform Incision/Wound Care With mod Assistance.  OSTOMY CARE Outcome: Progressing   Problem: RH SAFETY Goal: RH STG ADHERE TO SAFETY PRECAUTIONS W/ASSISTANCE/DEVICE Description: STG Adhere to Safety Precautions With supervision Assistance/Device. Outcome: Progressing   Problem: RH PAIN MANAGEMENT Goal: RH STG PAIN MANAGED AT OR BELOW PT'S PAIN GOAL Description: < 3 Outcome: Progressing   Problem: RH KNOWLEDGE DEFICIT GENERAL Goal: RH STG INCREASE KNOWLEDGE OF SELF CARE AFTER HOSPITALIZATION Description: Patient/caregiver will verbalize and demonstrate proper care for new ostomy with mod assist. Outcome: Progressing

## 2020-01-19 ENCOUNTER — Inpatient Hospital Stay (HOSPITAL_COMMUNITY): Payer: Medicare PPO | Admitting: Occupational Therapy

## 2020-01-19 ENCOUNTER — Inpatient Hospital Stay (HOSPITAL_COMMUNITY): Payer: Medicare PPO | Admitting: Physical Therapy

## 2020-01-19 ENCOUNTER — Inpatient Hospital Stay (HOSPITAL_COMMUNITY): Payer: Medicare PPO

## 2020-01-19 DIAGNOSIS — S52502A Unspecified fracture of the lower end of left radius, initial encounter for closed fracture: Secondary | ICD-10-CM

## 2020-01-19 LAB — CBC
HCT: 32.7 % — ABNORMAL LOW (ref 36.0–46.0)
Hemoglobin: 10.1 g/dL — ABNORMAL LOW (ref 12.0–15.0)
MCH: 29.4 pg (ref 26.0–34.0)
MCHC: 30.9 g/dL (ref 30.0–36.0)
MCV: 95.3 fL (ref 80.0–100.0)
Platelets: 571 10*3/uL — ABNORMAL HIGH (ref 150–400)
RBC: 3.43 MIL/uL — ABNORMAL LOW (ref 3.87–5.11)
RDW: 16.1 % — ABNORMAL HIGH (ref 11.5–15.5)
WBC: 13.3 10*3/uL — ABNORMAL HIGH (ref 4.0–10.5)
nRBC: 0 % (ref 0.0–0.2)

## 2020-01-19 MED ORDER — DOCUSATE SODIUM 100 MG PO CAPS
100.0000 mg | ORAL_CAPSULE | Freq: Two times a day (BID) | ORAL | Status: DC
  Administered 2020-01-19 – 2020-01-20 (×2): 100 mg via ORAL
  Filled 2020-01-19 (×2): qty 1

## 2020-01-19 MED ORDER — ASCORBIC ACID 250 MG PO TABS: 250.0000 mg | ORAL_TABLET | Freq: Two times a day (BID) | ORAL | Status: DC

## 2020-01-19 MED ORDER — ACETAMINOPHEN 325 MG PO TABS: 325.0000 mg | ORAL_TABLET | ORAL | Status: DC | PRN

## 2020-01-19 NOTE — Progress Notes (Signed)
Physical Therapy Session Note  Patient Details  Name: [name] MRN: [id number] Date of Birth: 12-Jun-1923  Today's Date: 01/19/2020 PT Individual Time: [id number] PT Individual Time Calculation (min): 40 min    Short Term Goals: Week 1:  PT Short Term Goal 1 (Week 1): Pt will perform transfers with CGA and LRAD  Skilled Therapeutic Interventions/Progress Updates:    Pt seated in recliner upon PT arrival, agreeable to therapy tx and denies pain. Pt transferred to standing with supervision and ambulated x 5 ft to w/c with HW and supervision. Pt transported to the gym. Pt transferred w/c<>nustep this session with HW and supervision, stand pivot. Pt used nustep this session x 5 minutes on workload 3 for global strength and endurance, using legs only. Pt transported to the gym. Pt ambulated x 30 ft this session with HW and supervision, cues for techniques and balance. Pt instructed in strengthening exercises this session including 2 x 10 of the following: sit<>stands from elevated mat, seated hip abduction with theraband, marches and LAQ. Educated pt on performing these as part of home exercise program. Pt ambulated x 20 ft back to her w/c with HW and supervision, transported back to room. Pt ambulated x 10 ft to her recliner with HW and supervision, left in recliner with needs in reach and chair alarm set.   Therapy Documentation Precautions:  Precautions Precautions: Fall Precaution Comments: ostomy, abdominal wound Restrictions Weight Bearing Restrictions: Yes LUE Weight Bearing: Non weight bearing    Therapy/Group: Individual Therapy  Netta Corrigan, PT, DPT, CSRS 01/19/2020, 7:54 AM

## 2020-01-19 NOTE — Progress Notes (Signed)
Physical Therapy Discharge Summary  Patient Details  Name: [name] MRN: [id number] Date of Birth: 1923-06-16  Today's Date: 01/19/2020    Patient has met 8 of 9  long term goals due to improved activity tolerance, improved balance, increased strength and improved coordination.  Patient to discharge at ambulatory distance up to 45f, otherwise mobilizing via WC level Supervision.   Patient's care partner is independent to provide the necessary physical assistance at discharge.  Reasons goals not met: Still needs MinA to get up from low car- otherwise has met goals for all activities   Recommendation:  Patient will benefit from ongoing skilled PT services in home health setting to continue to advance safe functional mobility, address ongoing impairments in strength, functional activity tolerance, impaired balance, impaired gait pattern, and minimize fall risk.  Equipment: hemiwalker, WC   Reasons for discharge: treatment goals met and discharge from hospital  Patient/family agrees with progress made and goals achieved: Yes  PT Discharge Precautions/Restrictions Precautions Precautions: Fall Precaution Comments: ostomy, abdominal wound Restrictions Weight Bearing Restrictions: Yes LUE Weight Bearing: Non weight bearing Pain Pain Assessment Pain Scale: 0-10 Pain Score: 0-No pain Faces Pain Scale: No hurt Vision/Perception  Perception Perception: Within Functional Limits Praxis Praxis: Intact  Cognition Overall Cognitive Status: Within Functional Limits for tasks assessed Arousal/Alertness: Awake/alert Orientation Level: Oriented X4 Attention: Sustained Sustained Attention: Appears intact Memory: Appears intact Sensation Sensation Light Touch: Impaired Detail Peripheral sensation comments: 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, per pt this is baseline post back surgery a few years ago Light Touch Impaired Details: Impaired LLE Hot/Cold: Appears  Intact Proprioception: Appears Intact Stereognosis: Appears Intact Coordination Gross Motor Movements are Fluid and Coordinated: Yes Fine Motor Movements are Fluid and Coordinated: Yes Finger Nose Finger Test: unable to complete on the L Heel Shin Test: WLF Motor  Motor Motor: Within Functional Limits Motor - Discharge Observations: LE motor WNL  Mobility Bed Mobility Bed Mobility: Rolling Right;Rolling Left;Supine to Sit;Sit to Supine Rolling Right: Independent Rolling Left: Independent Supine to Sit: Independent with assistive device Sit to Supine: Independent with assistive device Transfers Transfers: Sit to Stand;Stand Pivot Transfers Sit to Stand: Supervision/Verbal cueing Stand Pivot Transfers: Supervision/Verbal cueing Stand Pivot Transfer Details: Verbal cues for precautions/safety;Verbal cues for technique Stand Pivot Transfer Details (indicate cue type and reason): needs ongoing VC for correct hand placement and for safety/to reduce impulsivity when more fatigued Transfer (Assistive device): Hemi-walker Locomotion  Gait Ambulation: Yes Gait Assistance: Supervision/Verbal cueing Gait Distance (Feet): 50 Feet Assistive device: Hemi-walker Gait Assistance Details: Verbal cues for safe use of DME/AE;Verbal cues for precautions/safety;Verbal cues for technique Gait Assistance Details: tends to need more cues for safety and closer guarding when fatigued Gait Gait: Yes Gait Pattern: Impaired Gait Pattern: Step-to pattern;Narrow base of support;Trunk flexed;Decreased trunk rotation Gait velocity: decr Stairs / Additional Locomotion Stairs: Yes Stairs Assistance: Minimal Assistance - Patient > 75% Stair Management Technique: One rail Right Number of Stairs: 4 Height of Stairs: 6 Curb: CNurse, mental healthMobility: Yes Wheelchair Assistance: SChartered loss adjuster Both lower extermities;Right upper  extremity Wheelchair Parts Management: Needs assistance Distance: 1517f Trunk/Postural Assessment  Cervical Assessment Cervical Assessment: Within Functional Limits Thoracic Assessment Thoracic Assessment: Within Functional Limits Lumbar Assessment Lumbar Assessment: Within Functional Limits Postural Control Postural Control: Within Functional Limits  Balance Balance Balance Assessed: Yes Static Sitting Balance Static Sitting - Level of Assistance: 7: Independent Dynamic Sitting Balance Dynamic Sitting - Balance Support: During functional  activity Dynamic Sitting - Level of Assistance: 5: Stand by assistance Static Standing Balance Static Standing - Balance Support: Right upper extremity supported Static Standing - Level of Assistance: 5: Stand by assistance Dynamic Standing Balance Dynamic Standing - Balance Support: No upper extremity supported;During functional activity Dynamic Standing - Level of Assistance: 5: Stand by assistance Dynamic Standing - Balance Activities: Lateral lean/weight shifting;Forward lean/weight shifting;Reaching for objects Extremity Assessment  RUE Assessment RUE Assessment: Not tested LUE Assessment LUE Assessment: Not tested General Strength Comments: shoulder and fingers WFL, pt is in a short arm cast from MP joints to upper forearm RLE Assessment RLE Assessment: Within Functional Limits Active Range of Motion (AROM) Comments: functionally WNL General Strength Comments: ankle DF 5/5, quad 5/5, sitting hip ABD 4/5, seated marches 4-/5 LLE Assessment LLE Assessment: Within Functional Limits Active Range of Motion (AROM) Comments: functionally WNL General Strength Comments: ankle DF 5/5, quad 5/5, sitting hip ABD 4/5, seated marches 4-/5    Windell Norfolk, DPT, PN1   Supplemental Physical Therapist [place]    Pager [phone] Acute Rehab Office [phone]

## 2020-01-19 NOTE — Consult Note (Signed)
[facility] Nurse ostomy follow up Patient receiving care in [facility] 4W26. Stoma type/location: LMQ colostomy Stomal assessment/size: 1 1/4 inches, round, budded, moist, sutures intact Peristomal assessment: slight skin irritation from 4-7 o'clock.  Protected with skin prep and barrier ring Treatment options for stomal/peristomal skin: skin prep and barrier ring Output soft brown stool Ostomy pouching: 1pc. Flat with barrier ring Education provided:  Enrolled patient in [employer] Discharge program: Yes and supplies have arrived at the patient's house per her daughter Jana Half. Jana Half performed the following ostomy care with stand by assistance and minimal guidance: Burping, emptying, cleaning the tail of the existing pouch. Removal of existing pouch and cleansing of peristomal skin.  Sizing, cutting, fitting of new pouch with barrier ring.  Jana Half did the change asking appropriate questions and without hesitation or obvious fear. Additional supplies requested (Pouch, Pattricia Boss; barrier rings, Kellie Simmering [phone] - 6 of each; and one box of skin barrier wipes [id number]). Val Riles, RN, MSN, CWOCN, CNS-BC, pager [phone]

## 2020-01-19 NOTE — Patient Care Conference (Addendum)
Inpatient RehabilitationTeam Conference and Plan of Care Update Date: 01/19/2020   Time: 11:15 AM    Patient Name: [name]      Medical Record Number: [id number]  Date of Birth: 1923/04/17 Sex: Female         Room/Bed: 4W26C/4W26C-01 Payor Info: Payor: HUMANA MEDICARE / Plan: HUMANA MEDICARE CHOICE PPO / Product Type: *No Product type* /    Admit Date/Time:  01/14/2020  3:47 PM  Primary Diagnosis:  Trauma  Patient Active Problem List   Diagnosis Date Noted  . Distal radius fracture, left   . Left radial fracture 01/17/2020  . Leucocytosis   . Anemia   . Peritonitis ([place])   . Debility 01/14/2020  . Physical debility 01/14/2020  . Trauma   . Postoperative pain   . Urinary retention   . New onset atrial fibrillation ([place])   . Protein-calorie malnutrition, severe 01/13/2020  . Acute renal failure superimposed on stage 3a chronic kidney disease ([place]) 12/29/2019  . Closed fracture of multiple ribs 12/29/2019  . Closed fracture of left distal radius 12/29/2019  . Recurrent falls 12/29/2019  . Positive ANA (antinuclear antibody) 12/29/2019  . Osteoarthritis 11/13/2017  . Hypertriglyceridemia 01/29/2017  . Excessive gas 01/09/2017  . Preoperative cardiovascular examination 07/31/2016  . Elevated lipase 03/06/2016  . Osteopenia 02/08/2016  . Hypothyroidism 01/26/2016  . HTN (hypertension) 01/26/2016  . Left leg numbness 10/06/2012    Expected Discharge Date: Expected Discharge Date: 01/20/20  Team Members Present: Physician leading conference: Dr. Delice Lesch Care Coodinator Present: Karene Fry, RN, MSN;Deborah Tobin Chad, RN, BSN, CRRN Nurse Present: Judee Clara, LPN PT Present: Deniece Ree, PT OT Present: Meriel Pica, OT PPS Coordinator present : Gunnar Fusi, SLP     Current Status/Progress Goal Weekly Team Focus  Bowel/Bladder   Patient continent of bladder, monitoring PVR/bladder scan. Colostomy putting out soft brown stool.  Maintain continence, monitor  PVRs.  Continue continent voids and monitor PVRs. Monitor colostomy output.   Swallow/Nutrition/ Hydration             ADL's   CGA sit to stand and transfers with hemiwalker, min A bathing, LB dressing, toileting (at goal level for self care)  min A bathing, LB dressing, toileting; S toilet transfers and sit to stand  Pt/family education, ADL training, functional mobility   Mobility   up to [place] bed mobility, MinA with hemiwalker transfers, gait up to 63ft with hemiwalker. Fatigues very easy and gets very unsafe, will likely need WC.  S assist all mobility  gait pattern, safe technique for mobility, steps, endurance   Communication             Safety/Cognition/ Behavioral Observations            Pain   Patient reporting minimal pain, PRN tylenol and oxy.  Tolerable pain level  Monitor pain level, utilize PRN medications as needed.   Skin   Hard cast to L arm, colostomy, and abdominal incision with moist to dry dressing BID. No other skin issues.  Monitor dressings/wounds and prevent skin breakdown.  Change abdominal dressing BID, monitor for signs of healing or infection.    Rehab Goals Patient on target to meet rehab goals: Yes *See Care Plan and progress notes for long and short-term goals.     Barriers to Discharge  Current Status/Progress Possible Resolutions Date Resolved   Nursing                  PT  Home environment  access/layout;Weight bearing restrictions;Inaccessible home environment  per family doorways at home could be a little narrow for WC (mostly bathroom doors); family planning to rotate to provide constant assist  family ed completed 3/9           OT                  SLP                SW   Colostomy management; patient unable to complete due to right UE fx. Education with daughter/WOC consult. Daughter/son plans to hire help to provide assistance when they are at work          Discharge Planning/Teaching Needs:  Home with hired help  Family education  01/18/20 with daughter: transfers, toileting, hygiene, dressing changes, ostomy care/management   Team Discussion: Med stable, ortho changed LUE cast, has ostomy, abx for peritonitis, WBC are up, ?home tomorrow.  RN ostomy, pain, given tylenol.  OT min A overall, cast limits reach, fam ed done yesterday.  PT at goal level, fam ed done yesterday.   Revisions to Treatment Plan: N/A     Medical Summary Current Status: Deficits with mobility, transfers, self-care secondary to multi-- Ortho Weekly Focus/Goal: Improve mobility, WBCs, Afib, post-op pain, peritonitis  Barriers to Discharge: Other (comments);Medical stability  Barriers to Discharge Comments: Ostomy with LUE cast Possible Resolutions to Barriers: Therapies, making good progress, plan for d/c tomorrow, abx to be completed   Continued Need for Acute Rehabilitation Level of Care: The patient requires daily medical management by a physician with specialized training in physical medicine and rehabilitation for the following reasons: Direction of a multidisciplinary physical rehabilitation program to maximize functional independence : Yes Medical management of patient stability for increased activity during participation in an intensive rehabilitation regime.: Yes Analysis of laboratory values and/or radiology reports with any subsequent need for medication adjustment and/or medical intervention. : Yes   I attest that I was present, lead the team conference, and concur with the assessment and plan of the team.   [name] 01/20/2020, 3:49 PM   Team conference was held via web/ teleconference due to [place] - 19

## 2020-01-19 NOTE — Progress Notes (Signed)
Physical Therapy Session Note  Patient Details  Name: [name] MRN: [id number] Date of Birth: [birth year]  Today's Date: 01/19/2020 PT Individual Time: 0905-1003 PT Individual Time Calculation (min): 58 min   Short Term Goals: Week 1:  PT Short Term Goal 1 (Week 1): Pt will perform transfers with CGA and LRAD  Skilled Therapeutic Interventions/Progress Updates:     Patient received up in chair, pleasant and reports she is feeling better today. Mobility improved today, able to perform bed mobility, functional transfers, and gait approximately 32f with hemiwalker and close S/verbal cuing for safety. Continues to have issues remembering hand placement mostly for stand to sit requiring most verbal cuing during transfers at this point. Also continues to have significant functional activity tolerance deficits- and when fatigue hits, she continues to require heavier level of cuing and guarding due to reduced safety awareness and mild impulsivity when tired. While practicing curb today, she required ModA due to balance loss when stepping up onto curb, otherwise able to perform with S. Very fatigued after practicing the curb and refused to participate during the rest of her time in therapy due to fatigue- "I'm just washed out!!!!". [facility] up in the recliner with alarm active and all needs otherwise met this morning. 17 minutes of skilled PT time missed due to patient fatigue/refusal to continue with session.   Therapy Documentation Precautions:  Precautions Precautions: Fall Precaution Comments: ostomy, abdominal wound Restrictions Weight Bearing Restrictions: Yes LUE Weight Bearing: Non weight bearing General: PT Amount of Missed Time (min): 17 Minutes PT Missed Treatment Reason: Patient fatigue Pain: Pain Assessment Pain Scale: 0-10 Pain Score: 2  Pain Location: Arm Pain Orientation: Left Pain Descriptors / Indicators: Aching;Discomfort Pain Onset:  On-going Patients Stated Pain Goal: 0 Pain Intervention(s): Repositioned;Ambulation/increased activity;Elevated extremity;Other (Comment)(sling) Multiple Pain Sites: No    Therapy/Group: Individual Therapy   KWindell Norfolk DPT, PN1   Supplemental Physical Therapist [place]   Pager [phone] Rehab Office [phone]   01/19/2020, 11:52 AM

## 2020-01-19 NOTE — Progress Notes (Signed)
Occupational Therapy Session Note  Patient Details  Name: [name] MRN: [id number] Date of Birth: Mar 16, 1923  Today's Date: 01/19/2020 OT Individual Time: 1130-1200 OT Individual Time Calculation (min): 30 min    Short Term Goals: Week 1:  OT Short Term Goal 1 (Week 1): Pt will complete BSC or toilet transfer with Min A and LRAD OT Short Term Goal 2 (Week 1): Pt will complete sit<stand during LB self care with Min A OT Short Term Goal 3 (Week 1): Pt will complete UB dressing with Mod A using adaptive techniques as needed  Skilled Therapeutic Interventions/Progress Updates:    Pt seen this session for ADL training with a focus on functional mobility and adaptive strategies due to her cast. Overall she completed toilet transfers and mobility in the room with S.  She needs min A with bathing, toileting and dressing to wash R arm and pull clothing over L side of hips. Pt is limited due to L arm in cast.  Pt feel ready to go home tomorrow as she had a great deal of family support.   Pt resting in recliner with chair pad alarm on   Therapy Documentation Precautions:  Precautions Precautions: Fall Precaution Comments: ostomy, abdominal wound Restrictions Weight Bearing Restrictions: Yes LUE Weight Bearing: Non weight bearing     Pain: Pain Assessment Pain Scale: 0-10 Pain Score: 2  Pain Location: Arm Pain Orientation: Left Pain Descriptors / Indicators: Aching;Discomfort Pain Onset: On-going Patients Stated Pain Goal: 0 Pain Intervention(s): Repositioned;Ambulation/increased activity;Elevated extremity;Other (Comment)(sling) Multiple Pain Sites: No ADL: ADL Eating: Set up Grooming: Supervision/safety Where Assessed-Grooming: Standing at sink Upper Body Bathing: Minimal assistance Where Assessed-Upper Body Bathing: Sitting at sink Lower Body Bathing: Supervision/safety Where Assessed-Lower Body Bathing: Standing at sink, Sitting at sink Upper Body Dressing:  Supervision/safety Where Assessed-Upper Body Dressing: Sitting at sink Lower Body Dressing: Minimal assistance Where Assessed-Lower Body Dressing: Sitting at sink Toileting: Minimal assistance Where Assessed-Toileting: Glass blower/designer: Close supervision Toilet Transfer Method: Counselling psychologist: Raised toilet seat Tub/Shower Transfer: Not assessed  Therapy/Group: Individual Therapy  [place] 01/19/2020, 12:50 PM

## 2020-01-19 NOTE — Progress Notes (Signed)
[place] PHYSICAL MEDICINE & REHABILITATION PROGRESS NOTE   Subjective/Complaints: Patient seen sitting up in bed this morning.  She states she slept well overnight.  She tells me she is going home tomorrow.  She denies complaints.  ROS: Denies CP, SOB, N/V/D  Objective:   DG Wrist 2 Views Left  Result Date: 01/17/2020 CLINICAL DATA:  Fracture EXAM: LEFT WRIST - 2 VIEW COMPARISON:  12/29/2019 FINDINGS: Interval casting material obscures bone detail. Longer fixating plate and multiple screws now evident within the radius across acute proximal shaft fracture with anatomic alignment. Old fracture distal radius and ulna. IMPRESSION: Surgically fixated distal radius fracture with anatomic alignment. Electronically Signed   By: Donavan Foil M.D.   On: 01/17/2020 17:12   Recent Labs    01/18/20 0532 01/19/20 0501  WBC 12.0* 13.3*  HGB 10.2* 10.1*  HCT 32.5* 32.7*  PLT 551* 571*   Recent Labs    01/17/20 0514  NA 141  K 3.6  CL 109  CO2 24  GLUCOSE 109*  BUN 20  CREATININE 0.85  CALCIUM 8.6*    Intake/Output Summary (Last 24 hours) at 01/19/2020 0852 Last data filed at 01/19/2020 0721 Gross per 24 hour  Intake 610 ml  Output 100 ml  Net 510 ml     Physical Exam: Vital Signs Blood pressure 125/64, pulse (!) 52, temperature 98 F (36.7 C), resp. rate 18, height 5\' 6"  (1.676 m), weight 69.4 kg, SpO2 99 %. Constitutional: No distress . Vital signs reviewed. HENT: Normocephalic.  Atraumatic. Eyes: EOMI. No discharge. Cardiovascular: No JVD. Respiratory: Normal effort.  No stridor. GI: Non-distended. +Ostomy.  Skin: Left upper extremity with hard cast with proximal ecchymosis.  Psych: Normal mood.  Normal behavior. Musc: Left upper extremity with edema and tenderness Neurological: Alert HOH Motor: RUE: 4+/5 proximal distal LUE: Shoulder abduction 2+/5, elbow flexion/extension 3 -/5, proximal limited by cast, hand grip 4/5, mild improvement Bilateral lower extremities:  4+/5 proximal to distal, improving  Assessment/Plan: 1. Functional deficits secondary to multi-- Ortho. which require 3+ hours per day of interdisciplinary therapy in a comprehensive inpatient rehab setting.  Physiatrist is providing close team supervision and 24 hour management of active medical problems listed below.  Physiatrist and rehab team continue to assess barriers to discharge/monitor patient progress toward functional and medical goals  Care Tool:  Bathing    Body parts bathed by patient: Left arm, Chest, Abdomen(UB only today)   Body parts bathed by helper: Right arm Body parts n/a: Left arm, Abdomen   Bathing assist Assist Level: Minimal Assistance - Patient > 75%     Upper Body Dressing/Undressing Upper body dressing   What is the patient wearing?: Pull over shirt    Upper body assist Assist Level: Supervision/Verbal cueing    Lower Body Dressing/Undressing Lower body dressing      What is the patient wearing?: Pants, Underwear/pull up     Lower body assist Assist for lower body dressing: Minimal Assistance - Patient > 75%     Toileting Toileting    Toileting assist Assist for toileting: Minimal Assistance - Patient > 75%     Transfers Chair/bed transfer  Transfers assist     Chair/bed transfer assist level: Contact Guard/Touching assist     Locomotion Ambulation   Ambulation assist      Assist level: Contact Guard/Touching assist Assistive device: Walker-hemi Max distance: 40   Walk 10 feet activity   Assist     Assist level: Contact Guard/Touching assist Assistive  device: Walker-hemi   Walk 50 feet activity   Assist Walk 50 feet with 2 turns activity did not occur: Safety/medical concerns         Walk 150 feet activity   Assist Walk 150 feet activity did not occur: Safety/medical concerns         Walk 10 feet on uneven surface  activity   Assist Walk 10 feet on uneven surfaces activity did not occur:  Safety/medical concerns         Wheelchair     Assist Will patient use wheelchair at discharge?: Yes Type of Wheelchair: Manual    Wheelchair assist level: Minimal Assistance - Patient > 75% Max wheelchair distance: 150    Wheelchair 50 feet with 2 turns activity    Assist        Assist Level: Minimal Assistance - Patient > 75%   Wheelchair 150 feet activity     Assist      Assist Level: Minimal Assistance - Patient > 75%   Blood pressure 125/64, pulse (!) 52, temperature 98 F (36.7 C), resp. rate 18, height 5\' 6"  (1.676 m), weight 69.4 kg, SpO2 99 %.  Medical Problem List and Plan: 1.  Deficits with mobility, transfers, self-care secondary to multi-- Ortho.  Continue CIR   Team conference today to discuss current and goals and coordination of care, home and environmental barriers, and discharge planning with nursing, case manager, and therapies.  2.  Antithrombotics: -DVT/anticoagulation:  Pharmaceutical: Other (comment)--Elquis             -antiplatelet therapy: N/A 3. Pain Management: Continue Oxycodone prn             Controlled with meds on 3/10  Monitor with increased exertion.  4. Mood: LCSW to follow for evaluation and support.              -antipsychotic agents: N/a 5. Neuropsych: This patient is capable of making decisions on her own behalf. 6. Skin/Wound Care: Added vitamin C/multivitamin to promote wound healing.  7. Fluids/Electrolytes/Nutrition: Started on soft diet.  Advance diet as tolerated offer supplements between meals.   BMP within acceptable range on 3/8 8.  Left wrist fracture s/p internal fixation: NWB LUE-changed to short arm cast on 3/8 per Ortho 9. Perforated diverticulum s/p repair with colostomy:  Family refusing education and patient unable to perform care due to right wrist fracture.   Need to  Educate/encourage patient and family on care.  10. Purulent peritonitis: Zosyn changed to Augmentin X 7 days (through today). CCS  does not feel repeat CT abdomen/pelvis needed.  11. New onset A fib with RVR: ON amiodarone, metoprolol and Cardizem (added on 03/4).   On 3/6 amiodarone was decreased to 100mg  daily and K+ supplement was started.   Rate controlled on 3/10             Monitor with increased activity. Well controlled.  12. Polymyalgia rheumatica: On prednisone--on vitamin A to mitigate steroid effect on wound healing.  13. Urinary retention: Monitor for retention.  PVRs unremarkable.  Improving 14. Hypothyroid: Continue Synthroid.   14. Anemia:   Hgb 10.1 on 3/10  Continue to monitor.  15.  Leukocytosis-likely related to steroids  WBCs 13.3 on 3/10  Afebrile  Continue to monitor  LOS: 5 days A FACE TO FACE EVALUATION WAS PERFORMED  Tashiana Lamarca Lorie Phenix 01/19/2020, 8:52 AM

## 2020-01-19 NOTE — Progress Notes (Signed)
Occupational Therapy Discharge Summary  Patient Details  Name: [name] MRN: [id number] Date of Birth: 1922-11-20   Patient has met 6 of 6 long term goals due to improved activity tolerance, improved balance, postural control and ability to compensate for deficits.  Patient to discharge at [facility] Assist level.  Patient's care partner is independent to provide the necessary physical and cognitive assistance at discharge.    Reasons goals not met: n/a  Recommendation:  Patient will benefit from ongoing skilled OT services in home health setting to continue to advance functional skills in the area of BADL.  Equipment: BSC  Reasons for discharge: treatment goals met  Patient/family agrees with progress made and goals achieved: Yes  OT Discharge Precautions/Restrictions  Precautions Precautions: Fall Precaution Comments: ostomy, abdominal wound Restrictions LUE Weight Bearing: Non weight bearing  ADL ADL Eating: Set up Grooming: Supervision/safety Where Assessed-Grooming: Standing at sink Upper Body Bathing: Minimal assistance Where Assessed-Upper Body Bathing: Sitting at sink Lower Body Bathing: Supervision/safety Where Assessed-Lower Body Bathing: Standing at sink, Sitting at sink Upper Body Dressing: Supervision/safety Where Assessed-Upper Body Dressing: Sitting at sink Lower Body Dressing: Minimal assistance Where Assessed-Lower Body Dressing: Sitting at sink Toileting: Minimal assistance Where Assessed-Toileting: Glass blower/designer: Close supervision Toilet Transfer Method: Counselling psychologist: Raised Adult nurse: Not assessed Vision Baseline Vision/History: Wears glasses Patient Visual Report: No change from baseline Vision Assessment?: No apparent visual deficits Perception  Perception: Within Functional Limits Praxis Praxis: Intact Cognition Overall Cognitive Status: Within Functional Limits for tasks  assessed Sensation Sensation Peripheral sensation comments: 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, per pt this is baseline post back surgery a few years ago Hot/Cold: Appears Intact Proprioception: Appears Intact Stereognosis: Appears Intact Coordination Gross Motor Movements are Fluid and Coordinated: Yes Fine Motor Movements are Fluid and Coordinated: Yes Motor  Motor Motor: Within Functional Limits Mobility  Transfers Sit to Stand: Supervision/Verbal cueing  Trunk/Postural Assessment  Cervical Assessment Cervical Assessment: Within Functional Limits Thoracic Assessment Thoracic Assessment: Within Functional Limits Lumbar Assessment Lumbar Assessment: Within Functional Limits Postural Control Postural Control: Within Functional Limits  Balance Static Sitting Balance Static Sitting - Level of Assistance: 7: Independent Dynamic Sitting Balance Dynamic Sitting - Level of Assistance: 5: Stand by assistance Static Standing Balance Static Standing - Level of Assistance: 5: Stand by assistance Dynamic Standing Balance Dynamic Standing - Level of Assistance: 5: Stand by assistance Extremity/Trunk Assessment RUE Assessment RUE Assessment: Within Functional Limits LUE Assessment General Strength Comments: shoulder and fingers WFL, pt is in a short arm cast from MP joints to upper forearm   [name] 01/19/2020, 12:55 PM

## 2020-01-19 NOTE — Progress Notes (Signed)
Physical Therapy Session Note  Patient Details  Name: CHIANTE PEDEN MRN: [id number] Date of Birth: 02-18-1923  Today's Date: 01/19/2020 PT Individual Time: 6701-4103 PT Individual Time Calculation (min): 62 min   Short Term Goals: Week 1:  PT Short Term Goal 1 (Week 1): Pt will perform transfers with CGA and LRAD  Skilled Therapeutic Interventions/Progress Updates:    Patient received in recliner, reports she is feeling better this afternoon. Able to perform transfers with S to light min guard but more fatigued this afternoon than this morning. Continues to require MinA for car transfer- BUT, note that car currently very low and unable to raise it at this time so she was standing from a very low surface and would likely do better from higher car. Continued to practice stairs, she remains very anxious and required ongoing MinA with U railing as well as ongoing cues for safety on steps, also becomes easily fatigued by stair navigation. Otherwise practiced functional balance tasks in narrow BOS and tandem stance while tossing bean bags at target, then returned to her room and completed toilet transfer with min guard. She remains easily fatigued and needs extended rest breaks between tasks. Further education provided to family for correct hand placement and regarding energy conservation/safety regarding fatigue at home. She was left up in the chair with all needs met, chair alarm active and daughter present.   Therapy Documentation Precautions:  Precautions Precautions: Fall Precaution Comments: ostomy, abdominal wound Restrictions Weight Bearing Restrictions: Yes LUE Weight Bearing: Non weight bearing   Pain: 0/10, no complaints of pain       Therapy/Group: Individual Therapy   Windell Norfolk, DPT, PN1   Supplemental Physical Therapist [place]    Pager [phone] Acute Rehab Office [phone]    01/19/2020, 3:50 PM

## 2020-01-20 ENCOUNTER — Encounter: Payer: Self-pay | Admitting: Family Medicine

## 2020-01-20 DIAGNOSIS — N1831 Chronic kidney disease, stage 3a: Secondary | ICD-10-CM

## 2020-01-20 DIAGNOSIS — N179 Acute kidney failure, unspecified: Secondary | ICD-10-CM

## 2020-01-20 LAB — CBC
HCT: 32.7 % — ABNORMAL LOW (ref 36.0–46.0)
Hemoglobin: 10.4 g/dL — ABNORMAL LOW (ref 12.0–15.0)
MCH: 30 pg (ref 26.0–34.0)
MCHC: 31.8 g/dL (ref 30.0–36.0)
MCV: 94.2 fL (ref 80.0–100.0)
Platelets: 552 10*3/uL — ABNORMAL HIGH (ref 150–400)
RBC: 3.47 MIL/uL — ABNORMAL LOW (ref 3.87–5.11)
RDW: 16.1 % — ABNORMAL HIGH (ref 11.5–15.5)
WBC: 12 10*3/uL — ABNORMAL HIGH (ref 4.0–10.5)
nRBC: 0 % (ref 0.0–0.2)

## 2020-01-20 MED ORDER — OXYCODONE HCL 5 MG PO TABS: 2.5000 mg | ORAL_TABLET | Freq: Two times a day (BID) | ORAL | 0 refills | Status: DC | PRN

## 2020-01-20 MED ORDER — SACCHAROMYCES BOULARDII 250 MG PO CAPS: 250.0000 mg | ORAL_CAPSULE | Freq: Two times a day (BID) | ORAL | 0 refills | Status: DC

## 2020-01-20 MED ORDER — APIXABAN 5 MG PO TABS: 5.0000 mg | ORAL_TABLET | Freq: Two times a day (BID) | ORAL | 0 refills | Status: DC

## 2020-01-20 MED ORDER — POTASSIUM CHLORIDE ER 10 MEQ PO TBCR: 10.0000 meq | EXTENDED_RELEASE_TABLET | Freq: Two times a day (BID) | ORAL | 0 refills | Status: DC

## 2020-01-20 MED ORDER — SENNOSIDES-DOCUSATE SODIUM 8.6-50 MG PO TABS: 1.0000 | ORAL_TABLET | Freq: Every day | ORAL | 0 refills | Status: AC

## 2020-01-20 MED ORDER — VITAMIN A 10000 UNITS PO CAPS: 10000.0000 [IU] | ORAL_CAPSULE | Freq: Every day | ORAL | 0 refills | Status: DC

## 2020-01-20 MED ORDER — METOPROLOL TARTRATE 25 MG PO TABS: 25.0000 mg | ORAL_TABLET | Freq: Two times a day (BID) | ORAL | 0 refills | Status: DC

## 2020-01-20 MED ORDER — DILTIAZEM HCL ER COATED BEADS 120 MG PO CP24: 120.0000 mg | ORAL_CAPSULE | Freq: Every day | ORAL | 0 refills | Status: DC

## 2020-01-20 MED ORDER — DOCUSATE SODIUM 100 MG PO CAPS: 100.0000 mg | ORAL_CAPSULE | Freq: Two times a day (BID) | ORAL | 0 refills | Status: DC

## 2020-01-20 MED ORDER — AMIODARONE HCL 200 MG PO TABS: 200.0000 mg | ORAL_TABLET | Freq: Every day | ORAL | 0 refills | Status: DC

## 2020-01-20 NOTE — Care Management (Addendum)
   The overall goal for the admission was met for:   Discharge location: Home with family/Hired Help from [facility]  Length of Stay: 6 days with discharge 01/20/20  Discharge activity level: Patient to discharge at ambulatory distance up to 19f, otherwise mobilizing via [place] Supervision.  [facility]  Home/community participation: Limited participation  Services provided included: MD, RD, PT, OT, SLP, RN, CM, TR, Pharmacy, [place]  Follow-up services arranged: Home Health: [facility]<OT< [facility], [facility] [place] agency HH: [facility], DME: None  Comments (or additional information):  Patient/Family verbalized understanding of follow-up arrangements: Yes  Family Education with family 01/18/20 Patient's care partner(s) are independent to provide the necessary physical and cognitive assistance at discharge.    Individual responsible for coordination of the follow-up plan: Daughter MLubertha [phone] Confirmed correct [facility] Order entered with request to deliver to home when they come in next week and contact number for pick up if unable to deliver to home.    SMargarito Liner3/09/2020    SMargarito Liner

## 2020-01-20 NOTE — Progress Notes (Signed)
[place] PHYSICAL MEDICINE & REHABILITATION PROGRESS NOTE   Subjective/Complaints: Patient seen sitting up in bed this morning.  She states she slept well overnight.  She states she is ready for discharge.  She has questions regarding discharge medications.  ROS: Denies CP, SOB, N/V/D  Objective:   No results found. Recent Labs    01/19/20 0501 01/20/20 0535  WBC 13.3* 12.0*  HGB 10.1* 10.4*  HCT 32.7* 32.7*  PLT 571* 552*   No results for input(s): NA, K, CL, CO2, GLUCOSE, BUN, CREATININE, CALCIUM in the last 72 hours.  Intake/Output Summary (Last 24 hours) at 01/20/2020 0919 Last data filed at 01/19/2020 1741 Gross per 24 hour  Intake 290 ml  Output 0 ml  Net 290 ml     Physical Exam: Vital Signs Blood pressure 124/61, pulse 67, temperature 97.7 F (36.5 C), temperature source Oral, resp. rate 16, height 5\' 6"  (1.676 m), weight 69.4 kg, SpO2 98 %. Constitutional: No distress . Vital signs reviewed. HENT: Normocephalic.  Atraumatic. Eyes: EOMI. No discharge. Cardiovascular: No JVD. Respiratory: Normal effort.  No stridor. GI: Non-distended.  + Ostomy. Skin: Left upper extremity with hard cast with proximal ecchymosis Psych: Normal mood.  Normal behavior. Musc: Left upper extremity with proximal edema and tenderness Neurological: Alert HOH Motor: RUE: 4+/5 proximal distal LUE: Shoulder abduction 2+/5, elbow flexion/extension 3 -/5, proximal limited by cast, hand grip 4/5, stable Bilateral lower extremities: 4+/5 proximal to distal, improving  Assessment/Plan: 1. Functional deficits secondary to multi-- Ortho. which require 3+ hours per day of interdisciplinary therapy in a comprehensive inpatient rehab setting.  Physiatrist is providing close team supervision and 24 hour management of active medical problems listed below.  Physiatrist and rehab team continue to assess barriers to discharge/monitor patient progress toward functional and medical goals  Care  Tool:  Bathing    Body parts bathed by patient: Left arm, Chest, Abdomen, Front perineal area, Buttocks, Right upper leg, Left upper leg, Right lower leg, Left lower leg, Face   Body parts bathed by helper: Right arm Body parts n/a: Left arm, Abdomen   Bathing assist Assist Level: Minimal Assistance - Patient > 75%     Upper Body Dressing/Undressing Upper body dressing   What is the patient wearing?: Button up shirt    Upper body assist Assist Level: Supervision/Verbal cueing    Lower Body Dressing/Undressing Lower body dressing      What is the patient wearing?: Pants, Underwear/pull up     Lower body assist Assist for lower body dressing: Minimal Assistance - Patient > 75%     Toileting Toileting    Toileting assist Assist for toileting: Minimal Assistance - Patient > 75%     Transfers Chair/bed transfer  Transfers assist     Chair/bed transfer assist level: Supervision/Verbal cueing     Locomotion Ambulation   Ambulation assist      Assist level: Supervision/Verbal cueing Assistive device: Walker-hemi Max distance: 3ft   Walk 10 feet activity   Assist     Assist level: Supervision/Verbal cueing Assistive device: Walker-hemi   Walk 50 feet activity   Assist Walk 50 feet with 2 turns activity did not occur: Safety/medical concerns  Assist level: Supervision/Verbal cueing Assistive device: Walker-hemi    Walk 150 feet activity   Assist Walk 150 feet activity did not occur: Safety/medical concerns(fatigue)         Walk 10 feet on uneven surface  activity   Assist Walk 10 feet on uneven surfaces activity  did not occur: Safety/medical concerns   Assist level: Supervision/Verbal cueing Assistive device: Tree surgeon Will patient use wheelchair at discharge?: Yes Type of Wheelchair: Manual    Wheelchair assist level: Supervision/Verbal cueing Max wheelchair distance: 150    Wheelchair 50 feet  with 2 turns activity    Assist        Assist Level: Supervision/Verbal cueing   Wheelchair 150 feet activity     Assist      Assist Level: Supervision/Verbal cueing   Blood pressure 124/61, pulse 67, temperature 97.7 F (36.5 C), temperature source Oral, resp. rate 16, height 5\' 6"  (1.676 m), weight 69.4 kg, SpO2 98 %.  Medical Problem List and Plan: 1.  Deficits with mobility, transfers, self-care secondary to multi-- Ortho.  DC today  Will see patient for hospital follow-up in 1 month post-discharge 2.  Antithrombotics: -DVT/anticoagulation:  Pharmaceutical: Other (comment)--Elquis             -antiplatelet therapy: N/A 3. Pain Management: Continue Oxycodone prn             Controlled with meds on 3/11  Monitor with increased exertion.  4. Mood: LCSW to follow for evaluation and support.              -antipsychotic agents: N/a 5. Neuropsych: This patient is capable of making decisions on her own behalf. 6. Skin/Wound Care: Added vitamin C/multivitamin to promote wound healing.  7. Fluids/Electrolytes/Nutrition: Started on soft diet.  Advance diet as tolerated offer supplements between meals.   BMP within acceptable range on 3/8 8.  Left wrist fracture s/p internal fixation: NWB LUE-changed to short arm cast on 3/8 per Ortho 9. Perforated diverticulum s/p repair with colostomy:  Family refusing education and patient unable to perform care due to right wrist fracture.   Need to  Educate/encourage patient and family on care.  10. Purulent peritonitis: Zosyn changed to Augmentin X 7 days, completed course on 3/10. CCS does not feel repeat CT abdomen/pelvis needed.  11. New onset A fib with RVR: ON amiodarone, metoprolol and Cardizem (added on 03/4).   On 3/6 amiodarone was decreased to 100mg  daily and K+ supplement was started.   Rate controlled on 3/11             Monitor with increased activity. Well controlled.  12. Polymyalgia rheumatica: On prednisone--on vitamin A  to mitigate steroid effect on wound healing.  13. Urinary retention: Monitor for retention.  PVRs unremarkable.  Improving 14. Hypothyroid: Continue Synthroid.   14. Anemia:   Hgb 10.4 on 3/11  Continue to monitor.  15.  Leukocytosis-likely related to steroids  WBCs 12.0 on 3/11  Afebrile  Continue to monitor  LOS: 6 days A FACE TO FACE EVALUATION WAS PERFORMED  Krista Godsil Lorie Phenix 01/20/2020, 9:19 AM

## 2020-01-20 NOTE — Progress Notes (Signed)
Team Conference Report to Patient/Family  Team Conference discussion was reviewed with the patient and daughter, including goals for supervision, any changes in plan of care and target discharge date.  Patient and caregiver express understanding and are in agreement.  The patient has a target discharge date of 01/20/20.Reviewed order and delivery of a hospital bed and request for for wheelchair. Bed to be delivered 01/19/20. Education for ostomy care with WOC went well. Daughter noted she feels better prepared to manage the ostomy at discharge.  [name] B 01/20/2020, 1:06 PM

## 2020-01-20 NOTE — Progress Notes (Signed)
Patient discharged home to family, all belongings sent with patient. No s/s of distress noted at this time. No further complications noted. Audie Clear, LPN

## 2020-01-20 NOTE — Discharge Summary (Signed)
Physician Discharge Summary  Patient ID: [name] MRN: [id number] DOB/AGE: 1923-01-05 86 y.o.  Admit date: 01/14/2020 Discharge date: 01/20/2020  Discharge Diagnoses:  Principal Problem:   Trauma Active Problems:   HTN (hypertension)   Acute renal failure superimposed on stage 3a chronic kidney disease ([place])   New onset atrial fibrillation (H[facility])   Physical debility   Left radial fracture   Leucocytosis   Anemia   Distal radius fracture, left   Discharged Condition: stable   Significant Diagnostic Studies:  DG Wrist 2 Views Left  Result Date: 01/17/2020 CLINICAL DATA:  Fracture EXAM: LEFT WRIST - 2 VIEW COMPARISON:  12/29/2019 FINDINGS: Interval casting material obscures bone detail. Longer fixating plate and multiple screws now evident within the radius across acute proximal shaft fracture with anatomic alignment. Old fracture distal radius and ulna. IMPRESSION: Surgically fixated distal radius fracture with anatomic alignment. Electronically Signed   By: Donavan Foil M.D.   On: 01/17/2020 17:12   DG Chest Port 1 View  Result Date: 01/11/2020 CLINICAL DATA:  Nausea EXAM: PORTABLE CHEST 1 VIEW COMPARISON:  12/30/2019 FINDINGS: Cardiomegaly. Left base atelectasis and small left pleural effusion. Right lung clear. Left rib fractures and remote left humeral neck fracture again noted. IMPRESSION: Left base atelectasis, small left effusion. Electronically Signed   By: Rolm Baptise M.D.   On: 01/11/2020 10:07      Labs:  Basic Metabolic Panel: Recent Labs  Lab 01/14/20 0100 01/15/20 0541 01/17/20 0514  NA 141 143 141  K 3.8 3.5 3.6  CL 106 104 109  CO2 _0 GLUCOSE 91 99 109*  BUN _1 CREATININE 1.05* 0.93 0.85  CALCIUM 8.8* 8.8* 8.6*  MG 2.0  --   --     CBC: Recent Labs  Lab 01/15/20 0541 01/16/20 0524 01/18/20 0532 01/19/20 0501 01/20/20 0535  WBC 12.1*   < > 12.0* 13.3* 12.0*  NEUTROABS 8.6*  --   --   --   --   HGB 11.0*   < > 10.2* 10.1*  10.4*  HCT 34.6*   < > 32.5* 32.7* 32.7*  MCV 95.8   < > 94.5 95.3 94.2  PLT 641*   < > 551* 571* 552*   < > = values in this interval not displayed.    CBG: No results for input(s): GLUCAP in the last 168 hours.  Brief HPI:   [name] is a 86 y.o. female with history of HTN, melanoma, peripheral neuropathy, HTN, PMR-on steroids; who was admitted on 12/29/2019 with nausea, vomiting, diarrhea and dizziness resulting in fall with left wrist pain.  Work-up revealed acute on chronic kidney disease with left rib and distal radius fractures proximal to prior ORIF.  Echo showed EF of 50% with apical septal and apical anterior hypokinesis.  She underwent removal of left distal radius hardware with internal fixation of left distal shaft by Dr. Caralyn Guile on 02/20 and postop to be NWB on LUE.  Hospital course significant for development of abdominal pain due to perforated sigmoid diverticulitis with pneumoperitoneum requiring exploratory lap with sigmoid colectomy and end colostomy by Dr. Redmond Pulling on 02/25.  She was placed on IV antibiotics for gross purulent peritonitis with leukocytosis.  Follow-up CT of 03/02 due to rising WBC to 20,000 showed 2 fluid collections LLQ right posterior pelvic area.    IR consulted for input and felt small collections were due to blood rather than abscesses.  She was transitioned from [employer]  to Augmentin and 7-day total course recommended as leukocytosis resolving. She also had issues with urinary retention and Foley was placed for decompression.  She developed A. fib with RVR on 02/26 and was treated with beta-blocker as well as IV heparin per Dr. Doylene Canard.  She required addition of Cardizem due to bouts of a flutter and was transitioned to Eliquis.  CCS felt that repeat CT abdomen not needed as patient stable and was tolerating full liquids therefore advanced to soft diet prior to discharge.  Therapy has been ongoing and she was noted to be debilitated.  CIR was recommended due  to functional decline   Hospital Course: [name] was admitted to rehab 01/14/2020 for inpatient therapies to consist of PT and OT at least three hours five days a week. Past admission physiatrist, therapy team and rehab RN have worked together to provide customized collaborative inpatient rehab.  Her blood pressures were monitored on a twice daily basis and have been stable.  Nutritional supplements as well as Vitamin C and multivitamin were added to help promote wound healing.  Midline abdominal incision is healing well and damp to dry dressings ongoing on twice daily basis.  She continues on vitamin A to mitigate steroid effects on wound healing per CCS.  PVRs were monitored and retention has resolved with increase in mobility--last PVRs at 0-50 cc.  She completed 7-day course of Augmentin and has been afebrile.  Serial CBC shows H&H and WBC has trended  down to 12.0.   She is tolerating Eliquis without side effects.  Dr. Doylene Canard has followed up on patient and recommended decreasing amiodarone 200 mg daily to   Ortho was consulted for input on distal radius fracture and her splint was changed out into a cast on 03/08.  Follow-up x-ray shows distal radius fracture to be in normal alignment s/p fixation.  She is to continue nonweightbearing on LUE and follow-up with Ortho in 2 weeks for postop check. Follow up  BMET electrolytes within normal limits.  Kdur was added due to low normal potassium levels.  Controlled with as needed use of oxycodone.  Her mood has been has been making steady progress.  Patient and family have been educated on colostomy and wound care.  She is currently at supervision level and will continue to receive follow up home health PT, home health OT and home health RN by [facility]  after discharge   Rehab course: During patient's stay in rehab weekly team conferences were held to monitor patient's progress, set goals and discuss barriers to discharge. At admission, patient  required mod assist with mobility and mod to max assist with ADL tasks.She  has had improvement in activity tolerance, balance, postural control as well as ability to compensate for deficits.  She requires supervision to min assist to complete ADL tasks. She is modified independent for transfers and is able to ambulate 50 feet with supervision and use of hemiwalker.  She requires verbal cues for safe use of DME.  Family education was completed regarding all aspects of safety and care.  Disposition: home  Diet: Cardiac diet   Special Instructions: 1.  Continue wet-to-dry dressings to midline abdominal wound. 2.  Continue NWB LUE.  Keep cast clean and dry 3.  Recommend repeat CBC/be met in couple of weeks.  Discharge Instructions    Ambulatory referral to Physical Medicine Rehab   Complete by: As directed    1-2 weeks TC appt     Allergies as  of 01/20/2020      Reactions   Codeine Other (See Comments)   Nightmares, imagining things   Keflex [cephalexin] Nausea Only   Pt ended up in ER w/ CP      Medication List    STOP taking these medications   Advil Dual Action 125-250 MG Tabs Generic drug: Ibuprofen-Acetaminophen   amoxicillin-clavulanate 875-125 MG tablet Commonly known as: AUGMENTIN   bethanechol 5 MG tablet Commonly known as: URECHOLINE   gabapentin 300 MG capsule Commonly known as: NEURONTIN   nadolol 80 MG tablet Commonly known as: CORGARD     TAKE these medications   acetaminophen 325 MG tablet Commonly known as: TYLENOL Take 1-2 tablets (325-650 mg total) by mouth every 4 (four) hours as needed for mild pain.   amiodarone 200 MG tablet Commonly known as: PACERONE Take 0.5 tablet (100 mg total) by mouth daily. Notes to patient: To control heart rhythm and rate    apixaban 5 MG Tabs tablet Commonly known as: ELIQUIS Take 1 tablet (5 mg total) by mouth 2 (two) times daily. Notes to patient: Blood thinner to prevent clots   ascorbic acid 250 MG  tablet Commonly known as: VITAMIN C Take 1 tablet (250 mg total) by mouth 2 (two) times daily. Notes to patient: Over the counter   diltiazem 120 MG 24 hr capsule Commonly known as: CARDIZEM CD Take 1 capsule (120 mg total) by mouth daily. Notes to patient: To control heart rhythm and rate    docusate sodium 100 MG capsule Commonly known as: COLACE Take 1 capsule (100 mg total) by mouth 2 (two) times daily. Notes to patient: Over the counter-- adjust to make sure that stools are semi liquid   levothyroxine 50 MCG tablet Commonly known as: SYNTHROID TAKE 1 TABLET BY MOUTH EVERY DAY   metoprolol tartrate 25 MG tablet Commonly known as: LOPRESSOR Take 1 tablet (25 mg total) by mouth 2 (two) times daily. Notes to patient: To control heart rhythm and rate    oxyCODONE 5 MG immediate release tablet--Rx# 14 pills Commonly known as: Oxy IR/ROXICODONE Take 0.5-1 tablets (2.5-5 mg total) by mouth 2 (two) times daily as needed for severe pain. What changed:   how much to take  when to take this  reasons to take this   pantoprazole 40 MG tablet Commonly known as: PROTONIX TAKE 1 TABLET BY MOUTH EVERY DAY   potassium chloride 10 MEQ tablet Commonly known as: KLOR-CON Take 1 tablet (10 mEq total) by mouth 2 (two) times daily. Notes to patient: Potassium supplement   predniSONE 5 MG tablet Commonly known as: DELTASONE Take 10 mg by mouth daily with breakfast.   PreserVision AREDS 2 Caps Take 1 capsule by mouth daily.   saccharomyces boulardii 250 MG capsule Commonly known as: FLORASTOR Take 1 capsule (250 mg total) by mouth 2 (two) times daily.   senna-docusate 8.6-50 MG tablet Commonly known as: Senokot-S Take 1 tablet by mouth at bedtime.   vitamin A 10000 UNIT capsule Take 1 capsule (10,000 Units total) by mouth daily. Start taking on: January 21, 2020      Follow-up Information    Iran Planas, MD. Call on 01/21/2020.   Specialty: Orthopedic Surgery Why: for  post op appointment Contact information: [address] [place] [address] [phone]        [name], MD. Call on 01/21/2020.   Specialty: Family Medicine Why: for post hospital follow up Contact information: [address] [place] [address] [phone]        Jamse Arn, MD Follow up.   Specialty: Physical Medicine and Rehabilitation Why: Office will call you with follow up appointment Contact information: [address] [place] [address] [phone]        Dixie Dials, MD. Call.   Specialty: Cardiology Why: for follow up appointment on Afib Contact information: [place] [address] [phone]        Greer Pickerel, MD. Call.   Specialty: General Surgery Why: for appointment Contact information: [place] [address] [phone]           Signed: Bary Leriche 01/21/2020, 9:48 AM

## 2020-01-21 ENCOUNTER — Ambulatory Visit (INDEPENDENT_AMBULATORY_CARE_PROVIDER_SITE_OTHER): Payer: Medicare PPO | Admitting: Physician Assistant

## 2020-01-21 ENCOUNTER — Telehealth: Payer: Self-pay | Admitting: Registered Nurse

## 2020-01-21 ENCOUNTER — Encounter: Payer: Self-pay | Admitting: Physician Assistant

## 2020-01-21 ENCOUNTER — Telehealth: Payer: Self-pay | Admitting: Family Medicine

## 2020-01-21 VITALS — BP 130/60 | HR 68 | Temp 97.5°F | Resp 16

## 2020-01-21 DIAGNOSIS — R300 Dysuria: Secondary | ICD-10-CM | POA: Diagnosis not present

## 2020-01-21 DIAGNOSIS — I739 Peripheral vascular disease, unspecified: Secondary | ICD-10-CM | POA: Diagnosis not present

## 2020-01-21 DIAGNOSIS — N183 Chronic kidney disease, stage 3 unspecified: Secondary | ICD-10-CM | POA: Diagnosis not present

## 2020-01-21 DIAGNOSIS — Z4801 Encounter for change or removal of surgical wound dressing: Secondary | ICD-10-CM | POA: Diagnosis not present

## 2020-01-21 DIAGNOSIS — D631 Anemia in chronic kidney disease: Secondary | ICD-10-CM | POA: Diagnosis not present

## 2020-01-21 DIAGNOSIS — S2232XD Fracture of one rib, left side, subsequent encounter for fracture with routine healing: Secondary | ICD-10-CM | POA: Diagnosis not present

## 2020-01-21 DIAGNOSIS — I129 Hypertensive chronic kidney disease with stage 1 through stage 4 chronic kidney disease, or unspecified chronic kidney disease: Secondary | ICD-10-CM | POA: Diagnosis not present

## 2020-01-21 DIAGNOSIS — K5730 Diverticulosis of large intestine without perforation or abscess without bleeding: Secondary | ICD-10-CM | POA: Diagnosis not present

## 2020-01-21 DIAGNOSIS — S52502D Unspecified fracture of the lower end of left radius, subsequent encounter for closed fracture with routine healing: Secondary | ICD-10-CM | POA: Diagnosis not present

## 2020-01-21 DIAGNOSIS — Z48815 Encounter for surgical aftercare following surgery on the digestive system: Secondary | ICD-10-CM | POA: Diagnosis not present

## 2020-01-21 MED ORDER — SULFAMETHOXAZOLE-TRIMETHOPRIM 800-160 MG PO TABS: 1.0000 | ORAL_TABLET | Freq: Two times a day (BID) | ORAL | 0 refills | Status: DC

## 2020-01-21 NOTE — Telephone Encounter (Signed)
Pam from [facility] called in asking for verbal orders to treat pt for skilled nursing  1 wk 1, 2 wk 3, 1 wk 5. And 2 PRNs. They wanted to let Dr. Birdie Riddle know that pt has a broken arm, broken ribs, and she has an open wound that they are going to use the wet to dry treatment on.   Please call Melissa at [phone] with the verbal orders.

## 2020-01-21 NOTE — Telephone Encounter (Signed)
Pt daughter called back and was told PCP recommendations and stated an understanding. She is not with mom and said that when she went there this morning she was able to urinate though it was frequent and minimal. Pt daughter asked if [facility] nurse that is there now could bring a urine could we dip and send for culture if possible?

## 2020-01-21 NOTE — Telephone Encounter (Signed)
Called pt daughter and LMOVM to return call, so I can get answers for PCP questions.

## 2020-01-21 NOTE — Telephone Encounter (Signed)
Patient's daughter called in this morning saying that her mother got home from the hospital last night, but has noticed that ms.Aylesworth was up and down all night trying to go the bathroom, but is unable to use the bathroom. Jana Half also states she has some burning while urinating asked if Dr.Tabori could prescribe something to help.

## 2020-01-21 NOTE — Telephone Encounter (Signed)
Verbal ok given.

## 2020-01-21 NOTE — Telephone Encounter (Signed)
Ok for orders? 

## 2020-01-21 NOTE — Telephone Encounter (Signed)
Pt scheduled with [facility] today.  

## 2020-01-21 NOTE — Patient Instructions (Signed)
We will send an order for your [facility] nurse to collect a urine on Monday.   Your symptoms are consistent with a bladder infection, also called acute cystitis. Please take your antibiotic (Bactrim) as directed until all pills are gone.  Stay very well hydrated.  Consider a daily probiotic (Align, Culturelle, or Activia) to help prevent stomach upset caused by the antibiotic.  Taking a probiotic daily may also help prevent recurrent UTIs.  Also consider taking AZO (Phenazopyridine) tablets to help decrease pain with urination.  I will call you with your urine testing results.  We will change antibiotics if indicated.  Call or return to clinic if symptoms are not resolved by completion of antibiotic.   Urinary Tract Infection A urinary tract infection (UTI) can occur any place along the urinary tract. The tract includes the kidneys, ureters, bladder, and urethra. A type of germ called bacteria often causes a UTI. UTIs are often helped with antibiotic medicine.  HOME CARE   If given, take antibiotics as told by your doctor. Finish them even if you start to feel better.  Drink enough fluids to keep your pee (urine) clear or pale yellow.  Avoid tea, drinks with caffeine, and bubbly (carbonated) drinks.  Pee often. Avoid holding your pee in for a long time.  Pee before and after having sex (intercourse).  Wipe from front to back after you poop (bowel movement) if you are a woman. Use each tissue only once. GET HELP RIGHT AWAY IF:   You have back pain.  You have lower belly (abdominal) pain.  You have chills.  You feel sick to your stomach (nauseous).  You throw up (vomit).  Your burning or discomfort with peeing does not go away.  You have a fever.  Your symptoms are not better in 3 days. MAKE SURE YOU:   Understand these instructions.  Will watch your condition.  Will get help right away if you are not doing well or get worse. Document Released: 04/15/2008 Document Revised:  07/22/2012 Document Reviewed: 05/28/2012 [facility] Patient Information 2015 [place], [place]. This information is not intended to replace advice given to you by your health care provider. Make sure you discuss any questions you have with your health care provider.

## 2020-01-21 NOTE — Progress Notes (Signed)
Patient presents to clinic today with family c/o of 2 days of dysuria, urinary urgency and frequency with suprapubic pressure and urinary hesitancy.  Denies any noted hematuria.  Denies fever, chills, nausea or vomiting.  Has history of UTI.  Past Medical History:  Diagnosis Date  . DVT (deep venous thrombosis) ([place]) 09/2015   RLE  . GERD (gastroesophageal reflux disease)   . Hypertension   . Hypothyroidism   . 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 Harvel Quale  . Peripheral vascular disease ([place])    peripheral neuropathy    Current Outpatient Medications on File Prior to Visit  Medication Sig Dispense Refill  . acetaminophen (TYLENOL) 325 MG tablet Take 1-2 tablets (325-650 mg total) by mouth every 4 (four) hours as needed for mild pain.    Marland Kitchen amiodarone (PACERONE) 200 MG tablet Take 1 tablet (200 mg total) by mouth daily. 30 tablet 0  . apixaban (ELIQUIS) 5 MG TABS tablet Take 1 tablet (5 mg total) by mouth 2 (two) times daily. 60 tablet 0  . ascorbic acid (VITAMIN C) 250 MG tablet Take 1 tablet (250 mg total) by mouth 2 (two) times daily.    Marland Kitchen diltiazem (CARDIZEM CD) 120 MG 24 hr capsule Take 1 capsule (120 mg total) by mouth daily. 30 capsule 0  . docusate sodium (COLACE) 100 MG capsule Take 1 capsule (100 mg total) by mouth 2 (two) times daily. 60 capsule 0  . levothyroxine (SYNTHROID) 50 MCG tablet TAKE 1 TABLET BY MOUTH EVERY DAY (Patient taking differently: Take 50 mcg by mouth daily. ) 90 tablet 1  . metoprolol tartrate (LOPRESSOR) 25 MG tablet Take 1 tablet (25 mg total) by mouth 2 (two) times daily. 60 tablet 0  . Multiple Vitamins-Minerals (PRESERVISION AREDS 2) CAPS Take 1 capsule by mouth daily.     Marland Kitchen oxyCODONE (OXY IR/ROXICODONE) 5 MG immediate release tablet Take 0.5-1 tablets (2.5-5 mg total) by mouth 2 (two) times daily as needed for severe pain. 14 tablet 0  . pantoprazole (PROTONIX) 40 MG tablet TAKE 1 TABLET BY MOUTH EVERY DAY (Patient taking  differently: Take 40 mg by mouth daily. ) 90 tablet 1  . potassium chloride (KLOR-CON) 10 MEQ tablet Take 1 tablet (10 mEq total) by mouth 2 (two) times daily. 60 tablet 0  . predniSONE (DELTASONE) 5 MG tablet Take 10 mg by mouth daily with breakfast.     . saccharomyces boulardii (FLORASTOR) 250 MG capsule Take 1 capsule (250 mg total) by mouth 2 (two) times daily. 30 capsule 0  . senna-docusate (SENOKOT-S) 8.6-50 MG tablet Take 1 tablet by mouth at bedtime. 30 tablet 0  . vitamin A 10000 UNIT capsule Take 1 capsule (10,000 Units total) by mouth daily. 30 capsule 0   No current facility-administered medications on file prior to visit.    Allergies  Allergen Reactions  . Codeine Other (See Comments)    Nightmares, imagining things  . Keflex [Cephalexin] Nausea Only    Pt ended up in ER w/ CP    Family History  Problem Relation Age of Onset  . Cancer Mother        BREAST  . COPD Father   . Emphysema Father   . Cancer Daughter        breast    Social History   Socioeconomic History  . Marital status: Widowed    Spouse name: Not on file  . Number of children: Not on file  . Years of  education: Not on file  . Highest education level: Not on file  Occupational History  . Not on file  Tobacco Use  . Smoking status: Never Smoker  . Smokeless tobacco: Never Used  Substance and Sexual Activity  . Alcohol use: No  . Drug use: No  . Sexual activity: Never  Other Topics Concern  . Not on file  Social History Narrative  . Not on file   Social Determinants of Health   Financial Resource Strain:   . Difficulty of Paying Living Expenses:   Food Insecurity:   . Worried About Charity fundraiser in the Last Year:   . Arboriculturist in the Last Year:   Transportation Needs:   . Film/video editor (Medical):   Marland Kitchen Lack of Transportation (Non-Medical):   Physical Activity:   . Days of Exercise per Week:   . Minutes of Exercise per Session:   Stress:   . Feeling of Stress  :   Social Connections:   . Frequency of Communication with Friends and Family:   . Frequency of Social Gatherings with Friends and Family:   . Attends Religious Services:   . Active Member of Clubs or Organizations:   . Attends Archivist Meetings:   Marland Kitchen Marital Status:    Review of Systems - See HPI.  All other ROS are negative.  BP 130/60   Pulse 68   Temp (!) 97.5 F (36.4 C) (Temporal)   Resp 16   SpO2 98%   Physical Exam Vitals reviewed.  Constitutional:      Appearance: Normal appearance.  HENT:     Head: Normocephalic and atraumatic.  Cardiovascular:     Rate and Rhythm: Normal rate and regular rhythm.     Heart sounds: Normal heart sounds.  Pulmonary:     Effort: Pulmonary effort is normal.     Breath sounds: Normal breath sounds.  Abdominal:     General: Bowel sounds are normal. There is no distension.     Palpations: Abdomen is soft.     Tenderness: There is no abdominal tenderness. There is no right CVA tenderness. Left CVA tenderness: not tested due to rib fracture.  Musculoskeletal:     Cervical back: Neck supple.  Neurological:     Mental Status: She is alert.     Recent Results (from the past 2160 hour(s))  CBC with Differential     Status: Abnormal   Collection Time: 12/29/19  9:43 AM  Result Value Ref Range   WBC 15.1 (H) 4.0 - 10.5 K/uL   RBC 4.00 3.87 - 5.11 MIL/uL   Hemoglobin 12.2 12.0 - 15.0 g/dL   HCT 38.3 36.0 - 46.0 %   MCV 95.8 80.0 - 100.0 fL   MCH 30.5 26.0 - 34.0 pg   MCHC 31.9 30.0 - 36.0 g/dL   RDW 15.9 (H) 11.5 - 15.5 %   Platelets 251 150 - 400 K/uL   nRBC 0.0 0.0 - 0.2 %   Neutrophils Relative % 78 %   Neutro Abs 11.9 (H) 1.7 - 7.7 K/uL   Lymphocytes Relative 9 %   Lymphs Abs 1.4 0.7 - 4.0 K/uL   Monocytes Relative 11 %   Monocytes Absolute 1.7 (H) 0.1 - 1.0 K/uL   Eosinophils Relative 1 %   Eosinophils Absolute 0.1 0.0 - 0.5 K/uL   Basophils Relative 0 %   Basophils Absolute 0.0 0.0 - 0.1 K/uL   Immature  Granulocytes 1 %  Abs Immature Granulocytes 0.09 (H) 0.00 - 0.07 K/uL    Comment: Performed at [facility], [address] [place], [place] [address]  Basic metabolic panel     Status: Abnormal   Collection Time: 12/29/19  9:43 AM  Result Value Ref Range   Sodium 139 135 - 145 mmol/L   Potassium 3.7 3.5 - 5.1 mmol/L   Chloride 107 98 - 111 mmol/L   CO2 24 22 - 32 mmol/L   Glucose, Bld 119 (H) 70 - 99 mg/dL   BUN 31 (H) 8 - 23 mg/dL   Creatinine, Ser 1.64 (H) 0.44 - 1.00 mg/dL   Calcium 8.6 (L) 8.9 - 10.3 mg/dL   GFR calc non Af Amer 26 (L) >60 mL/min   GFR calc Af Amer 31 (L) >60 mL/min   Anion gap 8 5 - 15    Comment: Performed at [facility], [address] [place], [place] [address]  Respiratory Panel by RT PCR (Flu A&B, Covid) - Nasopharyngeal Swab     Status: None   Collection Time: 12/29/19 12:15 PM   Specimen: Nasopharyngeal Swab  Result Value Ref Range   SARS Coronavirus 2 by RT PCR NEGATIVE NEGATIVE    Comment: (NOTE) SARS-CoV-2 target nucleic acids are NOT DETECTED. The SARS-CoV-2 RNA is generally detectable in upper respiratoy specimens during the acute phase of infection. The lowest concentration of SARS-CoV-2 viral copies this assay can detect is 131 copies/mL. A negative result does not preclude SARS-Cov-2 infection and should not be used as the sole basis for treatment or other patient management decisions. A negative result may occur with  improper specimen collection/handling, submission of specimen other than nasopharyngeal swab, presence of viral mutation(s) within the areas targeted by this assay, and inadequate number of viral copies (<131 copies/mL). A negative result must be combined with clinical observations, patient history, and epidemiological information. The expected result is Negative. Fact Sheet for Patients:  PinkCheek.be Fact Sheet for Healthcare Providers:    GravelBags.it This test is not yet ap proved or cleared by the [place] FDA and  has been authorized for detection and/or diagnosis of SARS-CoV-2 by FDA under an Emergency Use Authorization (EUA). This EUA will remain  in effect (meaning this test can be used) for the duration of the COVID-19 declaration under Section 564(b)(1) of the Act, 21 U.S.C. section 360bbb-3(b)(1), unless the authorization is terminated or revoked sooner.    Influenza A by PCR NEGATIVE NEGATIVE   Influenza B by PCR NEGATIVE NEGATIVE    Comment: (NOTE) The Xpert Xpress SARS-CoV-2/FLU/RSV assay is intended as an aid in  the diagnosis of influenza from Nasopharyngeal swab specimens and  should not be used as a sole basis for treatment. Nasal washings and  aspirates are unacceptable for Xpert Xpress SARS-CoV-2/FLU/RSV  testing. Fact Sheet for Patients: PinkCheek.be Fact Sheet for Healthcare Providers: GravelBags.it This test is not yet approved or cleared by the [place] FDA and  has been authorized for detection and/or diagnosis of SARS-CoV-2 by  FDA under an Emergency Use Authorization (EUA). This EUA will remain  in effect (meaning this test can be used) for the duration of the  Covid-19 declaration under Section 564(b)(1) of the Act, 21  U.S.C. section 360bbb-3(b)(1), unless the authorization is  terminated or revoked. Performed at [facility], [address] [place], [place] [address]   Urinalysis, Routine w reflex microscopic     Status: None   Collection  Time: 12/29/19 12:44 PM  Result Value Ref Range   Color, Urine YELLOW YELLOW   APPearance CLEAR CLEAR   Specific Gravity, Urine 1.020 1.005 - 1.030   pH 5.5 5.0 - 8.0   Glucose, UA NEGATIVE NEGATIVE mg/dL   Hgb urine dipstick NEGATIVE NEGATIVE   Bilirubin Urine NEGATIVE NEGATIVE   Ketones, ur NEGATIVE NEGATIVE mg/dL   Protein, ur NEGATIVE  NEGATIVE mg/dL   Nitrite NEGATIVE NEGATIVE   Leukocytes,Ua NEGATIVE NEGATIVE    Comment: Microscopic not done on urines with negative protein, blood, leukocytes, nitrite, or glucose < 500 mg/dL. Performed at [facility], [address] [place], [place] [address]   Basic metabolic panel     Status: Abnormal   Collection Time: 12/30/19  4:48 AM  Result Value Ref Range   Sodium 140 135 - 145 mmol/L   Potassium 4.0 3.5 - 5.1 mmol/L   Chloride 108 98 - 111 mmol/L   CO2 24 22 - 32 mmol/L   Glucose, Bld 105 (H) 70 - 99 mg/dL   BUN 18 8 - 23 mg/dL   Creatinine, Ser 1.03 (H) 0.44 - 1.00 mg/dL   Calcium 8.4 (L) 8.9 - 10.3 mg/dL   GFR calc non Af Amer 46 (L) >60 mL/min   GFR calc Af Amer 54 (L) >60 mL/min   Anion gap 8 5 - 15    Comment: Performed at [facility], [place] [address] [place], [place] [address]  CBC WITH DIFFERENTIAL     Status: Abnormal   Collection Time: 12/30/19  4:48 AM  Result Value Ref Range   WBC 11.1 (H) 4.0 - 10.5 K/uL   RBC 3.69 (L) 3.87 - 5.11 MIL/uL   Hemoglobin 11.2 (L) 12.0 - 15.0 g/dL   HCT 35.6 (L) 36.0 - 46.0 %   MCV 96.5 80.0 - 100.0 fL   MCH 30.4 26.0 - 34.0 pg   MCHC 31.5 30.0 - 36.0 g/dL   RDW 15.9 (H) 11.5 - 15.5 %   Platelets 231 150 - 400 K/uL   nRBC 0.0 0.0 - 0.2 %   Neutrophils Relative % 70 %   Neutro Abs 7.7 1.7 - 7.7 K/uL   Lymphocytes Relative 17 %   Lymphs Abs 1.9 0.7 - 4.0 K/uL   Monocytes Relative 11 %   Monocytes Absolute 1.3 (H) 0.1 - 1.0 K/uL   Eosinophils Relative 1 %   Eosinophils Absolute 0.2 0.0 - 0.5 K/uL   Basophils Relative 0 %   Basophils Absolute 0.0 0.0 - 0.1 K/uL   Immature Granulocytes 1 %   Abs Immature Granulocytes 0.05 0.00 - 0.07 K/uL    Comment: Performed at [facility], [address], [place], [place] [address]  ECHOCARDIOGRAM COMPLETE     Status: None   Collection Time: 12/30/19 12:41 PM  Result Value Ref Range   Weight 2,704 oz   Height 66 in   BP 93/52 mmHg  CBC     Status:  Abnormal   Collection Time: 12/31/19  4:15 AM  Result Value Ref Range   WBC 10.9 (H) 4.0 - 10.5 K/uL   RBC 3.70 (L) 3.87 - 5.11 MIL/uL   Hemoglobin 11.3 (L) 12.0 - 15.0 g/dL   HCT 35.9 (L) 36.0 - 46.0 %   MCV 97.0 80.0 - 100.0 fL   MCH 30.5 26.0 - 34.0 pg   MCHC 31.5 30.0 - 36.0 g/dL   RDW 15.8 (H) 11.5 - 15.5 %  Platelets 233 150 - 400 K/uL   nRBC 0.0 0.0 - 0.2 %    Comment: Performed at [facility], [place] [address], [place], [place] [address]  Basic metabolic panel     Status: Abnormal   Collection Time: 12/31/19  4:15 AM  Result Value Ref Range   Sodium 141 135 - 145 mmol/L   Potassium 4.2 3.5 - 5.1 mmol/L   Chloride 106 98 - 111 mmol/L   CO2 27 22 - 32 mmol/L   Glucose, Bld 125 (H) 70 - 99 mg/dL   BUN 12 8 - 23 mg/dL   Creatinine, Ser 0.86 0.44 - 1.00 mg/dL   Calcium 9.0 8.9 - 10.3 mg/dL   GFR calc non Af Amer 58 (L) >60 mL/min   GFR calc Af Amer >60 >60 mL/min   Anion gap 8 5 - 15    Comment: Performed at [facility] [address], [place], [place] [address]  Surgical pcr screen     Status: None   Collection Time: 12/31/19  7:49 PM   Specimen: Nasal Mucosa; Nasal Swab  Result Value Ref Range   MRSA, PCR NEGATIVE NEGATIVE   Staphylococcus aureus NEGATIVE NEGATIVE    Comment: (NOTE) The Xpert SA Assay (FDA approved for NASAL specimens in patients 82 years of age and older), is one component of a comprehensive surveillance program. It is not intended to diagnose infection nor to guide or monitor treatment. Performed at [facility], [place] [address], [place], [place] [address]   Glucose, capillary     Status: None   Collection Time: 01/01/20  6:50 AM  Result Value Ref Range   Glucose-Capillary 87 70 - 99 mg/dL  Hemoglobin and hematocrit, blood     Status: Abnormal   Collection Time: 01/02/20  4:10 AM  Result Value Ref Range   Hemoglobin 10.7 (L) 12.0 - 15.0 g/dL   HCT 32.9 (L) 36.0 - 46.0 %    Comment: Performed at [facility],  [place] [address], [place], [place] [address]  Basic metabolic panel     Status: Abnormal   Collection Time: 01/02/20  4:10 AM  Result Value Ref Range   Sodium 138 135 - 145 mmol/L   Potassium 4.1 3.5 - 5.1 mmol/L   Chloride 103 98 - 111 mmol/L   CO2 27 22 - 32 mmol/L   Glucose, Bld 113 (H) 70 - 99 mg/dL   BUN 14 8 - 23 mg/dL   Creatinine, Ser 0.81 0.44 - 1.00 mg/dL   Calcium 8.7 (L) 8.9 - 10.3 mg/dL   GFR calc non Af Amer >60 >60 mL/min   GFR calc Af Amer >60 >60 mL/min   Anion gap 8 5 - 15    Comment: Performed at [facility] [address] [place], [place] [address]  Hemoglobin and hematocrit, blood     Status: Abnormal   Collection Time: 01/03/20  4:12 AM  Result Value Ref Range   Hemoglobin 10.8 (L) 12.0 - 15.0 g/dL   HCT 34.2 (L) 36.0 - 46.0 %    Comment: Performed at [facility], [place] [address] [place], [place] [address]  Basic metabolic panel     Status: Abnormal   Collection Time: 01/03/20  4:12 AM  Result Value Ref Range   Sodium 139 135 - 145 mmol/L   Potassium 3.8 3.5 - 5.1 mmol/L   Chloride 102 98 - 111 mmol/L   CO2 27  22 - 32 mmol/L   Glucose, Bld 108 (H) 70 - 99 mg/dL   BUN 17 8 - 23 mg/dL   Creatinine, Ser 0.92 0.44 - 1.00 mg/dL   Calcium 8.8 (L) 8.9 - 10.3 mg/dL   GFR calc non Af Amer 53 (L) >60 mL/min   GFR calc Af Amer >60 >60 mL/min   Anion gap 10 5 - 15    Comment: Performed at [facility] [address] [place], [place] [address]  Hemoglobin and hematocrit, blood     Status: Abnormal   Collection Time: 01/04/20  5:09 AM  Result Value Ref Range   Hemoglobin 10.8 (L) 12.0 - 15.0 g/dL   HCT 33.6 (L) 36.0 - 46.0 %    Comment: Performed at [facility] [address], [place], [place] [address]  Urinalysis, Routine w reflex microscopic     Status: Abnormal   Collection Time: 01/05/20  6:21 AM  Result Value Ref Range   Color, Urine YELLOW YELLOW   APPearance HAZY (A) CLEAR   Specific Gravity, Urine 1.014 1.005 - 1.030   pH 6.0  5.0 - 8.0   Glucose, UA NEGATIVE NEGATIVE mg/dL   Hgb urine dipstick NEGATIVE NEGATIVE   Bilirubin Urine NEGATIVE NEGATIVE   Ketones, ur NEGATIVE NEGATIVE mg/dL   Protein, ur NEGATIVE NEGATIVE mg/dL   Nitrite NEGATIVE NEGATIVE   Leukocytes,Ua NEGATIVE NEGATIVE    Comment: Performed at [facility] [address], [place], [place] [address]  CBC with Differential/Platelet     Status: Abnormal   Collection Time: 01/06/20  4:54 AM  Result Value Ref Range   WBC 14.2 (H) 4.0 - 10.5 K/uL   RBC 3.44 (L) 3.87 - 5.11 MIL/uL   Hemoglobin 10.3 (L) 12.0 - 15.0 g/dL   HCT 32.6 (L) 36.0 - 46.0 %   MCV 94.8 80.0 - 100.0 fL   MCH 29.9 26.0 - 34.0 pg   MCHC 31.6 30.0 - 36.0 g/dL   RDW 15.4 11.5 - 15.5 %   Platelets 308 150 - 400 K/uL   nRBC 0.0 0.0 - 0.2 %   Neutrophils Relative % 86 %   Neutro Abs 12.3 (H) 1.7 - 7.7 K/uL   Lymphocytes Relative 10 %   Lymphs Abs 1.4 0.7 - 4.0 K/uL   Monocytes Relative 3 %   Monocytes Absolute 0.4 0.1 - 1.0 K/uL   Eosinophils Relative 0 %   Eosinophils Absolute 0.0 0.0 - 0.5 K/uL   Basophils Relative 0 %   Basophils Absolute 0.0 0.0 - 0.1 K/uL   Immature Granulocytes 1 %   Abs Immature Granulocytes 0.07 0.00 - 0.07 K/uL    Comment: Performed at [facility], [place] [address] [place],  [address]  Comprehensive metabolic panel     Status: Abnormal   Collection Time: 01/06/20  4:54 AM  Result Value Ref Range   Sodium 139 135 - 145 mmol/L   Potassium 4.3 3.5 - 5.1 mmol/L   Chloride 102 98 - 111 mmol/L   CO2 27 22 - 32 mmol/L   Glucose, Bld 104 (H) 70 - 99 mg/dL    Comment: Glucose reference range applies only to samples taken after fasting for at least 8 hours.   BUN 21 8 - 23 mg/dL   Creatinine, Ser 1.44 (H) 0.44 - 1.00 mg/dL   Calcium 8.4 (L) 8.9 - 10.3 mg/dL   Total Protein 4.9 (L) 6.5 - 8.1 g/dL   Albumin  2.1 (L) 3.5 - 5.0 g/dL   AST 14 (L) 15 - 41 U/L   ALT 13 0 - 44 U/L   Alkaline Phosphatase 64 38 - 126 U/L   Total Bilirubin  1.6 (H) 0.3 - 1.2 mg/dL   GFR calc non Af Amer 31 (L) >60 mL/min   GFR calc Af Amer 36 (L) >60 mL/min   Anion gap 10 5 - 15    Comment: Performed at [facility] [address], [place], [place] [address]  Protime-INR     Status: None   Collection Time: 01/06/20  4:54 AM  Result Value Ref Range   Prothrombin Time 15.2 11.4 - 15.2 seconds   INR 1.2 0.8 - 1.2    Comment: (NOTE) INR goal varies based on device and disease states. Performed at [facility], [place] [address], [place], [place] [address]   Type and screen [place]     Status: None   Collection Time: 01/06/20  8:14 AM  Result Value Ref Range   ABO/RH(D) O POS    Antibody Screen NEG    Sample Expiration      01/09/2020,2359 Performed at [facility], [place] [address], [place], [place] [address]   ABO/Rh     Status: None   Collection Time: 01/06/20  8:14 AM  Result Value Ref Range   ABO/RH(D)      O POS Performed at [facility] [address] [place], [place] [address]   Surgical pathology     Status: None   Collection Time: 01/06/20  1:01 PM  Result Value Ref Range   SURGICAL PATHOLOGY      SURGICAL PATHOLOGY CASE: [id number] PATIENT: Telia Johns Surgical Pathology Report     Clinical History: Perforated diverticulitis (cm)     FINAL MICROSCOPIC DIAGNOSIS:  A. COLON, SIGMOID, RESECTION: - Diverticulosis with acute diverticulitis.  Abscess.  Transmural defect.   GROSS DESCRIPTION:  The specimen is received fresh, labeled sigmoid colon and consists of a segment of colon with two stapled resection margins measuring 14.5 cm in length.  Per the requisition, a suture designates the proximal margin. The serosa is tan and smooth, with attached tan-yellow to brown adipose tissue.  The adipose tissue displays a moderate amount of tan, adherent purulent material.  The lumen is filled with an abundant amount of brown, firm fecal material.  The mucosa is  tan-pink, with the normal folding and multiple enlarged diverticula.  A transmural defect is identified near the proximal end of the specimen, approximately 3.0 cm from the proximal margin.  The wal l ranges from 0.3 to 0.7 cm in thickness.  No enlarged lymph nodes are grossly identified. Representative sections are submitted in 5 cassettes. 1 = proximal margin. 2 = distal margin. 3 = transmural defect. 4-5 = diverticula (KL 01/10/2020).    Final Diagnosis performed by Gillie Manners, MD.   Electronically signed 01/10/2020 Technical and / or Professional components performed at [facility]. [facility], [place] [address] [place], [place] [address].  Immunohistochemistry Technical component (if applicable) was performed at [facility]. [address] [place], [place], [place] [address].   IMMUNOHISTOCHEMISTRY DISCLAIMER (if applicable): Some of these immunohistochemical stains may have been developed and the performance characteristics determine by [facility]. Some may not have been cleared or approved by the U.S. Food and Drug Administration. The FDA has determined that such clearance or approval is not necessary. This  test  is used for clinical purposes. It should not be regarded as investigational or for research. This laboratory is certified under the [place] (CLIA-88) as qualified to perform high complexity clinical laboratory testing.  The controls stained appropriately.   Glucose, capillary     Status: None   Collection Time: 01/06/20  3:35 PM  Result Value Ref Range   Glucose-Capillary 87 70 - 99 mg/dL    Comment: Glucose reference range applies only to samples taken after fasting for at least 8 hours.  Glucose, capillary     Status: None   Collection Time: 01/06/20  7:11 PM  Result Value Ref Range   Glucose-Capillary 88 70 - 99 mg/dL    Comment: Glucose reference range applies only to samples  taken after fasting for at least 8 hours.  CBC with Differential/Platelet     Status: Abnormal   Collection Time: 01/07/20  2:02 AM  Result Value Ref Range   WBC 13.8 (H) 4.0 - 10.5 K/uL   RBC 3.07 (L) 3.87 - 5.11 MIL/uL   Hemoglobin 9.3 (L) 12.0 - 15.0 g/dL   HCT 29.3 (L) 36.0 - 46.0 %   MCV 95.4 80.0 - 100.0 fL   MCH 30.3 26.0 - 34.0 pg   MCHC 31.7 30.0 - 36.0 g/dL   RDW 15.7 (H) 11.5 - 15.5 %   Platelets 305 150 - 400 K/uL   nRBC 0.0 0.0 - 0.2 %   Neutrophils Relative % 89 %   Neutro Abs 12.3 (H) 1.7 - 7.7 K/uL   Lymphocytes Relative 6 %   Lymphs Abs 0.8 0.7 - 4.0 K/uL   Monocytes Relative 4 %   Monocytes Absolute 0.5 0.1 - 1.0 K/uL   Eosinophils Relative 0 %   Eosinophils Absolute 0.0 0.0 - 0.5 K/uL   Basophils Relative 0 %   Basophils Absolute 0.0 0.0 - 0.1 K/uL   Immature Granulocytes 1 %   Abs Immature Granulocytes 0.12 (H) 0.00 - 0.07 K/uL    Comment: Performed at [facility], [address], [place], [place] [address]  Basic metabolic panel     Status: Abnormal   Collection Time: 01/07/20  2:02 AM  Result Value Ref Range   Sodium 141 135 - 145 mmol/L   Potassium 4.1 3.5 - 5.1 mmol/L   Chloride 107 98 - 111 mmol/L   CO2 22 22 - 32 mmol/L   Glucose, Bld 89 70 - 99 mg/dL    Comment: Glucose reference range applies only to samples taken after fasting for at least 8 hours.   BUN 18 8 - 23 mg/dL   Creatinine, Ser 1.06 (H) 0.44 - 1.00 mg/dL   Calcium 8.1 (L) 8.9 - 10.3 mg/dL   GFR calc non Af Amer 45 (L) >60 mL/min   GFR calc Af Amer 52 (L) >60 mL/min   Anion gap 12 5 - 15    Comment: Performed at [facility] [address], [place], [place] [address]  Lactic acid, plasma     Status: None   Collection Time: 01/07/20  2:02 AM  Result Value Ref Range   Lactic Acid, Venous 0.8 0.5 - 1.9 mmol/L    Comment: Performed at [facility] [address], [place], [place] [address]  TSH     Status: Abnormal   Collection Time: 01/07/20 10:36 AM  Result  Value Ref Range   TSH 0.187 (L) 0.350 - 4.500 uIU/mL  Comment: Performed by a 3rd Generation assay with a functional sensitivity of <=0.01 uIU/mL. Performed at [facility], [place] [address], [place], [place] [address]   CBC with Differential/Platelet     Status: Abnormal   Collection Time: 01/08/20  5:53 AM  Result Value Ref Range   WBC 16.1 (H) 4.0 - 10.5 K/uL   RBC 3.06 (L) 3.87 - 5.11 MIL/uL   Hemoglobin 9.2 (L) 12.0 - 15.0 g/dL   HCT 29.3 (L) 36.0 - 46.0 %   MCV 95.8 80.0 - 100.0 fL   MCH 30.1 26.0 - 34.0 pg   MCHC 31.4 30.0 - 36.0 g/dL   RDW 15.7 (H) 11.5 - 15.5 %   Platelets 336 150 - 400 K/uL   nRBC 0.0 0.0 - 0.2 %   Neutrophils Relative % 84 %   Neutro Abs 13.5 (H) 1.7 - 7.7 K/uL   Lymphocytes Relative 7 %   Lymphs Abs 1.1 0.7 - 4.0 K/uL   Monocytes Relative 6 %   Monocytes Absolute 1.0 0.1 - 1.0 K/uL   Eosinophils Relative 0 %   Eosinophils Absolute 0.0 0.0 - 0.5 K/uL   Basophils Relative 0 %   Basophils Absolute 0.0 0.0 - 0.1 K/uL   Immature Granulocytes 3 %   Abs Immature Granulocytes 0.46 (H) 0.00 - 0.07 K/uL    Comment: Performed at [facility] [address], [place] [place], [place] [address]  Basic metabolic panel     Status: Abnormal   Collection Time: 01/08/20  5:53 AM  Result Value Ref Range   Sodium 142 135 - 145 mmol/L   Potassium 3.7 3.5 - 5.1 mmol/L   Chloride 110 98 - 111 mmol/L   CO2 23 22 - 32 mmol/L   Glucose, Bld 109 (H) 70 - 99 mg/dL    Comment: Glucose reference range applies only to samples taken after fasting for at least 8 hours.   BUN 22 8 - 23 mg/dL   Creatinine, Ser 0.95 0.44 - 1.00 mg/dL   Calcium 8.6 (L) 8.9 - 10.3 mg/dL   GFR calc non Af Amer 51 (L) >60 mL/min   GFR calc Af Amer 59 (L) >60 mL/min   Anion gap 9 5 - 15    Comment: Performed at [facility] [address], [place], [place] [address]  T3, free     Status: Abnormal   Collection Time: 01/08/20  5:53 AM  Result Value Ref Range   T3, Free 1.1 (L) 2.0 - 4.4  pg/mL    Comment: (NOTE) Performed At: [facility] [place], [place] [id number] Rush Farmer MD SJ:[id number]   T4, free     Status: Abnormal   Collection Time: 01/08/20  5:53 AM  Result Value Ref Range   Free T4 1.69 (H) 0.61 - 1.12 ng/dL    Comment: (NOTE) Biotin ingestion may interfere with free T4 tests. If the results are inconsistent with the TSH level, previous test results, or the clinical presentation, then consider biotin interference. If needed, order repeat testing after stopping biotin. Performed at [facility], [place] [address], [place], [place] [address]   Heparin level (unfractionated)     Status: Abnormal   Collection Time: 01/08/20  5:53 AM  Result Value Ref Range   Heparin Unfractionated 0.21 (L) 0.30 - 0.70 IU/mL    Comment: (NOTE) If heparin results are below expected values, and patient dosage has  been confirmed, suggest follow up testing  of antithrombin III levels. Performed at [facility], [place] [address], [place], [place] [address]   Glucose, capillary     Status: None   Collection Time: 01/08/20  6:24 AM  Result Value Ref Range   Glucose-Capillary 96 70 - 99 mg/dL    Comment: Glucose reference range applies only to samples taken after fasting for at least 8 hours.  Culture, blood (routine x 2)     Status: None   Collection Time: 01/08/20  8:11 AM   Specimen: BLOOD LEFT HAND  Result Value Ref Range   Specimen Description BLOOD LEFT HAND    Special Requests      BOTTLES DRAWN AEROBIC ONLY Blood Culture results may not be optimal due to an inadequate volume of blood received in culture bottles   Culture      NO GROWTH 5 DAYS Performed at [facility], [place] [address] [place], [place] [address]    Report Status 01/13/2020 FINAL   Culture, blood (routine x 2)     Status: None   Collection Time: 01/08/20  8:12 AM   Specimen: BLOOD LEFT FOREARM  Result Value Ref Range   Specimen Description BLOOD LEFT  FOREARM    Special Requests      BOTTLES DRAWN AEROBIC ONLY Blood Culture results may not be optimal due to an inadequate volume of blood received in culture bottles   Culture      NO GROWTH 5 DAYS Performed at [facility], [place] [address] [place], [place] [address]    Report Status 01/13/2020 FINAL   Heparin level (unfractionated)     Status: None   Collection Time: 01/08/20  2:14 PM  Result Value Ref Range   Heparin Unfractionated 0.43 0.30 - 0.70 IU/mL    Comment: (NOTE) If heparin results are below expected values, and patient dosage has  been confirmed, suggest follow up testing of antithrombin III levels. Performed at [facility], [place] [address], [place], [place] [address]   Glucose, capillary     Status: Abnormal   Collection Time: 01/08/20  8:14 PM  Result Value Ref Range   Glucose-Capillary 121 (H) 70 - 99 mg/dL    Comment: Glucose reference range applies only to samples taken after fasting for at least 8 hours.  Heparin level (unfractionated)     Status: None   Collection Time: 01/09/20  3:36 AM  Result Value Ref Range   Heparin Unfractionated 0.34 0.30 - 0.70 IU/mL    Comment: (NOTE) If heparin results are below expected values, and patient dosage has  been confirmed, suggest follow up testing of antithrombin III levels. Performed at [facility], [place] [address], [place], [place] [address]   CBC with Differential/Platelet     Status: Abnormal   Collection Time: 01/09/20  5:31 AM  Result Value Ref Range   WBC 13.6 (H) 4.0 - 10.5 K/uL   RBC 3.26 (L) 3.87 - 5.11 MIL/uL   Hemoglobin 9.8 (L) 12.0 - 15.0 g/dL   HCT 31.8 (L) 36.0 - 46.0 %   MCV 97.5 80.0 - 100.0 fL   MCH 30.1 26.0 - 34.0 pg   MCHC 30.8 30.0 - 36.0 g/dL   RDW 15.3 11.5 - 15.5 %   Platelets 406 (H) 150 - 400 K/uL   nRBC 0.0 0.0 - 0.2 %   Neutrophils Relative % 68 %   Neutro Abs 9.2 (H) 1.7 - 7.7 K/uL   Lymphocytes Relative 14 %  Lymphs Abs 1.9 0.7 - 4.0 K/uL   Monocytes  Relative 9 %   Monocytes Absolute 1.3 (H) 0.1 - 1.0 K/uL   Eosinophils Relative 1 %   Eosinophils Absolute 0.2 0.0 - 0.5 K/uL   Basophils Relative 1 %   Basophils Absolute 0.1 0.0 - 0.1 K/uL   WBC Morphology MILD LEFT SHIFT (1-5% METAS, OCC MYELO, OCC BANDS)    Immature Granulocytes 7 %   Abs Immature Granulocytes 0.91 (H) 0.00 - 0.07 K/uL    Comment: Performed at [facility] [address], [place], [place] [address]  Heparin level (unfractionated)     Status: None   Collection Time: 01/10/20  2:21 AM  Result Value Ref Range   Heparin Unfractionated 0.31 0.30 - 0.70 IU/mL    Comment: (NOTE) If heparin results are below expected values, and patient dosage has  been confirmed, suggest follow up testing of antithrombin III levels. Performed at [facility], [place] [address] [place], [place] [address]   CBC with Differential/Platelet     Status: Abnormal   Collection Time: 01/10/20  2:21 AM  Result Value Ref Range   WBC 13.2 (H) 4.0 - 10.5 K/uL   RBC 3.12 (L) 3.87 - 5.11 MIL/uL   Hemoglobin 9.4 (L) 12.0 - 15.0 g/dL   HCT 29.7 (L) 36.0 - 46.0 %   MCV 95.2 80.0 - 100.0 fL   MCH 30.1 26.0 - 34.0 pg   MCHC 31.6 30.0 - 36.0 g/dL   RDW 14.9 11.5 - 15.5 %   Platelets 421 (H) 150 - 400 K/uL   nRBC 0.0 0.0 - 0.2 %   Neutrophils Relative % 61 %   Neutro Abs 8.1 (H) 1.7 - 7.7 K/uL   Lymphocytes Relative 17 %   Lymphs Abs 2.2 0.7 - 4.0 K/uL   Monocytes Relative 9 %   Monocytes Absolute 1.2 (H) 0.1 - 1.0 K/uL   Eosinophils Relative 1 %   Eosinophils Absolute 0.1 0.0 - 0.5 K/uL   Basophils Relative 1 %   Basophils Absolute 0.1 0.0 - 0.1 K/uL   WBC Morphology DOHLE BODIES     Comment: MILD LEFT SHIFT (1-5% METAS, OCC MYELO, OCC BANDS)   Immature Granulocytes 11 %   Abs Immature Granulocytes 1.45 (H) 0.00 - 0.07 K/uL   Polychromasia PRESENT     Comment: Performed at [facility], [place] [address], [place],  [address]  Basic metabolic panel     Status: Abnormal    Collection Time: 01/10/20  2:21 AM  Result Value Ref Range   Sodium 140 135 - 145 mmol/L   Potassium 3.7 3.5 - 5.1 mmol/L   Chloride 103 98 - 111 mmol/L   CO2 25 22 - 32 mmol/L   Glucose, Bld 87 70 - 99 mg/dL    Comment: Glucose reference range applies only to samples taken after fasting for at least 8 hours.   BUN 14 8 - 23 mg/dL   Creatinine, Ser 0.72 0.44 - 1.00 mg/dL   Calcium 8.5 (L) 8.9 - 10.3 mg/dL   GFR calc non Af Amer >60 >60 mL/min   GFR calc Af Amer >60 >60 mL/min   Anion gap 12 5 - 15    Comment: Performed at [facility] [address], [place], [place] [address]  Heparin level (unfractionated)     Status: None   Collection Time: 01/11/20  4:16 AM  Result Value Ref Range   Heparin  Unfractionated 0.57 0.30 - 0.70 IU/mL    Comment: (NOTE) If heparin results are below expected values, and patient dosage has  been confirmed, suggest follow up testing of antithrombin III levels. Performed at [facility], [place] [address], [place], [place] [address]   CBC     Status: Abnormal   Collection Time: 01/11/20 11:39 AM  Result Value Ref Range   WBC 20.3 (H) 4.0 - 10.5 K/uL   RBC 3.63 (L) 3.87 - 5.11 MIL/uL   Hemoglobin 10.9 (L) 12.0 - 15.0 g/dL   HCT 34.5 (L) 36.0 - 46.0 %   MCV 95.0 80.0 - 100.0 fL   MCH 30.0 26.0 - 34.0 pg   MCHC 31.6 30.0 - 36.0 g/dL   RDW 14.9 11.5 - 15.5 %   Platelets 556 (H) 150 - 400 K/uL   nRBC 0.1 0.0 - 0.2 %    Comment: Performed at [facility], [place] [address] [place], [place] [address]  CBC with Differential/Platelet     Status: Abnormal   Collection Time: 01/12/20  5:27 AM  Result Value Ref Range   WBC 17.0 (H) 4.0 - 10.5 K/uL   RBC 3.43 (L) 3.87 - 5.11 MIL/uL   Hemoglobin 10.3 (L) 12.0 - 15.0 g/dL   HCT 32.9 (L) 36.0 - 46.0 %   MCV 95.9 80.0 - 100.0 fL   MCH 30.0 26.0 - 34.0 pg   MCHC 31.3 30.0 - 36.0 g/dL   RDW 15.3 11.5 - 15.5 %   Platelets 556 (H) 150 - 400 K/uL   nRBC 0.2 0.0 - 0.2 %   Neutrophils Relative % 65  %   Neutro Abs 11.1 (H) 1.7 - 7.7 K/uL   Lymphocytes Relative 18 %   Lymphs Abs 3.0 0.7 - 4.0 K/uL   Monocytes Relative 6 %   Monocytes Absolute 1.1 (H) 0.1 - 1.0 K/uL   Eosinophils Relative 2 %   Eosinophils Absolute 0.3 0.0 - 0.5 K/uL   Basophils Relative 1 %   Basophils Absolute 0.1 0.0 - 0.1 K/uL   Immature Granulocytes 8 %   Abs Immature Granulocytes 1.43 (H) 0.00 - 0.07 K/uL    Comment: Performed at [facility] [address], [place], [place] [address]  Basic metabolic panel     Status: Abnormal   Collection Time: 01/12/20  5:27 AM  Result Value Ref Range   Sodium 140 135 - 145 mmol/L   Potassium 3.5 3.5 - 5.1 mmol/L   Chloride 104 98 - 111 mmol/L   CO2 25 22 - 32 mmol/L   Glucose, Bld 84 70 - 99 mg/dL    Comment: Glucose reference range applies only to samples taken after fasting for at least 8 hours.   BUN 11 8 - 23 mg/dL   Creatinine, Ser 0.88 0.44 - 1.00 mg/dL   Calcium 8.7 (L) 8.9 - 10.3 mg/dL   GFR calc non Af Amer 56 (L) >60 mL/min   GFR calc Af Amer >60 >60 mL/min   Anion gap 11 5 - 15    Comment: Performed at [facility] [address], [place], [place] [address]  Basic metabolic panel     Status: Abnormal   Collection Time: 01/13/20  1:06 AM  Result Value Ref Range   Sodium 142 135 - 145 mmol/L   Potassium 3.7 3.5 - 5.1 mmol/L   Chloride 106 98 - 111 mmol/L   CO2 25 22 - 32 mmol/L  Glucose, Bld 92 70 - 99 mg/dL    Comment: Glucose reference range applies only to samples taken after fasting for at least 8 hours.   BUN 10 8 - 23 mg/dL   Creatinine, Ser 0.83 0.44 - 1.00 mg/dL   Calcium 8.6 (L) 8.9 - 10.3 mg/dL   GFR calc non Af Amer >60 >60 mL/min   GFR calc Af Amer >60 >60 mL/min   Anion gap 11 5 - 15    Comment: Performed at [facility] [address], [place], [place] [address]  CBC     Status: Abnormal   Collection Time: 01/13/20  1:06 AM  Result Value Ref Range   WBC 12.8 (H) 4.0 - 10.5 K/uL   RBC 3.51 (L) 3.87 - 5.11 MIL/uL    Hemoglobin 10.6 (L) 12.0 - 15.0 g/dL   HCT 33.0 (L) 36.0 - 46.0 %   MCV 94.0 80.0 - 100.0 fL   MCH 30.2 26.0 - 34.0 pg   MCHC 32.1 30.0 - 36.0 g/dL   RDW 15.5 11.5 - 15.5 %   Platelets 567 (H) 150 - 400 K/uL   nRBC 0.0 0.0 - 0.2 %    Comment: Performed at [facility], [place] [address], [place], [place] [address]  Magnesium     Status: None   Collection Time: 01/13/20  1:06 AM  Result Value Ref Range   Magnesium 1.9 1.7 - 2.4 mg/dL    Comment: Performed at [facility] [address], [place], [place] [address]  Basic metabolic panel     Status: Abnormal   Collection Time: 01/14/20  1:00 AM  Result Value Ref Range   Sodium 141 135 - 145 mmol/L   Potassium 3.8 3.5 - 5.1 mmol/L   Chloride 106 98 - 111 mmol/L   CO2 23 22 - 32 mmol/L   Glucose, Bld 91 70 - 99 mg/dL    Comment: Glucose reference range applies only to samples taken after fasting for at least 8 hours.   BUN 11 8 - 23 mg/dL   Creatinine, Ser 1.05 (H) 0.44 - 1.00 mg/dL   Calcium 8.8 (L) 8.9 - 10.3 mg/dL   GFR calc non Af Amer 45 (L) >60 mL/min   GFR calc Af Amer 53 (L) >60 mL/min   Anion gap 12 5 - 15    Comment: Performed at [facility] [address] [place], [place] [address]  CBC     Status: Abnormal   Collection Time: 01/14/20  1:00 AM  Result Value Ref Range   WBC 12.3 (H) 4.0 - 10.5 K/uL   RBC 3.63 (L) 3.87 - 5.11 MIL/uL   Hemoglobin 10.9 (L) 12.0 - 15.0 g/dL   HCT 34.1 (L) 36.0 - 46.0 %   MCV 93.9 80.0 - 100.0 fL   MCH 30.0 26.0 - 34.0 pg   MCHC 32.0 30.0 - 36.0 g/dL   RDW 15.5 11.5 - 15.5 %   Platelets 609 (H) 150 - 400 K/uL   nRBC 0.0 0.0 - 0.2 %    Comment: Performed at [facility] [address], [place], [place] [address]  Magnesium     Status: None   Collection Time: 01/14/20  1:00 AM  Result Value Ref Range   Magnesium 2.0 1.7 - 2.4 mg/dL    Comment: Performed at [facility], [place] [address], [place], [place] [address]  CBC WITH DIFFERENTIAL     Status:  Abnormal   Collection Time: 01/15/20  5:41 AM  Result Value Ref Range   WBC 12.1 (H) 4.0 - 10.5 K/uL   RBC 3.61 (L) 3.87 - 5.11 MIL/uL   Hemoglobin 11.0 (L) 12.0 - 15.0 g/dL   HCT 34.6 (L) 36.0 - 46.0 %   MCV 95.8 80.0 - 100.0 fL   MCH 30.5 26.0 - 34.0 pg   MCHC 31.8 30.0 - 36.0 g/dL   RDW 15.6 (H) 11.5 - 15.5 %   Platelets 641 (H) 150 - 400 K/uL   nRBC 0.0 0.0 - 0.2 %   Neutrophils Relative % 72 %   Neutro Abs 8.6 (H) 1.7 - 7.7 K/uL   Lymphocytes Relative 17 %   Lymphs Abs 2.0 0.7 - 4.0 K/uL   Monocytes Relative 8 %   Monocytes Absolute 1.0 0.1 - 1.0 K/uL   Eosinophils Relative 1 %   Eosinophils Absolute 0.2 0.0 - 0.5 K/uL   Basophils Relative 0 %   Basophils Absolute 0.0 0.0 - 0.1 K/uL   Immature Granulocytes 2 %   Abs Immature Granulocytes 0.23 (H) 0.00 - 0.07 K/uL    Comment: Performed at [facility], [address] [place], [place] [address]  Comprehensive metabolic panel     Status: Abnormal   Collection Time: 01/15/20  5:41 AM  Result Value Ref Range   Sodium 143 135 - 145 mmol/L   Potassium 3.5 3.5 - 5.1 mmol/L   Chloride 104 98 - 111 mmol/L   CO2 26 22 - 32 mmol/L   Glucose, Bld 99 70 - 99 mg/dL    Comment: Glucose reference range applies only to samples taken after fasting for at least 8 hours.   BUN 10 8 - 23 mg/dL   Creatinine, Ser 0.93 0.44 - 1.00 mg/dL   Calcium 8.8 (L) 8.9 - 10.3 mg/dL   Total Protein 5.7 (L) 6.5 - 8.1 g/dL   Albumin 2.5 (L) 3.5 - 5.0 g/dL   AST 17 15 - 41 U/L   ALT 23 0 - 44 U/L   Alkaline Phosphatase 137 (H) 38 - 126 U/L   Total Bilirubin 1.0 0.3 - 1.2 mg/dL   GFR calc non Af Amer 53 (L) >60 mL/min   GFR calc Af Amer >60 >60 mL/min   Anion gap 13 5 - 15    Comment: Performed at [facility] [address] [place], [place] [address]  CBC     Status: Abnormal   Collection Time: 01/16/20  5:24 AM  Result Value Ref Range   WBC 11.8 (H) 4.0 - 10.5 K/uL   RBC 3.29 (L) 3.87 - 5.11 MIL/uL   Hemoglobin 9.9 (L) 12.0 - 15.0  g/dL   HCT 31.6 (L) 36.0 - 46.0 %   MCV 96.0 80.0 - 100.0 fL   MCH 30.1 26.0 - 34.0 pg   MCHC 31.3 30.0 - 36.0 g/dL   RDW 15.7 (H) 11.5 - 15.5 %   Platelets 587 (H) 150 - 400 K/uL   nRBC 0.0 0.0 - 0.2 %    Comment: Performed at [facility] [address] [place], [place] [address]  Basic metabolic panel     Status: Abnormal   Collection Time: 01/17/20  5:14 AM  Result Value Ref Range   Sodium 141 135 - 145 mmol/L   Potassium 3.6 3.5 - 5.1 mmol/L   Chloride 109 98 - 111 mmol/L   CO2 24 22 - 32 mmol/L  Glucose, Bld 109 (H) 70 - 99 mg/dL    Comment: Glucose reference range applies only to samples taken after fasting for at least 8 hours.   BUN 20 8 - 23 mg/dL   Creatinine, Ser 0.85 0.44 - 1.00 mg/dL   Calcium 8.6 (L) 8.9 - 10.3 mg/dL   GFR calc non Af Amer 59 (L) >60 mL/min   GFR calc Af Amer >60 >60 mL/min   Anion gap 8 5 - 15    Comment: Performed at [facility] [address], [place], [place] [address]  CBC     Status: Abnormal   Collection Time: 01/17/20  5:14 AM  Result Value Ref Range   WBC 12.1 (H) 4.0 - 10.5 K/uL   RBC 3.38 (L) 3.87 - 5.11 MIL/uL   Hemoglobin 10.2 (L) 12.0 - 15.0 g/dL   HCT 32.4 (L) 36.0 - 46.0 %   MCV 95.9 80.0 - 100.0 fL   MCH 30.2 26.0 - 34.0 pg   MCHC 31.5 30.0 - 36.0 g/dL   RDW 15.7 (H) 11.5 - 15.5 %   Platelets 594 (H) 150 - 400 K/uL   nRBC 0.0 0.0 - 0.2 %    Comment: Performed at [facility] [address], [place], [place] [address]  CBC     Status: Abnormal   Collection Time: 01/18/20  5:32 AM  Result Value Ref Range   WBC 12.0 (H) 4.0 - 10.5 K/uL   RBC 3.44 (L) 3.87 - 5.11 MIL/uL   Hemoglobin 10.2 (L) 12.0 - 15.0 g/dL   HCT 32.5 (L) 36.0 - 46.0 %   MCV 94.5 80.0 - 100.0 fL   MCH 29.7 26.0 - 34.0 pg   MCHC 31.4 30.0 - 36.0 g/dL   RDW 15.9 (H) 11.5 - 15.5 %   Platelets 551 (H) 150 - 400 K/uL   nRBC 0.0 0.0 - 0.2 %    Comment: Performed at [facility] [address], [place], [place] [address]  CBC      Status: Abnormal   Collection Time: 01/19/20  5:01 AM  Result Value Ref Range   WBC 13.3 (H) 4.0 - 10.5 K/uL   RBC 3.43 (L) 3.87 - 5.11 MIL/uL   Hemoglobin 10.1 (L) 12.0 - 15.0 g/dL   HCT 32.7 (L) 36.0 - 46.0 %   MCV 95.3 80.0 - 100.0 fL   MCH 29.4 26.0 - 34.0 pg   MCHC 30.9 30.0 - 36.0 g/dL   RDW 16.1 (H) 11.5 - 15.5 %   Platelets 571 (H) 150 - 400 K/uL   nRBC 0.0 0.0 - 0.2 %    Comment: Performed at [facility] [address] [place], [place] [address]  CBC     Status: Abnormal   Collection Time: 01/20/20  5:35 AM  Result Value Ref Range   WBC 12.0 (H) 4.0 - 10.5 K/uL   RBC 3.47 (L) 3.87 - 5.11 MIL/uL   Hemoglobin 10.4 (L) 12.0 - 15.0 g/dL   HCT 32.7 (L) 36.0 - 46.0 %   MCV 94.2 80.0 - 100.0 fL   MCH 30.0 26.0 - 34.0 pg   MCHC 31.8 30.0 - 36.0 g/dL   RDW 16.1 (H) 11.5 - 15.5 %   Platelets 552 (H) 150 - 400 K/uL   nRBC 0.0 0.0 - 0.2 %    Comment: Performed at [facility] [address], [place],  [address]    Assessment/Plan: 1.  Dysuria Attempted to get urine specimen for UA and culture using hat. Patient missed the collection device. Sent home with kit to obtain and bring in first thing Monday morning. Can also have [place] obtain. Classic symptoms and + history. Start empiric ABX. Has tolerated Bactrim given by PCP in the past very well. Would like this again. Rx sent to pharmacy. Supportive measures and OTC medications reviewed. Strict ER precautions reviewed with family.  - POCT urinalysis dipstick - Urine Culture  This visit occurred during the SARS-CoV-2 public health emergency.  Safety protocols were in place, including screening questions prior to the visit, additional usage of staff PPE, and extensive cleaning of exam room while observing appropriate contact time as indicated for disinfecting solutions.     Leeanne Rio, PA-C

## 2020-01-21 NOTE — Telephone Encounter (Signed)
TC Call placed, no answered. Left message to return the call.

## 2020-01-22 ENCOUNTER — Ambulatory Visit: Payer: Medicare PPO | Admitting: Family Medicine

## 2020-01-25 ENCOUNTER — Telehealth: Payer: Self-pay | Admitting: Family Medicine

## 2020-01-25 ENCOUNTER — Other Ambulatory Visit: Payer: Self-pay

## 2020-01-25 DIAGNOSIS — K5730 Diverticulosis of large intestine without perforation or abscess without bleeding: Secondary | ICD-10-CM | POA: Diagnosis not present

## 2020-01-25 DIAGNOSIS — M1990 Unspecified osteoarthritis, unspecified site: Secondary | ICD-10-CM | POA: Diagnosis not present

## 2020-01-25 DIAGNOSIS — S5292XA Unspecified fracture of left forearm, initial encounter for closed fracture: Secondary | ICD-10-CM | POA: Diagnosis not present

## 2020-01-25 DIAGNOSIS — N183 Chronic kidney disease, stage 3 unspecified: Secondary | ICD-10-CM | POA: Diagnosis not present

## 2020-01-25 DIAGNOSIS — S52502D Unspecified fracture of the lower end of left radius, subsequent encounter for closed fracture with routine healing: Secondary | ICD-10-CM | POA: Diagnosis not present

## 2020-01-25 DIAGNOSIS — R300 Dysuria: Secondary | ICD-10-CM | POA: Diagnosis not present

## 2020-01-25 DIAGNOSIS — I10 Essential (primary) hypertension: Secondary | ICD-10-CM | POA: Diagnosis not present

## 2020-01-25 DIAGNOSIS — I129 Hypertensive chronic kidney disease with stage 1 through stage 4 chronic kidney disease, or unspecified chronic kidney disease: Secondary | ICD-10-CM | POA: Diagnosis not present

## 2020-01-25 DIAGNOSIS — M8580 Other specified disorders of bone density and structure, unspecified site: Secondary | ICD-10-CM | POA: Diagnosis not present

## 2020-01-25 DIAGNOSIS — Z48815 Encounter for surgical aftercare following surgery on the digestive system: Secondary | ICD-10-CM | POA: Diagnosis not present

## 2020-01-25 DIAGNOSIS — I739 Peripheral vascular disease, unspecified: Secondary | ICD-10-CM | POA: Diagnosis not present

## 2020-01-25 DIAGNOSIS — R5381 Other malaise: Secondary | ICD-10-CM | POA: Diagnosis not present

## 2020-01-25 DIAGNOSIS — D631 Anemia in chronic kidney disease: Secondary | ICD-10-CM | POA: Diagnosis not present

## 2020-01-25 DIAGNOSIS — S2232XD Fracture of one rib, left side, subsequent encounter for fracture with routine healing: Secondary | ICD-10-CM | POA: Diagnosis not present

## 2020-01-25 DIAGNOSIS — Z4801 Encounter for change or removal of surgical wound dressing: Secondary | ICD-10-CM | POA: Diagnosis not present

## 2020-01-25 NOTE — Telephone Encounter (Signed)
Spoke with patient son and advised to bring urine specimen in today for further evaluation. Patient symptoms continued over the weekend. Monday symptoms had started to improve but last night she began to reoccur.

## 2020-01-25 NOTE — Telephone Encounter (Signed)
[name] pt's daughter in law, called in stating that pt is still having pain, burning, and pressure. She states she feels like she can't empty her bladder. She wanted to know what else she could do about this and did Einar Pheasant wanted the family to bring in urine sample. Please advise.

## 2020-01-25 NOTE — Telephone Encounter (Signed)
Please advise 

## 2020-01-25 NOTE — Telephone Encounter (Signed)
Patient's urine dropped off by family. Urinalysis  was done by Eduard Clos CMA and culture obtained by Denita Lung LPN

## 2020-01-25 NOTE — Telephone Encounter (Signed)
Nothing further needed presently. Awaiting culture results. She is to continue current regimen until results are in.

## 2020-01-25 NOTE — Telephone Encounter (Signed)
Mainly need urine sample ASAP for culture. Would have her complete course of her current antibiotic since symptoms were improving -- could be urgency a little more noticeable last night while she was trying to rest. If nothing else has worsened and is improving would stay the course and get culture to help guide any further antibiotic therapy.

## 2020-01-25 NOTE — Telephone Encounter (Signed)
They need to bring in a urine specimen today since they did not drop off yesterday as requested at visit on Friday. We need to know what infection we are dealing with to see what antibiotic is going to work the best, especially with her intolerance to certain medications. Can they bring this in this morning? Any new or worsening symptoms?

## 2020-01-25 NOTE — Care Management (Signed)
Patient ID: [name], female   DOB: 01/31/1923, 86 y.o.   MRN: [id number] 3n1 ordered for discharge 01/20/20 was on back order. The item has come in and will be delivered to the patient's home. Family made aware of the item now in stock and delivery to home. Margarito Liner

## 2020-01-26 ENCOUNTER — Other Ambulatory Visit: Payer: Self-pay

## 2020-01-26 ENCOUNTER — Ambulatory Visit: Payer: Medicare PPO | Admitting: Family Medicine

## 2020-01-26 ENCOUNTER — Encounter: Payer: Self-pay | Admitting: Family Medicine

## 2020-01-26 VITALS — BP 133/83 | HR 78 | Temp 98.0°F | Resp 16 | Ht 66.0 in

## 2020-01-26 DIAGNOSIS — N183 Chronic kidney disease, stage 3 unspecified: Secondary | ICD-10-CM | POA: Diagnosis not present

## 2020-01-26 DIAGNOSIS — Z48815 Encounter for surgical aftercare following surgery on the digestive system: Secondary | ICD-10-CM | POA: Diagnosis not present

## 2020-01-26 DIAGNOSIS — K5730 Diverticulosis of large intestine without perforation or abscess without bleeding: Secondary | ICD-10-CM | POA: Diagnosis not present

## 2020-01-26 DIAGNOSIS — S52502A Unspecified fracture of the lower end of left radius, initial encounter for closed fracture: Secondary | ICD-10-CM | POA: Diagnosis not present

## 2020-01-26 DIAGNOSIS — Z933 Colostomy status: Secondary | ICD-10-CM | POA: Insufficient documentation

## 2020-01-26 DIAGNOSIS — I4891 Unspecified atrial fibrillation: Secondary | ICD-10-CM

## 2020-01-26 DIAGNOSIS — I739 Peripheral vascular disease, unspecified: Secondary | ICD-10-CM | POA: Diagnosis not present

## 2020-01-26 DIAGNOSIS — I129 Hypertensive chronic kidney disease with stage 1 through stage 4 chronic kidney disease, or unspecified chronic kidney disease: Secondary | ICD-10-CM | POA: Diagnosis not present

## 2020-01-26 DIAGNOSIS — N1831 Chronic kidney disease, stage 3a: Secondary | ICD-10-CM

## 2020-01-26 DIAGNOSIS — N179 Acute kidney failure, unspecified: Secondary | ICD-10-CM | POA: Diagnosis not present

## 2020-01-26 DIAGNOSIS — D631 Anemia in chronic kidney disease: Secondary | ICD-10-CM | POA: Diagnosis not present

## 2020-01-26 DIAGNOSIS — K5720 Diverticulitis of large intestine with perforation and abscess without bleeding: Secondary | ICD-10-CM | POA: Insufficient documentation

## 2020-01-26 DIAGNOSIS — S2232XD Fracture of one rib, left side, subsequent encounter for fracture with routine healing: Secondary | ICD-10-CM | POA: Diagnosis not present

## 2020-01-26 DIAGNOSIS — S52502D Unspecified fracture of the lower end of left radius, subsequent encounter for closed fracture with routine healing: Secondary | ICD-10-CM | POA: Diagnosis not present

## 2020-01-26 DIAGNOSIS — Z4801 Encounter for change or removal of surgical wound dressing: Secondary | ICD-10-CM | POA: Diagnosis not present

## 2020-01-26 LAB — URINE CULTURE
MICRO NUMBER:: 10256882
Result:: NO GROWTH
SPECIMEN QUALITY:: ADEQUATE

## 2020-01-26 NOTE — Assessment & Plan Note (Signed)
S/p ORIF.  Following w/ Dr Caralyn Guile.  Has f/u scheduled.

## 2020-01-26 NOTE — Assessment & Plan Note (Signed)
Repeat BMP to assess renal fxn.

## 2020-01-26 NOTE — Progress Notes (Signed)
   Subjective:    Patient ID: [name], female    DOB: Mar 21, 1923, 86 y.o.   MRN: [id number]  [facility] f/u- pt was admitted on 2/17 w/ L distal radius fx, L rib fx, ARF.  Had ORIF on 2/20.  Subsequently developed pneumoperitoneum due to perforated sigmoid diverticulitis.  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  She also struggled w/ urinary retention and had foley for decompression.  Had bladder scan the night before d/c and bladder was empty.  Developed Afib w/ RVR on 2/26 and was treated w/ beta blockers and IV heparin per Cards.  Cardizem and Amiodarone were added and she was transitioned to Eliquis.  Now following w/ Cardiology Doylene Canard), Ortho Caralyn Guile).  Pt reports pain is fairly well controlled but she is having urinary issues.  Having to get up hourly during the night, burning w/ urination, mild nausea.  + suprapubic pressure.  + incomplete emptying.  No blood in urine.  Took 5 days of Bactrim twice daily- w/o improvement.  Pt is doing well w/ colostomy- emptying 2-3x/day and changing bag twice weekly.  Pt now has 24-7 care.   Review of Systems For ROS see HPI   This visit occurred during the SARS-CoV-2 public health emergency.  Safety protocols were in place, including screening questions prior to the visit, additional usage of staff PPE, and extensive cleaning of exam room while observing appropriate contact time as indicated for disinfecting solutions.       Objective:   Physical Exam Vitals reviewed.  Constitutional:      General: She is not in acute distress.    Appearance: Normal appearance.  HENT:     Head: Normocephalic and atraumatic.  Cardiovascular:     Rate and Rhythm: Normal rate. Rhythm irregular.     Pulses: Normal pulses.     Heart sounds: Normal heart sounds.  Pulmonary:     Effort: Pulmonary effort is normal. No respiratory distress.     Breath sounds: Normal breath sounds. No wheezing.  Abdominal:     Comments:  Colostomy site appears clean, functioning normally Abdominal bandages are C/D/I  Musculoskeletal:     Cervical back: Normal range of motion and neck supple.     Comments: L forearm in cast, no swelling of fingers  Lymphadenopathy:     Cervical: No cervical adenopathy.  Skin:    General: Skin is warm and dry.  Neurological:     General: No focal deficit present.     Mental Status: She is alert and oriented to person, place, and time.  Psychiatric:        Mood and Affect: Mood normal.        Behavior: Behavior normal.        Thought Content: Thought content normal.           Assessment & Plan:

## 2020-01-26 NOTE — Patient Instructions (Addendum)
We'll determine follow up based on the lab results and consult notes We'll notify you of your lab results and make any changes if needed Once your urine culture is available we will start antibiotics if needed It is ok to be emotional and need to process this.  That just means you're human. Call with any questions or concerns Hang in there!!

## 2020-01-26 NOTE — Assessment & Plan Note (Signed)
New to provider.  Pt developed this during her recent hospitalization.  She is on Metoprolol and Diltiazem for rate control as well as Amiodarone.  On Eliquis for anticoagulation.  She is irregularly irregular today but rate controlled.  Has cards f/u next week.

## 2020-01-26 NOTE — Assessment & Plan Note (Signed)
Now s/p sigmoid colectomy w/ colostomy.  She is adjusting to this very big change and is doing very well.  She has good family support and they now have caregivers to stay with her as well.  Home Health are currently doing the ostomy changes until family can learn how.  Has f/u w/ Dr Redmond Pulling scheduled.

## 2020-01-26 NOTE — Assessment & Plan Note (Signed)
New.  Pt is adjusting but this is obviously a big change for her.  Had a long discussion that it's ok to be upset/angry/frustrated/sad/thankful and it's ok to ask for help.  Encouraged her to take the time she needs to process this rather than telling everyone that she is 'fine'.  Will follow.

## 2020-01-27 ENCOUNTER — Other Ambulatory Visit: Payer: Self-pay | Admitting: General Practice

## 2020-01-27 ENCOUNTER — Other Ambulatory Visit: Payer: Self-pay | Admitting: Family Medicine

## 2020-01-27 ENCOUNTER — Telehealth: Payer: Self-pay | Admitting: Emergency Medicine

## 2020-01-27 DIAGNOSIS — R7989 Other specified abnormal findings of blood chemistry: Secondary | ICD-10-CM

## 2020-01-27 DIAGNOSIS — R350 Frequency of micturition: Secondary | ICD-10-CM

## 2020-01-27 DIAGNOSIS — R799 Abnormal finding of blood chemistry, unspecified: Secondary | ICD-10-CM

## 2020-01-27 LAB — CBC WITH DIFFERENTIAL/PLATELET
Basophils Absolute: 0.1 10*3/uL (ref 0.0–0.1)
Basophils Relative: 0.7 % (ref 0.0–3.0)
Eosinophils Absolute: 0.2 10*3/uL (ref 0.0–0.7)
Eosinophils Relative: 2.3 % (ref 0.0–5.0)
HCT: 36.6 % (ref 36.0–46.0)
Hemoglobin: 11.9 g/dL — ABNORMAL LOW (ref 12.0–15.0)
Lymphocytes Relative: 20.9 % (ref 12.0–46.0)
Lymphs Abs: 1.9 10*3/uL (ref 0.7–4.0)
MCHC: 32.6 g/dL (ref 30.0–36.0)
MCV: 93.6 fl (ref 78.0–100.0)
Monocytes Absolute: 0.9 10*3/uL (ref 0.1–1.0)
Monocytes Relative: 9.6 % (ref 3.0–12.0)
Neutro Abs: 5.9 10*3/uL (ref 1.4–7.7)
Neutrophils Relative %: 66.5 % (ref 43.0–77.0)
Platelets: 448 10*3/uL — ABNORMAL HIGH (ref 150.0–400.0)
RBC: 3.91 Mil/uL (ref 3.87–5.11)
RDW: 16.8 % — ABNORMAL HIGH (ref 11.5–15.5)
WBC: 8.9 10*3/uL (ref 4.0–10.5)

## 2020-01-27 LAB — BASIC METABOLIC PANEL
BUN: 15 mg/dL (ref 6–23)
CO2: 24 mEq/L (ref 19–32)
Calcium: 9.2 mg/dL (ref 8.4–10.5)
Chloride: 104 mEq/L (ref 96–112)
Creatinine, Ser: 1.35 mg/dL — ABNORMAL HIGH (ref 0.40–1.20)
GFR: 36.45 mL/min — ABNORMAL LOW (ref >60.00)
Glucose, Bld: 86 mg/dL (ref 70–99)
Potassium: 5.1 mEq/L (ref 3.5–5.1)
Sodium: 136 mEq/L (ref 135–145)

## 2020-01-27 NOTE — Telephone Encounter (Signed)
Sharyn Lull from [facility] for verbal orders for PT for patient. Requesting PT for once a week for 4 weeks, then twice a week for 4 weeks for strength and balance  Sharyn Lull at [facility] call back number is [phone]

## 2020-01-27 NOTE — Telephone Encounter (Signed)
Ok to proceed. 

## 2020-01-27 NOTE — Telephone Encounter (Signed)
Called and left a detailed message to give verbal ok for orders.

## 2020-01-28 DIAGNOSIS — Z4801 Encounter for change or removal of surgical wound dressing: Secondary | ICD-10-CM | POA: Diagnosis not present

## 2020-01-28 DIAGNOSIS — N183 Chronic kidney disease, stage 3 unspecified: Secondary | ICD-10-CM | POA: Diagnosis not present

## 2020-01-28 DIAGNOSIS — K5730 Diverticulosis of large intestine without perforation or abscess without bleeding: Secondary | ICD-10-CM | POA: Diagnosis not present

## 2020-01-28 DIAGNOSIS — I739 Peripheral vascular disease, unspecified: Secondary | ICD-10-CM | POA: Diagnosis not present

## 2020-01-28 DIAGNOSIS — S2232XD Fracture of one rib, left side, subsequent encounter for fracture with routine healing: Secondary | ICD-10-CM | POA: Diagnosis not present

## 2020-01-28 DIAGNOSIS — Z48815 Encounter for surgical aftercare following surgery on the digestive system: Secondary | ICD-10-CM | POA: Diagnosis not present

## 2020-01-28 DIAGNOSIS — S52502D Unspecified fracture of the lower end of left radius, subsequent encounter for closed fracture with routine healing: Secondary | ICD-10-CM | POA: Diagnosis not present

## 2020-01-28 DIAGNOSIS — I129 Hypertensive chronic kidney disease with stage 1 through stage 4 chronic kidney disease, or unspecified chronic kidney disease: Secondary | ICD-10-CM | POA: Diagnosis not present

## 2020-01-28 DIAGNOSIS — D631 Anemia in chronic kidney disease: Secondary | ICD-10-CM | POA: Diagnosis not present

## 2020-01-29 ENCOUNTER — Other Ambulatory Visit: Payer: Self-pay | Admitting: Physical Medicine and Rehabilitation

## 2020-01-31 DIAGNOSIS — I739 Peripheral vascular disease, unspecified: Secondary | ICD-10-CM | POA: Diagnosis not present

## 2020-01-31 DIAGNOSIS — K5730 Diverticulosis of large intestine without perforation or abscess without bleeding: Secondary | ICD-10-CM | POA: Diagnosis not present

## 2020-01-31 DIAGNOSIS — D631 Anemia in chronic kidney disease: Secondary | ICD-10-CM | POA: Diagnosis not present

## 2020-01-31 DIAGNOSIS — N183 Chronic kidney disease, stage 3 unspecified: Secondary | ICD-10-CM | POA: Diagnosis not present

## 2020-01-31 DIAGNOSIS — I129 Hypertensive chronic kidney disease with stage 1 through stage 4 chronic kidney disease, or unspecified chronic kidney disease: Secondary | ICD-10-CM | POA: Diagnosis not present

## 2020-01-31 DIAGNOSIS — Z4801 Encounter for change or removal of surgical wound dressing: Secondary | ICD-10-CM | POA: Diagnosis not present

## 2020-01-31 DIAGNOSIS — S52502D Unspecified fracture of the lower end of left radius, subsequent encounter for closed fracture with routine healing: Secondary | ICD-10-CM | POA: Diagnosis not present

## 2020-01-31 DIAGNOSIS — S2232XD Fracture of one rib, left side, subsequent encounter for fracture with routine healing: Secondary | ICD-10-CM | POA: Diagnosis not present

## 2020-01-31 DIAGNOSIS — Z48815 Encounter for surgical aftercare following surgery on the digestive system: Secondary | ICD-10-CM | POA: Diagnosis not present

## 2020-02-01 ENCOUNTER — Encounter: Payer: Medicare PPO | Attending: Physical Medicine and Rehabilitation | Admitting: Physical Medicine and Rehabilitation

## 2020-02-01 ENCOUNTER — Other Ambulatory Visit: Payer: Self-pay

## 2020-02-01 ENCOUNTER — Encounter: Payer: Self-pay | Admitting: Physical Medicine and Rehabilitation

## 2020-02-01 VITALS — BP 116/73 | HR 81 | Temp 97.5°F | Ht 66.0 in | Wt 145.0 lb

## 2020-02-01 DIAGNOSIS — G8918 Other acute postprocedural pain: Secondary | ICD-10-CM | POA: Diagnosis not present

## 2020-02-01 DIAGNOSIS — R296 Repeated falls: Secondary | ICD-10-CM | POA: Diagnosis not present

## 2020-02-01 DIAGNOSIS — E039 Hypothyroidism, unspecified: Secondary | ICD-10-CM

## 2020-02-01 DIAGNOSIS — Z4789 Encounter for other orthopedic aftercare: Secondary | ICD-10-CM | POA: Diagnosis not present

## 2020-02-01 DIAGNOSIS — R5381 Other malaise: Secondary | ICD-10-CM | POA: Diagnosis not present

## 2020-02-01 DIAGNOSIS — K5720 Diverticulitis of large intestine with perforation and abscess without bleeding: Secondary | ICD-10-CM

## 2020-02-01 DIAGNOSIS — S52502D Unspecified fracture of the lower end of left radius, subsequent encounter for closed fracture with routine healing: Secondary | ICD-10-CM | POA: Diagnosis not present

## 2020-02-01 MED ORDER — ONDANSETRON HCL 4 MG PO TABS: 4.0000 mg | ORAL_TABLET | Freq: Three times a day (TID) | ORAL | 0 refills | Status: DC | PRN

## 2020-02-01 NOTE — Patient Instructions (Signed)
Can stop taking Protonix and Probiotic and can take Zofran as needed for nausea.

## 2020-02-01 NOTE — Progress Notes (Signed)
Subjective:    Patient ID: [name], female    DOB: Jun 14, 1923, 86 y.o.   MRN: [id number]  HPI  [name] is a 86 year old woman who presents for a transitional care follow-up after CIR admission for physical debility. Her son accompanies her today.   She has been receiving home PT and has been working on lower extremity strengthening and ambulation with good results. She ambulates around her home about 10 times per day. The most she has walked at one time has been 5 minutes. She has not yet attempted community ambulation. She does have a handicap placard. She denies falls.  She does not require any refills of her medications. She is unclear why she takes them all. Medications reviewed and she has been taking them as prescribed. She does not have any symptoms of abdominal pain or GERD.  She has orthopedic follow-up today and her left cast will be removed. She has had follow-up with her PCP after discharge where she was treated for a UTI and her symptoms have since resolved.   Pain has been well controlled and she is not requiring Oxycodone. She has follow-up labs scheduled this Thursday with her PCP. She asks whether she needs to take her probiotic and whether she could take her levothyroxine before eating in the morning.    Pain Inventory Average Pain 0 Pain Right Now 0 My pain is no pain  In the last 24 hours, has pain interfered with the following? General activity 0 Relation with others 0 Enjoyment of life 0 What TIME of day is your pain at its worst? no pain Sleep (in general) Fair  Pain is worse with: no pain Pain improves with: no pain Relief from Meds: no pain  Mobility walk with assistance use a cane use a walker how many minutes can you walk? 5 ability to climb steps?  no do you drive?  no use a wheelchair needs help with transfers  Function I need assistance with the following:  dressing, bathing, toileting, meal prep, household duties and  shopping  Neuro/Psych weakness numbness tingling trouble walking dizziness  Prior Studies Any changes since last visit?  no  Physicians involved in your care Any changes since last visit?  no   Family History  Problem Relation Age of Onset  . Cancer Mother        BREAST  . COPD Father   . Emphysema Father   . Cancer Daughter        breast   Social History   Socioeconomic History  . Marital status: Widowed    Spouse name: Not on file  . Number of children: Not on file  . Years of education: Not on file  . Highest education level: Not on file  Occupational History  . Not on file  Tobacco Use  . Smoking status: Never Smoker  . Smokeless tobacco: Never Used  Substance and Sexual Activity  . Alcohol use: No  . Drug use: No  . Sexual activity: Never  Other Topics Concern  . Not on file  Social History Narrative  . Not on file   Social Determinants of Health   Financial Resource Strain:   . Difficulty of Paying Living Expenses:   Food Insecurity:   . Worried About Charity fundraiser in the Last Year:   . Arboriculturist in the Last Year:   Transportation Needs:   . Film/video editor (Medical):   Marland Kitchen Lack of Transportation (Non-Medical):  Physical Activity:   . Days of Exercise per Week:   . Minutes of Exercise per Session:   Stress:   . Feeling of Stress :   Social Connections:   . Frequency of Communication with Friends and Family:   . Frequency of Social Gatherings with Friends and Family:   . Attends Religious Services:   . Active Member of Clubs or Organizations:   . Attends Archivist Meetings:   Marland Kitchen Marital Status:    Past Surgical History:  Procedure Laterality Date  . ABDOMINAL HYSTERECTOMY    . APPENDECTOMY    . BACK SURGERY    . BREAST SURGERY     left breast biopsy  . COLOSTOMY N/A 01/06/2020   Procedure: Colostomy;  Surgeon: Greer Pickerel, MD;  Location: [place];  Service: General;  Laterality: N/A;  . EYE SURGERY      cataract  . HARDWARE REMOVAL Left 01/01/2020   Procedure: HARDWARE REMOVAL;  Surgeon: Iran Planas, MD;  Location: [place];  Service: Orthopedics;  Laterality: Left;  . LAPAROTOMY N/A 01/06/2020   Procedure: Exploratory Laparotomy, Open Sigmoid colectomy;  Surgeon: Greer Pickerel, MD;  Location: [place];  Service: General;  Laterality: N/A;  . LIPOMA EXCISION Left 04/25/2015   Procedure: EXCISION OF LIPOMA LEFT ARM;  Surgeon: Donnie Mesa, MD;  Location: [place];  Service: General;  Laterality: Left;  . LUMBAR LAMINECTOMY/DECOMPRESSION MICRODISCECTOMY  11/20/2011   Procedure: LUMBAR LAMINECTOMY/DECOMPRESSION MICRODISCECTOMY;  Surgeon: Jeneen Rinks P [place];  Location: [facility];  Service: Orthopedics;  Laterality: N/A;  Decompressive Laminectomy L2 to the Sacrum (X-Ray)  . OPEN REDUCTION INTERNAL FIXATION (ORIF) DISTAL RADIAL FRACTURE Left 01/01/2020   Procedure: OPEN REDUCTION INTERNAL FIXATION (ORIF) DISTAL RADIAL FRACTURE;  Surgeon: Iran Planas, MD;  Location: [place];  Service: Orthopedics;  Laterality: Left;  . OTHER SURGICAL HISTORY     left wrist surgery - has plate in left wrist   . OTHER SURGICAL HISTORY     right knee surgery due to torn cartilage   . TONSILLECTOMY    . WRIST FRACTURE SURGERY Left    Past Medical History:  Diagnosis Date  . DVT (deep venous thrombosis) ([place]) 09/2015   RLE  . GERD (gastroesophageal reflux disease)   . Hypertension   . Hypothyroidism   . Melanoma ([place]) 06/16/2017   Facial melanoma, removed by Dr. Harvel Quale  . Peripheral vascular disease ([facility])    peripheral neuropathy   BP 116/73   Pulse 81   Temp (!) 97.5 F (36.4 C)   Ht 5\' 6"  (1.676 m)   Wt 145 lb (65.8 kg)   SpO2 97%   BMI 23.40 kg/m   Opioid Risk Score:   Fall Risk Score:  `1  Depression screen PHQ 2/9  Depression screen [facility] 2/9 01/26/2020 11/16/2019 09/24/2019 06/22/2019 06/07/2019 12/23/2018 08/12/2018  Decreased Interest 0 0 0 0 0 0 0  Down, Depressed, Hopeless 0 0 0 0 0 0 0  PHQ  - 2 Score 0 0 0 0 0 0 0  Altered sleeping 0 - - 0 0 0 -  Tired, decreased energy 0 - - 0 0 0 -  Change in appetite 0 - - 0 0 0 -  Feeling bad or failure about yourself  0 - - 0 0 0 -  Trouble concentrating 0 - - 0 0 0 -  Moving slowly or fidgety/restless 0 - - 0 0 0 -  Suicidal thoughts 0 - - 0 0 - -  PHQ-9 Score 0 - - 0 0 0 -  Difficult doing work/chores Not difficult at all - - Not difficult at all Not difficult at all Not difficult at all -  Some recent data might be hidden    Review of Systems  Constitutional: Negative.   HENT: Negative.   Eyes: Negative.   Respiratory: Negative.   Cardiovascular: Negative.   Gastrointestinal: Negative.   Endocrine: Negative.   Genitourinary: Negative.   Musculoskeletal: Positive for gait problem.  Allergic/Immunologic: Negative.   Neurological: Positive for dizziness, weakness and numbness.       Tingling   Hematological: Negative.   Psychiatric/Behavioral: Negative.   All other systems reviewed and are negative.      Objective:   Physical Exam Constitutional: No distress . Vital signs reviewed. HENT: Normocephalic.  Atraumatic. Eyes: EOMI. No discharge. Cardiovascular: No JVD. Respiratory: Normal effort.  No stridor. GI: Non-distended.  + Ostomy. Skin: Left upper extremity with hard cast with proximal ecchymosis Psych: Normal mood.  Normal behavior. Musc:Edema in LUE improved.  Neurological: Alert and oriented x3.  HOH Motor: RUE: 4+/5 proximal distal LUE: Shoulder abduction 2+/5, elbow flexion/extension 3 -/5, proximal limited by cast, hand grip 4/5, stable Bilateral lower extremities: 4+/5 proximal to distal, improving Ambulation not attempted; wheeled in [facility] by son.      Assessment & Plan:  1. Deficits with mobility, transfers, self-care secondary to multi--Ortho.             Continue Home PT and OT. Encouraged HEP on days when she does not have therapy. Encourged staring outdoor ambulation with assistance for  strengthening and fresh air.   2. Antithrombotics:  Continue Eliquis 5mg  BID. Does not require refills.  3. Pain Management:    Well controlled. No longer requiring Oxycodone.   4. Skin/Wound Care: Continue Vitamin A and Vitamin C for wound healing.   5. Fluids/Electrolytes/Nutrition:   Repeat BMP scheduled with PCP on Thursday.   6. Left wrist fracture s/p internal fixation: NWB LUE-changed to short arm cast on 3/8 per Ortho   Has ortho f/u today with plan for cast removal.   7. Perforated diverticulum s/p repair with colostomy: Family refusing education and patient unable to perform care due to right wrist fracture.   Family has been helping patient with care and they are very happy with current system.   8. New onset A fib with RVR:  Continue amiodarone, metoprolol and Cardizem   9. Polymyalgia rheumatica: Continue prednisone  10. Hypothyroid: Continue Synthroid. Advised to take 1 hour before meals in morning.  11. Anemia:              Will be repeated on Thursday labs.  12.  Leukocytosis-likely related to steroids             WBCs 12.0 on 3/11             Will be repeated on Thursday labs.    All questions were answered. RTC as needed.

## 2020-02-03 ENCOUNTER — Ambulatory Visit (INDEPENDENT_AMBULATORY_CARE_PROVIDER_SITE_OTHER): Payer: Medicare PPO

## 2020-02-03 ENCOUNTER — Other Ambulatory Visit: Payer: Self-pay

## 2020-02-03 DIAGNOSIS — R7989 Other specified abnormal findings of blood chemistry: Secondary | ICD-10-CM

## 2020-02-03 DIAGNOSIS — R799 Abnormal finding of blood chemistry, unspecified: Secondary | ICD-10-CM | POA: Diagnosis not present

## 2020-02-03 DIAGNOSIS — R350 Frequency of micturition: Secondary | ICD-10-CM | POA: Diagnosis not present

## 2020-02-03 LAB — POCT URINALYSIS DIPSTICK
Bilirubin, UA: NEGATIVE
Blood, UA: NEGATIVE
Glucose, UA: NEGATIVE
Ketones, UA: NEGATIVE
Leukocytes, UA: NEGATIVE
Nitrite, UA: NEGATIVE
Protein, UA: NEGATIVE
Spec Grav, UA: 1.025 (ref 1.010–1.025)
Urobilinogen, UA: 0.2 E.U./dL
pH, UA: 5.5 (ref 5.0–8.0)

## 2020-02-03 LAB — BASIC METABOLIC PANEL
BUN: 18 mg/dL (ref 6–23)
CO2: 22 mEq/L (ref 19–32)
Calcium: 9.3 mg/dL (ref 8.4–10.5)
Chloride: 106 mEq/L (ref 96–112)
Creatinine, Ser: 1.02 mg/dL (ref 0.40–1.20)
GFR: 50.37 mL/min — ABNORMAL LOW (ref >60.00)
Glucose, Bld: 86 mg/dL (ref 70–99)
Potassium: 4.5 mEq/L (ref 3.5–5.1)
Sodium: 137 mEq/L (ref 135–145)

## 2020-02-04 DIAGNOSIS — Z48815 Encounter for surgical aftercare following surgery on the digestive system: Secondary | ICD-10-CM | POA: Diagnosis not present

## 2020-02-04 DIAGNOSIS — I739 Peripheral vascular disease, unspecified: Secondary | ICD-10-CM | POA: Diagnosis not present

## 2020-02-04 DIAGNOSIS — S52502D Unspecified fracture of the lower end of left radius, subsequent encounter for closed fracture with routine healing: Secondary | ICD-10-CM | POA: Diagnosis not present

## 2020-02-04 DIAGNOSIS — I129 Hypertensive chronic kidney disease with stage 1 through stage 4 chronic kidney disease, or unspecified chronic kidney disease: Secondary | ICD-10-CM | POA: Diagnosis not present

## 2020-02-04 DIAGNOSIS — Z4801 Encounter for change or removal of surgical wound dressing: Secondary | ICD-10-CM | POA: Diagnosis not present

## 2020-02-04 DIAGNOSIS — S2232XD Fracture of one rib, left side, subsequent encounter for fracture with routine healing: Secondary | ICD-10-CM | POA: Diagnosis not present

## 2020-02-04 DIAGNOSIS — K5730 Diverticulosis of large intestine without perforation or abscess without bleeding: Secondary | ICD-10-CM | POA: Diagnosis not present

## 2020-02-04 DIAGNOSIS — D631 Anemia in chronic kidney disease: Secondary | ICD-10-CM | POA: Diagnosis not present

## 2020-02-04 DIAGNOSIS — N183 Chronic kidney disease, stage 3 unspecified: Secondary | ICD-10-CM | POA: Diagnosis not present

## 2020-02-04 LAB — URINE CULTURE
MICRO NUMBER:: 10293946
Result:: NO GROWTH
SPECIMEN QUALITY:: ADEQUATE

## 2020-02-07 ENCOUNTER — Encounter: Payer: Self-pay | Admitting: Family Medicine

## 2020-02-07 DIAGNOSIS — N183 Chronic kidney disease, stage 3 unspecified: Secondary | ICD-10-CM | POA: Diagnosis not present

## 2020-02-07 DIAGNOSIS — Z48815 Encounter for surgical aftercare following surgery on the digestive system: Secondary | ICD-10-CM | POA: Diagnosis not present

## 2020-02-07 DIAGNOSIS — D631 Anemia in chronic kidney disease: Secondary | ICD-10-CM | POA: Diagnosis not present

## 2020-02-07 DIAGNOSIS — K5730 Diverticulosis of large intestine without perforation or abscess without bleeding: Secondary | ICD-10-CM | POA: Diagnosis not present

## 2020-02-07 DIAGNOSIS — I129 Hypertensive chronic kidney disease with stage 1 through stage 4 chronic kidney disease, or unspecified chronic kidney disease: Secondary | ICD-10-CM | POA: Diagnosis not present

## 2020-02-07 DIAGNOSIS — S2232XD Fracture of one rib, left side, subsequent encounter for fracture with routine healing: Secondary | ICD-10-CM | POA: Diagnosis not present

## 2020-02-07 DIAGNOSIS — S52502D Unspecified fracture of the lower end of left radius, subsequent encounter for closed fracture with routine healing: Secondary | ICD-10-CM | POA: Diagnosis not present

## 2020-02-07 DIAGNOSIS — Z4801 Encounter for change or removal of surgical wound dressing: Secondary | ICD-10-CM | POA: Diagnosis not present

## 2020-02-07 DIAGNOSIS — I739 Peripheral vascular disease, unspecified: Secondary | ICD-10-CM | POA: Diagnosis not present

## 2020-02-08 ENCOUNTER — Other Ambulatory Visit: Payer: Self-pay | Admitting: Family Medicine

## 2020-02-08 DIAGNOSIS — I4821 Permanent atrial fibrillation: Secondary | ICD-10-CM | POA: Diagnosis not present

## 2020-02-08 DIAGNOSIS — N1831 Chronic kidney disease, stage 3a: Secondary | ICD-10-CM | POA: Diagnosis not present

## 2020-02-08 DIAGNOSIS — I10 Essential (primary) hypertension: Secondary | ICD-10-CM | POA: Diagnosis not present

## 2020-02-08 DIAGNOSIS — K5780 Diverticulitis of intestine, part unspecified, with perforation and abscess without bleeding: Secondary | ICD-10-CM | POA: Diagnosis not present

## 2020-02-08 NOTE — Telephone Encounter (Signed)
Let patient know PCP out of office. Message left for PCP to review on return to clinic.

## 2020-02-09 ENCOUNTER — Telehealth: Payer: Self-pay | Admitting: Family Medicine

## 2020-02-09 DIAGNOSIS — S52502D Unspecified fracture of the lower end of left radius, subsequent encounter for closed fracture with routine healing: Secondary | ICD-10-CM | POA: Diagnosis not present

## 2020-02-09 DIAGNOSIS — N183 Chronic kidney disease, stage 3 unspecified: Secondary | ICD-10-CM | POA: Diagnosis not present

## 2020-02-09 DIAGNOSIS — K5730 Diverticulosis of large intestine without perforation or abscess without bleeding: Secondary | ICD-10-CM | POA: Diagnosis not present

## 2020-02-09 DIAGNOSIS — I739 Peripheral vascular disease, unspecified: Secondary | ICD-10-CM | POA: Diagnosis not present

## 2020-02-09 DIAGNOSIS — Z4801 Encounter for change or removal of surgical wound dressing: Secondary | ICD-10-CM | POA: Diagnosis not present

## 2020-02-09 DIAGNOSIS — I129 Hypertensive chronic kidney disease with stage 1 through stage 4 chronic kidney disease, or unspecified chronic kidney disease: Secondary | ICD-10-CM | POA: Diagnosis not present

## 2020-02-09 DIAGNOSIS — D631 Anemia in chronic kidney disease: Secondary | ICD-10-CM | POA: Diagnosis not present

## 2020-02-09 DIAGNOSIS — S2232XD Fracture of one rib, left side, subsequent encounter for fracture with routine healing: Secondary | ICD-10-CM | POA: Diagnosis not present

## 2020-02-09 DIAGNOSIS — Z48815 Encounter for surgical aftercare following surgery on the digestive system: Secondary | ICD-10-CM | POA: Diagnosis not present

## 2020-02-09 NOTE — Telephone Encounter (Signed)
Document in your folder for review and signature

## 2020-02-09 NOTE — Telephone Encounter (Signed)
I have placed a HH plan of care from [facility] in the bin upfront.

## 2020-02-10 ENCOUNTER — Other Ambulatory Visit: Payer: Self-pay | Admitting: Physical Medicine and Rehabilitation

## 2020-02-11 ENCOUNTER — Other Ambulatory Visit: Payer: Self-pay | Admitting: Physical Medicine and Rehabilitation

## 2020-02-11 DIAGNOSIS — K5730 Diverticulosis of large intestine without perforation or abscess without bleeding: Secondary | ICD-10-CM | POA: Diagnosis not present

## 2020-02-11 DIAGNOSIS — Z4801 Encounter for change or removal of surgical wound dressing: Secondary | ICD-10-CM | POA: Diagnosis not present

## 2020-02-11 DIAGNOSIS — Z48815 Encounter for surgical aftercare following surgery on the digestive system: Secondary | ICD-10-CM | POA: Diagnosis not present

## 2020-02-11 DIAGNOSIS — S2232XD Fracture of one rib, left side, subsequent encounter for fracture with routine healing: Secondary | ICD-10-CM | POA: Diagnosis not present

## 2020-02-11 DIAGNOSIS — N183 Chronic kidney disease, stage 3 unspecified: Secondary | ICD-10-CM | POA: Diagnosis not present

## 2020-02-11 DIAGNOSIS — S52502D Unspecified fracture of the lower end of left radius, subsequent encounter for closed fracture with routine healing: Secondary | ICD-10-CM | POA: Diagnosis not present

## 2020-02-11 DIAGNOSIS — D631 Anemia in chronic kidney disease: Secondary | ICD-10-CM | POA: Diagnosis not present

## 2020-02-11 DIAGNOSIS — I739 Peripheral vascular disease, unspecified: Secondary | ICD-10-CM | POA: Diagnosis not present

## 2020-02-11 DIAGNOSIS — I129 Hypertensive chronic kidney disease with stage 1 through stage 4 chronic kidney disease, or unspecified chronic kidney disease: Secondary | ICD-10-CM | POA: Diagnosis not present

## 2020-02-14 DIAGNOSIS — S52502D Unspecified fracture of the lower end of left radius, subsequent encounter for closed fracture with routine healing: Secondary | ICD-10-CM | POA: Diagnosis not present

## 2020-02-14 DIAGNOSIS — D631 Anemia in chronic kidney disease: Secondary | ICD-10-CM | POA: Diagnosis not present

## 2020-02-14 DIAGNOSIS — Z48815 Encounter for surgical aftercare following surgery on the digestive system: Secondary | ICD-10-CM | POA: Diagnosis not present

## 2020-02-14 DIAGNOSIS — S2232XD Fracture of one rib, left side, subsequent encounter for fracture with routine healing: Secondary | ICD-10-CM | POA: Diagnosis not present

## 2020-02-14 DIAGNOSIS — I129 Hypertensive chronic kidney disease with stage 1 through stage 4 chronic kidney disease, or unspecified chronic kidney disease: Secondary | ICD-10-CM | POA: Diagnosis not present

## 2020-02-14 DIAGNOSIS — I739 Peripheral vascular disease, unspecified: Secondary | ICD-10-CM | POA: Diagnosis not present

## 2020-02-14 DIAGNOSIS — K5730 Diverticulosis of large intestine without perforation or abscess without bleeding: Secondary | ICD-10-CM | POA: Diagnosis not present

## 2020-02-14 DIAGNOSIS — Z4801 Encounter for change or removal of surgical wound dressing: Secondary | ICD-10-CM | POA: Diagnosis not present

## 2020-02-14 DIAGNOSIS — N183 Chronic kidney disease, stage 3 unspecified: Secondary | ICD-10-CM | POA: Diagnosis not present

## 2020-02-15 ENCOUNTER — Encounter: Payer: Self-pay | Admitting: Family Medicine

## 2020-02-15 ENCOUNTER — Other Ambulatory Visit: Payer: Self-pay

## 2020-02-15 ENCOUNTER — Other Ambulatory Visit: Payer: Self-pay | Admitting: Physical Medicine and Rehabilitation

## 2020-02-15 ENCOUNTER — Telehealth (INDEPENDENT_AMBULATORY_CARE_PROVIDER_SITE_OTHER): Payer: Medicare PPO | Admitting: Family Medicine

## 2020-02-15 ENCOUNTER — Other Ambulatory Visit: Payer: Self-pay | Admitting: Family Medicine

## 2020-02-15 DIAGNOSIS — R200 Anesthesia of skin: Secondary | ICD-10-CM | POA: Diagnosis not present

## 2020-02-15 DIAGNOSIS — I4891 Unspecified atrial fibrillation: Secondary | ICD-10-CM | POA: Diagnosis not present

## 2020-02-15 DIAGNOSIS — E876 Hypokalemia: Secondary | ICD-10-CM | POA: Diagnosis not present

## 2020-02-15 DIAGNOSIS — K219 Gastro-esophageal reflux disease without esophagitis: Secondary | ICD-10-CM

## 2020-02-15 DIAGNOSIS — Z79899 Other long term (current) drug therapy: Secondary | ICD-10-CM

## 2020-02-15 MED ORDER — PANTOPRAZOLE SODIUM 40 MG PO TBEC: 40.0000 mg | DELAYED_RELEASE_TABLET | Freq: Every day | ORAL | 1 refills | Status: DC

## 2020-02-15 MED ORDER — GABAPENTIN 300 MG PO CAPS: 600.0000 mg | ORAL_CAPSULE | Freq: Every day | ORAL | 1 refills | Status: DC

## 2020-02-15 NOTE — Telephone Encounter (Signed)
I am sending this to you only because I didn't want to fill it before patients appt to discuss medications.

## 2020-02-15 NOTE — Addendum Note (Signed)
Addended by: [name] on: 02/15/2020 11:21 AM   Modules accepted: Orders

## 2020-02-15 NOTE — Progress Notes (Signed)
Virtual Visit via Video   I connected with patient on 02/15/20 at 10:30 AM EDT by a video enabled telemedicine application and verified that I am speaking with the correct person using two identifiers.  Location patient: Home Location provider: Acupuncturist, Office Persons participating in the virtual visit: Patient, Provider, King (Jess B)  I discussed the limitations of evaluation and management by telemedicine and the availability of in person appointments. The patient expressed understanding and agreed to proceed.  Subjective:   HPI:   Medication questions- pt is confused by who is now filling her medication since being d/c'd from rehab.  Apparently a [facility] nurse told her that all medications had to come from me.  Pt also wants to know if there is medication she can stop taking at this time  GERD- pt needs refill on Protonix b/c she has had increased sxs since stopping medication in hospital.  Would like to restart.  L leg numbness- pt's gabapentin was stopped during her recent hospitalization (no reason noted) and she reports that the leg pain at night is severe and very bothersome.  She would like to restart her previous regimen.   ROS:   See pertinent positives and negatives per HPI.  Patient Active Problem List   Diagnosis Date Noted  . Colostomy status ([place]) 01/26/2020  . Diverticulitis of colon with perforation 01/26/2020  . Leucocytosis   . Anemia   . Debility 01/14/2020  . Physical debility 01/14/2020  . Trauma   . Postoperative pain   . Urinary retention   . New onset atrial fibrillation ([place])   . Protein-calorie malnutrition, severe 01/13/2020  . Acute renal failure superimposed on stage 3a chronic kidney disease ([place]) 12/29/2019  . Closed fracture of multiple ribs 12/29/2019  . Closed fracture of left distal radius 12/29/2019  . Recurrent falls 12/29/2019  . Positive ANA (antinuclear antibody) 12/29/2019  . Osteoarthritis 11/13/2017  .  Hypertriglyceridemia 01/29/2017  . Excessive gas 01/09/2017  . Preoperative cardiovascular examination 07/31/2016  . Elevated lipase 03/06/2016  . Osteopenia 02/08/2016  . Hypothyroidism 01/26/2016  . HTN (hypertension) 01/26/2016  . Left leg numbness 10/06/2012    Social History   Tobacco Use  . Smoking status: Never Smoker  . Smokeless tobacco: Never Used  Substance Use Topics  . Alcohol use: No    Current Outpatient Medications:  .  acetaminophen (TYLENOL) 325 MG tablet, Take 1-2 tablets (325-650 mg total) by mouth every 4 (four) hours as needed for mild pain., Disp:  , Rfl:  .  amiodarone (PACERONE) 200 MG tablet, Take 1 tablet (200 mg total) by mouth daily., Disp: 30 tablet, Rfl: 0 .  ascorbic acid (VITAMIN C) 250 MG tablet, Take 1 tablet (250 mg total) by mouth 2 (two) times daily., Disp:  , Rfl:  .  [employer] DIGESTIVE PROBIOTIC 250 MG capsule, TAKE 1 CAPSULE (250 MG TOTAL) BY MOUTH 2 (TWO) TIMES DAILY., Disp: 60 capsule, Rfl: 1 .  diltiazem (CARDIZEM CD) 120 MG 24 hr capsule, Take 1 capsule (120 mg total) by mouth daily., Disp: 30 capsule, Rfl: 0 .  docusate sodium (COLACE) 100 MG capsule, Take 1 capsule (100 mg total) by mouth 2 (two) times daily., Disp: 60 capsule, Rfl: 0 .  ELIQUIS 5 MG TABS tablet, TAKE 1 TABLET BY MOUTH TWICE A DAY, Disp: 60 tablet, Rfl: 5 .  levothyroxine (SYNTHROID) 50 MCG tablet, TAKE 1 TABLET BY MOUTH EVERY DAY, Disp: 90 tablet, Rfl: 1 .  metoprolol tartrate (LOPRESSOR) 25 MG  tablet, Take 1 tablet (25 mg total) by mouth 2 (two) times daily., Disp: 60 tablet, Rfl: 0 .  Multiple Vitamins-Minerals (PRESERVISION AREDS 2) CAPS, Take 1 capsule by mouth daily. , Disp: , Rfl:  .  ondansetron (ZOFRAN) 4 MG tablet, Take 1 tablet (4 mg total) by mouth every 8 (eight) hours as needed for nausea or vomiting., Disp: 20 tablet, Rfl: 0 .  pantoprazole (PROTONIX) 40 MG tablet, TAKE 1 TABLET BY MOUTH EVERY DAY (Patient taking differently: Take 40 mg by mouth daily. ), Disp: 90  tablet, Rfl: 1 .  potassium chloride (KLOR-CON) 10 MEQ tablet, Take 1 tablet (10 mEq total) by mouth 2 (two) times daily., Disp: 60 tablet, Rfl: 0 .  predniSONE (DELTASONE) 5 MG tablet, Take 10 mg by mouth daily with breakfast. , Disp: , Rfl:  .  senna-docusate (SENOKOT-S) 8.6-50 MG tablet, Take 1 tablet by mouth at bedtime., Disp: 30 tablet, Rfl: 0 .  vitamin A 10000 UNIT capsule, Take 1 capsule (10,000 Units total) by mouth daily., Disp: 30 capsule, Rfl: 0  Allergies  Allergen Reactions  . Codeine Other (See Comments)    Nightmares, imagining things  . Keflex [Cephalexin] Nausea Only    Pt ended up in ER w/ CP    Objective:   There were no vitals taken for this visit. AAOx3, NAD NCAT, EOMI No obvious CN deficits Coloring WNL Pt is able to speak clearly, coherently without shortness of breath or increased work of breathing.  Thought process is linear.  Mood is appropriate.   Assessment and Plan:   Medication management- reviewed all meds w/ pt and informed her that Cards (Dr Doylene Canard) will be filling her Amiodarone, Dilt, Metoprolol, and Eliquis as these are all related to her Afib.  I will continue to fill her Protonix, Levothyroxine, and Gabapentin.  We discussed that the Colace and Senna are to be used PRN.  She can stop the probiotic if she desires since she has completed her abx.  She can also stop the Vit A, Vit C, and potassium.  We will repeat a BMP in 3-4 weeks to ensure potassium levels remain normal.  Pt expressed understanding and is in agreement w/ plan.   GERD- restart protonix  L leg numbness- restart gabapentin  Afib- medications reviewed and clarified w/ pt.  She is aware to get refills from Cardiology.   Annye Asa, MD 02/15/2020

## 2020-02-15 NOTE — Progress Notes (Signed)
I have discussed the procedure for the virtual visit with the patient who has given consent to proceed with assessment and treatment.   Evanna Washinton L Vonte Rossin, CMA     

## 2020-02-16 DIAGNOSIS — I129 Hypertensive chronic kidney disease with stage 1 through stage 4 chronic kidney disease, or unspecified chronic kidney disease: Secondary | ICD-10-CM | POA: Diagnosis not present

## 2020-02-16 DIAGNOSIS — I480 Paroxysmal atrial fibrillation: Secondary | ICD-10-CM

## 2020-02-16 DIAGNOSIS — G629 Polyneuropathy, unspecified: Secondary | ICD-10-CM

## 2020-02-16 DIAGNOSIS — S2232XD Fracture of one rib, left side, subsequent encounter for fracture with routine healing: Secondary | ICD-10-CM | POA: Diagnosis not present

## 2020-02-16 DIAGNOSIS — I739 Peripheral vascular disease, unspecified: Secondary | ICD-10-CM | POA: Diagnosis not present

## 2020-02-16 DIAGNOSIS — E039 Hypothyroidism, unspecified: Secondary | ICD-10-CM

## 2020-02-16 DIAGNOSIS — K219 Gastro-esophageal reflux disease without esophagitis: Secondary | ICD-10-CM

## 2020-02-16 DIAGNOSIS — Z9181 History of falling: Secondary | ICD-10-CM

## 2020-02-16 DIAGNOSIS — Z7952 Long term (current) use of systemic steroids: Secondary | ICD-10-CM

## 2020-02-16 DIAGNOSIS — M1991 Primary osteoarthritis, unspecified site: Secondary | ICD-10-CM

## 2020-02-16 DIAGNOSIS — N183 Chronic kidney disease, stage 3 unspecified: Secondary | ICD-10-CM | POA: Diagnosis not present

## 2020-02-16 DIAGNOSIS — Z7901 Long term (current) use of anticoagulants: Secondary | ICD-10-CM

## 2020-02-16 DIAGNOSIS — M8580 Other specified disorders of bone density and structure, unspecified site: Secondary | ICD-10-CM

## 2020-02-16 DIAGNOSIS — E43 Unspecified severe protein-calorie malnutrition: Secondary | ICD-10-CM

## 2020-02-16 DIAGNOSIS — K5730 Diverticulosis of large intestine without perforation or abscess without bleeding: Secondary | ICD-10-CM | POA: Diagnosis not present

## 2020-02-16 DIAGNOSIS — Z85820 Personal history of malignant melanoma of skin: Secondary | ICD-10-CM

## 2020-02-16 DIAGNOSIS — Z48815 Encounter for surgical aftercare following surgery on the digestive system: Secondary | ICD-10-CM | POA: Diagnosis not present

## 2020-02-16 DIAGNOSIS — Z431 Encounter for attention to gastrostomy: Secondary | ICD-10-CM

## 2020-02-16 DIAGNOSIS — D631 Anemia in chronic kidney disease: Secondary | ICD-10-CM | POA: Diagnosis not present

## 2020-02-16 DIAGNOSIS — Z4801 Encounter for change or removal of surgical wound dressing: Secondary | ICD-10-CM | POA: Diagnosis not present

## 2020-02-16 DIAGNOSIS — Z86718 Personal history of other venous thrombosis and embolism: Secondary | ICD-10-CM

## 2020-02-16 DIAGNOSIS — S52502D Unspecified fracture of the lower end of left radius, subsequent encounter for closed fracture with routine healing: Secondary | ICD-10-CM | POA: Diagnosis not present

## 2020-02-16 DIAGNOSIS — Z4789 Encounter for other orthopedic aftercare: Secondary | ICD-10-CM

## 2020-02-16 DIAGNOSIS — R339 Retention of urine, unspecified: Secondary | ICD-10-CM

## 2020-02-16 NOTE — Telephone Encounter (Signed)
Form completed and placed in basket  

## 2020-02-17 NOTE — Telephone Encounter (Signed)
FYI

## 2020-02-17 NOTE — Telephone Encounter (Signed)
Picked up from the back, faxed, and sent to scan  

## 2020-02-18 ENCOUNTER — Telehealth: Payer: Self-pay | Admitting: Family Medicine

## 2020-02-18 ENCOUNTER — Encounter: Payer: Self-pay | Admitting: Family Medicine

## 2020-02-18 NOTE — Telephone Encounter (Signed)
Please advise? I am not sure how to change this order as they normally tell us how many times a week they are going to see pt.

## 2020-02-18 NOTE — Telephone Encounter (Signed)
Called and spoke with [facility] and advised of Pt family concern.   Called pt family and advised that [facility] is the one who specifies duration of HH. They stated an understanding and will wait to speak with Melissa with [facility] to see about increasing frequency.

## 2020-02-18 NOTE — Telephone Encounter (Signed)
Pt daughter in law, [name], is calling because [place] was coming out twice a week and is now only coming out once a week, they say per Dr Virgil Benedict orders.  The family is requesting that the nurse come out twice a week.  They would like a call back to discuss this - [phone].

## 2020-02-18 NOTE — Telephone Encounter (Signed)
Can we call [facility] and see if we can increase the home health order to twice weekly.  As you mentioned, they typically dictate the schedule

## 2020-02-20 DIAGNOSIS — S52502D Unspecified fracture of the lower end of left radius, subsequent encounter for closed fracture with routine healing: Secondary | ICD-10-CM | POA: Diagnosis not present

## 2020-02-20 DIAGNOSIS — S2232XD Fracture of one rib, left side, subsequent encounter for fracture with routine healing: Secondary | ICD-10-CM | POA: Diagnosis not present

## 2020-02-20 DIAGNOSIS — I129 Hypertensive chronic kidney disease with stage 1 through stage 4 chronic kidney disease, or unspecified chronic kidney disease: Secondary | ICD-10-CM | POA: Diagnosis not present

## 2020-02-20 DIAGNOSIS — Z48815 Encounter for surgical aftercare following surgery on the digestive system: Secondary | ICD-10-CM | POA: Diagnosis not present

## 2020-02-20 DIAGNOSIS — N183 Chronic kidney disease, stage 3 unspecified: Secondary | ICD-10-CM | POA: Diagnosis not present

## 2020-02-20 DIAGNOSIS — Z4801 Encounter for change or removal of surgical wound dressing: Secondary | ICD-10-CM | POA: Diagnosis not present

## 2020-02-20 DIAGNOSIS — I739 Peripheral vascular disease, unspecified: Secondary | ICD-10-CM | POA: Diagnosis not present

## 2020-02-20 DIAGNOSIS — D631 Anemia in chronic kidney disease: Secondary | ICD-10-CM | POA: Diagnosis not present

## 2020-02-20 DIAGNOSIS — K5730 Diverticulosis of large intestine without perforation or abscess without bleeding: Secondary | ICD-10-CM | POA: Diagnosis not present

## 2020-02-21 DIAGNOSIS — I129 Hypertensive chronic kidney disease with stage 1 through stage 4 chronic kidney disease, or unspecified chronic kidney disease: Secondary | ICD-10-CM | POA: Diagnosis not present

## 2020-02-21 DIAGNOSIS — Z4801 Encounter for change or removal of surgical wound dressing: Secondary | ICD-10-CM | POA: Diagnosis not present

## 2020-02-21 DIAGNOSIS — D631 Anemia in chronic kidney disease: Secondary | ICD-10-CM | POA: Diagnosis not present

## 2020-02-21 DIAGNOSIS — Z48815 Encounter for surgical aftercare following surgery on the digestive system: Secondary | ICD-10-CM | POA: Diagnosis not present

## 2020-02-21 DIAGNOSIS — S2232XD Fracture of one rib, left side, subsequent encounter for fracture with routine healing: Secondary | ICD-10-CM | POA: Diagnosis not present

## 2020-02-21 DIAGNOSIS — N183 Chronic kidney disease, stage 3 unspecified: Secondary | ICD-10-CM | POA: Diagnosis not present

## 2020-02-21 DIAGNOSIS — K5730 Diverticulosis of large intestine without perforation or abscess without bleeding: Secondary | ICD-10-CM | POA: Diagnosis not present

## 2020-02-21 DIAGNOSIS — I739 Peripheral vascular disease, unspecified: Secondary | ICD-10-CM | POA: Diagnosis not present

## 2020-02-21 DIAGNOSIS — S52502D Unspecified fracture of the lower end of left radius, subsequent encounter for closed fracture with routine healing: Secondary | ICD-10-CM | POA: Diagnosis not present

## 2020-02-22 DIAGNOSIS — S52502D Unspecified fracture of the lower end of left radius, subsequent encounter for closed fracture with routine healing: Secondary | ICD-10-CM | POA: Diagnosis not present

## 2020-02-22 DIAGNOSIS — Z48815 Encounter for surgical aftercare following surgery on the digestive system: Secondary | ICD-10-CM | POA: Diagnosis not present

## 2020-02-22 DIAGNOSIS — S2232XD Fracture of one rib, left side, subsequent encounter for fracture with routine healing: Secondary | ICD-10-CM | POA: Diagnosis not present

## 2020-02-22 DIAGNOSIS — I129 Hypertensive chronic kidney disease with stage 1 through stage 4 chronic kidney disease, or unspecified chronic kidney disease: Secondary | ICD-10-CM | POA: Diagnosis not present

## 2020-02-22 DIAGNOSIS — I739 Peripheral vascular disease, unspecified: Secondary | ICD-10-CM | POA: Diagnosis not present

## 2020-02-22 DIAGNOSIS — Z4801 Encounter for change or removal of surgical wound dressing: Secondary | ICD-10-CM | POA: Diagnosis not present

## 2020-02-22 DIAGNOSIS — D631 Anemia in chronic kidney disease: Secondary | ICD-10-CM | POA: Diagnosis not present

## 2020-02-22 DIAGNOSIS — N183 Chronic kidney disease, stage 3 unspecified: Secondary | ICD-10-CM | POA: Diagnosis not present

## 2020-02-22 DIAGNOSIS — K5730 Diverticulosis of large intestine without perforation or abscess without bleeding: Secondary | ICD-10-CM | POA: Diagnosis not present

## 2020-02-23 DIAGNOSIS — N183 Chronic kidney disease, stage 3 unspecified: Secondary | ICD-10-CM | POA: Diagnosis not present

## 2020-02-23 DIAGNOSIS — Z48815 Encounter for surgical aftercare following surgery on the digestive system: Secondary | ICD-10-CM | POA: Diagnosis not present

## 2020-02-23 DIAGNOSIS — K5730 Diverticulosis of large intestine without perforation or abscess without bleeding: Secondary | ICD-10-CM | POA: Diagnosis not present

## 2020-02-23 DIAGNOSIS — S2232XD Fracture of one rib, left side, subsequent encounter for fracture with routine healing: Secondary | ICD-10-CM | POA: Diagnosis not present

## 2020-02-23 DIAGNOSIS — D631 Anemia in chronic kidney disease: Secondary | ICD-10-CM | POA: Diagnosis not present

## 2020-02-23 DIAGNOSIS — I129 Hypertensive chronic kidney disease with stage 1 through stage 4 chronic kidney disease, or unspecified chronic kidney disease: Secondary | ICD-10-CM | POA: Diagnosis not present

## 2020-02-23 DIAGNOSIS — S52502D Unspecified fracture of the lower end of left radius, subsequent encounter for closed fracture with routine healing: Secondary | ICD-10-CM | POA: Diagnosis not present

## 2020-02-23 DIAGNOSIS — I739 Peripheral vascular disease, unspecified: Secondary | ICD-10-CM | POA: Diagnosis not present

## 2020-02-23 DIAGNOSIS — Z4801 Encounter for change or removal of surgical wound dressing: Secondary | ICD-10-CM | POA: Diagnosis not present

## 2020-02-24 DIAGNOSIS — I129 Hypertensive chronic kidney disease with stage 1 through stage 4 chronic kidney disease, or unspecified chronic kidney disease: Secondary | ICD-10-CM | POA: Diagnosis not present

## 2020-02-24 DIAGNOSIS — S52502D Unspecified fracture of the lower end of left radius, subsequent encounter for closed fracture with routine healing: Secondary | ICD-10-CM | POA: Diagnosis not present

## 2020-02-24 DIAGNOSIS — S2232XD Fracture of one rib, left side, subsequent encounter for fracture with routine healing: Secondary | ICD-10-CM | POA: Diagnosis not present

## 2020-02-24 DIAGNOSIS — K5730 Diverticulosis of large intestine without perforation or abscess without bleeding: Secondary | ICD-10-CM | POA: Diagnosis not present

## 2020-02-24 DIAGNOSIS — D631 Anemia in chronic kidney disease: Secondary | ICD-10-CM | POA: Diagnosis not present

## 2020-02-24 DIAGNOSIS — Z48815 Encounter for surgical aftercare following surgery on the digestive system: Secondary | ICD-10-CM | POA: Diagnosis not present

## 2020-02-24 DIAGNOSIS — N183 Chronic kidney disease, stage 3 unspecified: Secondary | ICD-10-CM | POA: Diagnosis not present

## 2020-02-24 DIAGNOSIS — Z4801 Encounter for change or removal of surgical wound dressing: Secondary | ICD-10-CM | POA: Diagnosis not present

## 2020-02-24 DIAGNOSIS — I739 Peripheral vascular disease, unspecified: Secondary | ICD-10-CM | POA: Diagnosis not present

## 2020-02-27 DIAGNOSIS — I739 Peripheral vascular disease, unspecified: Secondary | ICD-10-CM | POA: Diagnosis not present

## 2020-02-27 DIAGNOSIS — S2232XD Fracture of one rib, left side, subsequent encounter for fracture with routine healing: Secondary | ICD-10-CM | POA: Diagnosis not present

## 2020-02-27 DIAGNOSIS — Z48815 Encounter for surgical aftercare following surgery on the digestive system: Secondary | ICD-10-CM | POA: Diagnosis not present

## 2020-02-27 DIAGNOSIS — N183 Chronic kidney disease, stage 3 unspecified: Secondary | ICD-10-CM | POA: Diagnosis not present

## 2020-02-27 DIAGNOSIS — S52502D Unspecified fracture of the lower end of left radius, subsequent encounter for closed fracture with routine healing: Secondary | ICD-10-CM | POA: Diagnosis not present

## 2020-02-27 DIAGNOSIS — K5730 Diverticulosis of large intestine without perforation or abscess without bleeding: Secondary | ICD-10-CM | POA: Diagnosis not present

## 2020-02-27 DIAGNOSIS — I129 Hypertensive chronic kidney disease with stage 1 through stage 4 chronic kidney disease, or unspecified chronic kidney disease: Secondary | ICD-10-CM | POA: Diagnosis not present

## 2020-02-27 DIAGNOSIS — D631 Anemia in chronic kidney disease: Secondary | ICD-10-CM | POA: Diagnosis not present

## 2020-02-27 DIAGNOSIS — Z4801 Encounter for change or removal of surgical wound dressing: Secondary | ICD-10-CM | POA: Diagnosis not present

## 2020-02-29 ENCOUNTER — Other Ambulatory Visit: Payer: Self-pay | Admitting: Physical Medicine and Rehabilitation

## 2020-02-29 DIAGNOSIS — S2232XD Fracture of one rib, left side, subsequent encounter for fracture with routine healing: Secondary | ICD-10-CM | POA: Diagnosis not present

## 2020-02-29 DIAGNOSIS — S52502D Unspecified fracture of the lower end of left radius, subsequent encounter for closed fracture with routine healing: Secondary | ICD-10-CM | POA: Diagnosis not present

## 2020-02-29 DIAGNOSIS — K5730 Diverticulosis of large intestine without perforation or abscess without bleeding: Secondary | ICD-10-CM | POA: Diagnosis not present

## 2020-02-29 DIAGNOSIS — N183 Chronic kidney disease, stage 3 unspecified: Secondary | ICD-10-CM | POA: Diagnosis not present

## 2020-02-29 DIAGNOSIS — I739 Peripheral vascular disease, unspecified: Secondary | ICD-10-CM | POA: Diagnosis not present

## 2020-02-29 DIAGNOSIS — D631 Anemia in chronic kidney disease: Secondary | ICD-10-CM | POA: Diagnosis not present

## 2020-02-29 DIAGNOSIS — Z48815 Encounter for surgical aftercare following surgery on the digestive system: Secondary | ICD-10-CM | POA: Diagnosis not present

## 2020-02-29 DIAGNOSIS — Z4801 Encounter for change or removal of surgical wound dressing: Secondary | ICD-10-CM | POA: Diagnosis not present

## 2020-02-29 DIAGNOSIS — I129 Hypertensive chronic kidney disease with stage 1 through stage 4 chronic kidney disease, or unspecified chronic kidney disease: Secondary | ICD-10-CM | POA: Diagnosis not present

## 2020-03-01 DIAGNOSIS — K5730 Diverticulosis of large intestine without perforation or abscess without bleeding: Secondary | ICD-10-CM | POA: Diagnosis not present

## 2020-03-01 DIAGNOSIS — I129 Hypertensive chronic kidney disease with stage 1 through stage 4 chronic kidney disease, or unspecified chronic kidney disease: Secondary | ICD-10-CM | POA: Diagnosis not present

## 2020-03-01 DIAGNOSIS — S2232XD Fracture of one rib, left side, subsequent encounter for fracture with routine healing: Secondary | ICD-10-CM | POA: Diagnosis not present

## 2020-03-01 DIAGNOSIS — N183 Chronic kidney disease, stage 3 unspecified: Secondary | ICD-10-CM | POA: Diagnosis not present

## 2020-03-01 DIAGNOSIS — S52502D Unspecified fracture of the lower end of left radius, subsequent encounter for closed fracture with routine healing: Secondary | ICD-10-CM | POA: Diagnosis not present

## 2020-03-01 DIAGNOSIS — D631 Anemia in chronic kidney disease: Secondary | ICD-10-CM | POA: Diagnosis not present

## 2020-03-01 DIAGNOSIS — Z48815 Encounter for surgical aftercare following surgery on the digestive system: Secondary | ICD-10-CM | POA: Diagnosis not present

## 2020-03-01 DIAGNOSIS — I739 Peripheral vascular disease, unspecified: Secondary | ICD-10-CM | POA: Diagnosis not present

## 2020-03-01 DIAGNOSIS — Z4801 Encounter for change or removal of surgical wound dressing: Secondary | ICD-10-CM | POA: Diagnosis not present

## 2020-03-02 DIAGNOSIS — S52502D Unspecified fracture of the lower end of left radius, subsequent encounter for closed fracture with routine healing: Secondary | ICD-10-CM | POA: Diagnosis not present

## 2020-03-02 DIAGNOSIS — D631 Anemia in chronic kidney disease: Secondary | ICD-10-CM | POA: Diagnosis not present

## 2020-03-02 DIAGNOSIS — Z48815 Encounter for surgical aftercare following surgery on the digestive system: Secondary | ICD-10-CM | POA: Diagnosis not present

## 2020-03-02 DIAGNOSIS — I129 Hypertensive chronic kidney disease with stage 1 through stage 4 chronic kidney disease, or unspecified chronic kidney disease: Secondary | ICD-10-CM | POA: Diagnosis not present

## 2020-03-02 DIAGNOSIS — Z4801 Encounter for change or removal of surgical wound dressing: Secondary | ICD-10-CM | POA: Diagnosis not present

## 2020-03-02 DIAGNOSIS — I739 Peripheral vascular disease, unspecified: Secondary | ICD-10-CM | POA: Diagnosis not present

## 2020-03-02 DIAGNOSIS — K5730 Diverticulosis of large intestine without perforation or abscess without bleeding: Secondary | ICD-10-CM | POA: Diagnosis not present

## 2020-03-02 DIAGNOSIS — N183 Chronic kidney disease, stage 3 unspecified: Secondary | ICD-10-CM | POA: Diagnosis not present

## 2020-03-02 DIAGNOSIS — S2232XD Fracture of one rib, left side, subsequent encounter for fracture with routine healing: Secondary | ICD-10-CM | POA: Diagnosis not present

## 2020-03-03 ENCOUNTER — Other Ambulatory Visit: Payer: Self-pay | Admitting: Family Medicine

## 2020-03-03 ENCOUNTER — Telehealth: Payer: Self-pay

## 2020-03-03 DIAGNOSIS — S2232XD Fracture of one rib, left side, subsequent encounter for fracture with routine healing: Secondary | ICD-10-CM | POA: Diagnosis not present

## 2020-03-03 DIAGNOSIS — I739 Peripheral vascular disease, unspecified: Secondary | ICD-10-CM | POA: Diagnosis not present

## 2020-03-03 DIAGNOSIS — Z4801 Encounter for change or removal of surgical wound dressing: Secondary | ICD-10-CM | POA: Diagnosis not present

## 2020-03-03 DIAGNOSIS — S52502D Unspecified fracture of the lower end of left radius, subsequent encounter for closed fracture with routine healing: Secondary | ICD-10-CM | POA: Diagnosis not present

## 2020-03-03 DIAGNOSIS — I129 Hypertensive chronic kidney disease with stage 1 through stage 4 chronic kidney disease, or unspecified chronic kidney disease: Secondary | ICD-10-CM | POA: Diagnosis not present

## 2020-03-03 DIAGNOSIS — Z48815 Encounter for surgical aftercare following surgery on the digestive system: Secondary | ICD-10-CM | POA: Diagnosis not present

## 2020-03-03 DIAGNOSIS — K5730 Diverticulosis of large intestine without perforation or abscess without bleeding: Secondary | ICD-10-CM | POA: Diagnosis not present

## 2020-03-03 DIAGNOSIS — N183 Chronic kidney disease, stage 3 unspecified: Secondary | ICD-10-CM | POA: Diagnosis not present

## 2020-03-03 DIAGNOSIS — D631 Anemia in chronic kidney disease: Secondary | ICD-10-CM | POA: Diagnosis not present

## 2020-03-03 NOTE — Telephone Encounter (Signed)
Patient called in stating that her left leg that she has the numbness in has developed "red splotches" on it and it has a tight feeling. She states it is not swollen, just feels tight. She denies any pain in the area. She states she wanted you to know because this is new for her. Patient would like a call back.

## 2020-03-03 NOTE — Telephone Encounter (Signed)
Please advise 

## 2020-03-03 NOTE — Telephone Encounter (Signed)
Pt states that her Gabapentin has been stopped for at least 2 weeks by the heart doctor. Due to ankle swelling. Pt has been elevating the leg all day and states that it does feel tight. The red splotches are smooth, almost like a purplish red color (like a bruise) about quarter size on the left leg and the others (about 6 are a lot smaller), Leg is not tender to the touch, no heat to the area.

## 2020-03-03 NOTE — Telephone Encounter (Signed)
The L leg numbness is ongoing for her and we recently restarted her gabapentin.  If the leg is feeling tight, she needs to elevate it while sitting.  I need more information about the 'red splotches'- are they blisters?  Tender?  I want her to monitor over the weekend.  If still having issues on Monday we will need to assess in office.  If she develops pain, marked swelling, or other concerns over the weekend she should go to the ER

## 2020-03-03 NOTE — Telephone Encounter (Signed)
Patient notified of PCP recommendations and is agreement and expresses an understanding. Warned of symptoms that should warrant a trip to ED. Pt scheduled with PCP on Monday morning.

## 2020-03-03 NOTE — Telephone Encounter (Signed)
Patient notified of PCP recommendations and is agreement and expresses an understanding. Pt given warning symptoms to look out for that would warrant trip to ED. Pt scheduled with PCP on Monday.

## 2020-03-03 NOTE — Telephone Encounter (Signed)
Sharyn Lull, Physical therapist with [facility] called stating the patient had 2 high blood pressure reading. The first was before exercise 160/84. The second was after exercise 144/96. She also states the patient is just not feeling well and her heart rate was a little more erratic than what she is use to. Sharyn Lull can be reached at [phone].

## 2020-03-03 NOTE — Telephone Encounter (Signed)
Please advise, note says to stop taking at discharge.

## 2020-03-03 NOTE — Telephone Encounter (Signed)
Again, I'm limited in my assessment since I have not seen this, but I want her to monitor over the weekend and we can see her on Monday if symptoms continue.  Or she needs to call Cardiology b/c swelling, elevated BP, and erratic HR are all something they need to be aware of

## 2020-03-03 NOTE — Telephone Encounter (Signed)
If elevated BP continues and heart rate remains erratic this must be addressed w/ cardiology

## 2020-03-06 ENCOUNTER — Encounter: Payer: Self-pay | Admitting: Family Medicine

## 2020-03-06 ENCOUNTER — Other Ambulatory Visit: Payer: Self-pay

## 2020-03-06 ENCOUNTER — Ambulatory Visit: Payer: Medicare PPO | Admitting: Family Medicine

## 2020-03-06 VITALS — BP 150/86 | HR 80 | Temp 97.0°F | Resp 15 | Ht 67.0 in | Wt 152.0 lb

## 2020-03-06 DIAGNOSIS — I739 Peripheral vascular disease, unspecified: Secondary | ICD-10-CM | POA: Diagnosis not present

## 2020-03-06 DIAGNOSIS — I129 Hypertensive chronic kidney disease with stage 1 through stage 4 chronic kidney disease, or unspecified chronic kidney disease: Secondary | ICD-10-CM | POA: Diagnosis not present

## 2020-03-06 DIAGNOSIS — I4891 Unspecified atrial fibrillation: Secondary | ICD-10-CM

## 2020-03-06 DIAGNOSIS — S52502D Unspecified fracture of the lower end of left radius, subsequent encounter for closed fracture with routine healing: Secondary | ICD-10-CM | POA: Diagnosis not present

## 2020-03-06 DIAGNOSIS — R200 Anesthesia of skin: Secondary | ICD-10-CM

## 2020-03-06 DIAGNOSIS — Z4801 Encounter for change or removal of surgical wound dressing: Secondary | ICD-10-CM | POA: Diagnosis not present

## 2020-03-06 DIAGNOSIS — I10 Essential (primary) hypertension: Secondary | ICD-10-CM | POA: Diagnosis not present

## 2020-03-06 DIAGNOSIS — K5730 Diverticulosis of large intestine without perforation or abscess without bleeding: Secondary | ICD-10-CM | POA: Diagnosis not present

## 2020-03-06 DIAGNOSIS — D631 Anemia in chronic kidney disease: Secondary | ICD-10-CM | POA: Diagnosis not present

## 2020-03-06 DIAGNOSIS — S2232XD Fracture of one rib, left side, subsequent encounter for fracture with routine healing: Secondary | ICD-10-CM | POA: Diagnosis not present

## 2020-03-06 DIAGNOSIS — N183 Chronic kidney disease, stage 3 unspecified: Secondary | ICD-10-CM | POA: Diagnosis not present

## 2020-03-06 DIAGNOSIS — Z48815 Encounter for surgical aftercare following surgery on the digestive system: Secondary | ICD-10-CM | POA: Diagnosis not present

## 2020-03-06 MED ORDER — DILTIAZEM HCL ER 180 MG PO CP24: 180.0000 mg | ORAL_CAPSULE | Freq: Every day | ORAL | 3 refills | Status: DC

## 2020-03-06 NOTE — Assessment & Plan Note (Signed)
Pt continues to have irregular rhythm today.  Discussed that swelling and light headedness in the setting of Afib are sxs that need to be discussed w/ Cards.  Pt and family do not feel comfortable w/ current cardiologist and would like a new referral.  Referral to Afib clinic placed.  Pt and family are in agreement w/ this

## 2020-03-06 NOTE — Assessment & Plan Note (Signed)
Ongoing issue for pt.  Given her pain and decreased quality of life, will restart Gabapentin but at 300mg  rather than 600mg  nightly.  Discussed that this can cause swelling but she didn't have swelling for the many years that she has been on this prior to her hospitalization.  If she tolerates the 300mg  w/o difficulty we will consider titrating based on sxs.  Pt expressed understanding and is in agreement w/ plan.

## 2020-03-06 NOTE — Assessment & Plan Note (Signed)
Deteriorated.  BP readings are higher today than previously.  Will increase Diltiazem while awaiting Cardiology appt for 2nd opinion.  Pt expressed understanding and is in agreement w/ plan.

## 2020-03-06 NOTE — Patient Instructions (Signed)
Follow up by phone or MyChart in 2 weeks and let me know how the leg pain is doing RESTART the Gabapentin 300mg  (1 pill) nightly CHANGE the Diltiazem to 180mg  daily (I decided to compromise and go in between the 120mg  and 240mg  dose- new prescription sent) Continue to keep your legs elevated when sitting We'll call you with your Afib Clinic appt Call with any questions or concerns Hang in there!

## 2020-03-06 NOTE — Progress Notes (Signed)
   Subjective:    Patient ID: [name], female    DOB: August 08, 1923, 86 y.o.   MRN: [id number]  HPI HTN- chronic problem, on Dilt 120mg  daily, Metoprolol 25mg  BID.  Home BPs running 130-170/70-80s.  Pt reports she has had increased leg swelling towards the end of last week but this improved over the weekend.  L leg swells considerably more than R.  Pt reports leg will 'draw and tighten' intermittently.  Sxs of drawing and tightening worsened after cards stopped Gabapentin (2 weeks ago).  Gabapentin was stopped due to concern for bilateral leg swelling and some 'light headedness'.     Review of Systems For ROS see HPI   This visit occurred during the SARS-CoV-2 public health emergency.  Safety protocols were in place, including screening questions prior to the visit, additional usage of staff PPE, and extensive cleaning of exam room while observing appropriate contact time as indicated for disinfecting solutions.       Objective:   Physical Exam Vitals reviewed.  Constitutional:      General: She is not in acute distress.    Appearance: She is well-developed.  HENT:     Head: Normocephalic and atraumatic.  Eyes:     Conjunctiva/sclera: Conjunctivae normal.     Pupils: Pupils are equal, round, and reactive to light.  Neck:     Thyroid: No thyromegaly.  Cardiovascular:     Rate and Rhythm: Normal rate. Rhythm irregular.     Heart sounds: Normal heart sounds. No murmur.  Pulmonary:     Effort: Pulmonary effort is normal. No respiratory distress.     Breath sounds: Normal breath sounds.  Abdominal:     General: There is no distension.     Palpations: Abdomen is soft.     Tenderness: There is no abdominal tenderness.  Musculoskeletal:     Cervical back: Normal range of motion and neck supple.     Right lower leg: No edema.     Left lower leg: Edema (trace) present.  Lymphadenopathy:     Cervical: No cervical adenopathy.  Skin:    General: Skin is warm and dry.  Neurological:      Mental Status: She is alert and oriented to person, place, and time.  Psychiatric:        Behavior: Behavior normal.           Assessment & Plan:

## 2020-03-08 ENCOUNTER — Ambulatory Visit (HOSPITAL_COMMUNITY): Payer: Medicare PPO | Admitting: Physician Assistant

## 2020-03-08 ENCOUNTER — Other Ambulatory Visit: Payer: Self-pay

## 2020-03-08 ENCOUNTER — Ambulatory Visit (HOSPITAL_COMMUNITY)
Admission: RE | Admit: 2020-03-08 | Discharge: 2020-03-08 | Disposition: A | Discharge status: Home / Self Care | Payer: Medicare PPO | Source: Ambulatory Visit | Attending: Physician Assistant | Admitting: Physician Assistant

## 2020-03-08 ENCOUNTER — Encounter (HOSPITAL_COMMUNITY): Payer: Self-pay | Admitting: Physician Assistant

## 2020-03-08 VITALS — BP 104/66 | HR 65 | Ht 67.0 in | Wt 153.2 lb

## 2020-03-08 DIAGNOSIS — Z85820 Personal history of malignant melanoma of skin: Secondary | ICD-10-CM | POA: Insufficient documentation

## 2020-03-08 DIAGNOSIS — M353 Polymyalgia rheumatica: Secondary | ICD-10-CM | POA: Insufficient documentation

## 2020-03-08 DIAGNOSIS — D6869 Other thrombophilia: Secondary | ICD-10-CM | POA: Diagnosis not present

## 2020-03-08 DIAGNOSIS — I4819 Other persistent atrial fibrillation: Secondary | ICD-10-CM | POA: Insufficient documentation

## 2020-03-08 DIAGNOSIS — Z803 Family history of malignant neoplasm of breast: Secondary | ICD-10-CM | POA: Diagnosis not present

## 2020-03-08 DIAGNOSIS — Z86718 Personal history of other venous thrombosis and embolism: Secondary | ICD-10-CM | POA: Insufficient documentation

## 2020-03-08 DIAGNOSIS — Z885 Allergy status to narcotic agent status: Secondary | ICD-10-CM | POA: Diagnosis not present

## 2020-03-08 DIAGNOSIS — N189 Chronic kidney disease, unspecified: Secondary | ICD-10-CM | POA: Insufficient documentation

## 2020-03-08 DIAGNOSIS — Z7901 Long term (current) use of anticoagulants: Secondary | ICD-10-CM | POA: Diagnosis not present

## 2020-03-08 DIAGNOSIS — I129 Hypertensive chronic kidney disease with stage 1 through stage 4 chronic kidney disease, or unspecified chronic kidney disease: Secondary | ICD-10-CM | POA: Insufficient documentation

## 2020-03-08 DIAGNOSIS — Z79890 Hormone replacement therapy (postmenopausal): Secondary | ICD-10-CM | POA: Diagnosis not present

## 2020-03-08 DIAGNOSIS — E039 Hypothyroidism, unspecified: Secondary | ICD-10-CM | POA: Diagnosis not present

## 2020-03-08 DIAGNOSIS — Z825 Family history of asthma and other chronic lower respiratory diseases: Secondary | ICD-10-CM | POA: Insufficient documentation

## 2020-03-08 DIAGNOSIS — I4891 Unspecified atrial fibrillation: Secondary | ICD-10-CM | POA: Diagnosis present

## 2020-03-08 DIAGNOSIS — K219 Gastro-esophageal reflux disease without esophagitis: Secondary | ICD-10-CM | POA: Insufficient documentation

## 2020-03-08 DIAGNOSIS — G629 Polyneuropathy, unspecified: Secondary | ICD-10-CM | POA: Insufficient documentation

## 2020-03-08 DIAGNOSIS — Z79899 Other long term (current) drug therapy: Secondary | ICD-10-CM | POA: Insufficient documentation

## 2020-03-08 DIAGNOSIS — Z881 Allergy status to other antibiotic agents status: Secondary | ICD-10-CM | POA: Diagnosis not present

## 2020-03-08 MED ORDER — AMIODARONE HCL 200 MG PO TABS: 100.0000 mg | ORAL_TABLET | Freq: Every day | ORAL | 1 refills | Status: DC

## 2020-03-08 NOTE — Progress Notes (Signed)
Primary Care Physician: [name], MD Primary Cardiologist: none Primary Electrophysiologist: none Referring Physician: Dr Sandi Mealy is a 86 y.o. female with a history of HTN, hypothyroidism, CKD, polymyalgia rheumatica, and persistent atrial fibrillation who presents for consultation in the [facility].  The patient was initially diagnosed with atrial fibrillation 01/07/20 in the setting of a radial fracture after a fall requiring ORIF and also a perforated diverticulum requiring sigmoid colectomy. She was started on amiodarone, metoprolol, diltiazem, and Eliquis for a CHADS2VASC score of 4. Patient reports since discharge, she is slowly regaining her strength. She denies any heart racing or palpitations. She is tolerating the medication without difficulty and denies bleeding issues on anticoagulation.   Today, she denies symptoms of palpitations, chest pain, shortness of breath, orthopnea, PND, lower extremity edema, dizziness, presyncope, syncope, snoring, daytime somnolence, bleeding, or neurologic sequela. The patient is tolerating medications without difficulties and is otherwise without complaint today.    Atrial Fibrillation Risk Factors:  she does not have symptoms or diagnosis of sleep apnea. she does not have a history of rheumatic fever. she does not have a history of alcohol use. The patient does have a history of early familial atrial fibrillation or other arrhythmias. Sister has afib.  she has a BMI of Body mass index is 23.99 kg/m.Marland Kitchen Filed Weights   03/08/20 1422  Weight: 69.5 kg    Family History  Problem Relation Age of Onset  . Cancer Mother        BREAST  . COPD Father   . Emphysema Father   . Cancer Daughter        breast     Atrial Fibrillation Management history:  Previous antiarrhythmic drugs: amiodarone Previous cardioversions: none Previous ablations: none CHADS2VASC score: 4 Anticoagulation  history: Eliquis   Past Medical History:  Diagnosis Date  . DVT (deep venous thrombosis) ([place]) 09/2015   RLE  . GERD (gastroesophageal reflux disease)   . Hypertension   . Hypothyroidism   . Melanoma ([place]) 06/16/2017   Facial melanoma, removed by Dr. Harvel Quale  . Peripheral vascular disease ([place])    peripheral neuropathy   Past Surgical History:  Procedure Laterality Date  . ABDOMINAL HYSTERECTOMY    . APPENDECTOMY    . BACK SURGERY    . BREAST SURGERY     left breast biopsy  . COLOSTOMY N/A 01/06/2020   Procedure: Colostomy;  Surgeon: Greer Pickerel, MD;  Location: [place];  Service: General;  Laterality: N/A;  . EYE SURGERY     cataract  . HARDWARE REMOVAL Left 01/01/2020   Procedure: HARDWARE REMOVAL;  Surgeon: Iran Planas, MD;  Location: [place];  Service: Orthopedics;  Laterality: Left;  . LAPAROTOMY N/A 01/06/2020   Procedure: Exploratory Laparotomy, Open Sigmoid colectomy;  Surgeon: Greer Pickerel, MD;  Location: [place];  Service: General;  Laterality: N/A;  . LIPOMA EXCISION Left 04/25/2015   Procedure: EXCISION OF LIPOMA LEFT ARM;  Surgeon: Donnie Mesa, MD;  Location: [place];  Service: General;  Laterality: Left;  . LUMBAR LAMINECTOMY/DECOMPRESSION MICRODISCECTOMY  11/20/2011   Procedure: LUMBAR LAMINECTOMY/DECOMPRESSION MICRODISCECTOMY;  Surgeon: Jeneen Rinks P [place];  Location: [facility];  Service: Orthopedics;  Laterality: N/A;  Decompressive Laminectomy L2 to the Sacrum (X-Ray)  . OPEN REDUCTION INTERNAL FIXATION (ORIF) DISTAL RADIAL FRACTURE Left 01/01/2020   Procedure: OPEN REDUCTION INTERNAL FIXATION (ORIF) DISTAL RADIAL FRACTURE;  Surgeon: Iran Planas, MD;  Location: [place];  Service: Orthopedics;  Laterality: Left;  . OTHER SURGICAL HISTORY     left wrist surgery - has plate in left wrist   . OTHER SURGICAL HISTORY     right knee surgery due to torn cartilage   . TONSILLECTOMY    . WRIST FRACTURE SURGERY Left     Current Outpatient Medications  Medication  Sig Dispense Refill  . acetaminophen (TYLENOL) 325 MG tablet Take 1-2 tablets (325-650 mg total) by mouth every 4 (four) hours as needed for mild pain.    Marland Kitchen diltiazem (DILACOR XR) 180 MG 24 hr capsule Take 1 capsule (180 mg total) by mouth daily. 30 capsule 3  . docusate sodium (COLACE) 100 MG capsule Take 1 capsule (100 mg total) by mouth 2 (two) times daily. 60 capsule 0  . ELIQUIS 5 MG TABS tablet TAKE 1 TABLET BY MOUTH TWICE A DAY 60 tablet 5  . gabapentin (NEURONTIN) 300 MG capsule Take 2 capsules (600 mg total) by mouth at bedtime. (Patient taking differently: Take 300 mg by mouth at bedtime. ) 180 capsule 1  . levothyroxine (SYNTHROID) 50 MCG tablet TAKE 1 TABLET BY MOUTH EVERY DAY 90 tablet 1  . metoprolol tartrate (LOPRESSOR) 25 MG tablet Take 1 tablet (25 mg total) by mouth 2 (two) times daily. 60 tablet 0  . Multiple Vitamins-Minerals (PRESERVISION AREDS 2) CAPS Take 1 capsule by mouth daily.     . ondansetron (ZOFRAN) 4 MG tablet Take 1 tablet (4 mg total) by mouth every 8 (eight) hours as needed for nausea or vomiting. 20 tablet 0  . pantoprazole (PROTONIX) 40 MG tablet Take 1 tablet (40 mg total) by mouth daily. 90 tablet 1  . predniSONE (DELTASONE) 5 MG tablet Take 10 mg by mouth daily with breakfast.     . senna-docusate (SENOKOT-S) 8.6-50 MG tablet Take 1 tablet by mouth at bedtime. 30 tablet 0  . amiodarone (PACERONE) 200 MG tablet Take 0.5 tablets (100 mg total) by mouth daily. 45 tablet 1   No current facility-administered medications for this encounter.    Allergies  Allergen Reactions  . Codeine Other (See Comments)    Nightmares, imagining things  . Keflex [Cephalexin] Nausea Only    Pt ended up in ER w/ CP    Social History   Socioeconomic History  . Marital status: Widowed    Spouse name: Not on file  . Number of children: Not on file  . Years of education: Not on file  . Highest education level: Not on file  Occupational History  . Not on file  Tobacco  Use  . Smoking status: Never Smoker  . Smokeless tobacco: Never Used  Substance and Sexual Activity  . Alcohol use: No  . Drug use: No  . Sexual activity: Never  Other Topics Concern  . Not on file  Social History Narrative  . Not on file   Social Determinants of Health   Financial Resource Strain:   . Difficulty of Paying Living Expenses:   Food Insecurity:   . Worried About Charity fundraiser in the Last Year:   . Arboriculturist in the Last Year:   Transportation Needs:   . Film/video editor (Medical):   Marland Kitchen Lack of Transportation (Non-Medical):   Physical Activity:   . Days of Exercise per Week:   . Minutes of Exercise per Session:   Stress:   . Feeling of Stress :   Social Connections:   . Frequency of Communication with Friends and  Family:   . Frequency of Social Gatherings with Friends and Family:   . Attends Religious Services:   . Active Member of Clubs or Organizations:   . Attends Archivist Meetings:   Marland Kitchen Marital Status:   Intimate Partner Violence:   . Fear of Current or Ex-Partner:   . Emotionally Abused:   Marland Kitchen Physically Abused:   . Sexually Abused:      ROS- All systems are reviewed and negative except as per the HPI above.  Physical Exam: Vitals:   03/08/20 1422  BP: 104/66  Pulse: 65  Weight: 69.5 kg  Height: 5\' 7"  (1.702 m)    GEN- The patient is well appearing elderly female, alert and oriented x 3 today.   Head- normocephalic, atraumatic Eyes-  Sclera clear, conjunctiva pink Ears- hearing intact Oropharynx- clear Neck- supple  Lungs- Clear to ausculation bilaterally, normal work of breathing Heart- irregular rate and rhythm, no murmurs, rubs or gallops  GI- soft, NT, ND, + BS Extremities- no clubbing, cyanosis. Trace ankle edema, L > R MS- no significant deformity or atrophy Skin- no rash or lesion Psych- euthymic mood, full affect Neuro- strength and sensation are intact  Wt Readings from Last 3 Encounters:   03/08/20 69.5 kg  03/06/20 68.9 kg  02/01/20 65.8 kg    EKG today demonstrates afib HR 65, LAFB, QRS 76, QTc 436  Echo 12/30/19 demonstrated  1. Left ventricular ejection fraction, by estimation, is 50%. The left  ventricle has mildly decreased function. The left ventricle demonstrates  regional wall motion abnormalities. Apical septal and apical anterior  hypokinesis noted. Left ventricular  diastolic parameters are consistent with Grade I diastolic dysfunction  (impaired relaxation).  2. Right ventricular systolic function is normal. The right ventricular  size is normal. Tricuspid regurgitation signal is inadequate for assessing  PA pressure.  3. The mitral valve is normal in structure and function. Trivial mitral  valve regurgitation. No evidence of mitral stenosis.  4. The aortic valve is tricuspid. Aortic valve regurgitation is not  visualized. Mild aortic valve sclerosis is present, with no evidence of  aortic valve stenosis.  5. The inferior vena cava is normal in size with greater than 50%  respiratory variability, suggesting right atrial pressure of 3 mmHg.   Epic records are reviewed at length today  CHA2DS2-VASc Score = 4 The patient's score is based upon: CHF History: No HTN History: Yes Age : 81 + Diabetes History: No Stroke History: No Vascular Disease History: No Gender: Female      ASSESSMENT AND PLAN: 1. Persistent Atrial Fibrillation (ICD10:  I48.19) The patient's CHA2DS2-VASc score is 4, indicating a 4.8% annual risk of stroke.   General education about afib discussed and questions answered. We also discussed her stroke risk and the risks and benefits of anticoagulation.  We had a long discussion about therapeutic options with rhythm vs rate control. Patient and family would like to pursue rate control for now given her paucity of symptoms and age.  Will decrease amiodarone to 100 mg daily today with plan to discontinue. May titrate CCB or BB if  more rate control is needed.  Continue Eliquis 5 mg BID (Cr <1.5 and weight >60kg) Continue diltiazem 180 mg daily Continue metoprolol 25 mg BID  2. Secondary Hypercoagulable State (ICD10:  D68.69) The patient is at significant risk for stroke/thromboembolism based upon her CHA2DS2-VASc Score of 4.  Continue Apixaban (Eliquis).   3. HTN Stable, no changes today.   Patient and  family would like a referral to establish care with Dr Caryl Comes, will place order.    [facility] [address] [place], [place] [address] [phone] 03/08/2020 4:05 PM

## 2020-03-08 NOTE — Patient Instructions (Addendum)
Decrease amiodarone to 100mg  a day (1/2 of the 200mg  tablet)  Scheduling will call you to set up appointment with Dr. Caryl Comes

## 2020-03-09 DIAGNOSIS — Z48815 Encounter for surgical aftercare following surgery on the digestive system: Secondary | ICD-10-CM | POA: Diagnosis not present

## 2020-03-09 DIAGNOSIS — D631 Anemia in chronic kidney disease: Secondary | ICD-10-CM | POA: Diagnosis not present

## 2020-03-09 DIAGNOSIS — I129 Hypertensive chronic kidney disease with stage 1 through stage 4 chronic kidney disease, or unspecified chronic kidney disease: Secondary | ICD-10-CM | POA: Diagnosis not present

## 2020-03-09 DIAGNOSIS — Z4801 Encounter for change or removal of surgical wound dressing: Secondary | ICD-10-CM | POA: Diagnosis not present

## 2020-03-09 DIAGNOSIS — S2232XD Fracture of one rib, left side, subsequent encounter for fracture with routine healing: Secondary | ICD-10-CM | POA: Diagnosis not present

## 2020-03-09 DIAGNOSIS — K5730 Diverticulosis of large intestine without perforation or abscess without bleeding: Secondary | ICD-10-CM | POA: Diagnosis not present

## 2020-03-09 DIAGNOSIS — N183 Chronic kidney disease, stage 3 unspecified: Secondary | ICD-10-CM | POA: Diagnosis not present

## 2020-03-09 DIAGNOSIS — I739 Peripheral vascular disease, unspecified: Secondary | ICD-10-CM | POA: Diagnosis not present

## 2020-03-09 DIAGNOSIS — S52502D Unspecified fracture of the lower end of left radius, subsequent encounter for closed fracture with routine healing: Secondary | ICD-10-CM | POA: Diagnosis not present

## 2020-03-16 DIAGNOSIS — N183 Chronic kidney disease, stage 3 unspecified: Secondary | ICD-10-CM | POA: Diagnosis not present

## 2020-03-16 DIAGNOSIS — I739 Peripheral vascular disease, unspecified: Secondary | ICD-10-CM | POA: Diagnosis not present

## 2020-03-16 DIAGNOSIS — S52502D Unspecified fracture of the lower end of left radius, subsequent encounter for closed fracture with routine healing: Secondary | ICD-10-CM | POA: Diagnosis not present

## 2020-03-16 DIAGNOSIS — Z4801 Encounter for change or removal of surgical wound dressing: Secondary | ICD-10-CM | POA: Diagnosis not present

## 2020-03-16 DIAGNOSIS — I129 Hypertensive chronic kidney disease with stage 1 through stage 4 chronic kidney disease, or unspecified chronic kidney disease: Secondary | ICD-10-CM | POA: Diagnosis not present

## 2020-03-16 DIAGNOSIS — Z48815 Encounter for surgical aftercare following surgery on the digestive system: Secondary | ICD-10-CM | POA: Diagnosis not present

## 2020-03-16 DIAGNOSIS — S2232XD Fracture of one rib, left side, subsequent encounter for fracture with routine healing: Secondary | ICD-10-CM | POA: Diagnosis not present

## 2020-03-16 DIAGNOSIS — K5730 Diverticulosis of large intestine without perforation or abscess without bleeding: Secondary | ICD-10-CM | POA: Diagnosis not present

## 2020-03-16 DIAGNOSIS — D631 Anemia in chronic kidney disease: Secondary | ICD-10-CM | POA: Diagnosis not present

## 2020-03-17 ENCOUNTER — Telehealth: Payer: Self-pay

## 2020-03-17 NOTE — Telephone Encounter (Signed)
Ok for orders? 

## 2020-03-17 NOTE — Telephone Encounter (Signed)
Sharyn Lull calling to get verbal orders. Please follow up with the rep at the phone number provided   Orders: Twice a week for 4 more weeks  Once a week for 3 weeks

## 2020-03-17 NOTE — Telephone Encounter (Signed)
Verita Lamb PT at [facility] with verbal orders for PT listed below

## 2020-03-20 ENCOUNTER — Encounter: Payer: Self-pay | Admitting: Family Medicine

## 2020-03-21 DIAGNOSIS — M47816 Spondylosis without myelopathy or radiculopathy, lumbar region: Secondary | ICD-10-CM | POA: Diagnosis not present

## 2020-03-21 DIAGNOSIS — S2232XD Fracture of one rib, left side, subsequent encounter for fracture with routine healing: Secondary | ICD-10-CM | POA: Diagnosis not present

## 2020-03-21 DIAGNOSIS — S52502D Unspecified fracture of the lower end of left radius, subsequent encounter for closed fracture with routine healing: Secondary | ICD-10-CM | POA: Diagnosis not present

## 2020-03-21 DIAGNOSIS — M353 Polymyalgia rheumatica: Secondary | ICD-10-CM | POA: Diagnosis not present

## 2020-03-21 DIAGNOSIS — M25552 Pain in left hip: Secondary | ICD-10-CM | POA: Diagnosis not present

## 2020-03-21 DIAGNOSIS — M8580 Other specified disorders of bone density and structure, unspecified site: Secondary | ICD-10-CM | POA: Diagnosis not present

## 2020-03-21 DIAGNOSIS — D631 Anemia in chronic kidney disease: Secondary | ICD-10-CM | POA: Diagnosis not present

## 2020-03-21 DIAGNOSIS — N1831 Chronic kidney disease, stage 3a: Secondary | ICD-10-CM | POA: Diagnosis not present

## 2020-03-21 DIAGNOSIS — I129 Hypertensive chronic kidney disease with stage 1 through stage 4 chronic kidney disease, or unspecified chronic kidney disease: Secondary | ICD-10-CM | POA: Diagnosis not present

## 2020-03-24 DIAGNOSIS — M47816 Spondylosis without myelopathy or radiculopathy, lumbar region: Secondary | ICD-10-CM | POA: Diagnosis not present

## 2020-03-24 DIAGNOSIS — M8580 Other specified disorders of bone density and structure, unspecified site: Secondary | ICD-10-CM | POA: Diagnosis not present

## 2020-03-24 DIAGNOSIS — S2232XD Fracture of one rib, left side, subsequent encounter for fracture with routine healing: Secondary | ICD-10-CM | POA: Diagnosis not present

## 2020-03-24 DIAGNOSIS — S52502D Unspecified fracture of the lower end of left radius, subsequent encounter for closed fracture with routine healing: Secondary | ICD-10-CM | POA: Diagnosis not present

## 2020-03-24 DIAGNOSIS — I129 Hypertensive chronic kidney disease with stage 1 through stage 4 chronic kidney disease, or unspecified chronic kidney disease: Secondary | ICD-10-CM | POA: Diagnosis not present

## 2020-03-24 DIAGNOSIS — D631 Anemia in chronic kidney disease: Secondary | ICD-10-CM | POA: Diagnosis not present

## 2020-03-24 DIAGNOSIS — M353 Polymyalgia rheumatica: Secondary | ICD-10-CM | POA: Diagnosis not present

## 2020-03-24 DIAGNOSIS — N1831 Chronic kidney disease, stage 3a: Secondary | ICD-10-CM | POA: Diagnosis not present

## 2020-03-24 DIAGNOSIS — M25552 Pain in left hip: Secondary | ICD-10-CM | POA: Diagnosis not present

## 2020-03-27 DIAGNOSIS — I129 Hypertensive chronic kidney disease with stage 1 through stage 4 chronic kidney disease, or unspecified chronic kidney disease: Secondary | ICD-10-CM | POA: Diagnosis not present

## 2020-03-27 DIAGNOSIS — S2232XD Fracture of one rib, left side, subsequent encounter for fracture with routine healing: Secondary | ICD-10-CM | POA: Diagnosis not present

## 2020-03-27 DIAGNOSIS — N1831 Chronic kidney disease, stage 3a: Secondary | ICD-10-CM | POA: Diagnosis not present

## 2020-03-27 DIAGNOSIS — S52502D Unspecified fracture of the lower end of left radius, subsequent encounter for closed fracture with routine healing: Secondary | ICD-10-CM | POA: Diagnosis not present

## 2020-03-27 DIAGNOSIS — D631 Anemia in chronic kidney disease: Secondary | ICD-10-CM | POA: Diagnosis not present

## 2020-03-27 DIAGNOSIS — M47816 Spondylosis without myelopathy or radiculopathy, lumbar region: Secondary | ICD-10-CM | POA: Diagnosis not present

## 2020-03-27 DIAGNOSIS — M25552 Pain in left hip: Secondary | ICD-10-CM | POA: Diagnosis not present

## 2020-03-27 DIAGNOSIS — M353 Polymyalgia rheumatica: Secondary | ICD-10-CM | POA: Diagnosis not present

## 2020-03-27 DIAGNOSIS — M8580 Other specified disorders of bone density and structure, unspecified site: Secondary | ICD-10-CM | POA: Diagnosis not present

## 2020-03-31 DIAGNOSIS — M353 Polymyalgia rheumatica: Secondary | ICD-10-CM | POA: Diagnosis not present

## 2020-03-31 DIAGNOSIS — M25552 Pain in left hip: Secondary | ICD-10-CM | POA: Diagnosis not present

## 2020-03-31 DIAGNOSIS — S2232XD Fracture of one rib, left side, subsequent encounter for fracture with routine healing: Secondary | ICD-10-CM | POA: Diagnosis not present

## 2020-03-31 DIAGNOSIS — D631 Anemia in chronic kidney disease: Secondary | ICD-10-CM | POA: Diagnosis not present

## 2020-03-31 DIAGNOSIS — S52502D Unspecified fracture of the lower end of left radius, subsequent encounter for closed fracture with routine healing: Secondary | ICD-10-CM | POA: Diagnosis not present

## 2020-03-31 DIAGNOSIS — N1831 Chronic kidney disease, stage 3a: Secondary | ICD-10-CM | POA: Diagnosis not present

## 2020-03-31 DIAGNOSIS — M8580 Other specified disorders of bone density and structure, unspecified site: Secondary | ICD-10-CM | POA: Diagnosis not present

## 2020-03-31 DIAGNOSIS — I129 Hypertensive chronic kidney disease with stage 1 through stage 4 chronic kidney disease, or unspecified chronic kidney disease: Secondary | ICD-10-CM | POA: Diagnosis not present

## 2020-03-31 DIAGNOSIS — M47816 Spondylosis without myelopathy or radiculopathy, lumbar region: Secondary | ICD-10-CM | POA: Diagnosis not present

## 2020-04-04 DIAGNOSIS — N1831 Chronic kidney disease, stage 3a: Secondary | ICD-10-CM | POA: Diagnosis not present

## 2020-04-04 DIAGNOSIS — M47816 Spondylosis without myelopathy or radiculopathy, lumbar region: Secondary | ICD-10-CM | POA: Diagnosis not present

## 2020-04-04 DIAGNOSIS — S52502D Unspecified fracture of the lower end of left radius, subsequent encounter for closed fracture with routine healing: Secondary | ICD-10-CM | POA: Diagnosis not present

## 2020-04-04 DIAGNOSIS — S2232XD Fracture of one rib, left side, subsequent encounter for fracture with routine healing: Secondary | ICD-10-CM | POA: Diagnosis not present

## 2020-04-04 DIAGNOSIS — I129 Hypertensive chronic kidney disease with stage 1 through stage 4 chronic kidney disease, or unspecified chronic kidney disease: Secondary | ICD-10-CM | POA: Diagnosis not present

## 2020-04-04 DIAGNOSIS — M353 Polymyalgia rheumatica: Secondary | ICD-10-CM | POA: Diagnosis not present

## 2020-04-04 DIAGNOSIS — D631 Anemia in chronic kidney disease: Secondary | ICD-10-CM | POA: Diagnosis not present

## 2020-04-04 DIAGNOSIS — M25552 Pain in left hip: Secondary | ICD-10-CM | POA: Diagnosis not present

## 2020-04-04 DIAGNOSIS — M8580 Other specified disorders of bone density and structure, unspecified site: Secondary | ICD-10-CM | POA: Diagnosis not present

## 2020-04-06 DIAGNOSIS — I129 Hypertensive chronic kidney disease with stage 1 through stage 4 chronic kidney disease, or unspecified chronic kidney disease: Secondary | ICD-10-CM | POA: Diagnosis not present

## 2020-04-06 DIAGNOSIS — S2232XD Fracture of one rib, left side, subsequent encounter for fracture with routine healing: Secondary | ICD-10-CM | POA: Diagnosis not present

## 2020-04-06 DIAGNOSIS — D631 Anemia in chronic kidney disease: Secondary | ICD-10-CM | POA: Diagnosis not present

## 2020-04-06 DIAGNOSIS — M8580 Other specified disorders of bone density and structure, unspecified site: Secondary | ICD-10-CM | POA: Diagnosis not present

## 2020-04-06 DIAGNOSIS — M25552 Pain in left hip: Secondary | ICD-10-CM | POA: Diagnosis not present

## 2020-04-06 DIAGNOSIS — M47816 Spondylosis without myelopathy or radiculopathy, lumbar region: Secondary | ICD-10-CM | POA: Diagnosis not present

## 2020-04-06 DIAGNOSIS — S52502D Unspecified fracture of the lower end of left radius, subsequent encounter for closed fracture with routine healing: Secondary | ICD-10-CM | POA: Diagnosis not present

## 2020-04-06 DIAGNOSIS — N1831 Chronic kidney disease, stage 3a: Secondary | ICD-10-CM | POA: Diagnosis not present

## 2020-04-06 DIAGNOSIS — M353 Polymyalgia rheumatica: Secondary | ICD-10-CM | POA: Diagnosis not present

## 2020-04-11 ENCOUNTER — Encounter: Payer: Self-pay | Admitting: Internal Medicine

## 2020-04-11 ENCOUNTER — Telehealth: Payer: Self-pay | Admitting: Internal Medicine

## 2020-04-11 ENCOUNTER — Ambulatory Visit: Payer: Medicare PPO | Admitting: Internal Medicine

## 2020-04-11 ENCOUNTER — Other Ambulatory Visit: Payer: Self-pay

## 2020-04-11 VITALS — BP 174/93 | HR 74 | Ht 67.0 in | Wt 157.0 lb

## 2020-04-11 DIAGNOSIS — I4819 Other persistent atrial fibrillation: Secondary | ICD-10-CM | POA: Diagnosis not present

## 2020-04-11 MED ORDER — FUROSEMIDE 20 MG PO TABS
ORAL_TABLET | ORAL | 0 refills | Status: DC
Start: 2020-04-11 — End: 2020-05-05

## 2020-04-11 NOTE — Patient Instructions (Addendum)
Medication Instructions: Your physician has recommended you make the following change in your medication:   ** Stop taking your Amiodarone ** Begin taking Furosemide 20mg  - 1 tablet by mouth every other day x 4 doses.  *If you need a refill on your cardiac medications before your next appointment, please call your pharmacy*   Lab Work: None ordered.  If you have labs (blood work) drawn today and your tests are completely normal, you will receive your results only by: Marland Kitchen MyChart Message (if you have MyChart) OR . A paper copy in the mail If you have any lab test that is abnormal or we need to change your treatment, we will call you to review the results.   Testing/Procedures: Call us next week if you decide to move forward with [facility]   Follow-Up: At [facility], you and your health needs are our priority.  As part of our continuing mission to provide you with exceptional heart care, we have created designated Provider Care Teams.  These Care Teams include your primary Cardiologist (physician) and Advanced Practice Providers (APPs -  Physician Assistants and Nurse Practitioners) who all work together to provide you with the care you need, when you need it.  We recommend signing up for the patient portal called "MyChart".  Sign up information is provided on this After Visit Summary.  MyChart is used to connect with patients for Virtual Visits (Telemedicine).  Patients are able to view lab/test results, encounter notes, upcoming appointments, etc.  Non-urgent messages can be sent to your provider as well.   To learn more about what you can do with MyChart, go to [email or web address].    Your next appointment:   3 months with Dr Caryl Comes.  You will receive a letter to remind you to schedule

## 2020-04-11 NOTE — Telephone Encounter (Signed)
Patient requesting her son, Tahli Mathson, come with her to her appointment today at 2:30pm, because she states she uses a walker.

## 2020-04-11 NOTE — Progress Notes (Signed)
ELECTROPHYSIOLOGY CONSULT NOTE  Patient ID: [name], MRN: [id number], DOB/AGE: [birth year] 86 y.o. Admit date: (Not on file) Date of Consult: 04/11/2020  Primary Physician: [name], MD Primary Cardiologist: MH/AK>>SK      [name] is a 86 y.o. female who is being seen today for the evaluation of AFib at the request of JA/AFib clinci.    HPI [name] is a 86 y.o. female referred from the A. fib clinic.  Discovered to have atrial fibrillation 2/21 after a fall and wrist fracture.  This occurred in the context of a history of antecedent orthostasis.  Notably, she was also having abdominal pain and her Hospital course was also notable for perforated diverticulitis with peritonitis requiring a Hartman's pouch.  Started on amiodarone diltiazem metoprolol and Eliquis.  Bradycardia prompted decrease of the amiodarone  She has had some edema.  This is been ascribed variably to gabapentin and diltiazem.  Her strength is coming back gradually.  She is now walking with a walker.  No nocturnal dyspnea.  No orthopnea.  No chest pain.  She has been unaware of her atrial fibrillation.  The abrupt loss of her health in the last 6 months has been mentally very disruptive.  She is struggled to remain positive.  She has been doing much better of late.       DATE TEST EF   2/21 Echo   50 %         Date Cr K Hgb  3/21 1.02 4.5 [id number]    Thromboembolic risk factors ( age  -2, HTN-1, Gender-1) for a CHADSVASc Score of >=4   Past Medical History:  Diagnosis Date  . DVT (deep venous thrombosis) ([place]) 09/2015   RLE  . GERD (gastroesophageal reflux disease)   . Hypertension   . Hypothyroidism   . Melanoma ([place]) 06/16/2017   Facial melanoma, removed by Dr. Harvel Quale  . Peripheral vascular disease ([place])    peripheral neuropathy      Surgical History:  Past Surgical History:  Procedure Laterality Date  . ABDOMINAL HYSTERECTOMY    . APPENDECTOMY    .  BACK SURGERY    . BREAST SURGERY     left breast biopsy  . COLOSTOMY N/A 01/06/2020   Procedure: Colostomy;  Surgeon: Greer Pickerel, MD;  Location: [place];  Service: General;  Laterality: N/A;  . EYE SURGERY     cataract  . HARDWARE REMOVAL Left 01/01/2020   Procedure: HARDWARE REMOVAL;  Surgeon: Iran Planas, MD;  Location: [place];  Service: Orthopedics;  Laterality: Left;  . LAPAROTOMY N/A 01/06/2020   Procedure: Exploratory Laparotomy, Open Sigmoid colectomy;  Surgeon: Greer Pickerel, MD;  Location: [place];  Service: General;  Laterality: N/A;  . LIPOMA EXCISION Left 04/25/2015   Procedure: EXCISION OF LIPOMA LEFT ARM;  Surgeon: Donnie Mesa, MD;  Location: [place];  Service: General;  Laterality: Left;  . LUMBAR LAMINECTOMY/DECOMPRESSION MICRODISCECTOMY  11/20/2011   Procedure: LUMBAR LAMINECTOMY/DECOMPRESSION MICRODISCECTOMY;  Surgeon: Jeneen Rinks P [place];  [facility];  Service: Orthopedics;  Laterality: N/A;  Decompressive Laminectomy L2 to the Sacrum (X-Ray)  . OPEN REDUCTION INTERNAL FIXATION (ORIF) DISTAL RADIAL FRACTURE Left 01/01/2020   Procedure: OPEN REDUCTION INTERNAL FIXATION (ORIF) DISTAL RADIAL FRACTURE;  Surgeon: Iran Planas, MD;  Location: [place];  Service: Orthopedics;  Laterality: Left;  . OTHER SURGICAL HISTORY     left wrist surgery - has plate in left wrist   .  OTHER SURGICAL HISTORY     right knee surgery due to torn cartilage   . TONSILLECTOMY    . WRIST FRACTURE SURGERY Left      Home Meds: Current Meds  Medication Sig  . acetaminophen (TYLENOL) 325 MG tablet Take 1-2 tablets (325-650 mg total) by mouth every 4 (four) hours as needed for mild pain.  Marland Kitchen diltiazem (DILACOR XR) 180 MG 24 hr capsule Take 1 capsule (180 mg total) by mouth daily.  Marland Kitchen docusate sodium (COLACE) 100 MG capsule Take 1 capsule (100 mg total) by mouth 2 (two) times daily.  Marland Kitchen ELIQUIS 5 MG TABS tablet TAKE 1 TABLET BY MOUTH TWICE A DAY  . gabapentin (NEURONTIN) 300 MG capsule  Take 300 mg by mouth at bedtime.  Marland Kitchen levothyroxine (SYNTHROID) 50 MCG tablet TAKE 1 TABLET BY MOUTH EVERY DAY  . metoprolol tartrate (LOPRESSOR) 25 MG tablet Take 1 tablet (25 mg total) by mouth 2 (two) times daily.  . Multiple Vitamins-Minerals (PRESERVISION AREDS 2) CAPS Take 1 capsule by mouth daily.   . ondansetron (ZOFRAN) 4 MG tablet Take 1 tablet (4 mg total) by mouth every 8 (eight) hours as needed for nausea or vomiting.  . pantoprazole (PROTONIX) 40 MG tablet Take 1 tablet (40 mg total) by mouth daily.  . predniSONE (DELTASONE) 5 MG tablet Take 10 mg by mouth daily with breakfast.   . senna-docusate (SENOKOT-S) 8.6-50 MG tablet Take 1 tablet by mouth at bedtime.  . [DISCONTINUED] amiodarone (PACERONE) 200 MG tablet Take 0.5 tablets (100 mg total) by mouth daily.    Allergies:  Allergies  Allergen Reactions  . Codeine Other (See Comments)    Nightmares, imagining things  . Keflex [Cephalexin] Nausea Only    Pt ended up in ER w/ CP    Social History   Socioeconomic History  . Marital status: Widowed    Spouse name: Not on file  . Number of children: Not on file  . Years of education: Not on file  . Highest education level: Not on file  Occupational History  . Not on file  Tobacco Use  . Smoking status: Never Smoker  . Smokeless tobacco: Never Used  Substance and Sexual Activity  . Alcohol use: No  . Drug use: No  . Sexual activity: Never  Other Topics Concern  . Not on file  Social History Narrative  . Not on file   Social Determinants of Health   Financial Resource Strain:   . Difficulty of Paying Living Expenses:   Food Insecurity:   . Worried About Charity fundraiser in the Last Year:   . Arboriculturist in the Last Year:   Transportation Needs:   . Film/video editor (Medical):   Marland Kitchen Lack of Transportation (Non-Medical):   Physical Activity:   . Days of Exercise per Week:   . Minutes of Exercise per Session:   Stress:   . Feeling of Stress :    Social Connections:   . Frequency of Communication with Friends and Family:   . Frequency of Social Gatherings with Friends and Family:   . Attends Religious Services:   . Active Member of Clubs or Organizations:   . Attends Archivist Meetings:   Marland Kitchen Marital Status:   Intimate Partner Violence:   . Fear of Current or Ex-Partner:   . Emotionally Abused:   Marland Kitchen Physically Abused:   . Sexually Abused:      Family History  Problem Relation Age  of Onset  . Cancer Mother        BREAST  . COPD Father   . Emphysema Father   . Cancer Daughter        breast     ROS:  Please see the history of present illness.     All other systems reviewed and negative.    Physical Exam:= Blood pressure (!) 174/93, pulse 74, height 5\' 7"  (1.702 m), weight 157 lb (71.2 kg), SpO2 97 %. General: Well developed, well nourished female in no acute distress. Head: Normocephalic, atraumatic, sclera non-icteric, no xanthomas, nares are without discharge. EENT: normal  Lymph Nodes:  none Neck: Negative for carotid bruits. JVD less than 10 cm (assessed sitting up) Back:without scoliosis kyphosis Lungs: Clear bilaterally to auscultation without wheezes, rales, or rhonchi. Breathing is unlabored. Heart: Irregularly irregular rate and rhythm with a 2 /6 systolic  murmur . No rubs, or gallops appreciated. Abdomen: Soft, non-tender, non-distended with normoactive bowel sounds. No hepatomegaly. No rebound/guarding. No obvious abdominal masses. Msk:  Strength and tone appear normal for age. Extremities: No clubbing or cyanosis.  1+ edema.  Distal pedal pulses are 2+ and equal bilaterally. Skin: Warm and Dry Neuro: Alert and oriented X 3. CN III-XII intact Grossly normal sensory and motor function . Psych:  Responds to questions appropriately with a normal affect.      Labs: Cardiac Enzymes No results for input(s): CKTOTAL, CKMB, TROPONINI in the last 72 hours. CBC Lab Results  Component Value Date    WBC 8.9 01/26/2020   HGB 11.9 (L) 01/26/2020   HCT 36.6 01/26/2020   MCV 93.6 01/26/2020   PLT 448.0 (H) 01/26/2020   PROTIME: No results for input(s): LABPROT, INR in the last 72 hours. Chemistry No results for input(s): NA, K, CL, CO2, BUN, CREATININE, CALCIUM, PROT, BILITOT, ALKPHOS, ALT, AST, GLUCOSE in the last 168 hours.  Invalid input(s): LABALBU Lipids Lab Results  Component Value Date   CHOL 190 06/22/2019   HDL 42.60 06/22/2019   LDLCALC 114 (H) 06/22/2019   TRIG 169.0 (H) 06/22/2019   BNP No results found for: PROBNP Thyroid Function Tests: No results for input(s): TSH, T4TOTAL, T3FREE, THYROIDAB in the last 72 hours.  Invalid input(s): FREET3 Miscellaneous No results found for: DDIMER  Radiology/Studies:  No results found.  EKG: Atrial fibrillation at 71 Intervals-/08/40 Axis left -65  Last ECG prior to 2/21 was 4/17 sinus rhythm Assessment and Plan:   Atrial fibrillation persistent  Hypertension  HFpEF   The patient has persistent atrial fibrillation.  She was started on amiodarone and has been left on amiodarone.  At this juncture I see no indication for continuing it.  As she has been left in A. fib, it is role for sustaining of sinus rhythm is absent.  Initial effort at cardioversion in my mind is appropriately undertaken without antiarrhythmic support.  Hence we will stop it.  We then discussed the role of cardioversion.  She feels as if she is getting better and better as she moves out to weeks in the months from the calamity's of February.   .  We reviewed the physiology of atrial fibrillation and discussed the therapeutic strategies of rate control versus rhythm control.  In general, quality of life measures and mortality measures are similar in the group of patients in whom ablation is not an option.  We discussed then, the specific role of rhythm control, including the issues of pro arrhythmia, and its relationship to ongoing symptoms despite  efforts at rate control.    For now, she would like to ponder the role of cardioversion.  We will call her next week.  My recommendation is that one effort at antiarrhythmic [place] supported cardioversion is reasonable and almost everybody to answer the question whether she is better in sinus or not.  In the event that she can tell no difference, then a strategy of resignation to rate control in the event that she reverted to atrial fibrillation would be appropriate.    If she does feel better in sinus rhythm then discussions of antiarrhythmic drug therapies would be appropriate.  There are others but I would try prior to amiodarone.  She is volume overloaded.  It has been ascribed to gabapentin as well as diltiazem.  However, in the setting of her atrial fibrillation I suspect it is HFpEF.  We will give her a short course of diuretics furosemide 20 mg daily x3 days.   Virl Axe

## 2020-04-11 NOTE — H&P (View-Only) (Signed)
ELECTROPHYSIOLOGY CONSULT NOTE  Patient ID: [name], MRN: [id number], DOB/AGE: [birth year] 86 y.o. Admit date: (Not on file) Date of Consult: 04/11/2020  Primary Physician: [name], MD Primary Cardiologist: MH/AK>>SK      [name] is a 86 y.o. female who is being seen today for the evaluation of AFib at the request of JA/AFib clinci.    HPI [name] is a 86 y.o. female referred from the A. fib clinic.  Discovered to have atrial fibrillation 2/21 after a fall and wrist fracture.  This occurred in the context of a history of antecedent orthostasis.  Notably, she was also having abdominal pain and her Hospital course was also notable for perforated diverticulitis with peritonitis requiring a Hartman's pouch.  Started on amiodarone diltiazem metoprolol and Eliquis.  Bradycardia prompted decrease of the amiodarone  She has had some edema.  This is been ascribed variably to gabapentin and diltiazem.  Her strength is coming back gradually.  She is now walking with a walker.  No nocturnal dyspnea.  No orthopnea.  No chest pain.  She has been unaware of her atrial fibrillation.  The abrupt loss of her health in the last 6 months has been mentally very disruptive.  She is struggled to remain positive.  She has been doing much better of late.       DATE TEST EF   2/21 Echo   50 %         Date Cr K Hgb  3/21 1.02 4.5 [id number]    Thromboembolic risk factors ( age  -2, HTN-1, Gender-1) for a CHADSVASc Score of >=4   Past Medical History:  Diagnosis Date  . DVT (deep venous thrombosis) ([place]) 09/2015   RLE  . GERD (gastroesophageal reflux disease)   . Hypertension   . Hypothyroidism   . Melanoma ([place]) 06/16/2017   Facial melanoma, removed by Dr. Harvel Quale  . Peripheral vascular disease ([place])    peripheral neuropathy      Surgical History:  Past Surgical History:  Procedure Laterality Date  . ABDOMINAL HYSTERECTOMY    . APPENDECTOMY    .  BACK SURGERY    . BREAST SURGERY     left breast biopsy  . COLOSTOMY N/A 01/06/2020   Procedure: Colostomy;  Surgeon: Greer Pickerel, MD;  Location: [facility];  Service: General;  Laterality: N/A;  . EYE SURGERY     cataract  . HARDWARE REMOVAL Left 01/01/2020   Procedure: HARDWARE REMOVAL;  Surgeon: Iran Planas, MD;  Location: [facility];  Service: Orthopedics;  Laterality: Left;  . LAPAROTOMY N/A 01/06/2020   Procedure: Exploratory Laparotomy, Open Sigmoid colectomy;  Surgeon: Greer Pickerel, MD;  Location: [facility];  Service: General;  Laterality: N/A;  . LIPOMA EXCISION Left 04/25/2015   Procedure: EXCISION OF LIPOMA LEFT ARM;  Surgeon: Donnie Mesa, MD;  Location: [facility];  Service: General;  Laterality: Left;  . LUMBAR LAMINECTOMY/DECOMPRESSION MICRODISCECTOMY  11/20/2011   Procedure: LUMBAR LAMINECTOMY/DECOMPRESSION MICRODISCECTOMY;  Surgeon: Jeneen Rinks P [place];  Location: [facility];  Service: Orthopedics;  Laterality: N/A;  Decompressive Laminectomy L2 to the Sacrum (X-Ray)  . OPEN REDUCTION INTERNAL FIXATION (ORIF) DISTAL RADIAL FRACTURE Left 01/01/2020   Procedure: OPEN REDUCTION INTERNAL FIXATION (ORIF) DISTAL RADIAL FRACTURE;  Surgeon: Iran Planas, MD;  Location: [facility];  Service: Orthopedics;  Laterality: Left;  . OTHER SURGICAL HISTORY     left wrist surgery - has plate in left wrist   .  OTHER SURGICAL HISTORY     right knee surgery due to torn cartilage   . TONSILLECTOMY    . WRIST FRACTURE SURGERY Left      Home Meds: Current Meds  Medication Sig  . acetaminophen (TYLENOL) 325 MG tablet Take 1-2 tablets (325-650 mg total) by mouth every 4 (four) hours as needed for mild pain.  Marland Kitchen diltiazem (DILACOR XR) 180 MG 24 hr capsule Take 1 capsule (180 mg total) by mouth daily.  Marland Kitchen docusate sodium (COLACE) 100 MG capsule Take 1 capsule (100 mg total) by mouth 2 (two) times daily.  Marland Kitchen ELIQUIS 5 MG TABS tablet TAKE 1 TABLET BY MOUTH TWICE A DAY  . gabapentin (NEURONTIN) 300 MG capsule  Take 300 mg by mouth at bedtime.  Marland Kitchen levothyroxine (SYNTHROID) 50 MCG tablet TAKE 1 TABLET BY MOUTH EVERY DAY  . metoprolol tartrate (LOPRESSOR) 25 MG tablet Take 1 tablet (25 mg total) by mouth 2 (two) times daily.  . Multiple Vitamins-Minerals (PRESERVISION AREDS 2) CAPS Take 1 capsule by mouth daily.   . ondansetron (ZOFRAN) 4 MG tablet Take 1 tablet (4 mg total) by mouth every 8 (eight) hours as needed for nausea or vomiting.  . pantoprazole (PROTONIX) 40 MG tablet Take 1 tablet (40 mg total) by mouth daily.  . predniSONE (DELTASONE) 5 MG tablet Take 10 mg by mouth daily with breakfast.   . senna-docusate (SENOKOT-S) 8.6-50 MG tablet Take 1 tablet by mouth at bedtime.  . [DISCONTINUED] amiodarone (PACERONE) 200 MG tablet Take 0.5 tablets (100 mg total) by mouth daily.    Allergies:  Allergies  Allergen Reactions  . Codeine Other (See Comments)    Nightmares, imagining things  . Keflex [Cephalexin] Nausea Only    Pt ended up in ER w/ CP    Social History   Socioeconomic History  . Marital status: Widowed    Spouse name: Not on file  . Number of children: Not on file  . Years of education: Not on file  . Highest education level: Not on file  Occupational History  . Not on file  Tobacco Use  . Smoking status: Never Smoker  . Smokeless tobacco: Never Used  Substance and Sexual Activity  . Alcohol use: No  . Drug use: No  . Sexual activity: Never  Other Topics Concern  . Not on file  Social History Narrative  . Not on file   Social Determinants of Health   Financial Resource Strain:   . Difficulty of Paying Living Expenses:   Food Insecurity:   . Worried About Charity fundraiser in the Last Year:   . Arboriculturist in the Last Year:   Transportation Needs:   . Film/video editor (Medical):   Marland Kitchen Lack of Transportation (Non-Medical):   Physical Activity:   . Days of Exercise per Week:   . Minutes of Exercise per Session:   Stress:   . Feeling of Stress :     Social Connections:   . Frequency of Communication with Friends and Family:   . Frequency of Social Gatherings with Friends and Family:   . Attends Religious Services:   . Active Member of Clubs or Organizations:   . Attends Archivist Meetings:   Marland Kitchen Marital Status:   Intimate Partner Violence:   . Fear of Current or Ex-Partner:   . Emotionally Abused:   Marland Kitchen Physically Abused:   . Sexually Abused:      Family History  Problem Relation  Age of Onset  . Cancer Mother        BREAST  . COPD Father   . Emphysema Father   . Cancer Daughter        breast     ROS:  Please see the history of present illness.     All other systems reviewed and negative.    Physical Exam:= Blood pressure (!) 174/93, pulse 74, height 5\' 7"  (1.702 m), weight 157 lb (71.2 kg), SpO2 97 %. General: Well developed, well nourished female in no acute distress. Head: Normocephalic, atraumatic, sclera non-icteric, no xanthomas, nares are without discharge. EENT: normal  Lymph Nodes:  none Neck: Negative for carotid bruits. JVD less than 10 cm (assessed sitting up) Back:without scoliosis kyphosis Lungs: Clear bilaterally to auscultation without wheezes, rales, or rhonchi. Breathing is unlabored. Heart: Irregularly irregular rate and rhythm with a 2 /6 systolic  murmur . No rubs, or gallops appreciated. Abdomen: Soft, non-tender, non-distended with normoactive bowel sounds. No hepatomegaly. No rebound/guarding. No obvious abdominal masses. Msk:  Strength and tone appear normal for age. Extremities: No clubbing or cyanosis.  1+ edema.  Distal pedal pulses are 2+ and equal bilaterally. Skin: Warm and Dry Neuro: Alert and oriented X 3. CN III-XII intact Grossly normal sensory and motor function . Psych:  Responds to questions appropriately with a normal affect.      Labs: Cardiac Enzymes No results for input(s): CKTOTAL, CKMB, TROPONINI in the last 72 hours. CBC Lab Results  Component Value Date    WBC 8.9 01/26/2020   HGB 11.9 (L) 01/26/2020   HCT 36.6 01/26/2020   MCV 93.6 01/26/2020   PLT 448.0 (H) 01/26/2020   PROTIME: No results for input(s): LABPROT, INR in the last 72 hours. Chemistry No results for input(s): NA, K, CL, CO2, BUN, CREATININE, CALCIUM, PROT, BILITOT, ALKPHOS, ALT, AST, GLUCOSE in the last 168 hours.  Invalid input(s): LABALBU Lipids Lab Results  Component Value Date   CHOL 190 06/22/2019   HDL 42.60 06/22/2019   LDLCALC 114 (H) 06/22/2019   TRIG 169.0 (H) 06/22/2019   BNP No results found for: PROBNP Thyroid Function Tests: No results for input(s): TSH, T4TOTAL, T3FREE, THYROIDAB in the last 72 hours.  Invalid input(s): FREET3 Miscellaneous No results found for: DDIMER  Radiology/Studies:  No results found.  EKG: Atrial fibrillation at 71 Intervals-/08/40 Axis left -65  Last ECG prior to 2/21 was 4/17 sinus rhythm Assessment and Plan:   Atrial fibrillation persistent  Hypertension  HFpEF   The patient has persistent atrial fibrillation.  She was started on amiodarone and has been left on amiodarone.  At this juncture I see no indication for continuing it.  As she has been left in A. fib, it is role for sustaining of sinus rhythm is absent.  Initial effort at cardioversion in my mind is appropriately undertaken without antiarrhythmic support.  Hence we will stop it.  We then discussed the role of cardioversion.  She feels as if she is getting better and better as she moves out to weeks in the months from the calamity's of February.   .  We reviewed the physiology of atrial fibrillation and discussed the therapeutic strategies of rate control versus rhythm control.  In general, quality of life measures and mortality measures are similar in the group of patients in whom ablation is not an option.  We discussed then, the specific role of rhythm control, including the issues of pro arrhythmia, and its relationship to ongoing symptoms  despite  efforts at rate control.    For now, she would like to ponder the role of cardioversion.  We will call her next week.  My recommendation is that one effort at antiarrhythmic [place] supported cardioversion is reasonable and almost everybody to answer the question whether she is better in sinus or not.  In the event that she can tell no difference, then a strategy of resignation to rate control in the event that she reverted to atrial fibrillation would be appropriate.    If she does feel better in sinus rhythm then discussions of antiarrhythmic drug therapies would be appropriate.  There are others but I would try prior to amiodarone.  She is volume overloaded.  It has been ascribed to gabapentin as well as diltiazem.  However, in the setting of her atrial fibrillation I suspect it is HFpEF.  We will give her a short course of diuretics furosemide 20 mg daily x3 days.   Virl Axe

## 2020-04-13 DIAGNOSIS — S52502D Unspecified fracture of the lower end of left radius, subsequent encounter for closed fracture with routine healing: Secondary | ICD-10-CM | POA: Diagnosis not present

## 2020-04-14 ENCOUNTER — Encounter: Payer: Self-pay | Admitting: Family Medicine

## 2020-04-14 DIAGNOSIS — M47816 Spondylosis without myelopathy or radiculopathy, lumbar region: Secondary | ICD-10-CM | POA: Diagnosis not present

## 2020-04-14 DIAGNOSIS — S52502D Unspecified fracture of the lower end of left radius, subsequent encounter for closed fracture with routine healing: Secondary | ICD-10-CM | POA: Diagnosis not present

## 2020-04-14 DIAGNOSIS — D631 Anemia in chronic kidney disease: Secondary | ICD-10-CM | POA: Diagnosis not present

## 2020-04-14 DIAGNOSIS — M353 Polymyalgia rheumatica: Secondary | ICD-10-CM | POA: Diagnosis not present

## 2020-04-14 DIAGNOSIS — S2232XD Fracture of one rib, left side, subsequent encounter for fracture with routine healing: Secondary | ICD-10-CM | POA: Diagnosis not present

## 2020-04-14 DIAGNOSIS — I129 Hypertensive chronic kidney disease with stage 1 through stage 4 chronic kidney disease, or unspecified chronic kidney disease: Secondary | ICD-10-CM | POA: Diagnosis not present

## 2020-04-14 DIAGNOSIS — M25552 Pain in left hip: Secondary | ICD-10-CM | POA: Diagnosis not present

## 2020-04-14 DIAGNOSIS — M8580 Other specified disorders of bone density and structure, unspecified site: Secondary | ICD-10-CM | POA: Diagnosis not present

## 2020-04-14 DIAGNOSIS — N1831 Chronic kidney disease, stage 3a: Secondary | ICD-10-CM | POA: Diagnosis not present

## 2020-04-17 ENCOUNTER — Telehealth: Payer: Self-pay | Admitting: Family Medicine

## 2020-04-17 DIAGNOSIS — R339 Retention of urine, unspecified: Secondary | ICD-10-CM

## 2020-04-17 DIAGNOSIS — Z85820 Personal history of malignant melanoma of skin: Secondary | ICD-10-CM

## 2020-04-17 DIAGNOSIS — Z9181 History of falling: Secondary | ICD-10-CM

## 2020-04-17 DIAGNOSIS — Z86718 Personal history of other venous thrombosis and embolism: Secondary | ICD-10-CM

## 2020-04-17 DIAGNOSIS — I480 Paroxysmal atrial fibrillation: Secondary | ICD-10-CM

## 2020-04-17 DIAGNOSIS — S52502D Unspecified fracture of the lower end of left radius, subsequent encounter for closed fracture with routine healing: Secondary | ICD-10-CM | POA: Diagnosis not present

## 2020-04-17 DIAGNOSIS — I129 Hypertensive chronic kidney disease with stage 1 through stage 4 chronic kidney disease, or unspecified chronic kidney disease: Secondary | ICD-10-CM | POA: Diagnosis not present

## 2020-04-17 DIAGNOSIS — G629 Polyneuropathy, unspecified: Secondary | ICD-10-CM

## 2020-04-17 DIAGNOSIS — D631 Anemia in chronic kidney disease: Secondary | ICD-10-CM | POA: Diagnosis not present

## 2020-04-17 DIAGNOSIS — M8580 Other specified disorders of bone density and structure, unspecified site: Secondary | ICD-10-CM | POA: Diagnosis not present

## 2020-04-17 DIAGNOSIS — N1831 Chronic kidney disease, stage 3a: Secondary | ICD-10-CM | POA: Diagnosis not present

## 2020-04-17 DIAGNOSIS — Z7901 Long term (current) use of anticoagulants: Secondary | ICD-10-CM

## 2020-04-17 DIAGNOSIS — S2232XD Fracture of one rib, left side, subsequent encounter for fracture with routine healing: Secondary | ICD-10-CM | POA: Diagnosis not present

## 2020-04-17 DIAGNOSIS — I700 Atherosclerosis of aorta: Secondary | ICD-10-CM

## 2020-04-17 DIAGNOSIS — I739 Peripheral vascular disease, unspecified: Secondary | ICD-10-CM

## 2020-04-17 DIAGNOSIS — M47816 Spondylosis without myelopathy or radiculopathy, lumbar region: Secondary | ICD-10-CM | POA: Diagnosis not present

## 2020-04-17 DIAGNOSIS — N281 Cyst of kidney, acquired: Secondary | ICD-10-CM

## 2020-04-17 DIAGNOSIS — E43 Unspecified severe protein-calorie malnutrition: Secondary | ICD-10-CM

## 2020-04-17 DIAGNOSIS — M353 Polymyalgia rheumatica: Secondary | ICD-10-CM | POA: Diagnosis not present

## 2020-04-17 DIAGNOSIS — M25552 Pain in left hip: Secondary | ICD-10-CM | POA: Diagnosis not present

## 2020-04-17 DIAGNOSIS — E039 Hypothyroidism, unspecified: Secondary | ICD-10-CM

## 2020-04-17 DIAGNOSIS — K219 Gastro-esophageal reflux disease without esophagitis: Secondary | ICD-10-CM

## 2020-04-17 NOTE — Telephone Encounter (Signed)
Picked up form - and faxed to [facility] - made copy for scanning - sa

## 2020-04-20 ENCOUNTER — Telehealth: Payer: Self-pay | Admitting: Internal Medicine

## 2020-04-20 DIAGNOSIS — M47816 Spondylosis without myelopathy or radiculopathy, lumbar region: Secondary | ICD-10-CM | POA: Diagnosis not present

## 2020-04-20 DIAGNOSIS — R5381 Other malaise: Secondary | ICD-10-CM | POA: Diagnosis not present

## 2020-04-20 DIAGNOSIS — I129 Hypertensive chronic kidney disease with stage 1 through stage 4 chronic kidney disease, or unspecified chronic kidney disease: Secondary | ICD-10-CM | POA: Diagnosis not present

## 2020-04-20 DIAGNOSIS — S5292XA Unspecified fracture of left forearm, initial encounter for closed fracture: Secondary | ICD-10-CM | POA: Diagnosis not present

## 2020-04-20 DIAGNOSIS — M25552 Pain in left hip: Secondary | ICD-10-CM | POA: Diagnosis not present

## 2020-04-20 DIAGNOSIS — M8580 Other specified disorders of bone density and structure, unspecified site: Secondary | ICD-10-CM | POA: Diagnosis not present

## 2020-04-20 DIAGNOSIS — N1831 Chronic kidney disease, stage 3a: Secondary | ICD-10-CM | POA: Diagnosis not present

## 2020-04-20 DIAGNOSIS — S2232XD Fracture of one rib, left side, subsequent encounter for fracture with routine healing: Secondary | ICD-10-CM | POA: Diagnosis not present

## 2020-04-20 DIAGNOSIS — M1990 Unspecified osteoarthritis, unspecified site: Secondary | ICD-10-CM | POA: Diagnosis not present

## 2020-04-20 DIAGNOSIS — S52502D Unspecified fracture of the lower end of left radius, subsequent encounter for closed fracture with routine healing: Secondary | ICD-10-CM | POA: Diagnosis not present

## 2020-04-20 DIAGNOSIS — I10 Essential (primary) hypertension: Secondary | ICD-10-CM | POA: Diagnosis not present

## 2020-04-20 DIAGNOSIS — M353 Polymyalgia rheumatica: Secondary | ICD-10-CM | POA: Diagnosis not present

## 2020-04-20 DIAGNOSIS — D631 Anemia in chronic kidney disease: Secondary | ICD-10-CM | POA: Diagnosis not present

## 2020-04-20 NOTE — Telephone Encounter (Signed)
New message   Patient has decided to have procedure that was discussed done. Please call.

## 2020-04-20 NOTE — Telephone Encounter (Signed)
Spoke with pt, she desires to move forward with cardioversion.  Pt advised once procedure scheduled will contact with details.  Pt verbalizes understanding and agrees with current plan.

## 2020-04-21 NOTE — Telephone Encounter (Signed)
Spoke with pt and reviewed cardioversion instruction letter.  Pt advised letter has been released to [place]. Stressed importance of not missing any doses of Eliquis. Pt verbalizes understanding and agrees with current plan.  Dear [name],  You are scheduled for a Cardioversion on Friday 04/28/2020 with Dr. Jenkins Rouge.  Please arrive at the [facility] (Main Entrance A) at [facility]: [place], [place] [address] at 9am.   DIET: Nothing to eat or drink after midnight except a sip of water with medications   Medication Instructions:  **Hold your FUROSEMIDE the morning of your procedure.  **Continue your anticoagulant: ELIQUIS You will need to continue your anticoagulant after your procedure until you are told by  your Provider that it is safe to stop   Labs: You will have your labs completed the morning of your procedure at the hospital.  CBC and BMET.   You must have a responsible person to drive you home and stay in the waiting area during your procedure. Failure to do so could result in cancellation.  Bring your insurance cards.  *Special Note: Every effort is made to have your procedure done on time. Occasionally there are emergencies that occur at the hospital that may cause delays. Please be patient if a delay does occur.

## 2020-04-27 ENCOUNTER — Telehealth: Payer: Self-pay | Admitting: Internal Medicine

## 2020-04-27 DIAGNOSIS — S52502D Unspecified fracture of the lower end of left radius, subsequent encounter for closed fracture with routine healing: Secondary | ICD-10-CM | POA: Diagnosis not present

## 2020-04-27 DIAGNOSIS — S2232XD Fracture of one rib, left side, subsequent encounter for fracture with routine healing: Secondary | ICD-10-CM | POA: Diagnosis not present

## 2020-04-27 DIAGNOSIS — M25552 Pain in left hip: Secondary | ICD-10-CM | POA: Diagnosis not present

## 2020-04-27 DIAGNOSIS — D631 Anemia in chronic kidney disease: Secondary | ICD-10-CM | POA: Diagnosis not present

## 2020-04-27 DIAGNOSIS — M47816 Spondylosis without myelopathy or radiculopathy, lumbar region: Secondary | ICD-10-CM | POA: Diagnosis not present

## 2020-04-27 DIAGNOSIS — M353 Polymyalgia rheumatica: Secondary | ICD-10-CM | POA: Diagnosis not present

## 2020-04-27 DIAGNOSIS — M8580 Other specified disorders of bone density and structure, unspecified site: Secondary | ICD-10-CM | POA: Diagnosis not present

## 2020-04-27 DIAGNOSIS — N1831 Chronic kidney disease, stage 3a: Secondary | ICD-10-CM | POA: Diagnosis not present

## 2020-04-27 DIAGNOSIS — I129 Hypertensive chronic kidney disease with stage 1 through stage 4 chronic kidney disease, or unspecified chronic kidney disease: Secondary | ICD-10-CM | POA: Diagnosis not present

## 2020-04-27 NOTE — Telephone Encounter (Signed)
    Pt would like to speak with Rosann Auerbach, she said [facility] called her about her cardioversion tomorrow. She said they asked her if she got her covid test, she answered no and the [facility] staff said they can't do the procedure without it. She would like to reschedule procedure

## 2020-04-27 NOTE — Telephone Encounter (Signed)
Returned call to Pt.  Pt was not scheduled for a covid test prior to her DCCV scheduled for 04/28/2020.  She was then contacted by endoscopy that encouraged Pt to get her covid test today 04/27/2020 before the testing site closed.  This was too stressful for the Pt.  She wants to cancel her DCCV and reschedule it for another time.  She requests Rosann Auerbach call her on Monday to start the process over again.  Advised would let Rosann Auerbach know to call on Monday.

## 2020-04-27 NOTE — Progress Notes (Signed)
Patient was not scheduled for a Covid test prior to procedure.  Told patient to go to [facility] now to get tested, we will call patient in am if results are not back.  Patient understands and will go get tested.

## 2020-04-28 ENCOUNTER — Ambulatory Visit (HOSPITAL_COMMUNITY): Admission: RE | Admit: 2020-04-28 | Payer: Medicare PPO | Source: Home / Self Care | Admitting: Cardiovascular Disease

## 2020-04-28 ENCOUNTER — Encounter (HOSPITAL_COMMUNITY): Admission: RE | Payer: Self-pay | Source: Home / Self Care

## 2020-04-28 SURGERY — CARDIOVERSION
Anesthesia: General

## 2020-05-01 ENCOUNTER — Telehealth: Payer: Self-pay | Admitting: Internal Medicine

## 2020-05-01 ENCOUNTER — Encounter: Payer: Self-pay | Admitting: Family Medicine

## 2020-05-01 NOTE — Telephone Encounter (Signed)
Spoke with pt and advised DCCV will be rescheduled along with Covid testing. Will contact pt once procedure has been rescheduled. Pt verbalizes understanding and agrees with current plan.

## 2020-05-01 NOTE — Telephone Encounter (Signed)
Pt c/o medication issue:  1. Name of Medication: metoprolol tartrate (LOPRESSOR) 25 MG tablet  2. How are you currently taking this medication (dosage and times per day)?  1 tablet twice daily  3. Are you having a reaction (difficulty breathing--STAT)? no  4. What is your medication issue? Patient states her hair has been falling out like crazy and states Dr. Birdie Riddle told her it may be the metoprolol. She would like to know if there is something else she can take.

## 2020-05-02 ENCOUNTER — Other Ambulatory Visit: Payer: Self-pay | Admitting: Internal Medicine

## 2020-05-02 ENCOUNTER — Telehealth: Payer: Self-pay

## 2020-05-02 DIAGNOSIS — I4819 Other persistent atrial fibrillation: Secondary | ICD-10-CM

## 2020-05-02 NOTE — Telephone Encounter (Signed)
Spoke with pt and advised DCCV scheduled for Tuesday 05/09/2020, report to [facility] at 1030am.  Covid test scheduled for Friday, 05/05/2020 at 130pm.  Pt advised instructions released to MyChart and to call for any additional instructions.  Pt verbalized understanding and agrees with current plan.

## 2020-05-02 NOTE — Telephone Encounter (Signed)
Spoke with [name] who states Metoprolol was prescribed by Hospitalist during her March hospital stay.  [name] reports over the last month she has had an increase in hair loss "by the handfuls."  [name] advised of hair loss being a side effect of beta blockers.  [name] reports she routinely has thyroid labs and takes her levothyroxine.  [name] was taking Nadolol prior to hospitalization without hair loss and would like to know if it would be appropriate for her to restart Nadolol and stop Metoprolol.  [name] advised will route request to Dr Caryl Comes for review and recommendation.  [name] verbalizes understanding and agrees with current plan.

## 2020-05-03 MED ORDER — NADOLOL 80 MG PO TABS
80.0000 mg | ORAL_TABLET | Freq: Every day | ORAL | 3 refills | Status: DC
Start: 2020-05-03 — End: 2020-10-27

## 2020-05-03 NOTE — Telephone Encounter (Signed)
Attempted phone call to pt.  Left voicemail message to contact RN at [phone]. 

## 2020-05-03 NOTE — Telephone Encounter (Signed)
Spoke with pt and advised per Dr Caryl Comes, [place] to stop Metoprolol and restart Nadolol.  Pt reports she has Nadolol 80mg  at home from previous Rx.  Pt verbalizes understanding and agrees with current plan.

## 2020-05-03 NOTE — Telephone Encounter (Signed)
Can certainly try nadolol.  I would be pleasantly surprised if nadolol was not assoc with hair loss because it is reported to be a class effect ( ie all BB)  Thanks SK

## 2020-05-03 NOTE — Telephone Encounter (Signed)
Spoke with pt who is asking if Dr Caryl Comes has addressed her request of changing beta blocker from Metoprolol back to Nadolol d/t hair loss.  Pt advised request has been sent to Dr Caryl Comes and awaiting his response.  Will contact pt as soon as Dr Caryl Comes offers his recommendation.  Pt verbalizes understanding and agrees with current plan.

## 2020-05-04 ENCOUNTER — Other Ambulatory Visit: Payer: Self-pay | Admitting: Internal Medicine

## 2020-05-04 ENCOUNTER — Telehealth: Payer: Self-pay | Admitting: Family Medicine

## 2020-05-04 DIAGNOSIS — M8580 Other specified disorders of bone density and structure, unspecified site: Secondary | ICD-10-CM | POA: Diagnosis not present

## 2020-05-04 DIAGNOSIS — S2232XD Fracture of one rib, left side, subsequent encounter for fracture with routine healing: Secondary | ICD-10-CM | POA: Diagnosis not present

## 2020-05-04 DIAGNOSIS — S52502A Unspecified fracture of the lower end of left radius, initial encounter for closed fracture: Secondary | ICD-10-CM

## 2020-05-04 DIAGNOSIS — I129 Hypertensive chronic kidney disease with stage 1 through stage 4 chronic kidney disease, or unspecified chronic kidney disease: Secondary | ICD-10-CM | POA: Diagnosis not present

## 2020-05-04 DIAGNOSIS — M25552 Pain in left hip: Secondary | ICD-10-CM

## 2020-05-04 DIAGNOSIS — M2550 Pain in unspecified joint: Secondary | ICD-10-CM

## 2020-05-04 DIAGNOSIS — M47816 Spondylosis without myelopathy or radiculopathy, lumbar region: Secondary | ICD-10-CM | POA: Diagnosis not present

## 2020-05-04 DIAGNOSIS — S52502D Unspecified fracture of the lower end of left radius, subsequent encounter for closed fracture with routine healing: Secondary | ICD-10-CM | POA: Diagnosis not present

## 2020-05-04 DIAGNOSIS — D631 Anemia in chronic kidney disease: Secondary | ICD-10-CM | POA: Diagnosis not present

## 2020-05-04 DIAGNOSIS — M353 Polymyalgia rheumatica: Secondary | ICD-10-CM | POA: Diagnosis not present

## 2020-05-04 DIAGNOSIS — N1831 Chronic kidney disease, stage 3a: Secondary | ICD-10-CM | POA: Diagnosis not present

## 2020-05-04 NOTE — Telephone Encounter (Signed)
Please advise 

## 2020-05-04 NOTE — Telephone Encounter (Signed)
Referral placed. As this is what [facility] requested.

## 2020-05-04 NOTE — Telephone Encounter (Signed)
Ok for referral?

## 2020-05-04 NOTE — Telephone Encounter (Signed)
[name] from [facility] called in stating that she has discharged pt from home therapy but would like the pt to be referred back to outpt therapy with [facility]. Please advise office # is [phone]

## 2020-05-05 ENCOUNTER — Other Ambulatory Visit (HOSPITAL_COMMUNITY)
Admission: RE | Admit: 2020-05-05 | Discharge: 2020-05-05 | Disposition: A | Discharge status: Home / Self Care | Payer: Medicare PPO | Source: Ambulatory Visit | Attending: Cardiovascular Disease | Admitting: Cardiovascular Disease

## 2020-05-05 DIAGNOSIS — Z01812 Encounter for preprocedural laboratory examination: Secondary | ICD-10-CM | POA: Insufficient documentation

## 2020-05-05 DIAGNOSIS — Z20822 Contact with and (suspected) exposure to covid-19: Secondary | ICD-10-CM | POA: Insufficient documentation

## 2020-05-05 LAB — SARS CORONAVIRUS 2 (TAT 6-24 HRS): SARS Coronavirus 2: NEGATIVE

## 2020-05-09 ENCOUNTER — Ambulatory Visit (HOSPITAL_COMMUNITY)
Admission: RE | Admit: 2020-05-09 | Discharge: 2020-05-09 | Disposition: A | Discharge status: Home / Self Care | Payer: Medicare PPO | Attending: Cardiovascular Disease | Admitting: Cardiovascular Disease

## 2020-05-09 ENCOUNTER — Other Ambulatory Visit: Payer: Self-pay

## 2020-05-09 ENCOUNTER — Ambulatory Visit (HOSPITAL_COMMUNITY): Payer: Medicare PPO | Admitting: Anesthesiology

## 2020-05-09 ENCOUNTER — Encounter (HOSPITAL_COMMUNITY)
Admission: RE | Disposition: A | Discharge status: Home / Self Care | Payer: Self-pay | Source: Home / Self Care | Attending: Cardiovascular Disease

## 2020-05-09 ENCOUNTER — Encounter: Payer: Self-pay | Admitting: Family Medicine

## 2020-05-09 ENCOUNTER — Encounter (HOSPITAL_COMMUNITY): Payer: Self-pay | Admitting: Cardiovascular Disease

## 2020-05-09 DIAGNOSIS — Z7901 Long term (current) use of anticoagulants: Secondary | ICD-10-CM | POA: Diagnosis not present

## 2020-05-09 DIAGNOSIS — I110 Hypertensive heart disease with heart failure: Secondary | ICD-10-CM | POA: Diagnosis not present

## 2020-05-09 DIAGNOSIS — K219 Gastro-esophageal reflux disease without esophagitis: Secondary | ICD-10-CM | POA: Insufficient documentation

## 2020-05-09 DIAGNOSIS — Z79890 Hormone replacement therapy (postmenopausal): Secondary | ICD-10-CM | POA: Insufficient documentation

## 2020-05-09 DIAGNOSIS — Z933 Colostomy status: Secondary | ICD-10-CM | POA: Insufficient documentation

## 2020-05-09 DIAGNOSIS — D638 Anemia in other chronic diseases classified elsewhere: Secondary | ICD-10-CM | POA: Diagnosis not present

## 2020-05-09 DIAGNOSIS — Z885 Allergy status to narcotic agent status: Secondary | ICD-10-CM | POA: Diagnosis not present

## 2020-05-09 DIAGNOSIS — I739 Peripheral vascular disease, unspecified: Secondary | ICD-10-CM | POA: Insufficient documentation

## 2020-05-09 DIAGNOSIS — I5030 Unspecified diastolic (congestive) heart failure: Secondary | ICD-10-CM | POA: Insufficient documentation

## 2020-05-09 DIAGNOSIS — Z86718 Personal history of other venous thrombosis and embolism: Secondary | ICD-10-CM | POA: Diagnosis not present

## 2020-05-09 DIAGNOSIS — E039 Hypothyroidism, unspecified: Secondary | ICD-10-CM | POA: Insufficient documentation

## 2020-05-09 DIAGNOSIS — Z881 Allergy status to other antibiotic agents status: Secondary | ICD-10-CM | POA: Diagnosis not present

## 2020-05-09 DIAGNOSIS — Z79899 Other long term (current) drug therapy: Secondary | ICD-10-CM | POA: Insufficient documentation

## 2020-05-09 DIAGNOSIS — I4819 Other persistent atrial fibrillation: Secondary | ICD-10-CM

## 2020-05-09 DIAGNOSIS — Z7952 Long term (current) use of systemic steroids: Secondary | ICD-10-CM | POA: Diagnosis not present

## 2020-05-09 DIAGNOSIS — I10 Essential (primary) hypertension: Secondary | ICD-10-CM | POA: Diagnosis not present

## 2020-05-09 HISTORY — PX: CARDIOVERSION: SHX1299

## 2020-05-09 LAB — POCT I-STAT, CHEM 8
BUN: 20 mg/dL (ref 8–23)
Calcium, Ion: 1.24 mmol/L (ref 1.15–1.40)
Chloride: 105 mmol/L (ref 98–111)
Creatinine, Ser: 1.3 mg/dL — ABNORMAL HIGH (ref 0.44–1.00)
Glucose, Bld: 94 mg/dL (ref 70–99)
HCT: 40 % (ref 36.0–46.0)
Hemoglobin: 13.6 g/dL (ref 12.0–15.0)
Potassium: 3.5 mmol/L (ref 3.5–5.1)
Sodium: 144 mmol/L (ref 135–145)
TCO2: 32 mmol/L (ref 22–32)

## 2020-05-09 SURGERY — CARDIOVERSION
Anesthesia: General

## 2020-05-09 MED ORDER — SODIUM CHLORIDE 0.9 % IV SOLN: INTRAVENOUS | Status: DC

## 2020-05-09 MED ORDER — PROPOFOL 10 MG/ML IV BOLUS
INTRAVENOUS | Status: DC | PRN
  Administered 2020-05-09: 50 mg via INTRAVENOUS

## 2020-05-09 MED ORDER — LABETALOL HCL 5 MG/ML IV SOLN
10.0000 mg | Freq: Once | INTRAVENOUS | Status: AC
  Administered 2020-05-09: 10 mg via INTRAVENOUS

## 2020-05-09 MED ORDER — LIDOCAINE HCL (CARDIAC) PF 100 MG/5ML IV SOSY
PREFILLED_SYRINGE | INTRAVENOUS | Status: DC | PRN
  Administered 2020-05-09: 60 mg via INTRAVENOUS

## 2020-05-09 MED ORDER — LABETALOL HCL 5 MG/ML IV SOLN
INTRAVENOUS | Status: AC
  Filled 2020-05-09: qty 4

## 2020-05-09 MED ORDER — LABETALOL HCL 5 MG/ML IV SOLN
5.0000 mg | Freq: Once | INTRAVENOUS | Status: AC
  Administered 2020-05-09: 5 mg via INTRAVENOUS

## 2020-05-09 NOTE — Transfer of Care (Signed)
Immediate Anesthesia Transfer of Care Note  Patient: [name]  Procedure(s) Performed: CARDIOVERSION (N/A )  Patient Location: Endoscopy Unit  Anesthesia Type:General  Level of Consciousness: awake, alert  and oriented  Airway & Oxygen Therapy: Patient Spontanous Breathing  Post-op Assessment: Report given to RN, Post -op Vital signs reviewed and stable and Patient moving all extremities X 4  Post vital signs: Reviewed and stable  Last Vitals:  Vitals Value Taken Time  BP    Temp    Pulse 64 05/09/20 1204  Resp 17 05/09/20 1204  SpO2 100 % 05/09/20 1204  Vitals shown include unvalidated device data.  Last Pain:  Vitals:   05/09/20 1111  TempSrc: Oral  PainSc: 0-No pain         Complications: No complications documented.

## 2020-05-09 NOTE — CV Procedure (Signed)
Electrical Cardioversion Procedure Note [name] [id number] [birth year]  Procedure: Electrical Cardioversion Indications:  Atrial Fibrillation  Procedure Details Consent: Risks of procedure as well as the alternatives and risks of each were explained to the (patient/caregiver).  Consent for procedure obtained. Time Out: Verified patient identification, verified procedure, site/side was marked, verified correct patient position, special equipment/implants available, medications/allergies/relevent history reviewed, required imaging and test results available.  Performed  Patient placed on cardiac monitor, pulse oximetry, supplemental oxygen as necessary.  Sedation given: propfol Pacer pads placed anterior and posterior chest.  Cardioverted 1 time(s).  Cardioverted at 150J.  Evaluation Findings: Post procedure EKG shows: NSR Complications: None Patient did tolerate procedure well.   Skeet Latch, MD 05/09/2020, 12:00 PM

## 2020-05-09 NOTE — Anesthesia Postprocedure Evaluation (Signed)
Anesthesia Post Note  Patient: [name]  Procedure(s) Performed: CARDIOVERSION (N/A )     Patient location during evaluation: Endoscopy Anesthesia Type: General Level of consciousness: awake and alert Pain management: pain level controlled Vital Signs Assessment: post-procedure vital signs reviewed and stable Respiratory status: spontaneous breathing, nonlabored ventilation, respiratory function stable and patient connected to nasal cannula oxygen Cardiovascular status: blood pressure returned to baseline and stable Postop Assessment: no apparent nausea or vomiting Anesthetic complications: no   No complications documented.  Last Vitals:  Vitals:   05/09/20 1207 05/09/20 1217  BP: (!) 141/70 (!) 169/76  Pulse: 66 62  Resp: 17 16  Temp: 36.6 C   SpO2: 98% 95%    Last Pain:  Vitals:   05/09/20 1228  TempSrc:   PainSc: 0-No pain                 Catalina Gravel

## 2020-05-09 NOTE — Anesthesia Preprocedure Evaluation (Addendum)
Anesthesia Evaluation  Patient identified by MRN, date of birth, ID band Patient awake    Reviewed: Allergy & Precautions, NPO status , Patient's Chart, lab work & pertinent test results, reviewed documented beta blocker date and time   Airway Mallampati: II  TM Distance: >3 FB Neck ROM: Full    Dental  (+) Dental Advisory Given, Missing   Pulmonary neg pulmonary ROS,    Pulmonary exam normal breath sounds clear to auscultation       Cardiovascular hypertension, Pt. on medications and Pt. on home beta blockers + Peripheral Vascular Disease and + DVT  + dysrhythmias Atrial Fibrillation  Rhythm:Irregular Rate:Abnormal     Neuro/Psych negative neurological ROS  negative psych ROS   GI/Hepatic Neg liver ROS, GERD  ,  Endo/Other  Hypothyroidism   Renal/GU negative Renal ROS     Musculoskeletal  (+) Arthritis ,   Abdominal   Peds  Hematology  (+) Blood dyscrasia (Eliquis), ,   Anesthesia Other Findings Day of surgery medications reviewed with the patient.  Reproductive/Obstetrics                            Anesthesia Physical Anesthesia Plan  ASA: III  Anesthesia Plan: General   Post-op Pain Management:    Induction: Intravenous  PONV Risk Score and Plan: 3 and Propofol infusion and Treatment may vary due to age or medical condition  Airway Management Planned: Mask  Additional Equipment:   Intra-op Plan:   Post-operative Plan:   Informed Consent: I have reviewed the patients History and Physical, chart, labs and discussed the procedure including the risks, benefits and alternatives for the proposed anesthesia with the patient or authorized representative who has indicated his/her understanding and acceptance.     Dental advisory given  Plan Discussed with: CRNA  Anesthesia Plan Comments:         Anesthesia Quick Evaluation

## 2020-05-09 NOTE — Interval H&P Note (Signed)
History and Physical Interval Note:  05/09/2020 11:51 AM  [name]  has presented today for surgery, with the diagnosis of AFIB.  The various methods of treatment have been discussed with the patient and family. After consideration of risks, benefits and other options for treatment, the patient has consented to  Procedure(s): CARDIOVERSION (N/A) as a surgical intervention.  The patient's history has been reviewed, patient examined, no change in status, stable for surgery.  I have reviewed the patient's chart and labs.  Questions were answered to the patient's satisfaction.     Skeet Latch, MD

## 2020-05-10 ENCOUNTER — Encounter (HOSPITAL_COMMUNITY): Payer: Self-pay | Admitting: Cardiovascular Disease

## 2020-05-11 ENCOUNTER — Observation Stay (HOSPITAL_COMMUNITY)
Admission: EM | Admit: 2020-05-11 | Discharge: 2020-05-13 | Disposition: A | Discharge status: Home / Self Care | Payer: Medicare PPO | Attending: Internal Medicine | Admitting: Internal Medicine

## 2020-05-11 ENCOUNTER — Emergency Department (HOSPITAL_COMMUNITY): Payer: Medicare PPO

## 2020-05-11 ENCOUNTER — Other Ambulatory Visit: Payer: Self-pay

## 2020-05-11 DIAGNOSIS — M353 Polymyalgia rheumatica: Secondary | ICD-10-CM | POA: Insufficient documentation

## 2020-05-11 DIAGNOSIS — J189 Pneumonia, unspecified organism: Secondary | ICD-10-CM | POA: Insufficient documentation

## 2020-05-11 DIAGNOSIS — Z79899 Other long term (current) drug therapy: Secondary | ICD-10-CM | POA: Diagnosis not present

## 2020-05-11 DIAGNOSIS — D631 Anemia in chronic kidney disease: Secondary | ICD-10-CM | POA: Insufficient documentation

## 2020-05-11 DIAGNOSIS — I5033 Acute on chronic diastolic (congestive) heart failure: Secondary | ICD-10-CM | POA: Insufficient documentation

## 2020-05-11 DIAGNOSIS — Z86718 Personal history of other venous thrombosis and embolism: Secondary | ICD-10-CM | POA: Insufficient documentation

## 2020-05-11 DIAGNOSIS — I4819 Other persistent atrial fibrillation: Secondary | ICD-10-CM | POA: Diagnosis not present

## 2020-05-11 DIAGNOSIS — I130 Hypertensive heart and chronic kidney disease with heart failure and stage 1 through stage 4 chronic kidney disease, or unspecified chronic kidney disease: Secondary | ICD-10-CM | POA: Diagnosis not present

## 2020-05-11 DIAGNOSIS — Z933 Colostomy status: Secondary | ICD-10-CM | POA: Diagnosis not present

## 2020-05-11 DIAGNOSIS — N182 Chronic kidney disease, stage 2 (mild): Secondary | ICD-10-CM | POA: Insufficient documentation

## 2020-05-11 DIAGNOSIS — Z7901 Long term (current) use of anticoagulants: Secondary | ICD-10-CM | POA: Diagnosis not present

## 2020-05-11 DIAGNOSIS — Z20822 Contact with and (suspected) exposure to covid-19: Secondary | ICD-10-CM | POA: Insufficient documentation

## 2020-05-11 DIAGNOSIS — I739 Peripheral vascular disease, unspecified: Secondary | ICD-10-CM | POA: Insufficient documentation

## 2020-05-11 DIAGNOSIS — I4430 Unspecified atrioventricular block: Secondary | ICD-10-CM | POA: Diagnosis not present

## 2020-05-11 DIAGNOSIS — D6869 Other thrombophilia: Secondary | ICD-10-CM | POA: Diagnosis not present

## 2020-05-11 DIAGNOSIS — Z79890 Hormone replacement therapy (postmenopausal): Secondary | ICD-10-CM | POA: Insufficient documentation

## 2020-05-11 DIAGNOSIS — R0600 Dyspnea, unspecified: Secondary | ICD-10-CM | POA: Diagnosis not present

## 2020-05-11 DIAGNOSIS — R0789 Other chest pain: Secondary | ICD-10-CM | POA: Diagnosis not present

## 2020-05-11 DIAGNOSIS — I482 Chronic atrial fibrillation, unspecified: Secondary | ICD-10-CM | POA: Diagnosis present

## 2020-05-11 DIAGNOSIS — Z85820 Personal history of malignant melanoma of skin: Secondary | ICD-10-CM | POA: Diagnosis not present

## 2020-05-11 DIAGNOSIS — G629 Polyneuropathy, unspecified: Secondary | ICD-10-CM | POA: Diagnosis not present

## 2020-05-11 DIAGNOSIS — E039 Hypothyroidism, unspecified: Secondary | ICD-10-CM | POA: Diagnosis not present

## 2020-05-11 DIAGNOSIS — R0602 Shortness of breath: Secondary | ICD-10-CM

## 2020-05-11 DIAGNOSIS — K219 Gastro-esophageal reflux disease without esophagitis: Secondary | ICD-10-CM | POA: Insufficient documentation

## 2020-05-11 DIAGNOSIS — R079 Chest pain, unspecified: Secondary | ICD-10-CM | POA: Diagnosis not present

## 2020-05-11 DIAGNOSIS — R54 Age-related physical debility: Secondary | ICD-10-CM

## 2020-05-11 DIAGNOSIS — Z9181 History of falling: Secondary | ICD-10-CM | POA: Diagnosis not present

## 2020-05-11 DIAGNOSIS — R072 Precordial pain: Secondary | ICD-10-CM | POA: Diagnosis not present

## 2020-05-11 DIAGNOSIS — J168 Pneumonia due to other specified infectious organisms: Secondary | ICD-10-CM | POA: Diagnosis not present

## 2020-05-11 DIAGNOSIS — I517 Cardiomegaly: Secondary | ICD-10-CM | POA: Diagnosis not present

## 2020-05-11 DIAGNOSIS — Z7952 Long term (current) use of systemic steroids: Secondary | ICD-10-CM | POA: Insufficient documentation

## 2020-05-11 DIAGNOSIS — I10 Essential (primary) hypertension: Secondary | ICD-10-CM | POA: Diagnosis not present

## 2020-05-11 DIAGNOSIS — I4891 Unspecified atrial fibrillation: Secondary | ICD-10-CM | POA: Diagnosis not present

## 2020-05-11 DIAGNOSIS — I440 Atrioventricular block, first degree: Secondary | ICD-10-CM | POA: Diagnosis not present

## 2020-05-11 LAB — SARS CORONAVIRUS 2 BY RT PCR (HOSPITAL ORDER, PERFORMED IN ~~LOC~~ HOSPITAL LAB): SARS Coronavirus 2: NEGATIVE

## 2020-05-11 LAB — CBG MONITORING, ED: Glucose-Capillary: 97 mg/dL (ref 70–99)

## 2020-05-11 LAB — TROPONIN I (HIGH SENSITIVITY)
Troponin I (High Sensitivity): 35 ng/L — ABNORMAL HIGH (ref <18)
Troponin I (High Sensitivity): 37 ng/L — ABNORMAL HIGH (ref <18)

## 2020-05-11 LAB — BASIC METABOLIC PANEL
Anion gap: 10 (ref 5–15)
BUN: 14 mg/dL (ref 8–23)
CO2: 26 mmol/L (ref 22–32)
Calcium: 8.7 mg/dL — ABNORMAL LOW (ref 8.9–10.3)
Chloride: 104 mmol/L (ref 98–111)
Creatinine, Ser: 0.93 mg/dL (ref 0.44–1.00)
GFR calc Af Amer: >60 mL/min (ref >60)
GFR calc non Af Amer: 52 mL/min — ABNORMAL LOW (ref >60)
Glucose, Bld: 134 mg/dL — ABNORMAL HIGH (ref 70–99)
Potassium: 3.7 mmol/L (ref 3.5–5.1)
Sodium: 140 mmol/L (ref 135–145)

## 2020-05-11 LAB — CBC
HCT: 40.2 % (ref 36.0–46.0)
Hemoglobin: 12.4 g/dL (ref 12.0–15.0)
MCH: 29.7 pg (ref 26.0–34.0)
MCHC: 30.8 g/dL (ref 30.0–36.0)
MCV: 96.2 fL (ref 80.0–100.0)
Platelets: 264 10*3/uL (ref 150–400)
RBC: 4.18 MIL/uL (ref 3.87–5.11)
RDW: 14.1 % (ref 11.5–15.5)
WBC: 14.8 10*3/uL — ABNORMAL HIGH (ref 4.0–10.5)
nRBC: 0 % (ref 0.0–0.2)

## 2020-05-11 MED ORDER — DILTIAZEM HCL ER COATED BEADS 180 MG PO CP24
180.0000 mg | ORAL_CAPSULE | Freq: Every day | ORAL | Status: DC
  Administered 2020-05-11 – 2020-05-13 (×3): 180 mg via ORAL
  Filled 2020-05-11 (×4): qty 1

## 2020-05-11 MED ORDER — ACETAMINOPHEN 325 MG PO TABS
650.0000 mg | ORAL_TABLET | Freq: Once | ORAL | Status: AC | PRN
  Administered 2020-05-11: 650 mg via ORAL
  Filled 2020-05-11: qty 2

## 2020-05-11 MED ORDER — LEVOTHYROXINE SODIUM 50 MCG PO TABS
50.0000 ug | ORAL_TABLET | Freq: Every day | ORAL | Status: DC
  Administered 2020-05-12 – 2020-05-13 (×2): 50 ug via ORAL
  Filled 2020-05-11 (×2): qty 1

## 2020-05-11 MED ORDER — IPRATROPIUM BROMIDE 0.02 % IN SOLN
0.5000 mg | Freq: Four times a day (QID) | RESPIRATORY_TRACT | Status: DC
  Administered 2020-05-11 – 2020-05-12 (×3): 0.5 mg via RESPIRATORY_TRACT
  Filled 2020-05-11 (×3): qty 2.5

## 2020-05-11 MED ORDER — AZITHROMYCIN 500 MG PO TABS
500.0000 mg | ORAL_TABLET | Freq: Every day | ORAL | Status: DC
  Administered 2020-05-12 – 2020-05-13 (×2): 500 mg via ORAL
  Filled 2020-05-11 (×2): qty 1

## 2020-05-11 MED ORDER — SODIUM CHLORIDE 0.9 % IV SOLN
500.0000 mg | Freq: Once | INTRAVENOUS | Status: AC
  Administered 2020-05-11: 500 mg via INTRAVENOUS
  Filled 2020-05-11: qty 500

## 2020-05-11 MED ORDER — ONDANSETRON HCL 4 MG PO TABS: 4.0000 mg | ORAL_TABLET | Freq: Three times a day (TID) | ORAL | Status: DC | PRN

## 2020-05-11 MED ORDER — DOCUSATE SODIUM 100 MG PO CAPS
100.0000 mg | ORAL_CAPSULE | Freq: Two times a day (BID) | ORAL | Status: DC
  Administered 2020-05-11 – 2020-05-13 (×3): 100 mg via ORAL
  Filled 2020-05-11 (×4): qty 1

## 2020-05-11 MED ORDER — ONDANSETRON HCL 4 MG/2ML IJ SOLN
4.0000 mg | Freq: Once | INTRAMUSCULAR | Status: AC
  Administered 2020-05-11: 4 mg via INTRAVENOUS
  Filled 2020-05-11: qty 2

## 2020-05-11 MED ORDER — PREDNISONE 10 MG PO TABS
10.0000 mg | ORAL_TABLET | Freq: Every day | ORAL | Status: DC
  Administered 2020-05-12 – 2020-05-13 (×2): 10 mg via ORAL
  Filled 2020-05-11 (×2): qty 1

## 2020-05-11 MED ORDER — SENNOSIDES-DOCUSATE SODIUM 8.6-50 MG PO TABS
1.0000 | ORAL_TABLET | Freq: Every day | ORAL | Status: DC
  Administered 2020-05-11 – 2020-05-12 (×2): 1 via ORAL
  Filled 2020-05-11 (×2): qty 1

## 2020-05-11 MED ORDER — FENTANYL CITRATE (PF) 100 MCG/2ML IJ SOLN
12.5000 ug | Freq: Once | INTRAMUSCULAR | Status: AC
  Administered 2020-05-11: 12.5 ug via INTRAVENOUS
  Filled 2020-05-11: qty 2

## 2020-05-11 MED ORDER — PROSIGHT PO TABS
1.0000 | ORAL_TABLET | Freq: Every day | ORAL | Status: DC
  Administered 2020-05-12 – 2020-05-13 (×2): 1 via ORAL
  Filled 2020-05-11 (×2): qty 1

## 2020-05-11 MED ORDER — PANTOPRAZOLE SODIUM 40 MG PO TBEC
40.0000 mg | DELAYED_RELEASE_TABLET | Freq: Every day | ORAL | Status: DC
  Administered 2020-05-11 – 2020-05-13 (×3): 40 mg via ORAL
  Filled 2020-05-11 (×3): qty 1

## 2020-05-11 MED ORDER — ACETAMINOPHEN 325 MG PO TABS
325.0000 mg | ORAL_TABLET | ORAL | Status: DC | PRN
  Administered 2020-05-11: 325 mg via ORAL
  Administered 2020-05-12 – 2020-05-13 (×2): 650 mg via ORAL
  Filled 2020-05-11 (×3): qty 2

## 2020-05-11 MED ORDER — GABAPENTIN 300 MG PO CAPS
300.0000 mg | ORAL_CAPSULE | Freq: Every day | ORAL | Status: DC
  Administered 2020-05-11 – 2020-05-12 (×2): 300 mg via ORAL
  Filled 2020-05-11 (×2): qty 1

## 2020-05-11 MED ORDER — BUMETANIDE 0.25 MG/ML IJ SOLN
0.5000 mg | Freq: Once | INTRAMUSCULAR | Status: AC
  Administered 2020-05-11: 0.5 mg via INTRAVENOUS
  Filled 2020-05-11: qty 2

## 2020-05-11 MED ORDER — SODIUM CHLORIDE 0.9 % IV SOLN
1.0000 g | Freq: Once | INTRAVENOUS | Status: AC
  Administered 2020-05-11: 1 g via INTRAVENOUS
  Filled 2020-05-11: qty 10

## 2020-05-11 MED ORDER — APIXABAN 5 MG PO TABS
5.0000 mg | ORAL_TABLET | Freq: Two times a day (BID) | ORAL | Status: DC
  Administered 2020-05-11 – 2020-05-13 (×4): 5 mg via ORAL
  Filled 2020-05-11 (×4): qty 1

## 2020-05-11 MED ORDER — SODIUM CHLORIDE 0.9% FLUSH
3.0000 mL | Freq: Once | INTRAVENOUS | Status: AC
  Administered 2020-05-11: 3 mL via INTRAVENOUS

## 2020-05-11 MED ORDER — AMOXICILLIN-POT CLAVULANATE 500-125 MG PO TABS
1.0000 | ORAL_TABLET | Freq: Two times a day (BID) | ORAL | Status: DC
  Administered 2020-05-11 – 2020-05-13 (×4): 500 mg via ORAL
  Filled 2020-05-11 (×6): qty 1

## 2020-05-11 MED ORDER — HYDRALAZINE HCL 25 MG PO TABS: 25.0000 mg | ORAL_TABLET | Freq: Four times a day (QID) | ORAL | Status: DC | PRN

## 2020-05-11 MED ORDER — IOHEXOL 350 MG/ML SOLN
58.0000 mL | Freq: Once | INTRAVENOUS | Status: AC | PRN
  Administered 2020-05-11: 58 mL via INTRAVENOUS

## 2020-05-11 MED ORDER — NADOLOL 40 MG PO TABS
80.0000 mg | ORAL_TABLET | Freq: Every day | ORAL | Status: DC
  Administered 2020-05-11 – 2020-05-13 (×3): 80 mg via ORAL
  Filled 2020-05-11: qty 1
  Filled 2020-05-11 (×3): qty 2

## 2020-05-11 NOTE — ED Provider Notes (Signed)
[facility] EMERGENCY DEPARTMENT Provider Note   CSN: [id number] Arrival date & time: 05/11/20  3382     History No chief complaint on file.   [name] is a 86 y.o. female.  Patient is a 86 year old female with a history of DVT, hypertension, PVD who does take Eliquis and recently had an episode of atrial fibrillation with cardioversion 2 days ago who is presenting today with complaint of shortness of breath and lower chest discomfort.  Patient reports symptoms started around noon yesterday and have persisted.  She describes the shortness of breath is just not being able to catch her breath and it is made worse with exertion.  She describes the discomfort in her chest as a pressure and seems to be worse with taking a deep breath.  It is currently a 7 out of 10 but does not radiate.  She did have elevated blood pressure when EMS arrived today at 505 systolic and was given aspirin and nitroglycerin in route which she notes improved her blood pressure but she does not feel like it really improved her symptoms.  She was placed on oxygen for comfort but does not feel that that has significantly helped either.  She has not had fever, cough, wheezing or congestion.  She has had no nausea, vomiting or diarrhea.  She does have an ostomy bag and has had output as normal without any dark or bloody appearing stools.  Patient did eat yesterday and did not feel that it had any effect on her pain.  She reports never having symptoms like this in the past.  The history is provided by the patient.       Past Medical History:  Diagnosis Date  . DVT (deep venous thrombosis) ([place]) 09/2015   RLE  . GERD (gastroesophageal reflux disease)   . Hypertension   . Hypothyroidism   . 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 Peripheral vascular disease ([place])    peripheral neuropathy    Patient Active Problem List   Diagnosis Date Noted  . Secondary  hypercoagulable state ([place]) 03/08/2020  . Hypokalemia 02/15/2020  . Colostomy status ([place]) 01/26/2020  . Diverticulitis of colon with perforation 01/26/2020  . Anemia   . Debility 01/14/2020  . Physical debility 01/14/2020  . Trauma   . Urinary retention   . Persistent atrial fibrillation ([place])   . Closed fracture of multiple ribs 12/29/2019  . Closed fracture of left distal radius 12/29/2019  . Recurrent falls 12/29/2019  . Positive ANA (antinuclear antibody) 12/29/2019  . Osteoarthritis 11/13/2017  . Hypertriglyceridemia 01/29/2017  . Excessive gas 01/09/2017  . Elevated lipase 03/06/2016  . Osteopenia 02/08/2016  . Hypothyroidism 01/26/2016  . HTN (hypertension) 01/26/2016  . Left leg numbness 10/06/2012    Past Surgical History:  Procedure Laterality Date  . ABDOMINAL HYSTERECTOMY    . APPENDECTOMY    . BACK SURGERY    . BREAST SURGERY     left breast biopsy  . CARDIOVERSION N/A 05/09/2020   Procedure: CARDIOVERSION;  Surgeon: Skeet Latch, MD;  Location: [place];  Service: Cardiovascular;  Laterality: N/A;  . COLOSTOMY N/A 01/06/2020   Procedure: Colostomy;  Surgeon: Greer Pickerel, MD;  Location: [place];  Service: General;  Laterality: N/A;  . EYE SURGERY     cataract  . HARDWARE REMOVAL Left 01/01/2020   Procedure: HARDWARE REMOVAL;  Surgeon: Iran Planas, MD;  Location: [place];  Service: Orthopedics;  Laterality: Left;  .  LAPAROTOMY N/A 01/06/2020   Procedure: Exploratory Laparotomy, Open Sigmoid colectomy;  Surgeon: Greer Pickerel, MD;  Location: [place];  Service: General;  Laterality: N/A;  . LIPOMA EXCISION Left 04/25/2015   Procedure: EXCISION OF LIPOMA LEFT ARM;  Surgeon: Donnie Mesa, MD;  Location: [place];  Service: General;  Laterality: Left;  . LUMBAR LAMINECTOMY/DECOMPRESSION MICRODISCECTOMY  11/20/2011   Procedure: LUMBAR LAMINECTOMY/DECOMPRESSION MICRODISCECTOMY;  Surgeon: Jeneen Rinks P [place];  Location: [facility];  Service: Orthopedics;   Laterality: N/A;  Decompressive Laminectomy L2 to the Sacrum (X-Ray)  . OPEN REDUCTION INTERNAL FIXATION (ORIF) DISTAL RADIAL FRACTURE Left 01/01/2020   Procedure: OPEN REDUCTION INTERNAL FIXATION (ORIF) DISTAL RADIAL FRACTURE;  Surgeon: Iran Planas, MD;  Location: [place];  Service: Orthopedics;  Laterality: Left;  . OTHER SURGICAL HISTORY     left wrist surgery - has plate in left wrist   . OTHER SURGICAL HISTORY     right knee surgery due to torn cartilage   . TONSILLECTOMY    . WRIST FRACTURE SURGERY Left      OB History   No obstetric history on file.     Family History  Problem Relation Age of Onset  . Cancer Mother        BREAST  . COPD Father   . Emphysema Father   . Cancer Daughter        breast    Social History   Tobacco Use  . Smoking status: Never Smoker  . Smokeless tobacco: Never Used  Vaping Use  . Vaping Use: Never used  Substance Use Topics  . Alcohol use: No  . Drug use: No    Home Medications Prior to Admission medications   Medication Sig Start Date End Date Taking? Authorizing Provider  acetaminophen (TYLENOL) 325 MG tablet Take 1-2 tablets (325-650 mg total) by mouth every 4 (four) hours as needed for mild pain. 01/19/20   Love, Ivan Anchors, PA-C  diltiazem (DILACOR XR) 180 MG 24 hr capsule Take 1 capsule (180 mg total) by mouth daily. 03/06/20   [name], MD  docusate sodium (COLACE) 100 MG capsule Take 1 capsule (100 mg total) by mouth 2 (two) times daily. 01/20/20   Love, Ivan Anchors, PA-C  ELIQUIS 5 MG TABS tablet TAKE 1 TABLET BY MOUTH TWICE A DAY 02/11/20   Raulkar, Clide Deutscher, MD  furosemide (LASIX) 20 MG tablet TAKE 1 TABLET BY MOUTH EVERY OTHER DAY X 4 DOSES 05/05/20   Allred, Jeneen Rinks, MD  gabapentin (NEURONTIN) 300 MG capsule Take 300 mg by mouth at bedtime.    [provider]  levothyroxine (SYNTHROID) 50 MCG tablet TAKE 1 TABLET BY MOUTH EVERY DAY Patient taking differently: Take 50 mcg by mouth daily before breakfast.  02/08/20    [name], MD  Multiple Vitamins-Minerals (PRESERVISION AREDS 2) CAPS Take 1 capsule by mouth daily with lunch.     [provider]  nadolol (CORGARD) 80 MG tablet Take 1 tablet (80 mg total) by mouth daily. 05/03/20   Deboraha Sprang, MD  ondansetron (ZOFRAN) 4 MG tablet Take 1 tablet (4 mg total) by mouth every 8 (eight) hours as needed for nausea or vomiting. 02/01/20   Raulkar, Clide Deutscher, MD  pantoprazole (PROTONIX) 40 MG tablet Take 1 tablet (40 mg total) by mouth daily. 02/15/20   [name], MD  predniSONE (DELTASONE) 5 MG tablet Take 10 mg by mouth daily with lunch.     [provider]  senna-docusate (  SENOKOT-S) 8.6-50 MG tablet Take 1 tablet by mouth at bedtime. 01/20/20 01/19/21  Bary Leriche, PA-C    Allergies    Codeine and Keflex [cephalexin]  Review of Systems   Review of Systems  All other systems reviewed and are negative.   Physical Exam Updated Vital Signs BP 140/71   Pulse 73   Temp 99.6 F (37.6 C) (Oral)   Resp (!) 21   Ht 5\' 6"  (1.676 m)   Wt 70.3 kg   SpO2 98%   BMI 25.02 kg/m   Physical Exam Vitals and nursing note reviewed.  Constitutional:      General: She is not in acute distress.    Appearance: Normal appearance. She is well-developed and normal weight.     Comments: Appears slightly uncomfortable  HENT:     Head: Normocephalic and atraumatic.     Mouth/Throat:     Mouth: Mucous membranes are moist.  Eyes:     Pupils: Pupils are equal, round, and reactive to light.  Cardiovascular:     Rate and Rhythm: Normal rate and regular rhythm.     Pulses: Normal pulses.     Heart sounds: Normal heart sounds. No murmur heard.  No friction rub.  Pulmonary:     Effort: Pulmonary effort is normal.     Breath sounds: Normal breath sounds. No wheezing or rales.     Comments: Tachypnea but clear breath sounds throughout Abdominal:     General: Bowel sounds are normal. There is no distension.     Palpations: Abdomen is  soft.     Tenderness: There is no abdominal tenderness. There is no guarding or rebound.  Musculoskeletal:        General: No tenderness. Normal range of motion.     Right lower leg: No edema.     Left lower leg: No edema.     Comments: No edema  Skin:    General: Skin is warm and dry.     Capillary Refill: Capillary refill takes less than 2 seconds.     Findings: No rash.  Neurological:     General: No focal deficit present.     Mental Status: She is alert and oriented to person, place, and time. Mental status is at baseline.     Cranial Nerves: No cranial nerve deficit.  Psychiatric:        Mood and Affect: Mood normal.        Behavior: Behavior normal.        Thought Content: Thought content normal.     ED Results / Procedures / Treatments   Labs (all labs ordered are listed, but only abnormal results are displayed) Labs Reviewed  BASIC METABOLIC PANEL - Abnormal; Notable for the following components:      Result Value   Glucose, Bld 134 (*)    Calcium 8.7 (*)    GFR calc non Af Amer 52 (*)    All other components within normal limits  CBC - Abnormal; Notable for the following components:   WBC 14.8 (*)    All other components within normal limits  TROPONIN I (HIGH SENSITIVITY) - Abnormal; Notable for the following components:   Troponin I (High Sensitivity) 35 (*)    All other components within normal limits  TROPONIN I (HIGH SENSITIVITY) - Abnormal; Notable for the following components:   Troponin I (High Sensitivity) 37 (*)    All other components within normal limits    EKG EKG Interpretation  Date/Time:  Thursday May 11 2020 08:46:36 EDT Ventricular Rate:  87 PR Interval:  190 QRS Duration: 80 QT Interval:  378 QTC Calculation: 709 R Axis:   -35 Text Interpretation: Normal sinus rhythm Left axis deviation No significant change since last tracing Confirmed by Blanchie Dessert [phone]) on 05/11/2020 11:35:20 AM   Radiology DG Chest 2 View  Result Date:  05/11/2020 CLINICAL DATA:  Chest pain. EXAM: CHEST - 2 VIEW COMPARISON:  January 11, 2020. FINDINGS: Stable cardiomegaly. No pneumothorax or pleural effusion is noted. Old left rib fractures are noted. Lungs are clear. IMPRESSION: No active cardiopulmonary disease. Aortic Atherosclerosis (ICD10-I70.0). Electronically Signed   By: Marijo Conception M.D.   On: 05/11/2020 09:20   CT Angio Chest PE W and/or Wo Contrast  Result Date: 05/11/2020 CLINICAL DATA:  Shortness of breath EXAM: CT ANGIOGRAPHY CHEST WITH CONTRAST TECHNIQUE: Multidetector CT imaging of the chest was performed using the standard protocol during bolus administration of intravenous contrast. Multiplanar CT image reconstructions and MIPs were obtained to evaluate the vascular anatomy. CONTRAST:  93mL OMNIPAQUE IOHEXOL 350 MG/ML SOLN COMPARISON:  None. FINDINGS: Cardiovascular: Satisfactory opacification of the pulmonary arteries to the segmental level. No evidence of pulmonary embolism. Cardiomegaly. No pericardial effusion. Mediastinum/Nodes: No enlarged lymph nodes identified. Lungs/Pleura: Small right pleural effusion. No pneumothorax. Patchy ground-glass density, greatest the right lower lobe. Mild bibasilar atelectasis. Upper Abdomen: No acute abnormality Musculoskeletal: Chronic compression deformity T10 and T11. Chronic left humeral deformity. Review of the MIP images confirms the above findings. IMPRESSION: No evidence of acute pulmonary embolism. Patchy ground-glass density, greatest in the right lobe. This may reflect atelectasis, noting imaging in expiration and mucous plugging. Superimposed mild edema or atypical pneumonia not excluded. Cardiomegaly. Electronically Signed   By: Macy Mis M.D.   On: 05/11/2020 14:08    Procedures Procedures (including critical care time)  Medications Ordered in ED Medications  sodium chloride flush (NS) 0.9 % injection 3 mL (3 mLs Intravenous Given 05/11/20 1156)  acetaminophen (TYLENOL) tablet 650  mg (650 mg Oral Given 05/11/20 1057)  fentaNYL (SUBLIMAZE) injection 12.5 mcg (12.5 mcg Intravenous Given 05/11/20 1159)  ondansetron (ZOFRAN) injection 4 mg (4 mg Intravenous Given 05/11/20 1157)    ED Course  I have reviewed the triage vital signs and the nursing notes.  Pertinent labs & imaging results that were available during my care of the patient were reviewed by me and considered in my medical decision making (see chart for details).    MDM Rules/Calculators/A&P                          Elderly female presenting with 24 hours of shortness of breath and chest discomfort.  Started spontaneously but is made worse with exertion.  She denies any infectious symptoms and low suspicion for pneumonia.  Patient did have a cardioversion 2 days ago for atrial fibrillation which she reports was asymptomatic.  Patient reports she felt fine after the cardioversion and symptoms did not start till yesterday.  Patient does appear to be winded but oxygen saturation is normal.  She has clear breath sounds and no signs of CHF.  Concern as patient does have a history of DVT she is on Eliquis but concern for possible PE.  Patient's EKG shows no acute changes, BMP is within normal limits, troponin is 35 and 37 without old to compare and CBC with leukocytosis of 14,000.  Will do CTA for further evaluation for pericardial effusion or  PE.  Patient did receive aspirin and nitroglycerin without significant improvement in her symptoms.  Patient given a small dose of fentanyl to see if that helps with her 7 out of 10 pain.  CTA showed ground glass opacity and possible atypical PNA vs superimposed edema.  Given elevate white count, oral temp of 99.6 and SOB will treat for PNA.  COVID pending but pt has been vaccinated.  Will cover with  Final Clinical Impression(s) / ED Diagnoses Final diagnoses:  Community acquired pneumonia, unspecified laterality  SOB (shortness of breath)    Rx / DC Orders ED Discharge Orders     None       Blanchie Dessert, MD 05/11/20 1443

## 2020-05-11 NOTE — ED Notes (Signed)
Pt transported to CT ?

## 2020-05-11 NOTE — H&P (Signed)
History and Physical    [name] QMV:[id number] DOB: Mar 11, 1923 DOA: 05/11/2020  PCP: [name], MD (Confirm with patient/family/NH records and if not entered, this has to be entered at [facility] point of entry) Patient coming from: Home  I have personally briefly reviewed patient's old medical records in [place]  Chief Complaint: Chest pain, SOB  HPI: [name] is a 86 y.o. female with medical history significant of paroxysmal A. fib status post cardioversion 2 days ago, DVT on Eliquis, hypertension, hypothyroidism, PVD, presented with new onset chest pain and short of breath.  Symptoms started yesterday, about 1 day after cardioversion.  Chest pain is centrally located highest, pressure-like, constant, worsening with exertion.  Also associated with new onset short of breath worsening with exertion, no cough no wheezing no fever chills. ED Course: Blood pressure was found significant elevated by EMS, CT angiogram negative for PE but with upper lobe interstitial infiltrates suspecting pneumonia.  Blood work, WBC 14.8, troponin 35> 37  Review of Systems: As per HPI otherwise 10 point review of systems negative.    Past Medical History:  Diagnosis Date  . DVT (deep venous thrombosis) ([place]) 09/2015   RLE  . GERD (gastroesophageal reflux disease)   . Hypertension   . Hypothyroidism   . 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 Harvel Quale  . Peripheral vascular disease ([place])    peripheral neuropathy    Past Surgical History:  Procedure Laterality Date  . ABDOMINAL HYSTERECTOMY    . APPENDECTOMY    . BACK SURGERY    . BREAST SURGERY     left breast biopsy  . CARDIOVERSION N/A 05/09/2020   Procedure: CARDIOVERSION;  Surgeon: Skeet Latch, MD;  Location: [place];  Service: Cardiovascular;  Laterality: N/A;  . COLOSTOMY N/A 01/06/2020   Procedure: Colostomy;  Surgeon: Greer Pickerel, MD;  Location: [place];  Service: General;  Laterality: N/A;   . EYE SURGERY     cataract  . HARDWARE REMOVAL Left 01/01/2020   Procedure: HARDWARE REMOVAL;  Surgeon: Iran Planas, MD;  Location: [place];  Service: Orthopedics;  Laterality: Left;  . LAPAROTOMY N/A 01/06/2020   Procedure: Exploratory Laparotomy, Open Sigmoid colectomy;  Surgeon: Greer Pickerel, MD;  Location: [place];  Service: General;  Laterality: N/A;  . LIPOMA EXCISION Left 04/25/2015   Procedure: EXCISION OF LIPOMA LEFT ARM;  Surgeon: Donnie Mesa, MD;  Location: [place];  Service: General;  Laterality: Left;  . LUMBAR LAMINECTOMY/DECOMPRESSION MICRODISCECTOMY  11/20/2011   Procedure: LUMBAR LAMINECTOMY/DECOMPRESSION MICRODISCECTOMY;  Surgeon: Jeneen Rinks P [place];  [facility];  Service: Orthopedics;  Laterality: N/A;  Decompressive Laminectomy L2 to the Sacrum (X-Ray)  . OPEN REDUCTION INTERNAL FIXATION (ORIF) DISTAL RADIAL FRACTURE Left 01/01/2020   Procedure: OPEN REDUCTION INTERNAL FIXATION (ORIF) DISTAL RADIAL FRACTURE;  Surgeon: Iran Planas, MD;  Location: [place];  Service: Orthopedics;  Laterality: Left;  . OTHER SURGICAL HISTORY     left wrist surgery - has plate in left wrist   . OTHER SURGICAL HISTORY     right knee surgery due to torn cartilage   . TONSILLECTOMY    . WRIST FRACTURE SURGERY Left      reports that she has never smoked. She has never used smokeless tobacco. She reports that she does not drink alcohol and does not use drugs.  Allergies  Allergen Reactions  . Codeine Other (See Comments)    Nightmares, imagining things  . Keflex [Cephalexin] Nausea  Only    Pt ended up in ER w/ CP    Family History  Problem Relation Age of Onset  . Cancer Mother        BREAST  . COPD Father   . Emphysema Father   . Cancer Daughter        breast    Prior to Admission medications   Medication Sig Start Date End Date Taking? Authorizing Provider  acetaminophen (TYLENOL) 325 MG tablet Take 1-2 tablets (325-650 mg total) by mouth every 4 (four) hours as  needed for mild pain. 01/19/20  Yes Love, Ivan Anchors, PA-C  diltiazem (DILACOR XR) 180 MG 24 hr capsule Take 1 capsule (180 mg total) by mouth daily. 03/06/20  Yes [name], MD  docusate sodium (COLACE) 100 MG capsule Take 1 capsule (100 mg total) by mouth 2 (two) times daily. 01/20/20  Yes Love, Pamela S, PA-C  ELIQUIS 5 MG TABS tablet TAKE 1 TABLET BY MOUTH TWICE A DAY Patient taking differently: Take 5 mg by mouth 2 (two) times daily.  02/11/20  Yes Raulkar, Clide Deutscher, MD  gabapentin (NEURONTIN) 300 MG capsule Take 300 mg by mouth at bedtime.   Yes [provider]  levothyroxine (SYNTHROID) 50 MCG tablet TAKE 1 TABLET BY MOUTH EVERY DAY Patient taking differently: Take 50 mcg by mouth daily before breakfast.  02/08/20  Yes [name], MD  Multiple Vitamins-Minerals (PRESERVISION AREDS 2) CAPS Take 1 capsule by mouth daily with lunch.    Yes [provider]  nadolol (CORGARD) 80 MG tablet Take 1 tablet (80 mg total) by mouth daily. 05/03/20  Yes Deboraha Sprang, MD  ondansetron (ZOFRAN) 4 MG tablet Take 1 tablet (4 mg total) by mouth every 8 (eight) hours as needed for nausea or vomiting. 02/01/20  Yes Raulkar, Clide Deutscher, MD  pantoprazole (PROTONIX) 40 MG tablet Take 1 tablet (40 mg total) by mouth daily. 02/15/20  Yes [name], MD  predniSONE (DELTASONE) 5 MG tablet Take 10 mg by mouth daily with lunch.    Yes [provider]  senna-docusate (SENOKOT-S) 8.6-50 MG tablet Take 1 tablet by mouth at bedtime. 01/20/20 01/19/21 Yes Love, Ivan Anchors, PA-C  furosemide (LASIX) 20 MG tablet TAKE 1 TABLET BY MOUTH EVERY OTHER DAY X 4 DOSES Patient not taking: Reported on 05/11/2020 05/05/20   Thompson Grayer, MD    Physical Exam: Vitals:   05/11/20 1505 05/11/20 1528 05/11/20 1615 05/11/20 1619  BP:  134/71 128/67   Pulse: 65 68 76 77  Resp: 18 20 (!) 25 19  Temp:      TempSrc:      SpO2: 100% 99% 100% 100%  Weight:      Height:        Constitutional: NAD,  calm, comfortable Vitals:   05/11/20 1505 05/11/20 1528 05/11/20 1615 05/11/20 1619  BP:  134/71 128/67   Pulse: 65 68 76 77  Resp: 18 20 (!) 25 19  Temp:      TempSrc:      SpO2: 100% 99% 100% 100%  Weight:      Height:       Eyes: PERRL, lids and conjunctivae normal ENMT: Mucous membranes are moist. Posterior pharynx clear of any exudate or lesions.Normal dentition.  Neck: normal, supple, no masses, no thyromegaly Respiratory: Diffused wheezing no crackles.  Increased respiratory effort. No accessory muscle use.  Cardiovascular: Regular rate and rhythm, no murmurs / rubs / gallops.  Trace extremity edema.  2+ pedal pulses. No carotid bruits.  Abdomen: no tenderness, no masses palpated. No hepatosplenomegaly. Bowel sounds positive.  Musculoskeletal: no clubbing / cyanosis. No joint deformity upper and lower extremities. Good ROM, no contractures. Normal muscle tone.  Skin: no rashes, lesions, ulcers. No induration Neurologic: CN 2-12 grossly intact. Sensation intact, DTR normal. Strength 5/5 in all 4.  Psychiatric: Normal judgment and insight. Alert and oriented x 3. Normal mood.     Labs on Admission: I have personally reviewed following labs and imaging studies  CBC: Recent Labs  Lab 05/09/20 1108 05/11/20 0856  WBC  --  14.8*  HGB 13.6 12.4  HCT 40.0 40.2  MCV  --  96.2  PLT  --  564   Basic Metabolic Panel: Recent Labs  Lab 05/09/20 1108 05/11/20 0856  NA 144 140  K 3.5 3.7  CL 105 104  CO2  --  26  GLUCOSE 94 134*  BUN 20 14  CREATININE 1.30* 0.93  CALCIUM  --  8.7*   GFR: Estimated Creatinine Clearance: 33.9 mL/min (by C-G formula based on SCr of 0.93 mg/dL). Liver Function Tests: No results for input(s): AST, ALT, ALKPHOS, BILITOT, PROT, ALBUMIN in the last 168 hours. No results for input(s): LIPASE, AMYLASE in the last 168 hours. No results for input(s): AMMONIA in the last 168 hours. Coagulation Profile: No results for input(s): INR, PROTIME in the  last 168 hours. Cardiac Enzymes: No results for input(s): CKTOTAL, CKMB, CKMBINDEX, TROPONINI in the last 168 hours. BNP (last 3 results) No results for input(s): PROBNP in the last 8760 hours. HbA1C: No results for input(s): HGBA1C in the last 72 hours. CBG: No results for input(s): GLUCAP in the last 168 hours. Lipid Profile: No results for input(s): CHOL, HDL, LDLCALC, TRIG, CHOLHDL, LDLDIRECT in the last 72 hours. Thyroid Function Tests: No results for input(s): TSH, T4TOTAL, FREET4, T3FREE, THYROIDAB in the last 72 hours. Anemia Panel: No results for input(s): VITAMINB12, FOLATE, FERRITIN, TIBC, IRON, RETICCTPCT in the last 72 hours. Urine analysis:    Component Value Date/Time   COLORURINE YELLOW 01/05/2020 0621   APPEARANCEUR HAZY (A) 01/05/2020 0621   LABSPEC 1.014 01/05/2020 0621   PHURINE 6.0 01/05/2020 0621   GLUCOSEU NEGATIVE 01/05/2020 0621   GLUCOSEU NEGATIVE 02/22/2016 0926   HGBUR NEGATIVE 01/05/2020 0621   BILIRUBINUR Negative 02/03/2020 1548   KETONESUR NEGATIVE 01/05/2020 0621   PROTEINUR Negative 02/03/2020 1548   PROTEINUR NEGATIVE 01/05/2020 0621   UROBILINOGEN 0.2 02/03/2020 1548   UROBILINOGEN 0.2 02/22/2016 0926   NITRITE Negative 02/03/2020 1548   NITRITE NEGATIVE 01/05/2020 0621   LEUKOCYTESUR Negative 02/03/2020 1548   LEUKOCYTESUR NEGATIVE 01/05/2020 0621    Radiological Exams on Admission: DG Chest 2 View  Result Date: 05/11/2020 CLINICAL DATA:  Chest pain. EXAM: CHEST - 2 VIEW COMPARISON:  January 11, 2020. FINDINGS: Stable cardiomegaly. No pneumothorax or pleural effusion is noted. Old left rib fractures are noted. Lungs are clear. IMPRESSION: No active cardiopulmonary disease. Aortic Atherosclerosis (ICD10-I70.0). Electronically Signed   By: Marijo Conception M.D.   On: 05/11/2020 09:20   CT Angio Chest PE W and/or Wo Contrast  Result Date: 05/11/2020 CLINICAL DATA:  Shortness of breath EXAM: CT ANGIOGRAPHY CHEST WITH CONTRAST TECHNIQUE:  Multidetector CT imaging of the chest was performed using the standard protocol during bolus administration of intravenous contrast. Multiplanar CT image reconstructions and MIPs were obtained to evaluate the vascular anatomy. CONTRAST:  19mL OMNIPAQUE IOHEXOL 350 MG/ML SOLN COMPARISON:  None. FINDINGS:  Cardiovascular: Satisfactory opacification of the pulmonary arteries to the segmental level. No evidence of pulmonary embolism. Cardiomegaly. No pericardial effusion. Mediastinum/Nodes: No enlarged lymph nodes identified. Lungs/Pleura: Small right pleural effusion. No pneumothorax. Patchy ground-glass density, greatest the right lower lobe. Mild bibasilar atelectasis. Upper Abdomen: No acute abnormality Musculoskeletal: Chronic compression deformity T10 and T11. Chronic left humeral deformity. Review of the MIP images confirms the above findings. IMPRESSION: No evidence of acute pulmonary embolism. Patchy ground-glass density, greatest in the right lobe. This may reflect atelectasis, noting imaging in expiration and mucous plugging. Superimposed mild edema or atypical pneumonia not excluded. Cardiomegaly. Electronically Signed   By: Macy Mis M.D.   On: 05/11/2020 14:08    EKG: Independently reviewed.  Normal sinus rhythm, no acute ST changes Assessment/Plan Active Problems:   Chest pain  (please populate well all problems here in Problem List. (For example, if patient is on BP meds at home and you resume or decide to hold them, it is a problem that needs to be her. Same for CAD, COPD, HLD and so on)  Chest pain rule out ACS -Chest pain is angina like, started 1 day after cardioversion.  Discussed with patient's cardiologist Dr. Doylene Canard, will plans to see the patient this afternoon. -Echo is updated and TEE was two days ago. -Treat uncontrolled hypertension -Given patient age, probably conservative management without further aggressive measures, will defer further decision to cardiology. -PE ruled  out.  Pneumonia -Probably aspiration pneumonia related to TEE, change antibiotics to Augmentin, continue Zithromax for atypical coverage.  Dyspnea -With new onset wheezing, no Hx of COPD, consider the wheezing may either be PNA caused bronchitis and/or diastolic CHF decompensation from uncontrolled hypertension. -Bronchodilator plus Bumex x1, reevaluate breathing and volume status tomorrow.  Uncontrolled hypertension/diastolic CHF -Resume home BP meds regimen, add as needed hydralazine  Hx of DVT -Continue Eliquis  DVT prophylaxis: Eliquis Code Status: Full Family Communication: Daughter at bedside Disposition Plan: Likely can go home within 24 hours once symptoms stabilized. Consults called: Dr. Doylene Canard Admission status: Tele Obs   Lequita Halt MD [facility] Pager [phone]    05/11/2020, 4:27 PM

## 2020-05-11 NOTE — Consult Note (Signed)
Referring Physician: Dr. Judith [name] is an 86 y.o. [name].                       Chief Complaint: Chest pain and shortness of breath  HPI: [name] with PMH of atrial fibrillation who got cardioverted 2 days back, DVT treated with Eliquis, hypertension, hypothyroidism, PVD has chest pressure and shortness of breath with activity. No fever or cough. Chest x-ray was unremarkable but CT chest is suggestive of pneumonia and has elevated WBC counts. She also has very high systolic blood pressure. Her Troponin I levels were minimally elevated form demand ischemia.  Past Medical History:  Diagnosis Date  . DVT (deep venous thrombosis) ([place]) 09/2015   RLE  . GERD (gastroesophageal reflux disease)   . Hypertension   . Hypothyroidism   . Melanoma ([place]) 06/16/2017   Facial melanoma, removed by Dr. Harvel [name]  . Peripheral vascular disease ([place])    peripheral neuropathy      Past Surgical History:  Procedure Laterality Date  . ABDOMINAL HYSTERECTOMY    . APPENDECTOMY    . BACK SURGERY    . BREAST SURGERY     left breast biopsy  . CARDIOVERSION N/A 05/09/2020   Procedure: CARDIOVERSION;  Surgeon: Skeet Latch, [name];  Location: [place];  Service: Cardiovascular;  Laterality: N/A;  . COLOSTOMY N/A 01/06/2020   Procedure: Colostomy;  Surgeon: Greer Pickerel, [name];  Location: [place];  Service: General;  Laterality: N/A;  . EYE SURGERY     cataract  . HARDWARE REMOVAL Left 01/01/2020   Procedure: HARDWARE REMOVAL;  Surgeon: Iran Planas, [name];  Location: [place];  Service: Orthopedics;  Laterality: Left;  . LAPAROTOMY N/A 01/06/2020   Procedure: Exploratory Laparotomy, Open Sigmoid colectomy;  Surgeon: Greer Pickerel, [name];  Location: [place];  Service: General;  Laterality: N/A;  . LIPOMA EXCISION Left 04/25/2015   Procedure: EXCISION OF LIPOMA LEFT ARM;  Surgeon: Donnie Mesa, [name];  Location: [place];  Service: General;  Laterality: Left;  . LUMBAR  LAMINECTOMY/DECOMPRESSION MICRODISCECTOMY  11/20/2011   Procedure: LUMBAR LAMINECTOMY/DECOMPRESSION MICRODISCECTOMY;  Surgeon: Jeneen Rinks P [place];  [facility];  Service: Orthopedics;  Laterality: N/A;  Decompressive Laminectomy L2 to the Sacrum (X-Ray)  . OPEN REDUCTION INTERNAL FIXATION (ORIF) DISTAL RADIAL FRACTURE Left 01/01/2020   Procedure: OPEN REDUCTION INTERNAL FIXATION (ORIF) DISTAL RADIAL FRACTURE;  Surgeon: Iran Planas, [name];  Location: [place];  Service: Orthopedics;  Laterality: Left;  . OTHER SURGICAL HISTORY     left wrist surgery - has plate in left wrist   . OTHER SURGICAL HISTORY     right knee surgery due to torn cartilage   . TONSILLECTOMY    . WRIST FRACTURE SURGERY Left     Family History  Problem Relation Age of Onset  . Cancer Mother        BREAST  . COPD Father   . Emphysema Father   . Cancer Daughter        breast   Social History:  reports that she has never smoked. She has never used smokeless tobacco. She reports that she does not drink alcohol and does not use drugs.  Allergies:  Allergies  Allergen Reactions  . Codeine Other (See Comments)    Nightmares, imagining things  . Keflex [Cephalexin] Nausea Only    Pt ended up in ER w/ CP    (Not in a hospital admission)   Results for orders  placed or performed during the hospital encounter of 05/11/20 (from the past 48 hour(s))  Basic metabolic panel     Status: Abnormal   Collection Time: 05/11/20  8:56 AM  Result Value Ref Range   Sodium 140 135 - 145 mmol/L   Potassium 3.7 3.5 - 5.1 mmol/L   Chloride [name] 98 - 111 mmol/L   CO2 26 22 - 32 mmol/L   Glucose, Bld 134 (H) 70 - 99 mg/dL    Comment: Glucose reference range applies only to samples taken after fasting for at least 8 hours.   BUN 14 8 - 23 mg/dL   Creatinine, Ser 0.93 0.44 - 1.00 mg/dL   Calcium 8.7 (L) 8.9 - 10.3 mg/dL   GFR calc non Af Amer 52 (L) >60 mL/min   GFR calc Af Amer >60 >60 mL/min   Anion gap 10 5 - 15    Comment:  Performed at [facility] [address] [place], [place] [address]  CBC     Status: Abnormal   Collection Time: 05/11/20  8:56 AM  Result Value Ref Range   WBC 14.8 (H) 4.0 - 10.5 K/uL   RBC 4.18 3.87 - 5.11 MIL/uL   Hemoglobin 12.4 12.0 - 15.0 g/dL   HCT 40.2 36 - 46 %   MCV 96.2 80.0 - 100.0 fL   MCH 29.7 26.0 - 34.0 pg   MCHC 30.8 30.0 - 36.0 g/dL   RDW 14.1 11.5 - 15.5 %   Platelets 264 150 - 400 K/uL   nRBC 0.0 0.0 - 0.2 %    Comment: Performed at [facility], [place] [address] [place], [place] [address]  Troponin I (High Sensitivity)     Status: Abnormal   Collection Time: 05/11/20  8:56 AM  Result Value Ref Range   Troponin I (High Sensitivity) 35 (H) <18 ng/L    Comment: (NOTE) Elevated high sensitivity troponin I (hsTnI) values and significant  changes across serial measurements may suggest ACS but many other  chronic and acute conditions are known to elevate hsTnI results.  Refer to the "Links" section for chest pain algorithms and additional  guidance. Performed at [facility], [place] [address], [place], [place] [address]   Troponin I (High Sensitivity)     Status: Abnormal   Collection Time: 05/11/20 11:01 AM  Result Value Ref Range   Troponin I (High Sensitivity) 37 (H) <18 ng/L    Comment: (NOTE) Elevated high sensitivity troponin I (hsTnI) values and significant  changes across serial measurements may suggest ACS but many other  chronic and acute conditions are known to elevate hsTnI results.  Refer to the "Links" section for chest pain algorithms and additional  guidance. Performed at [facility], [place] [address] [place], [place] [address]   SARS Coronavirus 2 by RT PCR (hospital order, performed in [facility] hospital lab) Nasopharyngeal Nasopharyngeal Swab     Status: None   Collection Time: 05/11/20  3:00 PM   Specimen: Nasopharyngeal Swab  Result Value Ref Range   SARS Coronavirus 2 NEGATIVE NEGATIVE    Comment:  (NOTE) SARS-CoV-2 target nucleic acids are NOT DETECTED.  The SARS-CoV-2 RNA is generally detectable in upper and lower respiratory specimens during the acute phase of infection. The lowest concentration of SARS-CoV-2 viral copies this assay can detect is 250 copies / mL. A negative result does not preclude SARS-CoV-2 infection and should not be used as the sole basis for treatment  or other patient management decisions.  A negative result may occur with improper specimen collection / handling, submission of specimen other than nasopharyngeal swab, presence of viral mutation(s) within the areas targeted by this assay, and inadequate number of viral copies (<250 copies / mL). A negative result must be combined with clinical observations, patient history, and epidemiological information.  Fact Sheet for Patients:   StrictlyIdeas.no  Fact Sheet for Healthcare Providers: BankingDealers.co.za  This test is not yet approved or  cleared by the [place] FDA and has been authorized for detection and/or diagnosis of SARS-CoV-2 by FDA under an Emergency Use Authorization (EUA).  This EUA will remain in effect (meaning this test can be used) for the duration of the COVID-19 declaration under Section 564(b)(1) of the Act, 21 U.S.C. section 360bbb-3(b)(1), unless the authorization is terminated or revoked sooner.  Performed at [facility], [place] [address] [place], [place] [address]    DG Chest 2 View  Result Date: 05/11/2020 CLINICAL DATA:  Chest pain. EXAM: CHEST - 2 VIEW COMPARISON:  January 11, 2020. FINDINGS: Stable cardiomegaly. No pneumothorax or pleural effusion is noted. Old left rib fractures are noted. Lungs are clear. IMPRESSION: No active cardiopulmonary disease. Aortic Atherosclerosis (ICD10-I70.0). Electronically Signed   By: Marijo Conception M.D.   On: 05/11/2020 09:20   CT Angio Chest PE W and/or Wo Contrast  Result Date:  05/11/2020 CLINICAL DATA:  Shortness of breath EXAM: CT ANGIOGRAPHY CHEST WITH CONTRAST TECHNIQUE: Multidetector CT imaging of the chest was performed using the standard protocol during bolus administration of intravenous contrast. Multiplanar CT image reconstructions and MIPs were obtained to evaluate the vascular anatomy. CONTRAST:  41mL OMNIPAQUE IOHEXOL 350 MG/ML SOLN COMPARISON:  None. FINDINGS: Cardiovascular: Satisfactory opacification of the pulmonary arteries to the segmental level. No evidence of pulmonary embolism. Cardiomegaly. No pericardial effusion. Mediastinum/Nodes: No enlarged lymph nodes identified. Lungs/Pleura: Small right pleural effusion. No pneumothorax. Patchy ground-glass density, greatest the right lower lobe. Mild bibasilar atelectasis. Upper Abdomen: No acute abnormality Musculoskeletal: Chronic compression deformity T10 and T11. Chronic left humeral deformity. Review of the MIP images confirms the above findings. IMPRESSION: No evidence of acute pulmonary embolism. Patchy ground-glass density, greatest in the right lobe. This may reflect atelectasis, noting imaging in expiration and mucous plugging. Superimposed mild edema or atypical pneumonia not excluded. Cardiomegaly. Electronically Signed   By: Macy Mis M.D.   On: 05/11/2020 14:08    Review Of Systems Constitutional: No fever, chills, weight loss or gain. Eyes: No vision change, wears glasses. No discharge or pain. Ears: No hearing loss, No tinnitus. Respiratory: No asthma, COPD, pneumonias. Positive shortness of breath. No hemoptysis. Cardiovascular: Positive chest pain, palpitation,no  leg edema. Gastrointestinal: No nausea, vomiting, diarrhea, constipation. No GI bleed. No hepatitis. Genitourinary: No dysuria, hematuria, kidney stone. No incontinance. Neurological: No headache, stroke, seizures.  Psychiatry: No psych facility admission for anxiety, depression, suicide. No detox. Skin: No rash. Musculoskeletal:  Positive joint pain, fibromyalgia. No neck pain, back pain. Lymphadenopathy: No lymphadenopathy. Hematology: H/O anemia or easy bruising.   Blood pressure 134/66, pulse 75, temperature 98.4 F (36.9 C), temperature source Rectal, resp. rate 19, height 5\' 6"  (1.676 m), weight 70.3 kg, SpO2 100 %. Body mass index is 25.02 kg/m. General appearance: alert, cooperative, appears stated age and no distress Head: Normocephalic, atraumatic. Eyes: Blue eyes, pink conjunctiva, corneas clear. PERRL, EOM's intact. Neck: No adenopathy, no carotid bruit, no JVD, supple, symmetrical, trachea midline and thyroid not enlarged. Resp: Rhonchi  to auscultation bilaterally. Cardio: Regular rate and rhythm, S1, S2 normal, II/VI systolic murmur, no click, rub or gallop GI: Soft, non-tender; bowel sounds normal; no organomegaly. Extremities: No edema, cyanosis or clubbing. Skin: Warm and dry.  Neurologic: Alert and oriented X 3, normal strength. Normal coordination and gait.  Assessment/Plan Chest pain Community acquired pneumonia H/O atrial fibrillation H/O perforated diverticulitis with Hartmann's procedure HTN CKD II Hypothyroidism Polymyalgia rheumatica  Agree with IV antibiotic use. Inhaler as needed. Will check echocardiogram for LV function to r/o systolic or diastolic heart failure Check TSH, BNP. Continue Diltiazem and Nadolol for now.   Time spent: Review of old records, Lab, x-rays, EKG, other cardiac tests, examination, discussion with patient over 70 minutes.  [name] Riddle, [name]  05/11/2020, 5:53 PM

## 2020-05-11 NOTE — ED Triage Notes (Signed)
Pt here with home with c/o chest pain since 12 yesterday , cardioverted 2 days ago from afib  At the cards office , pt does endorse sob with exertion

## 2020-05-12 ENCOUNTER — Encounter (HOSPITAL_COMMUNITY): Payer: Self-pay | Admitting: Internal Medicine

## 2020-05-12 ENCOUNTER — Observation Stay (HOSPITAL_COMMUNITY): Payer: Medicare PPO

## 2020-05-12 ENCOUNTER — Telehealth: Payer: Self-pay | Admitting: Internal Medicine

## 2020-05-12 DIAGNOSIS — R072 Precordial pain: Secondary | ICD-10-CM | POA: Diagnosis not present

## 2020-05-12 DIAGNOSIS — J189 Pneumonia, unspecified organism: Secondary | ICD-10-CM | POA: Diagnosis not present

## 2020-05-12 DIAGNOSIS — I10 Essential (primary) hypertension: Secondary | ICD-10-CM | POA: Diagnosis not present

## 2020-05-12 DIAGNOSIS — R54 Age-related physical debility: Secondary | ICD-10-CM | POA: Diagnosis not present

## 2020-05-12 DIAGNOSIS — R0602 Shortness of breath: Secondary | ICD-10-CM | POA: Diagnosis not present

## 2020-05-12 DIAGNOSIS — I340 Nonrheumatic mitral (valve) insufficiency: Secondary | ICD-10-CM | POA: Diagnosis not present

## 2020-05-12 DIAGNOSIS — R079 Chest pain, unspecified: Secondary | ICD-10-CM

## 2020-05-12 DIAGNOSIS — I4819 Other persistent atrial fibrillation: Secondary | ICD-10-CM | POA: Diagnosis not present

## 2020-05-12 DIAGNOSIS — I361 Nonrheumatic tricuspid (valve) insufficiency: Secondary | ICD-10-CM | POA: Diagnosis not present

## 2020-05-12 DIAGNOSIS — I351 Nonrheumatic aortic (valve) insufficiency: Secondary | ICD-10-CM | POA: Diagnosis not present

## 2020-05-12 DIAGNOSIS — D6869 Other thrombophilia: Secondary | ICD-10-CM

## 2020-05-12 LAB — CBC WITH DIFFERENTIAL/PLATELET
Abs Immature Granulocytes: 0.06 10*3/uL (ref 0.00–0.07)
Basophils Absolute: 0 10*3/uL (ref 0.0–0.1)
Basophils Relative: 0 %
Eosinophils Absolute: 0.4 10*3/uL (ref 0.0–0.5)
Eosinophils Relative: 3 %
HCT: 37.5 % (ref 36.0–46.0)
Hemoglobin: 11.9 g/dL — ABNORMAL LOW (ref 12.0–15.0)
Immature Granulocytes: 1 %
Lymphocytes Relative: 10 %
Lymphs Abs: 1.3 10*3/uL (ref 0.7–4.0)
MCH: 30.4 pg (ref 26.0–34.0)
MCHC: 31.7 g/dL (ref 30.0–36.0)
MCV: 95.7 fL (ref 80.0–100.0)
Monocytes Absolute: 1.2 10*3/uL — ABNORMAL HIGH (ref 0.1–1.0)
Monocytes Relative: 10 %
Neutro Abs: 9.5 10*3/uL — ABNORMAL HIGH (ref 1.7–7.7)
Neutrophils Relative %: 76 %
Platelets: 245 10*3/uL (ref 150–400)
RBC: 3.92 MIL/uL (ref 3.87–5.11)
RDW: 14.1 % (ref 11.5–15.5)
WBC: 12.5 10*3/uL — ABNORMAL HIGH (ref 4.0–10.5)
nRBC: 0 % (ref 0.0–0.2)

## 2020-05-12 LAB — COMPREHENSIVE METABOLIC PANEL
ALT: 20 U/L (ref 0–44)
AST: 16 U/L (ref 15–41)
Albumin: 2.8 g/dL — ABNORMAL LOW (ref 3.5–5.0)
Alkaline Phosphatase: 85 U/L (ref 38–126)
Anion gap: 7 (ref 5–15)
BUN: 14 mg/dL (ref 8–23)
CO2: 29 mmol/L (ref 22–32)
Calcium: 8.8 mg/dL — ABNORMAL LOW (ref 8.9–10.3)
Chloride: 103 mmol/L (ref 98–111)
Creatinine, Ser: 0.88 mg/dL (ref 0.44–1.00)
GFR calc Af Amer: >60 mL/min (ref >60)
GFR calc non Af Amer: 56 mL/min — ABNORMAL LOW (ref >60)
Glucose, Bld: 113 mg/dL — ABNORMAL HIGH (ref 70–99)
Potassium: 4 mmol/L (ref 3.5–5.1)
Sodium: 139 mmol/L (ref 135–145)
Total Bilirubin: 0.8 mg/dL (ref 0.3–1.2)
Total Protein: 5.7 g/dL — ABNORMAL LOW (ref 6.5–8.1)

## 2020-05-12 LAB — TSH: TSH: 0.542 u[IU]/mL (ref 0.350–4.500)

## 2020-05-12 LAB — BRAIN NATRIURETIC PEPTIDE: B Natriuretic Peptide: 162.3 pg/mL — ABNORMAL HIGH (ref 0.0–100.0)

## 2020-05-12 LAB — ECHOCARDIOGRAM COMPLETE
Height: 66 in
Weight: 2510.4 oz

## 2020-05-12 MED ORDER — IPRATROPIUM BROMIDE 0.02 % IN SOLN: 0.5000 mg | Freq: Four times a day (QID) | RESPIRATORY_TRACT | Status: DC | PRN

## 2020-05-12 MED ORDER — FUROSEMIDE 10 MG/ML IJ SOLN
20.0000 mg | Freq: Once | INTRAMUSCULAR | Status: AC
  Administered 2020-05-12: 20 mg via INTRAVENOUS
  Filled 2020-05-12: qty 2

## 2020-05-12 NOTE — Discharge Instructions (Signed)

## 2020-05-12 NOTE — Telephone Encounter (Signed)
New Message  Patient's daughter is calling in to notify Dr. Caryl Comes that the patient is in the hospital and has been diagnosed with pneumonia and is still having chest pain/pressure and the patient's daughter would like Dr. Caryl Comes or the nurse to look over the results to advise on what to do about chest pain.

## 2020-05-12 NOTE — Consult Note (Signed)
Ref: [name], MD   Subjective:  VS stable. Shortness of breath continues.  Afebrile. Improving WBC count. Stable potassium and BUN/Cr. Echocardiogram shows mild LVH with mild AI and PI and moderate MR and TR.  Objective:  Vital Signs in the last 24 hours: Temp:  [98.3 F (36.8 C)-99.1 F (37.3 C)] 98.7 F (37.1 C) (07/02 1146) Pulse Rate:  [59-78] 66 (07/02 1146) Cardiac Rhythm: Normal sinus rhythm (07/02 0742) Resp:  [18-32] 18 (07/02 1146) BP: (121-166)/(60-85) 127/60 (07/02 1146) SpO2:  [92 %-100 %] 95 % (07/02 1146) Weight:  [71.2 kg] 71.2 kg (07/02 0344)  Physical Exam: BP Readings from Last 1 Encounters:  05/12/20 127/60     Wt Readings from Last 1 Encounters:  05/12/20 71.2 kg    Weight change:  Body mass index is 25.32 kg/m. HEENT: [place]/AT, Eyes-Blue, PERL, EOMI, Conjunctiva-Pink, Sclera-Non-icteric Neck: No JVD, No bruit, Trachea midline. Lungs:  Clearing, Bilateral. Cardiac:  Regular rhythm, normal S1 and S2, no S3. II/VI systolic murmur. Abdomen:  Soft, non-tender. BS present. S/P Hartmann's procedure with colostomy. Extremities:  Trace edema present. No cyanosis. No clubbing. CNS: AxOx3, Cranial nerves grossly intact, moves all 4 extremities.  Skin: Warm and dry.   Intake/Output from previous day: 07/01 0701 - 07/02 0700 In: 349.8 [IV Piggyback:349.8] Out: 200 [Urine:200]    Lab Results: BMET    Component Value Date/Time   NA 139 05/12/2020 1028   NA 140 05/11/2020 0856   NA 144 05/09/2020 1108   K 4.0 05/12/2020 1028   K 3.7 05/11/2020 0856   K 3.5 05/09/2020 1108   CL 103 05/12/2020 1028   CL 104 05/11/2020 0856   CL 105 05/09/2020 1108   CO2 29 05/12/2020 1028   CO2 26 05/11/2020 0856   CO2 22 02/03/2020 1010   GLUCOSE 113 (H) 05/12/2020 1028   GLUCOSE 134 (H) 05/11/2020 0856   GLUCOSE 94 05/09/2020 1108   BUN 14 05/12/2020 1028   BUN 14 05/11/2020 0856   BUN 20 05/09/2020 1108   CREATININE 0.88 05/12/2020 1028   CREATININE  0.93 05/11/2020 0856   CREATININE 1.30 (H) 05/09/2020 1108   CREATININE 0.95 (H) 01/26/2016 1552   CALCIUM 8.8 (L) 05/12/2020 1028   CALCIUM 8.7 (L) 05/11/2020 0856   CALCIUM 9.3 02/03/2020 1010   GFRNONAA 56 (L) 05/12/2020 1028   GFRNONAA 52 (L) 05/11/2020 0856   GFRNONAA 59 (L) 01/17/2020 0514   GFRAA >60 05/12/2020 1028   GFRAA >60 05/11/2020 0856   GFRAA >60 01/17/2020 0514   CBC    Component Value Date/Time   WBC 12.5 (H) 05/12/2020 1028   RBC 3.92 05/12/2020 1028   HGB 11.9 (L) 05/12/2020 1028   HCT 37.5 05/12/2020 1028   PLT 245 05/12/2020 1028   MCV 95.7 05/12/2020 1028   MCH 30.4 05/12/2020 1028   MCHC 31.7 05/12/2020 1028   RDW 14.1 05/12/2020 1028   LYMPHSABS 1.3 05/12/2020 1028   MONOABS 1.2 (H) 05/12/2020 1028   EOSABS 0.4 05/12/2020 1028   BASOSABS 0.0 05/12/2020 1028   HEPATIC Function Panel Recent Labs    01/06/20 0454 01/15/20 0541 05/12/20 1028  PROT 4.9* 5.7* 5.7*   HEMOGLOBIN A1C No components found for: HGA1C,  MPG CARDIAC ENZYMES Lab Results  Component Value Date   TROPONINI <0.03 03/04/2016   BNP No results for input(s): PROBNP in the last 8760 hours. TSH Recent Labs    06/22/19 1112 01/07/20 1036 05/12/20 0435  TSH 1.42 0.187* 0.542  CHOLESTEROL Recent Labs    06/22/19 1112  CHOL 190    Scheduled Meds: . amoxicillin-clavulanate  1 tablet Oral BID  . apixaban  5 mg Oral BID  . azithromycin  500 mg Oral Daily  . diltiazem  180 mg Oral Daily  . docusate sodium  100 mg Oral BID  . furosemide  20 mg Intravenous Once  . gabapentin  300 mg Oral QHS  . levothyroxine  50 mcg Oral Q0600  . multivitamin  1 tablet Oral Q lunch  . nadolol  80 mg Oral Daily  . pantoprazole  40 mg Oral Daily  . predniSONE  10 mg Oral Q lunch  . senna-docusate  1 tablet Oral QHS   Continuous Infusions: PRN Meds:.acetaminophen, hydrALAZINE, ipratropium, ondansetron  Assessment/Plan: Community acquired pneumonia H/O atrial fibrillation Mild AI  and PI. Moderate MR and TR. Moderate diastolic dysfunction Acute on chronic diastolic left heart failure HTN Hypothyroidism CKD, II Polymyalgia rheumatica  Use small dose IV lasix. Patient has oral furosemide at home and she used to take it on M-W-F. Continue antibiotics. Discussed echocardiogram findings with patient and son. Discussed stress test and cardiac catheterization in near future. Patient prefers medical treatment for now. If discharged home tomorrow, she can see me in 1 week. Otherwise Dr. Terrence Dupont is covering for weekend.   LOS: 0 days   Time spent including chart review, lab review, examination, discussion with patient and family : 87 min   Dixie Dials  MD  05/12/2020, 3:54 PM

## 2020-05-12 NOTE — ED Notes (Signed)
RN called pt daughter, Jana Half, to notify her that pt is going upstairs per family request.

## 2020-05-12 NOTE — Progress Notes (Signed)
PROGRESS NOTE    [name]  ONG:[id number] DOB: 01/15/1923 DOA: 05/11/2020 PCP: [name], MD    Brief Narrative:  86 y.o. female with medical history significant of paroxysmal A. fib status post cardioversion 2 days ago, DVT on Eliquis, hypertension, hypothyroidism, PVD, presented with new onset chest pain and short of breath.  Symptoms started yesterday, about 1 day after cardioversion.  Chest pain is centrally located highest, pressure-like, constant, worsening with exertion.  Also associated with new onset short of breath worsening with exertion, no cough no wheezing no fever chills. ED Course: Blood pressure was found significant elevated by EMS, CT angiogram negative for PE but with upper lobe interstitial infiltrates suspecting pneumonia.  Blood work, WBC 14.8, troponin 35> 37  Assessment & Plan:   Principal Problem:   Chest pain Active Problems:   HTN (hypertension)   Persistent atrial fibrillation (HCC)   Secondary hypercoagulable state ([place])   Community acquired pneumonia   Advanced age  Chest pain rule out ACS -Chest pain initially noted to be angina like, started 1 day after cardioversion. -Cardiology following, appreciate input -Treat uncontrolled hypertension -PE was ruled out, CTA chest was reviewed -Discussed with Cardiology today, recommendation for trial of IV lasix -Repeat bmet in AM  Pneumonia -Probably aspiration pneumonia related to TEE, change antibiotics to Augmentin, continue Zithromax for atypical coverage. -Stable at this time  Dyspnea -With new onset wheezing, no Hx of COPD, consider the wheezing may either be PNA caused bronchitis and/or diastolic CHF decompensation from uncontrolled hypertension. -Per Cardiology, recommendation for trial of IV lasix -Repeat bmet in AM  Uncontrolled hypertension/acute on chronic diastolic CHF -Resume home BP meds regimen, add as needed hydralazine -Per Cardiology, concern for residual volume  overload. Trial of IV lasix noted  Hx of DVT -Continue Eliquis -Stable at this time  Persistent afib -Currently rate controlled -Continue chronic anticoagulation per above  Chronic anticoagulation  DVT prophylaxis: eliquis Code Status: Full Family Communication: Pt in room, family at bedside  Status is: Observation  The patient remains OBS appropriate and will d/c before 2 midnights.  Dispo: The patient is from: Home              Anticipated d/c is to: Home              Anticipated d/c date is: 1 day              Patient currently is not medically stable to d/c.       Consultants:   Cardiology  Procedures:     Antimicrobials: Anti-infectives (From admission, onward)   Start     Dose/Rate Route Frequency Ordered Stop   05/12/20 1500  azithromycin (ZITHROMAX) tablet 500 mg     Discontinue     500 mg Oral Daily 05/11/20 1553     05/11/20 2200  amoxicillin-clavulanate (AUGMENTIN) 500-125 MG per tablet 500 mg     Discontinue     1 tablet Oral 2 times daily 05/11/20 1552     05/11/20 1430  cefTRIAXone (ROCEPHIN) 1 g in sodium chloride 0.9 % 100 mL IVPB        1 g 200 mL/hr over 30 Minutes Intravenous  Once 05/11/20 1425 05/11/20 1542   05/11/20 1430  azithromycin (ZITHROMAX) 500 mg in sodium chloride 0.9 % 250 mL IVPB        500 mg 250 mL/hr over 60 Minutes Intravenous  Once 05/11/20 1425 05/11/20 1642      Subjective: Eager to  go home soon  Objective: Vitals:   05/12/20 0344 05/12/20 0742 05/12/20 0751 05/12/20 1146  BP: (!) 164/85 (!) 166/73  127/60  Pulse: 72 73  66  Resp: 19 18  18   Temp: 98.3 F (36.8 C) 99.1 F (37.3 C)  98.7 F (37.1 C)  TempSrc: Oral Oral  Oral  SpO2: 99% 95% 92% 95%  Weight: 71.2 kg     Height:        Intake/Output Summary (Last 24 hours) at 05/12/2020 1611 Last data filed at 05/12/2020 1300 Gross per 24 hour  Intake 729.83 ml  Output 650 ml  Net 79.83 ml   Filed Weights   05/11/20 1121 05/12/20 0110 05/12/20 0344    Weight: 70.3 kg 71.2 kg 71.2 kg    Examination: General exam: Appears calm and comfortable  Respiratory system: Clear to auscultation. Respiratory effort normal. Cardiovascular system: S1 & S2 heard, Regular Gastrointestinal system: Abdomen is nondistended, soft and nontender. No organomegaly or masses felt. Normal bowel sounds heard. Central nervous system: Alert and oriented. No focal neurological deficits. Extremities: Symmetric 5 x 5 power. Skin: No rashes, lesions Psychiatry: Judgement and insight appear normal. Mood & affect appropriate.   Data Reviewed: I have personally reviewed following labs and imaging studies  CBC: Recent Labs  Lab 05/09/20 1108 05/11/20 0856 05/12/20 1028  WBC  --  14.8* 12.5*  NEUTROABS  --   --  9.5*  HGB 13.6 12.4 11.9*  HCT 40.0 40.2 37.5  MCV  --  96.2 95.7  PLT  --  264 096   Basic Metabolic Panel: Recent Labs  Lab 05/09/20 1108 05/11/20 0856 05/12/20 1028  NA 144 140 139  K 3.5 3.7 4.0  CL 105 104 103  CO2  --  26 29  GLUCOSE 94 134* 113*  BUN 20 14 14   CREATININE 1.30* 0.93 0.88  CALCIUM  --  8.7* 8.8*   GFR: Estimated Creatinine Clearance: 38.7 mL/min (by C-G formula based on SCr of 0.88 mg/dL). Liver Function Tests: Recent Labs  Lab 05/12/20 1028  AST 16  ALT 20  ALKPHOS 85  BILITOT 0.8  PROT 5.7*  ALBUMIN 2.8*   No results for input(s): LIPASE, AMYLASE in the last 168 hours. No results for input(s): AMMONIA in the last 168 hours. Coagulation Profile: No results for input(s): INR, PROTIME in the last 168 hours. Cardiac Enzymes: No results for input(s): CKTOTAL, CKMB, CKMBINDEX, TROPONINI in the last 168 hours. BNP (last 3 results) No results for input(s): PROBNP in the last 8760 hours. HbA1C: No results for input(s): HGBA1C in the last 72 hours. CBG: Recent Labs  Lab 05/11/20 2155  GLUCAP 97   Lipid Profile: No results for input(s): CHOL, HDL, LDLCALC, TRIG, CHOLHDL, LDLDIRECT in the last 72  hours. Thyroid Function Tests: Recent Labs    05/12/20 0435  TSH 0.542   Anemia Panel: No results for input(s): VITAMINB12, FOLATE, FERRITIN, TIBC, IRON, RETICCTPCT in the last 72 hours. Sepsis Labs: No results for input(s): PROCALCITON, LATICACIDVEN in the last 168 hours.  Recent Results (from the past 240 hour(s))  SARS CORONAVIRUS 2 (TAT 6-24 HRS) Nasopharyngeal Nasopharyngeal Swab     Status: None   Collection Time: 05/05/20  1:21 PM   Specimen: Nasopharyngeal Swab  Result Value Ref Range Status   SARS Coronavirus 2 NEGATIVE NEGATIVE Final    Comment: (NOTE) SARS-CoV-2 target nucleic acids are NOT DETECTED.  The SARS-CoV-2 RNA is generally detectable in upper and lower respiratory specimens during  the acute phase of infection. Negative results do not preclude SARS-CoV-2 infection, do not rule out co-infections with other pathogens, and should not be used as the sole basis for treatment or other patient management decisions. Negative results must be combined with clinical observations, patient history, and epidemiological information. The expected result is Negative.  Fact Sheet for Patients: SugarRoll.be  Fact Sheet for Healthcare Providers: https://www.woods-mathews.com/  This test is not yet approved or cleared by the [place] FDA and  has been authorized for detection and/or diagnosis of SARS-CoV-2 by FDA under an Emergency Use Authorization (EUA). This EUA will remain  in effect (meaning this test can be used) for the duration of the COVID-19 declaration under Se ction 564(b)(1) of the Act, 21 U.S.C. section 360bbb-3(b)(1), unless the authorization is terminated or revoked sooner.  Performed at [facility], [place] [address] [place], [place] [address]   SARS Coronavirus 2 by RT PCR (hospital order, performed in [facility] hospital lab) Nasopharyngeal Nasopharyngeal Swab     Status: None   Collection Time:  05/11/20  3:00 PM   Specimen: Nasopharyngeal Swab  Result Value Ref Range Status   SARS Coronavirus 2 NEGATIVE NEGATIVE Final    Comment: (NOTE) SARS-CoV-2 target nucleic acids are NOT DETECTED.  The SARS-CoV-2 RNA is generally detectable in upper and lower respiratory specimens during the acute phase of infection. The lowest concentration of SARS-CoV-2 viral copies this assay can detect is 250 copies / mL. A negative result does not preclude SARS-CoV-2 infection and should not be used as the sole basis for treatment or other patient management decisions.  A negative result may occur with improper specimen collection / handling, submission of specimen other than nasopharyngeal swab, presence of viral mutation(s) within the areas targeted by this assay, and inadequate number of viral copies (<250 copies / mL). A negative result must be combined with clinical observations, patient history, and epidemiological information.  Fact Sheet for Patients:   StrictlyIdeas.no  Fact Sheet for Healthcare Providers: BankingDealers.co.za  This test is not yet approved or  cleared by the [place] FDA and has been authorized for detection and/or diagnosis of SARS-CoV-2 by FDA under an Emergency Use Authorization (EUA).  This EUA will remain in effect (meaning this test can be used) for the duration of the COVID-19 declaration under Section 564(b)(1) of the Act, 21 U.S.C. section 360bbb-3(b)(1), unless the authorization is terminated or revoked sooner.  Performed at [facility], [place] [address] [place], [place] [address]      Radiology Studies: DG Chest 2 View  Result Date: 05/11/2020 CLINICAL DATA:  Chest pain. EXAM: CHEST - 2 VIEW COMPARISON:  January 11, 2020. FINDINGS: Stable cardiomegaly. No pneumothorax or pleural effusion is noted. Old left rib fractures are noted. Lungs are clear. IMPRESSION: No active cardiopulmonary disease. Aortic  Atherosclerosis (ICD10-I70.0). Electronically Signed   By: Marijo Conception M.D.   On: 05/11/2020 09:20   CT Angio Chest PE W and/or Wo Contrast  Result Date: 05/11/2020 CLINICAL DATA:  Shortness of breath EXAM: CT ANGIOGRAPHY CHEST WITH CONTRAST TECHNIQUE: Multidetector CT imaging of the chest was performed using the standard protocol during bolus administration of intravenous contrast. Multiplanar CT image reconstructions and MIPs were obtained to evaluate the vascular anatomy. CONTRAST:  58mL OMNIPAQUE IOHEXOL 350 MG/ML SOLN COMPARISON:  None. FINDINGS: Cardiovascular: Satisfactory opacification of the pulmonary arteries to the segmental level. No evidence of pulmonary embolism. Cardiomegaly. No pericardial effusion. Mediastinum/Nodes: No enlarged lymph nodes identified.  Lungs/Pleura: Small right pleural effusion. No pneumothorax. Patchy ground-glass density, greatest the right lower lobe. Mild bibasilar atelectasis. Upper Abdomen: No acute abnormality Musculoskeletal: Chronic compression deformity T10 and T11. Chronic left humeral deformity. Review of the MIP images confirms the above findings. IMPRESSION: No evidence of acute pulmonary embolism. Patchy ground-glass density, greatest in the right lobe. This may reflect atelectasis, noting imaging in expiration and mucous plugging. Superimposed mild edema or atypical pneumonia not excluded. Cardiomegaly. Electronically Signed   By: Macy Mis M.D.   On: 05/11/2020 14:08   ECHOCARDIOGRAM COMPLETE  Result Date: 05/12/2020    ECHOCARDIOGRAM REPORT   Patient Name:   NAOMEE NOWLAND Date of Exam: 05/12/2020 Medical Rec #:  [id number]       Height:       66.0 in Accession #:    [id number]      Weight:       156.9 lb Date of Birth:  [birth year]        BSA:          1.804 m Patient Age:    89 years        BP:           166/73 mmHg Patient Gender: F               HR:           72 bpm. Exam Location:  Inpatient Procedure: 2D Echo Indications:     Chest Pain R07.9   History:         Patient has prior history of Echocardiogram examinations, most                  recent 12/30/2019. Risk Factors:Hypertension.  Sonographer:     Mikki Santee RDCS (AE) Referring Phys:  [place] Diagnosing Phys: Dixie Dials MD IMPRESSIONS  1. Left ventricular ejection fraction, by estimation, is 55 to 60%. The left ventricle has normal function. The left ventricle has no regional wall motion abnormalities. There is mild concentric left ventricular hypertrophy. Left ventricular diastolic parameters are consistent with Grade II diastolic dysfunction (pseudonormalization).  2. Right ventricular systolic function is low normal. The right ventricular size is normal. There is mildly elevated pulmonary artery systolic pressure.  3. Left atrial size was severely dilated.  4. Right atrial size was mildly dilated.  5. The mitral valve is degenerative. Mild mitral valve regurgitation.  6. The aortic valve is tricuspid. Aortic valve regurgitation is mild.  7. Aortic dilatation noted. There is mild dilatation of the ascending aorta. There is Moderate (Grade III) atheroma plaque.  8. The inferior vena cava is dilated in size with <50% respiratory variability, suggesting right atrial pressure of 15 mmHg. FINDINGS  Left Ventricle: Left ventricular ejection fraction, by estimation, is 55 to 60%. The left ventricle has normal function. The left ventricle has no regional wall motion abnormalities. The left ventricular internal cavity size was normal in size. There is  mild concentric left ventricular hypertrophy. Left ventricular diastolic parameters are consistent with Grade II diastolic dysfunction (pseudonormalization). Right Ventricle: Prominent moderator band. The right ventricular size is normal. No increase in right ventricular wall thickness. Right ventricular systolic function is low normal. There is mildly elevated pulmonary artery systolic pressure. The tricuspid regurgitant velocity is 2.66  m/s, and with an assumed right atrial pressure of 8 mmHg, the estimated right ventricular systolic pressure is 38.9 mmHg. Left Atrium: Left atrial size was severely dilated. Right Atrium: Right atrial size was mildly dilated.  Pericardium: There is no evidence of pericardial effusion. Mitral Valve: The mitral valve is degenerative in appearance. There is mild calcification of the mitral valve leaflet(s). Moderate mitral annular calcification. Mild mitral valve regurgitation. MV peak gradient, 9.4 mmHg. The mean mitral valve gradient is 4.0 mmHg. Tricuspid Valve: The tricuspid valve is normal in structure. Tricuspid valve regurgitation is mild. Aortic Valve: The aortic valve is tricuspid. . There is mild thickening and mild calcification of the aortic valve. Aortic valve regurgitation is mild. Aortic regurgitation PHT measures 433 msec. Mild to moderate aortic valve annular calcification. There  is mild thickening of the aortic valve. There is mild calcification of the aortic valve. Pulmonic Valve: The pulmonic valve was normal in structure. Pulmonic valve regurgitation is mild. Aorta: Aortic dilatation noted. There is mild dilatation of the ascending aorta. There is moderate (Grade III) atheroma plaque. Venous: The inferior vena cava is dilated in size with less than 50% respiratory variability, suggesting right atrial pressure of 15 mmHg. IAS/Shunts: The interatrial septum was not assessed.  LEFT VENTRICLE PLAX 2D LVIDd:         4.10 cm  Diastology LVIDs:         3.20 cm  LV e' lateral:   4.47 cm/s LV PW:         1.30 cm  LV E/e' lateral: 28.4 LV IVS:        1.30 cm  LV e' medial:    5.87 cm/s LVOT diam:     2.30 cm  LV E/e' medial:  21.6 LV SV:         97 LV SV Index:   54 LVOT Area:     4.15 cm  RIGHT VENTRICLE RV S prime:     11.60 cm/s TAPSE (M-mode): 1.7 cm LEFT ATRIUM              Index       RIGHT ATRIUM           Index LA diam:        3.70 cm  2.05 cm/m  RA Area:     17.40 cm LA Vol (A2C):   102.0 ml  56.55 ml/m RA Volume:   40.00 ml  22.18 ml/m LA Vol (A4C):   82.3 ml  45.63 ml/m LA Biplane Vol: 91.8 ml  50.89 ml/m  AORTIC VALVE LVOT Vmax:   100.00 cm/s LVOT Vmean:  73.800 cm/s LVOT VTI:    0.233 m AI PHT:      433 msec  AORTA Ao Root diam: 3.30 cm MITRAL VALVE                TRICUSPID VALVE MV Area (PHT): 2.76 cm     TR Peak grad:   28.3 mmHg MV Peak grad:  9.4 mmHg     TR Vmax:        266.00 cm/s MV Mean grad:  4.0 mmHg MV Vmax:       1.53 m/s     SHUNTS MV Vmean:      88.7 cm/s    Systemic VTI:  0.23 m MV Decel Time: 275 msec     Systemic Diam: 2.30 cm MV E velocity: 127.00 cm/s MV A velocity: 78.20 cm/s MV E/A ratio:  1.62 Dixie Dials MD Electronically signed by Dixie Dials MD Signature Date/Time: 05/12/2020/10:11:08 AM    Final     Scheduled Meds: . amoxicillin-clavulanate  1 tablet Oral BID  . apixaban  5 mg Oral BID  .  azithromycin  500 mg Oral Daily  . diltiazem  180 mg Oral Daily  . docusate sodium  100 mg Oral BID  . furosemide  20 mg Intravenous Once  . gabapentin  300 mg Oral QHS  . levothyroxine  50 mcg Oral Q0600  . multivitamin  1 tablet Oral Q lunch  . nadolol  80 mg Oral Daily  . pantoprazole  40 mg Oral Daily  . predniSONE  10 mg Oral Q lunch  . senna-docusate  1 tablet Oral QHS   Continuous Infusions:   LOS: 0 days   Marylu Lund, MD [facility] Pager On Amion  If 7PM-7AM, please contact night-coverage 05/12/2020, 4:11 PM

## 2020-05-12 NOTE — Progress Notes (Signed)
  Echocardiogram 2D Echocardiogram has been performed.  Jennette Dubin 05/12/2020, 9:55 AM

## 2020-05-12 NOTE — Progress Notes (Signed)
Patient arrived to unit from ED via stretcher. Patient denies pain or needs at this time. Patient educated to unit schedule and safety precautions. Bed alarm initiated and call light placed within reach.

## 2020-05-12 NOTE — Plan of Care (Signed)

## 2020-05-13 DIAGNOSIS — R0602 Shortness of breath: Secondary | ICD-10-CM

## 2020-05-13 DIAGNOSIS — I10 Essential (primary) hypertension: Secondary | ICD-10-CM

## 2020-05-13 DIAGNOSIS — I4819 Other persistent atrial fibrillation: Secondary | ICD-10-CM | POA: Diagnosis not present

## 2020-05-13 DIAGNOSIS — R54 Age-related physical debility: Secondary | ICD-10-CM

## 2020-05-13 DIAGNOSIS — D6869 Other thrombophilia: Secondary | ICD-10-CM | POA: Diagnosis not present

## 2020-05-13 DIAGNOSIS — J189 Pneumonia, unspecified organism: Secondary | ICD-10-CM | POA: Diagnosis not present

## 2020-05-13 DIAGNOSIS — R079 Chest pain, unspecified: Secondary | ICD-10-CM | POA: Diagnosis not present

## 2020-05-13 MED ORDER — AMOXICILLIN-POT CLAVULANATE 500-125 MG PO TABS: 1.0000 | ORAL_TABLET | Freq: Two times a day (BID) | ORAL | 0 refills | Status: AC

## 2020-05-13 MED ORDER — AZITHROMYCIN 500 MG PO TABS: 500.0000 mg | ORAL_TABLET | Freq: Every day | ORAL | 0 refills | Status: AC

## 2020-05-13 NOTE — Discharge Summary (Signed)
Physician Discharge Summary  [name] JJH:[id number] DOB: [birth year] DOA: 05/11/2020  PCP: [name], MD  Admit date: 05/11/2020 Discharge date: 05/13/2020  Admitted From: Home Disposition:  Home  Recommendations for Outpatient Follow-up:  1. Follow up with PCP in 1-2 weeks 2. Follow up with Cardiology as scheduled  Discharge Condition:Improved CODE STATUS:Full Diet recommendation: Heart healthy   Brief/Interim Summary: 86 y.o.femalewith medical history significant ofparoxysmal A. fib status post cardioversion 2 days ago,DVT on Eliquis,hypertension, hypothyroidism, PVD, presented with new onset chest pain and short of breath. Symptoms started yesterday, about1 day after cardioversion. Chest pain is centrally located highest, pressure-like, constant, worsening with exertion. Also associated with new onset short of breath worsening with exertion, no cough no wheezing no fever chills. ED Course:Blood pressure was found significant elevated by EMS, CT angiogram negative for PE but with upper lobe interstitial infiltrates suspecting pneumonia. Blood work, WBC 14.8, troponin 35>37  Discharge Diagnoses:  Principal Problem:   Chest pain Active Problems:   HTN (hypertension)   Persistent atrial fibrillation (HCC)   Secondary hypercoagulable state ([place])   Community acquired pneumonia   Advanced age   Chest pain rule out ACS -Chest pain initially noted to be angina like, started 1 day after cardioversion. -Cardiology following, appreciate input -Treatuncontrolled hypertension -PE was ruled out, CTA chest was reviewed -Remained stable  Pneumonia -Probablyaspiration pneumonia related to TEE,change antibiotics to Augmentin,continue Zithromax for atypical coverage. -To complete augmentin and azithromycin x 4 more days on d/c   Dyspnea -With new onset wheezing, no Hx of COPD, consider the wheezing may either be PNA caused bronchitis and/or diastolic  CHFdecompensation from uncontrolled hypertension. -Per Cardiology, recommendation given trial of IV lasix with clinical improvement  Uncontrolled hypertension/acute on chronic diastolic CHF -Resume home BP meds regimen, addas needed hydralazine -Per Cardiology, given trial of lasix with clinical improvement  Hx of DVT -Continued Eliquis -Stable at this time  Persistent afib -Currently rate controlled -Continue chronic anticoagulation per above  Discharge Instructions   Allergies as of 05/13/2020      Reactions   Codeine Other (See Comments)   Nightmares, imagining things   Keflex [cephalexin] Nausea Only   Pt ended up in ER w/ CP      Medication List    STOP taking these medications   furosemide 20 MG tablet Commonly known as: LASIX     TAKE these medications   acetaminophen 325 MG tablet Commonly known as: TYLENOL Take 1-2 tablets (325-650 mg total) by mouth every 4 (four) hours as needed for mild pain.   amoxicillin-clavulanate 500-125 MG tablet Commonly known as: AUGMENTIN Take 1 tablet (500 mg total) by mouth 2 (two) times daily for 4 days.   azithromycin 500 MG tablet Commonly known as: ZITHROMAX Take 1 tablet (500 mg total) by mouth daily for 4 days.   diltiazem 180 MG 24 hr capsule Commonly known as: DILACOR XR Take 1 capsule (180 mg total) by mouth daily.   docusate sodium 100 MG capsule Commonly known as: COLACE Take 1 capsule (100 mg total) by mouth 2 (two) times daily.   Eliquis 5 MG Tabs tablet Generic drug: apixaban TAKE 1 TABLET BY MOUTH TWICE A DAY What changed: how much to take   gabapentin 300 MG capsule Commonly known as: NEURONTIN Take 300 mg by mouth at bedtime.   levothyroxine 50 MCG tablet Commonly known as: SYNTHROID TAKE 1 TABLET BY MOUTH EVERY DAY What changed: when to take this   nadolol 80 MG tablet  Commonly known as: Corgard Take 1 tablet (80 mg total) by mouth daily.   ondansetron 4 MG tablet Commonly known as:  Zofran Take 1 tablet (4 mg total) by mouth every 8 (eight) hours as needed for nausea or vomiting.   pantoprazole 40 MG tablet Commonly known as: PROTONIX Take 1 tablet (40 mg total) by mouth daily.   predniSONE 5 MG tablet Commonly known as: DELTASONE Take 10 mg by mouth daily with lunch.   PreserVision AREDS 2 Caps Take 1 capsule by mouth daily with lunch.   senna-docusate 8.6-50 MG tablet Commonly known as: Senokot-S Take 1 tablet by mouth at bedtime.       Follow-up Information    Dixie Dials, MD. Schedule an appointment as soon as possible for a visit in 1 week(s).   Specialty: Cardiology Contact information: [place] [address] [phone]        [name], MD. Schedule an appointment as soon as possible for a visit in 2 week(s).   Specialty: Family Medicine Contact information: [address] [place] [address] [place] [address] [phone]        Deboraha Sprang, MD Follow up.   Specialty: Cardiology Why: as scheduled Contact information: [address] [place] [address] [phone]              Allergies  Allergen Reactions  . Codeine Other (See Comments)    Nightmares, imagining things  . Keflex [Cephalexin] Nausea Only    Pt ended up in ER w/ CP    Consultations:  Cardiology  Procedures/Studies: DG Chest 2 View  Result Date: 05/11/2020 CLINICAL DATA:  Chest pain. EXAM: CHEST - 2 VIEW COMPARISON:  January 11, 2020. FINDINGS: Stable cardiomegaly. No pneumothorax or pleural effusion is noted. Old left rib fractures are noted. Lungs are clear. IMPRESSION: No active cardiopulmonary disease. Aortic Atherosclerosis (ICD10-I70.0). Electronically Signed   By: Marijo Conception M.D.   On: 05/11/2020 09:20   CT Angio Chest PE W and/or Wo Contrast  Result Date: 05/11/2020 CLINICAL DATA:  Shortness of breath EXAM: CT ANGIOGRAPHY CHEST WITH CONTRAST TECHNIQUE: Multidetector CT imaging of the  chest was performed using the standard protocol during bolus administration of intravenous contrast. Multiplanar CT image reconstructions and MIPs were obtained to evaluate the vascular anatomy. CONTRAST:  50mL OMNIPAQUE IOHEXOL 350 MG/ML SOLN COMPARISON:  None. FINDINGS: Cardiovascular: Satisfactory opacification of the pulmonary arteries to the segmental level. No evidence of pulmonary embolism. Cardiomegaly. No pericardial effusion. Mediastinum/Nodes: No enlarged lymph nodes identified. Lungs/Pleura: Small right pleural effusion. No pneumothorax. Patchy ground-glass density, greatest the right lower lobe. Mild bibasilar atelectasis. Upper Abdomen: No acute abnormality Musculoskeletal: Chronic compression deformity T10 and T11. Chronic left humeral deformity. Review of the MIP images confirms the above findings. IMPRESSION: No evidence of acute pulmonary embolism. Patchy ground-glass density, greatest in the right lobe. This may reflect atelectasis, noting imaging in expiration and mucous plugging. Superimposed mild edema or atypical pneumonia not excluded. Cardiomegaly. Electronically Signed   By: Macy Mis M.D.   On: 05/11/2020 14:08   ECHOCARDIOGRAM COMPLETE  Result Date: 05/12/2020    ECHOCARDIOGRAM REPORT   Patient Name:   [name] Date of Exam: 05/12/2020 Medical Rec #:  [id number]       Height:       66.0 in Accession #:    [id number]      Weight:       156.9 lb Date of  Birth:  08-May-1923        BSA:          1.804 m Patient Age:    86 years        BP:           166/73 mmHg Patient Gender: F               HR:           72 bpm. Exam Location:  Inpatient Procedure: 2D Echo Indications:     Chest Pain R07.9  History:         Patient has prior history of Echocardiogram examinations, most                  recent 12/30/2019. Risk Factors:Hypertension.  Sonographer:     Mikki Santee RDCS (AE) Referring Phys:  [place] Diagnosing Phys: Dixie Dials MD IMPRESSIONS  1. Left ventricular  ejection fraction, by estimation, is 55 to 60%. The left ventricle has normal function. The left ventricle has no regional wall motion abnormalities. There is mild concentric left ventricular hypertrophy. Left ventricular diastolic parameters are consistent with Grade II diastolic dysfunction (pseudonormalization).  2. Right ventricular systolic function is low normal. The right ventricular size is normal. There is mildly elevated pulmonary artery systolic pressure.  3. Left atrial size was severely dilated.  4. Right atrial size was mildly dilated.  5. The mitral valve is degenerative. Mild mitral valve regurgitation.  6. The aortic valve is tricuspid. Aortic valve regurgitation is mild.  7. Aortic dilatation noted. There is mild dilatation of the ascending aorta. There is Moderate (Grade III) atheroma plaque.  8. The inferior vena cava is dilated in size with <50% respiratory variability, suggesting right atrial pressure of 15 mmHg. FINDINGS  Left Ventricle: Left ventricular ejection fraction, by estimation, is 55 to 60%. The left ventricle has normal function. The left ventricle has no regional wall motion abnormalities. The left ventricular internal cavity size was normal in size. There is  mild concentric left ventricular hypertrophy. Left ventricular diastolic parameters are consistent with Grade II diastolic dysfunction (pseudonormalization). Right Ventricle: Prominent moderator band. The right ventricular size is normal. No increase in right ventricular wall thickness. Right ventricular systolic function is low normal. There is mildly elevated pulmonary artery systolic pressure. The tricuspid regurgitant velocity is 2.66 m/s, and with an assumed right atrial pressure of 8 mmHg, the estimated right ventricular systolic pressure is 60.6 mmHg. Left Atrium: Left atrial size was severely dilated. Right Atrium: Right atrial size was mildly dilated. Pericardium: There is no evidence of pericardial effusion. Mitral  Valve: The mitral valve is degenerative in appearance. There is mild calcification of the mitral valve leaflet(s). Moderate mitral annular calcification. Mild mitral valve regurgitation. MV peak gradient, 9.4 mmHg. The mean mitral valve gradient is 4.0 mmHg. Tricuspid Valve: The tricuspid valve is normal in structure. Tricuspid valve regurgitation is mild. Aortic Valve: The aortic valve is tricuspid. . There is mild thickening and mild calcification of the aortic valve. Aortic valve regurgitation is mild. Aortic regurgitation PHT measures 433 msec. Mild to moderate aortic valve annular calcification. There  is mild thickening of the aortic valve. There is mild calcification of the aortic valve. Pulmonic Valve: The pulmonic valve was normal in structure. Pulmonic valve regurgitation is mild. Aorta: Aortic dilatation noted. There is mild dilatation of the ascending aorta. There is moderate (Grade III) atheroma plaque. Venous: The inferior vena cava is dilated in size with less than  50% respiratory variability, suggesting right atrial pressure of 15 mmHg. IAS/Shunts: The interatrial septum was not assessed.  LEFT VENTRICLE PLAX 2D LVIDd:         4.10 cm  Diastology LVIDs:         3.20 cm  LV e' lateral:   4.47 cm/s LV PW:         1.30 cm  LV E/e' lateral: 28.4 LV IVS:        1.30 cm  LV e' medial:    5.87 cm/s LVOT diam:     2.30 cm  LV E/e' medial:  21.6 LV SV:         97 LV SV Index:   54 LVOT Area:     4.15 cm  RIGHT VENTRICLE RV S prime:     11.60 cm/s TAPSE (M-mode): 1.7 cm LEFT ATRIUM              Index       RIGHT ATRIUM           Index LA diam:        3.70 cm  2.05 cm/m  RA Area:     17.40 cm LA Vol (A2C):   102.0 ml 56.55 ml/m RA Volume:   40.00 ml  22.18 ml/m LA Vol (A4C):   82.3 ml  45.63 ml/m LA Biplane Vol: 91.8 ml  50.89 ml/m  AORTIC VALVE LVOT Vmax:   100.00 cm/s LVOT Vmean:  73.800 cm/s LVOT VTI:    0.233 m AI PHT:      433 msec  AORTA Ao Root diam: 3.30 cm MITRAL VALVE                TRICUSPID  VALVE MV Area (PHT): 2.76 cm     TR Peak grad:   28.3 mmHg MV Peak grad:  9.4 mmHg     TR Vmax:        266.00 cm/s MV Mean grad:  4.0 mmHg MV Vmax:       1.53 m/s     SHUNTS MV Vmean:      88.7 cm/s    Systemic VTI:  0.23 m MV Decel Time: 275 msec     Systemic Diam: 2.30 cm MV E velocity: 127.00 cm/s MV A velocity: 78.20 cm/s MV E/A ratio:  1.62 Dixie Dials MD Electronically signed by Dixie Dials MD Signature Date/Time: 05/12/2020/10:11:08 AM    Final      Subjective: Eager to go home  Discharge Exam: Vitals:   05/13/20 0928 05/13/20 1126  BP: 127/71 (!) 146/65  Pulse: 64 60  Resp:  18  Temp:  98 F (36.7 C)  SpO2: 93% 92%   Vitals:   05/12/20 1951 05/13/20 0511 05/13/20 0928 05/13/20 1126  BP: 133/73 136/78 127/71 (!) 146/65  Pulse: 66 69 64 60  Resp: 19 19  18   Temp: 98.1 F (36.7 C) 98.4 F (36.9 C)  98 F (36.7 C)  TempSrc: Oral Oral  Oral  SpO2: 90% 92% 93% 92%  Weight:  70 kg    Height:        General: Pt is alert, awake, not in acute distress Cardiovascular: RRR, S1/S2 +, no rubs, no gallops Respiratory: CTA bilaterally, no wheezing, no rhonchi Abdominal: Soft, NT, ND, bowel sounds + Extremities: no edema, no cyanosis   The results of significant diagnostics from this hospitalization (including imaging, microbiology, ancillary and laboratory) are listed below for reference.     Microbiology: Recent Results (  from the past 240 hour(s))  SARS CORONAVIRUS 2 (TAT 6-24 HRS) Nasopharyngeal Nasopharyngeal Swab     Status: None   Collection Time: 05/05/20  1:21 PM   Specimen: Nasopharyngeal Swab  Result Value Ref Range Status   SARS Coronavirus 2 NEGATIVE NEGATIVE Final    Comment: (NOTE) SARS-CoV-2 target nucleic acids are NOT DETECTED.  The SARS-CoV-2 RNA is generally detectable in upper and lower respiratory specimens during the acute phase of infection. Negative results do not preclude SARS-CoV-2 infection, do not rule out co-infections with other pathogens,  and should not be used as the sole basis for treatment or other patient management decisions. Negative results must be combined with clinical observations, patient history, and epidemiological information. The expected result is Negative.  Fact Sheet for Patients: SugarRoll.be  Fact Sheet for Healthcare Providers: https://www.woods-mathews.com/  This test is not yet approved or cleared by the [place] FDA and  has been authorized for detection and/or diagnosis of SARS-CoV-2 by FDA under an Emergency Use Authorization (EUA). This EUA will remain  in effect (meaning this test can be used) for the duration of the COVID-19 declaration under Se ction 564(b)(1) of the Act, 21 U.S.C. section 360bbb-3(b)(1), unless the authorization is terminated or revoked sooner.  Performed at [facility], [place] [address] [place], [place] [address]   SARS Coronavirus 2 by RT PCR (hospital order, performed in [facility] hospital lab) Nasopharyngeal Nasopharyngeal Swab     Status: None   Collection Time: 05/11/20  3:00 PM   Specimen: Nasopharyngeal Swab  Result Value Ref Range Status   SARS Coronavirus 2 NEGATIVE NEGATIVE Final    Comment: (NOTE) SARS-CoV-2 target nucleic acids are NOT DETECTED.  The SARS-CoV-2 RNA is generally detectable in upper and lower respiratory specimens during the acute phase of infection. The lowest concentration of SARS-CoV-2 viral copies this assay can detect is 250 copies / mL. A negative result does not preclude SARS-CoV-2 infection and should not be used as the sole basis for treatment or other patient management decisions.  A negative result may occur with improper specimen collection / handling, submission of specimen other than nasopharyngeal swab, presence of viral mutation(s) within the areas targeted by this assay, and inadequate number of viral copies (<250 copies / mL). A negative result must be combined with  clinical observations, patient history, and epidemiological information.  Fact Sheet for Patients:   StrictlyIdeas.no  Fact Sheet for Healthcare Providers: BankingDealers.co.za  This test is not yet approved or  cleared by the [place] FDA and has been authorized for detection and/or diagnosis of SARS-CoV-2 by FDA under an Emergency Use Authorization (EUA).  This EUA will remain in effect (meaning this test can be used) for the duration of the COVID-19 declaration under Section 564(b)(1) of the Act, 21 U.S.C. section 360bbb-3(b)(1), unless the authorization is terminated or revoked sooner.  Performed at [facility], [place] [address] [place], [place] [address]      Labs: BNP (last 3 results) Recent Labs    05/12/20 0435  BNP 098.1*   Basic Metabolic Panel: Recent Labs  Lab 05/09/20 1108 05/11/20 0856 05/12/20 1028  NA 144 140 139  K 3.5 3.7 4.0  CL 105 104 103  CO2  --  26 29  GLUCOSE 94 134* 113*  BUN 20 14 14   CREATININE 1.30* 0.93 0.88  CALCIUM  --  8.7* 8.8*   Liver Function Tests: Recent Labs  Lab 05/12/20 1028  AST 16  ALT 20  ALKPHOS 85  BILITOT 0.8  PROT 5.7*  ALBUMIN 2.8*   No results for input(s): LIPASE, AMYLASE in the last 168 hours. No results for input(s): AMMONIA in the last 168 hours. CBC: Recent Labs  Lab 05/09/20 1108 05/11/20 0856 05/12/20 1028  WBC  --  14.8* 12.5*  NEUTROABS  --   --  9.5*  HGB 13.6 12.4 11.9*  HCT 40.0 40.2 37.5  MCV  --  96.2 95.7  PLT  --  264 245   Cardiac Enzymes: No results for input(s): CKTOTAL, CKMB, CKMBINDEX, TROPONINI in the last 168 hours. BNP: Invalid input(s): POCBNP CBG: Recent Labs  Lab 05/11/20 2155  GLUCAP 97   D-Dimer No results for input(s): DDIMER in the last 72 hours. Hgb A1c No results for input(s): HGBA1C in the last 72 hours. Lipid Profile No results for input(s): CHOL, HDL, LDLCALC, TRIG, CHOLHDL, LDLDIRECT in the  last 72 hours. Thyroid function studies Recent Labs    05/12/20 0435  TSH 0.542   Anemia work up No results for input(s): VITAMINB12, FOLATE, FERRITIN, TIBC, IRON, RETICCTPCT in the last 72 hours. Urinalysis    Component Value Date/Time   COLORURINE YELLOW 01/05/2020 0621   APPEARANCEUR HAZY (A) 01/05/2020 0621   LABSPEC 1.014 01/05/2020 0621   PHURINE 6.0 01/05/2020 0621   GLUCOSEU NEGATIVE 01/05/2020 0621   GLUCOSEU NEGATIVE 02/22/2016 0926   HGBUR NEGATIVE 01/05/2020 0621   BILIRUBINUR Negative 02/03/2020 1548   KETONESUR NEGATIVE 01/05/2020 0621   PROTEINUR Negative 02/03/2020 1548   PROTEINUR NEGATIVE 01/05/2020 0621   UROBILINOGEN 0.2 02/03/2020 1548   UROBILINOGEN 0.2 02/22/2016 0926   NITRITE Negative 02/03/2020 1548   NITRITE NEGATIVE 01/05/2020 0621   LEUKOCYTESUR Negative 02/03/2020 1548   LEUKOCYTESUR NEGATIVE 01/05/2020 0621   Sepsis Labs Invalid input(s): PROCALCITONIN,  WBC,  LACTICIDVEN Microbiology Recent Results (from the past 240 hour(s))  SARS CORONAVIRUS 2 (TAT 6-24 HRS) Nasopharyngeal Nasopharyngeal Swab     Status: None   Collection Time: 05/05/20  1:21 PM   Specimen: Nasopharyngeal Swab  Result Value Ref Range Status   SARS Coronavirus 2 NEGATIVE NEGATIVE Final    Comment: (NOTE) SARS-CoV-2 target nucleic acids are NOT DETECTED.  The SARS-CoV-2 RNA is generally detectable in upper and lower respiratory specimens during the acute phase of infection. Negative results do not preclude SARS-CoV-2 infection, do not rule out co-infections with other pathogens, and should not be used as the sole basis for treatment or other patient management decisions. Negative results must be combined with clinical observations, patient history, and epidemiological information. The expected result is Negative.  Fact Sheet for Patients: SugarRoll.be  Fact Sheet for Healthcare  Providers: https://www.woods-mathews.com/  This test is not yet approved or cleared by the [place] FDA and  has been authorized for detection and/or diagnosis of SARS-CoV-2 by FDA under an Emergency Use Authorization (EUA). This EUA will remain  in effect (meaning this test can be used) for the duration of the COVID-19 declaration under Se ction 564(b)(1) of the Act, 21 U.S.C. section 360bbb-3(b)(1), unless the authorization is terminated or revoked sooner.  Performed at [facility], [place] [address], [place], [place] [address]   SARS Coronavirus 2 by RT PCR (hospital order, performed in [facility] hospital lab) Nasopharyngeal Nasopharyngeal Swab     Status: None   Collection Time: 05/11/20  3:00 PM   Specimen: Nasopharyngeal Swab  Result Value Ref Range Status   SARS Coronavirus 2 NEGATIVE NEGATIVE Final  Comment: (NOTE) SARS-CoV-2 target nucleic acids are NOT DETECTED.  The SARS-CoV-2 RNA is generally detectable in upper and lower respiratory specimens during the acute phase of infection. The lowest concentration of SARS-CoV-2 viral copies this assay can detect is 250 copies / mL. A negative result does not preclude SARS-CoV-2 infection and should not be used as the sole basis for treatment or other patient management decisions.  A negative result may occur with improper specimen collection / handling, submission of specimen other than nasopharyngeal swab, presence of viral mutation(s) within the areas targeted by this assay, and inadequate number of viral copies (<250 copies / mL). A negative result must be combined with clinical observations, patient history, and epidemiological information.  Fact Sheet for Patients:   StrictlyIdeas.no  Fact Sheet for Healthcare Providers: BankingDealers.co.za  This test is not yet approved or  cleared by the [place] FDA and has been authorized for detection  and/or diagnosis of SARS-CoV-2 by FDA under an Emergency Use Authorization (EUA).  This EUA will remain in effect (meaning this test can be used) for the duration of the COVID-19 declaration under Section 564(b)(1) of the Act, 21 U.S.C. section 360bbb-3(b)(1), unless the authorization is terminated or revoked sooner.  Performed at [facility], [place] [address] [place],  [address]    Time spent: 30 min  SIGNED:   Marylu Lund, MD  [facility] 05/13/2020, 12:38 PM  If 7PM-7AM, please contact night-coverage

## 2020-05-14 ENCOUNTER — Emergency Department (HOSPITAL_BASED_OUTPATIENT_CLINIC_OR_DEPARTMENT_OTHER): Payer: Medicare PPO

## 2020-05-14 ENCOUNTER — Other Ambulatory Visit: Payer: Self-pay

## 2020-05-14 ENCOUNTER — Emergency Department (HOSPITAL_BASED_OUTPATIENT_CLINIC_OR_DEPARTMENT_OTHER)
Admission: EM | Admit: 2020-05-14 | Discharge: 2020-05-14 | Disposition: A | Discharge status: Home / Self Care | Payer: Medicare PPO | Attending: Emergency Medicine | Admitting: Emergency Medicine

## 2020-05-14 ENCOUNTER — Encounter (HOSPITAL_BASED_OUTPATIENT_CLINIC_OR_DEPARTMENT_OTHER): Payer: Self-pay | Admitting: Emergency Medicine

## 2020-05-14 DIAGNOSIS — W19XXXA Unspecified fall, initial encounter: Secondary | ICD-10-CM | POA: Insufficient documentation

## 2020-05-14 DIAGNOSIS — E039 Hypothyroidism, unspecified: Secondary | ICD-10-CM | POA: Diagnosis not present

## 2020-05-14 DIAGNOSIS — I10 Essential (primary) hypertension: Secondary | ICD-10-CM | POA: Diagnosis not present

## 2020-05-14 DIAGNOSIS — Z933 Colostomy status: Secondary | ICD-10-CM | POA: Diagnosis not present

## 2020-05-14 DIAGNOSIS — Y9301 Activity, walking, marching and hiking: Secondary | ICD-10-CM | POA: Diagnosis not present

## 2020-05-14 DIAGNOSIS — M1612 Unilateral primary osteoarthritis, left hip: Secondary | ICD-10-CM | POA: Diagnosis not present

## 2020-05-14 DIAGNOSIS — M47816 Spondylosis without myelopathy or radiculopathy, lumbar region: Secondary | ICD-10-CM | POA: Diagnosis not present

## 2020-05-14 DIAGNOSIS — Z7901 Long term (current) use of anticoagulants: Secondary | ICD-10-CM | POA: Insufficient documentation

## 2020-05-14 DIAGNOSIS — R112 Nausea with vomiting, unspecified: Secondary | ICD-10-CM | POA: Insufficient documentation

## 2020-05-14 DIAGNOSIS — S3993XA Unspecified injury of pelvis, initial encounter: Secondary | ICD-10-CM | POA: Diagnosis not present

## 2020-05-14 DIAGNOSIS — Y92009 Unspecified place in unspecified non-institutional (private) residence as the place of occurrence of the external cause: Secondary | ICD-10-CM | POA: Diagnosis not present

## 2020-05-14 DIAGNOSIS — Z79899 Other long term (current) drug therapy: Secondary | ICD-10-CM | POA: Insufficient documentation

## 2020-05-14 DIAGNOSIS — S79912A Unspecified injury of left hip, initial encounter: Secondary | ICD-10-CM | POA: Diagnosis not present

## 2020-05-14 DIAGNOSIS — M791 Myalgia, unspecified site: Secondary | ICD-10-CM | POA: Diagnosis not present

## 2020-05-14 DIAGNOSIS — S0990XA Unspecified injury of head, initial encounter: Secondary | ICD-10-CM | POA: Diagnosis not present

## 2020-05-14 DIAGNOSIS — Y999 Unspecified external cause status: Secondary | ICD-10-CM | POA: Insufficient documentation

## 2020-05-14 DIAGNOSIS — M7918 Myalgia, other site: Secondary | ICD-10-CM

## 2020-05-14 DIAGNOSIS — S52502A Unspecified fracture of the lower end of left radius, initial encounter for closed fracture: Secondary | ICD-10-CM | POA: Diagnosis not present

## 2020-05-14 DIAGNOSIS — S6992XA Unspecified injury of left wrist, hand and finger(s), initial encounter: Secondary | ICD-10-CM | POA: Diagnosis not present

## 2020-05-14 DIAGNOSIS — Z85820 Personal history of malignant melanoma of skin: Secondary | ICD-10-CM | POA: Diagnosis not present

## 2020-05-14 DIAGNOSIS — K5730 Diverticulosis of large intestine without perforation or abscess without bleeding: Secondary | ICD-10-CM | POA: Diagnosis not present

## 2020-05-14 DIAGNOSIS — R519 Headache, unspecified: Secondary | ICD-10-CM | POA: Diagnosis not present

## 2020-05-14 DIAGNOSIS — M160 Bilateral primary osteoarthritis of hip: Secondary | ICD-10-CM | POA: Diagnosis not present

## 2020-05-14 MED ORDER — CYCLOBENZAPRINE HCL 5 MG PO TABS: 5.0000 mg | ORAL_TABLET | Freq: Three times a day (TID) | ORAL | 0 refills | Status: DC | PRN

## 2020-05-14 MED ORDER — ONDANSETRON 4 MG PO TBDP
4.0000 mg | ORAL_TABLET | Freq: Once | ORAL | Status: AC
  Administered 2020-05-14: 4 mg via ORAL
  Filled 2020-05-14: qty 1

## 2020-05-14 NOTE — ED Provider Notes (Signed)
[place] EMERGENCY DEPARTMENT Provider Note   CSN: [id number] Arrival date & time: 05/14/20  1211     History Chief Complaint  Patient presents with  . Fall  . Hip Pain  . Arm Pain    [name] is a 86 y.o. female.  HPI      Came home yesterday after admission for community acquired pneumonia, as she was walking into the door she fell at [facility] like hip gaveway then couldn't reach the grab bar and fell onto the left hip Not sure if fall with arm outstretched or under her but left thumb pain (no wrist pain, just thumb pain) some pain in wrist with movement Has not walked since fell, has been in the wheelchair, small amount of weight to transfer from chair to commode Was walking with walker with rehab but was doing well with PT, hoping to change to cane soon Nauseated now, began en route No head trauma, no headache, taking the blood thinner, fell away from the wall.   No neck pain, back pain, chest pain, abdominal pain No shortness of breath   Past Medical History:  Diagnosis Date  . DVT (deep venous thrombosis) ([place]) 09/2015   RLE  . GERD (gastroesophageal reflux disease)   . Hypertension   . Hypothyroidism   . Melanoma ([place]) 06/16/2017   Facial melanoma, removed by Dr. Harvel Quale  . Peripheral vascular disease ([place])    peripheral neuropathy    Patient Active Problem List   Diagnosis Date Noted  . Advanced age 55/12[birth year]2019  . Community acquired pneumonia   . Secondary hypercoagulable state ([place]) 03/08/2020  . Hypokalemia 02/15/2020  . Colostomy status ([place]) 01/26/2020  . Diverticulitis of colon with perforation 01/26/2020  . Anemia   . Debility 01/14/2020  . Physical debility 01/14/2020  . Trauma   . Urinary retention   . Persistent atrial fibrillation ([place])   . Closed fracture of multiple ribs 12/29/2019  . Closed fracture of left distal radius 12/29/2019  . Recurrent falls 12/29/2019  . Positive ANA (antinuclear antibody) 12/29/2019  .  Osteoarthritis 11/13/2017  . Hypertriglyceridemia 01/29/2017  . Excessive gas 01/09/2017  . Elevated lipase 03/06/2016  . Chest pain 03/06/2016  . Osteopenia 02/08/2016  . Hypothyroidism 01/26/2016  . HTN (hypertension) 01/26/2016  . Left leg numbness 10/06/2012    Past Surgical History:  Procedure Laterality Date  . ABDOMINAL HYSTERECTOMY    . APPENDECTOMY    . BACK SURGERY    . BREAST SURGERY     left breast biopsy  . CARDIOVERSION N/A 05/09/2020   Procedure: CARDIOVERSION;  Surgeon: Skeet Latch, MD;  Location: [place];  Service: Cardiovascular;  Laterality: N/A;  . COLOSTOMY N/A 01/06/2020   Procedure: Colostomy;  Surgeon: Greer Pickerel, MD;  Location: [place];  Service: General;  Laterality: N/A;  . EYE SURGERY     cataract  . HARDWARE REMOVAL Left 01/01/2020   Procedure: HARDWARE REMOVAL;  Surgeon: Iran Planas, MD;  Location: [place];  Service: Orthopedics;  Laterality: Left;  . LAPAROTOMY N/A 01/06/2020   Procedure: Exploratory Laparotomy, Open Sigmoid colectomy;  Surgeon: Greer Pickerel, MD;  Location: [place];  Service: General;  Laterality: N/A;  . LIPOMA EXCISION Left 04/25/2015   Procedure: EXCISION OF LIPOMA LEFT ARM;  Surgeon: Donnie Mesa, MD;  Location: [place];  Service: General;  Laterality: Left;  . LUMBAR LAMINECTOMY/DECOMPRESSION MICRODISCECTOMY  11/20/2011   Procedure: LUMBAR LAMINECTOMY/DECOMPRESSION MICRODISCECTOMY;  Surgeon: Jeneen Rinks P [place];  Location: [facility];  Service: Orthopedics;  Laterality: N/A;  Decompressive Laminectomy L2 to the Sacrum (X-Ray)  . OPEN REDUCTION INTERNAL FIXATION (ORIF) DISTAL RADIAL FRACTURE Left 01/01/2020   Procedure: OPEN REDUCTION INTERNAL FIXATION (ORIF) DISTAL RADIAL FRACTURE;  Surgeon: Iran Planas, MD;  Location: [place];  Service: Orthopedics;  Laterality: Left;  . OTHER SURGICAL HISTORY     left wrist surgery - has plate in left wrist   . OTHER SURGICAL HISTORY     right knee surgery due to torn cartilage    . TONSILLECTOMY    . WRIST FRACTURE SURGERY Left      OB History   No obstetric history on file.     Family History  Problem Relation Age of Onset  . Cancer Mother        BREAST  . COPD Father   . Emphysema Father   . Cancer Daughter        breast    Social History   Tobacco Use  . Smoking status: Never Smoker  . Smokeless tobacco: Never Used  Vaping Use  . Vaping Use: Never used  Substance Use Topics  . Alcohol use: No  . Drug use: No    Home Medications Prior to Admission medications   Medication Sig Start Date End Date Taking? Authorizing Provider  acetaminophen (TYLENOL) 325 MG tablet Take 1-2 tablets (325-650 mg total) by mouth every 4 (four) hours as needed for mild pain. 01/19/20   Love, Ivan Anchors, PA-C  amoxicillin-clavulanate (AUGMENTIN) 500-125 MG tablet Take 1 tablet (500 mg total) by mouth 2 (two) times daily for 4 days. 05/13/20 05/17/20  Donne Hazel, MD  azithromycin (ZITHROMAX) 500 MG tablet Take 1 tablet (500 mg total) by mouth daily for 4 days. 05/13/20 05/17/20  Donne Hazel, MD  cyclobenzaprine (FLEXERIL) 5 MG tablet Take 1-2 tablets (5-10 mg total) by mouth 3 (three) times daily as needed for muscle spasms. 05/14/20   Gareth Morgan, MD  diltiazem (DILACOR XR) 180 MG 24 hr capsule Take 1 capsule (180 mg total) by mouth daily. 03/06/20   [name], MD  docusate sodium (COLACE) 100 MG capsule Take 1 capsule (100 mg total) by mouth 2 (two) times daily. 01/20/20   Love, Ivan Anchors, PA-C  ELIQUIS 5 MG TABS tablet TAKE 1 TABLET BY MOUTH TWICE A DAY Patient taking differently: Take 5 mg by mouth 2 (two) times daily.  02/11/20   Raulkar, Clide Deutscher, MD  gabapentin (NEURONTIN) 300 MG capsule Take 300 mg by mouth at bedtime.    [provider]  levothyroxine (SYNTHROID) 50 MCG tablet TAKE 1 TABLET BY MOUTH EVERY DAY Patient taking differently: Take 50 mcg by mouth daily before breakfast.  02/08/20   [name], MD  Multiple Vitamins-Minerals  (PRESERVISION AREDS 2) CAPS Take 1 capsule by mouth daily with lunch.     [provider]  nadolol (CORGARD) 80 MG tablet Take 1 tablet (80 mg total) by mouth daily. 05/03/20   Deboraha Sprang, MD  ondansetron (ZOFRAN) 4 MG tablet Take 1 tablet (4 mg total) by mouth every 8 (eight) hours as needed for nausea or vomiting. 02/01/20   Raulkar, Clide Deutscher, MD  pantoprazole (PROTONIX) 40 MG tablet Take 1 tablet (40 mg total) by mouth daily. 02/15/20   [name], MD  predniSONE (DELTASONE) 5 MG tablet Take 10 mg by mouth daily with lunch.     [provider]  senna-docusate (SENOKOT-S) 8.6-50 MG tablet Take 1  tablet by mouth at bedtime. 01/20/20 01/19/21  Bary Leriche, PA-C    Allergies    Codeine and Keflex [cephalexin]  Review of Systems   Review of Systems  Constitutional: Negative for fever.  HENT: Negative for sore throat.   Eyes: Negative for visual disturbance.  Respiratory: Negative for cough and shortness of breath.   Cardiovascular: Negative for chest pain.  Gastrointestinal: Positive for nausea and vomiting. Negative for abdominal pain.  Genitourinary: Negative for difficulty urinating.  Musculoskeletal: Positive for arthralgias. Negative for back pain and neck pain.  Skin: Negative for rash.  Neurological: Negative for syncope and headaches.    Physical Exam Updated Vital Signs BP 139/62 (BP Location: Right Arm)   Pulse (!) 55   Temp 98.2 F (36.8 C) (Oral)   Resp 18   SpO2 92%   Physical Exam Vitals and nursing note reviewed.  Constitutional:      General: She is not in acute distress.    Appearance: She is well-developed. She is not diaphoretic.  HENT:     Head: Normocephalic and atraumatic.  Eyes:     Conjunctiva/sclera: Conjunctivae normal.  Cardiovascular:     Rate and Rhythm: Normal rate and regular rhythm.     Pulses: Normal pulses.  Pulmonary:     Effort: Pulmonary effort is normal. No respiratory distress.  Abdominal:     General:  There is no distension.     Palpations: Abdomen is soft.     Tenderness: There is no abdominal tenderness. There is no guarding.     Comments: Ostomy in place  Musculoskeletal:        General: No tenderness.     Cervical back: Normal range of motion.     Comments: Right leg longer than left/no rotation or deformity Left buttock tenderness, pain with movement Tenderness left wrist over carpal bones, no snuff box tenderness  Skin:    General: Skin is warm and dry.     Findings: No erythema or rash.  Neurological:     Mental Status: She is alert and oriented to person, place, and time.     ED Results / Procedures / Treatments   Labs (all labs ordered are listed, but only abnormal results are displayed) Labs Reviewed - No data to display  EKG None  Radiology DG Wrist Complete Left  Result Date: 05/14/2020 CLINICAL DATA:  Status post fall with pain. EXAM: LEFT WRIST - COMPLETE 3+ VIEW COMPARISON:  January 17 2020, December 29, 2019 FINDINGS: Curvilinear lucencies are identified in the posterior distal probably radius. This is suspicious for fracture. This is best seen on the lateral view. Patient status post prior fixation of the distal radius. Chronic change of the ulna styloid is noted. IMPRESSION: Curvilinear lucencies are identified in the posterior distal probably radius, suspicious for fracture. This is best seen on the lateral view. Electronically Signed   By: Abelardo Diesel M.D.   On: 05/14/2020 14:15   CT Head Wo Contrast  Result Date: 05/14/2020 CLINICAL DATA:  86 year old female with head trauma EXAM: CT HEAD WITHOUT CONTRAST TECHNIQUE: Contiguous axial images were obtained from the base of the skull through the vertex without intravenous contrast. COMPARISON:  12/24/2009 FINDINGS: Brain: No acute intracranial hemorrhage. No midline shift or mass effect. Gray-white differentiation maintained. Mild volume loss. Patchy hypodensity in the bilateral periventricular white matter.  Unremarkable appearance of the ventricular system. Vascular: Calcifications of the intracranial vasculature. Skull: No acute fracture.  No aggressive bone lesion identified. Sinuses/Orbits: Unremarkable  appearance of the orbits. Mastoid air cells clear. No middle ear effusion. No significant sinus disease. Other: None IMPRESSION: Negative for acute intracranial abnormality. Chronic microvascular ischemic disease and associated intracranial atherosclerosis. Electronically Signed   By: Corrie Mckusick D.O.   On: 05/14/2020 14:01   CT PELVIS WO CONTRAST  Result Date: 05/14/2020 CLINICAL DATA:  Status post fall. EXAM: CT PELVIS WITHOUT CONTRAST TECHNIQUE: Multidetector CT imaging of the pelvis was performed following the standard protocol without intravenous contrast. COMPARISON:  None. FINDINGS: Urinary Tract:  No abnormality visualized. Bowel: A left lower quadrant ostomy site is seen. There is no evidence of bowel dilatation. The appendix is not clearly identified. Noninflamed diverticula are seen throughout the visualized portion of the large bowel. Vascular/Lymphatic: No pathologically enlarged lymph nodes. There is marked severity calcification of the abdominal aorta and bilateral common iliac arteries. Reproductive: The uterus is absent. The bilateral adnexa are unremarkable. Other:  None. Musculoskeletal: Moderate to marked severity degenerative changes seen within the visualized portion of the lower lumbar spine. Mild to moderate severity degenerative changes are also seen involving both hips. No acute osseous abnormality is identified. IMPRESSION: 1. Left lower quadrant ostomy site. 2. Colonic diverticulosis. 3. Moderate to marked severity degenerative changes within the visualized portion of the lower lumbar spine. 4. Mild to moderate severity degenerative changes involving both hips. 5. Aortic atherosclerosis. Aortic Atherosclerosis (ICD10-I70.0). Electronically Signed   By: Virgina Norfolk M.D.   On:  05/14/2020 15:15   DG Hand Complete Left  Result Date: 05/14/2020 CLINICAL DATA:  Status post fall with left wrist pain. EXAM: LEFT HAND - COMPLETE 3+ VIEW COMPARISON:  December 29, 2019 FINDINGS: No acute fracture or dislocation is identified in the left hand. Curvilinear lucency seen the distal probably radius on the wrist series is incompletely included on the hand series. Chronic deformity of the ulna styloid is noted. IMPRESSION: No acute fracture or dislocation is identified in the left hand. Curvilinear lucency seen the distal probably radius on the wrist series is incompletely included on the hand series. Electronically Signed   By: Abelardo Diesel M.D.   On: 05/14/2020 14:17   DG Hip Unilat W or Wo Pelvis 2-3 Views Left  Result Date: 05/14/2020 CLINICAL DATA:  Status post fall EXAM: DG HIP (WITH OR WITHOUT PELVIS) 2-3V LEFT COMPARISON:  None. FINDINGS: No acute fracture or dislocation is identified in the left hip. Degenerative joint changes of bilateral hips and lumbar spine are noted. IMPRESSION: No acute fracture or dislocation. Electronically Signed   By: Abelardo Diesel M.D.   On: 05/14/2020 14:13    Procedures Procedures (including critical care time)  Medications Ordered in ED Medications  ondansetron (ZOFRAN-ODT) disintegrating tablet 4 mg (4 mg Oral Given 05/14/20 1313)    ED Course  I have reviewed the triage vital signs and the nursing notes.  Pertinent labs & imaging results that were available during my care of the patient were reviewed by me and considered in my medical decision making (see chart for details).    MDM Rules/Calculators/A&P                          86yo female with complicated medical history on anticoagulation including recent hospitalizations and discharge yesterday after stay with pneumonia presents with concern for fall that occurred yesterday as she was entering her home after discharge from the hospital with left hip pain and left hand/thumb pain.     CT head without  acute findings.  XR wrist with lucency of probable radius--did have recent fx and surgery with Dr. Ames Dura thumb spica and follow up with Dr. Caralyn Guile.  Regarding hip pain, XR and CT without signs of fracture. Possible muscular contusion, strain, has hx of tendon rupture-recommend muscle relaxant, weight bearing as tolerated and follow up with PCP and Dr. Alvan Dame (who she saw previously) if pain persists.   Final Clinical Impression(s) / ED Diagnoses Final diagnoses:  Fall, initial encounter  Left buttock pain  Closed fracture of distal end of left radius, unspecified fracture morphology, initial encounter    Rx / DC Orders ED Discharge Orders         Ordered    cyclobenzaprine (FLEXERIL) 5 MG tablet  3 times daily PRN     Discontinue  Reprint     05/14/20 1540           Gareth Morgan, MD 05/15/20 0845

## 2020-05-14 NOTE — ED Notes (Signed)
ED Provider at bedside. 

## 2020-05-14 NOTE — ED Notes (Signed)
States was coming home from hospital yesterday, approx 2pm fell onto concrete at steps of front porch and hit left hip, no discoloration, bruise noted. Has full ROM, no rotation or shortening noted at this time, has strong plantar and dorsal flexion of left foot, CMS wnl as well.   Also having pain in left hand, full ROM noted, noted to have splint on, she applied this at home prior to coming to ED for comfort measures. Has good grip and pulse at left radial as well, no marked swelling noted at this time

## 2020-05-14 NOTE — ED Triage Notes (Signed)
Pt here after mechanical fall yesterday falling onto, and reinjuring, her left hip and left arm from previous fall.

## 2020-05-14 NOTE — ED Notes (Signed)
HIGH FALL RISK CLIENT - high fall risk measures in place, sr x 2 up, stretcher in lowest position, call bell within reach, husband at side, fall risk bracelet applied

## 2020-05-14 NOTE — ED Notes (Signed)
Pt discharged to home. Discharge instructions have been discussed with patient and/or family members. Pt verbally acknowledges understanding d/c instructions, and endorses comprehension to checkout at registration before leaving.  °

## 2020-05-15 NOTE — Telephone Encounter (Signed)
Noted M If you have a moment can you call her and check on her Kassie Mends a course of events following cardioversion  Thanks SK

## 2020-05-16 ENCOUNTER — Telehealth: Payer: Self-pay

## 2020-05-16 NOTE — Telephone Encounter (Signed)
Spoke with pt who reports from cardiac standpoint she seems to be doing OK.  When last seen in the hospital she remained in regular rhythm according to pt.  Pt is currently in a wheelchair and arm brace after falling at her home on the concrete immediately following hospital discharge for pneumonia.  Pt is asking if she needs to establish care with a general cardiologist as that was recommended while she was in the hospital.  Pt also advised she should follow up with Dr Caryl Comes sooner than her 06/20/2020.  Pt advised follow up after cardioversion is generally 1 month and pt has appointment scheduled for 06/20/2020 with Dr Caryl Comes.  If pt feels she needs to be seen sooner she can certainly reach out.  RN will speak with Dr Caryl Comes to see if he feels pt should establish care with general cardiology as well.  Pt verbalizes understanding and agrees with current plan.

## 2020-05-16 NOTE — Telephone Encounter (Signed)
Transition Care Management Follow-up Telephone Call  Date of discharge and from where: 05/13/2020 [facility]  How have you been since you were released from the hospital? Good except I had a fall the day I came home & had to go to the ER.   Any questions or concerns? No   Items Reviewed:  Did the pt receive and understand the discharge instructions provided? Yes   Medications obtained and verified? Yes   Any new allergies since your discharge? No   Dietary orders reviewed? No  Do you have support at home? Yes   Functional Questionnaire: (I = Independent and D = Dependent) ADLs: I-with assistance form Aide  Bathing/Dressing- I-with assistance from aid  Meal Prep- D  Eating- I  Maintaining continence- I  Transferring/Ambulation- I-with assistance from aide  Managing Meds- I-with assistance from aide  Follow up appointments reviewed:   PCP Hospital f/u appt confirmed? Yes  Scheduled to see Dr. Birdie Riddle on 05/25/2020 @ 11:00.  [facility] f/u appt confirmed? No Scheduled   Patient to call today to schedule  Are transportation arrangements needed? No   If their condition worsens, is the pt aware to call PCP or go to the Emergency Dept.? Yes  Was the patient provided with contact information for the PCP's office or ED? Yes  Was to pt encouraged to call back with questions or concerns? Yes

## 2020-05-17 ENCOUNTER — Telehealth: Payer: Self-pay | Admitting: Family Medicine

## 2020-05-17 NOTE — Telephone Encounter (Signed)
Spoke with pt and advised per Dr Caryl Comes no need to establish with general cardiology as long as pt's problem is AFIB.  Pt will keep 06/20/2020 appointment for [facility] f/u.  Pt verbalizes understanding and had no further questions or needs at this time.

## 2020-05-17 NOTE — Telephone Encounter (Signed)
Patients son states that they need an FL2 form filled out so [name] can be put in a rehab facility.  She has fallen again.  Please advise

## 2020-05-18 ENCOUNTER — Other Ambulatory Visit: Payer: Self-pay

## 2020-05-18 DIAGNOSIS — D6869 Other thrombophilia: Secondary | ICD-10-CM | POA: Diagnosis present

## 2020-05-18 DIAGNOSIS — T148XXA Other injury of unspecified body region, initial encounter: Secondary | ICD-10-CM | POA: Diagnosis not present

## 2020-05-18 DIAGNOSIS — I10 Essential (primary) hypertension: Secondary | ICD-10-CM | POA: Diagnosis present

## 2020-05-18 DIAGNOSIS — Z9181 History of falling: Secondary | ICD-10-CM | POA: Diagnosis not present

## 2020-05-18 DIAGNOSIS — I951 Orthostatic hypotension: Secondary | ICD-10-CM | POA: Diagnosis present

## 2020-05-18 DIAGNOSIS — E039 Hypothyroidism, unspecified: Secondary | ICD-10-CM | POA: Diagnosis present

## 2020-05-18 DIAGNOSIS — G629 Polyneuropathy, unspecified: Secondary | ICD-10-CM | POA: Diagnosis present

## 2020-05-18 DIAGNOSIS — Z20822 Contact with and (suspected) exposure to covid-19: Secondary | ICD-10-CM | POA: Diagnosis present

## 2020-05-18 DIAGNOSIS — M797 Fibromyalgia: Secondary | ICD-10-CM | POA: Diagnosis present

## 2020-05-18 DIAGNOSIS — Z743 Need for continuous supervision: Secondary | ICD-10-CM | POA: Diagnosis not present

## 2020-05-18 DIAGNOSIS — R7989 Other specified abnormal findings of blood chemistry: Secondary | ICD-10-CM | POA: Diagnosis present

## 2020-05-18 DIAGNOSIS — M8580 Other specified disorders of bone density and structure, unspecified site: Secondary | ICD-10-CM | POA: Diagnosis present

## 2020-05-18 DIAGNOSIS — R2689 Other abnormalities of gait and mobility: Secondary | ICD-10-CM | POA: Diagnosis not present

## 2020-05-18 DIAGNOSIS — S8012XA Contusion of left lower leg, initial encounter: Secondary | ICD-10-CM | POA: Diagnosis not present

## 2020-05-18 DIAGNOSIS — I4819 Other persistent atrial fibrillation: Secondary | ICD-10-CM | POA: Diagnosis present

## 2020-05-18 DIAGNOSIS — R339 Retention of urine, unspecified: Secondary | ICD-10-CM | POA: Diagnosis present

## 2020-05-18 DIAGNOSIS — S5292XA Unspecified fracture of left forearm, initial encounter for closed fracture: Secondary | ICD-10-CM | POA: Diagnosis not present

## 2020-05-18 DIAGNOSIS — I959 Hypotension, unspecified: Secondary | ICD-10-CM | POA: Diagnosis not present

## 2020-05-18 DIAGNOSIS — Z9049 Acquired absence of other specified parts of digestive tract: Secondary | ICD-10-CM

## 2020-05-18 DIAGNOSIS — M353 Polymyalgia rheumatica: Secondary | ICD-10-CM | POA: Diagnosis present

## 2020-05-18 DIAGNOSIS — I739 Peripheral vascular disease, unspecified: Secondary | ICD-10-CM | POA: Diagnosis present

## 2020-05-18 DIAGNOSIS — Z86718 Personal history of other venous thrombosis and embolism: Secondary | ICD-10-CM | POA: Diagnosis not present

## 2020-05-18 DIAGNOSIS — S52502D Unspecified fracture of the lower end of left radius, subsequent encounter for closed fracture with routine healing: Secondary | ICD-10-CM

## 2020-05-18 DIAGNOSIS — R0902 Hypoxemia: Secondary | ICD-10-CM | POA: Diagnosis not present

## 2020-05-18 DIAGNOSIS — W19XXXA Unspecified fall, initial encounter: Secondary | ICD-10-CM | POA: Diagnosis present

## 2020-05-18 DIAGNOSIS — R5381 Other malaise: Secondary | ICD-10-CM | POA: Diagnosis not present

## 2020-05-18 DIAGNOSIS — Y92009 Unspecified place in unspecified non-institutional (private) residence as the place of occurrence of the external cause: Secondary | ICD-10-CM

## 2020-05-18 DIAGNOSIS — Z03818 Encounter for observation for suspected exposure to other biological agents ruled out: Secondary | ICD-10-CM | POA: Diagnosis not present

## 2020-05-18 DIAGNOSIS — Z885 Allergy status to narcotic agent status: Secondary | ICD-10-CM

## 2020-05-18 DIAGNOSIS — K219 Gastro-esophageal reflux disease without esophagitis: Secondary | ICD-10-CM | POA: Diagnosis present

## 2020-05-18 DIAGNOSIS — S7010XA Contusion of unspecified thigh, initial encounter: Secondary | ICD-10-CM | POA: Diagnosis not present

## 2020-05-18 DIAGNOSIS — R262 Difficulty in walking, not elsewhere classified: Secondary | ICD-10-CM | POA: Diagnosis not present

## 2020-05-18 DIAGNOSIS — M1990 Unspecified osteoarthritis, unspecified site: Secondary | ICD-10-CM | POA: Diagnosis not present

## 2020-05-18 DIAGNOSIS — K5780 Diverticulitis of intestine, part unspecified, with perforation and abscess without bleeding: Secondary | ICD-10-CM | POA: Diagnosis present

## 2020-05-18 DIAGNOSIS — Z79890 Hormone replacement therapy (postmenopausal): Secondary | ICD-10-CM

## 2020-05-18 DIAGNOSIS — S7002XA Contusion of left hip, initial encounter: Secondary | ICD-10-CM | POA: Diagnosis present

## 2020-05-18 DIAGNOSIS — Z85820 Personal history of malignant melanoma of skin: Secondary | ICD-10-CM

## 2020-05-18 DIAGNOSIS — R110 Nausea: Secondary | ICD-10-CM | POA: Diagnosis not present

## 2020-05-18 DIAGNOSIS — Z8701 Personal history of pneumonia (recurrent): Secondary | ICD-10-CM

## 2020-05-18 DIAGNOSIS — Z7952 Long term (current) use of systemic steroids: Secondary | ICD-10-CM | POA: Diagnosis not present

## 2020-05-18 DIAGNOSIS — D72829 Elevated white blood cell count, unspecified: Secondary | ICD-10-CM | POA: Diagnosis present

## 2020-05-18 DIAGNOSIS — R338 Other retention of urine: Secondary | ICD-10-CM | POA: Diagnosis not present

## 2020-05-18 DIAGNOSIS — D62 Acute posthemorrhagic anemia: Secondary | ICD-10-CM | POA: Diagnosis present

## 2020-05-18 DIAGNOSIS — Z825 Family history of asthma and other chronic lower respiratory diseases: Secondary | ICD-10-CM

## 2020-05-18 DIAGNOSIS — R52 Pain, unspecified: Secondary | ICD-10-CM | POA: Diagnosis not present

## 2020-05-18 DIAGNOSIS — Z79899 Other long term (current) drug therapy: Secondary | ICD-10-CM

## 2020-05-18 DIAGNOSIS — R2681 Unsteadiness on feet: Secondary | ICD-10-CM | POA: Diagnosis not present

## 2020-05-18 DIAGNOSIS — M6259 Muscle wasting and atrophy, not elsewhere classified, multiple sites: Secondary | ICD-10-CM | POA: Diagnosis not present

## 2020-05-18 DIAGNOSIS — Z803 Family history of malignant neoplasm of breast: Secondary | ICD-10-CM

## 2020-05-18 DIAGNOSIS — M79652 Pain in left thigh: Secondary | ICD-10-CM | POA: Diagnosis present

## 2020-05-18 DIAGNOSIS — R296 Repeated falls: Secondary | ICD-10-CM | POA: Diagnosis present

## 2020-05-18 DIAGNOSIS — S7012XA Contusion of left thigh, initial encounter: Secondary | ICD-10-CM | POA: Diagnosis not present

## 2020-05-18 DIAGNOSIS — S79922A Unspecified injury of left thigh, initial encounter: Secondary | ICD-10-CM | POA: Diagnosis not present

## 2020-05-18 DIAGNOSIS — Z7901 Long term (current) use of anticoagulants: Secondary | ICD-10-CM

## 2020-05-18 DIAGNOSIS — R279 Unspecified lack of coordination: Secondary | ICD-10-CM | POA: Diagnosis not present

## 2020-05-18 DIAGNOSIS — Z888 Allergy status to other drugs, medicaments and biological substances status: Secondary | ICD-10-CM | POA: Diagnosis not present

## 2020-05-18 DIAGNOSIS — Z933 Colostomy status: Secondary | ICD-10-CM

## 2020-05-18 DIAGNOSIS — M6281 Muscle weakness (generalized): Secondary | ICD-10-CM | POA: Diagnosis not present

## 2020-05-18 NOTE — Telephone Encounter (Signed)
Spoke with the patient's son. He states he will bring in the form on her appt date from [facility] in [place].

## 2020-05-19 ENCOUNTER — Encounter (HOSPITAL_COMMUNITY): Payer: Self-pay

## 2020-05-19 ENCOUNTER — Emergency Department (HOSPITAL_COMMUNITY): Payer: Medicare PPO

## 2020-05-19 ENCOUNTER — Inpatient Hospital Stay (HOSPITAL_COMMUNITY)
Admission: EM | Admit: 2020-05-19 | Discharge: 2020-05-24 | DRG: 605 | Disposition: A | Discharge status: Skilled Nursing Facility | Payer: Medicare PPO | Attending: Internal Medicine | Admitting: Internal Medicine

## 2020-05-19 ENCOUNTER — Other Ambulatory Visit: Payer: Self-pay

## 2020-05-19 DIAGNOSIS — I10 Essential (primary) hypertension: Secondary | ICD-10-CM | POA: Diagnosis present

## 2020-05-19 DIAGNOSIS — I951 Orthostatic hypotension: Secondary | ICD-10-CM | POA: Diagnosis not present

## 2020-05-19 DIAGNOSIS — Z85820 Personal history of malignant melanoma of skin: Secondary | ICD-10-CM | POA: Diagnosis not present

## 2020-05-19 DIAGNOSIS — G629 Polyneuropathy, unspecified: Secondary | ICD-10-CM | POA: Diagnosis present

## 2020-05-19 DIAGNOSIS — Y92009 Unspecified place in unspecified non-institutional (private) residence as the place of occurrence of the external cause: Secondary | ICD-10-CM | POA: Diagnosis not present

## 2020-05-19 DIAGNOSIS — I739 Peripheral vascular disease, unspecified: Secondary | ICD-10-CM | POA: Diagnosis present

## 2020-05-19 DIAGNOSIS — R296 Repeated falls: Secondary | ICD-10-CM | POA: Diagnosis present

## 2020-05-19 DIAGNOSIS — R338 Other retention of urine: Secondary | ICD-10-CM | POA: Diagnosis not present

## 2020-05-19 DIAGNOSIS — S7010XA Contusion of unspecified thigh, initial encounter: Secondary | ICD-10-CM | POA: Diagnosis present

## 2020-05-19 DIAGNOSIS — S52502D Unspecified fracture of the lower end of left radius, subsequent encounter for closed fracture with routine healing: Secondary | ICD-10-CM | POA: Diagnosis not present

## 2020-05-19 DIAGNOSIS — I4819 Other persistent atrial fibrillation: Secondary | ICD-10-CM | POA: Diagnosis present

## 2020-05-19 DIAGNOSIS — Z885 Allergy status to narcotic agent status: Secondary | ICD-10-CM | POA: Diagnosis not present

## 2020-05-19 DIAGNOSIS — T148XXA Other injury of unspecified body region, initial encounter: Secondary | ICD-10-CM

## 2020-05-19 DIAGNOSIS — D6869 Other thrombophilia: Secondary | ICD-10-CM

## 2020-05-19 DIAGNOSIS — Z9049 Acquired absence of other specified parts of digestive tract: Secondary | ICD-10-CM | POA: Diagnosis not present

## 2020-05-19 DIAGNOSIS — S7012XA Contusion of left thigh, initial encounter: Secondary | ICD-10-CM | POA: Diagnosis not present

## 2020-05-19 DIAGNOSIS — D62 Acute posthemorrhagic anemia: Secondary | ICD-10-CM | POA: Diagnosis present

## 2020-05-19 DIAGNOSIS — R339 Retention of urine, unspecified: Secondary | ICD-10-CM | POA: Diagnosis present

## 2020-05-19 DIAGNOSIS — D72829 Elevated white blood cell count, unspecified: Secondary | ICD-10-CM | POA: Diagnosis present

## 2020-05-19 DIAGNOSIS — I482 Chronic atrial fibrillation, unspecified: Secondary | ICD-10-CM | POA: Diagnosis present

## 2020-05-19 DIAGNOSIS — S7002XA Contusion of left hip, initial encounter: Secondary | ICD-10-CM | POA: Diagnosis present

## 2020-05-19 DIAGNOSIS — Z86718 Personal history of other venous thrombosis and embolism: Secondary | ICD-10-CM | POA: Diagnosis not present

## 2020-05-19 DIAGNOSIS — Z7952 Long term (current) use of systemic steroids: Secondary | ICD-10-CM | POA: Diagnosis not present

## 2020-05-19 DIAGNOSIS — M353 Polymyalgia rheumatica: Secondary | ICD-10-CM | POA: Diagnosis present

## 2020-05-19 DIAGNOSIS — W19XXXA Unspecified fall, initial encounter: Secondary | ICD-10-CM | POA: Diagnosis present

## 2020-05-19 DIAGNOSIS — Z20822 Contact with and (suspected) exposure to covid-19: Secondary | ICD-10-CM | POA: Diagnosis present

## 2020-05-19 DIAGNOSIS — Z888 Allergy status to other drugs, medicaments and biological substances status: Secondary | ICD-10-CM | POA: Diagnosis not present

## 2020-05-19 DIAGNOSIS — K219 Gastro-esophageal reflux disease without esophagitis: Secondary | ICD-10-CM | POA: Diagnosis present

## 2020-05-19 DIAGNOSIS — S52502A Unspecified fracture of the lower end of left radius, initial encounter for closed fracture: Secondary | ICD-10-CM | POA: Diagnosis present

## 2020-05-19 DIAGNOSIS — K5780 Diverticulitis of intestine, part unspecified, with perforation and abscess without bleeding: Secondary | ICD-10-CM | POA: Diagnosis present

## 2020-05-19 DIAGNOSIS — R7989 Other specified abnormal findings of blood chemistry: Secondary | ICD-10-CM | POA: Diagnosis present

## 2020-05-19 DIAGNOSIS — E039 Hypothyroidism, unspecified: Secondary | ICD-10-CM | POA: Diagnosis present

## 2020-05-19 LAB — CBC WITH DIFFERENTIAL/PLATELET
Abs Immature Granulocytes: 0.27 10*3/uL — ABNORMAL HIGH (ref 0.00–0.07)
Basophils Absolute: 0 10*3/uL (ref 0.0–0.1)
Basophils Relative: 0 %
Eosinophils Absolute: 0.1 10*3/uL (ref 0.0–0.5)
Eosinophils Relative: 1 %
HCT: 27.4 % — ABNORMAL LOW (ref 36.0–46.0)
Hemoglobin: 8.6 g/dL — ABNORMAL LOW (ref 12.0–15.0)
Immature Granulocytes: 2 %
Lymphocytes Relative: 12 %
Lymphs Abs: 1.9 10*3/uL (ref 0.7–4.0)
MCH: 30.2 pg (ref 26.0–34.0)
MCHC: 31.4 g/dL (ref 30.0–36.0)
MCV: 96.1 fL (ref 80.0–100.0)
Monocytes Absolute: 1.2 10*3/uL — ABNORMAL HIGH (ref 0.1–1.0)
Monocytes Relative: 8 %
Neutro Abs: 12.2 10*3/uL — ABNORMAL HIGH (ref 1.7–7.7)
Neutrophils Relative %: 77 %
Platelets: 360 10*3/uL (ref 150–400)
RBC: 2.85 MIL/uL — ABNORMAL LOW (ref 3.87–5.11)
RDW: 13.8 % (ref 11.5–15.5)
WBC: 15.8 10*3/uL — ABNORMAL HIGH (ref 4.0–10.5)
nRBC: 0 % (ref 0.0–0.2)

## 2020-05-19 LAB — COMPREHENSIVE METABOLIC PANEL
ALT: 24 U/L (ref 0–44)
AST: 18 U/L (ref 15–41)
Albumin: 2.9 g/dL — ABNORMAL LOW (ref 3.5–5.0)
Alkaline Phosphatase: 80 U/L (ref 38–126)
Anion gap: 9 (ref 5–15)
BUN: 20 mg/dL (ref 8–23)
CO2: 26 mmol/L (ref 22–32)
Calcium: 8.7 mg/dL — ABNORMAL LOW (ref 8.9–10.3)
Chloride: 104 mmol/L (ref 98–111)
Creatinine, Ser: 0.94 mg/dL (ref 0.44–1.00)
GFR calc Af Amer: 60 mL/min — ABNORMAL LOW (ref >60)
GFR calc non Af Amer: 52 mL/min — ABNORMAL LOW (ref >60)
Glucose, Bld: 143 mg/dL — ABNORMAL HIGH (ref 70–99)
Potassium: 4.7 mmol/L (ref 3.5–5.1)
Sodium: 139 mmol/L (ref 135–145)
Total Bilirubin: 1.1 mg/dL (ref 0.3–1.2)
Total Protein: 5.6 g/dL — ABNORMAL LOW (ref 6.5–8.1)

## 2020-05-19 LAB — TYPE AND SCREEN
ABO/RH(D): O POS
Antibody Screen: NEGATIVE

## 2020-05-19 LAB — SARS CORONAVIRUS 2 BY RT PCR (HOSPITAL ORDER, PERFORMED IN ~~LOC~~ HOSPITAL LAB): SARS Coronavirus 2: NEGATIVE

## 2020-05-19 LAB — HEMOGLOBIN AND HEMATOCRIT, BLOOD
HCT: 24.6 % — ABNORMAL LOW (ref 36.0–46.0)
Hemoglobin: 7.6 g/dL — ABNORMAL LOW (ref 12.0–15.0)

## 2020-05-19 LAB — PROTIME-INR
INR: 1.3 — ABNORMAL HIGH (ref 0.8–1.2)
Prothrombin Time: 15.9 seconds — ABNORMAL HIGH (ref 11.4–15.2)

## 2020-05-19 MED ORDER — GABAPENTIN 300 MG PO CAPS
300.0000 mg | ORAL_CAPSULE | Freq: Every day | ORAL | Status: DC
  Administered 2020-05-19 – 2020-05-23 (×5): 300 mg via ORAL
  Filled 2020-05-19 (×5): qty 1

## 2020-05-19 MED ORDER — ONDANSETRON HCL 4 MG/2ML IJ SOLN: 4.0000 mg | Freq: Four times a day (QID) | INTRAMUSCULAR | Status: DC | PRN

## 2020-05-19 MED ORDER — PREDNISONE 1 MG PO TABS: 4.0000 mg | ORAL_TABLET | Freq: Every day | ORAL | Status: DC

## 2020-05-19 MED ORDER — DILTIAZEM HCL ER COATED BEADS 180 MG PO CP24
180.0000 mg | ORAL_CAPSULE | Freq: Every day | ORAL | Status: DC
  Administered 2020-05-19 – 2020-05-22 (×4): 180 mg via ORAL
  Filled 2020-05-19 (×4): qty 1

## 2020-05-19 MED ORDER — LEVOTHYROXINE SODIUM 50 MCG PO TABS
50.0000 ug | ORAL_TABLET | Freq: Every day | ORAL | Status: DC
  Administered 2020-05-19 – 2020-05-24 (×6): 50 ug via ORAL
  Filled 2020-05-19 (×6): qty 1

## 2020-05-19 MED ORDER — SODIUM CHLORIDE (PF) 0.9 % IJ SOLN
INTRAMUSCULAR | Status: AC
  Filled 2020-05-19: qty 50

## 2020-05-19 MED ORDER — ACETAMINOPHEN 650 MG RE SUPP: 650.0000 mg | Freq: Four times a day (QID) | RECTAL | Status: DC | PRN

## 2020-05-19 MED ORDER — NADOLOL 20 MG PO TABS
80.0000 mg | ORAL_TABLET | Freq: Every day | ORAL | Status: DC
  Administered 2020-05-19 – 2020-05-24 (×6): 80 mg via ORAL
  Filled 2020-05-19 (×2): qty 2
  Filled 2020-05-19 (×5): qty 4
  Filled 2020-05-19: qty 2

## 2020-05-19 MED ORDER — FENTANYL CITRATE (PF) 100 MCG/2ML IJ SOLN
25.0000 ug | Freq: Once | INTRAMUSCULAR | Status: AC
  Administered 2020-05-19: 25 ug via INTRAVENOUS
  Filled 2020-05-19: qty 2

## 2020-05-19 MED ORDER — HYDROCODONE-ACETAMINOPHEN 5-325 MG PO TABS
1.0000 | ORAL_TABLET | Freq: Four times a day (QID) | ORAL | Status: DC | PRN
  Administered 2020-05-19: 2 via ORAL
  Administered 2020-05-19 – 2020-05-23 (×6): 1 via ORAL
  Filled 2020-05-19 (×3): qty 1
  Filled 2020-05-19: qty 2
  Filled 2020-05-19 (×3): qty 1

## 2020-05-19 MED ORDER — CYCLOBENZAPRINE HCL 10 MG PO TABS
5.0000 mg | ORAL_TABLET | Freq: Three times a day (TID) | ORAL | Status: DC | PRN
  Administered 2020-05-20: 5 mg via ORAL
  Administered 2020-05-21 – 2020-05-22 (×2): 10 mg via ORAL
  Filled 2020-05-19 (×4): qty 1

## 2020-05-19 MED ORDER — PRESERVISION AREDS 2 PO CAPS: 1.0000 | ORAL_CAPSULE | Freq: Every day | ORAL | Status: DC

## 2020-05-19 MED ORDER — ONDANSETRON HCL 4 MG PO TABS: 4.0000 mg | ORAL_TABLET | Freq: Four times a day (QID) | ORAL | Status: DC | PRN

## 2020-05-19 MED ORDER — ACETAMINOPHEN 325 MG PO TABS
650.0000 mg | ORAL_TABLET | Freq: Four times a day (QID) | ORAL | Status: DC | PRN
  Administered 2020-05-20 – 2020-05-22 (×5): 650 mg via ORAL
  Filled 2020-05-19 (×5): qty 2

## 2020-05-19 MED ORDER — PREDNISONE 5 MG PO TABS
9.0000 mg | ORAL_TABLET | Freq: Every day | ORAL | Status: DC
  Administered 2020-05-19 – 2020-05-23 (×5): 9 mg via ORAL
  Filled 2020-05-19 (×6): qty 4

## 2020-05-19 MED ORDER — IOHEXOL 300 MG/ML  SOLN
100.0000 mL | Freq: Once | INTRAMUSCULAR | Status: AC | PRN
  Administered 2020-05-19: 100 mL via INTRAVENOUS

## 2020-05-19 MED ORDER — DOCUSATE SODIUM 100 MG PO CAPS
100.0000 mg | ORAL_CAPSULE | Freq: Two times a day (BID) | ORAL | Status: DC
  Administered 2020-05-19 – 2020-05-24 (×11): 100 mg via ORAL
  Filled 2020-05-19 (×11): qty 1

## 2020-05-19 MED ORDER — PROSIGHT PO TABS
1.0000 | ORAL_TABLET | Freq: Every day | ORAL | Status: DC
  Administered 2020-05-19 – 2020-05-23 (×5): 1 via ORAL
  Filled 2020-05-19 (×5): qty 1

## 2020-05-19 MED ORDER — PREDNISONE 5 MG PO TABS: 5.0000 mg | ORAL_TABLET | Freq: Every day | ORAL | Status: DC

## 2020-05-19 MED ORDER — SENNOSIDES-DOCUSATE SODIUM 8.6-50 MG PO TABS
1.0000 | ORAL_TABLET | Freq: Every day | ORAL | Status: DC
  Administered 2020-05-19 – 2020-05-23 (×5): 1 via ORAL
  Filled 2020-05-19 (×5): qty 1

## 2020-05-19 MED ORDER — PANTOPRAZOLE SODIUM 40 MG PO TBEC
40.0000 mg | DELAYED_RELEASE_TABLET | Freq: Every day | ORAL | Status: DC
  Administered 2020-05-19 – 2020-05-24 (×6): 40 mg via ORAL
  Filled 2020-05-19 (×6): qty 1

## 2020-05-19 NOTE — H&P (Addendum)
History and Physical    [name] KTG:[id number] DOB: 05-10-1923 DOA: 05/19/2020  PCP: [name], MD  Patient coming from: Home  I have personally briefly reviewed patient's old medical records in [place]  Chief Complaint: L hip pain  HPI: [name] is a 86 y.o. female with medical history significant of A.Fib and prior DVT, HTN, hypothyroidism, on chronic steroids for PMR.  Pt on eliquis.  Pt had mechanical fall onto L hip x6 days ago on 7/3 (following a hospital stay from 7/1-7/3 for PNA, see discharge summary for details).  Following fall she had CT scan performed on 7/4 of pelvis: no L hip fracture.  She has been in wheelchair since time of fall.  Had been doing okay, although her bruising on L hip, L leg, and buttock had been progressively worsening each day she says.  Notes that her nurse-aid at home had been mentioning it each day during bathing.  Today developed onset of 10/10 pain in that hip.  No additional traumatic injury, no fall, etc.  Presents to ED.   ED Course: HGB now 8.6 down from 11.9 on 7/2.  CT of L hip reveals large areas of IM hematomas.  No active extravasation of contrast (per EDP discussion with radiologist).  INR 1.3 (she has been on eliquis throughout this time).  Hospitalist asked to admit.  No change in ostomy output.   Review of Systems: As per HPI, otherwise all review of systems negative.  Past Medical History:  Diagnosis Date  . DVT (deep venous thrombosis) ([place]) 09/2015   RLE  . GERD (gastroesophageal reflux disease)   . Hypertension   . Hypothyroidism   . Melanoma ([place]) 06/16/2017   Facial melanoma, removed by Dr. Harvel Quale  . Peripheral vascular disease ([place])    peripheral neuropathy    Past Surgical History:  Procedure Laterality Date  . ABDOMINAL HYSTERECTOMY    . APPENDECTOMY    . BACK SURGERY    . BREAST SURGERY     left breast biopsy  . CARDIOVERSION N/A 05/09/2020   Procedure: CARDIOVERSION;   Surgeon: Skeet Latch, MD;  Location: [facility];  Service: Cardiovascular;  Laterality: N/A;  . COLOSTOMY N/A 01/06/2020   Procedure: Colostomy;  Surgeon: Greer Pickerel, MD;  Location: [facility];  Service: General;  Laterality: N/A;  . EYE SURGERY     cataract  . HARDWARE REMOVAL Left 01/01/2020   Procedure: HARDWARE REMOVAL;  Surgeon: Iran Planas, MD;  Location: [facility];  Service: Orthopedics;  Laterality: Left;  . LAPAROTOMY N/A 01/06/2020   Procedure: Exploratory Laparotomy, Open Sigmoid colectomy;  Surgeon: Greer Pickerel, MD;  Location: [facility];  Service: General;  Laterality: N/A;  . LIPOMA EXCISION Left 04/25/2015   Procedure: EXCISION OF LIPOMA LEFT ARM;  Surgeon: Donnie Mesa, MD;  Location: [facility];  Service: General;  Laterality: Left;  . LUMBAR LAMINECTOMY/DECOMPRESSION MICRODISCECTOMY  11/20/2011   Procedure: LUMBAR LAMINECTOMY/DECOMPRESSION MICRODISCECTOMY;  Surgeon: Jeneen Rinks P [place];  Location: [facility];  Service: Orthopedics;  Laterality: N/A;  Decompressive Laminectomy L2 to the Sacrum (X-Ray)  . OPEN REDUCTION INTERNAL FIXATION (ORIF) DISTAL RADIAL FRACTURE Left 01/01/2020   Procedure: OPEN REDUCTION INTERNAL FIXATION (ORIF) DISTAL RADIAL FRACTURE;  Surgeon: Iran Planas, MD;  Location: [facility];  Service: Orthopedics;  Laterality: Left;  . OTHER SURGICAL HISTORY     left wrist surgery - has plate in left wrist   . OTHER SURGICAL HISTORY     right knee surgery  due to torn cartilage   . TONSILLECTOMY    . WRIST FRACTURE SURGERY Left      reports that she has never smoked. She has never used smokeless tobacco. She reports that she does not drink alcohol and does not use drugs.  Allergies  Allergen Reactions  . Codeine Other (See Comments)    Nightmares, imagining things  . Keflex [Cephalexin] Nausea Only    Pt ended up in ER w/ CP    Family History  Problem Relation Age of Onset  . Cancer Mother        BREAST  . COPD Father   . Emphysema Father   .  Cancer Daughter        breast     Prior to Admission medications   Medication Sig Start Date End Date Taking? Authorizing Provider  acetaminophen (TYLENOL) 325 MG tablet Take 1-2 tablets (325-650 mg total) by mouth every 4 (four) hours as needed for mild pain. 01/19/20  Yes Love, Ivan Anchors, PA-C  cyclobenzaprine (FLEXERIL) 5 MG tablet Take 1-2 tablets (5-10 mg total) by mouth 3 (three) times daily as needed for muscle spasms. 05/14/20  Yes Gareth Morgan, MD  diltiazem (DILACOR XR) 180 MG 24 hr capsule Take 1 capsule (180 mg total) by mouth daily. 03/06/20  Yes [name], MD  docusate sodium (COLACE) 100 MG capsule Take 1 capsule (100 mg total) by mouth 2 (two) times daily. 01/20/20  Yes Love, Pamela S, PA-C  ELIQUIS 5 MG TABS tablet TAKE 1 TABLET BY MOUTH TWICE A DAY Patient taking differently: Take 5 mg by mouth 2 (two) times daily.  02/11/20  Yes Raulkar, Clide Deutscher, MD  gabapentin (NEURONTIN) 300 MG capsule Take 300 mg by mouth at bedtime.   Yes [provider]  levothyroxine (SYNTHROID) 50 MCG tablet TAKE 1 TABLET BY MOUTH EVERY DAY Patient taking differently: Take 50 mcg by mouth daily before breakfast.  02/08/20  Yes [name], MD  Multiple Vitamins-Minerals (PRESERVISION AREDS 2) CAPS Take 1 capsule by mouth daily with lunch.    Yes [provider]  nadolol (CORGARD) 80 MG tablet Take 1 tablet (80 mg total) by mouth daily. 05/03/20  Yes Deboraha Sprang, MD  ondansetron (ZOFRAN) 4 MG tablet Take 1 tablet (4 mg total) by mouth every 8 (eight) hours as needed for nausea or vomiting. 02/01/20  Yes Raulkar, Clide Deutscher, MD  pantoprazole (PROTONIX) 40 MG tablet Take 1 tablet (40 mg total) by mouth daily. 02/15/20  Yes [name], MD  predniSONE (DELTASONE) 1 MG tablet Take 4 mg by mouth daily. Take with the prednisone 5mg  for a total of 9mg  05/10/20  Yes [provider]  predniSONE (DELTASONE) 5 MG tablet Take 5 mg by mouth daily with lunch. Take with  prednisone 1 mg x 4 for a total of 9mg    Yes [provider]  senna-docusate (SENOKOT-S) 8.6-50 MG tablet Take 1 tablet by mouth at bedtime. 01/20/20 01/19/21 Yes LoveIvan Anchors, PA-C    Physical Exam: Vitals:   05/19/20 0005 05/19/20 0010 05/19/20 0011 05/19/20 0218  BP:  (!) 163/97  131/63  Pulse:  73  68  Resp:  17  17  Temp:  98.3 F (36.8 C)    TempSrc:  Oral    SpO2: 95% 98%  95%  Weight:   70.3 kg   Height:   5\' 6"  (1.676 m)     Constitutional: NAD, calm, comfortable Eyes: PERRL, lids and  conjunctivae normal ENMT: Mucous membranes are moist. Posterior pharynx clear of any exudate or lesions.Normal dentition.  Neck: normal, supple, no masses, no thyromegaly Respiratory: clear to auscultation bilaterally, no wheezing, no crackles. Normal respiratory effort. No accessory muscle use.  Cardiovascular: Regular rate and rhythm, no murmurs / rubs / gallops. No extremity edema. 2+ pedal pulses. No carotid bruits.  Abdomen: no tenderness, no masses palpated. No hepatosplenomegaly. Bowel sounds positive. Ostomy in place Musculoskeletal: no clubbing / cyanosis. No joint deformity upper and lower extremities. Good ROM, no contractures. Normal muscle tone.  Skin: Hematomas to L hip, thigh Neurologic: CN 2-12 grossly intact. Sensation intact, DTR normal. Strength 5/5 in all 4.  Psychiatric: Normal judgment and insight. Alert and oriented x 3. Normal mood.    Labs on Admission: I have personally reviewed following labs and imaging studies  CBC: Recent Labs  Lab 05/12/20 1028 05/19/20 0030  WBC 12.5* 15.8*  NEUTROABS 9.5* 12.2*  HGB 11.9* 8.6*  HCT 37.5 27.4*  MCV 95.7 96.1  PLT 245 161   Basic Metabolic Panel: Recent Labs  Lab 05/12/20 1028 05/19/20 0030  NA 139 139  K 4.0 4.7  CL 103 104  CO2 29 26  GLUCOSE 113* 143*  BUN 14 20  CREATININE 0.88 0.94  CALCIUM 8.8* 8.7*   GFR: Estimated Creatinine Clearance: 33.5 mL/min (by C-G formula based on SCr of 0.94  mg/dL). Liver Function Tests: Recent Labs  Lab 05/12/20 1028 05/19/20 0030  AST 16 18  ALT 20 24  ALKPHOS 85 80  BILITOT 0.8 1.1  PROT 5.7* 5.6*  ALBUMIN 2.8* 2.9*   No results for input(s): LIPASE, AMYLASE in the last 168 hours. No results for input(s): AMMONIA in the last 168 hours. Coagulation Profile: Recent Labs  Lab 05/19/20 0030  INR 1.3*   Cardiac Enzymes: No results for input(s): CKTOTAL, CKMB, CKMBINDEX, TROPONINI in the last 168 hours. BNP (last 3 results) No results for input(s): PROBNP in the last 8760 hours. HbA1C: No results for input(s): HGBA1C in the last 72 hours. CBG: No results for input(s): GLUCAP in the last 168 hours. Lipid Profile: No results for input(s): CHOL, HDL, LDLCALC, TRIG, CHOLHDL, LDLDIRECT in the last 72 hours. Thyroid Function Tests: No results for input(s): TSH, T4TOTAL, FREET4, T3FREE, THYROIDAB in the last 72 hours. Anemia Panel: No results for input(s): VITAMINB12, FOLATE, FERRITIN, TIBC, IRON, RETICCTPCT in the last 72 hours. Urine analysis:    Component Value Date/Time   COLORURINE YELLOW 01/05/2020 0621   APPEARANCEUR HAZY (A) 01/05/2020 0621   LABSPEC 1.014 01/05/2020 0621   PHURINE 6.0 01/05/2020 0621   GLUCOSEU NEGATIVE 01/05/2020 0621   GLUCOSEU NEGATIVE 02/22/2016 0926   HGBUR NEGATIVE 01/05/2020 0621   BILIRUBINUR Negative 02/03/2020 1548   KETONESUR NEGATIVE 01/05/2020 0621   PROTEINUR Negative 02/03/2020 1548   PROTEINUR NEGATIVE 01/05/2020 0621   UROBILINOGEN 0.2 02/03/2020 1548   UROBILINOGEN 0.2 02/22/2016 0926   NITRITE Negative 02/03/2020 1548   NITRITE NEGATIVE 01/05/2020 0621   LEUKOCYTESUR Negative 02/03/2020 1548   LEUKOCYTESUR NEGATIVE 01/05/2020 0621    Radiological Exams on Admission: CT HIP LEFT W CONTRAST  Addendum Date: 05/19/2020   ADDENDUM REPORT: 05/19/2020 03:50 ADDENDUM: No sites of active contrast extravasation identified within the intramuscular hemorrhage. However the mixed attenuation  of these collections is suggestive of mixed age blood products including acute on more subacute hemorrhage. Can reflect sequela complex high-grade tearing, though underlying musculotendinous integrity is difficult to fully assess on CT and in the  setting of such extensive hemorrhages. Initial findings and addendum were discussed via telephone on 05/19/2020 at 3:49 am to provider [facility] , who verbally acknowledged these results. Electronically Signed   By: Lovena Le M.D.   On: 05/19/2020 03:50   Result Date: 05/19/2020 CLINICAL DATA:  Soft tissue mass, swelling EXAM: CT OF THE LOWER LEFT EXTREMITY WITH CONTRAST TECHNIQUE: Multidetector CT imaging of the lower left extremity was performed according to the standard protocol following intravenous contrast administration. COMPARISON:  Same-day radiograph, radiograph and CT pelvis 05/14/2020, CT abdomen and pelvis 01/11/2020 CONTRAST:  171mL OMNIPAQUE IOHEXOL 300 MG/ML  SOLN FINDINGS: Bones/Joint/Cartilage The osseous structures appear diffusely demineralized which may limit detection of small or nondisplaced fractures. Included portions of the bony pelvis are intact and congruent. The proximal femur is intact and normally located. Degenerative change of the left SI joint and symphysis pubis. Moderate degenerative features of the left hip with periacetabular spurring. Additional enthesopathic spurring noted upon the iliac crest, ischial tuberosities and greater trochanter of the left femur. Enthesopathic mineralization within the soft tissue seen along the anterior left hip, unchanged from prior. No evidence of acute avulsion. Benign bone island in the supra-acetabular femoral neck. Enthesopathic changes No sizable left hip effusion. Ligaments Suboptimally assessed by CT. Muscles and Tendons Multiple intramuscular hemorrhages throughout the left hip musculature. These involve the gluteus maximus, inferior gluteus medius, piriformis, proximal vastus lateralis,  proximal adductor compartment and quadratus femoris musculature. The extent of this hemorrhagic change results in some increasing lateral bowing of the iliotibial band with additional overlying contusive changes. Furthermore, hemorrhage along the ileus psoas does track medially to the sacral insertion site and there is increasing presacral fat stranding which is likely reactive. Soft tissues Lateral hip soft tissue swelling and contusive changes with the muscular hemorrhages, as above. Presacral fat stranding, increased from prior likely attributable to the piriformis muscle hemorrhage. There is a left lower quadrant colostomy noted. Hartmann's pouch in the pelvis with few noninflamed colonic diverticula. Postsurgical changes of the low anterior abdomen. Prior hysterectomy. Extensive atherosclerotic calcification. IMPRESSION: 1. Multiple large intramuscular hemorrhages throughout the left hip musculature, involving the gluteus maximus, inferior gluteus medius, inferior gluteus medius, proximal vastus lateralis, proximal adductor compartment and quadratus femoris musculature. The extent of this hemorrhagic change results in some increasing lateral bowing of the iliotibial band with additional overlying contusive changes. 2. Piriformis hemorrhage does track medially to the sacral insertion site and there is increasing presacral fat stranding which is likely reactive. 3. No acute fracture or traumatic malalignment of the visualized bony pelvis. 4. Moderate degenerative features of the left hip with periacetabular spurring. Electronically Signed: By: Lovena Le M.D. On: 05/19/2020 03:34   DG Hip Unilat W or Wo Pelvis 2-3 Views Left  Result Date: 05/19/2020 CLINICAL DATA:  Fall 05/14/2020, lateral hip pain radiating down leg, bruising EXAM: DG HIP (WITH OR WITHOUT PELVIS) 2-3V LEFT COMPARISON:  CT and radiograph 05/14/2020 FINDINGS: Bones of the pelvis remain intact and congruent. Proximal femora are normally  located. Vance degenerative changes throughout the lower lumbar spine, SI joints, hips and pelvis. Enthesopathic changes noted throughout the pelvis as well. There is an enlarging lateral left hip hematoma with extensive soft tissue swelling laterally likely distending the iliotibial band. IMPRESSION: Enlarging lateral left hip hematoma with extensive soft tissue swelling laterally likely distending the iliotibial band. No acute osseous abnormality. These results were called by telephone at the time of interpretation on 05/19/2020 at 1:39 am to provider [facility] ,  who verbally acknowledged these results. Electronically Signed   By: Lovena Le M.D.   On: 05/19/2020 01:39   DG FEMUR MIN 2 VIEWS LEFT  Result Date: 05/19/2020 CLINICAL DATA:  Fall with hip pain EXAM: LEFT FEMUR 2 VIEWS COMPARISON:  None. FINDINGS: There is no evidence of fracture or other focal bone lesions. Soft tissues are unremarkable. IMPRESSION: Negative. Electronically Signed   By: Ulyses Jarred M.D.   On: 05/19/2020 01:40    EKG: Independently reviewed.  Assessment/Plan Principal Problem:   Acute blood loss anemia Active Problems:   HTN (hypertension)   Closed fracture of left distal radius   Persistent atrial fibrillation (HCC)   Secondary hypercoagulable state ([place])   Traumatic hematoma of left hip   Current chronic use of systemic steroids    1. Acute blood loss anemia - 1. Due to hematomas that have been slowly expanding since injury on 7/3. 2. Hold Eliquis 3. Repeat HGB at 10am 4. Type and screen done in ED 5. EDP spoke with Dr. Onnie Graham: hold eliquis 6. Not having compartment syndrome at this point 2. A.Fib - (and resulting hypercoaguable state). 1. Tele monitor 2. Hold eliquis in setting of above bleeding 3. Cont rate control meds 3. HTN - 1. Cont home BP meds 4. PMR - 1. Cont chronic prednisone  DVT prophylaxis: SCDs Code Status: Full Family Communication: Son at bedside Disposition Plan: Home  after HGB stabilized (bleeding stopped). Consults called: EDP spoke with Dr. Onnie Graham Admission status: Place in obs    Stephanieann Popescu, [place] [facility]  How to contact the [facility] Attending or Consulting provider [place] or covering provider during after hours [place], for this patient?  1. Check the care team in [facility] and look for a) attending/consulting [facility] provider listed and b) the [facility] team listed 2. Log into [email or web address]  Amion Physician Scheduling and messaging for groups and whole hospitals  On call and physician scheduling software for group practices, residents, hospitalists and other medical providers for call, clinic, rotation and shift schedules. On[employer] is a hospital-wide system for scheduling doctors and paging doctors on call. EasyPlot is for scientific plotting and data analysis.  [email or web address]  and use [place]'s universal password to access. If you do not have the password, please contact the hospital operator.  3. Locate the [facility] provider you are looking for under [facility] and page to a number that you can be directly reached. 4. If you still have difficulty reaching the provider, please page the [facility] (Director on Call) for the Hospitalists listed on amion for assistance.  05/19/2020, 4:21 AM

## 2020-05-19 NOTE — Consult Note (Signed)
Reason for Consult:Left hip pain and hematoma Referring Physician: EDP  HPI: [name] is an 86 y.o. female who we were asked to see for increasing left hip pain with a hematoma.  She is a patient of our practice who had been following for some time.  She has had multiple falls since December and she had actually been seen by Dr. Alvan Dame in January  and had obtained an MRI scan of her left hip at which time this demonstrated a gluteus medius tear with atrophy.  She had been to formal physical therapy and had been doing relatively well.  She has had some lingering pain in the left hip and has had some other medical issues over the past several weeks to months.  She has had 2 subsequent falls within the past several weeks with radiographic and CT scan work-up demonstrating no acute fractures.  On the most recent fall she was discharged from the ER after negative work-up and went home to have significant increasing hip pain bruising and swelling.  She was brought back into the ED by the family last evening due to severe hip pain with inability to ambulate.  The family has been combining efforts trying to maintain her independence and to avoid her need for skilled care especially throughout the Covid pandemic.  They do have sitters and take turns staying with her.  She typically walks with a walker but more recently with the above-noted increases in pain has had a difficult time with any ambulation.  Past Medical History:  Diagnosis Date  . DVT (deep venous thrombosis) ([place]) 09/2015   RLE  . GERD (gastroesophageal reflux disease)   . Hypertension   . Hypothyroidism   . Melanoma ([place]) 06/16/2017   Facial melanoma, removed by Dr. Harvel Quale  . Peripheral vascular disease ([place])    peripheral neuropathy    Past Surgical History:  Procedure Laterality Date  . ABDOMINAL HYSTERECTOMY    . APPENDECTOMY    . BACK SURGERY    . BREAST SURGERY     left breast biopsy  . CARDIOVERSION N/A 05/09/2020    Procedure: CARDIOVERSION;  Surgeon: Skeet Latch, MD;  Location: [facility];  Service: Cardiovascular;  Laterality: N/A;  . COLOSTOMY N/A 01/06/2020   Procedure: Colostomy;  Surgeon: Greer Pickerel, MD;  Location: [facility];  Service: General;  Laterality: N/A;  . EYE SURGERY     cataract  . HARDWARE REMOVAL Left 01/01/2020   Procedure: HARDWARE REMOVAL;  Surgeon: Iran Planas, MD;  Location: [facility];  Service: Orthopedics;  Laterality: Left;  . LAPAROTOMY N/A 01/06/2020   Procedure: Exploratory Laparotomy, Open Sigmoid colectomy;  Surgeon: Greer Pickerel, MD;  Location: [facility];  Service: General;  Laterality: N/A;  . LIPOMA EXCISION Left 04/25/2015   Procedure: EXCISION OF LIPOMA LEFT ARM;  Surgeon: Donnie Mesa, MD;  Location: [facility];  Service: General;  Laterality: Left;  . LUMBAR LAMINECTOMY/DECOMPRESSION MICRODISCECTOMY  11/20/2011   Procedure: LUMBAR LAMINECTOMY/DECOMPRESSION MICRODISCECTOMY;  Surgeon: Jeneen Rinks P [place];  Location: [facility];  Service: Orthopedics;  Laterality: N/A;  Decompressive Laminectomy L2 to the Sacrum (X-Ray)  . OPEN REDUCTION INTERNAL FIXATION (ORIF) DISTAL RADIAL FRACTURE Left 01/01/2020   Procedure: OPEN REDUCTION INTERNAL FIXATION (ORIF) DISTAL RADIAL FRACTURE;  Surgeon: Iran Planas, MD;  Location: [facility];  Service: Orthopedics;  Laterality: Left;  . OTHER SURGICAL HISTORY     left wrist surgery - has plate in left wrist   . OTHER SURGICAL HISTORY  right knee surgery due to torn cartilage   . TONSILLECTOMY    . WRIST FRACTURE SURGERY Left     Family History  Problem Relation Age of Onset  . Cancer Mother        BREAST  . COPD Father   . Emphysema Father   . Cancer Daughter        breast    Social History:  reports that she has never smoked. She has never used smokeless tobacco. She reports that she does not drink alcohol and does not use drugs.  Allergies:  Allergies  Allergen Reactions  . Codeine Other (See Comments)    Nightmares,  imagining things  . Keflex [Cephalexin] Nausea Only    Pt ended up in ER w/ CP    Medications: I have reviewed the patient's current medications.  Results for orders placed or performed during the hospital encounter of 05/19/20 (from the past 48 hour(s))  CBC with Differential/Platelet     Status: Abnormal   Collection Time: 05/19/20 12:30 AM  Result Value Ref Range   WBC 15.8 (H) 4.0 - 10.5 K/uL   RBC 2.85 (L) 3.87 - 5.11 MIL/uL   Hemoglobin 8.6 (L) 12.0 - 15.0 g/dL   HCT 27.4 (L) 36 - 46 %   MCV 96.1 80.0 - 100.0 fL   MCH 30.2 26.0 - 34.0 pg   MCHC 31.4 30.0 - 36.0 g/dL   RDW 13.8 11.5 - 15.5 %   Platelets 360 150 - 400 K/uL   nRBC 0.0 0.0 - 0.2 %   Neutrophils Relative % 77 %   Neutro Abs 12.2 (H) 1.7 - 7.7 K/uL   Lymphocytes Relative 12 %   Lymphs Abs 1.9 0.7 - 4.0 K/uL   Monocytes Relative 8 %   Monocytes Absolute 1.2 (H) 0 - 1 K/uL   Eosinophils Relative 1 %   Eosinophils Absolute 0.1 0 - 0 K/uL   Basophils Relative 0 %   Basophils Absolute 0.0 0 - 0 K/uL   Immature Granulocytes 2 %   Abs Immature Granulocytes 0.27 (H) 0.00 - 0.07 K/uL    Comment: Performed at [facility], [place] [address], [place], [place] [address]  Comprehensive metabolic panel     Status: Abnormal   Collection Time: 05/19/20 12:30 AM  Result Value Ref Range   Sodium 139 135 - 145 mmol/L   Potassium 4.7 3.5 - 5.1 mmol/L   Chloride 104 98 - 111 mmol/L   CO2 26 22 - 32 mmol/L   Glucose, Bld 143 (H) 70 - 99 mg/dL    Comment: Glucose reference range applies only to samples taken after fasting for at least 8 hours.   BUN 20 8 - 23 mg/dL   Creatinine, Ser 0.94 0.44 - 1.00 mg/dL   Calcium 8.7 (L) 8.9 - 10.3 mg/dL   Total Protein 5.6 (L) 6.5 - 8.1 g/dL   Albumin 2.9 (L) 3.5 - 5.0 g/dL   AST 18 15 - 41 U/L   ALT 24 0 - 44 U/L   Alkaline Phosphatase 80 38 - 126 U/L   Total Bilirubin 1.1 0.3 - 1.2 mg/dL   GFR calc non Af Amer 52 (L) >60 mL/min   GFR calc Af Amer 60 (L) >60 mL/min    Anion gap 9 5 - 15    Comment: Performed at [facility], [place] [address] [place], [place] [address]  Protime-INR     Status: Abnormal   Collection Time:  05/19/20 12:30 AM  Result Value Ref Range   Prothrombin Time 15.9 (H) 11.4 - 15.2 seconds   INR 1.3 (H) 0.8 - 1.2    Comment: (NOTE) INR goal varies based on device and disease states. Performed at [facility], [place] [address] [place], [place] [address]   Type and screen [place]     Status: None   Collection Time: 05/19/20 12:30 AM  Result Value Ref Range   ABO/RH(D) O POS    Antibody Screen NEG    Sample Expiration      05/22/2020,2359 Performed at [facility], [place] [address] [place], [place] [address]   SARS Coronavirus 2 by RT PCR (hospital order, performed in [facility] hospital lab) Nasopharyngeal Nasopharyngeal Swab     Status: None   Collection Time: 05/19/20  4:53 AM   Specimen: Nasopharyngeal Swab  Result Value Ref Range   SARS Coronavirus 2 NEGATIVE NEGATIVE    Comment: (NOTE) SARS-CoV-2 target nucleic acids are NOT DETECTED.  The SARS-CoV-2 RNA is generally detectable in upper and lower respiratory specimens during the acute phase of infection. The lowest concentration of SARS-CoV-2 viral copies this assay can detect is 250 copies / mL. A negative result does not preclude SARS-CoV-2 infection and should not be used as the sole basis for treatment or other patient management decisions.  A negative result may occur with improper specimen collection / handling, submission of specimen other than nasopharyngeal swab, presence of viral mutation(s) within the areas targeted by this assay, and inadequate number of viral copies (<250 copies / mL). A negative result must be combined with clinical observations, patient history, and epidemiological information.  Fact Sheet for Patients:   StrictlyIdeas.no  Fact  Sheet for Healthcare Providers: BankingDealers.co.za  This test is not yet approved or  cleared by the [place] FDA and has been authorized for detection and/or diagnosis of SARS-CoV-2 by FDA under an Emergency Use Authorization (EUA).  This EUA will remain in effect (meaning this test can be used) for the duration of the COVID-19 declaration under Section 564(b)(1) of the Act, 21 U.S.C. section 360bbb-3(b)(1), unless the authorization is terminated or revoked sooner.  Performed at [facility], [place] [address], [place], [place] [address]     CT HIP LEFT W CONTRAST  Addendum Date: 05/19/2020   ADDENDUM REPORT: 05/19/2020 03:50 ADDENDUM: No sites of active contrast extravasation identified within the intramuscular hemorrhage. However the mixed attenuation of these collections is suggestive of mixed age blood products including acute on more subacute hemorrhage. Can reflect sequela complex high-grade tearing, though underlying musculotendinous integrity is difficult to fully assess on CT and in the setting of such extensive hemorrhages. Initial findings and addendum were discussed via telephone on 05/19/2020 at 3:49 am to provider [facility] , who verbally acknowledged these results. Electronically Signed   By: Lovena Le M.D.   On: 05/19/2020 03:50   Result Date: 05/19/2020 CLINICAL DATA:  Soft tissue mass, swelling EXAM: CT OF THE LOWER LEFT EXTREMITY WITH CONTRAST TECHNIQUE: Multidetector CT imaging of the lower left extremity was performed according to the standard protocol following intravenous contrast administration. COMPARISON:  Same-day radiograph, radiograph and CT pelvis 05/14/2020, CT abdomen and pelvis 01/11/2020 CONTRAST:  160mL OMNIPAQUE IOHEXOL 300 MG/ML  SOLN FINDINGS: Bones/Joint/Cartilage The osseous structures appear diffusely demineralized which may limit detection of small or nondisplaced fractures. Included portions of the bony  pelvis are intact and congruent. The proximal femur is  intact and normally located. Degenerative change of the left SI joint and symphysis pubis. Moderate degenerative features of the left hip with periacetabular spurring. Additional enthesopathic spurring noted upon the iliac crest, ischial tuberosities and greater trochanter of the left femur. Enthesopathic mineralization within the soft tissue seen along the anterior left hip, unchanged from prior. No evidence of acute avulsion. Benign bone island in the supra-acetabular femoral neck. Enthesopathic changes No sizable left hip effusion. Ligaments Suboptimally assessed by CT. Muscles and Tendons Multiple intramuscular hemorrhages throughout the left hip musculature. These involve the gluteus maximus, inferior gluteus medius, piriformis, proximal vastus lateralis, proximal adductor compartment and quadratus femoris musculature. The extent of this hemorrhagic change results in some increasing lateral bowing of the iliotibial band with additional overlying contusive changes. Furthermore, hemorrhage along the ileus psoas does track medially to the sacral insertion site and there is increasing presacral fat stranding which is likely reactive. Soft tissues Lateral hip soft tissue swelling and contusive changes with the muscular hemorrhages, as above. Presacral fat stranding, increased from prior likely attributable to the piriformis muscle hemorrhage. There is a left lower quadrant colostomy noted. Hartmann's pouch in the pelvis with few noninflamed colonic diverticula. Postsurgical changes of the low anterior abdomen. Prior hysterectomy. Extensive atherosclerotic calcification. IMPRESSION: 1. Multiple large intramuscular hemorrhages throughout the left hip musculature, involving the gluteus maximus, inferior gluteus medius, inferior gluteus medius, proximal vastus lateralis, proximal adductor compartment and quadratus femoris musculature. The extent of this hemorrhagic  change results in some increasing lateral bowing of the iliotibial band with additional overlying contusive changes. 2. Piriformis hemorrhage does track medially to the sacral insertion site and there is increasing presacral fat stranding which is likely reactive. 3. No acute fracture or traumatic malalignment of the visualized bony pelvis. 4. Moderate degenerative features of the left hip with periacetabular spurring. Electronically Signed: By: Lovena Le M.D. On: 05/19/2020 03:34   DG Hip Unilat W or Wo Pelvis 2-3 Views Left  Result Date: 05/19/2020 CLINICAL DATA:  Fall 05/14/2020, lateral hip pain radiating down leg, bruising EXAM: DG HIP (WITH OR WITHOUT PELVIS) 2-3V LEFT COMPARISON:  CT and radiograph 05/14/2020 FINDINGS: Bones of the pelvis remain intact and congruent. Proximal femora are normally located. Vance degenerative changes throughout the lower lumbar spine, SI joints, hips and pelvis. Enthesopathic changes noted throughout the pelvis as well. There is an enlarging lateral left hip hematoma with extensive soft tissue swelling laterally likely distending the iliotibial band. IMPRESSION: Enlarging lateral left hip hematoma with extensive soft tissue swelling laterally likely distending the iliotibial band. No acute osseous abnormality. These results were called by telephone at the time of interpretation on 05/19/2020 at 1:39 am to provider [facility] , who verbally acknowledged these results. Electronically Signed   By: Lovena Le M.D.   On: 05/19/2020 01:39   DG FEMUR MIN 2 VIEWS LEFT  Result Date: 05/19/2020 CLINICAL DATA:  Fall with hip pain EXAM: LEFT FEMUR 2 VIEWS COMPARISON:  None. FINDINGS: There is no evidence of fracture or other focal bone lesions. Soft tissues are unremarkable. IMPRESSION: Negative. Electronically Signed   By: Ulyses Jarred M.D.   On: 05/19/2020 01:40    ROS: She had a recent admission for pneumonia.  Other reviews of systems were as pertinent to history  present illness.  Physical Exam:  Ms. Tegeler is evaluated in the exam bed in the ED.  She is alert and oriented.  Her son is at bedside.  She is nontender to palpation about the  pelvic bones.  Anteriorly she has negative logroll's.  She has no groin pain.  All compartments are soft and she is neurovascularly intact distally.  She has significant swelling and bruising noted to the left buttock and posterior thigh drifting distally.  She has significant discomfort on attempted hip flexion.  There is no fluctuance.  The bruising and hematoma appears to be in various stages of resolution.  Again her compartments are all soft.  Her current clinical exam does not suggest active bleeding. Vitals Temp:  [98.3 F (36.8 C)] 98.3 F (36.8 C) (07/09 0010) Pulse Rate:  [67-77] 76 (07/09 1000) Resp:  [17-18] 17 (07/09 1000) BP: (114-163)/(51-97) 114/51 (07/09 1000) SpO2:  [94 %-98 %] 97 % (07/09 1000) Weight:  [70.3 kg] 70.3 kg (07/09 0011) Body mass index is 25.02 kg/m.  Assessment/Plan: Impression: Left hip pain with inability to ambulate with clinical exam showing diffuse hematoma and bruising. Treatment: At this time I have discussed with the patient and her son that there is no need for surgical or other  intervention other than conservative management for her above-noted injury.  This is something that will take some time to resolve.  We have suggested holdin her eloquis if this is medically acceptable until there has been some resolution but again I think based on clinical exam I do not see evidence for active bleeding All her compartments are soft.  We would recommend  gentle range of motion and As needed analgesics.  Continue to mobilize as her pain allows weight bearing as tolerated.  My bigger concern is that this is her third fall over the past several months and that there may be a need for a higher level of care.  The family is willing to consider this at this time.  We will continue to follow her  along as an outpatient.  Please call if additional needs arise.She can follow up with Dr. Alvan Dame as an outpatient  Jenetta Loges for Dr. Justice Britain 05/19/2020, 10:55 AM  Contact # [phone]

## 2020-05-19 NOTE — Progress Notes (Signed)
PROGRESS NOTE    [name]  MWN:[id number] DOB: 1923/10/15 DOA: 05/19/2020 PCP: [name], MD      Brief Narrative:  [name] is a 86 y.o. F with Afib and hx DVT on Eliquis, HTN, hypothyroidism, hx perforated diverticulitis s/p partial colectomy and ostomy, as well as PMR still on chronic steroids who presented with left hip pain and swelling.  Evidently the patient had had a muscle tear recently.  She was admitted to the hospital earlier this month for pneumonia, discharged home, where she fell.  Since then she has had left hip pain which has been getting progressively worse.  She had a CT scan on 7/4 that showed no fracture.  By the day of discharge, pain and bruising and swelling in her left hip were getting to be severe and the pain was preventing her from moving at all and was excruciating.  She came to the ER where CT showed large areas of intramuscular hematoma without active extravasation.  Hemoglobin down to 8.6 from 11.91-week ago.       Assessment & Plan:  Acute blood loss anemia Baseline hemoglobin 11 to 12 g/dL.  Hemoglobin 8 on admission, down to 7.6 today. -Trend hemoglobin tomorrow -Transfusion threshold 7 g/dL -Check iron stores    Left hip hematoma I have every 6 dictation this is a nonoperative hematoma, she remains hemodynamically stable, and we will provide supportive cares -Continue Vicodin as needed -Consult orthopedics, appreciate cares  Paroxysmal atrial fibrillation -Hold Eliquis -Continue nadolol, diltiazem  History of DVT -Hold Eliquis  Hypertension -Continue nadolol, diltiazem  Hypothyroidism -Continue levothyroxine  History of perforated diverticulitis  Fibromyalgia rheumatica -Continue prednisone -Continue pantoprazole  Polyneuropathy -Continue gabapentin        Disposition:  Status is: Inpatient  Remains inpatient appropriate because:She has dropping hemoglobin, inability to stand.  We will monitor her  hemoglobin to make sure she does not need further transfusion, and work with physical therapy to obtain a safe discharge plan.   Dispo: The patient is from: Home              Anticipated d/c is to: SNF              Anticipated d/c date is: 1 day              Patient currently is not medically stable to d/c.                  MDM: This is a no charge note.  For further details, please see H&P by my partner Dr. Alcario Drought from earlier today.  The below labs and imaging reports were reviewed and summarized above.    DVT prophylaxis: SCDs Start: 05/19/20 0349  Code Status: Full code Family Communication: Son at the bedside    Consultants:   Orthopedics  Procedures:   7/8 CT hip-left intramuscular hematomas  Antimicrobials:      Culture data:              Subjective: Patient still has pain in the left thigh, tolerable with Vicodin, severe and excruciating with any movement.  Unable to bear weight.  No dizziness, confusion, syncope.        Objective: Vitals:   05/19/20 1102 05/19/20 1130 05/19/20 1200 05/19/20 1229  BP: 130/60 135/70 131/62 121/68  Pulse: 71 73 71 72  Resp: 17 16 18 18   Temp:      TempSrc:      SpO2: 97% 93% 93% 97%  Weight:      Height:        Intake/Output Summary (Last 24 hours) at 05/19/2020 1300 Last data filed at 05/19/2020 0800 Gross per 24 hour  Intake --  Output 50 ml  Net -50 ml   Filed Weights   05/19/20 0011  Weight: 70.3 kg    Examination: The patient was seen and examined.      Data Reviewed: I have personally reviewed following labs and imaging studies:  CBC: Recent Labs  Lab 05/19/20 0030 05/19/20 1057  WBC 15.8*  --   NEUTROABS 12.2*  --   HGB 8.6* 7.6*  HCT 27.4* 24.6*  MCV 96.1  --   PLT 360  --    Basic Metabolic Panel: Recent Labs  Lab 05/19/20 0030  NA 139  K 4.7  CL 104  CO2 26  GLUCOSE 143*  BUN 20  CREATININE 0.94  CALCIUM 8.7*   GFR: Estimated Creatinine Clearance:  33.5 mL/min (by C-G formula based on SCr of 0.94 mg/dL). Liver Function Tests: Recent Labs  Lab 05/19/20 0030  AST 18  ALT 24  ALKPHOS 80  BILITOT 1.1  PROT 5.6*  ALBUMIN 2.9*   No results for input(s): LIPASE, AMYLASE in the last 168 hours. No results for input(s): AMMONIA in the last 168 hours. Coagulation Profile: Recent Labs  Lab 05/19/20 0030  INR 1.3*   Cardiac Enzymes: No results for input(s): CKTOTAL, CKMB, CKMBINDEX, TROPONINI in the last 168 hours. BNP (last 3 results) No results for input(s): PROBNP in the last 8760 hours. HbA1C: No results for input(s): HGBA1C in the last 72 hours. CBG: No results for input(s): GLUCAP in the last 168 hours. Lipid Profile: No results for input(s): CHOL, HDL, LDLCALC, TRIG, CHOLHDL, LDLDIRECT in the last 72 hours. Thyroid Function Tests: No results for input(s): TSH, T4TOTAL, FREET4, T3FREE, THYROIDAB in the last 72 hours. Anemia Panel: No results for input(s): VITAMINB12, FOLATE, FERRITIN, TIBC, IRON, RETICCTPCT in the last 72 hours. Urine analysis:    Component Value Date/Time   COLORURINE YELLOW 01/05/2020 0621   APPEARANCEUR HAZY (A) 01/05/2020 0621   LABSPEC 1.014 01/05/2020 0621   PHURINE 6.0 01/05/2020 0621   GLUCOSEU NEGATIVE 01/05/2020 0621   GLUCOSEU NEGATIVE 02/22/2016 0926   HGBUR NEGATIVE 01/05/2020 0621   BILIRUBINUR Negative 02/03/2020 1548   KETONESUR NEGATIVE 01/05/2020 0621   PROTEINUR Negative 02/03/2020 1548   PROTEINUR NEGATIVE 01/05/2020 0621   UROBILINOGEN 0.2 02/03/2020 1548   UROBILINOGEN 0.2 02/22/2016 0926   NITRITE Negative 02/03/2020 1548   NITRITE NEGATIVE 01/05/2020 0621   LEUKOCYTESUR Negative 02/03/2020 1548   LEUKOCYTESUR NEGATIVE 01/05/2020 0621   Sepsis Labs: @LABRCNTIP (procalcitonin:4,lacticacidven:4)  ) Recent Results (from the past 240 hour(s))  SARS Coronavirus 2 by RT PCR (hospital order, performed in [facility] hospital lab) Nasopharyngeal Nasopharyngeal Swab     Status:  None   Collection Time: 05/11/20  3:00 PM   Specimen: Nasopharyngeal Swab  Result Value Ref Range Status   SARS Coronavirus 2 NEGATIVE NEGATIVE Final    Comment: (NOTE) SARS-CoV-2 target nucleic acids are NOT DETECTED.  The SARS-CoV-2 RNA is generally detectable in upper and lower respiratory specimens during the acute phase of infection. The lowest concentration of SARS-CoV-2 viral copies this assay can detect is 250 copies / mL. A negative result does not preclude SARS-CoV-2 infection and should not be used as the sole basis for treatment or other patient management decisions.  A negative result may occur with improper specimen collection /  handling, submission of specimen other than nasopharyngeal swab, presence of viral mutation(s) within the areas targeted by this assay, and inadequate number of viral copies (<250 copies / mL). A negative result must be combined with clinical observations, patient history, and epidemiological information.  Fact Sheet for Patients:   StrictlyIdeas.no  Fact Sheet for Healthcare Providers: BankingDealers.co.za  This test is not yet approved or  cleared by the [place] FDA and has been authorized for detection and/or diagnosis of SARS-CoV-2 by FDA under an Emergency Use Authorization (EUA).  This EUA will remain in effect (meaning this test can be used) for the duration of the COVID-19 declaration under Section 564(b)(1) of the Act, 21 U.S.C. section 360bbb-3(b)(1), unless the authorization is terminated or revoked sooner.  Performed at [facility], [place] [address], [place], [place] [address]   SARS Coronavirus 2 by RT PCR (hospital order, performed in [facility] hospital lab) Nasopharyngeal Nasopharyngeal Swab     Status: None   Collection Time: 05/19/20  4:53 AM   Specimen: Nasopharyngeal Swab  Result Value Ref Range Status   SARS Coronavirus 2 NEGATIVE NEGATIVE Final    Comment:  (NOTE) SARS-CoV-2 target nucleic acids are NOT DETECTED.  The SARS-CoV-2 RNA is generally detectable in upper and lower respiratory specimens during the acute phase of infection. The lowest concentration of SARS-CoV-2 viral copies this assay can detect is 250 copies / mL. A negative result does not preclude SARS-CoV-2 infection and should not be used as the sole basis for treatment or other patient management decisions.  A negative result may occur with improper specimen collection / handling, submission of specimen other than nasopharyngeal swab, presence of viral mutation(s) within the areas targeted by this assay, and inadequate number of viral copies (<250 copies / mL). A negative result must be combined with clinical observations, patient history, and epidemiological information.  Fact Sheet for Patients:   StrictlyIdeas.no  Fact Sheet for Healthcare Providers: BankingDealers.co.za  This test is not yet approved or  cleared by the [place] FDA and has been authorized for detection and/or diagnosis of SARS-CoV-2 by FDA under an Emergency Use Authorization (EUA).  This EUA will remain in effect (meaning this test can be used) for the duration of the COVID-19 declaration under Section 564(b)(1) of the Act, 21 U.S.C. section 360bbb-3(b)(1), unless the authorization is terminated or revoked sooner.  Performed at [facility] [place] [address], [place], [place] [address]          Radiology Studies: CT HIP LEFT W CONTRAST  Addendum Date: 05/19/2020   ADDENDUM REPORT: 05/19/2020 03:50 ADDENDUM: No sites of active contrast extravasation identified within the intramuscular hemorrhage. However the mixed attenuation of these collections is suggestive of mixed age blood products including acute on more subacute hemorrhage. Can reflect sequela complex high-grade tearing, though underlying musculotendinous integrity is  difficult to fully assess on CT and in the setting of such extensive hemorrhages. Initial findings and addendum were discussed via telephone on 05/19/2020 at 3:49 am to provider [facility] , who verbally acknowledged these results. Electronically Signed   By: Lovena Le M.D.   On: 05/19/2020 03:50   Result Date: 05/19/2020 CLINICAL DATA:  Soft tissue mass, swelling EXAM: CT OF THE LOWER LEFT EXTREMITY WITH CONTRAST TECHNIQUE: Multidetector CT imaging of the lower left extremity was performed according to the standard protocol following intravenous contrast administration. COMPARISON:  Same-day radiograph, radiograph and CT pelvis 05/14/2020, CT abdomen and pelvis 01/11/2020 CONTRAST:  138mL  OMNIPAQUE IOHEXOL 300 MG/ML  SOLN FINDINGS: Bones/Joint/Cartilage The osseous structures appear diffusely demineralized which may limit detection of small or nondisplaced fractures. Included portions of the bony pelvis are intact and congruent. The proximal femur is intact and normally located. Degenerative change of the left SI joint and symphysis pubis. Moderate degenerative features of the left hip with periacetabular spurring. Additional enthesopathic spurring noted upon the iliac crest, ischial tuberosities and greater trochanter of the left femur. Enthesopathic mineralization within the soft tissue seen along the anterior left hip, unchanged from prior. No evidence of acute avulsion. Benign bone island in the supra-acetabular femoral neck. Enthesopathic changes No sizable left hip effusion. Ligaments Suboptimally assessed by CT. Muscles and Tendons Multiple intramuscular hemorrhages throughout the left hip musculature. These involve the gluteus maximus, inferior gluteus medius, piriformis, proximal vastus lateralis, proximal adductor compartment and quadratus femoris musculature. The extent of this hemorrhagic change results in some increasing lateral bowing of the iliotibial band with additional overlying contusive  changes. Furthermore, hemorrhage along the ileus psoas does track medially to the sacral insertion site and there is increasing presacral fat stranding which is likely reactive. Soft tissues Lateral hip soft tissue swelling and contusive changes with the muscular hemorrhages, as above. Presacral fat stranding, increased from prior likely attributable to the piriformis muscle hemorrhage. There is a left lower quadrant colostomy noted. Hartmann's pouch in the pelvis with few noninflamed colonic diverticula. Postsurgical changes of the low anterior abdomen. Prior hysterectomy. Extensive atherosclerotic calcification. IMPRESSION: 1. Multiple large intramuscular hemorrhages throughout the left hip musculature, involving the gluteus maximus, inferior gluteus medius, inferior gluteus medius, proximal vastus lateralis, proximal adductor compartment and quadratus femoris musculature. The extent of this hemorrhagic change results in some increasing lateral bowing of the iliotibial band with additional overlying contusive changes. 2. Piriformis hemorrhage does track medially to the sacral insertion site and there is increasing presacral fat stranding which is likely reactive. 3. No acute fracture or traumatic malalignment of the visualized bony pelvis. 4. Moderate degenerative features of the left hip with periacetabular spurring. Electronically Signed: By: Lovena Le M.D. On: 05/19/2020 03:34   DG Hip Unilat W or Wo Pelvis 2-3 Views Left  Result Date: 05/19/2020 CLINICAL DATA:  Fall 05/14/2020, lateral hip pain radiating down leg, bruising EXAM: DG HIP (WITH OR WITHOUT PELVIS) 2-3V LEFT COMPARISON:  CT and radiograph 05/14/2020 FINDINGS: Bones of the pelvis remain intact and congruent. Proximal femora are normally located. Vance degenerative changes throughout the lower lumbar spine, SI joints, hips and pelvis. Enthesopathic changes noted throughout the pelvis as well. There is an enlarging lateral left hip hematoma with  extensive soft tissue swelling laterally likely distending the iliotibial band. IMPRESSION: Enlarging lateral left hip hematoma with extensive soft tissue swelling laterally likely distending the iliotibial band. No acute osseous abnormality. These results were called by telephone at the time of interpretation on 05/19/2020 at 1:39 am to provider [facility] , who verbally acknowledged these results. Electronically Signed   By: Lovena Le M.D.   On: 05/19/2020 01:39   DG FEMUR MIN 2 VIEWS LEFT  Result Date: 05/19/2020 CLINICAL DATA:  Fall with hip pain EXAM: LEFT FEMUR 2 VIEWS COMPARISON:  None. FINDINGS: There is no evidence of fracture or other focal bone lesions. Soft tissues are unremarkable. IMPRESSION: Negative. Electronically Signed   By: Ulyses Jarred M.D.   On: 05/19/2020 01:40        Scheduled Meds: . diltiazem  180 mg Oral Daily  . docusate sodium  100 mg Oral BID  . gabapentin  300 mg Oral QHS  . levothyroxine  50 mcg Oral Q0600  . multivitamin  1 tablet Oral Q lunch  . nadolol  80 mg Oral Daily  . pantoprazole  40 mg Oral Daily  . predniSONE  9 mg Oral Q lunch  . senna-docusate  1 tablet Oral QHS  . sodium chloride (PF)       Continuous Infusions:   LOS: 0 days    Time spent: 15 minutes    Edwin Dada, MD [facility] 05/19/2020, 1:00 PM     Please page though [place] or Epic secure chat:  For password, contact charge nurse

## 2020-05-19 NOTE — ED Notes (Signed)
Cardiologist at bedside.  

## 2020-05-19 NOTE — Consult Note (Addendum)
[facility] Nurse ostomy consult note Consult requested for ostomy supply assistance.  Pt had colostomy surgery 01/06/20 and states she is able to open and empty without assistance prior to admission. Her daughter performs pouch changes.  Stoma type/location: Stoma is red and viable,1 inch, slightly above skin level. She denies any problems with pouching activities.  Peristomal assessment: intact skin surrounding Output: 50cc semiformed stool Ostomy pouching: 1pc.  Education provided: Applied barrier ring and one piece pouch.  2 extra sets of supplies left at the bedside for staff nurse use.  Please re-consult if further assistance is needed.  Thank-you,  Julien Girt MSN, [place], [place], [place], [place]

## 2020-05-19 NOTE — ED Notes (Signed)
Pt given breakfast tray

## 2020-05-19 NOTE — ED Triage Notes (Signed)
Per EMS, Pt had a fall Saturday on her left leg and was seen on the 4th. Today pts left leg began hurting again with 10/10 pain. Pt received 150 mcg fentanyl, and 8mg  of zofran from EMS. No deformity noted, and no other mechanical injuries today.

## 2020-05-19 NOTE — ED Provider Notes (Signed)
[facility] DEPT Provider Note   CSN: [id number] Arrival date & time: 05/18/20  2359     History Chief Complaint  Patient presents with  . Leg Pain    VILLA BURGIN is a 86 y.o. female.  Patient returns with severe pain in her left leg.  She was seen after a fall on July 4 and had negative x-rays and CT scans.  States she is been in a wheelchair since.  She has been using pain medication and muscle relaxers at home without relief.  Today while sitting at home she had sudden worsening worsening of pain in her left hip and left thigh.  Denies any new fall or trauma.  Complains of severe pain to her left buttock, hip and thigh.  Denies any new fall.  There is no head or neck pain.  No chest or abdominal pain.  She does take Eliquis for history of DVT.  There were no fractures on imaging done on July 4.  States her pain became acutely worse tonight no focal weakness, numbness or tingling.  The history is provided by the patient.  Leg Pain Associated symptoms: no back pain and no fever        Past Medical History:  Diagnosis Date  . DVT (deep venous thrombosis) ([place]) 09/2015   RLE  . GERD (gastroesophageal reflux disease)   . Hypertension   . Hypothyroidism   . Melanoma ([place]) 06/16/2017   Facial melanoma, removed by Dr. Harvel Quale  . Peripheral vascular disease ([place])    peripheral neuropathy    Patient Active Problem List   Diagnosis Date Noted  . Advanced age 88/12/2019  . Community acquired pneumonia   . Secondary hypercoagulable state ([place]) 03/08/2020  . Hypokalemia 02/15/2020  . Colostomy status ([place]) 01/26/2020  . Diverticulitis of colon with perforation 01/26/2020  . Anemia   . Debility 01/14/2020  . Physical debility 01/14/2020  . Trauma   . Urinary retention   . Persistent atrial fibrillation ([place])   . Closed fracture of multiple ribs 12/29/2019  . Closed fracture of left distal radius 12/29/2019  . Recurrent falls 12/29/2019  .  Positive ANA (antinuclear antibody) 12/29/2019  . Osteoarthritis 11/13/2017  . Hypertriglyceridemia 01/29/2017  . Excessive gas 01/09/2017  . Elevated lipase 03/06/2016  . Chest pain 03/06/2016  . Osteopenia 02/08/2016  . Hypothyroidism 01/26/2016  . HTN (hypertension) 01/26/2016  . Left leg numbness 10/06/2012    Past Surgical History:  Procedure Laterality Date  . ABDOMINAL HYSTERECTOMY    . APPENDECTOMY    . BACK SURGERY    . BREAST SURGERY     left breast biopsy  . CARDIOVERSION N/A 05/09/2020   Procedure: CARDIOVERSION;  Surgeon: Skeet Latch, MD;  Location: [facility];  Service: Cardiovascular;  Laterality: N/A;  . COLOSTOMY N/A 01/06/2020   Procedure: Colostomy;  Surgeon: Greer Pickerel, MD;  Location: [facility];  Service: General;  Laterality: N/A;  . EYE SURGERY     cataract  . HARDWARE REMOVAL Left 01/01/2020   Procedure: HARDWARE REMOVAL;  Surgeon: Iran Planas, MD;  Location: [facility];  Service: Orthopedics;  Laterality: Left;  . LAPAROTOMY N/A 01/06/2020   Procedure: Exploratory Laparotomy, Open Sigmoid colectomy;  Surgeon: Greer Pickerel, MD;  Location: [facility];  Service: General;  Laterality: N/A;  . LIPOMA EXCISION Left 04/25/2015   Procedure: EXCISION OF LIPOMA LEFT ARM;  Surgeon: Donnie Mesa, MD;  Location: [facility];  Service: General;  Laterality: Left;  . LUMBAR  LAMINECTOMY/DECOMPRESSION MICRODISCECTOMY  11/20/2011   Procedure: LUMBAR LAMINECTOMY/DECOMPRESSION MICRODISCECTOMY;  Surgeon: Jeneen Rinks P [place];  Location: [facility];  Service: Orthopedics;  Laterality: N/A;  Decompressive Laminectomy L2 to the Sacrum (X-Ray)  . OPEN REDUCTION INTERNAL FIXATION (ORIF) DISTAL RADIAL FRACTURE Left 01/01/2020   Procedure: OPEN REDUCTION INTERNAL FIXATION (ORIF) DISTAL RADIAL FRACTURE;  Surgeon: Iran Planas, MD;  Location: [facility];  Service: Orthopedics;  Laterality: Left;  . OTHER SURGICAL HISTORY     left wrist surgery - has plate in left wrist   . OTHER SURGICAL  HISTORY     right knee surgery due to torn cartilage   . TONSILLECTOMY    . WRIST FRACTURE SURGERY Left      OB History   No obstetric history on file.     Family History  Problem Relation Age of Onset  . Cancer Mother        BREAST  . COPD Father   . Emphysema Father   . Cancer Daughter        breast    Social History   Tobacco Use  . Smoking status: Never Smoker  . Smokeless tobacco: Never Used  Vaping Use  . Vaping Use: Never used  Substance Use Topics  . Alcohol use: No  . Drug use: No    Home Medications Prior to Admission medications   Medication Sig Start Date End Date Taking? Authorizing Provider  acetaminophen (TYLENOL) 325 MG tablet Take 1-2 tablets (325-650 mg total) by mouth every 4 (four) hours as needed for mild pain. 01/19/20   Love, Ivan Anchors, PA-C  cyclobenzaprine (FLEXERIL) 5 MG tablet Take 1-2 tablets (5-10 mg total) by mouth 3 (three) times daily as needed for muscle spasms. 05/14/20   Gareth Morgan, MD  diltiazem (DILACOR XR) 180 MG 24 hr capsule Take 1 capsule (180 mg total) by mouth daily. 03/06/20   [name], MD  docusate sodium (COLACE) 100 MG capsule Take 1 capsule (100 mg total) by mouth 2 (two) times daily. 01/20/20   Love, Ivan Anchors, PA-C  ELIQUIS 5 MG TABS tablet TAKE 1 TABLET BY MOUTH TWICE A DAY Patient taking differently: Take 5 mg by mouth 2 (two) times daily.  02/11/20   Raulkar, Clide Deutscher, MD  gabapentin (NEURONTIN) 300 MG capsule Take 300 mg by mouth at bedtime.    [provider]  levothyroxine (SYNTHROID) 50 MCG tablet TAKE 1 TABLET BY MOUTH EVERY DAY Patient taking differently: Take 50 mcg by mouth daily before breakfast.  02/08/20   [name], MD  Multiple Vitamins-Minerals (PRESERVISION AREDS 2) CAPS Take 1 capsule by mouth daily with lunch.     [provider]  nadolol (CORGARD) 80 MG tablet Take 1 tablet (80 mg total) by mouth daily. 05/03/20   Deboraha Sprang, MD  ondansetron (ZOFRAN) 4 MG  tablet Take 1 tablet (4 mg total) by mouth every 8 (eight) hours as needed for nausea or vomiting. 02/01/20   Raulkar, Clide Deutscher, MD  pantoprazole (PROTONIX) 40 MG tablet Take 1 tablet (40 mg total) by mouth daily. 02/15/20   [name], MD  predniSONE (DELTASONE) 5 MG tablet Take 10 mg by mouth daily with lunch.     [provider]  senna-docusate (SENOKOT-S) 8.6-50 MG tablet Take 1 tablet by mouth at bedtime. 01/20/20 01/19/21  Bary Leriche, PA-C    Allergies    Codeine and Keflex [cephalexin]  Review of Systems   Review of Systems  Constitutional: Negative  for activity change, appetite change and fever.  HENT: Negative for congestion and rhinorrhea.   Respiratory: Negative for cough, chest tightness and shortness of breath.   Cardiovascular: Negative for chest pain.  Gastrointestinal: Negative for abdominal pain, nausea and vomiting.  Genitourinary: Negative for dysuria and hematuria.  Musculoskeletal: Positive for arthralgias and myalgias. Negative for back pain.  Neurological: Negative for dizziness, weakness and headaches.   all other systems are negative except as noted in the HPI and PMH.    Physical Exam Updated Vital Signs BP (!) 163/97 (BP Location: Right Arm)   Pulse 73   Temp 98.3 F (36.8 C) (Oral)   Resp 17   Ht 5\' 6"  (1.676 m)   Wt 70.3 kg   SpO2 98%   BMI 25.02 kg/m   Physical Exam Vitals and nursing note reviewed.  Constitutional:      General: She is not in acute distress.    Appearance: She is well-developed.  HENT:     Head: Normocephalic and atraumatic.     Mouth/Throat:     Pharynx: No oropharyngeal exudate.  Eyes:     Conjunctiva/sclera: Conjunctivae normal.     Pupils: Pupils are equal, round, and reactive to light.  Neck:     Comments: No meningismus. Cardiovascular:     Rate and Rhythm: Normal rate and regular rhythm.     Heart sounds: Normal heart sounds. No murmur heard.   Pulmonary:     Effort: Pulmonary effort is  normal. No respiratory distress.     Breath sounds: Normal breath sounds.  Abdominal:     Palpations: Abdomen is soft.     Tenderness: There is no abdominal tenderness. There is no guarding or rebound.  Musculoskeletal:        General: Swelling and tenderness present. Normal range of motion.     Cervical back: Normal range of motion and neck supple.     Comments: Extensive ecchymosis to left leg, hip and buttock.  Able to flex and extend hip and knee with minimal discomfort.  Intact DP and PT pulses.  Compartments are soft.  No T or L-spine tenderness  Skin:    General: Skin is warm.  Neurological:     General: No focal deficit present.     Mental Status: She is alert and oriented to person, place, and time. Mental status is at baseline.     Cranial Nerves: No cranial nerve deficit.     Motor: No abnormal muscle tone.     Coordination: Coordination normal.     Comments:  5/5 strength throughout. CN 2-12 intact.Equal grip strength.   Psychiatric:        Behavior: Behavior normal.     ED Results / Procedures / Treatments   Labs (all labs ordered are listed, but only abnormal results are displayed) Labs Reviewed  CBC WITH DIFFERENTIAL/PLATELET - Abnormal; Notable for the following components:      Result Value   WBC 15.8 (*)    RBC 2.85 (*)    Hemoglobin 8.6 (*)    HCT 27.4 (*)    Neutro Abs 12.2 (*)    Monocytes Absolute 1.2 (*)    Abs Immature Granulocytes 0.27 (*)    All other components within normal limits  COMPREHENSIVE METABOLIC PANEL - Abnormal; Notable for the following components:   Glucose, Bld 143 (*)    Calcium 8.7 (*)    Total Protein 5.6 (*)    Albumin 2.9 (*)    GFR calc  non Af Amer 52 (*)    GFR calc Af Amer 60 (*)    All other components within normal limits  PROTIME-INR - Abnormal; Notable for the following components:   Prothrombin Time 15.9 (*)    INR 1.3 (*)    All other components within normal limits  SARS CORONAVIRUS 2 BY RT PCR (HOSPITAL  ORDER, [facility])  HEMOGLOBIN AND HEMATOCRIT, BLOOD  TYPE AND SCREEN    EKG None  Radiology CT HIP LEFT W CONTRAST  Result Date: 05/19/2020 CLINICAL DATA:  Soft tissue mass, swelling EXAM: CT OF THE LOWER LEFT EXTREMITY WITH CONTRAST TECHNIQUE: Multidetector CT imaging of the lower left extremity was performed according to the standard protocol following intravenous contrast administration. COMPARISON:  Same-day radiograph, radiograph and CT pelvis 05/14/2020, CT abdomen and pelvis 01/11/2020 CONTRAST:  146mL OMNIPAQUE IOHEXOL 300 MG/ML  SOLN FINDINGS: Bones/Joint/Cartilage The osseous structures appear diffusely demineralized which may limit detection of small or nondisplaced fractures. Included portions of the bony pelvis are intact and congruent. The proximal femur is intact and normally located. Degenerative change of the left SI joint and symphysis pubis. Moderate degenerative features of the left hip with periacetabular spurring. Additional enthesopathic spurring noted upon the iliac crest, ischial tuberosities and greater trochanter of the left femur. Enthesopathic mineralization within the soft tissue seen along the anterior left hip, unchanged from prior. No evidence of acute avulsion. Benign bone island in the supra-acetabular femoral neck. Enthesopathic changes No sizable left hip effusion. Ligaments Suboptimally assessed by CT. Muscles and Tendons Multiple intramuscular hemorrhages throughout the left hip musculature. These involve the gluteus maximus, inferior gluteus medius, piriformis, proximal vastus lateralis, proximal adductor compartment and quadratus femoris musculature. The extent of this hemorrhagic change results in some increasing lateral bowing of the iliotibial band with additional overlying contusive changes. Furthermore, hemorrhage along the ileus psoas does track medially to the sacral insertion site and there is increasing presacral fat stranding  which is likely reactive. Soft tissues Lateral hip soft tissue swelling and contusive changes with the muscular hemorrhages, as above. Presacral fat stranding, increased from prior likely attributable to the piriformis muscle hemorrhage. There is a left lower quadrant colostomy noted. Hartmann's pouch in the pelvis with few noninflamed colonic diverticula. Postsurgical changes of the low anterior abdomen. Prior hysterectomy. Extensive atherosclerotic calcification. IMPRESSION: 1. Multiple large intramuscular hemorrhages throughout the left hip musculature, involving the gluteus maximus, inferior gluteus medius, inferior gluteus medius, proximal vastus lateralis, proximal adductor compartment and quadratus femoris musculature. The extent of this hemorrhagic change results in some increasing lateral bowing of the iliotibial band with additional overlying contusive changes. 2. Piriformis hemorrhage does track medially to the sacral insertion site and there is increasing presacral fat stranding which is likely reactive. 3. No acute fracture or traumatic malalignment of the visualized bony pelvis. 4. Moderate degenerative features of the left hip with periacetabular spurring. Electronically Signed   By: Lovena Le M.D.   On: 05/19/2020 03:34   DG Hip Unilat W or Wo Pelvis 2-3 Views Left  Result Date: 05/19/2020 CLINICAL DATA:  Fall 05/14/2020, lateral hip pain radiating down leg, bruising EXAM: DG HIP (WITH OR WITHOUT PELVIS) 2-3V LEFT COMPARISON:  CT and radiograph 05/14/2020 FINDINGS: Bones of the pelvis remain intact and congruent. Proximal femora are normally located. Vance degenerative changes throughout the lower lumbar spine, SI joints, hips and pelvis. Enthesopathic changes noted throughout the pelvis as well. There is an enlarging lateral left hip hematoma with extensive  soft tissue swelling laterally likely distending the iliotibial band. IMPRESSION: Enlarging lateral left hip hematoma with extensive soft  tissue swelling laterally likely distending the iliotibial band. No acute osseous abnormality. These results were called by telephone at the time of interpretation on 05/19/2020 at 1:39 am to provider [facility] , who verbally acknowledged these results. Electronically Signed   By: Lovena Le M.D.   On: 05/19/2020 01:39   DG FEMUR MIN 2 VIEWS LEFT  Result Date: 05/19/2020 CLINICAL DATA:  Fall with hip pain EXAM: LEFT FEMUR 2 VIEWS COMPARISON:  None. FINDINGS: There is no evidence of fracture or other focal bone lesions. Soft tissues are unremarkable. IMPRESSION: Negative. Electronically Signed   By: Ulyses Jarred M.D.   On: 05/19/2020 01:40    Procedures .Critical Care Performed by: Ezequiel Essex, MD Authorized by: Ezequiel Essex, MD   Critical care provider statement:    Critical care time (minutes):  45   Critical care was necessary to treat or prevent imminent or life-threatening deterioration of the following conditions:  Shock and trauma   Critical care was time spent personally by me on the following activities:  Discussions with consultants, evaluation of patient's response to treatment, examination of patient, ordering and performing treatments and interventions, ordering and review of laboratory studies, ordering and review of radiographic studies, pulse oximetry, re-evaluation of patient's condition, obtaining history from patient or surrogate and review of old charts   (including critical care time)  Medications Ordered in ED Medications  fentaNYL (SUBLIMAZE) injection 25 mcg (has no administration in time range)    ED Course  I have reviewed the triage vital signs and the nursing notes.  Pertinent labs & imaging results that were available during my care of the patient were reviewed by me and considered in my medical decision making (see chart for details).    MDM Rules/Calculators/A&P                         Pain, swelling.  Fall on July 4.  No new trauma.  There  is concern for enlarging hematoma on exam with anticoagulation use  Hemoglobin has dropped from 12 to 8.6.  Vitals remained stable.  Concern for enlarging hematoma based on increased soft tissue swelling and plain film.  No fracture seen.  Plan to repeat CT scan for further evaluation of hematoma.  Her compartments are soft at this time  CT with extensive muscular hemorrhage as above.  Discussed with Dr Marguerita Merles of radiology who feels there is no area of active extravasation but there is a hematoma have substantially increased since scan on July 4.  Discussed with Dr. Onnie Graham who is covering for Dr. Alvan Dame of orthopedics.  He will see patient later this morning.  He agrees with reversal of Eliquis, holding Eliquis and transfusing as necessary.  Treat pain. Monitor for compartment syndrome.  Patient with no pain with passive stretch at this point compartments remain soft.  Discussed with Dr. Alcario Drought who will admit patient.  Holding Eliquis but will not emergently reverse at this time given her clinical stability.  Vitals remained stable. Type and screen sent.  May be a blood transfusion as she has steadily dropped 4 g since July 4. Admission discussed with patient and son at bedside. Final Clinical Impression(s) / ED Diagnoses Final diagnoses:  Hematoma    Rx / DC Orders ED Discharge Orders    None       Fantasia Jinkins, Annie Main, MD 05/19/20  0612  

## 2020-05-20 LAB — BASIC METABOLIC PANEL
Anion gap: 4 — ABNORMAL LOW (ref 5–15)
BUN: 23 mg/dL (ref 8–23)
CO2: 27 mmol/L (ref 22–32)
Calcium: 8.5 mg/dL — ABNORMAL LOW (ref 8.9–10.3)
Chloride: 104 mmol/L (ref 98–111)
Creatinine, Ser: 0.94 mg/dL (ref 0.44–1.00)
GFR calc Af Amer: 60 mL/min — ABNORMAL LOW (ref >60)
GFR calc non Af Amer: 52 mL/min — ABNORMAL LOW (ref >60)
Glucose, Bld: 105 mg/dL — ABNORMAL HIGH (ref 70–99)
Potassium: 4.5 mmol/L (ref 3.5–5.1)
Sodium: 135 mmol/L (ref 135–145)

## 2020-05-20 LAB — CBC
HCT: 27.2 % — ABNORMAL LOW (ref 36.0–46.0)
Hemoglobin: 8.2 g/dL — ABNORMAL LOW (ref 12.0–15.0)
MCH: 29.3 pg (ref 26.0–34.0)
MCHC: 30.1 g/dL (ref 30.0–36.0)
MCV: 97.1 fL (ref 80.0–100.0)
Platelets: 405 10*3/uL — ABNORMAL HIGH (ref 150–400)
RBC: 2.8 MIL/uL — ABNORMAL LOW (ref 3.87–5.11)
RDW: 14.2 % (ref 11.5–15.5)
WBC: 14.3 10*3/uL — ABNORMAL HIGH (ref 4.0–10.5)
nRBC: 0 % (ref 0.0–0.2)

## 2020-05-20 LAB — IRON AND TIBC
Iron: 44 ug/dL (ref 28–170)
Saturation Ratios: 17 % (ref 10.4–31.8)
TIBC: 265 ug/dL (ref 250–450)
UIBC: 221 ug/dL

## 2020-05-20 LAB — FERRITIN: Ferritin: 77 ng/mL (ref 11–307)

## 2020-05-20 MED ORDER — TRAMADOL HCL 50 MG PO TABS: 50.0000 mg | ORAL_TABLET | Freq: Four times a day (QID) | ORAL | Status: DC | PRN

## 2020-05-20 NOTE — Evaluation (Signed)
Physical Therapy Evaluation Patient Details Name: [name] MRN: [id number] DOB: 1923-01-06 Today's Date: 05/20/2020   History of Present Illness  86 yo female admitted to ED on 7/9 for persistent L hip and thigh pain s/p fall on 7/4. Imaging 7/4 and this admission negative for fracture, CT reveals extensive muscular hematoma. PMH includes DVT, GERD, HTN, melanoma, hypothyroid, PVD, afib, colostomy, history of multiple falls, lumbar decompression.  Clinical Impression   Pt presents with LE weakness and decreased ROM L>R, LLE pain, impaired sitting and standing balance with history of falling, difficulty performing mobility tasks, and decreased activity tolerance. Pt to benefit from acute PT to address deficits. Pt required mod assist for bed mobility and transfer OOB, very limited by weakness and LLE pain. Pt and family anticipate d/c to ST-SNF to return to PLOF of ambulation with RW. PT to progress mobility as tolerated, and will continue to follow acutely.      Follow Up Recommendations SNF;Supervision/Assistance - 24 hour    Equipment Recommendations  None recommended by PT    Recommendations for Other Services       Precautions / Restrictions Precautions Precautions: Fall Restrictions Weight Bearing Restrictions: No Other Position/Activity Restrictions: WBAT per ortho MD      Mobility  Bed Mobility Overal bed mobility: Needs Assistance Bed Mobility: Supine to Sit     Supine to sit: Mod assist;HOB elevated     General bed mobility comments: Mod assist for LE management, trunk elevation, and scooting to EOB with use of bed pads. Verbal cuing for use of bed pads to perform, x2 attempts first being unsuccessful due to pt laying back down stating "I can't" due to leg pain.  Transfers Overall transfer level: Needs assistance Equipment used: Rolling walker (2 wheeled);1 person hand held assist Transfers: Sit to/from [facility] Sit to Stand: Mod  assist;From elevated surface Stand pivot transfers: Mod assist;From elevated surface       General transfer comment: Mod assist for sit to stand for power up, steadying, verbal cuing for hand placement when rising and sitting. Mod assist for stand pivot with use of HHA for steadying, guiding pt hips, and safe arrival at recliner with slow eccentric lower into chair.  Ambulation/Gait             General Gait Details: Able to pivot on RLE only, unable to WB through LLE for stepping.  Stairs            Wheelchair Mobility    Modified Rankin (Stroke Patients Only)       Balance Overall balance assessment: Needs assistance;History of Falls Sitting-balance support: No upper extremity supported;Feet supported Sitting balance-Leahy Scale: Fair     Standing balance support: Bilateral upper extremity supported;During functional activity Standing balance-Leahy Scale: Poor Standing balance comment: reliant on external support in standing                             Pertinent Vitals/Pain Pain Assessment: Faces Faces Pain Scale: Hurts whole lot Pain Location: LLE, with mobility Pain Descriptors / Indicators: Sore;Discomfort;Guarding;Grimacing Pain Intervention(s): Limited activity within patient's tolerance;Monitored during session;Repositioned    Home Living Family/patient expects to be discharged to:: Private residence Living Arrangements: Alone Available Help at Discharge: Family;Personal care attendant;Available PRN/intermittently (aides 6 hours/day, pt's 3 children stay with her at night) Type of Home: House Home Access: Stairs to enter   [employer] of Steps: 2 Home Layout: One level Home Equipment:  Walker - 2 wheels;Grab bars - tub/shower;Bedside commode;Wheelchair - manual      Prior Function Level of Independence: Needs assistance   Gait / Transfers Assistance Needed: Since fall on 7/4, pt bed-level for the most part as transferring was  too difficult. Prior to fall, pt ambulating with RW and using w/c as needed.  ADL's / Homemaking Assistance Needed: Pt reports needing total assist for ADLs since fall, prior to this was independent with ADLs        Hand Dominance   Dominant Hand: Right    Extremity/Trunk Assessment   Upper Extremity Assessment Upper Extremity Assessment: Generalized weakness    Lower Extremity Assessment Lower Extremity Assessment: Generalized weakness;LLE deficits/detail LLE Deficits / Details: able to perform ankle pump, quad set, 50% AROM hip and knee flexion during heel slide limited by pain LLE: Unable to fully assess due to pain    Cervical / Trunk Assessment Cervical / Trunk Assessment: Normal  Communication   Communication: No difficulties  Cognition Arousal/Alertness: Awake/alert Behavior During Therapy: WFL for tasks assessed/performed Overall Cognitive Status: Within Functional Limits for tasks assessed                                        General Comments      Exercises General Exercises - Lower Extremity Ankle Circles/Pumps: AROM;Both;10 reps;Seated Quad Sets: AROM;Left;5 reps;Seated   Assessment/Plan    PT Assessment Patient needs continued PT services  PT Problem List Decreased strength;Decreased mobility;Decreased safety awareness;Decreased activity tolerance;Decreased balance;Decreased knowledge of use of DME;Pain;Decreased range of motion       PT Treatment Interventions DME instruction;Gait training;Therapeutic exercise;Patient/family education;Therapeutic activities;Balance training;Stair training;Functional mobility training;Neuromuscular re-education    PT Goals (Current goals can be found in the Care Plan section)  Acute Rehab PT Goals Patient Stated Goal: go to rehab to get back to walking with RW PT Goal Formulation: With patient/family Time For Goal Achievement: 06/03/20 Potential to Achieve Goals: Good    Frequency Min 2X/week    Barriers to discharge        Co-evaluation               AM-PAC PT "6 Clicks" Mobility  Outcome Measure Help needed turning from your back to your side while in a flat bed without using bedrails?: A Lot Help needed moving from lying on your back to sitting on the side of a flat bed without using bedrails?: A Lot Help needed moving to and from a bed to a chair (including a wheelchair)?: A Lot Help needed standing up from a chair using your arms (e.g., wheelchair or bedside chair)?: A Lot Help needed to walk in hospital room?: Total Help needed climbing 3-5 steps with a railing? : Total 6 Click Score: 10    End of Session Equipment Utilized During Treatment: Gait belt Activity Tolerance: Patient limited by fatigue Patient left: in chair;with chair alarm set;with call bell/phone within reach;with family/visitor present Nurse Communication: Mobility status PT Visit Diagnosis: Other abnormalities of gait and mobility (R26.89);History of falling (Z91.81);Pain Pain - Right/Left: Left Pain - part of body: Leg;Hip    Time: 1138-1206 PT Time Calculation (min) (ACUTE ONLY): 28 min   Charges:   PT Evaluation $PT Eval Low Complexity: 1 Low PT Treatments $Therapeutic Activity: 8-22 mins       Charline Hoskinson E, PT Acute Rehabilitation Services Pager [phone]  Office [phone]   Jordie Schreur D  Masiah Lewing 05/20/2020, 1:43 PM

## 2020-05-20 NOTE — Progress Notes (Signed)
PROGRESS NOTE    KENZLIE DISCH  SNK:[id number] DOB: [birth year] DOA: 05/19/2020 PCP: [name], MD      Brief Narrative:  [name] is a 86 y.o. F with Afib and hx DVT on Eliquis, HTN, hypothyroidism, hx perforated diverticulitis s/p partial colectomy and ostomy, as well as PMR still on chronic steroids who presented with left hip pain and swelling.  Evidently the patient had had a muscle tear recently.  She was admitted to the hospital earlier this month for pneumonia, discharged home, where she fell.  Since then she has had left hip pain which has been getting progressively worse.  She had a CT scan on 7/4 that showed no fracture.  By the day of discharge, pain and bruising and swelling in her left hip were getting to be severe and the pain was preventing her from moving at all and was excruciating.  She came to the ER where CT showed large areas of intramuscular hematoma without active extravasation.  Hemoglobin down to 8.6 from 11.91-week ago.       Assessment & Plan:  Acute blood loss anemia Baseline hemoglobin 11 to 12 g/dL.  Patient has had a 3 g drop in hemoglobin from her baseline.  This appears stable now, I do not suspect she will need transfusion. -Transfusion threshold 7 g/dL -Check iron stores     Large left hip hematoma I have every expectation this is a nonoperative hematoma. This requires supportive care with hot compresses, physical therapy, nursing cares.  It should gradually resolve on its own. -Vicodin or tramadol as needed    Paroxysmal atrial fibrillation Hold Eliquis -Continue nadolol, diltiazem  History of DVT -Hold Eliquis  Hypertension -Continue nadolol, diltiazem  Hypothyroidism -Continue levothyroxine  History of perforated diverticulitis -Consult WOCN  Polymyalgia rheumatica -Continue prednisone, pantoprazole  Polyneuropathy -Continue gabapentin        Disposition: Status is: Inpatient  Remains inpatient  appropriate because: The patient is 86 years old, had a fall after discharged home after her last hospitalization 1 week ago that resulted in this hematoma which now makes it even harder for her to walk.  At baseline, she is independent with self-cares, and given her age and frailty, I have every expectation she will require nursing level rehab to obtain her prior level of function   Dispo: The patient is from: Home              Anticipated d/c is to: SNF              Anticipated d/c date is: 1 day              Patient currently is medically stable to d/c.                  MDM: The below labs and imaging reports reviewed and summarized above.  Medication management as above.     DVT prophylaxis: SCDs Start: 05/19/20 0349  Code Status: Full code Family Communication: Son by phone    Consultants:   Orthopedics  Procedures:   7/8 CT hip-left intramuscular hematomas  Antimicrobials:      Culture data:              Subjective: She has pain in the left thigh with any movement.  Unable to bear weight.  Her pain is improved with Vicodin and tramadol.  She has no dizziness, confusion, or syncope.           Objective: Vitals:  05/20/20 0038 05/20/20 0435 05/20/20 1012 05/20/20 1349  BP: 116/60 (!) 121/57 (!) 111/57 (!) 117/56  Pulse: 66 62 64 62  Resp: 20 19  18   Temp: 98 F (36.7 C) 98 F (36.7 C)  98.3 F (36.8 C)  TempSrc: Oral Oral    SpO2: 95% 95% 93% 95%  Weight:      Height:        Intake/Output Summary (Last 24 hours) at 05/20/2020 1445 Last data filed at 05/20/2020 1348 Gross per 24 hour  Intake 960 ml  Output 350 ml  Net 610 ml   Filed Weights   05/19/20 0011  Weight: 70.3 kg    Examination: General appearance: Thin elderly adult female, lying in bed, interactive.  Eating breakfast.     HEENT:    Skin:  Cardiac: RRR, no murmurs, no lower extremity edema Respiratory: Respiratory rate normal, lungs clear without rales or  wheezes Abdomen: Abdomen soft without tenderness palpation or ascites.  No guarding. MSK: Distal capillary pulses and movement of the left foot is normal.  No evidence of compartment syndrome. Neuro: Sensation is normal in the left foot.  Extraocular movements intact, speech fluent, moves upper extremities with normal strength and coordination. Psych: Attention normal, affect normal, judgment insight appear normal      Data Reviewed: I have personally reviewed following labs and imaging studies:  CBC: Recent Labs  Lab 05/19/20 0030 05/19/20 1057 05/20/20 0341  WBC 15.8*  --  14.3*  NEUTROABS 12.2*  --   --   HGB 8.6* 7.6* 8.2*  HCT 27.4* 24.6* 27.2*  MCV 96.1  --  97.1  PLT 360  --  751*   Basic Metabolic Panel: Recent Labs  Lab 05/19/20 0030 05/20/20 0341  NA 139 135  K 4.7 4.5  CL 104 104  CO2 26 27  GLUCOSE 143* 105*  BUN 20 23  CREATININE 0.94 0.94  CALCIUM 8.7* 8.5*   GFR: Estimated Creatinine Clearance: 33.5 mL/min (by C-G formula based on SCr of 0.94 mg/dL). Liver Function Tests: Recent Labs  Lab 05/19/20 0030  AST 18  ALT 24  ALKPHOS 80  BILITOT 1.1  PROT 5.6*  ALBUMIN 2.9*   No results for input(s): LIPASE, AMYLASE in the last 168 hours. No results for input(s): AMMONIA in the last 168 hours. Coagulation Profile: Recent Labs  Lab 05/19/20 0030  INR 1.3*   Cardiac Enzymes: No results for input(s): CKTOTAL, CKMB, CKMBINDEX, TROPONINI in the last 168 hours. BNP (last 3 results) No results for input(s): PROBNP in the last 8760 hours. HbA1C: No results for input(s): HGBA1C in the last 72 hours. CBG: No results for input(s): GLUCAP in the last 168 hours. Lipid Profile: No results for input(s): CHOL, HDL, LDLCALC, TRIG, CHOLHDL, LDLDIRECT in the last 72 hours. Thyroid Function Tests: No results for input(s): TSH, T4TOTAL, FREET4, T3FREE, THYROIDAB in the last 72 hours. Anemia Panel: No results for input(s): VITAMINB12, FOLATE, FERRITIN, TIBC,  IRON, RETICCTPCT in the last 72 hours. Urine analysis:    Component Value Date/Time   COLORURINE YELLOW 01/05/2020 0621   APPEARANCEUR HAZY (A) 01/05/2020 0621   LABSPEC 1.014 01/05/2020 0621   PHURINE 6.0 01/05/2020 0621   GLUCOSEU NEGATIVE 01/05/2020 0621   GLUCOSEU NEGATIVE 02/22/2016 0926   HGBUR NEGATIVE 01/05/2020 0621   BILIRUBINUR Negative 02/03/2020 1548   KETONESUR NEGATIVE 01/05/2020 0621   PROTEINUR Negative 02/03/2020 1548   PROTEINUR NEGATIVE 01/05/2020 0621   UROBILINOGEN 0.2 02/03/2020 1548   UROBILINOGEN 0.2  02/22/2016 0926   NITRITE Negative 02/03/2020 1548   NITRITE NEGATIVE 01/05/2020 0621   LEUKOCYTESUR Negative 02/03/2020 1548   LEUKOCYTESUR NEGATIVE 01/05/2020 0621   Sepsis Labs: @LABRCNTIP (procalcitonin:4,lacticacidven:4)  ) Recent Results (from the past 240 hour(s))  SARS Coronavirus 2 by RT PCR (hospital order, performed in [facility] hospital lab) Nasopharyngeal Nasopharyngeal Swab     Status: None   Collection Time: 05/11/20  3:00 PM   Specimen: Nasopharyngeal Swab  Result Value Ref Range Status   SARS Coronavirus 2 NEGATIVE NEGATIVE Final    Comment: (NOTE) SARS-CoV-2 target nucleic acids are NOT DETECTED.  The SARS-CoV-2 RNA is generally detectable in upper and lower respiratory specimens during the acute phase of infection. The lowest concentration of SARS-CoV-2 viral copies this assay can detect is 250 copies / mL. A negative result does not preclude SARS-CoV-2 infection and should not be used as the sole basis for treatment or other patient management decisions.  A negative result may occur with improper specimen collection / handling, submission of specimen other than nasopharyngeal swab, presence of viral mutation(s) within the areas targeted by this assay, and inadequate number of viral copies (<250 copies / mL). A negative result must be combined with clinical observations, patient history, and epidemiological information.  Fact  Sheet for Patients:   StrictlyIdeas.no  Fact Sheet for Healthcare Providers: BankingDealers.co.za  This test is not yet approved or  cleared by the [place] FDA and has been authorized for detection and/or diagnosis of SARS-CoV-2 by FDA under an Emergency Use Authorization (EUA).  This EUA will remain in effect (meaning this test can be used) for the duration of the COVID-19 declaration under Section 564(b)(1) of the Act, 21 U.S.C. section 360bbb-3(b)(1), unless the authorization is terminated or revoked sooner.  Performed at [facility], [place] [address], [place], [place] [address]   SARS Coronavirus 2 by RT PCR (hospital order, performed in [facility] hospital lab) Nasopharyngeal Nasopharyngeal Swab     Status: None   Collection Time: 05/19/20  4:53 AM   Specimen: Nasopharyngeal Swab  Result Value Ref Range Status   SARS Coronavirus 2 NEGATIVE NEGATIVE Final    Comment: (NOTE) SARS-CoV-2 target nucleic acids are NOT DETECTED.  The SARS-CoV-2 RNA is generally detectable in upper and lower respiratory specimens during the acute phase of infection. The lowest concentration of SARS-CoV-2 viral copies this assay can detect is 250 copies / mL. A negative result does not preclude SARS-CoV-2 infection and should not be used as the sole basis for treatment or other patient management decisions.  A negative result may occur with improper specimen collection / handling, submission of specimen other than nasopharyngeal swab, presence of viral mutation(s) within the areas targeted by this assay, and inadequate number of viral copies (<250 copies / mL). A negative result must be combined with clinical observations, patient history, and epidemiological information.  Fact Sheet for Patients:   StrictlyIdeas.no  Fact Sheet for Healthcare Providers: BankingDealers.co.za  This test is not  yet approved or  cleared by the [place] FDA and has been authorized for detection and/or diagnosis of SARS-CoV-2 by FDA under an Emergency Use Authorization (EUA).  This EUA will remain in effect (meaning this test can be used) for the duration of the COVID-19 declaration under Section 564(b)(1) of the Act, 21 U.S.C. section 360bbb-3(b)(1), unless the authorization is terminated or revoked sooner.  Performed at [facility], [place] [address] [place],  [address]  Radiology Studies: CT HIP LEFT W CONTRAST  Addendum Date: 05/19/2020   ADDENDUM REPORT: 05/19/2020 03:50 ADDENDUM: No sites of active contrast extravasation identified within the intramuscular hemorrhage. However the mixed attenuation of these collections is suggestive of mixed age blood products including acute on more subacute hemorrhage. Can reflect sequela complex high-grade tearing, though underlying musculotendinous integrity is difficult to fully assess on CT and in the setting of such extensive hemorrhages. Initial findings and addendum were discussed via telephone on 05/19/2020 at 3:49 am to provider [facility] , who verbally acknowledged these results. Electronically Signed   By: Lovena Le M.D.   On: 05/19/2020 03:50   Result Date: 05/19/2020 CLINICAL DATA:  Soft tissue mass, swelling EXAM: CT OF THE LOWER LEFT EXTREMITY WITH CONTRAST TECHNIQUE: Multidetector CT imaging of the lower left extremity was performed according to the standard protocol following intravenous contrast administration. COMPARISON:  Same-day radiograph, radiograph and CT pelvis 05/14/2020, CT abdomen and pelvis 01/11/2020 CONTRAST:  151mL OMNIPAQUE IOHEXOL 300 MG/ML  SOLN FINDINGS: Bones/Joint/Cartilage The osseous structures appear diffusely demineralized which may limit detection of small or nondisplaced fractures. Included portions of the bony pelvis are intact and congruent. The proximal femur is intact and  normally located. Degenerative change of the left SI joint and symphysis pubis. Moderate degenerative features of the left hip with periacetabular spurring. Additional enthesopathic spurring noted upon the iliac crest, ischial tuberosities and greater trochanter of the left femur. Enthesopathic mineralization within the soft tissue seen along the anterior left hip, unchanged from prior. No evidence of acute avulsion. Benign bone [name] in the supra-acetabular femoral neck. Enthesopathic changes No sizable left hip effusion. Ligaments Suboptimally assessed by CT. Muscles and Tendons Multiple intramuscular hemorrhages throughout the left hip musculature. These involve the gluteus maximus, inferior gluteus medius, piriformis, proximal vastus lateralis, proximal adductor compartment and quadratus femoris musculature. The extent of this hemorrhagic change results in some increasing lateral bowing of the iliotibial band with additional overlying contusive changes. Furthermore, hemorrhage along the ileus psoas does track medially to the sacral insertion site and there is increasing presacral fat stranding which is likely reactive. Soft tissues Lateral hip soft tissue swelling and contusive changes with the muscular hemorrhages, as above. Presacral fat stranding, increased from prior likely attributable to the piriformis muscle hemorrhage. There is a left lower quadrant colostomy noted. Hartmann's pouch in the pelvis with few noninflamed colonic diverticula. Postsurgical changes of the low anterior abdomen. Prior hysterectomy. Extensive atherosclerotic calcification. IMPRESSION: 1. Multiple large intramuscular hemorrhages throughout the left hip musculature, involving the gluteus maximus, inferior gluteus medius, inferior gluteus medius, proximal vastus lateralis, proximal adductor compartment and quadratus femoris musculature. The extent of this hemorrhagic change results in some increasing lateral bowing of the iliotibial  band with additional overlying contusive changes. 2. Piriformis hemorrhage does track medially to the sacral insertion site and there is increasing presacral fat stranding which is likely reactive. 3. No acute fracture or traumatic malalignment of the visualized bony pelvis. 4. Moderate degenerative features of the left hip with periacetabular spurring. Electronically Signed: By: Lovena Le M.D. On: 05/19/2020 03:34   DG Hip Unilat W or Wo Pelvis 2-3 Views Left  Result Date: 05/19/2020 CLINICAL DATA:  Fall 05/14/2020, lateral hip pain radiating down leg, bruising EXAM: DG HIP (WITH OR WITHOUT PELVIS) 2-3V LEFT COMPARISON:  CT and radiograph 05/14/2020 FINDINGS: Bones of the pelvis remain intact and congruent. Proximal femora are normally located. Vance degenerative changes throughout the lower lumbar spine, SI joints, hips and  pelvis. Enthesopathic changes noted throughout the pelvis as well. There is an enlarging lateral left hip hematoma with extensive soft tissue swelling laterally likely distending the iliotibial band. IMPRESSION: Enlarging lateral left hip hematoma with extensive soft tissue swelling laterally likely distending the iliotibial band. No acute osseous abnormality. These results were called by telephone at the time of interpretation on 05/19/2020 at 1:39 am to provider [facility] , who verbally acknowledged these results. Electronically Signed   By: Lovena Le M.D.   On: 05/19/2020 01:39   DG FEMUR MIN 2 VIEWS LEFT  Result Date: 05/19/2020 CLINICAL DATA:  Fall with hip pain EXAM: LEFT FEMUR 2 VIEWS COMPARISON:  None. FINDINGS: There is no evidence of fracture or other focal bone lesions. Soft tissues are unremarkable. IMPRESSION: Negative. Electronically Signed   By: Ulyses Jarred M.D.   On: 05/19/2020 01:40        Scheduled Meds: . diltiazem  180 mg Oral Daily  . docusate sodium  100 mg Oral BID  . gabapentin  300 mg Oral QHS  . levothyroxine  50 mcg Oral Q0600  .  multivitamin  1 tablet Oral Q lunch  . nadolol  80 mg Oral Daily  . pantoprazole  40 mg Oral Daily  . predniSONE  9 mg Oral Q lunch  . senna-docusate  1 tablet Oral QHS   Continuous Infusions:   LOS: 1 day    Time spent: 25 minutes    Edwin Dada, MD [facility] 05/20/2020, 2:45 PM     Please page though [facility] or Epic secure chat:  For password, contact charge nurse

## 2020-05-21 LAB — CBC
HCT: 27.9 % — ABNORMAL LOW (ref 36.0–46.0)
Hemoglobin: 8.6 g/dL — ABNORMAL LOW (ref 12.0–15.0)
MCH: 29.8 pg (ref 26.0–34.0)
MCHC: 30.8 g/dL (ref 30.0–36.0)
MCV: 96.5 fL (ref 80.0–100.0)
Platelets: 427 10*3/uL — ABNORMAL HIGH (ref 150–400)
RBC: 2.89 MIL/uL — ABNORMAL LOW (ref 3.87–5.11)
RDW: 14.3 % (ref 11.5–15.5)
WBC: 13.9 10*3/uL — ABNORMAL HIGH (ref 4.0–10.5)
nRBC: 0 % (ref 0.0–0.2)

## 2020-05-21 LAB — URINALYSIS, ROUTINE W REFLEX MICROSCOPIC
Bilirubin Urine: NEGATIVE
Glucose, UA: NEGATIVE mg/dL
Hgb urine dipstick: NEGATIVE
Ketones, ur: NEGATIVE mg/dL
Leukocytes,Ua: NEGATIVE
Nitrite: NEGATIVE
Protein, ur: NEGATIVE mg/dL
Specific Gravity, Urine: 1.009 (ref 1.005–1.030)
pH: 6 (ref 5.0–8.0)

## 2020-05-21 MED ORDER — CEFDINIR 300 MG PO CAPS
300.0000 mg | ORAL_CAPSULE | Freq: Two times a day (BID) | ORAL | Status: DC
  Administered 2020-05-22 – 2020-05-24 (×5): 300 mg via ORAL
  Filled 2020-05-21 (×5): qty 1

## 2020-05-21 MED ORDER — SODIUM CHLORIDE 0.9 % IV SOLN
1.0000 g | Freq: Once | INTRAVENOUS | Status: AC
  Administered 2020-05-21: 1 g via INTRAVENOUS
  Filled 2020-05-21: qty 1

## 2020-05-21 NOTE — Plan of Care (Signed)

## 2020-05-21 NOTE — Progress Notes (Signed)
PROGRESS NOTE    [name]  CHE:[id number] DOB: Jul 17, 1923 DOA: 05/19/2020 PCP: [name], MD      Brief Narrative:  [name] is a 86 y.o. F with Afib and hx DVT on Eliquis, HTN, hypothyroidism, hx perforated diverticulitis s/p partial colectomy and ostomy, as well as PMR still on chronic steroids who presented with left hip pain and swelling.  Evidently the patient had had a muscle tear recently.  She was admitted to the hospital earlier this month for pneumonia, discharged home, where she fell.  Since then she has had left hip pain which has been getting progressively worse.  She had a CT scan on 7/4 that showed no fracture.  By the day of discharge, pain and bruising and swelling in her left hip were getting to be severe and the pain was preventing her from moving at all and was excruciating.  She came to the ER where CT showed large areas of intramuscular hematoma without active extravasation.  Hemoglobin down to 8.6 from 11.91-week ago.       Assessment & Plan:  Acute blood loss anemia Baseline hemoglobin 11 to 12 g/dL.  Patient has had a 3 g drop in hemoglobin from her baseline.  This appears stable, hemoglobin today is stable.  Iron studies normal. -Transfusion threshold 7 g/dL     Large left hip hematoma I have every expectation this is a nonoperative hematoma. This requires supportive care with hot compresses, physical therapy, nursing cares.  It should gradually resolve on its own over the course of several weeks. -Vicodin or tramadol as needed    Paroxysmal atrial fibrillation -Hold Eliquis until hematoma is clearly resolving -Continue nadolol, diltiazem  History of DVT -Hold Eliquis  Hypertension Blood pressure normal -Continue atenolol, diltiazem   Hypothyroidism -Continue levothyroxine  History of perforated diverticulitis -Consult WOCN  Polymyalgia rheumatica -Continue prednisone, pantoprazole  Polyneuropathy -Continue  gabapentin        Disposition: Status is: Inpatient  Remains inpatient appropriate because: The patient is 86 years old, had a fall after discharged home after her last hospitalization 1 week ago that resulted in this hematoma which now makes it even harder for her to walk.  At baseline, she is independent with self-cares, and given her age and frailty, I have every expectation she will require nursing level rehab to obtain her prior level of function   Dispo: The patient is from: Home              Anticipated d/c is to: SNF              Anticipated d/c date is: 1 day              Patient currently is medically stable to d/c.                  MDM: The below labs and imaging reports reviewed and summarized above.  Medication management as above.     DVT prophylaxis: SCDs Start: 05/19/20 0349  Code Status: Full code Family Communication:      Consultants:   Orthopedics  Procedures:   7/8 CT hip-left intramuscular hematomas  Antimicrobials:      Culture data:              Subjective: Still has severe left thigh pain with movement at all.  Unable to bear weight or sit on the edge of the bed.  She is dizzy with standing today.  Objective: Vitals:   05/20/20 2014 05/21/20 0454 05/21/20 1342 05/21/20 1459  BP: (!) 124/59 (!) 146/64 132/67 (!) 145/69  Pulse: 68 64 70 65  Resp: 20 19 20    Temp: 98.1 F (36.7 C) 97.9 F (36.6 C) 98.3 F (36.8 C)   TempSrc: Oral Oral    SpO2: 94% 94% 95% 95%  Weight:      Height:        Intake/Output Summary (Last 24 hours) at 05/21/2020 1647 Last data filed at 05/21/2020 1459 Gross per 24 hour  Intake 1600 ml  Output 2525 ml  Net -925 ml   Filed Weights   05/19/20 0011  Weight: 70.3 kg    Examination: General appearance: Thin elderly adult female, lying in bed, interactive, pleasant.     HEENT:    Skin: Bruising on the left thigh. Cardiac: RRR, no murmurs, no lower extremity  edema Respiratory: Respiratory rate normal, lungs clear without rales or wheezes. Abdomen: Abdomen soft without tenderness palpation or guarding. MSK: The distal capillary refill in the left leg is normal, movement of the left ankle is normal.  She has good distal pulses. Neuro:    Psych: Attention normal, affect normal, judgment insight appear normal          Data Reviewed: I have personally reviewed following labs and imaging studies:  CBC: Recent Labs  Lab 05/19/20 0030 05/19/20 1057 05/20/20 0341 05/21/20 0752  WBC 15.8*  --  14.3* 13.9*  NEUTROABS 12.2*  --   --   --   HGB 8.6* 7.6* 8.2* 8.6*  HCT 27.4* 24.6* 27.2* 27.9*  MCV 96.1  --  97.1 96.5  PLT 360  --  405* 779*   Basic Metabolic Panel: Recent Labs  Lab 05/19/20 0030 05/20/20 0341  NA 139 135  K 4.7 4.5  CL 104 104  CO2 26 27  GLUCOSE 143* 105*  BUN 20 23  CREATININE 0.94 0.94  CALCIUM 8.7* 8.5*   GFR: Estimated Creatinine Clearance: 33.5 mL/min (by C-G formula based on SCr of 0.94 mg/dL). Liver Function Tests: Recent Labs  Lab 05/19/20 0030  AST 18  ALT 24  ALKPHOS 80  BILITOT 1.1  PROT 5.6*  ALBUMIN 2.9*   No results for input(s): LIPASE, AMYLASE in the last 168 hours. No results for input(s): AMMONIA in the last 168 hours. Coagulation Profile: Recent Labs  Lab 05/19/20 0030  INR 1.3*   Cardiac Enzymes: No results for input(s): CKTOTAL, CKMB, CKMBINDEX, TROPONINI in the last 168 hours. BNP (last 3 results) No results for input(s): PROBNP in the last 8760 hours. HbA1C: No results for input(s): HGBA1C in the last 72 hours. CBG: No results for input(s): GLUCAP in the last 168 hours. Lipid Profile: No results for input(s): CHOL, HDL, LDLCALC, TRIG, CHOLHDL, LDLDIRECT in the last 72 hours. Thyroid Function Tests: No results for input(s): TSH, T4TOTAL, FREET4, T3FREE, THYROIDAB in the last 72 hours. Anemia Panel: Recent Labs    05/20/20 1508  FERRITIN 77  TIBC 265  IRON 44    Urine analysis:    Component Value Date/Time   COLORURINE YELLOW 05/21/2020 [place] 05/21/2020 0927   LABSPEC 1.009 05/21/2020 0927   PHURINE 6.0 05/21/2020 [place] 05/21/2020 [place] 02/22/2016 0926   HGBUR NEGATIVE 05/21/2020 [place] NEGATIVE 05/21/2020 0927   BILIRUBINUR Negative 02/03/2020 [place] 05/21/2020 0927   PROTEINUR NEGATIVE 05/21/2020 0927   UROBILINOGEN  0.2 02/03/2020 1548   UROBILINOGEN 0.2 02/22/2016 0926   NITRITE NEGATIVE 05/21/2020 0927   LEUKOCYTESUR NEGATIVE 05/21/2020 3888   Sepsis Labs: @LABRCNTIP (procalcitonin:4,lacticacidven:4)  ) Recent Results (from the past 240 hour(s))  SARS Coronavirus 2 by RT PCR (hospital order, performed in [facility] hospital lab) Nasopharyngeal Nasopharyngeal Swab     Status: None   Collection Time: 05/19/20  4:53 AM   Specimen: Nasopharyngeal Swab  Result Value Ref Range Status   SARS Coronavirus 2 NEGATIVE NEGATIVE Final    Comment: (NOTE) SARS-CoV-2 target nucleic acids are NOT DETECTED.  The SARS-CoV-2 RNA is generally detectable in upper and lower respiratory specimens during the acute phase of infection. The lowest concentration of SARS-CoV-2 viral copies this assay can detect is 250 copies / mL. A negative result does not preclude SARS-CoV-2 infection and should not be used as the sole basis for treatment or other patient management decisions.  A negative result may occur with improper specimen collection / handling, submission of specimen other than nasopharyngeal swab, presence of viral mutation(s) within the areas targeted by this assay, and inadequate number of viral copies (<250 copies / mL). A negative result must be combined with clinical observations, patient history, and epidemiological information.  Fact Sheet for Patients:   StrictlyIdeas.no  Fact Sheet for Healthcare  Providers: BankingDealers.co.za  This test is not yet approved or  cleared by the [place] FDA and has been authorized for detection and/or diagnosis of SARS-CoV-2 by FDA under an Emergency Use Authorization (EUA).  This EUA will remain in effect (meaning this test can be used) for the duration of the COVID-19 declaration under Section 564(b)(1) of the Act, 21 U.S.C. section 360bbb-3(b)(1), unless the authorization is terminated or revoked sooner.  Performed at [facility], [place] [address] [place], [place] [address]          Radiology Studies: No results found.      Scheduled Meds: . [START ON 05/22/2020] cefdinir  300 mg Oral Q12H  . diltiazem  180 mg Oral Daily  . docusate sodium  100 mg Oral BID  . gabapentin  300 mg Oral QHS  . levothyroxine  50 mcg Oral Q0600  . multivitamin  1 tablet Oral Q lunch  . nadolol  80 mg Oral Daily  . pantoprazole  40 mg Oral Daily  . predniSONE  9 mg Oral Q lunch  . senna-docusate  1 tablet Oral QHS   Continuous Infusions:   LOS: 2 days    Time spent: 25 minutes    Edwin Dada, MD [facility] 05/21/2020, 4:47 PM     Please page though [place] or Epic secure chat:  For password, contact charge nurse

## 2020-05-22 DIAGNOSIS — S7012XA Contusion of left thigh, initial encounter: Secondary | ICD-10-CM

## 2020-05-22 LAB — URINE CULTURE: Culture: NO GROWTH

## 2020-05-22 LAB — CBC
HCT: 27.3 % — ABNORMAL LOW (ref 36.0–46.0)
Hemoglobin: 8.4 g/dL — ABNORMAL LOW (ref 12.0–15.0)
MCH: 29.9 pg (ref 26.0–34.0)
MCHC: 30.8 g/dL (ref 30.0–36.0)
MCV: 97.2 fL (ref 80.0–100.0)
Platelets: 436 10*3/uL — ABNORMAL HIGH (ref 150–400)
RBC: 2.81 MIL/uL — ABNORMAL LOW (ref 3.87–5.11)
RDW: 14.3 % (ref 11.5–15.5)
WBC: 14.9 10*3/uL — ABNORMAL HIGH (ref 4.0–10.5)
nRBC: 0 % (ref 0.0–0.2)

## 2020-05-22 MED ORDER — CHLORHEXIDINE GLUCONATE CLOTH 2 % EX PADS
6.0000 | MEDICATED_PAD | Freq: Every day | CUTANEOUS | Status: DC
  Administered 2020-05-22 – 2020-05-24 (×3): 6 via TOPICAL

## 2020-05-22 MED ORDER — DILTIAZEM HCL ER COATED BEADS 120 MG PO CP24
120.0000 mg | ORAL_CAPSULE | Freq: Every day | ORAL | Status: DC
  Administered 2020-05-23 – 2020-05-24 (×2): 120 mg via ORAL
  Filled 2020-05-22 (×2): qty 1

## 2020-05-22 MED ORDER — SODIUM CHLORIDE 0.9 % IV BOLUS
250.0000 mL | Freq: Once | INTRAVENOUS | Status: AC
  Administered 2020-05-22: 250 mL via INTRAVENOUS

## 2020-05-22 NOTE — Progress Notes (Signed)
Provider paged regarding obtaining straight cath orders with parameters.

## 2020-05-22 NOTE — Consult Note (Signed)
   [facility] Inpatient Consult   05/22/2020  [name] 11-15-1922 [id number]   Patient chart has been reviewed for readmissions less than 30 days and for high risk score for unplanned readmissions.  Patient assessed for community [place] Management follow up needs.   Will continue to follow for progression and disposition plans.  Of note, [facility] Care Management services does not replace or interfere with any services that are arranged by inpatient case management or social work.  [name], MSN, [place] [facility] Liaison Nurse Mobile Phone [phone]  Toll free office [phone]

## 2020-05-22 NOTE — Progress Notes (Signed)
Very orthostatic.  See flow sheet.  Felt dizzy and weak on way to bathroom.  Assisted to sitting position on floor by this nurse and tech.  Still unable to empty bladder.  Transferred from toilet to recliner.  Sitting in recliner with feet elevated.  Will check bladder scan when back to bed.

## 2020-05-22 NOTE — NC FL2 (Signed)
[place] MEDICAID FL2 LEVEL OF CARE SCREENING TOOL     IDENTIFICATION  Patient Name: [name] Birthdate: [birth year] Sex: female Admission Date (Current Location): 05/19/2020  [facility] and [place] Number:  Herbalist and Address:  [facility],  [place] [address] [place]      Provider Number: [id number]  Attending Physician Name and Address:  Guilford Shi, MD  Relative Name and Phone Number:  Lubertha Sayres Daughter [phone] or [place], Erdman [phone]  [phone] or Leonette, Tischer [phone]    Current Level of Care: Hospital Recommended Level of Care: [place] Prior Approval Number:    Date Approved/Denied:   PASRR Number: [id number]  Discharge Plan: SNF    Current Diagnoses: Patient Active Problem List   Diagnosis Date Noted  . Acute blood loss anemia 05/19/2020  . Traumatic hematoma of left hip 05/19/2020  . Current chronic use of systemic steroids 05/19/2020  . Thigh hematoma 05/19/2020  . Advanced age 86/12/2019  . Community acquired pneumonia   . Secondary hypercoagulable state ([place]) 03/08/2020  . Hypokalemia 02/15/2020  . Colostomy status ([place]) 01/26/2020  . Diverticulitis of colon with perforation 01/26/2020  . Anemia   . Debility 01/14/2020  . Physical debility 01/14/2020  . Trauma   . Urinary retention   . Persistent atrial fibrillation ([place])   . Closed fracture of multiple ribs 12/29/2019  . Closed fracture of left distal radius 12/29/2019  . Recurrent falls 12/29/2019  . Positive ANA (antinuclear antibody) 12/29/2019  . Osteoarthritis 11/13/2017  . Hypertriglyceridemia 01/29/2017  . Excessive gas 01/09/2017  . Elevated lipase 03/06/2016  . Chest pain 03/06/2016  . Osteopenia 02/08/2016  . Hypothyroidism 01/26/2016  . HTN (hypertension) 01/26/2016  . Left leg numbness 10/06/2012    Orientation RESPIRATION BLADDER Height & Weight     Time,  Situation, Place, Self  Normal Continent Weight: 155 lb (70.3 kg) Height:  5\' 6"  (167.6 cm)  BEHAVIORAL SYMPTOMS/MOOD NEUROLOGICAL BOWEL NUTRITION STATUS      Colostomy Diet (Regular)  AMBULATORY STATUS COMMUNICATION OF NEEDS Skin   Limited Assist Verbally Normal                       Personal Care Assistance Level of Assistance  Bathing, Feeding, Dressing Bathing Assistance: Limited assistance Feeding assistance: Independent Dressing Assistance: Limited assistance     Functional Limitations Info  Sight, Hearing, Speech Sight Info: Adequate Hearing Info: Adequate Speech Info: Adequate    SPECIAL CARE FACTORS FREQUENCY  PT (By licensed PT), OT (By licensed OT)     PT Frequency: Minimum 5x a week OT Frequency: Minimum 5x a week            Contractures Contractures Info: Not present    Additional Factors Info  Code Status, Allergies Code Status Info: Full Code Allergies Info: Codeine and Keflex           Current Medications (05/22/2020):  This is the current hospital active medication list Current Facility-Administered Medications  Medication Dose Route Frequency Provider Last Rate Last Admin  . acetaminophen (TYLENOL) tablet 650 mg  650 mg Oral Q6H PRN Etta Quill, DO   650 mg at 05/22/20 0022   Or  . acetaminophen (TYLENOL) suppository 650 mg  650 mg Rectal Q6H PRN Etta Quill, DO      . cefdinir (OMNICEF) capsule 300 mg  300 mg Oral Q12H Edwin Dada, MD   300 mg at 05/22/20 0959  .  Chlorhexidine Gluconate Cloth 2 % PADS 6 each  6 each Topical Daily Guilford Shi, MD   6 each at 05/22/20 1351  . cyclobenzaprine (FLEXERIL) tablet 5-10 mg  5-10 mg Oral TID PRN Etta Quill, DO   10 mg at 05/22/20 0524  . [START ON 05/23/2020] diltiazem (CARDIZEM CD) 24 hr capsule 120 mg  120 mg Oral Daily Kamineni, Neelima, MD      . docusate sodium (COLACE) capsule 100 mg  100 mg Oral BID Jennette Kettle M, DO   100 mg at 05/22/20 1000  . gabapentin  (NEURONTIN) capsule 300 mg  300 mg Oral QHS Jennette Kettle M, DO   300 mg at 05/21/20 2210  . HYDROcodone-acetaminophen (NORCO/VICODIN) 5-325 MG per tablet 1-2 tablet  1-2 tablet Oral Q6H PRN Etta Quill, DO   1 tablet at 05/22/20 1351  . levothyroxine (SYNTHROID) tablet 50 mcg  50 mcg Oral Q0600 Etta Quill, DO   50 mcg at 05/22/20 0525  . multivitamin (PROSIGHT) tablet 1 tablet  1 tablet Oral Q lunch Danford, Suann Larry, MD   1 tablet at 05/22/20 1351  . nadolol (CORGARD) tablet 80 mg  80 mg Oral Daily Etta Quill, DO   80 mg at 05/22/20 0959  . ondansetron (ZOFRAN) tablet 4 mg  4 mg Oral Q6H PRN Etta Quill, DO       Or  . ondansetron [facility]) injection 4 mg  4 mg Intravenous Q6H PRN Etta Quill, DO      . pantoprazole (PROTONIX) EC tablet 40 mg  40 mg Oral Daily Jennette Kettle M, DO   40 mg at 05/22/20 0959  . predniSONE (DELTASONE) tablet 9 mg  9 mg Oral Q lunch Danford, Suann Larry, MD   9 mg at 05/22/20 1351  . senna-docusate (Senokot-S) tablet 1 tablet  1 tablet Oral QHS Etta Quill, DO   1 tablet at 05/21/20 2210  . traMADol (ULTRAM) tablet 50 mg  50 mg Oral Q6H PRN Danford, Suann Larry, MD         Discharge Medications: Please see discharge summary for a list of discharge medications.  Relevant Imaging Results:  Relevant Lab Results:   Additional Information SSN [id number]  Ross Ludwig, LCSW

## 2020-05-22 NOTE — Progress Notes (Addendum)
PROGRESS NOTE    [name]  KNL:[id number]  DOB: Jan 07, 1923  PCP: [name], MD Admit date:05/19/2020 Chief compliant: Left hip pain and swelling Hospital course: 86 y.o. F with Afib and hx DVT on Eliquis, HTN, hypothyroidism, hx perforated diverticulitis s/p partial colectomy and ostomy, as well as PMR still on chronic steroids who presented with left hip pain and swelling. Evidently the patient had had a muscle tear recently.  She was admitted to the hospital earlier this month for pneumonia, discharged home, where she fell.  Since then she has had left hip pain which has been getting progressively worse.  She had a CT scan on 7/4 that showed no fracture.Pain and bruising and swelling in her left hip were getting to be severe and the pain was preventing her from moving at all and was excruciating.  She came to the ER where CT showed multiple large areas of intramuscular hematomas without active extravasation.Per radiology, mixed age blood products including acute on more subacute hemorrhage noted which can reflect sequela complex high-grade tearing, though underlying musculotendinous integrity is difficult to fully assess on CT and in the setting of such extensive hemorrhages.Hemoglobin down to 8.4 from 11.9, 1week ago.  Subjective:  Patient noted to be sitting in bedside chair, no acute distress.  Son at bedside.  Patient reports 10 out of 10 pain especially when she moves.  Does not have significant pain at rest. Reports feeling of legs giving away as well as dizzy spells when she stands up--had some dizziness on walking to the bathroom today.   Objective: Vitals:   05/21/20 1342 05/21/20 1459 05/21/20 1953 05/22/20 0532  BP: 132/67 (!) 145/69 127/65 (!) 146/57  Pulse: 70 65 67 66  Resp: 20  20 20   Temp: 98.3 F (36.8 C)  98.1 F (36.7 C) 97.9 F (36.6 C)  TempSrc:   Oral Oral  SpO2: 95% 95% 95% 95%  Weight:      Height:        Intake/Output Summary (Last 24 hours)  at 05/22/2020 0959 Last data filed at 05/22/2020 0900 Gross per 24 hour  Intake 940 ml  Output 2275 ml  Net -1335 ml   Filed Weights   05/19/20 0011  Weight: 70.3 kg    Physical Examination: General: Moderately built, no acute distress noted Head ENT: Atraumatic normocephalic, PERRLA, neck supple Heart: S1-S2 heard, regular rate and rhythm, no murmurs.  No leg edema noted Lungs: Equal air entry bilaterally, no rhonchi or rales on exam, no accessory muscle use Abdomen: Ileostomy bag, bowel sounds heard, soft, nontender, nondistended. Extremities: Large left hip hematoma, no pedal edema.  No cyanosis or clubbing. Neurological: Awake alert oriented x3, no focal weakness or numbness, strength and sensations to crude touch intact Skin: Extensive bruising/ecchymosis noted along left hip/lateral thigh area    Data Reviewed: I have personally reviewed following labs and imaging studies  CBC: Recent Labs  Lab 05/19/20 0030 05/19/20 1057 05/20/20 0341 05/21/20 0752 05/22/20 0413  WBC 15.8*  --  14.3* 13.9* 14.9*  NEUTROABS 12.2*  --   --   --   --   HGB 8.6* 7.6* 8.2* 8.6* 8.4*  HCT 27.4* 24.6* 27.2* 27.9* 27.3*  MCV 96.1  --  97.1 96.5 97.2  PLT 360  --  405* 427* 790*   Basic Metabolic Panel: Recent Labs  Lab 05/19/20 0030 05/20/20 0341  NA 139 135  K 4.7 4.5  CL 104 104  CO2 26 27  GLUCOSE 143* 105*  BUN 20 23  CREATININE 0.94 0.94  CALCIUM 8.7* 8.5*   GFR: Estimated Creatinine Clearance: 33.5 mL/min (by C-G formula based on SCr of 0.94 mg/dL). Liver Function Tests: Recent Labs  Lab 05/19/20 0030  AST 18  ALT 24  ALKPHOS 80  BILITOT 1.1  PROT 5.6*  ALBUMIN 2.9*   No results for input(s): LIPASE, AMYLASE in the last 168 hours. No results for input(s): AMMONIA in the last 168 hours. Coagulation Profile: Recent Labs  Lab 05/19/20 0030  INR 1.3*   Cardiac Enzymes: No results for input(s): CKTOTAL, CKMB, CKMBINDEX, TROPONINI in the last 168 hours. BNP  (last 3 results) No results for input(s): PROBNP in the last 8760 hours. HbA1C: No results for input(s): HGBA1C in the last 72 hours. CBG: No results for input(s): GLUCAP in the last 168 hours. Lipid Profile: No results for input(s): CHOL, HDL, LDLCALC, TRIG, CHOLHDL, LDLDIRECT in the last 72 hours. Thyroid Function Tests: No results for input(s): TSH, T4TOTAL, FREET4, T3FREE, THYROIDAB in the last 72 hours. Anemia Panel: Recent Labs    05/20/20 1508  FERRITIN 77  TIBC 265  IRON 44   Sepsis Labs: No results for input(s): PROCALCITON, LATICACIDVEN in the last 168 hours.  Recent Results (from the past 240 hour(s))  SARS Coronavirus 2 by RT PCR (hospital order, performed in [facility] hospital lab) Nasopharyngeal Nasopharyngeal Swab     Status: None   Collection Time: 05/19/20  4:53 AM   Specimen: Nasopharyngeal Swab  Result Value Ref Range Status   SARS Coronavirus 2 NEGATIVE NEGATIVE Final    Comment: (NOTE) SARS-CoV-2 target nucleic acids are NOT DETECTED.  The SARS-CoV-2 RNA is generally detectable in upper and lower respiratory specimens during the acute phase of infection. The lowest concentration of SARS-CoV-2 viral copies this assay can detect is 250 copies / mL. A negative result does not preclude SARS-CoV-2 infection and should not be used as the sole basis for treatment or other patient management decisions.  A negative result may occur with improper specimen collection / handling, submission of specimen other than nasopharyngeal swab, presence of viral mutation(s) within the areas targeted by this assay, and inadequate number of viral copies (<250 copies / mL). A negative result must be combined with clinical observations, patient history, and epidemiological information.  Fact Sheet for Patients:   StrictlyIdeas.no  Fact Sheet for Healthcare Providers: BankingDealers.co.za  This test is not yet approved or   cleared by the [place] FDA and has been authorized for detection and/or diagnosis of SARS-CoV-2 by FDA under an Emergency Use Authorization (EUA).  This EUA will remain in effect (meaning this test can be used) for the duration of the COVID-19 declaration under Section 564(b)(1) of the Act, 21 U.S.C. section 360bbb-3(b)(1), unless the authorization is terminated or revoked sooner.  Performed at [facility], [place] [address] [place], [place] [address]   Culture, Urine     Status: None   Collection Time: 05/21/20  9:27 AM   Specimen: Urine, Catheterized  Result Value Ref Range Status   Specimen Description   Final    Urine Performed at [place] [address], [place], [place] [address]    Special Requests   Final    NONE Performed at [facility], [place] [address], [place], [place] [address]    Culture   Final    NO GROWTH Performed at [facility], [place],  [place] [address]    Report Status 05/22/2020 FINAL  Final      Radiology Studies: No results found.    Scheduled Meds: . cefdinir  300 mg Oral Q12H  . diltiazem  180 mg Oral Daily  . docusate sodium  100 mg Oral BID  . gabapentin  300 mg Oral QHS  . levothyroxine  50 mcg Oral Q0600  . multivitamin  1 tablet Oral Q lunch  . nadolol  80 mg Oral Daily  . pantoprazole  40 mg Oral Daily  . predniSONE  9 mg Oral Q lunch  . senna-docusate  1 tablet Oral QHS   Continuous Infusions:     Assessment/Plan:  1.  Large left hip intramuscular hematomas: Patient been receiving supportive care with hot compresses, PT, pain management with tramadol/Vicodin as needed.  Expectation is for spontaneous resolution with anticoagulation (Eliquis) on hold.  Hemoglobin being monitored closely-stable around 8.5 in the last few days (did drop to 7.6 past Friday).  Seen by orthopedics/trauma on presentation.  2.  Acute blood loss anemia, orthostasis:  In the setting of problem #1.Baseline hemoglobin 11 to 12 g/dL.  Patient has had a 3 g drop in hemoglobin from her baseline.  This appears stable, iron studies within normal limits.  Patient reported dizziness on standing yesterday, likely symptomatic anemia.  Noted to be significantly orthostatic with systolic blood pressure dropping to 80s when she stands up.  Will give small fluid bolus and adjust cardiac meds.  3. Paroxysmal atrial fibrillation-Hold Eliquis until hematoma is clearly resolving Continue nadolol, diltiazem-  4. History of provoked DVT-patient states she had remote history of DVT for which she was treated with anticoagulants for 6 months.  She was started on Eliquis in March when she was noted to be in A. fib during hospitalization.  Hold Eliquis for now.  5. Hypertension, now with orthostatic hypotension-Blood pressure ~systolic 528U to 132G in supine position but dropping to 80s on standing.Continue atenolol, diltiazem-although will reduce dose of diltiazem to 120 mg (from 180).  TED hose  6. Hypothyroidism-Continue levothyroxine  7.History of perforated diverticulitis-s/p partial colectomy and ostomy, Consulted WOCN  8. Polymyalgia rheumatica-Continue prednisone, pantoprazole  9.Polyneuropathy-Continue gabapentin    10.  Leukocytosis, thrombocytosis: Likely related to problem #1 (reactive) and being on steroids.  11.  Recurrent falls: Patient has had at least 3 falls this year.  Mostly related to legs giving away.  Although reports dizziness in this hospitalization and noted to be orthostatic, denies any dizzy spells at home and feels this is new.  Discussed with patient and son at bedside regarding risks and benefits of long-term anticoagulation given recurrent falls as well as current hospitalization with problem #1.  They understand stroke risk as well as bleeding risk and agreeable to holding off on anticoagulation until and unless patient is fall free for few months  and reevaluated by cardiology as outpatient.   DVT prophylaxis: Eliquis on hold, SCD Code Status: Full code Family / Patient Communication: Discussed with patient and with son extensively at bedside, all questions answered to satisfaction. Disposition Plan:   Status is: Inpatient  Remains inpatient appropriate because:Ongoing active pain requiring inpatient pain management   Dispo: The patient is from: Home              Anticipated d/c is to: SNF              Anticipated d/c date is: 2 days  Patient currently is not medically stable to d/c.    Time spent: 40 minutes   >50% time spent in discussions with care team and coordination of care.    Guilford Shi, MD [facility] Pager in [place]  If 7PM-7AM, please contact night-coverage [email or web address] 05/22/2020, 9:59 AM

## 2020-05-22 NOTE — TOC Initial Note (Addendum)
Transition of Care [facility]) - Initial/Assessment Note    Patient Details  Name: [name] MRN: [id number] Date of Birth: Jul 20, 1923  [facility] of [facility]) CM/SW Contact:    Ross Ludwig, LCSW Phone Number: 05/22/2020, 5:51 PM  Clinical Narrative:                  Patient is a 86 year old female who is alert and oriented x4.  Patient has been to inpatient rehab in the past and has also been at SNF in the past.  Patient and son are agreeable to going to SNF for short term rehab.  They would prefer [facility] if possible.  CSW informed son of process of trying to find placement.  CSW discussed how insurance pays for stay and what to expect at SNF.  Patient and son gave CSW permission to begin bed search in [place].  CSW faxed out required information for bed placement.  CSW awaiting for bed offers.  Patient is managed by Bernadene Bell CSW started insurance authorization, reference number is [id number].  Expected Discharge Plan: Skilled Nursing Facility Barriers to Discharge: Continued Medical Work up   Patient Goals and CMS Choice Patient states their goals for this hospitalization and ongoing recovery are:: To go to rehab, then return back home CMS Medicare.gov Compare Post Acute Care list provided to:: Patient Represenative (must comment) Choice offered to / list presented to : Adult Children  Expected Discharge Plan and Services Expected Discharge Plan: [place] arrangements for the past 2 months: Single Family Home Expected Discharge Date:  (unknown)                                    Prior Living Arrangements/Services Living arrangements for the past 2 months: Single Family Home Lives with:: Adult Children Patient language and need for interpreter reviewed:: Yes Do you feel safe going back to the place where you live?: No   Patient feels she needs rehab before returning back home.  Need for Family Participation in  Patient Care: Yes (Comment) Care giver support system in place?: No (comment)   Criminal Activity/Legal Involvement Pertinent to Current Situation/Hospitalization: No - Comment as needed  Activities of Daily Living Home Assistive Devices/Equipment: Eyeglasses, Gilford Rile (specify type) ADL Screening (condition at time of admission) Patient's cognitive ability adequate to safely complete daily activities?: Yes Is the patient deaf or have difficulty hearing?: No Does the patient have difficulty seeing, even when wearing glasses/contacts?: No Does the patient have difficulty concentrating, remembering, or making decisions?: No Patient able to express need for assistance with ADLs?: Yes Does the patient have difficulty dressing or bathing?: No Independently performs ADLs?: No Communication: Independent Dressing (OT): Independent Grooming: Independent Feeding: Independent Bathing: Independent Toileting: Needs assistance Is this a change from baseline?: Change from baseline, expected to last >3days In/Out Bed: Needs assistance Is this a change from baseline?: Change from baseline, expected to last >3 days Walks in Home: Needs assistance Is this a change from baseline?: Change from baseline, expected to last >3 days Does the patient have difficulty walking or climbing stairs?: Yes (secondary to left hip pain) Weakness of Legs: Left Weakness of Arms/Hands: None  Permission Sought/Granted   Permission granted to share information with : Yes, Verbal Permission Granted  Share Information with NAME: Kollyns, Mickelson [phone]  [phone] or [facility] Daughter [phone] or  [place], [place] [phone]  Permission granted to share info w AGENCY: SNF admissions        Emotional Assessment Appearance:: Appears stated age   Affect (typically observed): Appropriate, Calm, Accepting Orientation: : Oriented to Place, Oriented to  Time, Oriented to Situation,  Oriented to Self Alcohol / Substance Use: Not Applicable Psych Involvement: No (comment)  Admission diagnosis:  Acute blood loss anemia [D62] Hematoma [T14.8XXA] Fall [W19.XXXA] Thigh hematoma [S70.10XA] Patient Active Problem List   Diagnosis Date Noted  . Acute blood loss anemia 05/19/2020  . Traumatic hematoma of left hip 05/19/2020  . Current chronic use of systemic steroids 05/19/2020  . Thigh hematoma 05/19/2020  . Advanced age 50/12/2019  . Community acquired pneumonia   . Secondary hypercoagulable state ([place]) 03/08/2020  . Hypokalemia 02/15/2020  . Colostomy status ([place]) 01/26/2020  . Diverticulitis of colon with perforation 01/26/2020  . Anemia   . Debility 01/14/2020  . Physical debility 01/14/2020  . Trauma   . Urinary retention   . Persistent atrial fibrillation ([place])   . Closed fracture of multiple ribs 12/29/2019  . Closed fracture of left distal radius 12/29/2019  . Recurrent falls 12/29/2019  . Positive ANA (antinuclear antibody) 12/29/2019  . Osteoarthritis 11/13/2017  . Hypertriglyceridemia 01/29/2017  . Excessive gas 01/09/2017  . Elevated lipase 03/06/2016  . Chest pain 03/06/2016  . Osteopenia 02/08/2016  . Hypothyroidism 01/26/2016  . HTN (hypertension) 01/26/2016  . Left leg numbness 10/06/2012   PCP:  [name], MD Pharmacy:   [id number] - [place], [place] - [address] [place] [address] Phone: [phone] Fax: [phone]     Social Determinants of Health (SDOH) Interventions    Readmission Risk Interventions No flowsheet data found.

## 2020-05-22 NOTE — Care Management Important Message (Signed)
Important Message  Patient Details IM Letter presented to the Patient Name: [name] MRN: [id number] Date of Birth: 04-14-1923   Medicare Important Message Given:  Yes     Kerin Salen 05/22/2020, 10:58 AM

## 2020-05-23 ENCOUNTER — Ambulatory Visit: Payer: Medicare PPO | Admitting: Internal Medicine

## 2020-05-23 LAB — SARS CORONAVIRUS 2 BY RT PCR (HOSPITAL ORDER, PERFORMED IN ~~LOC~~ HOSPITAL LAB): SARS Coronavirus 2: NEGATIVE

## 2020-05-23 LAB — HEMOGLOBIN AND HEMATOCRIT, BLOOD
HCT: 27.8 % — ABNORMAL LOW (ref 36.0–46.0)
Hemoglobin: 8.7 g/dL — ABNORMAL LOW (ref 12.0–15.0)

## 2020-05-23 NOTE — Plan of Care (Signed)
  Problem: Clinical Measurements: Goal: Diagnostic test results will improve Outcome: Progressing Goal: Respiratory complications will improve Outcome: Progressing Goal: Cardiovascular complication will be avoided Outcome: Progressing   

## 2020-05-23 NOTE — Progress Notes (Signed)
Physical Therapy Treatment Patient Details Name: [name] MRN: [id number] DOB: 21-Apr-1923 Today's Date: 05/23/2020    History of Present Illness 86 yo female admitted to ED on 7/9 for persistent L hip and thigh pain s/p fall on 7/4. Imaging 7/4 and this admission negative for fracture, CT reveals extensive muscular hematoma. PMH includes DVT, GERD, HTN, melanoma, hypothyroid, PVD, afib, colostomy, history of multiple falls, lumbar decompression.    PT Comments    General Comments: AxO x 4 very pleasant, independent, motivated.  Pt lives home alone and was Indep.(driving/shopping) Assisted OOB to amb.  General transfer comment: increased effort from elevated surface pt was able to rise using R LE>L LE.  Slight balance deficit.  Apprehensive.  Fear of falling.  Used + 2 side by side assist initially to decrease pt's fear.  Ended up only needding + 1 assist. General Gait Details: Did require + 2 assist to amb using a walker with extra padded L hand grip on walker.  Unsteady esp with turns and back steps to recliner.  Decreased WBing thru L LE due to pain/instability.  Shaky. HIGH FALL RISK. Pt will need ST Rehab to regain her mobility prior to returning home alone.   Follow Up Recommendations  SNF;Supervision/Assistance - 24 hour     Equipment Recommendations  None recommended by PT    Recommendations for Other Services       Precautions / Restrictions Precautions Precautions: Fall Precaution Comments: colostomy Required Braces or Orthoses: Splint/Cast (L wrist splint) Splint/Cast: L wrist Restrictions Weight Bearing Restrictions: No Other Position/Activity Restrictions: WBAT per [place]hto MD    Mobility  Bed Mobility Overal bed mobility: Needs Assistance Bed Mobility: Supine to Sit     Supine to sit: Min assist     General bed mobility comments: assist L LE off bed due to pain and increased time with limited use L UE due to wrist injury(splint)  Transfers Overall  transfer level: Needs assistance Equipment used: Rolling walker (2 wheeled) Transfers: Sit to/from Stand Sit to Stand: Min assist;Mod assist         General transfer comment: increased effort from elevated surface pt was able to rise using R LE>L LE.  Slight balance deficit.  Apprehensive.  Fear of falling.  Used + 2 side by side assist initially to decrease pt's fear.  Ended up only needding + 1 assist.  Ambulation/Gait Ambulation/Gait assistance: Min assist;Mod assist Gait Distance (Feet): 24 Feet Assistive device: Rolling walker (2 wheeled) Gait Pattern/deviations: Step-to pattern;Decreased stance time - left Gait velocity: decreased   General Gait Details: Did require + 2 assist to amb using a walker with extra padded L hand grip on walker.  Unsteady esp with turns and back steps to recliner.  Decreased WBing thru L LE due to pain/instability.  Shaky. HIGH FALL RISK.   Stairs             Wheelchair Mobility    Modified Rankin (Stroke Patients Only)       Balance                                            Cognition   Behavior During Therapy: WFL for tasks assessed/performed Overall Cognitive Status: Within Functional Limits for tasks assessed  General Comments: AxO x 4 very pleasant, independent, motivated      Exercises      General Comments        Pertinent Vitals/Pain Pain Assessment: Faces Faces Pain Scale: Hurts little more Pain Location: L buttock/hip area Pain Descriptors / Indicators: Sore;Discomfort;Guarding;Grimacing Pain Intervention(s): Monitored during session    Home Living                      Prior Function            PT Goals (current goals can now be found in the care plan section) Progress towards PT goals: Progressing toward goals    Frequency    Min 2X/week      PT Plan Current plan remains appropriate    Co-evaluation               AM-PAC PT "6 Clicks" Mobility   Outcome Measure  Help needed turning from your back to your side while in a flat bed without using bedrails?: A Lot Help needed moving from lying on your back to sitting on the side of a flat bed without using bedrails?: A Lot Help needed moving to and from a bed to a chair (including a wheelchair)?: A Lot Help needed standing up from a chair using your arms (e.g., wheelchair or bedside chair)?: A Lot Help needed to walk in hospital room?: A Lot Help needed climbing 3-5 steps with a railing? : Total 6 Click Score: 11    End of Session Equipment Utilized During Treatment: Gait belt Activity Tolerance: No increased pain Patient left: in chair;with chair alarm set;with call bell/phone within reach;with family/visitor present Nurse Communication: Mobility status PT Visit Diagnosis: Other abnormalities of gait and mobility (R26.89);History of falling (Z91.81);Pain Pain - Right/Left: Left Pain - part of body: Leg;Hip     Time: 2683-4196 PT Time Calculation (min) (ACUTE ONLY): 25 min  Charges:  $Gait Training: 8-22 mins $Therapeutic Activity: 8-22 mins                     Rica Koyanagi  PTA Acute  Rehabilitation Services Pager      [phone] Office      [phone]

## 2020-05-23 NOTE — TOC Progression Note (Addendum)
Transition of Care [facility]) - Progression Note    Patient Details  Name: [name] MRN: [id number] Date of Birth: 1923-09-23  [facility] CM/SW [place], [place] Phone Number: 05/23/2020, 10:31 AM  Clinical Narrative:   Met with patient, son to go over bed offers.  [facility] is out due to past experience.  They remain invested in [facility] even though they were declined due to insurance being out of network.  I spoke with Judson Roch at [facility], asked her to review for possible admission and then call son if they feel she is appropriate. TOC standing by.  Addendum: [facility] declined.  Second choice is [facility].  I reached out to [facility] who confirmed availability of bed tomorrow.  Have insurance authorization for 7/14-7/16.  Reference #[id number].  Need COVID test, which has been ordered.    Expected Discharge Plan: [place] Barriers to Discharge: Continued Medical Work up  Expected Discharge Plan and Services Expected Discharge Plan: [place] arrangements for the past 2 months: Single Family Home Expected Discharge Date:  (unknown)                                     Social Determinants of Health (SDOH) Interventions    Readmission Risk Interventions No flowsheet data found.

## 2020-05-23 NOTE — Progress Notes (Addendum)
PROGRESS NOTE    [name]  TKZ:[id number]  DOB: 04-17-1923  PCP: [name], MD Admit date:05/19/2020 Chief compliant: Left hip pain and swelling Hospital course: 86 y.o. F with Afib and hx DVT on Eliquis, HTN, hypothyroidism, hx perforated diverticulitis s/p partial colectomy and ostomy, as well as PMR still on chronic steroids who presented with left hip pain and swelling. Evidently the patient had had a muscle tear recently.  She was admitted to the hospital earlier this month for pneumonia, discharged home, where she fell.  Since then she has had left hip pain which has been getting progressively worse.  She had a CT scan on 7/4 that showed no fracture.Pain and bruising and swelling in her left hip were getting to be severe and the pain was preventing her from moving at all and was excruciating.  She came to the ER where CT showed multiple large areas of intramuscular hematomas without active extravasation.Per radiology, mixed age blood products including acute on more subacute hemorrhage noted which can reflect sequela complex high-grade tearing, though underlying musculotendinous integrity is difficult to fully assess on CT and in the setting of such extensive hemorrhages.Hemoglobin down to 8.4 from 11.9, 1week ago.  Subjective:  Patient reports feeling improved today, able to walk with less dizziness.  Shows improvement in orthostatic check with supine BP 137/80, sitting BP 118/61 and standing 102/60.  Objective: Vitals:   05/23/20 0415 05/23/20 1113 05/23/20 1133 05/23/20 1350  BP: 138/67 (!) 118/51 (!) 113/100 134/64  Pulse: 70 74 69 72  Resp: 19   18  Temp: 98.2 F (36.8 C)   98 F (36.7 C)  TempSrc:    Oral  SpO2: 92%   94%  Weight:      Height:        Intake/Output Summary (Last 24 hours) at 05/23/2020 1502 Last data filed at 05/23/2020 1213 Gross per 24 hour  Intake 480 ml  Output 2200 ml  Net -1720 ml   Filed Weights   05/19/20 0011  Weight: 70.3 kg     Physical Examination: General: Moderately built, no acute distress noted Head ENT: Atraumatic normocephalic, PERRLA, neck supple Heart: S1-S2 heard, regular rate and rhythm, no murmurs.  No leg edema noted Lungs: Equal air entry bilaterally, no rhonchi or rales on exam, no accessory muscle use Abdomen: Ileostomy bag, bowel sounds heard, soft, nontender, nondistended.  Extremities: Large left hip hematoma, no significant edema Neurological: Awake alert oriented x3, no focal weakness or numbness, strength and sensations to crude touch intact Skin: Extensive bruising/ecchymosis noted along left hip/lateral thigh area    Data Reviewed: I have personally reviewed following labs and imaging studies  CBC: Recent Labs  Lab 05/19/20 0030 05/19/20 0030 05/19/20 1057 05/20/20 0341 05/21/20 0752 05/22/20 0413 05/23/20 0358  WBC 15.8*  --   --  14.3* 13.9* 14.9*  --   NEUTROABS 12.2*  --   --   --   --   --   --   HGB 8.6*   < > 7.6* 8.2* 8.6* 8.4* 8.7*  HCT 27.4*   < > 24.6* 27.2* 27.9* 27.3* 27.8*  MCV 96.1  --   --  97.1 96.5 97.2  --   PLT 360  --   --  405* 427* 436*  --    < > = values in this interval not displayed.   Basic Metabolic Panel: Recent Labs  Lab 05/19/20 0030 05/20/20 0341  NA 139 135  K 4.7  4.5  CL 104 104  CO2 26 27  GLUCOSE 143* 105*  BUN 20 23  CREATININE 0.94 0.94  CALCIUM 8.7* 8.5*   GFR: Estimated Creatinine Clearance: 33.5 mL/min (by C-G formula based on SCr of 0.94 mg/dL). Liver Function Tests: Recent Labs  Lab 05/19/20 0030  AST 18  ALT 24  ALKPHOS 80  BILITOT 1.1  PROT 5.6*  ALBUMIN 2.9*   No results for input(s): LIPASE, AMYLASE in the last 168 hours. No results for input(s): AMMONIA in the last 168 hours. Coagulation Profile: Recent Labs  Lab 05/19/20 0030  INR 1.3*   Cardiac Enzymes: No results for input(s): CKTOTAL, CKMB, CKMBINDEX, TROPONINI in the last 168 hours. BNP (last 3 results) No results for input(s): PROBNP in  the last 8760 hours. HbA1C: No results for input(s): HGBA1C in the last 72 hours. CBG: No results for input(s): GLUCAP in the last 168 hours. Lipid Profile: No results for input(s): CHOL, HDL, LDLCALC, TRIG, CHOLHDL, LDLDIRECT in the last 72 hours. Thyroid Function Tests: No results for input(s): TSH, T4TOTAL, FREET4, T3FREE, THYROIDAB in the last 72 hours. Anemia Panel: Recent Labs    05/20/20 1508  FERRITIN 77  TIBC 265  IRON 44   Sepsis Labs: No results for input(s): PROCALCITON, LATICACIDVEN in the last 168 hours.  Recent Results (from the past 240 hour(s))  SARS Coronavirus 2 by RT PCR (hospital order, performed in [facility] hospital lab) Nasopharyngeal Nasopharyngeal Swab     Status: None   Collection Time: 05/19/20  4:53 AM   Specimen: Nasopharyngeal Swab  Result Value Ref Range Status   SARS Coronavirus 2 NEGATIVE NEGATIVE Final    Comment: (NOTE) SARS-CoV-2 target nucleic acids are NOT DETECTED.  The SARS-CoV-2 RNA is generally detectable in upper and lower respiratory specimens during the acute phase of infection. The lowest concentration of SARS-CoV-2 viral copies this assay can detect is 250 copies / mL. A negative result does not preclude SARS-CoV-2 infection and should not be used as the sole basis for treatment or other patient management decisions.  A negative result may occur with improper specimen collection / handling, submission of specimen other than nasopharyngeal swab, presence of viral mutation(s) within the areas targeted by this assay, and inadequate number of viral copies (<250 copies / mL). A negative result must be combined with clinical observations, patient history, and epidemiological information.  Fact Sheet for Patients:   StrictlyIdeas.no  Fact Sheet for Healthcare Providers: BankingDealers.co.za  This test is not yet approved or  cleared by the [place] FDA and has been authorized  for detection and/or diagnosis of SARS-CoV-2 by FDA under an Emergency Use Authorization (EUA).  This EUA will remain in effect (meaning this test can be used) for the duration of the COVID-19 declaration under Section 564(b)(1) of the Act, 21 U.S.C. section 360bbb-3(b)(1), unless the authorization is terminated or revoked sooner.  Performed at [facility], [place] [address] [place], [place] [address]   Culture, Urine     Status: None   Collection Time: 05/21/20  9:27 AM   Specimen: Urine, Catheterized  Result Value Ref Range Status   Specimen Description   Final    Urine Performed at [place] [address] [place], [place] [address]    Special Requests   Final    NONE Performed at [facility], [place] [address], [place],  [address]    Culture   Final    NO GROWTH Performed  at [facility], [place] [address] [place], [place] [address]    Report Status 05/22/2020 FINAL  Final      Radiology Studies: No results found.    Scheduled Meds: . cefdinir  300 mg Oral Q12H  . Chlorhexidine Gluconate Cloth  6 each Topical Daily  . diltiazem  120 mg Oral Daily  . docusate sodium  100 mg Oral BID  . gabapentin  300 mg Oral QHS  . levothyroxine  50 mcg Oral Q0600  . multivitamin  1 tablet Oral Q lunch  . nadolol  80 mg Oral Daily  . pantoprazole  40 mg Oral Daily  . predniSONE  9 mg Oral Q lunch  . senna-docusate  1 tablet Oral QHS   Continuous Infusions:     Assessment/Plan:  1.  Large left hip intramuscular hematomas: Patient been receiving supportive care with hot compresses, PT, pain management with tramadol/Vicodin as needed.  Expectation is for spontaneous resolution with anticoagulation (Eliquis) on hold.  Hemoglobin being monitored closely-stable around 8.5 in the last few days (did drop to 7.6 past Friday).  Seen by orthopedics/trauma on presentation.  2.  Acute blood loss anemia, orthostasis:  In the setting of problem #1.Baseline hemoglobin 11 to 12 g/dL.  Patient has had a 3 g drop in hemoglobin from her baseline.  This appears stable around 8.5, iron studies within normal limits.  Patient reported dizziness on standing yesterday,Noted to be significantly orthostatic with systolic blood pressure dropping to 80s when she stands up.  Improved with TED hose, small fluid bolus and reduction in cardiac med dosages.  3. Paroxysmal atrial fibrillation-Hold Eliquis until hematoma is clearly resolving Continue nadolol, diltiazem-dose reduced  4. History of provoked DVT-patient states she had remote history of DVT for which she was treated with anticoagulants for 6 months.  She was started on Eliquis in March when she was noted to be in A. fib during hospitalization.  Hold Eliquis for now.  5. Hypertension, now with orthostatic hypotension-Blood pressure ~systolic 726O to 035D in supine position but dropping to 80s on standing.Continue atenolol, diltiazem-although will reduce dose of diltiazem to 120 mg (from 180).  TED hose  6. Hypothyroidism-Continue levothyroxine  7.History of perforated diverticulitis-s/p partial colectomy and ostomy, Consulted [facility]  8. Polymyalgia rheumatica-Continue prednisone, pantoprazole  9.Polyneuropathy-Continue gabapentin    10.  Leukocytosis, thrombocytosis: Likely related to problem #1 (reactive) and being on steroids.  11.  Recurrent falls: Patient has had at least 3 falls this year.  Mostly related to legs giving away.  Although reports dizziness in this hospitalization and noted to be orthostatic, denies any dizzy spells at home and feels this is new.  Discussed with patient and son at bedside regarding risks and benefits of long-term anticoagulation given recurrent falls as well as current hospitalization with problem #1.  They understand stroke risk as well as bleeding risk and agreeable to holding off on anticoagulation until and unless patient is fall  free for few months and reevaluated by cardiology as outpatient.   DVT prophylaxis: Eliquis on hold, SCD Code Status: Full code Family / Patient Communication: Discussed with patient and with son extensively at bedside, all questions answered to satisfaction. Disposition Plan:   Status is: Inpatient  Remains inpatient appropriate because:Ongoing active pain requiring inpatient pain management   Dispo: The patient is from: Home              Anticipated d/c is to: SNF  Anticipated d/c date is: 1-2 days              Patient currently is medically stable to d/c.  Covid screen sent today    Time spent: 25 minutes   >50% time spent in discussions with care team and coordination of care.    Guilford Shi, MD [facility] Pager in [place]  If 7PM-7AM, please contact night-coverage [email or web address] 05/23/2020, 3:02 PM

## 2020-05-24 DIAGNOSIS — R338 Other retention of urine: Secondary | ICD-10-CM

## 2020-05-24 DIAGNOSIS — D649 Anemia, unspecified: Secondary | ICD-10-CM | POA: Diagnosis not present

## 2020-05-24 DIAGNOSIS — D72829 Elevated white blood cell count, unspecified: Secondary | ICD-10-CM | POA: Diagnosis not present

## 2020-05-24 DIAGNOSIS — I951 Orthostatic hypotension: Secondary | ICD-10-CM

## 2020-05-24 DIAGNOSIS — Z7952 Long term (current) use of systemic steroids: Secondary | ICD-10-CM | POA: Diagnosis not present

## 2020-05-24 DIAGNOSIS — I959 Hypotension, unspecified: Secondary | ICD-10-CM | POA: Diagnosis not present

## 2020-05-24 DIAGNOSIS — R2689 Other abnormalities of gait and mobility: Secondary | ICD-10-CM | POA: Diagnosis not present

## 2020-05-24 DIAGNOSIS — R2681 Unsteadiness on feet: Secondary | ICD-10-CM | POA: Diagnosis not present

## 2020-05-24 DIAGNOSIS — R0902 Hypoxemia: Secondary | ICD-10-CM | POA: Diagnosis not present

## 2020-05-24 DIAGNOSIS — I480 Paroxysmal atrial fibrillation: Secondary | ICD-10-CM | POA: Diagnosis not present

## 2020-05-24 DIAGNOSIS — D6869 Other thrombophilia: Secondary | ICD-10-CM | POA: Diagnosis not present

## 2020-05-24 DIAGNOSIS — Y92009 Unspecified place in unspecified non-institutional (private) residence as the place of occurrence of the external cause: Secondary | ICD-10-CM

## 2020-05-24 DIAGNOSIS — K9400 Colostomy complication, unspecified: Secondary | ICD-10-CM | POA: Diagnosis not present

## 2020-05-24 DIAGNOSIS — K5901 Slow transit constipation: Secondary | ICD-10-CM | POA: Diagnosis not present

## 2020-05-24 DIAGNOSIS — Z743 Need for continuous supervision: Secondary | ICD-10-CM | POA: Diagnosis not present

## 2020-05-24 DIAGNOSIS — W19XXXA Unspecified fall, initial encounter: Secondary | ICD-10-CM | POA: Diagnosis not present

## 2020-05-24 DIAGNOSIS — I10 Essential (primary) hypertension: Secondary | ICD-10-CM | POA: Diagnosis not present

## 2020-05-24 DIAGNOSIS — R279 Unspecified lack of coordination: Secondary | ICD-10-CM | POA: Diagnosis not present

## 2020-05-24 DIAGNOSIS — D473 Essential (hemorrhagic) thrombocythemia: Secondary | ICD-10-CM | POA: Diagnosis not present

## 2020-05-24 DIAGNOSIS — S7002XD Contusion of left hip, subsequent encounter: Secondary | ICD-10-CM | POA: Diagnosis not present

## 2020-05-24 DIAGNOSIS — Z86718 Personal history of other venous thrombosis and embolism: Secondary | ICD-10-CM | POA: Diagnosis not present

## 2020-05-24 DIAGNOSIS — S7010XA Contusion of unspecified thigh, initial encounter: Secondary | ICD-10-CM | POA: Diagnosis not present

## 2020-05-24 DIAGNOSIS — S7002XA Contusion of left hip, initial encounter: Secondary | ICD-10-CM | POA: Diagnosis not present

## 2020-05-24 DIAGNOSIS — R262 Difficulty in walking, not elsewhere classified: Secondary | ICD-10-CM | POA: Diagnosis not present

## 2020-05-24 DIAGNOSIS — M353 Polymyalgia rheumatica: Secondary | ICD-10-CM | POA: Diagnosis not present

## 2020-05-24 DIAGNOSIS — M6259 Muscle wasting and atrophy, not elsewhere classified, multiple sites: Secondary | ICD-10-CM | POA: Diagnosis not present

## 2020-05-24 DIAGNOSIS — M6281 Muscle weakness (generalized): Secondary | ICD-10-CM | POA: Diagnosis not present

## 2020-05-24 DIAGNOSIS — G6289 Other specified polyneuropathies: Secondary | ICD-10-CM | POA: Diagnosis not present

## 2020-05-24 DIAGNOSIS — Z9181 History of falling: Secondary | ICD-10-CM | POA: Diagnosis not present

## 2020-05-24 DIAGNOSIS — I4819 Other persistent atrial fibrillation: Secondary | ICD-10-CM | POA: Diagnosis not present

## 2020-05-24 DIAGNOSIS — D6489 Other specified anemias: Secondary | ICD-10-CM | POA: Diagnosis not present

## 2020-05-24 DIAGNOSIS — D62 Acute posthemorrhagic anemia: Secondary | ICD-10-CM | POA: Diagnosis not present

## 2020-05-24 LAB — CBC
HCT: 27.5 % — ABNORMAL LOW (ref 36.0–46.0)
Hemoglobin: 8.4 g/dL — ABNORMAL LOW (ref 12.0–15.0)
MCH: 29.6 pg (ref 26.0–34.0)
MCHC: 30.5 g/dL (ref 30.0–36.0)
MCV: 96.8 fL (ref 80.0–100.0)
Platelets: 435 10*3/uL — ABNORMAL HIGH (ref 150–400)
RBC: 2.84 MIL/uL — ABNORMAL LOW (ref 3.87–5.11)
RDW: 14.8 % (ref 11.5–15.5)
WBC: 15.1 10*3/uL — ABNORMAL HIGH (ref 4.0–10.5)
nRBC: 0.1 % (ref 0.0–0.2)

## 2020-05-24 MED ORDER — HYDROCODONE-ACETAMINOPHEN 5-325 MG PO TABS: 1.0000 | ORAL_TABLET | Freq: Four times a day (QID) | ORAL | 0 refills | Status: AC | PRN

## 2020-05-24 MED ORDER — DILTIAZEM HCL ER 120 MG PO CP24: 120.0000 mg | ORAL_CAPSULE | Freq: Every day | ORAL | Status: DC

## 2020-05-24 MED ORDER — CEFDINIR 300 MG PO CAPS: 300.0000 mg | ORAL_CAPSULE | Freq: Two times a day (BID) | ORAL | 0 refills | Status: AC

## 2020-05-24 NOTE — Progress Notes (Signed)
[facility] and gave report to Quillian Quince the nurse that will be taking over patient care once she is at [facility].

## 2020-05-24 NOTE — TOC Transition Note (Signed)
Transition of Care [facility]) - CM/SW Discharge Note   Patient Details  Name: MADAILEIN LONDO MRN: [id number] Date of Birth: 1922-12-08  Transition of Care [facility]) CM/SW Contact:  Trish Mage, LCSW Phone Number: 05/24/2020, 9:55 AM   Clinical Narrative:  Patient to transfer to [facility] today.  Family alerted.  PTAR arranged. Nursing, please call report to [phone], room 705P. TOC sign off.     Final next level of care: Skilled Nursing Facility Barriers to Discharge: Barriers Resolved   Patient Goals and CMS Choice Patient states their goals for this hospitalization and ongoing recovery are:: To go to rehab, then return back home CMS Medicare.gov Compare Post Acute Care list provided to:: Patient Represenative (must comment) Choice offered to / list presented to : Adult Children  Discharge Placement                       Discharge Plan and Services                                     Social Determinants of Health (SDOH) Interventions     Readmission Risk Interventions No flowsheet data found.

## 2020-05-24 NOTE — Discharge Summary (Addendum)
Physician Discharge Summary  KHADEEJA ELDEN YKD:[id number] DOB: 1923/06/09 DOA: 05/19/2020  PCP: [name], MD  Admit date: 05/19/2020 Discharge date: 05/24/2020 Consultations: Orthopedics, wound care  Admitted From: home Disposition: SNF  Discharge Diagnoses:  Principal Problem:   Traumatic hematoma of left hip Active Problems:   Acute blood loss anemia   Fall at home, initial encounter   Secondary hypercoagulable state ([place])   Current chronic use of systemic steroids   HTN (hypertension)   Closed fracture of left distal radius   Acute urinary retention   Persistent atrial fibrillation (HCC)   Thigh hematoma   Orthostatic hypotension   Hospital Course Summary:  86 y.o.F with Afib and hx DVT on Eliquis, HTN, hypothyroidism, hx perforated diverticulitis s/p partial colectomy and ostomy, as well as PMR still on chronic steroids who presented with left hip pain and swelling. Patient has had recurrent falls since early this year. Fell in March resulting in left wrist/distal radius fracture. S//p ORIF .Has a brace in place.   Mostly related to legs giving away. She was admitted to the hospital earlier this month for pneumonia, discharged home, where she fell again. Since then she has had left hip pain which has been getting progressively worse. She had a CT scan on 7/4 that showed no fracture.Pain and bruising and swelling in her left hip were getting to be severe and the pain was preventing her from moving at all and was excruciating. She came to the ER where CT showed multiple large areas of intramuscular hematomas without active extravasation.Per radiology, mixed age blood products including acute on more subacute hemorrhage noted which can reflect sequela complex high-grade tearing, though underlying musculotendinous integrity is difficult to fully assess on CT and in the setting of such extensive hemorrhages.Hemoglobin down to 8.4 from 11.9, 1week ago.  1.  Large left hip  intramuscular hematomas: Patient been receiving supportive care with hot compresses, PT, pain management with tramadol/Vicodin as needed.  Expectation is for spontaneous resolution with anticoagulation (Eliquis) on hold.  Hemoglobin being monitored closely-stable around 8.5 in the last few days (did drop to 7.6 past Friday).  Seen by orthopedics/trauma on presentation.  2.  Acute blood loss anemia, orthostasis: In the setting of problem #1.Baseline hemoglobin 11 to 12 g/dL. Patient has had a 3 g drop in hemoglobin from her baseline. This appears stable around 8.5, iron studies within normal limits.  Patient reported dizziness on standing yesterday,Noted to be significantly orthostatic with systolic blood pressure dropping to 80s when she stands up.  Improved with TED hose, small fluid bolus and reduction in cardiac med dosages.  3. Paroxysmal atrial fibrillation-Holding Eliquis until Cardiology follow up,  hematoma is clearly resolving. Continue nadolol, diltiazem-dose reduced  4. History of provoked DVT-patient states she had remote history of DVT for which she was treated with anticoagulants for 6 months.  She was started on Eliquis in March when she was noted to be in A. fib during hospitalization.  Holding Eliquis for now.  5. Hypertension, now with orthostatic hypotension-Blood pressure ~systolic 397Q to 734L in supine position but dropping to 80s on standing.Continue atenolol, diltiazem-although will reduce dose of diltiazem to 120 mg (from 180).  TED hose  6. Hypothyroidism-Continue levothyroxine  7.History of perforated diverticulitis-s/p partial colectomy and ostomy,  Pt had colostomy surgery 84/25/21 and states she is able to open and empty without assistance prior to admission. Her daughter performs pouch changes. Seen by wound care on 7/9 , barrier ring and single pouch  changed, extra supplies provided. Stoma looked good   8. Recurrent falls: Patient has had at least 3 falls this  year. Fell in March resulting in left wrist/distal radius fracture. S//p ORIF .Has a brace in place.   Mostly related to legs giving away.  Although reports dizziness in this hospitalization and noted to be orthostatic, denies any dizzy spells at home and feels this is new.  Discussed with patient and son at bedside regarding risks and benefits of long-term anticoagulation given recurrent falls as well as current hospitalization with problem #1.  They understand stroke risk as well as bleeding risk and agreeable to holding off on anticoagulation until and unless patient is fall free for few months and reevaluated by cardiology as outpatient.   9.Polyneuropathy-Continue gabapentin  10.  Leukocytosis, thrombocytosis: Likely related to problem #1 (reactive) and being on steroids.  11. Polymyalgia rheumatica-Continue prednisone 9 mg , pantoprazole   12. Urinary retention: Patient required straight cath x 2 over the weekend and again on Monday , indwelling Foley catheter was placed 7/12 -likely related to hip injury and pain meds. Advise voiding trial at SNF in 1 week (next Monday) .      Discharge Exam:   Vitals:   05/23/20 1350 05/23/20 1959 05/24/20 0409 05/24/20 0928  BP: 134/64 129/66 (!) 148/69 (!) 155/75  Pulse: 72 72 72 75  Resp: 18 19 19    Temp: 98 F (36.7 C) 98.3 F (36.8 C) 98.2 F (36.8 C)   TempSrc: Oral     SpO2: 94% 94% 92%   Weight:      Height:        General: Pt is alert, awake, not in acute distress Cardiovascular: RRR, S1/S2 +, no rubs, no gallops Respiratory: CTA bilaterally, no wheezing, no rhonchi Abdominal:  Colostomy in place. Soft, NT, ND, bowel sounds + Extremities: no edema, no cyanosis but has extensive ecchymosis along the left hip, thumb brace//splint in place along left wrist  Discharge Condition:Stable CODE STATUS: Full  Diet recommendation: Heart healthy Recommendations for Outpatient Follow-up:  1. Follow up with PCP: at SNF 2. Follow up  with consultants: Cardiology Dr Caryl Comes in 2 weeks, orthopedics Dr Apolonio Schneiders in 2 weeks 3. Please obtain follow up labs including: CBC/BMP in 3 days, voiding trial in a week    Discharge Instructions:  Discharge Instructions    Call MD for:  difficulty breathing, headache or visual disturbances   Complete by: As directed    Call MD for:  extreme fatigue   Complete by: As directed    Call MD for:  persistant dizziness or light-headedness   Complete by: As directed    Call MD for:  persistant nausea and vomiting   Complete by: As directed    Call MD for:  severe uncontrolled pain   Complete by: As directed    Call MD for:  temperature >100.4   Complete by: As directed    Diet - low sodium heart healthy   Complete by: As directed    Increase activity slowly   Complete by: As directed      Allergies as of 05/24/2020      Reactions   Codeine Other (See Comments)   Nightmares, imagining things   Keflex [cephalexin] Nausea Only   Pt ended up in ER w/ CP      Medication List    STOP taking these medications   Eliquis 5 MG Tabs tablet Generic drug: apixaban     TAKE these medications  acetaminophen 325 MG tablet Commonly known as: TYLENOL Take 1-2 tablets (325-650 mg total) by mouth every 4 (four) hours as needed for mild pain.   cefdinir 300 MG capsule Commonly known as: OMNICEF Take 1 capsule (300 mg total) by mouth every 12 (twelve) hours for 2 days.   cyclobenzaprine 5 MG tablet Commonly known as: FLEXERIL Take 1-2 tablets (5-10 mg total) by mouth 3 (three) times daily as needed for muscle spasms.   diltiazem 120 MG 24 hr capsule Commonly known as: DILACOR XR Take 1 capsule (120 mg total) by mouth daily. What changed:   medication strength  how much to take   docusate sodium 100 MG capsule Commonly known as: COLACE Take 1 capsule (100 mg total) by mouth 2 (two) times daily.   gabapentin 300 MG capsule Commonly known as: NEURONTIN Take 300 mg by mouth at  bedtime.   HYDROcodone-acetaminophen 5-325 MG tablet Commonly known as: NORCO/VICODIN Take 1-2 tablets by mouth every 6 (six) hours as needed for up to 3 days for moderate pain.   levothyroxine 50 MCG tablet Commonly known as: SYNTHROID TAKE 1 TABLET BY MOUTH EVERY DAY What changed: when to take this   nadolol 80 MG tablet Commonly known as: Corgard Take 1 tablet (80 mg total) by mouth daily.   ondansetron 4 MG tablet Commonly known as: Zofran Take 1 tablet (4 mg total) by mouth every 8 (eight) hours as needed for nausea or vomiting.   pantoprazole 40 MG tablet Commonly known as: PROTONIX Take 1 tablet (40 mg total) by mouth daily.   predniSONE 5 MG tablet Commonly known as: DELTASONE Take 5 mg by mouth daily with lunch. Take with prednisone 1 mg x 4 for a total of 9mg    predniSONE 1 MG tablet Commonly known as: DELTASONE Take 4 mg by mouth daily. Take with the prednisone 5mg  for a total of 9mg    PreserVision AREDS 2 Caps Take 1 capsule by mouth daily with lunch.   senna-docusate 8.6-50 MG tablet Commonly known as: Senokot-S Take 1 tablet by mouth at bedtime.       Contact information for after-discharge care    Destination    [facility] .   Service: Skilled Nursing Contact information: [place] [id number] [phone]                 Allergies  Allergen Reactions  . Codeine Other (See Comments)    Nightmares, imagining things  . Keflex [Cephalexin] Nausea Only    Pt ended up in ER w/ CP      The results of significant diagnostics from this hospitalization (including imaging, microbiology, ancillary and laboratory) are listed below for reference.    Labs: BNP (last 3 results) Recent Labs    05/12/20 0435  BNP 259.5*   Basic Metabolic Panel: Recent Labs  Lab 05/19/20 0030 05/20/20 0341  NA 139 135  K 4.7 4.5  CL 104 104  CO2 26 27  GLUCOSE 143* 105*  BUN 20 23  CREATININE 0.94 0.94   CALCIUM 8.7* 8.5*   Liver Function Tests: Recent Labs  Lab 05/19/20 0030  AST 18  ALT 24  ALKPHOS 80  BILITOT 1.1  PROT 5.6*  ALBUMIN 2.9*   No results for input(s): LIPASE, AMYLASE in the last 168 hours. No results for input(s): AMMONIA in the last 168 hours. CBC: Recent Labs  Lab 05/19/20 0030 05/19/20 1057 05/20/20 0341 05/21/20 0752 05/22/20 0413 05/23/20 0358 05/24/20  0401  WBC 15.8*  --  14.3* 13.9* 14.9*  --  15.1*  NEUTROABS 12.2*  --   --   --   --   --   --   HGB 8.6*   < > 8.2* 8.6* 8.4* 8.7* 8.4*  HCT 27.4*   < > 27.2* 27.9* 27.3* 27.8* 27.5*  MCV 96.1  --  97.1 96.5 97.2  --  96.8  PLT 360  --  405* 427* 436*  --  435*   < > = values in this interval not displayed.   Cardiac Enzymes: No results for input(s): CKTOTAL, CKMB, CKMBINDEX, TROPONINI in the last 168 hours. BNP: Invalid input(s): POCBNP CBG: No results for input(s): GLUCAP in the last 168 hours. D-Dimer No results for input(s): DDIMER in the last 72 hours. Hgb A1c No results for input(s): HGBA1C in the last 72 hours. Lipid Profile No results for input(s): CHOL, HDL, LDLCALC, TRIG, CHOLHDL, LDLDIRECT in the last 72 hours. Thyroid function studies No results for input(s): TSH, T4TOTAL, T3FREE, THYROIDAB in the last 72 hours.  Invalid input(s): FREET3 Anemia work up No results for input(s): VITAMINB12, FOLATE, FERRITIN, TIBC, IRON, RETICCTPCT in the last 72 hours. Urinalysis    Component Value Date/Time   COLORURINE YELLOW 05/21/2020 0927   APPEARANCEUR CLEAR 05/21/2020 0927   LABSPEC 1.009 05/21/2020 0927   PHURINE 6.0 05/21/2020 0927   GLUCOSEU NEGATIVE 05/21/2020 0927   GLUCOSEU NEGATIVE 02/22/2016 0926   HGBUR NEGATIVE 05/21/2020 0927   BILIRUBINUR NEGATIVE 05/21/2020 0927   BILIRUBINUR Negative 02/03/2020 1548   KETONESUR NEGATIVE 05/21/2020 0927   PROTEINUR NEGATIVE 05/21/2020 0927   UROBILINOGEN 0.2 02/03/2020 1548   UROBILINOGEN 0.2 02/22/2016 0926   NITRITE NEGATIVE  05/21/2020 0927   LEUKOCYTESUR NEGATIVE 05/21/2020 8889   Sepsis Labs Invalid input(s): PROCALCITONIN,  WBC,  LACTICIDVEN Microbiology Recent Results (from the past 240 hour(s))  SARS Coronavirus 2 by RT PCR (hospital order, performed in [place] hospital lab) Nasopharyngeal Nasopharyngeal Swab     Status: None   Collection Time: 05/19/20  4:53 AM   Specimen: Nasopharyngeal Swab  Result Value Ref Range Status   SARS Coronavirus 2 NEGATIVE NEGATIVE Final    Comment: (NOTE) SARS-CoV-2 target nucleic acids are NOT DETECTED.  The SARS-CoV-2 RNA is generally detectable in upper and lower respiratory specimens during the acute phase of infection. The lowest concentration of SARS-CoV-2 viral copies this assay can detect is 250 copies / mL. A negative result does not preclude SARS-CoV-2 infection and should not be used as the sole basis for treatment or other patient management decisions.  A negative result may occur with improper specimen collection / handling, submission of specimen other than nasopharyngeal swab, presence of viral mutation(s) within the areas targeted by this assay, and inadequate number of viral copies (<250 copies / mL). A negative result must be combined with clinical observations, patient history, and epidemiological information.  Fact Sheet for Patients:   StrictlyIdeas.no  Fact Sheet for Healthcare Providers: BankingDealers.co.za  This test is not yet approved or  cleared by the [place] FDA and has been authorized for detection and/or diagnosis of SARS-CoV-2 by FDA under an Emergency Use Authorization (EUA).  This EUA will remain in effect (meaning this test can be used) for the duration of the COVID-19 declaration under Section 564(b)(1) of the Act, 21 U.S.C. section 360bbb-3(b)(1), unless the authorization is terminated or revoked sooner.  Performed at [facility], [place] [address] [place], [place] [address]  Culture, Urine     Status: None   Collection Time: 05/21/20  9:27 AM   Specimen: Urine, Catheterized  Result Value Ref Range Status   Specimen Description   Final    Urine Performed at [place] [address] [place], [place] [address]    Special Requests   Final    NONE Performed at [facility], [place] [address] [place], [place] [address]    Culture   Final    NO GROWTH Performed at [facility], [place] [address], [place],  [address]    Report Status 05/22/2020 FINAL  Final  SARS Coronavirus 2 by RT PCR (hospital order, performed in [facility] hospital lab) Nasopharyngeal Nasopharyngeal Swab     Status: None   Collection Time: 05/23/20  6:17 PM   Specimen: Nasopharyngeal Swab  Result Value Ref Range Status   SARS Coronavirus 2 NEGATIVE NEGATIVE Final    Comment: (NOTE) SARS-CoV-2 target nucleic acids are NOT DETECTED.  The SARS-CoV-2 RNA is generally detectable in upper and lower respiratory specimens during the acute phase of infection. The lowest concentration of SARS-CoV-2 viral copies this assay can detect is 250 copies / mL. A negative result does not preclude SARS-CoV-2 infection and should not be used as the sole basis for treatment or other patient management decisions.  A negative result may occur with improper specimen collection / handling, submission of specimen other than nasopharyngeal swab, presence of viral mutation(s) within the areas targeted by this assay, and inadequate number of viral copies (<250 copies / mL). A negative result must be combined with clinical observations, patient history, and epidemiological information.  Fact Sheet for Patients:   StrictlyIdeas.no  Fact Sheet for Healthcare Providers: BankingDealers.co.za  This test is not yet approved or  cleared by the [place] FDA and has been authorized for  detection and/or diagnosis of SARS-CoV-2 by FDA under an Emergency Use Authorization (EUA).  This EUA will remain in effect (meaning this test can be used) for the duration of the COVID-19 declaration under Section 564(b)(1) of the Act, 21 U.S.C. section 360bbb-3(b)(1), unless the authorization is terminated or revoked sooner.  Performed at [facility], [place] [address], [place],  [address]     Procedures/Studies: DG Chest 2 View  Result Date: 05/11/2020 CLINICAL DATA:  Chest pain. EXAM: CHEST - 2 VIEW COMPARISON:  January 11, 2020. FINDINGS: Stable cardiomegaly. No pneumothorax or pleural effusion is noted. Old left rib fractures are noted. Lungs are clear. IMPRESSION: No active cardiopulmonary disease. Aortic Atherosclerosis (ICD10-I70.0). Electronically Signed   By: Marijo Conception M.D.   On: 05/11/2020 09:20   DG Wrist Complete Left  Result Date: 05/14/2020 CLINICAL DATA:  Status post fall with pain. EXAM: LEFT WRIST - COMPLETE 3+ VIEW COMPARISON:  January 17 2020, December 29, 2019 FINDINGS: Curvilinear lucencies are identified in the posterior distal probably radius. This is suspicious for fracture. This is best seen on the lateral view. Patient status post prior fixation of the distal radius. Chronic change of the ulna styloid is noted. IMPRESSION: Curvilinear lucencies are identified in the posterior distal probably radius, suspicious for fracture. This is best seen on the lateral view. Electronically Signed   By: Abelardo Diesel M.D.   On: 05/14/2020 14:15   CT Head Wo Contrast  Result Date: 05/14/2020 CLINICAL DATA:  86 year old female with head trauma EXAM: CT HEAD WITHOUT CONTRAST TECHNIQUE: Contiguous axial images were obtained from the base of  the skull through the vertex without intravenous contrast. COMPARISON:  12/24/2009 FINDINGS: Brain: No acute intracranial hemorrhage. No midline shift or mass effect. Gray-white differentiation maintained. Mild volume loss.  Patchy hypodensity in the bilateral periventricular white matter. Unremarkable appearance of the ventricular system. Vascular: Calcifications of the intracranial vasculature. Skull: No acute fracture.  No aggressive bone lesion identified. Sinuses/Orbits: Unremarkable appearance of the orbits. Mastoid air cells clear. No middle ear effusion. No significant sinus disease. Other: None IMPRESSION: Negative for acute intracranial abnormality. Chronic microvascular ischemic disease and associated intracranial atherosclerosis. Electronically Signed   By: Corrie Mckusick D.O.   On: 05/14/2020 14:01   CT Angio Chest PE W and/or Wo Contrast  Result Date: 05/11/2020 CLINICAL DATA:  Shortness of breath EXAM: CT ANGIOGRAPHY CHEST WITH CONTRAST TECHNIQUE: Multidetector CT imaging of the chest was performed using the standard protocol during bolus administration of intravenous contrast. Multiplanar CT image reconstructions and MIPs were obtained to evaluate the vascular anatomy. CONTRAST:  89mL OMNIPAQUE IOHEXOL 350 MG/ML SOLN COMPARISON:  None. FINDINGS: Cardiovascular: Satisfactory opacification of the pulmonary arteries to the segmental level. No evidence of pulmonary embolism. Cardiomegaly. No pericardial effusion. Mediastinum/Nodes: No enlarged lymph nodes identified. Lungs/Pleura: Small right pleural effusion. No pneumothorax. Patchy ground-glass density, greatest the right lower lobe. Mild bibasilar atelectasis. Upper Abdomen: No acute abnormality Musculoskeletal: Chronic compression deformity T10 and T11. Chronic left humeral deformity. Review of the MIP images confirms the above findings. IMPRESSION: No evidence of acute pulmonary embolism. Patchy ground-glass density, greatest in the right lobe. This may reflect atelectasis, noting imaging in expiration and mucous plugging. Superimposed mild edema or atypical pneumonia not excluded. Cardiomegaly. Electronically Signed   By: Macy Mis M.D.   On: 05/11/2020 14:08    CT PELVIS WO CONTRAST  Result Date: 05/14/2020 CLINICAL DATA:  Status post fall. EXAM: CT PELVIS WITHOUT CONTRAST TECHNIQUE: Multidetector CT imaging of the pelvis was performed following the standard protocol without intravenous contrast. COMPARISON:  None. FINDINGS: Urinary Tract:  No abnormality visualized. Bowel: A left lower quadrant ostomy site is seen. There is no evidence of bowel dilatation. The appendix is not clearly identified. Noninflamed diverticula are seen throughout the visualized portion of the large bowel. Vascular/Lymphatic: No pathologically enlarged lymph nodes. There is marked severity calcification of the abdominal aorta and bilateral common iliac arteries. Reproductive: The uterus is absent. The bilateral adnexa are unremarkable. Other:  None. Musculoskeletal: Moderate to marked severity degenerative changes seen within the visualized portion of the lower lumbar spine. Mild to moderate severity degenerative changes are also seen involving both hips. No acute osseous abnormality is identified. IMPRESSION: 1. Left lower quadrant ostomy site. 2. Colonic diverticulosis. 3. Moderate to marked severity degenerative changes within the visualized portion of the lower lumbar spine. 4. Mild to moderate severity degenerative changes involving both hips. 5. Aortic atherosclerosis. Aortic Atherosclerosis (ICD10-I70.0). Electronically Signed   By: Virgina Norfolk M.D.   On: 05/14/2020 15:15   CT HIP LEFT W CONTRAST  Addendum Date: 05/19/2020   ADDENDUM REPORT: 05/19/2020 03:50 ADDENDUM: No sites of active contrast extravasation identified within the intramuscular hemorrhage. However the mixed attenuation of these collections is suggestive of mixed age blood products including acute on more subacute hemorrhage. Can reflect sequela complex high-grade tearing, though underlying musculotendinous integrity is difficult to fully assess on CT and in the setting of such extensive hemorrhages. Initial  findings and addendum were discussed via telephone on 05/19/2020 at 3:49 am to provider [facility] , who verbally acknowledged these results.  Electronically Signed   By: Lovena Le M.D.   On: 05/19/2020 03:50   Result Date: 05/19/2020 CLINICAL DATA:  Soft tissue mass, swelling EXAM: CT OF THE LOWER LEFT EXTREMITY WITH CONTRAST TECHNIQUE: Multidetector CT imaging of the lower left extremity was performed according to the standard protocol following intravenous contrast administration. COMPARISON:  Same-day radiograph, radiograph and CT pelvis 05/14/2020, CT abdomen and pelvis 01/11/2020 CONTRAST:  123mL OMNIPAQUE IOHEXOL 300 MG/ML  SOLN FINDINGS: Bones/Joint/Cartilage The osseous structures appear diffusely demineralized which may limit detection of small or nondisplaced fractures. Included portions of the bony pelvis are intact and congruent. The proximal femur is intact and normally located. Degenerative change of the left SI joint and symphysis pubis. Moderate degenerative features of the left hip with periacetabular spurring. Additional enthesopathic spurring noted upon the iliac crest, ischial tuberosities and greater trochanter of the left femur. Enthesopathic mineralization within the soft tissue seen along the anterior left hip, unchanged from prior. No evidence of acute avulsion. Benign bone island in the supra-acetabular femoral neck. Enthesopathic changes No sizable left hip effusion. Ligaments Suboptimally assessed by CT. Muscles and Tendons Multiple intramuscular hemorrhages throughout the left hip musculature. These involve the gluteus maximus, inferior gluteus medius, piriformis, proximal vastus lateralis, proximal adductor compartment and quadratus femoris musculature. The extent of this hemorrhagic change results in some increasing lateral bowing of the iliotibial band with additional overlying contusive changes. Furthermore, hemorrhage along the ileus psoas does track medially to the sacral  insertion site and there is increasing presacral fat stranding which is likely reactive. Soft tissues Lateral hip soft tissue swelling and contusive changes with the muscular hemorrhages, as above. Presacral fat stranding, increased from prior likely attributable to the piriformis muscle hemorrhage. There is a left lower quadrant colostomy noted. Hartmann's pouch in the pelvis with few noninflamed colonic diverticula. Postsurgical changes of the low anterior abdomen. Prior hysterectomy. Extensive atherosclerotic calcification. IMPRESSION: 1. Multiple large intramuscular hemorrhages throughout the left hip musculature, involving the gluteus maximus, inferior gluteus medius, inferior gluteus medius, proximal vastus lateralis, proximal adductor compartment and quadratus femoris musculature. The extent of this hemorrhagic change results in some increasing lateral bowing of the iliotibial band with additional overlying contusive changes. 2. Piriformis hemorrhage does track medially to the sacral insertion site and there is increasing presacral fat stranding which is likely reactive. 3. No acute fracture or traumatic malalignment of the visualized bony pelvis. 4. Moderate degenerative features of the left hip with periacetabular spurring. Electronically Signed: By: Lovena Le M.D. On: 05/19/2020 03:34   DG Hand Complete Left  Result Date: 05/14/2020 CLINICAL DATA:  Status post fall with left wrist pain. EXAM: LEFT HAND - COMPLETE 3+ VIEW COMPARISON:  December 29, 2019 FINDINGS: No acute fracture or dislocation is identified in the left hand. Curvilinear lucency seen the distal probably radius on the wrist series is incompletely included on the hand series. Chronic deformity of the ulna styloid is noted. IMPRESSION: No acute fracture or dislocation is identified in the left hand. Curvilinear lucency seen the distal probably radius on the wrist series is incompletely included on the hand series. Electronically Signed    By: Abelardo Diesel M.D.   On: 05/14/2020 14:17   ECHOCARDIOGRAM COMPLETE  Result Date: 05/12/2020    ECHOCARDIOGRAM REPORT   Patient Name:   JALEE SAINE Date of Exam: 05/12/2020 Medical Rec #:  [id number]       Height:       66.0 in Accession #:    [id number]  Weight:       156.9 lb Date of Birth:  July 27, 1923        BSA:          1.804 m Patient Age:    86 years        BP:           166/73 mmHg Patient Gender: F               HR:           72 bpm. Exam Location:  Inpatient Procedure: 2D Echo Indications:     Chest Pain R07.9  History:         Patient has prior history of Echocardiogram examinations, most                  recent 12/30/2019. Risk Factors:Hypertension.  Sonographer:     Mikki Santee RDCS (AE) Referring Phys:  [place] Diagnosing Phys: Dixie Dials MD IMPRESSIONS  1. Left ventricular ejection fraction, by estimation, is 55 to 60%. The left ventricle has normal function. The left ventricle has no regional wall motion abnormalities. There is mild concentric left ventricular hypertrophy. Left ventricular diastolic parameters are consistent with Grade II diastolic dysfunction (pseudonormalization).  2. Right ventricular systolic function is low normal. The right ventricular size is normal. There is mildly elevated pulmonary artery systolic pressure.  3. Left atrial size was severely dilated.  4. Right atrial size was mildly dilated.  5. The mitral valve is degenerative. Mild mitral valve regurgitation.  6. The aortic valve is tricuspid. Aortic valve regurgitation is mild.  7. Aortic dilatation noted. There is mild dilatation of the ascending aorta. There is Moderate (Grade III) atheroma plaque.  8. The inferior vena cava is dilated in size with <50% respiratory variability, suggesting right atrial pressure of 15 mmHg. FINDINGS  Left Ventricle: Left ventricular ejection fraction, by estimation, is 55 to 60%. The left ventricle has normal function. The left ventricle has no regional wall  motion abnormalities. The left ventricular internal cavity size was normal in size. There is  mild concentric left ventricular hypertrophy. Left ventricular diastolic parameters are consistent with Grade II diastolic dysfunction (pseudonormalization). Right Ventricle: Prominent moderator band. The right ventricular size is normal. No increase in right ventricular wall thickness. Right ventricular systolic function is low normal. There is mildly elevated pulmonary artery systolic pressure. The tricuspid regurgitant velocity is 2.66 m/s, and with an assumed right atrial pressure of 8 mmHg, the estimated right ventricular systolic pressure is 62.6 mmHg. Left Atrium: Left atrial size was severely dilated. Right Atrium: Right atrial size was mildly dilated. Pericardium: There is no evidence of pericardial effusion. Mitral Valve: The mitral valve is degenerative in appearance. There is mild calcification of the mitral valve leaflet(s). Moderate mitral annular calcification. Mild mitral valve regurgitation. MV peak gradient, 9.4 mmHg. The mean mitral valve gradient is 4.0 mmHg. Tricuspid Valve: The tricuspid valve is normal in structure. Tricuspid valve regurgitation is mild. Aortic Valve: The aortic valve is tricuspid. . There is mild thickening and mild calcification of the aortic valve. Aortic valve regurgitation is mild. Aortic regurgitation PHT measures 433 msec. Mild to moderate aortic valve annular calcification. There  is mild thickening of the aortic valve. There is mild calcification of the aortic valve. Pulmonic Valve: The pulmonic valve was normal in structure. Pulmonic valve regurgitation is mild. Aorta: Aortic dilatation noted. There is mild dilatation of the ascending aorta. There is moderate (Grade III) atheroma plaque. Venous: The  inferior vena cava is dilated in size with less than 50% respiratory variability, suggesting right atrial pressure of 15 mmHg. IAS/Shunts: The interatrial septum was not  assessed.  LEFT VENTRICLE PLAX 2D LVIDd:         4.10 cm  Diastology LVIDs:         3.20 cm  LV e' lateral:   4.47 cm/s LV PW:         1.30 cm  LV E/e' lateral: 28.4 LV IVS:        1.30 cm  LV e' medial:    5.87 cm/s LVOT diam:     2.30 cm  LV E/e' medial:  21.6 LV SV:         97 LV SV Index:   54 LVOT Area:     4.15 cm  RIGHT VENTRICLE RV S prime:     11.60 cm/s TAPSE (M-mode): 1.7 cm LEFT ATRIUM              Index       RIGHT ATRIUM           Index LA diam:        3.70 cm  2.05 cm/m  RA Area:     17.40 cm LA Vol (A2C):   102.0 ml 56.55 ml/m RA Volume:   40.00 ml  22.18 ml/m LA Vol (A4C):   82.3 ml  45.63 ml/m LA Biplane Vol: 91.8 ml  50.89 ml/m  AORTIC VALVE LVOT Vmax:   100.00 cm/s LVOT Vmean:  73.800 cm/s LVOT VTI:    0.233 m AI PHT:      433 msec  AORTA Ao Root diam: 3.30 cm MITRAL VALVE                TRICUSPID VALVE MV Area (PHT): 2.76 cm     TR Peak grad:   28.3 mmHg MV Peak grad:  9.4 mmHg     TR Vmax:        266.00 cm/s MV Mean grad:  4.0 mmHg MV Vmax:       1.53 m/s     SHUNTS MV Vmean:      88.7 cm/s    Systemic VTI:  0.23 m MV Decel Time: 275 msec     Systemic Diam: 2.30 cm MV E velocity: 127.00 cm/s MV A velocity: 78.20 cm/s MV E/A ratio:  1.62 Dixie Dials MD Electronically signed by Dixie Dials MD Signature Date/Time: 05/12/2020/10:11:08 AM    Final    DG Hip Unilat W or Wo Pelvis 2-3 Views Left  Result Date: 05/19/2020 CLINICAL DATA:  Fall 05/14/2020, lateral hip pain radiating down leg, bruising EXAM: DG HIP (WITH OR WITHOUT PELVIS) 2-3V LEFT COMPARISON:  CT and radiograph 05/14/2020 FINDINGS: Bones of the pelvis remain intact and congruent. Proximal femora are normally located. Vance degenerative changes throughout the lower lumbar spine, SI joints, hips and pelvis. Enthesopathic changes noted throughout the pelvis as well. There is an enlarging lateral left hip hematoma with extensive soft tissue swelling laterally likely distending the iliotibial band. IMPRESSION: Enlarging lateral  left hip hematoma with extensive soft tissue swelling laterally likely distending the iliotibial band. No acute osseous abnormality. These results were called by telephone at the time of interpretation on 05/19/2020 at 1:39 am to provider [facility] , who verbally acknowledged these results. Electronically Signed   By: Lovena Le M.D.   On: 05/19/2020 01:39   DG Hip Unilat W or Wo Pelvis 2-3 Views Left  Result Date: 05/14/2020 CLINICAL DATA:  Status post fall EXAM: DG HIP (WITH OR WITHOUT PELVIS) 2-3V LEFT COMPARISON:  None. FINDINGS: No acute fracture or dislocation is identified in the left hip. Degenerative joint changes of bilateral hips and lumbar spine are noted. IMPRESSION: No acute fracture or dislocation. Electronically Signed   By: Abelardo Diesel M.D.   On: 05/14/2020 14:13   DG FEMUR MIN 2 VIEWS LEFT  Result Date: 05/19/2020 CLINICAL DATA:  Fall with hip pain EXAM: LEFT FEMUR 2 VIEWS COMPARISON:  None. FINDINGS: There is no evidence of fracture or other focal bone lesions. Soft tissues are unremarkable. IMPRESSION: Negative. Electronically Signed   By: Ulyses Jarred M.D.   On: 05/19/2020 01:40    Time coordinating discharge: Over 30 minutes  SIGNED:   Guilford Shi, MD  [facility] 05/24/2020, 9:31 AM

## 2020-05-24 NOTE — Progress Notes (Signed)
Went over discharge instructions w/ pt and her son Konrad Dolores.

## 2020-05-25 ENCOUNTER — Inpatient Hospital Stay: Payer: Medicare PPO | Admitting: Family Medicine

## 2020-05-25 DIAGNOSIS — M353 Polymyalgia rheumatica: Secondary | ICD-10-CM | POA: Diagnosis not present

## 2020-05-25 DIAGNOSIS — I951 Orthostatic hypotension: Secondary | ICD-10-CM | POA: Diagnosis not present

## 2020-05-25 DIAGNOSIS — R338 Other retention of urine: Secondary | ICD-10-CM | POA: Diagnosis not present

## 2020-05-25 DIAGNOSIS — I10 Essential (primary) hypertension: Secondary | ICD-10-CM | POA: Diagnosis not present

## 2020-05-25 DIAGNOSIS — R262 Difficulty in walking, not elsewhere classified: Secondary | ICD-10-CM | POA: Diagnosis not present

## 2020-05-25 DIAGNOSIS — D6489 Other specified anemias: Secondary | ICD-10-CM | POA: Diagnosis not present

## 2020-05-25 DIAGNOSIS — D473 Essential (hemorrhagic) thrombocythemia: Secondary | ICD-10-CM | POA: Diagnosis not present

## 2020-05-25 DIAGNOSIS — G6289 Other specified polyneuropathies: Secondary | ICD-10-CM | POA: Diagnosis not present

## 2020-05-25 DIAGNOSIS — I480 Paroxysmal atrial fibrillation: Secondary | ICD-10-CM | POA: Diagnosis not present

## 2020-05-28 ENCOUNTER — Other Ambulatory Visit: Payer: Self-pay | Admitting: Family Medicine

## 2020-05-29 DIAGNOSIS — I951 Orthostatic hypotension: Secondary | ICD-10-CM | POA: Diagnosis not present

## 2020-05-29 DIAGNOSIS — S7002XA Contusion of left hip, initial encounter: Secondary | ICD-10-CM | POA: Diagnosis not present

## 2020-05-29 DIAGNOSIS — R338 Other retention of urine: Secondary | ICD-10-CM | POA: Diagnosis not present

## 2020-05-29 DIAGNOSIS — I480 Paroxysmal atrial fibrillation: Secondary | ICD-10-CM | POA: Diagnosis not present

## 2020-05-29 DIAGNOSIS — R262 Difficulty in walking, not elsewhere classified: Secondary | ICD-10-CM | POA: Diagnosis not present

## 2020-05-29 DIAGNOSIS — Z86718 Personal history of other venous thrombosis and embolism: Secondary | ICD-10-CM | POA: Diagnosis not present

## 2020-05-29 DIAGNOSIS — D473 Essential (hemorrhagic) thrombocythemia: Secondary | ICD-10-CM | POA: Diagnosis not present

## 2020-05-29 DIAGNOSIS — D72829 Elevated white blood cell count, unspecified: Secondary | ICD-10-CM | POA: Diagnosis not present

## 2020-05-29 DIAGNOSIS — D6489 Other specified anemias: Secondary | ICD-10-CM | POA: Diagnosis not present

## 2020-06-01 DIAGNOSIS — K5901 Slow transit constipation: Secondary | ICD-10-CM | POA: Diagnosis not present

## 2020-06-01 DIAGNOSIS — R338 Other retention of urine: Secondary | ICD-10-CM | POA: Diagnosis not present

## 2020-06-01 DIAGNOSIS — S7002XD Contusion of left hip, subsequent encounter: Secondary | ICD-10-CM | POA: Diagnosis not present

## 2020-06-01 DIAGNOSIS — K9400 Colostomy complication, unspecified: Secondary | ICD-10-CM | POA: Diagnosis not present

## 2020-06-06 DIAGNOSIS — Z9181 History of falling: Secondary | ICD-10-CM | POA: Diagnosis not present

## 2020-06-06 DIAGNOSIS — M6281 Muscle weakness (generalized): Secondary | ICD-10-CM | POA: Diagnosis not present

## 2020-06-06 DIAGNOSIS — R2689 Other abnormalities of gait and mobility: Secondary | ICD-10-CM | POA: Diagnosis not present

## 2020-06-06 DIAGNOSIS — M6259 Muscle wasting and atrophy, not elsewhere classified, multiple sites: Secondary | ICD-10-CM | POA: Diagnosis not present

## 2020-06-06 DIAGNOSIS — R2681 Unsteadiness on feet: Secondary | ICD-10-CM | POA: Diagnosis not present

## 2020-06-06 DIAGNOSIS — R262 Difficulty in walking, not elsewhere classified: Secondary | ICD-10-CM | POA: Diagnosis not present

## 2020-06-06 DIAGNOSIS — S7010XA Contusion of unspecified thigh, initial encounter: Secondary | ICD-10-CM | POA: Diagnosis not present

## 2020-06-07 DIAGNOSIS — R2689 Other abnormalities of gait and mobility: Secondary | ICD-10-CM | POA: Diagnosis not present

## 2020-06-07 DIAGNOSIS — M6259 Muscle wasting and atrophy, not elsewhere classified, multiple sites: Secondary | ICD-10-CM | POA: Diagnosis not present

## 2020-06-07 DIAGNOSIS — S7010XA Contusion of unspecified thigh, initial encounter: Secondary | ICD-10-CM | POA: Diagnosis not present

## 2020-06-07 DIAGNOSIS — Z9181 History of falling: Secondary | ICD-10-CM | POA: Diagnosis not present

## 2020-06-07 DIAGNOSIS — M6281 Muscle weakness (generalized): Secondary | ICD-10-CM | POA: Diagnosis not present

## 2020-06-07 DIAGNOSIS — R2681 Unsteadiness on feet: Secondary | ICD-10-CM | POA: Diagnosis not present

## 2020-06-07 DIAGNOSIS — R262 Difficulty in walking, not elsewhere classified: Secondary | ICD-10-CM | POA: Diagnosis not present

## 2020-06-08 DIAGNOSIS — Z9181 History of falling: Secondary | ICD-10-CM | POA: Diagnosis not present

## 2020-06-08 DIAGNOSIS — K5901 Slow transit constipation: Secondary | ICD-10-CM | POA: Diagnosis not present

## 2020-06-08 DIAGNOSIS — N390 Urinary tract infection, site not specified: Secondary | ICD-10-CM | POA: Diagnosis not present

## 2020-06-08 DIAGNOSIS — R2681 Unsteadiness on feet: Secondary | ICD-10-CM | POA: Diagnosis not present

## 2020-06-08 DIAGNOSIS — D72829 Elevated white blood cell count, unspecified: Secondary | ICD-10-CM | POA: Diagnosis not present

## 2020-06-08 DIAGNOSIS — M6281 Muscle weakness (generalized): Secondary | ICD-10-CM | POA: Diagnosis not present

## 2020-06-08 DIAGNOSIS — R2689 Other abnormalities of gait and mobility: Secondary | ICD-10-CM | POA: Diagnosis not present

## 2020-06-08 DIAGNOSIS — R339 Retention of urine, unspecified: Secondary | ICD-10-CM | POA: Diagnosis not present

## 2020-06-08 DIAGNOSIS — S7010XA Contusion of unspecified thigh, initial encounter: Secondary | ICD-10-CM | POA: Diagnosis not present

## 2020-06-08 DIAGNOSIS — R262 Difficulty in walking, not elsewhere classified: Secondary | ICD-10-CM | POA: Diagnosis not present

## 2020-06-08 DIAGNOSIS — S7002XA Contusion of left hip, initial encounter: Secondary | ICD-10-CM | POA: Diagnosis not present

## 2020-06-08 DIAGNOSIS — R350 Frequency of micturition: Secondary | ICD-10-CM | POA: Diagnosis not present

## 2020-06-08 DIAGNOSIS — Z79899 Other long term (current) drug therapy: Secondary | ICD-10-CM | POA: Diagnosis not present

## 2020-06-08 DIAGNOSIS — R338 Other retention of urine: Secondary | ICD-10-CM | POA: Diagnosis not present

## 2020-06-08 DIAGNOSIS — M6259 Muscle wasting and atrophy, not elsewhere classified, multiple sites: Secondary | ICD-10-CM | POA: Diagnosis not present

## 2020-06-09 DIAGNOSIS — Z9181 History of falling: Secondary | ICD-10-CM | POA: Diagnosis not present

## 2020-06-09 DIAGNOSIS — R2689 Other abnormalities of gait and mobility: Secondary | ICD-10-CM | POA: Diagnosis not present

## 2020-06-09 DIAGNOSIS — M6281 Muscle weakness (generalized): Secondary | ICD-10-CM | POA: Diagnosis not present

## 2020-06-09 DIAGNOSIS — S7010XA Contusion of unspecified thigh, initial encounter: Secondary | ICD-10-CM | POA: Diagnosis not present

## 2020-06-09 DIAGNOSIS — M6259 Muscle wasting and atrophy, not elsewhere classified, multiple sites: Secondary | ICD-10-CM | POA: Diagnosis not present

## 2020-06-09 DIAGNOSIS — R2681 Unsteadiness on feet: Secondary | ICD-10-CM | POA: Diagnosis not present

## 2020-06-09 DIAGNOSIS — R262 Difficulty in walking, not elsewhere classified: Secondary | ICD-10-CM | POA: Diagnosis not present

## 2020-06-10 DIAGNOSIS — Z9181 History of falling: Secondary | ICD-10-CM | POA: Diagnosis not present

## 2020-06-10 DIAGNOSIS — S7010XA Contusion of unspecified thigh, initial encounter: Secondary | ICD-10-CM | POA: Diagnosis not present

## 2020-06-10 DIAGNOSIS — R262 Difficulty in walking, not elsewhere classified: Secondary | ICD-10-CM | POA: Diagnosis not present

## 2020-06-10 DIAGNOSIS — R2689 Other abnormalities of gait and mobility: Secondary | ICD-10-CM | POA: Diagnosis not present

## 2020-06-10 DIAGNOSIS — M6281 Muscle weakness (generalized): Secondary | ICD-10-CM | POA: Diagnosis not present

## 2020-06-10 DIAGNOSIS — R2681 Unsteadiness on feet: Secondary | ICD-10-CM | POA: Diagnosis not present

## 2020-06-10 DIAGNOSIS — M6259 Muscle wasting and atrophy, not elsewhere classified, multiple sites: Secondary | ICD-10-CM | POA: Diagnosis not present

## 2020-06-11 DIAGNOSIS — S7010XA Contusion of unspecified thigh, initial encounter: Secondary | ICD-10-CM | POA: Diagnosis not present

## 2020-06-11 DIAGNOSIS — M6281 Muscle weakness (generalized): Secondary | ICD-10-CM | POA: Diagnosis not present

## 2020-06-11 DIAGNOSIS — R2689 Other abnormalities of gait and mobility: Secondary | ICD-10-CM | POA: Diagnosis not present

## 2020-06-11 DIAGNOSIS — M6259 Muscle wasting and atrophy, not elsewhere classified, multiple sites: Secondary | ICD-10-CM | POA: Diagnosis not present

## 2020-06-11 DIAGNOSIS — R262 Difficulty in walking, not elsewhere classified: Secondary | ICD-10-CM | POA: Diagnosis not present

## 2020-06-11 DIAGNOSIS — Z9181 History of falling: Secondary | ICD-10-CM | POA: Diagnosis not present

## 2020-06-11 DIAGNOSIS — R2681 Unsteadiness on feet: Secondary | ICD-10-CM | POA: Diagnosis not present

## 2020-06-12 DIAGNOSIS — N390 Urinary tract infection, site not specified: Secondary | ICD-10-CM | POA: Diagnosis not present

## 2020-06-12 DIAGNOSIS — M6259 Muscle wasting and atrophy, not elsewhere classified, multiple sites: Secondary | ICD-10-CM | POA: Diagnosis not present

## 2020-06-12 DIAGNOSIS — R2689 Other abnormalities of gait and mobility: Secondary | ICD-10-CM | POA: Diagnosis not present

## 2020-06-12 DIAGNOSIS — Z9181 History of falling: Secondary | ICD-10-CM | POA: Diagnosis not present

## 2020-06-12 DIAGNOSIS — R262 Difficulty in walking, not elsewhere classified: Secondary | ICD-10-CM | POA: Diagnosis not present

## 2020-06-12 DIAGNOSIS — I480 Paroxysmal atrial fibrillation: Secondary | ICD-10-CM | POA: Diagnosis not present

## 2020-06-12 DIAGNOSIS — S7010XA Contusion of unspecified thigh, initial encounter: Secondary | ICD-10-CM | POA: Diagnosis not present

## 2020-06-12 DIAGNOSIS — M6281 Muscle weakness (generalized): Secondary | ICD-10-CM | POA: Diagnosis not present

## 2020-06-12 DIAGNOSIS — R2681 Unsteadiness on feet: Secondary | ICD-10-CM | POA: Diagnosis not present

## 2020-06-13 DIAGNOSIS — M6259 Muscle wasting and atrophy, not elsewhere classified, multiple sites: Secondary | ICD-10-CM | POA: Diagnosis not present

## 2020-06-13 DIAGNOSIS — R262 Difficulty in walking, not elsewhere classified: Secondary | ICD-10-CM | POA: Diagnosis not present

## 2020-06-13 DIAGNOSIS — Z86718 Personal history of other venous thrombosis and embolism: Secondary | ICD-10-CM | POA: Diagnosis not present

## 2020-06-13 DIAGNOSIS — S7010XA Contusion of unspecified thigh, initial encounter: Secondary | ICD-10-CM | POA: Diagnosis not present

## 2020-06-13 DIAGNOSIS — R2681 Unsteadiness on feet: Secondary | ICD-10-CM | POA: Diagnosis not present

## 2020-06-13 DIAGNOSIS — D6489 Other specified anemias: Secondary | ICD-10-CM | POA: Diagnosis not present

## 2020-06-13 DIAGNOSIS — I951 Orthostatic hypotension: Secondary | ICD-10-CM | POA: Diagnosis not present

## 2020-06-13 DIAGNOSIS — Z433 Encounter for attention to colostomy: Secondary | ICD-10-CM | POA: Diagnosis not present

## 2020-06-13 DIAGNOSIS — N390 Urinary tract infection, site not specified: Secondary | ICD-10-CM | POA: Diagnosis not present

## 2020-06-13 DIAGNOSIS — R2689 Other abnormalities of gait and mobility: Secondary | ICD-10-CM | POA: Diagnosis not present

## 2020-06-13 DIAGNOSIS — M353 Polymyalgia rheumatica: Secondary | ICD-10-CM | POA: Diagnosis not present

## 2020-06-13 DIAGNOSIS — I480 Paroxysmal atrial fibrillation: Secondary | ICD-10-CM | POA: Diagnosis not present

## 2020-06-13 DIAGNOSIS — Z9181 History of falling: Secondary | ICD-10-CM | POA: Diagnosis not present

## 2020-06-13 DIAGNOSIS — I10 Essential (primary) hypertension: Secondary | ICD-10-CM | POA: Diagnosis not present

## 2020-06-13 DIAGNOSIS — M6281 Muscle weakness (generalized): Secondary | ICD-10-CM | POA: Diagnosis not present

## 2020-06-20 ENCOUNTER — Encounter: Payer: Self-pay | Admitting: Internal Medicine

## 2020-06-20 ENCOUNTER — Other Ambulatory Visit: Payer: Self-pay

## 2020-06-20 ENCOUNTER — Ambulatory Visit: Payer: Medicare PPO | Admitting: Internal Medicine

## 2020-06-20 VITALS — BP 144/76 | HR 65 | Ht 66.0 in | Wt 155.0 lb

## 2020-06-20 DIAGNOSIS — I10 Essential (primary) hypertension: Secondary | ICD-10-CM | POA: Diagnosis not present

## 2020-06-20 DIAGNOSIS — S5292XA Unspecified fracture of left forearm, initial encounter for closed fracture: Secondary | ICD-10-CM | POA: Diagnosis not present

## 2020-06-20 DIAGNOSIS — I4819 Other persistent atrial fibrillation: Secondary | ICD-10-CM

## 2020-06-20 DIAGNOSIS — M8580 Other specified disorders of bone density and structure, unspecified site: Secondary | ICD-10-CM | POA: Diagnosis not present

## 2020-06-20 DIAGNOSIS — Z79899 Other long term (current) drug therapy: Secondary | ICD-10-CM

## 2020-06-20 DIAGNOSIS — M1990 Unspecified osteoarthritis, unspecified site: Secondary | ICD-10-CM | POA: Diagnosis not present

## 2020-06-20 DIAGNOSIS — R5381 Other malaise: Secondary | ICD-10-CM | POA: Diagnosis not present

## 2020-06-20 MED ORDER — APIXABAN 5 MG PO TABS
5.0000 mg | ORAL_TABLET | Freq: Two times a day (BID) | ORAL | 3 refills | Status: DC
Start: 2020-06-20 — End: 2021-09-05

## 2020-06-20 NOTE — Progress Notes (Signed)
Patient Care Team: [name], MD as PCP - General (Family Medicine) Susa Day, MD as Consulting Physician (Orthopedic Surgery) Druscilla Brownie, MD as Consulting Physician (Dermatology) Greer Pickerel, MD as Consulting Physician (General Surgery) Dixie Dials, MD as Consulting Physician (Cardiology) Iran Planas, MD as Consulting Physician (Orthopedic Surgery)   HPI  [name] is a 86 y.o. female Seen in follow-up for atrial fibrillation for which she had been previously on amiodarone without benefit.  History of provoked DVT.  Elected to undertake cardioversion to see if we could make an determination as to whether sinus was better.  6/21. Post cardioversion course was complicated by pneumonia. Following discharge from pneumonia she fell. Recently hospitalized 7/21 with a fall and intramuscular hematoma.  Eliquis held  No interval bleeding   No interval awareness of afib  Overall feels stronger then prior to cardioversion   Records and Results Reviewed   Past Medical History:  Diagnosis Date  . DVT (deep venous thrombosis) ([place]) 09/2015   RLE  . GERD (gastroesophageal reflux disease)   . Hypertension   . Hypothyroidism   . Melanoma ([place]) 06/16/2017   Facial melanoma, removed by Dr. Harvel Quale  . Peripheral vascular disease ([place])    peripheral neuropathy    Past Surgical History:  Procedure Laterality Date  . ABDOMINAL HYSTERECTOMY    . APPENDECTOMY    . BACK SURGERY    . BREAST SURGERY     left breast biopsy  . CARDIOVERSION N/A 05/09/2020   Procedure: CARDIOVERSION;  Surgeon: Skeet Latch, MD;  Location: [facility];  Service: Cardiovascular;  Laterality: N/A;  . COLOSTOMY N/A 01/06/2020   Procedure: Colostomy;  Surgeon: Greer Pickerel, MD;  Location: [facility];  Service: General;  Laterality: N/A;  . EYE SURGERY     cataract  . HARDWARE REMOVAL Left 01/01/2020   Procedure: HARDWARE REMOVAL;  Surgeon: Iran Planas, MD;  Location: [facility];   Service: Orthopedics;  Laterality: Left;  . LAPAROTOMY N/A 01/06/2020   Procedure: Exploratory Laparotomy, Open Sigmoid colectomy;  Surgeon: Greer Pickerel, MD;  Location: [facility];  Service: General;  Laterality: N/A;  . LIPOMA EXCISION Left 04/25/2015   Procedure: EXCISION OF LIPOMA LEFT ARM;  Surgeon: Donnie Mesa, MD;  Location: [facility];  Service: General;  Laterality: Left;  . LUMBAR LAMINECTOMY/DECOMPRESSION MICRODISCECTOMY  11/20/2011   Procedure: LUMBAR LAMINECTOMY/DECOMPRESSION MICRODISCECTOMY;  Surgeon: Jeneen Rinks P [place];  Location: [facility];  Service: Orthopedics;  Laterality: N/A;  Decompressive Laminectomy L2 to the Sacrum (X-Ray)  . OPEN REDUCTION INTERNAL FIXATION (ORIF) DISTAL RADIAL FRACTURE Left 01/01/2020   Procedure: OPEN REDUCTION INTERNAL FIXATION (ORIF) DISTAL RADIAL FRACTURE;  Surgeon: Iran Planas, MD;  Location: [facility];  Service: Orthopedics;  Laterality: Left;  . OTHER SURGICAL HISTORY     left wrist surgery - has plate in left wrist   . OTHER SURGICAL HISTORY     right knee surgery due to torn cartilage   . TONSILLECTOMY    . WRIST FRACTURE SURGERY Left     Current Meds  Medication Sig  . acetaminophen (TYLENOL) 325 MG tablet Take 1-2 tablets (325-650 mg total) by mouth every 4 (four) hours as needed for mild pain.  . cyclobenzaprine (FLEXERIL) 5 MG tablet Take 1-2 tablets (5-10 mg total) by mouth 3 (three) times daily as needed for muscle spasms.  Marland Kitchen diltiazem (DILACOR XR) 120 MG 24 hr capsule Take 1 capsule (120 mg total) by mouth daily.  Marland Kitchen docusate sodium (COLACE)  100 MG capsule Take 1 capsule (100 mg total) by mouth 2 (two) times daily.  Marland Kitchen gabapentin (NEURONTIN) 300 MG capsule Take 300 mg by mouth at bedtime.  Marland Kitchen levothyroxine (SYNTHROID) 50 MCG tablet TAKE 1 TABLET BY MOUTH EVERY DAY (Patient taking differently: Take 50 mcg by mouth daily before breakfast. )  . Multiple Vitamins-Minerals (PRESERVISION AREDS 2) CAPS Take 1 capsule by mouth daily with  lunch.   . nadolol (CORGARD) 80 MG tablet Take 1 tablet (80 mg total) by mouth daily.  . ondansetron (ZOFRAN) 4 MG tablet Take 1 tablet (4 mg total) by mouth every 8 (eight) hours as needed for nausea or vomiting.  . pantoprazole (PROTONIX) 40 MG tablet Take 1 tablet (40 mg total) by mouth daily.  . predniSONE (DELTASONE) 1 MG tablet Take 4 mg by mouth daily. Take with the prednisone 5mg  for a total of 9mg   . predniSONE (DELTASONE) 5 MG tablet Take 5 mg by mouth daily with lunch. Take with prednisone 1 mg x 4 for a total of 9mg   . senna-docusate (SENOKOT-S) 8.6-50 MG tablet Take 1 tablet by mouth at bedtime.    Allergies  Allergen Reactions  . Codeine Other (See Comments)    Nightmares, imagining things  . Keflex [Cephalexin] Nausea Only    Pt ended up in ER w/ CP      Review of Systems negative except from HPI and PMH  Physical Exam BP (!) 144/76   Pulse 65   Ht 5\' 6"  (1.676 m)   Wt 155 lb (70.3 kg)   SpO2 98%   BMI 25.02 kg/m  Well developed and well nourished in no acute distress HENT normal E scleral and icterus clear Neck Supple JVP flat; carotids brisk and full Clear to ausculation  Regular rate and rhythm, no murmurs gallops or rub Soft with active bowel sounds No clubbing cyanosis  Edema Alert and oriented, grossly normal motor and sensory function Skin Warm and Dry  ECG sinus at 65 Intervals 20/09/41    Assessment and  Plan  Atrial fibrillation persistent  Hypertension  HFpEF  Anemia  Euvolemic continue current meds  BP reasonably controlled  Holding sinus rhythm following cardioversion. Somewhat surprised. Indeed very surprised. The patient and her daughter are both quite impressed at how well she is recovered from the untoward events following her cardioversion. At this juncture we would presume that sinus is better than A. fib and that if she recurs, would undertake repeat cardioversion if she is amenable; antiarrhythmic drug therapy would be  informed by the frequency of the recurring events  Will check Hgb and resume Apixoban    Followup 6 mo   Current medicines are reviewed at length with the patient today .  The patient does not  have concerns regarding medicines.

## 2020-06-20 NOTE — Patient Instructions (Addendum)
Medication Instructions:  Your physician has recommended you make the following change in your medication:   **  Resume Eliquis 5mg  - 1 tablet by mouth twice daily   Lab Work: CBC in 2 weeks - Pt will have PCP complete lab work  If you have labs (blood work) drawn today and your tests are completely normal, you will receive your results only by: Marland Kitchen MyChart Message (if you have MyChart) OR . A paper copy in the mail If you have any lab test that is abnormal or we need to change your treatment, we will call you to review the results.   Testing/Procedures: None ordered.    Follow-Up: At [facility], you and your health needs are our priority.  As part of our continuing mission to provide you with exceptional heart care, we have created designated Provider Care Teams.  These Care Teams include your primary Cardiologist (physician) and Advanced Practice Providers (APPs -  Physician Assistants and Nurse Practitioners) who all work together to provide you with the care you need, when you need it.  We recommend signing up for the patient portal called "MyChart".  Sign up information is provided on this After Visit Summary.  MyChart is used to connect with patients for Virtual Visits (Telemedicine).  Patients are able to view lab/test results, encounter notes, upcoming appointments, etc.  Non-urgent messages can be sent to your provider as well.   To learn more about what you can do with MyChart, go to [email or web address].    Your next appointment:   4 month(s)  The format for your next appointment:   In Person  Provider:   Virl Axe, MD

## 2020-06-25 DIAGNOSIS — I739 Peripheral vascular disease, unspecified: Secondary | ICD-10-CM | POA: Diagnosis not present

## 2020-06-25 DIAGNOSIS — D631 Anemia in chronic kidney disease: Secondary | ICD-10-CM | POA: Diagnosis not present

## 2020-06-25 DIAGNOSIS — M353 Polymyalgia rheumatica: Secondary | ICD-10-CM | POA: Diagnosis not present

## 2020-06-25 DIAGNOSIS — I1310 Hypertensive heart and chronic kidney disease without heart failure, with stage 1 through stage 4 chronic kidney disease, or unspecified chronic kidney disease: Secondary | ICD-10-CM | POA: Diagnosis not present

## 2020-06-25 DIAGNOSIS — S7002XD Contusion of left hip, subsequent encounter: Secondary | ICD-10-CM | POA: Diagnosis not present

## 2020-06-25 DIAGNOSIS — R339 Retention of urine, unspecified: Secondary | ICD-10-CM | POA: Diagnosis not present

## 2020-06-25 DIAGNOSIS — K5901 Slow transit constipation: Secondary | ICD-10-CM | POA: Diagnosis not present

## 2020-06-25 DIAGNOSIS — N1831 Chronic kidney disease, stage 3a: Secondary | ICD-10-CM | POA: Diagnosis not present

## 2020-06-25 DIAGNOSIS — I480 Paroxysmal atrial fibrillation: Secondary | ICD-10-CM | POA: Diagnosis not present

## 2020-06-26 ENCOUNTER — Ambulatory Visit: Payer: Medicare PPO | Admitting: Family Medicine

## 2020-06-26 ENCOUNTER — Telehealth: Payer: Self-pay | Admitting: Family Medicine

## 2020-06-26 ENCOUNTER — Other Ambulatory Visit: Payer: Self-pay

## 2020-06-26 ENCOUNTER — Encounter: Payer: Self-pay | Admitting: Family Medicine

## 2020-06-26 VITALS — BP 134/86 | HR 56 | Temp 97.7°F | Resp 16 | Ht 66.0 in | Wt 155.5 lb

## 2020-06-26 DIAGNOSIS — Z7952 Long term (current) use of systemic steroids: Secondary | ICD-10-CM | POA: Diagnosis not present

## 2020-06-26 DIAGNOSIS — I4819 Other persistent atrial fibrillation: Secondary | ICD-10-CM | POA: Diagnosis not present

## 2020-06-26 DIAGNOSIS — D62 Acute posthemorrhagic anemia: Secondary | ICD-10-CM | POA: Diagnosis not present

## 2020-06-26 DIAGNOSIS — S7002XD Contusion of left hip, subsequent encounter: Secondary | ICD-10-CM | POA: Diagnosis not present

## 2020-06-26 LAB — CBC WITH DIFFERENTIAL/PLATELET
Basophils Absolute: 0 10*3/uL (ref 0.0–0.1)
Basophils Relative: 0.3 % (ref 0.0–3.0)
Eosinophils Absolute: 0.2 10*3/uL (ref 0.0–0.7)
Eosinophils Relative: 1.9 % (ref 0.0–5.0)
HCT: 34.6 % — ABNORMAL LOW (ref 36.0–46.0)
Hemoglobin: 11.2 g/dL — ABNORMAL LOW (ref 12.0–15.0)
Lymphocytes Relative: 21.8 % (ref 12.0–46.0)
Lymphs Abs: 2.5 10*3/uL (ref 0.7–4.0)
MCHC: 32.4 g/dL (ref 30.0–36.0)
MCV: 93.1 fl (ref 78.0–100.0)
Monocytes Absolute: 1.2 10*3/uL — ABNORMAL HIGH (ref 0.1–1.0)
Monocytes Relative: 10.4 % (ref 3.0–12.0)
Neutro Abs: 7.4 10*3/uL (ref 1.4–7.7)
Neutrophils Relative %: 65.6 % (ref 43.0–77.0)
Platelets: 306 10*3/uL (ref 150.0–400.0)
RBC: 3.72 Mil/uL — ABNORMAL LOW (ref 3.87–5.11)
RDW: 17.2 % — ABNORMAL HIGH (ref 11.5–15.5)
WBC: 11.3 10*3/uL — ABNORMAL HIGH (ref 4.0–10.5)

## 2020-06-26 LAB — BASIC METABOLIC PANEL
BUN: 18 mg/dL (ref 6–23)
CO2: 28 mEq/L (ref 19–32)
Calcium: 9.2 mg/dL (ref 8.4–10.5)
Chloride: 106 mEq/L (ref 96–112)
Creatinine, Ser: 0.85 mg/dL (ref 0.40–1.20)
GFR: 62.11 mL/min (ref >60.00)
Glucose, Bld: 86 mg/dL (ref 70–99)
Potassium: 3.7 mEq/L (ref 3.5–5.1)
Sodium: 143 mEq/L (ref 135–145)

## 2020-06-26 NOTE — Telephone Encounter (Signed)
Ok for verbal orders ?

## 2020-06-26 NOTE — Telephone Encounter (Signed)
[place] for verbal orders?

## 2020-06-26 NOTE — Patient Instructions (Addendum)
Schedule your complete physical in 3 months We'll notify you of your lab results and make any changes if needed Continue the Eliquis as directed by Dr Caryl Comes but let one of [place] know if there is bleeding or excessive bruising Call with any questions or concerns I am SO sorry for your loss! Stay Safe!  Stay Healthy!

## 2020-06-26 NOTE — Telephone Encounter (Signed)
Verdis Frederickson called in asking or verbal order to see Mrs. Sadek 2X a wk for 3wks and 1X a wk for 5wks for PT.  Ok to [facility] if no answer phone # [phone]

## 2020-06-26 NOTE — Telephone Encounter (Signed)
Verbal ok given.

## 2020-06-26 NOTE — Progress Notes (Signed)
   Subjective:    Patient ID: [name], female    DOB: Nov 14, 1922, 86 y.o.   MRN: [id number]  HPI L hip hematoma- pt reports 'that was the worst pain I've ever had'.  No longer bothering her.  'my hip is fine'.  Bruising has resolved.  Restarts PT this week.  Anemia- Hgb at hospital d/c was 8.4.  Pt was not able to tolerate iron due to constipation and colostomy.    Afib- saw Dr Caryl Comes last week and Eliquis was restarted.  No abnormal bleeding or bruising at this time.    Chronic steroids- dose has been reduced to 9mg  daily.  Has appt w/ Rheumatology pending.  Debility- pt is having help 9am-3pm.  Is now spending the night alone.  Also has weekends 'to myself'.  Pt reports feeling safe at home.   Review of Systems For ROS see HPI   This visit occurred during the SARS-CoV-2 public health emergency.  Safety protocols were in place, including screening questions prior to the visit, additional usage of staff PPE, and extensive cleaning of exam room while observing appropriate contact time as indicated for disinfecting solutions.       Objective:   Physical Exam Vitals reviewed.  Constitutional:      General: She is not in acute distress.    Appearance: Normal appearance. She is well-developed. She is not ill-appearing.  HENT:     Head: Normocephalic and atraumatic.  Eyes:     Conjunctiva/sclera: Conjunctivae normal.     Pupils: Pupils are equal, round, and reactive to light.  Neck:     Thyroid: No thyromegaly.  Cardiovascular:     Rate and Rhythm: Normal rate and regular rhythm.     Heart sounds: Normal heart sounds. No murmur heard.   Pulmonary:     Effort: Pulmonary effort is normal. No respiratory distress.     Breath sounds: Normal breath sounds.  Abdominal:     General: There is no distension.     Palpations: Abdomen is soft.     Tenderness: There is no abdominal tenderness.     Comments: Colostomy bag in place  Musculoskeletal:     Cervical back: Normal range of  motion and neck supple.  Lymphadenopathy:     Cervical: No cervical adenopathy.  Skin:    General: Skin is warm and dry.  Neurological:     Mental Status: She is alert and oriented to person, place, and time.  Psychiatric:        Behavior: Behavior normal.           Assessment & Plan:

## 2020-06-27 ENCOUNTER — Encounter: Payer: Self-pay | Admitting: General Practice

## 2020-06-27 ENCOUNTER — Telehealth: Payer: Self-pay | Admitting: Family Medicine

## 2020-06-27 NOTE — Assessment & Plan Note (Signed)
Due to traumatic hematoma.  Needs repeat CBC to ensure Hgb has improved.  Unable to tolerate iron due to colostomy.

## 2020-06-27 NOTE — Assessment & Plan Note (Signed)
Saw Dr Caryl Comes last week and was restarted on Eliquis.  No current signs of bleeding.  Currently in NSR.

## 2020-06-27 NOTE — Telephone Encounter (Signed)
Chart updated to reflect 

## 2020-06-27 NOTE — Assessment & Plan Note (Signed)
Chronic problem, currently on 9mg  daily.  Has f/u w/ Rheum pending.  She is interested in stopping medication entirely.  Will follow along.

## 2020-06-27 NOTE — Assessment & Plan Note (Signed)
Resolved.  Pt has healed remarkably well and reports her hip isn't causing her any pain or problems at this time.

## 2020-06-27 NOTE — Telephone Encounter (Signed)
[name] called back with her Covid Vaccine dates:  12/18/19 and 02/04/2020 - Patient received Pfizer vaccine

## 2020-06-28 DIAGNOSIS — K5901 Slow transit constipation: Secondary | ICD-10-CM | POA: Diagnosis not present

## 2020-06-28 DIAGNOSIS — I739 Peripheral vascular disease, unspecified: Secondary | ICD-10-CM | POA: Diagnosis not present

## 2020-06-28 DIAGNOSIS — D631 Anemia in chronic kidney disease: Secondary | ICD-10-CM | POA: Diagnosis not present

## 2020-06-28 DIAGNOSIS — I480 Paroxysmal atrial fibrillation: Secondary | ICD-10-CM | POA: Diagnosis not present

## 2020-06-28 DIAGNOSIS — S7002XD Contusion of left hip, subsequent encounter: Secondary | ICD-10-CM | POA: Diagnosis not present

## 2020-06-28 DIAGNOSIS — N1831 Chronic kidney disease, stage 3a: Secondary | ICD-10-CM | POA: Diagnosis not present

## 2020-06-28 DIAGNOSIS — M353 Polymyalgia rheumatica: Secondary | ICD-10-CM | POA: Diagnosis not present

## 2020-06-28 DIAGNOSIS — R339 Retention of urine, unspecified: Secondary | ICD-10-CM | POA: Diagnosis not present

## 2020-06-28 DIAGNOSIS — I1310 Hypertensive heart and chronic kidney disease without heart failure, with stage 1 through stage 4 chronic kidney disease, or unspecified chronic kidney disease: Secondary | ICD-10-CM | POA: Diagnosis not present

## 2020-06-30 DIAGNOSIS — K5901 Slow transit constipation: Secondary | ICD-10-CM | POA: Diagnosis not present

## 2020-06-30 DIAGNOSIS — R339 Retention of urine, unspecified: Secondary | ICD-10-CM | POA: Diagnosis not present

## 2020-06-30 DIAGNOSIS — I1310 Hypertensive heart and chronic kidney disease without heart failure, with stage 1 through stage 4 chronic kidney disease, or unspecified chronic kidney disease: Secondary | ICD-10-CM | POA: Diagnosis not present

## 2020-06-30 DIAGNOSIS — I739 Peripheral vascular disease, unspecified: Secondary | ICD-10-CM | POA: Diagnosis not present

## 2020-06-30 DIAGNOSIS — S7002XD Contusion of left hip, subsequent encounter: Secondary | ICD-10-CM | POA: Diagnosis not present

## 2020-06-30 DIAGNOSIS — N1831 Chronic kidney disease, stage 3a: Secondary | ICD-10-CM | POA: Diagnosis not present

## 2020-06-30 DIAGNOSIS — D631 Anemia in chronic kidney disease: Secondary | ICD-10-CM | POA: Diagnosis not present

## 2020-06-30 DIAGNOSIS — I480 Paroxysmal atrial fibrillation: Secondary | ICD-10-CM | POA: Diagnosis not present

## 2020-06-30 DIAGNOSIS — M353 Polymyalgia rheumatica: Secondary | ICD-10-CM | POA: Diagnosis not present

## 2020-07-03 DIAGNOSIS — I739 Peripheral vascular disease, unspecified: Secondary | ICD-10-CM | POA: Diagnosis not present

## 2020-07-03 DIAGNOSIS — D631 Anemia in chronic kidney disease: Secondary | ICD-10-CM | POA: Diagnosis not present

## 2020-07-03 DIAGNOSIS — N1831 Chronic kidney disease, stage 3a: Secondary | ICD-10-CM | POA: Diagnosis not present

## 2020-07-03 DIAGNOSIS — K5901 Slow transit constipation: Secondary | ICD-10-CM | POA: Diagnosis not present

## 2020-07-03 DIAGNOSIS — M353 Polymyalgia rheumatica: Secondary | ICD-10-CM | POA: Diagnosis not present

## 2020-07-03 DIAGNOSIS — R339 Retention of urine, unspecified: Secondary | ICD-10-CM | POA: Diagnosis not present

## 2020-07-03 DIAGNOSIS — I1310 Hypertensive heart and chronic kidney disease without heart failure, with stage 1 through stage 4 chronic kidney disease, or unspecified chronic kidney disease: Secondary | ICD-10-CM | POA: Diagnosis not present

## 2020-07-03 DIAGNOSIS — S7002XD Contusion of left hip, subsequent encounter: Secondary | ICD-10-CM | POA: Diagnosis not present

## 2020-07-03 DIAGNOSIS — I480 Paroxysmal atrial fibrillation: Secondary | ICD-10-CM | POA: Diagnosis not present

## 2020-07-04 DIAGNOSIS — D631 Anemia in chronic kidney disease: Secondary | ICD-10-CM | POA: Diagnosis not present

## 2020-07-04 DIAGNOSIS — M353 Polymyalgia rheumatica: Secondary | ICD-10-CM | POA: Diagnosis not present

## 2020-07-04 DIAGNOSIS — N1831 Chronic kidney disease, stage 3a: Secondary | ICD-10-CM | POA: Diagnosis not present

## 2020-07-04 DIAGNOSIS — I480 Paroxysmal atrial fibrillation: Secondary | ICD-10-CM | POA: Diagnosis not present

## 2020-07-04 DIAGNOSIS — I1310 Hypertensive heart and chronic kidney disease without heart failure, with stage 1 through stage 4 chronic kidney disease, or unspecified chronic kidney disease: Secondary | ICD-10-CM | POA: Diagnosis not present

## 2020-07-04 DIAGNOSIS — K5901 Slow transit constipation: Secondary | ICD-10-CM | POA: Diagnosis not present

## 2020-07-04 DIAGNOSIS — R339 Retention of urine, unspecified: Secondary | ICD-10-CM | POA: Diagnosis not present

## 2020-07-04 DIAGNOSIS — I739 Peripheral vascular disease, unspecified: Secondary | ICD-10-CM | POA: Diagnosis not present

## 2020-07-04 DIAGNOSIS — S7002XD Contusion of left hip, subsequent encounter: Secondary | ICD-10-CM | POA: Diagnosis not present

## 2020-07-06 DIAGNOSIS — S7002XD Contusion of left hip, subsequent encounter: Secondary | ICD-10-CM | POA: Diagnosis not present

## 2020-07-06 DIAGNOSIS — K5901 Slow transit constipation: Secondary | ICD-10-CM | POA: Diagnosis not present

## 2020-07-06 DIAGNOSIS — N1831 Chronic kidney disease, stage 3a: Secondary | ICD-10-CM | POA: Diagnosis not present

## 2020-07-06 DIAGNOSIS — D631 Anemia in chronic kidney disease: Secondary | ICD-10-CM | POA: Diagnosis not present

## 2020-07-06 DIAGNOSIS — I739 Peripheral vascular disease, unspecified: Secondary | ICD-10-CM | POA: Diagnosis not present

## 2020-07-06 DIAGNOSIS — I1310 Hypertensive heart and chronic kidney disease without heart failure, with stage 1 through stage 4 chronic kidney disease, or unspecified chronic kidney disease: Secondary | ICD-10-CM | POA: Diagnosis not present

## 2020-07-06 DIAGNOSIS — M353 Polymyalgia rheumatica: Secondary | ICD-10-CM | POA: Diagnosis not present

## 2020-07-06 DIAGNOSIS — I480 Paroxysmal atrial fibrillation: Secondary | ICD-10-CM | POA: Diagnosis not present

## 2020-07-06 DIAGNOSIS — R339 Retention of urine, unspecified: Secondary | ICD-10-CM | POA: Diagnosis not present

## 2020-07-11 DIAGNOSIS — R339 Retention of urine, unspecified: Secondary | ICD-10-CM | POA: Diagnosis not present

## 2020-07-11 DIAGNOSIS — D631 Anemia in chronic kidney disease: Secondary | ICD-10-CM | POA: Diagnosis not present

## 2020-07-11 DIAGNOSIS — M353 Polymyalgia rheumatica: Secondary | ICD-10-CM | POA: Diagnosis not present

## 2020-07-11 DIAGNOSIS — I1310 Hypertensive heart and chronic kidney disease without heart failure, with stage 1 through stage 4 chronic kidney disease, or unspecified chronic kidney disease: Secondary | ICD-10-CM | POA: Diagnosis not present

## 2020-07-11 DIAGNOSIS — K5901 Slow transit constipation: Secondary | ICD-10-CM | POA: Diagnosis not present

## 2020-07-11 DIAGNOSIS — I480 Paroxysmal atrial fibrillation: Secondary | ICD-10-CM | POA: Diagnosis not present

## 2020-07-11 DIAGNOSIS — N1831 Chronic kidney disease, stage 3a: Secondary | ICD-10-CM | POA: Diagnosis not present

## 2020-07-11 DIAGNOSIS — I739 Peripheral vascular disease, unspecified: Secondary | ICD-10-CM | POA: Diagnosis not present

## 2020-07-11 DIAGNOSIS — S7002XD Contusion of left hip, subsequent encounter: Secondary | ICD-10-CM | POA: Diagnosis not present

## 2020-07-12 DIAGNOSIS — N1831 Chronic kidney disease, stage 3a: Secondary | ICD-10-CM | POA: Diagnosis not present

## 2020-07-12 DIAGNOSIS — R339 Retention of urine, unspecified: Secondary | ICD-10-CM | POA: Diagnosis not present

## 2020-07-12 DIAGNOSIS — I480 Paroxysmal atrial fibrillation: Secondary | ICD-10-CM | POA: Diagnosis not present

## 2020-07-12 DIAGNOSIS — K5901 Slow transit constipation: Secondary | ICD-10-CM | POA: Diagnosis not present

## 2020-07-12 DIAGNOSIS — I1310 Hypertensive heart and chronic kidney disease without heart failure, with stage 1 through stage 4 chronic kidney disease, or unspecified chronic kidney disease: Secondary | ICD-10-CM | POA: Diagnosis not present

## 2020-07-12 DIAGNOSIS — I739 Peripheral vascular disease, unspecified: Secondary | ICD-10-CM | POA: Diagnosis not present

## 2020-07-12 DIAGNOSIS — D631 Anemia in chronic kidney disease: Secondary | ICD-10-CM | POA: Diagnosis not present

## 2020-07-12 DIAGNOSIS — M353 Polymyalgia rheumatica: Secondary | ICD-10-CM | POA: Diagnosis not present

## 2020-07-12 DIAGNOSIS — S7002XD Contusion of left hip, subsequent encounter: Secondary | ICD-10-CM | POA: Diagnosis not present

## 2020-07-13 DIAGNOSIS — I1310 Hypertensive heart and chronic kidney disease without heart failure, with stage 1 through stage 4 chronic kidney disease, or unspecified chronic kidney disease: Secondary | ICD-10-CM | POA: Diagnosis not present

## 2020-07-13 DIAGNOSIS — I480 Paroxysmal atrial fibrillation: Secondary | ICD-10-CM | POA: Diagnosis not present

## 2020-07-13 DIAGNOSIS — M353 Polymyalgia rheumatica: Secondary | ICD-10-CM | POA: Diagnosis not present

## 2020-07-13 DIAGNOSIS — K5901 Slow transit constipation: Secondary | ICD-10-CM | POA: Diagnosis not present

## 2020-07-13 DIAGNOSIS — N1831 Chronic kidney disease, stage 3a: Secondary | ICD-10-CM | POA: Diagnosis not present

## 2020-07-13 DIAGNOSIS — S7002XD Contusion of left hip, subsequent encounter: Secondary | ICD-10-CM | POA: Diagnosis not present

## 2020-07-13 DIAGNOSIS — I739 Peripheral vascular disease, unspecified: Secondary | ICD-10-CM | POA: Diagnosis not present

## 2020-07-13 DIAGNOSIS — R339 Retention of urine, unspecified: Secondary | ICD-10-CM | POA: Diagnosis not present

## 2020-07-13 DIAGNOSIS — D631 Anemia in chronic kidney disease: Secondary | ICD-10-CM | POA: Diagnosis not present

## 2020-07-19 DIAGNOSIS — I1310 Hypertensive heart and chronic kidney disease without heart failure, with stage 1 through stage 4 chronic kidney disease, or unspecified chronic kidney disease: Secondary | ICD-10-CM | POA: Diagnosis not present

## 2020-07-19 DIAGNOSIS — D631 Anemia in chronic kidney disease: Secondary | ICD-10-CM | POA: Diagnosis not present

## 2020-07-19 DIAGNOSIS — K5901 Slow transit constipation: Secondary | ICD-10-CM | POA: Diagnosis not present

## 2020-07-19 DIAGNOSIS — S7002XD Contusion of left hip, subsequent encounter: Secondary | ICD-10-CM | POA: Diagnosis not present

## 2020-07-19 DIAGNOSIS — I739 Peripheral vascular disease, unspecified: Secondary | ICD-10-CM | POA: Diagnosis not present

## 2020-07-19 DIAGNOSIS — R339 Retention of urine, unspecified: Secondary | ICD-10-CM | POA: Diagnosis not present

## 2020-07-19 DIAGNOSIS — N1831 Chronic kidney disease, stage 3a: Secondary | ICD-10-CM | POA: Diagnosis not present

## 2020-07-19 DIAGNOSIS — M353 Polymyalgia rheumatica: Secondary | ICD-10-CM | POA: Diagnosis not present

## 2020-07-19 DIAGNOSIS — I480 Paroxysmal atrial fibrillation: Secondary | ICD-10-CM | POA: Diagnosis not present

## 2020-07-21 ENCOUNTER — Telehealth: Payer: Self-pay

## 2020-07-21 DIAGNOSIS — Z939 Artificial opening status, unspecified: Secondary | ICD-10-CM | POA: Diagnosis not present

## 2020-07-21 DIAGNOSIS — Z9049 Acquired absence of other specified parts of digestive tract: Secondary | ICD-10-CM | POA: Diagnosis not present

## 2020-07-21 DIAGNOSIS — Z933 Colostomy status: Secondary | ICD-10-CM | POA: Diagnosis not present

## 2020-07-21 DIAGNOSIS — I480 Paroxysmal atrial fibrillation: Secondary | ICD-10-CM | POA: Diagnosis not present

## 2020-07-21 DIAGNOSIS — Z7902 Long term (current) use of antithrombotics/antiplatelets: Secondary | ICD-10-CM | POA: Diagnosis not present

## 2020-07-21 NOTE — Telephone Encounter (Signed)
   [facility] Pre-operative Risk Assessment    HEARTCARE STAFF: - Please ensure there is not already an duplicate clearance open for this procedure. - Under Visit Info/Reason for Call, type in Other and utilize the format Clearance MM/DD/YY or Clearance TBD. Do not use dashes or single digits. - If request is for dental extraction, please clarify the # of teeth to be extracted.  Request for surgical clearance:  1. What type of surgery is being performed? Ostomy Reversal   2. When is this surgery scheduled? TBD   3. What type of clearance is required (medical clearance vs. Pharmacy clearance to hold med vs. Both)? Both  4. Are there any medications that need to be held prior to surgery and how long? When and how long to hold Eliquis   5. Practice name and name of physician performing surgery? [facility], Dr. Greer Pickerel   6. What is the office phone number? [phone]   7.   What is the office fax number? [name], [name]  8.   Anesthesia type (None, local, MAC, general) ? General   [name] 07/21/2020, 3:12 PM  _________________________________________________________________   (provider comments below)

## 2020-07-24 DIAGNOSIS — R339 Retention of urine, unspecified: Secondary | ICD-10-CM | POA: Diagnosis not present

## 2020-07-24 DIAGNOSIS — I480 Paroxysmal atrial fibrillation: Secondary | ICD-10-CM | POA: Diagnosis not present

## 2020-07-24 DIAGNOSIS — K5901 Slow transit constipation: Secondary | ICD-10-CM | POA: Diagnosis not present

## 2020-07-24 DIAGNOSIS — N1831 Chronic kidney disease, stage 3a: Secondary | ICD-10-CM | POA: Diagnosis not present

## 2020-07-24 DIAGNOSIS — I1310 Hypertensive heart and chronic kidney disease without heart failure, with stage 1 through stage 4 chronic kidney disease, or unspecified chronic kidney disease: Secondary | ICD-10-CM | POA: Diagnosis not present

## 2020-07-24 DIAGNOSIS — S7002XD Contusion of left hip, subsequent encounter: Secondary | ICD-10-CM | POA: Diagnosis not present

## 2020-07-24 DIAGNOSIS — D631 Anemia in chronic kidney disease: Secondary | ICD-10-CM | POA: Diagnosis not present

## 2020-07-24 DIAGNOSIS — I739 Peripheral vascular disease, unspecified: Secondary | ICD-10-CM | POA: Diagnosis not present

## 2020-07-24 DIAGNOSIS — M353 Polymyalgia rheumatica: Secondary | ICD-10-CM | POA: Diagnosis not present

## 2020-07-24 NOTE — Telephone Encounter (Signed)
Patient with diagnosis of afib and provoked DVT in 2016 on Eliquis for anticoagulation.    Procedure: ostomy reversal Date of procedure: TBD  CHADS2-VASc score of 5 (age x2, sex, HTN, PVD). Underwent DCCV 6/21, had a fall and hematoma 7/21, Eliquis was held at the time.  CrCl 10mL/min Platelet count 306K  Per office protocol, patient can hold Eliquis for 2-3 days prior to procedure.

## 2020-07-24 NOTE — Telephone Encounter (Signed)
Clinical pharmacist to review Eliquis 

## 2020-07-25 ENCOUNTER — Other Ambulatory Visit: Payer: Self-pay | Admitting: General Surgery

## 2020-07-25 DIAGNOSIS — I1310 Hypertensive heart and chronic kidney disease without heart failure, with stage 1 through stage 4 chronic kidney disease, or unspecified chronic kidney disease: Secondary | ICD-10-CM | POA: Diagnosis not present

## 2020-07-25 DIAGNOSIS — S7002XD Contusion of left hip, subsequent encounter: Secondary | ICD-10-CM | POA: Diagnosis not present

## 2020-07-25 DIAGNOSIS — K5901 Slow transit constipation: Secondary | ICD-10-CM | POA: Diagnosis not present

## 2020-07-25 DIAGNOSIS — N1831 Chronic kidney disease, stage 3a: Secondary | ICD-10-CM | POA: Diagnosis not present

## 2020-07-25 DIAGNOSIS — Z9049 Acquired absence of other specified parts of digestive tract: Secondary | ICD-10-CM

## 2020-07-25 DIAGNOSIS — I480 Paroxysmal atrial fibrillation: Secondary | ICD-10-CM | POA: Diagnosis not present

## 2020-07-25 DIAGNOSIS — M353 Polymyalgia rheumatica: Secondary | ICD-10-CM | POA: Diagnosis not present

## 2020-07-25 DIAGNOSIS — D631 Anemia in chronic kidney disease: Secondary | ICD-10-CM | POA: Diagnosis not present

## 2020-07-25 DIAGNOSIS — I739 Peripheral vascular disease, unspecified: Secondary | ICD-10-CM | POA: Diagnosis not present

## 2020-07-25 DIAGNOSIS — R339 Retention of urine, unspecified: Secondary | ICD-10-CM | POA: Diagnosis not present

## 2020-07-25 NOTE — Telephone Encounter (Signed)
Called and spoke to pt   she iis limited after her fall but without complaints of SOB or chest pain and no edema  She strongly desires the colostomy to be taken and has no acute instablity so suspect will be at increased but acceptable risk for her surgery

## 2020-07-25 NOTE — Telephone Encounter (Signed)
I spoke with [name], she denies any recent chest pain, shortness of breath or recurrent palpitation. She is recovering well after the recent fall and hematoma. Although she had to hold Eliquis for 1 month, she has since restarted on the Eliquis. Last echocardiogram was reassuring.  Given her advanced age, she would be at least moderate risk for the intended procedure, I will forward to Dr. Caryl Comes for final approval.

## 2020-07-25 NOTE — Telephone Encounter (Signed)
Sent to the requesting provider. Callback pool to remind the patient to hold Eliquis for 2-3 days prior to surgery and will defer to the surgeon the earliest time to restart Eliquis after the procedure.

## 2020-07-25 NOTE — Telephone Encounter (Signed)
Spoke with pt and made her aware of recommendations on Eliquis.  Pt states she received a call from [facility] earlier with the same information.  Pt appreciative for call.

## 2020-07-26 DIAGNOSIS — I1310 Hypertensive heart and chronic kidney disease without heart failure, with stage 1 through stage 4 chronic kidney disease, or unspecified chronic kidney disease: Secondary | ICD-10-CM | POA: Diagnosis not present

## 2020-07-26 DIAGNOSIS — S7002XD Contusion of left hip, subsequent encounter: Secondary | ICD-10-CM | POA: Diagnosis not present

## 2020-07-26 DIAGNOSIS — R339 Retention of urine, unspecified: Secondary | ICD-10-CM | POA: Diagnosis not present

## 2020-07-26 DIAGNOSIS — I739 Peripheral vascular disease, unspecified: Secondary | ICD-10-CM | POA: Diagnosis not present

## 2020-07-26 DIAGNOSIS — K5901 Slow transit constipation: Secondary | ICD-10-CM | POA: Diagnosis not present

## 2020-07-26 DIAGNOSIS — M353 Polymyalgia rheumatica: Secondary | ICD-10-CM | POA: Diagnosis not present

## 2020-07-26 DIAGNOSIS — D631 Anemia in chronic kidney disease: Secondary | ICD-10-CM | POA: Diagnosis not present

## 2020-07-26 DIAGNOSIS — I480 Paroxysmal atrial fibrillation: Secondary | ICD-10-CM | POA: Diagnosis not present

## 2020-07-26 DIAGNOSIS — N1831 Chronic kidney disease, stage 3a: Secondary | ICD-10-CM | POA: Diagnosis not present

## 2020-07-31 ENCOUNTER — Telehealth: Payer: Self-pay | Admitting: Family Medicine

## 2020-07-31 ENCOUNTER — Ambulatory Visit
Admission: RE | Admit: 2020-07-31 | Discharge: 2020-07-31 | Disposition: A | Discharge status: Home / Self Care | Payer: Medicare PPO | Source: Ambulatory Visit | Attending: General Surgery | Admitting: General Surgery

## 2020-07-31 DIAGNOSIS — S7002XD Contusion of left hip, subsequent encounter: Secondary | ICD-10-CM | POA: Diagnosis not present

## 2020-07-31 DIAGNOSIS — Z933 Colostomy status: Secondary | ICD-10-CM

## 2020-07-31 DIAGNOSIS — K5730 Diverticulosis of large intestine without perforation or abscess without bleeding: Secondary | ICD-10-CM | POA: Diagnosis not present

## 2020-07-31 DIAGNOSIS — Z85820 Personal history of malignant melanoma of skin: Secondary | ICD-10-CM

## 2020-07-31 DIAGNOSIS — I480 Paroxysmal atrial fibrillation: Secondary | ICD-10-CM | POA: Diagnosis not present

## 2020-07-31 DIAGNOSIS — I951 Orthostatic hypotension: Secondary | ICD-10-CM

## 2020-07-31 DIAGNOSIS — I1310 Hypertensive heart and chronic kidney disease without heart failure, with stage 1 through stage 4 chronic kidney disease, or unspecified chronic kidney disease: Secondary | ICD-10-CM | POA: Diagnosis not present

## 2020-07-31 DIAGNOSIS — K5901 Slow transit constipation: Secondary | ICD-10-CM | POA: Diagnosis not present

## 2020-07-31 DIAGNOSIS — Z86718 Personal history of other venous thrombosis and embolism: Secondary | ICD-10-CM

## 2020-07-31 DIAGNOSIS — R339 Retention of urine, unspecified: Secondary | ICD-10-CM | POA: Diagnosis not present

## 2020-07-31 DIAGNOSIS — M353 Polymyalgia rheumatica: Secondary | ICD-10-CM | POA: Diagnosis not present

## 2020-07-31 DIAGNOSIS — Z9049 Acquired absence of other specified parts of digestive tract: Secondary | ICD-10-CM

## 2020-07-31 DIAGNOSIS — K5720 Diverticulitis of large intestine with perforation and abscess without bleeding: Secondary | ICD-10-CM

## 2020-07-31 DIAGNOSIS — I739 Peripheral vascular disease, unspecified: Secondary | ICD-10-CM | POA: Diagnosis not present

## 2020-07-31 DIAGNOSIS — Z9181 History of falling: Secondary | ICD-10-CM

## 2020-07-31 DIAGNOSIS — D631 Anemia in chronic kidney disease: Secondary | ICD-10-CM | POA: Diagnosis not present

## 2020-07-31 DIAGNOSIS — K219 Gastro-esophageal reflux disease without esophagitis: Secondary | ICD-10-CM

## 2020-07-31 DIAGNOSIS — N1831 Chronic kidney disease, stage 3a: Secondary | ICD-10-CM | POA: Diagnosis not present

## 2020-07-31 DIAGNOSIS — G629 Polyneuropathy, unspecified: Secondary | ICD-10-CM

## 2020-07-31 NOTE — Telephone Encounter (Signed)
Paperwork will be given to PCP.

## 2020-07-31 NOTE — Telephone Encounter (Signed)
Home Health Form placed in Dr. Tabori's bin up front °

## 2020-08-01 NOTE — Telephone Encounter (Signed)
FYI

## 2020-08-01 NOTE — Telephone Encounter (Signed)
Form completed and placed in basket  

## 2020-08-01 NOTE — Telephone Encounter (Signed)
Faxed and made copy to send to scanning

## 2020-08-02 DIAGNOSIS — I480 Paroxysmal atrial fibrillation: Secondary | ICD-10-CM | POA: Diagnosis not present

## 2020-08-02 DIAGNOSIS — R339 Retention of urine, unspecified: Secondary | ICD-10-CM | POA: Diagnosis not present

## 2020-08-02 DIAGNOSIS — I1310 Hypertensive heart and chronic kidney disease without heart failure, with stage 1 through stage 4 chronic kidney disease, or unspecified chronic kidney disease: Secondary | ICD-10-CM | POA: Diagnosis not present

## 2020-08-02 DIAGNOSIS — S7002XD Contusion of left hip, subsequent encounter: Secondary | ICD-10-CM | POA: Diagnosis not present

## 2020-08-02 DIAGNOSIS — M353 Polymyalgia rheumatica: Secondary | ICD-10-CM | POA: Diagnosis not present

## 2020-08-02 DIAGNOSIS — I739 Peripheral vascular disease, unspecified: Secondary | ICD-10-CM | POA: Diagnosis not present

## 2020-08-02 DIAGNOSIS — K5901 Slow transit constipation: Secondary | ICD-10-CM | POA: Diagnosis not present

## 2020-08-02 DIAGNOSIS — N1831 Chronic kidney disease, stage 3a: Secondary | ICD-10-CM | POA: Diagnosis not present

## 2020-08-02 DIAGNOSIS — D631 Anemia in chronic kidney disease: Secondary | ICD-10-CM | POA: Diagnosis not present

## 2020-08-03 DIAGNOSIS — S56312A Strain of extensor or abductor muscles, fascia and tendons of left thumb at forearm level, initial encounter: Secondary | ICD-10-CM | POA: Diagnosis not present

## 2020-08-03 DIAGNOSIS — S56309A Unspecified injury of extensor or abductor muscles, fascia and tendons of unspecified thumb at forearm level, initial encounter: Secondary | ICD-10-CM | POA: Insufficient documentation

## 2020-08-03 DIAGNOSIS — M79645 Pain in left finger(s): Secondary | ICD-10-CM | POA: Diagnosis not present

## 2020-08-07 DIAGNOSIS — D631 Anemia in chronic kidney disease: Secondary | ICD-10-CM | POA: Diagnosis not present

## 2020-08-07 DIAGNOSIS — K5901 Slow transit constipation: Secondary | ICD-10-CM | POA: Diagnosis not present

## 2020-08-07 DIAGNOSIS — I480 Paroxysmal atrial fibrillation: Secondary | ICD-10-CM | POA: Diagnosis not present

## 2020-08-07 DIAGNOSIS — S7002XD Contusion of left hip, subsequent encounter: Secondary | ICD-10-CM | POA: Diagnosis not present

## 2020-08-07 DIAGNOSIS — I1310 Hypertensive heart and chronic kidney disease without heart failure, with stage 1 through stage 4 chronic kidney disease, or unspecified chronic kidney disease: Secondary | ICD-10-CM | POA: Diagnosis not present

## 2020-08-07 DIAGNOSIS — N1831 Chronic kidney disease, stage 3a: Secondary | ICD-10-CM | POA: Diagnosis not present

## 2020-08-07 DIAGNOSIS — I739 Peripheral vascular disease, unspecified: Secondary | ICD-10-CM | POA: Diagnosis not present

## 2020-08-07 DIAGNOSIS — R339 Retention of urine, unspecified: Secondary | ICD-10-CM | POA: Diagnosis not present

## 2020-08-07 DIAGNOSIS — M353 Polymyalgia rheumatica: Secondary | ICD-10-CM | POA: Diagnosis not present

## 2020-08-14 DIAGNOSIS — M150 Primary generalized (osteo)arthritis: Secondary | ICD-10-CM | POA: Diagnosis not present

## 2020-08-14 DIAGNOSIS — Z7952 Long term (current) use of systemic steroids: Secondary | ICD-10-CM | POA: Diagnosis not present

## 2020-08-14 DIAGNOSIS — M353 Polymyalgia rheumatica: Secondary | ICD-10-CM | POA: Diagnosis not present

## 2020-08-14 DIAGNOSIS — Z6824 Body mass index (BMI) 24.0-24.9, adult: Secondary | ICD-10-CM | POA: Diagnosis not present

## 2020-08-16 DIAGNOSIS — S7002XD Contusion of left hip, subsequent encounter: Secondary | ICD-10-CM | POA: Diagnosis not present

## 2020-08-16 DIAGNOSIS — R339 Retention of urine, unspecified: Secondary | ICD-10-CM | POA: Diagnosis not present

## 2020-08-16 DIAGNOSIS — I480 Paroxysmal atrial fibrillation: Secondary | ICD-10-CM | POA: Diagnosis not present

## 2020-08-16 DIAGNOSIS — M353 Polymyalgia rheumatica: Secondary | ICD-10-CM | POA: Diagnosis not present

## 2020-08-16 DIAGNOSIS — N1831 Chronic kidney disease, stage 3a: Secondary | ICD-10-CM | POA: Diagnosis not present

## 2020-08-16 DIAGNOSIS — K5901 Slow transit constipation: Secondary | ICD-10-CM | POA: Diagnosis not present

## 2020-08-16 DIAGNOSIS — D631 Anemia in chronic kidney disease: Secondary | ICD-10-CM | POA: Diagnosis not present

## 2020-08-16 DIAGNOSIS — I1310 Hypertensive heart and chronic kidney disease without heart failure, with stage 1 through stage 4 chronic kidney disease, or unspecified chronic kidney disease: Secondary | ICD-10-CM | POA: Diagnosis not present

## 2020-08-16 DIAGNOSIS — I739 Peripheral vascular disease, unspecified: Secondary | ICD-10-CM | POA: Diagnosis not present

## 2020-08-24 ENCOUNTER — Encounter: Payer: Self-pay | Admitting: Surgery

## 2020-08-24 ENCOUNTER — Ambulatory Visit: Payer: Self-pay | Admitting: Surgery

## 2020-08-24 DIAGNOSIS — Z7901 Long term (current) use of anticoagulants: Secondary | ICD-10-CM | POA: Diagnosis not present

## 2020-08-24 DIAGNOSIS — Z8719 Personal history of other diseases of the digestive system: Secondary | ICD-10-CM | POA: Diagnosis not present

## 2020-08-24 DIAGNOSIS — Z933 Colostomy status: Secondary | ICD-10-CM | POA: Diagnosis not present

## 2020-08-24 DIAGNOSIS — F1920 Other psychoactive substance dependence, uncomplicated: Secondary | ICD-10-CM | POA: Diagnosis not present

## 2020-08-24 DIAGNOSIS — M353 Polymyalgia rheumatica: Secondary | ICD-10-CM | POA: Diagnosis not present

## 2020-08-24 DIAGNOSIS — Z8679 Personal history of other diseases of the circulatory system: Secondary | ICD-10-CM | POA: Diagnosis not present

## 2020-08-24 NOTE — H&P (Signed)
[name] Appointment: 08/24/2020 11:00 AM Location: [facility] Patient #: [id number] DOB: 08-04-1923 Widowed / Language: Cleophus Molt / Race: White Female  History of Present Illness [name] MD; 08/24/2020 1:18 PM) The patient is a 86 year old female who presents with diverticulitis. Note for "Diverticulitis": ` ` ` Patient sent for surgical consultation at the request of Dr Redmond Pulling  Chief Complaint: Desire for colostomy takedown. History of emergency Hartmann resection for perforated diverticulitis ` ` The patient is a 86 year old female. She is here with her son and daughter. Another one has healthcare power of attorney but is not here today. She required emergency Hartmann colon resection for colon perforation in February 2020 by my partner, Dr. Greer Pickerel. She had had a arm fracture requiring surgery. Seems like she developed an ileus and diverticulitis. Perforation. Resection with colostomy. She was in the hospital for over a week but eventually stabilized and recovered. She had an open wound to close down. She has a colostomy. She followed up with the initial surgeon and wished to have colostomy takedown. Given her advanced age, patient was referred to me for consideration. Patient did go into atrial fibrillation at the time of her emergency surgery and is on amiodarone. She is anticoagulated on Eliquis. No cardiac events since. She is followed by Dr. Virl Axe who is seen her and feels like she is increased but acceptable risk for surgery. Do not think she's had a colonoscopy a while but she did get a barium enema preoperatively that showed no obvious lesions.  Patient uses a walker for balance issues but claims she can walk 20 minutes easily. She does not smoke. No diabetes. No personal nor family history of GI/colon cancer, inflammatory bowel disease, irritable bowel syndrome, allergy such as Celiac Sprue, dietary/dairy problems, colitis,  ulcers nor gastritis. No recent sick contacts/gastroenteritis. No travel outside the country. No changes in diet. No dysphagia to solids or liquids. No significant heartburn or reflux. No melena, hematemesis, coffee ground emesis. No evidence of prior gastric/peptic ulceration.  (Review of systems as stated in this history (HPI) or in the review of systems. Otherwise all other 12 point ROS are negative) ` ` ###########################################`  This patient encounter took 55 minutes today to perform the following: obtain history, perform exam, review outside records, interpret tests & imaging, counsel the patient on their diagnosis; and, document this encounter, including findings & plan in the electronic health record (EHR).   Allergies Darden Palmer, [place]; 08/24/2020 11:09 AM) Codeine Phosphate *ANALGESICS - OPIOID* Keflex *CEPHALOSPORINS* Allergies Reconciled  Medication History Darden Palmer, RMA; 08/24/2020 11:10 AM) Cefdinir (300MG  Capsule, Oral) Active. dilTIAZem HCl ER Coated Beads (120MG  Capsule ER 24HR, Oral) Active. Eliquis (5MG  Tablet, Oral) Active. Gabapentin (300MG  Capsule, Oral) Active. Levothyroxine Sodium (50MCG Tablet, Oral) Active. Metoprolol Tartrate (25MG  Tablet, Oral) Active. Nadolol (80MG  Tablet, Oral) Active. Omeprazole (20MG  Capsule DR, Oral) Active. Ondansetron HCl (4MG  Tablet, Oral) Active. predniSONE (5MG  Tablet, Oral) Active. Medications Reconciled    Vitals Lattie Haw Caldwell RMA; 08/24/2020 11:11 AM) 08/24/2020 11:11 AM Weight: 152.38 lb Height: 66in Body Surface Area: 1.78 m Body Mass Index: 24.59 kg/m  Temp.: 97.56F  Pulse: 61 (Regular)  P.OX: 96% (Room air) BP: 120/76(Sitting, Left Arm, Standard)        Physical Exam [name] MD; 08/24/2020 12:53 PM)  General Mental Status-Alert. General Appearance-Not in acute distress, Not Sickly. Orientation-Oriented X3. Hydration-Well  hydrated. Voice-Normal. Note: Bright and alert. Moves up to bed easily without any assistance. Well-developed.  Well-nourished. Uses walker for balance  Integumentary Global Assessment Upon inspection and palpation of skin surfaces of the - Axillae: non-tender, no inflammation or ulceration, no drainage. and Distribution of scalp and body hair is normal. General Characteristics Temperature - normal warmth is noted.  Head and Neck Head-normocephalic, atraumatic with no lesions or palpable masses. Face Global Assessment - atraumatic, no absence of expression. Neck Global Assessment - no abnormal movements, no bruit auscultated on the right, no bruit auscultated on the left, no decreased range of motion, non-tender. Trachea-midline. Thyroid Gland Characteristics - non-tender.  Eye Eyeball - Left-Extraocular movements intact, No Nystagmus - Left. Eyeball - Right-Extraocular movements intact, No Nystagmus - Right. Cornea - Left-No Hazy - Left. Cornea - Right-No Hazy - Right. Sclera/Conjunctiva - Left-No scleral icterus, No Discharge - Left. Sclera/Conjunctiva - Right-No scleral icterus, No Discharge - Right. Pupil - Left-Direct reaction to light normal. Pupil - Right-Direct reaction to light normal.  ENMT Ears Pinna - Left - no drainage observed, no generalized tenderness observed. Pinna - Right - no drainage observed, no generalized tenderness observed. Nose and Sinuses External Inspection of the Nose - no destructive lesion observed. Inspection of the nares - Left - quiet respiration. Inspection of the nares - Right - quiet respiration. Mouth and Throat Lips - Upper Lip - no fissures observed, no pallor noted. Lower Lip - no fissures observed, no pallor noted. Nasopharynx - no discharge present. Oral Cavity/Oropharynx - Tongue - no dryness observed. Oral Mucosa - no cyanosis observed. Hypopharynx - no evidence of airway distress observed.  Chest and Lung  Exam Inspection Movements - Normal and Symmetrical. Accessory muscles - No use of accessory muscles in breathing. Palpation Palpation of the chest reveals - Non-tender. Auscultation Breath sounds - Normal and Clear.  Cardiovascular Auscultation Rhythm - Regular. Murmurs & Other Heart Sounds - Auscultation of the heart reveals - No Murmurs and No Systolic Clicks.  Abdomen Inspection Inspection of the abdomen reveals - No Visible peristalsis and No Abnormal pulsations. Umbilicus - No Bleeding, No Urine drainage. Palpation/Percussion Palpation and Percussion of the abdomen reveal - Soft, Non Tender, No Rebound tenderness, No Rigidity (guarding) and No Cutaneous hyperesthesia. Note: Abdomen soft. Not severely distended. Colostomy in place without any parastomal hernia. Midline incision closed with no hernia. No diastasis recti. No umbilical or other anterior abdominal wall hernias  Female Genitourinary Sexual Maturity Tanner 5 - Adult hair pattern. Note: No vaginal bleeding nor discharge  Rectal Note: Perianal skin clear with minimal anal tags. Normal sphincter tone. No fissure or fistula. Tolerates digital exam. Grade 2 internal hemorrhoids. No obvious retained wax or stool. No masses felt to 6 cm circumferentially.  Peripheral Vascular Upper Extremity Inspection - Left - No Cyanotic nailbeds - Left, Not Ischemic. Inspection - Right - No Cyanotic nailbeds - Right, Not Ischemic.  Neurologic Neurologic evaluation reveals -normal attention span and ability to concentrate, able to name objects and repeat phrases. Appropriate fund of knowledge , normal sensation and normal coordination. Mental Status Affect - not angry, not paranoid. Cranial Nerves-Normal Bilaterally. Gait-Normal.  Neuropsychiatric Mental status exam performed with findings of-able to articulate well with normal speech/language, rate, volume and coherence, thought content normal with ability to perform  basic computations and apply abstract reasoning and no evidence of hallucinations, delusions, obsessions or homicidal/suicidal ideation.  Musculoskeletal Global Assessment Spine, Ribs and Pelvis - no instability, subluxation or laxity. Right Upper Extremity - no instability, subluxation or laxity.  Lymphatic Head & Neck  General Head & Neck  Lymphatics: Bilateral - Description - No Localized lymphadenopathy. Axillary  General Axillary Region: Bilateral - Description - No Localized lymphadenopathy. Femoral & Inguinal  Generalized Femoral & Inguinal Lymphatics: Left - Description - No Localized lymphadenopathy. Right - Description - No Localized lymphadenopathy.    Assessment & Plan [name] MD; 08/24/2020 1:29 PM)  STATUS POST COLOSTOMY (Z93.3)   HISTORY OF DIVERTICULITIS OF COLON (Z87.19) Impression: History of perforated diverticulitis requiring emergency Hartmann resection. Despite her very advanced age, she is recovered rather well and actually is pretty functional with a good quality of life. She wishes to consider colostomy takedown. There are operative risks are increased given her advanced age, she got through emergency surgery earlier this year rather well. She is followed by cardiology and is felt to be a reasonable risk. A long discussion the patient and her children noting that risks of morbidity mortality or increased a 86 year old. However she has good exercise tolerance is otherwise well with a decent quality of life. She wishes to be aggressive and get her colostomy taken down. Her children seemed to wished honor her request.  Once her steroids are weaned down to 5 mg, can proceed with colostomy takedown. Patient anticipates being down to that dose in late December. Most likely that means will do in early January.  Would hold off on colonoscopy since her barium enema was underwhelming. We'll do rigid proctoscopy at the start of the case to rule out any endoluminal  rectal masses although none was obvious distally on digital exam nor anything huge on CAT scan.  Current Plans Pt Education - [facility] Diverticular Disease (AT)  PREOP COLON - ENCOUNTER FOR PREOPERATIVE EXAMINATION FOR GENERAL SURGICAL PROCEDURE (Z01.818)  Current Plans You are being scheduled for surgery- Our schedulers will call you.  Plan to consider robotic-assisted colostomy takedown once you have weaned on her prednisone down to 5 mg per Dr. Trudie Reed. Tentative plan in [place] should hear from our office's scheduling department within 5 working days about the location, date, and time of surgery. We try to make accommodations for patient's preferences in scheduling surgery, but sometimes the OR schedule or the surgeon's schedule prevents [place] from making those accommodations.  If you have not heard from our office [phone]) in 5 working days, call the office and ask for your surgeon's nurse.  If you have other questions about your diagnosis, plan, or surgery, call the office and ask for your surgeon's nurse.  Written instructions provided The anatomy & physiology of the digestive tract was discussed. The pathophysiology of the colon was discussed. Natural history risks without surgery was discussed. I feel the risks of no intervention will lead to serious problems that outweigh the operative risks; therefore, I recommended a partial colectomy to remove the pathology. Minimally invasive (Robotic/Laparoscopic) & open techniques were discussed.  Risks such as bleeding, infection, abscess, leak, reoperation, possible ostomy, hernia, heart attack, death, and other risks were discussed. I noted a good likelihood this will help address the problem. Goals of post-operative recovery were discussed as well. Need for adequate nutrition, daily bowel regimen and healthy physical activity, to optimize recovery was noted as well. We will work to minimize complications. Educational  materials were available as well. Questions were answered. The patient expresses understanding & wishes to proceed with surgery.  Pt Education - [facility] Colon Bowel Prep 2018 ERAS/Miralax/Antibiotics Started Neomycin Sulfate 500 MG Oral Tablet, 2 (two) Tablet SEE NOTE, #6, 08/24/2020, No Refill. Local Order: Pharmacist Notes: TAKE TWO TABLETS  AT 2 PM, 3 PM, AND 10 PM THE DAY PRIOR TO SURGERY Started Flagyl 500 MG Oral Tablet, 2 (two) Tablet SEE NOTE, #6, 08/24/2020, No Refill. Local Order: Pharmacist Notes: Take at 2pm, 3pm, and 10pm the day prior to your colon operation Pt Education - Pamphlet Given - Laparoscopic Colorectal Surgery: discussed with patient and provided information. Pt Education - [facility] Colectomy post-op instructions: discussed with patient and provided information.  POLYMYALGIA RHEUMATICA (M35.3) Impression: Polymyalgia controlled with immunosuppression. Her rheumatologist try to wean her steroids.   STEROID DEPENDENCE (F19.20) Impression: Surgery dependent rheumatoid issues. Pulmonology.  I agree with her rheumatologist that her operative risk will be minimized her prednisone can come down to 5 mg. They're doing a very slow taper for this. Once she is down to 5 mg, consider colostomy takedown. Hopefully more likely to be successful with decreasing anastomotic leak. Patient believes she'll be down to 5 mg in late December.   HISTORY OF ATRIAL FIBRILLATION (Z86.79)   ANTICOAGULATED (Z79.01) Impression: Anticoagulated on Eliquis for atrial fibrillation. Rate controlled. Should be able to hold Eliquis 2 days preop. Already cleared by cardiology. Actually has normal ejection fraction based on echocardiogram in July  Current Plans I recommended obtaining preoperative cardiac clearance. I am concerned about the health of the patient and the ability to tolerate the operation. Therefore, we will request clearance by cardiology to better assess operative risk & see if a  reevaluation, further workup, etc is needed. Also recommendations on how medications such as for anticoagulation and blood pressure should be managed/held/restarted after surgery. Pt Education - [facility] Hold anticoagulation preoperatively  [name], MD, FACS, MASCRS Gastrointestinal and Minimally Invasive Surgery  [facility] [address] [place], [place] [address] [phone] Fax [phone] Main/Paging  CONTACT INFORMATION: Weekday (9AM-5PM) concerns: Call [facility] main office at [phone] Weeknight (5PM-9AM) or Weekend/Holiday concerns: Check [email or web address] for General Surgery [facility] coverage (Please, do not use SecureChat as it is not reliable communication to operating surgeons for immediate patient care)

## 2020-08-25 DIAGNOSIS — Z961 Presence of intraocular lens: Secondary | ICD-10-CM | POA: Diagnosis not present

## 2020-08-25 DIAGNOSIS — H353131 Nonexudative age-related macular degeneration, bilateral, early dry stage: Secondary | ICD-10-CM | POA: Diagnosis not present

## 2020-09-04 ENCOUNTER — Telehealth: Payer: Self-pay | Admitting: Family Medicine

## 2020-09-04 DIAGNOSIS — W19XXXA Unspecified fall, initial encounter: Secondary | ICD-10-CM

## 2020-09-04 NOTE — Telephone Encounter (Signed)
This referral was placed today.  

## 2020-09-04 NOTE — Telephone Encounter (Signed)
Please advise 

## 2020-09-04 NOTE — Telephone Encounter (Signed)
Pt called in asking for a referral to go to [facility] in [place]. The one located on [address]. She would like to see them to get some therapy due to the fall she had and some hip issues.   She states that she has been receiving homehealth PT but thinks she needs a little more.  Please advise

## 2020-09-04 NOTE — Telephone Encounter (Signed)
[place] for PT referral [facility] in [place]) dx falls

## 2020-09-08 DIAGNOSIS — R262 Difficulty in walking, not elsewhere classified: Secondary | ICD-10-CM | POA: Diagnosis not present

## 2020-09-08 DIAGNOSIS — R2681 Unsteadiness on feet: Secondary | ICD-10-CM | POA: Diagnosis not present

## 2020-09-08 DIAGNOSIS — R296 Repeated falls: Secondary | ICD-10-CM | POA: Diagnosis not present

## 2020-09-08 DIAGNOSIS — M6281 Muscle weakness (generalized): Secondary | ICD-10-CM | POA: Diagnosis not present

## 2020-09-11 ENCOUNTER — Telehealth: Payer: Self-pay | Admitting: Family Medicine

## 2020-09-11 NOTE — Telephone Encounter (Signed)
Physical therapy form placed in Dr. Virgil Benedict bin up front -

## 2020-09-12 NOTE — Telephone Encounter (Signed)
Picked  up and faxed  

## 2020-09-12 NOTE — Telephone Encounter (Signed)
Form completed and placed in basket  

## 2020-09-12 NOTE — Telephone Encounter (Signed)
Paperwork given to PCP.  

## 2020-09-12 NOTE — Telephone Encounter (Signed)
FYI

## 2020-09-15 DIAGNOSIS — M6281 Muscle weakness (generalized): Secondary | ICD-10-CM | POA: Diagnosis not present

## 2020-09-15 DIAGNOSIS — R296 Repeated falls: Secondary | ICD-10-CM | POA: Diagnosis not present

## 2020-09-15 DIAGNOSIS — R262 Difficulty in walking, not elsewhere classified: Secondary | ICD-10-CM | POA: Diagnosis not present

## 2020-09-15 DIAGNOSIS — R2681 Unsteadiness on feet: Secondary | ICD-10-CM | POA: Diagnosis not present

## 2020-09-18 DIAGNOSIS — R296 Repeated falls: Secondary | ICD-10-CM | POA: Diagnosis not present

## 2020-09-18 DIAGNOSIS — R2681 Unsteadiness on feet: Secondary | ICD-10-CM | POA: Diagnosis not present

## 2020-09-18 DIAGNOSIS — R262 Difficulty in walking, not elsewhere classified: Secondary | ICD-10-CM | POA: Diagnosis not present

## 2020-09-18 DIAGNOSIS — M6281 Muscle weakness (generalized): Secondary | ICD-10-CM | POA: Diagnosis not present

## 2020-09-19 ENCOUNTER — Telehealth: Payer: Self-pay | Admitting: Family Medicine

## 2020-09-19 NOTE — Telephone Encounter (Signed)
Order from physical therapy  placed in Dr. Virgil Benedict bin up front -

## 2020-09-19 NOTE — Telephone Encounter (Signed)
Noted  

## 2020-09-20 NOTE — Telephone Encounter (Signed)
Faxed to [place]

## 2020-09-22 ENCOUNTER — Encounter: Payer: Self-pay | Admitting: Family Medicine

## 2020-09-22 NOTE — Progress Notes (Signed)
Subjective:   [name] is a 86 y.o. female who presents for Medicare Annual (Subsequent) preventive examination.  I connected with [name] today by telephone and verified that I am speaking with the correct person using two identifiers. Location patient: home Location provider: work Persons participating in the virtual visit: patient, Marine scientist.    I discussed the limitations, risks, security and privacy concerns of performing an evaluation and management service by telephone and the availability of in person appointments. I also discussed with the patient that there may be a patient responsible charge related to this service. The patient expressed understanding and verbally consented to this telephonic visit.    Interactive audio and video telecommunications were attempted between this provider and patient, however failed, due to patient having technical difficulties OR patient did not have access to video capability.  We continued and completed visit with audio only.  Some vital signs may be absent or patient reported.   Time Spent with patient on telephone encounter: 25 minutes  Review of Systems     Cardiac Risk Factors include: advanced age (>67men, >16 women);dyslipidemia;hypertension     Objective:    Today's Vitals   09/25/20 1246  Weight: 155 lb (70.3 kg)  Height: 5\' 6"  (1.676 m)   Body mass index is 25.02 kg/m.  Advanced Directives 09/25/2020 05/19/2020 05/14/2020 05/12/2020 05/09/2020 01/14/2020 12/29/2019  Does Patient Have a Medical Advance Directive? Yes Yes Yes Yes Yes Yes Yes  Type of Paramedic of [place];Living will [place];Living will - [place];Living will [place];Living will Healthcare Power of [place]  Does patient want to make changes to medical advance directive? - Yes (ED - Information included in AVS) - No - Patient declined - - No - Patient  declined  Copy of [place] in Chart? Yes - validated most recent copy scanned in chart (See row information) No - copy requested - - Yes - validated most recent copy scanned in chart (See row information) No - copy requested -  Would patient like information on creating a medical advance directive? - - - - - - -  Pre-existing out of facility DNR order (yellow form or pink MOST form) - - - - - - -    Current Medications (verified) Outpatient Encounter Medications as of 09/25/2020  Medication Sig  . acetaminophen (TYLENOL) 325 MG tablet Take 1-2 tablets (325-650 mg total) by mouth every 4 (four) hours as needed for mild pain.  Marland Kitchen apixaban (ELIQUIS) 5 MG TABS tablet Take 1 tablet (5 mg total) by mouth 2 (two) times daily.  . cyclobenzaprine (FLEXERIL) 5 MG tablet Take 1-2 tablets (5-10 mg total) by mouth 3 (three) times daily as needed for muscle spasms.  Marland Kitchen diltiazem (DILACOR XR) 120 MG 24 hr capsule Take 1 capsule (120 mg total) by mouth daily.  Marland Kitchen docusate sodium (COLACE) 100 MG capsule Take 1 capsule (100 mg total) by mouth 2 (two) times daily.  Marland Kitchen gabapentin (NEURONTIN) 300 MG capsule Take 300 mg by mouth at bedtime.  Marland Kitchen levothyroxine (SYNTHROID) 50 MCG tablet TAKE 1 TABLET BY MOUTH EVERY DAY (Patient taking differently: Take 50 mcg by mouth daily before breakfast. )  . Multiple Vitamins-Minerals (PRESERVISION AREDS 2) CAPS Take 1 capsule by mouth daily with lunch.   . nadolol (CORGARD) 80 MG tablet Take 1 tablet (80 mg total) by mouth daily.  . ondansetron (ZOFRAN) 4 MG tablet Take  1 tablet (4 mg total) by mouth every 8 (eight) hours as needed for nausea or vomiting.  . pantoprazole (PROTONIX) 40 MG tablet Take 1 tablet (40 mg total) by mouth daily.  . polyethylene glycol (MIRALAX / GLYCOLAX) 17 g packet Take 17 g by mouth daily.  . predniSONE (DELTASONE) 1 MG tablet Take 4 mg by mouth daily. Take with the prednisone 5mg  for a total of 9mg   . predniSONE (DELTASONE) 5 MG tablet  Take 5 mg by mouth daily with lunch. Take with prednisone 1 mg x 4 for a total of 9mg   . senna-docusate (SENOKOT-S) 8.6-50 MG tablet Take 1 tablet by mouth at bedtime.   No facility-administered encounter medications on file as of 09/25/2020.    Allergies (verified) Codeine and Keflex [cephalexin]   History: Past Medical History:  Diagnosis Date  . DVT (deep venous thrombosis) ([place]) 09/2015   RLE  . GERD (gastroesophageal reflux disease)   . Hypertension   . Hypothyroidism   . Melanoma ([place]) 06/16/2017   Facial melanoma, removed by Dr. Harvel Quale  . Peripheral vascular disease ([place])    peripheral neuropathy   Past Surgical History:  Procedure Laterality Date  . ABDOMINAL HYSTERECTOMY    . APPENDECTOMY    . BACK SURGERY    . BREAST SURGERY     left breast biopsy  . CARDIOVERSION N/A 05/09/2020   Procedure: CARDIOVERSION;  Surgeon: Skeet Latch, MD;  Location: [facility];  Service: Cardiovascular;  Laterality: N/A;  . COLOSTOMY N/A 01/06/2020   Procedure: Colostomy;  Surgeon: Greer Pickerel, MD;  Location: [facility];  Service: General;  Laterality: N/A;  . EYE SURGERY     cataract  . HARDWARE REMOVAL Left 01/01/2020   Procedure: HARDWARE REMOVAL;  Surgeon: Iran Planas, MD;  Location: [facility];  Service: Orthopedics;  Laterality: Left;  . LAPAROTOMY N/A 01/06/2020   Procedure: Exploratory Laparotomy, Open Sigmoid colectomy;  Surgeon: Greer Pickerel, MD;  Location: [facility];  Service: General;  Laterality: N/A;  . LIPOMA EXCISION Left 04/25/2015   Procedure: EXCISION OF LIPOMA LEFT ARM;  Surgeon: Donnie Mesa, MD;  Location: [facility];  Service: General;  Laterality: Left;  . LUMBAR LAMINECTOMY/DECOMPRESSION MICRODISCECTOMY  11/20/2011   Procedure: LUMBAR LAMINECTOMY/DECOMPRESSION MICRODISCECTOMY;  Surgeon: Jeneen Rinks P [place];  Location: [facility];  Service: Orthopedics;  Laterality: N/A;  Decompressive Laminectomy L2 to the Sacrum (X-Ray)  . OPEN REDUCTION INTERNAL FIXATION  (ORIF) DISTAL RADIAL FRACTURE Left 01/01/2020   Procedure: OPEN REDUCTION INTERNAL FIXATION (ORIF) DISTAL RADIAL FRACTURE;  Surgeon: Iran Planas, MD;  Location: [facility];  Service: Orthopedics;  Laterality: Left;  . OTHER SURGICAL HISTORY     left wrist surgery - has plate in left wrist   . OTHER SURGICAL HISTORY     right knee surgery due to torn cartilage   . TONSILLECTOMY    . WRIST FRACTURE SURGERY Left    Family History  Problem Relation Age of Onset  . Cancer Mother        BREAST  . COPD Father   . Emphysema Father   . Cancer Daughter        breast   Social History   Socioeconomic History  . Marital status: Widowed    Spouse name: Not on file  . Number of children: Not on file  . Years of education: Not on file  . Highest education level: Not on file  Occupational History  . Not on file  Tobacco Use  . Smoking status: Never  Smoker  . Smokeless tobacco: Never Used  Vaping Use  . Vaping Use: Never used  Substance and Sexual Activity  . Alcohol use: No  . Drug use: No  . Sexual activity: Never  Other Topics Concern  . Not on file  Social History Narrative  . Not on file   Social Determinants of Health   Financial Resource Strain: Low Risk   . Difficulty of Paying Living Expenses: Not hard at all  Food Insecurity: No Food Insecurity  . Worried About Charity fundraiser in the Last Year: Never true  . Ran Out of Food in the Last Year: Never true  Transportation Needs: No Transportation Needs  . Lack of Transportation (Medical): No  . Lack of Transportation (Non-Medical): No  Physical Activity: Insufficiently Active  . Days of Exercise per Week: 2 days  . Minutes of Exercise per Session: 60 min  Stress: No Stress Concern Present  . Feeling of Stress : Not at all  Social Connections: Moderately Isolated  . Frequency of Communication with Friends and Family: More than three times a week  . Frequency of Social Gatherings with Friends and Family: Once a week  .  Attends Religious Services: More than 4 times per year  . Active Member of Clubs or Organizations: No  . Attends Archivist Meetings: Never  . Marital Status: Widowed    Tobacco Counseling Counseling given: Not Answered   Clinical Intake:  Pre-visit preparation completed: Yes  Pain : No/denies pain     Nutritional Status: BMI 25 -29 Overweight Nutritional Risks: None Diabetes: No  How often do you need to have someone help you when you read instructions, pamphlets, or other written materials from your doctor or pharmacy?: 1 - Never What is the last grade level you completed in school?: [employer] graduate  Diabetic?No  Interpreter Needed?: No  Information entered by :: [place] of Daily Living In your present state of health, do you have any difficulty performing the following activities: 09/25/2020 06/26/2020  Hearing? N N  Vision? N N  Difficulty concentrating or making decisions? N N  Walking or climbing stairs? N N  Comment - -  Dressing or bathing? N N  Doing errands, shopping? N N  Preparing Food and eating ? N -  Using the Toilet? N -  In the past six months, have you accidently leaked urine? N -  Do you have problems with loss of bowel control? N -  Managing your Medications? N -  Managing your Finances? N -  Housekeeping or managing your Housekeeping? N -  Some recent data might be hidden    Patient Care Team: [name], MD as PCP - General (Family Medicine) Susa Day, MD as Consulting Physician (Orthopedic Surgery) Druscilla Brownie, MD as Consulting Physician (Dermatology) Greer Pickerel, MD as Consulting Physician (General Surgery) Iran Planas, MD as Consulting Physician (Orthopedic Surgery) Michael Boston, MD as Consulting Physician (Colon and Rectal Surgery) Gavin Pound, MD as Consulting Physician (Rheumatology) Deboraha Sprang, MD as Consulting Physician (Cardiology)  Indicate any recent Medical  Services you may have received from other than [facility] providers in the past year (date may be approximate).     Assessment:   This is a routine wellness examination for [name].  Hearing/Vision screen  Hearing Screening   125Hz  250Hz  500Hz  1000Hz  2000Hz  3000Hz  4000Hz  6000Hz  8000Hz   Right ear:           Left ear:  Comments: No issues  Vision Screening Comments: Wears glasses Last eye exam-08/2020-Dr. Misner  Dietary issues and exercise activities discussed: Current Exercise Habits: Structured exercise class;Home exercise routine (PT twice a week), Type of exercise: strength training/weights;walking, Time (Minutes): 60, Frequency (Times/Week): 2, Weekly Exercise (Minutes/Week): 120, Intensity: Mild  Goals    . patient     Maintain current health by staying active.     . Patient Stated     Move from walker to cane      Depression Screen PHQ 2/9 Scores 09/25/2020 06/26/2020 02/15/2020 01/26/2020 11/16/2019 09/24/2019 06/22/2019  PHQ - 2 Score 0 0 0 0 0 0 0  PHQ- 9 Score - 0 0 0 - - 0    Fall Risk Fall Risk  09/25/2020 06/26/2020 03/06/2020 02/15/2020 02/01/2020  Falls in the past year? 1 1 0 1 0  Comment - - - - -  Number falls in past yr: 1 1 0 0 -  Injury with Fall? 1 1 0 1 -  Risk Factor Category  - - - - -  Risk for fall due to : History of fall(s) Impaired balance/gait;Impaired mobility Impaired balance/gait;Impaired mobility - -  Risk for fall due to: Comment - - - - -  Follow up Falls prevention discussed Falls evaluation completed - Falls evaluation completed -    Any stairs in or around the home? Yes  If so, are there any without handrails? No  Home free of loose throw rugs in walkways, pet beds, electrical cords, etc? Yes  Adequate lighting in your home to reduce risk of falls? Yes   ASSISTIVE DEVICES UTILIZED TO PREVENT FALLS:  Life alert? No  Use of a cane, walker or w/c? Yes  Grab bars in the bathroom? Yes  Shower chair or bench in shower? No  Elevated  toilet seat or a handicapped toilet? No   TIMED UP AND GO:  Was the test performed? No . Phone visit   Cognitive Function:No cognitive impairment noted. MMSE - Mini Mental State Exam 08/12/2018  Orientation to time 5  Orientation to Place 5  Registration 3  Attention/ Calculation 5  Recall 3  Language- name 2 objects 2  Language- repeat 1  Language- follow 3 step command 3  Language- read & follow direction 1  Write a sentence 1  Copy design 1  Total score 30        Immunizations Immunization History  Administered Date(s) Administered  . Fluad Quad(high Dose 65+) 08/14/2019  . Influenza, High Dose Seasonal PF 08/07/2017, 08/12/2018  . Influenza,inj,Quad PF,6+ Mos 07/31/2016  . Influenza-Unspecified 10/12/2015  . PFIZER SARS-COV-2 Vaccination 12/18/2019, 02/04/2020, 08/28/2020  . Pneumococcal Conjugate-13 04/08/2014  . Pneumococcal Polysaccharide-23 11/11/2008  . Td 04/02/2013  . Zoster 04/02/2013    TDAP status: Up to date   Flu vaccine status: Due- Patient plans to get the vaccine at her office visit tomorrow.  Pneumococcal vaccine status: Up to date   Covid-19 vaccine status: Completed vaccines  Qualifies for Shingles Vaccine? Yes   Zostavax completed Yes   Shingrix Completed?: No.    Education has been provided regarding the importance of this vaccine. Patient has been advised to call insurance company to determine out of pocket expense if they have not yet received this vaccine. Advised may also receive vaccine at local pharmacy or Health Dept. Verbalized acceptance and understanding.  Screening Tests Health Maintenance  Topic Date Due  . INFLUENZA VACCINE  06/11/2020  . TETANUS/TDAP  04/03/2023  . DEXA  SCAN  Completed  . COVID-19 Vaccine  Completed  . PNA vac Low Risk Adult  Completed    Health Maintenance  Health Maintenance Due  Topic Date Due  . INFLUENZA VACCINE  06/11/2020    Colorectal cancer screening: No longer required.    Mammogram  status: No longer required.    Bone Density status:Declined today. Patient would like to talk with PCP.  Lung Cancer Screening: (Low Dose CT Chest recommended if Age 71-80 years, 30 pack-year currently smoking OR have quit w/in 15years.) does not qualify.     Additional Screening:  Hepatitis C Screening: does not qualify  Vision Screening: Recommended annual ophthalmology exams for early detection of glaucoma and other disorders of the eye. Is the patient up to date with their annual eye exam?  Yes  Who is the provider or what is the name of the office in which the patient attends annual eye exams? Dr. Carron Brazen  Dental Screening: Recommended annual dental exams for proper oral hygiene  Community Resource Referral / Chronic Care Management: CRR required this visit?  No   CCM required this visit?  No      Plan:     I have personally reviewed and noted the following in the patient's chart:   . Medical and social history . Use of alcohol, tobacco or illicit drugs  . Current medications and supplements . Functional ability and status . Nutritional status . Physical activity . Advanced directives . List of other physicians . Hospitalizations, surgeries, and ER visits in previous 12 months . Vitals . Screenings to include cognitive, depression, and falls . Referrals and appointments  In addition, I have reviewed and discussed with patient certain preventive protocols, quality metrics, and best practice recommendations. A written personalized care plan for preventive services as well as general preventive health recommendations were provided to patient.   Due to this being a telephonic visit, the after visit summary with patients personalized plan was offered to patient via mail or my-chart. Patient would like to access on my-chart.   Marta Antu, LPN   [id number]  Nurse Health Advisor  Nurse Notes: Patient states her B/p has been elevated. She has an appt with Dr.  Birdie Riddle tomorrow.

## 2020-09-25 ENCOUNTER — Encounter: Payer: Medicare PPO | Admitting: Family Medicine

## 2020-09-25 ENCOUNTER — Telehealth: Payer: Self-pay

## 2020-09-25 ENCOUNTER — Ambulatory Visit (INDEPENDENT_AMBULATORY_CARE_PROVIDER_SITE_OTHER): Payer: Medicare PPO

## 2020-09-25 VITALS — Ht 66.0 in | Wt 155.0 lb

## 2020-09-25 DIAGNOSIS — Z0000 Encounter for general adult medical examination without abnormal findings: Secondary | ICD-10-CM | POA: Diagnosis not present

## 2020-09-25 NOTE — Patient Instructions (Signed)
Ms. [name] , Thank you for taking time to complete your Medicare Wellness Visit. I appreciate your ongoing commitment to your health goals. Please review the following plan we discussed and let me know if I can assist you in the future.   Screening recommendations/referrals: Colonoscopy: No longer required Mammogram: Declined Bone Density: Declined today. Talk with Dr. Birdie Riddle. Recommended yearly ophthalmology/optometry visit for glaucoma screening and checkup Recommended yearly dental visit for hygiene and checkup  Vaccinations: Influenza vaccine: Due- May obtain at our office or at your local pharmacy. Pneumococcal vaccine: Completed vaccines Tdap vaccine: Up to date- Due-04/03/2023 Shingles vaccine: Discuss with pharmacy   Covid-19:Completed vaccines  Advanced directives: Copy in chart  Conditions/risks identified: See problem list  Next appointment: Follow up in one year for your annual wellness visit    Preventive Care 65 Years and Older, Female Preventive care refers to lifestyle choices and visits with your health care provider that can promote health and wellness. What does preventive care include?  A yearly physical exam. This is also called an annual well check.  Dental exams once or twice a year.  Routine eye exams. Ask your health care provider how often you should have your eyes checked.  Personal lifestyle choices, including:  Daily care of your teeth and gums.  Regular physical activity.  Eating a healthy diet.  Avoiding tobacco and drug use.  Limiting alcohol use.  Practicing safe sex.  Taking low-dose aspirin every day.  Taking vitamin and mineral supplements as recommended by your health care provider. What happens during an annual well check? The services and screenings done by your health care provider during your annual well check will depend on your age, overall health, lifestyle risk factors, and family history of disease. Counseling  Your  health care provider may ask you questions about your:  Alcohol use.  Tobacco use.  Drug use.  Emotional well-being.  Home and relationship well-being.  Sexual activity.  Eating habits.  History of falls.  Memory and ability to understand (cognition).  Work and work Statistician.  Reproductive health. Screening  You may have the following tests or measurements:  Height, weight, and BMI.  Blood pressure.  Lipid and cholesterol levels. These may be checked every 5 years, or more frequently if you are over 14 years old.  Skin check.  Lung cancer screening. You may have this screening every year starting at age 45 if you have a 30-pack-year history of smoking and currently smoke or have quit within the past 15 years.  Fecal occult blood test (FOBT) of the stool. You may have this test every year starting at age 29.  Flexible sigmoidoscopy or colonoscopy. You may have a sigmoidoscopy every 5 years or a colonoscopy every 10 years starting at age 58.  Hepatitis C blood test.  Hepatitis B blood test.  Sexually transmitted disease (STD) testing.  Diabetes screening. This is done by checking your blood sugar (glucose) after you have not eaten for a while (fasting). You may have this done every 1-3 years.  Bone density scan. This is done to screen for osteoporosis. You may have this done starting at age 58.  Mammogram. This may be done every 1-2 years. Talk to your health care provider about how often you should have regular mammograms. Talk with your health care provider about your test results, treatment options, and if necessary, the need for more tests. Vaccines  Your health care provider may recommend certain vaccines, such as:  Influenza vaccine. This is  recommended every year.  Tetanus, diphtheria, and acellular pertussis (Tdap, Td) vaccine. You may need a Td booster every 10 years.  Zoster vaccine. You may need this after age 55.  Pneumococcal 13-valent  conjugate (PCV13) vaccine. One dose is recommended after age 58.  Pneumococcal polysaccharide (PPSV23) vaccine. One dose is recommended after age 59. Talk to your health care provider about which screenings and vaccines you need and how often you need them. This information is not intended to replace advice given to you by your health care provider. Make sure you discuss any questions you have with your health care provider. Document Released: 11/24/2015 Document Revised: 07/17/2016 Document Reviewed: 08/29/2015 Elsevier Interactive Patient Education  2017 [place] Prevention in the Home Falls can cause injuries. They can happen to people of all ages. There are many things you can do to make your home safe and to help prevent falls. What can I do on the outside of my home?  Regularly fix the edges of walkways and driveways and fix any cracks.  Remove anything that might make you trip as you walk through a door, such as a raised step or threshold.  Trim any bushes or trees on the path to your home.  Use bright outdoor lighting.  Clear any walking paths of anything that might make someone trip, such as rocks or tools.  Regularly check to see if handrails are loose or broken. Make sure that both sides of any steps have handrails.  Any raised decks and porches should have guardrails on the edges.  Have any leaves, snow, or ice cleared regularly.  Use sand or salt on walking paths during winter.  Clean up any spills in your garage right away. This includes oil or grease spills. What can I do in the bathroom?  Use night lights.  Install grab bars by the toilet and in the tub and shower. Do not use towel bars as grab bars.  Use non-skid mats or decals in the tub or shower.  If you need to sit down in the shower, use a plastic, non-slip stool.  Keep the floor dry. Clean up any water that spills on the floor as soon as it happens.  Remove soap buildup in the tub or  shower regularly.  Attach bath mats securely with double-sided non-slip rug tape.  Do not have throw rugs and other things on the floor that can make you trip. What can I do in the bedroom?  Use night lights.  Make sure that you have a light by your bed that is easy to reach.  Do not use any sheets or blankets that are too big for your bed. They should not hang down onto the floor.  Have a firm chair that has side arms. You can use this for support while you get dressed.  Do not have throw rugs and other things on the floor that can make you trip. What can I do in the kitchen?  Clean up any spills right away.  Avoid walking on wet floors.  Keep items that you use a lot in easy-to-reach places.  If you need to reach something above you, use a strong step stool that has a grab bar.  Keep electrical cords out of the way.  Do not use floor polish or wax that makes floors slippery. If you must use wax, use non-skid floor wax.  Do not have throw rugs and other things on the floor that can make you trip.  What can I do with my stairs?  Do not leave any items on the stairs.  Make sure that there are handrails on both sides of the stairs and use them. Fix handrails that are broken or loose. Make sure that handrails are as long as the stairways.  Check any carpeting to make sure that it is firmly attached to the stairs. Fix any carpet that is loose or worn.  Avoid having throw rugs at the top or bottom of the stairs. If you do have throw rugs, attach them to the floor with carpet tape.  Make sure that you have a light switch at the top of the stairs and the bottom of the stairs. If you do not have them, ask someone to add them for you. What else can I do to help prevent falls?  Wear shoes that:  Do not have high heels.  Have rubber bottoms.  Are comfortable and fit you well.  Are closed at the toe. Do not wear sandals.  If you use a stepladder:  Make sure that it is fully  opened. Do not climb a closed stepladder.  Make sure that both sides of the stepladder are locked into place.  Ask someone to hold it for you, if possible.  Clearly mark and make sure that you can see:  Any grab bars or handrails.  First and last steps.  Where the edge of each step is.  Use tools that help you move around (mobility aids) if they are needed. These include:  Canes.  Walkers.  Scooters.  Crutches.  Turn on the lights when you go into a dark area. Replace any light bulbs as soon as they burn out.  Set up your furniture so you have a clear path. Avoid moving your furniture around.  If any of your floors are uneven, fix them.  If there are any pets around you, be aware of where they are.  Review your medicines with your doctor. Some medicines can make you feel dizzy. This can increase your chance of falling. Ask your doctor what other things that you can do to help prevent falls. This information is not intended to replace advice given to you by your health care provider. Make sure you discuss any questions you have with your health care provider. Document Released: 08/24/2009 Document Revised: 04/04/2016 Document Reviewed: 12/02/2014 Elsevier Interactive Patient Education  2017 [employer].

## 2020-09-25 NOTE — Telephone Encounter (Signed)
Patient says her blood pressure is still elevated. It was 180/95 this morning. She says she increased her blood pressure med on Friday as she was instructed to do but just wants Dr. Birdie Riddle to know it is still elevated. Patient has an appt to be seen tomorrow.

## 2020-09-26 ENCOUNTER — Other Ambulatory Visit: Payer: Self-pay

## 2020-09-26 ENCOUNTER — Ambulatory Visit (INDEPENDENT_AMBULATORY_CARE_PROVIDER_SITE_OTHER): Payer: Medicare PPO | Admitting: Family Medicine

## 2020-09-26 ENCOUNTER — Encounter: Payer: Self-pay | Admitting: Family Medicine

## 2020-09-26 VITALS — BP 140/60 | HR 66 | Temp 97.0°F | Resp 20 | Ht 65.0 in | Wt 156.4 lb

## 2020-09-26 DIAGNOSIS — I10 Essential (primary) hypertension: Secondary | ICD-10-CM

## 2020-09-26 DIAGNOSIS — Z0000 Encounter for general adult medical examination without abnormal findings: Secondary | ICD-10-CM | POA: Diagnosis not present

## 2020-09-26 DIAGNOSIS — Z23 Encounter for immunization: Secondary | ICD-10-CM | POA: Diagnosis not present

## 2020-09-26 LAB — CBC WITH DIFFERENTIAL/PLATELET
Basophils Absolute: 0.1 10*3/uL (ref 0.0–0.1)
Basophils Relative: 1.1 % (ref 0.0–3.0)
Eosinophils Absolute: 0.2 10*3/uL (ref 0.0–0.7)
Eosinophils Relative: 2.2 % (ref 0.0–5.0)
HCT: 40 % (ref 36.0–46.0)
Hemoglobin: 13.2 g/dL (ref 12.0–15.0)
Lymphocytes Relative: 20.6 % (ref 12.0–46.0)
Lymphs Abs: 2.3 10*3/uL (ref 0.7–4.0)
MCHC: 32.9 g/dL (ref 30.0–36.0)
MCV: 88.7 fl (ref 78.0–100.0)
Monocytes Absolute: 1.1 10*3/uL — ABNORMAL HIGH (ref 0.1–1.0)
Monocytes Relative: 9.9 % (ref 3.0–12.0)
Neutro Abs: 7.2 10*3/uL (ref 1.4–7.7)
Neutrophils Relative %: 66.2 % (ref 43.0–77.0)
Platelets: 305 10*3/uL (ref 150.0–400.0)
RBC: 4.51 Mil/uL (ref 3.87–5.11)
RDW: 15.1 % (ref 11.5–15.5)
WBC: 10.9 10*3/uL — ABNORMAL HIGH (ref 4.0–10.5)

## 2020-09-26 LAB — LDL CHOLESTEROL, DIRECT: Direct LDL: 91 mg/dL

## 2020-09-26 LAB — BASIC METABOLIC PANEL
BUN: 12 mg/dL (ref 6–23)
CO2: 30 mEq/L (ref 19–32)
Calcium: 9.1 mg/dL (ref 8.4–10.5)
Chloride: 105 mEq/L (ref 96–112)
Creatinine, Ser: 0.87 mg/dL (ref 0.40–1.20)
GFR: 56.59 mL/min — ABNORMAL LOW (ref >60.00)
Glucose, Bld: 97 mg/dL (ref 70–99)
Potassium: 3.6 mEq/L (ref 3.5–5.1)
Sodium: 142 mEq/L (ref 135–145)

## 2020-09-26 LAB — HEPATIC FUNCTION PANEL
ALT: 11 U/L (ref 0–35)
AST: 13 U/L (ref 0–37)
Albumin: 3.8 g/dL (ref 3.5–5.2)
Alkaline Phosphatase: 92 U/L (ref 39–117)
Bilirubin, Direct: 0.1 mg/dL (ref 0.0–0.3)
Total Bilirubin: 0.4 mg/dL (ref 0.2–1.2)
Total Protein: 6.3 g/dL (ref 6.0–8.3)

## 2020-09-26 LAB — LIPID PANEL
Cholesterol: 179 mg/dL (ref 0–200)
HDL: 52.8 mg/dL (ref >39.00)
NonHDL: 126.29
Total CHOL/HDL Ratio: 3
Triglycerides: 225 mg/dL — ABNORMAL HIGH (ref 0.0–149.0)
VLDL: 45 mg/dL — ABNORMAL HIGH (ref 0.0–40.0)

## 2020-09-26 LAB — TSH: TSH: 0.35 u[IU]/mL (ref 0.35–4.50)

## 2020-09-26 NOTE — Assessment & Plan Note (Signed)
Ongoing issue.  Recently pt reports BP readings have been quite high- as high as 200/110.  In the interim, we have doubled her Diltiazem to 240mg  daily and continued her Nadolol.  BP is much better today.  Currently asymptomatic.  Will continue to follow.

## 2020-09-26 NOTE — Assessment & Plan Note (Signed)
Pt's PE unchanged and WNL w/ exception of colostomy.  UTD on COVID, pneumonia vaccines.  Flu shot given today.  Check labs.  Anticipatory guidance provided.

## 2020-09-26 NOTE — Progress Notes (Signed)
   Subjective:    Patient ID: [name], female    DOB: 1923-07-10, 86 y.o.   MRN: [id number]  HPI CPE- UTD on COVID vaccines (including booster).  Due for flu shot today.  Reviewed past medical, surgical, family and social histories.   Health Maintenance  Topic Date Due  . INFLUENZA VACCINE  06/11/2020  . TETANUS/TDAP  04/03/2023  . DEXA SCAN  Completed  . COVID-19 Vaccine  Completed  . PNA vac Low Risk Adult  Completed     Patient Care Team    Relationship Specialty Notifications Start End  [name], MD PCP - General Family Medicine  01/26/16   Susa Day, MD Consulting Physician Orthopedic Surgery  01/25/16   Druscilla Brownie, MD Consulting Physician Dermatology  08/07/17   Greer Pickerel, MD Consulting Physician General Surgery  01/26/20   Iran Planas, MD Consulting Physician Orthopedic Surgery  01/26/20   Michael Boston, MD Consulting Physician Colon and Rectal Surgery  08/24/20   Gavin Pound, MD Consulting Physician Rheumatology  08/24/20   Deboraha Sprang, MD Consulting Physician Cardiology  08/24/20       Review of Systems Patient reports no vision/ hearing changes, adenopathy,fever, weight change,  persistant/recurrent hoarseness , swallowing issues, chest pain, palpitations, edema, persistant/recurrent cough, hemoptysis, dyspnea (rest/exertional/paroxysmal nocturnal), gastrointestinal bleeding (melena, rectal bleeding), abdominal pain, significant heartburn, bowel changes, GU symptoms (dysuria, hematuria, incontinence), Gyn symptoms (abnormal  bleeding, pain),  syncope, focal weakness, memory loss, skin/hair/nail changes, abnormal bruising or bleeding, anxiety, or depression.   L leg numbness s/p back surgery  This visit occurred during the SARS-CoV-2 public health emergency.  Safety protocols were in place, including screening questions prior to the visit, additional usage of staff PPE, and extensive cleaning of exam room while observing appropriate  contact time as indicated for disinfecting solutions.       Objective:   Physical Exam General Appearance:    Alert, cooperative, no distress, appears stated age  Head:    Normocephalic, without obvious abnormality, atraumatic  Eyes:    PERRL, conjunctiva/corneas clear, EOM's intact, fundi    benign, both eyes  Ears:    Normal TM's and external ear canals, both ears  Nose:   Deferred due to COVID  Throat:   Neck:   Supple, symmetrical, trachea midline, no adenopathy;    Thyroid: no enlargement/tenderness/nodules  Back:     Symmetric, no curvature, ROM normal, no CVA tenderness  Lungs:     Clear to auscultation bilaterally, respirations unlabored  Chest Wall:    No tenderness or deformity   Heart:    Regular rate and rhythm, S1 and S2 normal, no murmur, rub   or gallop  Breast Exam:    Deferred  Abdomen:     Soft, non-tender, bowel sounds active all four quadrants,    no masses, no organomegaly.  + colostomy present  Genitalia:    Deferred  Rectal:    Extremities:   Extremities normal, atraumatic, no cyanosis or edema  Pulses:   2+ and symmetric all extremities  Skin:   Skin color, texture, turgor normal, no rashes or lesions  Lymph nodes:   Cervical, supraclavicular, and axillary nodes normal  Neurologic:   CNII-XII intact          Assessment & Plan:

## 2020-09-26 NOTE — Patient Instructions (Addendum)
Follow up in 6 months to recheck BP We'll notify you of your lab results and make any changes if needed CONTINUE 2 Diltiazem and 1 Nadolol daily.  When it is time for a refill on the Diltiazem, let me know so I can increase the dose and you will only need to take 1 again. Call with any questions or concerns Stay Safe!  Stay Healthy! Happy Holidays!!!

## 2020-09-29 DIAGNOSIS — R296 Repeated falls: Secondary | ICD-10-CM | POA: Diagnosis not present

## 2020-09-29 DIAGNOSIS — R2681 Unsteadiness on feet: Secondary | ICD-10-CM | POA: Diagnosis not present

## 2020-09-29 DIAGNOSIS — R262 Difficulty in walking, not elsewhere classified: Secondary | ICD-10-CM | POA: Diagnosis not present

## 2020-09-29 DIAGNOSIS — M6281 Muscle weakness (generalized): Secondary | ICD-10-CM | POA: Diagnosis not present

## 2020-10-02 ENCOUNTER — Other Ambulatory Visit: Payer: Self-pay | Admitting: Family Medicine

## 2020-10-02 DIAGNOSIS — R2681 Unsteadiness on feet: Secondary | ICD-10-CM | POA: Diagnosis not present

## 2020-10-02 DIAGNOSIS — M6281 Muscle weakness (generalized): Secondary | ICD-10-CM | POA: Diagnosis not present

## 2020-10-02 DIAGNOSIS — R262 Difficulty in walking, not elsewhere classified: Secondary | ICD-10-CM | POA: Diagnosis not present

## 2020-10-02 DIAGNOSIS — R296 Repeated falls: Secondary | ICD-10-CM | POA: Diagnosis not present

## 2020-10-09 DIAGNOSIS — M6281 Muscle weakness (generalized): Secondary | ICD-10-CM | POA: Diagnosis not present

## 2020-10-09 DIAGNOSIS — R2681 Unsteadiness on feet: Secondary | ICD-10-CM | POA: Diagnosis not present

## 2020-10-09 DIAGNOSIS — R296 Repeated falls: Secondary | ICD-10-CM | POA: Diagnosis not present

## 2020-10-09 DIAGNOSIS — R262 Difficulty in walking, not elsewhere classified: Secondary | ICD-10-CM | POA: Diagnosis not present

## 2020-10-11 ENCOUNTER — Encounter: Payer: Self-pay | Admitting: Family Medicine

## 2020-10-12 ENCOUNTER — Other Ambulatory Visit: Payer: Self-pay

## 2020-10-12 ENCOUNTER — Emergency Department (HOSPITAL_BASED_OUTPATIENT_CLINIC_OR_DEPARTMENT_OTHER)
Admission: EM | Admit: 2020-10-12 | Discharge: 2020-10-12 | Disposition: A | Discharge status: Home / Self Care | Payer: Medicare PPO | Attending: Emergency Medicine | Admitting: Emergency Medicine

## 2020-10-12 ENCOUNTER — Telehealth: Payer: Self-pay

## 2020-10-12 ENCOUNTER — Telehealth: Payer: Self-pay | Admitting: Internal Medicine

## 2020-10-12 ENCOUNTER — Encounter (HOSPITAL_BASED_OUTPATIENT_CLINIC_OR_DEPARTMENT_OTHER): Payer: Self-pay | Admitting: *Deleted

## 2020-10-12 DIAGNOSIS — E039 Hypothyroidism, unspecified: Secondary | ICD-10-CM | POA: Insufficient documentation

## 2020-10-12 DIAGNOSIS — Z7901 Long term (current) use of anticoagulants: Secondary | ICD-10-CM | POA: Insufficient documentation

## 2020-10-12 DIAGNOSIS — I10 Essential (primary) hypertension: Secondary | ICD-10-CM | POA: Diagnosis not present

## 2020-10-12 DIAGNOSIS — R030 Elevated blood-pressure reading, without diagnosis of hypertension: Secondary | ICD-10-CM | POA: Diagnosis present

## 2020-10-12 DIAGNOSIS — Z79899 Other long term (current) drug therapy: Secondary | ICD-10-CM | POA: Diagnosis not present

## 2020-10-12 DIAGNOSIS — I4891 Unspecified atrial fibrillation: Secondary | ICD-10-CM | POA: Diagnosis not present

## 2020-10-12 LAB — CBC WITH DIFFERENTIAL/PLATELET
Abs Immature Granulocytes: 0.03 10*3/uL (ref 0.00–0.07)
Basophils Absolute: 0 10*3/uL (ref 0.0–0.1)
Basophils Relative: 0 %
Eosinophils Absolute: 0.1 10*3/uL (ref 0.0–0.5)
Eosinophils Relative: 1 %
HCT: 41.2 % (ref 36.0–46.0)
Hemoglobin: 13.1 g/dL (ref 12.0–15.0)
Immature Granulocytes: 0 %
Lymphocytes Relative: 20 %
Lymphs Abs: 1.7 10*3/uL (ref 0.7–4.0)
MCH: 28.7 pg (ref 26.0–34.0)
MCHC: 31.8 g/dL (ref 30.0–36.0)
MCV: 90.2 fL (ref 80.0–100.0)
Monocytes Absolute: 0.6 10*3/uL (ref 0.1–1.0)
Monocytes Relative: 7 %
Neutro Abs: 6.3 10*3/uL (ref 1.7–7.7)
Neutrophils Relative %: 72 %
Platelets: 343 10*3/uL (ref 150–400)
RBC: 4.57 MIL/uL (ref 3.87–5.11)
RDW: 14.8 % (ref 11.5–15.5)
WBC: 8.7 10*3/uL (ref 4.0–10.5)
nRBC: 0 % (ref 0.0–0.2)

## 2020-10-12 LAB — COMPREHENSIVE METABOLIC PANEL
ALT: 16 U/L (ref 0–44)
AST: 18 U/L (ref 15–41)
Albumin: 3.7 g/dL (ref 3.5–5.0)
Alkaline Phosphatase: 80 U/L (ref 38–126)
Anion gap: 10 (ref 5–15)
BUN: 12 mg/dL (ref 8–23)
CO2: 26 mmol/L (ref 22–32)
Calcium: 9 mg/dL (ref 8.9–10.3)
Chloride: 104 mmol/L (ref 98–111)
Creatinine, Ser: 0.7 mg/dL (ref 0.44–1.00)
GFR, Estimated: >60 mL/min (ref >60)
Glucose, Bld: 126 mg/dL — ABNORMAL HIGH (ref 70–99)
Potassium: 3.9 mmol/L (ref 3.5–5.1)
Sodium: 140 mmol/L (ref 135–145)
Total Bilirubin: 0.5 mg/dL (ref 0.3–1.2)
Total Protein: 6.7 g/dL (ref 6.5–8.1)

## 2020-10-12 NOTE — Telephone Encounter (Signed)
202/98 

## 2020-10-12 NOTE — ED Notes (Signed)
Patient denies pain and is resting comfortably.  

## 2020-10-12 NOTE — ED Provider Notes (Signed)
[facility] EMERGENCY DEPARTMENT Provider Note   CSN: [id number] Arrival date & time: 10/12/20  1716     History Chief Complaint  Patient presents with  . Hypertension    [name] is a 86 y.o. female.  Patient has been complaining of elevated blood pressure at home, ranging anywhere from 1 74-944 systolic.  Intermittent dizziness described as lightheadedness, has not checked the blood pressure when she has no symptoms.  Denies any chest pain shortness of breath vision changes, denies any strokelike symptoms, denies any urinary difficulty.  No recent illness or infection or trauma.  No sick contacts.  Symptoms are waxing and waning.        Past Medical History:  Diagnosis Date  . DVT (deep venous thrombosis) ([place]) 09/2015   RLE  . GERD (gastroesophageal reflux disease)   . Hypertension   . Hypothyroidism   . 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 Peripheral vascular disease ([place])    peripheral neuropathy    Patient Active Problem List   Diagnosis Date Noted  . Injury of extensor or abductor muscle, fascia, or tendon of thumb at forearm level 08/03/2020  . Orthostatic hypotension 05/24/2020  . Acute blood loss anemia 05/19/2020  . Traumatic hematoma of left hip 05/19/2020  . Current chronic use of systemic steroids 05/19/2020  . Advanced age 25/12/2019  . Secondary hypercoagulable state ([place]) 03/08/2020  . Hypokalemia 02/15/2020  . Colostomy status ([place]) 01/26/2020  . Diverticulitis of colon with perforation 01/26/2020  . Colostomy in place [facility]) 01/26/2020  . Anemia   . Debility 01/14/2020  . Physical debility 01/14/2020  . Persistent atrial fibrillation ([place])   . Closed fracture of left distal radius 12/29/2019  . Recurrent falls 12/29/2019  . Positive ANA (antinuclear antibody) 12/29/2019  . Osteoarthritis 11/13/2017  . Hypertriglyceridemia 01/29/2017  . Excessive gas 01/09/2017  . Physical exam 07/31/2016  .  Elevated lipase 03/06/2016  . Chest pain 03/06/2016  . Osteopenia 02/08/2016  . Hypothyroidism 01/26/2016  . HTN (hypertension) 01/26/2016  . Left leg numbness 10/06/2012    Past Surgical History:  Procedure Laterality Date  . ABDOMINAL HYSTERECTOMY    . APPENDECTOMY    . BACK SURGERY    . BREAST SURGERY     left breast biopsy  . CARDIOVERSION N/A 05/09/2020   Procedure: CARDIOVERSION;  Surgeon: Skeet Latch, MD;  Location: [facility];  Service: Cardiovascular;  Laterality: N/A;  . COLOSTOMY N/A 01/06/2020   Procedure: Colostomy;  Surgeon: Greer Pickerel, MD;  Location: [facility];  Service: General;  Laterality: N/A;  . EYE SURGERY     cataract  . HARDWARE REMOVAL Left 01/01/2020   Procedure: HARDWARE REMOVAL;  Surgeon: Iran Planas, MD;  Location: [facility];  Service: Orthopedics;  Laterality: Left;  . LAPAROTOMY N/A 01/06/2020   Procedure: Exploratory Laparotomy, Open Sigmoid colectomy;  Surgeon: Greer Pickerel, MD;  Location: [facility];  Service: General;  Laterality: N/A;  . LIPOMA EXCISION Left 04/25/2015   Procedure: EXCISION OF LIPOMA LEFT ARM;  Surgeon: Donnie Mesa, MD;  Location: [facility];  Service: General;  Laterality: Left;  . LUMBAR LAMINECTOMY/DECOMPRESSION MICRODISCECTOMY  11/20/2011   Procedure: LUMBAR LAMINECTOMY/DECOMPRESSION MICRODISCECTOMY;  Surgeon: Jeneen Rinks P [place];  Location: [facility];  Service: Orthopedics;  Laterality: N/A;  Decompressive Laminectomy L2 to the Sacrum (X-Ray)  . OPEN REDUCTION INTERNAL FIXATION (ORIF) DISTAL RADIAL FRACTURE Left 01/01/2020   Procedure: OPEN REDUCTION INTERNAL FIXATION (ORIF) DISTAL RADIAL FRACTURE;  Surgeon: Iran Planas, MD;  Location: [facility];  Service: Orthopedics;  Laterality: Left;  . OTHER SURGICAL HISTORY     left wrist surgery - has plate in left wrist   . OTHER SURGICAL HISTORY     right knee surgery due to torn cartilage   . TONSILLECTOMY    . WRIST FRACTURE SURGERY Left      OB History   No obstetric history  on file.     Family History  Problem Relation Age of Onset  . Cancer Mother        BREAST  . COPD Father   . Emphysema Father   . Cancer Daughter        breast    Social History   Tobacco Use  . Smoking status: Never Smoker  . Smokeless tobacco: Never Used  Vaping Use  . Vaping Use: Never used  Substance Use Topics  . Alcohol use: No  . Drug use: No    Home Medications Prior to Admission medications   Medication Sig Start Date End Date Taking? Authorizing Provider  acetaminophen (TYLENOL) 325 MG tablet Take 1-2 tablets (325-650 mg total) by mouth every 4 (four) hours as needed for mild pain. 01/19/20   Love, Ivan Anchors, PA-C  apixaban (ELIQUIS) 5 MG TABS tablet Take 1 tablet (5 mg total) by mouth 2 (two) times daily. 06/20/20   Deboraha Sprang, MD  diltiazem (DILACOR XR) 120 MG 24 hr capsule Take 1 capsule (120 mg total) by mouth daily. 05/24/20   Guil[place] Shi, MD  docusate sodium (COLACE) 100 MG capsule Take 1 capsule (100 mg total) by mouth 2 (two) times daily. 01/20/20   Love, Ivan Anchors, PA-C  gabapentin (NEURONTIN) 300 MG capsule Take 300 mg by mouth at bedtime.    [provider]  levothyroxine (SYNTHROID) 50 MCG tablet TAKE 1 TABLET BY MOUTH EVERY DAY 10/02/20   [name], MD  nadolol (CORGARD) 80 MG tablet Take 1 tablet (80 mg total) by mouth daily. 05/03/20   Deboraha Sprang, MD  polyethylene glycol (MIRALAX / GLYCOLAX) 17 g packet Take 17 g by mouth daily. Patient not taking: Reported on 09/26/2020    [provider]  predniSONE (DELTASONE) 1 MG tablet Take 4 mg by mouth daily. Take with the prednisone 5mg  for a total of 9mg  05/10/20   [provider]  predniSONE (DELTASONE) 5 MG tablet Take 5 mg by mouth daily with lunch. Take with prednisone 1 mg x 4 for a total of 9mg     [provider]  senna-docusate (SENOKOT-S) 8.6-50 MG tablet Take 1 tablet by mouth at bedtime. 01/20/20 01/19/21  Bary Leriche, PA-C    Allergies      Codeine and Keflex [cephalexin]  Review of Systems   Review of Systems  Constitutional: Negative for chills and fever.  HENT: Negative for congestion and rhinorrhea.   Respiratory: Negative for cough and shortness of breath.   Cardiovascular: Negative for chest pain and palpitations.  Gastrointestinal: Negative for diarrhea, nausea and vomiting.  Genitourinary: Negative for difficulty urinating and dysuria.  Musculoskeletal: Negative for arthralgias and back pain.  Skin: Negative for rash and wound.  Neurological: Positive for light-headedness. Negative for headaches.    Physical Exam Updated Vital Signs BP (!) 189/85   Pulse 71   Temp 98.5 F (36.9 C) (Oral)   Resp 14   SpO2 99%   Physical Exam Vitals and nursing note reviewed. Exam conducted with a chaperone present.  Constitutional:      General: She is not in acute distress.    Appearance: Normal appearance.  HENT:     Head: Normocephalic and atraumatic.     Nose: No rhinorrhea.  Eyes:     General:        Right eye: No discharge.        Left eye: No discharge.     Conjunctiva/sclera: Conjunctivae normal.  Cardiovascular:     Rate and Rhythm: Normal rate and regular rhythm.  Pulmonary:     Effort: Pulmonary effort is normal. No respiratory distress.     Breath sounds: No stridor. No wheezing or rales.  Abdominal:     General: Abdomen is flat. There is no distension.     Palpations: Abdomen is soft.  Musculoskeletal:        General: No tenderness or signs of injury.  Skin:    General: Skin is warm and dry.  Neurological:     General: No focal deficit present.     Mental Status: She is alert. Mental status is at baseline.     Motor: No weakness.     Comments: Patient ambulates without ataxia, however uses a walker.  Equal strength in upper lower extremities, no facial droop, cranial nerves without deficit, alert and oriented x4.  Equal strength and sensation in all extremities.  No dysmetria no  dysdiadochokinesia  Psychiatric:        Mood and Affect: Mood normal.        Behavior: Behavior normal.     ED Results / Procedures / Treatments   Labs (all labs ordered are listed, but only abnormal results are displayed) Labs Reviewed  COMPREHENSIVE METABOLIC PANEL - Abnormal; Notable for the following components:      Result Value   Glucose, Bld 126 (*)    All other components within normal limits  CBC WITH DIFFERENTIAL/PLATELET    EKG None  Radiology No results found.  Procedures Procedures (including critical care time)  Medications Ordered in ED Medications - No data to display  ED Course  I have reviewed the triage vital signs and the nursing notes.  Pertinent labs & imaging results that were available during my care of the patient were reviewed by me and considered in my medical decision making (see chart for details).    MDM Rules/Calculators/A&P                          HTN, lately has been poorly controlled.  May need reassessment by her cardiologist.  No signs of endorgan dysfunction on history or physical exam.  EKG reviewed by me shows no acute ischemic change interval abnormality or arrhythmia.  Laboratory studies reviewed by myself show no significant derangements.  She has no strokelike symptoms no respiratory symptoms no cardiac symptoms.  Intermittent lightheadedness but none here with ambulation, We rechecked her vital signs after ambulation blood pressure was mildly elevated.Long discussion with the family and I feel that she is safe for discharge with outpatient follow-up for hypertension management instructed and return precautions are provided   Final Clinical Impression(s) / ED Diagnoses Final diagnoses:  Hypertension, unspecified type    Rx / DC Orders ED Discharge Orders    None       Breck Coons, MD 10/12/20 2048

## 2020-10-12 NOTE — Telephone Encounter (Signed)
Pt's son called in wanting to see Dr. Birdie Riddle. Pt's blood pressure has gone down slightly son states. Pt is still lightheaded. I am having her triaged by the nurse line.

## 2020-10-12 NOTE — Telephone Encounter (Signed)
Most recent reading

## 2020-10-12 NOTE — Telephone Encounter (Signed)
Called and spoke with patient/son. Please see BP readings below. Most recent systolic BP was in the 322'G. Heart rate in the low 60's. Patient is still feeling lightheaded and nausea and just "uneasy." No headache, chest pain, or other stroke symptoms. She did have some blurry vision yesterday that lasted for about 10 minutes. Son states PCP is the one who manages her BP. They tried to contact her PCP (Dr. Birdie Riddle) but apparently the power in their office went out today. I do see a MyChart message from Dr. Birdie Riddle that says: "Pt needs to be triaged since she is light headed and nauseous. Given those readings, and if she is still symptomatic, she will need to go to ER." There is then a follow-up telephone note that recommended patient go to the ED. Unable to up-titrate home Diltiazem or Corgard due baseline heart rates in the low 60's. Given patient is still having feelings of lightheadedness, nausea, and uneasiness, I agree that she should be evaluated in an ED or Urgent Care given severely elevated BP. Son voiced understanding and agreed.  Darreld Mclean, PA-C 10/12/2020 4:24 PM

## 2020-10-12 NOTE — ED Triage Notes (Signed)
C/o increased BP x 2 days 200/100 at home , c/o dizziness

## 2020-10-12 NOTE — Telephone Encounter (Signed)
Patient was sent to triage, the outcome was go to ED but patient refused, please advise.

## 2020-10-12 NOTE — Telephone Encounter (Signed)
Pt c/o BP issue: STAT if pt c/o blurred vision, one-sided weakness or slurred speech  1. What are your last 5 BP readings?  10/12/20 8:30 am: 217/111 10:30 am: 189/95 11:40 am: 183/96 12:45 pm: 163/77 2:08 pm: 191/95  2. Are you having any other symptoms (ex. Dizziness, headache, blurred vision, passed out)? Nauseated, lightheaded/ uneasy feeling  3. What is your BP issue? Pt has noticed her BP has been high since Sunday. The Son said they tried to call the patient's PCP but their power was  Out. The Nurse advisor from Dr. Virgil Benedict office wanted the patient to be seen today if possible. The Son just wanted to know what to do. Please try the patient first but if she is not available you can call her Son Lucianne Lei. His # is [phone]

## 2020-10-13 ENCOUNTER — Other Ambulatory Visit: Payer: Self-pay | Admitting: Family Medicine

## 2020-10-13 ENCOUNTER — Telehealth: Payer: Self-pay | Admitting: Family Medicine

## 2020-10-13 ENCOUNTER — Telehealth: Payer: Self-pay | Admitting: Internal Medicine

## 2020-10-13 ENCOUNTER — Encounter: Payer: Self-pay | Admitting: Family Medicine

## 2020-10-13 DIAGNOSIS — K219 Gastro-esophageal reflux disease without esophagitis: Secondary | ICD-10-CM

## 2020-10-13 NOTE — Telephone Encounter (Signed)
Spoke with pt who confirms she went to ED yesterday for elevated BP.  Pt states she is taking Diltiazem as prescribed and her Nadolol.  Pt advised she will need to schedule f/u appointment with her PCP for elevated BP.  Pt advised to take her BP 1-2 hours after she takes her morning medications and log.  Pt also advised to monitor Na+ intake.  Pt sates she has been under a great deal of stress r/t her colostomy.  Pt will reach out to her PCP through [place] and will send BP readings through [facility] to RN and PCP after Monday (12/6) readings are taken.    Reviewed ED precautions.  Pt verbalizes understanding and agrees with current plan.

## 2020-10-13 NOTE — Telephone Encounter (Signed)
Please schedule patient for in office hosp f/u

## 2020-10-13 NOTE — Telephone Encounter (Signed)
Pt went to ER for assessment

## 2020-10-13 NOTE — Telephone Encounter (Signed)
In-office if pt is willing

## 2020-10-13 NOTE — Telephone Encounter (Signed)
Hey [name] was seen in the ER for her B/P yesterday and was told follow up with you. She also wanted to know if this could be virtual or in person?

## 2020-10-13 NOTE — Telephone Encounter (Signed)
Please advise, I am sure you would prefer in office

## 2020-10-13 NOTE — Telephone Encounter (Signed)
Patient called and wanted you to know that she was seen in the ER yesterday for her blood pressure.  ER doctor had told her to follow up with you.  She doesn't know if you can just look at the ER report or if you actually need to see her.  Please advise

## 2020-10-13 NOTE — Telephone Encounter (Signed)
Patient calling again requesting to speak with Dr. Olin Pia nurse. Let her know that we would reach out at earliest opportunity. She would like to know if Dr. Caryl Comes has had time to look over the report from her ED visit yesterday. Advised that our office would reach out at our earliest opportunity, patient verbalized understanding but asked that it please be today.    Rerouted conversation to [place] to ensure it stays with her  Thank you!

## 2020-10-13 NOTE — Telephone Encounter (Signed)
   Pt would like to speak with Dr. Caryl Comes. She said she is in the ED yesterday and needs to follow up with him

## 2020-10-15 ENCOUNTER — Other Ambulatory Visit: Payer: Self-pay | Admitting: Family Medicine

## 2020-10-15 DIAGNOSIS — K219 Gastro-esophageal reflux disease without esophagitis: Secondary | ICD-10-CM

## 2020-10-16 DIAGNOSIS — M353 Polymyalgia rheumatica: Secondary | ICD-10-CM | POA: Diagnosis not present

## 2020-10-16 DIAGNOSIS — Z6824 Body mass index (BMI) 24.0-24.9, adult: Secondary | ICD-10-CM | POA: Diagnosis not present

## 2020-10-16 DIAGNOSIS — M150 Primary generalized (osteo)arthritis: Secondary | ICD-10-CM | POA: Diagnosis not present

## 2020-10-16 DIAGNOSIS — Z7952 Long term (current) use of systemic steroids: Secondary | ICD-10-CM | POA: Diagnosis not present

## 2020-10-16 MED ORDER — LOSARTAN POTASSIUM 50 MG PO TABS: 50.0000 mg | ORAL_TABLET | Freq: Every day | ORAL | 3 refills | Status: DC

## 2020-10-16 NOTE — Telephone Encounter (Signed)
Patient calling with BP readings:  Saturday 164/87  [id number]  Monday 174/84  Patient states she will be at a doctors appointment from 10-12pm today.

## 2020-10-19 ENCOUNTER — Other Ambulatory Visit: Payer: Self-pay | Admitting: Family Medicine

## 2020-10-20 ENCOUNTER — Other Ambulatory Visit: Payer: Self-pay | Admitting: Family Medicine

## 2020-10-26 ENCOUNTER — Other Ambulatory Visit: Payer: Self-pay | Admitting: Family Medicine

## 2020-10-27 ENCOUNTER — Ambulatory Visit (INDEPENDENT_AMBULATORY_CARE_PROVIDER_SITE_OTHER): Payer: Medicare PPO | Admitting: Family Medicine

## 2020-10-27 ENCOUNTER — Other Ambulatory Visit: Payer: Self-pay | Admitting: Family Medicine

## 2020-10-27 ENCOUNTER — Encounter: Payer: Self-pay | Admitting: Family Medicine

## 2020-10-27 ENCOUNTER — Other Ambulatory Visit: Payer: Self-pay

## 2020-10-27 VITALS — BP 138/60 | HR 95 | Temp 97.9°F | Resp 19 | Ht 65.0 in | Wt 153.4 lb

## 2020-10-27 DIAGNOSIS — I5030 Unspecified diastolic (congestive) heart failure: Secondary | ICD-10-CM | POA: Insufficient documentation

## 2020-10-27 DIAGNOSIS — K219 Gastro-esophageal reflux disease without esophagitis: Secondary | ICD-10-CM

## 2020-10-27 DIAGNOSIS — I10 Essential (primary) hypertension: Secondary | ICD-10-CM | POA: Diagnosis not present

## 2020-10-27 MED ORDER — PANTOPRAZOLE SODIUM 40 MG PO TBEC: 40.0000 mg | DELAYED_RELEASE_TABLET | Freq: Every day | ORAL | 1 refills | Status: DC

## 2020-10-27 NOTE — Patient Instructions (Signed)
Follow up as needed or as scheduled No med changes at this time- BP looks great!!! Keep up the good work!  You look great! Call with any questions or concerns Stay Safe!  Stay Healthy! Emmit Alexanders with Surgery!!! Happy Holidays!!!

## 2020-10-27 NOTE — Progress Notes (Signed)
   Subjective:    Patient ID: [name], female    DOB: 10/22/1923, 86 y.o.   MRN: [id number]  HPI ER f/u- pt went to ER on 12/2 w/ elevated BP.  BP was ranging 140-200.  EKG was unchanged, labs unremarkable.  No meds were given and she was told to f/u as outpt.  BP is well controlled today at 130/80.  Currently on Diltiazem 120mg , Losartan 50mg , Nadolol 80mg  daily.  The Losartan was added by me after her ER visit to help lower BP and it is working.  Pt reports intermittent light headedness has resolved.  No CP, SOB, HAs, edema.  Needs refill on Protonix   Review of Systems For ROS see HPI   This visit occurred during the SARS-CoV-2 public health emergency.  Safety protocols were in place, including screening questions prior to the visit, additional usage of staff PPE, and extensive cleaning of exam room while observing appropriate contact time as indicated for disinfecting solutions.       Objective:   Physical Exam Vitals reviewed.  Constitutional:      General: She is not in acute distress.    Appearance: Normal appearance. She is not ill-appearing.  HENT:     Head: Normocephalic and atraumatic.  Cardiovascular:     Rate and Rhythm: Normal rate and regular rhythm.     Pulses: Normal pulses.  Pulmonary:     Effort: Pulmonary effort is normal.     Breath sounds: Normal breath sounds.  Musculoskeletal:     Right lower leg: No edema.     Left lower leg: No edema.  Skin:    General: Skin is warm and dry.  Neurological:     General: No focal deficit present.     Mental Status: She is alert and oriented to person, place, and time.  Psychiatric:        Mood and Affect: Mood normal.        Behavior: Behavior normal.        Thought Content: Thought content normal.           Assessment & Plan:  GERD- Protonix refilled

## 2020-10-31 ENCOUNTER — Ambulatory Visit: Payer: Medicare PPO | Admitting: Internal Medicine

## 2020-10-31 ENCOUNTER — Encounter: Payer: Self-pay | Admitting: Internal Medicine

## 2020-10-31 ENCOUNTER — Other Ambulatory Visit: Payer: Self-pay

## 2020-10-31 VITALS — BP 165/82 | HR 60 | Ht 65.0 in | Wt 158.0 lb

## 2020-10-31 DIAGNOSIS — I4819 Other persistent atrial fibrillation: Secondary | ICD-10-CM | POA: Diagnosis not present

## 2020-10-31 DIAGNOSIS — I5030 Unspecified diastolic (congestive) heart failure: Secondary | ICD-10-CM | POA: Diagnosis not present

## 2020-10-31 NOTE — Patient Instructions (Signed)
Medication Instructions:  ?Your physician recommends that you continue on your current medications as directed. Please refer to the Current Medication list given to you today. ? ?*If you need a refill on your cardiac medications before your next appointment, please call your pharmacy* ? ? ?Lab Work: ?BMET today ? ?If you have labs (blood work) drawn today and your tests are completely normal, you will receive your results only by: ?MyChart Message (if you have MyChart) OR ?A paper copy in the mail ?If you have any lab test that is abnormal or we need to change your treatment, we will call you to review the results. ? ? ?Testing/Procedures: ?None ordered. ? ? ?Follow-Up: ?At [facility], you and your health needs are our priority.  As part of our continuing mission to provide you with exceptional heart care, we have created designated Provider Care Teams.  These Care Teams include your primary Cardiologist (physician) and Advanced Practice Providers (APPs -  Physician Assistants and Nurse Practitioners) who all work together to provide you with the care you need, when you need it. ? ?We recommend signing up for the patient portal called "MyChart".  Sign up information is provided on this After Visit Summary.  MyChart is used to connect with patients for Virtual Visits (Telemedicine).  Patients are able to view lab/test results, encounter notes, upcoming appointments, etc.  Non-urgent messages can be sent to your provider as well.   ?To learn more about what you can do with MyChart, go to [email or web address].   ? ?Your next appointment:   ?6 month(s) ? ?The format for your next appointment:   ?In Person ? ?Provider:   ?Steven Klein, MD ?}  ? ? ? ?

## 2020-10-31 NOTE — Progress Notes (Signed)
Patient Care Team: [name], MD as PCP - General (Family Medicine) Susa Day, MD as Consulting Physician (Orthopedic Surgery) Druscilla Brownie, MD as Consulting Physician (Dermatology) Greer Pickerel, MD as Consulting Physician (General Surgery) Iran Planas, MD as Consulting Physician (Orthopedic Surgery) Michael Boston, MD as Consulting Physician (Colon and Rectal Surgery) Gavin Pound, MD as Consulting Physician (Rheumatology) Deboraha Sprang, MD as Consulting Physician (Cardiology)   HPI  [name] is a 86 y.o. female Seen in follow-up for atrial fibrillation for which she had been previously on amiodarone without benefit.  History of provoked DVT.  Elected to undertake cardioversion to see if we could make an determination as to whether sinus was better.  6/21. Post cardioversion course was complicated by pneumonia. Following discharge from pneumonia she fell. Recently hospitalized 7/21 with a fall and intramuscular hematoma.    She is scheduled for surgery for the takedown of her colostomy.  Functional status is quite good.  She walks without dyspnea no edema.  No chest pain.  No interval atrial fibrillation of which she is aware.  No bleeding except for his superficial bleeding  Was in the emergency room 12/21.  Note reviewed.  Blood pressure greater than 200.  Losartan started.  Tolerating.  Blood pressures in the 140-160 range now.    DATE TEST EF   7/21 Echo  55-60 % LAE severe          Date Cr K Hgb  12/21 0.7 3.9 13.1            Records and Results Reviewed   Past Medical History:  Diagnosis Date  . DVT (deep venous thrombosis) ([place]) 09/2015   RLE  . GERD (gastroesophageal reflux disease)   . Hypertension   . Hypothyroidism   . 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 Peripheral vascular disease ([place])    peripheral neuropathy    Past Surgical History:  Procedure Laterality Date  .  ABDOMINAL HYSTERECTOMY    . APPENDECTOMY    . BACK SURGERY    . BREAST SURGERY     left breast biopsy  . CARDIOVERSION N/A 05/09/2020   Procedure: CARDIOVERSION;  Surgeon: Skeet Latch, MD;  Location: [place];  Service: Cardiovascular;  Laterality: N/A;  . COLOSTOMY N/A 01/06/2020   Procedure: Colostomy;  Surgeon: Greer Pickerel, MD;  Location: [place];  Service: General;  Laterality: N/A;  . EYE SURGERY     cataract  . HARDWARE REMOVAL Left 01/01/2020   Procedure: HARDWARE REMOVAL;  Surgeon: Iran Planas, MD;  Location: [place];  Service: Orthopedics;  Laterality: Left;  . LAPAROTOMY N/A 01/06/2020   Procedure: Exploratory Laparotomy, Open Sigmoid colectomy;  Surgeon: Greer Pickerel, MD;  Location: [place];  Service: General;  Laterality: N/A;  . LIPOMA EXCISION Left 04/25/2015   Procedure: EXCISION OF LIPOMA LEFT ARM;  Surgeon: Donnie Mesa, MD;  Location: [place];  Service: General;  Laterality: Left;  . LUMBAR LAMINECTOMY/DECOMPRESSION MICRODISCECTOMY  11/20/2011   Procedure: LUMBAR LAMINECTOMY/DECOMPRESSION MICRODISCECTOMY;  Surgeon: Jeneen Rinks P [place];  Location: [facility];  Service: Orthopedics;  Laterality: N/A;  Decompressive Laminectomy L2 to the Sacrum (X-Ray)  . OPEN REDUCTION INTERNAL FIXATION (ORIF) DISTAL RADIAL FRACTURE Left 01/01/2020   Procedure: OPEN REDUCTION INTERNAL FIXATION (ORIF) DISTAL RADIAL FRACTURE;  Surgeon: Iran Planas, MD;  Location: [place];  Service: Orthopedics;  Laterality: Left;  . OTHER SURGICAL HISTORY     left wrist surgery -  has plate in left wrist   . OTHER SURGICAL HISTORY     right knee surgery due to torn cartilage   . TONSILLECTOMY    . WRIST FRACTURE SURGERY Left     Current Meds  Medication Sig  . acetaminophen (TYLENOL) 325 MG tablet Take 1-2 tablets (325-650 mg total) by mouth every 4 (four) hours as needed for mild pain.  Marland Kitchen apixaban (ELIQUIS) 5 MG TABS tablet Take 1 tablet (5 mg total) by mouth 2 (two) times daily.  Marland Kitchen  diltiazem (DILACOR XR) 120 MG 24 hr capsule Take 1 capsule (120 mg total) by mouth daily. (Patient taking differently: Take 240 mg by mouth daily.)  . docusate sodium (COLACE) 100 MG capsule Take 1 capsule (100 mg total) by mouth 2 (two) times daily.  Marland Kitchen gabapentin (NEURONTIN) 300 MG capsule Take 300 mg by mouth at bedtime.  Marland Kitchen levothyroxine (SYNTHROID) 50 MCG tablet TAKE 1 TABLET BY MOUTH EVERY DAY (Patient taking differently: Take 50 mcg by mouth daily before breakfast.)  . losartan (COZAAR) 50 MG tablet Take 1 tablet (50 mg total) by mouth daily.  . Multiple Vitamins-Minerals (PRESERVISION AREDS 2 PO) Take 1 tablet by mouth daily at 12 noon.  . nadolol (CORGARD) 80 MG tablet TAKE 1 TABLET BY MOUTH EVERY DAY  . pantoprazole (PROTONIX) 40 MG tablet Take 1 tablet (40 mg total) by mouth daily.  . polyethylene glycol (MIRALAX / GLYCOLAX) 17 g packet Take 17 g by mouth in the morning, at noon, and at bedtime.  . predniSONE (DELTASONE) 5 MG tablet Take 5 mg by mouth daily with lunch.  . senna-docusate (SENOKOT-S) 8.6-50 MG tablet Take 1 tablet by mouth at bedtime.    Allergies  Allergen Reactions  . Codeine Other (See Comments)    Nightmares, imagining things  . Keflex [Cephalexin] Nausea Only    Pt ended up in ER w/ CP      Review of Systems negative except from HPI and PMH  Physical Exam BP (!) 165/82   Pulse 60   Ht 5\' 5"  (1.651 m)   Wt 158 lb (71.7 kg)   SpO2 96%   BMI 26.29 kg/m  Well developed and nourished in no acute distress HENT normal Neck supple with JVP  Clear Regular rate and rhythm, no murmurs or gallops Abd-soft with active BS No Clubbing cyanosis edema Skin-warm and dry A & Oriented  Grossly normal sensory and motor function  ECG sinuis @ 60 22/08/42 Isolated PVC     Assessment and  Plan  Atrial fibrillation persistent  Hypertension  HFpEF  Preoperative evaluation  Anemia Holding sinus rhythm.  Feeling better.  Functional status is  reasonable.  Cardiac risks acceptable for takedown of her colostomy.  Anticipate that she would have perioperative atrial fibrillation.  She has gone fast.  She will need intravenous AV nodal conduction agents.  Cardiology will be available.  Anemia seems to be resolved.  Blood pressure has been better with the addition of losartan.  We will check a metabolic profile on her today.  Event that she needs further therapies, realized that her target should probably be in the mid 130s-140s given her age and longstanding hypertension, will probably add low-dose HCTZ  Current medicines are reviewed at length with the patient today .  The patient does not  have concerns regarding medicines.

## 2020-11-01 ENCOUNTER — Telehealth: Payer: Self-pay | Admitting: Internal Medicine

## 2020-11-01 LAB — BASIC METABOLIC PANEL
BUN/Creatinine Ratio: 14 (ref 12–28)
BUN: 13 mg/dL (ref 10–36)
CO2: 26 mmol/L (ref 20–29)
Calcium: 9.6 mg/dL (ref 8.7–10.3)
Chloride: 104 mmol/L (ref 96–106)
Creatinine, Ser: 0.94 mg/dL (ref 0.57–1.00)
GFR calc Af Amer: 60 mL/min/{1.73_m2} (ref >59)
GFR calc non Af Amer: 52 mL/min/{1.73_m2} — ABNORMAL LOW (ref >59)
Glucose: 108 mg/dL — ABNORMAL HIGH (ref 65–99)
Potassium: 5.1 mmol/L (ref 3.5–5.2)
Sodium: 142 mmol/L (ref 134–144)

## 2020-11-01 NOTE — Telephone Encounter (Addendum)
Spoke with pt who is questioning if Dr Caryl Comes mad a change to her Diltiazem at her appointment yesterday.  Pt advised Dr Caryl Comes did not make change and to continue medication as prescribed by her PCP.  Pt verbalizes understanding and thanked Therapist, sports for the call.

## 2020-11-01 NOTE — Telephone Encounter (Signed)
Patient would like to talk to [facility] about her AVS. Please call

## 2020-11-02 NOTE — Patient Instructions (Addendum)
DUE TO COVID-19 ONLY ONE VISITOR IS ALLOWED TO COME WITH YOU AND STAY IN THE WAITING ROOM ONLY DURING PRE OP AND PROCEDURE DAY OF SURGERY. THE 1 VISITOR  MAY VISIT WITH YOU AFTER SURGERY IN YOUR PRIVATE ROOM DURING VISITING HOURS ONLY!  YOU NEED TO HAVE A COVID 19 TEST ON_1/4______ @_12 :45______, THIS TEST MUST BE DONE BEFORE SURGERY,  COVID TESTING SITE [place] [id number], IT IS ON THE RIGHT GOING OUT [address] APPROXIMATELY  2 MINUTES PAST [employer] ON THE RIGHT. ONCE YOUR COVID TEST IS COMPLETED,  PLEASE BEGIN THE QUARANTINE INSTRUCTIONS AS OUTLINED IN YOUR HANDOUT.                ISZABELLA [name]    Your procedure is scheduled on: 11/17/21   Report to [facility] Main  Entrance   Report to admitting at   10:00 AM     Call this number if you have problems the morning of surgery [phone]   Follow Dr. Clyda Greener instructions for bowel prep.  Drink plenty of liquid the day before surgery to prevent dehydration  . BRUSH YOUR TEETH MORNING OF SURGERY AND RINSE YOUR MOUTH OUT, NO CHEWING GUM CANDY OR MINTS.  DRINK 2 PRESURGERY ENSURE DRINKS THE NIGHT BEFORE SURGERY AT 10:00 PM .   NO SOLIDS AFTER MIDNIGHT THE DAY PRIOR TO THE SURGERY.   NOTHING BY MOUTH EXCEPT CLEAR LIQUIDS UNTIL  9:00 AM   PLEASE FINISH PRESURGERY ENSURE DRINK PER SURGEON ORDER 3 HOURS PRIOR TO SCHEDULED SURGERY TIME WHICH NEEDS TO BE COMPLETED AT _9:00 am________.     Take these medicines the morning of surgery with A SIP OF WATER: Nadolol, Diltiazem, Prednisone, Levothyroxine, Pantoprazole                                 You may not have any metal on your body including hair pins and              piercings  Do not wear jewelry, make-up, lotions, powders or perfumes, deodorant             Do not wear nail polish on your fingernails.  Do not shave  48 hours prior to surgery.                 Do not bring valuables to the hospital. [place].  Contacts, dentures or bridgework may not be worn into surgery.                    Please read over the following fact sheets you were given: _____________________________________________________________________             [facility] - Preparing for Surgery Before surgery, you can play an important role.   Because skin is not sterile, your skin needs to be as free of germs as possible.   You can reduce the number of germs on your skin by washing with CHG (chlorahexidine gluconate) soap before surgery.   CHG is an antiseptic cleaner which kills germs and bonds with the skin to continue killing germs even after washing. Please DO NOT use if you have an allergy to CHG or antibacterial soap s.  If your skin becomes reddened/irritated stop using the CHG and inform your nurse  when you arrive at Short Stay. Do not shave (including legs and underarms) for at least 48 hours prior to the first CHG shower.   Please follow these instructions carefully:  1.  Shower with CHG Soap the night before surgery and the  morning of Surgery.  2.  If you choose to wash your hair, wash your hair first as usual with your  normal  shampoo.  3.  After you shampoo, rinse your hair and body thoroughly to remove the  shampoo.                                        4.  Use CHG as you would any other liquid soap.  You can apply chg directly  to the skin and wash                       Gently with a scrungie or clean washcloth.  5.  Apply the CHG Soap to your body ONLY FROM THE NECK DOWN.   Do not use on face/ open                           Wound or open sores. Avoid contact with eyes, ears mouth and genitals (private parts).                       Wash face,  Genitals (private parts) with your normal soap.             6.  Wash thoroughly, paying special attention to the area where your surgery  will be performed.  7.  Thoroughly rinse your body with warm water from the neck down.  8.  DO  NOT shower/wash with your normal soap after using and rinsing off  the CHG Soap.             9.  Pat yourself dry with a clean towel.            10.  Wear clean pajamas.            11.  Place clean sheets on your bed the night of your first shower and do not  sleep with pets. Day of Surgery : Do not apply any lotions/deodorants the morning of surgery.  Please wear clean clothes to the hospital/surgery center.  FAILURE TO FOLLOW THESE INSTRUCTIONS MAY RESULT IN THE CANCELLATION OF YOUR SURGERY PATIENT SIGNATURE_________________________________  NURSE SIGNATURE__________________________________  ________________________________________________________________________   [name]  An incentive spirometer is a tool that can help keep your lungs clear and active. This tool measures how well you are filling your lungs with each breath. Taking long deep breaths may help reverse or decrease the chance of developing breathing (pulmonary) problems (especially infection) following:  A long period of time when you are unable to move or be active. BEFORE THE PROCEDURE   If the spirometer includes an indicator to show your best effort, your nurse or respiratory therapist will set it to a desired goal.  If possible, sit up straight or lean slightly forward. Try not to slouch.  Hold the incentive spirometer in an upright position. INSTRUCTIONS FOR USE  1. Sit on the edge of your bed if possible, or sit up as far as you can in bed or on a chair. 2. Hold the incentive spirometer in  an upright position. 3. Breathe out normally. 4. Place the mouthpiece in your mouth and seal your lips tightly around it. 5. Breathe in slowly and as deeply as possible, raising the piston or the ball toward the top of the column. 6. Hold your breath for 3-5 seconds or for as long as possible. Allow the piston or ball to fall to the bottom of the column. 7. Remove the mouthpiece from your mouth and breathe out  normally. 8. Rest for a few seconds and repeat Steps 1 through 7 at least 10 times every 1-2 hours when you are awake. Take your time and take a few normal breaths between deep breaths. 9. The spirometer may include an indicator to show your best effort. Use the indicator as a goal to work toward during each repetition. 10. After each set of 10 deep breaths, practice coughing to be sure your lungs are clear. If you have an incision (the cut made at the time of surgery), support your incision when coughing by placing a pillow or rolled up towels firmly against it. Once you are able to get out of bed, walk around indoors and cough well. You may stop using the incentive spirometer when instructed by your caregiver.  RISKS AND COMPLICATIONS  Take your time so you do not get dizzy or light-headed.  If you are in pain, you may need to take or ask for pain medication before doing incentive spirometry. It is harder to take a deep breath if you are having pain. AFTER USE  Rest and breathe slowly and easily.  It can be helpful to keep track of a log of your progress. Your caregiver can provide you with a simple table to help with this. If you are using the spirometer at home, follow these instructions: [place] IF:   You are having difficultly using the spirometer.  You have trouble using the spirometer as often as instructed.  Your pain medication is not giving enough relief while using the spirometer.  You develop fever of 100.5 F (38.1 C) or higher. SEEK IMMEDIATE MEDICAL CARE IF:   You cough up bloody sputum that had not been present before.  You develop fever of 102 F (38.9 C) or greater.  You develop worsening pain at or near the incision site. MAKE SURE YOU:   Understand these instructions.  Will watch your condition.  Will get help right away if you are not doing well or get worse. Document Released: 03/10/2007 Document Revised: 01/20/2012 Document Reviewed:  05/11/2007 [facility] Patient Information 2014 [place], [place].   ________________________________________________________________________

## 2020-11-06 ENCOUNTER — Other Ambulatory Visit: Payer: Self-pay | Admitting: Family Medicine

## 2020-11-06 ENCOUNTER — Telehealth: Payer: Self-pay | Admitting: Family Medicine

## 2020-11-06 ENCOUNTER — Other Ambulatory Visit: Payer: Self-pay

## 2020-11-06 MED ORDER — DILTIAZEM HCL ER 120 MG PO CP24: 120.0000 mg | ORAL_CAPSULE | Freq: Every day | ORAL | Status: DC

## 2020-11-06 NOTE — Telephone Encounter (Signed)
Patient last seen in November, and was taking Diltiazem 120 mg daily. Med list says patient is taking differently, 240 mg daily. Please advise on dosing.

## 2020-11-06 NOTE — Telephone Encounter (Signed)
According to my last note she is on the 120mg  daily.  We can refill this for her

## 2020-11-06 NOTE — Telephone Encounter (Signed)
Medication sent to patient's pharmacy.

## 2020-11-06 NOTE — Telephone Encounter (Signed)
..  Medication Refills  Medication:  Dilacor XR  Ph[facility] [place]  ** Let patient know to contact pharmacy at the end of the day to make sure medication is ready.**  ** Please notify patient to allow 48-72 hours to process.**  ** Encourage patient to contact the pharmacy for refills or they can request refills through [facility]**  Clinical Fills out below:   Last refill:  QTY:  Refill Date:    Other Comments:  Patients daughter states that she took her last pill today.  Please advise   Okay for refill?  Please advise.

## 2020-11-08 ENCOUNTER — Encounter (HOSPITAL_COMMUNITY): Payer: Self-pay

## 2020-11-08 ENCOUNTER — Other Ambulatory Visit: Payer: Self-pay

## 2020-11-08 ENCOUNTER — Encounter (HOSPITAL_COMMUNITY)
Admission: RE | Admit: 2020-11-08 | Discharge: 2020-11-08 | Disposition: A | Discharge status: Home / Self Care | Payer: Medicare PPO | Source: Ambulatory Visit | Attending: Surgery | Admitting: Surgery

## 2020-11-08 DIAGNOSIS — Z01812 Encounter for preprocedural laboratory examination: Secondary | ICD-10-CM | POA: Diagnosis not present

## 2020-11-08 LAB — BASIC METABOLIC PANEL
Anion gap: 10 (ref 5–15)
BUN: 16 mg/dL (ref 8–23)
CO2: 27 mmol/L (ref 22–32)
Calcium: 9.5 mg/dL (ref 8.9–10.3)
Chloride: 106 mmol/L (ref 98–111)
Creatinine, Ser: 0.92 mg/dL (ref 0.44–1.00)
GFR, Estimated: 57 mL/min — ABNORMAL LOW (ref >60)
Glucose, Bld: 104 mg/dL — ABNORMAL HIGH (ref 70–99)
Potassium: 4.4 mmol/L (ref 3.5–5.1)
Sodium: 143 mmol/L (ref 135–145)

## 2020-11-08 LAB — CBC
HCT: 40.4 % (ref 36.0–46.0)
Hemoglobin: 12.8 g/dL (ref 12.0–15.0)
MCH: 30.2 pg (ref 26.0–34.0)
MCHC: 31.7 g/dL (ref 30.0–36.0)
MCV: 95.3 fL (ref 80.0–100.0)
Platelets: 319 10*3/uL (ref 150–400)
RBC: 4.24 MIL/uL (ref 3.87–5.11)
RDW: 15.6 % — ABNORMAL HIGH (ref 11.5–15.5)
WBC: 9.7 10*3/uL (ref 4.0–10.5)
nRBC: 0 % (ref 0.0–0.2)

## 2020-11-08 LAB — HEMOGLOBIN A1C
Hgb A1c MFr Bld: 6 % — ABNORMAL HIGH (ref 4.8–5.6)
Mean Plasma Glucose: 125.5 mg/dL

## 2020-11-08 NOTE — Progress Notes (Signed)
COVID Vaccine Completed:Yes Date COVID Vaccine completed:01/30/20-Booster-09/2020 COVID vaccine manufacturer: Pfizer     PCP - Dr. Kirtland Bouchard. [place] Cardiologist - Dr. Odessa Fleming  Chest x-ray - 05/11/20-epic EKG - 10/31/20-epic Stress Test - no ECHO - 05/12/20-epic Cardiac Cath - no Pacemaker/ICD device last checked:NA  Sleep Study - no CPAP -   Fasting Blood Sugar - NA Checks Blood Sugar _____ times a day  Blood Thinner Instructions:Eliqis for A-fib/Dr. Graciela Husbands Aspirin Instructions:Stop 5 days prior to DOS /Dr. Graciela Husbands Last Dose:11/11/20  Anesthesia review:   Patient denies shortness of breath, fever, cough and chest pain at PAT appointment yes  Patient verbalized understanding of instructions that were given to them at the PAT appointment. Patient was also instructed that they will need to review over the PAT instructions again at home before surgery. Yes Pt doesn't climb stairs because she uses a walker to help with balance issues. She is in physical therapy for it. No SOB with doing housework or with ADLs

## 2020-11-09 NOTE — Anesthesia Preprocedure Evaluation (Addendum)
Anesthesia Evaluation    Reviewed: Allergy & Precautions, Patient's Chart, lab work & pertinent test results  Airway Mallampati: III  TM Distance: >3 FB Neck ROM: Full    Dental no notable dental hx. (+) Teeth Intact, Dental Advisory Given   Pulmonary neg pulmonary ROS,    Pulmonary exam normal breath sounds clear to auscultation       Cardiovascular hypertension, Pt. on medications + Peripheral Vascular Disease, +CHF and + DVT  Normal cardiovascular exam+ dysrhythmias Atrial Fibrillation  Rhythm:Regular Rate:Normal     Neuro/Psych negative neurological ROS  negative psych ROS   GI/Hepatic Neg liver ROS, GERD  Controlled,  Endo/Other  Hypothyroidism   Renal/GU negative Renal ROS  negative genitourinary   Musculoskeletal  (+) Arthritis ,   Abdominal   Peds  Hematology  (+) Blood dyscrasia (on eliquis), ,   Anesthesia Other Findings Prednisone   TTE 2021 1. Left ventricular ejection fraction, by estimation, is 55 to 60%. The left ventricle has normal function. The left ventricle has no regional wall motion abnormalities. There is mild concentric left ventricular hypertrophy. Left ventricular diastolic parameters are consistent with Grade II diastolic dysfunction (pseudonormalization).  2. Right ventricular systolic function is low normal. The right ventricular size is normal. There is mildly elevated pulmonary artery systolic pressure.  3. Left atrial size was severely dilated.  4. Right atrial size was mildly dilated.  5. The mitral valve is degenerative. Mild mitral valve regurgitation.  6. The aortic valve is tricuspid. Aortic valve regurgitation is mild.  7. Aortic dilatation noted. There is mild dilatation of the ascending aorta. There is Moderate (Grade III) atheroma plaque.  8. The inferior vena cava is dilated in size with <50% respiratory variability, suggesting right atrial pressure of 15 mmHg.    Reproductive/Obstetrics                          Anesthesia Physical Anesthesia Plan  ASA: III  Anesthesia Plan: General   Post-op Pain Management:    Induction: Intravenous  PONV Risk Score and Plan: 3 and Dexamethasone, Ondansetron and Treatment may vary due to age or medical condition  Airway Management Planned: Oral ETT  Additional Equipment:   Intra-op Plan:   Post-operative Plan: Extubation in OR  Informed Consent: I have reviewed the patients History and Physical, chart, labs and discussed the procedure including the risks, benefits and alternatives for the proposed anesthesia with the patient or authorized representative who has indicated his/her understanding and acceptance.     Dental advisory given  Plan Discussed with: CRNA  Anesthesia Plan Comments:        Anesthesia Quick Evaluation

## 2020-11-14 ENCOUNTER — Other Ambulatory Visit (HOSPITAL_COMMUNITY)
Admission: RE | Admit: 2020-11-14 | Discharge: 2020-11-14 | Disposition: A | Discharge status: Home / Self Care | Payer: Medicare PPO | Source: Ambulatory Visit | Attending: Surgery | Admitting: Surgery

## 2020-11-14 DIAGNOSIS — Z01812 Encounter for preprocedural laboratory examination: Secondary | ICD-10-CM | POA: Insufficient documentation

## 2020-11-14 DIAGNOSIS — Z20822 Contact with and (suspected) exposure to covid-19: Secondary | ICD-10-CM | POA: Diagnosis not present

## 2020-11-14 LAB — SARS CORONAVIRUS 2 (TAT 6-24 HRS): SARS Coronavirus 2: NEGATIVE

## 2020-11-15 ENCOUNTER — Telehealth: Payer: Self-pay

## 2020-11-15 DIAGNOSIS — E875 Hyperkalemia: Secondary | ICD-10-CM

## 2020-11-15 NOTE — Telephone Encounter (Signed)
Spoke with pt and advised per Dr Graciela Husbands pt's potassium level did not go down.  Pt reports she is doing well and BP has improved with systolic running in the 140's.  Pt is scheduled for surgery on 11/17/2020.  Pt advised will need repeat BMET and scheduled for 11/29/2020 at 1130am.  Pt verbalizes understanding and agrees with current plan.

## 2020-11-15 NOTE — Telephone Encounter (Signed)
Attempted phone call to pt.  Phone line is busy. 

## 2020-11-15 NOTE — Telephone Encounter (Signed)
-----   Message from Duke Salvia, MD sent at 11/13/2020  3:07 PM EST ----- Please Inform Patient that potassium levels did not go down with the new med ( hctz)  but it did go up ????  Could we do two things plz 1) recheck BMET in 2 weeks 2) see how her BP is doing Thanks

## 2020-11-15 NOTE — Telephone Encounter (Signed)
Attempted phone call to pt's home phone.  Phone line remains busy.  Call made to mobile number and left voicemail message to contact RN at [phone].

## 2020-11-16 ENCOUNTER — Encounter (HOSPITAL_COMMUNITY): Payer: Self-pay | Admitting: Surgery

## 2020-11-16 MED ORDER — SODIUM CHLORIDE 0.9 % IV SOLN
INTRAVENOUS | Status: AC
  Filled 2020-11-16: qty 6

## 2020-11-16 MED ORDER — BUPIVACAINE LIPOSOME 1.3 % IJ SUSP
20.0000 mL | Freq: Once | INTRAMUSCULAR | Status: DC
  Filled 2020-11-16: qty 20

## 2020-11-17 ENCOUNTER — Other Ambulatory Visit: Payer: Self-pay

## 2020-11-17 MED ORDER — DILTIAZEM HCL ER 120 MG PO CP24: 120.0000 mg | ORAL_CAPSULE | Freq: Every day | ORAL | 1 refills | Status: DC

## 2020-11-17 NOTE — Telephone Encounter (Signed)
Patient's daughter called stating the patient needs a refill on her diltiazem 120 mg [facility] [place]. Prescription was sent to pharmacy per patient this morning. Daughter states patient is supposed to be on diltiazem 120 mg caps, 2 caps daily. Please advise on mediation instructions so rx can be sent to pharmacy. Patient's daughter can be reached at [phone], and at the office before 1pm at [phone].

## 2020-11-17 NOTE — Telephone Encounter (Signed)
Please ask Ms Altland (not a family member) if she is taking her Diltiazem 120mg  as 1 pill or 2 pills.  Her BP has been well controlled so I want to prescribe her what she has been taking

## 2020-11-20 NOTE — Telephone Encounter (Signed)
Ok to fill per refill protocol as patient takes. Will leave note for T[place]i to see as [facility]

## 2020-11-20 NOTE — Telephone Encounter (Signed)
Spoke with pt. Orders placed for BMET per Dr Caryl Comes.  Appointment scheduled for repeat BMET 11/29/2020.  Pt verbalizes understanding and thanked Therapist, sports for the call.

## 2020-11-20 NOTE — Telephone Encounter (Signed)
Called patient, she states she takes two 120mg  caps in the morning. Patient states she has been taking them this way for a while now. She states you were the one that changed it.

## 2020-11-22 ENCOUNTER — Other Ambulatory Visit: Payer: Self-pay | Admitting: Family Medicine

## 2020-11-23 ENCOUNTER — Encounter (HOSPITAL_BASED_OUTPATIENT_CLINIC_OR_DEPARTMENT_OTHER): Payer: Self-pay

## 2020-11-23 ENCOUNTER — Other Ambulatory Visit: Payer: Self-pay

## 2020-11-23 ENCOUNTER — Telehealth: Payer: Self-pay

## 2020-11-23 ENCOUNTER — Emergency Department (HOSPITAL_BASED_OUTPATIENT_CLINIC_OR_DEPARTMENT_OTHER)
Admission: EM | Admit: 2020-11-23 | Discharge: 2020-11-23 | Disposition: A | Discharge status: Home / Self Care | Payer: Medicare PPO | Attending: Emergency Medicine | Admitting: Emergency Medicine

## 2020-11-23 ENCOUNTER — Emergency Department (HOSPITAL_BASED_OUTPATIENT_CLINIC_OR_DEPARTMENT_OTHER): Payer: Medicare PPO

## 2020-11-23 ENCOUNTER — Ambulatory Visit: Payer: Medicare PPO | Admitting: Physician Assistant

## 2020-11-23 DIAGNOSIS — Z7901 Long term (current) use of anticoagulants: Secondary | ICD-10-CM | POA: Insufficient documentation

## 2020-11-23 DIAGNOSIS — R918 Other nonspecific abnormal finding of lung field: Secondary | ICD-10-CM | POA: Diagnosis not present

## 2020-11-23 DIAGNOSIS — I10 Essential (primary) hypertension: Secondary | ICD-10-CM | POA: Diagnosis not present

## 2020-11-23 DIAGNOSIS — I517 Cardiomegaly: Secondary | ICD-10-CM | POA: Diagnosis not present

## 2020-11-23 DIAGNOSIS — E039 Hypothyroidism, unspecified: Secondary | ICD-10-CM | POA: Insufficient documentation

## 2020-11-23 DIAGNOSIS — R0789 Other chest pain: Secondary | ICD-10-CM | POA: Diagnosis not present

## 2020-11-23 DIAGNOSIS — R42 Dizziness and giddiness: Secondary | ICD-10-CM | POA: Insufficient documentation

## 2020-11-23 DIAGNOSIS — Z79899 Other long term (current) drug therapy: Secondary | ICD-10-CM | POA: Insufficient documentation

## 2020-11-23 DIAGNOSIS — Z86718 Personal history of other venous thrombosis and embolism: Secondary | ICD-10-CM | POA: Diagnosis not present

## 2020-11-23 DIAGNOSIS — Z85820 Personal history of malignant melanoma of skin: Secondary | ICD-10-CM | POA: Diagnosis not present

## 2020-11-23 DIAGNOSIS — R079 Chest pain, unspecified: Secondary | ICD-10-CM | POA: Diagnosis not present

## 2020-11-23 DIAGNOSIS — J9811 Atelectasis: Secondary | ICD-10-CM | POA: Diagnosis not present

## 2020-11-23 LAB — BASIC METABOLIC PANEL
Anion gap: 10 (ref 5–15)
BUN: 20 mg/dL (ref 8–23)
CO2: 27 mmol/L (ref 22–32)
Calcium: 9.2 mg/dL (ref 8.9–10.3)
Chloride: 102 mmol/L (ref 98–111)
Creatinine, Ser: 0.95 mg/dL (ref 0.44–1.00)
GFR, Estimated: 55 mL/min — ABNORMAL LOW (ref >60)
Glucose, Bld: 92 mg/dL (ref 70–99)
Potassium: 3.8 mmol/L (ref 3.5–5.1)
Sodium: 139 mmol/L (ref 135–145)

## 2020-11-23 LAB — TROPONIN I (HIGH SENSITIVITY)
Troponin I (High Sensitivity): 8 ng/L (ref <18)
Troponin I (High Sensitivity): 8 ng/L (ref <18)

## 2020-11-23 LAB — CBC
HCT: 40.9 % (ref 36.0–46.0)
Hemoglobin: 13.3 g/dL (ref 12.0–15.0)
MCH: 30.2 pg (ref 26.0–34.0)
MCHC: 32.5 g/dL (ref 30.0–36.0)
MCV: 93 fL (ref 80.0–100.0)
Platelets: 301 10*3/uL (ref 150–400)
RBC: 4.4 MIL/uL (ref 3.87–5.11)
RDW: 15.5 % (ref 11.5–15.5)
WBC: 8.6 10*3/uL (ref 4.0–10.5)
nRBC: 0 % (ref 0.0–0.2)

## 2020-11-23 MED ORDER — DILTIAZEM HCL ER COATED BEADS 120 MG PO CP24: ORAL_CAPSULE | ORAL | 2 refills | Status: DC

## 2020-11-23 NOTE — Telephone Encounter (Signed)
Diltiazem Cardizem CD 120mg  24hr capsule

## 2020-11-23 NOTE — Telephone Encounter (Signed)
Son Lucianne Lei) called stating that patient had chest pressure and felt dizzy.  States patient has similar symptoms a couple days ago.  Son was sent to [facility].  [facility] advised patient to call EMS now.    Daughter Jana Half) called back stating that the EMS and Fire Department did a full evaluation and EKG on patient.  States EKG was good, pressure was subsiding, and that EMS believed patient was only having acid reflux.  After speaking with Einar Pheasant, his advise was for patient to still be evaluated due to patient's symptoms, history and risk.  I informed son and daughter.  Jana Half stated that the Fire Department and EMS advised for patient not be take to the ED right now.    At the end of the conversation daughter stated she understood.

## 2020-11-23 NOTE — ED Notes (Signed)
Resting quietly, states has intermittent chest pressure, 2nd Troponin Level obtained as per protocol

## 2020-11-23 NOTE — Addendum Note (Signed)
Addended by: Genevie Cheshire L on: 11/23/2020 11:18 AM   Modules accepted: Orders

## 2020-11-23 NOTE — ED Provider Notes (Signed)
[facility] Provider Note  CSN: [id number] Arrival date & time: 11/23/20 1042    History Chief Complaint  Patient presents with  . Chest Pain    HPI  [name] is a 87 y.o. female with history of PAF s/p cardioversion last year and HTN presents for evaluation of chest pain. She had a particularly severe episode of chest pressure, feeling lightheaded while out shopping 2 days ago. She sat down and symptoms improved. She felt well most of the day yesterday but had another episode this morning that was not quite as severe. She denies any SOB or diaphoresis, some mild nausea. Pain is midsternal and does not radiate to arm or jaw. She called EMS this morning and they came to evaluate her at home. She declined transport to the ED then, but after being advised by her PCP to be checked out she had her family bring her here. She is currently symptom free. She reports symptoms are somewhat similar to when she has had GERD before.    Past Medical History:  Diagnosis Date  . DVT (deep venous thrombosis) ([place]) 09/2015   RLE  . GERD (gastroesophageal reflux disease)   . Hypertension   . Hypothyroidism   . 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 Peripheral vascular disease ([place])    peripheral neuropathy    Past Surgical History:  Procedure Laterality Date  . ABDOMINAL HYSTERECTOMY    . APPENDECTOMY    . BACK SURGERY    . BREAST SURGERY     left breast biopsy  . CARDIOVERSION N/A 05/09/2020   Procedure: CARDIOVERSION;  Surgeon: Skeet Latch, MD;  Location: [facility];  Service: Cardiovascular;  Laterality: N/A;  . COLOSTOMY N/A 01/06/2020   Procedure: Colostomy;  Surgeon: Greer Pickerel, MD;  Location: [place];  Service: General;  Laterality: N/A;  . EYE SURGERY     cataract  . HARDWARE REMOVAL Left 01/01/2020   Procedure: HARDWARE REMOVAL;  Surgeon: Iran Planas, MD;  Location: [place];  Service: Orthopedics;   Laterality: Left;  . LAPAROTOMY N/A 01/06/2020   Procedure: Exploratory Laparotomy, Open Sigmoid colectomy;  Surgeon: Greer Pickerel, MD;  Location: [facility];  Service: General;  Laterality: N/A;  . LIPOMA EXCISION Left 04/25/2015   Procedure: EXCISION OF LIPOMA LEFT ARM;  Surgeon: Donnie Mesa, MD;  Location: [place];  Service: General;  Laterality: Left;  . LUMBAR LAMINECTOMY/DECOMPRESSION MICRODISCECTOMY  11/20/2011   Procedure: LUMBAR LAMINECTOMY/DECOMPRESSION MICRODISCECTOMY;  Surgeon: Jeneen Rinks P [place];  Location: [facility];  Service: Orthopedics;  Laterality: N/A;  Decompressive Laminectomy L2 to the Sacrum (X-Ray)  . OPEN REDUCTION INTERNAL FIXATION (ORIF) DISTAL RADIAL FRACTURE Left 01/01/2020   Procedure: OPEN REDUCTION INTERNAL FIXATION (ORIF) DISTAL RADIAL FRACTURE;  Surgeon: Iran Planas, MD;  Location: [place];  Service: Orthopedics;  Laterality: Left;  . OTHER SURGICAL HISTORY     left wrist surgery - has plate in left wrist   . OTHER SURGICAL HISTORY     right knee surgery due to torn cartilage   . TONSILLECTOMY    . WRIST FRACTURE SURGERY Left     Family History  Problem Relation Age of Onset  . Cancer Mother        BREAST  . COPD Father   . Emphysema Father   . Cancer Daughter        breast    Social History   Tobacco Use  . Smoking status: Never  Smoker  . Smokeless tobacco: Never Used  Vaping Use  . Vaping Use: Never used  Substance Use Topics  . Alcohol use: No  . Drug use: No     Home Medications Prior to Admission medications   Medication Sig Start Date End Date Taking? Authorizing Provider  acetaminophen (TYLENOL) 325 MG tablet Take 1-2 tablets (325-650 mg total) by mouth every 4 (four) hours as needed for mild pain. 01/19/20   Love, Ivan Anchors, PA-C  apixaban (ELIQUIS) 5 MG TABS tablet Take 1 tablet (5 mg total) by mouth 2 (two) times daily. 06/20/20   Deboraha Sprang, MD  diltiazem (CARDIZEM CD) 120 MG 24 hr capsule Take 2 capsules by mouth once  daily 11/23/20   [name], MD  diltiazem (DILACOR XR) 120 MG 24 hr capsule Take 1 capsule (120 mg total) by mouth daily. 11/17/20 02/15/21  [name], MD  docusate sodium (COLACE) 100 MG capsule Take 1 capsule (100 mg total) by mouth 2 (two) times daily. 01/20/20   Love, Ivan Anchors, PA-C  gabapentin (NEURONTIN) 300 MG capsule Take 300 mg by mouth at bedtime.    [provider]  levothyroxine (SYNTHROID) 50 MCG tablet TAKE 1 TABLET BY MOUTH EVERY DAY Patient taking differently: Take 50 mcg by mouth daily before breakfast. 10/02/20   [name], MD  losartan (COZAAR) 50 MG tablet Take 1 tablet (50 mg total) by mouth daily. 10/16/20   [name], MD  Multiple Vitamins-Minerals (PRESERVISION AREDS 2 PO) Take 1 tablet by mouth daily at 12 noon.    [provider]  nadolol (CORGARD) 80 MG tablet TAKE 1 TABLET BY MOUTH EVERY DAY 10/27/20   [name], MD  pantoprazole (PROTONIX) 40 MG tablet Take 1 tablet (40 mg total) by mouth daily. 10/27/20   [name], MD  polyethylene glycol (MIRALAX / GLYCOLAX) 17 g packet Take 17 g by mouth in the morning, at noon, and at bedtime.    [provider]  predniSONE (DELTASONE) 5 MG tablet Take 5 mg by mouth daily with lunch.    [provider]  senna-docusate (SENOKOT-S) 8.6-50 MG tablet Take 1 tablet by mouth at bedtime. 01/20/20 01/19/21  Bary Leriche, PA-C     Allergies    Codeine and Keflex [cephalexin]   Review of Systems   Review of Systems A comprehensive review of systems was completed and negative except as noted in HPI.    Physical Exam BP (!) 150/97   Pulse (!) 59   Temp 97.6 F (36.4 C) (Oral)   Resp (!) 27   Ht 5\' 6"  (1.676 m)   Wt 68.9 kg   SpO2 100%   BMI 24.53 kg/m   Physical Exam Vitals and nursing note reviewed.  Constitutional:      Appearance: Normal appearance.  HENT:     Head: Normocephalic and atraumatic.     Nose: Nose normal.      Mouth/Throat:     Mouth: Mucous membranes are moist.  Eyes:     Extraocular Movements: Extraocular movements intact.     Conjunctiva/sclera: Conjunctivae normal.  Cardiovascular:     Rate and Rhythm: Bradycardia present.  Pulmonary:     Effort: Pulmonary effort is normal.     Breath sounds: Normal breath sounds.  Abdominal:     General: Abdomen is flat.     Palpations: Abdomen is soft.     Tenderness: There is no abdominal tenderness.  Musculoskeletal:  General: No swelling. Normal range of motion.     Cervical back: Neck supple.  Skin:    General: Skin is warm and dry.  Neurological:     General: No focal deficit present.     Mental Status: She is alert.  Psychiatric:        Mood and Affect: Mood normal.      ED Results / Procedures / Treatments   Labs (all labs ordered are listed, but only abnormal results are displayed) Labs Reviewed  BASIC METABOLIC PANEL - Abnormal; Notable for the following components:      Result Value   GFR, Estimated 55 (*)    All other components within normal limits  CBC  TROPONIN I (HIGH SENSITIVITY)  TROPONIN I (HIGH SENSITIVITY)    EKG EKG Interpretation  Date/Time:  Thursday November 23 2020 11:35:18 EST Ventricular Rate:  56 PR Interval:    QRS Duration: 83 QT Interval:  430 QTC Calculation: 415 R Axis:   -58 Text Interpretation: Sinus rhythm Prolonged PR interval Left anterior fascicular block No significant change since EARLIER SAME DATE Confirmed by Calvert Cantor [phone]) on 11/23/2020 11:39:27 AM    Radiology DG Chest 2 View  Result Date: 11/23/2020 CLINICAL DATA:  Chest pain EXAM: CHEST - 2 VIEW COMPARISON:  05/11/2020 FINDINGS: Stable cardiomegaly. Atherosclerotic calcification of the aortic knob. Streaky right basilar opacity. No pleural effusion or pneumothorax. Advanced arthropathy of the left shoulder. Chronic left-sided rib fractures. IMPRESSION: Streaky right basilar opacity, favor atelectasis. Electronically  Signed   By: Davina Poke D.O.   On: 11/23/2020 11:37    Procedures Procedures  Medications Ordered in the ED Medications - No data to display   MDM Rules/Calculators/A&P MDM Patient here for evaluation of chest pain. Some typical and some atypical features. She has no prior history of CAD or stents and is currently symptom free. EKG without signs of ischemia. Will check delta trop and reassess.  ED Course  I have reviewed the triage vital signs and the nursing notes.  Pertinent labs & imaging results that were available during my care of the patient were reviewed by me and considered in my medical decision making (see chart for details).  Clinical Course as of 11/23/20 1451  Thu Nov 23, 2020  1205 CXR with atelectasis, CBC with normal WBC, no anemia. BMP and Trop also normal.  [CS]  1025 Patient's second Trop is neg. She remains symptom free. I discussed admission vs outpatient follow up for her chest pain. She prefers to go home this afternoon. She already has an appointment scheduled with her EP Cardiologist next week. She was advised to discuss her symptoms with him for consideration of additional testing such as stress test. She was encouraged to return to the ED for re-evaluation and/or admission if her symptoms continue in the meantime.  [CS]    Clinical Course User Index [CS] Truddie Hidden, MD    Final Clinical Impression(s) / ED Diagnoses Final diagnoses:  Chest pain, unspecified type    Rx / DC Orders ED Discharge Orders    None       Truddie Hidden, MD 11/23/20 1451

## 2020-11-23 NOTE — Telephone Encounter (Addendum)
This should have already been corrected as there was a previous note about this.  Per Dr. Birdie Riddle it is okay to refill per how the patient has been taking.  If she is taking 1 tablet twice daily, okay to send a new Rx to reflect this.

## 2020-11-23 NOTE — Telephone Encounter (Signed)
Patient called stating that she needs a new Rx of her Diltiazem Cardizem CD 120mg  24hr capsule. It needs to be changed to BID.

## 2020-11-23 NOTE — ED Notes (Signed)
Up to restroom, utilizes walker, gait very steady, required min assistance, voiced no complaints when ambulating to wash room

## 2020-11-23 NOTE — ED Triage Notes (Signed)
Pt reports pressure in the center of her chest that is relieved some by burping. Pt endorses hx of acid reflux. Pt states she first had this pressure on Tuesday that resolved and then it returned today along with feeling faint. Pt states she feels weak. Pt denies n/v and shortness of breath.

## 2020-11-24 ENCOUNTER — Telehealth: Payer: Self-pay

## 2020-11-24 ENCOUNTER — Telehealth: Payer: Self-pay | Admitting: Internal Medicine

## 2020-11-24 NOTE — Telephone Encounter (Signed)
Patient called stating that she spent part of her day yesterday in the ER for chest pressure. She was told that there were no negative findings with her heart. She also stated that she had been having acid reflux for a long time and been taking protonix for it and maybe she needs to be taken off of it. She would like to hear back from you at your earliest convenience.

## 2020-11-24 NOTE — Telephone Encounter (Signed)
Spoke with pt who states she has had some episodes of chest pressure.  Pt was seen in ED yesterday and cleared from a cardiac standpoint according to the patient.  Pt continues medications as prescribed and reports her colostomy reversal was canceled and rescheduled for March 2023.  Pt states she has a history of GERD and has been on Protonix 40mg  for a very long time.  Pt encouraged to contact her PCP re: GERD and chest pressure d/t negative cardiac findings.  Pt states she will contact her PCP and plans to keep he appointment with Dr Caryl Comes on 11/29/2020.  Reviewed ED precautions.  Pt verbalizes understanding and thanked Therapist, sports for the call.

## 2020-11-24 NOTE — Telephone Encounter (Signed)
Patient is calling to talk with Dr. Olin Pia nurse. Patient said she has called numerous of times and no one has called her back. Please call

## 2020-11-26 NOTE — Telephone Encounter (Signed)
She should first try increasing her Protonix to twice daily.  If that is not effective, we will need visit to discuss

## 2020-11-27 ENCOUNTER — Telehealth: Payer: Self-pay

## 2020-11-27 NOTE — Telephone Encounter (Signed)
She needs to make sure she is changing positions slowly.  She needs to have good water intake and she needs to make sure her BP isn't running too low.  If symptoms continue, she will need an appointment

## 2020-11-27 NOTE — Telephone Encounter (Signed)
Called patient and gave instructions per Dr. Birdie Riddle. Pt understood. No further concerns at this time.

## 2020-11-27 NOTE — Telephone Encounter (Signed)
Called patient and advised patient per Dr.Tabori to make sure she is changing positions and drinking  plenty of water and also to make sure b/p is not getting too low. Patient understood. No further concerns at this time.

## 2020-11-27 NOTE — Telephone Encounter (Signed)
Spoke with patient and pt understood. However patient stated that she has been real dizzy when she gets up but feels better during the day. There is no sob or nausea. Please advise

## 2020-11-29 ENCOUNTER — Other Ambulatory Visit: Payer: Self-pay

## 2020-11-29 ENCOUNTER — Other Ambulatory Visit: Payer: Medicare PPO | Admitting: *Deleted

## 2020-11-29 DIAGNOSIS — E875 Hyperkalemia: Secondary | ICD-10-CM

## 2020-11-30 ENCOUNTER — Encounter: Payer: Self-pay | Admitting: Family Medicine

## 2020-11-30 ENCOUNTER — Telehealth (INDEPENDENT_AMBULATORY_CARE_PROVIDER_SITE_OTHER): Payer: Medicare PPO | Admitting: Family Medicine

## 2020-11-30 DIAGNOSIS — K219 Gastro-esophageal reflux disease without esophagitis: Secondary | ICD-10-CM

## 2020-11-30 DIAGNOSIS — R42 Dizziness and giddiness: Secondary | ICD-10-CM

## 2020-11-30 DIAGNOSIS — R0789 Other chest pain: Secondary | ICD-10-CM | POA: Diagnosis not present

## 2020-11-30 DIAGNOSIS — R001 Bradycardia, unspecified: Secondary | ICD-10-CM | POA: Diagnosis not present

## 2020-11-30 LAB — BASIC METABOLIC PANEL
BUN/Creatinine Ratio: 12 (ref 12–28)
BUN: 14 mg/dL (ref 10–36)
CO2: 25 mmol/L (ref 20–29)
Calcium: 9.8 mg/dL (ref 8.7–10.3)
Chloride: 102 mmol/L (ref 96–106)
Creatinine, Ser: 1.15 mg/dL — ABNORMAL HIGH (ref 0.57–1.00)
GFR calc Af Amer: 47 mL/min/{1.73_m2} — ABNORMAL LOW (ref >59)
GFR calc non Af Amer: 41 mL/min/{1.73_m2} — ABNORMAL LOW (ref >59)
Glucose: 91 mg/dL (ref 65–99)
Potassium: 4.5 mmol/L (ref 3.5–5.2)
Sodium: 140 mmol/L (ref 134–144)

## 2020-11-30 MED ORDER — SUCRALFATE 1 G PO TABS: 1.0000 g | ORAL_TABLET | Freq: Three times a day (TID) | ORAL | 0 refills | Status: DC

## 2020-11-30 NOTE — Progress Notes (Signed)
I connected with  [name] on 11/30/20 by a video enabled telemedicine application and verified that I am speaking with the correct person using two identifiers.   I discussed the limitations of evaluation and management by telemedicine. The patient expressed understanding and agreed to proceed.

## 2020-11-30 NOTE — Progress Notes (Signed)
Virtual Visit via Video   I connected with patient on 11/30/20 at 10:00 AM EST by a video enabled telemedicine application and verified that I am speaking with the correct person using two identifiers.  Location patient: Home Location provider: [facility] Persons participating in the virtual visit: Patient, Provider, [place] (Sabrina M)  I discussed the limitations of evaluation and management by telemedicine and the availability of in person appointments. The patient expressed understanding and agreed to proceed.  Subjective:   HPI:   Chest pressure- sxs started Tues 1/11 while she was out shopping and she suddenly felt weak.  Thursday 1/13 things 'were much worse'.  She went to ER and had negative cardiac work up (ER notes reviewed).  'it can happen anytime'.  Woke her from sleep last night.  This AM occurred after breakfast.  'I feel like it's there all the time'  Pt reports increased GERD recently.  Some improvement in sxs w/ increasing Pantoprazole to BID.  Dizziness- started Monday.  Worse w/ turning head side to side or looking up or down.  Pt states she has had vertigo before but this feels different- denies room spinning.  Pt describes sxs as feeling 'very unsteady'  Pt reports she is ok when sitting but sxs occur when standing.  BP was 145/63, yesterday was 160/75, prior was 143/67, 135/62.  Pulse 66, 60, 55, 66, 61.  Pt reports good water intake  ROS:   See pertinent positives and negatives per HPI.  Patient Active Problem List   Diagnosis Date Noted  . (HFpEF) heart failure with preserved ejection fraction ([place]) 10/27/2020  . Injury of extensor or abductor muscle, fascia, or tendon of thumb at forearm level 08/03/2020  . Orthostatic hypotension 05/24/2020  . Acute blood loss anemia 05/19/2020  . Traumatic hematoma of left hip 05/19/2020  . Current chronic use of systemic steroids 05/19/2020  . Advanced age 27/12/2019  . Secondary hypercoagulable state  ([place]) 03/08/2020  . Hypokalemia 02/15/2020  . Colostomy status ([place]) 01/26/2020  . Diverticulitis of colon with perforation 01/26/2020  . Colostomy in place [facility]) 01/26/2020  . Anemia   . Debility 01/14/2020  . Physical debility 01/14/2020  . Persistent atrial fibrillation ([place])   . Closed fracture of left distal radius 12/29/2019  . Recurrent falls 12/29/2019  . Positive ANA (antinuclear antibody) 12/29/2019  . Osteoarthritis 11/13/2017  . Hypertriglyceridemia 01/29/2017  . Excessive gas 01/09/2017  . Physical exam 07/31/2016  . Elevated lipase 03/06/2016  . Chest pain 03/06/2016  . Osteopenia 02/08/2016  . Hypothyroidism 01/26/2016  . HTN (hypertension) 01/26/2016  . Left leg numbness 10/06/2012    Social History   Tobacco Use  . Smoking status: Never Smoker  . Smokeless tobacco: Never Used  Substance Use Topics  . Alcohol use: No    Current Outpatient Medications:  .  acetaminophen (TYLENOL) 325 MG tablet, Take 1-2 tablets (325-650 mg total) by mouth every 4 (four) hours as needed for mild pain., Disp:  , Rfl:  .  apixaban (ELIQUIS) 5 MG TABS tablet, Take 1 tablet (5 mg total) by mouth 2 (two) times daily., Disp: 180 tablet, Rfl: 3 .  diltiazem (CARDIZEM CD) 120 MG 24 hr capsule, Take 2 capsules by mouth once daily, Disp: 60 capsule, Rfl: 2 .  diltiazem (DILACOR XR) 120 MG 24 hr capsule, Take 1 capsule (120 mg total) by mouth daily., Disp: , Rfl: 1 .  docusate sodium (COLACE) 100 MG capsule, Take 1 capsule (100 mg total)  by mouth 2 (two) times daily., Disp: 60 capsule, Rfl: 0 .  gabapentin (NEURONTIN) 300 MG capsule, Take 300 mg by mouth at bedtime., Disp: , Rfl:  .  levothyroxine (SYNTHROID) 50 MCG tablet, TAKE 1 TABLET BY MOUTH EVERY DAY (Patient taking differently: Take 50 mcg by mouth daily before breakfast.), Disp: 90 tablet, Rfl: 1 .  losartan (COZAAR) 50 MG tablet, Take 1 tablet (50 mg total) by mouth daily., Disp: 30 tablet, Rfl: 3 .  nadolol (CORGARD) 80 MG  tablet, TAKE 1 TABLET BY MOUTH EVERY DAY, Disp: 90 tablet, Rfl: 1 .  pantoprazole (PROTONIX) 40 MG tablet, Take 1 tablet (40 mg total) by mouth daily. (Patient taking differently: Take 40 mg by mouth 2 (two) times daily.), Disp: 90 tablet, Rfl: 1 .  polyethylene glycol (MIRALAX / GLYCOLAX) 17 g packet, Take 17 g by mouth in the morning, at noon, and at bedtime., Disp: , Rfl:  .  predniSONE (DELTASONE) 5 MG tablet, Take 5 mg by mouth daily with lunch., Disp: , Rfl:  .  senna-docusate (SENOKOT-S) 8.6-50 MG tablet, Take 1 tablet by mouth at bedtime., Disp: 30 tablet, Rfl: 0 .  Multiple Vitamins-Minerals (PRESERVISION AREDS 2 PO), Take 1 tablet by mouth daily at 12 noon. (Patient not taking: Reported on 11/30/2020), Disp: , Rfl:  No current facility-administered medications for this visit.  Facility-Administered Medications Ordered in Other Visits:  .  bupivacaine liposome (EXPAREL) 1.3 % injection 266 mg, 20 mL, Infiltration, Once, Lenis Noon, [facility]  Allergies  Allergen Reactions  . Codeine Other (See Comments)    Nightmares, imagining things  . Keflex [Cephalexin] Nausea Only    Pt ended up in ER w/ CP    Objective:   There were no vitals taken for this visit. AAOx3, NAD NCAT, EOMI No obvious CN deficits Coloring WNL Pt is able to speak clearly, coherently without shortness of breath or increased work of breathing.  Thought process is linear.  Mood is appropriate.   Assessment and Plan:   Chest pressure- new.  Cardiac w/u in ER was negative.  sxs are more consistent w/ GERD- occurring at night, after eating, radiates up into chest.  Some improvement w/ increased PPI dose.  Add carafate.  Discussed lifestyle modifications.  Will follow.  GERD- deteriorated.  She is currently taking 2 Protonix at the same time.  Encouraged her to switch to BID dosing.  Will start Carafate to improve sxs.  Pt expressed understanding and is in agreement w/ plan.   Dizziness- new.  Suspect this is  multifactorial- symptomatic bradycardia upon standing, relative orthostatic hypotension, and possibly exacerbation of vertigo.  Will decrease Diltiazem to 1 tab daily.  Encouraged lots of water.  Stressed the need to change positions slowly.  Will follow.  Bradycardia- pt's HR while in the ER was in the 50s.  At home has been in the 16s.  Suspect that her low HR is contributing to her dizziness.  Will decrease her Diltiazem to 1 tab daily rather than 2.  Will continue to monitor closely as we may need to adjust her Nadolol if HR remains low.  Pt expressed understanding and is in agreement w/ plan.    Annye Asa, MD 11/30/2020

## 2020-12-02 ENCOUNTER — Inpatient Hospital Stay (HOSPITAL_COMMUNITY)
Admission: EM | Admit: 2020-12-02 | Discharge: 2020-12-07 | DRG: 522 | Disposition: A | Discharge status: Skilled Nursing Facility | Payer: Medicare PPO | Attending: Internal Medicine | Admitting: Internal Medicine

## 2020-12-02 ENCOUNTER — Other Ambulatory Visit: Payer: Self-pay

## 2020-12-02 ENCOUNTER — Emergency Department (HOSPITAL_COMMUNITY): Payer: Medicare PPO

## 2020-12-02 ENCOUNTER — Encounter (HOSPITAL_COMMUNITY): Payer: Self-pay

## 2020-12-02 DIAGNOSIS — I482 Chronic atrial fibrillation, unspecified: Secondary | ICD-10-CM | POA: Diagnosis present

## 2020-12-02 DIAGNOSIS — M353 Polymyalgia rheumatica: Secondary | ICD-10-CM | POA: Diagnosis not present

## 2020-12-02 DIAGNOSIS — I739 Peripheral vascular disease, unspecified: Secondary | ICD-10-CM | POA: Diagnosis not present

## 2020-12-02 DIAGNOSIS — I10 Essential (primary) hypertension: Secondary | ICD-10-CM

## 2020-12-02 DIAGNOSIS — E781 Pure hyperglyceridemia: Secondary | ICD-10-CM | POA: Diagnosis present

## 2020-12-02 DIAGNOSIS — Y9389 Activity, other specified: Secondary | ICD-10-CM

## 2020-12-02 DIAGNOSIS — E039 Hypothyroidism, unspecified: Secondary | ICD-10-CM | POA: Diagnosis not present

## 2020-12-02 DIAGNOSIS — M7062 Trochanteric bursitis, left hip: Secondary | ICD-10-CM | POA: Diagnosis not present

## 2020-12-02 DIAGNOSIS — Z7952 Long term (current) use of systemic steroids: Secondary | ICD-10-CM

## 2020-12-02 DIAGNOSIS — R42 Dizziness and giddiness: Secondary | ICD-10-CM | POA: Diagnosis not present

## 2020-12-02 DIAGNOSIS — S72009A Fracture of unspecified part of neck of unspecified femur, initial encounter for closed fracture: Secondary | ICD-10-CM | POA: Diagnosis present

## 2020-12-02 DIAGNOSIS — W1839XA Other fall on same level, initial encounter: Secondary | ICD-10-CM | POA: Diagnosis present

## 2020-12-02 DIAGNOSIS — K219 Gastro-esophageal reflux disease without esophagitis: Secondary | ICD-10-CM | POA: Diagnosis not present

## 2020-12-02 DIAGNOSIS — Z79890 Hormone replacement therapy (postmenopausal): Secondary | ICD-10-CM | POA: Diagnosis not present

## 2020-12-02 DIAGNOSIS — Z933 Colostomy status: Secondary | ICD-10-CM

## 2020-12-02 DIAGNOSIS — Z881 Allergy status to other antibiotic agents status: Secondary | ICD-10-CM

## 2020-12-02 DIAGNOSIS — Z471 Aftercare following joint replacement surgery: Secondary | ICD-10-CM | POA: Diagnosis not present

## 2020-12-02 DIAGNOSIS — R279 Unspecified lack of coordination: Secondary | ICD-10-CM | POA: Diagnosis not present

## 2020-12-02 DIAGNOSIS — Y92013 Bedroom of single-family (private) house as the place of occurrence of the external cause: Secondary | ICD-10-CM | POA: Diagnosis not present

## 2020-12-02 DIAGNOSIS — Z7902 Long term (current) use of antithrombotics/antiplatelets: Secondary | ICD-10-CM | POA: Diagnosis not present

## 2020-12-02 DIAGNOSIS — Z4789 Encounter for other orthopedic aftercare: Secondary | ICD-10-CM | POA: Diagnosis not present

## 2020-12-02 DIAGNOSIS — I252 Old myocardial infarction: Secondary | ICD-10-CM | POA: Diagnosis not present

## 2020-12-02 DIAGNOSIS — Z86718 Personal history of other venous thrombosis and embolism: Secondary | ICD-10-CM | POA: Diagnosis not present

## 2020-12-02 DIAGNOSIS — Z602 Problems related to living alone: Secondary | ICD-10-CM | POA: Diagnosis present

## 2020-12-02 DIAGNOSIS — Z85820 Personal history of malignant melanoma of skin: Secondary | ICD-10-CM

## 2020-12-02 DIAGNOSIS — Z825 Family history of asthma and other chronic lower respiratory diseases: Secondary | ICD-10-CM

## 2020-12-02 DIAGNOSIS — I5032 Chronic diastolic (congestive) heart failure: Secondary | ICD-10-CM | POA: Diagnosis present

## 2020-12-02 DIAGNOSIS — I119 Hypertensive heart disease without heart failure: Secondary | ICD-10-CM | POA: Diagnosis present

## 2020-12-02 DIAGNOSIS — Z885 Allergy status to narcotic agent status: Secondary | ICD-10-CM

## 2020-12-02 DIAGNOSIS — M1990 Unspecified osteoarthritis, unspecified site: Secondary | ICD-10-CM | POA: Diagnosis present

## 2020-12-02 DIAGNOSIS — S72052A Unspecified fracture of head of left femur, initial encounter for closed fracture: Secondary | ICD-10-CM | POA: Diagnosis not present

## 2020-12-02 DIAGNOSIS — Z79899 Other long term (current) drug therapy: Secondary | ICD-10-CM

## 2020-12-02 DIAGNOSIS — Z9181 History of falling: Secondary | ICD-10-CM | POA: Diagnosis not present

## 2020-12-02 DIAGNOSIS — I4819 Other persistent atrial fibrillation: Secondary | ICD-10-CM | POA: Diagnosis present

## 2020-12-02 DIAGNOSIS — Z20822 Contact with and (suspected) exposure to covid-19: Secondary | ICD-10-CM | POA: Diagnosis not present

## 2020-12-02 DIAGNOSIS — R296 Repeated falls: Secondary | ICD-10-CM | POA: Diagnosis present

## 2020-12-02 DIAGNOSIS — M6281 Muscle weakness (generalized): Secondary | ICD-10-CM | POA: Diagnosis not present

## 2020-12-02 DIAGNOSIS — G629 Polyneuropathy, unspecified: Secondary | ICD-10-CM | POA: Diagnosis present

## 2020-12-02 DIAGNOSIS — S72002A Fracture of unspecified part of neck of left femur, initial encounter for closed fracture: Secondary | ICD-10-CM | POA: Diagnosis not present

## 2020-12-02 DIAGNOSIS — R2681 Unsteadiness on feet: Secondary | ICD-10-CM | POA: Diagnosis not present

## 2020-12-02 DIAGNOSIS — Z803 Family history of malignant neoplasm of breast: Secondary | ICD-10-CM

## 2020-12-02 DIAGNOSIS — M25552 Pain in left hip: Secondary | ICD-10-CM | POA: Diagnosis not present

## 2020-12-02 DIAGNOSIS — I480 Paroxysmal atrial fibrillation: Secondary | ICD-10-CM | POA: Diagnosis not present

## 2020-12-02 DIAGNOSIS — R5381 Other malaise: Secondary | ICD-10-CM | POA: Diagnosis not present

## 2020-12-02 DIAGNOSIS — R2689 Other abnormalities of gait and mobility: Secondary | ICD-10-CM | POA: Diagnosis not present

## 2020-12-02 DIAGNOSIS — S7292XS Unspecified fracture of left femur, sequela: Secondary | ICD-10-CM | POA: Diagnosis not present

## 2020-12-02 DIAGNOSIS — W19XXXA Unspecified fall, initial encounter: Secondary | ICD-10-CM

## 2020-12-02 DIAGNOSIS — Z96642 Presence of left artificial hip joint: Secondary | ICD-10-CM

## 2020-12-02 DIAGNOSIS — R262 Difficulty in walking, not elsewhere classified: Secondary | ICD-10-CM | POA: Diagnosis not present

## 2020-12-02 DIAGNOSIS — Z043 Encounter for examination and observation following other accident: Secondary | ICD-10-CM | POA: Diagnosis not present

## 2020-12-02 HISTORY — DX: Fracture of unspecified part of neck of unspecified femur, initial encounter for closed fracture: S72.009A

## 2020-12-02 LAB — COMPREHENSIVE METABOLIC PANEL
ALT: 16 U/L (ref 0–44)
AST: 20 U/L (ref 15–41)
Albumin: 3.6 g/dL (ref 3.5–5.0)
Alkaline Phosphatase: 83 U/L (ref 38–126)
Anion gap: 9 (ref 5–15)
BUN: 13 mg/dL (ref 8–23)
CO2: 26 mmol/L (ref 22–32)
Calcium: 9.2 mg/dL (ref 8.9–10.3)
Chloride: 107 mmol/L (ref 98–111)
Creatinine, Ser: 0.92 mg/dL (ref 0.44–1.00)
GFR, Estimated: 57 mL/min — ABNORMAL LOW (ref >60)
Glucose, Bld: 100 mg/dL — ABNORMAL HIGH (ref 70–99)
Potassium: 3.7 mmol/L (ref 3.5–5.1)
Sodium: 142 mmol/L (ref 135–145)
Total Bilirubin: 0.6 mg/dL (ref 0.3–1.2)
Total Protein: 6.2 g/dL — ABNORMAL LOW (ref 6.5–8.1)

## 2020-12-02 LAB — CBC WITH DIFFERENTIAL/PLATELET
Abs Immature Granulocytes: 0.06 10*3/uL (ref 0.00–0.07)
Basophils Absolute: 0 10*3/uL (ref 0.0–0.1)
Basophils Relative: 0 %
Eosinophils Absolute: 0.2 10*3/uL (ref 0.0–0.5)
Eosinophils Relative: 2 %
HCT: 42.3 % (ref 36.0–46.0)
Hemoglobin: 13.3 g/dL (ref 12.0–15.0)
Immature Granulocytes: 1 %
Lymphocytes Relative: 19 %
Lymphs Abs: 2 10*3/uL (ref 0.7–4.0)
MCH: 29.8 pg (ref 26.0–34.0)
MCHC: 31.4 g/dL (ref 30.0–36.0)
MCV: 94.8 fL (ref 80.0–100.0)
Monocytes Absolute: 0.9 10*3/uL (ref 0.1–1.0)
Monocytes Relative: 8 %
Neutro Abs: 7.4 10*3/uL (ref 1.7–7.7)
Neutrophils Relative %: 70 %
Platelets: 261 10*3/uL (ref 150–400)
RBC: 4.46 MIL/uL (ref 3.87–5.11)
RDW: 15.2 % (ref 11.5–15.5)
WBC: 10.6 10*3/uL — ABNORMAL HIGH (ref 4.0–10.5)
nRBC: 0 % (ref 0.0–0.2)

## 2020-12-02 LAB — PROTIME-INR
INR: 1.2 (ref 0.8–1.2)
Prothrombin Time: 15.1 seconds (ref 11.4–15.2)

## 2020-12-02 MED ORDER — DOCUSATE SODIUM 100 MG PO CAPS
100.0000 mg | ORAL_CAPSULE | Freq: Two times a day (BID) | ORAL | Status: DC
  Administered 2020-12-02 – 2020-12-07 (×9): 100 mg via ORAL
  Filled 2020-12-02 (×9): qty 1

## 2020-12-02 MED ORDER — HYDROMORPHONE HCL 1 MG/ML IJ SOLN
0.5000 mg | INTRAMUSCULAR | Status: DC | PRN
  Administered 2020-12-02 – 2020-12-03 (×3): 0.5 mg via INTRAVENOUS
  Filled 2020-12-02 (×2): qty 0.5
  Filled 2020-12-02: qty 1

## 2020-12-02 MED ORDER — POVIDONE-IODINE 10 % EX SWAB: 2.0000 "application " | Freq: Once | CUTANEOUS | Status: DC

## 2020-12-02 MED ORDER — FENTANYL CITRATE (PF) 100 MCG/2ML IJ SOLN
50.0000 ug | Freq: Once | INTRAMUSCULAR | Status: AC
  Administered 2020-12-02: 50 ug via INTRAVENOUS
  Filled 2020-12-02: qty 2

## 2020-12-02 MED ORDER — PREDNISONE 5 MG PO TABS
5.0000 mg | ORAL_TABLET | Freq: Every day | ORAL | Status: DC
  Administered 2020-12-03 – 2020-12-05 (×3): 5 mg via ORAL
  Filled 2020-12-02 (×4): qty 1

## 2020-12-02 MED ORDER — CHLORHEXIDINE GLUCONATE 4 % EX LIQD: 60.0000 mL | Freq: Once | CUTANEOUS | Status: DC

## 2020-12-02 MED ORDER — OXYCODONE HCL 5 MG PO TABS: 5.0000 mg | ORAL_TABLET | ORAL | Status: DC | PRN

## 2020-12-02 MED ORDER — LEVOTHYROXINE SODIUM 50 MCG PO TABS
50.0000 ug | ORAL_TABLET | Freq: Every day | ORAL | Status: DC
  Administered 2020-12-04 – 2020-12-07 (×4): 50 ug via ORAL
  Filled 2020-12-02 (×4): qty 1

## 2020-12-02 MED ORDER — PANTOPRAZOLE SODIUM 40 MG PO TBEC
40.0000 mg | DELAYED_RELEASE_TABLET | Freq: Two times a day (BID) | ORAL | Status: DC
  Administered 2020-12-03 – 2020-12-05 (×4): 40 mg via ORAL
  Filled 2020-12-02 (×4): qty 1

## 2020-12-02 MED ORDER — CEFAZOLIN SODIUM-DEXTROSE 2-4 GM/100ML-% IV SOLN
2.0000 g | INTRAVENOUS | Status: AC
  Administered 2020-12-03: 2 g via INTRAVENOUS
  Filled 2020-12-02: qty 100

## 2020-12-02 MED ORDER — POLYETHYLENE GLYCOL 3350 17 G PO PACK
17.0000 g | PACK | Freq: Every day | ORAL | Status: DC
  Administered 2020-12-02 – 2020-12-07 (×6): 17 g via ORAL
  Filled 2020-12-02 (×6): qty 1

## 2020-12-02 MED ORDER — DILTIAZEM HCL ER COATED BEADS 120 MG PO CP24
120.0000 mg | ORAL_CAPSULE | Freq: Every day | ORAL | Status: DC
  Administered 2020-12-03 – 2020-12-07 (×5): 120 mg via ORAL
  Filled 2020-12-02 (×5): qty 1

## 2020-12-02 MED ORDER — GABAPENTIN 300 MG PO CAPS
300.0000 mg | ORAL_CAPSULE | Freq: Every day | ORAL | Status: DC
  Administered 2020-12-02 – 2020-12-06 (×5): 300 mg via ORAL
  Filled 2020-12-02 (×5): qty 1

## 2020-12-02 MED ORDER — NADOLOL 40 MG PO TABS
80.0000 mg | ORAL_TABLET | Freq: Every day | ORAL | Status: DC
  Administered 2020-12-04 – 2020-12-07 (×4): 80 mg via ORAL
  Filled 2020-12-02 (×6): qty 2

## 2020-12-02 MED ORDER — SUCRALFATE 1 G PO TABS
1.0000 g | ORAL_TABLET | Freq: Three times a day (TID) | ORAL | Status: DC
  Administered 2020-12-03 – 2020-12-07 (×13): 1 g via ORAL
  Filled 2020-12-02 (×16): qty 1

## 2020-12-02 MED ORDER — SENNOSIDES-DOCUSATE SODIUM 8.6-50 MG PO TABS
1.0000 | ORAL_TABLET | Freq: Every day | ORAL | Status: DC
  Administered 2020-12-02 – 2020-12-06 (×5): 1 via ORAL
  Filled 2020-12-02 (×5): qty 1

## 2020-12-02 MED ORDER — ACETAMINOPHEN 325 MG PO TABS: 325.0000 mg | ORAL_TABLET | ORAL | Status: DC | PRN

## 2020-12-02 NOTE — ED Triage Notes (Addendum)
Pt arrived to ED via [facility] from home. EMS called out to home where pt lives by herself, for a fall. EMS reports pt said she felt dizzy and was trying to walk back to her bed when she fell from standing. Pt reports she fell backwards and to the L w/ L hip impact. Pt was using her walker at the time.. Pt c/o L hip and L femur pain. EMS did not note any shortening or rotation, but noted pain w/ movement and palpation. Pt is also on Eliquis for A-fib and had dosages changed recently d/t increased dose made her dizzy.

## 2020-12-02 NOTE — ED Provider Notes (Signed)
[facility] EMERGENCY DEPARTMENT Provider Note   CSN: [id number] Arrival date & time: 12/02/20  1326     History Chief Complaint  Patient presents with  . Fall  . Hip Pain    Left    [name] is a 87 y.o. female.  87 y.o female with a PMH of Afib on eliquis presents to the ED s/p fall. Patient reports she was ambulating with her walker when she suddenly went to reach up on the closet, felt somewhat dizzy therefore reports falling backwards striking the left side of her body. Reports pain along the left hip, has not been able to weight-bear since the incident. She was transported to the ED via EMS, reports there is significant pain with movement of the left leg, however no shortening or rotation noted by EMS. Has not taken any medications for improvement in her symptoms. Pain is alleviated with rest and exacerbated with movement. She was recently seen via telehealth by PCP, had a medication change as provider thought "I was overmedicated ". She denies any headache, loss of consciousness, striking her head, neck pain, chest pain, shortness of breath.  The history is provided by the patient.  Fall This is a new problem. The current episode started less than 1 hour ago. The problem occurs rarely. The problem has not changed since onset.Pertinent negatives include no chest pain, no abdominal pain, no headaches and no shortness of breath. The symptoms are aggravated by walking. The symptoms are relieved by lying down. She has tried nothing for the symptoms.  Hip Pain Pertinent negatives include no chest pain, no abdominal pain, no headaches and no shortness of breath.       Past Medical History:  Diagnosis Date  . DVT (deep venous thrombosis) ([place]) 09/2015   RLE  . GERD (gastroesophageal reflux disease)   . Hypertension   . Hypothyroidism   . 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 Peripheral vascular disease ([place])    peripheral  neuropathy    Patient Active Problem List   Diagnosis Date Noted  . (HFpEF) heart failure with preserved ejection fraction ([place]) 10/27/2020  . Injury of extensor or abductor muscle, fascia, or tendon of thumb at forearm level 08/03/2020  . Orthostatic hypotension 05/24/2020  . Acute blood loss anemia 05/19/2020  . Traumatic hematoma of left hip 05/19/2020  . Current chronic use of systemic steroids 05/19/2020  . Advanced age 64/12/2019  . Secondary hypercoagulable state ([place]) 03/08/2020  . Hypokalemia 02/15/2020  . Colostomy status ([place]) 01/26/2020  . Diverticulitis of colon with perforation 01/26/2020  . Colostomy in place [facility]) 01/26/2020  . Anemia   . Debility 01/14/2020  . Physical debility 01/14/2020  . Persistent atrial fibrillation ([place])   . Closed fracture of left distal radius 12/29/2019  . Recurrent falls 12/29/2019  . Positive ANA (antinuclear antibody) 12/29/2019  . Osteoarthritis 11/13/2017  . Hypertriglyceridemia 01/29/2017  . Excessive gas 01/09/2017  . Physical exam 07/31/2016  . Elevated lipase 03/06/2016  . Chest pain 03/06/2016  . Osteopenia 02/08/2016  . Hypothyroidism 01/26/2016  . HTN (hypertension) 01/26/2016  . Left leg numbness 10/06/2012    Past Surgical History:  Procedure Laterality Date  . ABDOMINAL HYSTERECTOMY    . APPENDECTOMY    . BACK SURGERY    . BREAST SURGERY     left breast biopsy  . CARDIOVERSION N/A 05/09/2020   Procedure: CARDIOVERSION;  Surgeon: Skeet Latch, MD;  Location: [facility];  Service: Cardiovascular;  Laterality: N/A;  . COLOSTOMY N/A 01/06/2020   Procedure: Colostomy;  Surgeon: Greer Pickerel, MD;  Location: [facility];  Service: General;  Laterality: N/A;  . EYE SURGERY     cataract  . HARDWARE REMOVAL Left 01/01/2020   Procedure: HARDWARE REMOVAL;  Surgeon: Iran Planas, MD;  Location: [facility];  Service: Orthopedics;  Laterality: Left;  . LAPAROTOMY N/A 01/06/2020   Procedure: Exploratory Laparotomy, Open Sigmoid  colectomy;  Surgeon: Greer Pickerel, MD;  Location: [facility];  Service: General;  Laterality: N/A;  . LIPOMA EXCISION Left 04/25/2015   Procedure: EXCISION OF LIPOMA LEFT ARM;  Surgeon: Donnie Mesa, MD;  Location: [place];  Service: General;  Laterality: Left;  . LUMBAR LAMINECTOMY/DECOMPRESSION MICRODISCECTOMY  11/20/2011   Procedure: LUMBAR LAMINECTOMY/DECOMPRESSION MICRODISCECTOMY;  Surgeon: Jeneen Rinks P [place];  Location: [facility];  Service: Orthopedics;  Laterality: N/A;  Decompressive Laminectomy L2 to the Sacrum (X-Ray)  . OPEN REDUCTION INTERNAL FIXATION (ORIF) DISTAL RADIAL FRACTURE Left 01/01/2020   Procedure: OPEN REDUCTION INTERNAL FIXATION (ORIF) DISTAL RADIAL FRACTURE;  Surgeon: Iran Planas, MD;  Location: [place] [facility];  Service: Orthopedics;  Laterality: Left;  . OTHER SURGICAL HISTORY     left wrist surgery - has plate in left wrist   . OTHER SURGICAL HISTORY     right knee surgery due to torn cartilage   . TONSILLECTOMY    . WRIST FRACTURE SURGERY Left      OB History   No obstetric history on file.     Family History  Problem Relation Age of Onset  . Cancer Mother        BREAST  . COPD Father   . Emphysema Father   . Cancer Daughter        breast    Social History   Tobacco Use  . Smoking status: Never Smoker  . Smokeless tobacco: Never Used  Vaping Use  . Vaping Use: Never used  Substance Use Topics  . Alcohol use: No  . Drug use: No    Home Medications Prior to Admission medications   Medication Sig Start Date End Date Taking? Authorizing Provider  acetaminophen (TYLENOL) 325 MG tablet Take 1-2 tablets (325-650 mg total) by mouth every 4 (four) hours as needed for mild pain. 01/19/20   Love, Ivan Anchors, PA-C  apixaban (ELIQUIS) 5 MG TABS tablet Take 1 tablet (5 mg total) by mouth 2 (two) times daily. 06/20/20   Deboraha Sprang, MD  diltiazem (CARDIZEM CD) 120 MG 24 hr capsule Take 2 capsules by mouth once daily 11/23/20   [name], MD   diltiazem (DILACOR XR) 120 MG 24 hr capsule Take 1 capsule (120 mg total) by mouth daily. 11/17/20 02/15/21  [name], MD  docusate sodium (COLACE) 100 MG capsule Take 1 capsule (100 mg total) by mouth 2 (two) times daily. 01/20/20   Love, Ivan Anchors, PA-C  gabapentin (NEURONTIN) 300 MG capsule Take 300 mg by mouth at bedtime.    [provider]  levothyroxine (SYNTHROID) 50 MCG tablet TAKE 1 TABLET BY MOUTH EVERY DAY Patient taking differently: Take 50 mcg by mouth daily before breakfast. 10/02/20   [name], MD  losartan (COZAAR) 50 MG tablet Take 1 tablet (50 mg total) by mouth daily. 10/16/20   [name], MD  Multiple Vitamins-Minerals (PRESERVISION AREDS 2 PO) Take 1 tablet by mouth daily at 12 noon. Patient not taking: Reported on 11/30/2020    [provider]  nadolol (CORGARD) 80 MG tablet TAKE 1 TABLET BY MOUTH EVERY DAY 10/27/20   [name], MD  pantoprazole (PROTONIX) 40 MG tablet Take 1 tablet (40 mg total) by mouth daily. Patient taking differently: Take 40 mg by mouth 2 (two) times daily. 10/27/20   [name], MD  polyethylene glycol (MIRALAX / GLYCOLAX) 17 g packet Take 17 g by mouth in the morning, at noon, and at bedtime.    [provider]  predniSONE (DELTASONE) 5 MG tablet Take 5 mg by mouth daily with lunch.    [provider]  senna-docusate (SENOKOT-S) 8.6-50 MG tablet Take 1 tablet by mouth at bedtime. 01/20/20 01/19/21  Love, Ivan Anchors, PA-C  sucralfate (CARAFATE) 1 g tablet Take 1 tablet (1 g total) by mouth with breakfast, with lunch, and with evening meal. 11/30/20   [name], MD    Allergies    Codeine and Keflex [cephalexin]  Review of Systems   Review of Systems  Constitutional: Negative for fever.  HENT: Negative for sore throat.   Respiratory: Negative for shortness of breath.   Cardiovascular: Negative for chest pain.  Gastrointestinal: Negative for abdominal pain,  nausea and vomiting.  Genitourinary: Negative for flank pain.  Musculoskeletal: Positive for arthralgias.  Neurological: Positive for dizziness. Negative for weakness and headaches.  All other systems reviewed and are negative.   Physical Exam Updated Vital Signs BP (!) 181/63 (BP Location: Right Arm)   Pulse 95   Temp 98 F (36.7 C) (Oral)   Resp 15   Ht 5\' 7"  (1.702 m)   Wt 68.9 kg   SpO2 98%   BMI 23.81 kg/m   Physical Exam Vitals and nursing note reviewed.  Constitutional:      Appearance: Normal appearance.  HENT:     Head: Normocephalic and atraumatic.     Comments: No visible abrasion, laceration, or obvious deformity noted.    Nose: Nose normal.  Neck:     Comments: Full range of motion of the neck without any pain. No cervical spine tenderness. Pulmonary:     Effort: Pulmonary effort is normal.     Breath sounds: No wheezing.  Abdominal:     General: Abdomen is flat.     Palpations: Abdomen is soft.     Tenderness: There is no abdominal tenderness.  Musculoskeletal:     Cervical back: Normal range of motion and neck supple. No tenderness.     Left hip: Tenderness and bony tenderness present. No deformity, lacerations or crepitus. Decreased range of motion. Normal strength.       Legs:     Comments: Pulses present, limited range of motion due to pain. No obvious laceration noted. Decreased strength with extension and flexion.  Skin:    General: Skin is warm and dry.  Neurological:     Mental Status: She is alert and oriented to person, place, and time.     ED Results / Procedures / Treatments   Labs (all labs ordered are listed, but only abnormal results are displayed) Labs Reviewed  CBC WITH DIFFERENTIAL/PLATELET  COMPREHENSIVE METABOLIC PANEL  PROTIME-INR    EKG None  Radiology No results found.  Procedures Procedures (including critical care time)  Medications Ordered in ED Medications  fentaNYL (SUBLIMAZE) injection 50 mcg (has no  administration in time range)  fentaNYL (SUBLIMAZE) injection 50 mcg (50 mcg Intravenous Given 12/02/20 1415)    ED Course  I have reviewed the triage vital signs and the nursing  notes.  Pertinent labs & imaging results that were available during my care of the patient were reviewed by me and considered in my medical decision making (see chart for details).    MDM Rules/Calculators/A&P   Patient arrived via EMS status post fall. Reports getting dizzy while ambulating with her walker and reaching over the closet. Recently had medication change, she was thought to be overmedicated by her PCP. Reports no headache, did not strike her head, no loss of consciousness, no cervical spine tenderness. Vitals are within normal limits on today's visit. Does endorse left hip pain this is exacerbated with movement and alleviated with rest. Has not taken any medication for improvement in her symptoms.  She is currently on Eliquis. The rest of her exam is unremarkable, no visible deformity noted to the left hip, no signs of rotation or shortening. Patient provided with fentanyl for pain control while in the ED.  DG Left hip: Reviewed by me, left femoral neck fracture, closed present.   2:57 PM Call placed to orthopedist for further recommendation. Patient informed of results of her xray at this time.   3:04 PM Spoke to Dr. Percell Miller who will have patient go under surgical fixation tomorrow.   Patient is pending labs prior to admission into the hospital.Covid test ordered.  Patient care signed out to incoming team, waiting on labs and admission.    Portions of this note were generated with Lobby[employer]. Dictation errors may occur despite best attempts at proofreading.  Final Clinical Impression(s) / ED Diagnoses Final diagnoses:  Fall, initial encounter  Left hip pain    Rx / DC Orders ED Discharge Orders    None       Janeece Fitting, Hershal Coria 12/02/20 1506    Luna Fuse,  MD 12/02/20 1526

## 2020-12-02 NOTE — ED Provider Notes (Signed)
   Received signout at the beginning of shift, please see previous providers notes for complete H&P.  This is a 87 year old female significant history of orthostatic hypotension, recent medication changes, hyperlipidemia, CHF, recurrent fall, osteopenia who fell earlier today at home.  Fall was due to dizziness from recent medication changes.  X-ray demonstrated a left femoral neck fracture.  Orthopedics, Dr. Percell Miller is planning on perform surgery tomorrow and request medicine for admission.  5:19 PM Appreciate consultation from [facility] hospitalist, Dr. Sloan Leiter who agrees to see and admit patient for further care.  At this time patient family request for [facility] to be the group that perform hip surgery as she has had relationship with that specific group in the past.  I will page per request.   Domenic Moras, PA-C 12/03/20 2206    Drenda Freeze, MD 12/06/20 1047

## 2020-12-02 NOTE — ED Notes (Signed)
Pt transported to CT at this time.

## 2020-12-02 NOTE — H&P (Signed)
ORTHOPAEDIC CONSULTATION  REQUESTING PHYSICIAN: Drenda Freeze, MD  Chief Complaint: Left hip fracture  HPI: [name] is a 87 y.o. female who complains of left hip pain after fall. Pt. States that she was at home and fell backwards lading onto her left hip. She now has pain in her left hip.  Imaging shows left femoral neck fracture.  Orthopedics was consulted for evaluation.   Positive history of DVT and a-fib on eliquis. history of MI, CVA, PE.  Previously ambulatory with walker.  The patient was living alone.   Past Medical History:  Diagnosis Date  . DVT (deep venous thrombosis) ([place]) 09/2015   RLE  . GERD (gastroesophageal reflux disease)   . Hypertension   . Hypothyroidism   . Melanoma ([place]) 06/16/2017   Facial melanoma, removed by Dr. Harvel Quale  . Peripheral vascular disease ([place])    peripheral neuropathy   Past Surgical History:  Procedure Laterality Date  . ABDOMINAL HYSTERECTOMY    . APPENDECTOMY    . BACK SURGERY    . BREAST SURGERY     left breast biopsy  . CARDIOVERSION N/A 05/09/2020   Procedure: CARDIOVERSION;  Surgeon: Skeet Latch, MD;  Location: [facility];  Service: Cardiovascular;  Laterality: N/A;  . COLOSTOMY N/A 01/06/2020   Procedure: Colostomy;  Surgeon: Greer Pickerel, MD;  Location: [facility];  Service: General;  Laterality: N/A;  . EYE SURGERY     cataract  . HARDWARE REMOVAL Left 01/01/2020   Procedure: HARDWARE REMOVAL;  Surgeon: Iran Planas, MD;  Location: [facility];  Service: Orthopedics;  Laterality: Left;  . LAPAROTOMY N/A 01/06/2020   Procedure: Exploratory Laparotomy, Open Sigmoid colectomy;  Surgeon: Greer Pickerel, MD;  Location: [facility];  Service: General;  Laterality: N/A;  . LIPOMA EXCISION Left 04/25/2015   Procedure: EXCISION OF LIPOMA LEFT ARM;  Surgeon: Donnie Mesa, MD;  Location: [facility];  Service: General;  Laterality: Left;  . LUMBAR LAMINECTOMY/DECOMPRESSION MICRODISCECTOMY  11/20/2011   Procedure:  LUMBAR LAMINECTOMY/DECOMPRESSION MICRODISCECTOMY;  Surgeon: Jeneen Rinks P [place];  Location: [facility];  Service: Orthopedics;  Laterality: N/A;  Decompressive Laminectomy L2 to the Sacrum (X-Ray)  . OPEN REDUCTION INTERNAL FIXATION (ORIF) DISTAL RADIAL FRACTURE Left 01/01/2020   Procedure: OPEN REDUCTION INTERNAL FIXATION (ORIF) DISTAL RADIAL FRACTURE;  Surgeon: Iran Planas, MD;  Location: [facility];  Service: Orthopedics;  Laterality: Left;  . OTHER SURGICAL HISTORY     left wrist surgery - has plate in left wrist   . OTHER SURGICAL HISTORY     right knee surgery due to torn cartilage   . TONSILLECTOMY    . WRIST FRACTURE SURGERY Left    Social History   Socioeconomic History  . Marital status: Widowed    Spouse name: Not on file  . Number of children: Not on file  . Years of education: Not on file  . Highest education level: Not on file  Occupational History  . Not on file  Tobacco Use  . Smoking status: Never Smoker  . Smokeless tobacco: Never Used  Vaping Use  . Vaping Use: Never used  Substance and Sexual Activity  . Alcohol use: No  . Drug use: No  . Sexual activity: Never  Other Topics Concern  . Not on file  Social History Narrative  . Not on file   Social Determinants of Health   Financial Resource Strain: Low Risk   . Difficulty of Paying Living Expenses: Not hard at all  Food  Insecurity: No Food Insecurity  . Worried About Charity fundraiser in the Last Year: Never true  . Ran Out of Food in the Last Year: Never true  Transportation Needs: No Transportation Needs  . Lack of Transportation (Medical): No  . Lack of Transportation (Non-Medical): No  Physical Activity: Insufficiently Active  . Days of Exercise per Week: 2 days  . Minutes of Exercise per Session: 60 min  Stress: No Stress Concern Present  . Feeling of Stress : Not at all  Social Connections: Moderately Isolated  . Frequency of Communication with Friends and Family: More than three times a week  .  Frequency of Social Gatherings with Friends and Family: Once a week  . Attends Religious Services: More than 4 times per year  . Active Member of Clubs or Organizations: No  . Attends Archivist Meetings: Never  . Marital Status: Widowed   Family History  Problem Relation Age of Onset  . Cancer Mother        BREAST  . COPD Father   . Emphysema Father   . Cancer Daughter        breast   Allergies  Allergen Reactions  . Keflex [Cephalexin] Nausea Only and Other (See Comments)    Pt ended up in ER w/ CHEST PAIN  . Codeine Other (See Comments)    "Nightmares, imagined things"   Prior to Admission medications   Medication Sig Start Date End Date Taking? Authorizing Provider  acetaminophen (TYLENOL) 325 MG tablet Take 1-2 tablets (325-650 mg total) by mouth every 4 (four) hours as needed for mild pain. 01/19/20   Love, Ivan Anchors, PA-C  apixaban (ELIQUIS) 5 MG TABS tablet Take 1 tablet (5 mg total) by mouth 2 (two) times daily. 06/20/20   Deboraha Sprang, MD  diltiazem (CARDIZEM CD) 120 MG 24 hr capsule Take 2 capsules by mouth once daily 11/23/20   [name], MD  diltiazem (DILACOR XR) 120 MG 24 hr capsule Take 1 capsule (120 mg total) by mouth daily. 11/17/20 02/15/21  [name], MD  docusate sodium (COLACE) 100 MG capsule Take 1 capsule (100 mg total) by mouth 2 (two) times daily. 01/20/20   Love, Ivan Anchors, PA-C  gabapentin (NEURONTIN) 300 MG capsule Take 300 mg by mouth at bedtime.    [provider]  levothyroxine (SYNTHROID) 50 MCG tablet TAKE 1 TABLET BY MOUTH EVERY DAY Patient taking differently: Take 50 mcg by mouth daily before breakfast. 10/02/20   [name], MD  losartan (COZAAR) 50 MG tablet Take 1 tablet (50 mg total) by mouth daily. 10/16/20   [name], MD  Multiple Vitamins-Minerals (PRESERVISION AREDS 2 PO) Take 1 tablet by mouth daily at 12 noon. Patient not taking: Reported on 11/30/2020    [provider]   nadolol (CORGARD) 80 MG tablet TAKE 1 TABLET BY MOUTH EVERY DAY 10/27/20   [name], MD  pantoprazole (PROTONIX) 40 MG tablet Take 1 tablet (40 mg total) by mouth daily. Patient taking differently: Take 40 mg by mouth 2 (two) times daily before a meal. 10/27/20   [name], MD  polyethylene glycol (MIRALAX / GLYCOLAX) 17 g packet Take 17 g by mouth in the morning, at noon, and at bedtime.    [provider]  predniSONE (DELTASONE) 5 MG tablet Take 5 mg by mouth daily with lunch.    [provider]  senna-docusate (SENOKOT-S) 8.6-50 MG tablet Take 1 tablet  by mouth at bedtime. 01/20/20 01/19/21  Love, Ivan Anchors, PA-C  sucralfate (CARAFATE) 1 g tablet Take 1 tablet (1 g total) by mouth with breakfast, with lunch, and with evening meal. 11/30/20   [name], MD   CT Head Wo Contrast  Result Date: 12/02/2020 CLINICAL DATA:  Dizziness, fall backwards EXAM: CT HEAD WITHOUT CONTRAST CT CERVICAL SPINE WITHOUT CONTRAST TECHNIQUE: Multidetector CT imaging of the head and cervical spine was performed following the standard protocol without intravenous contrast. Multiplanar CT image reconstructions of the cervical spine were also generated. COMPARISON:  CT head dated 05/14/2020 FINDINGS: CT HEAD FINDINGS Brain: No evidence of acute infarction, hemorrhage, hydrocephalus, extra-axial collection or mass lesion/mass effect. Mild age related atrophy. Subcortical white matter and periventricular small vessel ischemic changes. Vascular: Mild intracranial atherosclerosis. Skull: Normal. Negative for fracture or focal lesion. Sinuses/Orbits: The visualized paranasal sinuses are essentially clear. The mastoid air cells are unopacified. Other: None. CT CERVICAL SPINE FINDINGS Alignment: Mild straightening of the cervical spine, likely positional. Skull base and vertebrae: No acute fracture. No primary bone lesion or focal pathologic process. Soft tissues and spinal canal: No  prevertebral fluid or swelling. No visible canal hematoma. Disc levels: Mild degenerative changes of the mid cervical spine. Spinal canal is patent. Upper chest: Visualized lung apices are notable for mild biapical pleural-parenchymal scarring. Other: Visualized thyroid is unremarkable. IMPRESSION: No evidence of acute intracranial abnormality. Age related atrophy with small vessel ischemic changes. No evidence of traumatic injury to the cervical spine. Mild degenerative changes. Electronically Signed   By: Julian Hy M.D.   On: 12/02/2020 15:33   CT Cervical Spine Wo Contrast  Result Date: 12/02/2020 CLINICAL DATA:  Dizziness, fall backwards EXAM: CT HEAD WITHOUT CONTRAST CT CERVICAL SPINE WITHOUT CONTRAST TECHNIQUE: Multidetector CT imaging of the head and cervical spine was performed following the standard protocol without intravenous contrast. Multiplanar CT image reconstructions of the cervical spine were also generated. COMPARISON:  CT head dated 05/14/2020 FINDINGS: CT HEAD FINDINGS Brain: No evidence of acute infarction, hemorrhage, hydrocephalus, extra-axial collection or mass lesion/mass effect. Mild age related atrophy. Subcortical white matter and periventricular small vessel ischemic changes. Vascular: Mild intracranial atherosclerosis. Skull: Normal. Negative for fracture or focal lesion. Sinuses/Orbits: The visualized paranasal sinuses are essentially clear. The mastoid air cells are unopacified. Other: None. CT CERVICAL SPINE FINDINGS Alignment: Mild straightening of the cervical spine, likely positional. Skull base and vertebrae: No acute fracture. No primary bone lesion or focal pathologic process. Soft tissues and spinal canal: No prevertebral fluid or swelling. No visible canal hematoma. Disc levels: Mild degenerative changes of the mid cervical spine. Spinal canal is patent. Upper chest: Visualized lung apices are notable for mild biapical pleural-parenchymal scarring. Other:  Visualized thyroid is unremarkable. IMPRESSION: No evidence of acute intracranial abnormality. Age related atrophy with small vessel ischemic changes. No evidence of traumatic injury to the cervical spine. Mild degenerative changes. Electronically Signed   By: Julian Hy M.D.   On: 12/02/2020 15:33   DG HIP UNILAT WITH PELVIS 2-3 VIEWS LEFT  Result Date: 12/02/2020 CLINICAL DATA:  Fall, left hip pain EXAM: DG HIP (WITH OR WITHOUT PELVIS) 2-3V LEFT COMPARISON:  05/19/2020 FINDINGS: Acute fracture of the left femoral neck with slight impaction and minimal varus angulation. No dislocation. Mild degenerative changes of the bilateral hips. Bones are demineralized. Bony pelvis intact. Degenerative disc disease of the lower lumbar spine. IMPRESSION: Acute fracture of the left femoral neck with slight impaction and minimal varus angulation. Electronically  Signed   By: Davina Poke D.O.   On: 12/02/2020 15:11    Positive ROS: All other systems have been reviewed and were otherwise negative with the exception of those mentioned in the HPI and as above.  Objective: Labs cbc Recent Labs    12/02/20 1420  WBC 10.6*  HGB 13.3  HCT 42.3  PLT 261    Labs inflam No results for input(s): CRP in the last 72 hours.  Invalid input(s): ESR  Labs coag Recent Labs    12/02/20 1420  INR 1.2    Recent Labs    12/02/20 1420  NA 142  K 3.7  CL 107  CO2 26  GLUCOSE 100*  BUN 13  CREATININE 0.92  CALCIUM 9.2    Physical Exam: Vitals:   12/02/20 1330 12/02/20 1543  BP: (!) 181/63 (!) 176/58  Pulse: 95 64  Resp: 15 16  Temp: 98 F (36.7 C)   SpO2: 98% 97%   General: Alert, no acute distress.  Mental status: Alert and Oriented x3 Neurologic: Speech Clear and organized, no gross focal findings or movement disorder appreciated. Respiratory: No cyanosis, no use of accessory musculature Cardiovascular: No pedal edema GI: Abdomen is soft and non-tender, non-distended. Skin: Warm and  dry.  No lesions in the area of chief complaint. Extremities: Warm and well perfused w/o edema Psychiatric: Patient is competent for consent with normal mood and affect  MUSCULOSKELETAL:  LLE with pain around hip area. She is NVI.  Other extremities are atraumatic with painless ROM and NVI.  Assessment / Plan: Active Problems:   * No active hospital problems. *   Plan for Operative fixation left hip hemiarthroplasty.  Scheduled for tomorrow morning please hold Eliquis.  -NPO  -Medicine team to admit and perform pre-op clearance -PT/OT post op -Foley okay prn for comfort  -Likely to require Rehab or SNF placement upon discharge.   The risks benefits and alternatives of the procedure were discussed with the patient including but not limited to infection, bleeding, nerve injury, the need for revision surgery, blood clots, cardiopulmonary complications, morbidity, mortality, among others.  The patient verbalizes understanding and wishes to proceed.    Weightbearing: Bedrest.  Will amend post op. VTE prophylaxis: Per primary team for now. Recommend lovenox or heparin if needed. Will return pt. Back to Eliquis post-op. SCDs.  Pain control: Oxycodone okay, IV dilaudid for breakthrough. Tylenol. Minimize narcotics if able. Follow-up plan: Will follow in acute inpatient post-op phase.  Plan for outpatient follow up with Dr. Alain Marion in about 2 weeks. Contact information:  Edmonia Lynch MD, Margy Clarks PA-C  Rachael Fee PA-C 12/02/2020 4:41 PM

## 2020-12-02 NOTE — H&P (Signed)
History and Physical    [name] [id number] DOB: [birth year] DOA: 12/02/2020  PCP: [name], MD  Patient coming from: Home  I have personally briefly reviewed patient's old medical records available.   Chief Complaint: Left hip pain after fall  HPI: [name] is a 87 y.o. female with medical history significant of paroxysmal A. 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  Since last 1 year, she had multiple issues and hospitalization, wrist fracture and rehab.  She was recently optimized to undergo reversal of colostomy but that was postponed.  Patient was living at home with children taking turns to watch her and also has caregiver in the daytime.  She gets around with a walker.  Today she was trying to reach to the closet to hang up a coat, felt somewhat dizzy and fell backwards striking the left side of her body.  Pain is severe 10 out of 10 and unable to move. Patient has been complaining of intermittent dizziness and was recently seen at primary care physician's office and reduced dose of Cardizem.  Unsure about orthostatic. Denies any chest pain or shortness of breath.  Denies any loss of consciousness.  Denies any recent congestive heart failure or coronary artery disease.  Had undergone multiple procedures since last 1 year. ED Course: Hemodynamically stable.  Left hip x-ray with neck of femur fracture.  Taken Eliquis today morning. Orthopedics consulted, planning for ORIF tomorrow morning.  Review of Systems: all systems are reviewed and pertinent positive as per HPI otherwise rest are negative.    Past Medical History:  Diagnosis Date  . DVT (deep venous thrombosis) ([place]) 09/2015   RLE  . GERD (gastroesophageal reflux disease)   . Hypertension   . Hypothyroidism   . Melanoma ([place])  06/16/2017   Facial melanoma, removed by Dr. Harvel Quale  . Peripheral vascular disease ([place])    peripheral neuropathy    Past Surgical History:  Procedure Laterality Date  . ABDOMINAL HYSTERECTOMY    . APPENDECTOMY    . BACK SURGERY    . BREAST SURGERY     left breast biopsy  . CARDIOVERSION N/A 05/09/2020   Procedure: CARDIOVERSION;  Surgeon: Skeet Latch, MD;  Location: [facility];  Service: Cardiovascular;  Laterality: N/A;  . COLOSTOMY N/A 01/06/2020   Procedure: Colostomy;  Surgeon: Greer Pickerel, MD;  Location: [facility];  Service: General;  Laterality: N/A;  . EYE SURGERY     cataract  . HARDWARE REMOVAL Left 01/01/2020   Procedure: HARDWARE REMOVAL;  Surgeon: Iran Planas, MD;  Location: [facility];  Service: Orthopedics;  Laterality: Left;  . LAPAROTOMY N/A 01/06/2020   Procedure: Exploratory Laparotomy, Open Sigmoid colectomy;  Surgeon: Greer Pickerel, MD;  Location: [facility];  Service: General;  Laterality: N/A;  . LIPOMA EXCISION Left 04/25/2015   Procedure: EXCISION OF LIPOMA LEFT ARM;  Surgeon: Donnie [place], MD;  Location: [facility];  Service: General;  Laterality: Left;  . LUMBAR LAMINECTOMY/DECOMPRESSION MICRODISCECTOMY  11/20/2011   Procedure: LUMBAR LAMINECTOMY/DECOMPRESSION MICRODISCECTOMY;  Surgeon: Jeneen Rinks P [place];  Location: [facility];  Service: Orthopedics;  Laterality: N/A;  Decompressive Laminectomy L2 to the Sacrum (X-Ray)  . OPEN REDUCTION INTERNAL FIXATION (ORIF) DISTAL RADIAL FRACTURE Left 01/01/2020   Procedure: OPEN REDUCTION INTERNAL FIXATION (ORIF) DISTAL RADIAL FRACTURE;  Surgeon: Iran Planas, MD;  Location: [facility];  Service: Orthopedics;  Laterality: Left;  . OTHER SURGICAL HISTORY     left wrist surgery - has plate in left wrist   . OTHER SURGICAL HISTORY     right knee surgery due to torn cartilage   . TONSILLECTOMY    . WRIST FRACTURE SURGERY Left     Social history   reports that she has never smoked. She has never used smokeless tobacco. She  reports that she does not drink alcohol and does not use drugs.  Allergies  Allergen Reactions  . Keflex [Cephalexin] Nausea Only and Other (See Comments)    Pt ended up in ER w/ CHEST PAIN  . Codeine Other (See Comments)    "Nightmares, imagined things"    Family History  Problem Relation Age of Onset  . Cancer Mother        BREAST  . COPD Father   . Emphysema Father   . Cancer Daughter        breast     Prior to Admission medications   Medication Sig Start Date End Date Taking? Authorizing Provider  acetaminophen (TYLENOL) 325 MG tablet Take 1-2 tablets (325-650 mg total) by mouth every 4 (four) hours as needed for mild pain. 01/19/20   Love, Ivan Anchors, PA-C  apixaban (ELIQUIS) 5 MG TABS tablet Take 1 tablet (5 mg total) by mouth 2 (two) times daily. 06/20/20   Deboraha Sprang, MD  diltiazem (CARDIZEM CD) 120 MG 24 hr capsule Take 2 capsules by mouth once daily 11/23/20   [name], MD  diltiazem (DILACOR XR) 120 MG 24 hr capsule Take 1 capsule (120 mg total) by mouth daily. 11/17/20 02/15/21  [name], MD  docusate sodium (COLACE) 100 MG capsule Take 1 capsule (100 mg total) by mouth 2 (two) times daily. 01/20/20   Love, Ivan Anchors, PA-C  gabapentin (NEURONTIN) 300 MG capsule Take 300 mg by mouth at bedtime.    [provider]  levothyroxine (SYNTHROID) 50 MCG tablet TAKE 1 TABLET BY MOUTH EVERY DAY Patient taking differently: Take 50 mcg by mouth daily before breakfast. 10/02/20   [name], MD  losartan (COZAAR) 50 MG tablet Take 1 tablet (50 mg total) by mouth daily. 10/16/20   [name], MD  Multiple Vitamins-Minerals (PRESERVISION AREDS 2 PO) Take 1 tablet by mouth daily at 12 noon. Patient not taking: Reported on 11/30/2020    [provider]  nadolol (CORGARD) 80 MG tablet TAKE 1 TABLET BY MOUTH EVERY DAY 10/27/20   [name], MD  pantoprazole (PROTONIX) 40 MG tablet Take 1 tablet (40 mg total) by mouth  daily. Patient taking differently: Take 40 mg by mouth 2 (two) times daily before a meal. 10/27/20   [name], MD  polyethylene glycol (MIRALAX / GLYCOLAX) 17 g packet Take 17 g by mouth in the morning, at noon, and at bedtime.    [provider]  predniSONE (DELTASONE) 5 MG tablet Take 5 mg by mouth daily with lunch.    [provider]  senna-docusate (SENOKOT-S) 8.6-50 MG tablet Take 1 tablet by mouth at bedtime. 01/20/20 01/19/21  Love, Ivan Anchors, PA-C  sucralfate (CARAFATE) 1 g tablet Take 1 tablet (1 g total) by mouth with breakfast, with lunch, and with evening meal. 11/30/20   [name], MD    Physical Exam: Vitals:   12/02/20 1330 12/02/20 1342 12/02/20 1543  BP: (!) 181/63  (!) 176/58  Pulse: 95  64  Resp: 15  16  Temp: 98 F (36.7 C)    TempSrc: Oral    SpO2: 98%  97%  Weight:  68.9 kg   Height:  5\' 7"  (1.702 m)     Constitutional: NAD, calm, comfortable Vitals:   12/02/20 1330 12/02/20 1342 12/02/20 1543  BP: (!) 181/63  (!) 176/58  Pulse: 95  64  Resp: 15  16  Temp: 98 F (36.7 C)    TempSrc: Oral    SpO2: 98%  97%  Weight:  68.9 kg   Height:  5\' 7"  (1.702 m)    Eyes: PERRL, lids and conjunctivae normal ENMT: Mucous membranes are moist. Posterior pharynx clear of any exudate or lesions.Normal dentition.  Neck: normal, supple, no masses, no thyromegaly Respiratory: clear to auscultation bilaterally, no wheezing, no crackles. Normal respiratory effort. No accessory muscle use.  Cardiovascular: Regular rate and rhythm, no murmurs / rubs / gallops. No extremity edema. 2+ pedal pulses. No carotid bruits.  Abdomen: no tenderness, no masses palpated. No hepatosplenomegaly. Bowel sounds positive.  Colostomy with loose stool  Musculoskeletal: no clubbing / cyanosis.  Left leg externally rotated  Distal NV status intact. Hip ROM not done with known fracture.  Skin: no rashes, lesions, ulcers. No induration Neurologic: CN 2-12 grossly  intact. Sensation intact, DTR normal. Strength 5/5 in all 4.  Psychiatric: Normal judgment and insight. Alert and oriented x 3. Normal mood.     Labs on Admission: I have personally reviewed following labs and imaging studies  CBC: Recent Labs  Lab 12/02/20 1420  WBC 10.6*  NEUTROABS 7.4  HGB 13.3  HCT 42.3  MCV 94.8  PLT 716   Basic Metabolic Panel: Recent Labs  Lab 11/29/20 1137 12/02/20 1420  NA 140 142  K 4.5 3.7  CL 102 107  CO2 25 26  GLUCOSE 91 100*  BUN 14 13  CREATININE 1.15* 0.92  CALCIUM 9.8 9.2   GFR: Estimated Creatinine Clearance: 35.6 mL/min (by C-G formula based on SCr of 0.92 mg/dL). Liver Function Tests: Recent Labs  Lab 12/02/20 1420  AST 20  ALT 16  ALKPHOS 83  BILITOT 0.6  PROT 6.2*  ALBUMIN 3.6   No results for input(s): LIPASE, AMYLASE in the last 168 hours. No results for input(s): AMMONIA in the last 168 hours. Coagulation Profile: Recent Labs  Lab 12/02/20 1420  INR 1.2   Cardiac Enzymes: No results for input(s): CKTOTAL, CKMB, CKMBINDEX, TROPONINI in the last 168 hours. BNP (last 3 results) No results for input(s): PROBNP in the last 8760 hours. HbA1C: No results for input(s): HGBA1C in the last 72 hours. CBG: No results for input(s): GLUCAP in the last 168 hours. Lipid Profile: No results for input(s): CHOL, HDL, LDLCALC, TRIG, CHOLHDL, LDLDIRECT in the last 72 hours. Thyroid Function Tests: No results for input(s): TSH, T4TOTAL, FREET4, T3FREE, THYROIDAB in the last 72 hours. Anemia Panel: No results for input(s): VITAMINB12, FOLATE, FERRITIN, TIBC, IRON, RETICCTPCT in the last 72 hours. Urine analysis:    Component Value Date/Time   COLORURINE YELLOW 05/21/2020 [place] 05/21/2020 0927   LABSPEC 1.009 05/21/2020 0927   PHURINE 6.0 05/21/2020 0927   GLUCOSEU NEGATIVE 05/21/2020 0927   GLUCOSEU NEGATIVE 02/22/2016 0926   HGBUR NEGATIVE 05/21/2020 0927   BILIRUBINUR NEGATIVE 05/21/2020 0927    BILIRUBINUR Negative 02/03/2020 1548   KETONESUR NEGATIVE 05/21/2020 0927   PROTEINUR NEGATIVE 05/21/2020 0927   UROBILINOGEN 0.2 02/03/2020 1548   UROBILINOGEN 0.2 02/22/2016 0926   NITRITE  NEGATIVE 05/21/2020 0927   LEUKOCYTESUR NEGATIVE 05/21/2020 [id number]    Radiological Exams on Admission: CT Head Wo Contrast  Result Date: 12/02/2020 CLINICAL DATA:  Dizziness, fall backwards EXAM: CT HEAD WITHOUT CONTRAST CT CERVICAL SPINE WITHOUT CONTRAST TECHNIQUE: Multidetector CT imaging of the head and cervical spine was performed following the standard protocol without intravenous contrast. Multiplanar CT image reconstructions of the cervical spine were also generated. COMPARISON:  CT head dated 05/14/2020 FINDINGS: CT HEAD FINDINGS Brain: No evidence of acute infarction, hemorrhage, hydrocephalus, extra-axial collection or mass lesion/mass effect. Mild age related atrophy. Subcortical white matter and periventricular small vessel ischemic changes. Vascular: Mild intracranial atherosclerosis. Skull: Normal. Negative for fracture or focal lesion. Sinuses/Orbits: The visualized paranasal sinuses are essentially clear. The mastoid air cells are unopacified. Other: None. CT CERVICAL SPINE FINDINGS Alignment: Mild straightening of the cervical spine, likely positional. Skull base and vertebrae: No acute fracture. No primary bone lesion or focal pathologic process. Soft tissues and spinal canal: No prevertebral fluid or swelling. No visible canal hematoma. Disc levels: Mild degenerative changes of the mid cervical spine. Spinal canal is patent. Upper chest: Visualized lung apices are notable for mild biapical pleural-parenchymal scarring. Other: Visualized thyroid is unremarkable. IMPRESSION: No evidence of acute intracranial abnormality. Age related atrophy with small vessel ischemic changes. No evidence of traumatic injury to the cervical spine. Mild degenerative changes. Electronically Signed   By: Julian Hy  M.D.   On: 12/02/2020 15:33   CT Cervical Spine Wo Contrast  Result Date: 12/02/2020 CLINICAL DATA:  Dizziness, fall backwards EXAM: CT HEAD WITHOUT CONTRAST CT CERVICAL SPINE WITHOUT CONTRAST TECHNIQUE: Multidetector CT imaging of the head and cervical spine was performed following the standard protocol without intravenous contrast. Multiplanar CT image reconstructions of the cervical spine were also generated. COMPARISON:  CT head dated 05/14/2020 FINDINGS: CT HEAD FINDINGS Brain: No evidence of acute infarction, hemorrhage, hydrocephalus, extra-axial collection or mass lesion/mass effect. Mild age related atrophy. Subcortical white matter and periventricular small vessel ischemic changes. Vascular: Mild intracranial atherosclerosis. Skull: Normal. Negative for fracture or focal lesion. Sinuses/Orbits: The visualized paranasal sinuses are essentially clear. The mastoid air cells are unopacified. Other: None. CT CERVICAL SPINE FINDINGS Alignment: Mild straightening of the cervical spine, likely positional. Skull base and vertebrae: No acute fracture. No primary bone lesion or focal pathologic process. Soft tissues and spinal canal: No prevertebral fluid or swelling. No visible canal hematoma. Disc levels: Mild degenerative changes of the mid cervical spine. Spinal canal is patent. Upper chest: Visualized lung apices are notable for mild biapical pleural-parenchymal scarring. Other: Visualized thyroid is unremarkable. IMPRESSION: No evidence of acute intracranial abnormality. Age related atrophy with small vessel ischemic changes. No evidence of traumatic injury to the cervical spine. Mild degenerative changes. Electronically Signed   By: Julian Hy M.D.   On: 12/02/2020 15:33   DG HIP UNILAT WITH PELVIS 2-3 VIEWS LEFT  Result Date: 12/02/2020 CLINICAL DATA:  Fall, left hip pain EXAM: DG HIP (WITH OR WITHOUT PELVIS) 2-3V LEFT COMPARISON:  05/19/2020 FINDINGS: Acute fracture of the left femoral neck  with slight impaction and minimal varus angulation. No dislocation. Mild degenerative changes of the bilateral hips. Bones are demineralized. Bony pelvis intact. Degenerative disc disease of the lower lumbar spine. IMPRESSION: Acute fracture of the left femoral neck with slight impaction and minimal varus angulation. Electronically Signed   By: Davina Poke D.O.   On: 12/02/2020 15:11    EKG: Independently reviewed.  Normal sinus rhythm.  Left anterior  fascicular block.Her EKG is comparable to previous EKGs.   Assessment/Plan Principal Problem:   Hip fracture (HCC) Active Problems:   Hypothyroidism   HTN (hypertension)   Persistent atrial fibrillation (HCC)   Colostomy status ([place])     1.  Left hip fracture: Admit Bedrest until surgery Hip fracture precautions, Foley catheter if needed Pain management with oral opiates and IV Dilaudid Regular diet and n.p.o. past midnight for surgery tomorrow. Postop management as per surgery. SCDs until surgery.  2. preoperative medical evaluation: Patient with no active ischemia, no recent MI, no evidence of congestive heart failure.  She has chronic medical issues but they are fairly stable.  Patient can go to above-mentioned hip surgery without further invasive cardiac testing with acceptable risk as benefits of surgical fixation outweigh any risk of surgery.  3. paroxysmal A. fib: Currently in sinus rhythm.  On reduced dose of Cardizem and nadolol that we will continue. Last dose of Eliquis 1/21 morning.  Hold until surgery. Patient has been recently complaining of dizziness, will check orthostatic after surgical fixation. Will hold ACE inhibitors.  4. hypothyroidism: Clinically euthyroid.  Continue thyroxine.  5.  Colostomy status: Continue colostomy care.  Followed by surgery outpatient for possible closure.  6. Chronic steroid therapy for polymyelgia: continue prednisone 5 mg daily. anesthesia to give stress dose during  surgery.   DVT prophylaxis: Eliquis on hold.  SCD. Code Status: Full code Family Communication: Patient's son at the bedside Disposition Plan: Skilled nursing facility Consults called: Orthopedics, Dr. Percell Miller Admission status: Inpatient.  MedSurg preoperative.  Patient may need telemetry monitor after surgery.   Barb Merino MD [facility] Pager [phone]

## 2020-12-03 ENCOUNTER — Inpatient Hospital Stay (HOSPITAL_COMMUNITY): Payer: Medicare PPO | Admitting: Certified Registered Nurse Anesthetist

## 2020-12-03 ENCOUNTER — Encounter (HOSPITAL_COMMUNITY)
Admission: EM | Disposition: A | Discharge status: Skilled Nursing Facility | Payer: Self-pay | Source: Home / Self Care | Attending: Internal Medicine

## 2020-12-03 DIAGNOSIS — I480 Paroxysmal atrial fibrillation: Secondary | ICD-10-CM

## 2020-12-03 HISTORY — PX: HIP ARTHROPLASTY: SHX981

## 2020-12-03 LAB — SARS CORONAVIRUS 2 (TAT 6-24 HRS): SARS Coronavirus 2: NEGATIVE

## 2020-12-03 LAB — MRSA PCR SCREENING: MRSA by PCR: NEGATIVE

## 2020-12-03 SURGERY — HEMIARTHROPLASTY, HIP, DIRECT ANTERIOR APPROACH, FOR FRACTURE
Anesthesia: General | Site: Hip | Laterality: Left

## 2020-12-03 MED ORDER — BISACODYL 10 MG RE SUPP: 10.0000 mg | Freq: Every day | RECTAL | Status: DC | PRN

## 2020-12-03 MED ORDER — CEFAZOLIN SODIUM-DEXTROSE 2-4 GM/100ML-% IV SOLN
2.0000 g | Freq: Four times a day (QID) | INTRAVENOUS | Status: AC
  Administered 2020-12-03 – 2020-12-04 (×3): 2 g via INTRAVENOUS
  Filled 2020-12-03 (×3): qty 100

## 2020-12-03 MED ORDER — ROCURONIUM BROMIDE 10 MG/ML (PF) SYRINGE
PREFILLED_SYRINGE | INTRAVENOUS | Status: DC | PRN
  Administered 2020-12-03: 40 mg via INTRAVENOUS

## 2020-12-03 MED ORDER — PHENYLEPHRINE HCL-NACL 10-0.9 MG/250ML-% IV SOLN
INTRAVENOUS | Status: DC | PRN
  Administered 2020-12-03: 25 ug/min via INTRAVENOUS

## 2020-12-03 MED ORDER — ONDANSETRON HCL 4 MG PO TABS: 4.0000 mg | ORAL_TABLET | Freq: Four times a day (QID) | ORAL | Status: DC | PRN

## 2020-12-03 MED ORDER — BUPIVACAINE LIPOSOME 1.3 % IJ SUSP
20.0000 mL | Freq: Once | INTRAMUSCULAR | Status: DC
  Filled 2020-12-03: qty 20

## 2020-12-03 MED ORDER — MORPHINE SULFATE (PF) 2 MG/ML IV SOLN: 0.5000 mg | INTRAVENOUS | Status: DC | PRN

## 2020-12-03 MED ORDER — METOPROLOL TARTRATE 5 MG/5ML IV SOLN
INTRAVENOUS | Status: DC | PRN
  Administered 2020-12-03: 1 mg via INTRAVENOUS

## 2020-12-03 MED ORDER — TRANEXAMIC ACID-NACL 1000-0.7 MG/100ML-% IV SOLN
1000.0000 mg | Freq: Once | INTRAVENOUS | Status: DC
  Filled 2020-12-03 (×2): qty 100

## 2020-12-03 MED ORDER — ONDANSETRON HCL 4 MG/2ML IJ SOLN
4.0000 mg | Freq: Four times a day (QID) | INTRAMUSCULAR | Status: DC | PRN
  Administered 2020-12-07: 4 mg via INTRAVENOUS
  Filled 2020-12-03: qty 2

## 2020-12-03 MED ORDER — SUGAMMADEX SODIUM 200 MG/2ML IV SOLN
INTRAVENOUS | Status: DC | PRN
  Administered 2020-12-03: 150 mg via INTRAVENOUS

## 2020-12-03 MED ORDER — LACTATED RINGERS IV SOLN: INTRAVENOUS | Status: DC

## 2020-12-03 MED ORDER — CHLORHEXIDINE GLUCONATE 0.12 % MT SOLN: 15.0000 mL | Freq: Once | OROMUCOSAL | Status: AC

## 2020-12-03 MED ORDER — METOCLOPRAMIDE HCL 5 MG/ML IJ SOLN: 5.0000 mg | Freq: Three times a day (TID) | INTRAMUSCULAR | Status: DC | PRN

## 2020-12-03 MED ORDER — SENNOSIDES-DOCUSATE SODIUM 8.6-50 MG PO TABS: 1.0000 | ORAL_TABLET | Freq: Every evening | ORAL | Status: DC | PRN

## 2020-12-03 MED ORDER — FENTANYL CITRATE (PF) 250 MCG/5ML IJ SOLN
INTRAMUSCULAR | Status: DC | PRN
  Administered 2020-12-03 (×2): 50 ug via INTRAVENOUS

## 2020-12-03 MED ORDER — CHLORHEXIDINE GLUCONATE 0.12 % MT SOLN
OROMUCOSAL | Status: AC
  Administered 2020-12-03: 15 mL via OROMUCOSAL
  Filled 2020-12-03: qty 15

## 2020-12-03 MED ORDER — DEXAMETHASONE SODIUM PHOSPHATE 10 MG/ML IJ SOLN
INTRAMUSCULAR | Status: DC | PRN
  Administered 2020-12-03: 5 mg via INTRAVENOUS

## 2020-12-03 MED ORDER — FENTANYL CITRATE (PF) 100 MCG/2ML IJ SOLN
25.0000 ug | INTRAMUSCULAR | Status: DC | PRN
  Administered 2020-12-03: 25 ug via INTRAVENOUS

## 2020-12-03 MED ORDER — APIXABAN 5 MG PO TABS
5.0000 mg | ORAL_TABLET | Freq: Two times a day (BID) | ORAL | Status: DC
  Administered 2020-12-03 – 2020-12-07 (×8): 5 mg via ORAL
  Filled 2020-12-03 (×8): qty 1

## 2020-12-03 MED ORDER — TRANEXAMIC ACID-NACL 1000-0.7 MG/100ML-% IV SOLN
INTRAVENOUS | Status: AC
  Filled 2020-12-03: qty 100

## 2020-12-03 MED ORDER — FENTANYL CITRATE (PF) 100 MCG/2ML IJ SOLN
INTRAMUSCULAR | Status: AC
  Filled 2020-12-03: qty 2

## 2020-12-03 MED ORDER — TRANEXAMIC ACID-NACL 1000-0.7 MG/100ML-% IV SOLN
INTRAVENOUS | Status: DC | PRN
  Administered 2020-12-03: 1000 mg via INTRAVENOUS

## 2020-12-03 MED ORDER — FENTANYL CITRATE (PF) 250 MCG/5ML IJ SOLN
INTRAMUSCULAR | Status: AC
  Filled 2020-12-03: qty 5

## 2020-12-03 MED ORDER — 0.9 % SODIUM CHLORIDE (POUR BTL) OPTIME
TOPICAL | Status: DC | PRN
  Administered 2020-12-03: 1000 mL

## 2020-12-03 MED ORDER — TRAMADOL HCL 50 MG PO TABS
50.0000 mg | ORAL_TABLET | Freq: Four times a day (QID) | ORAL | Status: DC
Start: 2020-12-03 — End: 2020-12-07
  Administered 2020-12-03 – 2020-12-07 (×16): 50 mg via ORAL
  Filled 2020-12-03 (×17): qty 1

## 2020-12-03 MED ORDER — ACETAMINOPHEN 500 MG PO TABS
ORAL_TABLET | ORAL | Status: AC
  Administered 2020-12-03: 1000 mg via ORAL
  Filled 2020-12-03: qty 2

## 2020-12-03 MED ORDER — ONDANSETRON HCL 4 MG/2ML IJ SOLN
INTRAMUSCULAR | Status: DC | PRN
  Administered 2020-12-03: 4 mg via INTRAVENOUS

## 2020-12-03 MED ORDER — PROPOFOL 10 MG/ML IV BOLUS
INTRAVENOUS | Status: DC | PRN
  Administered 2020-12-03: 90 mg via INTRAVENOUS

## 2020-12-03 MED ORDER — METOCLOPRAMIDE HCL 5 MG PO TABS: 5.0000 mg | ORAL_TABLET | Freq: Three times a day (TID) | ORAL | Status: DC | PRN

## 2020-12-03 MED ORDER — PHENYLEPHRINE HCL (PRESSORS) 10 MG/ML IV SOLN
INTRAVENOUS | Status: DC | PRN
  Administered 2020-12-03: 120 ug via INTRAVENOUS

## 2020-12-03 MED ORDER — ACETAMINOPHEN 500 MG PO TABS: 1000.0000 mg | ORAL_TABLET | Freq: Once | ORAL | Status: AC

## 2020-12-03 MED ORDER — PROPOFOL 10 MG/ML IV BOLUS
INTRAVENOUS | Status: AC
  Filled 2020-12-03: qty 20

## 2020-12-03 MED ORDER — LIDOCAINE 2% (20 MG/ML) 5 ML SYRINGE
INTRAMUSCULAR | Status: DC | PRN
  Administered 2020-12-03: 50 mg via INTRAVENOUS

## 2020-12-03 MED ORDER — HYDROCODONE-ACETAMINOPHEN 5-325 MG PO TABS: 1.0000 | ORAL_TABLET | ORAL | Status: DC | PRN

## 2020-12-03 MED ORDER — AMISULPRIDE (ANTIEMETIC) 5 MG/2ML IV SOLN
INTRAVENOUS | Status: AC
  Filled 2020-12-03: qty 4

## 2020-12-03 MED ORDER — SODIUM CHLORIDE 0.9 % IV SOLN
INTRAVENOUS | Status: DC | PRN
  Administered 2020-12-03: 40 mL

## 2020-12-03 MED ORDER — ALBUMIN HUMAN 5 % IV SOLN: INTRAVENOUS | Status: DC | PRN

## 2020-12-03 MED ORDER — ORAL CARE MOUTH RINSE: 15.0000 mL | Freq: Once | OROMUCOSAL | Status: AC

## 2020-12-03 MED ORDER — HYDROCODONE-ACETAMINOPHEN 7.5-325 MG PO TABS: 1.0000 | ORAL_TABLET | ORAL | Status: DC | PRN

## 2020-12-03 MED ORDER — AMISULPRIDE (ANTIEMETIC) 5 MG/2ML IV SOLN
10.0000 mg | Freq: Once | INTRAVENOUS | Status: AC | PRN
  Administered 2020-12-03: 10 mg via INTRAVENOUS

## 2020-12-03 SURGICAL SUPPLY — 62 items
APL PRP STRL LF DISP 70% ISPRP (MISCELLANEOUS) ×2
BLADE SAGITTAL 25.0X1.27X90 (BLADE) ×2 IMPLANT
CHLORAPREP W/TINT 26 (MISCELLANEOUS) ×4 IMPLANT
CLSR STERI-STRIP ANTIMIC 1/2X4 (GAUZE/BANDAGES/DRESSINGS) ×2 IMPLANT
COVER SURGICAL LIGHT HANDLE (MISCELLANEOUS) ×2 IMPLANT
COVER WAND RF STERILE (DRAPES) IMPLANT
DRAPE INCISE IOBAN 66X45 STRL (DRAPES) ×2 IMPLANT
DRAPE ORTHO SPLIT 77X108 STRL (DRAPES) ×4
DRAPE SURG ORHT 6 SPLT 77X108 (DRAPES) ×2 IMPLANT
DRAPE U-SHAPE 47X51 STRL (DRAPES) ×2 IMPLANT
DRSG MEPILEX BORDER 4X8 (GAUZE/BANDAGES/DRESSINGS) ×1 IMPLANT
ELECT BLADE 4.0 EZ CLEAN MEGAD (MISCELLANEOUS) ×2
ELECT CAUTERY BLADE 6.4 (BLADE) ×2 IMPLANT
ELECT REM PT RETURN 9FT ADLT (ELECTROSURGICAL) ×2
ELECTRODE BLDE 4.0 EZ CLN MEGD (MISCELLANEOUS) IMPLANT
ELECTRODE REM PT RTRN 9FT ADLT (ELECTROSURGICAL) ×1 IMPLANT
GLOVE BIO SURGEON STRL SZ 6.5 (GLOVE) ×3 IMPLANT
GLOVE BIO SURGEON STRL SZ7.5 (GLOVE) ×2 IMPLANT
GLOVE BIO SURGEON STRL SZ8 (GLOVE) ×5 IMPLANT
GLOVE SRG 8 PF TXTR STRL LF DI (GLOVE) ×1 IMPLANT
GLOVE SURG UNDER LTX SZ6.5 (GLOVE) ×2 IMPLANT
GLOVE SURG UNDER POLY LF SZ8 (GLOVE) ×2
GOWN STRL REUS W/ TWL LRG LVL3 (GOWN DISPOSABLE) ×2 IMPLANT
GOWN STRL REUS W/ TWL XL LVL3 (GOWN DISPOSABLE) ×2 IMPLANT
GOWN STRL REUS W/TWL 2XL LVL3 (GOWN DISPOSABLE) IMPLANT
GOWN STRL REUS W/TWL LRG LVL3 (GOWN DISPOSABLE) ×4
GOWN STRL REUS W/TWL XL LVL3 (GOWN DISPOSABLE) ×4
HEAD MODULAR ENDO (Orthopedic Implant) ×2 IMPLANT
HEAD UNPLR 47XMDLR STRL HIP (Orthopedic Implant) IMPLANT
KIT BASIN OR (CUSTOM PROCEDURE TRAY) ×2 IMPLANT
KIT TURNOVER KIT B (KITS) ×2 IMPLANT
MANIFOLD NEPTUNE II (INSTRUMENTS) ×2 IMPLANT
NDL MAYO TROCAR (NEEDLE) IMPLANT
NDL SPNL 18GX3.5 QUINCKE PK (NEEDLE) IMPLANT
NEEDLE MAYO TROCAR (NEEDLE) IMPLANT
NEEDLE SPNL 18GX3.5 QUINCKE PK (NEEDLE) ×2 IMPLANT
PACK TOTAL JOINT (CUSTOM PROCEDURE TRAY) ×2 IMPLANT
PAD ARMBOARD 7.5X6 YLW CONV (MISCELLANEOUS) ×4 IMPLANT
PILLOW ABDUCTION MEDIUM (MISCELLANEOUS) ×2 IMPLANT
RETRIEVER SUT HEWSON (MISCELLANEOUS) ×2 IMPLANT
SLEEVE UNITRAX (Orthopedic Implant) ×1 IMPLANT
STAPLER VISISTAT 35W (STAPLE) ×1 IMPLANT
STEM 37MM HIP (Hips) ×1 IMPLANT
SUT FIBERWIRE #2 38 REV NDL BL (SUTURE) ×8
SUT MNCRL AB 3-0 PS2 18 (SUTURE) ×1 IMPLANT
SUT MNCRL AB 4-0 PS2 18 (SUTURE) ×1 IMPLANT
SUT MON AB 2-0 CT1 36 (SUTURE) ×1 IMPLANT
SUT MON AB 3-0 SH 27 (SUTURE)
SUT MON AB 3-0 SH27 (SUTURE) IMPLANT
SUT VIC AB 0 CT1 27 (SUTURE) ×4
SUT VIC AB 0 CT1 27XBRD ANBCTR (SUTURE) ×2 IMPLANT
SUT VIC AB 1 CT1 27 (SUTURE) ×2
SUT VIC AB 1 CT1 27XBRD ANBCTR (SUTURE) IMPLANT
SUT VIC AB 2-0 CT1 27 (SUTURE) ×4
SUT VIC AB 2-0 CT1 TAPERPNT 27 (SUTURE) ×2 IMPLANT
SUT VIC AB 2-0 SH 27 (SUTURE)
SUT VIC AB 2-0 SH 27XBRD (SUTURE) IMPLANT
SUTURE FIBERWR#2 38 REV NDL BL (SUTURE) ×2 IMPLANT
SYR 20ML LL LF (SYRINGE) ×1 IMPLANT
SYR 30ML SLIP (SYRINGE) ×1 IMPLANT
TRAY FOLEY W/BAG SLVR 14FR (SET/KITS/TRAYS/PACK) ×1 IMPLANT
YANKAUER SUCT BULB TIP NO VENT (SUCTIONS) ×1 IMPLANT

## 2020-12-03 NOTE — Transfer of Care (Signed)
Immediate Anesthesia Transfer of Care Note  Patient: [name]  Procedure(s) Performed: ARTHROPLASTY BIPOLAR HIP (HEMIARTHROPLASTY) (Left Hip)  Patient Location: PACU  Anesthesia Type:General  Level of Consciousness: drowsy  Airway & Oxygen Therapy: Patient Spontanous Breathing  Post-op Assessment: Report given to RN and Post -op Vital signs reviewed and stable  Post vital signs: Reviewed and stable  Last Vitals:  Vitals Value Taken Time  BP 156/73 12/03/20 0955  Temp    Pulse 79 12/03/20 0955  Resp 31 12/03/20 0955  SpO2 97 % 12/03/20 0955  Vitals shown include unvalidated device data.  Last Pain:  Vitals:   12/03/20 0515  TempSrc: Oral  PainSc:          Complications: No complications documented.

## 2020-12-03 NOTE — Anesthesia Procedure Notes (Signed)
Procedure Name: Intubation Date/Time: 12/03/2020 7:50 AM Performed by: Clearnce Sorrel, CRNA Pre-anesthesia Checklist: Patient identified, Emergency Drugs available, Suction available, Patient being monitored and Timeout performed Patient Re-evaluated:Patient Re-evaluated prior to induction Oxygen Delivery Method: Circle system utilized Preoxygenation: Pre-oxygenation with 100% oxygen Induction Type: IV induction Ventilation: Mask ventilation without difficulty Laryngoscope Size: Mac and 3 Grade View: Grade I Tube type: Oral Tube size: 7.0 mm Number of attempts: 1 Airway Equipment and Method: Stylet Placement Confirmation: ETT inserted through vocal cords under direct vision,  positive ETCO2 and breath sounds checked- equal and bilateral Secured at: 22 cm Tube secured with: Tape Dental Injury: Teeth and Oropharynx as per pre-operative assessment

## 2020-12-03 NOTE — Op Note (Signed)
12/02/2020 - 12/03/2020  8:50 AM  PATIENT:  [name]   MRN: [id number]  PRE-OPERATIVE DIAGNOSIS:  left hip fracture  POST-OPERATIVE DIAGNOSIS:  left hip fracture  PROCEDURE:  Procedure(s): ARTHROPLASTY BIPOLAR HIP (HEMIARTHROPLASTY)  PREOPERATIVE INDICATIONS:  [name] is an 87 y.o. female who was admitted 12/02/2020 with a diagnosis of Hip fracture ([place]) and elected for surgical management.  The risks benefits and alternatives were discussed with the patient including but not limited to the risks of nonoperative treatment, versus surgical intervention including infection, bleeding, nerve injury, periprosthetic fracture, the need for revision surgery, dislocation, leg length discrepancy, blood clots, cardiopulmonary complications, morbidity, mortality, among others, and they were willing to proceed.  Predicted outcome is good, although there will be at least a six to nine month expected recovery.   OPERATIVE REPORT     SURGEON:  Edmonia Lynch, MD    ASSISTANT:  Margy Clarks, PA-C, he was present and scrubbed throughout the case, critical for completion in a timely fashion, and for retraction, instrumentation, and closure.     ANESTHESIA:  General    COMPLICATIONS:  None.      COMPONENTS:  [employer] Acolade: Femoral stem: 7, Femoral Head:47, Neck:-4   PROCEDURE IN DETAIL: The patient was met in the holding area and identified.  The appropriate hip  was marked at the operative site. The patient was then transported to the OR and  placed under general anesthesia.  At that point, the patient was  placed in the lateral decubitus position with the operative side up and  secured to the operating room table and all bony prominences padded.     The operative lower extremity was prepped from the iliac crest to the toes.  Sterile draping was performed.  Time out was performed prior to incision.      A routine posterolateral approach was utilized via sharp dissection  carried down  to the subcutaneous tissue.  Gross bleeders were Bovie  coagulated.  The iliotibial band was identified and incised  along the length of the skin incision.  Self-retaining retractors were  inserted. I examined the bursa there was significant hematoma and edema I performed a bursectomy here.  With the hip internally rotated, the short external rotators  were identified. The piriformis was tagged with FiberWire, and the hip capsule released in a T-type fashion.  The femoral neck was exposed, and I resected the femoral neck using the appropriate jig. This was performed at approximately a thumb's breadth above the lesser trochanter.    I then exposed the deep acetabulum, cleared out any tissue including the ligamentum teres, and included the hip capsule in the FiberWire used above and below the T.    I then prepared the proximal femur using the cookie-cutter, the lateralizing reamer, and then sequentially broached.  A trial utilized, and I reduced the hip and it was found to have excellent stability with functional range of motion. The trial components were then removed.   The canal and acetabulum were thoroughly irrigated  I inserted the pressfit stem and placed the head and neck collar. The hip was reduced with appropriate force and was stable through a range of motion.   I then used a 2 mm drill bits to pass the FiberWire suture from the capsule and puriform is through the greater trochanter, and secured this. Excellent posterior capsular repair was achieved. I also closed the T in the capsule.  I then irrigated the hip copiously again with pulse lavage,  and repaired the fascia with Vicryl, followed by Vicryl for the subcutaneous tissue, Monocryl for the skin, Steri-Strips and sterile gauze. The wounds were injected. The patient was then awakened and returned to PACU in stable and satisfactory condition. There were no complications.  POST-OP PLAN: Weight bearing as tolerated. DVT px will consist of  SCD's and chemical px  Edmonia Lynch, MD Orthopedic Surgeon [phone]   12/03/2020 8:50 AM

## 2020-12-03 NOTE — Progress Notes (Signed)
Pt states this morning she not going to go to surgery until her family gets here and she speaks to the doctor

## 2020-12-03 NOTE — Anesthesia Preprocedure Evaluation (Addendum)
Anesthesia Evaluation  Patient identified by MRN, date of birth, ID band Patient awake    Reviewed: Allergy & Precautions, NPO status , Patient's Chart, lab work & pertinent test results  Airway Mallampati: II  TM Distance: >3 FB     Dental  (+) Dental Advisory Given   Pulmonary neg pulmonary ROS,    breath sounds clear to auscultation       Cardiovascular hypertension, Pt. on medications + Peripheral Vascular Disease   Rhythm:Regular Rate:Normal     Neuro/Psych negative neurological ROS     GI/Hepatic Neg liver ROS, GERD  ,  Endo/Other  Hypothyroidism   Renal/GU negative Renal ROS     Musculoskeletal  (+) Arthritis ,   Abdominal   Peds  Hematology negative hematology ROS (+)   Anesthesia Other Findings   Reproductive/Obstetrics                             Anesthesia Physical Anesthesia Plan  ASA: III  Anesthesia Plan: General   Post-op Pain Management:    Induction: Intravenous  PONV Risk Score and Plan: 3 and Ondansetron, Dexamethasone and Treatment may vary due to age or medical condition  Airway Management Planned: Oral ETT  Additional Equipment: None  Intra-op Plan:   Post-operative Plan: Extubation in OR  Informed Consent: I have reviewed the patients History and Physical, chart, labs and discussed the procedure including the risks, benefits and alternatives for the proposed anesthesia with the patient or authorized representative who has indicated his/her understanding and acceptance.     Dental advisory given  Plan Discussed with: CRNA  Anesthesia Plan Comments:         Anesthesia Quick Evaluation

## 2020-12-03 NOTE — Progress Notes (Signed)
PROGRESS NOTE    [name]  [id number] DOB: [birth year] DOA: 12/02/2020 PCP: [name], MD    Chief Complaint  Patient presents with  . Fall  . Hip Pain    Left    Brief Narrative:  [name] is a 87 y.o. female with medical history significant of paroxysmal A. 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  Since last 1 year, she had multiple issues and hospitalization, wrist fracture and rehab.  She was recently optimized to undergo reversal of colostomy but that was postponed.  Patient was living at home with children taking turns to watch her and also has caregiver in the daytime.  She gets around with a walker.  Today she was trying to reach to the closet to hang up a coat, felt somewhat dizzy and fell backwards striking the left side of her body.  Pain is severe 10 out of 10 and unable to move. Patient has been complaining of intermittent dizziness and was recently seen at primary care physician's office and reduced dose of Cardizem.  Unsure about orthostatic. Denies any chest pain or shortness of breath.  Denies any loss of consciousness.  Denies any recent congestive heart failure or coronary artery disease.  Had undergone multiple procedures since last 1 year. ED Course: Hemodynamically stable.  Left hip x-ray with neck of femur fracture.  Taken Eliquis today morning. Orthopedics consulted, planning for ORIF tomorrow morning.  Subjective:  Returned from OR, pain is reasonably controlled   Assessment & Plan:   Principal Problem:   Hip fracture ([place]) Active Problems:   Hypothyroidism   HTN (hypertension)   Persistent atrial fibrillation (H[facility])   Colostomy status ([place])  left hip fracture -s/p left HEMIARTHROPLASTY by Dr Percell Miller -wound care,  pain management, postop DVT prophylaxis per  Ortho -Appreciate Ortho input   PAF -Currently sinus rhythm -Blood pressure 133/74 today - on Cardizem and nadolol at home which is continued  -resume eliquis when ok with ortho  Hypertension -On Cardizem, nadolol and losartan at home -Reported dizziness prior to admission, will check orthostatic vital signs with physical therapy, hold losartan for now Continue cardizem and nadolol  PMR Chronic prednisone 5 mg daily Anesthesia gave stress dose steroid given surgery  Hypothyroidism  continue Synthroid  Colostomy status Continue colostomy care Followed by surgery outpatient for possible colostomy reversal   DVT prophylaxis: SCDs Start: 12/02/20 2159   Code Status:full Family Communication: patient Disposition:   Status is: Inpatient   Dispo: The patient is from: home              Anticipated d/c is to: TBD              Anticipated d/c date is: 24-48hrs, need ortho clearance                 Consultants:   ortho  Procedures:  -s/p left HEMIARTHROPLASTY by Dr Percell Miller  Antimicrobials:   Perioperative     Objective: Vitals:   12/03/20 1030 12/03/20 1040 12/03/20 1055 12/03/20 1123  BP: (!) 175/77 (!) 176/77 (!) 167/74 (!) 164/76  Pulse: 62 61 67 64  Resp: 14 16 14 16   Temp:   97.7 F (36.5 C) 97.6 F (36.4 C)  TempSrc:    Oral  SpO2: 97% 98% 96% 96%  Weight:      Height:  Intake/Output Summary (Last 24 hours) at 12/03/2020 1235 Last data filed at 12/03/2020 1100 Gross per 24 hour  Intake 1250 ml  Output 300 ml  Net 950 ml   Filed Weights   12/02/20 1342  Weight: 68.9 kg    Examination:  General exam: calm, NAD, appear younger than stated age Respiratory system: Clear to auscultation. Respiratory effort normal. Cardiovascular system: S1 & S2 heard, RRR.  No pedal edema. Gastrointestinal system: Abdomen is nondistended, soft and nontender.  Normal bowel sounds heard.  Positive colostomy Central nervous system: Alert and oriented. No  focal neurological deficits. Extremities: Left hip postop changes Skin: No rashes, lesions or ulcers Psychiatry: Judgement and insight appear normal. Mood & affect appropriate.     Data Reviewed: I have personally reviewed following labs and imaging studies  CBC: Recent Labs  Lab 12/02/20 1420  WBC 10.6*  NEUTROABS 7.4  HGB 13.3  HCT 42.3  MCV 94.8  PLT 0000000    Basic Metabolic Panel: Recent Labs  Lab 11/29/20 1137 12/02/20 1420  NA 140 142  K 4.5 3.7  CL 102 107  CO2 25 26  GLUCOSE 91 100*  BUN 14 13  CREATININE 1.15* 0.92  CALCIUM 9.8 9.2    GFR: Estimated Creatinine Clearance: 35.6 mL/min (by C-G formula based on SCr of 0.92 mg/dL).  Liver Function Tests: Recent Labs  Lab 12/02/20 1420  AST 20  ALT 16  ALKPHOS 83  BILITOT 0.6  PROT 6.2*  ALBUMIN 3.6    CBG: No results for input(s): GLUCAP in the last 168 hours.   Recent Results (from the past 240 hour(s))  SARS CORONAVIRUS 2 (TAT 6-24 HRS) Nasopharyngeal Nasopharyngeal Swab     Status: None   Collection Time: 12/02/20  8:38 PM   Specimen: Nasopharyngeal Swab  Result Value Ref Range Status   SARS Coronavirus 2 NEGATIVE NEGATIVE Final    Comment: (NOTE) SARS-CoV-2 target nucleic acids are NOT DETECTED.  The SARS-CoV-2 RNA is generally detectable in upper and lower respiratory specimens during the acute phase of infection. Negative results do not preclude SARS-CoV-2 infection, do not rule out co-infections with other pathogens, and should not be used as the sole basis for treatment or other patient management decisions. Negative results must be combined with clinical observations, patient history, and epidemiological information. The expected result is Negative.  Fact Sheet for Patients: SugarRoll.be  Fact Sheet for Healthcare Providers: https://www.woods-mathews.com/  This test is not yet approved or cleared by the [place] FDA and  has been  authorized for detection and/or diagnosis of SARS-CoV-2 by FDA under an Emergency Use Authorization (EUA). This EUA will remain  in effect (meaning this test can be used) for the duration of the COVID-19 declaration under Se ction 564(b)(1) of the Act, 21 U.S.C. section 360bbb-3(b)(1), unless the authorization is terminated or revoked sooner.  Performed at [facility], [place] [address], [place], [place] [address]   MRSA PCR Screening     Status: None   Collection Time: 12/02/20 11:47 PM   Specimen: Nasal Mucosa; Nasopharyngeal  Result Value Ref Range Status   MRSA by PCR NEGATIVE NEGATIVE Final    Comment:        The GeneXpert MRSA Assay (FDA approved for NASAL specimens only), is one component of a comprehensive MRSA colonization surveillance program. It is not intended to diagnose MRSA infection nor to guide or monitor treatment for MRSA infections. Performed at [facility], [place] [address], [place], [place]  [address]          Radiology Studies: CT Head Wo Contrast  Result Date: 12/02/2020 CLINICAL DATA:  Dizziness, fall backwards EXAM: CT HEAD WITHOUT CONTRAST CT CERVICAL SPINE WITHOUT CONTRAST TECHNIQUE: Multidetector CT imaging of the head and cervical spine was performed following the standard protocol without intravenous contrast. Multiplanar CT image reconstructions of the cervical spine were also generated. COMPARISON:  CT head dated 05/14/2020 FINDINGS: CT HEAD FINDINGS Brain: No evidence of acute infarction, hemorrhage, hydrocephalus, extra-axial collection or mass lesion/mass effect. Mild age related atrophy. Subcortical white matter and periventricular small vessel ischemic changes. Vascular: Mild intracranial atherosclerosis. Skull: Normal. Negative for fracture or focal lesion. Sinuses/Orbits: The visualized paranasal sinuses are essentially clear. The mastoid air cells are unopacified. Other: None. CT CERVICAL SPINE FINDINGS Alignment: Mild straightening  of the cervical spine, likely positional. Skull base and vertebrae: No acute fracture. No primary bone lesion or focal pathologic process. Soft tissues and spinal canal: No prevertebral fluid or swelling. No visible canal hematoma. Disc levels: Mild degenerative changes of the mid cervical spine. Spinal canal is patent. Upper chest: Visualized lung apices are notable for mild biapical pleural-parenchymal scarring. Other: Visualized thyroid is unremarkable. IMPRESSION: No evidence of acute intracranial abnormality. Age related atrophy with small vessel ischemic changes. No evidence of traumatic injury to the cervical spine. Mild degenerative changes. Electronically Signed   By: Julian Hy M.D.   On: 12/02/2020 15:33   CT Cervical Spine Wo Contrast  Result Date: 12/02/2020 CLINICAL DATA:  Dizziness, fall backwards EXAM: CT HEAD WITHOUT CONTRAST CT CERVICAL SPINE WITHOUT CONTRAST TECHNIQUE: Multidetector CT imaging of the head and cervical spine was performed following the standard protocol without intravenous contrast. Multiplanar CT image reconstructions of the cervical spine were also generated. COMPARISON:  CT head dated 05/14/2020 FINDINGS: CT HEAD FINDINGS Brain: No evidence of acute infarction, hemorrhage, hydrocephalus, extra-axial collection or mass lesion/mass effect. Mild age related atrophy. Subcortical white matter and periventricular small vessel ischemic changes. Vascular: Mild intracranial atherosclerosis. Skull: Normal. Negative for fracture or focal lesion. Sinuses/Orbits: The visualized paranasal sinuses are essentially clear. The mastoid air cells are unopacified. Other: None. CT CERVICAL SPINE FINDINGS Alignment: Mild straightening of the cervical spine, likely positional. Skull base and vertebrae: No acute fracture. No primary bone lesion or focal pathologic process. Soft tissues and spinal canal: No prevertebral fluid or swelling. No visible canal hematoma. Disc levels: Mild  degenerative changes of the mid cervical spine. Spinal canal is patent. Upper chest: Visualized lung apices are notable for mild biapical pleural-parenchymal scarring. Other: Visualized thyroid is unremarkable. IMPRESSION: No evidence of acute intracranial abnormality. Age related atrophy with small vessel ischemic changes. No evidence of traumatic injury to the cervical spine. Mild degenerative changes. Electronically Signed   By: Julian Hy M.D.   On: 12/02/2020 15:33   DG HIP UNILAT WITH PELVIS 2-3 VIEWS LEFT  Result Date: 12/02/2020 CLINICAL DATA:  Fall, left hip pain EXAM: DG HIP (WITH OR WITHOUT PELVIS) 2-3V LEFT COMPARISON:  05/19/2020 FINDINGS: Acute fracture of the left femoral neck with slight impaction and minimal varus angulation. No dislocation. Mild degenerative changes of the bilateral hips. Bones are demineralized. Bony pelvis intact. Degenerative disc disease of the lower lumbar spine. IMPRESSION: Acute fracture of the left femoral neck with slight impaction and minimal varus angulation. Electronically Signed   By: Davina Poke D.O.   On: 12/02/2020 15:11        Scheduled Meds: . amisulpride      .  bupivacaine liposome  20 mL Infiltration Once  . diltiazem  120 mg Oral Daily  . docusate sodium  100 mg Oral BID  . fentaNYL      . gabapentin  300 mg Oral QHS  . levothyroxine  50 mcg Oral QAC breakfast  . nadolol  80 mg Oral Daily  . pantoprazole  40 mg Oral BID AC  . polyethylene glycol  17 g Oral Daily  . predniSONE  5 mg Oral Q lunch  . senna-docusate  1 tablet Oral QHS  . sucralfate  1 g Oral TID with meals  . traMADol  50 mg Oral Q6H   Continuous Infusions: .  ceFAZolin (ANCEF) IV    . tranexamic acid       LOS: 1 day   Time spent: 56mins Greater than 50% of this time was spent in counseling, explanation of diagnosis, planning of further management, and coordination of care.  I have personally reviewed and interpreted on  12/03/2020 daily labs, I  reviewed all nursing notes, pharmacy notes, consultant notes,  vitals, pertinent old records  I have discussed plan of care as described above with RN , patient on 12/03/2020  Voice Recognition /Dragon dictation system was used to create this note, attempts have been made to correct errors. Please contact the author with questions and/or clarifications.   Florencia Reasons, MD PhD FACP [facility]  Available via Epic secure chat 7am-7pm for nonurgent issues Please page for urgent issues To page the attending provider between 7A-7P or the covering provider during after hours 7P-7A, please log into the web site [email or web address] and access using universal [place] password for that web site. If you do not have the password, please call the hospital operator.    12/03/2020, 12:35 PM

## 2020-12-03 NOTE — Plan of Care (Signed)
  Problem: Activity: °Goal: Ability to avoid complications of mobility impairment will improve °Outcome: Progressing °  °Problem: Education: °Goal: Verbalization of understanding the information provided will improve °Outcome: Progressing °  °Problem: Coping: °Goal: Level of anxiety will decrease °Outcome: Progressing °  °Problem: Physical Regulation: °Goal: Postoperative complications will be avoided or minimized °Outcome: Progressing °  °Problem: Respiratory: °Goal: Ability to maintain a clear airway will improve °Outcome: Progressing °  °Problem: Pain Management: °Goal: Pain level will decrease °Outcome: Progressing °  °Problem: Skin Integrity: °Goal: Signs of wound healing will improve °Outcome: Progressing °  °

## 2020-12-03 NOTE — Interval H&P Note (Signed)
I participated in the care of this patient and agree with the above history, physical and evaluation. I performed a review of the history and a physical exam as detailed   Gaberiel Youngblood Daniel Gaines Cartmell MD  

## 2020-12-03 NOTE — Anesthesia Postprocedure Evaluation (Signed)
Anesthesia Post Note  Patient: [name]  Procedure(s) Performed: ARTHROPLASTY BIPOLAR HIP (HEMIARTHROPLASTY) (Left Hip)     Patient location during evaluation: PACU Anesthesia Type: General Level of consciousness: awake and alert Pain management: pain level controlled Vital Signs Assessment: post-procedure vital signs reviewed and stable Respiratory status: spontaneous breathing, nonlabored ventilation, respiratory function stable and patient connected to nasal cannula oxygen Cardiovascular status: blood pressure returned to baseline and stable Postop Assessment: no apparent nausea or vomiting Anesthetic complications: no   No complications documented.  Last Vitals:  Vitals:   12/03/20 1235 12/03/20 1354  BP: 133/74 138/62  Pulse: 66 70  Resp: 18 16  Temp: 36.7 C 36.9 C  SpO2: 98% 98%    Last Pain:  Vitals:   12/03/20 1354  TempSrc: Oral  PainSc: 0-No pain                 Tiajuana Amass

## 2020-12-04 ENCOUNTER — Inpatient Hospital Stay (HOSPITAL_COMMUNITY): Payer: Medicare PPO

## 2020-12-04 ENCOUNTER — Encounter (HOSPITAL_COMMUNITY): Payer: Self-pay | Admitting: Orthopedic Surgery

## 2020-12-04 ENCOUNTER — Other Ambulatory Visit: Payer: Self-pay

## 2020-12-04 DIAGNOSIS — N289 Disorder of kidney and ureter, unspecified: Secondary | ICD-10-CM

## 2020-12-04 LAB — CBC
HCT: 35.1 % — ABNORMAL LOW (ref 36.0–46.0)
Hemoglobin: 11.6 g/dL — ABNORMAL LOW (ref 12.0–15.0)
MCH: 30.4 pg (ref 26.0–34.0)
MCHC: 33 g/dL (ref 30.0–36.0)
MCV: 92.1 fL (ref 80.0–100.0)
Platelets: 235 10*3/uL (ref 150–400)
RBC: 3.81 MIL/uL — ABNORMAL LOW (ref 3.87–5.11)
RDW: 14.9 % (ref 11.5–15.5)
WBC: 16.7 10*3/uL — ABNORMAL HIGH (ref 4.0–10.5)
nRBC: 0 % (ref 0.0–0.2)

## 2020-12-04 LAB — BASIC METABOLIC PANEL
Anion gap: 11 (ref 5–15)
BUN: 16 mg/dL (ref 8–23)
CO2: 24 mmol/L (ref 22–32)
Calcium: 8.7 mg/dL — ABNORMAL LOW (ref 8.9–10.3)
Chloride: 102 mmol/L (ref 98–111)
Creatinine, Ser: 0.93 mg/dL (ref 0.44–1.00)
GFR, Estimated: 57 mL/min — ABNORMAL LOW (ref >60)
Glucose, Bld: 155 mg/dL — ABNORMAL HIGH (ref 70–99)
Potassium: 3.7 mmol/L (ref 3.5–5.1)
Sodium: 137 mmol/L (ref 135–145)

## 2020-12-04 LAB — MAGNESIUM: Magnesium: 1.8 mg/dL (ref 1.7–2.4)

## 2020-12-04 NOTE — Progress Notes (Signed)
Per Dr Caryl Comes have pt repeat BMET in 2 weeks.  Results have been released to MyChart and pt to schedule appointment.

## 2020-12-04 NOTE — Progress Notes (Signed)
PROGRESS NOTE    [name]  [id number] DOB: [birth year] DOA: 12/02/2020 PCP: [name], MD   Chief Complaint  Patient presents with  . Fall  . Hip Pain    Left   Brief Narrative: 87 year old female with PAF on Eliquis, polymyalgia on chronic prednisone, GERD, hypertension, hypothyroidism, ruptured diverticulitis with colostomy presented after a fall and found to have left hip fracture.  She underwent ORIF by Dr Percell Miller 12/03/20  Subjective: Seen this morning son is at the bedside.  She has not gotten up has not had PT evaluate. Willing to go to CIR or SNF, has been to [facility] before as per the son. Lives alone.   Assessment & Plan:  Left Hip fracture s/p ORIF by Dr Percell Miller 12/03/20.  Pending PT OT evaluation.  Continue pain control, continue Eliquis for DVT prophylaxis which is her home meds.  Leukocytosis noted today but afebrile, likely postop/reactive.  Monitor CBC in a.m. Recent Labs  Lab 12/02/20 1420 12/04/20 0500  WBC 10.6* 16.7*   PAF currently in sinus rhythm, on Cardizem, nadolol.  Continue Eliquis post op  Hypertension blood pressure is controlled continue home Cardizem nadolol.  Losartan on hold.  Hypothyroidism: Continue home Synthroid  PMR on chronic prednisone 5 mg, continue the same.  Status post stress test  By anesthesia  Colostomy status continue routine colostomy care.  Nutrition: Diet Order            Diet regular Room service appropriate? Yes; Fluid consistency: Thin  Diet effective now                  Body mass index is 23.81 kg/m.  DVT prophylaxis: SCDs Start: 12/02/20 2159 Code Status:   Code Status: Full Code  Family Communication: plan of care discussed with patient at bedside.  Status is: Inpatient Remains inpatient appropriate because:Unsafe d/c plan and Inpatient level of care appropriate due to severity of illness  Dispo: The patient is from: Home              Anticipated d/c is to: CAR versus skilled  nursing facility              Anticipated d/c date is: 2 days              Patient currently is not medically stable to d/c.   Difficult to place patient No   Consultants:see note  Procedures:see note  Culture/Microbiology    Component Value Date/Time   SDES  05/21/2020 0927    Urine Performed at [facility], [place] [address], [place], [place] [address]    SPECREQUEST  05/21/2020 [id number]    NONE Performed at [facility], [place] [address], [place], [place] [address]    CULT  05/21/2020 [id number]    NO GROWTH Performed at [facility], [place] [address] [place], [place] [address]    REPTSTATUS 05/22/2020 FINAL 05/21/2020 [id number]    Other culture-see note  Medications: Scheduled Meds: . apixaban  5 mg Oral BID  . bupivacaine liposome  20 mL Infiltration Once  . diltiazem  120 mg Oral Daily  . docusate sodium  100 mg Oral BID  . gabapentin  300 mg Oral QHS  . levothyroxine  50 mcg Oral QAC breakfast  . nadolol  80 mg Oral Daily  . pantoprazole  40 mg Oral BID AC  . polyethylene glycol  17 g Oral Daily  . predniSONE  5 mg Oral Q  lunch  . senna-docusate  1 tablet Oral QHS  . sucralfate  1 g Oral TID with meals  . traMADol  50 mg Oral Q6H   Continuous Infusions: . tranexamic acid      Antimicrobials: Anti-infectives (From admission, onward)   Start     Dose/Rate Route Frequency Ordered Stop   12/03/20 1400  ceFAZolin (ANCEF) IVPB 2g/100 mL premix        2 g 200 mL/hr over 30 Minutes Intravenous Every 6 hours 12/03/20 1118 12/04/20 0300   12/03/20 0600  ceFAZolin (ANCEF) IVPB 2g/100 mL premix        2 g 200 mL/hr over 30 Minutes Intravenous On call to O.R. 12/02/20 2335 12/03/20 0800     Objective: Vitals: Today's Vitals   12/03/20 2100 12/04/20 0017 12/04/20 0601 12/04/20 0737  BP:  (!) 146/82 (!) 173/96   Pulse:  75 73   Resp:  18 18   Temp:  98.4 F (36.9 C) 98.5 F (36.9 C)   TempSrc:  Oral Oral   SpO2:  94% 99%   Weight:       Height:      PainSc: 0-No pain   0-No pain    Intake/Output Summary (Last 24 hours) at 12/04/2020 0829 Last data filed at 12/04/2020 0500 Gross per 24 hour  Intake 1740 ml  Output 700 ml  Net 1040 ml   Filed Weights   12/02/20 1342  Weight: 68.9 kg   Weight change:   Intake/Output from previous day: 01/23 0701 - 01/24 0700 In: 1740 [P.O.:390; I.V.:1000; IV Piggyback:350] Out: 700 [Urine:400; Blood:300] Intake/Output this shift: No intake/output data recorded. Filed Weights   12/02/20 1342  Weight: 68.9 kg    Examination: General exam: AAOx3 ,NAD, weak appearing. HEENT:Oral mucosa moist, Ear/Nose WNL grossly,dentition normal. Respiratory system: bilaterally clear,no wheezing or crackles,no use of accessory muscle, non tender. Cardiovascular system: S1 & S2 +, regular, No JVD. Gastrointestinal system: Abdomen soft, NT,ND, BS+. Nervous System:Alert, awake, moving extremities and grossly nonfocal Extremities: No edema, distal peripheral pulses palpable.  Left hip surgical site with dressing intact. Skin: No rashes,no icterus. MSK: Normal muscle bulk,tone, power   Data Reviewed: I have personally reviewed following labs and imaging studies CBC: Recent Labs  Lab 12/02/20 1420 12/04/20 0500  WBC 10.6* 16.7*  NEUTROABS 7.4  --   HGB 13.3 11.6*  HCT 42.3 35.1*  MCV 94.8 92.1  PLT 261 711   Basic Metabolic Panel: Recent Labs  Lab 11/29/20 1137 12/02/20 1420 12/04/20 0500  NA 140 142 137  K 4.5 3.7 3.7  CL 102 107 102  CO2 25 26 24   GLUCOSE 91 100* 155*  BUN 14 13 16   CREATININE 1.15* 0.92 0.93  CALCIUM 9.8 9.2 8.7*  MG  --   --  1.8   GFR: Estimated Creatinine Clearance: 35.2 mL/min (by C-G formula based on SCr of 0.93 mg/dL). Liver Function Tests: Recent Labs  Lab 12/02/20 1420  AST 20  ALT 16  ALKPHOS 83  BILITOT 0.6  PROT 6.2*  ALBUMIN 3.6   No results for input(s): LIPASE, AMYLASE in the last 168 hours. No results for input(s): AMMONIA  in the last 168 hours. Coagulation Profile: Recent Labs  Lab 12/02/20 1420  INR 1.2   Cardiac Enzymes: No results for input(s): CKTOTAL, CKMB, CKMBINDEX, TROPONINI in the last 168 hours. BNP (last 3 results) No results for input(s): PROBNP in the last 8760 hours. HbA1C: No results for input(s): HGBA1C in the  last 72 hours. CBG: No results for input(s): GLUCAP in the last 168 hours. Lipid Profile: No results for input(s): CHOL, HDL, LDLCALC, TRIG, CHOLHDL, LDLDIRECT in the last 72 hours. Thyroid Function Tests: No results for input(s): TSH, T4TOTAL, FREET4, T3FREE, THYROIDAB in the last 72 hours. Anemia Panel: No results for input(s): VITAMINB12, FOLATE, FERRITIN, TIBC, IRON, RETICCTPCT in the last 72 hours. Sepsis Labs: No results for input(s): PROCALCITON, LATICACIDVEN in the last 168 hours.  Recent Results (from the past 240 hour(s))  SARS CORONAVIRUS 2 (TAT 6-24 HRS) Nasopharyngeal Nasopharyngeal Swab     Status: None   Collection Time: 12/02/20  8:38 PM   Specimen: Nasopharyngeal Swab  Result Value Ref Range Status   SARS Coronavirus 2 NEGATIVE NEGATIVE Final    Comment: (NOTE) SARS-CoV-2 target nucleic acids are NOT DETECTED.  The SARS-CoV-2 RNA is generally detectable in upper and lower respiratory specimens during the acute phase of infection. Negative results do not preclude SARS-CoV-2 infection, do not rule out co-infections with other pathogens, and should not be used as the sole basis for treatment or other patient management decisions. Negative results must be combined with clinical observations, patient history, and epidemiological information. The expected result is Negative.  Fact Sheet for Patients: SugarRoll.be  Fact Sheet for Healthcare Providers: https://www.woods-mathews.com/  This test is not yet approved or cleared by the [place] FDA and  has been authorized for detection and/or diagnosis of  SARS-CoV-2 by FDA under an Emergency Use Authorization (EUA). This EUA will remain  in effect (meaning this test can be used) for the duration of the COVID-19 declaration under Se ction 564(b)(1) of the Act, 21 U.S.C. section 360bbb-3(b)(1), unless the authorization is terminated or revoked sooner.  Performed at [facility], [place] [address] [place], [place] [address]   MRSA PCR Screening     Status: None   Collection Time: 12/02/20 11:47 PM   Specimen: Nasal Mucosa; Nasopharyngeal  Result Value Ref Range Status   MRSA by PCR NEGATIVE NEGATIVE Final    Comment:        The GeneXpert MRSA Assay (FDA approved for NASAL specimens only), is one component of a comprehensive MRSA colonization surveillance program. It is not intended to diagnose MRSA infection nor to guide or monitor treatment for MRSA infections. Performed at [facility], [place] [address], [place],  [address]      Radiology Studies: CT Head Wo Contrast  Result Date: 12/02/2020 CLINICAL DATA:  Dizziness, fall backwards EXAM: CT HEAD WITHOUT CONTRAST CT CERVICAL SPINE WITHOUT CONTRAST TECHNIQUE: Multidetector CT imaging of the head and cervical spine was performed following the standard protocol without intravenous contrast. Multiplanar CT image reconstructions of the cervical spine were also generated. COMPARISON:  CT head dated 05/14/2020 FINDINGS: CT HEAD FINDINGS Brain: No evidence of acute infarction, hemorrhage, hydrocephalus, extra-axial collection or mass lesion/mass effect. Mild age related atrophy. Subcortical white matter and periventricular small vessel ischemic changes. Vascular: Mild intracranial atherosclerosis. Skull: Normal. Negative for fracture or focal lesion. Sinuses/Orbits: The visualized paranasal sinuses are essentially clear. The mastoid air cells are unopacified. Other: None. CT CERVICAL SPINE FINDINGS Alignment: Mild straightening of the cervical spine, likely positional. Skull base  and vertebrae: No acute fracture. No primary bone lesion or focal pathologic process. Soft tissues and spinal canal: No prevertebral fluid or swelling. No visible canal hematoma. Disc levels: Mild degenerative changes of the mid cervical spine. Spinal canal is patent. Upper chest: Visualized lung apices are notable for mild  biapical pleural-parenchymal scarring. Other: Visualized thyroid is unremarkable. IMPRESSION: No evidence of acute intracranial abnormality. Age related atrophy with small vessel ischemic changes. No evidence of traumatic injury to the cervical spine. Mild degenerative changes. Electronically Signed   By: Julian Hy M.D.   On: 12/02/2020 15:33   CT Cervical Spine Wo Contrast  Result Date: 12/02/2020 CLINICAL DATA:  Dizziness, fall backwards EXAM: CT HEAD WITHOUT CONTRAST CT CERVICAL SPINE WITHOUT CONTRAST TECHNIQUE: Multidetector CT imaging of the head and cervical spine was performed following the standard protocol without intravenous contrast. Multiplanar CT image reconstructions of the cervical spine were also generated. COMPARISON:  CT head dated 05/14/2020 FINDINGS: CT HEAD FINDINGS Brain: No evidence of acute infarction, hemorrhage, hydrocephalus, extra-axial collection or mass lesion/mass effect. Mild age related atrophy. Subcortical white matter and periventricular small vessel ischemic changes. Vascular: Mild intracranial atherosclerosis. Skull: Normal. Negative for fracture or focal lesion. Sinuses/Orbits: The visualized paranasal sinuses are essentially clear. The mastoid air cells are unopacified. Other: None. CT CERVICAL SPINE FINDINGS Alignment: Mild straightening of the cervical spine, likely positional. Skull base and vertebrae: No acute fracture. No primary bone lesion or focal pathologic process. Soft tissues and spinal canal: No prevertebral fluid or swelling. No visible canal hematoma. Disc levels: Mild degenerative changes of the mid cervical spine. Spinal canal is  patent. Upper chest: Visualized lung apices are notable for mild biapical pleural-parenchymal scarring. Other: Visualized thyroid is unremarkable. IMPRESSION: No evidence of acute intracranial abnormality. Age related atrophy with small vessel ischemic changes. No evidence of traumatic injury to the cervical spine. Mild degenerative changes. Electronically Signed   By: Julian Hy M.D.   On: 12/02/2020 15:33   DG HIP UNILAT WITH PELVIS 2-3 VIEWS LEFT  Result Date: 12/02/2020 CLINICAL DATA:  Fall, left hip pain EXAM: DG HIP (WITH OR WITHOUT PELVIS) 2-3V LEFT COMPARISON:  05/19/2020 FINDINGS: Acute fracture of the left femoral neck with slight impaction and minimal varus angulation. No dislocation. Mild degenerative changes of the bilateral hips. Bones are demineralized. Bony pelvis intact. Degenerative disc disease of the lower lumbar spine. IMPRESSION: Acute fracture of the left femoral neck with slight impaction and minimal varus angulation. Electronically Signed   By: Davina Poke D.O.   On: 12/02/2020 15:11     LOS: 2 days   Antonieta Pert, MD [facility]  12/04/2020, 8:29 AM

## 2020-12-04 NOTE — Progress Notes (Signed)
    Subjective: Patient reports pain as moderate.   Tolerating diet.  No CP, SOB.   No weakness/dizziness.  Not yet mobilized.  Objective:   VITALS:   Vitals:   12/03/20 1825 12/03/20 2024 12/04/20 0017 12/04/20 0601  BP: 128/73 (!) 141/76 (!) 146/82 (!) 173/96  Pulse: 78 77 75 73  Resp: 15 18 18 18   Temp: 98.4 F (36.9 C) 98.7 F (37.1 C) 98.4 F (36.9 C) 98.5 F (36.9 C)  TempSrc: Oral Oral Oral Oral  SpO2: 95% 93% 94% 99%  Weight:      Height:       CBC Latest Ref Rng & Units 12/04/2020 12/02/2020 11/23/2020  WBC 4.0 - 10.5 K/uL 16.7(H) 10.6(H) 8.6  Hemoglobin 12.0 - 15.0 g/dL 11.6(L) 13.3 13.3  Hematocrit 36.0 - 46.0 % 35.1(L) 42.3 40.9  Platelets 150 - 400 K/uL 235 261 301   BMP Latest Ref Rng & Units 12/04/2020 12/02/2020 11/29/2020  Glucose 70 - 99 mg/dL 155(H) 100(H) 91  BUN 8 - 23 mg/dL 16 13 14   Creatinine 0.44 - 1.00 mg/dL 0.93 0.92 1.15(H)  BUN/Creat Ratio 12 - 28 - - 12  Sodium 135 - 145 mmol/L 137 142 140  Potassium 3.5 - 5.1 mmol/L 3.7 3.7 4.5  Chloride 98 - 111 mmol/L 102 107 102  CO2 22 - 32 mmol/L 24 26 25   Calcium 8.9 - 10.3 mg/dL 8.7(L) 9.2 9.8   Intake/Output      01/23 0701 01/24 0700 01/24 0701 01/25 0700   P.O. 390    I.V. (mL/kg) 1000 (14.5)    IV Piggyback 350    Total Intake(mL/kg) 1740 (25.3)    Urine (mL/kg/hr) 400 (0.2)    Blood 300    Total Output 700    Net +1040         Urine Occurrence 1 x        Physical Exam: General: NAD.  Resting in bed.  Calm, conversant.  No increased work of breathing. MSK Neurovascularly intact Sensation intact distally Intact pulses distally Dorsiflexion/Plantar flexion intact Incision: dressing C/D/I   Assessment: 1 Day Post-Op  S/P Procedure(s) (LRB): ARTHROPLASTY BIPOLAR HIP (HEMIARTHROPLASTY) (Left) by Dr. Ernesta Amble. Percell Miller on 12/03/20  Principal Problem:   Hip fracture ([place]) Active Problems:   Hypothyroidism   HTN (hypertension)   Persistent atrial fibrillation ([facility])   Colostomy  status ([place])     Closed left femoral neck fracture, status post Hemiarthroplasty Doing well postop day 1 Tolerating diet and voiding Pain controlled Not yet mobilized  Plan: Up with therapy Incentive Spirometry Apply ice PRN  Weightbearing: WBAT LLE. Posterior Hip Precautions. Insicional and dressing care: Dressings left intact until follow-up Showering: Keep dressing dry VTE prophylaxis: Pt. own eliquis , SCDs, ambulation Pain control: Minimize narcotics.  Continue current regimen. Follow - up plan: 2 weeks Contact information:  Edmonia Lynch MD, Margy Clarks PA-C  Dispo: TBD.  Therapy evaluations pending.  Discharge when mobilized and ready medically.  Rachael Fee, PA-C 12/04/2020, 7:57 AM

## 2020-12-04 NOTE — Discharge Instructions (Signed)

## 2020-12-04 NOTE — Evaluation (Signed)
Occupational Therapy Evaluation Patient Details Name: [name] MRN: [id number] DOB: [birth year] Today's Date: 12/04/2020    History of Present Illness Pt is a 87 y.o. female admitted 12/02/20 after fall sustaining L femoral neck fx. S/p L hip hemiarthroplasty (posterior approach) 1/23. PMH includes DVT, HTN, PVD, back sx, colostomy, wrist fx   Clinical Impression   Pt presents with decline in function and safety with ADLs and ADL mobility with impaired strength, balance and endurance. Pt lives at home alone Ind, uses RW with children nearby checking on her regularly and a PCA 4 days/wk x 6 hrs for home mgt. Pt currently requird min A with mobility using RW and max - total A with LB ADLs/selfcare. Pt would benefit from acute OT services to address impairments to maximize level of function and safety    Follow Up Recommendations  SNF    Equipment Recommendations  None recommended by OT    Recommendations for Other Services       Precautions / Restrictions Precautions Precautions: Posterior Hip;Fall Precaution Booklet Issued: No Restrictions Weight Bearing Restrictions: Yes LLE Weight Bearing: Weight bearing as tolerated      Mobility Bed Mobility Overal bed mobility: Needs Assistance Bed Mobility: Supine to Sit     Supine to sit: Min assist     General bed mobility comments: MinA for RLE management and trunk elevation; cues to maintain posterior hip precautions, especially not flexing hips >90'    Transfers Overall transfer level: Needs assistance Equipment used: Rolling walker (2 wheeled) Transfers: Sit to/from Stand Sit to Stand: Min assist         General transfer comment: Multiple sit<>stands from EOB and BSC to RW with minA for trunk elevation and stability; cues for technique while maintaining posterior hip precautions (i.e. not flexing forward to stand)    Balance Overall balance assessment: Needs assistance   Sitting balance-Leahy Scale: Fair      Standing balance support: Bilateral upper extremity supported;During functional activity Standing balance-Leahy Scale: Poor Standing balance comment: Reliant on UE support                           ADL either performed or assessed with clinical judgement   ADL Overall ADL's : Needs assistance/impaired Eating/Feeding: Independent;Set up;Sitting   Grooming: Wash/dry hands;Wash/dry face;Min guard;Sitting   Upper Body Bathing: Min guard;Sitting   Lower Body Bathing: Maximal assistance   Upper Body Dressing : Min guard;Sitting   Lower Body Dressing: Total assistance   Toilet Transfer: Minimal assistance;RW;Stand-pivot;BSC;Cueing for safety   Toileting- Clothing Manipulation and Hygiene: Maximal assistance;Sit to/from stand       Functional mobility during ADLs: Minimal assistance;Rolling walker       Vision Baseline Vision/History: Wears glasses Patient Visual Report: No change from baseline       Perception     Praxis      Pertinent Vitals/Pain Pain Assessment: Faces Faces Pain Scale: Hurts little more Pain Location: LLE Pain Descriptors / Indicators: Discomfort;Grimacing;Guarding Pain Intervention(s): Limited activity within patient's tolerance;Patient requesting pain meds-RN notified;Repositioned;Monitored during session     Hand Dominance Right   Extremity/Trunk Assessment Upper Extremity Assessment Upper Extremity Assessment: Generalized weakness   Lower Extremity Assessment Lower Extremity Assessment: Defer to PT evaluation RLE Deficits / Details: s/p R THA with post-op pain and weakness; anke and knee flex/ext at least 3/5       Communication Communication Communication: No difficulties   Cognition Arousal/Alertness: Awake/alert Behavior During Therapy:  WFL for tasks assessed/performed Overall Cognitive Status: Within Functional Limits for tasks assessed                                 General Comments: WFL for simple  tasks; not formally assessed   General Comments  Son Gershon Mussel) present and supportive; pt plans to d/c to SNF before returning home    Exercises General Exercises - Lower Extremity Ankle Circles/Pumps: AROM;Both;Seated Long Arc Quad: AROM;Both;Seated   Shoulder Instructions      Home Living Family/patient expects to be discharged to:: Private residence Living Arrangements: Alone Available Help at Discharge: Family;Personal care attendant;Available PRN/intermittently Type of Home: House Home Access: Stairs to enter [employer] of Steps: 2 Entrance Stairs-Rails: None Home Layout: One level     Bathroom Shower/Tub: Occupational psychologist: Handicapped height     Home Equipment: Environmental consultant - 2 wheels;Grab bars - tub/shower;Bedside commode;Cane - single point   Additional Comments: Son and daughter live next door. Pt has PCA 4x/wk from 9a-3p      Prior Functioning/Environment Level of Independence: Needs assistance  Gait / Transfers Assistance Needed: Mod indep ambulating with RW ADL's / Homemaking Assistance Needed: Pt reports independent with ADLs, including colostomy bag management; does not get into shower            OT Problem List: Decreased strength;Decreased activity tolerance;Decreased knowledge of use of DME or AE;Impaired balance (sitting and/or standing);Pain      OT Treatment/Interventions: Self-care/ADL training;Therapeutic exercise;Balance training;Patient/family education;DME and/or AE instruction;Therapeutic activities    OT Goals(Current goals can be found in the care plan section) Acute Rehab OT Goals Patient Stated Goal: Wants to do post-acute rehab at SNF before return home OT Goal Formulation: With patient Time For Goal Achievement: 12/18/20 Potential to Achieve Goals: Good ADL Goals Pt Will Perform Grooming: with min guard assist;standing Pt Will Perform Upper Body Bathing: with supervision;with set-up;sitting Pt Will Perform Lower  Body Bathing: with mod assist;with adaptive equipment;sitting/lateral leans Pt Will Perform Upper Body Dressing: with supervision;with set-up;sitting Pt Will Transfer to Toilet: with min guard assist;ambulating;regular height toilet;bedside commode;grab bars Pt Will Perform Toileting - Clothing Manipulation and hygiene: with mod assist;with min assist;sit to/from stand  OT Frequency: Min 2X/week   Barriers to D/C:            Co-evaluation PT/OT/SLP Co-Evaluation/Treatment: Yes Reason for Co-Treatment: Other (comment);For patient/therapist safety;To address functional/ADL transfers PT goals addressed during session: Mobility/safety with mobility;Balance;Proper use of DME OT goals addressed during session: ADL's and self-care;Proper use of Adaptive equipment and DME      AM-PAC OT "6 Clicks" Daily Activity     Outcome Measure Help from another person eating meals?: None Help from another person taking care of personal grooming?: A Little Help from another person toileting, which includes using toliet, bedpan, or urinal?: A Lot Help from another person bathing (including washing, rinsing, drying)?: A Lot Help from another person to put on and taking off regular upper body clothing?: A Little Help from another person to put on and taking off regular lower body clothing?: Total 6 Click Score: 15   End of Session Equipment Utilized During Treatment: Gait belt;Rolling walker;Other (comment) [facility]) Nurse Communication: Mobility status  Activity Tolerance: Patient tolerated treatment well Patient left: in chair;with call bell/phone within reach;with chair alarm set;with family/visitor present  OT Visit Diagnosis: Unsteadiness on feet (R26.81);Other abnormalities of gait and mobility (R26.89);History of falling (  Z91.81);Muscle weakness (generalized) (M62.81);Pain Pain - Right/Left: Left Pain - part of body: Leg                Time: 1101-1134 OT Time Calculation (min): 33 min Charges:  OT  General Charges $OT Visit: 1 Visit OT Evaluation $OT Eval Moderate Complexity: 1 Mod    Britt Bottom 12/04/2020, 1:02 PM

## 2020-12-04 NOTE — Progress Notes (Signed)
Physical Therapy Treatment Patient Details Name: [name] MRN: [id number] DOB: [birth year] Today's Date: 12/04/2020    History of Present Illness Pt is a 87 y.o. female admitted 12/02/20 after fall sustaining L femoral neck fx. S/p L hip hemiarthroplasty (posterior approach) 1/23. PMH includes DVT, HTN, PVD, back sx, colostomy.   PT Comments    Pt seen to provide posterior hip precaution handout, urgency needing to use bathroom. Pt tolerated additional session for transfer and gait training with RW and minA; frequent cues to maintain posterior hip precautions. Pt remains limited by pain, generalized weakness, decreased activity tolerance and impaired balance. Continue to recommend post-acute rehab to maximize functional mobility and independence prior to return home; pt in agreement.    Follow Up Recommendations  SNF;Supervision for mobility/OOB     Equipment Recommendations  None recommended by PT    Recommendations for Other Services       Precautions / Restrictions Precautions Precautions: Posterior Hip;Fall;Other (comment) Precaution Booklet Issued: Yes (comment) Precaution Comments: Colostomy bag Restrictions Weight Bearing Restrictions: Yes LLE Weight Bearing: Weight bearing as tolerated    Mobility  Bed Mobility Overal bed mobility: Needs Assistance Bed Mobility: Sit to Supine     Supine to sit: Min assist Sit to supine: Min assist   General bed mobility comments: Pt cued to assist RLE with LLE, difficulty doing so and maintaining balance, requiring minA for RLE management; cues for posterior hip precautions while seated EOB  Transfers Overall transfer level: Needs assistance Equipment used: Rolling walker (2 wheeled) Transfers: Sit to/from Stand Sit to Stand: Min assist         General transfer comment: Multiple sit<>stands from EOB and BSC to RW with minA for trunk elevation and stability; cues for technique while maintaining posterior hip precautions  (i.e. not flexing forward to stand)  Ambulation/Gait Ambulation/Gait assistance: Min assist Gait Distance (Feet): 18 Feet Assistive device: Rolling walker (2 wheeled) Gait Pattern/deviations: Step-to pattern;Decreased weight shift to right;Trunk flexed;Antalgic Gait velocity: Decreasesd   General Gait Details: Pivotal steps to [facility] with RW and minA for stability; additional 18' ambulation with RW and intermittent minA, cues for sequencing and to increased LLE WBAT as pt with antalgic gait pattern walking on L toes to offload   Stairs             Wheelchair Mobility    Modified Rankin (Stroke Patients Only)       Balance Overall balance assessment: Needs assistance   Sitting balance-Leahy Scale: Fair Sitting balance - Comments: Able to perform pericare while seated on BSC (cues to not flex forward >90')   Standing balance support: Bilateral upper extremity supported;During functional activity Standing balance-Leahy Scale: Poor Standing balance comment: Reliant on UE support                            Cognition Arousal/Alertness: Awake/alert Behavior During Therapy: WFL for tasks assessed/performed Overall Cognitive Status: Within Functional Limits for tasks assessed                                 General Comments: WFL for simple tasks; not formally assessed. Very sharp      Exercises General Exercises - Lower Extremity Ankle Circles/Pumps: AROM;Both;Supine Quad Sets: AROM;Both;Supine Long Arc Quad: AROM;Both;Seated Other Exercises Other Exercises: Incentive spirometer x8 (cues for technique; pulled ~1000 mL)    General Comments General comments (skin integrity,  edema, etc.): SpO2 96% on RA, HR 68      Pertinent Vitals/Pain Pain Assessment: Faces Faces Pain Scale: Hurts little more Pain Location: LLE Pain Descriptors / Indicators: Discomfort;Grimacing;Guarding Pain Intervention(s): Monitored during session;Repositioned    Home  Living Family/patient expects to be discharged to:: Private residence Living Arrangements: Alone Available Help at Discharge: Family;Personal care attendant;Available PRN/intermittently Type of Home: House Home Access: Stairs to enter Entrance Stairs-Rails: None Home Layout: One level Home Equipment: Walker - 2 wheels;Grab bars - tub/shower;Bedside commode;Cane - single point Additional Comments: Son and daughter live next door. Pt has PCA 4x/wk from 9a-3p    Prior Function Level of Independence: Needs assistance  Gait / Transfers Assistance Needed: Mod indep ambulating with RW ADL's / Homemaking Assistance Needed: Pt reports independent with ADLs, including colostomy bag management; does not get into shower     PT Goals (current goals can now be found in the care plan section) Acute Rehab PT Goals Patient Stated Goal: Wants to do post-acute rehab at SNF before return home PT Goal Formulation: With patient/family Time For Goal Achievement: 12/18/20 Potential to Achieve Goals: Good Progress towards PT goals: Progressing toward goals    Frequency    Min 3X/week      PT Plan Current plan remains appropriate    Co-evaluation PT/OT/SLP Co-Evaluation/Treatment: Yes Reason for Co-Treatment: Other (comment);For patient/therapist safety;To address functional/ADL transfers PT goals addressed during session: Mobility/safety with mobility;Balance;Proper use of DME OT goals addressed during session: ADL's and self-care;Proper use of Adaptive equipment and DME      AM-PAC PT "6 Clicks" Mobility   Outcome Measure  Help needed turning from your back to your side while in a flat bed without using bedrails?: A Little Help needed moving from lying on your back to sitting on the side of a flat bed without using bedrails?: A Little Help needed moving to and from a bed to a chair (including a wheelchair)?: A Little Help needed standing up from a chair using your arms (e.g., wheelchair or  bedside chair)?: A Little Help needed to walk in hospital room?: A Little Help needed climbing 3-5 steps with a railing? : A Lot 6 Click Score: 17    End of Session Equipment Utilized During Treatment: Gait belt Activity Tolerance: Patient tolerated treatment well Patient left: in chair;with call bell/phone within reach;with chair alarm set Nurse Communication: Mobility status PT Visit Diagnosis: Other abnormalities of gait and mobility (R26.89);Muscle weakness (generalized) (M62.81);Pain Pain - Right/Left: Right Pain - part of body: Hip     Time: 1410-1434 PT Time Calculation (min) (ACUTE ONLY): 24 min  Charges:  $Therapeutic Exercise: 8-22 mins $Therapeutic Activity: 8-22 mins                    Mabeline Caras, PT, DPT Acute Rehabilitation Services  Pager [phone] Office [place] 12/04/2020, 2:58 PM

## 2020-12-04 NOTE — Evaluation (Signed)
Physical Therapy Evaluation Patient Details Name: [name] MRN: [id number] DOB: 1923-10-11 Today's Date: 12/04/2020   History of Present Illness  Pt is a 87 y.o. female admitted 12/02/20 after fall sustaining L femoral neck fx. S/p L hip hemiarthroplasty (posterior approach) 1/23. PMH includes DVT, HTN, PVD, back sx, colostomy.    Clinical Impression  Pt presents with an overall decrease in functional mobility secondary to above. PTA, pt mod indep ambulating with RW, has intermittent assist from children (who live next door) and PCA (4 days/wk) for household tasks. Educ on precautions, positioning, therex, and importance of mobility. Today, pt able to initiate bed mobility, transfer and gait training with RW and minA; frequent cues for technique to maintain posterior hip precautions with activity. Pt asymptomatic and denies dizziness during session. Son present and supportive. Pt would benefit from continued acute PT services to maximize functional mobility and independence prior to d/c with SNF-level therapies.   SpO2 95% on RA    Follow Up Recommendations SNF;Supervision for mobility/OOB    Equipment Recommendations  None recommended by PT    Recommendations for Other Services       Precautions / Restrictions Precautions Precautions: Posterior Hip;Fall Precaution Booklet Issued: No Restrictions Weight Bearing Restrictions: Yes LLE Weight Bearing: Weight bearing as tolerated      Mobility  Bed Mobility Overal bed mobility: Needs Assistance Bed Mobility: Supine to Sit     Supine to sit: Min assist     General bed mobility comments: MinA for RLE management and trunk elevation; cues to maintain posterior hip precautions, especially not flexing hips >90'    Transfers Overall transfer level: Needs assistance Equipment used: Rolling walker (2 wheeled) Transfers: Sit to/from Stand Sit to Stand: Min assist         General transfer comment: Multiple sit<>stands from  EOB and BSC to RW with minA for trunk elevation and stability; cues for technique while maintaining posterior hip precautions (i.e. not flexing forward to stand)  Ambulation/Gait Ambulation/Gait assistance: Min assist Gait Distance (Feet): 6 Feet Assistive device: Rolling walker (2 wheeled) Gait Pattern/deviations: Step-to pattern;Decreased weight shift to right;Trunk flexed;Antalgic Gait velocity: Decreasesd   General Gait Details: Slow, antalgic gait with RW and intermittent minA for stability and RW navigation; walked from bed>BSC>recliner with cues for sequencing; limited by pain and fatigue  Stairs            Wheelchair Mobility    Modified Rankin (Stroke Patients Only)       Balance Overall balance assessment: Needs assistance   Sitting balance-Leahy Scale: Fair       Standing balance-Leahy Scale: Poor Standing balance comment: Reliant on UE support                             Pertinent Vitals/Pain Pain Assessment: Faces Faces Pain Scale: Hurts little more Pain Location: LLE Pain Descriptors / Indicators: Discomfort;Grimacing;Guarding Pain Intervention(s): Monitored during session;Patient requesting pain meds-RN notified    Home Living Family/patient expects to be discharged to:: Private residence Living Arrangements: Alone Available Help at Discharge: Family;Personal care attendant;Available PRN/intermittently Type of Home: House Home Access: Stairs to enter Entrance Stairs-Rails: None   Home Layout: One level Home Equipment: Walker - 2 wheels;Grab bars - tub/shower;Bedside commode;Cane - single point Additional Comments: Son and daughter live next door. Pt has PCA 4x/wk from 9a-3p    Prior Function Level of Independence: Needs assistance   Gait / Transfers Assistance Needed: Mod  indep ambulating with RW  ADL's / Homemaking Assistance Needed: Pt reports independent with ADLs, including colostomy bag management; does not get into shower         Hand Dominance   Dominant Hand: Right    Extremity/Trunk Assessment   Upper Extremity Assessment Upper Extremity Assessment: Generalized weakness    Lower Extremity Assessment Lower Extremity Assessment: Generalized weakness;RLE deficits/detail RLE Deficits / Details: s/p R THA with post-op pain and weakness; anke and knee flex/ext at least 3/5       Communication   Communication: No difficulties  Cognition Arousal/Alertness: Awake/alert Behavior During Therapy: WFL for tasks assessed/performed Overall Cognitive Status: Within Functional Limits for tasks assessed                                 General Comments: WFL for simple tasks; not formally assessed      General Comments General comments (skin integrity, edema, etc.): Son Gershon Mussel) present and supportive; pt plans to d/c to SNF before returning home    Exercises General Exercises - Lower Extremity Ankle Circles/Pumps: AROM;Both;Seated Long Arc Quad: AROM;Both;Seated   Assessment/Plan    PT Assessment Patient needs continued PT services  PT Problem List Decreased strength;Decreased range of motion;Decreased activity tolerance;Decreased balance;Decreased mobility;Decreased knowledge of use of DME;Decreased knowledge of precautions;Pain       PT Treatment Interventions DME instruction;Gait training;Stair training;Functional mobility training;Therapeutic activities;Therapeutic exercise;Balance training;Patient/family education    PT Goals (Current goals can be found in the Care Plan section)  Acute Rehab PT Goals Patient Stated Goal: Wants to do post-acute rehab at SNF before return home PT Goal Formulation: With patient/family Time For Goal Achievement: 12/18/20 Potential to Achieve Goals: Good    Frequency Min 3X/week   Barriers to discharge Decreased caregiver support      Co-evaluation PT/OT/SLP Co-Evaluation/Treatment: Yes Reason for Co-Treatment: For patient/therapist safety;To  address functional/ADL transfers PT goals addressed during session: Mobility/safety with mobility;Balance;Proper use of DME         AM-PAC PT "6 Clicks" Mobility  Outcome Measure Help needed turning from your back to your side while in a flat bed without using bedrails?: A Little Help needed moving from lying on your back to sitting on the side of a flat bed without using bedrails?: A Little Help needed moving to and from a bed to a chair (including a wheelchair)?: A Little Help needed standing up from a chair using your arms (e.g., wheelchair or bedside chair)?: A Little Help needed to walk in hospital room?: A Little Help needed climbing 3-5 steps with a railing? : A Lot 6 Click Score: 17    End of Session Equipment Utilized During Treatment: Gait belt Activity Tolerance: Patient tolerated treatment well Patient left: in chair;with call bell/phone within reach;with chair alarm set;with family/visitor present Nurse Communication: Mobility status (pt wants to empty colostomy bag) PT Visit Diagnosis: Other abnormalities of gait and mobility (R26.89);Muscle weakness (generalized) (M62.81);Pain Pain - Right/Left: Right Pain - part of body: Hip    Time: 2025-4270 PT Time Calculation (min) (ACUTE ONLY): 28 min   Charges:   PT Evaluation $PT Eval Moderate Complexity: Straughn, PT, DPT Acute Rehabilitation Services  Pager [phone] Office [place] 12/04/2020, 12:49 PM

## 2020-12-05 LAB — BASIC METABOLIC PANEL
Anion gap: 11 (ref 5–15)
BUN: 16 mg/dL (ref 8–23)
CO2: 24 mmol/L (ref 22–32)
Calcium: 8.8 mg/dL — ABNORMAL LOW (ref 8.9–10.3)
Chloride: 100 mmol/L (ref 98–111)
Creatinine, Ser: 0.84 mg/dL (ref 0.44–1.00)
GFR, Estimated: >60 mL/min (ref >60)
Glucose, Bld: 101 mg/dL — ABNORMAL HIGH (ref 70–99)
Potassium: 4 mmol/L (ref 3.5–5.1)
Sodium: 135 mmol/L (ref 135–145)

## 2020-12-05 LAB — CBC
HCT: 36.5 % (ref 36.0–46.0)
Hemoglobin: 11.7 g/dL — ABNORMAL LOW (ref 12.0–15.0)
MCH: 29.8 pg (ref 26.0–34.0)
MCHC: 32.1 g/dL (ref 30.0–36.0)
MCV: 93.1 fL (ref 80.0–100.0)
Platelets: 237 10*3/uL (ref 150–400)
RBC: 3.92 MIL/uL (ref 3.87–5.11)
RDW: 15.1 % (ref 11.5–15.5)
WBC: 13.5 10*3/uL — ABNORMAL HIGH (ref 4.0–10.5)
nRBC: 0 % (ref 0.0–0.2)

## 2020-12-05 MED ORDER — ALUM & MAG HYDROXIDE-SIMETH 200-200-20 MG/5ML PO SUSP
30.0000 mL | Freq: Four times a day (QID) | ORAL | Status: DC | PRN
  Administered 2020-12-05: 30 mL via ORAL
  Filled 2020-12-05: qty 30

## 2020-12-05 MED ORDER — SUCRALFATE 1 G PO TABS
1.0000 g | ORAL_TABLET | Freq: Once | ORAL | Status: AC
  Administered 2020-12-05: 1 g via ORAL
  Filled 2020-12-05: qty 1

## 2020-12-05 MED ORDER — PANTOPRAZOLE SODIUM 40 MG IV SOLR
40.0000 mg | Freq: Two times a day (BID) | INTRAVENOUS | Status: DC
  Administered 2020-12-05 – 2020-12-07 (×5): 40 mg via INTRAVENOUS
  Filled 2020-12-05 (×5): qty 40

## 2020-12-05 NOTE — NC FL2 (Signed)
[place] MEDICAID FL2 LEVEL OF CARE SCREENING TOOL     IDENTIFICATION  Patient Name: [name] Birthdate: [birth year] Sex: female Admission Date (Current Location): 12/02/2020  [facility] and [place] Number:  Herbalist and Address:  [facility], [place] [address], [place], [place] [address]      Provider Number: [id number]  Attending Physician Name and Address:  Antonieta Pert, MD  Relative Name and Phone Number:       Current Level of Care: Hospital Recommended Level of Care: [place] Prior Approval Number:    Date Approved/Denied:   PASRR Number: [id number]  Discharge Plan: SNF    Current Diagnoses: Patient Active Problem List   Diagnosis Date Noted  . Hip fracture ([place]) 12/02/2020  . (HFpEF) heart failure with preserved ejection fraction ([place]) 10/27/2020  . Injury of extensor or abductor muscle, fascia, or tendon of thumb at forearm level 08/03/2020  . Orthostatic hypotension 05/24/2020  . Acute blood loss anemia 05/19/2020  . Traumatic hematoma of left hip 05/19/2020  . Current chronic use of systemic steroids 05/19/2020  . Advanced age [age]2019  . Secondary hypercoagulable state ([place]) 03/08/2020  . Hypokalemia 02/15/2020  . Colostomy status ([place]) 01/26/2020  . Diverticulitis of colon with perforation 01/26/2020  . Colostomy in place [facility]) 01/26/2020  . Anemia   . Debility 01/14/2020  . Physical debility 01/14/2020  . Persistent atrial fibrillation ([place])   . Closed fracture of left distal radius 12/29/2019  . Recurrent falls 12/29/2019  . Positive ANA (antinuclear antibody) 12/29/2019  . Osteoarthritis 11/13/2017  . Hypertriglyceridemia 01/29/2017  . Excessive gas 01/09/2017  . Physical exam 07/31/2016  . Elevated lipase 03/06/2016  . Chest pain 03/06/2016  . Osteopenia 02/08/2016  . Hypothyroidism 01/26/2016  . HTN (hypertension) 01/26/2016  . Left leg numbness 10/06/2012    Orientation RESPIRATION  BLADDER Height & Weight     Self,Time,Situation,Place  Normal Continent Weight: 68.9 kg Height:  5\' 7"  (170.2 cm)  BEHAVIORAL SYMPTOMS/MOOD NEUROLOGICAL BOWEL NUTRITION STATUS      Colostomy Diet (Refer to d/c summary)  AMBULATORY STATUS COMMUNICATION OF NEEDS Skin   Extensive Assist Verbally Surgical wounds (s/p L hip HEMIARTHROPLASTY, 12/03/2020)                       Personal Care Assistance Level of Assistance  Bathing,Feeding,Dressing Bathing Assistance: Maximum assistance Feeding assistance: Independent Dressing Assistance: Maximum assistance     Functional Limitations Info  Sight,Hearing,Speech Sight Info: Adequate Hearing Info: Adequate Speech Info: Adequate    SPECIAL CARE FACTORS FREQUENCY  PT (By licensed PT),OT (By licensed OT)     PT Frequency: 5x/week, evaluate and treat OT Frequency: 5x/week, evaluate and treat            Contractures Contractures Info: Not present    Additional Factors Info  Allergies   Allergies Info: Keflex (Cephalexin), Codeine           Current Medications (12/05/2020):  This is the current hospital active medication list Current Facility-Administered Medications  Medication Dose Route Frequency Provider Last Rate Last Admin  . acetaminophen (TYLENOL) tablet 325-650 mg  325-650 mg Oral Q4H PRN Margy Clarks M, PA-C      . alum & mag hydroxide-simeth (MAALOX/MYLANTA) 200-200-20 MG/5ML suspension 30 mL  30 mL Oral Q6H PRN Kc, Ramesh, MD   30 mL at 12/05/20 1044  . apixaban (ELIQUIS) tablet 5 mg  5 mg Oral BID Florencia Reasons, MD  5 mg at 12/05/20 0927  . bisacodyl (DULCOLAX) suppository 10 mg  10 mg Rectal Daily PRN Margy Clarks M, PA-C      . bupivacaine liposome (EXPAREL) 1.3 % injection 266 mg  20 mL Infiltration Once Margy Clarks M, PA-C      . diltiazem (CARDIZEM CD) 24 hr capsule 120 mg  120 mg Oral Daily Margy Clarks M, [place]   120 mg at 12/05/20 [id number]  . docusate sodium (COLACE) capsule 100 mg  100 mg Oral BID  Rachael Fee, PA-C   100 mg at 12/05/20 [id number]  . gabapentin (NEURONTIN) capsule 300 mg  300 mg Oral QHS Margy Clarks M, PA-C   300 mg at 12/05/20 0154  . HYDROcodone-acetaminophen (NORCO) 7.5-325 MG per tablet 1-2 tablet  1-2 tablet Oral Q4H PRN Rachael Fee, PA-C      . HYDROcodone-acetaminophen (NORCO/VICODIN) 5-325 MG per tablet 1-2 tablet  1-2 tablet Oral Q4H PRN Rachael Fee, PA-C      . HYDROmorphone (DILAUDID) injection 0.5 mg  0.5 mg Intravenous Q3H PRN Rachael Fee, PA-C   0.5 mg at 12/03/20 [id number]  . levothyroxine (SYNTHROID) tablet 50 mcg  50 mcg Oral QAC breakfast Rachael Fee, [place]   50 mcg at 12/05/20 [id number]  . metoCLOPramide (REGLAN) tablet 5-10 mg  5-10 mg Oral Q8H PRN Margy Clarks M, PA-C       Or  . metoCLOPramide (REGLAN) injection 5-10 mg  5-10 mg Intravenous Q8H PRN Margy Clarks M, PA-C      . morphine 2 MG/ML injection 0.5-1 mg  0.5-1 mg Intravenous Q2H PRN Margy Clarks M, PA-C      . nadolol (CORGARD) tablet 80 mg  80 mg Oral Daily Margy Clarks M, [place]   80 mg at 12/05/20 [id number]  . ondansetron (ZOFRAN) tablet 4 mg  4 mg Oral Q6H PRN Margy Clarks M, PA-C       Or  . ondansetron [facility]) injection 4 mg  4 mg Intravenous Q6H PRN Margy Clarks M, PA-C      . oxyCODONE (Oxy IR/ROXICODONE) immediate release tablet 5-10 mg  5-10 mg Oral Q4H PRN Margy Clarks M, PA-C      . pantoprazole (PROTONIX) injection 40 mg  40 mg Intravenous Q12H Antonieta Pert, MD   40 mg at 12/05/20 1046  . polyethylene glycol (MIRALAX / GLYCOLAX) packet 17 g  17 g Oral Daily Rachael Fee, PA-C   17 g at 12/05/20 0931  . predniSONE (DELTASONE) tablet 5 mg  5 mg Oral Q lunch Margy Clarks M, [place]   5 mg at 12/05/20 1135  . senna-docusate (Senokot-S) tablet 1 tablet  1 tablet Oral QHS Rachael Fee, [place]   1 tablet at 12/05/20 0154  . senna-docusate (Senokot-S) tablet 1 tablet  1 tablet Oral QHS PRN Margy Clarks M, PA-C      . sucralfate (CARAFATE) tablet 1 g   1 g Oral TID with meals Margy Clarks M, PA-C   1 g at 12/05/20 1138  . traMADol (ULTRAM) tablet 50 mg  50 mg Oral Q6H Margy Clarks M, PA-C   50 mg at 12/05/20 1135  . tranexamic acid (CYKLOKAPRON) IVPB 1,000 mg  1,000 mg Intravenous Once Margy Clarks M, PA-C       Facility-Administered Medications Ordered in Other Encounters  Medication Dose Route Frequency Provider Last Rate Last Admin  . bupivacaine liposome (EXPAREL) 1.3 % injection 266 mg  20 mL Infiltration Once Lenis Noon,  [place]         Discharge Medications: Please see discharge summary for a list of discharge medications.  Relevant Imaging Results:  Relevant Lab Results:   Additional Information SSN [id number]  Sharin Mons, RN

## 2020-12-05 NOTE — Progress Notes (Signed)
Notified B. Kyere again that pt having severe heart burn/indigestion. She wants Carafate. RN will continue to monitor.

## 2020-12-05 NOTE — Progress Notes (Signed)
Physical Therapy Treatment Patient Details Name: [name] MRN: [id number] DOB: 09/23/1923 Today's Date: 12/05/2020    History of Present Illness Pt is a 87 y.o. female admitted 12/02/20 after fall sustaining L femoral neck fx. S/p L hip hemiarthroplasty (posterior approach) 1/23. PMH includes DVT, HTN, PVD, back sx, colostomy.   PT Comments    Pt progressing with mobility, although limited by 2x near-syncopal events with standing activity this session; pt attributes this to bouts of nausea/indigestion overnight. Pt requiring up to [place] for mobility; remains limited by generalized weakness, pain, decreased activity tolerance, impaired balance, and now symptomatic hypotension. Continue to recommend post-acute rehab services to maximize functional mobility and independence.  Sitting BP 149/67 Standing BP 118/97 (pt only able to maintain standing ~15-sec before needing to sit due to dizziness)    Follow Up Recommendations  SNF;Supervision for mobility/OOB     Equipment Recommendations  None recommended by PT    Recommendations for Other Services       Precautions / Restrictions Precautions Precautions: Posterior Hip;Fall;Other (comment) Precaution Booklet Issued:  (pt able to recall 3/3 precautions at beginning of session without cues) Precaution Comments: Colostomy bag; symptomatic orthostatic hypotension (near syncope 2x during session on 1/25) Restrictions Weight Bearing Restrictions: Yes LLE Weight Bearing: Weight bearing as tolerated    Mobility  Bed Mobility Overal bed mobility: Needs Assistance Bed Mobility: Supine to Sit     Supine to sit: Mod assist     General bed mobility comments: ModA for LLE management and scooting hips to EOB, increased time and effort, limited by pain and fatigue, use of bed rails  Transfers Overall transfer level: Needs assistance Equipment used: Rolling walker (2 wheeled) Transfers: Sit to/from Stand Sit to Stand: Min assist          General transfer comment: Multiple sit<>stands from EOB and recliner, pt attempting to push with BUE support on bed/armrests but then lifts one hand off prematurely to reach for RW causing her to fall backwards; cues for hand placement/technique and sequencing with stability improved, minA for balance  Ambulation/Gait Ambulation/Gait assistance: Min guard;Max assist Gait Distance (Feet): 4 Feet Assistive device: Rolling walker (2 wheeled) Gait Pattern/deviations: Step-to pattern;Decreased weight shift to right;Trunk flexed;Antalgic Gait velocity: Decreasesd   General Gait Details: Slow, fatigued gait with RW and min guard, pt attempting to walk to toilet but becoming quiet and c/o dizziness requiring recliner to be brought up behind pt and maxA for controlled descent to chair due to near syncopal event   Stairs             Wheelchair Mobility    Modified Rankin (Stroke Patients Only)       Balance Overall balance assessment: Needs assistance   Sitting balance-Leahy Scale: Good Sitting balance - Comments: Able to empty colostomy bag independently while sitting EOB; intermittent cues to not flex >90'   Standing balance support: Bilateral upper extremity supported;During functional activity Standing balance-Leahy Scale: Poor                              Cognition Arousal/Alertness: Awake/alert Behavior During Therapy: WFL for tasks assessed/performed Overall Cognitive Status: Within Functional Limits for tasks assessed                                 General Comments: WFL for simple tasks; not formally assessed. Very sharp, although seems  to have increased fatigue this session      Exercises      General Comments General comments (skin integrity, edema, etc.): Orthostatic hypotension this session; SpO2 98% on RA, HR 73      Pertinent Vitals/Pain Pain Assessment: Faces Faces Pain Scale: Hurts little more Pain Location: LLE Pain  Descriptors / Indicators: Discomfort;Grimacing;Guarding Pain Intervention(s): Monitored during session    Home Living                      Prior Function            PT Goals (current goals can now be found in the care plan section) Progress towards PT goals: Progressing toward goals (slowly; limited by orthostatic hypotension this session)    Frequency    Min 3X/week      PT Plan Current plan remains appropriate    Co-evaluation              AM-PAC PT "6 Clicks" Mobility   Outcome Measure  Help needed turning from your back to your side while in a flat bed without using bedrails?: A Lot Help needed moving from lying on your back to sitting on the side of a flat bed without using bedrails?: A Lot Help needed moving to and from a bed to a chair (including a wheelchair)?: A Little Help needed standing up from a chair using your arms (e.g., wheelchair or bedside chair)?: A Little Help needed to walk in hospital room?: A Lot Help needed climbing 3-5 steps with a railing? : A Lot 6 Click Score: 14    End of Session Equipment Utilized During Treatment: Gait belt Activity Tolerance: Patient tolerated treatment well;Treatment limited secondary to medical complications (Comment) (near-syncope, hypotension) Patient left: in chair;with call bell/phone within reach;with chair alarm set Nurse Communication: Mobility status PT Visit Diagnosis: Other abnormalities of gait and mobility (R26.89);Muscle weakness (generalized) (M62.81);Pain Pain - Right/Left: Right Pain - part of body: Hip     Time: 3016-0109 PT Time Calculation (min) (ACUTE ONLY): 30 min  Charges:  $Therapeutic Activity: 23-37 mins                     Mabeline Caras, PT, DPT Acute Rehabilitation Services  Pager [phone] Office [place] 12/05/2020, 9:11 AM

## 2020-12-05 NOTE — Progress Notes (Addendum)
PROGRESS NOTE    [name]  [id number] DOB: October 02, 1923 DOA: 12/02/2020 PCP: [name], MD   Chief Complaint  Patient presents with  . Fall  . Hip Pain    Left   Brief Narrative: 87 year old female with PAF on Eliquis, polymyalgia on chronic prednisone, GERD, hypertension, hypothyroidism, ruptured diverticulitis with colostomy presented after a fall and found to have left hip fracture.  She underwent ORIF by Dr Percell Miller 12/03/20. Postop mobilizing more, working with PT OT  Subjective: Last night had episode of indigestion got carafate and pain meds and got better. Has had similar episode in the past not new Feels better. No CP/SOB or fever Worked w/ PT in the room   Assessment & Plan:  Left Hip fracture s/p ORIF by Dr Percell Miller 12/03/20.  Continue PT OT again today, CR versus skilled nursing facility.  Continue pain control and Eliquis for DVT prophylaxis.   Leukocytosis noted today but afebrile, likely postop/reactive.  Improving. Recent Labs  Lab 12/02/20 1420 12/04/20 0500 12/05/20 0409  WBC 10.6* 16.7* 13.5*   PAF currently in NSR, continue her home cardiem, nadolol and Eliquis.    Heartburn/indigestion continue PPI changed to IV, continue Carafate at home dose, added Maalox as needed.  Patient reports she has had similar episode in the past.  Of note she is on chronic prednisone which could contribute to gastritis/esophagitis. she felt better after Carafate last night.  Hypertension BP is controlled on home Cardizem nadolol.  Losartan on hold-can resume upon discharge.  Hypothyroidism: On Synthroid  PMR on chronic prednisone 5 mg, continue the same.   Colostomy status continue routine colostomy care.  Nutrition: Diet Order            Diet regular Room service appropriate? Yes; Fluid consistency: Thin  Diet effective now                 Body mass index is 23.81 kg/m.  DVT prophylaxis: SCDs Start: 12/02/20 2159 Code Status:   Code Status: Full  Code  Family Communication: plan of care discussed with patient at bedside. Son was updated at the bedside yesterday.  Status is: Inpatient Remains inpatient appropriate because:Unsafe d/c plan and Inpatient level of care appropriate due to severity of illness  Dispo: The patient is from: Home              Anticipated d/c is to:SNF              Anticipated d/c date is: 1 day              Patient currently is not medically stable to d/c.   Difficult to place patient No   Consultants:see note  Procedures:see note  Culture/Microbiology    Component Value Date/Time   SDES  05/21/2020 0927    Urine Performed at [facility], [place] [address], [place], [place] [address]    SPECREQUEST  05/21/2020 [id number]    NONE Performed at [facility], [place] [address] [place], [place] [address]    CULT  05/21/2020 [id number]    NO GROWTH Performed at [facility], [place] [address] [place],  [address]    REPTSTATUS 05/22/2020 FINAL 05/21/2020 [id number]    Other culture-see note  Medications: Scheduled Meds: . apixaban  5 mg Oral BID  . bupivacaine liposome  20 mL Infiltration Once  . diltiazem  120 mg Oral Daily  . docusate sodium  100 mg Oral BID  . gabapentin  300 mg Oral QHS  . levothyroxine  50 mcg Oral QAC breakfast  . nadolol  80 mg Oral Daily  . pantoprazole  40 mg Oral BID AC  . polyethylene glycol  17 g Oral Daily  . predniSONE  5 mg Oral Q lunch  . senna-docusate  1 tablet Oral QHS  . sucralfate  1 g Oral TID with meals  . traMADol  50 mg Oral Q6H   Continuous Infusions: . tranexamic acid      Antimicrobials: Anti-infectives (From admission, onward)   Start     Dose/Rate Route Frequency Ordered Stop   12/03/20 1400  ceFAZolin (ANCEF) IVPB 2g/100 mL premix        2 g 200 mL/hr over 30 Minutes Intravenous Every 6 hours 12/03/20 1118 12/04/20 0300   12/03/20 0600  ceFAZolin (ANCEF) IVPB 2g/100 mL premix        2 g 200 mL/hr over 30  Minutes Intravenous On call to O.R. 12/02/20 2335 12/03/20 0800     Objective: Vitals: Today's Vitals   12/04/20 0839 12/04/20 1527 12/04/20 2000 12/04/20 2006  BP: (!) 147/72 (!) 141/66  (!) 156/65  Pulse: 80 65  71  Resp: 17 17  18   Temp: 98.4 F (36.9 C) 98.3 F (36.8 C)  98.4 F (36.9 C)  TempSrc: Oral Oral  Oral  SpO2: 92% 93%  96%  Weight:      Height:      PainSc:   0-No pain     Intake/Output Summary (Last 24 hours) at 12/05/2020 0821 Last data filed at 12/05/2020 0600 Gross per 24 hour  Intake 480 ml  Output 1050 ml  Net -570 ml   Filed Weights   12/02/20 1342  Weight: 68.9 kg   Weight change:   Intake/Output from previous day: 01/24 0701 - 01/25 0700 In: 480 [P.O.:480] Out: 1050 [Urine:1050] Intake/Output this shift: No intake/output data recorded. Filed Weights   12/02/20 1342  Weight: 68.9 kg    Examination: General exam: AAO elderly,frial, appears younger for age.   HEENT:Oral mucosa moist,Ear/Nose WNL grossly,dentition normal. Respiratory system:B/L diminished at the base,no wheezing or crackles,no use of accessory muscle Cardiovascular system: S1 & S2 +, No JVD. Gastrointestinal system: Abdomen soft,NT,ND,BS+ Nervous System:Alert, awake, moving extremities and grossly nonfocal Extremities: No edema, distal peripheral pulses palpable.  Left hip surgical site with aquacel dressing intact. Skin: No rashes,no icterus. MSK: Normal muscle bulk,tone, power  Data Reviewed: I have personally reviewed following labs and imaging studies CBC: Recent Labs  Lab 12/02/20 1420 12/04/20 0500 12/05/20 0409  WBC 10.6* 16.7* 13.5*  NEUTROABS 7.4  --   --   HGB 13.3 11.6* 11.7*  HCT 42.3 35.1* 36.5  MCV 94.8 92.1 93.1  PLT 261 235 784   Basic Metabolic Panel: Recent Labs  Lab 11/29/20 1137 12/02/20 1420 12/04/20 0500 12/05/20 0409  NA 140 142 137 135  K 4.5 3.7 3.7 4.0  CL 102 107 102 100  CO2 25 26 24 24   GLUCOSE 91 100* 155* 101*  BUN 14 13  16 16   CREATININE 1.15* 0.92 0.93 0.84  CALCIUM 9.8 9.2 8.7* 8.8*  MG  --   --  1.8  --    GFR: Estimated Creatinine Clearance: 39 mL/min (by C-G formula based on SCr of 0.84 mg/dL). Liver Function Tests: Recent Labs  Lab 12/02/20 1420  AST 20  ALT 16  ALKPHOS 83  BILITOT 0.6  PROT 6.2*  ALBUMIN 3.6   No results  for input(s): LIPASE, AMYLASE in the last 168 hours. No results for input(s): AMMONIA in the last 168 hours. Coagulation Profile: Recent Labs  Lab 12/02/20 1420  INR 1.2   Cardiac Enzymes: No results for input(s): CKTOTAL, CKMB, CKMBINDEX, TROPONINI in the last 168 hours. BNP (last 3 results) No results for input(s): PROBNP in the last 8760 hours. HbA1C: No results for input(s): HGBA1C in the last 72 hours. CBG: No results for input(s): GLUCAP in the last 168 hours. Lipid Profile: No results for input(s): CHOL, HDL, LDLCALC, TRIG, CHOLHDL, LDLDIRECT in the last 72 hours. Thyroid Function Tests: No results for input(s): TSH, T4TOTAL, FREET4, T3FREE, THYROIDAB in the last 72 hours. Anemia Panel: No results for input(s): VITAMINB12, FOLATE, FERRITIN, TIBC, IRON, RETICCTPCT in the last 72 hours. Sepsis Labs: No results for input(s): PROCALCITON, LATICACIDVEN in the last 168 hours.  Recent Results (from the past 240 hour(s))  SARS CORONAVIRUS 2 (TAT 6-24 HRS) Nasopharyngeal Nasopharyngeal Swab     Status: None   Collection Time: 12/02/20  8:38 PM   Specimen: Nasopharyngeal Swab  Result Value Ref Range Status   SARS Coronavirus 2 NEGATIVE NEGATIVE Final    Comment: (NOTE) SARS-CoV-2 target nucleic acids are NOT DETECTED.  The SARS-CoV-2 RNA is generally detectable in upper and lower respiratory specimens during the acute phase of infection. Negative results do not preclude SARS-CoV-2 infection, do not rule out co-infections with other pathogens, and should not be used as the sole basis for treatment or other patient management decisions. Negative results  must be combined with clinical observations, patient history, and epidemiological information. The expected result is Negative.  Fact Sheet for Patients: SugarRoll.be  Fact Sheet for Healthcare Providers: https://www.woods-mathews.com/  This test is not yet approved or cleared by the [place] FDA and  has been authorized for detection and/or diagnosis of SARS-CoV-2 by FDA under an Emergency Use Authorization (EUA). This EUA will remain  in effect (meaning this test can be used) for the duration of the COVID-19 declaration under Se ction 564(b)(1) of the Act, 21 U.S.C. section 360bbb-3(b)(1), unless the authorization is terminated or revoked sooner.  Performed at [facility], [place] [address] [place], [place] [address]   MRSA PCR Screening     Status: None   Collection Time: 12/02/20 11:47 PM   Specimen: Nasal Mucosa; Nasopharyngeal  Result Value Ref Range Status   MRSA by PCR NEGATIVE NEGATIVE Final    Comment:        The GeneXpert MRSA Assay (FDA approved for NASAL specimens only), is one component of a comprehensive MRSA colonization surveillance program. It is not intended to diagnose MRSA infection nor to guide or monitor treatment for MRSA infections. Performed at [facility], [place] [address], [place],  [address]      Radiology Studies: DG HIP PORT UNILAT WITH PELVIS 1V LEFT  Result Date: 12/04/2020 CLINICAL DATA:  Post LEFT total hip arthroplasty EXAM: DG HIP (WITH OR WITHOUT PELVIS) 1V PORT LEFT COMPARISON:  Portable exam 0909 hours compared to 12/02/2020 FINDINGS: Interval resection of LEFT femoral head and fractured femoral neck. LEFT hip prosthesis identified in expected position. Osseous demineralization. Degenerative changes of the RIGHT hip joint. No fracture or dislocation. IMPRESSION: LEFT hip prosthesis without acute complication. Electronically Signed   By: Lavonia Dana M.D.   On: 12/04/2020  09:51     LOS: 3 days   Antonieta Pert, MD [facility]  12/05/2020, 8:21 AM

## 2020-12-05 NOTE — Progress Notes (Signed)
Notified B. Lennox Grumbles, TRH that pt states she is having really bad heartburn and wants to know can she take her Carafate now?  RN will continue to monitor.

## 2020-12-05 NOTE — TOC Initial Note (Addendum)
Transition of Care [facility]) - Initial/Assessment Note    Patient Details  Name: [name] MRN: [id number] Date of Birth: [birth year]  [facility] CM/SW Contact:    Sharin Mons, RN Phone Number: 12/05/2020, 4:38 PM  Clinical Narrative:    Admitted 12/02/20 after fall sustaining L femoral neck fx.  Hx of DVT, HTN, PVD, back sx, colostomy. From home alone.             - s/p L hip hemiarthroplasty 1/23  PTA independent with ADL's, no DME usage.        RNCM received consult for possible SNF placement at time of discharge. RNCM spoke with patient regarding PT recommendation of SNF placement at time of discharge. Patient reported she is currently unable to care for self independently given her current physical needs and fall risk. Patient expressed understanding of PT recommendation and is agreeable to SNF placement at time of discharge. Patient reports has no preference. States has been to [facility] before and experience was ok preference . RNCM discussed insurance authorization process and provided Medicare SNF ratings list. Patient expressed being hopeful for rehab and to feel better soon. No further questions reported at this time. RNCM to continue to follow and assist with discharge planning needs.  Pt fully COVID vaccinated.  Expected Discharge Plan: Skilled Nursing Facility Barriers to Discharge: Continued Medical Work up   Patient Goals and CMS Choice Patient states their goals for this hospitalization and ongoing recovery are:: To get better and go home CMS Medicare.gov Compare Post Acute Care list provided to:: Patient    Expected Discharge Plan and Services Expected Discharge Plan: [place]   Discharge Planning Services: CM Consult   Living arrangements for the past 2 months: Single Family Home                                      Prior Living Arrangements/Services Living arrangements for the past 2 months: Single Family  Home Lives with:: Self Patient language and need for interpreter reviewed:: Yes Do you feel safe going back to the place where you live?: Yes      Need for Family Participation in Patient Care: Yes (Comment) Care giver support system in place?: Yes (comment)   Criminal Activity/Legal Involvement Pertinent to Current Situation/Hospitalization: No - Comment as needed  Activities of Daily Living Home Assistive Devices/Equipment: Walker (specify type),Blood pressure cuff ADL Screening (condition at time of admission) Patient's cognitive ability adequate to safely complete daily activities?: Yes Is the patient deaf or have difficulty hearing?: No Does the patient have difficulty seeing, even when wearing glasses/contacts?: No Does the patient have difficulty concentrating, remembering, or making decisions?: No Patient able to express need for assistance with ADLs?: Yes Does the patient have difficulty dressing or bathing?: No Independently performs ADLs?: Yes (appropriate for developmental age) Does the patient have difficulty walking or climbing stairs?: No Weakness of Legs: None Weakness of Arms/Hands: None  Permission Sought/Granted                  Emotional Assessment Appearance:: Appears stated age Attitude/Demeanor/Rapport: Gracious Affect (typically observed): Accepting Orientation: : Oriented to Self,Oriented to Place,Oriented to  Time,Oriented to Situation Alcohol / Substance Use: Not Applicable Psych Involvement: No (comment)  Admission diagnosis:  Hip fracture ([place]) [S72.009A] Fall [W19.XXXA] Left hip pain [M25.552] Fall, initial encounter [W19.XXXA] Patient Active Problem List  Diagnosis Date Noted  . Hip fracture ([place]) 12/02/2020  . (HFpEF) heart failure with preserved ejection fraction ([place]) 10/27/2020  . Injury of extensor or abductor muscle, fascia, or tendon of thumb at forearm level 08/03/2020  . Orthostatic hypotension 05/24/2020  . Acute blood loss  anemia 05/19/2020  . Traumatic hematoma of left hip 05/19/2020  . Current chronic use of systemic steroids 05/19/2020  . Advanced age 54/12[birth year]2019  . Secondary hypercoagulable state ([place]) 03/08/2020  . Hypokalemia 02/15/2020  . Colostomy status ([place]) 01/26/2020  . Diverticulitis of colon with perforation 01/26/2020  . Colostomy in place [facility]) 01/26/2020  . Anemia   . Debility 01/14/2020  . Physical debility 01/14/2020  . Persistent atrial fibrillation ([place])   . Closed fracture of left distal radius 12/29/2019  . Recurrent falls 12/29/2019  . Positive ANA (antinuclear antibody) 12/29/2019  . Osteoarthritis 11/13/2017  . Hypertriglyceridemia 01/29/2017  . Excessive gas 01/09/2017  . Physical exam 07/31/2016  . Elevated lipase 03/06/2016  . Chest pain 03/06/2016  . Osteopenia 02/08/2016  . Hypothyroidism 01/26/2016  . HTN (hypertension) 01/26/2016  . Left leg numbness 10/06/2012   PCP:  [name], MD Pharmacy:   [id number] - [place], [place] - [address] [place] [address] Phone: [phone] Fax: [phone]     Social Determinants of Health (SDOH) Interventions    Readmission Risk Interventions No flowsheet data found.

## 2020-12-05 NOTE — Progress Notes (Signed)
    Subjective: Patient reports pain as moderate.   Tolerating diet.  No CP, SOB.   No weakness/dizziness.  OOB with PT/OT  Objective:   VITALS:   Vitals:   12/04/20 0601 12/04/20 0839 12/04/20 1527 12/04/20 2006  BP: (!) 173/96 (!) 147/72 (!) 141/66 (!) 156/65  Pulse: 73 80 65 71  Resp: 18 17 17 18   Temp: 98.5 F (36.9 C) 98.4 F (36.9 C) 98.3 F (36.8 C) 98.4 F (36.9 C)  TempSrc: Oral Oral Oral Oral  SpO2: 99% 92% 93% 96%  Weight:      Height:       CBC Latest Ref Rng & Units 12/05/2020 12/04/2020 12/02/2020  WBC 4.0 - 10.5 K/uL 13.5(H) 16.7(H) 10.6(H)  Hemoglobin 12.0 - 15.0 g/dL 11.7(L) 11.6(L) 13.3  Hematocrit 36.0 - 46.0 % 36.5 35.1(L) 42.3  Platelets 150 - 400 K/uL 237 235 261   BMP Latest Ref Rng & Units 12/05/2020 12/04/2020 12/02/2020  Glucose 70 - 99 mg/dL 101(H) 155(H) 100(H)  BUN 8 - 23 mg/dL 16 16 13   Creatinine 0.44 - 1.00 mg/dL 0.84 0.93 0.92  BUN/Creat Ratio 12 - 28 - - -  Sodium 135 - 145 mmol/L 135 137 142  Potassium 3.5 - 5.1 mmol/L 4.0 3.7 3.7  Chloride 98 - 111 mmol/L 100 102 107  CO2 22 - 32 mmol/L 24 24 26   Calcium 8.9 - 10.3 mg/dL 8.8(L) 8.7(L) 9.2   Intake/Output      01/24 0701 01/25 0700 01/25 0701 01/26 0700   P.O. 480    I.V. (mL/kg)     IV Piggyback     Total Intake(mL/kg) 480 (7)    Urine (mL/kg/hr) 1050 (0.6)    Blood     Total Output 1050    Net -570             Physical Exam: General: NAD.  Resting in bed.  Calm, conversant.  No increased work of breathing. MSK Neurovascularly intact Sensation intact distally Intact pulses distally Dorsiflexion/Plantar flexion intact Incision: dressing C/D/I   Assessment: 2 Days Post-Op  S/P Procedure(s) (LRB): ARTHROPLASTY BIPOLAR HIP (HEMIARTHROPLASTY) (Left) by Dr. Ernesta Amble. Percell Miller on 12/03/20  Principal Problem:   Hip fracture ([employer]) Active Problems:   Hypothyroidism   HTN (hypertension)   Persistent atrial fibrillation (H[facility])   Colostomy status ([place])     Closed left  femoral neck fracture, status post Hemiarthroplasty Doing well postop day 2 Tolerating diet and voiding Pain controlled Not yet mobilized  Plan: Up with therapy Incentive Spirometry Apply ice PRN  Weightbearing: WBAT LLE. Posterior Hip Precautions. Insicional and dressing care: Dressings left intact until follow-up Showering: Keep dressing dry VTE prophylaxis: Pt. own eliquis , SCDs, ambulation Pain control: Minimize narcotics.  Continue current regimen. Follow - up plan: 2 weeks Contact information:  Edmonia Lynch MD, Margy Clarks PA-C  Dispo: TBD, probably to inpatient rehab.  Discharge when mobilized and ready medically.  Rachael Fee, PA-C 12/05/2020, 7:24 AM

## 2020-12-06 LAB — CBC
HCT: 35.3 % — ABNORMAL LOW (ref 36.0–46.0)
Hemoglobin: 11.2 g/dL — ABNORMAL LOW (ref 12.0–15.0)
MCH: 29.8 pg (ref 26.0–34.0)
MCHC: 31.7 g/dL (ref 30.0–36.0)
MCV: 93.9 fL (ref 80.0–100.0)
Platelets: 244 10*3/uL (ref 150–400)
RBC: 3.76 MIL/uL — ABNORMAL LOW (ref 3.87–5.11)
RDW: 15 % (ref 11.5–15.5)
WBC: 12.2 10*3/uL — ABNORMAL HIGH (ref 4.0–10.5)
nRBC: 0 % (ref 0.0–0.2)

## 2020-12-06 LAB — BASIC METABOLIC PANEL
Anion gap: 8 (ref 5–15)
BUN: 17 mg/dL (ref 8–23)
CO2: 26 mmol/L (ref 22–32)
Calcium: 8.7 mg/dL — ABNORMAL LOW (ref 8.9–10.3)
Chloride: 103 mmol/L (ref 98–111)
Creatinine, Ser: 0.78 mg/dL (ref 0.44–1.00)
GFR, Estimated: >60 mL/min (ref >60)
Glucose, Bld: 95 mg/dL (ref 70–99)
Potassium: 4.1 mmol/L (ref 3.5–5.1)
Sodium: 137 mmol/L (ref 135–145)

## 2020-12-06 LAB — SARS CORONAVIRUS 2 (TAT 6-24 HRS): SARS Coronavirus 2: NEGATIVE

## 2020-12-06 MED ORDER — PREDNISONE 1 MG PO TABS
4.0000 mg | ORAL_TABLET | Freq: Every day | ORAL | Status: DC
  Administered 2020-12-06 – 2020-12-07 (×2): 4 mg via ORAL
  Filled 2020-12-06 (×2): qty 4

## 2020-12-06 NOTE — Discharge Summary (Signed)
Physician Discharge Summary  BIVIANA KUSCH [id number] DOB: May 03, 1923 DOA: 12/02/2020  PCP: [name], MD  Admit date: 12/02/2020 Discharge date: 12/07/2020  Admitted From: Home Disposition: SNF  Recommendations for Outpatient Follow-up:  1. Follow up with PCP in 1-2 weeks 2. Please obtain BMP/CBC in one week 3. Please follow up on the following pending results:  Home Health:na  Equipment/Devices: na  Discharge Condition: Stable Code Status:   Code Status: Full Code Diet recommendation:  Diet Order            Diet - low sodium heart healthy           Diet regular Room service appropriate? Yes; Fluid consistency: Thin  Diet effective now                  Brief/Interim Summary:  87 year old female with PAF on Eliquis, polymyalgia on chronic prednisone, GERD, hypertension, hypothyroidism, ruptured diverticulitis with colostomy presented after a fall and found to have left hip fracture.  She underwent ORIF by Dr Percell Miller 12/03/20. Postop mobilizing more, working with PT OT PT OT has advised skilled nursing facility, patient lives alone. At this time patient is medically stable for discharge to skilled nursing facility with follow-up with PCP orthopedic in outpatient issues.  Discharge Diagnoses:   Left Hip fracture s/p ORIF by Dr Percell Miller 12/03/20.    Doing well postop, seen by PT OT and will be discharged to skilled nursing facility. Continue DVT prophylaxis with home Eliquis, continue pain control.   Leukocytosis likely reactive in the setting of a fracture, no fever.  Resolved Recent Labs  Lab 12/02/20 1420 12/04/20 0500 12/05/20 0409 12/06/20 0213 12/07/20 0120  WBC 10.6* 16.7* 13.5* 12.2* 10.3     PAF currently in NSR, continue her home  eliquis, Cardizem, nadolol.   Heartburn/indigestion continue on her home Carafate, PPI, Maalox.Patient reports she has had similar episode in the past, and had extensive testing including cardiac test in the emergency  room. Of note she is on chronic prednisone which could contribute to gastritis/esophagitis.  It has resolved. Hypertension  BP controlled continue home Cardizem and nadolol and losartan. Hypothyroidism: Continue her Synthroid PMR on chronic prednisone decreased to 4 mg as per outpatient plan.   Colostomy status continue routine colostomy care  Consults:  Orthopedics  Subjective: Alert awake oriented, feels well, plan for discharge to skilled nursing facility.  Discharge Exam: Vitals:   12/07/20 0809 12/07/20 0946  BP:  (!) 144/64  Pulse: 66 64  Resp: 16   Temp: 99.7 F (37.6 C)   SpO2:     General exam: AAOx3 , NAD, weak appearing. HEENT:Oral mucosa moist, Ear/Nose WNL grossly, dentition normal. Respiratory system: bilaterally clear,no wheezing or crackles,no use of accessory muscle Cardiovascular system: S1 & S2 +, No JVD,. Gastrointestinal system: Abdomen soft, NT,ND, BS+ Nervous System:Alert, awake, moving extremities and grossly nonfocal Extremities: No edema, distal peripheral pulses palpable.  Skin: No rashes,no icterus. MSK: Normal muscle bulk,tone, power Discharge Instructions  Discharge Instructions    Diet - low sodium heart healthy   Complete by: As directed    Discharge instructions   Complete by: As directed    Follow-up with orthopedic clinic 1 week.  Please call call MD or return to ER for similar or worsening recurring problem that brought you to hospital or if any fever,nausea/vomiting,abdominal pain, uncontrolled pain, chest pain,  shortness of breath or any other alarming symptoms.  Please follow-up your doctor as instructed in a week time  and call the office for appointment.  Please avoid alcohol, smoking, or any other illicit substance and maintain healthy habits including taking your regular medications as prescribed.  You were cared for by a hospitalist during your hospital stay. If you have any questions about your discharge medications or the  care you received while you were in the hospital after you are discharged, you can call the unit and ask to speak with the hospitalist on call if the hospitalist that took care of you is not available.  Once you are discharged, your primary care physician will handle any further medical issues. Please note that NO REFILLS for any discharge medications will be authorized once you are discharged, as it is imperative that you return to your primary care physician (or establish a relationship with a primary care physician if you do not have one) for your aftercare needs so that they can reassess your need for medications and monitor your lab values   Discharge wound care:   Complete by: As directed    Reinforced the dressing, follow-up with orthopedic for this instruction.   Increase activity slowly   Complete by: As directed      Allergies as of 12/07/2020      Reactions   Keflex [cephalexin] Nausea Only, Other (See Comments)   Pt ended up in ER w/ CHEST PAIN   Codeine Other (See Comments)   "Nightmares, imagined things"      Medication List    STOP taking these medications   diltiazem 120 MG 24 hr capsule Commonly known as: CARDIZEM CD     TAKE these medications   acetaminophen 500 MG tablet Commonly known as: TYLENOL Take 500 mg by mouth at bedtime.   acetaminophen 325 MG tablet Commonly known as: TYLENOL Take 1-2 tablets (325-650 mg total) by mouth every 4 (four) hours as needed for mild pain.   alum & mag hydroxide-simeth 200-200-20 MG/5ML suspension Commonly known as: MAALOX/MYLANTA Take 30 mLs by mouth every 6 (six) hours as needed for indigestion or heartburn.   apixaban 5 MG Tabs tablet Commonly known as: ELIQUIS Take 1 tablet (5 mg total) by mouth 2 (two) times daily.   bisacodyl 10 MG suppository Commonly known as: DULCOLAX Place 1 suppository (10 mg total) rectally daily as needed for moderate constipation.   diltiazem 120 MG 24 hr capsule Commonly known as:  DILACOR XR Take 1 capsule (120 mg total) by mouth daily.   docusate sodium 100 MG capsule Commonly known as: COLACE Take 1 capsule (100 mg total) by mouth 2 (two) times daily.   gabapentin 300 MG capsule Commonly known as: NEURONTIN Take 300 mg by mouth at bedtime.   levothyroxine 50 MCG tablet Commonly known as: SYNTHROID TAKE 1 TABLET BY MOUTH EVERY DAY What changed: when to take this   losartan 50 MG tablet Commonly known as: COZAAR Take 1 tablet (50 mg total) by mouth daily.   nadolol 80 MG tablet Commonly known as: CORGARD TAKE 1 TABLET BY MOUTH EVERY DAY What changed: when to take this   pantoprazole 40 MG tablet Commonly known as: PROTONIX Take 1 tablet (40 mg total) by mouth daily. What changed: when to take this   polyethylene glycol 17 g packet Commonly known as: MIRALAX / GLYCOLAX Take 17 g by mouth See admin instructions. Mix 17 grams of powder into 8 ounces of water and drink two times a day and an additional 17 grams once daily as needed for constiptaion   predniSONE  1 MG tablet Commonly known as: DELTASONE Take 4 tablets (4 mg total) by mouth daily with lunch. What changed:   medication strength  how much to take   PRESERVISION AREDS 2 PO Take 1 tablet by mouth daily at 12 noon.   senna-docusate 8.6-50 MG tablet Commonly known as: Senokot-S Take 1 tablet by mouth at bedtime. What changed: Another medication with the same name was added. Make sure you understand how and when to take each.   senna-docusate 8.6-50 MG tablet Commonly known as: Senokot-S Take 1 tablet by mouth at bedtime as needed for mild constipation. What changed: You were already taking a medication with the same name, and this prescription was added. Make sure you understand how and when to take each.   sucralfate 1 g tablet Commonly known as: Carafate Take 1 tablet (1 g total) by mouth with breakfast, with lunch, and with evening meal.            Discharge Care  Instructions  (From admission, onward)         Start     Ordered   12/07/20 0000  Discharge wound care:       Comments: Reinforced the dressing, follow-up with orthopedic for this instruction.   12/07/20 1006          Contact information for after-discharge care    Destination    [facility] Preferred SNF .   Service: Skilled Nursing Contact information: [place] [phone]                 Allergies  Allergen Reactions  . Keflex [Cephalexin] Nausea Only and Other (See Comments)    Pt ended up in ER w/ CHEST PAIN  . Codeine Other (See Comments)    "Nightmares, imagined things"    The results of significant diagnostics from this hospitalization (including imaging, microbiology, ancillary and laboratory) are listed below for reference.    Microbiology: Recent Results (from the past 240 hour(s))  SARS CORONAVIRUS 2 (TAT 6-24 HRS) Nasopharyngeal Nasopharyngeal Swab     Status: None   Collection Time: 12/02/20  8:38 PM   Specimen: Nasopharyngeal Swab  Result Value Ref Range Status   SARS Coronavirus 2 NEGATIVE NEGATIVE Final    Comment: (NOTE) SARS-CoV-2 target nucleic acids are NOT DETECTED.  The SARS-CoV-2 RNA is generally detectable in upper and lower respiratory specimens during the acute phase of infection. Negative results do not preclude SARS-CoV-2 infection, do not rule out co-infections with other pathogens, and should not be used as the sole basis for treatment or other patient management decisions. Negative results must be combined with clinical observations, patient history, and epidemiological information. The expected result is Negative.  Fact Sheet for Patients: SugarRoll.be  Fact Sheet for Healthcare Providers: https://www.woods-mathews.com/  This test is not yet approved or cleared by the [place] FDA and  has been authorized for detection and/or  diagnosis of SARS-CoV-2 by FDA under an Emergency Use Authorization (EUA). This EUA will remain  in effect (meaning this test can be used) for the duration of the COVID-19 declaration under Se ction 564(b)(1) of the Act, 21 U.S.C. section 360bbb-3(b)(1), unless the authorization is terminated or revoked sooner.  Performed at [facility], [place] [address] [place],  [address]   MRSA PCR Screening     Status: None   Collection Time: 12/02/20 11:47 PM   Specimen: Nasal Mucosa; Nasopharyngeal  Result Value Ref Range Status   MRSA  by PCR NEGATIVE NEGATIVE Final    Comment:        The GeneXpert MRSA Assay (FDA approved for NASAL specimens only), is one component of a comprehensive MRSA colonization surveillance program. It is not intended to diagnose MRSA infection nor to guide or monitor treatment for MRSA infections. Performed at [facility], [address], [place], [place] [address]   SARS CORONAVIRUS 2 (TAT 6-24 HRS) Nasopharyngeal Nasopharyngeal Swab     Status: None   Collection Time: 12/06/20 11:52 AM   Specimen: Nasopharyngeal Swab  Result Value Ref Range Status   SARS Coronavirus 2 NEGATIVE NEGATIVE Final    Comment: (NOTE) SARS-CoV-2 target nucleic acids are NOT DETECTED.  The SARS-CoV-2 RNA is generally detectable in upper and lower respiratory specimens during the acute phase of infection. Negative results do not preclude SARS-CoV-2 infection, do not rule out co-infections with other pathogens, and should not be used as the sole basis for treatment or other patient management decisions. Negative results must be combined with clinical observations, patient history, and epidemiological information. The expected result is Negative.  Fact Sheet for Patients: HairSlick.no  Fact Sheet for Healthcare Providers: quierodirigir.com  This test is not yet approved or cleared by the [place] FDA  and  has been authorized for detection and/or diagnosis of SARS-CoV-2 by FDA under an Emergency Use Authorization (EUA). This EUA will remain  in effect (meaning this test can be used) for the duration of the COVID-19 declaration under Se ction 564(b)(1) of the Act, 21 U.S.C. section 360bbb-3(b)(1), unless the authorization is terminated or revoked sooner.  Performed at [facility], [address], [place], [place] [address]     Procedures/Studies: DG Chest 2 View  Result Date: 11/23/2020 CLINICAL DATA:  Chest pain EXAM: CHEST - 2 VIEW COMPARISON:  05/11/2020 FINDINGS: Stable cardiomegaly. Atherosclerotic calcification of the aortic knob. Streaky right basilar opacity. No pleural effusion or pneumothorax. Advanced arthropathy of the left shoulder. Chronic left-sided rib fractures. IMPRESSION: Streaky right basilar opacity, favor atelectasis. Electronically Signed   By: Duanne Guess D.O.   On: 11/23/2020 11:37   CT Head Wo Contrast  Result Date: 12/02/2020 CLINICAL DATA:  Dizziness, fall backwards EXAM: CT HEAD WITHOUT CONTRAST CT CERVICAL SPINE WITHOUT CONTRAST TECHNIQUE: Multidetector CT imaging of the head and cervical spine was performed following the standard protocol without intravenous contrast. Multiplanar CT image reconstructions of the cervical spine were also generated. COMPARISON:  CT head dated 05/14/2020 FINDINGS: CT HEAD FINDINGS Brain: No evidence of acute infarction, hemorrhage, hydrocephalus, extra-axial collection or mass lesion/mass effect. Mild age related atrophy. Subcortical white matter and periventricular small vessel ischemic changes. Vascular: Mild intracranial atherosclerosis. Skull: Normal. Negative for fracture or focal lesion. Sinuses/Orbits: The visualized paranasal sinuses are essentially clear. The mastoid air cells are unopacified. Other: None. CT CERVICAL SPINE FINDINGS Alignment: Mild straightening of the cervical spine, likely positional. Skull base  and vertebrae: No acute fracture. No primary bone lesion or focal pathologic process. Soft tissues and spinal canal: No prevertebral fluid or swelling. No visible canal hematoma. Disc levels: Mild degenerative changes of the mid cervical spine. Spinal canal is patent. Upper chest: Visualized lung apices are notable for mild biapical pleural-parenchymal scarring. Other: Visualized thyroid is unremarkable. IMPRESSION: No evidence of acute intracranial abnormality. Age related atrophy with small vessel ischemic changes. No evidence of traumatic injury to the cervical spine. Mild degenerative changes. Electronically Signed   By: Charline Bills M.D.   On: 12/02/2020 15:33  CT Cervical Spine Wo Contrast  Result Date: 12/02/2020 CLINICAL DATA:  Dizziness, fall backwards EXAM: CT HEAD WITHOUT CONTRAST CT CERVICAL SPINE WITHOUT CONTRAST TECHNIQUE: Multidetector CT imaging of the head and cervical spine was performed following the standard protocol without intravenous contrast. Multiplanar CT image reconstructions of the cervical spine were also generated. COMPARISON:  CT head dated 05/14/2020 FINDINGS: CT HEAD FINDINGS Brain: No evidence of acute infarction, hemorrhage, hydrocephalus, extra-axial collection or mass lesion/mass effect. Mild age related atrophy. Subcortical white matter and periventricular small vessel ischemic changes. Vascular: Mild intracranial atherosclerosis. Skull: Normal. Negative for fracture or focal lesion. Sinuses/Orbits: The visualized paranasal sinuses are essentially clear. The mastoid air cells are unopacified. Other: None. CT CERVICAL SPINE FINDINGS Alignment: Mild straightening of the cervical spine, likely positional. Skull base and vertebrae: No acute fracture. No primary bone lesion or focal pathologic process. Soft tissues and spinal canal: No prevertebral fluid or swelling. No visible canal hematoma. Disc levels: Mild degenerative changes of the mid cervical spine. Spinal canal is  patent. Upper chest: Visualized lung apices are notable for mild biapical pleural-parenchymal scarring. Other: Visualized thyroid is unremarkable. IMPRESSION: No evidence of acute intracranial abnormality. Age related atrophy with small vessel ischemic changes. No evidence of traumatic injury to the cervical spine. Mild degenerative changes. Electronically Signed   By: Julian Hy M.D.   On: 12/02/2020 15:33   DG HIP PORT UNILAT WITH PELVIS 1V LEFT  Result Date: 12/04/2020 CLINICAL DATA:  Post LEFT total hip arthroplasty EXAM: DG HIP (WITH OR WITHOUT PELVIS) 1V PORT LEFT COMPARISON:  Portable exam 0909 hours compared to 12/02/2020 FINDINGS: Interval resection of LEFT femoral head and fractured femoral neck. LEFT hip prosthesis identified in expected position. Osseous demineralization. Degenerative changes of the RIGHT hip joint. No fracture or dislocation. IMPRESSION: LEFT hip prosthesis without acute complication. Electronically Signed   By: Lavonia Dana M.D.   On: 12/04/2020 09:51   DG HIP UNILAT WITH PELVIS 2-3 VIEWS LEFT  Result Date: 12/02/2020 CLINICAL DATA:  Fall, left hip pain EXAM: DG HIP (WITH OR WITHOUT PELVIS) 2-3V LEFT COMPARISON:  05/19/2020 FINDINGS: Acute fracture of the left femoral neck with slight impaction and minimal varus angulation. No dislocation. Mild degenerative changes of the bilateral hips. Bones are demineralized. Bony pelvis intact. Degenerative disc disease of the lower lumbar spine. IMPRESSION: Acute fracture of the left femoral neck with slight impaction and minimal varus angulation. Electronically Signed   By: Davina Poke D.O.   On: 12/02/2020 15:11    Labs: BNP (last 3 results) Recent Labs    05/12/20 0435  BNP [id number]*   Basic Metabolic Panel: Recent Labs  Lab 12/02/20 1420 12/04/20 0500 12/05/20 0409 12/06/20 0213 12/07/20 0120  NA 142 137 135 137 137  K 3.7 3.7 4.0 4.1 4.3  CL 107 102 100 103 103  CO2 26 24 24 26 26   GLUCOSE 100* 155* 101*  95 111*  BUN 13 16 16 17 18   CREATININE 0.92 0.93 0.84 0.78 0.88  CALCIUM 9.2 8.7* 8.8* 8.7* 8.7*  MG  --  1.8  --   --   --    Liver Function Tests: Recent Labs  Lab 12/02/20 1420  AST 20  ALT 16  ALKPHOS 83  BILITOT 0.6  PROT 6.2*  ALBUMIN 3.6   No results for input(s): LIPASE, AMYLASE in the last 168 hours. No results for input(s): AMMONIA in the last 168 hours. CBC: Recent Labs  Lab 12/02/20 1420 12/04/20 0500 12/05/20 0409  12/06/20 0213 12/07/20 0120  WBC 10.6* 16.7* 13.5* 12.2* 10.3  NEUTROABS 7.4  --   --   --   --   HGB 13.3 11.6* 11.7* 11.2* 11.2*  HCT 42.3 35.1* 36.5 35.3* 35.4*  MCV 94.8 92.1 93.1 93.9 94.7  PLT 261 235 237 244 260   Cardiac Enzymes: No results for input(s): CKTOTAL, CKMB, CKMBINDEX, TROPONINI in the last 168 hours. BNP: Invalid input(s): POCBNP CBG: No results for input(s): GLUCAP in the last 168 hours. D-Dimer No results for input(s): DDIMER in the last 72 hours. Hgb A1c No results for input(s): HGBA1C in the last 72 hours. Lipid Profile No results for input(s): CHOL, HDL, LDLCALC, TRIG, CHOLHDL, LDLDIRECT in the last 72 hours. Thyroid function studies No results for input(s): TSH, T4TOTAL, T3FREE, THYROIDAB in the last 72 hours.  Invalid input(s): FREET3 Anemia work up No results for input(s): VITAMINB12, FOLATE, FERRITIN, TIBC, IRON, RETICCTPCT in the last 72 hours. Urinalysis    Component Value Date/Time   COLORURINE YELLOW 05/21/2020 0927   APPEARANCEUR CLEAR 05/21/2020 0927   LABSPEC 1.009 05/21/2020 0927   PHURINE 6.0 05/21/2020 0927   GLUCOSEU NEGATIVE 05/21/2020 0927   GLUCOSEU NEGATIVE 02/22/2016 0926   HGBUR NEGATIVE 05/21/2020 0927   BILIRUBINUR NEGATIVE 05/21/2020 0927   BILIRUBINUR Negative 02/03/2020 1548   KETONESUR NEGATIVE 05/21/2020 0927   PROTEINUR NEGATIVE 05/21/2020 0927   UROBILINOGEN 0.2 02/03/2020 1548   UROBILINOGEN 0.2 02/22/2016 0926   NITRITE NEGATIVE 05/21/2020 0927   LEUKOCYTESUR NEGATIVE  05/21/2020 0927   Sepsis Labs Invalid input(s): PROCALCITONIN,  WBC,  LACTICIDVEN Microbiology Recent Results (from the past 240 hour(s))  SARS CORONAVIRUS 2 (TAT 6-24 HRS) Nasopharyngeal Nasopharyngeal Swab     Status: None   Collection Time: 12/02/20  8:38 PM   Specimen: Nasopharyngeal Swab  Result Value Ref Range Status   SARS Coronavirus 2 NEGATIVE NEGATIVE Final    Comment: (NOTE) SARS-CoV-2 target nucleic acids are NOT DETECTED.  The SARS-CoV-2 RNA is generally detectable in upper and lower respiratory specimens during the acute phase of infection. Negative results do not preclude SARS-CoV-2 infection, do not rule out co-infections with other pathogens, and should not be used as the sole basis for treatment or other patient management decisions. Negative results must be combined with clinical observations, patient history, and epidemiological information. The expected result is Negative.  Fact Sheet for Patients: HairSlick.nohttps://www.fda.gov/media/138098/download  Fact Sheet for Healthcare Providers: quierodirigir.comhttps://www.fda.gov/media/138095/download  This test is not yet approved or cleared by the [place] FDA and  has been authorized for detection and/or diagnosis of SARS-CoV-2 by FDA under an Emergency Use Authorization (EUA). This EUA will remain  in effect (meaning this test can be used) for the duration of the COVID-19 declaration under Se ction 564(b)(1) of the Act, 21 U.S.C. section 360bbb-3(b)(1), unless the authorization is terminated or revoked sooner.  Performed at [facility], [address] [place], [place] [address]   MRSA PCR Screening     Status: None   Collection Time: 12/02/20 11:47 PM   Specimen: Nasal Mucosa; Nasopharyngeal  Result Value Ref Range Status   MRSA by PCR NEGATIVE NEGATIVE Final    Comment:        The GeneXpert MRSA Assay (FDA approved for NASAL specimens only), is one component of a comprehensive MRSA colonization surveillance  program. It is not intended to diagnose MRSA infection nor to guide or monitor treatment for MRSA infections. Performed at [facility], [address]  [place], [place] [address]   SARS CORONAVIRUS 2 (TAT 6-24 HRS) Nasopharyngeal Nasopharyngeal Swab     Status: None   Collection Time: 12/06/20 11:52 AM   Specimen: Nasopharyngeal Swab  Result Value Ref Range Status   SARS Coronavirus 2 NEGATIVE NEGATIVE Final    Comment: (NOTE) SARS-CoV-2 target nucleic acids are NOT DETECTED.  The SARS-CoV-2 RNA is generally detectable in upper and lower respiratory specimens during the acute phase of infection. Negative results do not preclude SARS-CoV-2 infection, do not rule out co-infections with other pathogens, and should not be used as the sole basis for treatment or other patient management decisions. Negative results must be combined with clinical observations, patient history, and epidemiological information. The expected result is Negative.  Fact Sheet for Patients: SugarRoll.be  Fact Sheet for Healthcare Providers: https://www.woods-mathews.com/  This test is not yet approved or cleared by the [place] FDA and  has been authorized for detection and/or diagnosis of SARS-CoV-2 by FDA under an Emergency Use Authorization (EUA). This EUA will remain  in effect (meaning this test can be used) for the duration of the COVID-19 declaration under Se ction 564(b)(1) of the Act, 21 U.S.C. section 360bbb-3(b)(1), unless the authorization is terminated or revoked sooner.  Performed at [facility], [place] [address] [place], [place] [address]      Time coordinating discharge: 25 minutes  SIGNED: Antonieta Pert, MD  [facility] 12/07/2020, 10:07 AM  If 7PM-7AM, please contact night-coverage [email or web address]

## 2020-12-06 NOTE — TOC Progression Note (Signed)
Transition of Care [facility]) - Progression Note    Patient Details  Name: [name] MRN: [id number] Date of Birth: 11/30/1922  [facility] CM/SW Contact  Sharin Mons, RN Phone Number: 12/06/2020, 1:26 PM  Clinical Narrative:    NCM reviewed SNF bed offer with pt and son. Pt selected [facility] extended bed offer. Insurance authorization pending. Per [facility] admissions SNF bed will be available on tomorrow.  TOC team will continue to monitor and assist with needs...Marland KitchenMarland KitchenMarland Kitchen  Expected Discharge Plan: Skilled Nursing Facility Barriers to Discharge: Continued Medical Work up,Insurance Authorization  Expected Discharge Plan and Services Expected Discharge Plan: [place]   Discharge Planning Services: CM Consult   Living arrangements for the past 2 months: Single Family Home                                       Social Determinants of Health (SDOH) Interventions    Readmission Risk Interventions No flowsheet data found.

## 2020-12-06 NOTE — Progress Notes (Signed)
PROGRESS NOTE    [name]  ZDG:[id number] DOB: August 14, 1923 DOA: 12/02/2020 PCP: [name], MD   Chief Complaint  Patient presents with  . Fall  . Hip Pain    Left   Brief Narrative: 87 year old female with PAF on Eliquis, polymyalgia on chronic prednisone, GERD, hypertension, hypothyroidism, ruptured diverticulitis with colostomy presented after a fall and found to have left hip fracture.  She underwent ORIF by Dr Percell Miller 12/03/20. Postop mobilizing more, working with PT OT PT OT has advised skilled nursing facility, patient lives alone.  Subjective: Alert awake, son is at the bedside resting comfortably no heartburn issues,controlled with medication. Overnight no fever, leukocytosis downtrending.   Assessment & Plan:  Left Hip fracture s/p ORIF by Dr Percell Miller 12/03/20.  Doing well postop, continue PT OT, will need a skilled nursing facility.  Continue DVT prophylaxis with home Eliquis, continue pain control.   Leukocytosis likely reactive in the setting of a fracture, no fever.  Is downtrending.   Recent Labs  Lab 12/02/20 1420 12/04/20 0500 12/05/20 0409 12/06/20 0213  WBC 10.6* 16.7* 13.5* 12.2*   PAF currently in NSR, continue her home  eliquis, Cardizem, nadolol.    Heartburn/indigestion continue on her home Carafate, PPI, Maalox.Patient reports she has had similar episode in the past, and had extensive testing including cardiac test in the emergency room. Of note she is on chronic prednisone which could contribute to gastritis/esophagitis.  Hypertension BP stable on home Cardizem and nadolol. Losartan on hold-can resume upon discharge if bp high.  Hypothyroidism: Continue her Synthroid PMR on chronic prednisone 5 mg. Colostomy status continue routine colostomy care.  Nutrition: Diet Order            Diet regular Room service appropriate? Yes; Fluid consistency: Thin  Diet effective now                 Body mass index is 23.81 kg/m.  DVT  prophylaxis: SCDs Start: 12/02/20 2159 Code Status:   Code Status: Full Code  Family Communication: plan of care discussed with patient at bedside. Son was updated at the bedside yesterday.  Status is: Inpatient Remains inpatient appropriate because:Unsafe d/c plan and Inpatient level of care appropriate due to severity of illness  Dispo: The patient is from: Home              Anticipated d/c is to:SNF              Anticipated d/c date is: Once SNF bed available likely tomorrow              Patient currently Is medically stable for discharge   Difficult to place patient No   Consultants:see note  Procedures:see note  Culture/Microbiology    Component Value Date/Time   SDES  05/21/2020 0927    Urine Performed at [facility], [place] [address], [place], [place] [address]    SPECREQUEST  05/21/2020 4166    NONE Performed at [facility], [place] [address], [place], [place] [address]    CULT  05/21/2020 6010    NO GROWTH Performed at [facility], [place] [address] [place], [place] [address]    REPTSTATUS 05/22/2020 FINAL 05/21/2020 5732    Other culture-see note  Medications: Scheduled Meds: . apixaban  5 mg Oral BID  . bupivacaine liposome  20 mL Infiltration Once  . diltiazem  120 mg Oral Daily  . docusate sodium  100 mg Oral BID  .  gabapentin  300 mg Oral QHS  . levothyroxine  50 mcg Oral QAC breakfast  . nadolol  80 mg Oral Daily  . pantoprazole (PROTONIX) IV  40 mg Intravenous Q12H  . polyethylene glycol  17 g Oral Daily  . predniSONE  5 mg Oral Q lunch  . senna-docusate  1 tablet Oral QHS  . sucralfate  1 g Oral TID with meals  . traMADol  50 mg Oral Q6H   Continuous Infusions: . tranexamic acid      Antimicrobials: Anti-infectives (From admission, onward)   Start     Dose/Rate Route Frequency Ordered Stop   12/03/20 1400  ceFAZolin (ANCEF) IVPB 2g/100 mL premix        2 g 200 mL/hr over 30 Minutes Intravenous Every  6 hours 12/03/20 1118 12/04/20 0300   12/03/20 0600  ceFAZolin (ANCEF) IVPB 2g/100 mL premix        2 g 200 mL/hr over 30 Minutes Intravenous On call to O.R. 12/02/20 2335 12/03/20 0800     Objective: Vitals: Today's Vitals   12/04/20 2006 12/05/20 0815 12/05/20 2011 12/06/20 0357  BP: (!) 156/65 138/75 (!) 153/71 (!) 142/57  Pulse: 71 73 69 64  Resp: 18  17 18   Temp: 98.4 F (36.9 C) 97.7 F (36.5 C) 99 F (37.2 C) 98.6 F (37 C)  TempSrc: Oral Oral Oral Oral  SpO2: 96% 98% 96% 95%  Weight:      Height:      PainSc:  0-No pain 0-No pain    No intake or output data in the 24 hours ending 12/06/20 0824 Filed Weights   12/02/20 1342  Weight: 68.9 kg   Weight change:   Intake/Output from previous day: No intake/output data recorded. Intake/Output this shift: No intake/output data recorded. Filed Weights   12/02/20 1342  Weight: 68.9 kg    Examination: General exam: AAOx3 , NAD, weak appearing. HEENT:Oral mucosa moist, Ear/Nose WNL grossly, dentition normal. Respiratory system: bilaterally clear,no wheezing or crackles,no use of accessory muscle Cardiovascular system: S1 & S2 +, No JVD,. Gastrointestinal system: Abdomen soft, NT,ND, BS+ Nervous System:Alert, awake, moving extremities and grossly nonfocal Extremities: No edema, distal peripheral pulses palpable.  Skin: No rashes,no icterus.  Left Hip surgical site with Aquacel dressing intact MSK: Normal muscle bulk,tone, power  Data Reviewed: I have personally reviewed following labs and imaging studies CBC: Recent Labs  Lab 12/02/20 1420 12/04/20 0500 12/05/20 0409 12/06/20 0213  WBC 10.6* 16.7* 13.5* 12.2*  NEUTROABS 7.4  --   --   --   HGB 13.3 11.6* 11.7* 11.2*  HCT 42.3 35.1* 36.5 35.3*  MCV 94.8 92.1 93.1 93.9  PLT 261 235 237 [id number]   Basic Metabolic Panel: Recent Labs  Lab 11/29/20 1137 12/02/20 1420 12/04/20 0500 12/05/20 0409 12/06/20 0213  NA 140 142 137 135 137  K 4.5 3.7 3.7 4.0 4.1  CL  102 107 102 100 103  CO2 25 26 24 24 26   GLUCOSE 91 100* 155* 101* 95  BUN 14 13 16 16 17   CREATININE 1.15* 0.92 0.93 0.84 0.78  CALCIUM 9.8 9.2 8.7* 8.8* 8.7*  MG  --   --  1.8  --   --    GFR: Estimated Creatinine Clearance: 40.9 mL/min (by C-G formula based on SCr of 0.78 mg/dL). Liver Function Tests: Recent Labs  Lab 12/02/20 1420  AST 20  ALT 16  ALKPHOS 83  BILITOT 0.6  PROT 6.2*  ALBUMIN 3.6  No results for input(s): LIPASE, AMYLASE in the last 168 hours. No results for input(s): AMMONIA in the last 168 hours. Coagulation Profile: Recent Labs  Lab 12/02/20 1420  INR 1.2   Cardiac Enzymes: No results for input(s): CKTOTAL, CKMB, CKMBINDEX, TROPONINI in the last 168 hours. BNP (last 3 results) No results for input(s): PROBNP in the last 8760 hours. HbA1C: No results for input(s): HGBA1C in the last 72 hours. CBG: No results for input(s): GLUCAP in the last 168 hours. Lipid Profile: No results for input(s): CHOL, HDL, LDLCALC, TRIG, CHOLHDL, LDLDIRECT in the last 72 hours. Thyroid Function Tests: No results for input(s): TSH, T4TOTAL, FREET4, T3FREE, THYROIDAB in the last 72 hours. Anemia Panel: No results for input(s): VITAMINB12, FOLATE, FERRITIN, TIBC, IRON, RETICCTPCT in the last 72 hours. Sepsis Labs: No results for input(s): PROCALCITON, LATICACIDVEN in the last 168 hours.  Recent Results (from the past 240 hour(s))  SARS CORONAVIRUS 2 (TAT 6-24 HRS) Nasopharyngeal Nasopharyngeal Swab     Status: None   Collection Time: 12/02/20  8:38 PM   Specimen: Nasopharyngeal Swab  Result Value Ref Range Status   SARS Coronavirus 2 NEGATIVE NEGATIVE Final    Comment: (NOTE) SARS-CoV-2 target nucleic acids are NOT DETECTED.  The SARS-CoV-2 RNA is generally detectable in upper and lower respiratory specimens during the acute phase of infection. Negative results do not preclude SARS-CoV-2 infection, do not rule out co-infections with other pathogens, and should  not be used as the sole basis for treatment or other patient management decisions. Negative results must be combined with clinical observations, patient history, and epidemiological information. The expected result is Negative.  Fact Sheet for Patients: SugarRoll.be  Fact Sheet for Healthcare Providers: https://www.woods-mathews.com/  This test is not yet approved or cleared by the [place] FDA and  has been authorized for detection and/or diagnosis of SARS-CoV-2 by FDA under an Emergency Use Authorization (EUA). This EUA will remain  in effect (meaning this test can be used) for the duration of the COVID-19 declaration under Se ction 564(b)(1) of the Act, 21 U.S.C. section 360bbb-3(b)(1), unless the authorization is terminated or revoked sooner.  Performed at [facility], [place] [address] [place], [place] [address]   MRSA PCR Screening     Status: None   Collection Time: 12/02/20 11:47 PM   Specimen: Nasal Mucosa; Nasopharyngeal  Result Value Ref Range Status   MRSA by PCR NEGATIVE NEGATIVE Final    Comment:        The GeneXpert MRSA Assay (FDA approved for NASAL specimens only), is one component of a comprehensive MRSA colonization surveillance program. It is not intended to diagnose MRSA infection nor to guide or monitor treatment for MRSA infections. Performed at [facility], [place] [address] [place], [place] [address]      Radiology Studies: DG HIP PORT UNILAT WITH PELVIS 1V LEFT  Result Date: 12/04/2020 CLINICAL DATA:  Post LEFT total hip arthroplasty EXAM: DG HIP (WITH OR WITHOUT PELVIS) 1V PORT LEFT COMPARISON:  Portable exam 0909 hours compared to 12/02/2020 FINDINGS: Interval resection of LEFT femoral head and fractured femoral neck. LEFT hip prosthesis identified in expected position. Osseous demineralization. Degenerative changes of the RIGHT hip joint. No fracture or dislocation. IMPRESSION: LEFT hip  prosthesis without acute complication. Electronically Signed   By: Lavonia Dana M.D.   On: 12/04/2020 09:51     LOS: 4 days   Antonieta Pert, MD [facility]  12/06/2020, 8:24 AM

## 2020-12-06 NOTE — TOC CAGE-AID Note (Signed)
Transition of Care [facility]) - CAGE-AID Screening   Patient Details  Name: [name] MRN: [id number] Date of Birth: [birth year]   Contact:    Dia Crawford, RN Phone Number: 12/06/2020, 12:23 PM   Clinical Narrative: Patient denies etoh/drug or nicotine usage, no resources provided. Son at bedside.   CAGE-AID Screening:    Have You Ever Felt You Ought to Cut Down on Your Drinking or Drug Use?: No Have People Annoyed You By Critizing Your Drinking Or Drug Use?: No Have You Felt Bad Or Guilty About Your Drinking Or Drug Use?: No Have You Ever Had a Drink or Used Drugs First Thing In The Morning to Steady Your Nerves or to Get Rid of a Hangover?: No CAGE-AID Score: 0  Substance Abuse Education Offered: No

## 2020-12-06 NOTE — Care Management Important Message (Signed)
Important Message  Patient Details  Name: [name] MRN: [id number] Date of Birth: 1923-09-08   Medicare Important Message Given:  Yes - Important Message mailed due to current National Emergency   Verbal consent obtained due to current National Emergency  Relationship to patient: Child Contact Name: Jana Half Call Date: 12/06/20  Time: 1521 Phone: [phone] Outcome: Spoke with contact Important Message mailed to: Patient address on file    Delorse Lek 12/06/2020, 3:21 PM

## 2020-12-06 NOTE — Progress Notes (Signed)
Occupational Therapy Treatment Patient Details Name: [name] MRN: [id number] DOB: [birth year] Today's Date: 12/06/2020    History of present illness Pt is a 87 y.o. female admitted 12/02/20 after fall sustaining L femoral neck fx. S/p L hip hemiarthroplasty (posterior approach) 1/23. PMH includes DVT, HTN, PVD, back sx, colostomy.   OT comments  Pt making good progress with functional goals. Pt very pleasant, motivated ad eager to work with therapy. OT will continue to follow acutely to maximize level of function and safety  Follow Up Recommendations  SNF    Equipment Recommendations  Other (comment) (TBD at SNF)    Recommendations for Other Services      Precautions / Restrictions Precautions Precautions: Posterior Hip;Fall;Other (comment) Precaution Comments: Colostomy bag, near syncope with PT on 12/05/20 Restrictions Weight Bearing Restrictions: Yes LLE Weight Bearing: Weight bearing as tolerated       Mobility Bed Mobility Overal bed mobility: Needs Assistance Bed Mobility: Supine to Sit     Supine to sit: Min assist     General bed mobility comments: min A for LE mgt  Transfers Overall transfer level: Needs assistance Equipment used: Rolling walker (2 wheeled) Transfers: Sit to/from Stand Sit to Stand: Min assist         General transfer comment: pt ambulated to bathroom, door and to window with RW    Balance Overall balance assessment: Needs assistance   Sitting balance-Leahy Scale: Good     Standing balance support: Bilateral upper extremity supported;During functional activity Standing balance-Leahy Scale: Poor                             ADL either performed or assessed with clinical judgement   ADL Overall ADL's : Needs assistance/impaired     Grooming: Wash/dry hands;Wash/dry face;Min guard;Standing   Upper Body Bathing: Set up;Supervision/ safety;Sitting Upper Body Bathing Details (indicate cue type and reason):  simulated Lower Body Bathing: Moderate assistance;Sitting/lateral leans Lower Body Bathing Details (indicate cue type and reason): simualated Upper Body Dressing : Set up;Supervision/safety;Sitting Upper Body Dressing Details (indicate cue type and reason): donned clean gown seated     Toilet Transfer: Minimal assistance;RW;BSC;Cueing for safety;Ambulation   Toileting- Clothing Manipulation and Hygiene: Minimal assistance;Sit to/from stand       Functional mobility during ADLs: Minimal assistance;Rolling walker       Vision Baseline Vision/History: Wears glasses Patient Visual Report: No change from baseline     Perception     Praxis      Cognition Arousal/Alertness: Awake/alert Behavior During Therapy: WFL for tasks assessed/performed Overall Cognitive Status: Within Functional Limits for tasks assessed                                          Exercises     Shoulder Instructions       General Comments      Pertinent Vitals/ Pain       Pain Assessment: Faces Pain Score: 3  Pain Location: LLE Pain Descriptors / Indicators: Discomfort;Grimacing;Guarding Pain Intervention(s): Monitored during session;Repositioned  Home Living                                          Prior Functioning/Environment  Frequency  Min 2X/week        Progress Toward Goals  OT Goals(current goals can now be found in the care plan section)  Progress towards OT goals: Progressing toward goals  Acute Rehab OT Goals Patient Stated Goal: Wants to do post-acute rehab at SNF before return home  Plan Discharge plan remains appropriate    Co-evaluation                 AM-PAC OT "6 Clicks" Daily Activity     Outcome Measure   Help from another person eating meals?: None Help from another person taking care of personal grooming?: A Little Help from another person toileting, which includes using toliet, bedpan, or urinal?:  A Little Help from another person bathing (including washing, rinsing, drying)?: A Lot Help from another person to put on and taking off regular upper body clothing?: A Little Help from another person to put on and taking off regular lower body clothing?: A Lot 6 Click Score: 17    End of Session Equipment Utilized During Treatment: Gait belt;Rolling walker;Other (comment) (BSC)  OT Visit Diagnosis: Unsteadiness on feet (R26.81);Other abnormalities of gait and mobility (R26.89);History of falling (Z91.81);Muscle weakness (generalized) (M62.81);Pain Pain - Right/Left: Left Pain - part of body: Leg   Activity Tolerance Patient tolerated treatment well   Patient Left in chair;with call bell/phone within reach;with chair alarm set   Nurse Communication          Time: 1062-6948 OT Time Calculation (min): 26 min  Charges: OT General Charges $OT Visit: 1 Visit OT Treatments $Self Care/Home Management : 8-22 mins $Therapeutic Activity: 8-22 mins     Britt Bottom 12/06/2020, 4:25 PM

## 2020-12-07 DIAGNOSIS — R262 Difficulty in walking, not elsewhere classified: Secondary | ICD-10-CM | POA: Diagnosis not present

## 2020-12-07 DIAGNOSIS — Z933 Colostomy status: Secondary | ICD-10-CM | POA: Diagnosis not present

## 2020-12-07 DIAGNOSIS — R279 Unspecified lack of coordination: Secondary | ICD-10-CM | POA: Diagnosis not present

## 2020-12-07 DIAGNOSIS — S72002A Fracture of unspecified part of neck of left femur, initial encounter for closed fracture: Secondary | ICD-10-CM | POA: Diagnosis not present

## 2020-12-07 DIAGNOSIS — I480 Paroxysmal atrial fibrillation: Secondary | ICD-10-CM | POA: Diagnosis not present

## 2020-12-07 DIAGNOSIS — D649 Anemia, unspecified: Secondary | ICD-10-CM | POA: Diagnosis not present

## 2020-12-07 DIAGNOSIS — K2100 Gastro-esophageal reflux disease with esophagitis, without bleeding: Secondary | ICD-10-CM | POA: Diagnosis not present

## 2020-12-07 DIAGNOSIS — E038 Other specified hypothyroidism: Secondary | ICD-10-CM | POA: Diagnosis not present

## 2020-12-07 DIAGNOSIS — R2689 Other abnormalities of gait and mobility: Secondary | ICD-10-CM | POA: Diagnosis not present

## 2020-12-07 DIAGNOSIS — R0789 Other chest pain: Secondary | ICD-10-CM | POA: Diagnosis not present

## 2020-12-07 DIAGNOSIS — M353 Polymyalgia rheumatica: Secondary | ICD-10-CM | POA: Diagnosis not present

## 2020-12-07 DIAGNOSIS — I509 Heart failure, unspecified: Secondary | ICD-10-CM | POA: Diagnosis not present

## 2020-12-07 DIAGNOSIS — M7062 Trochanteric bursitis, left hip: Secondary | ICD-10-CM | POA: Diagnosis not present

## 2020-12-07 DIAGNOSIS — E039 Hypothyroidism, unspecified: Secondary | ICD-10-CM | POA: Diagnosis not present

## 2020-12-07 DIAGNOSIS — R5381 Other malaise: Secondary | ICD-10-CM | POA: Diagnosis not present

## 2020-12-07 DIAGNOSIS — K219 Gastro-esophageal reflux disease without esophagitis: Secondary | ICD-10-CM | POA: Diagnosis not present

## 2020-12-07 DIAGNOSIS — R296 Repeated falls: Secondary | ICD-10-CM | POA: Diagnosis not present

## 2020-12-07 DIAGNOSIS — S7292XS Unspecified fracture of left femur, sequela: Secondary | ICD-10-CM | POA: Diagnosis not present

## 2020-12-07 DIAGNOSIS — Z9181 History of falling: Secondary | ICD-10-CM | POA: Diagnosis not present

## 2020-12-07 DIAGNOSIS — I10 Essential (primary) hypertension: Secondary | ICD-10-CM | POA: Diagnosis not present

## 2020-12-07 DIAGNOSIS — Z4789 Encounter for other orthopedic aftercare: Secondary | ICD-10-CM | POA: Diagnosis not present

## 2020-12-07 DIAGNOSIS — S72002D Fracture of unspecified part of neck of left femur, subsequent encounter for closed fracture with routine healing: Secondary | ICD-10-CM | POA: Diagnosis not present

## 2020-12-07 DIAGNOSIS — H8190 Unspecified disorder of vestibular function, unspecified ear: Secondary | ICD-10-CM | POA: Diagnosis not present

## 2020-12-07 DIAGNOSIS — R2681 Unsteadiness on feet: Secondary | ICD-10-CM | POA: Diagnosis not present

## 2020-12-07 DIAGNOSIS — H8193 Unspecified disorder of vestibular function, bilateral: Secondary | ICD-10-CM | POA: Diagnosis not present

## 2020-12-07 DIAGNOSIS — I951 Orthostatic hypotension: Secondary | ICD-10-CM | POA: Diagnosis not present

## 2020-12-07 DIAGNOSIS — M6281 Muscle weakness (generalized): Secondary | ICD-10-CM | POA: Diagnosis not present

## 2020-12-07 DIAGNOSIS — K625 Hemorrhage of anus and rectum: Secondary | ICD-10-CM | POA: Diagnosis not present

## 2020-12-07 LAB — BASIC METABOLIC PANEL
Anion gap: 8 (ref 5–15)
BUN: 18 mg/dL (ref 8–23)
CO2: 26 mmol/L (ref 22–32)
Calcium: 8.7 mg/dL — ABNORMAL LOW (ref 8.9–10.3)
Chloride: 103 mmol/L (ref 98–111)
Creatinine, Ser: 0.88 mg/dL (ref 0.44–1.00)
GFR, Estimated: >60 mL/min (ref >60)
Glucose, Bld: 111 mg/dL — ABNORMAL HIGH (ref 70–99)
Potassium: 4.3 mmol/L (ref 3.5–5.1)
Sodium: 137 mmol/L (ref 135–145)

## 2020-12-07 LAB — CBC
HCT: 35.4 % — ABNORMAL LOW (ref 36.0–46.0)
Hemoglobin: 11.2 g/dL — ABNORMAL LOW (ref 12.0–15.0)
MCH: 29.9 pg (ref 26.0–34.0)
MCHC: 31.6 g/dL (ref 30.0–36.0)
MCV: 94.7 fL (ref 80.0–100.0)
Platelets: 260 10*3/uL (ref 150–400)
RBC: 3.74 MIL/uL — ABNORMAL LOW (ref 3.87–5.11)
RDW: 14.8 % (ref 11.5–15.5)
WBC: 10.3 10*3/uL (ref 4.0–10.5)
nRBC: 0 % (ref 0.0–0.2)

## 2020-12-07 MED ORDER — BISACODYL 10 MG RE SUPP: 10.0000 mg | Freq: Every day | RECTAL | 0 refills | Status: DC | PRN

## 2020-12-07 MED ORDER — SENNOSIDES-DOCUSATE SODIUM 8.6-50 MG PO TABS: 1.0000 | ORAL_TABLET | Freq: Every evening | ORAL | Status: DC | PRN

## 2020-12-07 MED ORDER — PREDNISONE 1 MG PO TABS: 4.0000 mg | ORAL_TABLET | Freq: Every day | ORAL | Status: DC

## 2020-12-07 MED ORDER — ALUM & MAG HYDROXIDE-SIMETH 200-200-20 MG/5ML PO SUSP: 30.0000 mL | Freq: Four times a day (QID) | ORAL | 0 refills | Status: DC | PRN

## 2020-12-07 NOTE — Plan of Care (Signed)
  Problem: Activity: Goal: Ability to avoid complications of mobility impairment will improve Outcome: Progressing Goal: Ability to tolerate increased activity will improve Outcome: Progressing   Problem: Respiratory: Goal: Ability to maintain a clear airway will improve Outcome: Progressing   Problem: Pain Management: Goal: Pain level will decrease Outcome: Progressing   Problem: Activity: Goal: Risk for activity intolerance will decrease Outcome: Progressing   Problem: Nutrition: Goal: Adequate nutrition will be maintained Outcome: Progressing

## 2020-12-07 NOTE — Progress Notes (Signed)
   12/07/20 0324  Vitals  BP (!) 186/79   Blood pressure elevated this morning. Went in to check on patient and she said that she was fine and not in any pain. No signs or symptoms of distress. Will recheck BP closer to shift change.

## 2020-12-07 NOTE — Progress Notes (Signed)
Physical Therapy Treatment Patient Details Name: [name] MRN: [id number] DOB: [birth year] Today's Date: 12/07/2020    History of Present Illness Pt is a 87 y.o. female admitted 12/02/20 after fall sustaining L femoral neck fx. S/p L hip hemiarthroplasty (posterior approach) 1/23. PMH includes DVT, HTN, PVD, back sx, colostomy.   PT Comments    Pt progressing with mobility, remains limited by symptomatic (+) orthostatic hypotension with upright activity (see values below). Also limited by generalized weakness, pain, decreased activity tolerance and impaired balance strategies. Pt remains motivated to participate and regain PLOF. Hopeful for d/c to SNF today.  Orthostatic BPs Supine 154/69  Sitting 111/63  Standing 103/55  Standing after 2 min 99/57  Sitting in recliner 151/67  Post-standing at sink ADLs 115/67  Sitting in recliner 124/67     Follow Up Recommendations  SNF;Supervision for mobility/OOB     Equipment Recommendations  None recommended by PT    Recommendations for Other Services       Precautions / Restrictions Precautions Precautions: Posterior Hip;Fall;Other (comment) Precaution Comments: Colostomy bag; near syncope with PT on 12/05/20, still (+) orthostatic 12/27 Restrictions Weight Bearing Restrictions: Yes LLE Weight Bearing: Weight bearing as tolerated    Mobility  Bed Mobility Overal bed mobility: Needs Assistance Bed Mobility: Supine to Sit     Supine to sit: Supervision;HOB elevated     General bed mobility comments: Increased time and effort; c/o dizziness upon sitting  Transfers Overall transfer level: Needs assistance Equipment used: Rolling walker (2 wheeled) Transfers: Sit to/from Stand Sit to Stand: Min assist         General transfer comment: Multiple sit<>stands from EOB, recliner, BSC (in front of sink) to RW with minA for stability; cues to minimize forward hip flexion in order to maintain posterior hip  precuautions  Ambulation/Gait Ambulation/Gait assistance: Min guard Gait Distance (Feet): 20 Feet Assistive device: Rolling walker (2 wheeled) Gait Pattern/deviations: Step-to pattern;Decreased weight shift to right;Trunk flexed;Antalgic Gait velocity: Decreasesd   General Gait Details: Amb 10' to sink and 10' back to recliner with RW and close min guard; pt with c/o dizziness requiring seated rest to recover, (+) hypotension, further distance limited by this; demonstrated turning R/L with RW while maintaining no L hip ADD/IR   Stairs             Wheelchair Mobility    Modified Rankin (Stroke Patients Only)       Balance Overall balance assessment: Needs assistance   Sitting balance-Leahy Scale: Good Sitting balance - Comments: Able to perform sitting ADL tasks at sink in unsupported sitting (brushing teeth, washing face); cues to not flex hips >/90'     Standing balance-Leahy Scale: Poor Standing balance comment: Reliant on at least single UE support while performing standing ADL tasks at sink (brushing teeth)                            Cognition Arousal/Alertness: Awake/alert Behavior During Therapy: WFL for tasks assessed/performed Overall Cognitive Status: Within Functional Limits for tasks assessed                                        Exercises General Exercises - Lower Extremity Ankle Circles/Pumps: AROM;Both;Seated Long Arc Quad: AROM;Both;Seated    General Comments General comments (skin integrity, edema, etc.): Persistent orthostatic hypotension with standing this session (recovers in  sitting); SpO2 96% on RA, HR 69      Pertinent Vitals/Pain Pain Assessment: Faces Faces Pain Scale: Hurts little more Pain Location: LLE Pain Descriptors / Indicators: Discomfort;Grimacing;Guarding Pain Intervention(s): Monitored during session;Repositioned    Home Living                      Prior Function            PT  Goals (current goals can now be found in the care plan section) Progress towards PT goals: Progressing toward goals    Frequency    Min 3X/week      PT Plan Current plan remains appropriate    Co-evaluation              AM-PAC PT "6 Clicks" Mobility   Outcome Measure  Help needed turning from your back to your side while in a flat bed without using bedrails?: A Little Help needed moving from lying on your back to sitting on the side of a flat bed without using bedrails?: A Little Help needed moving to and from a bed to a chair (including a wheelchair)?: A Little Help needed standing up from a chair using your arms (e.g., wheelchair or bedside chair)?: A Little Help needed to walk in hospital room?: A Little Help needed climbing 3-5 steps with a railing? : A Lot 6 Click Score: 17    End of Session Equipment Utilized During Treatment: Gait belt Activity Tolerance: Patient tolerated treatment well;Treatment limited secondary to medical complications (Comment) Patient left: in chair;with call bell/phone within reach;with chair alarm set Nurse Communication: Mobility status PT Visit Diagnosis: Other abnormalities of gait and mobility (R26.89);Muscle weakness (generalized) (M62.81);Pain Pain - Right/Left: Left Pain - part of body: Hip     Time: 3235-5732 PT Time Calculation (min) (ACUTE ONLY): 33 min  Charges:  $Gait Training: 8-22 mins $Therapeutic Activity: 8-22 mins                    Mabeline Caras, PT, DPT Acute Rehabilitation Services  Pager [phone] Office [place] 12/07/2020, 10:04 AM

## 2020-12-07 NOTE — Progress Notes (Signed)
Reported called to [facility], given to [place]. All questions answered. Discharge summary placed in drawer.

## 2020-12-07 NOTE — TOC Transition Note (Addendum)
Transition of Care [facility]) - CM/SW Discharge Note   Patient Details  Name: [name] MRN: [id number] Date of Birth: 06/26/1923  [facility] CM/SW Contact:  Sharin Mons, RN Phone Number: 12/07/2020, 10:37 AM   Clinical Narrative:    Patient will DC to: [facility] Anticipated DC date: 12/07/2020 Family notified: yes, son Transport by: Corey Harold      - S/p L hip hemiarthroplasty (posterior approach) 1/23. PMH includes DVT, HTN, PVD, back sx, colostomy  Per MD patient ready for DC today. RN, patient, patient's family, and facility notified of DC. Discharge Summary and FL2 sent to facility. RN to call report prior to discharge [phone]). DC packet on chart. [facility] transportationNurse, children's transport) @ [phone] called and requested transportation to [facility]. Per Monserat,  pick up time will be 3:00 pm. Pt's room #  @ [facility], [place].  Dashawn Golda (Son)    [phone]      RNCM will sign off for now as intervention is no longer needed. Please consult [place] again if new needs arise.    Final next level of care: [facility] ([place]) Barriers to Discharge: No Barriers Identified   Patient Goals and CMS Choice Patient states their goals for this hospitalization and ongoing recovery are:: To get better and go home CMS Medicare.gov Compare Post Acute Care list provided to:: Patient    Discharge Placement                       Discharge Plan and Services   Discharge Planning Services: CM Consult                                 Social Determinants of Health (SDOH) Interventions     Readmission Risk Interventions No flowsheet data found.

## 2020-12-07 NOTE — Plan of Care (Signed)
Patient is s/p left hemiarthroplasty on 1/23. No pain at this time; says her scheduled Tramadol works for her just fine. Dsg to left hip clean dry and intact. Patient refuses SCDs but is on Eliquis so that will help with VTE prophylaxis also. Discharge plan is to [facility] possibly tomorrow. Will continue to monitor and continue current POC.

## 2020-12-08 DIAGNOSIS — R296 Repeated falls: Secondary | ICD-10-CM | POA: Diagnosis not present

## 2020-12-08 DIAGNOSIS — Z933 Colostomy status: Secondary | ICD-10-CM | POA: Diagnosis not present

## 2020-12-08 DIAGNOSIS — K219 Gastro-esophageal reflux disease without esophagitis: Secondary | ICD-10-CM | POA: Diagnosis not present

## 2020-12-08 DIAGNOSIS — D649 Anemia, unspecified: Secondary | ICD-10-CM | POA: Diagnosis not present

## 2020-12-08 DIAGNOSIS — S72002A Fracture of unspecified part of neck of left femur, initial encounter for closed fracture: Secondary | ICD-10-CM | POA: Diagnosis not present

## 2020-12-08 DIAGNOSIS — E039 Hypothyroidism, unspecified: Secondary | ICD-10-CM | POA: Diagnosis not present

## 2020-12-08 DIAGNOSIS — M6281 Muscle weakness (generalized): Secondary | ICD-10-CM | POA: Diagnosis not present

## 2020-12-08 DIAGNOSIS — I509 Heart failure, unspecified: Secondary | ICD-10-CM | POA: Diagnosis not present

## 2020-12-08 DIAGNOSIS — R0789 Other chest pain: Secondary | ICD-10-CM | POA: Diagnosis not present

## 2020-12-08 DIAGNOSIS — R2681 Unsteadiness on feet: Secondary | ICD-10-CM | POA: Diagnosis not present

## 2020-12-08 DIAGNOSIS — K2100 Gastro-esophageal reflux disease with esophagitis, without bleeding: Secondary | ICD-10-CM | POA: Diagnosis not present

## 2020-12-08 DIAGNOSIS — I10 Essential (primary) hypertension: Secondary | ICD-10-CM | POA: Diagnosis not present

## 2020-12-08 DIAGNOSIS — I480 Paroxysmal atrial fibrillation: Secondary | ICD-10-CM | POA: Diagnosis not present

## 2020-12-08 DIAGNOSIS — I951 Orthostatic hypotension: Secondary | ICD-10-CM | POA: Diagnosis not present

## 2020-12-08 DIAGNOSIS — S7292XS Unspecified fracture of left femur, sequela: Secondary | ICD-10-CM | POA: Diagnosis not present

## 2020-12-08 DIAGNOSIS — M353 Polymyalgia rheumatica: Secondary | ICD-10-CM | POA: Diagnosis not present

## 2020-12-10 NOTE — Assessment & Plan Note (Signed)
BP is well controlled today since addition of Losartan 50mg  daily.  She will continue the Losartan in addition to her Diltiazem 120mg  daily, Nadolol 80mg  daily.  She reports her dizziness/light headedness has resolved w/ improved BP.  Will continue to follow.

## 2020-12-11 DIAGNOSIS — I509 Heart failure, unspecified: Secondary | ICD-10-CM | POA: Diagnosis not present

## 2020-12-11 DIAGNOSIS — M6281 Muscle weakness (generalized): Secondary | ICD-10-CM | POA: Diagnosis not present

## 2020-12-11 DIAGNOSIS — R2681 Unsteadiness on feet: Secondary | ICD-10-CM | POA: Diagnosis not present

## 2020-12-11 DIAGNOSIS — K219 Gastro-esophageal reflux disease without esophagitis: Secondary | ICD-10-CM | POA: Diagnosis not present

## 2020-12-11 DIAGNOSIS — D649 Anemia, unspecified: Secondary | ICD-10-CM | POA: Diagnosis not present

## 2020-12-11 DIAGNOSIS — M353 Polymyalgia rheumatica: Secondary | ICD-10-CM | POA: Diagnosis not present

## 2020-12-11 DIAGNOSIS — Z933 Colostomy status: Secondary | ICD-10-CM | POA: Diagnosis not present

## 2020-12-11 DIAGNOSIS — S7292XS Unspecified fracture of left femur, sequela: Secondary | ICD-10-CM | POA: Diagnosis not present

## 2020-12-11 DIAGNOSIS — R296 Repeated falls: Secondary | ICD-10-CM | POA: Diagnosis not present

## 2020-12-12 DIAGNOSIS — H8190 Unspecified disorder of vestibular function, unspecified ear: Secondary | ICD-10-CM | POA: Diagnosis not present

## 2020-12-12 DIAGNOSIS — I10 Essential (primary) hypertension: Secondary | ICD-10-CM | POA: Diagnosis not present

## 2020-12-12 DIAGNOSIS — S72002D Fracture of unspecified part of neck of left femur, subsequent encounter for closed fracture with routine healing: Secondary | ICD-10-CM | POA: Diagnosis not present

## 2020-12-12 DIAGNOSIS — K625 Hemorrhage of anus and rectum: Secondary | ICD-10-CM | POA: Diagnosis not present

## 2020-12-20 DIAGNOSIS — K625 Hemorrhage of anus and rectum: Secondary | ICD-10-CM | POA: Diagnosis not present

## 2020-12-20 DIAGNOSIS — S72002D Fracture of unspecified part of neck of left femur, subsequent encounter for closed fracture with routine healing: Secondary | ICD-10-CM | POA: Diagnosis not present

## 2020-12-20 DIAGNOSIS — E038 Other specified hypothyroidism: Secondary | ICD-10-CM | POA: Diagnosis not present

## 2020-12-20 DIAGNOSIS — H8193 Unspecified disorder of vestibular function, bilateral: Secondary | ICD-10-CM | POA: Diagnosis not present

## 2020-12-20 DIAGNOSIS — K219 Gastro-esophageal reflux disease without esophagitis: Secondary | ICD-10-CM | POA: Diagnosis not present

## 2020-12-20 DIAGNOSIS — R0789 Other chest pain: Secondary | ICD-10-CM | POA: Diagnosis not present

## 2020-12-22 DIAGNOSIS — M7062 Trochanteric bursitis, left hip: Secondary | ICD-10-CM | POA: Diagnosis not present

## 2020-12-23 DIAGNOSIS — I10 Essential (primary) hypertension: Secondary | ICD-10-CM | POA: Diagnosis not present

## 2020-12-23 DIAGNOSIS — M6281 Muscle weakness (generalized): Secondary | ICD-10-CM | POA: Diagnosis not present

## 2020-12-23 DIAGNOSIS — S7292XA Unspecified fracture of left femur, initial encounter for closed fracture: Secondary | ICD-10-CM | POA: Diagnosis not present

## 2020-12-23 DIAGNOSIS — S7292XS Unspecified fracture of left femur, sequela: Secondary | ICD-10-CM | POA: Diagnosis not present

## 2020-12-23 DIAGNOSIS — Z9181 History of falling: Secondary | ICD-10-CM | POA: Diagnosis not present

## 2020-12-23 DIAGNOSIS — R2689 Other abnormalities of gait and mobility: Secondary | ICD-10-CM | POA: Diagnosis not present

## 2020-12-26 ENCOUNTER — Telehealth: Payer: Self-pay | Admitting: Family Medicine

## 2020-12-26 NOTE — Telephone Encounter (Signed)
Referral from [facility] for physical therapy and occupational therapy.  Will Dr. Birdie Riddle be willing to sign the home health orders after they see her on Friday.  Please Advise.

## 2020-12-27 NOTE — Telephone Encounter (Signed)
Referral from [facility] for physical and occupational therapy. Would like order for therapy after she has been seen on Friday.

## 2020-12-27 NOTE — Telephone Encounter (Signed)
I will be happy to sign the orders

## 2020-12-27 NOTE — Telephone Encounter (Signed)
Spoke with Estill Bamberg at [place] at [facility] to inform her that signing the forms would not be a problem. She stated that once they got everything together they will fax over for you to sign.

## 2020-12-29 DIAGNOSIS — I480 Paroxysmal atrial fibrillation: Secondary | ICD-10-CM | POA: Diagnosis not present

## 2020-12-29 DIAGNOSIS — I110 Hypertensive heart disease with heart failure: Secondary | ICD-10-CM | POA: Diagnosis not present

## 2020-12-29 DIAGNOSIS — M353 Polymyalgia rheumatica: Secondary | ICD-10-CM | POA: Diagnosis not present

## 2020-12-29 DIAGNOSIS — I739 Peripheral vascular disease, unspecified: Secondary | ICD-10-CM | POA: Diagnosis not present

## 2020-12-29 DIAGNOSIS — S72002D Fracture of unspecified part of neck of left femur, subsequent encounter for closed fracture with routine healing: Secondary | ICD-10-CM | POA: Diagnosis not present

## 2020-12-29 DIAGNOSIS — I951 Orthostatic hypotension: Secondary | ICD-10-CM | POA: Diagnosis not present

## 2020-12-29 DIAGNOSIS — I5032 Chronic diastolic (congestive) heart failure: Secondary | ICD-10-CM | POA: Diagnosis not present

## 2020-12-29 DIAGNOSIS — G629 Polyneuropathy, unspecified: Secondary | ICD-10-CM | POA: Diagnosis not present

## 2020-12-29 DIAGNOSIS — K2100 Gastro-esophageal reflux disease with esophagitis, without bleeding: Secondary | ICD-10-CM | POA: Diagnosis not present

## 2021-01-01 DIAGNOSIS — S7292XS Unspecified fracture of left femur, sequela: Secondary | ICD-10-CM | POA: Diagnosis not present

## 2021-01-01 DIAGNOSIS — I10 Essential (primary) hypertension: Secondary | ICD-10-CM | POA: Diagnosis not present

## 2021-01-01 DIAGNOSIS — Z9181 History of falling: Secondary | ICD-10-CM | POA: Diagnosis not present

## 2021-01-01 DIAGNOSIS — S7292XA Unspecified fracture of left femur, initial encounter for closed fracture: Secondary | ICD-10-CM | POA: Diagnosis not present

## 2021-01-01 DIAGNOSIS — M6281 Muscle weakness (generalized): Secondary | ICD-10-CM | POA: Diagnosis not present

## 2021-01-01 DIAGNOSIS — R2689 Other abnormalities of gait and mobility: Secondary | ICD-10-CM | POA: Diagnosis not present

## 2021-01-03 DIAGNOSIS — M353 Polymyalgia rheumatica: Secondary | ICD-10-CM | POA: Diagnosis not present

## 2021-01-03 DIAGNOSIS — S72002D Fracture of unspecified part of neck of left femur, subsequent encounter for closed fracture with routine healing: Secondary | ICD-10-CM | POA: Diagnosis not present

## 2021-01-03 DIAGNOSIS — I739 Peripheral vascular disease, unspecified: Secondary | ICD-10-CM | POA: Diagnosis not present

## 2021-01-03 DIAGNOSIS — G629 Polyneuropathy, unspecified: Secondary | ICD-10-CM | POA: Diagnosis not present

## 2021-01-03 DIAGNOSIS — I951 Orthostatic hypotension: Secondary | ICD-10-CM | POA: Diagnosis not present

## 2021-01-03 DIAGNOSIS — I480 Paroxysmal atrial fibrillation: Secondary | ICD-10-CM | POA: Diagnosis not present

## 2021-01-03 DIAGNOSIS — I5032 Chronic diastolic (congestive) heart failure: Secondary | ICD-10-CM | POA: Diagnosis not present

## 2021-01-03 DIAGNOSIS — I110 Hypertensive heart disease with heart failure: Secondary | ICD-10-CM | POA: Diagnosis not present

## 2021-01-03 DIAGNOSIS — K2100 Gastro-esophageal reflux disease with esophagitis, without bleeding: Secondary | ICD-10-CM | POA: Diagnosis not present

## 2021-01-05 ENCOUNTER — Telehealth (INDEPENDENT_AMBULATORY_CARE_PROVIDER_SITE_OTHER): Payer: Medicare PPO | Admitting: Family Medicine

## 2021-01-05 DIAGNOSIS — R109 Unspecified abdominal pain: Secondary | ICD-10-CM | POA: Diagnosis not present

## 2021-01-05 DIAGNOSIS — K2100 Gastro-esophageal reflux disease with esophagitis, without bleeding: Secondary | ICD-10-CM | POA: Diagnosis not present

## 2021-01-05 DIAGNOSIS — G629 Polyneuropathy, unspecified: Secondary | ICD-10-CM | POA: Diagnosis not present

## 2021-01-05 DIAGNOSIS — M546 Pain in thoracic spine: Secondary | ICD-10-CM

## 2021-01-05 DIAGNOSIS — I951 Orthostatic hypotension: Secondary | ICD-10-CM | POA: Diagnosis not present

## 2021-01-05 DIAGNOSIS — I110 Hypertensive heart disease with heart failure: Secondary | ICD-10-CM | POA: Diagnosis not present

## 2021-01-05 DIAGNOSIS — I480 Paroxysmal atrial fibrillation: Secondary | ICD-10-CM | POA: Diagnosis not present

## 2021-01-05 DIAGNOSIS — M353 Polymyalgia rheumatica: Secondary | ICD-10-CM | POA: Diagnosis not present

## 2021-01-05 DIAGNOSIS — I739 Peripheral vascular disease, unspecified: Secondary | ICD-10-CM | POA: Diagnosis not present

## 2021-01-05 DIAGNOSIS — I5032 Chronic diastolic (congestive) heart failure: Secondary | ICD-10-CM | POA: Diagnosis not present

## 2021-01-05 DIAGNOSIS — S72002D Fracture of unspecified part of neck of left femur, subsequent encounter for closed fracture with routine healing: Secondary | ICD-10-CM | POA: Diagnosis not present

## 2021-01-05 MED ORDER — TIZANIDINE HCL 2 MG PO CAPS: 2.0000 mg | ORAL_CAPSULE | Freq: Two times a day (BID) | ORAL | 0 refills | Status: DC | PRN

## 2021-01-05 NOTE — Progress Notes (Signed)
Virtual Visit via Video Note  I connected with [name]  on 01/05/21 at  2:00 PM EST by a video enabled telemedicine application and verified that I am speaking with the correct person using two identifiers.  Location patient: home, [place] Location provider:work or home office Persons participating in the virtual visit: patient, provider, aide  I discussed the limitations of evaluation and management by telemedicine and the availability of in person appointments. The patient expressed understanding and agreed to proceed.   HPI:  Acute telemedicine visit for back and side pain: -Onset: 2 days ago -Symptoms include:stapping pain in R upper back and shoulder, wrapping around to the rib cage -can't think of injury or inciting event, but walks with a walker -Denies: fevers, malaise, cough, nasal congestion, NVD, CP, SOB -Has tried:tylenol helps -Pertinent past medical history: Polymyalgia, OA - difuse in the back (sees a rheumatologist and orthopedic specialist), has been in touch with rheumatologist as recently started tapering the prednisone and thought that may have caused this   ROS: See pertinent positives and negatives per HPI.  Past Medical History:  Diagnosis Date  . DVT (deep venous thrombosis) ([place]) 09/2015   RLE  . GERD (gastroesophageal reflux disease)   . Hypertension   . Hypothyroidism   . Melanoma ([place]) 06/16/2017   Facial melanoma, removed by Dr. Harvel Quale  . Peripheral vascular disease ([place])    peripheral neuropathy    Past Surgical History:  Procedure Laterality Date  . ABDOMINAL HYSTERECTOMY    . APPENDECTOMY    . BACK SURGERY    . BREAST SURGERY     left breast biopsy  . CARDIOVERSION N/A 05/09/2020   Procedure: CARDIOVERSION;  Surgeon: Skeet Latch, MD;  Location: [place];  Service: Cardiovascular;  Laterality: N/A;  . COLOSTOMY N/A 01/06/2020   Procedure: Colostomy;  Surgeon: Greer Pickerel, MD;  Location: [place];  Service: General;  Laterality: N/A;  .  EYE SURGERY     cataract  . HARDWARE REMOVAL Left 01/01/2020   Procedure: HARDWARE REMOVAL;  Surgeon: Iran Planas, MD;  Location: [place];  Service: Orthopedics;  Laterality: Left;  . HIP ARTHROPLASTY Left 12/03/2020   Procedure: ARTHROPLASTY BIPOLAR HIP (HEMIARTHROPLASTY);  Surgeon: Renette Butters, MD;  Location: [place];  Service: Orthopedics;  Laterality: Left;  . LAPAROTOMY N/A 01/06/2020   Procedure: Exploratory Laparotomy, Open Sigmoid colectomy;  Surgeon: Greer Pickerel, MD;  Location: [place];  Service: General;  Laterality: N/A;  . LIPOMA EXCISION Left 04/25/2015   Procedure: EXCISION OF LIPOMA LEFT ARM;  Surgeon: Donnie Mesa, MD;  Location: [place];  Service: General;  Laterality: Left;  . LUMBAR LAMINECTOMY/DECOMPRESSION MICRODISCECTOMY  11/20/2011   Procedure: LUMBAR LAMINECTOMY/DECOMPRESSION MICRODISCECTOMY;  Surgeon: Jeneen Rinks P [place];  Location: [facility];  Service: Orthopedics;  Laterality: N/A;  Decompressive Laminectomy L2 to the Sacrum (X-Ray)  . OPEN REDUCTION INTERNAL FIXATION (ORIF) DISTAL RADIAL FRACTURE Left 01/01/2020   Procedure: OPEN REDUCTION INTERNAL FIXATION (ORIF) DISTAL RADIAL FRACTURE;  Surgeon: Iran Planas, MD;  Location: [place];  Service: Orthopedics;  Laterality: Left;  . OTHER SURGICAL HISTORY     left wrist surgery - has plate in left wrist   . OTHER SURGICAL HISTORY     right knee surgery due to torn cartilage   . TONSILLECTOMY    . WRIST FRACTURE SURGERY Left      Current Outpatient Medications:  .  tizanidine (ZANAFLEX) 2 MG capsule, Take 1 capsule (2 mg total) by mouth 2 (two) times daily as  needed for muscle spasms., Disp: 15 capsule, Rfl: 0 .  acetaminophen (TYLENOL) 325 MG tablet, Take 1-2 tablets (325-650 mg total) by mouth every 4 (four) hours as needed for mild pain. (Patient not taking: Reported on 12/03/2020), Disp:  , Rfl:  .  acetaminophen (TYLENOL) 500 MG tablet, Take 500 mg by mouth at bedtime., Disp: , Rfl:  .  alum & mag  hydroxide-simeth (MAALOX/MYLANTA) 200-200-20 MG/5ML suspension, Take 30 mLs by mouth every 6 (six) hours as needed for indigestion or heartburn., Disp: 355 mL, Rfl: 0 .  apixaban (ELIQUIS) 5 MG TABS tablet, Take 1 tablet (5 mg total) by mouth 2 (two) times daily., Disp: 180 tablet, Rfl: 3 .  bisacodyl (DULCOLAX) 10 MG suppository, Place 1 suppository (10 mg total) rectally daily as needed for moderate constipation., Disp: 12 suppository, Rfl: 0 .  diltiazem (DILACOR XR) 120 MG 24 hr capsule, Take 1 capsule (120 mg total) by mouth daily., Disp: , Rfl: 1 .  docusate sodium (COLACE) 100 MG capsule, Take 1 capsule (100 mg total) by mouth 2 (two) times daily., Disp: 60 capsule, Rfl: 0 .  gabapentin (NEURONTIN) 300 MG capsule, Take 300 mg by mouth at bedtime., Disp: , Rfl:  .  levothyroxine (SYNTHROID) 50 MCG tablet, TAKE 1 TABLET BY MOUTH EVERY DAY (Patient taking differently: Take 50 mcg by mouth daily before breakfast.), Disp: 90 tablet, Rfl: 1 .  losartan (COZAAR) 50 MG tablet, Take 1 tablet (50 mg total) by mouth daily., Disp: 30 tablet, Rfl: 3 .  Multiple Vitamins-Minerals (PRESERVISION AREDS 2 PO), Take 1 tablet by mouth daily at 12 noon., Disp: , Rfl:  .  nadolol (CORGARD) 80 MG tablet, TAKE 1 TABLET BY MOUTH EVERY DAY (Patient taking differently: Take 80 mg by mouth in the morning.), Disp: 90 tablet, Rfl: 1 .  pantoprazole (PROTONIX) 40 MG tablet, Take 1 tablet (40 mg total) by mouth daily. (Patient taking differently: Take 40 mg by mouth 2 (two) times daily before a meal.), Disp: 90 tablet, Rfl: 1 .  polyethylene glycol (MIRALAX / GLYCOLAX) 17 g packet, Take 17 g by mouth See admin instructions. Mix 17 grams of powder into 8 ounces of water and drink two times a day and an additional 17 grams once daily as needed for constiptaion, Disp: , Rfl:  .  predniSONE (DELTASONE) 1 MG tablet, Take 4 tablets (4 mg total) by mouth daily with lunch., Disp: , Rfl:  .  senna-docusate (SENOKOT-S) 8.6-50 MG tablet,  Take 1 tablet by mouth at bedtime., Disp: 30 tablet, Rfl: 0 .  senna-docusate (SENOKOT-S) 8.6-50 MG tablet, Take 1 tablet by mouth at bedtime as needed for mild constipation., Disp: , Rfl:  .  sucralfate (CARAFATE) 1 g tablet, Take 1 tablet (1 g total) by mouth with breakfast, with lunch, and with evening meal., Disp: 90 tablet, Rfl: 0 No current facility-administered medications for this visit.  Facility-Administered Medications Ordered in Other Visits:  .  bupivacaine liposome (EXPAREL) 1.3 % injection 266 mg, 20 mL, Infiltration, Once, Swayne, Mary M, RPH  EXAM:  VITALS per patient if applicable:  GENERAL: alert, oriented, appears well and in no acute distress  HEENT: atraumatic, conjunttiva clear, no obvious abnormalities on inspection of external nose and ears  NECK: normal movements of the head and neck  LUNGS: on inspection no signs of respiratory distress, breathing rate appears normal, no obvious gross SOB, gasping or wheezing  CV: no obvious cyanosis  MS: moves all visible extremities without noticeable abnormality -  points to R upper shoulder/traps as area of concern, wrapping around back and under R axillary region  SKIN: denies any skin lesions or changes in this area  PSYCH/NEURO: pleasant and cooperative, no obvious depression or anxiety, speech and thought processing grossly intact  ASSESSMENT AND PLAN:  Discussed the following assessment and plan:  Acute right-sided thoracic back pain  Side pain  -we discussed possible serious and likely etiologies, options for evaluation and workup, limitations of telemedicine visit vs in person visit, treatment, treatment risks and precautions. Pt prefers to treat via telemedicine empirically rather than in person at this moment.  Shingles came to mind when she described the area of pain, but she denies any skin changes and reports has had shingles an this does not feel like that to her. Query musculoskeletal, possibly radicular  symptoms versus other. Discussed lung and other organ pathologies that can cause pain in these areas as well.  Advised an in person exam would be helpful to figure this out, however she prefers to try some treatments over this visit first.  We opted to try Tylenol, as this has helped the last few days, and a low dose muscle relaxer with gentle activity. Discussed tylenol dosing. Advised prompt in person evaluation if worsening, new symptoms arise, or is not improving.  She agrees to schedule follow-up with orthopedic specialist, primary care office or urgent care if needed. Advised to seek prompt in person care if worsening, new symptoms arise, or if is not improving with treatment. Discussed options for inperson care if PCP office not available. Did let this patient know that I only do telemedicine on Tuesdays and Thursdays for [place]. Advised to schedule follow up visit with PCP or [facility] if any further questions or concerns to avoid delays in care.   I discussed the assessment and treatment plan with the patient. The patient was provided an opportunity to ask questions and all were answered. The patient agreed with the plan and demonstrated an understanding of the instructions.     Lucretia Kern, DO

## 2021-01-05 NOTE — Patient Instructions (Signed)
-  I sent the medication(s) we discussed to your pharmacy: Meds ordered this encounter  Medications  . tizanidine (ZANAFLEX) 2 MG capsule    Sig: Take 1 capsule (2 mg total) by mouth 2 (two) times daily as needed for muscle spasms.    Dispense:  15 capsule    Refill:  0   -do not use more than 2 tablets of regular or extra strength tylenol more than 2-3 times daily.  -follow up with your primary care doctor or your orthopedic doctor next week.    I hope you are feeling better soon!  Seek in person care promptly if your symptoms worsen, new concerns arise or you are not improving with treatment.  It was nice to meet you today. I help  out with telemedicine visits on Tuesdays and Thursdays and am available for visits on those days. If you have any concerns or questions following this visit please schedule a follow up visit with your Primary Care doctor or seek care at a local urgent care clinic to avoid delays in care.

## 2021-01-06 ENCOUNTER — Encounter: Payer: Self-pay | Admitting: Family Medicine

## 2021-01-07 ENCOUNTER — Other Ambulatory Visit: Payer: Self-pay | Admitting: Family Medicine

## 2021-01-08 ENCOUNTER — Ambulatory Visit: Payer: Self-pay | Admitting: Surgery

## 2021-01-08 NOTE — H&P (Signed)
[name] Appointment: 08/24/2020 11:00 AM Location: [facility] Patient #: [id number] DOB: 1923-05-23 Widowed / Language: Cleophus Molt / Race: White Female   History of Present Illness [name] MD; 08/24/2020 1:18 PM) The patient is a 87 year old female who presents with diverticulitis. Note for "Diverticulitis": ` ` ` Patient sent for surgical consultation at the request of Dr Redmond Pulling  Chief Complaint: Desire for colostomy takedown. History of emergency Hartmann resection for perforated diverticulitis ` ` The patient is a 87 year old female. She is here with her son and daughter. Another one has healthcare power of attorney but is not here today. She required emergency Hartmann colon resection for colon perforation in February 2020 by my partner, Dr. Greer Pickerel. She had had a arm fracture requiring surgery. Seems like she developed an ileus and diverticulitis. Perforation. Resection with colostomy. She was in the hospital for over a week but eventually stabilized and recovered. She had an open wound to close down. She has a colostomy. She followed up with the initial surgeon and wished to have colostomy takedown. Given her advanced age, patient was referred to me for consideration. Patient did go into atrial fibrillation at the time of her emergency surgery and is on amiodarone. She is anticoagulated on Eliquis. No cardiac events since. She is followed by Dr. Virl Axe who is seen her and feels like she is increased but acceptable risk for surgery. Do not think she's had a colonoscopy a while but she did get a barium enema preoperatively that showed no obvious lesions.  Patient uses a walker for balance issues but claims she can walk 20 minutes easily. She does not smoke. No diabetes. No personal nor family history of GI/colon cancer, inflammatory bowel disease, irritable bowel syndrome, allergy such as Celiac Sprue, dietary/dairy problems, colitis,  ulcers nor gastritis. No recent sick contacts/gastroenteritis. No travel outside the country. No changes in diet. No dysphagia to solids or liquids. No significant heartburn or reflux. No melena, hematemesis, coffee ground emesis. No evidence of prior gastric/peptic ulceration.  (Review of systems as stated in this history (HPI) or in the review of systems. Otherwise all other 12 point ROS are negative) ` ` ###########################################`  This patient encounter took 55 minutes today to perform the following: obtain history, perform exam, review outside records, interpret tests & imaging, counsel the patient on their diagnosis; and, document this encounter, including findings & plan in the electronic health record (EHR).   Allergies Darden Palmer, [place]; 08/24/2020 11:09 AM) Codeine Phosphate *ANALGESICS - OPIOID*  Keflex *CEPHALOSPORINS*  Allergies Reconciled   Medication History Darden Palmer, RMA; 08/24/2020 11:10 AM) Cefdinir (300MG  Capsule, Oral) Active. dilTIAZem HCl ER Coated Beads (120MG  Capsule ER 24HR, Oral) Active. Eliquis (5MG  Tablet, Oral) Active. Gabapentin (300MG  Capsule, Oral) Active. Levothyroxine Sodium (50MCG Tablet, Oral) Active. Metoprolol Tartrate (25MG  Tablet, Oral) Active. Nadolol (80MG  Tablet, Oral) Active. Omeprazole (20MG  Capsule DR, Oral) Active. Ondansetron HCl (4MG  Tablet, Oral) Active. predniSONE (5MG  Tablet, Oral) Active. Medications Reconciled  Vitals Lattie Haw Caldwell RMA; 08/24/2020 11:11 AM) 08/24/2020 11:11 AM Weight: 152.38 lb Height: 66in Body Surface Area: 1.78 m Body Mass Index: 24.59 kg/m  Temp.: 97.93F  Pulse: 61 (Regular)  P.OX: 96% (Room air) BP: 120/76(Sitting, Left Arm, Standard)       Physical Exam [name] MD; 08/24/2020 12:53 PM) General Mental Status-Alert. General Appearance-Not in acute distress, Not Sickly. Orientation-Oriented X3. Hydration-Well  hydrated. Voice-Normal. Note: Bright and alert. Moves up to bed easily without any assistance.  Well-developed. Well-nourished. Uses walker for balance   Integumentary Global Assessment Upon inspection and palpation of skin surfaces of the - Axillae: non-tender, no inflammation or ulceration, no drainage. and Distribution of scalp and body hair is normal. General Characteristics Temperature - normal warmth is noted.  Head and Neck Head-normocephalic, atraumatic with no lesions or palpable masses. Face Global Assessment - atraumatic, no absence of expression. Neck Global Assessment - no abnormal movements, no bruit auscultated on the right, no bruit auscultated on the left, no decreased range of motion, non-tender. Trachea-midline. Thyroid Gland Characteristics - non-tender.  Eye Eyeball - Left-Extraocular movements intact, No Nystagmus - Left. Eyeball - Right-Extraocular movements intact, No Nystagmus - Right. Cornea - Left-No Hazy - Left. Cornea - Right-No Hazy - Right. Sclera/Conjunctiva - Left-No scleral icterus, No Discharge - Left. Sclera/Conjunctiva - Right-No scleral icterus, No Discharge - Right. Pupil - Left-Direct reaction to light normal. Pupil - Right-Direct reaction to light normal.  ENMT Ears Pinna - Left - no drainage observed, no generalized tenderness observed. Pinna - Right - no drainage observed, no generalized tenderness observed. Nose and Sinuses External Inspection of the Nose - no destructive lesion observed. Inspection of the nares - Left - quiet respiration. Inspection of the nares - Right - quiet respiration. Mouth and Throat Lips - Upper Lip - no fissures observed, no pallor noted. Lower Lip - no fissures observed, no pallor noted. Nasopharynx - no discharge present. Oral Cavity/Oropharynx - Tongue - no dryness observed. Oral Mucosa - no cyanosis observed. Hypopharynx - no evidence of airway distress observed.  Chest and Lung  Exam Inspection Movements - Normal and Symmetrical. Accessory muscles - No use of accessory muscles in breathing. Palpation Palpation of the chest reveals - Non-tender. Auscultation Breath sounds - Normal and Clear.  Cardiovascular Auscultation Rhythm - Regular. Murmurs & Other Heart Sounds - Auscultation of the heart reveals - No Murmurs and No Systolic Clicks.  Abdomen Inspection Inspection of the abdomen reveals - No Visible peristalsis and No Abnormal pulsations. Umbilicus - No Bleeding, No Urine drainage. Palpation/Percussion Palpation and Percussion of the abdomen reveal - Soft, Non Tender, No Rebound tenderness, No Rigidity (guarding) and No Cutaneous hyperesthesia. Note: Abdomen soft. Not severely distended. Colostomy in place without any parastomal hernia. Midline incision closed with no hernia. No diastasis recti. No umbilical or other anterior abdominal wall hernias   Female Genitourinary Sexual Maturity Tanner 5 - Adult hair pattern. Note: No vaginal bleeding nor discharge   Rectal Note: Perianal skin clear with minimal anal tags. Normal sphincter tone. No fissure or fistula. Tolerates digital exam. Grade 2 internal hemorrhoids. No obvious retained wax or stool. No masses felt to 6 cm circumferentially.   Peripheral Vascular Upper Extremity Inspection - Left - No Cyanotic nailbeds - Left, Not Ischemic. Inspection - Right - No Cyanotic nailbeds - Right, Not Ischemic.  Neurologic Neurologic evaluation reveals -normal attention span and ability to concentrate, able to name objects and repeat phrases. Appropriate fund of knowledge , normal sensation and normal coordination. Mental Status Affect - not angry, not paranoid. Cranial Nerves-Normal Bilaterally. Gait-Normal.  Neuropsychiatric Mental status exam performed with findings of-able to articulate well with normal speech/language, rate, volume and coherence, thought content normal with ability to  perform basic computations and apply abstract reasoning and no evidence of hallucinations, delusions, obsessions or homicidal/suicidal ideation.  Musculoskeletal Global Assessment Spine, Ribs and Pelvis - no instability, subluxation or laxity. Right Upper Extremity - no instability, subluxation or laxity.  Lymphatic Head & Neck  General Head & Neck Lymphatics: Bilateral - Description - No Localized lymphadenopathy. Axillary  General Axillary Region: Bilateral - Description - No Localized lymphadenopathy. Femoral & Inguinal  Generalized Femoral & Inguinal Lymphatics: Left - Description - No Localized lymphadenopathy. Right - Description - No Localized lymphadenopathy.    Assessment & Plan [name] MD; 08/24/2020 1:29 PM) STATUS POST COLOSTOMY (Z93.3) HISTORY OF DIVERTICULITIS OF COLON (Z87.19) Impression: History of perforated diverticulitis requiring emergency Hartmann resection. Despite her very advanced age, she is recovered rather well and actually is pretty functional with a good quality of life. She wishes to consider colostomy takedown. There are operative risks are increased given her advanced age, she got through emergency surgery earlier this year rather well. She is followed by cardiology and is felt to be a reasonable risk. A long discussion the patient and her children noting that risks of morbidity mortality or increased a 87 year old. However she has good exercise tolerance is otherwise well with a decent quality of life. She wishes to be aggressive and get her colostomy taken down. Her children seemed to wished honor her request.  Once her steroids are weaned down to 5 mg, can proceed with colostomy takedown. Patient anticipates being down to that dose in late December. Most likely that means will do in early January.  Would hold off on colonoscopy since her barium enema was underwhelming. We'll do rigid proctoscopy at the start of the case to rule out any endoluminal  rectal masses although none was obvious distally on digital exam nor anything huge on CAT scan. Current Plans Pt Education - [facility] Diverticular Disease (AT) PREOP COLON - ENCOUNTER FOR PREOPERATIVE EXAMINATION FOR GENERAL SURGICAL PROCEDURE (Z01.818) Current Plans You are being scheduled for surgery- Our schedulers will call you.  Plan to consider robotic-assisted colostomy takedown once you have weaned on her prednisone down to 5 mg per Dr. Trudie Reed. Tentative plan in [place] should hear from our office's scheduling department within 5 working days about the location, date, and time of surgery. We try to make accommodations for patient's preferences in scheduling surgery, but sometimes the OR schedule or the surgeon's schedule prevents [place] from making those accommodations.  If you have not heard from our office [phone]) in 5 working days, call the office and ask for your surgeon's nurse.  If you have other questions about your diagnosis, plan, or surgery, call the office and ask for your surgeon's nurse.  Written instructions provided The anatomy & physiology of the digestive tract was discussed. The pathophysiology of the colon was discussed. Natural history risks without surgery was discussed. I feel the risks of no intervention will lead to serious problems that outweigh the operative risks; therefore, I recommended a partial colectomy to remove the pathology. Minimally invasive (Robotic/Laparoscopic) & open techniques were discussed.  Risks such as bleeding, infection, abscess, leak, reoperation, possible ostomy, hernia, heart attack, death, and other risks were discussed. I noted a good likelihood this will help address the problem. Goals of post-operative recovery were discussed as well. Need for adequate nutrition, daily bowel regimen and healthy physical activity, to optimize recovery was noted as well. We will work to minimize complications. Educational materials  were available as well. Questions were answered. The patient expresses understanding & wishes to proceed with surgery.  Pt Education - [facility] Colon Bowel Prep 2018 ERAS/Miralax/Antibiotics Started Neomycin Sulfate 500 MG Oral Tablet, 2 (two) Tablet SEE NOTE, #6, 08/24/2020, No Refill. Local Order: Pharmacist Notes: TAKE TWO TABLETS AT 2  PM, 3 PM, AND 10 PM THE DAY PRIOR TO SURGERY Started Flagyl 500 MG Oral Tablet, 2 (two) Tablet SEE NOTE, #6, 08/24/2020, No Refill. Local Order: Pharmacist Notes: Take at 2pm, 3pm, and 10pm the day prior to your colon operation Pt Education - Pamphlet Given - Laparoscopic Colorectal Surgery: discussed with patient and provided information. Pt Education - [facility] Colectomy post-op instructions: discussed with patient and provided information. POLYMYALGIA RHEUMATICA (M35.3) Impression: Polymyalgia controlled with immunosuppression. Her rheumatologist try to wean her steroids. STEROID DEPENDENCE (F19.20) Impression: Surgery dependent rheumatoid issues. Pulmonology.  I agree with her rheumatologist that her operative risk will be minimized her prednisone can come down to 5 mg. They're doing a very slow taper for this. Once she is down to 5 mg, consider colostomy takedown. Hopefully more likely to be successful with decreasing anastomotic leak. Patient believes she'll be down to 5 mg in late December. HISTORY OF ATRIAL FIBRILLATION (Z86.79) ANTICOAGULATED (Z79.01) Impression: Anticoagulated on Eliquis for atrial fibrillation. Rate controlled. Should be able to hold Eliquis 2 days preop. Already cleared by cardiology. Actually has normal ejection fraction based on echocardiogram in July Current Plans I recommended obtaining preoperative cardiac clearance. I am concerned about the health of the patient and the ability to tolerate the operation. Therefore, we will request clearance by cardiology to better assess operative risk & see if a reevaluation, further workup, etc is  needed. Also recommendations on how medications such as for anticoagulation and blood pressure should be managed/held/restarted after surgery. Pt Education - [facility] Hold anticoagulation preoperatively  [name], MD, FACS, MASCRS  Gastrointestinal and Minimally Invasive Surgery  [facility] [address], [place], [place] [address] [phone] Fax [phone] Main/Paging  CONTACT INFORMATION: Weekday (9AM-5PM) concerns: Call [facility] main office at [phone] Weeknight (5PM-9AM) or Weekend/Holiday concerns: Check [email or web address] for General Surgery [facility] coverage (Please, do not use SecureChat as it is not reliable communication to operating surgeons for immediate patient care)       Cleared by cardiology none.  With preop. Rheumatology follow-up for polymyalgia input - reasonable to taper prednisone down. Initial ostomy takedown the January given COVID-19 Covid pandemic. She developed hip fracture requiring surgery 12/03/2020 by Fredonia Highland - recovering She is ready for surgery  [name], MD, FACS, MASCRS  Gastrointestinal and Minimally Invasive Surgery  [facility] [address] [place], [place] [address] [phone] Fax [phone] Main/Paging  CONTACT INFORMATION: Weekday (9AM-5PM) concerns: Call [facility] main office at [phone] Weeknight (5PM-9AM) or Weekend/Holiday concerns: Check [email or web address] for General Surgery [facility] coverage (Please, do not use SecureChat as it is not reliable communication to operating surgeons for immediate patient care)

## 2021-01-08 NOTE — Progress Notes (Signed)
Pt. Needs orders for upcomming surgery. PAT and labs on 01/09/21.Thanks.

## 2021-01-08 NOTE — H&P (View-Only) (Signed)
[name] Appointment: 08/24/2020 11:00 AM Location: [facility] Patient #: [id number] DOB: [birth year] Widowed / Language: Cleophus Molt / Race: White Female   History of Present Illness [name] MD; 08/24/2020 1:18 PM) The patient is a 87 year old female who presents with diverticulitis. Note for "Diverticulitis": ` ` ` Patient sent for surgical consultation at the request of Dr Redmond Pulling  Chief Complaint: Desire for colostomy takedown. History of emergency Hartmann resection for perforated diverticulitis ` ` The patient is a 87 year old female. She is here with her son and daughter. Another one has healthcare power of attorney but is not here today. She required emergency Hartmann colon resection for colon perforation in February 2020 by my partner, Dr. Greer Pickerel. She had had a arm fracture requiring surgery. Seems like she developed an ileus and diverticulitis. Perforation. Resection with colostomy. She was in the hospital for over a week but eventually stabilized and recovered. She had an open wound to close down. She has a colostomy. She followed up with the initial surgeon and wished to have colostomy takedown. Given her advanced age, patient was referred to me for consideration. Patient did go into atrial fibrillation at the time of her emergency surgery and is on amiodarone. She is anticoagulated on Eliquis. No cardiac events since. She is followed by Dr. Virl Axe who is seen her and feels like she is increased but acceptable risk for surgery. Do not think she's had a colonoscopy a while but she did get a barium enema preoperatively that showed no obvious lesions.  Patient uses a walker for balance issues but claims she can walk 20 minutes easily. She does not smoke. No diabetes. No personal nor family history of GI/colon cancer, inflammatory bowel disease, irritable bowel syndrome, allergy such as Celiac Sprue, dietary/dairy problems, colitis,  ulcers nor gastritis. No recent sick contacts/gastroenteritis. No travel outside the country. No changes in diet. No dysphagia to solids or liquids. No significant heartburn or reflux. No melena, hematemesis, coffee ground emesis. No evidence of prior gastric/peptic ulceration.  (Review of systems as stated in this history (HPI) or in the review of systems. Otherwise all other 12 point ROS are negative) ` ` ###########################################`  This patient encounter took 55 minutes today to perform the following: obtain history, perform exam, review outside records, interpret tests & imaging, counsel the patient on their diagnosis; and, document this encounter, including findings & plan in the electronic health record (EHR).   Allergies Darden Palmer, [place]; 08/24/2020 11:09 AM) Codeine Phosphate *ANALGESICS - OPIOID*  Keflex *CEPHALOSPORINS*  Allergies Reconciled   Medication History Darden Palmer, RMA; 08/24/2020 11:10 AM) Cefdinir (300MG  Capsule, Oral) Active. dilTIAZem HCl ER Coated Beads (120MG  Capsule ER 24HR, Oral) Active. Eliquis (5MG  Tablet, Oral) Active. Gabapentin (300MG  Capsule, Oral) Active. Levothyroxine Sodium (50MCG Tablet, Oral) Active. Metoprolol Tartrate (25MG  Tablet, Oral) Active. Nadolol (80MG  Tablet, Oral) Active. Omeprazole (20MG  Capsule DR, Oral) Active. Ondansetron HCl (4MG  Tablet, Oral) Active. predniSONE (5MG  Tablet, Oral) Active. Medications Reconciled  Vitals Lattie Haw Caldwell RMA; 08/24/2020 11:11 AM) 08/24/2020 11:11 AM Weight: 152.38 lb Height: 66in Body Surface Area: 1.78 m Body Mass Index: 24.59 kg/m  Temp.: 97.77F  Pulse: 61 (Regular)  P.OX: 96% (Room air) BP: 120/76(Sitting, Left Arm, Standard)       Physical Exam [name] MD; 08/24/2020 12:53 PM) General Mental Status-Alert. General Appearance-Not in acute distress, Not Sickly. Orientation-Oriented X3. Hydration-Well  hydrated. Voice-Normal. Note: Bright and alert. Moves up to bed easily without any assistance.  Well-developed. Well-nourished. Uses walker for balance   Integumentary Global Assessment Upon inspection and palpation of skin surfaces of the - Axillae: non-tender, no inflammation or ulceration, no drainage. and Distribution of scalp and body hair is normal. General Characteristics Temperature - normal warmth is noted.  Head and Neck Head-normocephalic, atraumatic with no lesions or palpable masses. Face Global Assessment - atraumatic, no absence of expression. Neck Global Assessment - no abnormal movements, no bruit auscultated on the right, no bruit auscultated on the left, no decreased range of motion, non-tender. Trachea-midline. Thyroid Gland Characteristics - non-tender.  Eye Eyeball - Left-Extraocular movements intact, No Nystagmus - Left. Eyeball - Right-Extraocular movements intact, No Nystagmus - Right. Cornea - Left-No Hazy - Left. Cornea - Right-No Hazy - Right. Sclera/Conjunctiva - Left-No scleral icterus, No Discharge - Left. Sclera/Conjunctiva - Right-No scleral icterus, No Discharge - Right. Pupil - Left-Direct reaction to light normal. Pupil - Right-Direct reaction to light normal.  ENMT Ears Pinna - Left - no drainage observed, no generalized tenderness observed. Pinna - Right - no drainage observed, no generalized tenderness observed. Nose and Sinuses External Inspection of the Nose - no destructive lesion observed. Inspection of the nares - Left - quiet respiration. Inspection of the nares - Right - quiet respiration. Mouth and Throat Lips - Upper Lip - no fissures observed, no pallor noted. Lower Lip - no fissures observed, no pallor noted. Nasopharynx - no discharge present. Oral Cavity/Oropharynx - Tongue - no dryness observed. Oral Mucosa - no cyanosis observed. Hypopharynx - no evidence of airway distress observed.  Chest and Lung  Exam Inspection Movements - Normal and Symmetrical. Accessory muscles - No use of accessory muscles in breathing. Palpation Palpation of the chest reveals - Non-tender. Auscultation Breath sounds - Normal and Clear.  Cardiovascular Auscultation Rhythm - Regular. Murmurs & Other Heart Sounds - Auscultation of the heart reveals - No Murmurs and No Systolic Clicks.  Abdomen Inspection Inspection of the abdomen reveals - No Visible peristalsis and No Abnormal pulsations. Umbilicus - No Bleeding, No Urine drainage. Palpation/Percussion Palpation and Percussion of the abdomen reveal - Soft, Non Tender, No Rebound tenderness, No Rigidity (guarding) and No Cutaneous hyperesthesia. Note: Abdomen soft. Not severely distended. Colostomy in place without any parastomal hernia. Midline incision closed with no hernia. No diastasis recti. No umbilical or other anterior abdominal wall hernias   Female Genitourinary Sexual Maturity Tanner 5 - Adult hair pattern. Note: No vaginal bleeding nor discharge   Rectal Note: Perianal skin clear with minimal anal tags. Normal sphincter tone. No fissure or fistula. Tolerates digital exam. Grade 2 internal hemorrhoids. No obvious retained wax or stool. No masses felt to 6 cm circumferentially.   Peripheral Vascular Upper Extremity Inspection - Left - No Cyanotic nailbeds - Left, Not Ischemic. Inspection - Right - No Cyanotic nailbeds - Right, Not Ischemic.  Neurologic Neurologic evaluation reveals -normal attention span and ability to concentrate, able to name objects and repeat phrases. Appropriate fund of knowledge , normal sensation and normal coordination. Mental Status Affect - not angry, not paranoid. Cranial Nerves-Normal Bilaterally. Gait-Normal.  Neuropsychiatric Mental status exam performed with findings of-able to articulate well with normal speech/language, rate, volume and coherence, thought content normal with ability to  perform basic computations and apply abstract reasoning and no evidence of hallucinations, delusions, obsessions or homicidal/suicidal ideation.  Musculoskeletal Global Assessment Spine, Ribs and Pelvis - no instability, subluxation or laxity. Right Upper Extremity - no instability, subluxation or laxity.  Lymphatic Head & Neck  General Head & Neck Lymphatics: Bilateral - Description - No Localized lymphadenopathy. Axillary  General Axillary Region: Bilateral - Description - No Localized lymphadenopathy. Femoral & Inguinal  Generalized Femoral & Inguinal Lymphatics: Left - Description - No Localized lymphadenopathy. Right - Description - No Localized lymphadenopathy.    Assessment & Plan [name] MD; 08/24/2020 1:29 PM) STATUS POST COLOSTOMY (Z93.3) HISTORY OF DIVERTICULITIS OF COLON (Z87.19) Impression: History of perforated diverticulitis requiring emergency Hartmann resection. Despite her very advanced age, she is recovered rather well and actually is pretty functional with a good quality of life. She wishes to consider colostomy takedown. There are operative risks are increased given her advanced age, she got through emergency surgery earlier this year rather well. She is followed by cardiology and is felt to be a reasonable risk. A long discussion the patient and her children noting that risks of morbidity mortality or increased a 87 year old. However she has good exercise tolerance is otherwise well with a decent quality of life. She wishes to be aggressive and get her colostomy taken down. Her children seemed to wished honor her request.  Once her steroids are weaned down to 5 mg, can proceed with colostomy takedown. Patient anticipates being down to that dose in late December. Most likely that means will do in early January.  Would hold off on colonoscopy since her barium enema was underwhelming. We'll do rigid proctoscopy at the start of the case to rule out any endoluminal  rectal masses although none was obvious distally on digital exam nor anything huge on CAT scan. Current Plans Pt Education - [facility] Diverticular Disease (AT) PREOP COLON - ENCOUNTER FOR PREOPERATIVE EXAMINATION FOR GENERAL SURGICAL PROCEDURE (Z01.818) Current Plans You are being scheduled for surgery- Our schedulers will call you.  Plan to consider robotic-assisted colostomy takedown once you have weaned on her prednisone down to 5 mg per Dr. Trudie Reed. Tentative plan in [place] should hear from our office's scheduling department within 5 working days about the location, date, and time of surgery. We try to make accommodations for patient's preferences in scheduling surgery, but sometimes the OR schedule or the surgeon's schedule prevents [place] from making those accommodations.  If you have not heard from our office [phone]) in 5 working days, call the office and ask for your surgeon's nurse.  If you have other questions about your diagnosis, plan, or surgery, call the office and ask for your surgeon's nurse.  Written instructions provided The anatomy & physiology of the digestive tract was discussed. The pathophysiology of the colon was discussed. Natural history risks without surgery was discussed. I feel the risks of no intervention will lead to serious problems that outweigh the operative risks; therefore, I recommended a partial colectomy to remove the pathology. Minimally invasive (Robotic/Laparoscopic) & open techniques were discussed.  Risks such as bleeding, infection, abscess, leak, reoperation, possible ostomy, hernia, heart attack, death, and other risks were discussed. I noted a good likelihood this will help address the problem. Goals of post-operative recovery were discussed as well. Need for adequate nutrition, daily bowel regimen and healthy physical activity, to optimize recovery was noted as well. We will work to minimize complications. Educational materials  were available as well. Questions were answered. The patient expresses understanding & wishes to proceed with surgery.  Pt Education - [facility] Colon Bowel Prep 2018 ERAS/Miralax/Antibiotics Started Neomycin Sulfate 500 MG Oral Tablet, 2 (two) Tablet SEE NOTE, #6, 08/24/2020, No Refill. Local Order: Pharmacist Notes: TAKE TWO TABLETS AT 2  PM, 3 PM, AND 10 PM THE DAY PRIOR TO SURGERY Started Flagyl 500 MG Oral Tablet, 2 (two) Tablet SEE NOTE, #6, 08/24/2020, No Refill. Local Order: Pharmacist Notes: Take at 2pm, 3pm, and 10pm the day prior to your colon operation Pt Education - Pamphlet Given - Laparoscopic Colorectal Surgery: discussed with patient and provided information. Pt Education - [facility] Colectomy post-op instructions: discussed with patient and provided information. POLYMYALGIA RHEUMATICA (M35.3) Impression: Polymyalgia controlled with immunosuppression. Her rheumatologist try to wean her steroids. STEROID DEPENDENCE (F19.20) Impression: Surgery dependent rheumatoid issues. Pulmonology.  I agree with her rheumatologist that her operative risk will be minimized her prednisone can come down to 5 mg. They're doing a very slow taper for this. Once she is down to 5 mg, consider colostomy takedown. Hopefully more likely to be successful with decreasing anastomotic leak. Patient believes she'll be down to 5 mg in late December. HISTORY OF ATRIAL FIBRILLATION (Z86.79) ANTICOAGULATED (Z79.01) Impression: Anticoagulated on Eliquis for atrial fibrillation. Rate controlled. Should be able to hold Eliquis 2 days preop. Already cleared by cardiology. Actually has normal ejection fraction based on echocardiogram in July Current Plans I recommended obtaining preoperative cardiac clearance. I am concerned about the health of the patient and the ability to tolerate the operation. Therefore, we will request clearance by cardiology to better assess operative risk & see if a reevaluation, further workup, etc is  needed. Also recommendations on how medications such as for anticoagulation and blood pressure should be managed/held/restarted after surgery. Pt Education - [facility] Hold anticoagulation preoperatively  [name], MD, FACS, MASCRS  Gastrointestinal and Minimally Invasive Surgery  [facility] [address] [place], [place] [address] [phone] Fax [phone] Main/Paging  CONTACT INFORMATION: Weekday (9AM-5PM) concerns: Call [facility] main office at [phone] Weeknight (5PM-9AM) or Weekend/Holiday concerns: Check [email or web address] for General Surgery [facility] coverage (Please, do not use SecureChat as it is not reliable communication to operating surgeons for immediate patient care)       Cleared by cardiology none.  With preop. Rheumatology follow-up for polymyalgia input - reasonable to taper prednisone down. Initial ostomy takedown the January given COVID-19 Covid pandemic. She developed hip fracture requiring surgery 12/03/2020 by Fredonia Highland - recovering She is ready for surgery  [name], MD, FACS, MASCRS  Gastrointestinal and Minimally Invasive Surgery  [facility] [address], [place], [place] [address] [phone] Fax [phone] Main/Paging  CONTACT INFORMATION: Weekday (9AM-5PM) concerns: Call [facility] main office at [phone] Weeknight (5PM-9AM) or Weekend/Holiday concerns: Check [email or web address] for General Surgery [facility] coverage (Please, do not use SecureChat as it is not reliable communication to operating surgeons for immediate patient care)

## 2021-01-08 NOTE — Patient Instructions (Addendum)
DUE TO COVID-19 ONLY ONE VISITOR IS ALLOWED TO COME WITH YOU AND STAY IN THE WAITING ROOM ONLY DURING PRE OP AND PROCEDURE DAY OF SURGERY. THE 1 VISITOR  MAY VISIT WITH YOU AFTER SURGERY IN YOUR PRIVATE ROOM DURING VISITING HOURS ONLY!  YOU NEED TO HAVE A COVID 19 TEST ON: 01/15/21 @ 1:15 PM , THIS TEST MUST BE DONE BEFORE SURGERY,  COVID TESTING SITE [place]  [id number], IT IS ON THE RIGHT GOING OUT [address] APPROXIMATELY  2 MINUTES PAST [employer] ON THE RIGHT. ONCE YOUR COVID TEST IS COMPLETED,  PLEASE BEGIN THE QUARANTINE INSTRUCTIONS AS OUTLINED IN YOUR HANDOUT.                GRETTELL RANSDELL   Your procedure is scheduled on: 01/17/21   Report to [facility] Main  Entrance   Report to admitting at: 6:30 AM     Call this number if you have problems the morning of surgery [phone]    Remember: DRINK 2 [place] AT  1000 PM AND 1 PRESURGERY DRINK THE DAY OF THE PROCEDURE 3 HOURS PRIOR TO SCHEDULED SURGERY. NO SOLIDS AFTER MIDNIGHT THE DAY PRIOR TO THE SURGERY. NOTHING BY MOUTH EXCEPT CLEAR LIQUIDS UNTIL THREE HOURS PRIOR TO SCHEDULED SURGERY. PLEASE FINISH PRESURGERY ENSURE DRINK PER SURGEON ORDER 3 HOURS PRIOR TO SCHEDULED SURGERY TIME WHICH NEEDS TO BE COMPLETED AT: 5:30 AM.  CLEAR LIQUID DIET  Foods Allowed                                                                     Foods Excluded  Coffee and tea, regular and decaf                             liquids that you cannot  Plain Jell-O any favor except red or purple                                           see through such as: Fruit ices (not with fruit pulp)                                     milk, soups, orange juice  Iced Popsicles                                    All solid food Carbonated beverages, regular and diet                                    Cranberry, grape and apple juices Sports drinks like Gatorade Lightly seasoned clear  broth or consume(fat free) Sugar, honey syrup  Sample Menu Breakfast  Lunch                                     Supper Cranberry juice                    Beef broth                            Chicken broth Jell-O                                     Grape juice                           Apple juice Coffee or tea                        Jell-O                                      Popsicle                                                Coffee or tea                        Coffee or tea  _____________________________________________________________________  MAKE SURE TO DRINK PLENTY CLEAR FLUIDS THE DAY OF THE PREP.  BRUSH YOUR TEETH MORNING OF SURGERY AND RINSE YOUR MOUTH OUT, NO CHEWING GUM CANDY OR MINTS.    Take these medicines the morning of surgery with A SIP OF WATER: Diltiazem,levothyroxine,pantoprazole,nadolol.                               You may not have any metal on your body including hair pins and              piercings  Do not wear jewelry, make-up, lotions, powders or perfumes, deodorant             Do not wear nail polish on your fingernails.  Do not shave  48 hours prior to surgery.    Do not bring valuables to the hospital. [place].  Contacts, dentures or bridgework may not be worn into surgery.  Leave suitcase in the car. After surgery it may be brought to your room.     Patients discharged the day of surgery will not be allowed to drive home. IF YOU ARE HAVING SURGERY AND GOING HOME THE SAME DAY, YOU MUST HAVE AN ADULT TO DRIVE YOU HOME AND BE WITH YOU FOR 24 HOURS. YOU MAY GO HOME BY TAXI OR UBER OR ORTHERWISE, BUT AN ADULT MUST ACCOMPANY YOU HOME AND STAY WITH YOU FOR 24 HOURS.  Name and phone number of your driver:  Special Instructions: N/A              Please read over the following  fact sheets you were given: _____________________________________________________________________          [facility] - Preparing for Surgery Before surgery, you can play an important role.  Because skin is not sterile, your skin needs to be as free of germs as possible.  You can reduce the number of germs on your skin by washing with CHG (chlorahexidine gluconate) soap before surgery.  CHG is an antiseptic cleaner which kills germs and bonds with the skin to continue killing germs even after washing. Please DO NOT use if you have an allergy to CHG or antibacterial soaps.  If your skin becomes reddened/irritated stop using the CHG and inform your nurse when you arrive at Short Stay. Do not shave (including legs and underarms) for at least 48 hours prior to the first CHG shower.  You may shave your face/neck. Please follow these instructions carefully:  1.  Shower with CHG Soap the night before surgery and the  morning of Surgery.  2.  If you choose to wash your hair, wash your hair first as usual with your  normal  shampoo.  3.  After you shampoo, rinse your hair and body thoroughly to remove the  shampoo.                           4.  Use CHG as you would any other liquid soap.  You can apply chg directly  to the skin and wash                       Gently with a scrungie or clean washcloth.  5.  Apply the CHG Soap to your body ONLY FROM THE NECK DOWN.   Do not use on face/ open                           Wound or open sores. Avoid contact with eyes, ears mouth and genitals (private parts).                       Wash face,  Genitals (private parts) with your normal soap.             6.  Wash thoroughly, paying special attention to the area where your surgery  will be performed.  7.  Thoroughly rinse your body with warm water from the neck down.  8.  DO NOT shower/wash with your normal soap after using and rinsing off  the CHG Soap.                9.  Pat yourself dry with a clean towel.            10.  Wear clean pajamas.            11.  Place clean sheets on your bed the night of your first shower  and do not  sleep with pets. Day of Surgery : Do not apply any lotions/deodorants the morning of surgery.  Please wear clean clothes to the hospital/surgery center.  FAILURE TO FOLLOW THESE INSTRUCTIONS MAY RESULT IN THE CANCELLATION OF YOUR SURGERY PATIENT SIGNATURE_________________________________  NURSE SIGNATURE__________________________________  ________________________________________________________________________   [name]  An incentive spirometer is a tool that can help keep your lungs clear and active. This tool measures how well you are filling your lungs with each breath. Taking long deep breaths may help reverse or decrease the  chance of developing breathing (pulmonary) problems (especially infection) following:  A long period of time when you are unable to move or be active. BEFORE THE PROCEDURE   If the spirometer includes an indicator to show your best effort, your nurse or respiratory therapist will set it to a desired goal.  If possible, sit up straight or lean slightly forward. Try not to slouch.  Hold the incentive spirometer in an upright position. INSTRUCTIONS FOR USE  1. Sit on the edge of your bed if possible, or sit up as far as you can in bed or on a chair. 2. Hold the incentive spirometer in an upright position. 3. Breathe out normally. 4. Place the mouthpiece in your mouth and seal your lips tightly around it. 5. Breathe in slowly and as deeply as possible, raising the piston or the ball toward the top of the column. 6. Hold your breath for 3-5 seconds or for as long as possible. Allow the piston or ball to fall to the bottom of the column. 7. Remove the mouthpiece from your mouth and breathe out normally. 8. Rest for a few seconds and repeat Steps 1 through 7 at least 10 times every 1-2 hours when you are awake. Take your time and take a few normal breaths between deep breaths. 9. The spirometer may include an indicator to show your best  effort. Use the indicator as a goal to work toward during each repetition. 10. After each set of 10 deep breaths, practice coughing to be sure your lungs are clear. If you have an incision (the cut made at the time of surgery), support your incision when coughing by placing a pillow or rolled up towels firmly against it. Once you are able to get out of bed, walk around indoors and cough well. You may stop using the incentive spirometer when instructed by your caregiver.  RISKS AND COMPLICATIONS  Take your time so you do not get dizzy or light-headed.  If you are in pain, you may need to take or ask for pain medication before doing incentive spirometry. It is harder to take a deep breath if you are having pain. AFTER USE  Rest and breathe slowly and easily.  It can be helpful to keep track of a log of your progress. Your caregiver can provide you with a simple table to help with this. If you are using the spirometer at home, follow these instructions: [place] IF:   You are having difficultly using the spirometer.  You have trouble using the spirometer as often as instructed.  Your pain medication is not giving enough relief while using the spirometer.  You develop fever of 100.5 F (38.1 C) or higher. SEEK IMMEDIATE MEDICAL CARE IF:   You cough up bloody sputum that had not been present before.  You develop fever of 102 F (38.9 C) or greater.  You develop worsening pain at or near the incision site. MAKE SURE YOU:   Understand these instructions.  Will watch your condition.  Will get help right away if you are not doing well or get worse. Document Released: 03/10/2007 Document Revised: 01/20/2012 Document Reviewed: 05/11/2007 [facility] Patient Information 2014 [place], [place].   ________________________________________________________________________

## 2021-01-09 ENCOUNTER — Encounter (HOSPITAL_COMMUNITY): Payer: Self-pay

## 2021-01-09 ENCOUNTER — Other Ambulatory Visit: Payer: Self-pay

## 2021-01-09 ENCOUNTER — Encounter (HOSPITAL_COMMUNITY)
Admission: RE | Admit: 2021-01-09 | Discharge: 2021-01-09 | Disposition: A | Discharge status: Home / Self Care | Payer: Medicare PPO | Source: Ambulatory Visit | Attending: Surgery | Admitting: Surgery

## 2021-01-09 DIAGNOSIS — I110 Hypertensive heart disease with heart failure: Secondary | ICD-10-CM | POA: Diagnosis not present

## 2021-01-09 DIAGNOSIS — K2100 Gastro-esophageal reflux disease with esophagitis, without bleeding: Secondary | ICD-10-CM | POA: Diagnosis not present

## 2021-01-09 DIAGNOSIS — I739 Peripheral vascular disease, unspecified: Secondary | ICD-10-CM | POA: Diagnosis not present

## 2021-01-09 DIAGNOSIS — I480 Paroxysmal atrial fibrillation: Secondary | ICD-10-CM | POA: Diagnosis not present

## 2021-01-09 DIAGNOSIS — Z01812 Encounter for preprocedural laboratory examination: Secondary | ICD-10-CM | POA: Insufficient documentation

## 2021-01-09 DIAGNOSIS — S72002D Fracture of unspecified part of neck of left femur, subsequent encounter for closed fracture with routine healing: Secondary | ICD-10-CM | POA: Diagnosis not present

## 2021-01-09 DIAGNOSIS — M353 Polymyalgia rheumatica: Secondary | ICD-10-CM | POA: Diagnosis not present

## 2021-01-09 DIAGNOSIS — I951 Orthostatic hypotension: Secondary | ICD-10-CM | POA: Diagnosis not present

## 2021-01-09 DIAGNOSIS — I5032 Chronic diastolic (congestive) heart failure: Secondary | ICD-10-CM | POA: Diagnosis not present

## 2021-01-09 DIAGNOSIS — G629 Polyneuropathy, unspecified: Secondary | ICD-10-CM | POA: Diagnosis not present

## 2021-01-09 HISTORY — DX: Cardiac arrhythmia, unspecified: I49.9

## 2021-01-09 LAB — BASIC METABOLIC PANEL
Anion gap: 7 (ref 5–15)
BUN: 13 mg/dL (ref 8–23)
CO2: 28 mmol/L (ref 22–32)
Calcium: 9.2 mg/dL (ref 8.9–10.3)
Chloride: 107 mmol/L (ref 98–111)
Creatinine, Ser: 0.75 mg/dL (ref 0.44–1.00)
GFR, Estimated: >60 mL/min (ref >60)
Glucose, Bld: 106 mg/dL — ABNORMAL HIGH (ref 70–99)
Potassium: 4.2 mmol/L (ref 3.5–5.1)
Sodium: 142 mmol/L (ref 135–145)

## 2021-01-09 LAB — CBC
HCT: 36.2 % (ref 36.0–46.0)
Hemoglobin: 11.3 g/dL — ABNORMAL LOW (ref 12.0–15.0)
MCH: 29.7 pg (ref 26.0–34.0)
MCHC: 31.2 g/dL (ref 30.0–36.0)
MCV: 95 fL (ref 80.0–100.0)
Platelets: 355 10*3/uL (ref 150–400)
RBC: 3.81 MIL/uL — ABNORMAL LOW (ref 3.87–5.11)
RDW: 13.8 % (ref 11.5–15.5)
WBC: 12.5 10*3/uL — ABNORMAL HIGH (ref 4.0–10.5)
nRBC: 0 % (ref 0.0–0.2)

## 2021-01-09 LAB — HEMOGLOBIN A1C
Hgb A1c MFr Bld: 5.9 % — ABNORMAL HIGH (ref 4.8–5.6)
Mean Plasma Glucose: 122.63 mg/dL

## 2021-01-09 NOTE — Progress Notes (Signed)
COVID Vaccine Completed: Yes Date COVID Vaccine completed:08/28/20 COVID vaccine manufacturer: [place]    PCP - Dr. Annye Asa Cardiologist - Dr. Virl Axe. Clearance: 10/31/20. EPIC  Chest x-ray - 11/23/20 EKG - 12/02/20 Stress Test -  ECHO - 05/12/20 Cardiac Cath -  Pacemaker/ICD device last checked:  Sleep Study -  CPAP -   Fasting Blood Sugar -  Checks Blood Sugar _____ times a day  Blood Thinner Instructions: Eliquis will be hold 5 days before surgery as per Dr. Olin Pia instructions. Aspirin Instructions: Last Dose:  Anesthesia review: Hx: DVT,HTN,PVD,Afib.  Patient denies shortness of breath, fever, cough and chest pain at PAT appointment   Patient verbalized understanding of instructions that were given to them at the PAT appointment. Patient was also instructed that they will need to review over the PAT instructions again at home before surgery.

## 2021-01-10 ENCOUNTER — Encounter: Payer: Self-pay | Admitting: Family Medicine

## 2021-01-10 ENCOUNTER — Ambulatory Visit: Payer: Medicare PPO | Admitting: Family Medicine

## 2021-01-10 DIAGNOSIS — M353 Polymyalgia rheumatica: Secondary | ICD-10-CM

## 2021-01-10 NOTE — Patient Instructions (Signed)
Follow up as needed or as scheduled CONTINUE the 4mg  Prednisone daily ICE as needed Tylenol for breakthrough pain Call with any questions or concerns Stay Safe!  Stay Healthy! GOOD LUCK WITH SURGERY!!!

## 2021-01-10 NOTE — Progress Notes (Signed)
   Subjective:    Patient ID: [name], female    DOB: Jun 09, 1923, 87 y.o.   MRN: [id number]  HPI R sided pain- woke 1 week ago and her R 'shoulder and down my arm were just killing killing me'.  Sxs then radiated to neck.  Pain then 'settled in my hand'.  Pain started 2 days after decreasing the Prednisone to 3mg  daily.  She increased back to the 4mg  daily.  Pt states that when pain was present it was ok if she held perfectly still but any movement caused shooting pain.  Pain resolved on Sunday- after increasing her prednisone back to 4mg  x2 days   Review of Systems For ROS see HPI   This visit occurred during the SARS-CoV-2 public health emergency.  Safety protocols were in place, including screening questions prior to the visit, additional usage of staff PPE, and extensive cleaning of exam room while observing appropriate contact time as indicated for disinfecting solutions.       Objective:   Physical Exam Vitals reviewed.  Constitutional:      General: She is not in acute distress.    Appearance: Normal appearance. She is not ill-appearing.  HENT:     Head: Normocephalic and atraumatic.  Eyes:     Extraocular Movements: Extraocular movements intact.     Conjunctiva/sclera: Conjunctivae normal.     Pupils: Pupils are equal, round, and reactive to light.  Cardiovascular:     Pulses: Normal pulses.  Musculoskeletal:        General: No swelling or signs of injury.     Cervical back: Normal range of motion and neck supple. No rigidity or tenderness.     Comments: Good ROM of R shoulder, full ROM of R elbow.  Full ROM of R fingers/hand but some discomfort  Skin:    General: Skin is warm.  Neurological:     General: No focal deficit present.     Mental Status: She is alert and oriented to person, place, and time.  Psychiatric:        Mood and Affect: Mood normal.        Behavior: Behavior normal.        Thought Content: Thought content normal.           Assessment &  Plan:

## 2021-01-11 DIAGNOSIS — M353 Polymyalgia rheumatica: Secondary | ICD-10-CM | POA: Insufficient documentation

## 2021-01-11 NOTE — Assessment & Plan Note (Signed)
Deteriorated.  Pt has been following w/ Dr Trudie Reed.  They have been attempting to taper/wean off her daily Prednisone.  She was doing well until she dropped to 3mg  daily and then she developed severe neck, R shoulder, and R arm pain.  This thankfully resolved 2 days after increasing her Prednisone back to 4mg  daily.  Discussed that she should remain at 4mg  daily as this seems to be her threshold.  Surgeon is ok proceeding w/ colostomy reversal at this level.  No med changes at this time.  Will continue to follow along.

## 2021-01-15 ENCOUNTER — Other Ambulatory Visit (HOSPITAL_COMMUNITY)
Admission: RE | Admit: 2021-01-15 | Discharge: 2021-01-15 | Disposition: A | Discharge status: Home / Self Care | Payer: Medicare PPO | Source: Ambulatory Visit | Attending: Surgery | Admitting: Surgery

## 2021-01-15 DIAGNOSIS — I4819 Other persistent atrial fibrillation: Secondary | ICD-10-CM | POA: Diagnosis not present

## 2021-01-15 DIAGNOSIS — Z7901 Long term (current) use of anticoagulants: Secondary | ICD-10-CM | POA: Diagnosis not present

## 2021-01-15 DIAGNOSIS — I482 Chronic atrial fibrillation, unspecified: Secondary | ICD-10-CM | POA: Diagnosis present

## 2021-01-15 DIAGNOSIS — Z01812 Encounter for preprocedural laboratory examination: Secondary | ICD-10-CM | POA: Insufficient documentation

## 2021-01-15 DIAGNOSIS — S7292XA Unspecified fracture of left femur, initial encounter for closed fracture: Secondary | ICD-10-CM | POA: Diagnosis not present

## 2021-01-15 DIAGNOSIS — R768 Other specified abnormal immunological findings in serum: Secondary | ICD-10-CM | POA: Diagnosis present

## 2021-01-15 DIAGNOSIS — Z86718 Personal history of other venous thrombosis and embolism: Secondary | ICD-10-CM | POA: Diagnosis not present

## 2021-01-15 DIAGNOSIS — Z96642 Presence of left artificial hip joint: Secondary | ICD-10-CM | POA: Diagnosis present

## 2021-01-15 DIAGNOSIS — Z433 Encounter for attention to colostomy: Secondary | ICD-10-CM | POA: Diagnosis not present

## 2021-01-15 DIAGNOSIS — K432 Incisional hernia without obstruction or gangrene: Secondary | ICD-10-CM | POA: Diagnosis present

## 2021-01-15 DIAGNOSIS — K5730 Diverticulosis of large intestine without perforation or abscess without bleeding: Secondary | ICD-10-CM | POA: Diagnosis not present

## 2021-01-15 DIAGNOSIS — Z9049 Acquired absence of other specified parts of digestive tract: Secondary | ICD-10-CM | POA: Diagnosis not present

## 2021-01-15 DIAGNOSIS — K660 Peritoneal adhesions (postprocedural) (postinfection): Secondary | ICD-10-CM | POA: Diagnosis present

## 2021-01-15 DIAGNOSIS — M6281 Muscle weakness (generalized): Secondary | ICD-10-CM | POA: Diagnosis not present

## 2021-01-15 DIAGNOSIS — R2689 Other abnormalities of gait and mobility: Secondary | ICD-10-CM | POA: Diagnosis not present

## 2021-01-15 DIAGNOSIS — Z20822 Contact with and (suspected) exposure to covid-19: Secondary | ICD-10-CM | POA: Insufficient documentation

## 2021-01-15 DIAGNOSIS — I110 Hypertensive heart disease with heart failure: Secondary | ICD-10-CM | POA: Diagnosis not present

## 2021-01-15 DIAGNOSIS — M353 Polymyalgia rheumatica: Secondary | ICD-10-CM | POA: Diagnosis present

## 2021-01-15 DIAGNOSIS — I5030 Unspecified diastolic (congestive) heart failure: Secondary | ICD-10-CM | POA: Diagnosis not present

## 2021-01-15 DIAGNOSIS — S7292XS Unspecified fracture of left femur, sequela: Secondary | ICD-10-CM | POA: Diagnosis not present

## 2021-01-15 DIAGNOSIS — Z7952 Long term (current) use of systemic steroids: Secondary | ICD-10-CM | POA: Diagnosis not present

## 2021-01-15 DIAGNOSIS — Z79890 Hormone replacement therapy (postmenopausal): Secondary | ICD-10-CM | POA: Diagnosis not present

## 2021-01-15 DIAGNOSIS — Z803 Family history of malignant neoplasm of breast: Secondary | ICD-10-CM | POA: Diagnosis not present

## 2021-01-15 DIAGNOSIS — I739 Peripheral vascular disease, unspecified: Secondary | ICD-10-CM | POA: Diagnosis present

## 2021-01-15 DIAGNOSIS — N3941 Urge incontinence: Secondary | ICD-10-CM | POA: Diagnosis present

## 2021-01-15 DIAGNOSIS — I10 Essential (primary) hypertension: Secondary | ICD-10-CM | POA: Diagnosis present

## 2021-01-15 DIAGNOSIS — E039 Hypothyroidism, unspecified: Secondary | ICD-10-CM | POA: Diagnosis present

## 2021-01-15 DIAGNOSIS — Z9181 History of falling: Secondary | ICD-10-CM | POA: Diagnosis not present

## 2021-01-15 DIAGNOSIS — K435 Parastomal hernia without obstruction or  gangrene: Secondary | ICD-10-CM | POA: Diagnosis present

## 2021-01-15 DIAGNOSIS — Z825 Family history of asthma and other chronic lower respiratory diseases: Secondary | ICD-10-CM | POA: Diagnosis not present

## 2021-01-15 DIAGNOSIS — Z85820 Personal history of malignant melanoma of skin: Secondary | ICD-10-CM | POA: Diagnosis not present

## 2021-01-15 LAB — SARS CORONAVIRUS 2 (TAT 6-24 HRS): SARS Coronavirus 2: NEGATIVE

## 2021-01-16 MED ORDER — BUPIVACAINE LIPOSOME 1.3 % IJ SUSP
20.0000 mL | Freq: Once | INTRAMUSCULAR | Status: DC
  Filled 2021-01-16: qty 20

## 2021-01-17 ENCOUNTER — Other Ambulatory Visit (HOSPITAL_COMMUNITY): Payer: Self-pay | Admitting: Surgery

## 2021-01-17 ENCOUNTER — Inpatient Hospital Stay (HOSPITAL_COMMUNITY): Payer: Medicare PPO | Admitting: Anesthesiology

## 2021-01-17 ENCOUNTER — Encounter (HOSPITAL_COMMUNITY)
Admission: RE | Disposition: A | Discharge status: Home / Self Care | Payer: Self-pay | Source: Home / Self Care | Attending: Surgery

## 2021-01-17 ENCOUNTER — Other Ambulatory Visit: Payer: Self-pay

## 2021-01-17 ENCOUNTER — Encounter (HOSPITAL_COMMUNITY): Payer: Self-pay | Admitting: Surgery

## 2021-01-17 ENCOUNTER — Inpatient Hospital Stay (HOSPITAL_COMMUNITY): Payer: Medicare PPO | Admitting: Physician Assistant

## 2021-01-17 ENCOUNTER — Inpatient Hospital Stay (HOSPITAL_COMMUNITY)
Admission: RE | Admit: 2021-01-17 | Discharge: 2021-01-21 | DRG: 330 | Disposition: A | Discharge status: Home Health | Payer: Medicare PPO | Attending: Surgery | Admitting: Surgery

## 2021-01-17 DIAGNOSIS — N3941 Urge incontinence: Secondary | ICD-10-CM | POA: Diagnosis present

## 2021-01-17 DIAGNOSIS — Z96642 Presence of left artificial hip joint: Secondary | ICD-10-CM | POA: Diagnosis present

## 2021-01-17 DIAGNOSIS — Z7901 Long term (current) use of anticoagulants: Secondary | ICD-10-CM

## 2021-01-17 DIAGNOSIS — Z20822 Contact with and (suspected) exposure to covid-19: Secondary | ICD-10-CM | POA: Diagnosis present

## 2021-01-17 DIAGNOSIS — Z825 Family history of asthma and other chronic lower respiratory diseases: Secondary | ICD-10-CM

## 2021-01-17 DIAGNOSIS — K432 Incisional hernia without obstruction or gangrene: Secondary | ICD-10-CM | POA: Diagnosis present

## 2021-01-17 DIAGNOSIS — I482 Chronic atrial fibrillation, unspecified: Secondary | ICD-10-CM | POA: Diagnosis present

## 2021-01-17 DIAGNOSIS — Z86718 Personal history of other venous thrombosis and embolism: Secondary | ICD-10-CM | POA: Diagnosis not present

## 2021-01-17 DIAGNOSIS — K435 Parastomal hernia without obstruction or  gangrene: Secondary | ICD-10-CM | POA: Diagnosis present

## 2021-01-17 DIAGNOSIS — Z79890 Hormone replacement therapy (postmenopausal): Secondary | ICD-10-CM | POA: Diagnosis not present

## 2021-01-17 DIAGNOSIS — Z7952 Long term (current) use of systemic steroids: Secondary | ICD-10-CM

## 2021-01-17 DIAGNOSIS — M353 Polymyalgia rheumatica: Secondary | ICD-10-CM | POA: Diagnosis present

## 2021-01-17 DIAGNOSIS — K5720 Diverticulitis of large intestine with perforation and abscess without bleeding: Secondary | ICD-10-CM

## 2021-01-17 DIAGNOSIS — I10 Essential (primary) hypertension: Secondary | ICD-10-CM | POA: Diagnosis present

## 2021-01-17 DIAGNOSIS — Z9049 Acquired absence of other specified parts of digestive tract: Secondary | ICD-10-CM

## 2021-01-17 DIAGNOSIS — Z433 Encounter for attention to colostomy: Secondary | ICD-10-CM | POA: Diagnosis not present

## 2021-01-17 DIAGNOSIS — K660 Peritoneal adhesions (postprocedural) (postinfection): Secondary | ICD-10-CM | POA: Diagnosis present

## 2021-01-17 DIAGNOSIS — Z85820 Personal history of malignant melanoma of skin: Secondary | ICD-10-CM

## 2021-01-17 DIAGNOSIS — R768 Other specified abnormal immunological findings in serum: Secondary | ICD-10-CM | POA: Diagnosis present

## 2021-01-17 DIAGNOSIS — E039 Hypothyroidism, unspecified: Secondary | ICD-10-CM | POA: Diagnosis present

## 2021-01-17 DIAGNOSIS — Z803 Family history of malignant neoplasm of breast: Secondary | ICD-10-CM | POA: Diagnosis not present

## 2021-01-17 DIAGNOSIS — I739 Peripheral vascular disease, unspecified: Secondary | ICD-10-CM | POA: Diagnosis present

## 2021-01-17 HISTORY — PX: LYSIS OF ADHESION: SHX5961

## 2021-01-17 HISTORY — PX: XI ROBOTIC ASSISTED COLOSTOMY TAKEDOWN: SHX6828

## 2021-01-17 LAB — TYPE AND SCREEN
ABO/RH(D): O POS
Antibody Screen: NEGATIVE

## 2021-01-17 SURGERY — CLOSURE, COLOSTOMY, ROBOT-ASSISTED
Anesthesia: General | Site: Abdomen

## 2021-01-17 MED ORDER — EPHEDRINE 5 MG/ML INJ
INTRAVENOUS | Status: AC
  Filled 2021-01-17: qty 10

## 2021-01-17 MED ORDER — TRAMADOL HCL 50 MG PO TABS
50.0000 mg | ORAL_TABLET | Freq: Four times a day (QID) | ORAL | Status: DC | PRN
Start: 2021-01-17 — End: 2021-01-21
  Administered 2021-01-17 – 2021-01-19 (×3): 50 mg via ORAL
  Filled 2021-01-17 (×3): qty 1

## 2021-01-17 MED ORDER — SIMETHICONE 80 MG PO CHEW: 40.0000 mg | CHEWABLE_TABLET | Freq: Four times a day (QID) | ORAL | Status: DC | PRN

## 2021-01-17 MED ORDER — CHLORHEXIDINE GLUCONATE 0.12 % MT SOLN
15.0000 mL | Freq: Once | OROMUCOSAL | Status: AC
  Administered 2021-01-17: 15 mL via OROMUCOSAL

## 2021-01-17 MED ORDER — FENTANYL CITRATE (PF) 250 MCG/5ML IJ SOLN
INTRAMUSCULAR | Status: DC | PRN
  Administered 2021-01-17 (×5): 50 ug via INTRAVENOUS

## 2021-01-17 MED ORDER — SODIUM CHLORIDE 0.9 % IV SOLN: Freq: Three times a day (TID) | INTRAVENOUS | Status: AC | PRN

## 2021-01-17 MED ORDER — SODIUM CHLORIDE 0.9 % IV SOLN
2.0000 g | Freq: Two times a day (BID) | INTRAVENOUS | Status: AC
  Administered 2021-01-17: 21:00:00 2 g via INTRAVENOUS
  Filled 2021-01-17: qty 2

## 2021-01-17 MED ORDER — DIPHENHYDRAMINE HCL 12.5 MG/5ML PO ELIX
12.5000 mg | ORAL_SOLUTION | Freq: Four times a day (QID) | ORAL | Status: DC | PRN
Start: 2021-01-17 — End: 2021-01-21

## 2021-01-17 MED ORDER — GABAPENTIN 300 MG PO CAPS
300.0000 mg | ORAL_CAPSULE | Freq: Every day | ORAL | Status: DC
  Administered 2021-01-17 – 2021-01-20 (×4): 300 mg via ORAL
  Filled 2021-01-17 (×4): qty 1

## 2021-01-17 MED ORDER — BUPIVACAINE LIPOSOME 1.3 % IJ SUSP
INTRAMUSCULAR | Status: DC | PRN
  Administered 2021-01-17: 20 mL

## 2021-01-17 MED ORDER — MAGIC MOUTHWASH
15.0000 mL | Freq: Four times a day (QID) | ORAL | Status: DC | PRN
  Filled 2021-01-17: qty 15

## 2021-01-17 MED ORDER — FENTANYL CITRATE (PF) 100 MCG/2ML IJ SOLN
INTRAMUSCULAR | Status: AC
  Filled 2021-01-17: qty 2

## 2021-01-17 MED ORDER — ENALAPRILAT 1.25 MG/ML IV SOLN
0.6250 mg | Freq: Four times a day (QID) | INTRAVENOUS | Status: DC | PRN
  Filled 2021-01-17: qty 1

## 2021-01-17 MED ORDER — PROCHLORPERAZINE EDISYLATE 10 MG/2ML IJ SOLN: 5.0000 mg | Freq: Four times a day (QID) | INTRAMUSCULAR | Status: DC | PRN

## 2021-01-17 MED ORDER — KETAMINE HCL 10 MG/ML IJ SOLN
INTRAMUSCULAR | Status: DC | PRN
  Administered 2021-01-17: 30 mg via INTRAVENOUS

## 2021-01-17 MED ORDER — ACETAMINOPHEN 500 MG PO TABS
1000.0000 mg | ORAL_TABLET | ORAL | Status: AC
  Administered 2021-01-17: 1000 mg via ORAL
  Filled 2021-01-17: qty 2

## 2021-01-17 MED ORDER — ENOXAPARIN SODIUM 40 MG/0.4ML ~~LOC~~ SOLN
40.0000 mg | Freq: Once | SUBCUTANEOUS | Status: AC
  Administered 2021-01-17: 40 mg via SUBCUTANEOUS
  Filled 2021-01-17: qty 0.4

## 2021-01-17 MED ORDER — NADOLOL 20 MG PO TABS
40.0000 mg | ORAL_TABLET | Freq: Every morning | ORAL | Status: DC
  Administered 2021-01-18 – 2021-01-21 (×4): 40 mg via ORAL
  Filled 2021-01-17 (×2): qty 2
  Filled 2021-01-17: qty 1
  Filled 2021-01-17 (×3): qty 2

## 2021-01-17 MED ORDER — ONDANSETRON HCL 4 MG/2ML IJ SOLN
INTRAMUSCULAR | Status: DC | PRN
  Administered 2021-01-17: 4 mg via INTRAVENOUS

## 2021-01-17 MED ORDER — SODIUM CHLORIDE 0.9 % IV SOLN
INTRAVENOUS | Status: DC
  Filled 2021-01-17: qty 6

## 2021-01-17 MED ORDER — LACTATED RINGERS IR SOLN
Status: DC | PRN
  Administered 2021-01-17: 1000 mL

## 2021-01-17 MED ORDER — PANTOPRAZOLE SODIUM 40 MG PO TBEC
40.0000 mg | DELAYED_RELEASE_TABLET | Freq: Two times a day (BID) | ORAL | Status: DC
  Administered 2021-01-17 – 2021-01-21 (×8): 40 mg via ORAL
  Filled 2021-01-17 (×8): qty 1

## 2021-01-17 MED ORDER — DEXAMETHASONE SODIUM PHOSPHATE 10 MG/ML IJ SOLN
INTRAMUSCULAR | Status: DC | PRN
  Administered 2021-01-17: 5 mg via INTRAVENOUS

## 2021-01-17 MED ORDER — ALVIMOPAN 12 MG PO CAPS: 12.0000 mg | ORAL_CAPSULE | Freq: Two times a day (BID) | ORAL | Status: DC

## 2021-01-17 MED ORDER — LIDOCAINE 2% (20 MG/ML) 5 ML SYRINGE
INTRAMUSCULAR | Status: AC
  Filled 2021-01-17: qty 10

## 2021-01-17 MED ORDER — CHLORHEXIDINE GLUCONATE CLOTH 2 % EX PADS: 6.0000 | MEDICATED_PAD | Freq: Once | CUTANEOUS | Status: DC

## 2021-01-17 MED ORDER — LACTATED RINGERS IV SOLN: INTRAVENOUS | Status: AC

## 2021-01-17 MED ORDER — ALUM & MAG HYDROXIDE-SIMETH 200-200-20 MG/5ML PO SUSP: 30.0000 mL | Freq: Four times a day (QID) | ORAL | Status: DC | PRN

## 2021-01-17 MED ORDER — POLYETHYLENE GLYCOL 3350 17 G PO PACK
17.0000 g | PACK | Freq: Two times a day (BID) | ORAL | Status: DC
  Administered 2021-01-17 – 2021-01-18 (×2): 17 g via ORAL
  Filled 2021-01-17 (×2): qty 1

## 2021-01-17 MED ORDER — ENSURE SURGERY PO LIQD
237.0000 mL | Freq: Two times a day (BID) | ORAL | Status: DC
  Administered 2021-01-19: 11:00:00 237 mL via ORAL

## 2021-01-17 MED ORDER — LIDOCAINE 20MG/ML (2%) 15 ML SYRINGE OPTIME
INTRAMUSCULAR | Status: DC | PRN
  Administered 2021-01-17: 1 mg/kg/h via INTRAVENOUS

## 2021-01-17 MED ORDER — SUGAMMADEX SODIUM 200 MG/2ML IV SOLN
INTRAVENOUS | Status: DC | PRN
  Administered 2021-01-17: 300 mg via INTRAVENOUS

## 2021-01-17 MED ORDER — ENSURE PRE-SURGERY PO LIQD
592.0000 mL | Freq: Once | ORAL | Status: DC
  Filled 2021-01-17: qty 592

## 2021-01-17 MED ORDER — LIP MEDEX EX OINT
1.0000 "application " | TOPICAL_OINTMENT | Freq: Two times a day (BID) | CUTANEOUS | Status: DC
  Administered 2021-01-17 – 2021-01-20 (×7): 1 via TOPICAL
  Filled 2021-01-17 (×2): qty 7

## 2021-01-17 MED ORDER — SUCCINYLCHOLINE CHLORIDE 200 MG/10ML IV SOSY
PREFILLED_SYRINGE | INTRAVENOUS | Status: AC
  Filled 2021-01-17: qty 10

## 2021-01-17 MED ORDER — PHENYLEPHRINE HCL (PRESSORS) 10 MG/ML IV SOLN
INTRAVENOUS | Status: AC
  Filled 2021-01-17: qty 1

## 2021-01-17 MED ORDER — METOPROLOL TARTRATE 5 MG/5ML IV SOLN
5.0000 mg | Freq: Four times a day (QID) | INTRAVENOUS | Status: AC | PRN
Start: 2021-01-17 — End: 2021-01-21
  Administered 2021-01-19 – 2021-01-21 (×4): 5 mg via INTRAVENOUS
  Filled 2021-01-17 (×7): qty 5

## 2021-01-17 MED ORDER — TRAMADOL HCL 50 MG PO TABS: 50.0000 mg | ORAL_TABLET | Freq: Four times a day (QID) | ORAL | 0 refills | Status: DC | PRN

## 2021-01-17 MED ORDER — MELATONIN 3 MG PO TABS
3.0000 mg | ORAL_TABLET | Freq: Every evening | ORAL | Status: DC | PRN
  Administered 2021-01-18: 21:00:00 3 mg via ORAL
  Filled 2021-01-17: qty 1

## 2021-01-17 MED ORDER — ONDANSETRON HCL 4 MG/2ML IJ SOLN
INTRAMUSCULAR | Status: AC
  Filled 2021-01-17: qty 2

## 2021-01-17 MED ORDER — ALVIMOPAN 12 MG PO CAPS
12.0000 mg | ORAL_CAPSULE | ORAL | Status: AC
  Administered 2021-01-17: 12 mg via ORAL
  Filled 2021-01-17: qty 1

## 2021-01-17 MED ORDER — PREDNISONE 1 MG PO TABS
4.0000 mg | ORAL_TABLET | Freq: Every day | ORAL | Status: DC
  Administered 2021-01-18 – 2021-01-21 (×4): 4 mg via ORAL
  Filled 2021-01-17 (×4): qty 4

## 2021-01-17 MED ORDER — BISACODYL 5 MG PO TBEC: 20.0000 mg | DELAYED_RELEASE_TABLET | Freq: Once | ORAL | Status: DC

## 2021-01-17 MED ORDER — ACETAMINOPHEN 500 MG PO TABS
1000.0000 mg | ORAL_TABLET | Freq: Four times a day (QID) | ORAL | Status: DC
  Administered 2021-01-17 – 2021-01-21 (×13): 1000 mg via ORAL
  Filled 2021-01-17 (×13): qty 2

## 2021-01-17 MED ORDER — PROPOFOL 10 MG/ML IV BOLUS
INTRAVENOUS | Status: AC
  Filled 2021-01-17: qty 20

## 2021-01-17 MED ORDER — BUPIVACAINE-EPINEPHRINE (PF) 0.25% -1:200000 IJ SOLN
INTRAMUSCULAR | Status: DC | PRN
  Administered 2021-01-17: 60 mL

## 2021-01-17 MED ORDER — KETAMINE HCL 10 MG/ML IJ SOLN
INTRAMUSCULAR | Status: AC
  Filled 2021-01-17: qty 1

## 2021-01-17 MED ORDER — ROCURONIUM BROMIDE 10 MG/ML (PF) SYRINGE
PREFILLED_SYRINGE | INTRAVENOUS | Status: AC
  Filled 2021-01-17: qty 10

## 2021-01-17 MED ORDER — PHENYLEPHRINE HCL-NACL 10-0.9 MG/250ML-% IV SOLN
INTRAVENOUS | Status: DC | PRN
  Administered 2021-01-17: 50 ug/min via INTRAVENOUS

## 2021-01-17 MED ORDER — METRONIDAZOLE 500 MG PO TABS: 1000.0000 mg | ORAL_TABLET | ORAL | Status: DC

## 2021-01-17 MED ORDER — ENOXAPARIN SODIUM 40 MG/0.4ML ~~LOC~~ SOLN
40.0000 mg | SUBCUTANEOUS | Status: AC
  Administered 2021-01-18 – 2021-01-19 (×2): 40 mg via SUBCUTANEOUS
  Filled 2021-01-17 (×2): qty 0.4

## 2021-01-17 MED ORDER — EPHEDRINE SULFATE-NACL 50-0.9 MG/10ML-% IV SOSY
PREFILLED_SYRINGE | INTRAVENOUS | Status: DC | PRN
  Administered 2021-01-17 (×2): 5 mg via INTRAVENOUS

## 2021-01-17 MED ORDER — ONDANSETRON HCL 4 MG/2ML IJ SOLN: 4.0000 mg | Freq: Four times a day (QID) | INTRAMUSCULAR | Status: DC | PRN

## 2021-01-17 MED ORDER — 0.9 % SODIUM CHLORIDE (POUR BTL) OPTIME
TOPICAL | Status: DC | PRN
  Administered 2021-01-17: 2000 mL

## 2021-01-17 MED ORDER — HYDROMORPHONE HCL 1 MG/ML IJ SOLN
0.5000 mg | INTRAMUSCULAR | Status: DC | PRN
Start: 2021-01-17 — End: 2021-01-21

## 2021-01-17 MED ORDER — DEXAMETHASONE SODIUM PHOSPHATE 10 MG/ML IJ SOLN
INTRAMUSCULAR | Status: AC
  Filled 2021-01-17: qty 1

## 2021-01-17 MED ORDER — NEOMYCIN SULFATE 500 MG PO TABS: 1000.0000 mg | ORAL_TABLET | ORAL | Status: DC

## 2021-01-17 MED ORDER — GABAPENTIN 300 MG PO CAPS
300.0000 mg | ORAL_CAPSULE | ORAL | Status: AC
  Administered 2021-01-17: 300 mg via ORAL
  Filled 2021-01-17: qty 1

## 2021-01-17 MED ORDER — ENSURE PRE-SURGERY PO LIQD
296.0000 mL | Freq: Once | ORAL | Status: DC
  Filled 2021-01-17: qty 296

## 2021-01-17 MED ORDER — PHENYLEPHRINE 40 MCG/ML (10ML) SYRINGE FOR IV PUSH (FOR BLOOD PRESSURE SUPPORT)
PREFILLED_SYRINGE | INTRAVENOUS | Status: AC
  Filled 2021-01-17: qty 10

## 2021-01-17 MED ORDER — LACTATED RINGERS IV SOLN: INTRAVENOUS | Status: DC

## 2021-01-17 MED ORDER — ORAL CARE MOUTH RINSE: 15.0000 mL | Freq: Once | OROMUCOSAL | Status: AC

## 2021-01-17 MED ORDER — POLYETHYLENE GLYCOL 3350 17 GM/SCOOP PO POWD: 1.0000 | Freq: Once | ORAL | Status: DC

## 2021-01-17 MED ORDER — PROPOFOL 10 MG/ML IV BOLUS
INTRAVENOUS | Status: DC | PRN
  Administered 2021-01-17: 100 mg via INTRAVENOUS

## 2021-01-17 MED ORDER — ROCURONIUM BROMIDE 10 MG/ML (PF) SYRINGE
PREFILLED_SYRINGE | INTRAVENOUS | Status: DC | PRN
  Administered 2021-01-17: 10 mg via INTRAVENOUS
  Administered 2021-01-17: 20 mg via INTRAVENOUS
  Administered 2021-01-17: 60 mg via INTRAVENOUS
  Administered 2021-01-17: 10 mg via INTRAVENOUS
  Administered 2021-01-17: 20 mg via INTRAVENOUS

## 2021-01-17 MED ORDER — TIZANIDINE HCL 2 MG PO TABS
2.0000 mg | ORAL_TABLET | Freq: Two times a day (BID) | ORAL | Status: DC | PRN
Start: 2021-01-17 — End: 2021-01-19
  Filled 2021-01-17: qty 1

## 2021-01-17 MED ORDER — LEVOTHYROXINE SODIUM 50 MCG PO TABS
50.0000 ug | ORAL_TABLET | Freq: Every day | ORAL | Status: DC
  Administered 2021-01-18 – 2021-01-21 (×4): 50 ug via ORAL
  Filled 2021-01-17 (×4): qty 1

## 2021-01-17 MED ORDER — ONDANSETRON HCL 4 MG PO TABS: 4.0000 mg | ORAL_TABLET | Freq: Four times a day (QID) | ORAL | Status: DC | PRN

## 2021-01-17 MED ORDER — LIDOCAINE 2% (20 MG/ML) 5 ML SYRINGE
INTRAMUSCULAR | Status: DC | PRN
  Administered 2021-01-17: 40 mg via INTRAVENOUS

## 2021-01-17 MED ORDER — DIPHENHYDRAMINE HCL 50 MG/ML IJ SOLN: 12.5000 mg | Freq: Four times a day (QID) | INTRAMUSCULAR | Status: DC | PRN

## 2021-01-17 MED ORDER — LIDOCAINE 2% (20 MG/ML) 5 ML SYRINGE
INTRAMUSCULAR | Status: AC
  Filled 2021-01-17: qty 5

## 2021-01-17 MED ORDER — FENTANYL CITRATE (PF) 100 MCG/2ML IJ SOLN: 25.0000 ug | INTRAMUSCULAR | Status: DC | PRN

## 2021-01-17 MED ORDER — SUCRALFATE 1 G PO TABS
1.0000 g | ORAL_TABLET | Freq: Three times a day (TID) | ORAL | Status: DC
  Administered 2021-01-17 – 2021-01-20 (×10): 1 g via ORAL
  Filled 2021-01-17 (×6): qty 1

## 2021-01-17 MED ORDER — SODIUM CHLORIDE 0.9 % IV SOLN
2.0000 g | INTRAVENOUS | Status: AC
  Administered 2021-01-17: 2 g via INTRAVENOUS
  Filled 2021-01-17: qty 2

## 2021-01-17 MED ORDER — PROCHLORPERAZINE MALEATE 10 MG PO TABS
10.0000 mg | ORAL_TABLET | Freq: Four times a day (QID) | ORAL | Status: DC | PRN
  Filled 2021-01-17: qty 1

## 2021-01-17 MED ORDER — ALBUMIN HUMAN 5 % IV SOLN: INTRAVENOUS | Status: DC | PRN

## 2021-01-17 MED ORDER — BUPIVACAINE-EPINEPHRINE (PF) 0.25% -1:200000 IJ SOLN
INTRAMUSCULAR | Status: AC
  Filled 2021-01-17: qty 60

## 2021-01-17 MED FILL — traMADol HCL 50 MG TABS: 50 | 4 days supply | Qty: 20 | Fill #0

## 2021-01-17 SURGICAL SUPPLY — 118 items
APL PRP STRL LF DISP 70% ISPRP (MISCELLANEOUS)
APPLIER CLIP 5 13 M/L LIGAMAX5 (MISCELLANEOUS)
APPLIER CLIP ROT 10 11.4 M/L (STAPLE)
APR CLP MED LRG 11.4X10 (STAPLE)
APR CLP MED LRG 5 ANG JAW (MISCELLANEOUS)
BLADE EXTENDED COATED 6.5IN (ELECTRODE) IMPLANT
CANNULA REDUC XI 12-8 STAPL (CANNULA)
CANNULA REDUCER 12-8 DVNC XI (CANNULA) IMPLANT
CELLS DAT CNTRL 66122 CELL SVR (MISCELLANEOUS) IMPLANT
CHLORAPREP W/TINT 26 (MISCELLANEOUS) IMPLANT
CLIP APPLIE 5 13 M/L LIGAMAX5 (MISCELLANEOUS) IMPLANT
CLIP APPLIE ROT 10 11.4 M/L (STAPLE) IMPLANT
COVER SURGICAL LIGHT HANDLE (MISCELLANEOUS) ×6 IMPLANT
COVER TIP SHEARS 8 DVNC (MISCELLANEOUS) ×2 IMPLANT
COVER TIP SHEARS 8MM DA VINCI (MISCELLANEOUS) ×3
COVER WAND RF STERILE (DRAPES) ×3 IMPLANT
DECANTER SPIKE VIAL GLASS SM (MISCELLANEOUS) ×3 IMPLANT
DEVICE TROCAR PUNCTURE CLOSURE (ENDOMECHANICALS) IMPLANT
DRAIN CHANNEL 19F RND (DRAIN) ×1 IMPLANT
DRAPE ARM DVNC X/XI (DISPOSABLE) ×8 IMPLANT
DRAPE COLUMN DVNC XI (DISPOSABLE) ×2 IMPLANT
DRAPE DA VINCI XI ARM (DISPOSABLE) ×12
DRAPE DA VINCI XI COLUMN (DISPOSABLE) ×3
DRAPE SURG IRRIG POUCH 19X23 (DRAPES) ×3 IMPLANT
DRSG OPSITE POSTOP 4X10 (GAUZE/BANDAGES/DRESSINGS) IMPLANT
DRSG OPSITE POSTOP 4X6 (GAUZE/BANDAGES/DRESSINGS) ×1 IMPLANT
DRSG OPSITE POSTOP 4X8 (GAUZE/BANDAGES/DRESSINGS) IMPLANT
DRSG TEGADERM 2-3/8X2-3/4 SM (GAUZE/BANDAGES/DRESSINGS) ×11 IMPLANT
DRSG TEGADERM 4X4.75 (GAUZE/BANDAGES/DRESSINGS) ×1 IMPLANT
ELECT PENCIL ROCKER SW 15FT (MISCELLANEOUS) ×3 IMPLANT
ELECT REM PT RETURN 15FT ADLT (MISCELLANEOUS) ×3 IMPLANT
ENDOLOOP SUT PDS II  0 18 (SUTURE)
ENDOLOOP SUT PDS II 0 18 (SUTURE) IMPLANT
EVACUATOR SILICONE 100CC (DRAIN) ×1 IMPLANT
GAUZE SPONGE 2X2 8PLY STRL LF (GAUZE/BANDAGES/DRESSINGS) ×2 IMPLANT
GLOVE BIOGEL PI IND STRL 6 (GLOVE) IMPLANT
GLOVE BIOGEL PI INDICATOR 6 (GLOVE) ×2
GLOVE INDICATOR 8.0 STRL GRN (GLOVE) ×9 IMPLANT
GLOVE SURG ENC MOIS LTX SZ7.5 (GLOVE) ×2 IMPLANT
GLOVE SURG LTX SZ6.5 (GLOVE) ×2 IMPLANT
GLOVE SURG LTX SZ8 (GLOVE) ×9 IMPLANT
GOWN STRL REUS W/TWL LRG LVL3 (GOWN DISPOSABLE) ×2 IMPLANT
GOWN STRL REUS W/TWL XL LVL3 (GOWN DISPOSABLE) ×11 IMPLANT
GRASPER SUT TROCAR 14GX15 (MISCELLANEOUS) IMPLANT
HOLDER FOLEY CATH W/STRAP (MISCELLANEOUS) ×3 IMPLANT
IRRIG SUCT STRYKERFLOW 2 WTIP (MISCELLANEOUS) ×3
IRRIGATION SUCT STRKRFLW 2 WTP (MISCELLANEOUS) ×2 IMPLANT
KIT PROCEDURE DA VINCI SI (MISCELLANEOUS)
KIT PROCEDURE DVNC SI (MISCELLANEOUS) IMPLANT
KIT SIGMOIDOSCOPE (SET/KITS/TRAYS/PACK) ×1 IMPLANT
KIT TURNOVER KIT A (KITS) ×3 IMPLANT
NDL INSUFFLATION 14GA 120MM (NEEDLE) ×2 IMPLANT
NEEDLE INSUFFLATION 14GA 120MM (NEEDLE) ×3 IMPLANT
PACK CARDIOVASCULAR III (CUSTOM PROCEDURE TRAY) ×3 IMPLANT
PACK COLON (CUSTOM PROCEDURE TRAY) ×3 IMPLANT
PAD POSITIONING PINK XL (MISCELLANEOUS) ×3 IMPLANT
PORT LAP GEL ALEXIS MED 5-9CM (MISCELLANEOUS) ×3 IMPLANT
PROTECTOR NERVE ULNAR (MISCELLANEOUS) ×6 IMPLANT
RELOAD STAPLE 45 3.5 BLU DVNC (STAPLE) IMPLANT
RELOAD STAPLE 45 4.3 GRN DVNC (STAPLE) IMPLANT
RELOAD STAPLE 60 3.5 BLU DVNC (STAPLE) IMPLANT
RELOAD STAPLE 60 4.3 GRN DVNC (STAPLE) IMPLANT
RELOAD STAPLER 3.5X45 BLU DVNC (STAPLE) IMPLANT
RELOAD STAPLER 3.5X60 BLU DVNC (STAPLE) IMPLANT
RELOAD STAPLER 4.3X45 GRN DVNC (STAPLE) IMPLANT
RELOAD STAPLER 4.3X60 GRN DVNC (STAPLE) ×2 IMPLANT
RETRACTOR WND ALEXIS 18 MED (MISCELLANEOUS) IMPLANT
RTRCTR WOUND ALEXIS 18CM MED (MISCELLANEOUS)
SCISSORS LAP 5X35 DISP (ENDOMECHANICALS) ×3 IMPLANT
SEAL CANN UNIV 5-8 DVNC XI (MISCELLANEOUS) ×6 IMPLANT
SEAL XI 5MM-8MM UNIVERSAL (MISCELLANEOUS) ×12
SEALER VESSEL DA VINCI XI (MISCELLANEOUS) ×3
SEALER VESSEL EXT DVNC XI (MISCELLANEOUS) ×2 IMPLANT
SOLUTION ELECTROLUBE (MISCELLANEOUS) ×3 IMPLANT
SPONGE GAUZE 2X2 STER 10/PKG (GAUZE/BANDAGES/DRESSINGS) ×1
SPONGE LAP 18X18 RF (DISPOSABLE) ×1 IMPLANT
STAPLER 45 DA VINCI SURE FORM (STAPLE)
STAPLER 45 SUREFORM DVNC (STAPLE) IMPLANT
STAPLER 60 DA VINCI SURE FORM (STAPLE) ×3
STAPLER 60 SUREFORM DVNC (STAPLE) IMPLANT
STAPLER CANNULA SEAL DVNC XI (STAPLE) ×2 IMPLANT
STAPLER CANNULA SEAL XI (STAPLE) ×3
STAPLER ECHELON POWER CIR 29 (STAPLE) ×1 IMPLANT
STAPLER ECHELON POWER CIR 31 (STAPLE) IMPLANT
STAPLER RELOAD 3.5X45 BLU DVNC (STAPLE)
STAPLER RELOAD 3.5X45 BLUE (STAPLE)
STAPLER RELOAD 3.5X60 BLU DVNC (STAPLE)
STAPLER RELOAD 3.5X60 BLUE (STAPLE)
STAPLER RELOAD 4.3X45 GREEN (STAPLE)
STAPLER RELOAD 4.3X45 GRN DVNC (STAPLE)
STAPLER RELOAD 4.3X60 GREEN (STAPLE) ×3
STAPLER RELOAD 4.3X60 GRN DVNC (STAPLE) ×2
STOPCOCK 4 WAY LG BORE MALE ST (IV SETS) ×6 IMPLANT
SURGILUBE 2OZ TUBE FLIPTOP (MISCELLANEOUS) ×1 IMPLANT
SUT MNCRL AB 4-0 PS2 18 (SUTURE) ×4 IMPLANT
SUT PDS AB 1 CT1 27 (SUTURE) ×6 IMPLANT
SUT PROLENE 0 CT 2 (SUTURE) IMPLANT
SUT PROLENE 2 0 KS (SUTURE) ×1 IMPLANT
SUT PROLENE 2 0 SH DA (SUTURE) ×1 IMPLANT
SUT SILK 2 0 (SUTURE) ×3
SUT SILK 2 0 SH CR/8 (SUTURE) IMPLANT
SUT SILK 2-0 18XBRD TIE 12 (SUTURE) IMPLANT
SUT SILK 3 0 (SUTURE)
SUT SILK 3 0 SH CR/8 (SUTURE) ×3 IMPLANT
SUT SILK 3-0 18XBRD TIE 12 (SUTURE) IMPLANT
SUT V-LOC BARB 180 2/0GR6 GS22 (SUTURE) ×9
SUT VIC AB 3-0 SH 18 (SUTURE) IMPLANT
SUT VIC AB 3-0 SH 27 (SUTURE)
SUT VIC AB 3-0 SH 27XBRD (SUTURE) IMPLANT
SUT VICRYL 0 UR6 27IN ABS (SUTURE) ×3 IMPLANT
SUTURE V-LC BRB 180 2/0GR6GS22 (SUTURE) IMPLANT
SYR 10ML ECCENTRIC (SYRINGE) ×3 IMPLANT
SYS LAPSCP GELPORT 120MM (MISCELLANEOUS)
SYSTEM LAPSCP GELPORT 120MM (MISCELLANEOUS) IMPLANT
TRAY FOLEY MTR SLVR 16FR STAT (SET/KITS/TRAYS/PACK) ×3 IMPLANT
TROCAR ADV FIXATION 5X100MM (TROCAR) ×3 IMPLANT
TUBING CONNECTING 10 (TUBING) ×6 IMPLANT
TUBING INSUFFLATION 10FT LAP (TUBING) ×3 IMPLANT

## 2021-01-17 NOTE — Interval H&P Note (Signed)
History and Physical Interval Note:  01/17/2021 7:30 AM  [name]  has presented today for surgery, with the diagnosis of COLOSTOMY FOR COLON RESECTION. DESIRE FOR OSTOMY TAKEDOWN.  The various methods of treatment have been discussed with the patient and family. After consideration of risks, benefits and other options for treatment, the patient has consented to  Procedure(s): XI ROBOTIC ASSISTED OSTOMY TAKEDOWN (N/A) LYSIS OF ADHESION WITH RIGID PROCTOSCOPY (N/A) as a surgical intervention.  The patient's history has been reviewed, patient examined, no change in status, stable for surgery.  I have reviewed the patient's chart and labs.  Questions were answered to the patient's satisfaction.    I have re-reviewed the the patient's records, history, medications, and allergies.  I have re-examined the patient.  I again discussed intraoperative plans and goals of post-operative recovery.  The patient agrees to proceed.  [name]  08-27-1923 [id number]  Patient Care Team: [name] Minium, MD as PCP - General (Family Medicine) [name] Day, MD as Consulting Physician (Orthopedic Surgery) [name] Brownie, MD as Consulting Physician (Dermatology) [name] Pickerel, MD as Consulting Physician (General Surgery) [name] Planas, MD as Consulting Physician (Orthopedic Surgery) [name] Boston, MD as Consulting Physician (Colon and Rectal Surgery) [name] Pound, MD as Consulting Physician (Rheumatology) [name] Sprang, MD as Consulting Physician (Cardiology)  Patient Active Problem List   Diagnosis Date Noted   Polymyalgia rheumatica ([place]) 01/11/2021   Hip fracture ([place]) 12/02/2020   (HFpEF) heart failure with preserved ejection fraction ([place]) 10/27/2020   Injury of extensor or abductor muscle, fascia, or tendon of thumb at forearm level 08/03/2020   Orthostatic hypotension 05/24/2020   Acute blood loss anemia 05/19/2020   Traumatic hematoma of left hip 05/19/2020   Current chronic  use of systemic steroids 05/19/2020   Advanced age [age]2019   Secondary hypercoagulable state ([place]) 03/08/2020   Hypokalemia 02/15/2020   Colostomy status ([place]) 01/26/2020   Diverticulitis of colon with perforation 01/26/2020   Colostomy in place [facility]) 01/26/2020   Anemia    Debility 01/14/2020   Physical debility 01/14/2020   Persistent atrial fibrillation ([facility])    Closed fracture of left distal radius 12/29/2019   Recurrent falls 12/29/2019   Positive ANA (antinuclear antibody) 12/29/2019   Osteoarthritis 11/13/2017   Hypertriglyceridemia 01/29/2017   Excessive gas 01/09/2017   Physical exam 07/31/2016   Elevated lipase 03/06/2016   Chest pain 03/06/2016   Osteopenia 02/08/2016   Hypothyroidism 01/26/2016   HTN (hypertension) 01/26/2016   Left leg numbness 10/06/2012    Past Medical History:  Diagnosis Date   DVT (deep venous thrombosis) ([place]) 09/2015   RLE   Dysrhythmia    Afib   GERD (gastroesophageal reflux disease)    Hypertension    Hypothyroidism    Melanoma ([place]) 06/16/2017   Facial melanoma, removed by Dr. Harvel [name]   Peripheral vascular disease [facility])    peripheral neuropathy    Past Surgical History:  Procedure Laterality Date   ABDOMINAL HYSTERECTOMY     APPENDECTOMY     BACK SURGERY     BREAST SURGERY     left breast biopsy   CARDIOVERSION N/A 05/09/2020   Procedure: CARDIOVERSION;  Surgeon: Skeet Latch, MD;  Location: [place];  Service: Cardiovascular;  Laterality: N/A;   COLOSTOMY N/A 01/06/2020   Procedure: Colostomy;  Surgeon: [name] Pickerel, MD;  Location: [place];  Service: General;  Laterality: N/A;   EYE SURGERY     cataract   HARDWARE REMOVAL Left 01/01/2020  Procedure: HARDWARE REMOVAL;  Surgeon: [name] Planas, MD;  Location: [place];  Service: Orthopedics;  Laterality: Left;   HIP ARTHROPLASTY Left 12/03/2020   Procedure: ARTHROPLASTY BIPOLAR HIP (HEMIARTHROPLASTY);  Surgeon: [name] Butters, MD;  Location: [place];  Service:  Orthopedics;  Laterality: Left;   HIP ARTHROPLASTY Left    partial   LAPAROTOMY N/A 01/06/2020   Procedure: Exploratory Laparotomy, Open Sigmoid colectomy;  Surgeon: [name] Pickerel, MD;  Location: [place];  Service: General;  Laterality: N/A;   LIPOMA EXCISION Left 04/25/2015   Procedure: EXCISION OF LIPOMA LEFT ARM;  Surgeon: [name] Mesa, MD;  Location: [place];  Service: General;  Laterality: Left;   LUMBAR LAMINECTOMY/DECOMPRESSION MICRODISCECTOMY  11/20/2011   Procedure: LUMBAR LAMINECTOMY/DECOMPRESSION MICRODISCECTOMY;  Surgeon: [name];  Location: [facility];  Service: Orthopedics;  Laterality: N/A;  Decompressive Laminectomy L2 to the Sacrum (X-Ray)   OPEN REDUCTION INTERNAL FIXATION (ORIF) DISTAL RADIAL FRACTURE Left 01/01/2020   Procedure: OPEN REDUCTION INTERNAL FIXATION (ORIF) DISTAL RADIAL FRACTURE;  Surgeon: [name] Planas, MD;  Location: [place];  Service: Orthopedics;  Laterality: Left;   OTHER SURGICAL HISTORY     left wrist surgery - has plate in left wrist    OTHER SURGICAL HISTORY     right knee surgery due to torn cartilage    TONSILLECTOMY     WRIST FRACTURE SURGERY Left     Social History   Socioeconomic History   Marital status: Widowed    Spouse name: Not on file   Number of children: Not on file   Years of education: Not on file   Highest education level: Not on file  Occupational History   Not on file  Tobacco Use   Smoking status: Never Smoker   Smokeless tobacco: Never Used  Vaping Use   Vaping Use: Never used  Substance and Sexual Activity   Alcohol use: No   Drug use: No   Sexual activity: Never  Other Topics Concern   Not on file  Social History Narrative   Not on file   Social Determinants of Health   Financial Resource Strain: Low Risk    Difficulty of Paying Living Expenses: Not hard at all  Food Insecurity: No Food Insecurity   Worried About Charity fundraiser in the Last Year: Never true   [place] in the Last  Year: Never true  Transportation Needs: No Transportation Needs   Lack of Transportation (Medical): No   Lack of Transportation (Non-Medical): No  Physical Activity: Insufficiently Active   Days of Exercise per Week: 2 days   Minutes of Exercise per Session: 60 min  Stress: No Stress Concern Present   Feeling of Stress : Not at all  Social Connections: Moderately Isolated   Frequency of Communication with Friends and Family: More than three times a week   Frequency of Social Gatherings with Friends and Family: Once a week   Attends Religious Services: More than 4 times per year   Active Member of [employer] or Organizations: No   Attends Archivist Meetings: Never   Marital Status: Widowed  Human resources officer Violence: Not At Risk   Fear of Current or Ex-Partner: No   Emotionally Abused: No   Physically Abused: No   Sexually Abused: No    Family History  Problem Relation Age of Onset   Cancer Mother        BREAST   COPD Father    Emphysema Father  Cancer Daughter        breast    Medications Prior to Admission  Medication Sig Dispense Refill Last Dose   acetaminophen (TYLENOL) 325 MG tablet Take 1-2 tablets (325-650 mg total) by mouth every 4 (four) hours as needed for mild pain. (Patient not taking: No sig reported)      acetaminophen (TYLENOL) 500 MG tablet Take 500 mg by mouth at bedtime.   01/16/2021 at Unknown time   apixaban (ELIQUIS) 5 MG TABS tablet Take 1 tablet (5 mg total) by mouth 2 (two) times daily. 180 tablet 3 01/12/2021 at 1800   diltiazem (DILACOR XR) 120 MG 24 hr capsule Take 1 capsule (120 mg total) by mouth daily.  1 01/17/2021 at Unknown time   docusate sodium (COLACE) 100 MG capsule Take 1 capsule (100 mg total) by mouth 2 (two) times daily. 60 capsule 0 Past Month at Unknown time   gabapentin (NEURONTIN) 300 MG capsule Take 300 mg by mouth at bedtime.   01/16/2021 at Unknown time   levothyroxine (SYNTHROID) 50 MCG tablet TAKE 1 TABLET BY MOUTH EVERY [name]  (Patient taking differently: Take 50 mcg by mouth daily before breakfast.) 90 tablet 1 01/17/2021 at Unknown time   losartan (COZAAR) 50 MG tablet TAKE 1 TABLET BY MOUTH EVERY [name] (Patient taking differently: Patient is taking 75mg  1x daily) 90 tablet 1 01/16/2021 at Unknown time   nadolol (CORGARD) 80 MG tablet TAKE 1 TABLET BY MOUTH EVERY [name] (Patient taking differently: Take 80 mg by mouth in the morning. Patient taking 40mg  daily) 90 tablet 1 01/17/2021 at Unknown time   pantoprazole (PROTONIX) 40 MG tablet Take 1 tablet (40 mg total) by mouth daily. (Patient taking differently: Take 40 mg by mouth 2 (two) times daily before a meal.) 90 tablet 1 01/16/2021 at Unknown time   predniSONE (DELTASONE) 1 MG tablet Take 4 tablets (4 mg total) by mouth daily with lunch.   01/16/2021 at Unknown time   tizanidine (ZANAFLEX) 2 MG capsule Take 1 capsule (2 mg total) by mouth 2 (two) times daily as needed for muscle spasms. 15 capsule 0 Past Week at Unknown time   alum & mag hydroxide-simeth (MAALOX/MYLANTA) 200-200-20 MG/5ML suspension Take 30 mLs by mouth every 6 (six) hours as needed for indigestion or heartburn. (Patient not taking: No sig reported) 355 mL 0 Unknown at Unknown time   bisacodyl (DULCOLAX) 10 MG suppository Place 1 suppository (10 mg total) rectally daily as needed for moderate constipation. (Patient not taking: No sig reported) 12 suppository 0 Unknown at Unknown time   Multiple Vitamins-Minerals (PRESERVISION AREDS 2 PO) Take 1 tablet by mouth daily at 12 noon. (Patient not taking: No sig reported)   More than a month at Unknown time   polyethylene glycol (MIRALAX / GLYCOLAX) 17 g packet Take 17 g by mouth See admin instructions. Mix 17 grams of powder into 8 ounces of water and drink two times a [name] and an additional 17 grams once daily as needed for constiptaion   Unknown at Unknown time   senna-docusate (SENOKOT-S) 8.6-50 MG tablet Take 1 tablet by mouth at bedtime. 30 tablet 0 More than a month at  Unknown time   senna-docusate (SENOKOT-S) 8.6-50 MG tablet Take 1 tablet by mouth at bedtime as needed for mild constipation. (Patient not taking: No sig reported)   More than a month at Unknown time   sucralfate (CARAFATE) 1 g tablet Take 1 tablet (1 g total) by mouth with breakfast, with  lunch, and with evening meal. 90 tablet 0 More than a month at Unknown time    Current Facility-Administered Medications  Medication Dose Route Frequency Provider Last Rate Last Admin   bupivacaine liposome (EXPAREL) 1.3 % injection 266 mg  20 mL Infiltration Once Theodis Shove M, RPH       bupivacaine liposome (EXPAREL) 1.3 % injection 266 mg  20 mL Infiltration Once [name] Boston, MD       cefoTEtan (CEFOTAN) 2 g in sodium chloride 0.9 % 100 mL IVPB  2 g Intravenous On Call to OR [name] Boston, MD       Chlorhexidine Gluconate Cloth 2 % PADS 6 each  6 each Topical Once [name] Boston, MD       And   Chlorhexidine Gluconate Cloth 2 % PADS 6 each  6 each Topical Once [name] Boston, MD       clindamycin (CLEOCIN) 900 mg, gentamicin (GARAMYCIN) 240 mg in sodium chloride 0.9 % 1,000 mL for intraperitoneal lavage   Irrigation To OR [name] Boston, MD       Derrill Memo ON 01/18/2021] feeding supplement (ENSURE PRE-SURGERY) liquid 296 mL  296 mL Oral Once [name] Boston, MD       feeding supplement (ENSURE PRE-SURGERY) liquid 592 mL  592 mL Oral Once [name] Boston, MD       lactated ringers infusion   Intravenous Continuous Woodrum, Chelsey L, MD         Allergies  Allergen Reactions   Keflex [Cephalexin] Nausea Only and Other (See Comments)    Pt ended up in ER w/ CHEST PAIN   Codeine Other (See Comments)    "Nightmares, imagined things"    BP (!) 156/77   Pulse 65   Temp 98 F (36.7 C) (Oral)   Resp 16   Ht 5\' 6"  (1.676 m)   Wt 68.8 kg   SpO2 96%   BMI 24.47 kg/m   Labs: Results for orders placed or performed during the hospital encounter of 01/15/21 (from the past 48 hour(s))  SARS CORONAVIRUS 2 (TAT  6-24 HRS) Nasopharyngeal Nasopharyngeal Swab     Status: None   Collection Time: 01/15/21 12:42 PM   Specimen: Nasopharyngeal Swab  Result Value Ref Range   SARS Coronavirus 2 NEGATIVE NEGATIVE    Comment: (NOTE) SARS-CoV-2 target nucleic acids are NOT DETECTED.  The SARS-CoV-2 RNA is generally detectable in upper and lower respiratory specimens during the acute phase of infection. Negative results do not preclude SARS-CoV-2 infection, do not rule out co-infections with other pathogens, and should not be used as the sole basis for treatment or other patient management decisions. Negative results must be combined with clinical observations, patient history, and epidemiological information. The expected result is Negative.  Fact Sheet for Patients: SugarRoll.be  Fact Sheet for Healthcare Providers: https://www.woods-mathews.com/  This test is not yet approved or cleared by the [place] FDA and  has been authorized for detection and/or diagnosis of SARS-CoV-2 by FDA under an Emergency Use Authorization (EUA). This EUA will remain  in effect (meaning this test can be used) for the duration of the COVID-19 declaration under Se ction 564(b)(1) of the Act, 21 U.S.C. section 360bbb-3(b)(1), unless the authorization is terminated or revoked sooner.  Performed at [facility], [place] [address] [place], [place] [address]     Imaging / Studies: No results found.   [name], M.D., F.A.C.S. Gastrointestinal and Minimally Invasive Surgery [facility] [address],  [address] [place], [place] [address] [phone] Main / Paging  01/17/2021 7:30 AM    [name]

## 2021-01-17 NOTE — Anesthesia Postprocedure Evaluation (Signed)
Anesthesia Post Note  Patient: [name]  Procedure(s) Performed: XI ROBOTIC ASSISTED OSTOMY TAKEDOWN, LYSIS OF ADHESIONS, RECTOSIGMOID RESECTION, BILATERAL TAP BLOCK (N/A Abdomen) RIGID PROCTOSCOPY (N/A )     Patient location during evaluation: PACU Anesthesia Type: General Level of consciousness: awake and alert Pain management: pain level controlled Vital Signs Assessment: post-procedure vital signs reviewed and stable Respiratory status: spontaneous breathing, nonlabored ventilation, respiratory function stable and patient connected to nasal cannula oxygen Cardiovascular status: blood pressure returned to baseline and stable Postop Assessment: no apparent nausea or vomiting Anesthetic complications: no   No complications documented.  Last Vitals:  Vitals:   01/17/21 1445 01/17/21 1536  BP: 130/66 (!) 158/81  Pulse: 69 79  Resp: 13 14  Temp: (!) 36.4 C 36.7 C  SpO2: 96% 98%    Last Pain:  Vitals:   01/17/21 1536  TempSrc: Oral  PainSc: 0-No pain                 Afsana Liera L Tyrion Glaude

## 2021-01-17 NOTE — Op Note (Signed)
01/17/2021  12:58 PM  PATIENT:  [name]  87 y.o. female  Patient Care Team: [name], MD as PCP - General (Family Medicine) Susa Day, MD as Consulting Physician (Orthopedic Surgery) Druscilla Brownie, MD as Consulting Physician (Dermatology) Greer Pickerel, MD as Consulting Physician (General Surgery) Iran Planas, MD as Consulting Physician (Orthopedic Surgery) Michael Boston, MD as Consulting Physician (Colon and Rectal Surgery) Gavin Pound, MD as Consulting Physician (Rheumatology) Deboraha Sprang, MD as Consulting Physician (Cardiology)  PRE-OPERATIVE DIAGNOSIS:  COLOSTOMY FOR COLON RESECTION. DESIRE FOR OSTOMY TAKEDOWN  POST-OPERATIVE DIAGNOSIS:   COLOSTOMY FOR COLON RESECTION. DESIRE FOR OSTOMY TAKEDOWN PARACOLOSTOMY STOMAL HERNIA INCISIONAL HERNIAS  PROCEDURE:  XI ROBOTIC ASSISTED OSTOMY TAKEDOWN ROBOTIC, LYSIS OF ADHESIONS RECTOSIGMOID RESECTION BILATERAL TAP BLOCK RIGID PROCTOSCOPY  SURGEON:  [name], MD  ASSISTANT:  Nadeen Landau, MD Leighton Ruff, MD, FACS. Kaylyn Lim, MD An experienced assistant was required given the standard of surgical care given the complexity of the case.  This assistant was needed for exposure, dissection, suctioning, retraction, instrument exchange, etc.     ANESTHESIA:   local and general  Nerve block provided with liposomal bupivacaine (Experel) mixed with 0.25% bupivacaine as a Bilateral TAP block x 47mL each side at the level of the transverse abdominis & preperitoneal spaces along the flank at the anterior axillary line, from subcostal ridge to iliac crest under laparoscopic guidance    EBL:  Total I/O In: 1250 [I.V.:1000; IV Piggyback:250] Out: 300 [Urine:200; Blood:100]  Delay start of Pharmacological VTE agent (>24hrs) due to surgical blood loss or risk of bleeding:  no  DRAINS: 19 Fr Blake drain in pelvis   SPECIMEN:     DISPOSITION OF SPECIMEN:  PATHOLOGY  COUNTS:  YES  PLAN  OF CARE: Admit to inpatient   PATIENT DISPOSITION:  PACU - hemodynamically stable.  INDICATION: Pleasant patient status post colectomy with end ostomy.  The patient has recovered from that surgery and has understandably requested ostomy takedown.  Medically stabilized and felt reasonable to proceed.   I discussed the procedure with the patient:  The anatomy & physiology of the digestive tract was discussed.  The pathophysiology was discussed.  Possibility of remaining with an ostomy permanently was discussed.  I offered colostomy takedown.  Laparoscopic & open techniques were discussed.   Risks such as bleeding, infection, abscess, leak, reoperation, possible re-ostomy, injury to other organs, hernia, heart attack, death, and other risks were discussed.   I noted a good likelihood this will help address the problem.  Goals of post-operative recovery were discussed as well.  We will work to minimize complications.  Questions were answered.  The patient expresses understanding & wishes to proceed with surgery.  OR FINDINGS:   Moderate omental and small bowel adhesions to anterior abdominal wall and pelvis.  Omentum within Swiss cheese type midline hernias.  Moderate parastomal hernia.  Nothing incarcerated.  Rather fibrotic and narrowed proximal rectum.  Required rectosigmoid resection and ultimately anastomosis to mid rectum.  It is a 29 EEA stapled anastomosis 8-10 cm from anal verge.  Closest edge posteriorly.  DESCRIPTION:   Informed consent was confirmed.   The patient received IV antibiotics & underwent general anesthesia without any difficulty.  Foley catheter was sterilely placed. SCDs were active during the entire case.  Proceed with the surgical timeout.  I did digital rectal examination and evacuated old mucus balls.  I felt no abnormalities up to 9-10 cm.   I did rigid proctoscopy and noted  some narrowing around 13-14 cm from the anal verge.  I sutured the stomy closed using a pursestring   stitch.  The abdomen was prepped and draped in a sterile fashion.  A surgical timeout confirmed our plan.    I placed a port in the upper abdomen using Varess entry technique the patient in steep reverse Trendelenburg.  Placed a robotic port in the right upper abdomen.  Camera inspection noted that entry was clean.  I placed extra ports under laparoscopic visualization.  Patient was carefully positioned in Trendelenburg and left side up positioning.  Robot was carefully docked.  Proceeded with lysis of adhesions to make sure there were no adhesions on the anterior abdominal wall, to the ostomy, and to the pelvis.  Inspected small bowel loops to make sure no significant interloop adhesions or transition zones noted.  Freed off interloop adhesions.  I mobilized the left colon in a lateral medial fashion and mobilized the descending colon going up into the paracolostomy stomal hernia.  Turned attention to the pelvis.  I mobilized the rectosigmoid stump that had been marked with blue suture.  Appeared to the distal sigmoid was involved.  Mobilized laterally and anteriorly to help straighten out some kinking.  I repeated rigid proctoscopy and noted there was still some stricturing in the mid rectum distal to the staple line despite it being straight straightened out and the visceral peritoneum and peritoneal reflection freed off.  I mobilized the mesial rectum off the pelvis to help mobilize the rectum posteriorly and help straighten it out as well.  I transected the mesorectum off of the rectosigmoid region and came down to the mid rectal stump.  I did rigid proctoscopy to confirm that I was mobilized distal to the strictured off area.  I made a biconcave curvilinear incision transversely around the ostomy.  I got into the subcutaneous tissues.  I used careful focused right angle dissection and sharp dissection.  Some focused cautery dissection as well.  That helped to free adhesions to the subcutaneous tisses &  fascia.  I was able to enter into the peritoneum focally.  I did  a gentle finger sweep.  Gradually came around circumferentially and freed the bowel from remaining adhesions to the abdominal wall.  We were able eviscerate some bowel proximally and distally.    I clamped the colon at the point of resection using a reusable pursestringer device.  Passed a 2-0 Keith needle. I transected at the descending/sigmoid junction with a scalpel. I got healthy bleeding mucosa.  We sent the rectosigmoid colon specimen off to go to pathology.  We sized the colon orifice.  I chose a 29 EEA anvil stapler system.  I reinforced the prolene pursestring with interrupted silk suture.  I placed the anvil to the open end of the proximal remaining colon and closed around it using the pursestring.    We did copious irrigation with crystalloid solution.  Hemostasis was good.  The distal end of the colon at the handle easily reached down to the rectal stump, therefore, splenic flexure mobilization was not needed.      We repositioned and docked the Xi.  Placed a wound retractor with a 17mm stapler port at the old colostomy site.  I stapled off the mid rectum using a 60 mm green loaded robotic stapler to good result.  Dr Dema Severin scrubbed down and did gentle anal dilation and advanced the EEA sizers to gently dilate the rectum out.  Unfortunately felt some resistance and  kinking and became apparent that the sizer had perforated through the end of the rectal stump staple line.  I mobilized the rectum a little more distally.  I transected the staple line off.  Transected to the rectum to have more oblique opening.  I did rectal examination.  Sizers easily passed up to this region, implying no more stricture concern distal to the proximal end of the mid rectum.  I dilated advanced the EEA stapler up and brought the spike out through the open end of the rectal stump. Dr. Hassell Done came in and held the stapler in place as Dr. Dema Severin helped with  robotic laparoscopic assistance.  I transected the rectal stump circumferentially to have healthier edges.  I closed the rectal stump using a 2 OV lock suture in a pursestring fashion.  Posterior edge was a little separated so I did another stitch to help bring that to the staple line.      I attached the anvil of the proximal colon the spike of the stapler. Anvil was tightened down and held clamped for 60 seconds. The EEA stapler was fired and held clamped for 30 seconds. The stapler was released & removed. We noted 2 excellent anastomotic rings but the distal ring did have some posterior midline thinning/separation.  Dr. Dema Severin did rigid proctoscopy noted the anastomosis was at 9 cm from the anal verge consistent with the proximal rectum.  We did a pelvic irrigation with sterile isotonic fluid & held that for the pelvic air leak test.  The rectum was insufflated the rectum while clamping the colon proximal to that anastomosis.  There was bubbling.  Mobilized the colon more as well as the mesorectum mesocolon.  I rolled the anastomosis and posteriorly there was a 5 mm separation near the posterior midline.  I used a 2-0 V lock and closed this defect in 2 layers using healthy tissue using robotic suturing.  We repeated irrigation and inspection and repeated an air leak test.  Good insufflation across anastomosis without any bubbles.  Consistent with a negative air leak test. There was no tension of mesentery or bowel at the anastomosis.   Tissues looked viable.  I brought some of the mesocolon from the proximal end that was redundant and overlaid and wrapped around the EEA anastomosis.  I continued a V lock to help provide a mesentery O-pexy of the EEA anastomosis posteriorly laterally and anteriorly to good result.  We did irrigation.  Hemostasis was good.   Ureters & bowel uninjured.  The anastomosis looked healthy.  I decided to leave a drain given the more distal anastomosis and need for sewing closure.   Omentum brought down as well.  I removed CO2 gas out through the ports.  We undocked the robot.  Digital rectal examination noted that the anastomosis was at the very tip of my finger.  8 cm posteriorly and 10 cm anteriorly.  We changed gown and gloves.  The patient was re-draped.  Sterile unused instruments were used from this point out per colon SSI prevention protocol.  We closed the port sites using Monocryl stitch and sterile dressing.  I closed the abdominal wall ostomy wound using #1 PDS running fascial closure.  I closed it vertically since that seem to be the least amount of tension.  Drain was secured with 2-0 Prolene suture I closed the skin with some interrupted Monocryl stitches. I placed antibiotic-soaked wicks into the closure at the corners x2. I placed a sterile dressing.  Patient is being extubated go to recovery room. I discussed postop care with the patient & family in detail the office & in the holding area. Instructions are written. I discussed operative findings, updated the patient's status, discussed probable steps to recovery, and gave postoperative recommendations to the patient's daughter, Lubertha Sayres.  Recommendations were made.  Questions were answered.  She expressed understanding & appreciation.  [name], M.D., F.A.C.S. Gastrointestinal and Minimally Invasive Surgery [facility] [address] [place], [place] [address] [phone] Main / Paging

## 2021-01-17 NOTE — Transfer of Care (Signed)
Immediate Anesthesia Transfer of Care Note  Patient: [name]  Procedure(s) Performed: XI ROBOTIC ASSISTED OSTOMY TAKEDOWN, LYSIS OF ADHESIONS, RECTOSIGMOID RESECTION, BILATERAL TAP BLOCK (N/A Abdomen) RIGID PROCTOSCOPY (N/A )  Patient Location: PACU  Anesthesia Type:General  Level of Consciousness: awake, alert  and oriented  Airway & Oxygen Therapy: Patient Spontanous Breathing and Patient connected to face mask  Post-op Assessment: Report given to RN and Post -op Vital signs reviewed and stable  Post vital signs: Reviewed and stable  Last Vitals:  Vitals Value Taken Time  BP 153/77 01/17/21 1304  Temp    Pulse 60 01/17/21 1306  Resp 12 01/17/21 1306  SpO2 100 % 01/17/21 1306  Vitals shown include unvalidated device data.  Last Pain:  Vitals:   01/17/21 0707  TempSrc:   PainSc: 0-No pain      Patients Stated Pain Goal: 4 (96/04/54 0981)  Complications: No complications documented.

## 2021-01-17 NOTE — Discharge Instructions (Signed)
SURGERY: POST OP INSTRUCTIONS °(Surgery for small bowel obstruction, colon resection, etc) ° ° °###################################################################### ° °EAT °Gradually transition to a high fiber diet with a fiber supplement over the next few days after discharge ° °WALK °Walk an hour a day.  Control your pain to do that.   ° °CONTROL PAIN °Control pain so that you can walk, sleep, tolerate sneezing/coughing, go up/down stairs. ° °HAVE A BOWEL MOVEMENT DAILY °Keep your bowels regular to avoid problems.  OK to try a laxative to override constipation.  OK to use an antidairrheal to slow down diarrhea.  Call if not better after 2 tries ° °CALL IF YOU HAVE PROBLEMS/CONCERNS °Call if you are still struggling despite following these instructions. °Call if you have concerns not answered by these instructions ° °###################################################################### ° ° °DIET °Follow a light diet the first few days at home.  Start with a bland diet such as soups, liquids, starchy foods, low fat foods, etc.  If you feel full, bloated, or constipated, stay on a ful liquid or pureed/blenderized diet for a few days until you feel better and no longer constipated. °Be sure to drink plenty of fluids every day to avoid getting dehydrated (feeling dizzy, not urinating, etc.). °Gradually add a fiber supplement to your diet over the next week.  Gradually get back to a regular solid diet.  Avoid fast food or heavy meals the first week as you are more likely to get nauseated. °It is expected for your digestive tract to need a few months to get back to normal.  It is common for your bowel movements and stools to be irregular.  You will have occasional bloating and cramping that should eventually fade away.  Until you are eating solid food normally, off all pain medications, and back to regular activities; your bowels will not be normal. °Focus on eating a low-fat, high fiber diet the rest of your life  (See Getting to Good Bowel Health, below). ° °CARE of your INCISION or WOUND °It is good for closed incision and even open wounds to be washed every day.  Shower every day.  Short baths are fine.  Wash the incisions and wounds clean with soap & water.    °If you have a closed incision(s), wash the incision with soap & water every day.  You may leave closed incisions open to air if it is dry.   You may cover the incision with clean gauze & replace it after your daily shower for comfort. °If you have skin tapes (Steristrips) or skin glue (Dermabond) on your incision, leave them in place.  They will fall off on their own like a scab.  You may trim any edges that curl up with clean scissors.  If you have staples, set up an appointment for them to be removed in the office in 10 days after surgery.  °If you have a drain, wash around the skin exit site with soap & water and place a new dressing of gauze or band aid around the skin every day.  Keep the drain site clean & dry.    °If you have an open wound with packing, see wound care instructions.  In general, it is encouraged that you remove your dressing and packing, shower with soap & water, and replace your dressing once a day.  Pack the wound with clean gauze moistened with normal (0.9%) saline to keep the wound moist & uninfected.  Pressure on the dressing for 30 minutes will stop most wound   bleeding.  Eventually your body will heal & pull the open wound closed over the next few months.  °Raw open wounds will occasionally bleed or secrete yellow drainage until it heals closed.  Drain sites will drain a little until the drain is removed.  Even closed incisions can have mild bleeding or drainage the first few days until the skin edges scab over & seal.   °If you have an open wound with a wound vac, see wound vac care instructions. ° ° ° ° °ACTIVITIES as tolerated °Start light daily activities --- self-care, walking, climbing stairs-- beginning the day after surgery.   Gradually increase activities as tolerated.  Control your pain to be active.  Stop when you are tired.  Ideally, walk several times a day, eventually an hour a day.   °Most people are back to most day-to-day activities in a few weeks.  It takes 4-8 weeks to get back to unrestricted, intense activity. °If you can walk 30 minutes without difficulty, it is safe to try more intense activity such as jogging, treadmill, bicycling, low-impact aerobics, swimming, etc. °Save the most intensive and strenuous activity for last (Usually 4-8 weeks after surgery) such as sit-ups, heavy lifting, contact sports, etc.  Refrain from any intense heavy lifting or straining until you are off narcotics for pain control.  You will have off days, but things should improve week-by-week. °DO NOT PUSH THROUGH PAIN.  Let pain be your guide: If it hurts to do something, don't do it.  Pain is your body warning you to avoid that activity for another week until the pain goes down. °You may drive when you are no longer taking narcotic prescription pain medication, you can comfortably wear a seatbelt, and you can safely make sudden turns/stops to protect yourself without hesitating due to pain. °You may have sexual intercourse when it is comfortable. If it hurts to do something, stop. ° °MEDICATIONS °Take your usually prescribed home medications unless otherwise directed.   °Blood thinners:  °Usually you can restart any strong blood thinners after the second postoperative day.  It is OK to take aspirin right away.    ° If you are on strong blood thinners (warfarin/Coumadin, Plavix, Xerelto, Eliquis, Pradaxa, etc), discuss with your surgeon, medicine PCP, and/or cardiologist for instructions on when to restart the blood thinner & if blood monitoring is needed (PT/INR blood check, etc).   ° ° °PAIN CONTROL °Pain after surgery or related to activity is often due to strain/injury to muscle, tendon, nerves and/or incisions.  This pain is usually  short-term and will improve in a few months.  °To help speed the process of healing and to get back to regular activity more quickly, DO THE FOLLOWING THINGS TOGETHER: °1. Increase activity gradually.  DO NOT PUSH THROUGH PAIN °2. Use Ice and/or Heat °3. Try Gentle Massage and/or Stretching °4. Take over the counter pain medication °5. Take Narcotic prescription pain medication for more severe pain ° °Good pain control = faster recovery.  It is better to take more medicine to be more active than to stay in bed all day to avoid medications. °1.  Increase activity gradually °Avoid heavy lifting at first, then increase to lifting as tolerated over the next 6 weeks. °Do not “push through” the pain.  Listen to your body and avoid positions and maneuvers than reproduce the pain.  Wait a few days before trying something more intense °Walking an hour a day is encouraged to help your body recover faster   and more safely.  Start slowly and stop when getting sore.  If you can walk 30 minutes without stopping or pain, you can try more intense activity (running, jogging, aerobics, cycling, swimming, treadmill, sex, sports, weightlifting, etc.) °Remember: If it hurts to do it, then don’t do it! °2. Use Ice and/or Heat °You will have swelling and bruising around the incisions.  This will take several weeks to resolve. °Ice packs or heating pads (6-8 times a day, 30-60 minutes at a time) will help sooth soreness & bruising. °Some people prefer to use ice alone, heat alone, or alternate between ice & heat.  Experiment and see what works best for you.  Consider trying ice for the first few days to help decrease swelling and bruising; then, switch to heat to help relax sore spots and speed recovery. °Shower every day.  Short baths are fine.  It feels good!  Keep the incisions and wounds clean with soap & water.   °3. Try Gentle Massage and/or Stretching °Massage at the area of pain many times a day °Stop if you feel pain - do not  overdo it °4. Take over the counter pain medication °This helps the muscle and nerve tissues become less irritable and calm down faster °Choose ONE of the following over-the-counter anti-inflammatory medications: °Acetaminophen 500mg tabs (Tylenol) 1-2 pills with every meal and just before bedtime (avoid if you have liver problems or if you have acetaminophen in you narcotic prescription) °Naproxen 220mg tabs (ex. Aleve, Naprosyn) 1-2 pills twice a day (avoid if you have kidney, stomach, IBD, or bleeding problems) °Ibuprofen 200mg tabs (ex. Advil, Motrin) 3-4 pills with every meal and just before bedtime (avoid if you have kidney, stomach, IBD, or bleeding problems) °Take with food/snack several times a day as directed for at least 2 weeks to help keep pain / soreness down & more manageable. °5. Take Narcotic prescription pain medication for more severe pain °A prescription for strong pain control is often given to you upon discharge (for example: oxycodone/Percocet, hydrocodone/Norco/Vicodin, or tramadol/Ultram) °Take your pain medication as prescribed. °Be mindful that most narcotic prescriptions contain Tylenol (acetaminophen) as well - avoid taking too much Tylenol. °If you are having problems/concerns with the prescription medicine (does not control pain, nausea, vomiting, rash, itching, etc.), please call us [phone] to see if we need to switch you to a different pain medicine that will work better for you and/or control your side effects better. °If you need a refill on your pain medication, you must call the office before 4 pm and on weekdays only.  By federal law, prescriptions for narcotics cannot be called into a pharmacy.  They must be filled out on paper & picked up from our office by the patient or authorized caretaker.  Prescriptions cannot be filled after 4 pm nor on weekends.   ° °WHEN TO CALL US [phone] °Severe uncontrolled or worsening pain  °Fever over 101 F (38.5 C) °Concerns with  the incision: Worsening pain, redness, rash/hives, swelling, bleeding, or drainage °Reactions / problems with new medications (itching, rash, hives, nausea, etc.) °Nausea and/or vomiting °Difficulty urinating °Difficulty breathing °Worsening fatigue, dizziness, lightheadedness, blurred vision °Other concerns °If you are not getting better after two weeks or are noticing you are getting worse, contact our office [phone] for further advice.  We may need to adjust your medications, re-evaluate you in the office, send you to the emergency room, or see what other things we can do to help. °The   clinic staff is available to answer your questions during regular business hours (8:30am-5pm).  Please don’t hesitate to call and ask to speak to one of our nurses for clinical concerns.    °A surgeon from [facility] is always on call at the hospitals 24 hours/day °If you have a medical emergency, go to the nearest emergency room or call 911. ° °FOLLOW UP in our office °One the day of your discharge from the hospital (or the next business weekday), please call [facility] to set up or confirm an appointment to see your surgeon in the office for a follow-up appointment.  Usually it is 2-3 weeks after your surgery.   °If you have skin staples at your incision(s), let the office know so we can set up a time in the office for the nurse to remove them (usually around 10 days after surgery). °Make sure that you call for appointments the day of discharge (or the next business weekday) from the hospital to ensure a convenient appointment time. °IF YOU HAVE DISABILITY OR FAMILY LEAVE FORMS, BRING THEM TO THE OFFICE FOR PROCESSING.  DO NOT GIVE THEM TO YOUR DOCTOR. ° °[facility] °[address], [address], [place], [place]  [address] ? °[phone] - Main °[phone] - Toll Free,  [phone] - Fax °[email or web address] ° °GETTING TO GOOD BOWEL HEALTH. °It is  expected for your digestive tract to need a few months to get back to normal.  It is common for your bowel movements and stools to be irregular.  You will have occasional bloating and cramping that should eventually fade away.  Until you are eating solid food normally, off all pain medications, and back to regular activities; your bowels will not be normal.   °Avoiding constipation °The goal: ONE SOFT BOWEL MOVEMENT A DAY!    °Drink plenty of fluids.  Choose water first. °TAKE A FIBER SUPPLEMENT EVERY DAY THE REST OF YOUR LIFE °During your first week back home, gradually add back a fiber supplement every day °Experiment which form you can tolerate.   There are many forms such as powders, tablets, wafers, gummies, etc °Psyllium bran (Metamucil), methylcellulose (Citrucel), Miralax or Glycolax, Benefiber, Flax Seed.  °Adjust the dose week-by-week (1/2 dose/day to 6 doses a day) until you are moving your bowels 1-2 times a day.  Cut back the dose or try a different fiber product if it is giving you problems such as diarrhea or bloating. °Sometimes a laxative is needed to help jump-start bowels if constipated until the fiber supplement can help regulate your bowels.  If you are tolerating eating & you are farting, it is okay to try a gentle laxative such as double dose MiraLax, prune juice, or Milk of Magnesia.  Avoid using laxatives too often. °Stool softeners can sometimes help counteract the constipating effects of narcotic pain medicines.  It can also cause diarrhea, so avoid using for too long. °If you are still constipated despite taking fiber daily, eating solids, and a few doses of laxatives, call our office. °Controlling diarrhea °Try drinking liquids and eating bland foods for a few days to avoid stressing your intestines further. °Avoid dairy products (especially milk & ice cream) for a short time.  The intestines often can lose the ability to digest lactose when stressed. °Avoid foods that cause gassiness or  bloating.  Typical foods include beans and other legumes, cabbage, broccoli, and dairy foods.  Avoid greasy, spicy, fast foods.  Every person has   some sensitivity to other foods, so listen to your body and avoid those foods that trigger problems for you. °Probiotics (such as active yogurt, Align, etc) may help repopulate the intestines and colon with normal bacteria and calm down a sensitive digestive tract °Adding a fiber supplement gradually can help thicken stools by absorbing excess fluid and retrain the intestines to act more normally.  Slowly increase the dose over a few weeks.  Too much fiber too soon can backfire and cause cramping & bloating. °It is okay to try and slow down diarrhea with a few doses of antidiarrheal medicines.   °Bismuth subsalicylate (ex. Kayopectate, Pepto Bismol) for a few doses can help control diarrhea.  Avoid if pregnant.   °Loperamide (Imodium) can slow down diarrhea.  Start with one tablet (2mg) first.  Avoid if you are having fevers or severe pain.  °ILEOSTOMY PATIENTS WILL HAVE CHRONIC DIARRHEA since their colon is not in use.    °Drink plenty of liquids.  You will need to drink even more glasses of water/liquid a day to avoid getting dehydrated. °Record output from your ileostomy.  Expect to empty the bag every 3-4 hours at first.  Most people with a permanent ileostomy empty their bag 4-6 times at the least.   °Use antidiarrheal medicine (especially Imodium) several times a day to avoid getting dehydrated.  Start with a dose at bedtime & breakfast.  Adjust up or down as needed.  Increase antidiarrheal medications as directed to avoid emptying the bag more than 8 times a day (every 3 hours). °Work with your wound ostomy nurse to learn care for your ostomy.  See ostomy care instructions. °TROUBLESHOOTING IRREGULAR BOWELS °1) Start with a soft & bland diet. No spicy, greasy, or fried foods.  °2) Avoid gluten/wheat or dairy products from diet to see if symptoms improve. °3) Miralax  17gm or flax seed mixed in 8oz. water or juice-daily. May use 2-4 times a day as needed. °4) Gas-X, Phazyme, etc. as needed for gas & bloating.  °5) Prilosec (omeprazole) over-the-counter as needed °6)  Consider probiotics (Align, Activa, etc) to help calm the bowels down ° °Call your doctor if you are getting worse or not getting better.  Sometimes further testing (cultures, endoscopy, X-ray studies, CT scans, bloodwork, etc.) may be needed to help diagnose and treat the cause of the diarrhea. °[facility] °[address], [address], [place], [place]  [address] °[phone] - Main.    °[phone]  - Toll Free.   [phone] - Fax °[email or web address] ° ° °

## 2021-01-17 NOTE — Anesthesia Procedure Notes (Signed)
Procedure Name: Intubation Performed by: Rosaland Lao, CRNA Pre-anesthesia Checklist: Patient identified, Emergency Drugs available, Suction available and Patient being monitored Patient Re-evaluated:Patient Re-evaluated prior to induction Oxygen Delivery Method: Circle system utilized Preoxygenation: Pre-oxygenation with 100% oxygen Induction Type: IV induction Ventilation: Mask ventilation without difficulty Laryngoscope Size: Miller and 2 Grade View: Grade I Tube type: Oral Tube size: 7.0 mm Number of attempts: 1 Airway Equipment and Method: Stylet and Oral airway Placement Confirmation: ETT inserted through vocal cords under direct vision,  positive ETCO2 and breath sounds checked- equal and bilateral Secured at: 21 cm Tube secured with: Tape Dental Injury: Teeth and Oropharynx as per pre-operative assessment

## 2021-01-18 ENCOUNTER — Encounter (HOSPITAL_COMMUNITY): Payer: Self-pay | Admitting: Surgery

## 2021-01-18 LAB — SURGICAL PATHOLOGY

## 2021-01-18 LAB — BASIC METABOLIC PANEL
Anion gap: 10 (ref 5–15)
BUN: 14 mg/dL (ref 8–23)
CO2: 20 mmol/L — ABNORMAL LOW (ref 22–32)
Calcium: 8.5 mg/dL — ABNORMAL LOW (ref 8.9–10.3)
Chloride: 108 mmol/L (ref 98–111)
Creatinine, Ser: 0.9 mg/dL (ref 0.44–1.00)
GFR, Estimated: 59 mL/min — ABNORMAL LOW (ref >60)
Glucose, Bld: 145 mg/dL — ABNORMAL HIGH (ref 70–99)
Potassium: 4.1 mmol/L (ref 3.5–5.1)
Sodium: 138 mmol/L (ref 135–145)

## 2021-01-18 LAB — CBC
HCT: 30.2 % — ABNORMAL LOW (ref 36.0–46.0)
Hemoglobin: 9.3 g/dL — ABNORMAL LOW (ref 12.0–15.0)
MCH: 29.4 pg (ref 26.0–34.0)
MCHC: 30.8 g/dL (ref 30.0–36.0)
MCV: 95.6 fL (ref 80.0–100.0)
Platelets: 362 10*3/uL (ref 150–400)
RBC: 3.16 MIL/uL — ABNORMAL LOW (ref 3.87–5.11)
RDW: 13.6 % (ref 11.5–15.5)
WBC: 18.1 10*3/uL — ABNORMAL HIGH (ref 4.0–10.5)
nRBC: 0 % (ref 0.0–0.2)

## 2021-01-18 LAB — MAGNESIUM: Magnesium: 1.7 mg/dL (ref 1.7–2.4)

## 2021-01-18 NOTE — Progress Notes (Signed)
Pharmacy Brief Note - Alvimopan (Entereg)  The standing order set for alvimopan (Entereg) now includes an automatic order to discontinue the drug after the patient has had a bowel movement. The change was approved by the [place] and the Medical Executive Committee.   This patient has had bowel movements documented by nursing. Therefore, alvimopan has been discontinued. If there are questions, please contact the pharmacy at [phone].   Thank you-   Dia Sitter, PharmD, BCPS 01/18/2021 11:32 AM

## 2021-01-18 NOTE — Progress Notes (Signed)
[name] [id number] 12/15/1922  CARE TEAM:  PCP: [name], MD  Outpatient Care Team: Patient Care Team: [name], MD as PCP - General (Family Medicine) Susa Day, MD as Consulting Physician (Orthopedic Surgery) Druscilla Brownie, MD as Consulting Physician (Dermatology) Greer Pickerel, MD as Consulting Physician (General Surgery) Iran Planas, MD as Consulting Physician (Orthopedic Surgery) Michael Boston, MD as Consulting Physician (Colon and Rectal Surgery) Gavin Pound, MD as Consulting Physician (Rheumatology) Deboraha Sprang, MD as Consulting Physician (Cardiology)  Inpatient Treatment Team: Treatment Team: Attending Provider: Michael Boston, MD; Technician: Ernest Mallick, NT; Registered Nurse: Arminda Resides, RN; Utilization Review: Glean Salvo, RN   Problem List:   Principal Problem:   History of diverticulitis of colon with perforation Active Problems:   Polymyalgia rheumatica (HCC)   Hypothyroidism   HTN (hypertension)   Positive ANA (antinuclear antibody)   Chronic atrial fibrillation (HCC)   Current chronic use of systemic steroids   Chronic anticoagulation   Parastomal hernia   1 Day Post-Op  01/17/2021  POST-OPERATIVE DIAGNOSIS:   COLOSTOMY FOR COLON RESECTION. DESIRE FOR OSTOMY TAKEDOWN PARACOLOSTOMY STOMAL HERNIA INCISIONAL HERNIAS  PROCEDURE:  XI ROBOTIC ASSISTED OSTOMY TAKEDOWN ROBOTIC, LYSIS OF ADHESIONS RECTOSIGMOID RESECTION BILATERAL TAP BLOCK RIGID PROCTOSCOPY  SURGEON:  [name], MD  OR FINDINGS:    Moderate omental and small bowel adhesions to anterior abdominal wall and pelvis.  Omentum within Swiss cheese type midline hernias.  Moderate parastomal hernia.  Nothing incarcerated.  Rather fibrotic and narrowed proximal rectum.  Required rectosigmoid resection and ultimately anastomosis to mid rectum.  It is a 29 EEA stapled anastomosis 8-10 cm from anal verge.  Closest edge  posteriorly.  Assessment  Recovering well so far  [facility] Stay = 1 days)  Plan:  -ERAS protocol.  Advance diet.  Mobilize.  Hypertensive control.  Follow-up polymyalgia on her lower steroid dose.  VTE prophylaxis- SCDs, etc  mobilize as tolerated to help recovery  Disposition:  Disposition:  The patient is from: Home  Anticipate discharge to:  Home  Anticipated Date of Discharge is:  March 11,2022    Barriers to discharge:  Pending Clinical improvement (more likely than not)  Patient currently is NOT MEDICALLY STABLE for discharge from the hospital from a surgery standpoint.      25 minutes spent in review, evaluation, examination, counseling, and coordination of care.   I have reviewed this patient's available data, including medical history, events of note, physical examination and test results as part of my evaluation.  A significant portion of that time was spent in counseling.  Care during the described time interval was provided by me.  01/18/2021    Subjective: (Chief complaint)  Denies pain.  Tolerating liquids.  Having flatus and bowel movements.  Has gotten out of bed.  Feels like she is doing quite well so far.  Objective:  Vital signs:  Vitals:   01/17/21 1824 01/17/21 2137 01/18/21 0158 01/18/21 0532  BP: 127/78 130/76 138/78 136/64  Pulse: 80 80 74 69  Resp: 16 16 16 16   Temp: 97.6 F (36.4 C) 97.7 F (36.5 C)    TempSrc: Oral Oral    SpO2: 100% 99% 97% 98%  Weight:      Height:           Intake/Output   Yesterday:  03/09 0701 - 03/10 0700 In: 3990.2 [P.O.:1440; I.V.:2299.2; IV Piggyback:251.1] Out: 995 [Urine:665; Drains:230; Blood:100] This shift:  Total I/O In: 2167.7 [P.O.:1080;  I.V.:1086.7; IV Piggyback:1.1] Out: 380 [Urine:350; Drains:30]  Bowel function:  Flatus: YES  BM:  YES  Drain: Serosanguinous   Physical Exam:  General: Pt awake/alert in no acute distress Eyes: PERRL, normal EOM.  Sclera  clear.  No icterus Neuro: CN II-XII intact w/o focal sensory/motor deficits. Lymph: No head/neck/groin lymphadenopathy Psych:  No delerium/psychosis/paranoia.  Oriented x 4 HENT: Normocephalic, Mucus membranes moist.  No thrush Neck: Supple, No tracheal deviation.  No obvious thyromegaly Chest: No pain to chest wall compression.  Good respiratory excursion.  No audible wheezing CV:  Pulses intact.  Regular rhythm.  No major extremity edema MS: Normal AROM mjr joints.  No obvious deformity  Abdomen: Soft.  Nondistended.  Nontender.  No evidence of peritonitis.  No incarcerated hernias.  Ext:   No deformity.  No mjr edema.  No cyanosis Skin: No petechiae / purpurea.  No major sores.  Warm and dry    Results:   Cultures: Recent Results (from the past 720 hour(s))  SARS CORONAVIRUS 2 (TAT 6-24 HRS) Nasopharyngeal Nasopharyngeal Swab     Status: None   Collection Time: 01/15/21 12:42 PM   Specimen: Nasopharyngeal Swab  Result Value Ref Range Status   SARS Coronavirus 2 NEGATIVE NEGATIVE Final    Comment: (NOTE) SARS-CoV-2 target nucleic acids are NOT DETECTED.  The SARS-CoV-2 RNA is generally detectable in upper and lower respiratory specimens during the acute phase of infection. Negative results do not preclude SARS-CoV-2 infection, do not rule out co-infections with other pathogens, and should not be used as the sole basis for treatment or other patient management decisions. Negative results must be combined with clinical observations, patient history, and epidemiological information. The expected result is Negative.  Fact Sheet for Patients: SugarRoll.be  Fact Sheet for Healthcare Providers: https://www.woods-mathews.com/  This test is not yet approved or cleared by the [place] FDA and  has been authorized for detection and/or diagnosis of SARS-CoV-2 by FDA under an Emergency Use Authorization (EUA). This EUA will remain  in  effect (meaning this test can be used) for the duration of the COVID-19 declaration under Se ction 564(b)(1) of the Act, 21 U.S.C. section 360bbb-3(b)(1), unless the authorization is terminated or revoked sooner.  Performed at [facility], [place] [address], [place], [place] [address]     Labs: Results for orders placed or performed during the hospital encounter of 01/17/21 (from the past 48 hour(s))  Basic metabolic panel     Status: Abnormal   Collection Time: 01/18/21  5:07 AM  Result Value Ref Range   Sodium 138 135 - 145 mmol/L   Potassium 4.1 3.5 - 5.1 mmol/L   Chloride 108 98 - 111 mmol/L   CO2 20 (L) 22 - 32 mmol/L   Glucose, Bld 145 (H) 70 - 99 mg/dL    Comment: Glucose reference range applies only to samples taken after fasting for at least 8 hours.   BUN 14 8 - 23 mg/dL   Creatinine, Ser 0.90 0.44 - 1.00 mg/dL   Calcium 8.5 (L) 8.9 - 10.3 mg/dL   GFR, Estimated 59 (L) >60 mL/min    Comment: (NOTE) Calculated using the CKD-EPI Creatinine Equation (2021)    Anion gap 10 5 - 15    Comment: Performed at [facility], [place] [address], [place], [place] [address]  CBC     Status: Abnormal   Collection Time: 01/18/21  5:07 AM  Result Value Ref Range   WBC 18.1 (H) 4.0 -  10.5 K/uL   RBC 3.16 (L) 3.87 - 5.11 MIL/uL   Hemoglobin 9.3 (L) 12.0 - 15.0 g/dL   HCT 30.2 (L) 36.0 - 46.0 %   MCV 95.6 80.0 - 100.0 fL   MCH 29.4 26.0 - 34.0 pg   MCHC 30.8 30.0 - 36.0 g/dL   RDW 13.6 11.5 - 15.5 %   Platelets 362 150 - 400 K/uL   nRBC 0.0 0.0 - 0.2 %    Comment: Performed at [facility], [place] [address] [place], [place] [address]  Magnesium     Status: None   Collection Time: 01/18/21  5:07 AM  Result Value Ref Range   Magnesium 1.7 1.7 - 2.4 mg/dL    Comment: Performed at [facility], [place] [address] [place], [place] [address]    Imaging / Studies: No results found.  Medications / Allergies: per  chart  Antibiotics: Anti-infectives (From admission, onward)   Start     Dose/Rate Route Frequency Ordered Stop   01/17/21 2200  cefoTEtan (CEFOTAN) 2 g in sodium chloride 0.9 % 100 mL IVPB        2 g 200 mL/hr over 30 Minutes Intravenous Every 12 hours 01/17/21 1541 01/17/21 2130   01/17/21 1400  neomycin (MYCIFRADIN) tablet 1,000 mg  Status:  Discontinued       "And" Linked Group Details   1,000 mg Oral 3 times per day 01/17/21 0652 01/17/21 0656   01/17/21 1400  metroNIDAZOLE (FLAGYL) tablet 1,000 mg  Status:  Discontinued       "And" Linked Group Details   1,000 mg Oral 3 times per day 01/17/21 0652 01/17/21 0656   01/17/21 0700  clindamycin (CLEOCIN) 900 mg, gentamicin (GARAMYCIN) 240 mg in sodium chloride 0.9 % 1,000 mL for intraperitoneal lavage  Status:  Discontinued         Irrigation To Surgery 01/17/21 0652 01/17/21 1256   01/17/21 0700  cefoTEtan (CEFOTAN) 2 g in sodium chloride 0.9 % 100 mL IVPB        2 g 200 mL/hr over 30 Minutes Intravenous On call to O.R. 01/17/21 4680 01/17/21 0833   11/17/20 0800  clindamycin (CLEOCIN) 900 mg, gentamicin (GARAMYCIN) 240 mg in sodium chloride 0.9 % 1,000 mL for intraperitoneal lavage         Irrigation To Surgery 11/16/20 0747 11/18/20 0800        Note: Portions of this report may have been transcribed using voice recognition software. Every effort was made to ensure accuracy; however, inadvertent computerized transcription errors may be present.   Any transcriptional errors that result from this process are unintentional.    [name], MD, FACS, MASCRS  Gastrointestinal and Minimally Invasive Surgery  [facility] [address], [place], [place] [address] [phone] Fax [phone] Main/Paging  CONTACT INFORMATION: Weekday (9AM-5PM) concerns: Call [facility] main office at [phone] Weeknight (5PM-9AM) or Weekend/Holiday concerns: Check [email or web address] for General Surgery [facility]  coverage (Please, do not use SecureChat as it is not reliable communication to operating surgeons for immediate patient care)      01/18/2021  6:41 AM

## 2021-01-19 MED ORDER — TIZANIDINE HCL 4 MG PO TABS
2.0000 mg | ORAL_TABLET | Freq: Three times a day (TID) | ORAL | Status: DC
  Administered 2021-01-19 – 2021-01-21 (×7): 2 mg via ORAL
  Filled 2021-01-19 (×7): qty 1

## 2021-01-19 MED ORDER — DILTIAZEM HCL ER COATED BEADS 120 MG PO CP24
120.0000 mg | ORAL_CAPSULE | Freq: Every day | ORAL | Status: DC
  Administered 2021-01-19 – 2021-01-20 (×2): 120 mg via ORAL
  Filled 2021-01-19 (×5): qty 1

## 2021-01-19 MED ORDER — POLYETHYLENE GLYCOL 3350 17 G PO PACK
17.0000 g | PACK | Freq: Every day | ORAL | Status: DC
  Administered 2021-01-19: 17 g via ORAL
  Filled 2021-01-19: qty 1

## 2021-01-19 MED ORDER — APIXABAN 5 MG PO TABS
5.0000 mg | ORAL_TABLET | Freq: Two times a day (BID) | ORAL | Status: DC
  Administered 2021-01-19 – 2021-01-21 (×5): 5 mg via ORAL
  Filled 2021-01-19 (×5): qty 1

## 2021-01-19 MED ORDER — LOSARTAN POTASSIUM 50 MG PO TABS
50.0000 mg | ORAL_TABLET | Freq: Every day | ORAL | Status: DC
  Administered 2021-01-19: 50 mg via ORAL
  Filled 2021-01-19: qty 1

## 2021-01-19 NOTE — Care Management Important Message (Signed)
Important Message  Patient Details IM Letter placed in Patient's room. Name: [name] MRN: [id number] Date of Birth: 09/14/1923   Medicare Important Message Given:  Yes     Kerin Salen 01/19/2021, 11:29 AM

## 2021-01-19 NOTE — Progress Notes (Signed)
[name] February 10, 1923  CARE TEAM:  PCP: [name], MD  Outpatient Care Team: Patient Care Team: [name], MD as PCP - General (Family Medicine) Susa Day, MD as Consulting Physician (Orthopedic Surgery) Druscilla Brownie, MD as Consulting Physician (Dermatology) Greer Pickerel, MD as Consulting Physician (General Surgery) Iran Planas, MD as Consulting Physician (Orthopedic Surgery) Michael Boston, MD as Consulting Physician (Colon and Rectal Surgery) Gavin Pound, MD as Consulting Physician (Rheumatology) Deboraha Sprang, MD as Consulting Physician (Cardiology)  Inpatient Treatment Team: Treatment Team: Attending Provider: Michael Boston, MD; Technician: Ernest Mallick, NT; Technician: Abbe Amsterdam, NT; Registered Nurse: Kai Levins, RN; Utilization Review: Glean Salvo, RN; Registered Nurse: Dorinda Hill, RN   Problem List:   Principal Problem:   History of diverticulitis of colon with perforation Active Problems:   Polymyalgia rheumatica (HCC)   Hypothyroidism   HTN (hypertension)   Positive ANA (antinuclear antibody)   Chronic atrial fibrillation (HCC)   Current chronic use of systemic steroids   Chronic anticoagulation   Parastomal hernia   2 Days Post-Op  01/17/2021  POST-OPERATIVE DIAGNOSIS:   COLOSTOMY FOR COLON RESECTION. DESIRE FOR OSTOMY TAKEDOWN PARACOLOSTOMY STOMAL HERNIA INCISIONAL HERNIAS  PROCEDURE:  XI ROBOTIC ASSISTED OSTOMY TAKEDOWN ROBOTIC, LYSIS OF ADHESIONS RECTOSIGMOID RESECTION BILATERAL TAP BLOCK RIGID PROCTOSCOPY  SURGEON:  [name], MD  OR FINDINGS:    Moderate omental and small bowel adhesions to anterior abdominal wall and pelvis.  Omentum within Swiss cheese type midline hernias.  Moderate parastomal hernia.  Nothing incarcerated.  Rather fibrotic and narrowed proximal rectum.  Required rectosigmoid resection and ultimately anastomosis to mid rectum.  It is a 29 EEA  stapled anastomosis 8-10 cm from anal verge.  Closest edge posteriorly.  Assessment  Recovering well so far  [facility] Stay = 2 days)  Plan:  ERAS protocol.  Advance diet - solid  Mobilize.  Hypertensive control.  Restart cozaar & cardizem  Follow-up polymyalgia on her lower steroid dose.  Make ziniflex scheduled w pain.  Cont tylenol RTC.  PRN meds as well  VTE prophylaxis- SCDs, etc  mobilize as tolerated to help recovery  Disposition:  Disposition:  The patient is from: Home  Anticipate discharge to:  Home  Anticipated Date of Discharge is:  March 12,2022    Barriers to discharge:  Pending Clinical improvement (more likely than not)  Patient currently is NOT MEDICALLY STABLE for discharge from the hospital from a surgery standpoint.      25 minutes spent in review, evaluation, examination, counseling, and coordination of care.   I have reviewed this patient's available data, including medical history, events of note, physical examination and test results as part of my evaluation.  A significant portion of that time was spent in counseling.  Care during the described time interval was provided by me.  01/19/2021    Subjective: (Chief complaint)  Denies pain.  Tolerating liquids.  Having flatus and bowel movements.  Has gotten out of bed but no walking  More sore today - did not know she could take more than tylenol despite orders & prio reeducation - re-educated  Objective:  Vital signs:  Vitals:   01/18/21 0900 01/18/21 1335 01/18/21 2007 01/19/21 0521  BP: (!) 148/64 139/73 (!) 163/75 (!) 144/60  Pulse: 64 62 68 68  Resp:   16 16  Temp: 97.8 F (36.6 C) 98 F (36.7 C) 98.6 F (37 C) 98.6 F (37 C)  TempSrc: Oral Oral  Oral Oral  SpO2: 99% 100% 98% 94%  Weight:      Height:        Last BM Date: 01/18/21  Intake/Output   Yesterday:  03/10 0701 - 03/11 0700 In: 0  Out: 5277 [Urine:1590; Drains:45] This shift:  No intake/output  data recorded.  Bowel function:  Flatus: YES  BM:  YES  Drain: Serosanguinous   Physical Exam:  General: Pt awake/alert in no acute distress Eyes: PERRL, normal EOM.  Sclera clear.  No icterus Neuro: CN II-XII intact w/o focal sensory/motor deficits. Lymph: No head/neck/groin lymphadenopathy Psych:  No delerium/psychosis/paranoia.  Oriented x 4 HENT: Normocephalic, Mucus membranes moist.  No thrush Neck: Supple, No tracheal deviation.  No obvious thyromegaly Chest: No pain to chest wall compression.  Good respiratory excursion.  No audible wheezing CV:  Pulses intact.  Regular rhythm.  No major extremity edema MS: Normal AROM mjr joints.  No obvious deformity  Abdomen: Soft.  Nondistended.  Nontender.  No evidence of peritonitis.  No incarcerated hernias.  Ext:   No deformity.  No mjr edema.  No cyanosis Skin: No petechiae / purpurea.  No major sores.  Warm and dry    Results:   Cultures: Recent Results (from the past 720 hour(s))  SARS CORONAVIRUS 2 (TAT 6-24 HRS) Nasopharyngeal Nasopharyngeal Swab     Status: None   Collection Time: 01/15/21 12:42 PM   Specimen: Nasopharyngeal Swab  Result Value Ref Range Status   SARS Coronavirus 2 NEGATIVE NEGATIVE Final    Comment: (NOTE) SARS-CoV-2 target nucleic acids are NOT DETECTED.  The SARS-CoV-2 RNA is generally detectable in upper and lower respiratory specimens during the acute phase of infection. Negative results do not preclude SARS-CoV-2 infection, do not rule out co-infections with other pathogens, and should not be used as the sole basis for treatment or other patient management decisions. Negative results must be combined with clinical observations, patient history, and epidemiological information. The expected result is Negative.  Fact Sheet for Patients: SugarRoll.be  Fact Sheet for Healthcare Providers: https://www.woods-mathews.com/  This test is not yet approved  or cleared by the [place] FDA and  has been authorized for detection and/or diagnosis of SARS-CoV-2 by FDA under an Emergency Use Authorization (EUA). This EUA will remain  in effect (meaning this test can be used) for the duration of the COVID-19 declaration under Se ction 564(b)(1) of the Act, 21 U.S.C. section 360bbb-3(b)(1), unless the authorization is terminated or revoked sooner.  Performed at [facility], [place] [address], [place], [place] [address]     Labs: Results for orders placed or performed during the hospital encounter of 01/17/21 (from the past 48 hour(s))  Surgical pathology     Status: None   Collection Time: 01/17/21 10:45 AM  Result Value Ref Range   SURGICAL PATHOLOGY      SURGICAL PATHOLOGY CASE: [id number] PATIENT: [name] Surgical Pathology Report     Clinical History: Colostomy for colon resection, desire for ostomy takedown (crm)   FINAL MICROSCOPIC DIAGNOSIS:  A.  COLOSTOMY, TAKEDOWN: -  Colo-cutaneous junction consistent with ostomy site  B. COLON, RECTOSIGMOID, RESECTION: -  Portion of benign colon with diverticular disease -  Viable margins  C. FINAL DISTAL RING AND POSTERIOR RECTAL WALL: -  Benign colon with mild ischemic and procedure-related changes   GROSS DESCRIPTION:  Specimen A: Received fresh is a 6.3 cm in length segment of intestine, with one end having a rim of pink-white wrinkled skin, with  adjacent hyperemic granular mucosa, consistent with clinically stated colostomy. The opposite end is open.  External surfaces have scattered adhesions and adherent fatty tissue.  On opening, there is tan-pink smooth, soft mucosa with normal intestinal folds.  Representative sections which include colostomy  end and open end are submitted in 1 block.  Specimen B: Received fresh is a 7.9 cm in length portion of intestine, clinically rectosigmoid.  One end is closed with staples, and the opposite end has a blind loop  with embedded suture material and metallic staples within the wall.  External surfaces have scattered adhesions and adherent fatty tissue.  On opening, the specimen contains a small amount of light yellow soft material.  The mucosa is tan-pink to hyperemic, smooth, soft with widely spaced intestinal folds.  The wall is up to 0.6 cm thick, with few nonperforated diverticula.  There are no mass lesions.  No lymphoid tissue is identified within the attached fat.  Also within the container is a 2.4 x 1.7 x 1.2 cm portion of disrupted tissue with 1 aspect having pink-red smooth, soft mucosa, and on sectioning has underlying embedded metallic staples.  Block summary: Block 1 = stapled margin, rectosigmoid Block 2 = sections from blind loop end of rectosigmoid Block  3 = diverticulum Block 4 = representative sections of separate disrupted tissue with mucosa  Specimen C: Received fresh is a 2.8 x 1.6 x 0.7 cm disrupted portion of tissue which has one aspect with pink-red smooth, soft mucosa and underlying wall has embedded suture-like material.  Representative sections are submitted 1 block.  SW 01/17/2021   Final Diagnosis performed by Thressa Sheller, MD.   Electronically signed 01/18/2021 Technical component performed at [place] [address] [place], [place] [address].  Professional component performed at [employer]. [facility], [place] [address], [place], [place] [address].  Immunohistochemistry Technical component (if applicable) was performed at [facility]. [address] [place], [place], [place] [address].   IMMUNOHISTOCHEMISTRY DISCLAIMER (if applicable): Some of these immunohistochemical stains may have been developed and the performance characteristics determine by [facility]. Some may not have been cleared or approved by the U.S. Food and Drug Administration. The FDA has determined that such clearance or  approval is not necessary. This test is used for clinical purposes. It should not be regarded as investigational or for research. This laboratory is certified under the [place] (CLIA-88) as qualified to perform high complexity clinical laboratory testing.  The controls stained appropriately.   Basic metabolic panel     Status: Abnormal   Collection Time: 01/18/21  5:07 AM  Result Value Ref Range   Sodium 138 135 - 145 mmol/L   Potassium 4.1 3.5 - 5.1 mmol/L   Chloride 108 98 - 111 mmol/L   CO2 20 (L) 22 - 32 mmol/L   Glucose, Bld 145 (H) 70 - 99 mg/dL    Comment: Glucose reference range applies only to samples taken after fasting for at least 8 hours.   BUN 14 8 - 23 mg/dL   Creatinine, Ser 0.90 0.44 - 1.00 mg/dL   Calcium 8.5 (L) 8.9 - 10.3 mg/dL   GFR, Estimated 59 (L) >60 mL/min    Comment: (NOTE) Calculated using the CKD-EPI Creatinine Equation (2021)    Anion gap 10 5 - 15    Comment: Performed at [facility], [place] [address], [place], [place] [address]  CBC  Status: Abnormal   Collection Time: 01/18/21  5:07 AM  Result Value Ref Range   WBC 18.1 (H) 4.0 - 10.5 K/uL   RBC 3.16 (L) 3.87 - 5.11 MIL/uL   Hemoglobin 9.3 (L) 12.0 - 15.0 g/dL   HCT 30.2 (L) 36.0 - 46.0 %   MCV 95.6 80.0 - 100.0 fL   MCH 29.4 26.0 - 34.0 pg   MCHC 30.8 30.0 - 36.0 g/dL   RDW 13.6 11.5 - 15.5 %   Platelets 362 150 - 400 K/uL   nRBC 0.0 0.0 - 0.2 %    Comment: Performed at [facility], [place] [address] [place], [place] [address]  Magnesium     Status: None   Collection Time: 01/18/21  5:07 AM  Result Value Ref Range   Magnesium 1.7 1.7 - 2.4 mg/dL    Comment: Performed at [facility], [place] [address] [place], [place] [address]    Imaging / Studies: No results found.  Medications / Allergies: per chart  Antibiotics: Anti-infectives (From admission, onward)   Start     Dose/Rate Route  Frequency Ordered Stop   01/17/21 2200  cefoTEtan (CEFOTAN) 2 g in sodium chloride 0.9 % 100 mL IVPB        2 g 200 mL/hr over 30 Minutes Intravenous Every 12 hours 01/17/21 1541 01/17/21 2130   01/17/21 1400  neomycin (MYCIFRADIN) tablet 1,000 mg  Status:  Discontinued       "And" Linked Group Details   1,000 mg Oral 3 times per day 01/17/21 0652 01/17/21 0656   01/17/21 1400  metroNIDAZOLE (FLAGYL) tablet 1,000 mg  Status:  Discontinued       "And" Linked Group Details   1,000 mg Oral 3 times per day 01/17/21 0652 01/17/21 0656   01/17/21 0700  clindamycin (CLEOCIN) 900 mg, gentamicin (GARAMYCIN) 240 mg in sodium chloride 0.9 % 1,000 mL for intraperitoneal lavage  Status:  Discontinued         Irrigation To Surgery 01/17/21 0652 01/17/21 1256   01/17/21 0700  cefoTEtan (CEFOTAN) 2 g in sodium chloride 0.9 % 100 mL IVPB        2 g 200 mL/hr over 30 Minutes Intravenous On call to O.R. 01/17/21 0981 01/17/21 0833   11/17/20 0800  clindamycin (CLEOCIN) 900 mg, gentamicin (GARAMYCIN) 240 mg in sodium chloride 0.9 % 1,000 mL for intraperitoneal lavage         Irrigation To Surgery 11/16/20 0747 11/18/20 0800        Note: Portions of this report may have been transcribed using voice recognition software. Every effort was made to ensure accuracy; however, inadvertent computerized transcription errors may be present.   Any transcriptional errors that result from this process are unintentional.    [name], MD, FACS, MASCRS  Gastrointestinal and Minimally Invasive Surgery  [facility] [address] [place], [place] [address] [phone] Fax [phone] Main/Paging  CONTACT INFORMATION: Weekday (9AM-5PM) concerns: Call [facility] main office at [phone] Weeknight (5PM-9AM) or Weekend/Holiday concerns: Check [email or web address] for General Surgery [facility] coverage (Please, do not use SecureChat as it is not reliable communication to operating surgeons for  immediate patient care)      01/19/2021  7:46 AM

## 2021-01-19 NOTE — Evaluation (Signed)
Physical Therapy Evaluation Patient Details Name: [name] MRN: [id number] DOB: 1922-11-21 Today's Date: 01/19/2021   History of Present Illness  Pt s/p robotic assisted colostomy takedown 01/17/21.  Pt with hx of PVD, back surgery, colostomy 2* perforated diverticulitis 01/06/20 and L hip fx with L THR by posterior approach 12/02/20  Clinical Impression  Pt admitted as above and presenting with functional mobility limitations 2* decreased L LE strength, post op pain, and ambulatory balance deficits.  Pt should progress well to dc home with family assist and would benefit from continued HHPT  - has been on case load with [facility] since recent hip fx.    Follow Up Recommendations Home health PT    Equipment Recommendations  None recommended by PT    Recommendations for Other Services       Precautions / Restrictions Precautions Precautions: Fall;Posterior Hip Precaution Comments: Pt able to recall posterior THP Restrictions Weight Bearing Restrictions: No      Mobility  Bed Mobility               General bed mobility comments: Pt up on BSC on arrival and requests to chair at session end    Transfers Overall transfer level: Needs assistance   Transfers: Sit to/from Stand Sit to Stand: Min guard         General transfer comment: min cues for use of UEs  Ambulation/Gait Ambulation/Gait assistance: Min guard Gait Distance (Feet): 250 Feet Assistive device: Rolling walker (2 wheeled) Gait Pattern/deviations: Step-to pattern;Step-through pattern;Decreased stance time - left;Shuffle;Trunk flexed Gait velocity: decr   General Gait Details: cues for posture and position from RW for safety  Stairs            Wheelchair Mobility    Modified Rankin (Stroke Patients Only)       Balance Overall balance assessment: Needs assistance Sitting-balance support: No upper extremity supported;Feet supported Sitting balance-Leahy Scale: Good     Standing balance  support: No upper extremity supported Standing balance-Leahy Scale: Fair                               Pertinent Vitals/Pain Pain Assessment: Faces Faces Pain Scale: Hurts a little bit Pain Location: abdomen Pain Descriptors / Indicators: Sore Pain Intervention(s): Limited activity within patient's tolerance;Monitored during session    Home Living Family/patient expects to be discharged to:: Private residence Living Arrangements: Other (Comment) Available Help at Discharge: Family;Personal care attendant;Available PRN/intermittently Type of Home: House Home Access: Stairs to enter Entrance Stairs-Rails: Psychiatric nurse of Steps: 2 Home Layout: One level Home Equipment: Walker - 2 wheels;Grab bars - tub/shower;Bedside commode;Cane - single point Additional Comments: Son and daughter live next door and take runs stayring at night.  Pt has PCA 4x/wk from 9a-3p    Prior Function Level of Independence: Needs assistance   Gait / Transfers Assistance Needed: Mod indep ambulating with RW           Hand Dominance   Dominant Hand: Right    Extremity/Trunk Assessment   Upper Extremity Assessment Upper Extremity Assessment: Overall WFL for tasks assessed    Lower Extremity Assessment Lower Extremity Assessment: Overall WFL for tasks assessed       Communication   Communication: No difficulties  Cognition Arousal/Alertness: Awake/alert Behavior During Therapy: WFL for tasks assessed/performed Overall Cognitive Status: Within Functional Limits for tasks assessed  General Comments      Exercises     Assessment/Plan    PT Assessment Patient needs continued PT services  PT Problem List Decreased activity tolerance;Decreased balance;Decreased mobility;Decreased knowledge of use of DME;Pain       PT Treatment Interventions DME instruction;Gait training;Stair training;Functional  mobility training;Therapeutic activities;Therapeutic exercise;Patient/family education;Balance training    PT Goals (Current goals can be found in the Care Plan section)  Acute Rehab PT Goals Patient Stated Goal: Regain IND PT Goal Formulation: With patient Time For Goal Achievement: 02/02/21 Potential to Achieve Goals: Good    Frequency Min 3X/week   Barriers to discharge        Co-evaluation               AM-PAC PT "6 Clicks" Mobility  Outcome Measure Help needed turning from your back to your side while in a flat bed without using bedrails?: A Little Help needed moving from lying on your back to sitting on the side of a flat bed without using bedrails?: A Little Help needed moving to and from a bed to a chair (including a wheelchair)?: A Little Help needed standing up from a chair using your arms (e.g., wheelchair or bedside chair)?: A Little Help needed to walk in hospital room?: A Little Help needed climbing 3-5 steps with a railing? : A Little 6 Click Score: 18    End of Session Equipment Utilized During Treatment: Gait belt Activity Tolerance: Patient tolerated treatment well Patient left: in chair;with call bell/phone within reach;with chair alarm set;with family/visitor present Nurse Communication: Mobility status PT Visit Diagnosis: Difficulty in walking, not elsewhere classified (R26.2)    Time: 1287-8676 PT Time Calculation (min) (ACUTE ONLY): 22 min   Charges:   PT Evaluation $PT Eval Low Complexity: 1 Low          [place] Pager [phone] Office [phone]   Somaly Marteney 01/19/2021, 3:08 PM

## 2021-01-19 NOTE — TOC Initial Note (Signed)
Transition of Care [facility]) - Initial/Assessment Note    Patient Details  Name: [name] MRN: [id number] Date of Birth: 1923-08-23  Transition of [facility]) CM/SW Contact:    Lia Hopping, [place] Phone Number: 01/19/2021, 3:13 PM  Clinical Narrative: Re: [place] met with the patient and her adult children at bedside to discuss home health needs. Patient is currently active with [facility] for physical therapy services, according to the patient and family she has one session left. Patient is agreeable to resume PT services. CSW reached out to the hospital liaison Ronalee Belts to confirm. Patient is also active with [facility] for [place] for six hours during the day, while her family stays with her during the evening. The agency assist with bathing, dressing, toileting and preparing meals. Son reports they just renewed the patient care plan and services will start this Sunday. Per Son request, CSW reached out to [facility] staff Lattie Haw  to determine if any additional information was needed. She confirm that she spoke with the patient 's nurse at the hospital  and all question about the patient care were answered.  CSW confirm the patient has DME.  No other needs identified.   Physician please put in orders for Home Health PT.   Expected Discharge Plan: [place] Barriers to Discharge: Continued Medical Work up   Patient Goals and CMS Choice Patient states their goals for this hospitalization and ongoing recovery are:: to return home      Expected Discharge Plan and Services Expected Discharge Plan: [place] In-house Referral: Clinical Social Work   Post Acute Care Choice: [place] arrangements for the past 2 months: [place]: PT [place]: [facility] [employer]) Date [place]: 01/19/21 Time [place]:  1513 Representative spoke with at [place]: [place] Arrangements/Services Living arrangements for the past 2 months: [place] with:: Self Patient language and need for interpreter reviewed:: No Do you feel safe going back to the place where you live?: Yes      Need for Family Participation in Patient Care: Yes (Comment) Care giver support system in place?: Yes (comment) Current home services: Home PT,Sitter Criminal Activity/Legal Involvement Pertinent to Current Situation/Hospitalization: No - Comment as needed  Activities of Daily Living Home Assistive Devices/Equipment: Walker (specify type),Wheelchair,Eyeglasses ADL Screening (condition at time of admission) Patient's cognitive ability adequate to safely complete daily activities?: Yes Is the patient deaf or have difficulty hearing?: No Does the patient have difficulty seeing, even when wearing glasses/contacts?: No Does the patient have difficulty concentrating, remembering, or making decisions?: No Patient able to express need for assistance with ADLs?: Yes Does the patient have difficulty dressing or bathing?: No Independently performs ADLs?: Yes (appropriate for developmental age) Does the patient have difficulty walking or climbing stairs?: No Weakness of Legs: None Weakness of Arms/Hands: None  Permission Sought/Granted Permission sought to share information with : Case Manager Permission granted to share information with : Yes, Verbal Permission Granted     Permission granted to share info w AGENCY: [facility] now [place]  Appearance:: Appears stated age Attitude/Demeanor/Rapport: Engaged Affect (typically observed): Accepting Orientation: : Oriented to Self,Oriented to Place,Oriented to  Time,Oriented to Situation Alcohol / Substance Use: Not Applicable Psych Involvement: No (comment)  Admission diagnosis:  Parastomal hernia [K43.5] Patient Active  Problem List   Diagnosis Date Noted  . Chronic anticoagulation 01/17/2021  . Parastomal hernia s/p colostomy takedown & repair 01/17/2021 01/17/2021  . Polymyalgia rheumatica ([place]) 01/11/2021  . (HFpEF) heart failure with preserved ejection fraction ([place]) 10/27/2020  . Injury of extensor or abductor muscle, fascia, or tendon of thumb at forearm level 08/03/2020  . Orthostatic hypotension 05/24/2020  . Acute blood loss anemia 05/19/2020  . Traumatic hematoma of left hip 05/19/2020  . Current chronic use of systemic steroids 05/19/2020  . Advanced age [age]2019  . Secondary hypercoagulable state ([place]) 03/08/2020  . Hypokalemia 02/15/2020  . History of diverticulitis of colon with perforation 01/26/2020  . Anemia   . Debility 01/14/2020  . Physical debility 01/14/2020  . Chronic atrial fibrillation ([place])   . Recurrent falls 12/29/2019  . Positive ANA (antinuclear antibody) 12/29/2019  . Osteoarthritis 11/13/2017  . Hypertriglyceridemia 01/29/2017  . Excessive gas 01/09/2017  . Physical exam 07/31/2016  . Elevated lipase 03/06/2016  . Chest pain 03/06/2016  . Osteopenia 02/08/2016  . Hypothyroidism 01/26/2016  . HTN (hypertension) 01/26/2016  . Left leg numbness 10/06/2012   PCP:  [name], MD Pharmacy:   [id number] [place], [place] - [address] [place] [id number]: [phone]: [phone]    Social Determinants of Health (SDOH) Interventions    Readmission Risk Interventions No flowsheet data found.

## 2021-01-20 LAB — CBC
HCT: 29.4 % — ABNORMAL LOW (ref 36.0–46.0)
Hemoglobin: 9.1 g/dL — ABNORMAL LOW (ref 12.0–15.0)
MCH: 29.1 pg (ref 26.0–34.0)
MCHC: 31 g/dL (ref 30.0–36.0)
MCV: 93.9 fL (ref 80.0–100.0)
Platelets: 385 10*3/uL (ref 150–400)
RBC: 3.13 MIL/uL — ABNORMAL LOW (ref 3.87–5.11)
RDW: 13.9 % (ref 11.5–15.5)
WBC: 11.3 10*3/uL — ABNORMAL HIGH (ref 4.0–10.5)
nRBC: 0 % (ref 0.0–0.2)

## 2021-01-20 LAB — POTASSIUM: Potassium: 3.6 mmol/L (ref 3.5–5.1)

## 2021-01-20 LAB — MAGNESIUM: Magnesium: 1.8 mg/dL (ref 1.7–2.4)

## 2021-01-20 LAB — PHOSPHORUS: Phosphorus: 2.3 mg/dL — ABNORMAL LOW (ref 2.5–4.6)

## 2021-01-20 MED ORDER — LOSARTAN POTASSIUM 50 MG PO TABS: 100.0000 mg | ORAL_TABLET | Freq: Every day | ORAL | Status: DC

## 2021-01-20 MED ORDER — CALCIUM POLYCARBOPHIL 625 MG PO TABS
625.0000 mg | ORAL_TABLET | Freq: Two times a day (BID) | ORAL | Status: DC
  Administered 2021-01-20 – 2021-01-21 (×3): 625 mg via ORAL
  Filled 2021-01-20 (×4): qty 1

## 2021-01-20 MED ORDER — LOSARTAN POTASSIUM 50 MG PO TABS
100.0000 mg | ORAL_TABLET | Freq: Every day | ORAL | Status: DC
  Administered 2021-01-20 – 2021-01-21 (×2): 100 mg via ORAL
  Filled 2021-01-20 (×2): qty 2

## 2021-01-20 MED ORDER — LOPERAMIDE HCL 2 MG PO CAPS
2.0000 mg | ORAL_CAPSULE | Freq: Once | ORAL | Status: AC
  Administered 2021-01-20: 12:00:00 2 mg via ORAL
  Filled 2021-01-20: qty 1

## 2021-01-20 NOTE — Progress Notes (Addendum)
[name] [id number] 05/26/1923  CARE TEAM:  PCP: [name], MD  Outpatient Care Team: Patient Care Team: [name], MD as PCP - General (Family Medicine) Susa Day, MD as Consulting Physician (Orthopedic Surgery) Druscilla Brownie, MD as Consulting Physician (Dermatology) Greer Pickerel, MD as Consulting Physician (General Surgery) Iran Planas, MD as Consulting Physician (Orthopedic Surgery) Michael Boston, MD as Consulting Physician (Colon and Rectal Surgery) Gavin Pound, MD as Consulting Physician (Rheumatology) Deboraha Sprang, MD as Consulting Physician (Cardiology)  Inpatient Treatment Team: Treatment Team: Attending Provider: Michael Boston, MD; Technician: Ernest Mallick, NT; Utilization Review: Alease Medina, RN; Occupational Therapist: Lenward Chancellor, OT; Registered Nurse: Dorinda Hill, RN   Problem List:   Principal Problem:   Parastomal hernia s/p colostomy takedown & repair 01/17/2021 Active Problems:   Polymyalgia rheumatica (HCC)   Hypothyroidism   HTN (hypertension)   Positive ANA (antinuclear antibody)   Chronic atrial fibrillation (HCC)   History of diverticulitis of colon with perforation   Current chronic use of systemic steroids   Chronic anticoagulation   3 Days Post-Op  01/17/2021  POST-OPERATIVE DIAGNOSIS:   COLOSTOMY FOR COLON RESECTION. DESIRE FOR OSTOMY TAKEDOWN PARACOLOSTOMY STOMAL HERNIA INCISIONAL HERNIAS  PROCEDURE:  XI ROBOTIC ASSISTED OSTOMY TAKEDOWN ROBOTIC, LYSIS OF ADHESIONS RECTOSIGMOID RESECTION BILATERAL TAP BLOCK RIGID PROCTOSCOPY  SURGEON:  [name], MD  OR FINDINGS:    Moderate omental and small bowel adhesions to anterior abdominal wall and pelvis.  Omentum within Swiss cheese type midline hernias.  Moderate parastomal hernia.  Nothing incarcerated.  Rather fibrotic and narrowed proximal rectum.  Required rectosigmoid resection and ultimately anastomosis to mid rectum.   It is a 29 EEA stapled anastomosis 8-10 cm from anal verge.  Closest edge posteriorly.  Assessment  Recovering OK  [facility] Stay = 3 days)  Plan:  ERAS protocol.  Bowel regimen slow down and thicken bowels since had some urgency incontinence.  Advance diet - solid  Mobilize.  Hypertensive control.  Increase Cozaar.  Continue Corgard and diltiazem.  Continue as needed medications  History of atrial fibrillation on chronic anticoagulation.  Restarted Eliquis. POD#2 3/11.    Check hemoglobin.  Mild scant heme [place] easy not surprising but not significant.  Follow-up polymyalgia on her lower steroid dose.  Make ziniflex scheduled w pain.  Cont tylenol RTC.  Gabapentin at bedtime PRN meds as well  VTE prophylaxis- SCDs, etc  mobilize as tolerated to help recovery  Disposition:  Disposition:  The patient is from: Home  Anticipate discharge to:  Home with Home Health  Anticipated Date of Discharge is:  March 13,2022    Barriers to discharge:  Pending Clinical improvement (more likely than not)  Patient currently is NOT MEDICALLY STABLE for discharge from the hospital from a surgery standpoint.      25 minutes spent in review, evaluation, examination, counseling, and coordination of care.   I have reviewed this patient's available data, including medical history, events of note, physical examination and test results as part of my evaluation.  A significant portion of that time was spent in counseling.  Care during the described time interval was provided by me.  01/20/2021    Subjective: (Chief complaint)  Tolerating solid diet okay.  A lot of loose bowel movements with some incontinence.  Embarrassed.  Worried about increased blood pressure.  Objective:  Vital signs:  Vitals:   01/19/21 2114 01/20/21 0546 01/20/21 0630 01/20/21 0811  BP: (!) 178/80 (!) 177/78 Marland Kitchen)  170/75 (!) 189/73  Pulse:  73 70 66  Resp:  15    Temp:  98.9 F (37.2 C)    TempSrc:   Oral    SpO2:  95%  96%  Weight:      Height:        Last BM Date: 01/20/21  Intake/Output   Yesterday:  03/11 0701 - 03/12 0700 In: 1860 [P.O.:1860] Out: 1211 [Urine:1200; Drains:10; Stool:1] This shift:  Total I/O In: 120 [P.O.:120] Out: 5 [Other:5]  Bowel function:  Flatus: YES  BM:  YES  Drain: Serosanguinous   Physical Exam:  General: Pt awake/alert in no acute distress Eyes: PERRL, normal EOM.  Sclera clear.  No icterus Neuro: CN II-XII intact w/o focal sensory/motor deficits. Lymph: No head/neck/groin lymphadenopathy Psych:  No delerium/psychosis/paranoia.  Oriented x 4 HENT: Normocephalic, Mucus membranes moist.  No thrush Neck: Supple, No tracheal deviation.  No obvious thyromegaly Chest: No pain to chest wall compression.  Good respiratory excursion.  No audible wheezing CV:  Pulses intact.  Regular rhythm.  No major extremity edema MS: Normal AROM mjr joints.  No obvious deformity  Abdomen: Soft.  Nondistended.  Nontender.  No evidence of peritonitis.  No incarcerated hernias.  Ext:   No deformity.  No mjr edema.  No cyanosis Skin: No petechiae / purpurea.  No major sores.  Warm and dry    Results:   Cultures: Recent Results (from the past 720 hour(s))  SARS CORONAVIRUS 2 (TAT 6-24 HRS) Nasopharyngeal Nasopharyngeal Swab     Status: None   Collection Time: 01/15/21 12:42 PM   Specimen: Nasopharyngeal Swab  Result Value Ref Range Status   SARS Coronavirus 2 NEGATIVE NEGATIVE Final    Comment: (NOTE) SARS-CoV-2 target nucleic acids are NOT DETECTED.  The SARS-CoV-2 RNA is generally detectable in upper and lower respiratory specimens during the acute phase of infection. Negative results do not preclude SARS-CoV-2 infection, do not rule out co-infections with other pathogens, and should not be used as the sole basis for treatment or other patient management decisions. Negative results must be combined with clinical observations, patient history,  and epidemiological information. The expected result is Negative.  Fact Sheet for Patients: SugarRoll.be  Fact Sheet for Healthcare Providers: https://www.woods-mathews.com/  This test is not yet approved or cleared by the [place] FDA and  has been authorized for detection and/or diagnosis of SARS-CoV-2 by FDA under an Emergency Use Authorization (EUA). This EUA will remain  in effect (meaning this test can be used) for the duration of the COVID-19 declaration under Se ction 564(b)(1) of the Act, 21 U.S.C. section 360bbb-3(b)(1), unless the authorization is terminated or revoked sooner.  Performed at [facility], [place] [address], [place], [place] [address]     Labs: No results found for this or any previous visit (from the past 48 hour(s)).  Imaging / Studies: No results found.  Medications / Allergies: per chart  Antibiotics: Anti-infectives (From admission, onward)   Start     Dose/Rate Route Frequency Ordered Stop   01/17/21 2200  cefoTEtan (CEFOTAN) 2 g in sodium chloride 0.9 % 100 mL IVPB        2 g 200 mL/hr over 30 Minutes Intravenous Every 12 hours 01/17/21 1541 01/17/21 2130   01/17/21 1400  neomycin (MYCIFRADIN) tablet 1,000 mg  Status:  Discontinued       "And" Linked Group Details   1,000 mg Oral 3 times per day 01/17/21 6712 01/17/21 4580  01/17/21 1400  metroNIDAZOLE (FLAGYL) tablet 1,000 mg  Status:  Discontinued       "And" Linked Group Details   1,000 mg Oral 3 times per day 01/17/21 0652 01/17/21 0656   01/17/21 0700  clindamycin (CLEOCIN) 900 mg, gentamicin (GARAMYCIN) 240 mg in sodium chloride 0.9 % 1,000 mL for intraperitoneal lavage  Status:  Discontinued         Irrigation To Surgery 01/17/21 0652 01/17/21 1256   01/17/21 0700  cefoTEtan (CEFOTAN) 2 g in sodium chloride 0.9 % 100 mL IVPB        2 g 200 mL/hr over 30 Minutes Intravenous On call to O.R. 01/17/21 2025 01/17/21 0833   11/17/20 0800   clindamycin (CLEOCIN) 900 mg, gentamicin (GARAMYCIN) 240 mg in sodium chloride 0.9 % 1,000 mL for intraperitoneal lavage         Irrigation To Surgery 11/16/20 0747 11/18/20 0800        Note: Portions of this report may have been transcribed using voice recognition software. Every effort was made to ensure accuracy; however, inadvertent computerized transcription errors may be present.   Any transcriptional errors that result from this process are unintentional.    [name], MD, FACS, MASCRS  Gastrointestinal and Minimally Invasive Surgery  [facility] [address] [place], [place] [address] [phone] Fax [phone] Main/Paging  CONTACT INFORMATION: Weekday (9AM-5PM) concerns: Call [facility] main office at [phone] Weeknight (5PM-9AM) or Weekend/Holiday concerns: Check [email or web address] for General Surgery [facility] coverage (Please, do not use SecureChat as it is not reliable communication to operating surgeons for immediate patient care)      01/20/2021  10:16 AM

## 2021-01-20 NOTE — Evaluation (Signed)
Occupational Therapy Evaluation Patient Details Name: [name] MRN: [id number] DOB: [birth year] Today's Date: 01/20/2021    History of Present Illness Pt s/p robotic assisted colostomy takedown 01/17/21.  Pt with hx of PVD, back surgery, colostomy 2* perforated diverticulitis 01/06/20 and L hip fx with L THR by posterior approach 12/02/20   Clinical Impression   [name] is a 87 year old woman s/p colostomy takedown who presents supine in bed and reports not having a "bad night" due to frequent diarrhea but agreeable to therapy. On evaluation patient presents with functional UB strength, ability to perform bed mobility and functional mobility with RW without assistance. Patient needed assistance to don Depends - which is her baseline due to Posterior Hip Precautions - and then able to stand and perform grooming tasks. Patient appears to be at her baseline in regards to self care tasks and functional mobility. No OT needs at this time. Recommend return home with family and aide assistance.    Follow Up Recommendations  No OT follow up    Equipment Recommendations  None recommended by OT    Recommendations for Other Services       Precautions / Restrictions Precautions Precautions: Fall;Posterior Hip Precaution Comments: Pt able to recall posterior THP Restrictions Weight Bearing Restrictions: No      Mobility Bed Mobility Overal bed mobility: Modified Independent                  Transfers Overall transfer level: Modified independent Equipment used: Rolling walker (2 wheeled)             General transfer comment: Mod I for bed mobility. Mod I with use of RW to ambulate in room.    Balance Overall balance assessment: Mild deficits observed, not formally tested                                         ADL either performed or assessed with clinical judgement   ADL Overall ADL's : At baseline                                        General ADL Comments: requires assistance or use AE for LB dressing due to posterior hip precautions. Has aide at home and children to assist as needed at home.     Vision Patient Visual Report: No change from baseline       Perception     Praxis      Pertinent Vitals/Pain Pain Assessment: Faces Faces Pain Scale: Hurts a little bit Pain Location: abdomen Pain Descriptors / Indicators: Sore Pain Intervention(s): Monitored during session     Hand Dominance Right   Extremity/Trunk Assessment Upper Extremity Assessment Upper Extremity Assessment: Overall WFL for tasks assessed   Lower Extremity Assessment Lower Extremity Assessment: Overall WFL for tasks assessed   Cervical / Trunk Assessment Cervical / Trunk Assessment: Normal   Communication Communication Communication: No difficulties   Cognition Arousal/Alertness: Awake/alert Behavior During Therapy: WFL for tasks assessed/performed Overall Cognitive Status: Within Functional Limits for tasks assessed                                     General Comments  Exercises     Shoulder Instructions      Home Living Family/patient expects to be discharged to:: Private residence Living Arrangements: Other (Comment) Available Help at Discharge: Family;Personal care attendant;Available PRN/intermittently Type of Home: House Home Access: Stairs to enter [employer] of Steps: 2 Entrance Stairs-Rails: Right;Left Home Layout: One level     Bathroom Shower/Tub: Occupational psychologist: Handicapped height Bathroom Accessibility: Yes   Home Equipment: Environmental consultant - 2 wheels;Grab bars - tub/shower;Bedside commode;Cane - single point   Additional Comments: Son and daughter live next door and take runs stayring at night.  Pt has PCA 4x/wk from 9a-3p      Prior Functioning/Environment Level of Independence: Needs assistance  Gait / Transfers Assistance Needed: Mod indep  ambulating with RW              OT Problem List: Pain      OT Treatment/Interventions:      OT Goals(Current goals can be found in the care plan section) Acute Rehab OT Goals Patient Stated Goal: Regain IND OT Goal Formulation: All assessment and education complete, DC therapy  OT Frequency:     Barriers to D/C:            Co-evaluation              AM-PAC OT "6 Clicks" Daily Activity     Outcome Measure Help from another person eating meals?: None Help from another person taking care of personal grooming?: None Help from another person toileting, which includes using toliet, bedpan, or urinal?: None Help from another person bathing (including washing, rinsing, drying)?: None Help from another person to put on and taking off regular upper body clothing?: None Help from another person to put on and taking off regular lower body clothing?: A Little 6 Click Score: 23   End of Session Equipment Utilized During Treatment: Rolling walker Nurse Communication: Mobility status  Activity Tolerance: Patient tolerated treatment well Patient left: in chair;with call bell/phone within reach  OT Visit Diagnosis: Pain                Time: 2035-5974 OT Time Calculation (min): 17 min Charges:  OT General Charges $OT Visit: 1 Visit OT Evaluation $OT Eval Low Complexity: 1 Low  [name], OTR/L [place]  Office [phone] Pager: [phone]   Lenward Chancellor 01/20/2021, 12:50 PM

## 2021-01-21 NOTE — Discharge Summary (Signed)
Physician Discharge Summary    Patient ID: [name] MRN: [id number] DOB/AGE: 85/16/1924  87 y.o.  Patient Care Team: [name], MD as PCP - General (Family Medicine) Susa Day, MD as Consulting Physician (Orthopedic Surgery) Druscilla Brownie, MD as Consulting Physician (Dermatology) Greer Pickerel, MD as Consulting Physician (General Surgery) Iran Planas, MD as Consulting Physician (Orthopedic Surgery) Michael Boston, MD as Consulting Physician (Colon and Rectal Surgery) Gavin Pound, MD as Consulting Physician (Rheumatology) Deboraha Sprang, MD as Consulting Physician (Cardiology)  Admit date: 01/17/2021  Discharge date: 01/21/2021  Hospital Stay = 4 days    Discharge Diagnoses:  Principal Problem:   Parastomal hernia s/p colostomy takedown & repair 01/17/2021 Active Problems:   Polymyalgia rheumatica (HCC)   Hypothyroidism   HTN (hypertension)   Positive ANA (antinuclear antibody)   Chronic atrial fibrillation ([place])   History of diverticulitis of colon with perforation   Current chronic use of systemic steroids   Chronic anticoagulation   4 Days Post-Op  01/17/2021  POST-OPERATIVE DIAGNOSIS:   COLOSTOMY FOR COLON RESECTION. DESIRE FOR OSTOMY TAKEDOWN  SURGERY:  01/17/2021  Procedure(s): XI ROBOTIC ASSISTED OSTOMY TAKEDOWN, LYSIS OF ADHESIONS, RECTOSIGMOID RESECTION, BILATERAL TAP BLOCK RIGID PROCTOSCOPY  SURGEON:    Surgeon(s): Michael Boston, MD Leighton Ruff, MD Ileana Roup, MD  Consults: [facility] Course:   The patient underwent the surgery above.  Postoperatively, the patient gradually mobilized and advanced to a solid diet.  Pain and other symptoms were treated aggressively.    By the time of discharge, the patient was walking well the hallways, eating food, having flatus.  Pain was well-controlled on an oral medications.  Based on meeting discharge criteria and continuing to recover, I felt it was safe for the  patient to be discharged from the hospital to further recover with close followup. Postoperative recommendations were discussed in detail.  They are written as well.  Discharged Condition: good  Discharge Exam: Blood pressure (!) 182/75, pulse 75, temperature 98.3 F (36.8 C), temperature source Oral, resp. rate 18, height 5\' 6"  (1.676 m), weight 68.8 kg, SpO2 91 %.  General: Pt awake/alert/oriented x4 in No acute distress Eyes: PERRL, normal EOM.  Sclera clear.  No icterus Neuro: CN II-XII intact w/o focal sensory/motor deficits. Lymph: No head/neck/groin lymphadenopathy Psych:  No delerium/psychosis/paranoia HENT: Normocephalic, Mucus membranes moist.  No thrush Neck: Supple, No tracheal deviation Chest: No chest wall pain w good excursion CV:  Pulses intact.  Regular rhythm MS: Normal AROM mjr joints.  No obvious deformity Abdomen: Soft.  Nondistended.  Nontender.  Dressing with old blood.  No active bleeding.  Pelvic drain with low output and serosanguineous contents.  No evidence of peritonitis.  No incarcerated hernias. Ext:  SCDs BLE.  No mjr edema.  No cyanosis Skin: No petechiae / purpura   Disposition:    Follow-up Information    Michael Boston, MD In 3 weeks.   Specialties: General Surgery, Colon and Rectal Surgery Why: To follow up after your operation, To follow up after your hospital stay Contact information: [place] [address] [phone]               Discharge disposition: 01-Home or Self Care       Discharge Instructions    Call MD for:   Complete by: As directed    FEVER > 101.5 F  (temperatures < 101.5 F are not significant)   Call MD for:  extreme  fatigue   Complete by: As directed    Call MD for:  persistant dizziness or light-headedness   Complete by: As directed    Call MD for:  persistant nausea and vomiting   Complete by: As directed    Call MD for:  redness, tenderness, or signs of infection (pain,  swelling, redness, odor or green/yellow discharge around incision site)   Complete by: As directed    Call MD for:  severe uncontrolled pain   Complete by: As directed    Diet - low sodium heart healthy   Complete by: As directed    Start with a bland diet such as soups, liquids, starchy foods, low fat foods, etc. the first few days at home. Gradually advance to a solid, low-fat, high fiber diet by the end of the first week at home.   Add a fiber supplement to your diet (Metamucil, etc) If you feel full, bloated, or constipated, stay on a full liquid or pureed/blenderized diet for a few days until you feel better and are no longer constipated.   Discharge instructions   Complete by: As directed    See Discharge Instructions If you are not getting better after two weeks or are noticing you are getting worse, contact our office [phone] for further advice.  We may need to adjust your medications, re-evaluate you in the office, send you to the emergency room, or see what other things we can do to help. The clinic staff is available to answer your questions during regular business hours (8:30am-5pm).  Please don't hesitate to call and ask to speak to one of our nurses for clinical concerns.    A surgeon from [facility] is always on call at the hospitals 24 hours/day If you have a medical emergency, go to the nearest emergency room or call 911.   Discharge wound care:   Complete by: As directed    It is good for closed incisions and even open wounds to be washed every day.  Shower every day.  Short baths are fine.  Wash the incisions and wounds clean with soap & water.    You may leave closed incisions open to air if it is dry.   You may cover the incision with clean gauze & replace it after your daily shower for comfort.   Driving Restrictions   Complete by: As directed    You may drive when: - you are no longer taking narcotic prescription pain medication - you can  comfortably wear a seatbelt - you can safely make sudden turns/stops without pain.   Increase activity slowly   Complete by: As directed    Start light daily activities --- self-care, walking, climbing stairs- beginning the day after surgery.  Gradually increase activities as tolerated.  Control your pain to be active.  Stop when you are tired.  Ideally, walk several times a day, eventually an hour a day.   Most people are back to most day-to-day activities in a few weeks.  It takes 4-6 weeks to get back to unrestricted, intense activity. If you can walk 30 minutes without difficulty, it is safe to try more intense activity such as jogging, treadmill, bicycling, low-impact aerobics, swimming, etc. Save the most intensive and strenuous activity for last (Usually 4-8 weeks after surgery) such as sit-ups, heavy lifting, contact sports, etc.  Refrain from any intense heavy lifting or straining until you are off narcotics for pain control.  You will have off days, but  things should improve week-by-week. DO NOT PUSH THROUGH PAIN.  Let pain be your guide: If it hurts to do something, don't do it.   Lifting restrictions   Complete by: As directed    If you can walk 30 minutes without difficulty, it is safe to try more intense activity such as jogging, treadmill, bicycling, low-impact aerobics, swimming, etc. Save the most intensive and strenuous activity for last (Usually 4-8 weeks after surgery) such as sit-ups, heavy lifting, contact sports, etc.   Refrain from any intense heavy lifting or straining until you are off narcotics for pain control.  You will have off days, but things should improve week-by-week. DO NOT PUSH THROUGH PAIN.  Let pain be your guide: If it hurts to do something, don't do it.  Pain is your body warning you to avoid that activity for another week until the pain goes down.   May shower / Bathe   Complete by: As directed    May walk up steps   Complete by: As directed    Remove  dressing in 72 hours   Complete by: As directed    Make sure all dressings are removed by the third day after surgery.  Leave incisions open to air.  OK to cover incisions with gauze or bandages as desired   Sexual Activity Restrictions   Complete by: As directed    You may have sexual intercourse when it is comfortable. If it hurts to do something, stop.      Allergies as of 01/21/2021      Reactions   Keflex [cephalexin] Nausea Only, Other (See Comments)   Pt ended up in ER w/ CHEST PAIN   Codeine Other (See Comments)   "Nightmares, imagined things"      Medication List    STOP taking these medications   senna-docusate 8.6-50 MG tablet Commonly known as: Senokot-S     TAKE these medications   acetaminophen 500 MG tablet Commonly known as: TYLENOL Take 500 mg by mouth at bedtime.   acetaminophen 325 MG tablet Commonly known as: TYLENOL Take 1-2 tablets (325-650 mg total) by mouth every 4 (four) hours as needed for mild pain.   alum & mag hydroxide-simeth 200-200-20 MG/5ML suspension Commonly known as: MAALOX/MYLANTA Take 30 mLs by mouth every 6 (six) hours as needed for indigestion or heartburn.   apixaban 5 MG Tabs tablet Commonly known as: ELIQUIS Take 1 tablet (5 mg total) by mouth 2 (two) times daily.   bisacodyl 10 MG suppository Commonly known as: DULCOLAX Place 1 suppository (10 mg total) rectally daily as needed for moderate constipation.   diltiazem 120 MG 24 hr capsule Commonly known as: DILACOR XR Take 1 capsule (120 mg total) by mouth daily.   docusate sodium 100 MG capsule Commonly known as: COLACE Take 1 capsule (100 mg total) by mouth 2 (two) times daily.   gabapentin 300 MG capsule Commonly known as: NEURONTIN Take 300 mg by mouth at bedtime.   levothyroxine 50 MCG tablet Commonly known as: SYNTHROID TAKE 1 TABLET BY MOUTH EVERY DAY What changed: when to take this   losartan 50 MG tablet Commonly known as: COZAAR TAKE 1 TABLET BY MOUTH  EVERY DAY What changed:   how much to take  how to take this  when to take this  additional instructions   nadolol 80 MG tablet Commonly known as: CORGARD TAKE 1 TABLET BY MOUTH EVERY DAY What changed:   when to take this  additional instructions  pantoprazole 40 MG tablet Commonly known as: PROTONIX Take 1 tablet (40 mg total) by mouth daily. What changed: when to take this   polyethylene glycol 17 g packet Commonly known as: MIRALAX / GLYCOLAX Take 17 g by mouth See admin instructions. Mix 17 grams of powder into 8 ounces of water and drink two times a day and an additional 17 grams once daily as needed for constiptaion   predniSONE 1 MG tablet Commonly known as: DELTASONE Take 4 tablets (4 mg total) by mouth daily with lunch.   PRESERVISION AREDS 2 PO Take 1 tablet by mouth daily at 12 noon.   sucralfate 1 g tablet Commonly known as: Carafate Take 1 tablet (1 g total) by mouth with breakfast, with lunch, and with evening meal.   tizanidine 2 MG capsule Commonly known as: ZANAFLEX Take 1 capsule (2 mg total) by mouth 2 (two) times daily as needed for muscle spasms.   traMADol 50 MG tablet Commonly known as: ULTRAM Take 1-2 tablets (50-100 mg total) by mouth every 6 (six) hours as needed for moderate pain or severe pain.     ASK your doctor about these medications   senna-docusate 8.6-50 MG tablet Commonly known as: Senokot-S Take 1 tablet by mouth at bedtime. Ask about: Should I take this medication?            Discharge Care Instructions  (From admission, onward)         Start     Ordered   01/21/21 0000  Discharge wound care:       Comments: It is good for closed incisions and even open wounds to be washed every day.  Shower every day.  Short baths are fine.  Wash the incisions and wounds clean with soap & water.    You may leave closed incisions open to air if it is dry.   You may cover the incision with clean gauze & replace it after your  daily shower for comfort.   01/21/21 0845          Significant Diagnostic Studies:  Results for orders placed or performed during the hospital encounter of 01/17/21 (from the past 72 hour(s))  CBC     Status: Abnormal   Collection Time: 01/20/21 10:28 AM  Result Value Ref Range   WBC 11.3 (H) 4.0 - 10.5 K/uL   RBC 3.13 (L) 3.87 - 5.11 MIL/uL   Hemoglobin 9.1 (L) 12.0 - 15.0 g/dL   HCT 29.4 (L) 36.0 - 46.0 %   MCV 93.9 80.0 - 100.0 fL   MCH 29.1 26.0 - 34.0 pg   MCHC 31.0 30.0 - 36.0 g/dL   RDW 13.9 11.5 - 15.5 %   Platelets 385 150 - 400 K/uL   nRBC 0.0 0.0 - 0.2 %    Comment: Performed at [facility], [place] [address], [place], [place] [address]  Potassium     Status: None   Collection Time: 01/20/21 10:28 AM  Result Value Ref Range   Potassium 3.6 3.5 - 5.1 mmol/L    Comment: Performed at [facility], [place] [address], [place], [place] [address]  Magnesium     Status: None   Collection Time: 01/20/21 10:28 AM  Result Value Ref Range   Magnesium 1.8 1.7 - 2.4 mg/dL    Comment: Performed at [facility], [place] [address] [place], [place] [address]  Phosphorus     Status: Abnormal   Collection Time: 01/20/21 10:28  AM  Result Value Ref Range   Phosphorus 2.3 (L) 2.5 - 4.6 mg/dL    Comment: Performed at [facility], [place] [address], [place], [place] [address]    No results found.  Past Medical History:  Diagnosis Date  . Closed fracture of left distal radius 12/29/2019  . DVT (deep venous thrombosis) ([place]) 09/2015   RLE  . Dysrhythmia    Afib  . GERD (gastroesophageal reflux disease)   . Hip fracture ([place]) 12/02/2020  . Hypertension   . Hypothyroidism   . 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 Peripheral vascular disease ([place])    peripheral neuropathy    Past Surgical History:  Procedure Laterality Date  . ABDOMINAL HYSTERECTOMY    . APPENDECTOMY    . BACK  SURGERY    . BREAST SURGERY     left breast biopsy  . CARDIOVERSION N/A 05/09/2020   Procedure: CARDIOVERSION;  Surgeon: Skeet Latch, MD;  Location: [place];  Service: Cardiovascular;  Laterality: N/A;  . COLOSTOMY N/A 01/06/2020   Procedure: Colostomy;  Surgeon: Greer Pickerel, MD;  Location: [facility];  Service: General;  Laterality: N/A;  . EYE SURGERY     cataract  . HARDWARE REMOVAL Left 01/01/2020   Procedure: HARDWARE REMOVAL;  Surgeon: Iran Planas, MD;  Location: [facility];  Service: Orthopedics;  Laterality: Left;  . HIP ARTHROPLASTY Left 12/03/2020   Procedure: ARTHROPLASTY BIPOLAR HIP (HEMIARTHROPLASTY);  Surgeon: Renette Butters, MD;  Location: [facility];  Service: Orthopedics;  Laterality: Left;  . HIP ARTHROPLASTY Left    partial  . LAPAROTOMY N/A 01/06/2020   Procedure: Exploratory Laparotomy, Open Sigmoid colectomy;  Surgeon: Greer Pickerel, MD;  Location: [facility];  Service: General;  Laterality: N/A;  . LIPOMA EXCISION Left 04/25/2015   Procedure: EXCISION OF LIPOMA LEFT ARM;  Surgeon: Donnie Mesa, MD;  Location: [facility];  Service: General;  Laterality: Left;  . LUMBAR LAMINECTOMY/DECOMPRESSION MICRODISCECTOMY  11/20/2011   Procedure: LUMBAR LAMINECTOMY/DECOMPRESSION MICRODISCECTOMY;  Surgeon: Jeneen Rinks P [place];  Location: [facility];  Service: Orthopedics;  Laterality: N/A;  Decompressive Laminectomy L2 to the Sacrum (X-Ray)  . LYSIS OF ADHESION N/A 01/17/2021   Procedure: RIGID PROCTOSCOPY;  Surgeon: Michael Boston, MD;  Location: [facility];  Service: General;  Laterality: N/A;  . OPEN REDUCTION INTERNAL FIXATION (ORIF) DISTAL RADIAL FRACTURE Left 01/01/2020   Procedure: OPEN REDUCTION INTERNAL FIXATION (ORIF) DISTAL RADIAL FRACTURE;  Surgeon: Iran Planas, MD;  Location: [facility];  Service: Orthopedics;  Laterality: Left;  . OTHER SURGICAL HISTORY     left wrist surgery - has plate in left wrist   . OTHER SURGICAL HISTORY     right knee surgery due to torn cartilage   .  TONSILLECTOMY    . WRIST FRACTURE SURGERY Left   . XI ROBOTIC ASSISTED COLOSTOMY TAKEDOWN N/A 01/17/2021   Procedure: XI ROBOTIC ASSISTED OSTOMY TAKEDOWN, LYSIS OF ADHESIONS, RECTOSIGMOID RESECTION, BILATERAL TAP BLOCK;  Surgeon: Michael Boston, MD;  Location[facility];  Service: General;  Laterality: N/A;    Social History   Socioeconomic History  . Marital status: Widowed    Spouse name: Not on file  . Number of children: Not on file  . Years of education: Not on file  . Highest education level: Not on file  Occupational History  . Not on file  Tobacco Use  . Smoking status: Never Smoker  . Smokeless tobacco: Never Used  Vaping Use  .  Vaping Use: Never used  Substance and Sexual Activity  . Alcohol use: No  . Drug use: No  . Sexual activity: Never  Other Topics Concern  . Not on file  Social History Narrative  . Not on file   Social Determinants of Health   Financial Resource Strain: Low Risk   . Difficulty of Paying Living Expenses: Not hard at all  Food Insecurity: No Food Insecurity  . Worried About Charity fundraiser in the Last Year: Never true  . Ran Out of Food in the Last Year: Never true  Transportation Needs: No Transportation Needs  . Lack of Transportation (Medical): No  . Lack of Transportation (Non-Medical): No  Physical Activity: Insufficiently Active  . Days of Exercise per Week: 2 days  . Minutes of Exercise per Session: 60 min  Stress: No Stress Concern Present  . Feeling of Stress : Not at all  Social Connections: Moderately Isolated  . Frequency of Communication with Friends and Family: More than three times a week  . Frequency of Social Gatherings with Friends and Family: Once a week  . Attends Religious Services: More than 4 times per year  . Active Member of Clubs or Organizations: No  . Attends Archivist Meetings: Never  . Marital Status: Widowed  Intimate Partner Violence: Not At Risk  . Fear of Current or Ex-Partner: No  .  Emotionally Abused: No  . Physically Abused: No  . Sexually Abused: No    Family History  Problem Relation Age of Onset  . Cancer Mother        BREAST  . COPD Father   . Emphysema Father   . Cancer Daughter        breast    Current Facility-Administered Medications  Medication Dose Route Frequency Provider Last Rate Last Admin  . acetaminophen (TYLENOL) tablet 1,000 mg  1,000 mg Oral Lajuana Ripple, MD   1,000 mg at 01/21/21 0511  . alum & mag hydroxide-simeth (MAALOX/MYLANTA) 200-200-20 MG/5ML suspension 30 mL  30 mL Oral Q6H PRN Michael Boston, MD      . apixaban [name]) tablet 5 mg  5 mg Oral BID Michael Boston, MD   5 mg at 01/20/21 2108  . diltiazem (CARDIZEM CD) 24 hr capsule 120 mg  120 mg Oral Daily Michael Boston, MD   120 mg at 01/20/21 0826  . diphenhydrAMINE (BENADRYL) 12.5 MG/5ML elixir 12.5 mg  12.5 mg Oral Q6H PRN Michael Boston, MD       Or  . diphenhydrAMINE (BENADRYL) injection 12.5 mg  12.5 mg Intravenous Q6H PRN Michael Boston, MD      . enalaprilat (VASOTEC) injection 0.625-1.25 mg  0.625-1.25 mg Intravenous Q6H PRN Michael Boston, MD      . feeding supplement (ENSURE SURGERY) liquid 237 mL  237 mL Oral BID BM Michael Boston, MD   237 mL at 01/19/21 1056  . gabapentin (NEURONTIN) capsule 300 mg  300 mg Oral Ardeen Fillers, MD   300 mg at 01/20/21 2108  . HYDROmorphone (DILAUDID) injection 0.5-2 mg  0.5-2 mg Intravenous Q4H PRN Michael Boston, MD      . levothyroxine (SYNTHROID) tablet 50 mcg  50 mcg Oral QAC breakfast Michael Boston, MD   50 mcg at 01/21/21 0511  . lip balm (CARMEX) ointment 1 application  1 application Topical BID Michael Boston, MD   1 application at 95/18/84 2108  . losartan (COZAAR) tablet 100 mg  100 mg Oral Daily Darik Massing,  Remo Lipps, MD   100 mg at 01/20/21 0836  . magic mouthwash  15 mL Oral QID PRN Michael Boston, MD      . melatonin tablet 3 mg  3 mg Oral QHS PRN Michael Boston, MD   3 mg at 01/18/21 2123  . nadolol (CORGARD) tablet 40 mg  40 mg  Oral q AM Michael Boston, MD   40 mg at 01/20/21 1159  . ondansetron (ZOFRAN) tablet 4 mg  4 mg Oral Q6H PRN Michael Boston, MD       Or  . ondansetron (ZOFRAN) injection 4 mg  4 mg Intravenous Q6H PRN Michael Boston, MD      . pantoprazole (PROTONIX) EC tablet 40 mg  40 mg Oral BID Rogelia Mire, MD   40 mg at 01/20/21 1621  . polycarbophil (FIBERCON) tablet 625 mg  625 mg Oral BID Michael Boston, MD   625 mg at 01/20/21 2108  . predniSONE (DELTASONE) tablet 4 mg  4 mg Oral Q lunch Michael Boston, MD   4 mg at 01/20/21 1155  . prochlorperazine (COMPAZINE) tablet 10 mg  10 mg Oral Q6H PRN Michael Boston, MD       Or  . prochlorperazine (COMPAZINE) injection 5-10 mg  5-10 mg Intravenous Q6H PRN Michael Boston, MD      . simethicone (MYLICON) chewable tablet 40 mg  40 mg Oral Q6H PRN Michael Boston, MD      . sucralfate (CARAFATE) tablet 1 g  1 g Oral TID with meals Michael Boston, MD   1 g at 01/20/21 1749  . tiZANidine (ZANAFLEX) tablet 2 mg  2 mg Oral TID Michael Boston, MD   2 mg at 01/20/21 2108  . traMADol (ULTRAM) tablet 50-100 mg  50-100 mg Oral Q6H PRN Michael Boston, MD   50 mg at 01/19/21 0755     Allergies  Allergen Reactions  . Keflex [Cephalexin] Nausea Only and Other (See Comments)    Pt ended up in ER w/ CHEST PAIN  . Codeine Other (See Comments)    "Nightmares, imagined things"    Signed: Morton Peters, MD, FACS, MASCRS  Gastrointestinal and Minimally Invasive Surgery  [facility] [address], [place], [place] [address] [phone] Fax [phone] Main/Paging  CONTACT INFORMATION: Weekday (9AM-5PM) concerns: Call [facility] main office at [phone] Weeknight (5PM-9AM) or Weekend/Holiday concerns: Check [email or web address] for General Surgery [facility] coverage (Please, do not use SecureChat as it is not reliable communication to operating surgeons for immediate patient care)      01/21/2021, 8:45 AM

## 2021-01-21 NOTE — Plan of Care (Signed)
Reviewed written d/c instructions w pt and family, all questions answered and they verb understanding. Reviewed wound care instructions and pt given a sm supply of 4 X 4 guaze and tape. Pt d/c via w/c w all belongings in stable condition.

## 2021-01-21 NOTE — Progress Notes (Signed)
Removed all dressings on pt's abd, including packing on the LLQ incision and the JP on the RLQ. Sm amt oozing from both sites, covered w dry guaze and tape.

## 2021-01-22 ENCOUNTER — Other Ambulatory Visit: Payer: Self-pay

## 2021-01-22 ENCOUNTER — Telehealth: Payer: Self-pay | Admitting: Family Medicine

## 2021-01-22 DIAGNOSIS — K2100 Gastro-esophageal reflux disease with esophagitis, without bleeding: Secondary | ICD-10-CM | POA: Diagnosis not present

## 2021-01-22 DIAGNOSIS — I951 Orthostatic hypotension: Secondary | ICD-10-CM | POA: Diagnosis not present

## 2021-01-22 DIAGNOSIS — I739 Peripheral vascular disease, unspecified: Secondary | ICD-10-CM | POA: Diagnosis not present

## 2021-01-22 DIAGNOSIS — G629 Polyneuropathy, unspecified: Secondary | ICD-10-CM | POA: Diagnosis not present

## 2021-01-22 DIAGNOSIS — S72002D Fracture of unspecified part of neck of left femur, subsequent encounter for closed fracture with routine healing: Secondary | ICD-10-CM | POA: Diagnosis not present

## 2021-01-22 DIAGNOSIS — I110 Hypertensive heart disease with heart failure: Secondary | ICD-10-CM | POA: Diagnosis not present

## 2021-01-22 DIAGNOSIS — I480 Paroxysmal atrial fibrillation: Secondary | ICD-10-CM | POA: Diagnosis not present

## 2021-01-22 DIAGNOSIS — M353 Polymyalgia rheumatica: Secondary | ICD-10-CM | POA: Diagnosis not present

## 2021-01-22 DIAGNOSIS — I5032 Chronic diastolic (congestive) heart failure: Secondary | ICD-10-CM | POA: Diagnosis not present

## 2021-01-22 DIAGNOSIS — I10 Essential (primary) hypertension: Secondary | ICD-10-CM

## 2021-01-22 MED ORDER — NADOLOL 40 MG PO TABS: 40.0000 mg | ORAL_TABLET | Freq: Every day | ORAL | 1 refills | Status: DC

## 2021-01-22 MED ORDER — DILTIAZEM HCL ER 60 MG PO CP12: 60.0000 mg | ORAL_CAPSULE | Freq: Two times a day (BID) | ORAL | 1 refills | Status: DC

## 2021-01-22 NOTE — Telephone Encounter (Signed)
Typically when people get d/c'd from rehab we have a visit with them to make sure all med lists are correct and things are up to date.  If this is not already scheduled, we need to make an appt.  At this time, I would prefer-  Nadolol 40mg  daily, #90, 1 refill Losartan 50mg  daily, #90, 1 refill Diltiazem 60mg  BID, #180, 1 refill

## 2021-01-22 NOTE — Telephone Encounter (Signed)
Spoke with daughter about Patient medication. Daughter stated that patient meds was adjusted while she was in [facility] rehab. Her Losartan was increased to 75 from 50mg . Her Diltiazem needs to be 60 mg taken BID instead of 120 daily because it seems to work better for her taking it like that. Also her Nadolol is 40mg  daily. Daughter wants to know if she she continue the Losartan on 75mg  or 50mg ? Please advise if it is ok to send to pharmacy.

## 2021-01-22 NOTE — Telephone Encounter (Signed)
[name] called in stating that her mom is needing refills on the Nadolol 40mg  once a day and she needs Diltiazem 60mg  tabs she takes 2X a day.   She also needs to know for the losartan does pt need to go back to 75 mgs once a day or 100mg s once a day.  She states she had surgery last week and the hospital changed the losartan to 100mg .  Daughter states that our system is not up to date with the medication changes that took place at [facility]

## 2021-01-22 NOTE — Telephone Encounter (Signed)
Called and scheduled a video visit with patient on 3/24/ at 1:30 for a follow up visit to discuss med changes.

## 2021-01-22 NOTE — Telephone Encounter (Signed)
Called and spoke with patient daughter and she stated that they were just here on 01/10/21. She also stated that she has plenty of the 50 mg of Losartan so I did not need to send that in. I told her that she could do a video visit.

## 2021-01-22 NOTE — Telephone Encounter (Signed)
I understand they were just here, but in order for [place] to keep up to date on changes that were made when she was somewhere else, a follow up visit is always recommended.  We are not forcing the issue, just trying to provide the best care possible and make sure everything is correct

## 2021-01-23 ENCOUNTER — Telehealth: Payer: Self-pay | Admitting: Family Medicine

## 2021-01-23 NOTE — Telephone Encounter (Signed)
Called home health nurse Tanvi at number given. No answer or vm.

## 2021-01-23 NOTE — Telephone Encounter (Signed)
Tanvi from Home health needs to speak to you about patients medication.

## 2021-01-28 DIAGNOSIS — M353 Polymyalgia rheumatica: Secondary | ICD-10-CM | POA: Diagnosis not present

## 2021-01-28 DIAGNOSIS — I739 Peripheral vascular disease, unspecified: Secondary | ICD-10-CM | POA: Diagnosis not present

## 2021-01-28 DIAGNOSIS — I480 Paroxysmal atrial fibrillation: Secondary | ICD-10-CM | POA: Diagnosis not present

## 2021-01-28 DIAGNOSIS — Z48815 Encounter for surgical aftercare following surgery on the digestive system: Secondary | ICD-10-CM | POA: Diagnosis not present

## 2021-01-28 DIAGNOSIS — I951 Orthostatic hypotension: Secondary | ICD-10-CM | POA: Diagnosis not present

## 2021-01-28 DIAGNOSIS — I5032 Chronic diastolic (congestive) heart failure: Secondary | ICD-10-CM | POA: Diagnosis not present

## 2021-01-28 DIAGNOSIS — S72002D Fracture of unspecified part of neck of left femur, subsequent encounter for closed fracture with routine healing: Secondary | ICD-10-CM | POA: Diagnosis not present

## 2021-01-28 DIAGNOSIS — G629 Polyneuropathy, unspecified: Secondary | ICD-10-CM | POA: Diagnosis not present

## 2021-01-28 DIAGNOSIS — I110 Hypertensive heart disease with heart failure: Secondary | ICD-10-CM | POA: Diagnosis not present

## 2021-01-29 DIAGNOSIS — R2689 Other abnormalities of gait and mobility: Secondary | ICD-10-CM | POA: Diagnosis not present

## 2021-01-29 DIAGNOSIS — S7292XA Unspecified fracture of left femur, initial encounter for closed fracture: Secondary | ICD-10-CM | POA: Diagnosis not present

## 2021-01-29 DIAGNOSIS — M6281 Muscle weakness (generalized): Secondary | ICD-10-CM | POA: Diagnosis not present

## 2021-01-29 DIAGNOSIS — I10 Essential (primary) hypertension: Secondary | ICD-10-CM | POA: Diagnosis not present

## 2021-01-29 DIAGNOSIS — S7292XS Unspecified fracture of left femur, sequela: Secondary | ICD-10-CM | POA: Diagnosis not present

## 2021-01-29 DIAGNOSIS — M7062 Trochanteric bursitis, left hip: Secondary | ICD-10-CM | POA: Diagnosis not present

## 2021-01-29 DIAGNOSIS — Z9181 History of falling: Secondary | ICD-10-CM | POA: Diagnosis not present

## 2021-01-31 ENCOUNTER — Other Ambulatory Visit: Payer: Self-pay

## 2021-01-31 ENCOUNTER — Other Ambulatory Visit (INDEPENDENT_AMBULATORY_CARE_PROVIDER_SITE_OTHER): Payer: Medicare PPO

## 2021-01-31 DIAGNOSIS — R350 Frequency of micturition: Secondary | ICD-10-CM

## 2021-01-31 LAB — POC URINALSYSI DIPSTICK (AUTOMATED)
Bilirubin, UA: NEGATIVE
Blood, UA: POSITIVE
Glucose, UA: NEGATIVE
Ketones, UA: NEGATIVE
Nitrite, UA: NEGATIVE
Protein, UA: POSITIVE — AB
Spec Grav, UA: 1.015 (ref 1.010–1.025)
Urobilinogen, UA: NEGATIVE E.U./dL — AB
pH, UA: 7.5 (ref 5.0–8.0)

## 2021-01-31 MED ORDER — SULFAMETHOXAZOLE-TRIMETHOPRIM 800-160 MG PO TABS: ORAL_TABLET | ORAL | 0 refills | Status: DC

## 2021-01-31 NOTE — Addendum Note (Signed)
Addended by: Lerry Liner on: 01/31/2021 02:59 PM   Modules accepted: Orders

## 2021-02-01 ENCOUNTER — Telehealth (INDEPENDENT_AMBULATORY_CARE_PROVIDER_SITE_OTHER): Payer: Medicare PPO | Admitting: Family Medicine

## 2021-02-01 ENCOUNTER — Encounter: Payer: Self-pay | Admitting: Family Medicine

## 2021-02-01 DIAGNOSIS — R350 Frequency of micturition: Secondary | ICD-10-CM

## 2021-02-01 DIAGNOSIS — I10 Essential (primary) hypertension: Secondary | ICD-10-CM

## 2021-02-01 DIAGNOSIS — K435 Parastomal hernia without obstruction or  gangrene: Secondary | ICD-10-CM

## 2021-02-01 MED ORDER — LOSARTAN POTASSIUM 100 MG PO TABS: 100.0000 mg | ORAL_TABLET | Freq: Every day | ORAL | 3 refills | Status: DC

## 2021-02-01 NOTE — Progress Notes (Signed)
Virtual Visit via Video   I connected with patient on 02/01/21 at  1:00 PM EDT by a video enabled telemedicine application and verified that I am speaking with the correct person using two identifiers.  Location patient: Home Location provider: [facility] Office Persons participating in the virtual visit: Patient, Provider, [place] (Sabrina M)  I discussed the limitations of evaluation and management by telemedicine and the availability of in person appointments. The patient expressed understanding and agreed to proceed.  Subjective:   HPI:   Hospital f/u- pt was admitted 3/9-13 for revision of her colostomy.  Pt reports she thinks she is 'doing great'.  Yesterday she noted urinary frequency and was able to provide a specimen.  This appeared to be infected and she was started on Bactrim.  Some improvement in her urinary sxs.  Pt denies pain at operative site.  No bandage required at this time.  Pt feels she may be having too many BMs daily- 3-4x/day.  Medication adjustments- changes were made at [facility] (rehab) and those did not translate to our system.  Goal today is to review and update.  ROS:   See pertinent positives and negatives per HPI.  Patient Active Problem List   Diagnosis Date Noted  . Chronic anticoagulation 01/17/2021  . Parastomal hernia s/p colostomy takedown & repair 01/17/2021 01/17/2021  . Polymyalgia rheumatica ([place]) 01/11/2021  . (HFpEF) heart failure with preserved ejection fraction ([place]) 10/27/2020  . Injury of extensor or abductor muscle, fascia, or tendon of thumb at forearm level 08/03/2020  . Orthostatic hypotension 05/24/2020  . Acute blood loss anemia 05/19/2020  . Traumatic hematoma of left hip 05/19/2020  . Current chronic use of systemic steroids 05/19/2020  . Advanced age 19/12/2019  . Secondary hypercoagulable state ([place]) 03/08/2020  . Hypokalemia 02/15/2020  . History of diverticulitis of colon with perforation 01/26/2020  . Anemia    . Debility 01/14/2020  . Physical debility 01/14/2020  . Chronic atrial fibrillation ([place])   . Recurrent falls 12/29/2019  . Positive ANA (antinuclear antibody) 12/29/2019  . Osteoarthritis 11/13/2017  . Hypertriglyceridemia 01/29/2017  . Excessive gas 01/09/2017  . Physical exam 07/31/2016  . Elevated lipase 03/06/2016  . Chest pain 03/06/2016  . Osteopenia 02/08/2016  . Hypothyroidism 01/26/2016  . HTN (hypertension) 01/26/2016  . Left leg numbness 10/06/2012    Social History   Tobacco Use  . Smoking status: Never Smoker  . Smokeless tobacco: Never Used  Substance Use Topics  . Alcohol use: No    Current Outpatient Medications:  .  acetaminophen (TYLENOL) 500 MG tablet, Take 500 mg by mouth at bedtime., Disp: , Rfl:  .  apixaban (ELIQUIS) 5 MG TABS tablet, Take 1 tablet (5 mg total) by mouth 2 (two) times daily., Disp: 180 tablet, Rfl: 3 .  diltiazem (CARDIZEM SR) 60 MG 12 hr capsule, Take 1 capsule (60 mg total) by mouth 2 (two) times daily., Disp: 180 capsule, Rfl: 1 .  docusate sodium (COLACE) 100 MG capsule, Take 1 capsule (100 mg total) by mouth 2 (two) times daily., Disp: 60 capsule, Rfl: 0 .  gabapentin (NEURONTIN) 300 MG capsule, Take 300 mg by mouth at bedtime., Disp: , Rfl:  .  levothyroxine (SYNTHROID) 50 MCG tablet, TAKE 1 TABLET BY MOUTH EVERY DAY (Patient taking differently: Take 50 mcg by mouth daily before breakfast.), Disp: 90 tablet, Rfl: 1 .  losartan (COZAAR) 50 MG tablet, TAKE 1 TABLET BY MOUTH EVERY DAY (Patient taking differently: Patient is taking  75mg  1x daily), Disp: 90 tablet, Rfl: 1 .  nadolol (CORGARD) 40 MG tablet, Take 1 tablet (40 mg total) by mouth daily., Disp: 90 tablet, Rfl: 1 .  pantoprazole (PROTONIX) 40 MG tablet, Take 1 tablet (40 mg total) by mouth daily. (Patient taking differently: Take 40 mg by mouth 2 (two) times daily before a meal.), Disp: 90 tablet, Rfl: 1 .  predniSONE (DELTASONE) 1 MG tablet, Take 4 tablets (4 mg total) by  mouth daily with lunch., Disp: , Rfl:  .  sucralfate (CARAFATE) 1 g tablet, Take 1 tablet (1 g total) by mouth with breakfast, with lunch, and with evening meal., Disp: 90 tablet, Rfl: 0 .  sulfamethoxazole-trimethoprim (BACTRIM DS) 800-160 MG tablet, Take one tablet twice a day for FIVE days., Disp: 10 tablet, Rfl: 0 .  acetaminophen (TYLENOL) 325 MG tablet, Take 1-2 tablets (325-650 mg total) by mouth every 4 (four) hours as needed for mild pain. (Patient not taking: No sig reported), Disp:  , Rfl:  .  alum & mag hydroxide-simeth (MAALOX/MYLANTA) 200-200-20 MG/5ML suspension, Take 30 mLs by mouth every 6 (six) hours as needed for indigestion or heartburn. (Patient not taking: No sig reported), Disp: 355 mL, Rfl: 0 .  bisacodyl (DULCOLAX) 10 MG suppository, Place 1 suppository (10 mg total) rectally daily as needed for moderate constipation. (Patient not taking: No sig reported), Disp: 12 suppository, Rfl: 0 .  Multiple Vitamins-Minerals (PRESERVISION AREDS 2 PO), Take 1 tablet by mouth daily at 12 noon. (Patient not taking: No sig reported), Disp: , Rfl:  .  polyethylene glycol (MIRALAX / GLYCOLAX) 17 g packet, Take 17 g by mouth See admin instructions. Mix 17 grams of powder into 8 ounces of water and drink two times a day and an additional 17 grams once daily as needed for constiptaion (Patient not taking: Reported on 02/01/2021), Disp: , Rfl:  .  tizanidine (ZANAFLEX) 2 MG capsule, Take 1 capsule (2 mg total) by mouth 2 (two) times daily as needed for muscle spasms. (Patient not taking: Reported on 02/01/2021), Disp: 15 capsule, Rfl: 0 .  traMADol (ULTRAM) 50 MG tablet, Take 1-2 tablets (50-100 mg total) by mouth every 6 (six) hours as needed for moderate pain or severe pain. (Patient not taking: Reported on 02/01/2021), Disp: 20 tablet, Rfl: 0  Allergies  Allergen Reactions  . Keflex [Cephalexin] Nausea Only and Other (See Comments)    Pt ended up in ER w/ CHEST PAIN  . Codeine Other (See Comments)     "Nightmares, imagined things"    Objective:   There were no vitals taken for this visit. AAOx3, NAD NCAT, EOMI No obvious CN deficits Coloring WNL Pt is able to speak clearly, coherently without shortness of breath or increased work of breathing.  Thought process is linear.  Mood is appropriate.   Assessment and Plan:   Colostomy reversal- pt is doing very well s/p surgery.  She graduated from rehab and is back home w/ family close by.  No longer having pain.  Able to eat and have BMs w/o difficulty.  HTN- BP meds were changed at rehab.  Now taking Diltiazzem SR 60mg  BID and Losartan 75mg  daily.  Daughter admits that the Losartan is difficult to break and 1.5 tabs is cumbersome.  Given pt's tendency for high blood pressure, will increase Losartan to 100mg  daily and they are to monitor home BP and alert if running low.  Pt expressed understanding and is in agreement w/ plan.   Urinary frequency-  pt was able to provide a urine sample that looked highly suspicious for infxn.  She was started on Bactrim while awaiting culture results.  She reports feeling better today.   Annye Asa, MD 02/01/2021

## 2021-02-01 NOTE — Progress Notes (Signed)
I connected with  [name] on 02/01/21 by a video enabled telemedicine application and verified that I am speaking with the correct person using two identifiers.   I discussed the limitations of evaluation and management by telemedicine. The patient expressed understanding and agreed to proceed.

## 2021-02-02 ENCOUNTER — Telehealth: Payer: Self-pay

## 2021-02-02 DIAGNOSIS — Z7952 Long term (current) use of systemic steroids: Secondary | ICD-10-CM | POA: Diagnosis not present

## 2021-02-02 DIAGNOSIS — M150 Primary generalized (osteo)arthritis: Secondary | ICD-10-CM | POA: Diagnosis not present

## 2021-02-02 DIAGNOSIS — S72002D Fracture of unspecified part of neck of left femur, subsequent encounter for closed fracture with routine healing: Secondary | ICD-10-CM | POA: Diagnosis not present

## 2021-02-02 DIAGNOSIS — Z6823 Body mass index (BMI) 23.0-23.9, adult: Secondary | ICD-10-CM | POA: Diagnosis not present

## 2021-02-02 DIAGNOSIS — M353 Polymyalgia rheumatica: Secondary | ICD-10-CM | POA: Diagnosis not present

## 2021-02-02 LAB — URINE CULTURE
MICRO NUMBER:: 11683123
SPECIMEN QUALITY:: ADEQUATE

## 2021-02-02 NOTE — Telephone Encounter (Signed)
Per her last lab note [name] noted behind me that her Lab orders had not been placed and everyone was out of the office for the day. That was DOCUMENTED WRONG because her lab orders were paced by me on 01/31/2021 as Directed by Dr. Birdie Riddle and they were Released by Jackelyn Poling 3/23 at 2:59pm and sent to [facility]. I spoke with Jackelyn Poling to double check behind myself and she did give the confirmation that everything had been done correctly. If there is any type of confusion with her LAB CULTURE or my Lab notes please send me a teams, e-mail or shoot me a call.

## 2021-02-05 ENCOUNTER — Other Ambulatory Visit: Payer: Self-pay

## 2021-02-05 DIAGNOSIS — I739 Peripheral vascular disease, unspecified: Secondary | ICD-10-CM | POA: Diagnosis not present

## 2021-02-05 DIAGNOSIS — I480 Paroxysmal atrial fibrillation: Secondary | ICD-10-CM | POA: Diagnosis not present

## 2021-02-05 DIAGNOSIS — I5032 Chronic diastolic (congestive) heart failure: Secondary | ICD-10-CM | POA: Diagnosis not present

## 2021-02-05 DIAGNOSIS — G629 Polyneuropathy, unspecified: Secondary | ICD-10-CM | POA: Diagnosis not present

## 2021-02-05 DIAGNOSIS — B965 Pseudomonas (aeruginosa) (mallei) (pseudomallei) as the cause of diseases classified elsewhere: Secondary | ICD-10-CM

## 2021-02-05 DIAGNOSIS — Z48815 Encounter for surgical aftercare following surgery on the digestive system: Secondary | ICD-10-CM | POA: Diagnosis not present

## 2021-02-05 DIAGNOSIS — I110 Hypertensive heart disease with heart failure: Secondary | ICD-10-CM | POA: Diagnosis not present

## 2021-02-05 DIAGNOSIS — M353 Polymyalgia rheumatica: Secondary | ICD-10-CM | POA: Diagnosis not present

## 2021-02-05 DIAGNOSIS — S72002D Fracture of unspecified part of neck of left femur, subsequent encounter for closed fracture with routine healing: Secondary | ICD-10-CM | POA: Diagnosis not present

## 2021-02-05 DIAGNOSIS — I951 Orthostatic hypotension: Secondary | ICD-10-CM | POA: Diagnosis not present

## 2021-02-05 MED ORDER — CIPROFLOXACIN HCL 250 MG PO TABS: 250.0000 mg | ORAL_TABLET | Freq: Two times a day (BID) | ORAL | 0 refills | Status: DC

## 2021-02-16 ENCOUNTER — Ambulatory Visit (INDEPENDENT_AMBULATORY_CARE_PROVIDER_SITE_OTHER): Payer: Medicare PPO

## 2021-02-16 DIAGNOSIS — R350 Frequency of micturition: Secondary | ICD-10-CM | POA: Diagnosis not present

## 2021-02-16 LAB — POCT URINALYSIS DIPSTICK
Bilirubin, UA: NEGATIVE
Blood, UA: NEGATIVE
Glucose, UA: NEGATIVE
Ketones, UA: NEGATIVE
Leukocytes, UA: NEGATIVE
Nitrite, UA: NEGATIVE
Protein, UA: NEGATIVE
Spec Grav, UA: 1.02 (ref 1.010–1.025)
Urobilinogen, UA: 0.2 E.U./dL
pH, UA: 6 (ref 5.0–8.0)

## 2021-02-16 NOTE — Addendum Note (Signed)
Addended by: Lerry Liner on: 02/16/2021 09:54 AM   Modules accepted: Orders

## 2021-02-19 DIAGNOSIS — M79641 Pain in right hand: Secondary | ICD-10-CM | POA: Diagnosis not present

## 2021-02-19 DIAGNOSIS — Z7952 Long term (current) use of systemic steroids: Secondary | ICD-10-CM | POA: Diagnosis not present

## 2021-02-19 DIAGNOSIS — Z6824 Body mass index (BMI) 24.0-24.9, adult: Secondary | ICD-10-CM | POA: Diagnosis not present

## 2021-02-19 DIAGNOSIS — M353 Polymyalgia rheumatica: Secondary | ICD-10-CM | POA: Diagnosis not present

## 2021-02-19 DIAGNOSIS — M150 Primary generalized (osteo)arthritis: Secondary | ICD-10-CM | POA: Diagnosis not present

## 2021-02-19 DIAGNOSIS — S72002D Fracture of unspecified part of neck of left femur, subsequent encounter for closed fracture with routine healing: Secondary | ICD-10-CM | POA: Diagnosis not present

## 2021-02-19 LAB — URINE CULTURE
MICRO NUMBER:: 11750315
SPECIMEN QUALITY:: ADEQUATE

## 2021-02-20 DIAGNOSIS — Z9181 History of falling: Secondary | ICD-10-CM | POA: Diagnosis not present

## 2021-02-20 DIAGNOSIS — R2689 Other abnormalities of gait and mobility: Secondary | ICD-10-CM | POA: Diagnosis not present

## 2021-02-20 DIAGNOSIS — S7292XA Unspecified fracture of left femur, initial encounter for closed fracture: Secondary | ICD-10-CM | POA: Diagnosis not present

## 2021-02-20 DIAGNOSIS — M6281 Muscle weakness (generalized): Secondary | ICD-10-CM | POA: Diagnosis not present

## 2021-02-20 DIAGNOSIS — S7292XS Unspecified fracture of left femur, sequela: Secondary | ICD-10-CM | POA: Diagnosis not present

## 2021-02-20 DIAGNOSIS — I10 Essential (primary) hypertension: Secondary | ICD-10-CM | POA: Diagnosis not present

## 2021-02-28 ENCOUNTER — Telehealth: Payer: Self-pay | Admitting: Family Medicine

## 2021-02-28 NOTE — Telephone Encounter (Signed)
Patient needs sucralfate - [facility] [place]

## 2021-03-01 ENCOUNTER — Other Ambulatory Visit: Payer: Self-pay

## 2021-03-01 DIAGNOSIS — S7292XS Unspecified fracture of left femur, sequela: Secondary | ICD-10-CM | POA: Diagnosis not present

## 2021-03-01 DIAGNOSIS — M6281 Muscle weakness (generalized): Secondary | ICD-10-CM | POA: Diagnosis not present

## 2021-03-01 DIAGNOSIS — S7292XA Unspecified fracture of left femur, initial encounter for closed fracture: Secondary | ICD-10-CM | POA: Diagnosis not present

## 2021-03-01 DIAGNOSIS — R2689 Other abnormalities of gait and mobility: Secondary | ICD-10-CM | POA: Diagnosis not present

## 2021-03-01 DIAGNOSIS — I10 Essential (primary) hypertension: Secondary | ICD-10-CM | POA: Diagnosis not present

## 2021-03-01 DIAGNOSIS — Z9181 History of falling: Secondary | ICD-10-CM | POA: Diagnosis not present

## 2021-03-01 MED ORDER — SUCRALFATE 1 G PO TABS: 1.0000 g | ORAL_TABLET | Freq: Three times a day (TID) | ORAL | 0 refills | Status: DC

## 2021-03-01 NOTE — Telephone Encounter (Signed)
Medication sent to patient's pharmacy.

## 2021-03-06 DIAGNOSIS — M6281 Muscle weakness (generalized): Secondary | ICD-10-CM | POA: Diagnosis not present

## 2021-03-06 DIAGNOSIS — R2689 Other abnormalities of gait and mobility: Secondary | ICD-10-CM | POA: Diagnosis not present

## 2021-03-06 DIAGNOSIS — R296 Repeated falls: Secondary | ICD-10-CM | POA: Diagnosis not present

## 2021-03-13 DIAGNOSIS — R296 Repeated falls: Secondary | ICD-10-CM | POA: Diagnosis not present

## 2021-03-13 DIAGNOSIS — R2689 Other abnormalities of gait and mobility: Secondary | ICD-10-CM | POA: Diagnosis not present

## 2021-03-13 DIAGNOSIS — M6281 Muscle weakness (generalized): Secondary | ICD-10-CM | POA: Diagnosis not present

## 2021-03-15 DIAGNOSIS — M6281 Muscle weakness (generalized): Secondary | ICD-10-CM | POA: Diagnosis not present

## 2021-03-15 DIAGNOSIS — R296 Repeated falls: Secondary | ICD-10-CM | POA: Diagnosis not present

## 2021-03-15 DIAGNOSIS — R2689 Other abnormalities of gait and mobility: Secondary | ICD-10-CM | POA: Diagnosis not present

## 2021-03-20 DIAGNOSIS — R296 Repeated falls: Secondary | ICD-10-CM | POA: Diagnosis not present

## 2021-03-20 DIAGNOSIS — R2689 Other abnormalities of gait and mobility: Secondary | ICD-10-CM | POA: Diagnosis not present

## 2021-03-20 DIAGNOSIS — M6281 Muscle weakness (generalized): Secondary | ICD-10-CM | POA: Diagnosis not present

## 2021-03-22 DIAGNOSIS — I10 Essential (primary) hypertension: Secondary | ICD-10-CM | POA: Diagnosis not present

## 2021-03-22 DIAGNOSIS — R2689 Other abnormalities of gait and mobility: Secondary | ICD-10-CM | POA: Diagnosis not present

## 2021-03-22 DIAGNOSIS — M6281 Muscle weakness (generalized): Secondary | ICD-10-CM | POA: Diagnosis not present

## 2021-03-22 DIAGNOSIS — S7292XS Unspecified fracture of left femur, sequela: Secondary | ICD-10-CM | POA: Diagnosis not present

## 2021-03-22 DIAGNOSIS — S7292XA Unspecified fracture of left femur, initial encounter for closed fracture: Secondary | ICD-10-CM | POA: Diagnosis not present

## 2021-03-22 DIAGNOSIS — Z9181 History of falling: Secondary | ICD-10-CM | POA: Diagnosis not present

## 2021-03-23 DIAGNOSIS — M6281 Muscle weakness (generalized): Secondary | ICD-10-CM | POA: Diagnosis not present

## 2021-03-23 DIAGNOSIS — R296 Repeated falls: Secondary | ICD-10-CM | POA: Diagnosis not present

## 2021-03-23 DIAGNOSIS — R2689 Other abnormalities of gait and mobility: Secondary | ICD-10-CM | POA: Diagnosis not present

## 2021-03-27 DIAGNOSIS — R2689 Other abnormalities of gait and mobility: Secondary | ICD-10-CM | POA: Diagnosis not present

## 2021-03-27 DIAGNOSIS — R296 Repeated falls: Secondary | ICD-10-CM | POA: Diagnosis not present

## 2021-03-27 DIAGNOSIS — M6281 Muscle weakness (generalized): Secondary | ICD-10-CM | POA: Diagnosis not present

## 2021-03-28 ENCOUNTER — Ambulatory Visit: Payer: Medicare PPO | Admitting: Family Medicine

## 2021-03-30 DIAGNOSIS — R296 Repeated falls: Secondary | ICD-10-CM | POA: Diagnosis not present

## 2021-03-30 DIAGNOSIS — R2689 Other abnormalities of gait and mobility: Secondary | ICD-10-CM | POA: Diagnosis not present

## 2021-03-30 DIAGNOSIS — M6281 Muscle weakness (generalized): Secondary | ICD-10-CM | POA: Diagnosis not present

## 2021-03-31 DIAGNOSIS — S7292XS Unspecified fracture of left femur, sequela: Secondary | ICD-10-CM | POA: Diagnosis not present

## 2021-03-31 DIAGNOSIS — R2689 Other abnormalities of gait and mobility: Secondary | ICD-10-CM | POA: Diagnosis not present

## 2021-03-31 DIAGNOSIS — Z9181 History of falling: Secondary | ICD-10-CM | POA: Diagnosis not present

## 2021-03-31 DIAGNOSIS — I10 Essential (primary) hypertension: Secondary | ICD-10-CM | POA: Diagnosis not present

## 2021-03-31 DIAGNOSIS — M6281 Muscle weakness (generalized): Secondary | ICD-10-CM | POA: Diagnosis not present

## 2021-03-31 DIAGNOSIS — S7292XA Unspecified fracture of left femur, initial encounter for closed fracture: Secondary | ICD-10-CM | POA: Diagnosis not present

## 2021-04-03 DIAGNOSIS — Z6824 Body mass index (BMI) 24.0-24.9, adult: Secondary | ICD-10-CM | POA: Diagnosis not present

## 2021-04-03 DIAGNOSIS — Z7952 Long term (current) use of systemic steroids: Secondary | ICD-10-CM | POA: Diagnosis not present

## 2021-04-03 DIAGNOSIS — M150 Primary generalized (osteo)arthritis: Secondary | ICD-10-CM | POA: Diagnosis not present

## 2021-04-03 DIAGNOSIS — M79642 Pain in left hand: Secondary | ICD-10-CM | POA: Diagnosis not present

## 2021-04-03 DIAGNOSIS — M79641 Pain in right hand: Secondary | ICD-10-CM | POA: Diagnosis not present

## 2021-04-03 DIAGNOSIS — M353 Polymyalgia rheumatica: Secondary | ICD-10-CM | POA: Diagnosis not present

## 2021-04-03 DIAGNOSIS — G5601 Carpal tunnel syndrome, right upper limb: Secondary | ICD-10-CM | POA: Diagnosis not present

## 2021-04-06 DIAGNOSIS — R296 Repeated falls: Secondary | ICD-10-CM | POA: Diagnosis not present

## 2021-04-06 DIAGNOSIS — M6281 Muscle weakness (generalized): Secondary | ICD-10-CM | POA: Diagnosis not present

## 2021-04-06 DIAGNOSIS — R2689 Other abnormalities of gait and mobility: Secondary | ICD-10-CM | POA: Diagnosis not present

## 2021-04-12 ENCOUNTER — Other Ambulatory Visit: Payer: Self-pay | Admitting: Family Medicine

## 2021-04-12 DIAGNOSIS — K219 Gastro-esophageal reflux disease without esophagitis: Secondary | ICD-10-CM

## 2021-04-13 DIAGNOSIS — R296 Repeated falls: Secondary | ICD-10-CM | POA: Diagnosis not present

## 2021-04-13 DIAGNOSIS — R2689 Other abnormalities of gait and mobility: Secondary | ICD-10-CM | POA: Diagnosis not present

## 2021-04-13 DIAGNOSIS — M6281 Muscle weakness (generalized): Secondary | ICD-10-CM | POA: Diagnosis not present

## 2021-04-19 ENCOUNTER — Other Ambulatory Visit: Payer: Self-pay

## 2021-04-19 ENCOUNTER — Ambulatory Visit: Payer: Medicare PPO | Admitting: Family Medicine

## 2021-04-19 ENCOUNTER — Encounter: Payer: Self-pay | Admitting: Family Medicine

## 2021-04-19 VITALS — BP 141/80 | HR 65 | Temp 97.5°F | Resp 17 | Ht 66.0 in | Wt 147.0 lb

## 2021-04-19 DIAGNOSIS — E781 Pure hyperglyceridemia: Secondary | ICD-10-CM | POA: Diagnosis not present

## 2021-04-19 DIAGNOSIS — E039 Hypothyroidism, unspecified: Secondary | ICD-10-CM | POA: Diagnosis not present

## 2021-04-19 DIAGNOSIS — R200 Anesthesia of skin: Secondary | ICD-10-CM

## 2021-04-19 DIAGNOSIS — I10 Essential (primary) hypertension: Secondary | ICD-10-CM

## 2021-04-19 LAB — CBC WITH DIFFERENTIAL/PLATELET
Basophils Absolute: 0 10*3/uL (ref 0.0–0.1)
Basophils Relative: 0.3 % (ref 0.0–3.0)
Eosinophils Absolute: 0.3 10*3/uL (ref 0.0–0.7)
Eosinophils Relative: 2.4 % (ref 0.0–5.0)
HCT: 35.5 % — ABNORMAL LOW (ref 36.0–46.0)
Hemoglobin: 11.2 g/dL — ABNORMAL LOW (ref 12.0–15.0)
Lymphocytes Relative: 24.5 % (ref 12.0–46.0)
Lymphs Abs: 3.3 10*3/uL (ref 0.7–4.0)
MCHC: 31.6 g/dL (ref 30.0–36.0)
MCV: 85.4 fl (ref 78.0–100.0)
Monocytes Absolute: 1.2 10*3/uL — ABNORMAL HIGH (ref 0.1–1.0)
Monocytes Relative: 9 % (ref 3.0–12.0)
Neutro Abs: 8.5 10*3/uL — ABNORMAL HIGH (ref 1.4–7.7)
Neutrophils Relative %: 63.8 % (ref 43.0–77.0)
Platelets: 375 10*3/uL (ref 150.0–400.0)
RBC: 4.15 Mil/uL (ref 3.87–5.11)
RDW: 15.9 % — ABNORMAL HIGH (ref 11.5–15.5)
WBC: 13.3 10*3/uL — ABNORMAL HIGH (ref 4.0–10.5)

## 2021-04-19 LAB — HEPATIC FUNCTION PANEL
ALT: 11 U/L (ref 0–35)
AST: 12 U/L (ref 0–37)
Albumin: 3.7 g/dL (ref 3.5–5.2)
Alkaline Phosphatase: 89 U/L (ref 39–117)
Bilirubin, Direct: 0.1 mg/dL (ref 0.0–0.3)
Total Bilirubin: 0.5 mg/dL (ref 0.2–1.2)
Total Protein: 6.3 g/dL (ref 6.0–8.3)

## 2021-04-19 LAB — LIPID PANEL
Cholesterol: 164 mg/dL (ref 0–200)
HDL: 58.5 mg/dL (ref >39.00)
NonHDL: 105.28
Total CHOL/HDL Ratio: 3
Triglycerides: 213 mg/dL — ABNORMAL HIGH (ref 0.0–149.0)
VLDL: 42.6 mg/dL — ABNORMAL HIGH (ref 0.0–40.0)

## 2021-04-19 LAB — TSH: TSH: 0.7 u[IU]/mL (ref 0.35–4.50)

## 2021-04-19 LAB — BASIC METABOLIC PANEL
BUN: 18 mg/dL (ref 6–23)
CO2: 29 mEq/L (ref 19–32)
Calcium: 9.2 mg/dL (ref 8.4–10.5)
Chloride: 105 mEq/L (ref 96–112)
Creatinine, Ser: 0.9 mg/dL (ref 0.40–1.20)
GFR: 54.12 mL/min — ABNORMAL LOW (ref >60.00)
Glucose, Bld: 83 mg/dL (ref 70–99)
Potassium: 4.1 mEq/L (ref 3.5–5.1)
Sodium: 142 mEq/L (ref 135–145)

## 2021-04-19 LAB — LDL CHOLESTEROL, DIRECT: Direct LDL: 68 mg/dL

## 2021-04-19 IMAGING — CT CT HIP*L* W/CM
2 of 3 series · 12 of 46 positions shown, 14 images · IV contrast (agent unspecified)
Comparison: Same-day radiograph, radiograph and CT pelvis
05/14/2020, CT abdomen and pelvis 01/11/2020

CONTRAST:  100mL OMNIPAQUE IOHEXOL 300 MG/ML  SOLN
COMPARISON: Same-day radiograph, radiograph and CT pelvis
05/14/2020, CT abdomen and pelvis 01/11/2020

CONTRAST:  100mL OMNIPAQUE IOHEXOL 300 MG/ML  SOLN

Addendum:
CLINICAL DATA: Soft tissue mass, swelling

EXAM:
CT OF THE LOWER LEFT EXTREMITY WITH CONTRAST
TECHNIQUE: Multidetector CT imaging of the lower left extremity was performed
according to the standard protocol following intravenous contrast
administration.

[Series 2: axial st · axial · 0.54mm/px · z∈[-723,-533]mm · 9 of 109 slices shown, 11 images]
[im 7/109  soft-tissue]
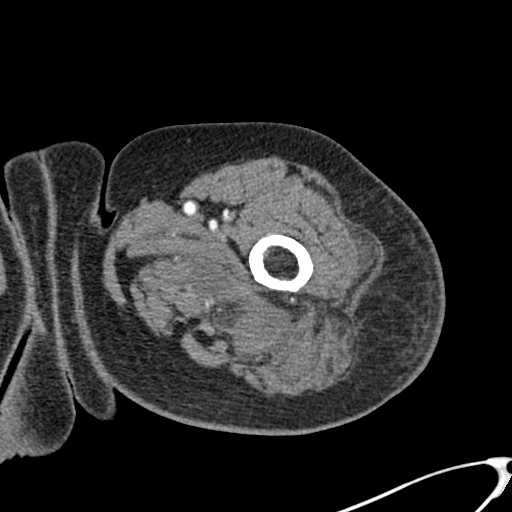
[im 7/109  bone]
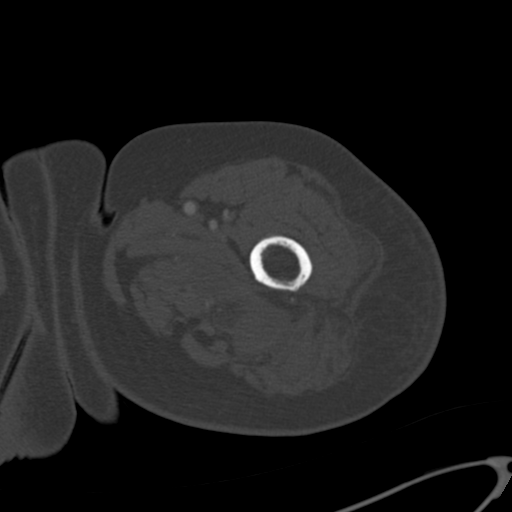
[im 21/109  soft-tissue]
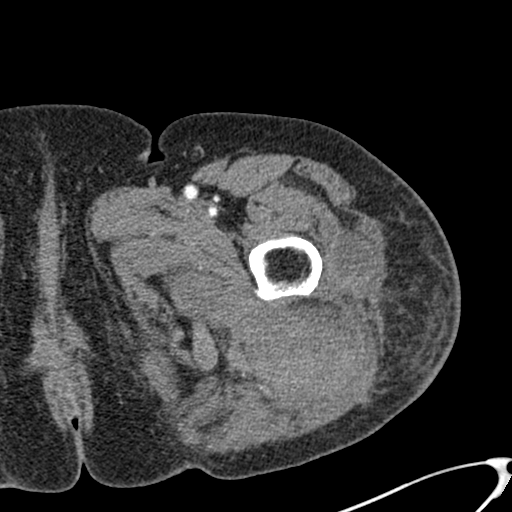
[im 32/109  soft-tissue]
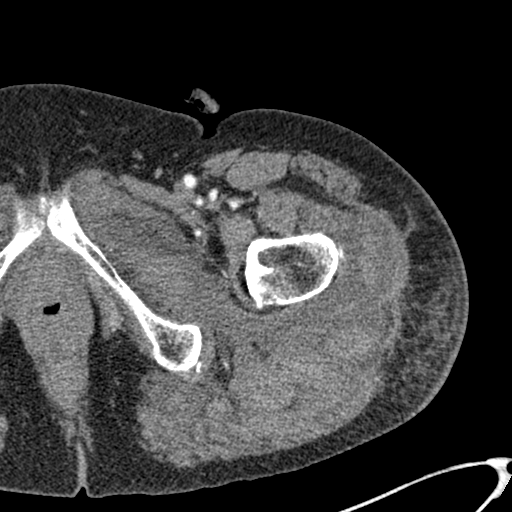
[im 42/109  soft-tissue]
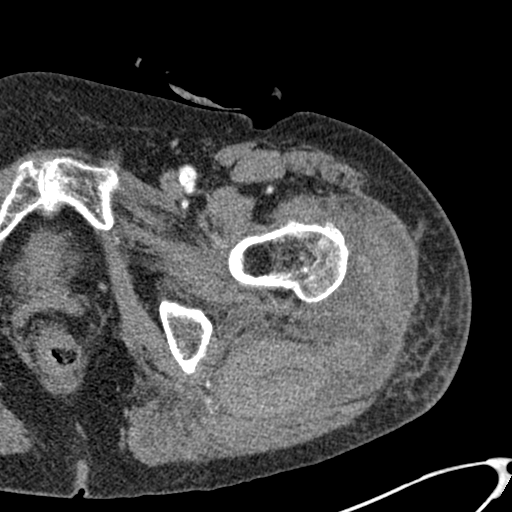
[im 56/109  soft-tissue]
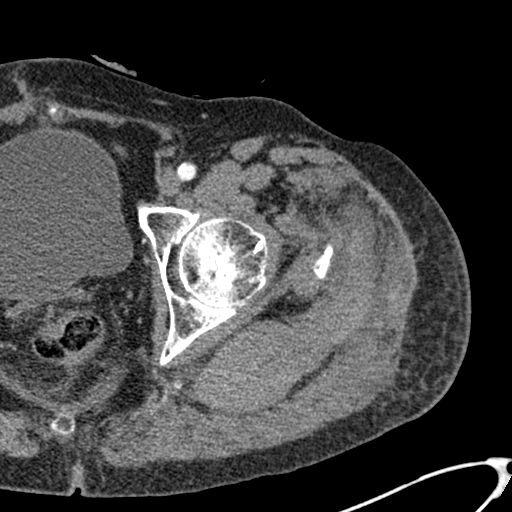
[im 67/109  soft-tissue]
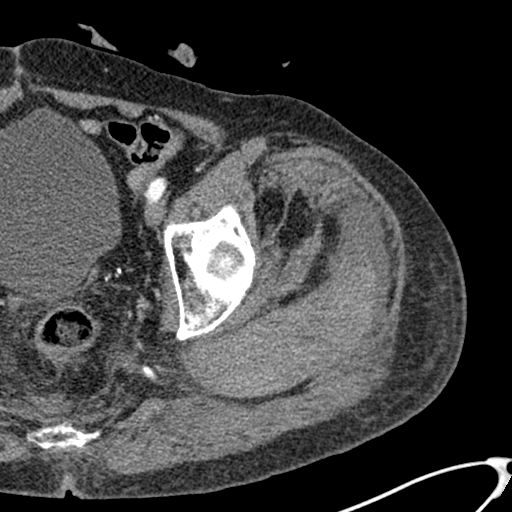
[im 77/109  soft-tissue]
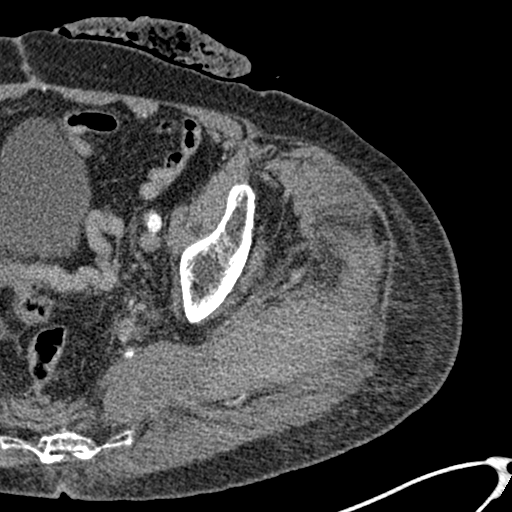
[im 91/109  soft-tissue]
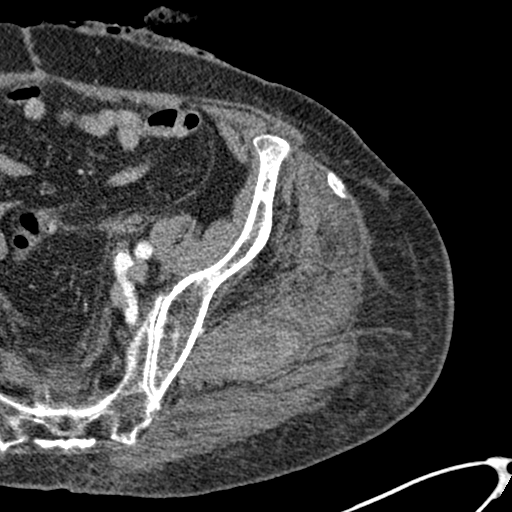
[im 102/109  soft-tissue]
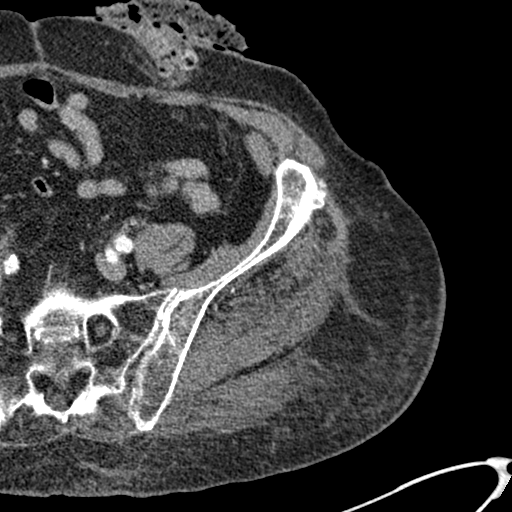
[im 102/109  bone]
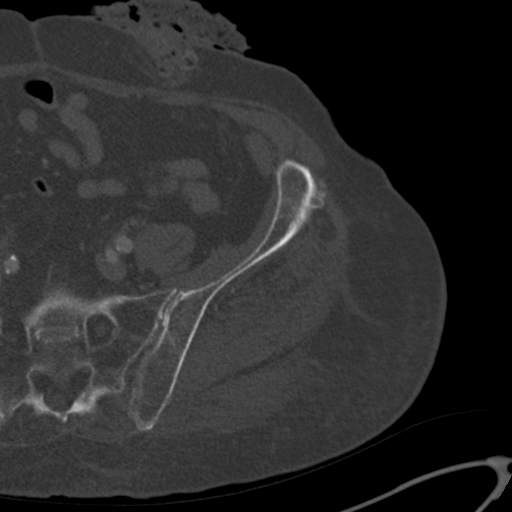

[Series 6: coronal st · coronal · 0.42mm/px · 3 of 126 slices shown]
[im 42/126  soft-tissue]
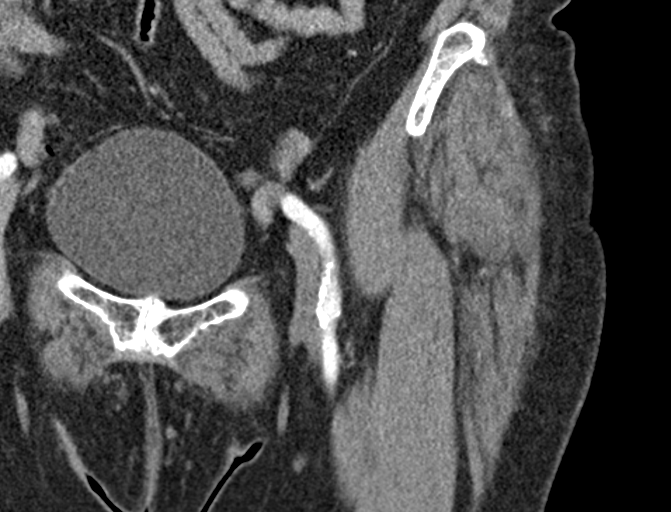
[im 56/126  soft-tissue]
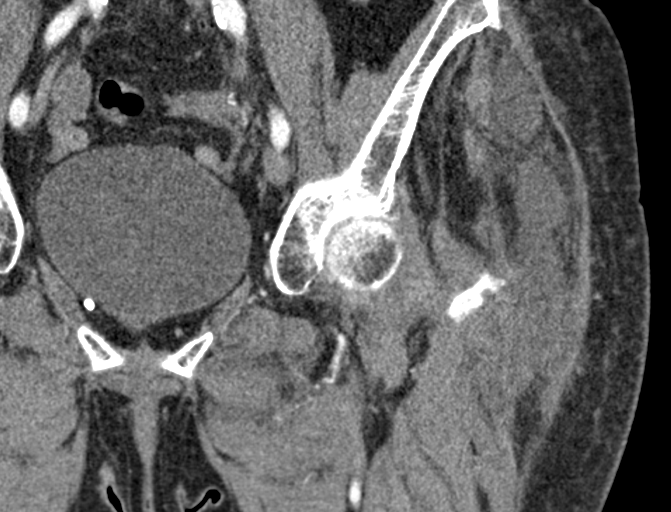
[im 70/126  soft-tissue]
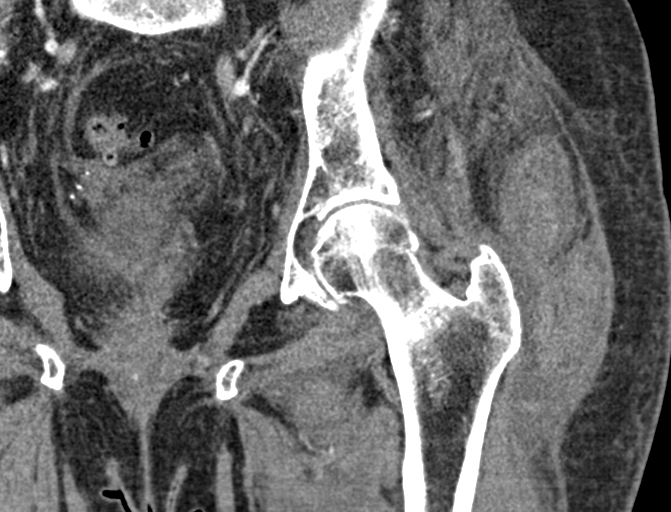

[12 of 46 positions shown; findings below may reference images not displayed]

FINDINGS: Bones/Joint/Cartilage

The osseous structures appear diffusely demineralized which may
limit detection of small or nondisplaced fractures.

Included portions of the bony pelvis are intact and congruent. The
proximal femur is intact and normally located.

Degenerative change of the left SI joint and symphysis pubis.
Moderate degenerative features of the left hip with periacetabular
spurring. Additional enthesopathic spurring noted upon the iliac
crest, ischial tuberosities and greater trochanter of the left
femur. Enthesopathic mineralization within the soft tissue seen
along the anterior left hip, unchanged from prior. No evidence of
acute avulsion.

Benign bone island in the supra-acetabular femoral neck.
Enthesopathic changes

No sizable left hip effusion.

Ligaments

Suboptimally assessed by CT.

Muscles and Tendons

Multiple intramuscular hemorrhages throughout the left hip
musculature. These involve the gluteus maximus, inferior gluteus
medius, piriformis, proximal vastus lateralis, proximal adductor
compartment and quadratus femoris musculature. The extent of this
hemorrhagic change results in some increasing lateral bowing of the
iliotibial band with additional overlying contusive changes.
Furthermore, hemorrhage along the ileus psoas does track medially to
the sacral insertion site and there is increasing presacral fat
stranding which is likely reactive.

Soft tissues

Lateral hip soft tissue swelling and contusive changes with the
muscular hemorrhages, as above. Presacral fat stranding, increased
from prior likely attributable to the piriformis muscle hemorrhage.
There is a left lower quadrant colostomy noted. Hartmann's pouch in
the pelvis with few noninflamed colonic diverticula. Postsurgical
changes of the low anterior abdomen. Prior hysterectomy. Extensive
atherosclerotic calcification.
IMPRESSION: 1. Multiple large intramuscular hemorrhages throughout the left hip
musculature, involving the gluteus maximus, inferior gluteus medius,
inferior gluteus medius, proximal vastus lateralis, proximal
adductor compartment and quadratus femoris musculature. The extent
of this hemorrhagic change results in some increasing lateral bowing
of the iliotibial band with additional overlying contusive changes.
2. Piriformis hemorrhage does track medially to the sacral insertion
site and there is increasing presacral fat stranding which is likely
reactive.
3. No acute fracture or traumatic malalignment of the visualized
bony pelvis.
4. Moderate degenerative features of the left hip with
periacetabular spurring.

ADDENDUM:
No sites of active contrast extravasation identified within the
intramuscular hemorrhage. However the mixed attenuation of these
collections is suggestive of mixed age blood products including
acute on more subacute hemorrhage. Can reflect sequela complex
high-grade tearing, though underlying musculotendinous integrity is
difficult to fully assess on CT and in the setting of such extensive
hemorrhages.

Initial findings and addendum were discussed via telephone on
05/19/2020 at [DATE] to provider DJUKANOVIC BENIL , who verbally
acknowledged these results.

*** End of Addendum ***
FINDINGS: Bones/Joint/Cartilage

The osseous structures appear diffusely demineralized which may
limit detection of small or nondisplaced fractures.

Included portions of the bony pelvis are intact and congruent. The
proximal femur is intact and normally located.

Degenerative change of the left SI joint and symphysis pubis.
Moderate degenerative features of the left hip with periacetabular
spurring. Additional enthesopathic spurring noted upon the iliac
crest, ischial tuberosities and greater trochanter of the left
femur. Enthesopathic mineralization within the soft tissue seen
along the anterior left hip, unchanged from prior. No evidence of
acute avulsion.

Benign bone island in the supra-acetabular femoral neck.
Enthesopathic changes

No sizable left hip effusion.

Ligaments

Suboptimally assessed by CT.

Muscles and Tendons

Multiple intramuscular hemorrhages throughout the left hip
musculature. These involve the gluteus maximus, inferior gluteus
medius, piriformis, proximal vastus lateralis, proximal adductor
compartment and quadratus femoris musculature. The extent of this
hemorrhagic change results in some increasing lateral bowing of the
iliotibial band with additional overlying contusive changes.
Furthermore, hemorrhage along the ileus psoas does track medially to
the sacral insertion site and there is increasing presacral fat
stranding which is likely reactive.

Soft tissues

Lateral hip soft tissue swelling and contusive changes with the
muscular hemorrhages, as above. Presacral fat stranding, increased
from prior likely attributable to the piriformis muscle hemorrhage.
There is a left lower quadrant colostomy noted. Hartmann's pouch in
the pelvis with few noninflamed colonic diverticula. Postsurgical
changes of the low anterior abdomen. Prior hysterectomy. Extensive
atherosclerotic calcification.
IMPRESSION: 1. Multiple large intramuscular hemorrhages throughout the left hip
musculature, involving the gluteus maximus, inferior gluteus medius,
inferior gluteus medius, proximal vastus lateralis, proximal
adductor compartment and quadratus femoris musculature. The extent
of this hemorrhagic change results in some increasing lateral bowing
of the iliotibial band with additional overlying contusive changes.
2. Piriformis hemorrhage does track medially to the sacral insertion
site and there is increasing presacral fat stranding which is likely
reactive.
3. No acute fracture or traumatic malalignment of the visualized
bony pelvis.
4. Moderate degenerative features of the left hip with
periacetabular spurring.

## 2021-04-19 IMAGING — CR DG FEMUR 2+V*L*
4 series · 4 of 4 positions shown · non-contrast
Comparison: None.

CLINICAL DATA: Fall with hip pain

EXAM:
LEFT FEMUR 2 VIEWS

[x femur proximal ap left]
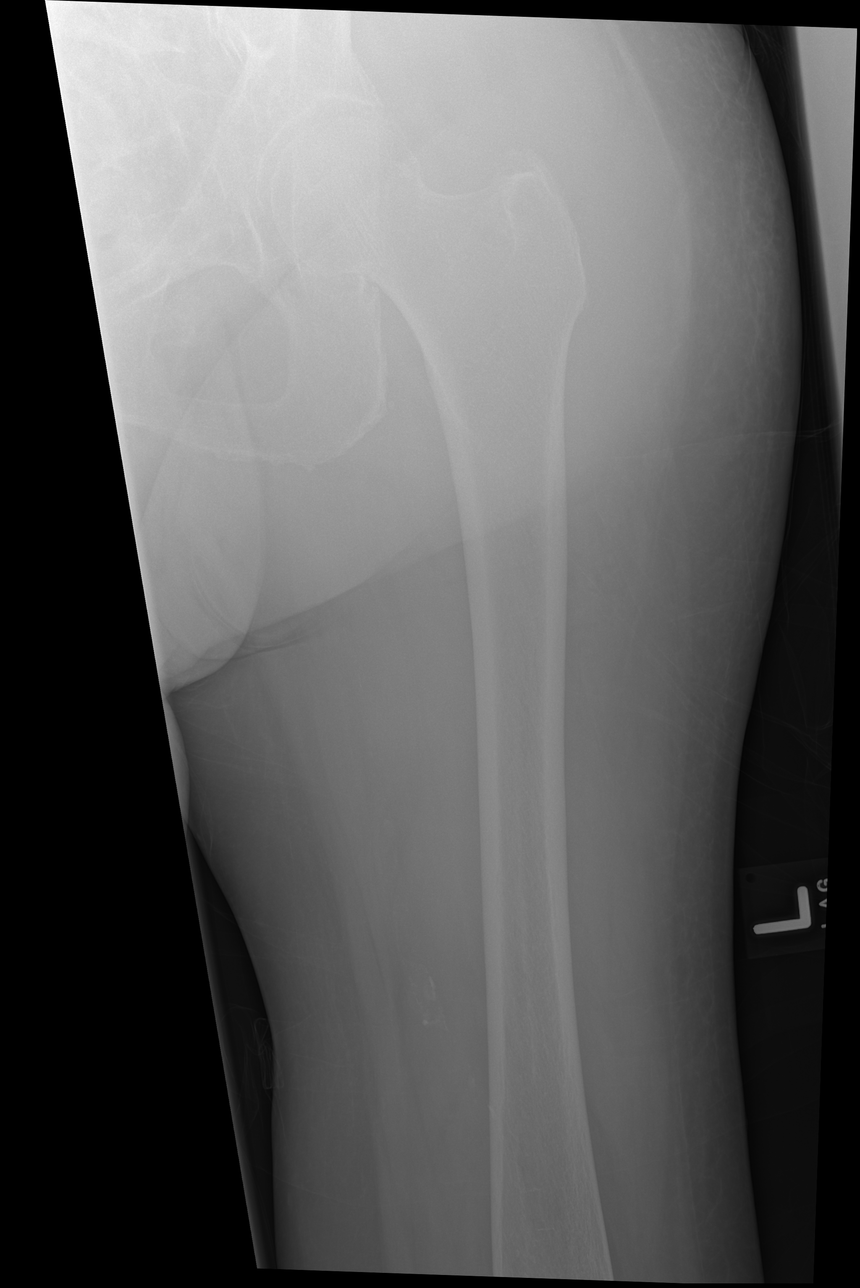

[x femur distal ap left]
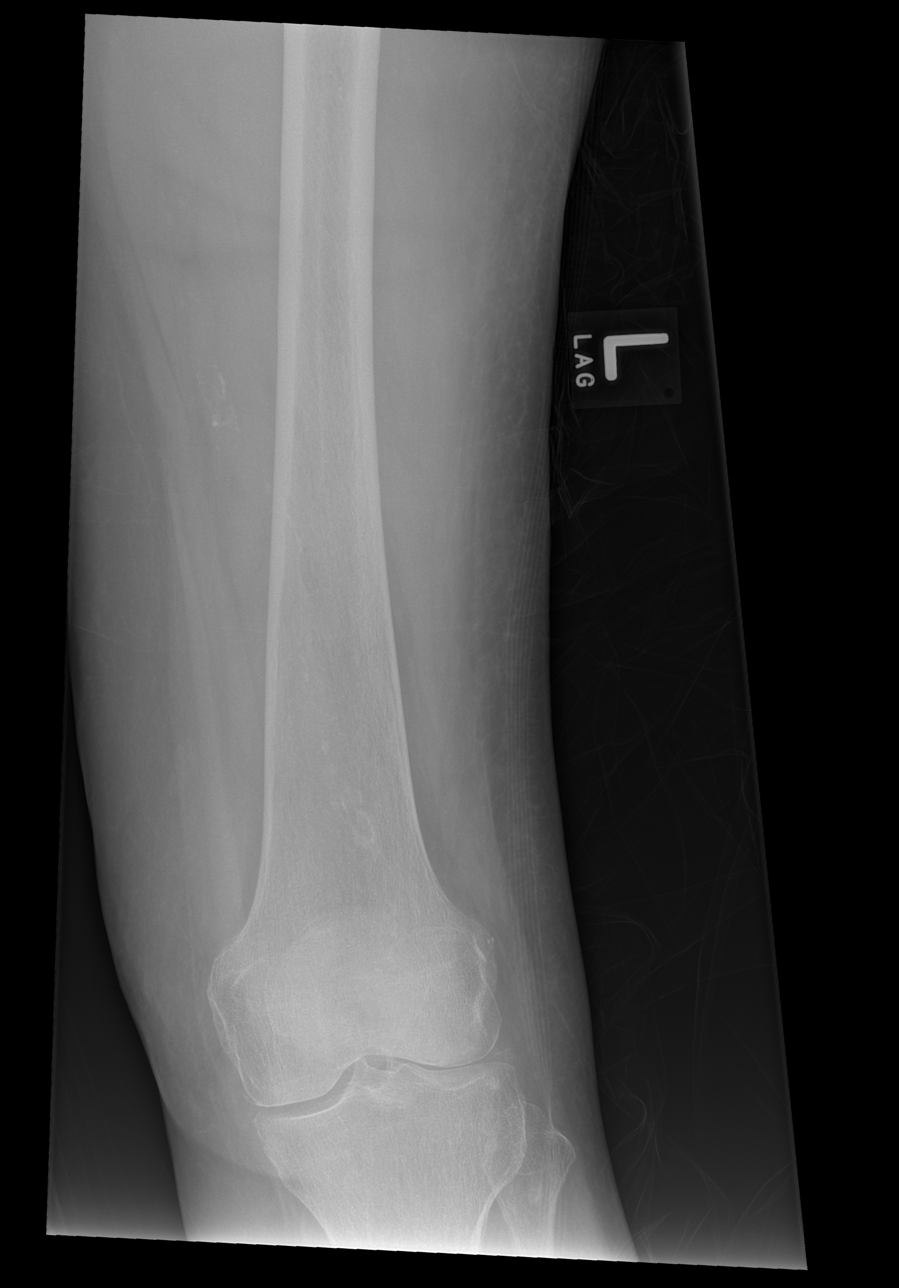

[w femur distal lat left]
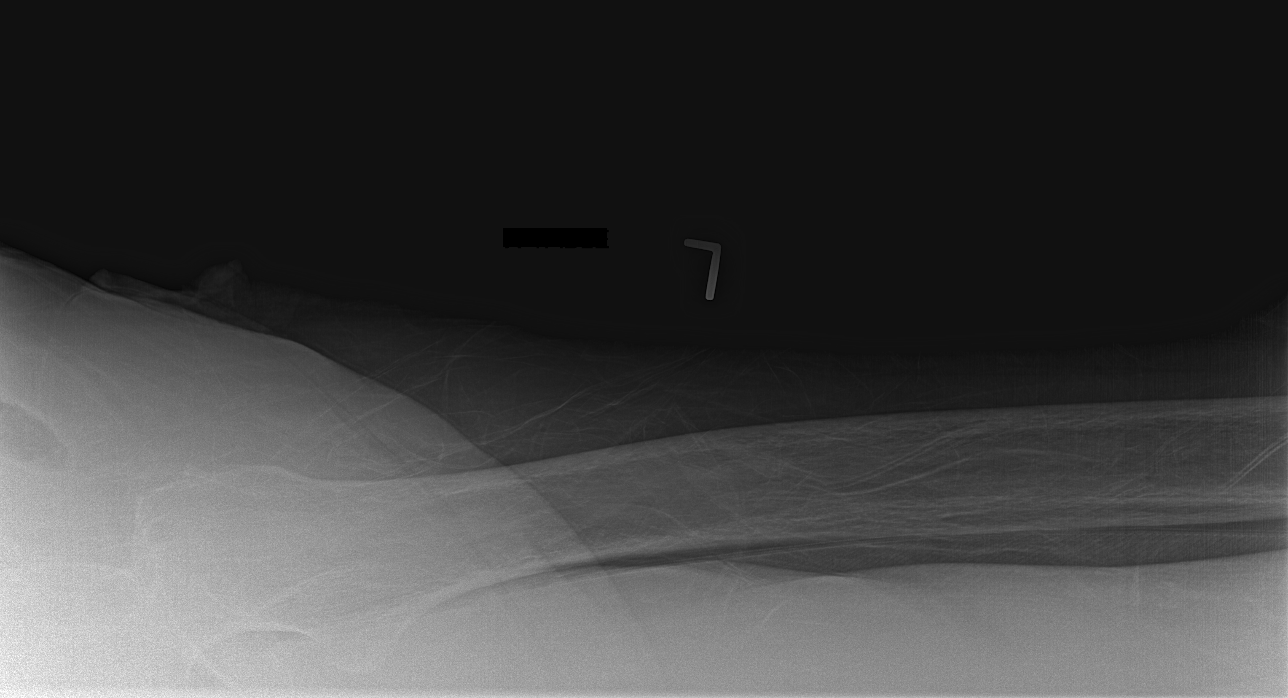

[x femur distal lat left]
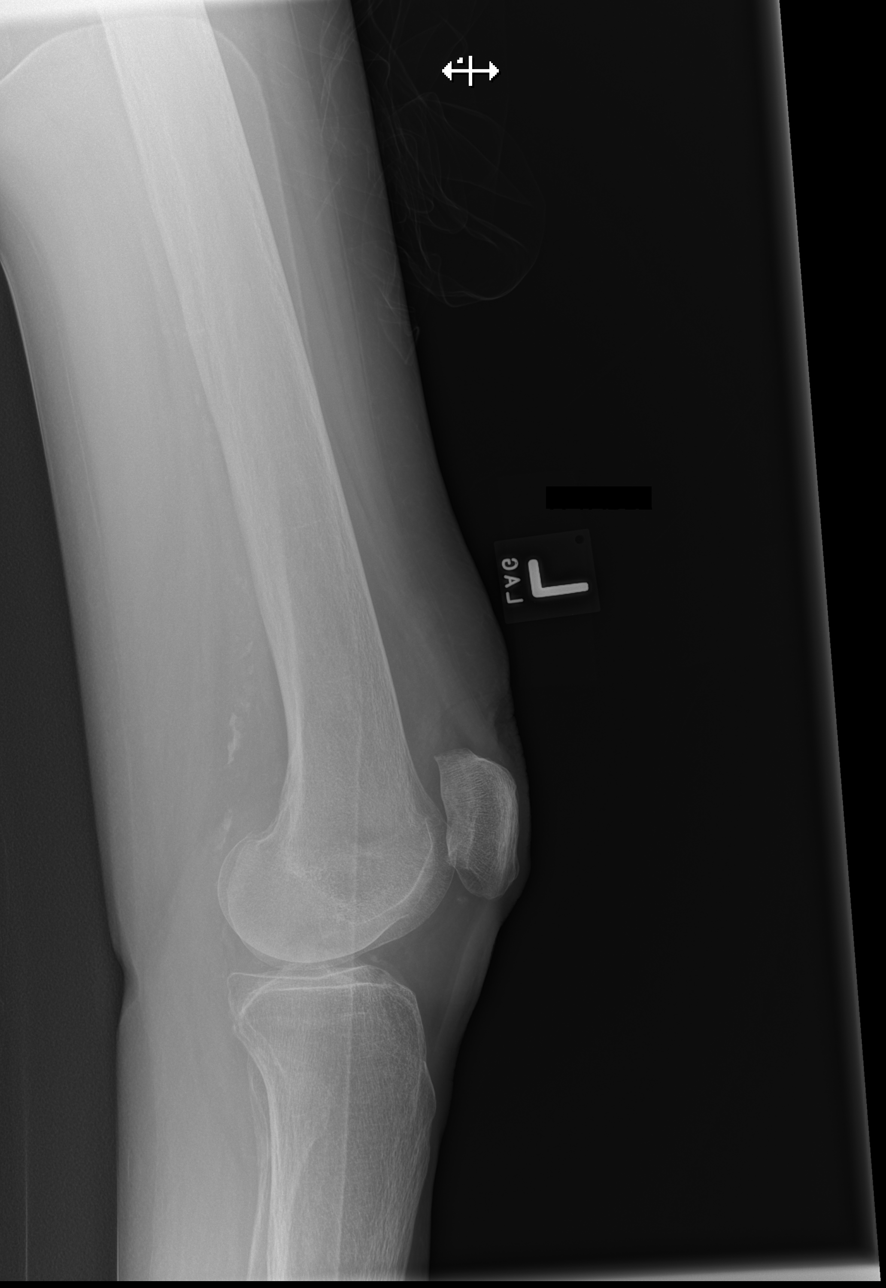

[4 of 4 positions shown; findings below may reference images not displayed]

FINDINGS: There is no evidence of fracture or other focal bone lesions. Soft
tissues are unremarkable.
IMPRESSION: Negative.

## 2021-04-19 MED ORDER — GABAPENTIN 300 MG PO CAPS: 300.0000 mg | ORAL_CAPSULE | Freq: Every day | ORAL | 1 refills | Status: DC

## 2021-04-19 MED ORDER — GABAPENTIN 100 MG PO CAPS: 100.0000 mg | ORAL_CAPSULE | Freq: Two times a day (BID) | ORAL | 1 refills | Status: DC

## 2021-04-19 NOTE — Patient Instructions (Signed)
Schedule your complete physical in 6 months We'll notify you of your lab results and make any changes if needed Continue the Gabapentin 300mg  nightly and add Gabapentin 100mg  1-2x during the day Call with any questions or concerns Stay Safe!  Stay Healthy! Have a great summer!!!

## 2021-04-19 NOTE — Progress Notes (Signed)
   Subjective:    Patient ID: [name], female    DOB: [birth year], 87 y.o.   MRN: [id number]  HPI HTN- chronic problem, on Diltiazem 60mg  BID, Losartan 100mg  daily, Nadolol 40mg  daily w/ good control.  Pt reports BP has been pretty stable.  No CP, SOB, HAs, visual changes, edema  Hypothyroid- chronic problem, on Levothyroxine 72mcg daily.  No recent change in energy level.  No changes to skin/hair/nails.  Hypertriglyceridemia- pt's last trigs 225.  Not currently on medication.    L leg numbness- currently on Gabapentin 300mg  nightly.  Asking if she can increase or change her dose.  Numbness is worse and she now complains the leg is cold (not to touch)   Review of Systems For ROS see HPI   This visit occurred during the SARS-CoV-2 public health emergency.  Safety protocols were in place, including screening questions prior to the visit, additional usage of staff PPE, and extensive cleaning of exam room while observing appropriate contact time as indicated for disinfecting solutions.      Objective:   Physical Exam Constitutional:      General: She is not in acute distress.    Appearance: She is well-developed.  HENT:     Head: Normocephalic and atraumatic.  Eyes:     Extraocular Movements: Extraocular movements intact.     Conjunctiva/sclera: Conjunctivae normal.     Pupils: Pupils are equal, round, and reactive to light.  Neck:     Thyroid: No thyromegaly.  Cardiovascular:     Rate and Rhythm: Normal rate and regular rhythm.     Pulses: Normal pulses.     Heart sounds: Normal heart sounds. No murmur heard. Pulmonary:     Effort: Pulmonary effort is normal. No respiratory distress.     Breath sounds: Normal breath sounds.  Abdominal:     General: There is no distension.     Palpations: Abdomen is soft.     Tenderness: There is no abdominal tenderness.  Musculoskeletal:     Cervical back: Normal range of motion and neck supple.     Right lower leg: No edema.     Left  lower leg: No edema.  Lymphadenopathy:     Cervical: No cervical adenopathy.  Skin:    General: Skin is warm and dry.  Neurological:     Mental Status: She is alert and oriented to person, place, and time.  Psychiatric:        Behavior: Behavior normal.          Assessment & Plan:

## 2021-04-20 DIAGNOSIS — R296 Repeated falls: Secondary | ICD-10-CM | POA: Diagnosis not present

## 2021-04-20 DIAGNOSIS — R2689 Other abnormalities of gait and mobility: Secondary | ICD-10-CM | POA: Diagnosis not present

## 2021-04-20 DIAGNOSIS — M6281 Muscle weakness (generalized): Secondary | ICD-10-CM | POA: Diagnosis not present

## 2021-04-22 DIAGNOSIS — I10 Essential (primary) hypertension: Secondary | ICD-10-CM | POA: Diagnosis not present

## 2021-04-22 DIAGNOSIS — S7292XS Unspecified fracture of left femur, sequela: Secondary | ICD-10-CM | POA: Diagnosis not present

## 2021-04-22 DIAGNOSIS — Z9181 History of falling: Secondary | ICD-10-CM | POA: Diagnosis not present

## 2021-04-22 DIAGNOSIS — S7292XA Unspecified fracture of left femur, initial encounter for closed fracture: Secondary | ICD-10-CM | POA: Diagnosis not present

## 2021-04-22 DIAGNOSIS — R2689 Other abnormalities of gait and mobility: Secondary | ICD-10-CM | POA: Diagnosis not present

## 2021-04-22 DIAGNOSIS — M6281 Muscle weakness (generalized): Secondary | ICD-10-CM | POA: Diagnosis not present

## 2021-04-22 NOTE — Assessment & Plan Note (Signed)
Chronic problem for pt.  Will add additional gabapentin for her to take during the day to improve sxs.  Pt expressed understanding and is in agreement w/ plan.

## 2021-04-22 NOTE — Assessment & Plan Note (Signed)
Ongoing issue for pt.  Last Trigs 225.  Not currently on medication.  Check labs and determine if meds needed

## 2021-04-22 NOTE — Assessment & Plan Note (Signed)
Chronic problem.  Adequate control on Dilt, Losartan, Nadolol- especially given her advanced age.  Currently asymptomatic.  Check labs.  No anticipated med changes.

## 2021-04-22 NOTE — Assessment & Plan Note (Signed)
Chronic problem.  Currently asymptomatic.  Check labs.  Adjust meds prn  

## 2021-04-24 DIAGNOSIS — R2689 Other abnormalities of gait and mobility: Secondary | ICD-10-CM | POA: Diagnosis not present

## 2021-04-24 DIAGNOSIS — G5601 Carpal tunnel syndrome, right upper limb: Secondary | ICD-10-CM | POA: Diagnosis not present

## 2021-04-24 DIAGNOSIS — M6281 Muscle weakness (generalized): Secondary | ICD-10-CM | POA: Diagnosis not present

## 2021-04-24 DIAGNOSIS — R296 Repeated falls: Secondary | ICD-10-CM | POA: Diagnosis not present

## 2021-04-27 DIAGNOSIS — M6281 Muscle weakness (generalized): Secondary | ICD-10-CM | POA: Diagnosis not present

## 2021-04-27 DIAGNOSIS — R296 Repeated falls: Secondary | ICD-10-CM | POA: Diagnosis not present

## 2021-04-27 DIAGNOSIS — R2689 Other abnormalities of gait and mobility: Secondary | ICD-10-CM | POA: Diagnosis not present

## 2021-05-01 ENCOUNTER — Ambulatory Visit: Payer: Medicare PPO | Admitting: Internal Medicine

## 2021-05-01 ENCOUNTER — Other Ambulatory Visit: Payer: Self-pay

## 2021-05-01 ENCOUNTER — Encounter: Payer: Self-pay | Admitting: Internal Medicine

## 2021-05-01 VITALS — BP 164/82 | HR 69 | Ht 66.0 in | Wt 149.0 lb

## 2021-05-01 DIAGNOSIS — I10 Essential (primary) hypertension: Secondary | ICD-10-CM | POA: Diagnosis not present

## 2021-05-01 DIAGNOSIS — S7292XS Unspecified fracture of left femur, sequela: Secondary | ICD-10-CM | POA: Diagnosis not present

## 2021-05-01 DIAGNOSIS — M6281 Muscle weakness (generalized): Secondary | ICD-10-CM | POA: Diagnosis not present

## 2021-05-01 DIAGNOSIS — I951 Orthostatic hypotension: Secondary | ICD-10-CM

## 2021-05-01 DIAGNOSIS — I482 Chronic atrial fibrillation, unspecified: Secondary | ICD-10-CM | POA: Diagnosis not present

## 2021-05-01 DIAGNOSIS — Z9181 History of falling: Secondary | ICD-10-CM | POA: Diagnosis not present

## 2021-05-01 DIAGNOSIS — R2689 Other abnormalities of gait and mobility: Secondary | ICD-10-CM | POA: Diagnosis not present

## 2021-05-01 DIAGNOSIS — I5032 Chronic diastolic (congestive) heart failure: Secondary | ICD-10-CM | POA: Diagnosis not present

## 2021-05-01 DIAGNOSIS — S7292XA Unspecified fracture of left femur, initial encounter for closed fracture: Secondary | ICD-10-CM | POA: Diagnosis not present

## 2021-05-01 DIAGNOSIS — R296 Repeated falls: Secondary | ICD-10-CM | POA: Diagnosis not present

## 2021-05-01 NOTE — Patient Instructions (Signed)

## 2021-05-01 NOTE — Progress Notes (Signed)
Patient ID: [name], female   DOB: Feb 15, 1923, 87 y.o.   MRN: [id number]      Patient Care Team: [name], MD as PCP - General (Family Medicine) Susa Day, MD as Consulting Physician (Orthopedic Surgery) Druscilla Brownie, MD as Consulting Physician (Dermatology) Iran Planas, MD as Consulting Physician (Orthopedic Surgery) Michael Boston, MD as Consulting Physician (Colon and Rectal Surgery) Gavin Pound, MD as Consulting Physician (Rheumatology) Deboraha Sprang, MD as Consulting Physician (Cardiology)   HPI  [name] is a 87 y.o. female Seen in follow-up for atrial fibrillation for which she had been previously on amiodarone without benefit.  Also hypertension history of provoked DVT.anticoagulation w [facility]    Elected to undertake cardioversion to see if we could make an determination as to whether sinus was better.  6/21. Post cardioversion course was complicated by pneumonia. Following discharge from pneumonia she fell.   [place] again in the closet 1/22 looking up.  She says she passed out.  Has had recurrent lightheadedness with looking up.  Ended up with a hip fracture.  Underwent takedown of her colostomy 3/22     Today, The patient denies chest pain, shortness of breath, nocturnal dyspnea, orthopnea or peripheral edema.  There have been no palpitations,  Complains of lightheadedness indeed a heavy headedness with standing as well as the prior syncopal episode .  When she was at rehab, she had significant orthostatic lightheadedness prompting modification of her medications.  This seems to be less of an issue.  Heavy headedness that she gets when she stands up seems not to be abated by sitting down.  In fact CUBII aggravated by lying down.  She is inquiring about medications to be spaced out so she is not taking all medications at once or multiple times throughout the day    At night she sleeps in her bed comfortably with no dyspnea       She has pain in her R hand prior history of polymyalgia with pain in shoulders and hips for which she has been treated with Prednisone ;she notes at night the pain increase in her hand now She have tried to use a hand brace, but not a carpal tunnel brace, but was not effective  She have a appt  05/01/21 for nerve testing and will follow up 05/18/21 with the hand surgeon  She notes she have therapy for torn ligaments and partial left hip replacement (Jan 22) ;she tends to lean on the right leg more and is now going to rehab  She use a Walker to help with balance and posture  Has help with her 3 days out of the week and a lot of family support    DATE TEST EF   7/21 Echo  55-60 % LAE severe          Date Cr K Hgb  12/21 0.7 3.9 13.1  6/22  0.9 4.1 11.2   Records and Results Reviewed   Past Medical History:  Diagnosis Date   Closed fracture of left distal radius 12/29/2019   DVT (deep venous thrombosis) ([place]) 09/2015   RLE   Dysrhythmia    Afib   GERD (gastroesophageal reflux disease)    Hip fracture ([place]) 12/02/2020   Hypertension    Hypothyroidism    Melanoma ([place]) 06/16/2017   Facial melanoma, removed by Dr. Harvel Quale   Peripheral vascular disease [facility])    peripheral neuropathy    Past Surgical History:  Procedure Laterality Date  ABDOMINAL HYSTERECTOMY     APPENDECTOMY     BACK SURGERY     BREAST SURGERY     left breast biopsy   CARDIOVERSION N/A 05/09/2020   Procedure: CARDIOVERSION;  Surgeon: Skeet Latch, MD;  Location: [place];  Service: Cardiovascular;  Laterality: N/A;   COLOSTOMY N/A 01/06/2020   Procedure: Colostomy;  Surgeon: Greer Pickerel, MD;  Location: [place];  Service: General;  Laterality: N/A;   EYE SURGERY     cataract   HARDWARE REMOVAL Left 01/01/2020   Procedure: HARDWARE REMOVAL;  Surgeon: Iran Planas, MD;  Location: [place];  Service: Orthopedics;  Laterality: Left;   HIP ARTHROPLASTY Left 12/03/2020   Procedure: ARTHROPLASTY BIPOLAR HIP  (HEMIARTHROPLASTY);  Surgeon: Renette Butters, MD;  Location: [place];  Service: Orthopedics;  Laterality: Left;   HIP ARTHROPLASTY Left    partial   LAPAROTOMY N/A 01/06/2020   Procedure: Exploratory Laparotomy, Open Sigmoid colectomy;  Surgeon: Greer Pickerel, MD;  Location: [place];  Service: General;  Laterality: N/A;   LIPOMA EXCISION Left 04/25/2015   Procedure: EXCISION OF LIPOMA LEFT ARM;  Surgeon: Donnie Mesa, MD;  Location: [place];  Service: General;  Laterality: Left;   LUMBAR LAMINECTOMY/DECOMPRESSION MICRODISCECTOMY  11/20/2011   Procedure: LUMBAR LAMINECTOMY/DECOMPRESSION MICRODISCECTOMY;  Surgeon: Laurice Record [place];  [facility]: [facility];  Service: Orthopedics;  Laterality: N/A;  Decompressive Laminectomy L2 to the Sacrum (X-Ray)   LYSIS OF ADHESION N/A 01/17/2021   Procedure: RIGID PROCTOSCOPY;  Surgeon: Michael Boston, MD;  [facility];  Service: General;  Laterality: N/A;   OPEN REDUCTION INTERNAL FIXATION (ORIF) DISTAL RADIAL FRACTURE Left 01/01/2020   Procedure: OPEN REDUCTION INTERNAL FIXATION (ORIF) DISTAL RADIAL FRACTURE;  Surgeon: Iran Planas, MD;  Location: [place];  Service: Orthopedics;  Laterality: Left;   OTHER SURGICAL HISTORY     left wrist surgery - has plate in left wrist    OTHER SURGICAL HISTORY     right knee surgery due to torn cartilage    TONSILLECTOMY     WRIST FRACTURE SURGERY Left    XI ROBOTIC ASSISTED COLOSTOMY TAKEDOWN N/A 01/17/2021   Procedure: XI ROBOTIC ASSISTED OSTOMY TAKEDOWN, LYSIS OF ADHESIONS, RECTOSIGMOID RESECTION, BILATERAL TAP BLOCK;  Surgeon: Michael Boston, MD;  [facility];  Service: General;  Laterality: N/A;    Current Meds  Medication Sig   acetaminophen (TYLENOL) 500 MG tablet Take 500 mg by mouth at bedtime.   apixaban (ELIQUIS) 5 MG TABS tablet Take 1 tablet (5 mg total) by mouth 2 (two) times daily.   diltiazem (CARDIZEM SR) 60 MG 12 hr capsule Take 1 capsule (60 mg total) by mouth 2 (two) times daily.    docusate sodium (COLACE) 100 MG capsule Take 1 capsule (100 mg total) by mouth 2 (two) times daily.   gabapentin (NEURONTIN) 300 MG capsule Take 1 capsule (300 mg total) by mouth at bedtime.   levothyroxine (SYNTHROID) 50 MCG tablet TAKE 1 TABLET BY MOUTH EVERY DAY   losartan (COZAAR) 50 MG tablet Take 50 mg by mouth daily.   nadolol (CORGARD) 80 MG tablet Take 80 mg by mouth daily.   pantoprazole (PROTONIX) 40 MG tablet TAKE 1 TABLET BY MOUTH EVERY DAY   predniSONE (DELTASONE) 1 MG tablet Take 4 tablets (4 mg total) by mouth daily with lunch.   sucralfate (CARAFATE) 1 g tablet Take 1 tablet (1 g total) by mouth with breakfast, with lunch, and with evening meal.    Allergies  Allergen Reactions  Keflex [Cephalexin] Nausea Only and Other (See Comments)    Pt ended up in ER w/ CHEST PAIN   Codeine Other (See Comments)    "Nightmares, imagined things"    Review of Systems negative except from HPI and PMH  Physical Exam: BP (!) 164/82   Pulse 69   Ht 5\' 6"  (1.676 m)   Wt 149 lb (67.6 kg)   SpO2 96%   BMI 24.05 kg/m  Well developed and well nourished in no acute distress HENT normal Neck supple with JVP-flat Lungs Clear Device pocket well healed; without hematoma or erythema.  There is no tethering  Regular rate and rhythm, No gallop No murmur Abd-soft with active BS No Clubbing cyanosis No edema Skin-warm and dry A & Oriented  Grossly normal sensory and motor function  ECG sinus at 69 Interval 21/08/39 Axis left -48 RSR prime   Assessment and  Plan  Atrial fibrillation persistent   Hypertension   HFpEF  Anemia  Syncope   Holding sinus rhythm continue apixaban 5 mg twice daily based on weight and renal function.  Continue nadolol 80  Blood pressure reasonably controlled.  We will continue the losartan 50 mg and diltiazem 60 twice daily.  I have suggested that we try to consolidate the diltiazem back to a daily medication and have her take it at night.  She is  open to the idea.  She has been to follow-up with her PCP.  I have done this  Pt heart failure status is stable. Continue her off Lasix  Chronic low-grade anemia with incomplete recovery following her GI surgery I have reached out to Dr. Birdie Riddle to see whether she would benefit from iron replacement.  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  We discussed the mechanism as well as the potential benefits of diagnosis as pacing can help mitigate the symptoms.  She is currently not inclined, as it would be "just 1 more thing "    I,Stephanie Williams,acting as a scribe for Virl Axe, MD.,have documented all relevant documentation on the behalf of Virl Axe, MD,as directed by  Virl Axe, MD while in the presence of Virl Axe, MD. I, Virl Axe, MD, have reviewed all documentation for this visit. The documentation on 05/01/21 for the exam, diagnosis, procedures, and orders are all accurate and complete.

## 2021-05-02 DIAGNOSIS — G5603 Carpal tunnel syndrome, bilateral upper limbs: Secondary | ICD-10-CM | POA: Diagnosis not present

## 2021-05-03 ENCOUNTER — Ambulatory Visit: Payer: Medicare PPO | Admitting: Internal Medicine

## 2021-05-03 DIAGNOSIS — R2689 Other abnormalities of gait and mobility: Secondary | ICD-10-CM | POA: Diagnosis not present

## 2021-05-03 DIAGNOSIS — M6281 Muscle weakness (generalized): Secondary | ICD-10-CM | POA: Diagnosis not present

## 2021-05-03 DIAGNOSIS — R296 Repeated falls: Secondary | ICD-10-CM | POA: Diagnosis not present

## 2021-05-06 ENCOUNTER — Telehealth: Payer: Self-pay | Admitting: Family Medicine

## 2021-05-07 ENCOUNTER — Other Ambulatory Visit: Payer: Self-pay | Admitting: Family Medicine

## 2021-05-07 MED ORDER — LOSARTAN POTASSIUM 50 MG PO TABS: 50.0000 mg | ORAL_TABLET | Freq: Every day | ORAL | 1 refills | Status: DC

## 2021-05-07 NOTE — Progress Notes (Signed)
Refill of Losartan sent for 50mg  (new dose.  Cards made change on 05/01/21)

## 2021-05-07 NOTE — Telephone Encounter (Signed)
Called and spoke with patient daughter about Rx. Daughter stated that she asked for her old Rx and not the new one which is 50mg  of the losartan. Per provider, correct Rx has been filled and sent to patient pharmacy.

## 2021-05-07 NOTE — Telephone Encounter (Signed)
Patient's daughter called and needs to know why her mom's losartain refill request was denied?  Please advise.

## 2021-05-09 ENCOUNTER — Encounter: Payer: Self-pay | Admitting: *Deleted

## 2021-05-15 DIAGNOSIS — G5603 Carpal tunnel syndrome, bilateral upper limbs: Secondary | ICD-10-CM | POA: Diagnosis not present

## 2021-05-16 DIAGNOSIS — M5459 Other low back pain: Secondary | ICD-10-CM | POA: Diagnosis not present

## 2021-05-16 DIAGNOSIS — M5416 Radiculopathy, lumbar region: Secondary | ICD-10-CM | POA: Diagnosis not present

## 2021-05-16 DIAGNOSIS — M5136 Other intervertebral disc degeneration, lumbar region: Secondary | ICD-10-CM | POA: Diagnosis not present

## 2021-05-21 DIAGNOSIS — M545 Low back pain, unspecified: Secondary | ICD-10-CM | POA: Diagnosis not present

## 2021-05-22 DIAGNOSIS — S7292XS Unspecified fracture of left femur, sequela: Secondary | ICD-10-CM | POA: Diagnosis not present

## 2021-05-22 DIAGNOSIS — M6281 Muscle weakness (generalized): Secondary | ICD-10-CM | POA: Diagnosis not present

## 2021-05-22 DIAGNOSIS — S7292XA Unspecified fracture of left femur, initial encounter for closed fracture: Secondary | ICD-10-CM | POA: Diagnosis not present

## 2021-05-22 DIAGNOSIS — Z9181 History of falling: Secondary | ICD-10-CM | POA: Diagnosis not present

## 2021-05-22 DIAGNOSIS — I10 Essential (primary) hypertension: Secondary | ICD-10-CM | POA: Diagnosis not present

## 2021-05-22 DIAGNOSIS — R2689 Other abnormalities of gait and mobility: Secondary | ICD-10-CM | POA: Diagnosis not present

## 2021-05-25 ENCOUNTER — Other Ambulatory Visit: Payer: Self-pay | Admitting: Chiropractic Medicine

## 2021-05-25 DIAGNOSIS — M5416 Radiculopathy, lumbar region: Secondary | ICD-10-CM | POA: Diagnosis not present

## 2021-05-25 DIAGNOSIS — M4854XA Collapsed vertebra, not elsewhere classified, thoracic region, initial encounter for fracture: Secondary | ICD-10-CM | POA: Diagnosis not present

## 2021-05-25 DIAGNOSIS — S22080A Wedge compression fracture of T11-T12 vertebra, initial encounter for closed fracture: Secondary | ICD-10-CM

## 2021-05-29 ENCOUNTER — Other Ambulatory Visit: Payer: Self-pay | Admitting: Chiropractic Medicine

## 2021-05-29 ENCOUNTER — Ambulatory Visit
Admission: RE | Admit: 2021-05-29 | Discharge: 2021-05-29 | Disposition: A | Discharge status: Home / Self Care | Payer: Self-pay | Source: Ambulatory Visit | Attending: Chiropractic Medicine | Admitting: Chiropractic Medicine

## 2021-05-29 DIAGNOSIS — S22080A Wedge compression fracture of T11-T12 vertebra, initial encounter for closed fracture: Secondary | ICD-10-CM

## 2021-05-30 ENCOUNTER — Telehealth: Payer: Self-pay | Admitting: *Deleted

## 2021-05-30 ENCOUNTER — Telehealth: Payer: Self-pay

## 2021-05-30 ENCOUNTER — Other Ambulatory Visit: Payer: Self-pay | Admitting: Internal Medicine

## 2021-05-30 ENCOUNTER — Ambulatory Visit
Admission: RE | Admit: 2021-05-30 | Discharge: 2021-05-30 | Disposition: A | Discharge status: Home / Self Care | Payer: Medicare PPO | Source: Ambulatory Visit | Attending: Chiropractic Medicine | Admitting: Chiropractic Medicine

## 2021-05-30 DIAGNOSIS — S22080A Wedge compression fracture of T11-T12 vertebra, initial encounter for closed fracture: Secondary | ICD-10-CM

## 2021-05-30 NOTE — Consult Note (Signed)
Chief Complaint: Patient was seen in consultation today for back pain at the request of Knowles,Carol  Referring Physician(s): Knowles,Carol  History of Present Illness: [name] is a 87 y.o. female [name] is an extremely pleasant 87 year old female who presents at the kind request of Dr. Levy Pupa for evaluation of a subacute T12 compression fracture.  Approximately 3 weeks ago she developed right lower rib pain which slowly worsening began radiating around her side to the midline of her back.  She denies any specific fall or movement just prior to the onset of pain.  However, approximately 2 days before the onset of pain she was undergoing physical therapy which she reports involved a lot of bending, twisting and other active movements.  At times, the pain is severe and she rates it a 10/10 on a 10 point scale.  She notes that almost any movement that she performs including bending, turning, and changing position from standing to sitting or laying significantly exacerbates her pain.  She tried her best to ignore the pain for approximately 1 week before became too much and she sought medical attention.  An MRI demonstrates an acute fracture of the T12 vertebral body with approximately 50-60% height loss.  There is mild bony retropulsion but no significant impingement on the cord.]   Over the past 2 weeks she has been undergoing conservative therapy including hydrocodone acetaminophen which she initially took every 6 hours.  Unfortunately, this was insufficient to control her pain and she has had to increase the frequency of dosing to every 5 hours.  This keeps the pain at [facility], but she is suffering with constipation, occasional nausea and lightheadedness.  Additionally, despite 2 weeks of rest and conservative management her pain has not improved at all.  Her activity level is significantly decreased and she feels like she is unable to do anything without exacerbating.  She  denies lower extremity weakness, paresthesias or other symptoms of myelopathy.  Past Medical History:  Diagnosis Date   Closed fracture of left distal radius 12/29/2019   DVT (deep venous thrombosis) ([place]) 09/2015   RLE   Dysrhythmia    Afib   GERD (gastroesophageal reflux disease)    Hip fracture ([place]) 12/02/2020   Hypertension    Hypothyroidism    Melanoma ([place]) 06/16/2017   Facial melanoma, removed by Dr. Harvel Quale   Peripheral vascular disease [facility])    peripheral neuropathy    Past Surgical History:  Procedure Laterality Date   ABDOMINAL HYSTERECTOMY     APPENDECTOMY     BACK SURGERY     BREAST SURGERY     left breast biopsy   CARDIOVERSION N/A 05/09/2020   Procedure: CARDIOVERSION;  Surgeon: Skeet Latch, MD;  Location: [facility];  Service: Cardiovascular;  Laterality: N/A;   COLOSTOMY N/A 01/06/2020   Procedure: Colostomy;  Surgeon: Greer Pickerel, MD;  Location: [place];  Service: General;  Laterality: N/A;   EYE SURGERY     cataract   HARDWARE REMOVAL Left 01/01/2020   Procedure: HARDWARE REMOVAL;  Surgeon: Iran Planas, MD;  Location: [place];  Service: Orthopedics;  Laterality: Left;   HIP ARTHROPLASTY Left 12/03/2020   Procedure: ARTHROPLASTY BIPOLAR HIP (HEMIARTHROPLASTY);  Surgeon: Renette Butters, MD;  Location: [place];  Service: Orthopedics;  Laterality: Left;   HIP ARTHROPLASTY Left    partial   LAPAROTOMY N/A 01/06/2020   Procedure: Exploratory Laparotomy, Open Sigmoid colectomy;  Surgeon: Greer Pickerel, MD;  Location:  [place] [facility];  Service: General;  Laterality: N/A;   LIPOMA EXCISION Left 04/25/2015   Procedure: EXCISION OF LIPOMA LEFT ARM;  Surgeon: Donnie Mesa, MD;  Location: [place];  Service: General;  Laterality: Left;   LUMBAR LAMINECTOMY/DECOMPRESSION MICRODISCECTOMY  11/20/2011   Procedure: LUMBAR LAMINECTOMY/DECOMPRESSION MICRODISCECTOMY;  Surgeon: Laurice Record [place];  [facility];  Service: Orthopedics;  Laterality: N/A;  Decompressive  Laminectomy L2 to the Sacrum (X-Ray)   LYSIS OF ADHESION N/A 01/17/2021   Procedure: RIGID PROCTOSCOPY;  Surgeon: Michael Boston, MD;  [facility];  Service: General;  Laterality: N/A;   OPEN REDUCTION INTERNAL FIXATION (ORIF) DISTAL RADIAL FRACTURE Left 01/01/2020   Procedure: OPEN REDUCTION INTERNAL FIXATION (ORIF) DISTAL RADIAL FRACTURE;  Surgeon: Iran Planas, MD;  Location: [place];  Service: Orthopedics;  Laterality: Left;   OTHER SURGICAL HISTORY     left wrist surgery - has plate in left wrist    OTHER SURGICAL HISTORY     right knee surgery due to torn cartilage    TONSILLECTOMY     WRIST FRACTURE SURGERY Left    XI ROBOTIC ASSISTED COLOSTOMY TAKEDOWN N/A 01/17/2021   Procedure: XI ROBOTIC ASSISTED OSTOMY TAKEDOWN, LYSIS OF ADHESIONS, RECTOSIGMOID RESECTION, BILATERAL TAP BLOCK;  Surgeon: Michael Boston, MD;  [facility];  Service: General;  Laterality: N/A;    Allergies: Keflex [cephalexin] and Codeine  Medications: Prior to Admission medications   Medication Sig Start Date End Date Taking? Authorizing Provider  acetaminophen (TYLENOL) 500 MG tablet Take 500 mg by mouth at bedtime.   Yes [provider]  apixaban (ELIQUIS) 5 MG TABS tablet Take 1 tablet (5 mg total) by mouth 2 (two) times daily. 06/20/20  Yes Deboraha Sprang, MD  diltiazem (CARDIZEM SR) 60 MG 12 hr capsule Take 1 capsule (60 mg total) by mouth 2 (two) times daily. 01/22/21  Yes [name], MD  docusate sodium (COLACE) 100 MG capsule Take 1 capsule (100 mg total) by mouth 2 (two) times daily. 01/20/20  Yes Love, Ivan Anchors, PA-C  gabapentin (NEURONTIN) 300 MG capsule Take 1 capsule (300 mg total) by mouth at bedtime. 04/19/21  Yes [name], MD  levothyroxine (SYNTHROID) 50 MCG tablet TAKE 1 TABLET BY MOUTH EVERY DAY 10/02/20  Yes [name], MD  losartan (COZAAR) 50 MG tablet Take 1 tablet (50 mg total) by mouth daily. 05/07/21  Yes [name], MD  nadolol (CORGARD) 80 MG  tablet Take 80 mg by mouth daily.   Yes [provider]  pantoprazole (PROTONIX) 40 MG tablet TAKE 1 TABLET BY MOUTH EVERY DAY 04/12/21  Yes [name], MD  predniSONE (DELTASONE) 1 MG tablet Take 4 tablets (4 mg total) by mouth daily with lunch. 12/07/20  Yes Antonieta Pert, MD  sucralfate (CARAFATE) 1 g tablet Take 1 tablet (1 g total) by mouth with breakfast, with lunch, and with evening meal. 03/01/21  Yes [name], MD     Family History  Problem Relation Age of Onset   Cancer Mother        BREAST   COPD Father    Emphysema Father    Cancer Daughter        breast    Social History   Socioeconomic History   Marital status: Widowed    Spouse name: Not on file   Number of children: Not on file   Years of education: Not on file   Highest education level: Not on  file  Occupational History   Not on file  Tobacco Use   Smoking status: Never   Smokeless tobacco: Never  Vaping Use   Vaping Use: Never used  Substance and Sexual Activity   Alcohol use: No   Drug use: No   Sexual activity: Never  Other Topics Concern   Not on file  Social History Narrative   Not on file   Social Determinants of Health   Financial Resource Strain: Low Risk    Difficulty of Paying Living Expenses: Not hard at all  Food Insecurity: No Food Insecurity   Worried About Charity fundraiser in the Last Year: Never true   [place] in the Last Year: Never true  Transportation Needs: No Transportation Needs   Lack of Transportation (Medical): No   Lack of Transportation (Non-Medical): No  Physical Activity: Insufficiently Active   Days of Exercise per Week: 2 days   Minutes of Exercise per Session: 60 min  Stress: No Stress Concern Present   Feeling of Stress : Not at all  Social Connections: Moderately Isolated   Frequency of Communication with Friends and Family: More than three times a week   Frequency of Social Gatherings with Friends and Family: Once a week    Attends Religious Services: More than 4 times per year   Active Member of [employer] or Organizations: No   Attends Archivist Meetings: Never   Marital Status: Widowed    Review of Systems: A 12 point ROS discussed and pertinent positives are indicated in the HPI above.  All other systems are negative.  Review of Systems  Vital Signs: BP 134/66   Pulse 63   Temp 98.4 F (36.9 C) (Oral)   SpO2 98% Comment: room air  Physical Exam Vitals and nursing note reviewed.  Constitutional:      Appearance: Normal appearance.  HENT:     Head: Normocephalic and atraumatic.  Eyes:     General: No scleral icterus. Cardiovascular:     Rate and Rhythm: Normal rate.  Pulmonary:     Effort: Pulmonary effort is normal.  Abdominal:     General: Abdomen is flat.     Palpations: Abdomen is soft.  Musculoskeletal:       Arms:     Comments: TP overlying the T12 spinous process.   Skin:    General: Skin is warm and dry.  Neurological:     Mental Status: She is alert and oriented to person, place, and time.  Psychiatric:        Mood and Affect: Mood normal.        Behavior: Behavior normal.      Imaging: MR OUTSIDE FILMS SPINE  Result Date: 05/29/2021 This examination belongs to an outside facility and is stored here for comparison purposes only.  Contact the originating outside institution for any associated report or interpretation.   Labs:  CBC: Recent Labs    01/09/21 1129 01/18/21 0507 01/20/21 1028 04/19/21 1105  WBC 12.5* 18.1* 11.3* 13.3*  HGB 11.3* 9.3* 9.1* 11.2*  HCT 36.2 30.2* 29.4* 35.5*  PLT 355 362 385 375.0    COAGS: Recent Labs    12/02/20 1420  INR 1.2    BMP: Recent Labs    10/31/20 1515 11/08/20 1115 11/29/20 1137 12/02/20 1420 12/06/20 0213 12/07/20 0120 01/09/21 1129 01/18/21 0507 01/20/21 1028 04/19/21 1105  NA 142   < > 140   < > 137 137 142 138  --  142  K 5.1   < > 4.5   < > 4.1 4.3 4.2 4.1 3.6 4.1  CL 104   < > 102   < >  103 103 107 108  --  105  CO2 26   < > 25   < > 26 26 28  20*  --  29  GLUCOSE 108*   < > 91   < > 95 111* 106* 145*  --  83  BUN 13   < > 14   < > 17 18 13 14   --  18  CALCIUM 9.6   < > 9.8   < > 8.7* 8.7* 9.2 8.5*  --  9.2  CREATININE 0.94   < > 1.15*   < > 0.78 0.88 0.75 0.90  --  0.90  GFRNONAA 52*   < > 41*   < > >60 >60 >60 59*  --   --   GFRAA 60  --  47*  --   --   --   --   --   --   --    < > = values in this interval not displayed.    LIVER FUNCTION TESTS: Recent Labs    09/26/20 1416 10/12/20 1738 12/02/20 1420 04/19/21 1105  BILITOT 0.4 0.5 0.6 0.5  AST 13 18 20 12   ALT 11 16 16 11   ALKPHOS 92 80 83 89  PROT 6.3 6.7 6.2* 6.3  ALBUMIN 3.8 3.7 3.6 3.7    TUMOR MARKERS: No results for input(s): AFPTM, CEA, CA199, CHROMGRNA in the last 8760 hours.  Assessment and Plan:  87 year old female with a highly symptomatic acute to subacute fracture of the T12 vertebral body with approximately 60% height loss.  She is tolerating conservative management but not improving and her level of activity and quality of life are significantly diminished. She is an excellent candidate for percutaneous cement augmentation with balloon kyphoplasty.   1.)  Schedule for T12 cement augmentation with balloon kyphoplasty at my next available appointment.  Thank you for this interesting consult.  I greatly enjoyed meeting [name] and look forward to participating in their care.  A copy of this report was sent to the requesting provider on this date.  Electronically Signed: Criselda Peaches 05/30/2021, 2:30 PM   I spent a total of  40 Minutes  in face to face in clinical consultation, greater than 50% of which was counseling/coordinating care for T12 compression fracture.

## 2021-05-30 NOTE — Telephone Encounter (Signed)
   [facility] Pre-operative Risk Assessment    Patient Name: ERIYANNA KOFOED  DOB: 1922/11/21 MRN: [id number]  HEARTCARE STAFF:  - IMPORTANT!!!!!! Under Visit Info/Reason for Call, type in Other and utilize the format Clearance MM/DD/YY or Clearance TBD. Do not use dashes or single digits. - Please review there is not already an duplicate clearance open for this procedure. - If request is for dental extraction, please clarify the # of teeth to be extracted. - If the patient is currently at the dentist's office, call Pre-Op Callback Staff (MA/nurse) to input urgent request.  - If the patient is not currently in the dentist office, please route to the Pre-Op pool.  Request for surgical clearance:  What type of surgery is being performed? HYPOPLASTY   When is this surgery scheduled? TBD  What type of clearance is required (medical clearance vs. Pharmacy clearance to hold med vs. Both)? BOTH  Are there any medications that need to be held prior to surgery and how long? ELIQUIS x 2 DAYS PRIOR  Practice name and name of physician performing surgery? [facility]  What is the office phone number? [phone]   7.   What is the office fax number? [phone] ATTN: CATHY C  8.   Anesthesia type (None, local, MAC, general) ? LOCAL   Julaine Hua 05/30/2021, 5:42 PM  _________________________________________________________________   (provider comments below)

## 2021-05-30 NOTE — Telephone Encounter (Signed)
Patient called stating that she is getting a compression fracture procedure done and the provider needs a CBC and was wanting to know if she is able to come here and have it done tomorrow? Please advise.

## 2021-05-31 ENCOUNTER — Other Ambulatory Visit (INDEPENDENT_AMBULATORY_CARE_PROVIDER_SITE_OTHER): Payer: Medicare PPO

## 2021-05-31 ENCOUNTER — Other Ambulatory Visit: Payer: Self-pay

## 2021-05-31 DIAGNOSIS — R2689 Other abnormalities of gait and mobility: Secondary | ICD-10-CM | POA: Diagnosis not present

## 2021-05-31 DIAGNOSIS — Z9181 History of falling: Secondary | ICD-10-CM | POA: Diagnosis not present

## 2021-05-31 DIAGNOSIS — S7292XS Unspecified fracture of left femur, sequela: Secondary | ICD-10-CM | POA: Diagnosis not present

## 2021-05-31 DIAGNOSIS — I10 Essential (primary) hypertension: Secondary | ICD-10-CM | POA: Diagnosis not present

## 2021-05-31 DIAGNOSIS — S7292XA Unspecified fracture of left femur, initial encounter for closed fracture: Secondary | ICD-10-CM | POA: Diagnosis not present

## 2021-05-31 DIAGNOSIS — M6281 Muscle weakness (generalized): Secondary | ICD-10-CM | POA: Diagnosis not present

## 2021-05-31 LAB — CBC
HCT: 36 % (ref 36.0–46.0)
Hemoglobin: 11.4 g/dL — ABNORMAL LOW (ref 12.0–15.0)
MCHC: 31.7 g/dL (ref 30.0–36.0)
MCV: 84.1 fl (ref 78.0–100.0)
Platelets: 416 10*3/uL — ABNORMAL HIGH (ref 150.0–400.0)
RBC: 4.28 Mil/uL (ref 3.87–5.11)
RDW: 15.5 % (ref 11.5–15.5)
WBC: 8.2 10*3/uL (ref 4.0–10.5)

## 2021-06-04 ENCOUNTER — Telehealth: Payer: Self-pay | Admitting: Internal Medicine

## 2021-06-04 NOTE — Telephone Encounter (Signed)
Called patient, she states she had cbc drawn last week.

## 2021-06-04 NOTE — Telephone Encounter (Signed)
I did not need this encounter. °

## 2021-06-05 NOTE — Telephone Encounter (Signed)
I have spoke with [name] and she confirmed that the patient is having a Kyphoplasty.

## 2021-06-05 NOTE — Telephone Encounter (Signed)
Patient with diagnosis of atrial fibrillation on Eliquis for anticoagulation.    Procedure: hypoplasty  (kyphoplasty??) Date of procedure: TBD   CHA2DS2-VASc Score = 5  This indicates a 7.2% annual risk of stroke. The patient's score is based upon: CHF History: Yes HTN History: Yes Diabetes History: No Stroke History: No Vascular Disease History: No Age Score: 2 Gender Score: 1    CrCl 39 Platelet count 375  Per office protocol, patient can hold Eliquis for 3 days prior to procedure.   Patient will not need bridging with Lovenox (enoxaparin) around procedure.  If not bridging, patient should restart Eliquis on the evening of procedure or day after, at discretion of procedure MD  IF THIS IS A SPINAL PROCEDURE ELIQUIS MUST BE HELD X 3 DAYS.

## 2021-06-05 NOTE — Telephone Encounter (Signed)
   Primary Cardiologist: Virl Axe, MD  Chart reviewed as part of pre-operative protocol coverage. Given past medical history and time since last visit, based on ACC/AHA guidelines, GRADY VILLADA would be at acceptable risk for the planned procedure without further cardiovascular testing.   Patient with diagnosis of atrial fibrillation on Eliquis for anticoagulation.     Procedure: kyphoplasty   Date of procedure: TBD     CHA2DS2-VASc Score = 5  This indicates a 7.2% annual risk of stroke. The patient's score is based upon: CHF History: Yes HTN History: Yes Diabetes History: No Stroke History: No Vascular Disease History: No Age Score: 2 Gender Score: 1      CrCl 39 Platelet count 375   Per office protocol, patient can hold Eliquis for 3 days prior to procedure.   Patient will not need bridging with Lovenox (enoxaparin) around procedure.   If not bridging, patient should restart Eliquis on the evening of procedure or day after, at discretion of procedure MD   I will route this recommendation to the requesting party via [place] fax function and remove from pre-op pool.  Please call with questions.  Jossie Ng. Amica Harron NP-C    06/05/2021, 11:42 AM [address] Office [phone] Fax [phone]

## 2021-06-05 NOTE — Telephone Encounter (Signed)
Preop team, please clarify the procedure being performed by the requesting office.  Thank you,  Jossie Ng. Thresea Doble NP-C    06/05/2021, 8:25 AM [place] Office [phone] Fax [phone]

## 2021-06-06 ENCOUNTER — Other Ambulatory Visit: Payer: Self-pay | Admitting: Family Medicine

## 2021-06-08 ENCOUNTER — Inpatient Hospital Stay: Admission: RE | Admit: 2021-06-08 | Payer: Medicare PPO | Source: Ambulatory Visit

## 2021-06-22 DIAGNOSIS — M6281 Muscle weakness (generalized): Secondary | ICD-10-CM | POA: Diagnosis not present

## 2021-06-22 DIAGNOSIS — S7292XA Unspecified fracture of left femur, initial encounter for closed fracture: Secondary | ICD-10-CM | POA: Diagnosis not present

## 2021-06-22 DIAGNOSIS — I10 Essential (primary) hypertension: Secondary | ICD-10-CM | POA: Diagnosis not present

## 2021-06-22 DIAGNOSIS — S7292XS Unspecified fracture of left femur, sequela: Secondary | ICD-10-CM | POA: Diagnosis not present

## 2021-06-22 DIAGNOSIS — Z9181 History of falling: Secondary | ICD-10-CM | POA: Diagnosis not present

## 2021-06-22 DIAGNOSIS — R2689 Other abnormalities of gait and mobility: Secondary | ICD-10-CM | POA: Diagnosis not present

## 2021-06-26 NOTE — Progress Notes (Signed)
Phone call to pt to discuss her allergy to Keflex. Pt reports she developed chest pain after taking Keflex. The patient also reports she was prescribed to take it 4x a day and her primary care doctor said that was too high of a dose, resulting in her pain. The patient reports she ended up in the ED with her chest hurting but cardiac causes were ruled out. The patient denied any shortness of breath, hives, rash, itching, or swelling from taking the Keflex. The patient is scheduled to have a kyphoplasty here at [facility] tomorrow 06/27/21 and we normally give ancef prior to this procedure. I discussed this patient at length with Dr. Vernard Gambles, who stated he does not feel like chest pain was a true allergic reaction, therefore, he feels that ancef is a safe option to be given.

## 2021-06-27 ENCOUNTER — Other Ambulatory Visit: Payer: Self-pay | Admitting: Chiropractic Medicine

## 2021-06-27 ENCOUNTER — Other Ambulatory Visit: Payer: Self-pay

## 2021-06-27 ENCOUNTER — Ambulatory Visit
Admission: RE | Admit: 2021-06-27 | Discharge: 2021-06-27 | Disposition: A | Discharge status: Home / Self Care | Payer: Medicare PPO | Source: Ambulatory Visit | Attending: Chiropractic Medicine | Admitting: Chiropractic Medicine

## 2021-06-27 DIAGNOSIS — S22080A Wedge compression fracture of T11-T12 vertebra, initial encounter for closed fracture: Secondary | ICD-10-CM

## 2021-06-27 HISTORY — PX: IR KYPHO EA ADDL LEVEL THORACIC OR LUMBAR: IMG5520

## 2021-06-27 MED ORDER — FENTANYL CITRATE PF 50 MCG/ML IJ SOSY
25.0000 ug | PREFILLED_SYRINGE | INTRAMUSCULAR | Status: DC | PRN
Start: 2021-06-27 — End: 2021-06-28
  Administered 2021-06-27 (×4): 25 ug via INTRAVENOUS

## 2021-06-27 MED ORDER — IOPAMIDOL (ISOVUE-M 200) INJECTION 41%: 10.0000 mL | Freq: Once | INTRAMUSCULAR | Status: DC

## 2021-06-27 MED ORDER — KETOROLAC TROMETHAMINE 30 MG/ML IJ SOLN
30.0000 mg | Freq: Once | INTRAMUSCULAR | Status: AC
  Administered 2021-06-27: 30 mg via INTRAMUSCULAR

## 2021-06-27 MED ORDER — MIDAZOLAM HCL 2 MG/2ML IJ SOLN
1.0000 mg | INTRAMUSCULAR | Status: DC | PRN
Start: 2021-06-27 — End: 2021-06-28
  Administered 2021-06-27 (×2): 0.5 mg via INTRAVENOUS
  Administered 2021-06-27: 1 mg via INTRAVENOUS

## 2021-06-27 MED ORDER — CEFAZOLIN SODIUM-DEXTROSE 2-4 GM/100ML-% IV SOLN
2.0000 g | Freq: Three times a day (TID) | INTRAVENOUS | Status: DC
  Administered 2021-06-27: 2 g via INTRAVENOUS

## 2021-06-27 MED ORDER — SODIUM CHLORIDE 0.9 % IV SOLN: INTRAVENOUS | Status: DC

## 2021-06-27 NOTE — Discharge Instructions (Signed)
Kyphoplasty Post Procedure Discharge Instructions  May resume a regular diet and any medications that you routinely take (including pain medications). However, if you are taking Aspirin or an anticoagulant/blood thinner you will be told when you can resume taking these by the healthcare provider. No driving day of procedure. The day of your procedure take it easy. You may use an ice pack as needed to injection sites on back.  Ice to back 30 minutes on and 30 minutes off, as needed. May remove bandaids tomorrow after taking a shower. Replace daily with a clean bandaid until healed.  Do not lift anything heavier than a milk jug for 1-2 weeks or determined by your physician.  Follow up with your physician in 2 weeks.    Please contact our office at [phone] for the following symptoms or if you have any questions:  Fever greater than 100 degrees Increased swelling, pain, or redness at injection site. Increased back and/or leg pain New numbness or change in symptoms from before the procedure.    Thank you for visiting [facility].   YOU MAY RESUME YOUR ELIQUIS 24 HOURS AFTER PROCEDURE

## 2021-06-27 NOTE — Progress Notes (Signed)
Pt back in nursing recovery area. Pt alert and awake. Pt follows commands, talks in complete sentences and has no complaints at this time. Pt will be monitored until discharged by Radiologist.

## 2021-07-01 DIAGNOSIS — Z9181 History of falling: Secondary | ICD-10-CM | POA: Diagnosis not present

## 2021-07-01 DIAGNOSIS — S7292XS Unspecified fracture of left femur, sequela: Secondary | ICD-10-CM | POA: Diagnosis not present

## 2021-07-01 DIAGNOSIS — S7292XA Unspecified fracture of left femur, initial encounter for closed fracture: Secondary | ICD-10-CM | POA: Diagnosis not present

## 2021-07-01 DIAGNOSIS — R2689 Other abnormalities of gait and mobility: Secondary | ICD-10-CM | POA: Diagnosis not present

## 2021-07-01 DIAGNOSIS — I10 Essential (primary) hypertension: Secondary | ICD-10-CM | POA: Diagnosis not present

## 2021-07-01 DIAGNOSIS — M6281 Muscle weakness (generalized): Secondary | ICD-10-CM | POA: Diagnosis not present

## 2021-07-06 ENCOUNTER — Telehealth: Payer: Self-pay

## 2021-07-06 NOTE — Telephone Encounter (Signed)
Phone call to patient to follow up with her after her kyphoplasty she had here at [facility] on 06/27/21. PT reports a significant relief in her pain. She states "the pain is gone". The patient does report a little soreness with certain movements but it is getting better every day. The patient denied any fever, chills, or redness at the site. The patient has a telephone appointment with Dr. Laurence Ferrari on 07/12/21, the patient states an understanding of this. I did advise the patient to call us if anything changes or any new pain occurs and we can change this appointment to an in office appointment. The patient verbalized understanding and has our direct contact information if she were to need it.

## 2021-07-13 ENCOUNTER — Ambulatory Visit
Admission: RE | Admit: 2021-07-13 | Discharge: 2021-07-13 | Disposition: A | Discharge status: Home / Self Care | Payer: Medicare PPO | Source: Ambulatory Visit | Attending: Interventional Radiology | Admitting: Interventional Radiology

## 2021-07-13 ENCOUNTER — Other Ambulatory Visit: Payer: Self-pay | Admitting: Interventional Radiology

## 2021-07-13 ENCOUNTER — Other Ambulatory Visit: Payer: Self-pay

## 2021-07-13 DIAGNOSIS — S22080D Wedge compression fracture of T11-T12 vertebra, subsequent encounter for fracture with routine healing: Secondary | ICD-10-CM | POA: Diagnosis not present

## 2021-07-13 DIAGNOSIS — S22080A Wedge compression fracture of T11-T12 vertebra, initial encounter for closed fracture: Secondary | ICD-10-CM

## 2021-07-13 HISTORY — PX: IR RADIOLOGIST EVAL & MGMT: IMG5224

## 2021-07-13 NOTE — Progress Notes (Signed)
Chief Complaint: Patient was consulted remotely today (TeleHealth) for T12 kyphoplasty follow-up at the request of Rilan Eiland K.    Referring Physician(s): Levy Pupa  History of Present Illness: [name] is a 87 y.o. female an extremely pleasant 87 year old female who presents at the kind request of Dr. Levy Pupa for evaluation of a subacute T12 compression fracture.  Approximately 3 weeks ago she developed right lower rib pain which slowly worsening began radiating around her side to the midline of her back.  She denies any specific fall or movement just prior to the onset of pain.  However, approximately 2 days before the onset of pain she was undergoing physical therapy which she reports involved a lot of bending, twisting and other active movements.  At times, the pain is severe and she rates it a 10/10 on a 10 point scale.  She notes that almost any movement that she performs including bending, turning, and changing position from standing to sitting or laying significantly exacerbates her pain.  She tried her best to ignore the pain for approximately 1 week before became too much and she sought medical attention.  An MRI demonstrates an acute fracture of the T12 vertebral body with approximately 50-60% height loss.  There is mild bony retropulsion but no significant impingement on the cord.]   She underwent T12 cement augmentation with kyphoplasty on 06/27/2021.  The procedure was performed without difficulty as an outpatient.  We spoke over the phone today for follow-up evaluation.  [name] is in very good spirits.  She reports that her back pain has completely resolved.  She had some mild soreness at the access site for the first few days following the procedure but this went away quickly.  She has been ambulating around the house with her walker easily.  Additionally, she has begun doing some chores around the house without pain.  She is very satisfied.  Her only  questions are what restrictions she has remaining and whether she can resume her leg exercises.  She denies any redness, pain or drainage at the kyphoplasty entry site.  Past Medical History:  Diagnosis Date   Closed fracture of left distal radius 12/29/2019   DVT (deep venous thrombosis) ([place]) 09/2015   RLE   Dysrhythmia    Afib   GERD (gastroesophageal reflux disease)    Hip fracture ([place]) 12/02/2020   Hypertension    Hypothyroidism    Melanoma ([place]) 06/16/2017   Facial melanoma, removed by Dr. Harvel Quale   Peripheral vascular disease [facility])    peripheral neuropathy    Past Surgical History:  Procedure Laterality Date   ABDOMINAL HYSTERECTOMY     APPENDECTOMY     BACK SURGERY     BREAST SURGERY     left breast biopsy   CARDIOVERSION N/A 05/09/2020   Procedure: CARDIOVERSION;  Surgeon: Skeet Latch, MD;  Location: [facility] ENDOSCOPY;  Service: Cardiovascular;  Laterality: N/A;   COLOSTOMY N/A 01/06/2020   Procedure: Colostomy;  Surgeon: Greer Pickerel, MD;  Location: [place];  Service: General;  Laterality: N/A;   EYE SURGERY     cataract   HARDWARE REMOVAL Left 01/01/2020   Procedure: HARDWARE REMOVAL;  Surgeon: Iran Planas, MD;  Location: [place];  Service: Orthopedics;  Laterality: Left;   HIP ARTHROPLASTY Left 12/03/2020   Procedure: ARTHROPLASTY BIPOLAR HIP (HEMIARTHROPLASTY);  Surgeon: Renette Butters, MD;  Location: [place];  Service: Orthopedics;  Laterality: Left;   HIP ARTHROPLASTY Left    partial  IR KYPHO EA ADDL LEVEL THORACIC OR LUMBAR  06/27/2021   LAPAROTOMY N/A 01/06/2020   Procedure: Exploratory Laparotomy, Open Sigmoid colectomy;  Surgeon: Greer Pickerel, MD;  Location: [place];  Service: General;  Laterality: N/A;   LIPOMA EXCISION Left 04/25/2015   Procedure: EXCISION OF LIPOMA LEFT ARM;  Surgeon: Donnie Mesa, MD;  Location: [place];  Service: General;  Laterality: Left;   LUMBAR LAMINECTOMY/DECOMPRESSION MICRODISCECTOMY  11/20/2011   Procedure: LUMBAR  LAMINECTOMY/DECOMPRESSION MICRODISCECTOMY;  Surgeon: Laurice Record [place];  Location: [facility];  Service: Orthopedics;  Laterality: N/A;  Decompressive Laminectomy L2 to the Sacrum (X-Ray)   LYSIS OF ADHESION N/A 01/17/2021   Procedure: RIGID PROCTOSCOPY;  Surgeon: Michael Boston, MD;  Location: [facility];  Service: General;  Laterality: N/A;   OPEN REDUCTION INTERNAL FIXATION (ORIF) DISTAL RADIAL FRACTURE Left 01/01/2020   Procedure: OPEN REDUCTION INTERNAL FIXATION (ORIF) DISTAL RADIAL FRACTURE;  Surgeon: Iran Planas, MD;  Location: [place];  Service: Orthopedics;  Laterality: Left;   OTHER SURGICAL HISTORY     left wrist surgery - has plate in left wrist    OTHER SURGICAL HISTORY     right knee surgery due to torn cartilage    TONSILLECTOMY     WRIST FRACTURE SURGERY Left    XI ROBOTIC ASSISTED COLOSTOMY TAKEDOWN N/A 01/17/2021   Procedure: XI ROBOTIC ASSISTED OSTOMY TAKEDOWN, LYSIS OF ADHESIONS, RECTOSIGMOID RESECTION, BILATERAL TAP BLOCK;  Surgeon: Michael Boston, MD;  Location: [facility];  Service: General;  Laterality: N/A;    Allergies: Keflex [cephalexin] and Codeine  Medications: Prior to Admission medications   Medication Sig Start Date End Date Taking? Authorizing Provider  acetaminophen (TYLENOL) 500 MG tablet Take 500 mg by mouth at bedtime.    [provider]  apixaban (ELIQUIS) 5 MG TABS tablet Take 1 tablet (5 mg total) by mouth 2 (two) times daily. 06/20/20   Deboraha Sprang, MD  diltiazem (CARDIZEM SR) 60 MG 12 hr capsule Take 1 capsule (60 mg total) by mouth 2 (two) times daily. 01/22/21   [name], MD  docusate sodium (COLACE) 100 MG capsule Take 1 capsule (100 mg total) by mouth 2 (two) times daily. 01/20/20   Love, Ivan Anchors, PA-C  gabapentin (NEURONTIN) 300 MG capsule Take 1 capsule (300 mg total) by mouth at bedtime. 04/19/21   [name], MD  levothyroxine (SYNTHROID) 50 MCG tablet TAKE 1 TABLET BY MOUTH EVERY DAY 06/06/21   [name], MD  losartan  (COZAAR) 50 MG tablet Take 1 tablet (50 mg total) by mouth daily. 05/07/21   [name], MD  nadolol (CORGARD) 80 MG tablet Take 80 mg by mouth daily.    [provider]  pantoprazole (PROTONIX) 40 MG tablet TAKE 1 TABLET BY MOUTH EVERY DAY 04/12/21   [name], MD  predniSONE (DELTASONE) 1 MG tablet Take 4 tablets (4 mg total) by mouth daily with lunch. 12/07/20   Antonieta Pert, MD  sucralfate (CARAFATE) 1 g tablet Take 1 tablet (1 g total) by mouth with breakfast, with lunch, and with evening meal. 03/01/21   [name], MD     Family History  Problem Relation Age of Onset   Cancer Mother        BREAST   COPD Father    Emphysema Father    Cancer Daughter        breast    Social History   Socioeconomic History   Marital status: Widowed  Spouse name: Not on file   Number of children: Not on file   Years of education: Not on file   Highest education level: Not on file  Occupational History   Not on file  Tobacco Use   Smoking status: Never   Smokeless tobacco: Never  Vaping Use   Vaping Use: Never used  Substance and Sexual Activity   Alcohol use: No   Drug use: No   Sexual activity: Never  Other Topics Concern   Not on file  Social History Narrative   Not on file   Social Determinants of Health   Financial Resource Strain: Low Risk    Difficulty of Paying Living Expenses: Not hard at all  Food Insecurity: No Food Insecurity   Worried About Charity fundraiser in the Last Year: Never true   [place] in the Last Year: Never true  Transportation Needs: No Transportation Needs   Lack of Transportation (Medical): No   Lack of Transportation (Non-Medical): No  Physical Activity: Insufficiently Active   Days of Exercise per Week: 2 days   Minutes of Exercise per Session: 60 min  Stress: No Stress Concern Present   Feeling of Stress : Not at all  Social Connections: Moderately Isolated   Frequency of Communication with Friends and  Family: More than three times a week   Frequency of Social Gatherings with Friends and Family: Once a week   Attends Religious Services: More than 4 times per year   Active Member of [employer] or Organizations: No   Attends Archivist Meetings: Never   Marital Status: Widowed   Review of Systems  Review of Systems: A 12 point ROS discussed and pertinent positives are indicated in the HPI above.  All other systems are negative.  Physical Exam No direct physical exam was performed (except for noted visual exam findings with Video Visits).   Vital Signs: There were no vitals taken for this visit.  Imaging: IR KYPHO EA ADDL LEVEL THORACIC OR LUMBAR  Result Date: 06/27/2021 CLINICAL DATA:  87 year old female with a symptomatic T12 compression fracture. She presents for cement augmentation with balloon kyphoplasty. EXAM: FLUOROSCOPIC GUIDED KYPHOPLASTY OF THE T12 VERTEBRAL BODY COMPARISON:  MRI 05/21/2021 MEDICATIONS: As antibiotic prophylaxis, 2 g Ancef was ordered pre-procedure and administered intravenously within 1 hour of incision. ANESTHESIA/SEDATION: Moderate (conscious) sedation was employed during this procedure. A total of Versed 2 mg and Fentanyl 100 mcg was administered intravenously. Moderate Sedation Time: 26 minutes. The patient's level of consciousness and vital signs were monitored continuously by radiology nursing throughout the procedure under my direct supervision. FLUOROSCOPY TIME:  4 minutes, 13 seconds (0000000 mGy) COMPLICATIONS: None immediate. TECHNIQUE: The procedure, risks (including but not limited to bleeding, infection, organ damage), benefits, and alternatives were explained to the patient. Questions regarding the procedure were encouraged and answered. The patient understands and consents to the procedure. The patient was placed prone on the fluoroscopic table. The skin overlying the thoracolumbar region was then prepped and draped in the usual sterile fashion.  Maximal barrier sterile technique was utilized including caps, mask, sterile gowns, sterile gloves, sterile drape, hand hygiene and skin antiseptic. Intravenous Fentanyl and Versed were administered as conscious sedation during continuous cardiorespiratory monitoring by the radiology RN. The right pedicle at T12 was then infiltrated with 1% lidocaine followed by the advancement of a Kyphon trocar needle through the pedicle into the posterior one-third of the vertebral body. Subsequently, the osteo drill was advanced to  the anterior third of the vertebral body. The osteo drill was retracted. Through the working cannula, a Kyphon inflatable bone tamp 15 x 2 was advanced and positioned with the distal marker approximately 5 mm from the anterior aspect of the cortex. Appropriate positioning was confirmed on the AP projection. At this time, the balloon was expanded using contrast via a Kyphon inflation syringe device via micro tubing. Inflations were continued until there was near apposition with the superior end plate. At this time, methylmethacrylate mixture was reconstituted in the Kyphon bone mixing device system. This was then loaded into the cement delivery system and cement was instilled under real-time fluoroscopic guidance. The balloons were deflated and removed followed by the instillation of methylmethacrylate mixture with excellent filling in the AP and lateral projections. No extravasation was noted in the disk spaces or posteriorly into the spinal canal. No epidural venous contamination was seen. The working cannulae and the bone filler were then retrieved and removed. Hemostasis was achieved with manual compression. The patient tolerated the procedure well without immediate postprocedural complication. IMPRESSION: 1. Technically successful T12 vertebral body augmentation using balloon kyphoplasty. 2. Per CMS PQRS reporting requirements (PQRS Measure 24): Given the patient's age of greater than 3 and the  fracture site (hip, distal radius, or spine), the patient should be tested for osteoporosis using DXA, and the appropriate treatment considered based on the DXA results. Signed, Criselda Peaches, MD, [facility] Electronically Signed   By: Jacqulynn Cadet M.D.   On: 06/27/2021 13:51    Labs:  CBC: Recent Labs    01/18/21 0507 01/20/21 1028 04/19/21 1105 05/31/21 1000  WBC 18.1* 11.3* 13.3* 8.2  HGB 9.3* 9.1* 11.2* 11.4*  HCT 30.2* 29.4* 35.5* 36.0  PLT 362 385 375.0 416.0*    COAGS: Recent Labs    12/02/20 1420  INR 1.2    BMP: Recent Labs    10/31/20 1515 11/08/20 1115 11/29/20 1137 12/02/20 1420 12/06/20 0213 12/07/20 0120 01/09/21 1129 01/18/21 0507 01/20/21 1028 04/19/21 1105  NA 142   < > 140   < > 137 137 142 138  --  142  K 5.1   < > 4.5   < > 4.1 4.3 4.2 4.1 3.6 4.1  CL 104   < > 102   < > 103 103 107 108  --  105  CO2 26   < > 25   < > '26 26 28 '$ 20*  --  29  GLUCOSE 108*   < > 91   < > 95 111* 106* 145*  --  83  BUN 13   < > 14   < > '17 18 13 14  '$ --  18  CALCIUM 9.6   < > 9.8   < > 8.7* 8.7* 9.2 8.5*  --  9.2  CREATININE 0.94   < > 1.15*   < > 0.78 0.88 0.75 0.90  --  0.90  GFRNONAA 52*   < > 41*   < > >60 >60 >60 59*  --   --   GFRAA 60  --  47*  --   --   --   --   --   --   --    < > = values in this interval not displayed.    LIVER FUNCTION TESTS: Recent Labs    09/26/20 1416 10/12/20 1738 12/02/20 1420 04/19/21 1105  BILITOT 0.4 0.5 0.6 0.5  AST 13 18  20 12  ALT '11 16 16 11  '$ ALKPHOS 92 80 83 89  PROT 6.3 6.7 6.2* 6.3  ALBUMIN 3.8 3.7 3.6 3.7    TUMOR MARKERS: No results for input(s): AFPTM, CEA, CA199, CHROMGRNA in the last 8760 hours.  Assessment and Plan:  87 year old female doing very well 2 weeks status post T12 cement augmentation with kyphoplasty.  Her back pain has essentially resolved.  She is ambulating well and able to perform her routine activities.  She  continues to walk intermittently with a walker.  We discussed that she has no specific restrictions at this point.  However, she is of course at risk for a new compression fracture now that we know that she has had 1 spontaneous compression fracture.  She understands that she must continue to be very careful and cautious when bending, twisting and lifting.  Additionally, she must be very careful when she walks and be alert for potential obstacles that could result in a trip and fall.  She may resume her normal activities including her leg exercises.  No further follow-up scheduled at this time.  She knows to reach out if she develops recurrent back pain in the future.    Electronically Signed: Criselda Peaches 07/13/2021, 3:10 PM   I spent a total of 15 Minutes in remote  clinical consultation, greater than 50% of which was counseling/coordinating care for T12 compression fracture.    Visit type: Audio only (telephone). Audio (no video) only due to patient preference. Alternative for in-person consultation at [facility], [address], [place], [place]. This visit type was conducted due to national recommendations for restrictions regarding the COVID-19 Pandemic (e.g. social distancing).  This format is felt to be most appropriate for this patient at this time.  All issues noted in this document were discussed and addressed.

## 2021-07-15 ENCOUNTER — Other Ambulatory Visit: Payer: Self-pay | Admitting: Family Medicine

## 2021-07-15 DIAGNOSIS — I10 Essential (primary) hypertension: Secondary | ICD-10-CM

## 2021-07-17 ENCOUNTER — Emergency Department (HOSPITAL_BASED_OUTPATIENT_CLINIC_OR_DEPARTMENT_OTHER): Payer: Medicare PPO

## 2021-07-17 ENCOUNTER — Emergency Department (HOSPITAL_BASED_OUTPATIENT_CLINIC_OR_DEPARTMENT_OTHER)
Admission: EM | Admit: 2021-07-17 | Discharge: 2021-07-17 | Disposition: A | Discharge status: Home / Self Care | Payer: Medicare PPO | Attending: Emergency Medicine | Admitting: Emergency Medicine

## 2021-07-17 ENCOUNTER — Encounter: Payer: Self-pay | Admitting: Family Medicine

## 2021-07-17 ENCOUNTER — Encounter: Payer: Self-pay | Admitting: *Deleted

## 2021-07-17 ENCOUNTER — Other Ambulatory Visit: Payer: Self-pay

## 2021-07-17 DIAGNOSIS — R1031 Right lower quadrant pain: Secondary | ICD-10-CM | POA: Diagnosis not present

## 2021-07-17 DIAGNOSIS — Z79899 Other long term (current) drug therapy: Secondary | ICD-10-CM | POA: Insufficient documentation

## 2021-07-17 DIAGNOSIS — I10 Essential (primary) hypertension: Secondary | ICD-10-CM | POA: Diagnosis not present

## 2021-07-17 DIAGNOSIS — Z96642 Presence of left artificial hip joint: Secondary | ICD-10-CM | POA: Insufficient documentation

## 2021-07-17 DIAGNOSIS — Z85820 Personal history of malignant melanoma of skin: Secondary | ICD-10-CM | POA: Insufficient documentation

## 2021-07-17 DIAGNOSIS — E039 Hypothyroidism, unspecified: Secondary | ICD-10-CM | POA: Insufficient documentation

## 2021-07-17 DIAGNOSIS — Z7901 Long term (current) use of anticoagulants: Secondary | ICD-10-CM | POA: Diagnosis not present

## 2021-07-17 DIAGNOSIS — R109 Unspecified abdominal pain: Secondary | ICD-10-CM

## 2021-07-17 DIAGNOSIS — I714 Abdominal aortic aneurysm, without rupture: Secondary | ICD-10-CM | POA: Diagnosis not present

## 2021-07-17 LAB — CBC WITH DIFFERENTIAL/PLATELET
Abs Immature Granulocytes: 0.02 10*3/uL (ref 0.00–0.07)
Basophils Absolute: 0 10*3/uL (ref 0.0–0.1)
Basophils Relative: 0 %
Eosinophils Absolute: 0.3 10*3/uL (ref 0.0–0.5)
Eosinophils Relative: 3 %
HCT: 38.3 % (ref 36.0–46.0)
Hemoglobin: 11.9 g/dL — ABNORMAL LOW (ref 12.0–15.0)
Immature Granulocytes: 0 %
Lymphocytes Relative: 28 %
Lymphs Abs: 2.9 10*3/uL (ref 0.7–4.0)
MCH: 27 pg (ref 26.0–34.0)
MCHC: 31.1 g/dL (ref 30.0–36.0)
MCV: 87 fL (ref 80.0–100.0)
Monocytes Absolute: 1.1 10*3/uL — ABNORMAL HIGH (ref 0.1–1.0)
Monocytes Relative: 11 %
Neutro Abs: 6.3 10*3/uL (ref 1.7–7.7)
Neutrophils Relative %: 58 %
Platelets: 308 10*3/uL (ref 150–400)
RBC: 4.4 MIL/uL (ref 3.87–5.11)
RDW: 16.7 % — ABNORMAL HIGH (ref 11.5–15.5)
WBC: 10.6 10*3/uL — ABNORMAL HIGH (ref 4.0–10.5)
nRBC: 0 % (ref 0.0–0.2)

## 2021-07-17 LAB — URINALYSIS, ROUTINE W REFLEX MICROSCOPIC
Bilirubin Urine: NEGATIVE
Glucose, UA: NEGATIVE mg/dL
Hgb urine dipstick: NEGATIVE
Ketones, ur: NEGATIVE mg/dL
Nitrite: NEGATIVE
Protein, ur: NEGATIVE mg/dL
Specific Gravity, Urine: 1.009 (ref 1.005–1.030)
pH: 7 (ref 5.0–8.0)

## 2021-07-17 LAB — COMPREHENSIVE METABOLIC PANEL
ALT: 7 U/L (ref 0–44)
AST: 14 U/L — ABNORMAL LOW (ref 15–41)
Albumin: 4 g/dL (ref 3.5–5.0)
Alkaline Phosphatase: 95 U/L (ref 38–126)
Anion gap: 9 (ref 5–15)
BUN: 17 mg/dL (ref 8–23)
CO2: 28 mmol/L (ref 22–32)
Calcium: 9.3 mg/dL (ref 8.9–10.3)
Chloride: 106 mmol/L (ref 98–111)
Creatinine, Ser: 0.84 mg/dL (ref 0.44–1.00)
GFR, Estimated: >60 mL/min (ref >60)
Glucose, Bld: 88 mg/dL (ref 70–99)
Potassium: 3.9 mmol/L (ref 3.5–5.1)
Sodium: 143 mmol/L (ref 135–145)
Total Bilirubin: 0.6 mg/dL (ref 0.3–1.2)
Total Protein: 6.5 g/dL (ref 6.5–8.1)

## 2021-07-17 MED ORDER — IOHEXOL 350 MG/ML SOLN
75.0000 mL | Freq: Once | INTRAVENOUS | Status: AC | PRN
  Administered 2021-07-17: 75 mL via INTRAVENOUS

## 2021-07-17 NOTE — ED Triage Notes (Signed)
Pt to ER with c/o right hip/back pain that started Friday.  States it started Friday in her lower back/buttock and this AM she noted an increase in pain and it has moved to her groin.  Pt denies nausea, difficulty with ambulation or urinary symptoms.

## 2021-07-17 NOTE — ED Provider Notes (Signed)
[facility] EMERGENCY DEPT Provider Note   CSN: [id number] Arrival date & time: 07/17/21  1020     History Chief Complaint  Patient presents with   Hip Pain    [name] is a 87 y.o. female.  The past 4 to 5 days, she has been getting waves of pain in her right flank.  Pain does radiate into her right lower abdomen and groin.  She states that this morning she had a severe bout of pain, and this brought her in.  Pain is the same regardless of whether she is sitting, standing, or moving.  She has had some urinary frequency.  She states that she has also had periods of feeling very hot and having diaphoresis.  She does have a history of recent kyphoplasty for a compression fracture.  She has also had colostomy and subsequent reversal about a year ago.  The history is provided by the patient.  Flank Pain This is a new problem. Episode onset: 4-5 days ago. The problem occurs daily. The problem has not changed since onset.Associated symptoms include abdominal pain. Pertinent negatives include no chest pain, no headaches and no shortness of breath. Nothing aggravates the symptoms. Nothing relieves the symptoms. She has tried nothing for the symptoms. The treatment provided no relief.      Past Medical History:  Diagnosis Date   Closed fracture of left distal radius 12/29/2019   DVT (deep venous thrombosis) ([place]) 09/2015   RLE   Dysrhythmia    Afib   GERD (gastroesophageal reflux disease)    Hip fracture ([place]) 12/02/2020   Hypertension    Hypothyroidism    Melanoma ([place]) 06/16/2017   Facial melanoma, removed by Dr. Harvel Quale   Peripheral vascular disease [facility])    peripheral neuropathy    Patient Active Problem List   Diagnosis Date Noted   Chronic anticoagulation 01/17/2021   Parastomal hernia s/p colostomy takedown & repair 01/17/2021 01/17/2021   Polymyalgia rheumatica ([place]) 01/11/2021   (HFpEF) heart failure with preserved ejection fraction ([place]) 10/27/2020    Injury of extensor or abductor muscle, fascia, or tendon of thumb at forearm level 08/03/2020   Orthostatic hypotension 05/24/2020   Acute blood loss anemia 05/19/2020   Traumatic hematoma of left hip 05/19/2020   Current chronic use of systemic steroids 05/19/2020   Advanced age 62/12/2019   Secondary hypercoagulable state ([place]) 03/08/2020   Hypokalemia 02/15/2020   History of diverticulitis of colon with perforation 01/26/2020   Anemia    Debility 01/14/2020   Physical debility 01/14/2020   Chronic atrial fibrillation ([place])    Recurrent falls 12/29/2019   Positive ANA (antinuclear antibody) 12/29/2019   Osteoarthritis 11/13/2017   Hypertriglyceridemia 01/29/2017   Excessive gas 01/09/2017   Physical exam 07/31/2016   Elevated lipase 03/06/2016   Chest pain 03/06/2016   Osteopenia 02/08/2016   Hypothyroidism 01/26/2016   HTN (hypertension) 01/26/2016   Left leg numbness 10/06/2012    Past Surgical History:  Procedure Laterality Date   ABDOMINAL HYSTERECTOMY     APPENDECTOMY     BACK SURGERY     BREAST SURGERY     left breast biopsy   CARDIOVERSION N/A 05/09/2020   Procedure: CARDIOVERSION;  Surgeon: Skeet Latch, MD;  Location: [facility];  Service: Cardiovascular;  Laterality: N/A;   COLOSTOMY N/A 01/06/2020   Procedure: Colostomy;  Surgeon: Greer Pickerel, MD;  Location: [facility];  Service: General;  Laterality: N/A;   EYE SURGERY     cataract   HARDWARE  REMOVAL Left 01/01/2020   Procedure: HARDWARE REMOVAL;  Surgeon: Iran Planas, MD;  Location: [facility];  Service: Orthopedics;  Laterality: Left;   HIP ARTHROPLASTY Left 12/03/2020   Procedure: ARTHROPLASTY BIPOLAR HIP (HEMIARTHROPLASTY);  Surgeon: Renette Butters, MD;  Location: [facility];  Service: Orthopedics;  Laterality: Left;   HIP ARTHROPLASTY Left    partial   IR KYPHO EA ADDL LEVEL THORACIC OR LUMBAR  06/27/2021   IR RADIOLOGIST EVAL & MGMT  07/13/2021   LAPAROTOMY N/A 01/06/2020   Procedure: Exploratory Laparotomy,  Open Sigmoid colectomy;  Surgeon: Greer Pickerel, MD;  Location: [facility];  Service: General;  Laterality: N/A;   LIPOMA EXCISION Left 04/25/2015   Procedure: EXCISION OF LIPOMA LEFT ARM;  Surgeon: Donnie Mesa, MD;  Location: [facility];  Service: General;  Laterality: Left;   LUMBAR LAMINECTOMY/DECOMPRESSION MICRODISCECTOMY  11/20/2011   Procedure: LUMBAR LAMINECTOMY/DECOMPRESSION MICRODISCECTOMY;  Surgeon: Laurice Record [place];  Location: [facility];  Service: Orthopedics;  Laterality: N/A;  Decompressive Laminectomy L2 to the Sacrum (X-Ray)   LYSIS OF ADHESION N/A 01/17/2021   Procedure: RIGID PROCTOSCOPY;  Surgeon: Michael Boston, MD;  Location: [facility];  Service: General;  Laterality: N/A;   OPEN REDUCTION INTERNAL FIXATION (ORIF) DISTAL RADIAL FRACTURE Left 01/01/2020   Procedure: OPEN REDUCTION INTERNAL FIXATION (ORIF) DISTAL RADIAL FRACTURE;  Surgeon: Iran Planas, MD;  Location: [facility];  Service: Orthopedics;  Laterality: Left;   OTHER SURGICAL HISTORY     left wrist surgery - has plate in left wrist    OTHER SURGICAL HISTORY     right knee surgery due to torn cartilage    TONSILLECTOMY     WRIST FRACTURE SURGERY Left    XI ROBOTIC ASSISTED COLOSTOMY TAKEDOWN N/A 01/17/2021   Procedure: XI ROBOTIC ASSISTED OSTOMY TAKEDOWN, LYSIS OF ADHESIONS, RECTOSIGMOID RESECTION, BILATERAL TAP BLOCK;  Surgeon: Michael Boston, MD;  Location: [facility];  Service: General;  Laterality: N/A;     OB History   No obstetric history on file.     Family History  Problem Relation Age of Onset   Cancer Mother        BREAST   COPD Father    Emphysema Father    Cancer Daughter        breast    Social History   Tobacco Use   Smoking status: Never   Smokeless tobacco: Never  Vaping Use   Vaping Use: Never used  Substance Use Topics   Alcohol use: No   Drug use: No    Home Medications Prior to Admission medications   Medication Sig Start Date End Date Taking? Authorizing Provider  acetaminophen  (TYLENOL) 500 MG tablet Take 500 mg by mouth at bedtime.    [provider]  apixaban (ELIQUIS) 5 MG TABS tablet Take 1 tablet (5 mg total) by mouth 2 (two) times daily. 06/20/20   Deboraha Sprang, MD  diltiazem (CARDIZEM SR) 60 MG 12 hr capsule Take 1 capsule (60 mg total) by mouth 2 (two) times daily. 01/22/21   [name], MD  docusate sodium (COLACE) 100 MG capsule Take 1 capsule (100 mg total) by mouth 2 (two) times daily. 01/20/20   Love, Ivan Anchors, PA-C  gabapentin (NEURONTIN) 300 MG capsule Take 1 capsule (300 mg total) by mouth at bedtime. 04/19/21   [name], MD  levothyroxine (SYNTHROID) 50 MCG tablet TAKE 1 TABLET BY MOUTH EVERY DAY 06/06/21   [name], MD  losartan (COZAAR) 50 MG  tablet Take 1 tablet (50 mg total) by mouth daily. 05/07/21   [name], MD  nadolol (CORGARD) 80 MG tablet Take 80 mg by mouth daily.    [provider]  pantoprazole (PROTONIX) 40 MG tablet TAKE 1 TABLET BY MOUTH EVERY DAY 04/12/21   [name], MD  predniSONE (DELTASONE) 1 MG tablet Take 4 tablets (4 mg total) by mouth daily with lunch. 12/07/20   Antonieta Pert, MD  sucralfate (CARAFATE) 1 g tablet Take 1 tablet (1 g total) by mouth with breakfast, with lunch, and with evening meal. 03/01/21   [name], MD    Allergies    Keflex [cephalexin] and Codeine  Review of Systems   Review of Systems  Constitutional:  Negative for chills and fever.  HENT:  Negative for ear pain and sore throat.   Eyes:  Negative for pain and visual disturbance.  Respiratory:  Negative for cough and shortness of breath.   Cardiovascular:  Negative for chest pain and palpitations.  Gastrointestinal:  Positive for abdominal pain. Negative for vomiting.  Genitourinary:  Positive for flank pain. Negative for dysuria and hematuria.  Musculoskeletal:  Negative for arthralgias and back pain.  Skin:  Negative for color change and rash.  Neurological:  Negative for  seizures, syncope and headaches.  All other systems reviewed and are negative.  Physical Exam Updated Vital Signs BP (!) 189/84 (BP Location: Right Arm)   Pulse 64   Temp 98.2 F (36.8 C) (Oral)   Resp 18   Ht '5\' 6"'$  (1.676 m)   Wt 64.4 kg   SpO2 99%   BMI 22.92 kg/m   Physical Exam Vitals and nursing note reviewed.  HENT:     Head: Normocephalic and atraumatic.  Eyes:     General: No scleral icterus. Pulmonary:     Effort: Pulmonary effort is normal. No respiratory distress.  Abdominal:     Palpations: Abdomen is soft.     Tenderness: There is no abdominal tenderness.  Musculoskeletal:     Cervical back: Normal range of motion.     Comments: Spine is normal to inspection.  There is mild tenderness to palpation at the right SI joint.  No true CVA tenderness.  Range of motion is full and painless.  Hip range of motion is within normal limits.    Skin:    General: Skin is warm and dry.  Neurological:     Mental Status: She is alert and oriented to person, place, and time.     Sensory: Sensation is intact.     Motor: Motor function is intact.  Psychiatric:        Mood and Affect: Mood normal.    ED Results / Procedures / Treatments   Labs (all labs ordered are listed, but only abnormal results are displayed) Labs Reviewed  CBC WITH DIFFERENTIAL/PLATELET - Abnormal; Notable for the following components:      Result Value   WBC 10.6 (*)    Hemoglobin 11.9 (*)    RDW 16.7 (*)    Monocytes Absolute 1.1 (*)    All other components within normal limits  COMPREHENSIVE METABOLIC PANEL - Abnormal; Notable for the following components:   AST 14 (*)    All other components within normal limits  URINALYSIS, ROUTINE W REFLEX MICROSCOPIC - Abnormal; Notable for the following components:   Leukocytes,Ua SMALL (*)    Bacteria, UA RARE (*)    All other components within normal limits  URINE  CULTURE    EKG None  Radiology CT Abdomen Pelvis W Contrast  Result Date:  07/17/2021 CLINICAL DATA:  Right hip and back pain move to groin EXAM: CT ABDOMEN AND PELVIS WITH CONTRAST TECHNIQUE: Multidetector CT imaging of the abdomen and pelvis was performed using the standard protocol following bolus administration of intravenous contrast. CONTRAST:  80m OMNIPAQUE IOHEXOL 350 MG/ML SOLN COMPARISON:  CT abdomen/pelvis 01/11/2020 FINDINGS: Lower chest: The lung bases are clear. The imaged heart is unremarkable. Hepatobiliary: A hypodense lesion in the right hepatic lobe is unchanged, likely reflecting a cyst. There are no other focal lesions. The gallbladder is unremarkable. There is no biliary ductal dilatation. Pancreas: The pancreas demonstrates moderate fatty atrophy. There are no focal lesions or contour abnormalities. There is no main pancreatic ductal dilatation or peripancreatic inflammatory change. Spleen: A small calcified granuloma in the spleen is unchanged. The spleen is otherwise unremarkable. Adrenals/Urinary Tract: The adrenals are unremarkable. A 2.9 cm right renal cyst is unchanged. Smaller hypodense lesions in each kidney are too small to characterize but likely reflect additional cysts. There are no enhancing lesions. There are no stones. There is no hydronephrosis or hydroureter. The bladder is unremarkable. Stomach/Bowel: The stomach is unremarkable. There is no evidence of bowel obstruction. The patient is status post partial bowel resection with chain sutures in the pelvis. The previously seen left lower quadrant ostomy has been reversed. There is no abnormal bowel wall thickening or inflammatory change. There is diverticulosis without evidence of acute diverticulitis. Vascular/Lymphatic: There is extensive calcified atherosclerotic plaque throughout the abdominal aorta. There is focal dilation of the infrarenal abdominal aorta measuring up to 3.0 cm, increased from 2.7 cm in 2021. The major branch vessels are patent. The main portal and splenic veins are patent.  Incidental note is made of a retroaortic left renal vein. There is no abdominal or pelvic lymphadenopathy. Reproductive: The uterus is surgically absent. There is no adnexal mass. Other: There is no ascites or free air. Soft tissue thickening adjacent to the distal colon is likely chronic. Postsurgical changes are noted along the anterior abdominal wall at the midline and in the left lower quadrant. Musculoskeletal: The bones are diffusely demineralized. The patient is status post left hip arthroplasty and kyphoplasty at T12. There is no acute osseous abnormality or aggressive osseous lesion. There is advanced degenerative change in the lumbar spine. IMPRESSION: 1. No evidence of bowel obstruction or other acute finding in the abdomen or pelvis. 2. Status post partial bowel resection without evidence of complication at the anastomotic site. 3. Diverticulosis without evidence of acute diverticulitis. Infrarenal abdominal aortic aneurysm measuring up to 3.0 cm, increased from 2.7 cm in 2021. This recommendation follows ACR consensus guidelines: White Paper of the ACR Incidental Findings Committee II on Vascular Findings. J Am Coll Radiol 2013; 10:789-794. Electronically Signed   By: PValetta MoleM.D.   On: 07/17/2021 13:22    Procedures Procedures   Medications Ordered in ED Medications - No data to display  ED Course  I have reviewed the triage vital signs and the nursing notes.  Pertinent labs & imaging results that were available during my care of the patient were reviewed by me and considered in my medical decision making (see chart for details).    MDM Rules/Calculators/A&P                           LCHANNY KNIBBSis a very well-appearing 87year old patient who presents  with several days of pain in the posterior aspect of her right hip.  Pain radiates to her right abdomen and groin but not down her leg.  Pain comes in waves and is not constant.  Vital signs are normal with the exception of  hypertension.  Exam is overall benign, and she has not required pain medication in the ED.  ED work-up failed to reveal the exact etiology of her pain.  She did have an equivocal urinalysis, and secondary to the complaint of frequency, urine culture will be obtained.  Specifically, CT scan did not reveal acute intra-abdominal pathology.  I discussed the possible etiologies of her symptoms including gastrointestinal pathology such as intermittent cramping or mild colitis, lumbar radiculopathy or other low back pain source, hip pathology which is much less likely given her lack of pain with hip range of motion.  We also talked about the fact that something like shingles could present with pain prior to the presence of a rash.  She was told that if the pain becomes persistent and severe, she should seek repeat ED evaluation.  Otherwise, I do think it is appropriate for her to follow-up with her primary care physician. Final Clinical Impression(s) / ED Diagnoses Final diagnoses:  Flank pain  Right lower quadrant abdominal pain    Rx / DC Orders ED Discharge Orders     None        Arnaldo Natal, MD 07/17/21 1401

## 2021-07-17 NOTE — ED Notes (Signed)
RT Note: Pt. to restroom via wheelchair, no complications.

## 2021-07-18 ENCOUNTER — Other Ambulatory Visit: Payer: Self-pay | Admitting: Family Medicine

## 2021-07-18 DIAGNOSIS — I10 Essential (primary) hypertension: Secondary | ICD-10-CM

## 2021-07-18 LAB — URINE CULTURE

## 2021-07-19 ENCOUNTER — Encounter (HOSPITAL_BASED_OUTPATIENT_CLINIC_OR_DEPARTMENT_OTHER): Payer: Self-pay | Admitting: *Deleted

## 2021-07-19 ENCOUNTER — Telehealth: Payer: Self-pay | Admitting: Family Medicine

## 2021-07-19 ENCOUNTER — Ambulatory Visit: Payer: Medicare PPO | Admitting: Family Medicine

## 2021-07-19 ENCOUNTER — Other Ambulatory Visit: Payer: Self-pay

## 2021-07-19 ENCOUNTER — Emergency Department (HOSPITAL_BASED_OUTPATIENT_CLINIC_OR_DEPARTMENT_OTHER)
Admission: EM | Admit: 2021-07-19 | Discharge: 2021-07-19 | Disposition: A | Discharge status: Home / Self Care | Payer: Medicare PPO | Attending: Emergency Medicine | Admitting: Emergency Medicine

## 2021-07-19 DIAGNOSIS — Z7901 Long term (current) use of anticoagulants: Secondary | ICD-10-CM | POA: Insufficient documentation

## 2021-07-19 DIAGNOSIS — R899 Unspecified abnormal finding in specimens from other organs, systems and tissues: Secondary | ICD-10-CM

## 2021-07-19 DIAGNOSIS — M461 Sacroiliitis, not elsewhere classified: Secondary | ICD-10-CM | POA: Insufficient documentation

## 2021-07-19 DIAGNOSIS — R799 Abnormal finding of blood chemistry, unspecified: Secondary | ICD-10-CM | POA: Insufficient documentation

## 2021-07-19 DIAGNOSIS — I10 Essential (primary) hypertension: Secondary | ICD-10-CM | POA: Insufficient documentation

## 2021-07-19 DIAGNOSIS — M7601 Gluteal tendinitis, right hip: Secondary | ICD-10-CM | POA: Diagnosis not present

## 2021-07-19 DIAGNOSIS — Z96642 Presence of left artificial hip joint: Secondary | ICD-10-CM | POA: Insufficient documentation

## 2021-07-19 DIAGNOSIS — Z79899 Other long term (current) drug therapy: Secondary | ICD-10-CM | POA: Insufficient documentation

## 2021-07-19 DIAGNOSIS — E039 Hypothyroidism, unspecified: Secondary | ICD-10-CM | POA: Diagnosis not present

## 2021-07-19 LAB — URINALYSIS, ROUTINE W REFLEX MICROSCOPIC
Bilirubin Urine: NEGATIVE
Glucose, UA: NEGATIVE mg/dL
Hgb urine dipstick: NEGATIVE
Ketones, ur: NEGATIVE mg/dL
Nitrite: NEGATIVE
Protein, ur: NEGATIVE mg/dL
Specific Gravity, Urine: 1.01 (ref 1.005–1.030)
pH: 6.5 (ref 5.0–8.0)

## 2021-07-19 MED ORDER — IBUPROFEN 400 MG PO TABS
600.0000 mg | ORAL_TABLET | Freq: Once | ORAL | Status: AC
  Administered 2021-07-19: 600 mg via ORAL
  Filled 2021-07-19: qty 1

## 2021-07-19 NOTE — ED Provider Notes (Signed)
[facility] EMERG[facility] DEPT Provider Note   CSN: [id number] Arrival date & time: 07/19/21  [id number]     History Chief Complaint  Patient presents with   Follow-up    [name] is a 87 y.o. female.  Pt is a 87 yo female presenting for abnormal lab results. Pt presented to ED two days ago on 9/6 for right hip/buttocks pain that radiated to the left lower quadrant that was described as severe. Pt had stable labs, no signs/symptoms of sepsis, negative CT abdomen and pelvis, and negative UA results. Pt saw on my chart that urine cultures were positive for "multiple flora/bacteria" and came to ED. States pain has improved significantly but not resolved. Denies dysuria, increased frequency, or urgency.  Of note, BP 207/84. Pt states she forgot to take her antihypertensives today. Offered home meds in ED and declined.   The history is provided by the patient. No language interpreter was used.      Past Medical History:  Diagnosis Date   Closed fracture of left distal radius 12/29/2019   DVT (deep venous thrombosis) ([place]) 09/2015   RLE   Dysrhythmia    Afib   GERD (gastroesophageal reflux disease)    Hip fracture ([place]) 12/02/2020   Hypertension    Hypothyroidism    Melanoma ([place]) 06/16/2017   Facial melanoma, removed by Dr. Harvel Quale   Peripheral vascular disease [facility])    peripheral neuropathy    Patient Active Problem List   Diagnosis Date Noted   Chronic anticoagulation 01/17/2021   Parastomal hernia s/p colostomy takedown & repair 01/17/2021 01/17/2021   Polymyalgia rheumatica ([place]) 01/11/2021   (HFpEF) heart failure with preserved ejection fraction ([place]) 10/27/2020   Injury of extensor or abductor muscle, fascia, or tendon of thumb at forearm level 08/03/2020   Orthostatic hypotension 05/24/2020   Acute blood loss anemia 05/19/2020   Traumatic hematoma of left hip 05/19/2020   Current chronic use of systemic steroids 05/19/2020   Advanced age 59/12/2019    Secondary hypercoagulable state ([place]) 03/08/2020   Hypokalemia 02/15/2020   History of diverticulitis of colon with perforation 01/26/2020   Anemia    Debility 01/14/2020   Physical debility 01/14/2020   Chronic atrial fibrillation ([place])    Recurrent falls 12/29/2019   Positive ANA (antinuclear antibody) 12/29/2019   Osteoarthritis 11/13/2017   Hypertriglyceridemia 01/29/2017   Excessive gas 01/09/2017   Physical exam 07/31/2016   Elevated lipase 03/06/2016   Chest pain 03/06/2016   Osteopenia 02/08/2016   Hypothyroidism 01/26/2016   HTN (hypertension) 01/26/2016   Left leg numbness 10/06/2012    Past Surgical History:  Procedure Laterality Date   ABDOMINAL HYSTERECTOMY     APPENDECTOMY     BACK SURGERY     BREAST SURGERY     left breast biopsy   CARDIOVERSION N/A 05/09/2020   Procedure: CARDIOVERSION;  Surgeon: Skeet Latch, MD;  Location: [facility];  Service: Cardiovascular;  Laterality: N/A;   COLOSTOMY N/A 01/06/2020   Procedure: Colostomy;  Surgeon: Greer Pickerel, MD;  Location: [place];  Service: General;  Laterality: N/A;   EYE SURGERY     cataract   HARDWARE REMOVAL Left 01/01/2020   Procedure: HARDWARE REMOVAL;  Surgeon: Iran Planas, MD;  Location: [facility];  Service: Orthopedics;  Laterality: Left;   HIP ARTHROPLASTY Left 12/03/2020   Procedure: ARTHROPLASTY BIPOLAR HIP (HEMIARTHROPLASTY);  Surgeon: Renette Butters, MD;  Location: [facility];  Service: Orthopedics;  Laterality: Left;   HIP ARTHROPLASTY Left  partial   IR KYPHO EA ADDL LEVEL THORACIC OR LUMBAR  06/27/2021   IR RADIOLOGIST EVAL & MGMT  07/13/2021   LAPAROTOMY N/A 01/06/2020   Procedure: Exploratory Laparotomy, Open Sigmoid colectomy;  Surgeon: Greer Pickerel, MD;  Location: [facility];  Service: General;  Laterality: N/A;   LIPOMA EXCISION Left 04/25/2015   Procedure: EXCISION OF LIPOMA LEFT ARM;  Surgeon: Donnie Mesa, MD;  Location: [facility];  Service: General;  Laterality: Left;   LUMBAR  LAMINECTOMY/DECOMPRESSION MICRODISCECTOMY  11/20/2011   Procedure: LUMBAR LAMINECTOMY/DECOMPRESSION MICRODISCECTOMY;  Surgeon: Laurice Record [place];  Location: [facility];  Service: Orthopedics;  Laterality: N/A;  Decompressive Laminectomy L2 to the Sacrum (X-Ray)   LYSIS OF ADHESION N/A 01/17/2021   Procedure: RIGID PROCTOSCOPY;  Surgeon: Michael Boston, MD;  Location: [facility];  Service: General;  Laterality: N/A;   OPEN REDUCTION INTERNAL FIXATION (ORIF) DISTAL RADIAL FRACTURE Left 01/01/2020   Procedure: OPEN REDUCTION INTERNAL FIXATION (ORIF) DISTAL RADIAL FRACTURE;  Surgeon: Iran Planas, MD;  Location: [place];  Service: Orthopedics;  Laterality: Left;   OTHER SURGICAL HISTORY     left wrist surgery - has plate in left wrist    OTHER SURGICAL HISTORY     right knee surgery due to torn cartilage    TONSILLECTOMY     WRIST FRACTURE SURGERY Left    XI ROBOTIC ASSISTED COLOSTOMY TAKEDOWN N/A 01/17/2021   Procedure: XI ROBOTIC ASSISTED OSTOMY TAKEDOWN, LYSIS OF ADHESIONS, RECTOSIGMOID RESECTION, BILATERAL TAP BLOCK;  Surgeon: Michael Boston, MD;  Location: [facility];  Service: General;  Laterality: N/A;     OB History   No obstetric history on file.     Family History  Problem Relation Age of Onset   Cancer Mother        BREAST   COPD Father    Emphysema Father    Cancer Daughter        breast    Social History   Tobacco Use   Smoking status: Never   Smokeless tobacco: Never  Vaping Use   Vaping Use: Never used  Substance Use Topics   Alcohol use: No   Drug use: No    Home Medications Prior to Admission medications   Medication Sig Start Date End Date Taking? Authorizing Provider  apixaban (ELIQUIS) 5 MG TABS tablet Take 1 tablet (5 mg total) by mouth 2 (two) times daily. 06/20/20  Yes Deboraha Sprang, MD  diltiazem (CARDIZEM SR) 60 MG 12 hr capsule TAKE 1 CAPSULE BY MOUTH 2 TIMES DAILY. 07/17/21  Yes [name], MD  docusate sodium (COLACE) 100 MG capsule Take 1 capsule (100 mg  total) by mouth 2 (two) times daily. 01/20/20  Yes Love, Ivan Anchors, PA-C  gabapentin (NEURONTIN) 300 MG capsule Take 1 capsule (300 mg total) by mouth at bedtime. 04/19/21  Yes [name], MD  levothyroxine (SYNTHROID) 50 MCG tablet TAKE 1 TABLET BY MOUTH EVERY DAY 06/06/21  Yes [name], MD  losartan (COZAAR) 50 MG tablet Take 1 tablet (50 mg total) by mouth daily. 05/07/21  Yes [name], MD  nadolol (CORGARD) 80 MG tablet Take 80 mg by mouth daily.   Yes [provider]  pantoprazole (PROTONIX) 40 MG tablet TAKE 1 TABLET BY MOUTH EVERY DAY 04/12/21  Yes [name], MD  predniSONE (DELTASONE) 1 MG tablet Take 4 tablets (4 mg total) by mouth daily with lunch. 12/07/20  Yes Antonieta Pert, MD  acetaminophen (TYLENOL) 500 MG  tablet Take 500 mg by mouth at bedtime.    [provider]    Allergies    Keflex [cephalexin] and Codeine  Review of Systems   Review of Systems  Constitutional:  Negative for chills and fever.  HENT:  Negative for ear pain and sore throat.   Eyes:  Negative for pain and visual disturbance.  Respiratory:  Negative for cough and shortness of breath.   Cardiovascular:  Negative for chest pain and palpitations.  Gastrointestinal:  Negative for abdominal pain and vomiting.  Genitourinary:  Negative for dysuria and hematuria.  Musculoskeletal:  Negative for arthralgias and back pain.  Skin:  Negative for color change and rash.  Neurological:  Negative for seizures and syncope.  All other systems reviewed and are negative.  Physical Exam Updated Vital Signs BP (!) 207/84 (BP Location: Right Arm)   Pulse 61   Temp 97.8 F (36.6 C)   Resp 16   Ht '5\' 6"'$  (1.676 m)   Wt 64.4 kg   SpO2 98%   BMI 22.92 kg/m   Physical Exam Vitals and nursing note reviewed.  Constitutional:      General: She is not in acute distress.    Appearance: She is well-developed.  HENT:     Head: Normocephalic and atraumatic.  Eyes:      Conjunctiva/sclera: Conjunctivae normal.  Cardiovascular:     Rate and Rhythm: Normal rate and regular rhythm.     Heart sounds: No murmur heard. Pulmonary:     Effort: Pulmonary effort is normal. No respiratory distress.     Breath sounds: Normal breath sounds.  Abdominal:     Palpations: Abdomen is soft.     Tenderness: There is no abdominal tenderness.  Musculoskeletal:     Cervical back: Neck supple. No bony tenderness.     Thoracic back: No bony tenderness.     Lumbar back: No bony tenderness.     Comments: Right sided reproducible tenderness at sacral illiac joint. No rashes or ecchymosis.   Skin:    General: Skin is warm and dry.  Neurological:     Mental Status: She is alert.    ED Results / Procedures / Treatments   Labs (all labs ordered are listed, but only abnormal results are displayed) Labs Reviewed  URINALYSIS, ROUTINE W REFLEX MICROSCOPIC    EKG None  Radiology CT Abdomen Pelvis W Contrast  Result Date: 07/17/2021 CLINICAL DATA:  Right hip and back pain move to groin EXAM: CT ABDOMEN AND PELVIS WITH CONTRAST TECHNIQUE: Multidetector CT imaging of the abdomen and pelvis was performed using the standard protocol following bolus administration of intravenous contrast. CONTRAST:  51m OMNIPAQUE IOHEXOL 350 MG/ML SOLN COMPARISON:  CT abdomen/pelvis 01/11/2020 FINDINGS: Lower chest: The lung bases are clear. The imaged heart is unremarkable. Hepatobiliary: A hypodense lesion in the right hepatic lobe is unchanged, likely reflecting a cyst. There are no other focal lesions. The gallbladder is unremarkable. There is no biliary ductal dilatation. Pancreas: The pancreas demonstrates moderate fatty atrophy. There are no focal lesions or contour abnormalities. There is no main pancreatic ductal dilatation or peripancreatic inflammatory change. Spleen: A small calcified granuloma in the spleen is unchanged. The spleen is otherwise unremarkable. Adrenals/Urinary Tract: The adrenals  are unremarkable. A 2.9 cm right renal cyst is unchanged. Smaller hypodense lesions in each kidney are too small to characterize but likely reflect additional cysts. There are no enhancing lesions. There are no stones. There is no hydronephrosis or hydroureter. The bladder  is unremarkable. Stomach/Bowel: The stomach is unremarkable. There is no evidence of bowel obstruction. The patient is status post partial bowel resection with chain sutures in the pelvis. The previously seen left lower quadrant ostomy has been reversed. There is no abnormal bowel wall thickening or inflammatory change. There is diverticulosis without evidence of acute diverticulitis. Vascular/Lymphatic: There is extensive calcified atherosclerotic plaque throughout the abdominal aorta. There is focal dilation of the infrarenal abdominal aorta measuring up to 3.0 cm, increased from 2.7 cm in 2021. The major branch vessels are patent. The main portal and splenic veins are patent. Incidental note is made of a retroaortic left renal vein. There is no abdominal or pelvic lymphadenopathy. Reproductive: The uterus is surgically absent. There is no adnexal mass. Other: There is no ascites or free air. Soft tissue thickening adjacent to the distal colon is likely chronic. Postsurgical changes are noted along the anterior abdominal wall at the midline and in the left lower quadrant. Musculoskeletal: The bones are diffusely demineralized. The patient is status post left hip arthroplasty and kyphoplasty at T12. There is no acute osseous abnormality or aggressive osseous lesion. There is advanced degenerative change in the lumbar spine. IMPRESSION: 1. No evidence of bowel obstruction or other acute finding in the abdomen or pelvis. 2. Status post partial bowel resection without evidence of complication at the anastomotic site. 3. Diverticulosis without evidence of acute diverticulitis. Infrarenal abdominal aortic aneurysm measuring up to 3.0 cm, increased  from 2.7 cm in 2021. This recommendation follows ACR consensus guidelines: White Paper of the ACR Incidental Findings Committee II on Vascular Findings. J Am Coll Radiol 2013; 10:789-794. Electronically Signed   By: Valetta Mole M.D.   On: 07/17/2021 13:22    Procedures Procedures   Medications Ordered in ED Medications  ibuprofen (ADVIL) tablet 600 mg (has no administration in time range)    ED Course  I have reviewed the triage vital signs and the nursing notes.  Pertinent labs & imaging results that were available during my care of the patient were reviewed by me and considered in my medical decision making (see chart for details).    MDM Rules/Calculators/A&P                          11:55 AM 87 yo female presenting for abnormal lab results.   Pt is Aox3, no acute distress, afebrile, with stable vitals. Physical exam demonstrates no right sided sacral iliac tenderness. Pt had CT imaging yesterday demonstrating no fractures or underlying abscesses. Pain radiated to the abdomen at that time. Currently no abdominal pain. UA was negative yesterday but sent for culture results. Pt denies dysuria, hematuria, increased frequency, or urgency. Pt saw on my chart that results were positive for many flora. Pt has undiagnostic study and likely secondary to poor catch. Recommended for repeat study. UA today demonstrates no UTI.   Patient in no distress and overall condition improved here in the ED. Sacral iliac pain likely musculoskeletal in nature. Patient recommended for tylenol for pain at home. Detailed discussions were had with the patient regarding current findings, and need for close f/u with PCP or on call doctor. The patient has been instructed to return immediately if the symptoms worsen in any way for re-evaluation. Patient verbalized understanding and is in agreement with current care plan. All questions answered prior to discharge.        Final Clinical Impression(s) / ED  Diagnoses Final diagnoses:  Abnormal laboratory  test    Rx / DC Orders ED Discharge Orders     None        Lianne Cure, DO [phone]

## 2021-07-19 NOTE — Discharge Instructions (Signed)
You will receive a call for any abnormal urine results. If you are positive for a urinary tract infection I will call you and call over antibiotics to your pharmacy.

## 2021-07-19 NOTE — ED Notes (Signed)
Pt's BP elevated. Elba Barman - RN aware. Pt's BP retaken.

## 2021-07-19 NOTE — ED Triage Notes (Signed)
Pt was here on the 6th for back pain radiating to her groin came back here today d/t urine result and she needed to have a follow up but her PCP is out of town.  Pain to right lower abdomen came back.

## 2021-07-19 NOTE — Telephone Encounter (Signed)
The original prescription was discontinued on 05/01/2021 by Ulice Brilliant T, CMA for the following reason: Error. Renewing this prescription may not be appropriate.  I don't see in the chart when it was continued. Please advise.

## 2021-07-23 DIAGNOSIS — S7292XA Unspecified fracture of left femur, initial encounter for closed fracture: Secondary | ICD-10-CM | POA: Diagnosis not present

## 2021-07-23 DIAGNOSIS — M6281 Muscle weakness (generalized): Secondary | ICD-10-CM | POA: Diagnosis not present

## 2021-07-23 DIAGNOSIS — S7292XS Unspecified fracture of left femur, sequela: Secondary | ICD-10-CM | POA: Diagnosis not present

## 2021-07-23 DIAGNOSIS — Z9181 History of falling: Secondary | ICD-10-CM | POA: Diagnosis not present

## 2021-07-23 DIAGNOSIS — I10 Essential (primary) hypertension: Secondary | ICD-10-CM | POA: Diagnosis not present

## 2021-07-23 DIAGNOSIS — R2689 Other abnormalities of gait and mobility: Secondary | ICD-10-CM | POA: Diagnosis not present

## 2021-07-25 NOTE — Telephone Encounter (Signed)
Ok to fill Nadalol '80mg'$  1 tab daily, #90, 1 refill

## 2021-07-25 NOTE — Telephone Encounter (Signed)
Patient still needs nadalol

## 2021-07-26 ENCOUNTER — Other Ambulatory Visit: Payer: Self-pay

## 2021-07-26 MED ORDER — NADOLOL 80 MG PO TABS: 80.0000 mg | ORAL_TABLET | Freq: Every day | ORAL | 1 refills | Status: DC

## 2021-07-26 NOTE — Telephone Encounter (Signed)
Refill sent per Dr Virgil Benedict verbal

## 2021-07-30 ENCOUNTER — Inpatient Hospital Stay: Payer: Medicare PPO | Admitting: Family Medicine

## 2021-08-01 DIAGNOSIS — M6281 Muscle weakness (generalized): Secondary | ICD-10-CM | POA: Diagnosis not present

## 2021-08-01 DIAGNOSIS — S7292XA Unspecified fracture of left femur, initial encounter for closed fracture: Secondary | ICD-10-CM | POA: Diagnosis not present

## 2021-08-01 DIAGNOSIS — S7292XS Unspecified fracture of left femur, sequela: Secondary | ICD-10-CM | POA: Diagnosis not present

## 2021-08-01 DIAGNOSIS — R2689 Other abnormalities of gait and mobility: Secondary | ICD-10-CM | POA: Diagnosis not present

## 2021-08-01 DIAGNOSIS — Z9181 History of falling: Secondary | ICD-10-CM | POA: Diagnosis not present

## 2021-08-01 DIAGNOSIS — I10 Essential (primary) hypertension: Secondary | ICD-10-CM | POA: Diagnosis not present

## 2021-08-22 DIAGNOSIS — S7292XA Unspecified fracture of left femur, initial encounter for closed fracture: Secondary | ICD-10-CM | POA: Diagnosis not present

## 2021-08-22 DIAGNOSIS — M6281 Muscle weakness (generalized): Secondary | ICD-10-CM | POA: Diagnosis not present

## 2021-08-22 DIAGNOSIS — I10 Essential (primary) hypertension: Secondary | ICD-10-CM | POA: Diagnosis not present

## 2021-08-22 DIAGNOSIS — R2689 Other abnormalities of gait and mobility: Secondary | ICD-10-CM | POA: Diagnosis not present

## 2021-08-22 DIAGNOSIS — Z9181 History of falling: Secondary | ICD-10-CM | POA: Diagnosis not present

## 2021-08-22 DIAGNOSIS — S7292XS Unspecified fracture of left femur, sequela: Secondary | ICD-10-CM | POA: Diagnosis not present

## 2021-08-23 ENCOUNTER — Ambulatory Visit (INDEPENDENT_AMBULATORY_CARE_PROVIDER_SITE_OTHER): Payer: Medicare PPO | Admitting: Family Medicine

## 2021-08-23 ENCOUNTER — Other Ambulatory Visit: Payer: Self-pay

## 2021-08-23 DIAGNOSIS — Z23 Encounter for immunization: Secondary | ICD-10-CM | POA: Diagnosis not present

## 2021-09-05 ENCOUNTER — Other Ambulatory Visit: Payer: Self-pay

## 2021-09-05 MED ORDER — APIXABAN 5 MG PO TABS: 5.0000 mg | ORAL_TABLET | Freq: Two times a day (BID) | ORAL | 3 refills | Status: DC

## 2021-09-05 NOTE — Telephone Encounter (Signed)
Prescription refill request for Eliquis received. Indication: Afib  Last office visit: 05/01/21 Caryl Comes)  Scr: 0.84 (07/17/21) Age: 87 Weight: 64.4kg  Appropriate dose and refill sent to requested pharmacy.

## 2021-09-10 DIAGNOSIS — H34811 Central retinal vein occlusion, right eye, with macular edema: Secondary | ICD-10-CM | POA: Diagnosis not present

## 2021-09-19 DIAGNOSIS — H34811 Central retinal vein occlusion, right eye, with macular edema: Secondary | ICD-10-CM | POA: Diagnosis not present

## 2021-09-19 DIAGNOSIS — H353132 Nonexudative age-related macular degeneration, bilateral, intermediate dry stage: Secondary | ICD-10-CM | POA: Diagnosis not present

## 2021-09-21 DIAGNOSIS — M79641 Pain in right hand: Secondary | ICD-10-CM | POA: Diagnosis not present

## 2021-09-21 DIAGNOSIS — M353 Polymyalgia rheumatica: Secondary | ICD-10-CM | POA: Diagnosis not present

## 2021-09-21 DIAGNOSIS — M79642 Pain in left hand: Secondary | ICD-10-CM | POA: Diagnosis not present

## 2021-09-21 DIAGNOSIS — S22000A Wedge compression fracture of unspecified thoracic vertebra, initial encounter for closed fracture: Secondary | ICD-10-CM | POA: Diagnosis not present

## 2021-09-21 DIAGNOSIS — M810 Age-related osteoporosis without current pathological fracture: Secondary | ICD-10-CM | POA: Diagnosis not present

## 2021-09-21 DIAGNOSIS — G5601 Carpal tunnel syndrome, right upper limb: Secondary | ICD-10-CM | POA: Diagnosis not present

## 2021-09-21 DIAGNOSIS — Z7952 Long term (current) use of systemic steroids: Secondary | ICD-10-CM | POA: Diagnosis not present

## 2021-09-21 DIAGNOSIS — M150 Primary generalized (osteo)arthritis: Secondary | ICD-10-CM | POA: Diagnosis not present

## 2021-09-21 DIAGNOSIS — Z6823 Body mass index (BMI) 23.0-23.9, adult: Secondary | ICD-10-CM | POA: Diagnosis not present

## 2021-10-09 ENCOUNTER — Other Ambulatory Visit: Payer: Self-pay | Admitting: Family Medicine

## 2021-10-09 DIAGNOSIS — K219 Gastro-esophageal reflux disease without esophagitis: Secondary | ICD-10-CM

## 2021-10-17 DIAGNOSIS — H34811 Central retinal vein occlusion, right eye, with macular edema: Secondary | ICD-10-CM | POA: Diagnosis not present

## 2021-10-19 DIAGNOSIS — M7062 Trochanteric bursitis, left hip: Secondary | ICD-10-CM | POA: Diagnosis not present

## 2021-10-19 DIAGNOSIS — M545 Low back pain, unspecified: Secondary | ICD-10-CM | POA: Diagnosis not present

## 2021-10-24 ENCOUNTER — Ambulatory Visit (INDEPENDENT_AMBULATORY_CARE_PROVIDER_SITE_OTHER): Payer: Medicare PPO | Admitting: Family Medicine

## 2021-10-24 ENCOUNTER — Encounter: Payer: Self-pay | Admitting: Family Medicine

## 2021-10-24 VITALS — BP 126/84 | HR 74 | Temp 97.5°F | Resp 16 | Ht 66.0 in | Wt 143.6 lb

## 2021-10-24 DIAGNOSIS — I10 Essential (primary) hypertension: Secondary | ICD-10-CM | POA: Diagnosis not present

## 2021-10-24 DIAGNOSIS — M810 Age-related osteoporosis without current pathological fracture: Secondary | ICD-10-CM

## 2021-10-24 DIAGNOSIS — Z0000 Encounter for general adult medical examination without abnormal findings: Secondary | ICD-10-CM | POA: Diagnosis not present

## 2021-10-24 LAB — BASIC METABOLIC PANEL
BUN: 26 mg/dL — ABNORMAL HIGH (ref 6–23)
CO2: 28 mEq/L (ref 19–32)
Calcium: 9.6 mg/dL (ref 8.4–10.5)
Chloride: 104 mEq/L (ref 96–112)
Creatinine, Ser: 0.96 mg/dL (ref 0.40–1.20)
GFR: 49.9 mL/min — ABNORMAL LOW (ref >60.00)
Glucose, Bld: 115 mg/dL — ABNORMAL HIGH (ref 70–99)
Potassium: 4.3 mEq/L (ref 3.5–5.1)
Sodium: 139 mEq/L (ref 135–145)

## 2021-10-24 LAB — CBC WITH DIFFERENTIAL/PLATELET
Basophils Absolute: 0 10*3/uL (ref 0.0–0.1)
Basophils Relative: 0.3 % (ref 0.0–3.0)
Eosinophils Absolute: 0.1 10*3/uL (ref 0.0–0.7)
Eosinophils Relative: 1.1 % (ref 0.0–5.0)
HCT: 37.2 % (ref 36.0–46.0)
Hemoglobin: 12 g/dL (ref 12.0–15.0)
Lymphocytes Relative: 22.7 % (ref 12.0–46.0)
Lymphs Abs: 2 10*3/uL (ref 0.7–4.0)
MCHC: 32.2 g/dL (ref 30.0–36.0)
MCV: 87.6 fl (ref 78.0–100.0)
Monocytes Absolute: 0.9 10*3/uL (ref 0.1–1.0)
Monocytes Relative: 9.6 % (ref 3.0–12.0)
Neutro Abs: 6 10*3/uL (ref 1.4–7.7)
Neutrophils Relative %: 66.3 % (ref 43.0–77.0)
Platelets: 315 10*3/uL (ref 150.0–400.0)
RBC: 4.24 Mil/uL (ref 3.87–5.11)
RDW: 15.6 % — ABNORMAL HIGH (ref 11.5–15.5)
WBC: 9 10*3/uL (ref 4.0–10.5)

## 2021-10-24 LAB — LIPID PANEL
Cholesterol: 184 mg/dL (ref 0–200)
HDL: 63.6 mg/dL (ref >39.00)
LDL Cholesterol: 86 mg/dL (ref 0–99)
NonHDL: 119.9
Total CHOL/HDL Ratio: 3
Triglycerides: 168 mg/dL — ABNORMAL HIGH (ref 0.0–149.0)
VLDL: 33.6 mg/dL (ref 0.0–40.0)

## 2021-10-24 LAB — HEPATIC FUNCTION PANEL
ALT: 10 U/L (ref 0–35)
AST: 13 U/L (ref 0–37)
Albumin: 4.2 g/dL (ref 3.5–5.2)
Alkaline Phosphatase: 97 U/L (ref 39–117)
Bilirubin, Direct: 0.1 mg/dL (ref 0.0–0.3)
Total Bilirubin: 0.6 mg/dL (ref 0.2–1.2)
Total Protein: 6.7 g/dL (ref 6.0–8.3)

## 2021-10-24 LAB — VITAMIN D 25 HYDROXY (VIT D DEFICIENCY, FRACTURES): VITD: 19.04 ng/mL — ABNORMAL LOW (ref 30.00–100.00)

## 2021-10-24 LAB — TSH: TSH: 1.15 u[IU]/mL (ref 0.35–5.50)

## 2021-10-24 NOTE — Assessment & Plan Note (Signed)
Chronic problem.  Check Vit D and replete prn. 

## 2021-10-24 NOTE — Progress Notes (Signed)
° °  Subjective:    Patient ID: [name], female    DOB: 28-Nov-1922, 87 y.o.   MRN: [id number]  HPI CPE- UTD on Tdap, PNA, flu, DEXA.  Patient Care Team    Relationship Specialty Notifications Start End  [name], MD PCP - General Family Medicine  01/26/16   Deboraha Sprang, MD PCP - Cardiology Cardiology  06/05/21   Susa Day, MD Consulting Physician Orthopedic Surgery  01/25/16   Druscilla Brownie, MD Consulting Physician Dermatology  08/07/17   Iran Planas, MD Consulting Physician Orthopedic Surgery  01/26/20   Michael Boston, MD Consulting Physician Colon and Rectal Surgery  08/24/20   Gavin Pound, MD Consulting Physician Rheumatology  08/24/20   Deboraha Sprang, MD Consulting Physician Cardiology  08/24/20     Health Maintenance  Topic Date Due   COVID-19 Vaccine (4 - Booster for Pfizer series) 11/10/2021 (Originally 10/23/2020)   Zoster Vaccines- Shingrix (2 of 2) 11/10/2021 (Originally 05/28/2013)   TETANUS/TDAP  04/03/2023   Pneumonia Vaccine 52+ Years old  Completed   INFLUENZA VACCINE  Completed   DEXA SCAN  Completed   HPV VACCINES  Aged Out      Review of Systems Patient reports no vision changes, adenopathy,fever, weight change,  persistant/recurrent hoarseness , swallowing issues, chest pain, palpitations, edema, persistant/recurrent cough, hemoptysis, gastrointestinal bleeding (melena, rectal bleeding), abdominal pain, bowel changes, GU symptoms (dysuria, hematuria), Gyn symptoms (abnormal  bleeding, pain),  syncope, focal weakness, memory loss, skin/hair/nail changes, abnormal bruising or bleeding, anxiety, or depression.   + decreased hearing + GERD + SOB w/ exertion + urinary incontinence + L leg numbness since back surgery  This visit occurred during the SARS-CoV-2 public health emergency.  Safety protocols were in place, including screening questions prior to the visit, additional usage of staff PPE, and extensive cleaning of exam room while  observing appropriate contact time as indicated for disinfecting solutions.      Objective:   Physical Exam General Appearance:    Alert, cooperative, no distress, appears stated age  Head:    Normocephalic, without obvious abnormality, atraumatic  Eyes:    PERRL, conjunctiva/corneas clear, EOM's intact, fundi    benign, both eyes  Ears:    Normal TM's and external ear canals, both ears  Nose:   Deferred due to COVID  Throat:   Neck:   Supple, symmetrical, trachea midline, no adenopathy;    Thyroid: no enlargement/tenderness/nodules  Back:     Symmetric, no curvature, ROM normal, no CVA tenderness  Lungs:     Clear to auscultation bilaterally, respirations unlabored  Chest Wall:    No tenderness or deformity   Heart:    Regular rate and rhythm, S1 and S2 normal, no murmur, rub   or gallop  Breast Exam:    Deferred  Abdomen:     Soft, non-tender, bowel sounds active all four quadrants,    no masses, no organomegaly  Genitalia:    Deferred  Rectal:    Extremities:   Extremities normal, atraumatic, no cyanosis or edema  Pulses:   2+ and symmetric all extremities  Skin:   Skin color, texture, turgor normal, no rashes or lesions  Lymph nodes:   Cervical, supraclavicular, and axillary nodes normal  Neurologic:   CNII-XII intact, normal strength, sensation and reflexes    throughout          Assessment & Plan:

## 2021-10-24 NOTE — Patient Instructions (Signed)
Follow up in 6 months to recheck BP We'll notify you of your lab results and make any changes if needed Keep up the good work!  You look great! Call with any questions or concerns Stay Safe!  Stay Healthy! Happy Holidays!!!

## 2021-10-24 NOTE — Assessment & Plan Note (Signed)
Chronic problem.  Well controlled today.  Currently asymptomatic.  Check labs.  No anticipated med changes.

## 2021-10-24 NOTE — Assessment & Plan Note (Signed)
Pt's PE unchanged from previous.  UTD on Tdap, PNA, flu, DEXA.  Doing remarkably well given her age.  Check labs.  Anticipatory guidance provided.

## 2021-10-25 ENCOUNTER — Other Ambulatory Visit: Payer: Self-pay

## 2021-10-25 DIAGNOSIS — R7989 Other specified abnormal findings of blood chemistry: Secondary | ICD-10-CM

## 2021-10-25 MED ORDER — VITAMIN D (ERGOCALCIFEROL) 1.25 MG (50000 UNIT) PO CAPS: 50000.0000 [IU] | ORAL_CAPSULE | ORAL | 0 refills | Status: DC

## 2021-10-29 ENCOUNTER — Encounter: Payer: Self-pay | Admitting: Family Medicine

## 2021-11-11 ENCOUNTER — Other Ambulatory Visit: Payer: Self-pay | Admitting: Family Medicine

## 2021-11-14 DIAGNOSIS — H34811 Central retinal vein occlusion, right eye, with macular edema: Secondary | ICD-10-CM | POA: Diagnosis not present

## 2021-11-26 ENCOUNTER — Other Ambulatory Visit: Payer: Self-pay | Admitting: Family Medicine

## 2021-12-06 ENCOUNTER — Other Ambulatory Visit: Payer: Self-pay | Admitting: Family Medicine

## 2021-12-10 ENCOUNTER — Ambulatory Visit: Payer: Medicare PPO | Admitting: Family Medicine

## 2021-12-10 ENCOUNTER — Encounter: Payer: Self-pay | Admitting: Family Medicine

## 2021-12-10 VITALS — BP 132/82 | HR 63 | Temp 97.6°F | Resp 16 | Wt 144.0 lb

## 2021-12-10 DIAGNOSIS — R350 Frequency of micturition: Secondary | ICD-10-CM

## 2021-12-10 LAB — POCT URINALYSIS DIPSTICK
Bilirubin, UA: POSITIVE
Blood, UA: POSITIVE
Glucose, UA: NEGATIVE
Ketones, UA: NEGATIVE
Nitrite, UA: POSITIVE
Protein, UA: POSITIVE — AB
Urobilinogen, UA: 4 E.U./dL — AB
pH, UA: >=9 — AB (ref 5.0–8.0)

## 2021-12-10 MED ORDER — SULFAMETHOXAZOLE-TRIMETHOPRIM 800-160 MG PO TABS: 1.0000 | ORAL_TABLET | Freq: Two times a day (BID) | ORAL | 0 refills | Status: DC

## 2021-12-10 NOTE — Progress Notes (Signed)
° °  Subjective:    Patient ID: [name], female    DOB: October 19, 1923, 88 y.o.   MRN: [id number]  HPI UTI- sxs started on Thursday.  Had burning w/ urination, frequency, suprapubic pain.  Started OTC AZO.  Reports cloudy urine, no blood.  Denies vaginal sxs- no itching, discharge.  No back pain.  Some nausea.  No vomiting.   Review of Systems For ROS see HPI   This visit occurred during the SARS-CoV-2 public health emergency.  Safety protocols were in place, including screening questions prior to the visit, additional usage of staff PPE, and extensive cleaning of exam room while observing appropriate contact time as indicated for disinfecting solutions.      Objective:   Physical Exam Vitals reviewed.  Constitutional:      General: She is not in acute distress.    Appearance: Normal appearance. She is not ill-appearing.  HENT:     Head: Normocephalic and atraumatic.  Eyes:     Extraocular Movements: Extraocular movements intact.     Conjunctiva/sclera: Conjunctivae normal.     Pupils: Pupils are equal, round, and reactive to light.  Abdominal:     General: Abdomen is flat. There is no distension.     Palpations: Abdomen is soft.     Tenderness: There is abdominal tenderness (suprapubic TTP). There is no right CVA tenderness or left CVA tenderness.  Skin:    General: Skin is warm and dry.  Neurological:     General: No focal deficit present.     Mental Status: She is alert and oriented to person, place, and time.  Psychiatric:        Mood and Affect: Mood normal.        Behavior: Behavior normal.        Thought Content: Thought content normal.          Assessment & Plan:  Urinary frequency- pt's sxs are textbook for UTI and UA confirms infxn.  Will start Bactrim while awaiting culture results.  Pt expressed understanding and is in agreement w/ plan.

## 2021-12-10 NOTE — Patient Instructions (Signed)
Follow up as needed or as scheduled START the Bactrim twice daily- take w/ food Drink LOTS of fluids Call with any questions or concerns Hang in there!!!

## 2021-12-12 ENCOUNTER — Telehealth: Payer: Self-pay

## 2021-12-12 DIAGNOSIS — H34811 Central retinal vein occlusion, right eye, with macular edema: Secondary | ICD-10-CM | POA: Diagnosis not present

## 2021-12-12 NOTE — Telephone Encounter (Signed)
Spoke to patient and she states that the frequency is better but she still has some burning. She is on day 2 of the antibx. Would you like her to give another sample for a culture?

## 2021-12-13 ENCOUNTER — Other Ambulatory Visit: Payer: Medicare PPO

## 2021-12-13 ENCOUNTER — Other Ambulatory Visit: Payer: Self-pay

## 2021-12-13 ENCOUNTER — Telehealth: Payer: Self-pay

## 2021-12-13 ENCOUNTER — Ambulatory Visit: Payer: Medicare PPO

## 2021-12-13 DIAGNOSIS — R350 Frequency of micturition: Secondary | ICD-10-CM | POA: Diagnosis not present

## 2021-12-13 NOTE — Telephone Encounter (Signed)
Spoke to patient and she stated that she can have her aid bring a urine here so we can culture it. She also stated that some of her symptoms have come back and also having pain in her vaginal area and describes it as a pushing sensation.

## 2021-12-13 NOTE — Telephone Encounter (Signed)
If she is able to provide another sample, I think we should culture it.  If she can't get here (or get urine here) I understand

## 2021-12-13 NOTE — Telephone Encounter (Signed)
Patient is calling to speak with you.

## 2021-12-13 NOTE — Telephone Encounter (Signed)
Caller name:Charmaine Stylers   On DPR? :Yes  Call back number:[phone]  Provider they see: Birdie Riddle   Reason for call:Pt called and would not go into detail she wanted to talk to [facility] she would not tell me what it was regarding

## 2021-12-15 LAB — URINE CULTURE
MICRO NUMBER:: 12958110
Result:: NO GROWTH
SPECIMEN QUALITY:: ADEQUATE

## 2021-12-20 ENCOUNTER — Ambulatory Visit: Payer: Medicare PPO | Admitting: Family Medicine

## 2021-12-21 DIAGNOSIS — M25561 Pain in right knee: Secondary | ICD-10-CM | POA: Diagnosis not present

## 2021-12-24 DIAGNOSIS — M150 Primary generalized (osteo)arthritis: Secondary | ICD-10-CM | POA: Diagnosis not present

## 2021-12-24 DIAGNOSIS — S22000A Wedge compression fracture of unspecified thoracic vertebra, initial encounter for closed fracture: Secondary | ICD-10-CM | POA: Diagnosis not present

## 2021-12-24 DIAGNOSIS — G5601 Carpal tunnel syndrome, right upper limb: Secondary | ICD-10-CM | POA: Diagnosis not present

## 2021-12-24 DIAGNOSIS — Z7952 Long term (current) use of systemic steroids: Secondary | ICD-10-CM | POA: Diagnosis not present

## 2021-12-24 DIAGNOSIS — M353 Polymyalgia rheumatica: Secondary | ICD-10-CM | POA: Diagnosis not present

## 2021-12-24 DIAGNOSIS — M810 Age-related osteoporosis without current pathological fracture: Secondary | ICD-10-CM | POA: Diagnosis not present

## 2021-12-24 DIAGNOSIS — Z6823 Body mass index (BMI) 23.0-23.9, adult: Secondary | ICD-10-CM | POA: Diagnosis not present

## 2021-12-27 ENCOUNTER — Ambulatory Visit (INDEPENDENT_AMBULATORY_CARE_PROVIDER_SITE_OTHER): Payer: Medicare PPO

## 2021-12-27 ENCOUNTER — Other Ambulatory Visit: Payer: Self-pay | Admitting: Family Medicine

## 2021-12-27 VITALS — Wt 144.0 lb

## 2021-12-27 DIAGNOSIS — Z0000 Encounter for general adult medical examination without abnormal findings: Secondary | ICD-10-CM

## 2021-12-27 NOTE — Patient Instructions (Signed)
[name] , Thank you for taking time to come for your Medicare Wellness Visit. I appreciate your ongoing commitment to your health goals. Please review the following plan we discussed and let me know if I can assist you in the future.   Screening recommendations/referrals: Colonoscopy: Done 10/13/2013 -no repeat required Mammogram: Done 12/25/2017 - no repeat required Bone Density: Done 04/27/2014 - no repeat required Recommended yearly ophthalmology/optometry visit for glaucoma screening and checkup Recommended yearly dental visit for hygiene and checkup  Vaccinations: Influenza vaccine: Done 08/23/2021 - Repeat annually Pneumococcal vaccine: Done 2010 & 2015 Tdap vaccine: Done 04/02/2013 - Repeat in 10 years Shingles vaccine: Zostavax done 2014 - due for Shingrix   Covid-19: Done 12/18/2019, 02/04/2020, & 08/28/2020  Advanced directives: in chart  Conditions/risks identified: Aim for 30 minutes of exercise or brisk walking each day, drink 6-8 glasses of water and eat lots of fruits and vegetables.   Next appointment: Follow up in one year for your annual wellness visit    Preventive Care 65 Years and Older, Female Preventive care refers to lifestyle choices and visits with your health care provider that can promote health and wellness. What does preventive care include? A yearly physical exam. This is also called an annual well check. Dental exams once or twice a year. Routine eye exams. Ask your health care provider how often you should have your eyes checked. Personal lifestyle choices, including: Daily care of your teeth and gums. Regular physical activity. Eating a healthy diet. Avoiding tobacco and drug use. Limiting alcohol use. Practicing safe sex. Taking low-dose aspirin every day. Taking vitamin and mineral supplements as recommended by your health care provider. What happens during an annual well check? The services and screenings done by your health care provider  during your annual well check will depend on your age, overall health, lifestyle risk factors, and family history of disease. Counseling  Your health care provider may ask you questions about your: Alcohol use. Tobacco use. Drug use. Emotional well-being. Home and relationship well-being. Sexual activity. Eating habits. History of falls. Memory and ability to understand (cognition). Work and work Statistician. Reproductive health. Screening  You may have the following tests or measurements: Height, weight, and BMI. Blood pressure. Lipid and cholesterol levels. These may be checked every 5 years, or more frequently if you are over 67 years old. Skin check. Lung cancer screening. You may have this screening every year starting at age 41 if you have a 30-pack-year history of smoking and currently smoke or have quit within the past 15 years. Fecal occult blood test (FOBT) of the stool. You may have this test every year starting at age 85. Flexible sigmoidoscopy or colonoscopy. You may have a sigmoidoscopy every 5 years or a colonoscopy every 10 years starting at age 61. Hepatitis C blood test. Hepatitis B blood test. Sexually transmitted disease (STD) testing. Diabetes screening. This is done by checking your blood sugar (glucose) after you have not eaten for a while (fasting). You may have this done every 1-3 years. Bone density scan. This is done to screen for osteoporosis. You may have this done starting at age 32. Mammogram. This may be done every 1-2 years. Talk to your health care provider about how often you should have regular mammograms. Talk with your health care provider about your test results, treatment options, and if necessary, the need for more tests. Vaccines  Your health care provider may recommend certain vaccines, such as: Influenza vaccine. This is recommended every  year. Tetanus, diphtheria, and acellular pertussis (Tdap, Td) vaccine. You may need a Td booster every  10 years. Zoster vaccine. You may need this after age 75. Pneumococcal 13-valent conjugate (PCV13) vaccine. One dose is recommended after age 31. Pneumococcal polysaccharide (PPSV23) vaccine. One dose is recommended after age 3. Talk to your health care provider about which screenings and vaccines you need and how often you need them. This information is not intended to replace advice given to you by your health care provider. Make sure you discuss any questions you have with your health care provider. Document Released: 11/24/2015 Document Revised: 07/17/2016 Document Reviewed: 08/29/2015 Elsevier Interactive Patient Education  2017 [employer] Prevention in the Home Falls can cause injuries. They can happen to people of all ages. There are many things you can do to make your home safe and to help prevent falls. What can I do on the outside of my home? Regularly fix the edges of walkways and driveways and fix any cracks. Remove anything that might make you trip as you walk through a door, such as a raised step or threshold. Trim any bushes or trees on the path to your home. Use bright outdoor lighting. Clear any walking paths of anything that might make someone trip, such as rocks or tools. Regularly check to see if handrails are loose or broken. Make sure that both sides of any steps have handrails. Any raised decks and porches should have guardrails on the edges. Have any leaves, snow, or ice cleared regularly. Use sand or salt on walking paths during winter. Clean up any spills in your garage right away. This includes oil or grease spills. What can I do in the bathroom? Use night lights. Install grab bars by the toilet and in the tub and shower. Do not use towel bars as grab bars. Use non-skid mats or decals in the tub or shower. If you need to sit down in the shower, use a plastic, non-slip stool. Keep the floor dry. Clean up any water that spills on the floor as soon as it  happens. Remove soap buildup in the tub or shower regularly. Attach bath mats securely with double-sided non-slip rug tape. Do not have throw rugs and other things on the floor that can make you trip. What can I do in the bedroom? Use night lights. Make sure that you have a light by your bed that is easy to reach. Do not use any sheets or blankets that are too big for your bed. They should not hang down onto the floor. Have a firm chair that has side arms. You can use this for support while you get dressed. Do not have throw rugs and other things on the floor that can make you trip. What can I do in the kitchen? Clean up any spills right away. Avoid walking on wet floors. Keep items that you use a lot in easy-to-reach places. If you need to reach something above you, use a strong step stool that has a grab bar. Keep electrical cords out of the way. Do not use floor polish or wax that makes floors slippery. If you must use wax, use non-skid floor wax. Do not have throw rugs and other things on the floor that can make you trip. What can I do with my stairs? Do not leave any items on the stairs. Make sure that there are handrails on both sides of the stairs and use them. Fix handrails that are broken or  loose. Make sure that handrails are as long as the stairways. Check any carpeting to make sure that it is firmly attached to the stairs. Fix any carpet that is loose or worn. Avoid having throw rugs at the top or bottom of the stairs. If you do have throw rugs, attach them to the floor with carpet tape. Make sure that you have a light switch at the top of the stairs and the bottom of the stairs. If you do not have them, ask someone to add them for you. What else can I do to help prevent falls? Wear shoes that: Do not have high heels. Have rubber bottoms. Are comfortable and fit you well. Are closed at the toe. Do not wear sandals. If you use a stepladder: Make sure that it is fully opened.  Do not climb a closed stepladder. Make sure that both sides of the stepladder are locked into place. Ask someone to hold it for you, if possible. Clearly mark and make sure that you can see: Any grab bars or handrails. First and last steps. Where the edge of each step is. Use tools that help you move around (mobility aids) if they are needed. These include: Canes. Walkers. Scooters. Crutches. Turn on the lights when you go into a dark area. Replace any light bulbs as soon as they burn out. Set up your furniture so you have a clear path. Avoid moving your furniture around. If any of your floors are uneven, fix them. If there are any pets around you, be aware of where they are. Review your medicines with your doctor. Some medicines can make you feel dizzy. This can increase your chance of falling. Ask your doctor what other things that you can do to help prevent falls. This information is not intended to replace advice given to you by your health care provider. Make sure you discuss any questions you have with your health care provider. Document Released: 08/24/2009 Document Revised: 04/04/2016 Document Reviewed: 12/02/2014 Elsevier Interactive Patient Education  2017 [employer].

## 2021-12-27 NOTE — Progress Notes (Signed)
Subjective:   [name] is a 88 y.o. female who presents for Medicare Annual (Subsequent) preventive examination.  Virtual Visit via Telephone Note  I connected with  [name] on 12/27/21 at 12:45 PM EST by telephone and verified that I am speaking with the correct person using two identifiers.  Location: Patient: Home Provider: [facility] Persons participating in the virtual visit: patient/Nurse Health Advisor   I discussed the limitations, risks, security and privacy concerns of performing an evaluation and management service by telephone and the availability of in person appointments. The patient expressed understanding and agreed to proceed.  Interactive audio and video telecommunications were attempted between this nurse and patient, however failed, due to patient having technical difficulties OR patient did not have access to video capability.  We continued and completed visit with audio only.  Some vital signs may be absent or patient reported.   Manuela Halbur E Fredricka Kohrs, LPN   Review of Systems     Cardiac Risk Factors include: advanced age (>69men, >34 women);sedentary lifestyle;Other (see comment);hypertension, Risk factor comments: atherosclerosis, A.Fib     Objective:    Today's Vitals   12/27/21 1247  Weight: 144 lb (65.3 kg)   Body mass index is 23.24 kg/m.  Advanced Directives 12/27/2021 07/19/2021 07/17/2021 01/17/2021 01/09/2021 12/03/2020 12/02/2020  Does Patient Have a Medical Advance Directive? Yes Yes Yes Yes Yes Yes Yes  Type of Paramedic of [place];Living will [place];Living will [place];Living will [place];Living will [place];Living will [place];Living will [place];Living will  Does patient want to make changes to medical advance directive? - - No - Patient declined No - Patient declined - No - Patient declined -   Copy of [place] in Chart? Yes - validated most recent copy scanned in chart (See row information) - No - copy requested - - - -  Would patient like information on creating a medical advance directive? - - - - - - -  Pre-existing out of facility DNR order (yellow form or pink MOST form) - - - - - - -    Current Medications (verified) Outpatient Encounter Medications as of 12/27/2021  Medication Sig   acetaminophen (TYLENOL) 500 MG tablet Take 500 mg by mouth at bedtime.   apixaban (ELIQUIS) 5 MG TABS tablet Take 1 tablet (5 mg total) by mouth 2 (two) times daily.   diltiazem (CARDIZEM CD) 120 MG 24 hr capsule TAKE 1 CAPSULE BY MOUTH EVERY DAY   docusate sodium (COLACE) 100 MG capsule Take 1 capsule (100 mg total) by mouth 2 (two) times daily.   gabapentin (NEURONTIN) 300 MG capsule TAKE 1 CAPSULE BY MOUTH EVERYDAY AT BEDTIME   levothyroxine (SYNTHROID) 50 MCG tablet TAKE 1 TABLET BY MOUTH EVERY DAY   losartan (COZAAR) 50 MG tablet Take 1 tablet (50 mg total) by mouth daily.   nadolol (CORGARD) 80 MG tablet Take 1 tablet (80 mg total) by mouth daily.   pantoprazole (PROTONIX) 40 MG tablet TAKE 1 TABLET BY MOUTH EVERY DAY   predniSONE (DELTASONE) 1 MG tablet Take 4 tablets (4 mg total) by mouth daily with lunch.   Vitamin D, Ergocalciferol, (DRISDOL) 1.25 MG (50000 UNIT) CAPS capsule Take 1 capsule (50,000 Units total) by mouth every 7 (seven) days.   [DISCONTINUED] diltiazem (CARDIZEM SR) 60 MG 12 hr capsule TAKE 1 CAPSULE BY MOUTH 2 TIMES DAILY. (Patient not taking:  Reported on 12/27/2021)   [DISCONTINUED] sulfamethoxazole-trimethoprim (BACTRIM DS) 800-160 MG tablet Take 1 tablet by mouth 2 (two) times daily. (Patient not taking: Reported on 12/27/2021)   No facility-administered encounter medications on file as of 12/27/2021.    Allergies (verified) Keflex [cephalexin] and Codeine   History: Past Medical History:  Diagnosis Date   Closed fracture of left distal  radius 12/29/2019   DVT (deep venous thrombosis) ([place]) 09/2015   RLE   Dysrhythmia    Afib   GERD (gastroesophageal reflux disease)    Hip fracture ([place]) 12/02/2020   Hypertension    Hypothyroidism    Melanoma ([place]) 06/16/2017   Facial melanoma, removed by Dr. Harvel Quale   Peripheral vascular disease [facility])    peripheral neuropathy   Past Surgical History:  Procedure Laterality Date   ABDOMINAL HYSTERECTOMY     APPENDECTOMY     BACK SURGERY     BREAST SURGERY     left breast biopsy   CARDIOVERSION N/A 05/09/2020   Procedure: CARDIOVERSION;  Surgeon: Skeet Latch, MD;  Location: [facility] ENDOSCOPY;  Service: Cardiovascular;  Laterality: N/A;   COLOSTOMY N/A 01/06/2020   Procedure: Colostomy;  Surgeon: Greer Pickerel, MD;  Location: [place];  Service: General;  Laterality: N/A;   EYE SURGERY     cataract   HARDWARE REMOVAL Left 01/01/2020   Procedure: HARDWARE REMOVAL;  Surgeon: Iran Planas, MD;  Location: [place];  Service: Orthopedics;  Laterality: Left;   HIP ARTHROPLASTY Left 12/03/2020   Procedure: ARTHROPLASTY BIPOLAR HIP (HEMIARTHROPLASTY);  Surgeon: Renette Butters, MD;  Location: [place];  Service: Orthopedics;  Laterality: Left;   HIP ARTHROPLASTY Left    partial   IR KYPHO EA ADDL LEVEL THORACIC OR LUMBAR  06/27/2021   IR RADIOLOGIST EVAL & MGMT  07/13/2021   LAPAROTOMY N/A 01/06/2020   Procedure: Exploratory Laparotomy, Open Sigmoid colectomy;  Surgeon: Greer Pickerel, MD;  Location: [place];  Service: General;  Laterality: N/A;   LIPOMA EXCISION Left 04/25/2015   Procedure: EXCISION OF LIPOMA LEFT ARM;  Surgeon: Donnie Mesa, MD;  Location: [place];  Service: General;  Laterality: Left;   LUMBAR LAMINECTOMY/DECOMPRESSION MICRODISCECTOMY  11/20/2011   Procedure: LUMBAR LAMINECTOMY/DECOMPRESSION MICRODISCECTOMY;  Surgeon: Laurice Record [place];  Location: [facility];  Service: Orthopedics;  Laterality: N/A;  Decompressive Laminectomy L2 to the Sacrum (X-Ray)   LYSIS OF ADHESION N/A  01/17/2021   Procedure: RIGID PROCTOSCOPY;  Surgeon: Michael Boston, MD;  Location: [facility];  Service: General;  Laterality: N/A;   OPEN REDUCTION INTERNAL FIXATION (ORIF) DISTAL RADIAL FRACTURE Left 01/01/2020   Procedure: OPEN REDUCTION INTERNAL FIXATION (ORIF) DISTAL RADIAL FRACTURE;  Surgeon: Iran Planas, MD;  Location: [place];  Service: Orthopedics;  Laterality: Left;   OTHER SURGICAL HISTORY     left wrist surgery - has plate in left wrist    OTHER SURGICAL HISTORY     right knee surgery due to torn cartilage    TONSILLECTOMY     WRIST FRACTURE SURGERY Left    XI ROBOTIC ASSISTED COLOSTOMY TAKEDOWN N/A 01/17/2021   Procedure: XI ROBOTIC ASSISTED OSTOMY TAKEDOWN, LYSIS OF ADHESIONS, RECTOSIGMOID RESECTION, BILATERAL TAP BLOCK;  Surgeon: Michael Boston, MD;  Location: [facility];  Service: General;  Laterality: N/A;   Family History  Problem Relation Age of Onset   Cancer Mother        BREAST   COPD Father    Emphysema Father    Cancer Daughter  breast   Social History   Socioeconomic History   Marital status: Widowed    Spouse name: Not on file   Number of children: Not on file   Years of education: Not on file   Highest education level: Not on file  Occupational History   Occupation: retired  Tobacco Use   Smoking status: Never   Smokeless tobacco: Never  Vaping Use   Vaping Use: Never used  Substance and Sexual Activity   Alcohol use: No   Drug use: No   Sexual activity: Never  Other Topics Concern   Not on file  Social History Narrative   Lots of family nearby   She has an aide 4 days per week   Social Determinants of Health   Financial Resource Strain: Low Risk    Difficulty of Paying Living Expenses: Not hard at all  Food Insecurity: No Food Insecurity   Worried About Charity fundraiser in the Last Year: Never true   Arboriculturist in the Last Year: Never true  Transportation Needs: No Transportation Needs   Lack of Transportation (Medical): No   Lack of  Transportation (Non-Medical): No  Physical Activity: Insufficiently Active   Days of Exercise per Week: 7 days   Minutes of Exercise per Session: 20 min  Stress: No Stress Concern Present   Feeling of Stress : Not at all  Social Connections: Moderately Integrated   Frequency of Communication with Friends and Family: More than three times a week   Frequency of Social Gatherings with Friends and Family: Once a week   Attends Religious Services: 1 to 4 times per year   Active Member of [employer] or Organizations: Yes   Attends Archivist Meetings: 1 to 4 times per year   Marital Status: Widowed    Tobacco Counseling Counseling given: Not Answered   Clinical Intake:  Pre-visit preparation completed: Yes  Pain : No/denies pain     BMI - recorded: 23.24 Nutritional Status: BMI of 19-24  Normal Nutritional Risks: None Diabetes: No  How often do you need to have someone help you when you read instructions, pamphlets, or other written materials from your doctor or pharmacy?: 1 - Never  Diabetic? no  Interpreter Needed?: No  Information entered by :: Rohit Deloria, LPN   Activities of Daily Living In your present state of health, do you have any difficulty performing the following activities: 12/27/2021 04/19/2021  Hearing? Y N  Comment mild -  Vision? N N  Difficulty concentrating or making decisions? N N  Walking or climbing stairs? Y N  Dressing or bathing? N N  Doing errands, shopping? Y N  Preparing Food and eating ? Y -  Comment has an aide 4 days per week -  Using the Toilet? N -  In the past six months, have you accidently leaked urine? Y -  Do you have problems with loss of bowel control? N -  Managing your Medications? N -  Managing your Finances? N -  Housekeeping or managing your Housekeeping? Y -  Comment pays someone to clean once per week -  Some recent data might be hidden    Patient Care Team: [name], MD as PCP - General (Family  Medicine) Deboraha Sprang, MD as PCP - Cardiology (Cardiology) Susa Day, MD as Consulting Physician (Orthopedic Surgery) Druscilla Brownie, MD as Consulting Physician (Dermatology) Iran Planas, MD as Consulting Physician (Orthopedic Surgery) Michael Boston, MD as Consulting Physician (  Colon and Rectal Surgery) Gavin Pound, MD as Consulting Physician (Rheumatology) Deboraha Sprang, MD as Consulting Physician (Cardiology)  Indicate any recent Medical Services you may have received from other than [facility] providers in the past year (date may be approximate).     Assessment:   This is a routine wellness examination for [name].  Hearing/Vision screen Hearing Screening - Comments:: Mild hearing difficulties - declines hearing difficulties  Vision Screening - Comments:: Wears rx glasses - up to date with routine eye exams with [place] issues and exercise activities discussed: Current Exercise Habits: Home exercise routine, Type of exercise: walking;stretching, Time (Minutes): 10, Frequency (Times/Week): 7, Weekly Exercise (Minutes/Week): 70, Intensity: Mild, Exercise limited by: orthopedic condition(s);cardiac condition(s)   Goals Addressed             This Visit's Progress    Patient Stated   On track    Move from walker to cane       Depression Screen PHQ 2/9 Scores 12/27/2021 10/24/2021 04/19/2021 02/01/2021 01/10/2021 11/30/2020 10/27/2020  PHQ - 2 Score 2 2 0 0 0 0 0  PHQ- 9 Score 3 4 0 0 0 0 0    Fall Risk Fall Risk  12/27/2021 10/24/2021 04/19/2021 02/01/2021 01/10/2021  Falls in the past year? 0 0 0 0 1  Comment - - - - -  Number falls in past yr: 0 - 0 0 0  Injury with Fall? 0 - 0 0 1  Comment - - - - broke left hip  Risk Factor Category  - - - - -  Risk for fall due to : Impaired balance/gait;Orthopedic patient;Medication side effect No Fall Risks No Fall Risks No Fall Risks History of fall(s)  Risk for fall due to: Comment - - - - -  Follow up Education  provided;Falls prevention discussed Falls evaluation completed - - -    FALL RISK PREVENTION PERTAINING TO THE HOME:  Any stairs in or around the home? Yes  - but doesn't have to use them If so, are there any without handrails? No  Home free of loose throw rugs in walkways, pet beds, electrical cords, etc? Yes  Adequate lighting in your home to reduce risk of falls? Yes   ASSISTIVE DEVICES UTILIZED TO PREVENT FALLS:  Life alert? Yes  Use of a cane, walker or w/c? Yes  Grab bars in the bathroom? Yes  Shower chair or bench in shower? Yes  Elevated toilet seat or a handicapped toilet? Yes   TIMED UP AND GO:  Was the test performed? No . Telephonic visit  Cognitive Function: Normal cognitive status assessed by direct observation by this Nurse Health Advisor. No abnormalities found.   MMSE - Mini Mental State Exam 08/12/2018  Orientation to time 5  Orientation to Place 5  Registration 3  Attention/ Calculation 5  Recall 3  Language- name 2 objects 2  Language- repeat 1  Language- follow 3 step command 3  Language- read & follow direction 1  Write a sentence 1  Copy design 1  Total score 30        Immunizations Immunization History  Administered Date(s) Administered   Fluad Quad(high Dose 65+) 08/14/2019, 08/23/2021   Influenza, High Dose Seasonal PF 08/07/2017, 08/12/2018   Influenza,inj,Quad PF,6+ Mos 07/31/2016, 09/26/2020   Influenza-Unspecified 10/12/2015   PFIZER(Purple Top)SARS-COV-2 Vaccination 12/18/2019, 02/04/2020, 08/28/2020   Pneumococcal Conjugate-13 04/08/2014   Pneumococcal Polysaccharide-23 11/11/2008   Td 04/02/2013   Zoster Recombinat (Shingrix) 04/02/2013  Zoster, Live 04/02/2013    TDAP status: Up to date  Flu Vaccine status: Up to date  Pneumococcal vaccine status: Up to date  Covid-19 vaccine status: Completed vaccines  Qualifies for Shingles Vaccine? Yes   Zostavax completed Yes   Shingrix Completed?: No.    Education has been  provided regarding the importance of this vaccine. Patient has been advised to call insurance company to determine out of pocket expense if they have not yet received this vaccine. Advised may also receive vaccine at local pharmacy or Health Dept. Verbalized acceptance and understanding.  Screening Tests Health Maintenance  Topic Date Due   Zoster Vaccines- Shingrix (2 of 2) 05/28/2013   COVID-19 Vaccine (4 - Booster for Pfizer series) 10/23/2020   TETANUS/TDAP  04/03/2023   Pneumonia Vaccine 28+ Years old  Completed   INFLUENZA VACCINE  Completed   DEXA SCAN  Completed   HPV VACCINES  Aged Out    Health Maintenance  Health Maintenance Due  Topic Date Due   Zoster Vaccines- Shingrix (2 of 2) 05/28/2013   COVID-19 Vaccine (4 - Booster for Pfizer series) 10/23/2020    Colorectal cancer screening: No longer required.   Mammogram status: No longer required due to age.  Bone Density status: Completed 04/27/2014. Results reflect: Bone density results: OSTEOPOROSIS. Repeat every 0 years.  Lung Cancer Screening: (Low Dose CT Chest recommended if Age 33-80 years, 30 pack-year currently smoking OR have quit w/in 15years.) does not qualify.  Additional Screening:  Hepatitis C Screening: does not qualify  Vision Screening: Recommended annual ophthalmology exams for early detection of glaucoma and other disorders of the eye. Is the patient up to date with their annual eye exam?  Yes  Who is the provider or what is the name of the office in which the patient attends annual eye exams? [place] If pt is not established with a provider, would they like to be referred to a provider to establish care? No .   Dental Screening: Recommended annual dental exams for proper oral hygiene  Community Resource Referral / Chronic Care Management: CRR required this visit?  No   CCM required this visit?  No      Plan:     I have personally reviewed and noted the following in the patients  chart:   Medical and social history Use of alcohol, tobacco or illicit drugs  Current medications and supplements including opioid prescriptions.  Functional ability and status Nutritional status Physical activity Advanced directives List of other physicians Hospitalizations, surgeries, and ER visits in previous 12 months Vitals Screenings to include cognitive, depression, and falls Referrals and appointments  In addition, I have reviewed and discussed with patient certain preventive protocols, quality metrics, and best practice recommendations. A written personalized care plan for preventive services as well as general preventive health recommendations were provided to patient.     Sandrea Hammond, LPN   7/74/1287   Nurse Notes: None

## 2021-12-30 ENCOUNTER — Other Ambulatory Visit: Payer: Self-pay | Admitting: Family Medicine

## 2022-01-16 DIAGNOSIS — H34811 Central retinal vein occlusion, right eye, with macular edema: Secondary | ICD-10-CM | POA: Diagnosis not present

## 2022-01-28 ENCOUNTER — Other Ambulatory Visit: Payer: Self-pay | Admitting: Family Medicine

## 2022-02-01 DIAGNOSIS — M1711 Unilateral primary osteoarthritis, right knee: Secondary | ICD-10-CM | POA: Diagnosis not present

## 2022-02-05 DIAGNOSIS — L821 Other seborrheic keratosis: Secondary | ICD-10-CM | POA: Diagnosis not present

## 2022-02-05 DIAGNOSIS — R208 Other disturbances of skin sensation: Secondary | ICD-10-CM | POA: Diagnosis not present

## 2022-02-05 DIAGNOSIS — D485 Neoplasm of uncertain behavior of skin: Secondary | ICD-10-CM | POA: Diagnosis not present

## 2022-02-05 DIAGNOSIS — L298 Other pruritus: Secondary | ICD-10-CM | POA: Diagnosis not present

## 2022-02-08 DIAGNOSIS — M1711 Unilateral primary osteoarthritis, right knee: Secondary | ICD-10-CM | POA: Diagnosis not present

## 2022-02-13 DIAGNOSIS — M1711 Unilateral primary osteoarthritis, right knee: Secondary | ICD-10-CM | POA: Diagnosis not present

## 2022-02-22 ENCOUNTER — Other Ambulatory Visit: Payer: Self-pay | Admitting: Family Medicine

## 2022-02-22 DIAGNOSIS — H34811 Central retinal vein occlusion, right eye, with macular edema: Secondary | ICD-10-CM | POA: Diagnosis not present

## 2022-03-11 ENCOUNTER — Telehealth: Payer: Self-pay | Admitting: Internal Medicine

## 2022-03-11 NOTE — Telephone Encounter (Signed)
Pt needs appt for BP management ?

## 2022-03-11 NOTE — Telephone Encounter (Signed)
Spoke with pt re: BP readings.  Pt denies current CP, SOB, dizziness, HA. Advised her to contact her PCP re: elevated BP readings as her PCP has been the provider managing her hypertension and Dr Caryl Comes manages her heart rhythm.  Provided education on importance of a low sodium diet and measuring BP 2 hours after taking morning medications.  Pt takes daily Prednisone.  She reports she is taking medications as prescribed.  She denies being under any new or increased stress.  Continue to monitor BP and record.  Reviewed ED precautions.  Pt verbalizes understanding and agrees with current plan.   ?

## 2022-03-11 NOTE — Telephone Encounter (Signed)
Pt c/o BP issue: STAT if pt c/o blurred vision, one-sided weakness or slurred speech ? ?1. What are your last 5 BP readings?  ?185/97 - 4/27 ?181/98 ?186/92 ?202/101 ?177/90 ?186/94 - yesterday - 4/30 ? ?2. Are you having any other symptoms (ex. Dizziness, headache, blurred vision, passed out)? No- Pressure in head ? ?3. What is your BP issue? Pt states that BP has been running high.  ? ? ?

## 2022-03-12 ENCOUNTER — Encounter: Payer: Self-pay | Admitting: Registered Nurse

## 2022-03-12 ENCOUNTER — Telehealth: Payer: Self-pay

## 2022-03-12 ENCOUNTER — Ambulatory Visit: Payer: Medicare PPO | Admitting: Registered Nurse

## 2022-03-12 VITALS — BP 170/88 | HR 64 | Temp 97.6°F | Resp 18 | Ht 66.0 in

## 2022-03-12 DIAGNOSIS — I10 Essential (primary) hypertension: Secondary | ICD-10-CM | POA: Diagnosis not present

## 2022-03-12 DIAGNOSIS — R42 Dizziness and giddiness: Secondary | ICD-10-CM

## 2022-03-12 MED ORDER — LOSARTAN POTASSIUM 50 MG PO TABS: 100.0000 mg | ORAL_TABLET | Freq: Every day | ORAL | 1 refills | Status: DC

## 2022-03-12 NOTE — Telephone Encounter (Signed)
Attempted call to schedule but pt phone line gave busy signal will try again later  ?

## 2022-03-12 NOTE — Patient Instructions (Signed)
Ms. Tabbert -  ? ?Great to meet you ? ?No concerns on exam or EKG ? ?Increase losartan to '100mg'$  daily.  ? ?Follow up in 2 weeks with myself or Dr. Birdie Riddle. ? ?Call sooner if BP doesn't come down a bit or if new symptoms arise. ? ?Thank you, ? ?Rich  ?

## 2022-03-12 NOTE — Telephone Encounter (Signed)
Patient was triaged for high blood pressure, readings were 200/90 with pressure like headaches along with SOB. Triage called and wanted patient to be seen in the ED within the hour, [name] declined asking for an appointment here in the office. Please advise.  ?

## 2022-03-12 NOTE — Telephone Encounter (Signed)
Dr Birdie Riddle has seen message and is aware, sending to you both for FYI. Patient is seeing Richard this afternoon.  ?

## 2022-03-12 NOTE — Progress Notes (Signed)
? ?Established Patient Office Visit ? ?Subjective:  ?Patient ID: [name], female    DOB: October 26, 1923  Age: 88 y.o. MRN: [id number] ? ?CC:  ?Chief Complaint  ?Patient presents with  ? Hypertension  ?  Patient states she has been having elevated BP since last week. Patient states she has been light headed , fatigue , SoB.  ? ? ?HPI ?[name] presents for htn ? ?Hypertension: ?Patient Currently taking: losartan '50mg'$  po qd, diltiazem '120mg'$  ER po qd, nadalol '80mg'$  po qd.  ?subtherapeutic effect. No AEs. ?Denies CV symptoms including: chest pain, shob, doe, visual changes, claudication, and dependent edema.  ? ?She does note some positional dizziness and pressure in her head, but denies true headache. ?Does note some exertional shortness of breath. ?She does say some pressure in the chest, but denies pain. Does not think this is cardiac in etiology.  ? ?Previous readings and labs: ?BP Readings from Last 3 Encounters:  ?03/12/22 (!) 170/88  ?12/10/21 132/82  ?10/24/21 126/84  ? ?Lab Results  ?Component Value Date  ? CREATININE 0.96 10/24/2021  ? ? ? ? ?Outpatient Medications Prior to Visit  ?Medication Sig Dispense Refill  ? acetaminophen (TYLENOL) 500 MG tablet Take 500 mg by mouth at bedtime.    ? apixaban (ELIQUIS) 5 MG TABS tablet Take 1 tablet (5 mg total) by mouth 2 (two) times daily. 180 tablet 3  ? diltiazem (CARDIZEM CD) 120 MG 24 hr capsule TAKE 1 CAPSULE BY MOUTH EVERY DAY 30 capsule 0  ? docusate sodium (COLACE) 100 MG capsule Take 1 capsule (100 mg total) by mouth 2 (two) times daily. 60 capsule 0  ? gabapentin (NEURONTIN) 300 MG capsule TAKE 1 CAPSULE BY MOUTH EVERYDAY AT BEDTIME 90 capsule 1  ? levothyroxine (SYNTHROID) 50 MCG tablet TAKE 1 TABLET BY MOUTH EVERY DAY 90 tablet 1  ? nadolol (CORGARD) 80 MG tablet Take 1 tablet (80 mg total) by mouth daily. 90 tablet 1  ? pantoprazole (PROTONIX) 40 MG tablet TAKE 1 TABLET BY MOUTH EVERY DAY 90 tablet 1  ? predniSONE (DELTASONE) 1 MG tablet Take 4  tablets (4 mg total) by mouth daily with lunch.    ? Vitamin D, Ergocalciferol, (DRISDOL) 1.25 MG (50000 UNIT) CAPS capsule Take 1 capsule (50,000 Units total) by mouth every 7 (seven) days. 12 capsule 0  ? losartan (COZAAR) 50 MG tablet TAKE 1 TABLET BY MOUTH EVERY DAY 90 tablet 1  ? ?No facility-administered medications prior to visit.  ? ? ?Review of Systems  ?Constitutional:  Positive for fatigue.  ?HENT: Negative.    ?Eyes: Negative.   ?Respiratory: Negative.    ?Cardiovascular: Negative.   ?Gastrointestinal: Negative.   ?Genitourinary: Negative.   ?Musculoskeletal: Negative.   ?Skin: Negative.   ?Neurological:  Positive for dizziness and headaches. Negative for tremors, seizures, syncope, facial asymmetry, speech difficulty, weakness, light-headedness and numbness.  ?Psychiatric/Behavioral: Negative.    ?All other systems reviewed and are negative. ? ?  ?Objective:  ?  ? ?BP (!) 170/88   Pulse 64   Temp 97.6 ?F (36.4 ?C) (Temporal)   Resp 18   Ht '5\' 6"'$  (1.676 m)   SpO2 98%   BMI 23.24 kg/m?  ? ?Wt Readings from Last 3 Encounters:  ?12/27/21 144 lb (65.3 kg)  ?12/10/21 144 lb (65.3 kg)  ?10/24/21 143 lb 9.6 oz (65.1 kg)  ? ?Physical Exam ?Vitals and nursing note reviewed.  ?Constitutional:   ?   General: She is not in  acute distress. ?   Appearance: Normal appearance. She is normal weight. She is not ill-appearing, toxic-appearing or diaphoretic.  ?Cardiovascular:  ?   Rate and Rhythm: Normal rate and regular rhythm.  ?   Heart sounds: Normal heart sounds. No murmur heard. ?  No friction rub. No gallop.  ?Pulmonary:  ?   Effort: Pulmonary effort is normal. No respiratory distress.  ?   Breath sounds: Normal breath sounds. No stridor. No wheezing, rhonchi or rales.  ?Chest:  ?   Chest wall: No tenderness.  ?Musculoskeletal:     ?   General: Normal range of motion.  ?   Comments: Exam findings at pt baseline  ?Skin: ?   General: Skin is warm and dry.  ?   Capillary Refill: Capillary refill takes less than 2  seconds.  ?Neurological:  ?   General: No focal deficit present.  ?   Mental Status: She is alert and oriented to person, place, and time. Mental status is at baseline.  ?   Cranial Nerves: No cranial nerve deficit.  ?   Sensory: No sensory deficit.  ?   Motor: No weakness.  ?   Coordination: Coordination normal.  ?   Gait: Gait normal.  ?Psychiatric:     ?   Mood and Affect: Mood normal.     ?   Behavior: Behavior normal.     ?   Thought Content: Thought content normal.     ?   Judgment: Judgment normal.  ? ? ?No results found for any visits on 03/12/22. ? ? ? ?The ASCVD Risk score (Arnett DK, et al., 2019) failed to calculate for the following reasons: ?  The 2019 ASCVD risk score is only valid for ages 66 to 22 ? ?  ?Assessment & Plan:  ? ?Problem List Items Addressed This Visit   ? ?  ? Cardiovascular and Mediastinum  ? HTN (hypertension) (Chronic)  ? Relevant Medications  ? losartan (COZAAR) 50 MG tablet  ? ?Other Visit Diagnoses   ? ? Light headed    -  Primary  ? Relevant Medications  ? losartan (COZAAR) 50 MG tablet  ? Other Relevant Orders  ? EKG 12-Lead (Completed)  ? ?  ? ? ?Meds ordered this encounter  ?Medications  ? losartan (COZAAR) 50 MG tablet  ?  Sig: Take 2 tablets (100 mg total) by mouth daily.  ?  Dispense:  180 tablet  ?  Refill:  1  ?  Order Specific Question:   Supervising Provider  ?  Answer:   Carlota Raspberry, JEFFREY R [[id number]]  ? ? ?Return in about 2 weeks (around 03/26/2022) for BP follow up - w PCP if available.  ? ?PLAN ?EKG obtained, compared to 05/01/21. Sinus brady, otherwise no acute changes. RRR. ?Neuro exam unremarkable in office today. ?Will increase losartan to '100mg'$  po qd and have her closely monitor BP with checks 2-3 times daily. ?Encouraged her with any headache, chest pain, visual changes, LOC, dizziness, facial drooping, slurred speech, or any other acute changes, that she should present to ED. ?She will return in around 2 weeks for recheck with myself or PCP ?Patient encouraged to  call clinic with any questions, comments, or concerns. ? ? ?Maximiano Coss, NP ?

## 2022-03-12 NOTE — Telephone Encounter (Signed)
Noted. Agree with ED recommendation given symptomatic hypertension. If she calls back with any worsening symptoms we should reiterate this recommendation. I will see her this afternoon. ? ?Thanks, ? ?Rich

## 2022-03-13 NOTE — Telephone Encounter (Signed)
Team health outcome : ? ? ?Chief Complaint BREATHING - shortness of breath or sounds ?breathless ?Reason for Call Symptomatic / Request for Health Information ?Initial Comment Caller states she is has high blood pressure, ?shortness of breath ?Translation No ?Nurse Assessment ?Nurse: Kirk Ruths, RN, [facility] Date/Time Eilene Ghazi Time): 03/12/2022 8:10:41 AM ?Confirm and document reason for call. If ?symptomatic, describe symptoms. ?---Caller states she is having shortness of breath and ?high blood pressure 201/104 has had her b/p meds ?exertion causes shortness of breath and makes her feel ?very tired denies chest pain 168/85 post medication ?slight cough allergy related no fever ?Does the patient have any new or worsening ?symptoms? ---Yes ?Will a triage be completed? ---Yes ?Related visit to physician within the last 2 weeks? ---No ?Does the PT have any chronic conditions? (i.e. ?diabetes, asthma, this includes High risk factors for ?pregnancy, etc.) ?---Yes ?List chronic conditions. ---htn, reflux, afib, ?Is this a behavioral health or substance abuse call? ---No ?Guidelines ?Guideline Title Affirmed Question Affirmed Notes Nurse Date/Time (Eastern ?Time) ?Chest Pain Taking a deep breath ?makes pain worse ?Kirk Ruths, RN, [place] 03/12/2022 8:15:07 AM ?PLEASE NOTE: All timestamps contained within this report are represented as [place] Standard Time. ?CONFIDENTIALTY NOTICE: This fax transmission is intended only for the addressee. It contains information that is legally privileged, confidential or ?otherwise protected from use or disclosure. If you are not the intended recipient, you are strictly prohibited from reviewing, disclosing, copying using ?or disseminating any of this information or taking any action in reliance on or regarding this information. If you have received this fax in error, please ?notify us immediately by telephone so that we can arrange for its return to [place]. Phone: [phone], Toll-Free: [phone], Fax:  [phone] ?Page: 2 of 2 ?Call Id: [id number] ?Disp. Time (Eastern ?Time) Disposition Final User ?03/12/2022 8:08:54 AM Send to Urgent Queue Idolina Primer ?03/12/2022 8:20:49 AM Go to ED Now (or PCP triage) Yes Kirk Ruths, RN, [facility] ?Caller Disagree/Comply Disagree ?Caller Understands Yes ?PreDisposition Call Doctor ?Care Advice Given Per Guideline ?GO TO ED NOW (OR PCP TRIAGE): * IF NO PCP (PRIMARY CARE PROVIDER) SECOND-LEVEL TRIAGE: You need to ?be seen within the next hour. Go to the [facility] at _____________ [place] as soon as you can. CARE ADVICE given per ?Chest Pain (Adult) guideline. BRING MEDICINES: * Please bring a list of your current medicines when you go to see the doctor. * ?It is also a good idea to bring the pill bottles too. This will help the doctor to make certain you are taking the right medicines and the ?right dose. CALL EMS IF: * Severe difficulty breathing occurs * Passes out or becomes too weak to stand * You become worse ?Comments ?User: Tawni Levy, RN Date/Time Eilene Ghazi Time): 03/12/2022 8:23:10 AM ?Backline contacted per patient decline of ED visit. Office is to call patient back. Patient is aware ?Referrals ?GO TO FACILITY REFUSED ?

## 2022-03-20 ENCOUNTER — Other Ambulatory Visit: Payer: Self-pay | Admitting: Family Medicine

## 2022-03-25 ENCOUNTER — Inpatient Hospital Stay (HOSPITAL_COMMUNITY)
Admission: EM | Admit: 2022-03-25 | Discharge: 2022-04-02 | DRG: 065 | Disposition: A | Discharge status: Rehabilitation Facility | Payer: Medicare PPO | Attending: Internal Medicine | Admitting: Internal Medicine

## 2022-03-25 ENCOUNTER — Inpatient Hospital Stay (HOSPITAL_COMMUNITY): Payer: Medicare PPO

## 2022-03-25 ENCOUNTER — Emergency Department (HOSPITAL_COMMUNITY): Payer: Medicare PPO

## 2022-03-25 ENCOUNTER — Telehealth: Payer: Self-pay | Admitting: *Deleted

## 2022-03-25 DIAGNOSIS — N390 Urinary tract infection, site not specified: Secondary | ICD-10-CM | POA: Diagnosis present

## 2022-03-25 DIAGNOSIS — Z8673 Personal history of transient ischemic attack (TIA), and cerebral infarction without residual deficits: Secondary | ICD-10-CM | POA: Diagnosis not present

## 2022-03-25 DIAGNOSIS — Z888 Allergy status to other drugs, medicaments and biological substances status: Secondary | ICD-10-CM | POA: Diagnosis not present

## 2022-03-25 DIAGNOSIS — I610 Nontraumatic intracerebral hemorrhage in hemisphere, subcortical: Secondary | ICD-10-CM

## 2022-03-25 DIAGNOSIS — I358 Other nonrheumatic aortic valve disorders: Secondary | ICD-10-CM | POA: Diagnosis not present

## 2022-03-25 DIAGNOSIS — I618 Other nontraumatic intracerebral hemorrhage: Secondary | ICD-10-CM | POA: Diagnosis present

## 2022-03-25 DIAGNOSIS — M353 Polymyalgia rheumatica: Secondary | ICD-10-CM | POA: Diagnosis present

## 2022-03-25 DIAGNOSIS — I5033 Acute on chronic diastolic (congestive) heart failure: Secondary | ICD-10-CM | POA: Diagnosis not present

## 2022-03-25 DIAGNOSIS — D72829 Elevated white blood cell count, unspecified: Secondary | ICD-10-CM | POA: Diagnosis not present

## 2022-03-25 DIAGNOSIS — I959 Hypotension, unspecified: Secondary | ICD-10-CM | POA: Diagnosis not present

## 2022-03-25 DIAGNOSIS — D638 Anemia in other chronic diseases classified elsewhere: Secondary | ICD-10-CM | POA: Diagnosis not present

## 2022-03-25 DIAGNOSIS — I4819 Other persistent atrial fibrillation: Secondary | ICD-10-CM | POA: Diagnosis not present

## 2022-03-25 DIAGNOSIS — I619 Nontraumatic intracerebral hemorrhage, unspecified: Secondary | ICD-10-CM | POA: Diagnosis not present

## 2022-03-25 DIAGNOSIS — R29810 Facial weakness: Secondary | ICD-10-CM | POA: Diagnosis not present

## 2022-03-25 DIAGNOSIS — I6389 Other cerebral infarction: Secondary | ICD-10-CM | POA: Diagnosis not present

## 2022-03-25 DIAGNOSIS — G8191 Hemiplegia, unspecified affecting right dominant side: Secondary | ICD-10-CM | POA: Diagnosis not present

## 2022-03-25 DIAGNOSIS — Z7901 Long term (current) use of anticoagulants: Secondary | ICD-10-CM

## 2022-03-25 DIAGNOSIS — I615 Nontraumatic intracerebral hemorrhage, intraventricular: Principal | ICD-10-CM | POA: Diagnosis present

## 2022-03-25 DIAGNOSIS — R0603 Acute respiratory distress: Secondary | ICD-10-CM | POA: Diagnosis not present

## 2022-03-25 DIAGNOSIS — R471 Dysarthria and anarthria: Secondary | ICD-10-CM | POA: Diagnosis present

## 2022-03-25 DIAGNOSIS — I161 Hypertensive emergency: Secondary | ICD-10-CM | POA: Diagnosis present

## 2022-03-25 DIAGNOSIS — R4701 Aphasia: Secondary | ICD-10-CM | POA: Diagnosis not present

## 2022-03-25 DIAGNOSIS — N189 Chronic kidney disease, unspecified: Secondary | ICD-10-CM | POA: Diagnosis not present

## 2022-03-25 DIAGNOSIS — G936 Cerebral edema: Secondary | ICD-10-CM | POA: Diagnosis not present

## 2022-03-25 DIAGNOSIS — A499 Bacterial infection, unspecified: Secondary | ICD-10-CM | POA: Diagnosis not present

## 2022-03-25 DIAGNOSIS — Z885 Allergy status to narcotic agent status: Secondary | ICD-10-CM | POA: Diagnosis not present

## 2022-03-25 DIAGNOSIS — Z825 Family history of asthma and other chronic lower respiratory diseases: Secondary | ICD-10-CM

## 2022-03-25 DIAGNOSIS — B9689 Other specified bacterial agents as the cause of diseases classified elsewhere: Secondary | ICD-10-CM | POA: Diagnosis not present

## 2022-03-25 DIAGNOSIS — I739 Peripheral vascular disease, unspecified: Secondary | ICD-10-CM | POA: Diagnosis present

## 2022-03-25 DIAGNOSIS — Z6823 Body mass index (BMI) 23.0-23.9, adult: Secondary | ICD-10-CM

## 2022-03-25 DIAGNOSIS — E785 Hyperlipidemia, unspecified: Secondary | ICD-10-CM | POA: Diagnosis present

## 2022-03-25 DIAGNOSIS — G319 Degenerative disease of nervous system, unspecified: Secondary | ICD-10-CM | POA: Diagnosis not present

## 2022-03-25 DIAGNOSIS — R0902 Hypoxemia: Secondary | ICD-10-CM | POA: Diagnosis not present

## 2022-03-25 DIAGNOSIS — B964 Proteus (mirabilis) (morganii) as the cause of diseases classified elsewhere: Secondary | ICD-10-CM | POA: Diagnosis present

## 2022-03-25 DIAGNOSIS — N179 Acute kidney failure, unspecified: Secondary | ICD-10-CM | POA: Diagnosis not present

## 2022-03-25 DIAGNOSIS — K219 Gastro-esophageal reflux disease without esophagitis: Secondary | ICD-10-CM | POA: Diagnosis not present

## 2022-03-25 DIAGNOSIS — I4891 Unspecified atrial fibrillation: Secondary | ICD-10-CM | POA: Diagnosis not present

## 2022-03-25 DIAGNOSIS — H548 Legal blindness, as defined in USA: Secondary | ICD-10-CM | POA: Diagnosis present

## 2022-03-25 DIAGNOSIS — I69251 Hemiplegia and hemiparesis following other nontraumatic intracranial hemorrhage affecting right dominant side: Secondary | ICD-10-CM | POA: Diagnosis not present

## 2022-03-25 DIAGNOSIS — Z85820 Personal history of malignant melanoma of skin: Secondary | ICD-10-CM

## 2022-03-25 DIAGNOSIS — R410 Disorientation, unspecified: Secondary | ICD-10-CM | POA: Diagnosis not present

## 2022-03-25 DIAGNOSIS — R630 Anorexia: Secondary | ICD-10-CM | POA: Diagnosis present

## 2022-03-25 DIAGNOSIS — I5030 Unspecified diastolic (congestive) heart failure: Secondary | ICD-10-CM | POA: Diagnosis not present

## 2022-03-25 DIAGNOSIS — I10 Essential (primary) hypertension: Secondary | ICD-10-CM | POA: Diagnosis not present

## 2022-03-25 DIAGNOSIS — F515 Nightmare disorder: Secondary | ICD-10-CM | POA: Diagnosis not present

## 2022-03-25 DIAGNOSIS — Z86718 Personal history of other venous thrombosis and embolism: Secondary | ICD-10-CM

## 2022-03-25 DIAGNOSIS — G629 Polyneuropathy, unspecified: Secondary | ICD-10-CM | POA: Diagnosis present

## 2022-03-25 DIAGNOSIS — I480 Paroxysmal atrial fibrillation: Secondary | ICD-10-CM | POA: Diagnosis present

## 2022-03-25 DIAGNOSIS — R4781 Slurred speech: Secondary | ICD-10-CM | POA: Diagnosis not present

## 2022-03-25 DIAGNOSIS — Z809 Family history of malignant neoplasm, unspecified: Secondary | ICD-10-CM

## 2022-03-25 DIAGNOSIS — I69391 Dysphagia following cerebral infarction: Secondary | ICD-10-CM | POA: Diagnosis not present

## 2022-03-25 DIAGNOSIS — R1310 Dysphagia, unspecified: Secondary | ICD-10-CM | POA: Diagnosis present

## 2022-03-25 DIAGNOSIS — Z96642 Presence of left artificial hip joint: Secondary | ICD-10-CM | POA: Diagnosis present

## 2022-03-25 DIAGNOSIS — Z79899 Other long term (current) drug therapy: Secondary | ICD-10-CM

## 2022-03-25 DIAGNOSIS — Z79890 Hormone replacement therapy (postmenopausal): Secondary | ICD-10-CM

## 2022-03-25 DIAGNOSIS — I69320 Aphasia following cerebral infarction: Secondary | ICD-10-CM | POA: Diagnosis not present

## 2022-03-25 DIAGNOSIS — M25512 Pain in left shoulder: Secondary | ICD-10-CM | POA: Diagnosis not present

## 2022-03-25 DIAGNOSIS — M21371 Foot drop, right foot: Secondary | ICD-10-CM | POA: Diagnosis present

## 2022-03-25 DIAGNOSIS — I482 Chronic atrial fibrillation, unspecified: Secondary | ICD-10-CM

## 2022-03-25 DIAGNOSIS — I639 Cerebral infarction, unspecified: Secondary | ICD-10-CM | POA: Diagnosis not present

## 2022-03-25 DIAGNOSIS — G9341 Metabolic encephalopathy: Secondary | ICD-10-CM | POA: Diagnosis not present

## 2022-03-25 DIAGNOSIS — R339 Retention of urine, unspecified: Secondary | ICD-10-CM | POA: Diagnosis not present

## 2022-03-25 DIAGNOSIS — E1122 Type 2 diabetes mellitus with diabetic chronic kidney disease: Secondary | ICD-10-CM | POA: Diagnosis not present

## 2022-03-25 DIAGNOSIS — D649 Anemia, unspecified: Secondary | ICD-10-CM | POA: Diagnosis not present

## 2022-03-25 DIAGNOSIS — R404 Transient alteration of awareness: Secondary | ICD-10-CM | POA: Diagnosis not present

## 2022-03-25 DIAGNOSIS — E039 Hypothyroidism, unspecified: Secondary | ICD-10-CM | POA: Diagnosis present

## 2022-03-25 DIAGNOSIS — I130 Hypertensive heart and chronic kidney disease with heart failure and stage 1 through stage 4 chronic kidney disease, or unspecified chronic kidney disease: Secondary | ICD-10-CM | POA: Diagnosis not present

## 2022-03-25 DIAGNOSIS — Z7952 Long term (current) use of systemic steroids: Secondary | ICD-10-CM

## 2022-03-25 LAB — COMPREHENSIVE METABOLIC PANEL
ALT: 11 U/L (ref 0–44)
AST: 16 U/L (ref 15–41)
Albumin: 3.4 g/dL — ABNORMAL LOW (ref 3.5–5.0)
Alkaline Phosphatase: 90 U/L (ref 38–126)
Anion gap: 8 (ref 5–15)
BUN: 19 mg/dL (ref 8–23)
CO2: 25 mmol/L (ref 22–32)
Calcium: 9.2 mg/dL (ref 8.9–10.3)
Chloride: 110 mmol/L (ref 98–111)
Creatinine, Ser: 0.87 mg/dL (ref 0.44–1.00)
GFR, Estimated: >60 mL/min (ref >60)
Glucose, Bld: 116 mg/dL — ABNORMAL HIGH (ref 70–99)
Potassium: 3.8 mmol/L (ref 3.5–5.1)
Sodium: 143 mmol/L (ref 135–145)
Total Bilirubin: 0.5 mg/dL (ref 0.3–1.2)
Total Protein: 5.9 g/dL — ABNORMAL LOW (ref 6.5–8.1)

## 2022-03-25 LAB — ECHOCARDIOGRAM COMPLETE
AR max vel: 2.85 cm2
AV Area VTI: 3.05 cm2
AV Area mean vel: 2.69 cm2
AV Mean grad: 8 mmHg
AV Peak grad: 14 mmHg
Ao pk vel: 1.87 m/s
Area-P 1/2: 3.65 cm2
Height: 66 in
P 1/2 time: 519 msec
S' Lateral: 3.4 cm
Weight: 2328.06 oz

## 2022-03-25 LAB — CBC
HCT: 28.7 % — ABNORMAL LOW (ref 36.0–46.0)
Hemoglobin: 9.4 g/dL — ABNORMAL LOW (ref 12.0–15.0)
MCH: 29.8 pg (ref 26.0–34.0)
MCHC: 32.8 g/dL (ref 30.0–36.0)
MCV: 91.1 fL (ref 80.0–100.0)
Platelets: 208 10*3/uL (ref 150–400)
RBC: 3.15 MIL/uL — ABNORMAL LOW (ref 3.87–5.11)
RDW: 14.6 % (ref 11.5–15.5)
WBC: 5.3 10*3/uL (ref 4.0–10.5)
nRBC: 0 % (ref 0.0–0.2)

## 2022-03-25 LAB — I-STAT CHEM 8, ED
BUN: 20 mg/dL (ref 8–23)
Calcium, Ion: 1.14 mmol/L — ABNORMAL LOW (ref 1.15–1.40)
Chloride: 108 mmol/L (ref 98–111)
Creatinine, Ser: 0.9 mg/dL (ref 0.44–1.00)
Glucose, Bld: 117 mg/dL — ABNORMAL HIGH (ref 70–99)
HCT: 35 % — ABNORMAL LOW (ref 36.0–46.0)
Hemoglobin: 11.9 g/dL — ABNORMAL LOW (ref 12.0–15.0)
Potassium: 3.8 mmol/L (ref 3.5–5.1)
Sodium: 144 mmol/L (ref 135–145)
TCO2: 23 mmol/L (ref 22–32)

## 2022-03-25 LAB — PROTIME-INR
INR: 1.5 — ABNORMAL HIGH (ref 0.8–1.2)
Prothrombin Time: 17.6 seconds — ABNORMAL HIGH (ref 11.4–15.2)

## 2022-03-25 LAB — CBG MONITORING, ED: Glucose-Capillary: 116 mg/dL — ABNORMAL HIGH (ref 70–99)

## 2022-03-25 LAB — DIFFERENTIAL
Abs Immature Granulocytes: 0.02 10*3/uL (ref 0.00–0.07)
Basophils Absolute: 0 10*3/uL (ref 0.0–0.1)
Basophils Relative: 0 %
Eosinophils Absolute: 0.3 10*3/uL (ref 0.0–0.5)
Eosinophils Relative: 5 %
Immature Granulocytes: 0 %
Lymphocytes Relative: 34 %
Lymphs Abs: 1.8 10*3/uL (ref 0.7–4.0)
Monocytes Absolute: 0.5 10*3/uL (ref 0.1–1.0)
Monocytes Relative: 9 %
Neutro Abs: 2.8 10*3/uL (ref 1.7–7.7)
Neutrophils Relative %: 52 %

## 2022-03-25 LAB — MRSA NEXT GEN BY PCR, NASAL: MRSA by PCR Next Gen: NOT DETECTED

## 2022-03-25 LAB — APTT: aPTT: 35 seconds (ref 24–36)

## 2022-03-25 MED ORDER — SODIUM CHLORIDE 0.9 % IV SOLN
INTRAVENOUS | Status: DC
Start: 2022-03-25 — End: 2022-03-26

## 2022-03-25 MED ORDER — CLEVIDIPINE BUTYRATE 0.5 MG/ML IV EMUL: 0.0000 mg/h | INTRAVENOUS | Status: DC

## 2022-03-25 MED ORDER — ACETAMINOPHEN 650 MG RE SUPP: 650.0000 mg | RECTAL | Status: DC | PRN

## 2022-03-25 MED ORDER — DOCUSATE SODIUM 100 MG PO CAPS: 100.0000 mg | ORAL_CAPSULE | Freq: Two times a day (BID) | ORAL | Status: DC

## 2022-03-25 MED ORDER — LABETALOL HCL 5 MG/ML IV SOLN: 20.0000 mg | Freq: Once | INTRAVENOUS | Status: DC

## 2022-03-25 MED ORDER — GADOBUTROL 1 MMOL/ML IV SOLN
6.5000 mL | Freq: Once | INTRAVENOUS | Status: AC | PRN
  Administered 2022-03-25: 6.5 mL via INTRAVENOUS

## 2022-03-25 MED ORDER — CHLORHEXIDINE GLUCONATE CLOTH 2 % EX PADS
6.0000 | MEDICATED_PAD | Freq: Every day | CUTANEOUS | Status: DC
  Administered 2022-03-26 (×2): 6 via TOPICAL

## 2022-03-25 MED ORDER — DIPHENHYDRAMINE HCL 50 MG/ML IJ SOLN
12.5000 mg | Freq: Once | INTRAMUSCULAR | Status: AC
  Administered 2022-03-25: 12.5 mg via INTRAVENOUS
  Filled 2022-03-25: qty 1

## 2022-03-25 MED ORDER — GABAPENTIN 300 MG PO CAPS
300.0000 mg | ORAL_CAPSULE | Freq: Every day | ORAL | Status: DC
Start: 2022-03-25 — End: 2022-04-02
  Administered 2022-03-25 – 2022-04-01 (×8): 300 mg via ORAL
  Filled 2022-03-25 (×8): qty 1

## 2022-03-25 MED ORDER — CLEVIDIPINE BUTYRATE 0.5 MG/ML IV EMUL
INTRAVENOUS | Status: AC
  Administered 2022-03-25: 1 mg/h via INTRAVENOUS
  Filled 2022-03-25: qty 50

## 2022-03-25 MED ORDER — ACETAMINOPHEN 325 MG PO TABS
650.0000 mg | ORAL_TABLET | ORAL | Status: DC | PRN
  Administered 2022-03-25 – 2022-04-01 (×4): 650 mg via ORAL
  Filled 2022-03-25 (×4): qty 2

## 2022-03-25 MED ORDER — DILTIAZEM HCL ER COATED BEADS 120 MG PO CP24
120.0000 mg | ORAL_CAPSULE | Freq: Every evening | ORAL | Status: DC
  Administered 2022-03-25 – 2022-04-01 (×7): 120 mg via ORAL
  Filled 2022-03-25 (×8): qty 1

## 2022-03-25 MED ORDER — FENTANYL CITRATE (PF) 100 MCG/2ML IJ SOLN
INTRAMUSCULAR | Status: AC
  Administered 2022-03-25: 12.5 ug
  Filled 2022-03-25: qty 2

## 2022-03-25 MED ORDER — LEVOTHYROXINE SODIUM 50 MCG PO TABS
50.0000 ug | ORAL_TABLET | Freq: Every day | ORAL | Status: DC
  Administered 2022-03-26 – 2022-04-02 (×8): 50 ug via ORAL
  Filled 2022-03-25 (×8): qty 1

## 2022-03-25 MED ORDER — CHLORHEXIDINE GLUCONATE 0.12 % MT SOLN
15.0000 mL | Freq: Two times a day (BID) | OROMUCOSAL | Status: DC
  Administered 2022-03-25 – 2022-04-02 (×17): 15 mL via OROMUCOSAL
  Filled 2022-03-25 (×16): qty 15

## 2022-03-25 MED ORDER — PANTOPRAZOLE SODIUM 40 MG PO TBEC
40.0000 mg | DELAYED_RELEASE_TABLET | Freq: Every day | ORAL | Status: DC
  Administered 2022-03-25 – 2022-04-02 (×9): 40 mg via ORAL
  Filled 2022-03-25 (×9): qty 1

## 2022-03-25 MED ORDER — SENNOSIDES-DOCUSATE SODIUM 8.6-50 MG PO TABS
1.0000 | ORAL_TABLET | Freq: Two times a day (BID) | ORAL | Status: DC
  Administered 2022-03-25 – 2022-04-02 (×17): 1 via ORAL
  Filled 2022-03-25 (×17): qty 1

## 2022-03-25 MED ORDER — LOSARTAN POTASSIUM 50 MG PO TABS
100.0000 mg | ORAL_TABLET | Freq: Every day | ORAL | Status: DC
  Administered 2022-03-25 – 2022-03-28 (×4): 100 mg via ORAL
  Filled 2022-03-25 (×4): qty 2

## 2022-03-25 MED ORDER — STROKE: EARLY STAGES OF RECOVERY BOOK
Freq: Once | Status: AC
  Filled 2022-03-25: qty 1

## 2022-03-25 MED ORDER — CLEVIDIPINE BUTYRATE 0.5 MG/ML IV EMUL
0.0000 mg/h | INTRAVENOUS | Status: DC
  Administered 2022-03-25: 4 mg/h via INTRAVENOUS
  Administered 2022-03-25: 8 mg/h via INTRAVENOUS
  Administered 2022-03-25: 9 mg/h via INTRAVENOUS
  Administered 2022-03-25: 5 mg/h via INTRAVENOUS
  Administered 2022-03-25: 4 mg/h via INTRAVENOUS
  Administered 2022-03-25: 6 mg/h via INTRAVENOUS
  Administered 2022-03-26: 4 mg/h via INTRAVENOUS
  Filled 2022-03-25 (×7): qty 50

## 2022-03-25 MED ORDER — ORAL CARE MOUTH RINSE
15.0000 mL | Freq: Two times a day (BID) | OROMUCOSAL | Status: DC
  Administered 2022-03-25 – 2022-04-02 (×14): 15 mL via OROMUCOSAL

## 2022-03-25 MED ORDER — SODIUM CHLORIDE 0.9% FLUSH: 3.0000 mL | Freq: Once | INTRAVENOUS | Status: DC

## 2022-03-25 MED ORDER — NADOLOL 40 MG PO TABS
80.0000 mg | ORAL_TABLET | Freq: Every day | ORAL | Status: DC
  Administered 2022-03-25 – 2022-04-02 (×9): 80 mg via ORAL
  Filled 2022-03-25 (×9): qty 2

## 2022-03-25 MED ORDER — FENTANYL CITRATE PF 50 MCG/ML IJ SOSY: 50.0000 ug | PREFILLED_SYRINGE | Freq: Once | INTRAMUSCULAR | Status: DC

## 2022-03-25 MED ORDER — PANTOPRAZOLE SODIUM 40 MG IV SOLR: 40.0000 mg | Freq: Every day | INTRAVENOUS | Status: DC

## 2022-03-25 MED ORDER — EMPTY CONTAINERS FLEXIBLE MISC
900.0000 mg | Freq: Once | Status: AC
  Administered 2022-03-25: 900 mg via INTRAVENOUS
  Filled 2022-03-25: qty 90

## 2022-03-25 MED ORDER — ACETAMINOPHEN 160 MG/5ML PO SOLN: 650.0000 mg | ORAL | Status: DC | PRN

## 2022-03-25 MED ORDER — HYDRALAZINE HCL 20 MG/ML IJ SOLN
10.0000 mg | INTRAMUSCULAR | Status: DC | PRN
  Administered 2022-03-26: 10 mg via INTRAVENOUS
  Filled 2022-03-25: qty 1

## 2022-03-25 MED ORDER — LABETALOL HCL 5 MG/ML IV SOLN
20.0000 mg | Freq: Once | INTRAVENOUS | Status: AC
  Administered 2022-03-25: 20 mg via INTRAVENOUS

## 2022-03-25 MED ORDER — FENTANYL CITRATE (PF) 100 MCG/2ML IJ SOLN
INTRAMUSCULAR | Status: AC
  Filled 2022-03-25: qty 2

## 2022-03-25 MED ORDER — LABETALOL HCL 5 MG/ML IV SOLN
10.0000 mg | INTRAVENOUS | Status: DC | PRN
  Administered 2022-03-26 (×2): 10 mg via INTRAVENOUS
  Filled 2022-03-25 (×2): qty 4

## 2022-03-25 MED ORDER — AMLODIPINE BESYLATE 10 MG PO TABS
10.0000 mg | ORAL_TABLET | Freq: Every day | ORAL | Status: DC
  Administered 2022-03-25: 10 mg via ORAL
  Filled 2022-03-25: qty 1

## 2022-03-25 MED ORDER — EMPTY CONTAINERS FLEXIBLE MISC
1800.0000 mg | Freq: Once | Status: DC
  Filled 2022-03-25: qty 180

## 2022-03-25 NOTE — Code Documentation (Signed)
Responded to Code Stroke called for R facial droop, R sided weakness, and slurred speech, LSN-2000. Pt arrived at 0226, CBG-116, NIH-15, CT head-Acute intraparenchymal hemorrhage centered at the L thalamus. SBP-222, HR-75. '20mg'$  labetalol given at 0238 and cleviprex gtt started at 0242. Plan Andexxa and ICU admission.   ?

## 2022-03-25 NOTE — Progress Notes (Signed)
VASCULAR LAB ? ? ? ?Carotid duplex has been performed. ? ?See CV proc for preliminary results. ? ? ?Brenlynn Fake, RVT ?03/25/2022, 1:32 PM ? ?

## 2022-03-25 NOTE — Progress Notes (Addendum)
STROKE TEAM PROGRESS NOTE  ? ?INTERVAL HISTORY ?Patient is seen in her room with her three children at the bedside.  Yesterday, she experienced acute onset right sided weakness, aphasia and slurred speech.  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  Her Eliquis was reversed with Andexxa, and she was placed on Cleviprex for BP control.  She has passed her swallow study, so will start PO antihypertensives. ? ?Vitals:  ? 03/25/22 0910 03/25/22 0925 03/25/22 0930 03/25/22 1000  ?BP: (!) 141/89 131/75 134/72 (!) 162/80  ?Pulse: 75 76 (!) 115   ?Resp:   10 16  ?Temp:      ?TempSrc:      ?SpO2: 99% (!) 79% (!) 79%   ?Weight:      ?Height:      ? ?CBC:  ?Recent Labs  ?Lab 03/25/22 ?0250 03/25/22 ?0255  ?WBC 5.3  --   ?NEUTROABS 2.8  --   ?HGB 9.4* 11.9*  ?HCT 28.7* 35.0*  ?MCV 91.1  --   ?PLT 208  --   ? ?Basic Metabolic Panel:  ?Recent Labs  ?Lab 03/25/22 ?0250 03/25/22 ?0255  ?NA 143 144  ?K 3.8 3.8  ?CL 110 108  ?CO2 25  --   ?GLUCOSE 116* 117*  ?BUN 19 20  ?CREATININE 0.87 0.90  ?CALCIUM 9.2  --   ? ?Lipid Panel: No results for input(s): CHOL, TRIG, HDL, CHOLHDL, VLDL, LDLCALC in the last 168 hours. ?HgbA1c: No results for input(s): HGBA1C in the last 168 hours. ?Urine Drug Screen: No results for input(s): LABOPIA, COCAINSCRNUR, LABBENZ, AMPHETMU, THCU, LABBARB in the last 168 hours.  ?Alcohol Level No results for input(s): ETH in the last 168 hours. ? ?IMAGING past 24 hours ?CT HEAD WO CONTRAST ? ?Result Date: 03/25/2022 ?CLINICAL DATA:  Stroke, hemorrhagic EXAM: CT HEAD WITHOUT CONTRAST TECHNIQUE: Contiguous axial images were obtained from the base of the skull through the vertex without intravenous contrast. RADIATION DOSE REDUCTION: This exam was performed according to the departmental dose-optimization program which includes automated exposure control, adjustment of the mA and/or kV according to patient size and/or use of iterative reconstruction technique. COMPARISON:  Earlier same day FINDINGS: Brain: Acute  parenchymal hemorrhage centered in the left thalamus and adjacent white matter is again identified. Size is similar. There is slightly increased intraventricular extension of hemorrhage. Similar surrounding edema with associated mild mass effect. No hydrocephalus. No new loss of gray-white differentiation. Stable findings of probable chronic microvascular ischemic changes in the cerebral white matter. Vascular: No new findings. Skull: Calvarium is unremarkable. Sinuses/Orbits: No acute finding. Other: None. IMPRESSION: Similar left thalamic hemorrhage and associated edema and mild mass effect. Slightly increased intraventricular extension. No hydrocephalus. Electronically Signed   By: Macy Mis M.D.   On: 03/25/2022 08:18  ? ?MR ANGIO HEAD WO CONTRAST ? ?Result Date: 03/25/2022 ?CLINICAL DATA:  Hemorrhagic stroke EXAM: MRI HEAD WITHOUT AND WITH CONTRAST MRA HEAD WITHOUT CONTRAST TECHNIQUE: Multiplanar, multi-echo pulse sequences of the brain and surrounding structures were acquired without and with intravenous contrast. Angiographic images of the Circle of Willis were acquired using MRA technique without intravenous contrast. CONTRAST:  6.36m GADAVIST GADOBUTROL 1 MMOL/ML IV SOLN COMPARISON:  Correlation made with recent CT imaging FINDINGS: MRI HEAD FINDINGS Motion artifact is present. Brain: Acute hemorrhage is again identified centered within the left thalamus with surrounding edema and intraventricular extension. There is mild mass effect. There is no abnormal enhancement within the above limitation. Patchy and confluent areas of T2 hyperintensity in  the supratentorial and pontine white matter are nonspecific but probably reflect mild to moderate chronic microvascular ischemic changes. There is no acute infarction. No evidence of chronic hemorrhage. There is no hydrocephalus or extra-axial fluid collection. Vascular: Major vessel flow voids at the skull base are preserved. Skull and upper cervical spine:  Normal marrow signal is preserved. Sinuses/Orbits: Paranasal sinuses are aerated. Bilateral lens replacements. Other: Sella is partially empty.  Mastoid air cells are clear. MRA HEAD FINDINGS Motion artifact is present. Anterior circulation: Intracranial internal carotid arteries are patent. Anterior and middle cerebral arteries are patent. No significant stenosis identified within the above limitation. Posterior circulation: Intracranial vertebral arteries, basilar artery, and posterior cerebral arteries are patent. Left posterior communicating artery is identified. No significant stenosis identified within the above limitation. No flow related enhancement in the area of hemorrhage. IMPRESSION: Motion artifact present. Evolving recent left thalamic hemorrhage with intraventricular extension and mild edema and mass effect. No evidence of underlying lesion. No significant vascular abnormality. Chronic microvascular ischemic changes. Electronically Signed   By: Macy Mis M.D.   On: 03/25/2022 09:31  ? ?MR BRAIN W WO CONTRAST ? ?Result Date: 03/25/2022 ?CLINICAL DATA:  Hemorrhagic stroke EXAM: MRI HEAD WITHOUT AND WITH CONTRAST MRA HEAD WITHOUT CONTRAST TECHNIQUE: Multiplanar, multi-echo pulse sequences of the brain and surrounding structures were acquired without and with intravenous contrast. Angiographic images of the Circle of Willis were acquired using MRA technique without intravenous contrast. CONTRAST:  6.43m GADAVIST GADOBUTROL 1 MMOL/ML IV SOLN COMPARISON:  Correlation made with recent CT imaging FINDINGS: MRI HEAD FINDINGS Motion artifact is present. Brain: Acute hemorrhage is again identified centered within the left thalamus with surrounding edema and intraventricular extension. There is mild mass effect. There is no abnormal enhancement within the above limitation. Patchy and confluent areas of T2 hyperintensity in the supratentorial and pontine white matter are nonspecific but probably reflect mild  to moderate chronic microvascular ischemic changes. There is no acute infarction. No evidence of chronic hemorrhage. There is no hydrocephalus or extra-axial fluid collection. Vascular: Major vessel flow voids at the skull base are preserved. Skull and upper cervical spine: Normal marrow signal is preserved. Sinuses/Orbits: Paranasal sinuses are aerated. Bilateral lens replacements. Other: Sella is partially empty.  Mastoid air cells are clear. MRA HEAD FINDINGS Motion artifact is present. Anterior circulation: Intracranial internal carotid arteries are patent. Anterior and middle cerebral arteries are patent. No significant stenosis identified within the above limitation. Posterior circulation: Intracranial vertebral arteries, basilar artery, and posterior cerebral arteries are patent. Left posterior communicating artery is identified. No significant stenosis identified within the above limitation. No flow related enhancement in the area of hemorrhage. IMPRESSION: Motion artifact present. Evolving recent left thalamic hemorrhage with intraventricular extension and mild edema and mass effect. No evidence of underlying lesion. No significant vascular abnormality. Chronic microvascular ischemic changes. Electronically Signed   By: PMacy MisM.D.   On: 03/25/2022 09:31  ? ?CT HEAD CODE STROKE WO CONTRAST ? ?Result Date: 03/25/2022 ?CLINICAL DATA:  Code stroke. Initial evaluation for acute neuro deficit, right-sided deficits, slurred speech. EXAM: CT HEAD WITHOUT CONTRAST TECHNIQUE: Contiguous axial images were obtained from the base of the skull through the vertex without intravenous contrast. RADIATION DOSE REDUCTION: This exam was performed according to the departmental dose-optimization program which includes automated exposure control, adjustment of the mA and/or kV according to patient size and/or use of iterative reconstruction technique. COMPARISON:  Prior CT from 12/02/2020. FINDINGS: Brain: Acute  intraparenchymal hemorrhage centered at the left  thalamus measures 2.2 x 2.2 x 2.6 cm (estimated volume 6 mL). Mild localized edema without significant regional mass effect or midline shift. Associated intraventricular

## 2022-03-25 NOTE — Progress Notes (Signed)
Neuro exam at 1700 found pt more drowsy, but neuro exam unchanged. Neuro exam at 1800 found pt with difficulty swallowing pills, unable to follow commands other than holding her LUE up to assess for drift. Dr. Erlinda Hong notified of neuro change and order received for stat head CT. Daughter at bedside and aware of situation and plan. Will con't to monitor ?

## 2022-03-25 NOTE — Evaluation (Signed)
Speech Language Pathology Evaluation ?Patient Details ?Name: [name] ?MRN: [id number] ?DOB: 1923-09-20 ?Today's Date: 03/25/2022 ?Time: 8416-6063 ?SLP Time Calculation (min) (ACUTE ONLY): 24 min ? ?Problem List:  ?Patient Active Problem List  ? Diagnosis Date Noted  ? ICH (intracerebral hemorrhage) ([place]) 03/25/2022  ? Osteoporosis 10/24/2021  ? Chronic anticoagulation 01/17/2021  ? Parastomal hernia s/p colostomy takedown & repair 01/17/2021 01/17/2021  ? Polymyalgia rheumatica ([place]) 01/11/2021  ? (HFpEF) heart failure with preserved ejection fraction ([place]) 10/27/2020  ? Injury of extensor or abductor muscle, fascia, or tendon of thumb at forearm level 08/03/2020  ? Orthostatic hypotension 05/24/2020  ? Acute blood loss anemia 05/19/2020  ? Traumatic hematoma of left hip 05/19/2020  ? Current chronic use of systemic steroids 05/19/2020  ? Advanced age 68/12/2019  ? Secondary hypercoagulable state ([place]) 03/08/2020  ? Hypokalemia 02/15/2020  ? History of diverticulitis of colon with perforation 01/26/2020  ? Anemia   ? Debility 01/14/2020  ? Physical debility 01/14/2020  ? Chronic atrial fibrillation (HCC)   ? Recurrent falls 12/29/2019  ? Positive ANA (antinuclear antibody) 12/29/2019  ? Osteoarthritis 11/13/2017  ? Hypertriglyceridemia 01/29/2017  ? Excessive gas 01/09/2017  ? Physical exam 07/31/2016  ? Elevated lipase 03/06/2016  ? Chest pain 03/06/2016  ? Hypothyroidism 01/26/2016  ? HTN (hypertension) 01/26/2016  ? Left leg numbness 10/06/2012  ? ?Past Medical History:  ?Past Medical History:  ?Diagnosis Date  ? Closed fracture of left distal radius 12/29/2019  ? DVT (deep venous thrombosis) ([place]) 09/2015  ? RLE  ? Dysrhythmia   ? Afib  ? GERD (gastroesophageal reflux disease)   ? Hip fracture ([place]) 12/02/2020  ? Hypertension   ? Hypothyroidism   ? Melanoma ([place]) 06/16/2017  ? 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 Peripheral vascular disease ([place])   ? peripheral neuropathy  ? ?Past Surgical History:  ?Past  Surgical History:  ?Procedure Laterality Date  ? ABDOMINAL HYSTERECTOMY    ? APPENDECTOMY    ? BACK SURGERY    ? BREAST SURGERY    ? left breast biopsy  ? CARDIOVERSION N/A 05/09/2020  ? Procedure: CARDIOVERSION;  Surgeon: Skeet Latch, MD;  Location: [facility];  Service: Cardiovascular;  Laterality: N/A;  ? COLOSTOMY N/A 01/06/2020  ? Procedure: Colostomy;  Surgeon: Greer Pickerel, MD;  Location: [facility];  Service: General;  Laterality: N/A;  ? EYE SURGERY    ? cataract  ? HARDWARE REMOVAL Left 01/01/2020  ? Procedure: HARDWARE REMOVAL;  Surgeon: Iran Planas, MD;  Location: [facility];  Service: Orthopedics;  Laterality: Left;  ? HIP ARTHROPLASTY Left 12/03/2020  ? Procedure: ARTHROPLASTY BIPOLAR HIP (HEMIARTHROPLASTY);  Surgeon: Renette Butters, MD;  Location: [facility];  Service: Orthopedics;  Laterality: Left;  ? HIP ARTHROPLASTY Left   ? partial  ? IR KYPHO EA ADDL LEVEL THORACIC OR LUMBAR  06/27/2021  ? IR RADIOLOGIST EVAL & MGMT  07/13/2021  ? LAPAROTOMY N/A 01/06/2020  ? Procedure: Exploratory Laparotomy, Open Sigmoid colectomy;  Surgeon: Greer Pickerel, MD;  Location: [facility];  Service: General;  Laterality: N/A;  ? LIPOMA EXCISION Left 04/25/2015  ? Procedure: EXCISION OF LIPOMA LEFT ARM;  Surgeon: Donnie Mesa, MD;  Location: [facility];  Service: General;  Laterality: Left;  ? LUMBAR LAMINECTOMY/DECOMPRESSION MICRODISCECTOMY  11/20/2011  ? Procedure: LUMBAR LAMINECTOMY/DECOMPRESSION MICRODISCECTOMY;  Surgeon: Laurice Record [place];  Location: [facility];  Service: Orthopedics;  Laterality: N/A;  Decompressive Laminectomy L2 to the Sacrum (X-Ray)  ? LYSIS OF ADHESION N/A  01/17/2021  ? Procedure: RIGID PROCTOSCOPY;  Surgeon: Michael Boston, MD;  Location: [facility];  Service: General;  Laterality: N/A;  ? OPEN REDUCTION INTERNAL FIXATION (ORIF) DISTAL RADIAL FRACTURE Left 01/01/2020  ? Procedure: OPEN REDUCTION INTERNAL FIXATION (ORIF) DISTAL RADIAL FRACTURE;  Surgeon: Iran Planas, MD;  Location: [facility];  Service:  Orthopedics;  Laterality: Left;  ? OTHER SURGICAL HISTORY    ? left wrist surgery - has plate in left wrist   ? OTHER SURGICAL HISTORY    ? right knee surgery due to torn cartilage   ? TONSILLECTOMY    ? WRIST FRACTURE SURGERY Left   ? XI ROBOTIC ASSISTED COLOSTOMY TAKEDOWN N/A 01/17/2021  ? Procedure: XI ROBOTIC ASSISTED OSTOMY TAKEDOWN, LYSIS OF ADHESIONS, RECTOSIGMOID RESECTION, BILATERAL TAP BLOCK;  Surgeon: Michael Boston, MD;  Location: [facility];  Service: General;  Laterality: N/A;  ? ?HPI:  ?[name] is a 88 y.o. female with PMH significant for DVT, HTN, Hypothyroidism, GERD, peripheral vascular disease, Afibb on Eliquis who presents with R sided weakness, aphasia/speech apraxia and slurred speech. HEAD CT revealed left thalamic hemorrhage and associated edema and mild mass  effect. Slightly increased intraventricular extension.  ? ?Assessment / Plan / Recommendation ?Clinical Impression ? Pt presents with a moderate dysarthria and at least mod-severe expressive aphasia. Speech production is c/b reduced precision of consonants. She is perseverating frequently, but appears to have some level of insight into perseveration and will shake her head. She benefits greatly from visual cues of initial phoneme placement for production. Automatic speech was in tact for counting and finishing familiar phrases. Initiation appears impaired. She was noted to perseverate while following commands as well. Receptive language appeared to be a strength, as her simple yes/no was 100% accurate. Complex yes/no was 50% accurate, as pt appeared to be perseverating on "no." Pt fatigued greatly as session progressed, but was very stimulable and given prior level of function (independent) is a great candidate for ongoing therapy. Acute service to follow 2x/wk for 2 weeks with present recommendations for acute inpatient rehabilitation. Pt/family in agreement with plan. ? ?Pt/family education completed for communication strategies  including redirecting her perseveration and providing initial phoneme placement cue.  ?   ?SLP Assessment ? SLP Recommendation/Assessment: Patient needs continued [facility] ?SLP Visit Diagnosis: Dysphagia, oral phase (R13.11);Dysarthria and anarthria (R47.1);Aphasia (R47.01)  ?  ?Recommendations for follow up therapy are one component of a multi-disciplinary discharge planning process, led by the attending physician.  Recommendations may be updated based on patient status, additional functional criteria and insurance authorization. ?   ?Follow Up Recommendations ? Acute inpatient rehab (3hours/day)  ?  ?Assistance Recommended at Discharge ?    ?Functional Status Assessment Patient has had a recent decline in their functional status and demonstrates the ability to make significant improvements in function in a reasonable and predictable amount of time.  ?Frequency and Duration min 2x/week  ?2 weeks ?  ?   ?SLP Evaluation ?Cognition ? Overall Cognitive Status: Difficult to assess ?Arousal/Alertness: Lethargic ?Orientation Level: Oriented to person ?Awareness: Appears intact  ?  ?   ?Comprehension ? Auditory Comprehension ?Overall Auditory Comprehension: Appears within functional limits for tasks assessed ?Yes/No Questions: Within Functional Limits ?Commands: Impaired ?Conversation: Complex ?EffectiveTechniques: Repetition;Slowed speech ?Visual Recognition/Discrimination ?Discrimination: Not tested ?Reading Comprehension ?Reading Status: Not tested  ?  ?Expression Expression ?Primary Mode of Expression: Verbal ?Verbal Expression ?Overall Verbal Expression: Impaired ?Initiation: Impaired ?Automatic Speech: Name ?Repetition: Impaired ?Level of Impairment: Phrase level ?Naming:  Impairment ?Responsive: 26-50% accurate ?Confrontation: Impaired ?Convergent: 0-24% accurate ?Divergent: 0-24% accurate ?Pragmatics: No impairment   ?Oral / Motor ? Oral Motor/Sensory Function ?Overall Oral Motor/Sensory  Function: Mild impairment ?Facial ROM: Reduced right ?Facial Symmetry: Abnormal symmetry right ?Facial Strength: Reduced right ?Facial Sensation: Reduced right ?Lingual ROM: Reduced right ?Lingual Symmetry: Abno

## 2022-03-25 NOTE — ED Provider Notes (Signed)
?[facility] ?Provider Note ? ?CSN: [id number] ?Arrival date & time: 03/25/22 6333 ? ?Chief Complaint(s) ?Code Stroke ? ?HPI ?[name] is a 88 y.o. female with a past medical history listed below including prior DVTs on Eliquis who presents to the emergency department as a code stroke for right-sided deficits and aphasia.  Last known normal around 8 PM. ? ?HPI ? ?Past Medical History ?Past Medical History:  ?Diagnosis Date  ? Closed fracture of left distal radius 12/29/2019  ? DVT (deep venous thrombosis) ([place]) 09/2015  ? RLE  ? Dysrhythmia   ? Afib  ? GERD (gastroesophageal reflux disease)   ? Hip fracture ([place]) 12/02/2020  ? Hypertension   ? Hypothyroidism   ? Melanoma ([place]) 06/16/2017  ? 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 Peripheral vascular disease ([place])   ? peripheral neuropathy  ? ?Patient Active Problem List  ? Diagnosis Date Noted  ? ICH (intracerebral hemorrhage) ([place]) 03/25/2022  ? Osteoporosis 10/24/2021  ? Chronic anticoagulation 01/17/2021  ? Parastomal hernia s/p colostomy takedown & repair 01/17/2021 01/17/2021  ? Polymyalgia rheumatica ([place]) 01/11/2021  ? (HFpEF) heart failure with preserved ejection fraction ([place]) 10/27/2020  ? Injury of extensor or abductor muscle, fascia, or tendon of thumb at forearm level 08/03/2020  ? Orthostatic hypotension 05/24/2020  ? Acute blood loss anemia 05/19/2020  ? Traumatic hematoma of left hip 05/19/2020  ? Current chronic use of systemic steroids 05/19/2020  ? Advanced age 31/12/2019  ? Secondary hypercoagulable state ([place]) 03/08/2020  ? Hypokalemia 02/15/2020  ? History of diverticulitis of colon with perforation 01/26/2020  ? Anemia   ? Debility 01/14/2020  ? Physical debility 01/14/2020  ? Chronic atrial fibrillation ([facility])   ? Recurrent falls 12/29/2019  ? Positive ANA (antinuclear antibody) 12/29/2019  ? Osteoarthritis 11/13/2017  ? Hypertriglyceridemia 01/29/2017  ? Excessive gas 01/09/2017  ? Physical exam  07/31/2016  ? Elevated lipase 03/06/2016  ? Chest pain 03/06/2016  ? Hypothyroidism 01/26/2016  ? HTN (hypertension) 01/26/2016  ? Left leg numbness 10/06/2012  ? ?Home Medication(s) ?Prior to Admission medications   ?Medication Sig Start Date End Date Taking? Authorizing Provider  ?acetaminophen (TYLENOL) 500 MG tablet Take 500 mg by mouth at bedtime.   Yes [provider]  ?apixaban (ELIQUIS) 5 MG TABS tablet Take 1 tablet (5 mg total) by mouth 2 (two) times daily. 09/05/21  Yes Deboraha Sprang, MD  ?diltiazem (CARDIZEM CD) 120 MG 24 hr capsule TAKE 1 CAPSULE BY MOUTH EVERY DAY ?Patient taking differently: Take 120 mg by mouth every evening. 03/20/22  Yes [name], MD  ?docusate sodium (COLACE) 100 MG capsule Take 1 capsule (100 mg total) by mouth 2 (two) times daily. 01/20/20  Yes Love, Ivan Anchors, PA-C  ?gabapentin (NEURONTIN) 300 MG capsule TAKE 1 CAPSULE BY MOUTH EVERYDAY AT BEDTIME ?Patient taking differently: Take 300 mg by mouth at bedtime. 11/26/21  Yes [name], MD  ?levothyroxine (SYNTHROID) 50 MCG tablet TAKE 1 TABLET BY MOUTH EVERY DAY ?Patient taking differently: Take 50 mcg by mouth daily before breakfast. 11/26/21  Yes [name], MD  ?losartan (COZAAR) 50 MG tablet Take 2 tablets (100 mg total) by mouth daily. 03/12/22  Yes Maximiano Coss, NP  ?Multiple Vitamins-Minerals (PRESERVISION AREDS 2 PO) Take 1 tablet by mouth daily with lunch.   Yes [provider]  ?nadolol (CORGARD) 80 MG tablet Take 1 tablet (80 mg total) by mouth daily. 07/26/21  Yes Annye Asa  E, MD  ?pantoprazole (PROTONIX) 40 MG tablet TAKE 1 TABLET BY MOUTH EVERY DAY ?Patient taking differently: Take 40 mg by mouth daily. 10/09/21  Yes [name], MD  ?predniSONE (DELTASONE) 1 MG tablet Take 4 tablets (4 mg total) by mouth daily with lunch. ?Patient taking differently: Take 2 mg by mouth daily with lunch. 12/07/20  Yes Kc, Maren Beach, MD  ?Vitamin D, Ergocalciferol, (DRISDOL)  1.25 MG (50000 UNIT) CAPS capsule Take 1 capsule (50,000 Units total) by mouth every 7 (seven) days. ?Patient taking differently: Take 50,000 Units by mouth every 7 (seven) days. No set day 10/25/21  Yes [name], MD  ?                                                                                                                                  ?Allergies ?Keflex [cephalexin] and Codeine ? ?Review of Systems ?Review of Systems ?As noted in HPI ? ?Physical Exam ?Vital Signs  ?I have reviewed the triage vital signs ?BP (!) 142/67   Pulse 75   Resp (!) 25   Ht '5\' 6"'$  (1.676 m)   Wt 66 kg   SpO2 97%   BMI 23.48 kg/m?  ? ?Physical Exam ?Vitals reviewed.  ?Constitutional:   ?   General: She is not in acute distress. ?   Appearance: She is well-developed. She is not diaphoretic.  ?HENT:  ?   Head: Normocephalic and atraumatic.  ?   Nose: Nose normal.  ?Eyes:  ?   General: No scleral icterus.    ?   Right eye: No discharge.     ?   Left eye: No discharge.  ?   Conjunctiva/sclera: Conjunctivae normal.  ?   Pupils: Pupils are equal, round, and reactive to light.  ?Cardiovascular:  ?   Rate and Rhythm: Normal rate and regular rhythm.  ?   Heart sounds: No murmur heard. ?  No friction rub. No gallop.  ?Pulmonary:  ?   Effort: Pulmonary effort is normal. No respiratory distress.  ?   Breath sounds: Normal breath sounds. No stridor. No rales.  ?Abdominal:  ?   General: There is no distension.  ?   Palpations: Abdomen is soft.  ?   Tenderness: There is no abdominal tenderness.  ?Musculoskeletal:     ?   General: No tenderness.  ?   Cervical back: Normal range of motion and neck supple.  ?Skin: ?   General: Skin is warm and dry.  ?   Findings: No erythema or rash.  ?Neurological:  ?   Mental Status: She is alert.  ?   Comments: Aphasic ?Right facial droop ?Right sided weakness  ? ? ?ED Results and Treatments ?Labs ?(all labs ordered are listed, but only abnormal results are displayed) ?Labs Reviewed  ?CBC -  Abnormal; Notable for the following components:  ?    Result Value  ? RBC 3.15 (*)   ?  Hemoglobin 9.4 (*)   ? HCT 28.7 (*)   ? All other components within normal limits  ?I-STAT CHEM 8, ED - Abnormal; Notable for the following components:  ? Glucose, Bld 117 (*)   ? Calcium, Ion 1.14 (*)   ? Hemoglobin 11.9 (*)   ? HCT 35.0 (*)   ? All other components within normal limits  ?CBG MONITORING, ED - Abnormal; Notable for the following components:  ? Glucose-Capillary 116 (*)   ? All other components within normal limits  ?DIFFERENTIAL  ?PROTIME-INR  ?APTT  ?COMPREHENSIVE METABOLIC PANEL  ?                                                                                                                       ?EKG ? EKG Interpretation ? ?Date/Time:  Monday Mar 25 2022 02:49:51 EDT ?Ventricular Rate:  73 ?PR Interval:  206 ?QRS Duration: 89 ?QT Interval:  397 ?QTC Calculation: 438 ?R Axis:   -48 ?Text Interpretation: Sinus rhythm Left anterior fascicular block Abnormal R-wave progression, early transition Confirmed by Addison Lank [phone]) on 03/25/2022 3:01:34 AM ?  ? ?  ? ?Radiology ?CT HEAD CODE STROKE WO CONTRAST ? ?Result Date: 03/25/2022 ?CLINICAL DATA:  Code stroke. Initial evaluation for acute neuro deficit, right-sided deficits, slurred speech. EXAM: CT HEAD WITHOUT CONTRAST TECHNIQUE: Contiguous axial images were obtained from the base of the skull through the vertex without intravenous contrast. RADIATION DOSE REDUCTION: This exam was performed according to the departmental dose-optimization program which includes automated exposure control, adjustment of the mA and/or kV according to patient size and/or use of iterative reconstruction technique. COMPARISON:  Prior CT from 12/02/2020. FINDINGS: Brain: Acute intraparenchymal hemorrhage centered at the left thalamus measures 2.2 x 2.2 x 2.6 cm (estimated volume 6 mL). Mild localized edema without significant regional mass effect or midline shift. Associated  intraventricular extension with trace intraventricular blood within the lateral ventricles. No hydrocephalus or trapping. No other acute intracranial hemorrhage. No other acute large vessel territory infarct. No mass lesion or si

## 2022-03-25 NOTE — Progress Notes (Signed)
2D Echocardiogram has been performed. ? ?Di Jasmer  Romeo Rabon ?03/25/2022, 12:02 PM ?

## 2022-03-25 NOTE — TOC CAGE-AID Note (Signed)
Transition of Care (TOC) - CAGE-AID Screening ? ? ?Patient Details  ?Name: [name] ?MRN: [id number] ?Date of Birth: [birth year] ? ?Transition of Care (TOC) CM/SW Contact:    ?Faithann Natal C Tarpley-Carter, LCSWA ?Phone Number: ?03/25/2022, 1:59 PM ? ? ?Clinical Narrative: ?Pt is unable to participate in Cage Aid. ? ?Passenger transport manager, MSW, LCSW-A ?Pronouns:  She/Her/Hers ?[facility] HealthTransitions of Care ?Clinical Social Worker ?Direct Number:  [phone] ?[email or web address]  ? ?CAGE-AID Screening: ?Substance Abuse Screening unable to be completed due to: : Patient unable to participate ? ?  ?  ?  ?  ?  ? ?  ? ?  ? ? ? ? ? ? ?

## 2022-03-25 NOTE — Evaluation (Signed)
Clinical/Bedside Swallow Evaluation ?Patient Details  ?Name: [name] ?MRN: [id number] ?Date of Birth: 08-May-1923 ? ?Today's Date: 03/25/2022 ?Time: SLP Start Time (ACUTE ONLY): 0910 SLP Stop Time (ACUTE ONLY): 7517 ?SLP Time Calculation (min) (ACUTE ONLY): 10 min ? ?Past Medical History:  ?Past Medical History:  ?Diagnosis Date  ? Closed fracture of left distal radius 12/29/2019  ? DVT (deep venous thrombosis) ([place]) 09/2015  ? RLE  ? Dysrhythmia   ? Afib  ? GERD (gastroesophageal reflux disease)   ? Hip fracture ([place]) 12/02/2020  ? Hypertension   ? Hypothyroidism   ? Melanoma ([place]) 06/16/2017  ? 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 Peripheral vascular disease ([place])   ? peripheral neuropathy  ? ?Past Surgical History:  ?Past Surgical History:  ?Procedure Laterality Date  ? ABDOMINAL HYSTERECTOMY    ? APPENDECTOMY    ? BACK SURGERY    ? BREAST SURGERY    ? left breast biopsy  ? CARDIOVERSION N/A 05/09/2020  ? Procedure: CARDIOVERSION;  Surgeon: Skeet Latch, MD;  Location: [place];  Service: Cardiovascular;  Laterality: N/A;  ? COLOSTOMY N/A 01/06/2020  ? Procedure: Colostomy;  Surgeon: Greer Pickerel, MD;  Location: [place];  Service: General;  Laterality: N/A;  ? EYE SURGERY    ? cataract  ? HARDWARE REMOVAL Left 01/01/2020  ? Procedure: HARDWARE REMOVAL;  Surgeon: Iran Planas, MD;  Location: [place];  Service: Orthopedics;  Laterality: Left;  ? HIP ARTHROPLASTY Left 12/03/2020  ? Procedure: ARTHROPLASTY BIPOLAR HIP (HEMIARTHROPLASTY);  Surgeon: Renette Butters, MD;  Location: [place];  Service: Orthopedics;  Laterality: Left;  ? HIP ARTHROPLASTY Left   ? partial  ? IR KYPHO EA ADDL LEVEL THORACIC OR LUMBAR  06/27/2021  ? IR RADIOLOGIST EVAL & MGMT  07/13/2021  ? LAPAROTOMY N/A 01/06/2020  ? Procedure: Exploratory Laparotomy, Open Sigmoid colectomy;  Surgeon: Greer Pickerel, MD;  Location: [place];  Service: General;  Laterality: N/A;  ? LIPOMA EXCISION Left 04/25/2015  ? Procedure: EXCISION OF LIPOMA LEFT ARM;   Surgeon: Donnie Mesa, MD;  Location: [place];  Service: General;  Laterality: Left;  ? LUMBAR LAMINECTOMY/DECOMPRESSION MICRODISCECTOMY  11/20/2011  ? Procedure: LUMBAR LAMINECTOMY/DECOMPRESSION MICRODISCECTOMY;  Surgeon: Laurice Record [place];  Location: [facility];  Service: Orthopedics;  Laterality: N/A;  Decompressive Laminectomy L2 to the Sacrum (X-Ray)  ? LYSIS OF ADHESION N/A 01/17/2021  ? Procedure: RIGID PROCTOSCOPY;  Surgeon: Michael Boston, MD;  Location: [facility];  Service: General;  Laterality: N/A;  ? OPEN REDUCTION INTERNAL FIXATION (ORIF) DISTAL RADIAL FRACTURE Left 01/01/2020  ? Procedure: OPEN REDUCTION INTERNAL FIXATION (ORIF) DISTAL RADIAL FRACTURE;  Surgeon: Iran Planas, MD;  Location: [place];  Service: Orthopedics;  Laterality: Left;  ? OTHER SURGICAL HISTORY    ? left wrist surgery - has plate in left wrist   ? OTHER SURGICAL HISTORY    ? right knee surgery due to torn cartilage   ? TONSILLECTOMY    ? WRIST FRACTURE SURGERY Left   ? XI ROBOTIC ASSISTED COLOSTOMY TAKEDOWN N/A 01/17/2021  ? Procedure: XI ROBOTIC ASSISTED OSTOMY TAKEDOWN, LYSIS OF ADHESIONS, RECTOSIGMOID RESECTION, BILATERAL TAP BLOCK;  Surgeon: Michael Boston, MD;  Location[facility];  Service: General;  Laterality: N/A;  ? ?HPI:  ?[name] is a 88 y.o. female with PMH significant for DVT, HTN, Hypothyroidism, GERD, peripheral vascular disease, Afibb on Eliquis who presents with R sided weakness, aphasia/speech apraxia and slurred speech.  ?  ?Assessment / Plan /  Recommendation  ?Clinical Impression ? Pt presents with a mild oral dysphagia 2/2 R sided facial and lingual weakness. Overall, mastication and swallow appeared functional. Pt had mildly prolonged mastication with dry solid (graham cracker), likely exacerbated by xerostomia (been NPO). She consumed 3 oz thin liquid consecutively without difficulty. Pt independently sought out liquid wash after dry solids to achieve complete oral clearance.  ? ?Pt is easily  fatiguing (as seen as session progressed with language evaluation). Recommend dysphagia 3 solids, thin liquids, full assist/supervision for PO intake. SLP service to follow up for upgrade to regular solids as appropriate and for further speech/language evaluation/treatment. ? ? ?SLP Visit Diagnosis: Dysphagia, oral phase (R13.11) ?   ?Aspiration Risk ?    ?  ?Diet Recommendation Dysphagia 3 (Mech soft);Thin liquid  ? ?Liquid Administration via: Straw ?Medication Administration: Whole meds with liquid ?Supervision: Staff to assist with self feeding ?Compensations: Small sips/bites;Slow rate ?Postural Changes: Seated upright at 90 degrees  ?  ?Other  Recommendations Oral Care Recommendations: Oral care BID   ? ?Recommendations for follow up therapy are one component of a multi-disciplinary discharge planning process, led by the attending physician.  Recommendations may be updated based on patient status, additional functional criteria and insurance authorization. ? ?Follow up Recommendations Acute inpatient rehab (3hours/day)  ? ? ?  ?Assistance Recommended at Discharge    ?Functional Status Assessment Patient has had a recent decline in their functional status and demonstrates the ability to make significant improvements in function in a reasonable and predictable amount of time.  ?Frequency and Duration min 2x/week  ?2 weeks ?  ?   ? ?Prognosis Prognosis for Safe Diet Advancement: Good  ? ?  ? ?Swallow Study   ?General Date of Onset: 03/25/22 ?HPI: [name] is a 88 y.o. female with PMH significant for DVT, HTN, Hypothyroidism, GERD, peripheral vascular disease, Afibb on Eliquis who presents with R sided weakness, aphasia/speech apraxia and slurred speech. ?Type of Study: Bedside Swallow Evaluation ?Previous Swallow Assessment: 03/25/22 HEAD CT: Similar left thalamic hemorrhage and associated edema and mild mass  effect. Slightly increased intraventricular extension. No  hydrocephalus. ?Diet Prior to this  Study: NPO ?Temperature Spikes Noted: No ?Respiratory Status: Room air ?History of Recent Intubation: No ?Behavior/Cognition: Alert;Cooperative;Pleasant mood ?Oral Cavity Assessment: Within Functional Limits ?Oral Care Completed by SLP: No ?Oral Cavity - Dentition: Adequate natural dentition ?Vision: Functional for self-feeding ?Self-Feeding Abilities: Needs assist ?Patient Positioning: Upright in bed ?Baseline Vocal Quality: Low vocal intensity ?Volitional Cough: Weak ?Volitional Swallow: Able to elicit  ?  ?Oral/Motor/Sensory Function Overall Oral Motor/Sensory Function: Mild impairment ?Facial ROM: Reduced right ?Facial Symmetry: Abnormal symmetry right ?Facial Strength: Reduced right ?Facial Sensation: Reduced right ?Lingual ROM: Reduced right ?Lingual Symmetry: Abnormal symmetry right ?Lingual Strength: Reduced ?Lingual Sensation: Within Functional Limits ?Velum: Within Functional Limits ?Mandible: Within Functional Limits   ?Ice Chips Ice chips: Within functional limits ?Presentation: Spoon   ?Thin Liquid Thin Liquid: Within functional limits ?Presentation: Spoon;Straw  ?  ?Puree Puree: Within functional limits ?Presentation: Spoon   ?Solid ? ? ? Giovanne Nickolson P. Climmie Buelow, M.S., CCC-SLP ?Speech-Language Pathologist ?Acute Rehabilitation Services ?Pager: [phone] ? Solid: Within functional limits ?Presentation: Spoon  ? ?  ? ?[place] ?03/25/2022,10:46 AM ? ? ? ?

## 2022-03-25 NOTE — H&P (Signed)
NEUROLOGY CONSULTATION NOTE  ? ?Date of service: Mar 25, 2022 ?Patient Name: [name] ?MRN:  [id number] ?DOB:  01/20/1923 ?_ _ _   _ __   _ __ _ _  __ __   _ __   __ _ ? ?History of Present Illness  ?[name] is a 88 y.o. female with PMH significant for DVT, HTN, Hypothyroidism, GERD, peripheral vascular disease, Afibb on Eliquis who presents with R sided weakness, aphasia/speech apraxia and slurred speech. ? ?She was last seen by family at 2030 on 03/24/2022 and then pushed the button on her life alert.  Family lives 5 minutes away and went over to find her weak on the right side and immediately called EMS.  She was brought in as a code stroke for right-sided weakness, facial droop, slurred speech and expressive aphasia/speech apraxia.  Daughter confirmed the patient did take her Eliquis last night. ? ?LKW: 2030 ?mRS: 3 ?tNKASE: not offered 2/2 ICH ?Thrombectomy: not offered 2/2 ICH ?ICH score: 2(age > 80 and trace IVH) ?NIHSS components Score: Comment  ?1a Level of Conscious 0'[x]'$  1'[]'$  2'[]'$  3'[]'$      ?1b LOC Questions 0'[]'$  1'[]'$  2'[x]'$       ?1c LOC Commands 0'[x]'$  1'[]'$  2'[]'$       ?2 Best Gaze 0'[x]'$  1'[]'$  2'[]'$       ?3 Visual 0'[x]'$  1'[]'$  2'[]'$  3'[]'$      ?4 Facial Palsy 0'[]'$  1'[x]'$  2'[]'$  3'[]'$      ?5a Motor Arm - left 0'[x]'$  1'[]'$  2'[]'$  3'[]'$  4'[]'$  UN'[]'$    ?5b Motor Arm - Right 0'[]'$  1'[]'$  2'[]'$  3'[x]'$  4'[]'$  UN'[]'$    ?6a Motor Leg - Left 0'[x]'$  1'[]'$  2'[]'$  3'[]'$  4'[]'$  UN'[]'$    ?6b Motor Leg - Right 0'[]'$  1'[]'$  2'[]'$  3'[x]'$  4'[]'$  UN'[]'$    ?7 Limb Ataxia 0'[]'$  1'[x]'$  2'[]'$  3'[]'$  UN'[]'$     ?8 Sensory 0'[x]'$  1'[]'$  2'[]'$  UN'[]'$      ?9 Best Language 0'[]'$  1'[]'$  2'[]'$  3'[x]'$      ?10 Dysarthria 0'[]'$  1'[]'$  2'[x]'$  UN'[]'$      ?11 Extinct. and Inattention 0'[x]'$  1'[]'$  2'[]'$       ?TOTAL: 15   ?  ?ROS  ? ?Unable to obtain detailed review of system due to aphasia/speech apraxia. ? ?Past History  ? ?Past Medical History:  ?Diagnosis Date  ?? Closed fracture of left distal radius 12/29/2019  ?? DVT (deep venous thrombosis) ([place]) 09/2015  ? RLE  ?? Dysrhythmia   ? Afib  ?? GERD (gastroesophageal reflux disease)   ?? Hip fracture ([place])  12/02/2020  ?? Hypertension   ?? Hypothyroidism   ?? Melanoma ([place]) 06/16/2017  ? 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 Peripheral vascular disease ([place])   ? peripheral neuropathy  ? ?Past Surgical History:  ?Procedure Laterality Date  ?? ABDOMINAL HYSTERECTOMY    ?? APPENDECTOMY    ?? BACK SURGERY    ?? BREAST SURGERY    ? left breast biopsy  ?? CARDIOVERSION N/A 05/09/2020  ? Procedure: CARDIOVERSION;  Surgeon: Skeet Latch, MD;  Location: [place];  Service: Cardiovascular;  Laterality: N/A;  ?? COLOSTOMY N/A 01/06/2020  ? Procedure: Colostomy;  Surgeon: Greer Pickerel, MD;  Location: [place];  Service: General;  Laterality: N/A;  ?? EYE SURGERY    ? cataract  ?? HARDWARE REMOVAL Left 01/01/2020  ? Procedure: HARDWARE REMOVAL;  Surgeon: Iran Planas, MD;  Location: [place];  Service: Orthopedics;  Laterality: Left;  ?? HIP ARTHROPLASTY Left 12/03/2020  ? Procedure: ARTHROPLASTY BIPOLAR HIP (HEMIARTHROPLASTY);  Surgeon: Renette Butters, MD;  Location: [place];  Service: Orthopedics;  Laterality: Left;  ?? HIP ARTHROPLASTY Left   ? partial  ?? IR KYPHO EA ADDL LEVEL THORACIC OR LUMBAR  06/27/2021  ?? IR RADIOLOGIST EVAL & MGMT  07/13/2021  ?? LAPAROTOMY N/A 01/06/2020  ? Procedure: Exploratory Laparotomy, Open Sigmoid colectomy;  Surgeon: Greer Pickerel, MD;  Location: [place];  Service: General;  Laterality: N/A;  ?? LIPOMA EXCISION Left 04/25/2015  ? Procedure: EXCISION OF LIPOMA LEFT ARM;  Surgeon: Donnie Mesa, MD;  Location: [place];  Service: General;  Laterality: Left;  ?? LUMBAR LAMINECTOMY/DECOMPRESSION MICRODISCECTOMY  11/20/2011  ? Procedure: LUMBAR LAMINECTOMY/DECOMPRESSION MICRODISCECTOMY;  Surgeon: Laurice Record [place];  [facility];  Service: Orthopedics;  Laterality: N/A;  Decompressive Laminectomy L2 to the Sacrum (X-Ray)  ?? LYSIS OF ADHESION N/A 01/17/2021  ? Procedure: RIGID PROCTOSCOPY;  Surgeon: Michael Boston, MD;  [facility];  Service: General;  Laterality: N/A;  ?? OPEN  REDUCTION INTERNAL FIXATION (ORIF) DISTAL RADIAL FRACTURE Left 01/01/2020  ? Procedure: OPEN REDUCTION INTERNAL FIXATION (ORIF) DISTAL RADIAL FRACTURE;  Surgeon: Iran Planas, MD;  Location: [place];  Service: Orthopedics;  Laterality: Left;  ?? OTHER SURGICAL HISTORY    ? left wrist surgery - has plate in left wrist   ?? OTHER SURGICAL HISTORY    ? right knee surgery due to torn cartilage   ?? TONSILLECTOMY    ?? WRIST FRACTURE SURGERY Left   ?? XI ROBOTIC ASSISTED COLOSTOMY TAKEDOWN N/A 01/17/2021  ? Procedure: XI ROBOTIC ASSISTED OSTOMY TAKEDOWN, LYSIS OF ADHESIONS, RECTOSIGMOID RESECTION, BILATERAL TAP BLOCK;  Surgeon: Michael Boston, MD;  [facility];  Service: General;  Laterality: N/A;  ? ?Family History  ?Problem Relation Age of Onset  ?? Cancer Mother   ?     BREAST  ?? COPD Father   ?? Emphysema Father   ?? Cancer Daughter   ?     breast  ? ?Social History  ? ?Socioeconomic History  ?? Marital status: Widowed  ?  Spouse name: Not on file  ?? Number of children: Not on file  ?? Years of education: Not on file  ?? Highest education level: Not on file  ?Occupational History  ?? Occupation: retired  ?Tobacco Use  ?? Smoking status: Never  ?? Smokeless tobacco: Never  ?Vaping Use  ?? Vaping Use: Never used  ?Substance and Sexual Activity  ?? Alcohol use: No  ?? Drug use: No  ?? Sexual activity: Never  ?Other Topics Concern  ?? Not on file  ?Social History Narrative  ? Lots of family nearby  ? She has an aide 4 days per week  ? ?Social Determinants of Health  ? ?Financial Resource Strain: Low Risk   ?? Difficulty of Paying Living Expenses: Not hard at all  ?Food Insecurity: No Food Insecurity  ?? Worried About Charity fundraiser in the Last Year: Never true  ?? Ran Out of Food in the Last Year: Never true  ?Transportation Needs: No Transportation Needs  ?? Lack of Transportation (Medical): No  ?? Lack of Transportation (Non-Medical): No  ?Physical Activity: Insufficiently Active  ?? Days of Exercise per Week: 7  days  ?? Minutes of Exercise per Session: 20 min  ?Stress: No Stress Concern Present  ?? Feeling of Stress : Not at all  ?Social Connections: Moderately Integrated  ?? Frequency of Communication with Friends and Family: More than three times a week  ?? Frequency of Social Gatherings with Friends and  Family: Once a week  ?? Attends Religious Services: 1 to 4 times per year  ?? Active Member of Clubs or Organizations: Yes  ?? Attends Archivist Meetings: 1 to 4 times per year  ?? Marital Status: Widowed  ? ?Allergies  ?Allergen Reactions  ?? Keflex [Cephalexin] Nausea Only and Other (See Comments)  ?  Pt ended up in ER w/ CHEST PAIN  ?? Codeine Other (See Comments)  ?  "Nightmares, imagined things"  ? ? ?Medications  ?(Not in a hospital admission) ?  ? ?Vitals  ? ?Vitals:  ? 03/25/22 0200 03/25/22 0246 03/25/22 0248  ?BP:  (!) 176/87 (!) 186/88  ?Pulse:   72  ?SpO2:   98%  ?Weight: 66 kg    ?Height: '5\' 6"'$  (1.676 m)    ?  ? ?Body mass index is 23.48 kg/m?. ? ?Physical Exam  ? ?General: Laying comfortably in bed; in no acute distress.  ?HENT: Normal oropharynx and mucosa. Normal external appearance of ears and nose.  ?Neck: Supple, no pain or tenderness  ?CV: No JVD. No peripheral edema.  ?Pulmonary: Symmetric Chest rise. Normal respiratory effort.  ?Abdomen: Soft to touch, non-tender.  ?Ext: No cyanosis, edema, or deformity  ?Skin: No rash. Normal palpation of skin.   ?Musculoskeletal: Normal digits and nails by inspection. No clubbing.  ? ?Neurologic Examination  ?Mental status/Cognition: Alert, oriented to self. Unable to answer other orientation questions due to aphasia. ?Speech/language: Fluent, comprehension intact to simple commands, unable to name objects. ?Cranial nerves:  ? CN II Pupils equal and reactive to light, no VF deficits   ? CN III,IV,VI EOM intact, no gaze preference or deviation, no nystagmus   ? CN V normal sensation in V1, V2, and V3 segments bilaterally   ? CN VII R facial droop  ? CN  VIII Turns head towards speech  ? CN IX & X normal palatal elevation, no uvular deviation  ? CN XI Head midline,  ? CN XII midline tongue protrusion   ? ?Motor:  ?Muscle bulk: poor. ?Mvmt Root Nerve  Muscle Rig

## 2022-03-25 NOTE — Progress Notes (Signed)
Called ED in attempt to get report. ED RN unable to give report right now. Left number to call back when ready. ?

## 2022-03-25 NOTE — ED Triage Notes (Signed)
Per EMS, pt from home, woke up and hit her alert button around 1:45.  EMS found her w/ right sided deficits and slurred speech, right sided facial droop as well.  Pt taken to CT, O2 placed.   ? ?Lubertha Sayres, daughter [phone] ?23 G R forearm ?16G L forearm ?

## 2022-03-26 ENCOUNTER — Ambulatory Visit: Payer: Medicare PPO | Admitting: Family Medicine

## 2022-03-26 DIAGNOSIS — I161 Hypertensive emergency: Secondary | ICD-10-CM | POA: Diagnosis not present

## 2022-03-26 DIAGNOSIS — I610 Nontraumatic intracerebral hemorrhage in hemisphere, subcortical: Secondary | ICD-10-CM | POA: Diagnosis not present

## 2022-03-26 DIAGNOSIS — I615 Nontraumatic intracerebral hemorrhage, intraventricular: Secondary | ICD-10-CM | POA: Diagnosis not present

## 2022-03-26 DIAGNOSIS — I619 Nontraumatic intracerebral hemorrhage, unspecified: Secondary | ICD-10-CM | POA: Diagnosis not present

## 2022-03-26 LAB — BASIC METABOLIC PANEL
Anion gap: 6 (ref 5–15)
BUN: 11 mg/dL (ref 8–23)
CO2: 26 mmol/L (ref 22–32)
Calcium: 8.9 mg/dL (ref 8.9–10.3)
Chloride: 107 mmol/L (ref 98–111)
Creatinine, Ser: 0.77 mg/dL (ref 0.44–1.00)
GFR, Estimated: >60 mL/min (ref >60)
Glucose, Bld: 102 mg/dL — ABNORMAL HIGH (ref 70–99)
Potassium: 3.6 mmol/L (ref 3.5–5.1)
Sodium: 139 mmol/L (ref 135–145)

## 2022-03-26 LAB — CBC
HCT: 36.6 % (ref 36.0–46.0)
Hemoglobin: 11.4 g/dL — ABNORMAL LOW (ref 12.0–15.0)
MCH: 28.3 pg (ref 26.0–34.0)
MCHC: 31.1 g/dL (ref 30.0–36.0)
MCV: 90.8 fL (ref 80.0–100.0)
Platelets: 272 10*3/uL (ref 150–400)
RBC: 4.03 MIL/uL (ref 3.87–5.11)
RDW: 14.6 % (ref 11.5–15.5)
WBC: 10.3 10*3/uL (ref 4.0–10.5)
nRBC: 0 % (ref 0.0–0.2)

## 2022-03-26 LAB — LIPID PANEL
Cholesterol: 177 mg/dL (ref 0–200)
HDL: 53 mg/dL (ref >40)
LDL Cholesterol: 94 mg/dL (ref 0–99)
Total CHOL/HDL Ratio: 3.3 RATIO
Triglycerides: 150 mg/dL — ABNORMAL HIGH (ref <150)
VLDL: 30 mg/dL (ref 0–40)

## 2022-03-26 LAB — HEMOGLOBIN A1C
Hgb A1c MFr Bld: 5.9 % — ABNORMAL HIGH (ref 4.8–5.6)
Mean Plasma Glucose: 122.63 mg/dL

## 2022-03-26 MED ORDER — POTASSIUM CHLORIDE CRYS ER 20 MEQ PO TBCR
40.0000 meq | EXTENDED_RELEASE_TABLET | Freq: Once | ORAL | Status: AC
  Administered 2022-03-26: 40 meq via ORAL
  Filled 2022-03-26: qty 2

## 2022-03-26 MED ORDER — HYDRALAZINE HCL 50 MG PO TABS
50.0000 mg | ORAL_TABLET | Freq: Three times a day (TID) | ORAL | Status: DC
  Administered 2022-03-26 – 2022-04-02 (×20): 50 mg via ORAL
  Filled 2022-03-26 (×21): qty 1

## 2022-03-26 MED ORDER — LABETALOL HCL 5 MG/ML IV SOLN
5.0000 mg | INTRAVENOUS | Status: DC | PRN
  Administered 2022-03-26: 20 mg via INTRAVENOUS
  Filled 2022-03-26: qty 4

## 2022-03-26 MED ORDER — HEPARIN SODIUM (PORCINE) 5000 UNIT/ML IJ SOLN
5000.0000 [IU] | Freq: Three times a day (TID) | INTRAMUSCULAR | Status: DC
  Administered 2022-03-26 – 2022-04-02 (×21): 5000 [IU] via SUBCUTANEOUS
  Filled 2022-03-26 (×22): qty 1

## 2022-03-26 MED ORDER — HYDRALAZINE HCL 20 MG/ML IJ SOLN
10.0000 mg | INTRAMUSCULAR | Status: DC | PRN
  Filled 2022-03-26: qty 1

## 2022-03-26 NOTE — Evaluation (Signed)
Occupational Therapy Evaluation ?Patient Details ?Name: [name] ?MRN: [id number] ?DOB: 10-Jun-1923 ?Today's Date: 03/26/2022 ? ? ?History of Present Illness Pt is 88 yo female who presents with R sided weakness, aphasia, and slurred speech. CT head showed L thalamic IPH.  PMH: DVT, HTN, hypothyoroidism, GERD, PVD, Afib.  ? ?Clinical Impression ?  ?[name] was evaluated s/p the above admission list, she is generally mod I at baseline with use of RW. She has an aide 6 hours/day for 4 days/week and supportive family near by who could work out 24/7 if needed at d/c. Upon arrival pt was sleeping and remained lethargic throughout. Pt followed <50% of simple commands and had receptive and expressive communication difficulties. Pt also noted with RUE sensory motor impairments. Due to limitations listed below pt is required max-total A +2 for all aspects of her care and mobility. OT to continue to follow acutely. Recommend AIR at d/c for maximal functional recovery.  ?   ? ?Recommendations for follow up therapy are one component of a multi-disciplinary discharge planning process, led by the attending physician.  Recommendations may be updated based on patient status, additional functional criteria and insurance authorization.  ? ?Follow Up Recommendations ? Acute inpatient rehab (3hours/day)  ?  ?Assistance Recommended at Discharge Frequent or constant Supervision/Assistance  ?Patient can return home with the following A lot of help with walking and/or transfers;A lot of help with bathing/dressing/bathroom;Assistance with cooking/housework;Assistance with feeding;Direct supervision/assist for medications management;Direct supervision/assist for financial management;Assist for transportation;Help with stairs or ramp for entrance ? ?  ?Functional Status Assessment ? Patient has had a recent decline in their functional status and demonstrates the ability to make significant improvements in function in a reasonable and  predictable amount of time.  ?Equipment Recommendations ?    ?  ?Recommendations for Other Services Rehab consult ? ? ?  ?Precautions / Restrictions Precautions ?Precautions: Fall ?Precaution Comments: weakness LLE from back surgery, cartilage damage R knee. Falling has been an issue past few yrs (hip fx, wrist fx) but she has not had any recent falls ?Restrictions ?Weight Bearing Restrictions: No  ? ?  ? ?Mobility Bed Mobility ?Overal bed mobility: Needs Assistance ?Bed Mobility: Supine to Sit, Sit to Supine ?  ?  ?Supine to sit: Max assist, +2 for physical assistance ?Sit to supine: Total assist, +2 for physical assistance ?  ?General bed mobility comments: pt not following commands consistently, needed facilitation to intiate mvmt and then seemed insecure and resistant to coming up to EOB necessitating the need for max A +2. Tot A +2 to return to supine though pt did initiate this transition ?  ? ?Transfers ?  ?  ?  ?  ?  ?  ?  ?  ?  ?General transfer comment: could not tolerate sitting long enough to attempt standing ?  ? ?  ?Balance Overall balance assessment: Needs assistance, History of Falls ?Sitting-balance support: Single extremity supported, Feet supported ?Sitting balance-Leahy Scale: Poor ?Sitting balance - Comments: R lean in sitting, pushing with L UE when allowed to place it on bed. Notable insecure when brought to midline. ?Postural control: Right lateral lean ?  ?  ?  ?  ?  ?  ?  ?  ?  ?  ?  ?  ?  ?  ?   ? ?ADL either performed or assessed with clinical judgement  ? ?ADL Overall ADL's : Needs assistance/impaired ?Eating/Feeding: NPO ?  ?  ?  ?  ?  ?  ?  ?  ?  ?  ?  ?  ?  ?  ?  ?  ?  ?  Functional mobility during ADLs: Total assistance;+2 for physical assistance;+2 for safety/equipment ?General ADL Comments: max-total A +2 for all ADLs this date. Pt extremely lethargic and had difficulty maintaing arousal, aphasic, R weakness with impaired sensory motor control  ? ? ? ?Vision Baseline Vision/History:  6 Macular Degeneration ?Patient Visual Report: No change from baseline ?Vision Assessment?: Vision impaired- to be further tested in functional context ?Additional Comments: pt has MD, son stated one eye is severly impacted  ?   ?Perception   ?  ?Praxis   ?  ? ?Pertinent Vitals/Pain Pain Assessment ?Pain Assessment: Faces ?Faces Pain Scale: Hurts a little bit ?Pain Location: R eye / head? ?Pain Descriptors / Indicators: Headache, Grimacing ?Pain Intervention(s): Limited activity within patient's tolerance  ? ? ? ?Hand Dominance Right ?  ?Extremity/Trunk Assessment Upper Extremity Assessment ?Upper Extremity Assessment: RUE deficits/detail;Generalized weakness ?RUE Deficits / Details: nearly full range with increased time and cues. ataxic movement. 3/5 grossly ?RUE Sensation: decreased light touch;decreased proprioception ?RUE Coordination: decreased fine motor;decreased gross motor ?  ?Lower Extremity Assessment ?Lower Extremity Assessment: Defer to PT evaluation ?RLE Deficits / Details: hip flex <3/5, RLE weaker than L ?RLE Coordination: decreased gross motor ?  ?Cervical / Trunk Assessment ?Cervical / Trunk Assessment: Kyphotic ?  ?Communication Communication ?Communication: Expressive difficulties;Receptive difficulties ?  ?Cognition Arousal/Alertness: Lethargic ?Behavior During Therapy: Flat affect ?Overall Cognitive Status: Difficult to assess ?Area of Impairment: Orientation, Attention, Memory, Following commands, Safety/judgement, Awareness, Problem solving ?  ?  ?  ?  ?  ?  ?  ?  ?Orientation Level: Situation, Place, Time ?Current Attention Level: Sustained ?  ?Following Commands: Follows one step commands inconsistently ?  ?  ?Problem Solving: Requires tactile cues, Decreased initiation, Slow processing, Difficulty sequencing ?General Comments: difficult to assess with lethargy and aphasia. Son reports she was more alert this AM and is intact at baseline ?  ?  ?General Comments  VSS on RA, son present ? ?   ?Exercises   ?  ?Shoulder Instructions    ? ? ?Home Living Family/patient expects to be discharged to:: Private residence ?Living Arrangements: Alone ?Available Help at Discharge: Personal care attendant;Family;Available PRN/intermittently ?Type of Home: House ?Home Access: Stairs to enter ?Entrance Stairs-Number of Steps: 2 ?Entrance Stairs-Rails: Right;Left;Can reach both ?Home Layout: One level ?  ?  ?Bathroom Shower/Tub: Walk-in shower ?  ?Bathroom Toilet: Handicapped height ?Bathroom Accessibility: Yes ?  ?Home Equipment: Rolling Walker (2 wheels);Grab bars - tub/shower;BSC/3in1;Cane - single point ?  ?Additional Comments: caregiver comes 4 days/ wk, 9-3p. Numerous family members live within 2 mi and are alerted whenever she pushed life alert button as she did this time ? Lives With: Alone ? ?  ?Prior Functioning/Environment Prior Level of Function : Needs assist ?  ?  ?  ?  ?  ?  ?Mobility Comments: was ambulating independently with RW ?ADLs Comments: caregiver present for bathing but not physically assisting ?  ? ?  ?  ?OT Problem List: Decreased strength;Decreased activity tolerance;Decreased range of motion;Impaired balance (sitting and/or standing);Decreased cognition;Decreased coordination;Decreased safety awareness;Decreased knowledge of use of DME or AE;Decreased knowledge of precautions;Impaired UE functional use ?  ?   ?OT Treatment/Interventions: Self-care/ADL training;Therapeutic exercise;Neuromuscular education;DME and/or AE instruction;Therapeutic activities;Patient/family education;Balance training  ?  ?OT Goals(Current goals can be found in the care plan section) Acute Rehab OT Goals ?Patient Stated Goal: unable to state ?OT Goal Formulation: With family ?Time For Goal Achievement: 04/09/22 ?Potential to Achieve Goals: Fair ?ADL Goals ?Pt Will Perform  Grooming: with min assist;sitting ?Pt Will Perform Upper Body Dressing: with min assist;sitting ?Pt Will Perform Lower Body Dressing: with mod  assist;sit to/from stand ?Pt Will Transfer to Toilet: with mod assist;ambulating ?Additional ADL Goal #1: Pt will follow 1 step directions 75% of the session to assist with ADLs  ?OT Frequency: Min 2X/week ?  ?

## 2022-03-26 NOTE — Progress Notes (Signed)
Pt continuing to have SBP >160 despite receiving ordered PRNs. Dr. Erlinda Hong notified and orders received.  ?

## 2022-03-26 NOTE — Progress Notes (Signed)
? ?  Inpatient Rehab Admissions Coordinator : ? ?Per therapy recommendations, patient was screened for CIR candidacy by Danne Baxter RN MSN.  At this time patient appears to be a potential candidate for CIR once tolerance improves.  I will place a rehab consult per protocol for full assessment. Please call me with any questions. ? ?Danne Baxter RN MSN ?Admissions Coordinator ?[phone] ?  ?

## 2022-03-26 NOTE — Progress Notes (Signed)
OT Cancellation Note ? ?Patient Details ?Name: [name] ?MRN: [id number] ?DOB: 10/01/1923 ? ? ?Cancelled Treatment:    Reason Eval/Treat Not Completed: Active bedrest order (OT evaluation to f/u as activity orders progress) ? ?[name] A Prabhnoor Ellenberger ?03/26/2022, 8:00 AM ?

## 2022-03-26 NOTE — Progress Notes (Signed)
PT Cancellation Note ? ?Patient Details ?Name: [name] ?MRN: [id number] ?DOB: 1923-02-28 ? ? ?Cancelled Treatment:    Reason Eval/Treat Not Completed: Active bedrest order. Will follow as activity orders progress.  ? ?Leighton Roach, PT  ?Acute Rehab Services ? Pager [phone] ?Office [phone] ? ? ? ?[place] ?03/26/2022, 8:32 AM ?

## 2022-03-26 NOTE — Progress Notes (Signed)
?  Transition of Care (TOC) Screening Note ? ? ?Patient Details  ?Name: [name] ?Date of Birth: May 30, 1923 ? ? ?Transition of Care [facility]) CM/SW Contact:    ?Ella Bodo, RN ?Phone Number: ?03/26/2022, 3:29 PM ? ? ? ?Transition of Care Department [facility]) has reviewed patient and no TOC needs have been identified at this time. We will continue to monitor patient advancement through interdisciplinary progression rounds. If new patient transition needs arise, please place a TOC consult. ? ?Noted PT recommending CIR, and consult to follow.   ? ?Reinaldo Raddle, RN, BSN  ?Trauma/Neuro ICU Case Manager ?[phone]  ?

## 2022-03-26 NOTE — Progress Notes (Addendum)
STROKE TEAM PROGRESS NOTE  ? ?INTERVAL HISTORY ?Patient is seen in her room with son at the bedside.   ?Bedrest d/c'd, awaiting PT/OT eval. PO bp meds initiated and cleviprex discontinued. Plan to transfer out of ICU today. Aphasia is improving, fluctuates when she is tired. Right hand movements slowly improving as well.  ? ?Vitals:  ? 03/26/22 0900 03/26/22 1000 03/26/22 1005 03/26/22 1100  ?BP: (!) 154/84 (!) 162/142 (!) 153/77 (!) 179/81  ?Pulse: 67 68 67 68  ?Resp: 19 (!) 27 (!) 22 20  ?Temp:      ?TempSrc:      ?SpO2: 97% 97% 98% 98%  ?Weight:      ?Height:      ? ?CBC:  ?Recent Labs  ?Lab 03/25/22 ?0250 03/25/22 ?0255 03/26/22 ?0433  ?WBC 5.3  --  10.3  ?NEUTROABS 2.8  --   --   ?HGB 9.4* 11.9* 11.4*  ?HCT 28.7* 35.0* 36.6  ?MCV 91.1  --  90.8  ?PLT 208  --  272  ? ? ?Basic Metabolic Panel:  ?Recent Labs  ?Lab 03/25/22 ?0250 03/25/22 ?0255 03/26/22 ?0433  ?NA 143 144 139  ?K 3.8 3.8 3.6  ?CL 110 108 107  ?CO2 25  --  26  ?GLUCOSE 116* 117* 102*  ?BUN '19 20 11  '$ ?CREATININE 0.87 0.90 0.77  ?CALCIUM 9.2  --  8.9  ? ? ?Lipid Panel:  ?Recent Labs  ?Lab 03/26/22 ?0433  ?CHOL 177  ?TRIG 150*  ?HDL 53  ?CHOLHDL 3.3  ?VLDL 30  ?[place] 94  ? ?HgbA1c:  ?Recent Labs  ?Lab 03/26/22 ?0433  ?HGBA1C 5.9*  ? ?Urine Drug Screen: No results for input(s): LABOPIA, COCAINSCRNUR, LABBENZ, AMPHETMU, THCU, LABBARB in the last 168 hours.  ?Alcohol Level No results for input(s): ETH in the last 168 hours. ? ?IMAGING past 24 hours ?CT HEAD WO CONTRAST (5MM) ? ?Result Date: 03/25/2022 ?CLINICAL DATA:  Hemorrhagic stroke EXAM: CT HEAD WITHOUT CONTRAST TECHNIQUE: Contiguous axial images were obtained from the base of the skull through the vertex without intravenous contrast. RADIATION DOSE REDUCTION: This exam was performed according to the departmental dose-optimization program which includes automated exposure control, adjustment of the mA and/or kV according to patient size and/or use of iterative reconstruction technique. COMPARISON:   03/25/2022 at 8 o'clock a.m. FINDINGS: Brain: Unchanged area of intraparenchymal hemorrhage in the left thalamus. Small amount of intraventricular blood is also unchanged. No midline shift or other mass effect. Mild edema surrounding the hemorrhage site. There is periventricular hypoattenuation compatible with chronic microvascular disease. Vascular: No abnormal hyperdensity of the major intracranial arteries or dural venous sinuses. No intracranial atherosclerosis. Skull: The visualized skull base, calvarium and extracranial soft tissues are normal. Sinuses/Orbits: No fluid levels or advanced mucosal thickening of the visualized paranasal sinuses. No mastoid or middle ear effusion. The orbits are normal. IMPRESSION: Unchanged area of intraparenchymal hemorrhage in the left thalamus with small amount of intraventricular blood. Electronically Signed   By: Ulyses Jarred M.D.   On: 03/25/2022 21:43  ? ?ECHOCARDIOGRAM COMPLETE ? ?Result Date: 03/25/2022 ?   ECHOCARDIOGRAM REPORT   Patient Name:   [name] Date of Exam: 03/25/2022 Medical Rec #:  [id number]       Height:       66.0 in Accession #:    [id number]      Weight:       145.5 lb Date of Birth:  1922-12-26        BSA:  1.747 m? Patient Age:    88 years        BP:           146/87 mmHg Patient Gender: F               HR:           75 bpm. Exam Location:  Inpatient Procedure: 2D Echo, Cardiac Doppler and Color Doppler Indications:    Stroke  History:        Patient has prior history of Echocardiogram examinations, most                 recent 05/12/2020. Arrythmias:Atrial Fibrillation.  Sonographer:    Joette Catching RCS Referring Phys: [id number] [place]  1. Left ventricular ejection fraction, by estimation, is 55 to 60%. The left ventricle has normal function. The left ventricle has no regional wall motion abnormalities. There is mild concentric left ventricular hypertrophy. Left ventricular diastolic parameters are indeterminate.  Elevated left ventricular end-diastolic pressure.  2. Right ventricular systolic function is normal. The right ventricular size is normal.  3. Left atrial size was mildly dilated.  4. The mitral valve is normal in structure. No evidence of mitral valve regurgitation. No evidence of mitral stenosis.  5. The aortic valve is normal in structure. Aortic valve regurgitation is trivial. Aortic valve sclerosis is present, with no evidence of aortic valve stenosis. Aortic regurgitation PHT measures 519 msec.  6. There is dilatation of the ascending aorta, measuring 39 mm.  7. The inferior vena cava is normal in size with greater than 50% respiratory variability, suggesting right atrial pressure of 3 mmHg. FINDINGS  Left Ventricle: Left ventricular ejection fraction, by estimation, is 55 to 60%. The left ventricle has normal function. The left ventricle has no regional wall motion abnormalities. The left ventricular internal cavity size was normal in size. There is  mild concentric left ventricular hypertrophy. Left ventricular diastolic parameters are indeterminate. Elevated left ventricular end-diastolic pressure. Right Ventricle: The right ventricular size is normal. No increase in right ventricular wall thickness. Right ventricular systolic function is normal. Left Atrium: Left atrial size was mildly dilated. Right Atrium: Right atrial size was normal in size. Pericardium: There is no evidence of pericardial effusion. Mitral Valve: The mitral valve is normal in structure. Mild mitral annular calcification. No evidence of mitral valve regurgitation. No evidence of mitral valve stenosis. Tricuspid Valve: The tricuspid valve is normal in structure. Tricuspid valve regurgitation is not demonstrated. No evidence of tricuspid stenosis. Aortic Valve: The aortic valve is normal in structure. Aortic valve regurgitation is trivial. Aortic regurgitation PHT measures 519 msec. Aortic valve sclerosis is present, with no evidence of  aortic valve stenosis. Aortic valve mean gradient measures 8.0 mmHg. Aortic valve peak gradient measures 14.0 mmHg. Aortic valve area, by VTI measures 3.05 cm?. Pulmonic Valve: The pulmonic valve was not well visualized. Pulmonic valve regurgitation is trivial. No evidence of pulmonic stenosis. Aorta: The aortic root is normal in size and structure. There is dilatation of the ascending aorta, measuring 39 mm. Venous: The inferior vena cava is normal in size with greater than 50% respiratory variability, suggesting right atrial pressure of 3 mmHg. IAS/Shunts: No atrial level shunt detected by color flow Doppler.  LEFT VENTRICLE PLAX 2D LVIDd:         5.40 cm   Diastology LVIDs:         3.40 cm   LV e' medial:    4.14 cm/s LV PW:  0.90 cm   LV E/e' medial:  24.4 LV IVS:        1.20 cm   LV e' lateral:   4.30 cm/s LVOT diam:     2.20 cm   LV E/e' lateral: 23.5 LV SV:         105 LV SV Index:   60 LVOT Area:     3.80 cm?  RIGHT VENTRICLE             IVC RV Basal diam:  3.00 cm     IVC diam: 1.60 cm RV Mid diam:    2.80 cm RV S prime:     18.60 cm/s TAPSE (M-mode): 2.6 cm LEFT ATRIUM             Index        RIGHT ATRIUM           Index LA diam:        3.00 cm 1.72 cm/m?   RA Area:     13.90 cm? LA Vol (A2C):   62.0 ml 35.49 ml/m?  RA Volume:   27.50 ml  15.74 ml/m? LA Vol (A4C):   52.7 ml 30.17 ml/m? LA Biplane Vol: 62.3 ml 35.66 ml/m?  AORTIC VALVE                     PULMONIC VALVE AV Area (Vmax):    2.85 cm?      PV Vmax:          0.90 m/s AV Area (Vmean):   2.69 cm?      PV Peak grad:     3.2 mmHg AV Area (VTI):     3.05 cm?      PR End Diast Vel: 9.73 msec AV Vmax:           187.00 cm/s AV Vmean:          132.000 cm/s AV VTI:            0.343 m AV Peak Grad:      14.0 mmHg AV Mean Grad:      8.0 mmHg LVOT Vmax:         140.00 cm/s LVOT Vmean:        93.500 cm/s LVOT VTI:          0.275 m LVOT/AV VTI ratio: 0.80 AI PHT:            519 msec  AORTA Ao Root diam: 3.10 cm Ao Asc diam:  3.90 cm MITRAL VALVE                 TRICUSPID VALVE MV Area (PHT): 3.65 cm?     TR Peak grad:   19.5 mmHg MV Decel Time: 208 msec     TR Vmax:        221.00 cm/s MV E velocity: 101.00 cm/s MV A velocity: 120.00 cm/s  SHUNTS MV E/A r

## 2022-03-26 NOTE — Evaluation (Signed)
Physical Therapy Evaluation ?Patient Details ?Name: [name] ?MRN: [id number] ?DOB: 01-17-1923 ?Today's Date: 03/26/2022 ? ?History of Present Illness ? Pt is 88 yo female who presents with R sided weakness, aphasia, and slurred speech. CT head showed L thalamic IPH.  PMH: DVT, HTN, hypothyoroidism, GERD, PVD, Afib.  ?Clinical Impression ? Pt admitted with above diagnosis. Pt is from home where she was at supervision level with mobility and had a caregiver 4 days/wk 9-3p. Pt very lethargic on eval and with receptive and expressive deficits complicating evaluation. Pt required max A +2 to come to EOB and then had heavy R lean with notable insecurity and fear of falling when brought to midline. Pt tolerated sitting only a few minutes before expressing discomfort and the desire to return to bed. Tot A +2 needed to return to bed. Pt c/o head pain. RN notified. Son reports that pt was more alert and responsive this AM. Recommend AIR to return to PLOF.  Pt currently with functional limitations due to the deficits listed below (see PT Problem List). Pt will benefit from skilled PT to increase their independence and safety with mobility to allow discharge to the venue listed below.   ?   ?   ? ?Recommendations for follow up therapy are one component of a multi-disciplinary discharge planning process, led by the attending physician.  Recommendations may be updated based on patient status, additional functional criteria and insurance authorization. ? ?Follow Up Recommendations Acute inpatient rehab (3hours/day) ? ?  ?Assistance Recommended at Discharge Frequent or constant Supervision/Assistance  ?Patient can return home with the following ? Two people to help with walking and/or transfers;Two people to help with bathing/dressing/bathroom;Assistance with cooking/housework;Assistance with feeding;Direct supervision/assist for medications management;Direct supervision/assist for financial management;Assist for  transportation;Help with stairs or ramp for entrance ? ?  ?Equipment Recommendations Other (comment) (TBD)  ?Recommendations for Other Services ? Rehab consult  ?  ?Functional Status Assessment Patient has had a recent decline in their functional status and demonstrates the ability to make significant improvements in function in a reasonable and predictable amount of time.  ? ?  ?Precautions / Restrictions Precautions ?Precautions: Fall ?Precaution Comments: weakness LLE from back surgery, cartilage damage R knee. Falling has been an issue past few yrs (hip fx, wrist fx) but she has not had any recent falls ?Restrictions ?Weight Bearing Restrictions: No  ? ?  ? ?Mobility ? Bed Mobility ?Overal bed mobility: Needs Assistance ?Bed Mobility: Supine to Sit, Sit to Supine ?  ?  ?Supine to sit: Max assist, +2 for physical assistance ?Sit to supine: Total assist, +2 for physical assistance ?  ?General bed mobility comments: pt not following commands consistently, needed facilitation to intiate mvmt and then seemed insecure and resistant to coming up to EOB necessitating the need for max A +2. Tot A +2 to return to supine though pt did initiate this transition ?  ? ?Transfers ?  ?  ?  ?  ?  ?  ?  ?  ?  ?General transfer comment: could not tolerate sitting long enough to attempt standing ?  ? ?Ambulation/Gait ?  ?  ?  ?  ?  ?  ?  ?General Gait Details: unable ? ?Stairs ?  ?  ?  ?  ?  ? ?Wheelchair Mobility ?  ? ?Modified Rankin (Stroke Patients Only) ?Modified Rankin (Stroke Patients Only) ?Pre-Morbid Rankin Score: No symptoms ?Modified Rankin: Severe disability ? ?  ? ?Balance Overall balance assessment: Needs assistance, History  of Falls ?Sitting-balance support: Single extremity supported, Feet supported ?Sitting balance-Leahy Scale: Poor ?Sitting balance - Comments: R lean in sitting, pushing with L UE when allowed to place it on bed. Notable insecure when brought to midline. ?Postural control: Right lateral lean ?  ?  ?   ?  ?  ?  ?  ?  ?  ?  ?  ?  ?  ?  ?   ? ? ? ?Pertinent Vitals/Pain Pain Assessment ?Pain Assessment: Faces ?Faces Pain Scale: Hurts even more ?Pain Location: head ?Pain Descriptors / Indicators: Headache, Grimacing ?Pain Intervention(s): Limited activity within patient's tolerance, Monitored during session, Patient requesting pain meds-RN notified  ? ? ?Home Living Family/patient expects to be discharged to:: Private residence ?Living Arrangements: Alone ?Available Help at Discharge: Personal care attendant;Family;Available PRN/intermittently ?Type of Home: House ?Home Access: Stairs to enter ?Entrance Stairs-Rails: Right;Left;Can reach both ?Entrance Stairs-Number of Steps: 2 ?  ?Home Layout: One level ?Home Equipment: Rolling Walker (2 wheels);Grab bars - tub/shower;BSC/3in1;Cane - single point ?Additional Comments: caregiver comes 4 days/ wk, 9-3p. Numerous family members live within 2 mi and are alerted whenever she pushed life alert button as she did this time  ?  ?Prior Function Prior Level of Function : Needs assist ?  ?  ?  ?  ?  ?  ?Mobility Comments: was ambulating independently with RW ?ADLs Comments: caregiver present for bathing but not physically assisting ?  ? ? ?Hand Dominance  ? Dominant Hand: Right ? ?  ?Extremity/Trunk Assessment  ? Upper Extremity Assessment ?Upper Extremity Assessment: RUE deficits/detail;Generalized weakness ?  ? ?Lower Extremity Assessment ?Lower Extremity Assessment: RLE deficits/detail ?RLE Deficits / Details: hip flex <3/5, RLE weaker than L ?RLE Coordination: decreased gross motor ?  ? ?Cervical / Trunk Assessment ?Cervical / Trunk Assessment: Kyphotic  ?Communication  ? Communication: Expressive difficulties;Receptive difficulties  ?Cognition Arousal/Alertness: Lethargic, Suspect due to medications ?Behavior During Therapy: Flat affect ?Overall Cognitive Status: Impaired/Different from baseline ?Area of Impairment: Orientation, Attention, Memory, Following commands,  Safety/judgement, Awareness, Problem solving ?  ?  ?  ?  ?  ?  ?  ?  ?Orientation Level: Situation, Place, Time ?Current Attention Level: Sustained ?  ?Following Commands: Follows one step commands inconsistently ?  ?  ?Problem Solving: Requires tactile cues, Decreased initiation, Slow processing, Difficulty sequencing ?General Comments: difficult to assess with lethargy and aphasia. Son reports she was more alert this AM and is intact at baseline ?  ?  ? ?  ?General Comments General comments (skin integrity, edema, etc.): BP stable in supine and sitting. Son present for session ? ?  ?Exercises    ? ?Assessment/Plan  ?  ?PT Assessment Patient needs continued PT services  ?PT Problem List Decreased strength;Decreased range of motion;Decreased activity tolerance;Decreased balance;Decreased mobility;Decreased coordination;Decreased cognition;Decreased knowledge of use of DME;Decreased safety awareness;Decreased knowledge of precautions;Pain ? ?   ?  ?PT Treatment Interventions DME instruction;Gait training;Functional mobility training;Therapeutic activities;Therapeutic exercise;Balance training;Neuromuscular re-education;Cognitive remediation;Patient/family education   ? ?PT Goals (Current goals can be found in the Care Plan section)  ?Acute Rehab PT Goals ?Patient Stated Goal: none stated ?PT Goal Formulation: With patient/family ?Time For Goal Achievement: 04/09/22 ?Potential to Achieve Goals: Fair ? ?  ?Frequency Min 4X/week ?  ? ? ?Co-evaluation PT/OT/SLP Co-Evaluation/Treatment: Yes ?Reason for Co-Treatment: Complexity of the patient's impairments (multi-system involvement);For patient/therapist safety;To address functional/ADL transfers ?PT goals addressed during session: Mobility/safety with mobility;Balance ?OT goals addressed during session: ADL's and self-care ?  ? ? ?  ?  AM-PAC PT "6 Clicks" Mobility  ?Outcome Measure Help needed turning from your back to your side while in a flat bed without using bedrails?:  A Lot ?Help needed moving from lying on your back to sitting on the side of a flat bed without using bedrails?: A Lot ?Help needed moving to and from a bed to a chair (including a wheelchair)?: Total ?Help needed stan

## 2022-03-27 ENCOUNTER — Inpatient Hospital Stay (HOSPITAL_COMMUNITY): Payer: Medicare PPO

## 2022-03-27 DIAGNOSIS — I480 Paroxysmal atrial fibrillation: Secondary | ICD-10-CM

## 2022-03-27 DIAGNOSIS — I615 Nontraumatic intracerebral hemorrhage, intraventricular: Secondary | ICD-10-CM | POA: Diagnosis not present

## 2022-03-27 DIAGNOSIS — I610 Nontraumatic intracerebral hemorrhage in hemisphere, subcortical: Secondary | ICD-10-CM | POA: Diagnosis not present

## 2022-03-27 DIAGNOSIS — I161 Hypertensive emergency: Secondary | ICD-10-CM | POA: Diagnosis not present

## 2022-03-27 LAB — BASIC METABOLIC PANEL
Anion gap: 6 (ref 5–15)
BUN: 13 mg/dL (ref 8–23)
CO2: 24 mmol/L (ref 22–32)
Calcium: 9.2 mg/dL (ref 8.9–10.3)
Chloride: 110 mmol/L (ref 98–111)
Creatinine, Ser: 0.8 mg/dL (ref 0.44–1.00)
GFR, Estimated: >60 mL/min (ref >60)
Glucose, Bld: 107 mg/dL — ABNORMAL HIGH (ref 70–99)
Potassium: 4.3 mmol/L (ref 3.5–5.1)
Sodium: 140 mmol/L (ref 135–145)

## 2022-03-27 LAB — CBC
HCT: 36.1 % (ref 36.0–46.0)
Hemoglobin: 11.6 g/dL — ABNORMAL LOW (ref 12.0–15.0)
MCH: 29.1 pg (ref 26.0–34.0)
MCHC: 32.1 g/dL (ref 30.0–36.0)
MCV: 90.5 fL (ref 80.0–100.0)
Platelets: 264 10*3/uL (ref 150–400)
RBC: 3.99 MIL/uL (ref 3.87–5.11)
RDW: 14.6 % (ref 11.5–15.5)
WBC: 9.8 10*3/uL (ref 4.0–10.5)
nRBC: 0 % (ref 0.0–0.2)

## 2022-03-27 MED ORDER — ATORVASTATIN CALCIUM 10 MG PO TABS
20.0000 mg | ORAL_TABLET | Freq: Every day | ORAL | Status: DC
  Administered 2022-03-28 – 2022-04-02 (×6): 20 mg via ORAL
  Filled 2022-03-27 (×6): qty 2

## 2022-03-27 NOTE — Progress Notes (Signed)
Physical Therapy Treatment ?Patient Details ?Name: [name] ?MRN: [id number] ?DOB: 09-12-1923 ?Today's Date: 03/27/2022 ? ? ?History of Present Illness Pt is 88 yo female who presents with R sided weakness, aphasia, and slurred speech. CT head showed L thalamic IPH.  PMH: DVT, HTN, hypothyoroidism, GERD, PVD, Afib. ? ?  ?PT Comments  ? ? Pt tolerates treatment well, progressing to transfer training. Pt continues to demonstrate R sided lean in sitting as well as in standing, intermittent pushing to right with LUE. Pt remains weak R side and is at a high risk for falls due to weakness, imbalance and impaired awareness. Pt will benefit from continued frequent mobilization in an effort to reduce falls risk and caregiver burden.  ?Recommendations for follow up therapy are one component of a multi-disciplinary discharge planning process, led by the attending physician.  Recommendations may be updated based on patient status, additional functional criteria and insurance authorization. ? ?Follow Up Recommendations ? Acute inpatient rehab (3hours/day) ?  ?  ?Assistance Recommended at Discharge Frequent or constant Supervision/Assistance  ?Patient can return home with the following Two people to help with walking and/or transfers;Two people to help with bathing/dressing/bathroom;Assistance with cooking/housework;Assistance with feeding;Direct supervision/assist for medications management;Direct supervision/assist for financial management;Assist for transportation;Help with stairs or ramp for entrance ?  ?Equipment Recommendations ? Wheelchair (measurements PT);Wheelchair cushion (measurements PT) (hoyer lift)  ?  ?Recommendations for Other Services   ? ? ?  ?Precautions / Restrictions Precautions ?Precautions: Fall ?Precaution Comments: weakness LLE from back surgery, cartilage damage R knee. Falling has been an issue past few yrs (hip fx, wrist fx) but she has not had any recent falls ?Restrictions ?Weight Bearing  Restrictions: No  ?  ? ?Mobility ? Bed Mobility ?Overal bed mobility: Needs Assistance ?Bed Mobility: Rolling, Sidelying to Sit, Sit to Supine ?Rolling: Mod assist ?Sidelying to sit: Max assist ?Supine to sit: Max assist ?  ?  ?General bed mobility comments: verbal and tactile cues for sequencing of roll. requires physical assist to rotate trunk. Pt is able to initiate sidelying to sit with LUE push ?  ? ?Transfers ?Overall transfer level: Needs assistance ?Equipment used: 1 person hand held assist ?Transfers: Bed to chair/wheelchair/BSC ?  ?  ?  ?Squat pivot transfers: Max assist ?  ?  ?General transfer comment: pt with R lateral lean pre-transfer and during transfer leading to difficulty pivoting to left side. PT assists pt in transfer to recliner and later back to bed ?  ? ?Ambulation/Gait ?  ?  ?  ?  ?  ?  ?  ?  ? ? ?Stairs ?  ?  ?  ?  ?  ? ? ?Wheelchair Mobility ?  ? ?Modified Rankin (Stroke Patients Only) ?Modified Rankin (Stroke Patients Only) ?Pre-Morbid Rankin Score: No symptoms ?Modified Rankin: Severe disability ? ? ?  ?Balance Overall balance assessment: Needs assistance ?Sitting-balance support: Single extremity supported, Feet supported ?Sitting balance-Leahy Scale: Poor ?Sitting balance - Comments: R lateral lean in sitting, pushing to R intermittently with LUE ?Postural control: Right lateral lean ?Standing balance support: Single extremity supported ?Standing balance-Leahy Scale: Zero ?Standing balance comment: max-totalA ?  ?  ?  ?  ?  ?  ?  ?  ?  ?  ?  ?  ? ?  ?Cognition Arousal/Alertness: Awake/alert (brief periods of lethargy) ?Behavior During Therapy: Flat affect ?Overall Cognitive Status: Difficult to assess ?Area of Impairment: Attention, Following commands, Safety/judgement, Awareness, Problem solving ?  ?  ?  ?  ?  ?  ?  ?  ?  ?  Current Attention Level: Sustained ?  ?Following Commands: Follows one step commands with increased time ?Safety/Judgement: Decreased awareness of safety, Decreased  awareness of deficits ?Awareness: Intellectual ?Problem Solving: Slow processing, Requires verbal cues, Difficulty sequencing, Requires tactile cues ?General Comments: difficult to assess 2/2 aphasia ?  ?  ? ?  ?Exercises   ? ?  ?General Comments General comments (skin integrity, edema, etc.): VSS on RA, BP 116/64 sitting in recliner. Pt with poor ability to verbalize symptoms ?  ?  ? ?Pertinent Vitals/Pain Pain Assessment ?Pain Assessment: Faces ?Faces Pain Scale: Hurts even more ?Pain Location: back ?Pain Descriptors / Indicators: Grimacing ?Pain Intervention(s): Monitored during session  ? ? ?Home Living   ?  ?  ?  ?  ?  ?  ?  ?  ?  ?   ?  ?Prior Function    ?  ?  ?   ? ?PT Goals (current goals can now be found in the care plan section) Acute Rehab PT Goals ?Patient Stated Goal: none stated ?Progress towards PT goals: Progressing toward goals ? ?  ?Frequency ? ? ? Min 4X/week ? ? ? ?  ?PT Plan Current plan remains appropriate  ? ? ?Co-evaluation   ?  ?  ?  ?  ? ?  ?AM-PAC PT "6 Clicks" Mobility   ?Outcome Measure ? Help needed turning from your back to your side while in a flat bed without using bedrails?: A Lot ?Help needed moving from lying on your back to sitting on the side of a flat bed without using bedrails?: A Lot ?Help needed moving to and from a bed to a chair (including a wheelchair)?: A Lot ?Help needed standing up from a chair using your arms (e.g., wheelchair or bedside chair)?: Total ?Help needed to walk in hospital room?: Total ?Help needed climbing 3-5 steps with a railing? : Total ?6 Click Score: 9 ? ?  ?End of Session   ?Activity Tolerance: Patient tolerated treatment well ?Patient left: in bed;with call bell/phone within reach;with bed alarm set;with family/visitor present ?Nurse Communication: Mobility status;Need for lift equipment ?PT Visit Diagnosis: Hemiplegia and hemiparesis;Pain;Difficulty in walking, not elsewhere classified (R26.2) ?Hemiplegia - Right/Left: Right ?Hemiplegia -  dominant/non-dominant: Dominant ?Hemiplegia - caused by: Nontraumatic SAH ?Pain - part of body:  (back) ?  ? ? ?Time: 4270-6237 ?PT Time Calculation (min) (ACUTE ONLY): 23 min ? ?Charges:  $Therapeutic Activity: 23-37 mins          ?          ? ?Zenaida Niece, PT, DPT ?Acute Rehabilitation ?Pager: [phone] ?Office [phone] ? ? ? ?Zenaida Niece ?03/27/2022, 5:36 PM ? ?

## 2022-03-27 NOTE — Progress Notes (Signed)
Inpatient Rehab Admissions Coordinator:  ? ?Met with patient at bedside to discuss CIR recommendations and goals/expectations of CIR stay.  She was asleep on my arrival but does wake fairly easily to voice, with fluctuating alertness throughout my interaction.  She appears to be able to answer yes/no questions with reasonable accuracy though does display some expressive aphasia with anything more complex than yes/no.  Will need to speak with family regarding caregiver support and would like to see how pt does with therapy today to gauge progress and estimate goals/LOS.  Will follow.  ? ?Shann Medal, PT, DPT ?Admissions Coordinator ?[phone] ?03/27/22  ?11:27 AM ?  ?

## 2022-03-27 NOTE — Progress Notes (Addendum)
STROKE TEAM PROGRESS NOTE  ? ?INTERVAL HISTORY ?Patient is seen in her room with no family at the bedside.  She was more lethargic this morning but still arouseable to loud voice and tough.  Head CT shows no acute changes, and patient has since become more alert.  She has remained hemodynamically stable. ? ?Vitals:  ? 03/27/22 0900 03/27/22 1000 03/27/22 1100 03/27/22 1200  ?BP: (!) 155/64 (!) 143/89 (!) 147/61 109/62  ?Pulse: 68 70 66 65  ?Resp: '18 20 17 15  '$ ?Temp:    98.6 ?F (37 ?C)  ?TempSrc:    Axillary  ?SpO2: 94% 94% 95% 96%  ?Weight:      ?Height:      ? ?CBC:  ?Recent Labs  ?Lab 03/25/22 ?0250 03/25/22 ?2947 03/26/22 ?6546 03/27/22 ?0239  ?WBC 5.3  --  10.3 9.8  ?NEUTROABS 2.8  --   --   --   ?HGB 9.4*   < > 11.4* 11.6*  ?HCT 28.7*   < > 36.6 36.1  ?MCV 91.1  --  90.8 90.5  ?PLT 208  --  272 264  ? < > = values in this interval not displayed.  ? ? ?Basic Metabolic Panel:  ?Recent Labs  ?Lab 03/26/22 ?0433 03/27/22 ?0239  ?NA 139 140  ?K 3.6 4.3  ?CL 107 110  ?CO2 26 24  ?GLUCOSE 102* 107*  ?BUN 11 13  ?CREATININE 0.77 0.80  ?CALCIUM 8.9 9.2  ? ? ?Lipid Panel:  ?Recent Labs  ?Lab 03/26/22 ?0433  ?CHOL 177  ?TRIG 150*  ?HDL 53  ?CHOLHDL 3.3  ?VLDL 30  ?Christine 94  ? ? ?HgbA1c:  ?Recent Labs  ?Lab 03/26/22 ?0433  ?HGBA1C 5.9*  ? ? ?Urine Drug Screen: No results for input(s): LABOPIA, COCAINSCRNUR, LABBENZ, AMPHETMU, THCU, LABBARB in the last 168 hours.  ?Alcohol Level No results for input(s): ETH in the last 168 hours. ? ?IMAGING past 24 hours ?CT HEAD WO CONTRAST (5MM) ? ?Result Date: 03/27/2022 ?CLINICAL DATA:  Stroke, hemorrhagic EXAM: CT HEAD WITHOUT CONTRAST TECHNIQUE: Contiguous axial images were obtained from the base of the skull through the vertex without intravenous contrast. RADIATION DOSE REDUCTION: This exam was performed according to the departmental dose-optimization program which includes automated exposure control, adjustment of the mA and/or kV according to patient size and/or use of iterative  reconstruction technique. COMPARISON:  03/25/2022 FINDINGS: Brain: Persistent but slightly decreased size of parenchymal hemorrhage centered in the left thalamus. Similar surrounding edema and mild mass effect. Intraventricular extension again noted with similar layering blood products in the occipital horns. No hydrocephalus. No new hemorrhage. Stable findings of parenchymal volume loss and probable chronic microvascular ischemic changes in the cerebral white matter. Vascular: There is atherosclerotic calcification at the skull base. Skull: Calvarium is unremarkable. Sinuses/Orbits: No acute finding. Other: None. IMPRESSION: Similar evolving left thalamic hemorrhage with intraventricular extension. No new hemorrhage or worsening mass effect. Electronically Signed   By: Macy Mis M.D.   On: 03/27/2022 10:34   ? ?PHYSICAL EXAM ?General:  Alert, well-nourished, well-developed elderly patient in no acute distress ?Respiratory:  Regular, unlabored respirations on room air ? ?NEURO:  ?Mental Status: AA&Ox1 ?Speech/Language: speech is nonfluent with mild dysarthria and aphasia.   ? ?Cranial Nerves:  ?II: PERRL.  ?III, IV, VI: EOMI. Eyelids elevate symmetrically.  ?V: Sensation is intact to light touch and symmetrical to face.  ?VII: Right sided facial droop present  ?VIII: hearing intact to voice. ?IX, X: Phonation is normal.  ?XII:  tongue is midline without fasciculations. ?Motor: 5/5 strength to LUE and LLE, 4/5 to RUE and RLE.  ?Tone: is normal and bulk is normal ?Sensation- Intact to light touch bilaterally.  ?Coordination: slowed RAM in right hand.  ?Gait- deferred ? ? ?ASSESSMENT/PLAN ?Ms. JOANMARIE TSANG is a 88 y.o. female with history of DVT, HTN, hypothyroidism, GERD, PVD and atrial fibrillation on Eliquis presenting with acute onset right sided weakness, aphasia and slurred speech.  She was found to have a left thalamic ICH (Score 2).  Her Eliquis was reversed with Andexxa, and she was placed on initially  placed on Cleviprex for BP control.  She has passed her swallow study, PO antihypertensives started, Cleviprex off. Awaiting PT/OT eval. ST recommending CIR. ? ?ICH score 2 ? ?ICH:  left thalamic ICH in setting of Eliquis use and hypertension ?Code Stroke CT head left thalamic ICH measuring 6 mL with trace IVH, atrophy and small vessel disease ?MRI  Evolving left thalamic ICH with small IVH ?MRA  no significant vascular abnormlaity ?CT 03/27/22 stable hematoma and small IVH ?Carotid Doppler unremarkable ?2D Echo EF 55 to 60% ?LDL 86 ?HgbA1c 5.9 ?VTE prophylaxis - heparin subq ?Eliquis (apixaban) daily prior to admission, now on No antithrombotic secondary to IPH ?Therapy recommendations:  CIR ?Disposition:  pending ? ?Hypertension ?Home meds:  losartan 100 mg daily, Cardizem, nadolol ?off Cleviprex ?Resumed home meds  ?Add hydralazine '50mg'$  tid ?PRN labetalol and hydralazine ?BP goal < 160 ?Long-term BP goal normotensive ? ?Hyperlipidemia ?Home meds:  none ?LDL 86, goal < 70 ?Consider low dose statin at discharge  ? ?Atrial fibrillation ?Patient has history of atrial fibrillation ?Was taking Eliquis at home ?Eliquis reversed with Andexxa ?Consider resumption of anticoagulation vs. ASPIRE trial after IPH resolves ? ?Dysphagia ?Moderate to severe dysarthria ?Passed swallow, on dysphagia 3 diet ?Speech on board ? ?Other Stroke Risk Factors ?Advanced Age >/= 73  ?PVD ?History of DVT ? ?Other Active Problems ?none ? ?Hospital day # 2 ? ?Patient seen and examined by NP/APP with MD. MD to update note as needed.  ? ?Sidney , MSN, AGACNP-BC ?[facility] ?See Amion for schedule and pager information ?03/27/2022 1:50 PM ? ?ATTENDING NOTE: ?I reviewed above note and agree with the assessment and plan. Pt was seen and examined.  ? ?No family at bedside this morning.  Patient lying in bed, more lethargic than yesterday.  Answer questions with "I do not know".  However, able to follow most simple commands  but psychomotor slowing.  Able to have bilateral gaze, blinking to visual threat more on the left than the right, still have mild right facial droop, right upper extremity 2+/5, right lower extremity 3 -/5.  Left l upper and lower extremity 3+/5.  Repeat CT showed stable hematoma and small IVH.  Bladder scan showed urinary retention 380 cc, will do in and out.  BP stable at the goal.  Vitals and labs stable.  Patient not good appetite, did not eat much breakfast today.  We will continue supportive care and monitoring. ? ?For detailed assessment and plan, please refer to above as I have made changes wherever appropriate.  ? ?Rosalin Hawking, MD PhD ?Stroke Neurology ?03/27/2022 ?4:38 PM ? ?This patient is critically ill due to Morrison and IVH, PAF not on AC right now, hypertensive emergency and at significant risk of neurological worsening, death form hematoma expansion, hydrocephalus, heart failure, hypertensive encephalopathy, seizure. This patient's care requires constant monitoring of vital signs, hemodynamics, respiratory  and cardiac monitoring, review of multiple databases, neurological assessment, discussion with family, other specialists and medical decision making of high complexity. I spent 40 minutes of neurocritical care time in the care of this patient. ? ? ? ? ?To contact Stroke Continuity provider, please refer to [email or web address] ?After hours, contact General Neurology  ?

## 2022-03-27 NOTE — Progress Notes (Signed)
?PROGRESS NOTE ? ?[name]  PIR:[id number] DOB: [birth year] DOA: 03/25/2022 ?PCP: [name], MD  ? ?Brief Narrative: ? ?Patient is a 88 year old female with history of DVT, hypertension, hypothyroidism, GERD, peripheral vascular disease, A-fib on Eliquis who presented with acute onset of right-sided weakness, aphasia and slurred speech.  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  After admission, her Eliquis was reversed with Andexxa.  Patient was also placed on Cleviprex for blood pressure control.  She was on neurology service, transferred to [facility] service on 5/17.  PT/OT evaluation done and recommended CIR.  Waiting for placement. ? ? ?Assessment & Plan: ? ?Principal Problem: ?  ICH (intracerebral hemorrhage) ([place]) ?Active Problems: ?  Hypertensive emergency ? ? ?Intracerebral hemorrhage: Presented with acute onset of right-sided weakness, aphasia and slurred speech.  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  MRI did not show any significant vascular abnormality.  ICH was most likely in the setting of Eliquis and uncontrolled hypertension.  Carotid Doppler unremarkable.  2D echo showed 55 to 60% EF.  Hemoglobin A1c was 5.9.  LDL of 94.  Eliquis discontinued.  PT/OT recommended CIR on discharge. ? ?Hypertension: Takes losartan, Cardizem, nadolol at home.  Was started on Cleviprex for blood pressure control.  Resumed home medications, added hydralazine.  Monitor blood pressure, currently stable ? ?Hyperlipidemia: LDL of 94.  She does not take any medications at home.  We will consider low-dose statin, likely Lipitor 20 mg daily on discharge.  Goal of LDL less than 70. ? ?Paroxysmal A-fib: Monitor on telemetry.  Takes Eliquis at home now discontinued due to intracerebral hemorrhage.  Eliquis was reversed with Andexxa.  On rate control with Cardizem, nadolol.  Neurology recommended resumption of anticoagulation after resolution of IPH, should be done as an  outpatient. ? ?Dysphagia: Speech therapy was following.  On dysphagia 3 diet. ? ?Peripheral vascular disease/history of DVT: Was taking Eliquis, now on hold. ? ?  ? ? ?  ?  ? ?DVT prophylaxis:heparin injection 5,000 Units Start: 03/26/22 1630 ?SCD's Start: 03/25/22 0248 ? ? ?  Code Status: Full Code ? ?Family Communication: Discussed with daughter at bedside ? ?Patient status: Inpatient ? ?Patient is from : Home ? ?Anticipated discharge to: CIR ? ?Estimated DC date: As soon as bed is available ? ? ?Consultants: Neurology ? ?Procedures: None ? ?Antimicrobials:  ?Anti-infectives (From admission, onward)  ? ? None  ? ?  ? ? ?Subjective: ?Patient seen and examined at bedside this morning.  Hemodynamically stable during my evaluation.  Daughter at bedside.  She was sleeping when I arrived.  She looked comfortable.  Still weak on the right side but her speech might have improved.  She knew that she is in the hospital.  Not any sign of distress.  Daughter was concerned about poor oral intake. ? ?Objective: ?Vitals:  ? 03/27/22 0417 03/27/22 0500 03/27/22 0600 03/27/22 0700  ?BP: (!) 157/70 (!) 160/64 (!) 146/67 136/61  ?Pulse: 73 74 69 73  ?Resp: 19 (!) '22 16 16  '$ ?Temp:    98.4 ?F (36.9 ?C)  ?TempSrc:    Oral  ?SpO2: 95% 95% 94% 96%  ?Weight:      ?Height:      ? ? ?Intake/Output Summary (Last 24 hours) at 03/27/2022 0751 ?Last data filed at 03/26/2022 2000 ?Gross per 24 hour  ?Intake 110.92 ml  ?Output 1150 ml  ?Net -1039.08 ml  ? ?Filed Weights  ? 03/25/22 0200  ?Weight: 66 kg  ? ? ?Examination: ? ?  General exam: Overall comfortable, not in distress, lying in bed ?HEENT: PERRL ?Respiratory system:  no wheezes or crackles  ?Cardiovascular system: S1 & S2 heard, RRR.  ?Gastrointestinal system: Abdomen is nondistended, soft and nontender. ?Central nervous system: Oriented to place, sleeping so full orientation could not be done, right hemiparesis ?Extremities: No edema, no clubbing ,no cyanosis ?Skin: No rashes, no ulcers,no  icterus   ? ? ?Data Reviewed: I have personally reviewed following labs and imaging studies ? ?CBC: ?Recent Labs  ?Lab 03/25/22 ?0250 03/25/22 ?2130 03/26/22 ?8657 03/27/22 ?0239  ?WBC 5.3  --  10.3 9.8  ?NEUTROABS 2.8  --   --   --   ?HGB 9.4* 11.9* 11.4* 11.6*  ?HCT 28.7* 35.0* 36.6 36.1  ?MCV 91.1  --  90.8 90.5  ?PLT 208  --  272 264  ? ?Basic Metabolic Panel: ?Recent Labs  ?Lab 03/25/22 ?0250 03/25/22 ?8469 03/26/22 ?6295 03/27/22 ?0239  ?NA 143 144 139 140  ?K 3.8 3.8 3.6 4.3  ?CL 110 108 107 110  ?CO2 25  --  26 24  ?GLUCOSE 116* 117* 102* 107*  ?BUN '19 20 11 13  '$ ?CREATININE 0.87 0.90 0.77 0.80  ?CALCIUM 9.2  --  8.9 9.2  ? ? ? ?Recent Results (from the past 240 hour(s))  ?MRSA Next Gen by PCR, Nasal     Status: None  ? Collection Time: 03/25/22  4:11 AM  ? Specimen: Nasal Mucosa; Nasal Swab  ?Result Value Ref Range Status  ? MRSA by PCR Next Gen NOT DETECTED NOT DETECTED Final  ?  Comment: (NOTE) ?The GeneXpert MRSA Assay (FDA approved for NASAL specimens only), ?is one component of a comprehensive MRSA colonization surveillance ?program. It is not intended to diagnose MRSA infection nor to guide ?or monitor treatment for MRSA infections. ?Test performance is not FDA approved in patients less than 2 years ?old. ?Performed at [facility], [place] [address] [place], [place] ?[address] ?  ?  ? ?Radiology Studies: ?CT HEAD WO CONTRAST (5MM) ? ?Result Date: 03/25/2022 ?CLINICAL DATA:  Hemorrhagic stroke EXAM: CT HEAD WITHOUT CONTRAST TECHNIQUE: Contiguous axial images were obtained from the base of the skull through the vertex without intravenous contrast. RADIATION DOSE REDUCTION: This exam was performed according to the departmental dose-optimization program which includes automated exposure control, adjustment of the mA and/or kV according to patient size and/or use of iterative reconstruction technique. COMPARISON:  03/25/2022 at 8 o'clock a.m. FINDINGS: Brain: Unchanged area of intraparenchymal  hemorrhage in the left thalamus. Small amount of intraventricular blood is also unchanged. No midline shift or other mass effect. Mild edema surrounding the hemorrhage site. There is periventricular hypoattenuation compatible with chronic microvascular disease. Vascular: No abnormal hyperdensity of the major intracranial arteries or dural venous sinuses. No intracranial atherosclerosis. Skull: The visualized skull base, calvarium and extracranial soft tissues are normal. Sinuses/Orbits: No fluid levels or advanced mucosal thickening of the visualized paranasal sinuses. No mastoid or middle ear effusion. The orbits are normal. IMPRESSION: Unchanged area of intraparenchymal hemorrhage in the left thalamus with small amount of intraventricular blood. Electronically Signed   By: Ulyses Jarred M.D.   On: 03/25/2022 21:43  ? ?CT HEAD WO CONTRAST ? ?Result Date: 03/25/2022 ?CLINICAL DATA:  Stroke, hemorrhagic EXAM: CT HEAD WITHOUT CONTRAST TECHNIQUE: Contiguous axial images were obtained from the base of the skull through the vertex without intravenous contrast. RADIATION DOSE REDUCTION: This exam was performed according to the departmental dose-optimization program which includes automated exposure  control, adjustment of the mA and/or kV according to patient size and/or use of iterative reconstruction technique. COMPARISON:  Earlier same day FINDINGS: Brain: Acute parenchymal hemorrhage centered in the left thalamus and adjacent white matter is again identified. Size is similar. There is slightly increased intraventricular extension of hemorrhage. Similar surrounding edema with associated mild mass effect. No hydrocephalus. No new loss of gray-white differentiation. Stable findings of probable chronic microvascular ischemic changes in the cerebral white matter. Vascular: No new findings. Skull: Calvarium is unremarkable. Sinuses/Orbits: No acute finding. Other: None. IMPRESSION: Similar left thalamic hemorrhage and  associated edema and mild mass effect. Slightly increased intraventricular extension. No hydrocephalus. Electronically Signed   By: Macy Mis M.D.   On: 03/25/2022 08:18  ? ?MR ANGIO HEAD WO CONTRAST ? ?Result Date: 03/25/2022 ?CLINICAL

## 2022-03-28 DIAGNOSIS — I161 Hypertensive emergency: Secondary | ICD-10-CM | POA: Diagnosis not present

## 2022-03-28 DIAGNOSIS — N179 Acute kidney failure, unspecified: Secondary | ICD-10-CM

## 2022-03-28 DIAGNOSIS — I482 Chronic atrial fibrillation, unspecified: Secondary | ICD-10-CM | POA: Diagnosis not present

## 2022-03-28 DIAGNOSIS — I610 Nontraumatic intracerebral hemorrhage in hemisphere, subcortical: Secondary | ICD-10-CM | POA: Diagnosis not present

## 2022-03-28 LAB — BASIC METABOLIC PANEL
Anion gap: 5 (ref 5–15)
BUN: 22 mg/dL (ref 8–23)
CO2: 23 mmol/L (ref 22–32)
Calcium: 9.2 mg/dL (ref 8.9–10.3)
Chloride: 112 mmol/L — ABNORMAL HIGH (ref 98–111)
Creatinine, Ser: 1.01 mg/dL — ABNORMAL HIGH (ref 0.44–1.00)
GFR, Estimated: 51 mL/min — ABNORMAL LOW (ref >60)
Glucose, Bld: 97 mg/dL (ref 70–99)
Potassium: 3.5 mmol/L (ref 3.5–5.1)
Sodium: 140 mmol/L (ref 135–145)

## 2022-03-28 LAB — CBC
HCT: 34.3 % — ABNORMAL LOW (ref 36.0–46.0)
Hemoglobin: 10.8 g/dL — ABNORMAL LOW (ref 12.0–15.0)
MCH: 28.5 pg (ref 26.0–34.0)
MCHC: 31.5 g/dL (ref 30.0–36.0)
MCV: 90.5 fL (ref 80.0–100.0)
Platelets: 257 10*3/uL (ref 150–400)
RBC: 3.79 MIL/uL — ABNORMAL LOW (ref 3.87–5.11)
RDW: 15 % (ref 11.5–15.5)
WBC: 9 10*3/uL (ref 4.0–10.5)
nRBC: 0 % (ref 0.0–0.2)

## 2022-03-28 MED ORDER — CHLORHEXIDINE GLUCONATE CLOTH 2 % EX PADS
6.0000 | MEDICATED_PAD | Freq: Every day | CUTANEOUS | Status: DC
  Administered 2022-03-28 – 2022-04-02 (×6): 6 via TOPICAL

## 2022-03-28 NOTE — Progress Notes (Signed)
Inpatient Rehab Admissions Coordinator:   Met with pt and her son at the bedside to discuss CIR recommendations and goals/expectations of CIR stay.  I reviewed 3 hrs/day of therapy, physician follow, and average length of stay 2 weeks (dependent upon progress) with goals of min to mod assist.  I also reviewed need for prior auth with [employer] and that insurance typically will not approve SNF following CIR admission.  Per son Lucianne Lei), pt has an aid at home 6 hrs/day 4x/week already and they are able to increase that coverage between hired caregivers and family.  I again reviewed that expectations would be that pt could complete 50-75% of any given task but would need assist for essentially everything (bathing, dressing, toileting, feeding, mobility).  We will plan to start insurance auth request today and follow for potential admit pending approval and bed availability.   Shann Medal, PT, DPT Admissions Coordinator [phone] 03/28/22  11:27 AM

## 2022-03-28 NOTE — Progress Notes (Signed)
PROGRESS NOTE  [name]  PYP:[id number] DOB: [birth year] DOA: 03/25/2022 PCP: [name], MD   Brief Narrative:  Patient is a 88 year old female with history of DVT, hypertension, hypothyroidism, GERD, peripheral vascular disease, A-fib on Eliquis who presented with acute onset of right-sided weakness, aphasia and slurred speech.  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  After admission, her Eliquis was reversed with Andexxa.  Patient was also placed on Cleviprex for blood pressure control.  She was on neurology service, transferred to [facility] service on 5/17.  PT/OT evaluation done and recommended CIR.  Waiting for placement.  Medically stable for discharge.   Assessment & Plan:  Principal Problem:   ICH (intracerebral hemorrhage) ([place]) Active Problems:   Hypertensive emergency   Intracerebral hemorrhage: Presented with acute onset of right-sided weakness, aphasia and slurred speech.  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  MRI did not show any significant vascular abnormality.  ICH was most likely in the setting of Eliquis and uncontrolled hypertension.  Carotid Doppler unremarkable.  2D echo showed 55 to 60% EF.  Hemoglobin A1c was 5.9.  LDL of 94.  Eliquis discontinued.  PT/OT recommended CIR on discharge.  Hypertension: Takes losartan, Cardizem, nadolol at home.  Was started on Cleviprex for blood pressure control.  Resumed home medications, added hydralazine.  Monitor blood pressure, currently stable  Hyperlipidemia: LDL of 94.  She does not take any medications at home.  Started on Lipitor 20 mg daily.  Goal of LDL less than 70.  Paroxysmal A-fib: Monitor on telemetry.  Takes Eliquis at home now discontinued due to intracerebral hemorrhage.  Eliquis was reversed with Andexxa.  On rate control with Cardizem, nadolol.  Neurology recommended resumption of anticoagulation after resolution of IPH, should be done as an outpatient.  Dysphagia: Speech  therapy was following.  On dysphagia 3 diet.  Peripheral vascular disease/history of DVT: Was taking Eliquis, now on hold.           DVT prophylaxis:heparin injection 5,000 Units Start: 03/26/22 1630 SCD's Start: 03/25/22 0248     Code Status: Full Code  Family Communication: Discussed with daughter at bedside on 5/17  Patient status: Inpatient  Patient is from : Home  Anticipated discharge to: CIR  Estimated DC date: As soon as bed is available   Consultants: Neurology  Procedures: None  Antimicrobials:  Anti-infectives (From admission, onward)    None       Subjective: Patient seen and examined at the bedside this morning.  Hemodynamically stable.  Lying on bed.  She is confused but not agitated.  She tries to speak but has dysarthria. Objective: Vitals:   03/27/22 2343 03/28/22 0351 03/28/22 0556 03/28/22 0732  BP: 103/72 (!) 133/93 (!) 138/56 (!) 116/55  Pulse: 69 69 68 73  Resp: '19 16  18  '$ Temp: 98.5 F (36.9 C) 97.7 F (36.5 C)  98.3 F (36.8 C)  TempSrc: Axillary Oral  Oral  SpO2: 98% 97% 96% 96%  Weight:      Height:        Intake/Output Summary (Last 24 hours) at 03/28/2022 1058 Last data filed at 03/27/2022 2315 Gross per 24 hour  Intake --  Output 450 ml  Net -450 ml   Filed Weights   03/25/22 0200  Weight: 66 kg    Examination:   General exam: Overall comfortable, not in distress, weak, deconditioned HEENT: PERRL Respiratory system:  no wheezes or crackles  Cardiovascular system: S1 & S2 heard, RRR.  Gastrointestinal system:  Abdomen is nondistended, soft and nontender. Central nervous system: Alert and awake, dysarthria, not fully oriented Extremities: No edema, no clubbing ,no cyanosis Skin: No rashes, no ulcers,no icterus     Data Reviewed: I have personally reviewed following labs and imaging studies  CBC: Recent Labs  Lab 03/25/22 0250 03/25/22 0255 03/26/22 0433 03/27/22 0239 03/28/22 0427  WBC 5.3  --  10.3  9.8 9.0  NEUTROABS 2.8  --   --   --   --   HGB 9.4* 11.9* 11.4* 11.6* 10.8*  HCT 28.7* 35.0* 36.6 36.1 34.3*  MCV 91.1  --  90.8 90.5 90.5  PLT 208  --  272 264 993   Basic Metabolic Panel: Recent Labs  Lab 03/25/22 0250 03/25/22 0255 03/26/22 0433 03/27/22 0239 03/28/22 0427  NA 143 144 139 140 140  K 3.8 3.8 3.6 4.3 3.5  CL 110 108 107 110 112*  CO2 25  --  '26 24 23  '$ GLUCOSE 116* 117* 102* 107* 97  BUN '19 20 11 13 22  '$ CREATININE 0.87 0.90 0.77 0.80 1.01*  CALCIUM 9.2  --  8.9 9.2 9.2     Recent Results (from the past 240 hour(s))  MRSA Next Gen by PCR, Nasal     Status: None   Collection Time: 03/25/22  4:11 AM   Specimen: Nasal Mucosa; Nasal Swab  Result Value Ref Range Status   MRSA by PCR Next Gen NOT DETECTED NOT DETECTED Final    Comment: (NOTE) The GeneXpert MRSA Assay (FDA approved for NASAL specimens only), is one component of a comprehensive MRSA colonization surveillance program. It is not intended to diagnose MRSA infection nor to guide or monitor treatment for MRSA infections. Test performance is not FDA approved in patients less than 74 years old. Performed at [facility], [place] [address] [place], [place] [address]      Radiology Studies: CT HEAD WO CONTRAST (5MM)  Result Date: 03/27/2022 CLINICAL DATA:  Stroke, hemorrhagic EXAM: CT HEAD WITHOUT CONTRAST TECHNIQUE: Contiguous axial images were obtained from the base of the skull through the vertex without intravenous contrast. RADIATION DOSE REDUCTION: This exam was performed according to the departmental dose-optimization program which includes automated exposure control, adjustment of the mA and/or kV according to patient size and/or use of iterative reconstruction technique. COMPARISON:  03/25/2022 FINDINGS: Brain: Persistent but slightly decreased size of parenchymal hemorrhage centered in the left thalamus. Similar surrounding edema and mild mass effect. Intraventricular extension again noted  with similar layering blood products in the occipital horns. No hydrocephalus. No new hemorrhage. Stable findings of parenchymal volume loss and probable chronic microvascular ischemic changes in the cerebral white matter. Vascular: There is atherosclerotic calcification at the skull base. Skull: Calvarium is unremarkable. Sinuses/Orbits: No acute finding. Other: None. IMPRESSION: Similar evolving left thalamic hemorrhage with intraventricular extension. No new hemorrhage or worsening mass effect. Electronically Signed   By: Macy Mis M.D.   On: 03/27/2022 10:34    Scheduled Meds:  atorvastatin  20 mg Oral Daily   chlorhexidine  15 mL Mouth Rinse BID   diltiazem  120 mg Oral QPM   gabapentin  300 mg Oral QHS   heparin injection (subcutaneous)  5,000 Units Subcutaneous Q8H   hydrALAZINE  50 mg Oral Q8H   levothyroxine  50 mcg Oral Daily   losartan  100 mg Oral Daily   mouth rinse  15 mL Mouth Rinse q12n4p   nadolol  80 mg Oral Daily  pantoprazole  40 mg Oral Daily   senna-docusate  1 tablet Oral BID   sodium chloride flush  3 mL Intravenous Once   Continuous Infusions:   LOS: 3 days   Shelly Coss, MD [facility] P5/18/2023, 10:58 AM

## 2022-03-28 NOTE — Progress Notes (Signed)
Physical Therapy Treatment Patient Details Name: [name] MRN: [id number] DOB: 08-19-1923 Today's Date: 03/28/2022   History of Present Illness Pt is 88 yo female who presents with R sided weakness, aphasia, and slurred speech. CT head showed L thalamic IPH.  PMH: DVT, HTN, hypothyoroidism, GERD, PVD, Afib.    PT Comments    Pt responses during session very variable, sometime speech is very clear and intelligible other times slurred and nonsensical. Command follow, motor planning and sequencing with mobility variable. Pt with min-maxA for static sitting due to R lateral lean, apparent R sided neglect unable to track across midline and then other times able to attend to R side. Pt maxA for bed mobility and transfer to recliner. D/c plan remains appropriate at this time. PT will continue to follow acutely.   Recommendations for follow up therapy are one component of a multi-disciplinary discharge planning process, led by the attending physician.  Recommendations may be updated based on patient status, additional functional criteria and insurance authorization.  Follow Up Recommendations  Acute inpatient rehab (3hours/day)     Assistance Recommended at Discharge Frequent or constant Supervision/Assistance  Patient can return home with the following Two people to help with walking and/or transfers;Two people to help with bathing/dressing/bathroom;Assistance with cooking/housework;Assistance with feeding;Direct supervision/assist for medications management;Direct supervision/assist for financial management;Assist for transportation;Help with stairs or ramp for entrance   Equipment Recommendations  Wheelchair (measurements PT);Wheelchair cushion (measurements PT) (hoyer lift)    Recommendations for Other Services       Precautions / Restrictions Precautions Precautions: Fall Precaution Comments: weakness LLE from back surgery, cartilage damage R knee. Falling has been an issue past few  yrs (hip fx, wrist fx) but she has not had any recent falls Restrictions Weight Bearing Restrictions: No     Mobility  Bed Mobility Overal bed mobility: Needs Assistance Bed Mobility: Supine to Sit   Sidelying to sit: Max assist       General bed mobility comments: pt able to navigate LE to EoB, requires maxA for bringing trunk to upright and scooting hips to EoB, maximal multimodal cues provided throughout    Transfers Overall transfer level: Needs assistance Equipment used: 1 person hand held assist Transfers: Bed to chair/wheelchair/BSC       Squat pivot transfers: Max assist     General transfer comment: limited in pivot to L by increased R lateral lean, on first attempt pt extends L knee unable to perform pivot, PT sat pt back down and repositioned and blocked L knee for pivot to L    Ambulation/Gait               General Gait Details: unable    Modified Rankin (Stroke Patients Only) Modified Rankin (Stroke Patients Only) Pre-Morbid Rankin Score: No symptoms Modified Rankin: Severe disability     Balance Overall balance assessment: Needs assistance Sitting-balance support: Single extremity supported, Feet supported Sitting balance-Leahy Scale: Poor Sitting balance - Comments: R lateral lean in sitting, pushing to R intermittently with LUE Postural control: Right lateral lean     Standing balance comment: unable to come to standing                            Cognition Arousal/Alertness: Awake/alert Behavior During Therapy: Flat affect Overall Cognitive Status: Difficult to assess Area of Impairment: Attention, Following commands, Safety/judgement, Awareness, Problem solving  Orientation Level: Disoriented to, Place, Situation Current Attention Level: Sustained   Following Commands: Follows one step commands with increased time Safety/Judgement: Decreased awareness of safety, Decreased awareness of  deficits Awareness: Intellectual Problem Solving: Slow processing, Requires verbal cues, Difficulty sequencing, Requires tactile cues General Comments: difficult to assess 2/2 aphasia           General Comments General comments (skin integrity, edema, etc.): VSS on RA, son in room reporting her impairments are profound      Pertinent Vitals/Pain Pain Assessment Pain Assessment: Faces Faces Pain Scale: Hurts a little bit Pain Location: back Pain Descriptors / Indicators: Grimacing Pain Intervention(s): Limited activity within patient's tolerance, Monitored during session, Repositioned     PT Goals (current goals can now be found in the care plan section) Acute Rehab PT Goals Patient Stated Goal: none stated PT Goal Formulation: With patient/family Time For Goal Achievement: 04/09/22 Potential to Achieve Goals: Fair Progress towards PT goals: Progressing toward goals    Frequency    Min 4X/week      PT Plan Current plan remains appropriate       AM-PAC PT "6 Clicks" Mobility   Outcome Measure  Help needed turning from your back to your side while in a flat bed without using bedrails?: A Lot Help needed moving from lying on your back to sitting on the side of a flat bed without using bedrails?: A Lot Help needed moving to and from a bed to a chair (including a wheelchair)?: A Lot Help needed standing up from a chair using your arms (e.g., wheelchair or bedside chair)?: Total Help needed to walk in hospital room?: Total Help needed climbing 3-5 steps with a railing? : Total 6 Click Score: 9    End of Session Equipment Utilized During Treatment: Gait belt Activity Tolerance: Patient tolerated treatment well Patient left: with call bell/phone within reach;with family/visitor present;in chair;with chair alarm set;Other (comment) (MD in room) Nurse Communication: Mobility status;Need for lift equipment PT Visit Diagnosis: Hemiplegia and hemiparesis;Pain;Difficulty in  walking, not elsewhere classified (R26.2) Hemiplegia - Right/Left: Right Hemiplegia - dominant/non-dominant: Dominant Hemiplegia - caused by: Nontraumatic SAH Pain - part of body:  (back)     Time: 1039-1100 PT Time Calculation (min) (ACUTE ONLY): 21 min  Charges:  $Therapeutic Activity: 8-22 mins                     Wahid Holley B. Migdalia Dk PT, DPT Acute Rehabilitation Services Please use secure chat or  Call Office [phone]    [facility] 03/28/2022, 1:45 PM

## 2022-03-28 NOTE — Progress Notes (Addendum)
STROKE TEAM PROGRESS NOTE   INTERVAL HISTORY Patient is seen in her room with her son at the bedside.  She is more alert today and has been able to get OOB with physical therapy.  She has been hemodynamically stable and her neurological exam is stable.  Vitals:   03/28/22 0351 03/28/22 0556 03/28/22 0732 03/28/22 1226  BP: (!) 133/93 (!) 138/56 (!) 116/55 (!) 117/54  Pulse: 69 68 73 62  Resp: '16  18 18  '$ Temp: 97.7 F (36.5 C)  98.3 F (36.8 C) 97.8 F (36.6 C)  TempSrc: Oral  Oral Oral  SpO2: 97% 96% 96% 98%  Weight:      Height:       CBC:  Recent Labs  Lab 03/25/22 0250 03/25/22 0255 03/27/22 0239 03/28/22 0427  WBC 5.3   < > 9.8 9.0  NEUTROABS 2.8  --   --   --   HGB 9.4*   < > 11.6* 10.8*  HCT 28.7*   < > 36.1 34.3*  MCV 91.1   < > 90.5 90.5  PLT 208   < > 264 257   < > = values in this interval not displayed.    Basic Metabolic Panel:  Recent Labs  Lab 03/27/22 0239 03/28/22 0427  NA 140 140  K 4.3 3.5  CL 110 112*  CO2 24 23  GLUCOSE 107* 97  BUN 13 22  CREATININE 0.80 1.01*  CALCIUM 9.2 9.2    Lipid Panel:  Recent Labs  Lab 03/26/22 0433  CHOL 177  TRIG 150*  HDL 53  CHOLHDL 3.3  VLDL 30  LDLCALC 94    HgbA1c:  Recent Labs  Lab 03/26/22 0433  HGBA1C 5.9*    Urine Drug Screen: No results for input(s): LABOPIA, COCAINSCRNUR, LABBENZ, AMPHETMU, THCU, LABBARB in the last 168 hours.  Alcohol Level No results for input(s): ETH in the last 168 hours.  IMAGING past 24 hours No results found.  PHYSICAL EXAM General:  Alert, well-nourished, well-developed elderly patient in no acute distress Respiratory:  Regular, unlabored respirations on room air  NEURO:  Mental Status: AA&Ox1 Speech/Language: speech is nonfluent with mild dysarthria and aphasia, perseveration on different words.  Repetition is intact with some extra words added, and naming is impaired    Cranial Nerves:  II: PERRL.  III, IV, VI: EOMI. Eyelids elevate symmetrically.   V: Sensation is intact to light touch and symmetrical to face.  VII: Right sided facial droop present  VIII: hearing intact to voice. IX, X: Phonation is normal.  XII: tongue is midline without fasciculations. Motor: 5/5 strength to LUE and LLE, 3/5 to RUE and 2/5 to RLE- right sided strength varies per physical therapist.  Tone: is normal and bulk is normal Sensation- Intact to light touch bilaterally.  Coordination: slowed RAM in right hand.  Gait- deferred   ASSESSMENT/PLAN Ms. TIANNA BAUS is a 88 y.o. female with history of DVT, HTN, hypothyroidism, GERD, PVD and atrial fibrillation on Eliquis presenting with acute onset right sided weakness, aphasia and slurred speech.  She was found to have a left thalamic ICH (Score 2).  Her Eliquis was reversed with Andexxa, and she was placed on initially placed on Cleviprex for BP control.  She has passed her swallow study, PO antihypertensives started, Cleviprex off. PT/OT/ST recommending CIR.  ICH score 2  ICH:  left thalamic ICH in setting of Eliquis use and hypertension Code Stroke CT head left thalamic ICH measuring 6 mL  with trace IVH, atrophy and small vessel disease MRI  Evolving left thalamic ICH with small IVH MRA  no significant vascular abnormlaity CT 03/27/22 stable hematoma and small IVH Carotid Doppler unremarkable 2D Echo EF 55 to 60% LDL 86 HgbA1c 5.9 VTE prophylaxis - heparin subq Eliquis (apixaban) daily prior to admission, now on No antithrombotic secondary to IPH Therapy recommendations:  CIR Disposition:  pending  Hypertension Home meds:  losartan 100 mg daily, Cardizem, nadolol off Cleviprex Resumed home meds  Add hydralazine '50mg'$  tid PRN labetalol and hydralazine BP goal < 160 Long-term BP goal normotensive  Hyperlipidemia Home meds:  none LDL 86, goal < 70 Consider low dose statin at discharge   Atrial fibrillation Patient has history of atrial fibrillation Was taking Eliquis at home Eliquis  reversed with Andexxa Consider resumption of anticoagulation vs. ASPIRE trial after IPH resolves  Dysphagia Moderate to severe dysarthria Passed swallow, on dysphagia 3 diet Speech on board  Other Stroke Risk Factors Advanced Age >/= 76  PVD History of DVT  Other Active Problems none  Hospital day # 3  Patient seen and examined by NP/APP with MD. MD to update note as needed.   [place] , MSN, AGACNP-BC [facility] See Amion for schedule and pager information 03/28/2022 1:04 PM  ATTENDING NOTE: I reviewed above note and agree with the assessment and plan. Pt was seen and examined.   Son at bedside.  Patient is working with PT/OT, sitting in chair, they recommended CIR.  Patient more awake alert than yesterday, still has partial global aphasia.  Able to name 2/3, able to repeat simple sentences, however intermittent word salad.  Perseveration on members during visual field testing.  Not answer orientation questions correctly.  However no gaze palsy, tracking bilaterally, slight right facial droop.  3-/5 right upper and lower extremities.  BP stable, continue current BP meds.  Creatinine 1.01, elevated from prior.  Encourage p.o. intake and avoid dehydration. I had long discussion with son at bedside, updated pt current condition, treatment plan and potential prognosis, and answered all the questions.  He expressed understanding and appreciation.   For detailed assessment and plan, please refer to above as I have made changes wherever appropriate.   Rosalin Hawking, MD PhD Stroke Neurology 03/28/2022 4:08 PM     To contact Stroke Continuity provider, please refer to [email or web address] After hours, contact General Neurology

## 2022-03-29 DIAGNOSIS — N390 Urinary tract infection, site not specified: Secondary | ICD-10-CM

## 2022-03-29 DIAGNOSIS — D72829 Elevated white blood cell count, unspecified: Secondary | ICD-10-CM

## 2022-03-29 DIAGNOSIS — N179 Acute kidney failure, unspecified: Secondary | ICD-10-CM | POA: Diagnosis not present

## 2022-03-29 DIAGNOSIS — I482 Chronic atrial fibrillation, unspecified: Secondary | ICD-10-CM | POA: Diagnosis not present

## 2022-03-29 DIAGNOSIS — I161 Hypertensive emergency: Secondary | ICD-10-CM | POA: Diagnosis not present

## 2022-03-29 DIAGNOSIS — I610 Nontraumatic intracerebral hemorrhage in hemisphere, subcortical: Secondary | ICD-10-CM | POA: Diagnosis not present

## 2022-03-29 LAB — BASIC METABOLIC PANEL
Anion gap: 9 (ref 5–15)
BUN: 33 mg/dL — ABNORMAL HIGH (ref 8–23)
CO2: 22 mmol/L (ref 22–32)
Calcium: 9 mg/dL (ref 8.9–10.3)
Chloride: 108 mmol/L (ref 98–111)
Creatinine, Ser: 1.4 mg/dL — ABNORMAL HIGH (ref 0.44–1.00)
GFR, Estimated: 34 mL/min — ABNORMAL LOW (ref >60)
Glucose, Bld: 94 mg/dL (ref 70–99)
Potassium: 3.6 mmol/L (ref 3.5–5.1)
Sodium: 139 mmol/L (ref 135–145)

## 2022-03-29 LAB — CBC
HCT: 32.4 % — ABNORMAL LOW (ref 36.0–46.0)
Hemoglobin: 10.5 g/dL — ABNORMAL LOW (ref 12.0–15.0)
MCH: 29 pg (ref 26.0–34.0)
MCHC: 32.4 g/dL (ref 30.0–36.0)
MCV: 89.5 fL (ref 80.0–100.0)
Platelets: 251 10*3/uL (ref 150–400)
RBC: 3.62 MIL/uL — ABNORMAL LOW (ref 3.87–5.11)
RDW: 14.9 % (ref 11.5–15.5)
WBC: 13 10*3/uL — ABNORMAL HIGH (ref 4.0–10.5)
nRBC: 0 % (ref 0.0–0.2)

## 2022-03-29 LAB — URINALYSIS, ROUTINE W REFLEX MICROSCOPIC

## 2022-03-29 LAB — URINALYSIS, MICROSCOPIC (REFLEX): WBC, UA: >50 WBC/hpf (ref 0–5)

## 2022-03-29 MED ORDER — SODIUM CHLORIDE 0.9 % IV SOLN: INTRAVENOUS | Status: DC

## 2022-03-29 MED ORDER — SODIUM CHLORIDE 0.9 % IV SOLN
1.0000 g | INTRAVENOUS | Status: DC
  Administered 2022-03-29 – 2022-03-30 (×2): 1 g via INTRAVENOUS
  Filled 2022-03-29 (×2): qty 10

## 2022-03-29 MED ORDER — TAMSULOSIN HCL 0.4 MG PO CAPS
0.4000 mg | ORAL_CAPSULE | Freq: Every day | ORAL | Status: DC
Start: 2022-03-29 — End: 2022-04-02
  Administered 2022-03-29 – 2022-04-02 (×5): 0.4 mg via ORAL
  Filled 2022-03-29 (×5): qty 1

## 2022-03-29 NOTE — Progress Notes (Addendum)
STROKE TEAM PROGRESS NOTE   INTERVAL HISTORY Patient is seen in her room with her son at the bedside. Increased confusion, UA and culture sent. Neurologically improving, speech is clear but slow. Strength in arm and leg slightly improved.  Vitals:   03/28/22 2356 03/29/22 0352 03/29/22 0601 03/29/22 0812  BP: (!) 126/52 (!) 125/56 (!) 132/52 (!) 121/56  Pulse: 65 64 64 67  Resp: '15 16  16  '$ Temp: 99.1 F (37.3 C) 98.6 F (37 C)  98.3 F (36.8 C)  TempSrc: Oral   Oral  SpO2: 96% 95% 95% 96%  Weight:      Height:       CBC:  Recent Labs  Lab 03/25/22 0250 03/25/22 0255 03/28/22 0427 03/29/22 0259  WBC 5.3   < > 9.0 13.0*  NEUTROABS 2.8  --   --   --   HGB 9.4*   < > 10.8* 10.5*  HCT 28.7*   < > 34.3* 32.4*  MCV 91.1   < > 90.5 89.5  PLT 208   < > 257 251   < > = values in this interval not displayed.    Basic Metabolic Panel:  Recent Labs  Lab 03/28/22 0427 03/29/22 0259  NA 140 139  K 3.5 3.6  CL 112* 108  CO2 23 22  GLUCOSE 97 94  BUN 22 33*  CREATININE 1.01* 1.40*  CALCIUM 9.2 9.0    Lipid Panel:  Recent Labs  Lab 03/26/22 0433  CHOL 177  TRIG 150*  HDL 53  CHOLHDL 3.3  VLDL 30  LDLCALC 94    HgbA1c:  Recent Labs  Lab 03/26/22 0433  HGBA1C 5.9*    Urine Drug Screen: No results for input(s): LABOPIA, COCAINSCRNUR, LABBENZ, AMPHETMU, THCU, LABBARB in the last 168 hours.  Alcohol Level No results for input(s): ETH in the last 168 hours.  IMAGING past 24 hours No results found.  PHYSICAL EXAM General:  Alert, well-nourished, well-developed elderly patient in no acute distress Respiratory:  Regular, unlabored respirations on room air  NEURO:  Mental Status: AA&Ox1 Speech/Language: speech is nonfluent with mild dysarthria and aphasia, perseveration on different words.  Repetition is intact with some extra words added, and naming simple objects intact. Able to speech in 3-4 word sentences.   Cranial Nerves:  II: PERRL.  III, IV, VI: EOMI.  Eyelids elevate symmetrically.  V: Sensation is intact to light touch and symmetrical to face.  VII: Right sided facial droop present  VIII: hearing intact to voice. IX, X: Phonation is normal.  XII: tongue is midline without fasciculations. Motor: 5/5 strength to LUE and LLE, 3/5 to RUE and 2/5 to RLE- right sided strength varies per physical therapist.  Tone: is normal and bulk is normal Sensation- Intact to light touch bilaterally.  Coordination: slowed RAM in right hand.  Gait- deferred   ASSESSMENT/PLAN [name] is a 88 y.o. female with history of DVT, HTN, hypothyroidism, GERD, PVD and atrial fibrillation on Eliquis presenting with acute onset right sided weakness, aphasia and slurred speech.  She was found to have a left thalamic ICH (Score 2).  Her Eliquis was reversed with Andexxa, and she was placed on initially placed on Cleviprex for BP control.  She has passed her swallow study, PO antihypertensives started, Cleviprex off. PT/OT/ST recommending CIR.  ICH score 2  ICH:  left thalamic ICH in setting of Eliquis use and hypertension Code Stroke CT head left thalamic ICH measuring 6 mL with  trace IVH, atrophy and small vessel disease MRI  Evolving left thalamic ICH with small IVH MRA  no significant vascular abnormlaity CT 03/27/22 stable hematoma and small IVH Carotid Doppler unremarkable 2D Echo EF 55 to 60% LDL 86 HgbA1c 5.9 VTE prophylaxis - heparin subq Eliquis (apixaban) daily prior to admission, now on No antithrombotic secondary to IPH Therapy recommendations:  CIR Disposition:  pending  Hypertension Home meds:  losartan 100 mg daily, Cardizem, nadolol off Cleviprex Resumed home meds  Add hydralazine '50mg'$  tid PRN labetalol and hydralazine BP goal < 160 Long-term BP goal normotensive  Hyperlipidemia Home meds:  none LDL 86, goal < 70 Consider low dose statin at discharge   Atrial fibrillation Patient has history of atrial fibrillation Was  taking Eliquis at home Eliquis reversed with Andexxa Consider resumption of anticoagulation vs. ASPIRE trial after IPH resolves  Lethargy  AKI UTI Leukocytosis Patient intermittently lethargic Creatinine 0.8-1.01-1.40, consistent with AKI.  Patient poor appetite, decreased p.o. intake, currently added IV fluid UA WBC >50, concerning for UTI, currently on Rocephin WBC 10.3-9.8-9.0-13.0 Urine culture pending  Dysphagia Moderate to severe dysarthria Passed swallow, on dysphagia 3 diet Speech on board Encourage p.o. intake On IV fluid  Other Stroke Risk Factors Advanced Age >/= 63  PVD History of DVT  Other Active Problems none  Hospital day # 4   Patient seen and examined by NP/APP with MD. MD to update note as needed.   Janine Ores, DNP, FNP-BC [facility] Pager: [phone]  ATTENDING NOTE: I reviewed above note and agree with the assessment and plan. Pt was seen and examined.   Son and granddaughter at bedside.  Patient initially lethargic, however gradually more interactive during the rounding.  She has right foot drop with joint pain on ankle DF/PF manipulation, educated on family to help joint movement passively.  Patient has a poor appetite, decreased p.o. intake, creatinine continue to elevate, consistent with AKI.  Put on IV fluid.  Urine cloudy, UA concerning for UTI, on Rocephin.  Urine culture pending.  WBC 13.0, leukocytosis.  Patient still has partial global aphasia however seems improved from yesterday.  Still has right hemiparesis.  PT/OT recommend CIR.  For detailed assessment and plan, please refer to above as I have made changes wherever appropriate.   Neurology will sign off. Please call with questions. Pt will follow up with stroke clinic NP at [facility] in about 4 weeks. Thanks for the consult.   Rosalin Hawking, MD PhD Stroke Neurology 03/29/2022 6:36 PM     To contact Stroke Continuity provider, please refer to [email or web address] After hours,  contact General Neurology

## 2022-03-29 NOTE — Progress Notes (Signed)
Seen patient lying on bed, looks lethargic but not in distress. Oriented to self, speech is clear, saying some inappropriate words. Tried to re-orient her with time and place. Able to follow simple commands. Bed at lowest position, call light within reach. Will continue to monitor.

## 2022-03-29 NOTE — TOC Initial Note (Signed)
Transition of Care [facility]) - Initial/Assessment Note    Patient Details  Name: [name] MRN: [id number] Date of Birth: 07/16/1923  Transition of Care [facility]) CM/SW Contact:    Pollie Friar, RN Phone Number: 03/29/2022, 1:51 PM  Clinical Narrative:                 Patient is from home alone. Recommendations are for CIR. Awaiting insurance approval.  TOC following.  Expected Discharge Plan: IP Rehab Facility Barriers to Discharge: Continued Medical Work up   Patient Goals and CMS Choice     Choice offered to / list presented to : Patient  Expected Discharge Plan and Services Expected Discharge Plan: [place]       Living arrangements for the past 2 months: Single Family Home                                      Prior Living Arrangements/Services Living arrangements for the past 2 months: Single Family Home Lives with:: Self Patient language and need for interpreter reviewed:: Yes              Criminal Activity/Legal Involvement Pertinent to Current Situation/Hospitalization: No - Comment as needed  Activities of Daily Living      Permission Sought/Granted                  Emotional Assessment       Orientation: : Oriented to Self   Psych Involvement: No (comment)  Admission diagnosis:  ICH (intracerebral hemorrhage) ([place]) [I61.9] Thalamic hemorrhage with stroke [facility]) [I61.9] Patient Active Problem List   Diagnosis Date Noted   ICH (intracerebral hemorrhage) ([place]) 03/25/2022   Hypertensive emergency    Osteoporosis 10/24/2021   Chronic anticoagulation 01/17/2021   Parastomal hernia s/p colostomy takedown & repair 01/17/2021 01/17/2021   Polymyalgia rheumatica ([place]) 01/11/2021   (HFpEF) heart failure with preserved ejection fraction ([place]) 10/27/2020   Injury of extensor or abductor muscle, fascia, or tendon of thumb at forearm level 08/03/2020   Orthostatic hypotension 05/24/2020   Acute blood loss anemia 05/19/2020    Traumatic hematoma of left hip 05/19/2020   Current chronic use of systemic steroids 05/19/2020   Advanced age 79/12/2019   Secondary hypercoagulable state ([place]) 03/08/2020   Hypokalemia 02/15/2020   History of diverticulitis of colon with perforation 01/26/2020   Anemia    Debility 01/14/2020   Physical debility 01/14/2020   Chronic atrial fibrillation ([place])    Recurrent falls 12/29/2019   Positive ANA (antinuclear antibody) 12/29/2019   Osteoarthritis 11/13/2017   Hypertriglyceridemia 01/29/2017   Excessive gas 01/09/2017   Physical exam 07/31/2016   Elevated lipase 03/06/2016   Chest pain 03/06/2016   Hypothyroidism 01/26/2016   HTN (hypertension) 01/26/2016   Left leg numbness 10/06/2012   PCP:  [name], MD Pharmacy:   [id number] [place], [place] - [address] [place] [address]Phone: [phone]: [phone]    Social Determinants of Health (SDOH) Interventions    Readmission Risk Interventions     View : No data to display.

## 2022-03-29 NOTE — Care Management Important Message (Signed)
Important Message  Patient Details  Name: [name] MRN: [id number] Date of Birth: 1923-10-22   Medicare Important Message Given:  Yes     Fe [address] 03/29/2022, 3:43 PM

## 2022-03-29 NOTE — Progress Notes (Signed)
Inpatient Rehab Admissions Coordinator:   Awaiting determination from [facility] regarding CIR prior auth request.   Shann Medal, PT, DPT Admissions Coordinator [phone] 03/29/22  9:59 AM

## 2022-03-29 NOTE — PMR Pre-admission (Signed)
PMR Admission Coordinator Pre-Admission Assessment   Patient: [name] is an 88 y.o., female MRN: [id number] DOB: 06/06/1923 Height: 5' 6" (167.6 cm) Weight: 66 kg   Insurance Information HMO:     PPO: yes     PCP:      IPA:      80/20:      OTHER:  PRIMARY: [employer]      Policy#: [id number]      Subscriber: pt CM Name: Lindsey      Phone#: [phone] ext [phone]     Fax#: [phone] Pre-Cert#: [id number] auth for CIR provided by faxed approval from Lindsey with [employer] with updates due to Lorraine P at fax listed above on 5/29 (phone [phone])      Employer:  Benefits:  Phone #: [phone]     Name:  Eff. Date: 11/12/19     Deduct: $0      Out of Pocket Max: $4000 (met $449.22)      Life Max: n/a CIR: $160/day for days 1-10      SNF: 100% Outpatient:      Co-Pay: $20/visit Home Health: 100%      Co-Pay:  DME: 80%     Co-Ins: 20% Providers:  SECONDARY:       Policy#:      Phone#:    Financial Counselor:       Phone#:    The "Data Collection Information Summary" for patients in Inpatient Rehabilitation Facilities with attached "Privacy Act Statement-Health Care Records" was provided and verbally reviewed with: Patient and Family   Emergency Contact Information Contact Information       Name Relation Home Work Mobile    Stone,Martha Daughter [phone]    Larock,Van Son [phone]   [phone]    Konecny,Thomas Son [phone]               Current Medical History  Patient Admitting Diagnosis: L thalamic/BG ICH   History of Present Illness: Pt is a 88 y/o female with PMH of DVT, HTN, PVD, afib (on eliquis), who presented to [facility] on 5/15 with c/o R sided weakness, aphasia/apraxia.  Imaging revealed a 6mL left basal ganglia ICH with trace IVH with hypertensive emergency on arrival.  Neurology following.  MRI showed evolving left thalamic ICH with small IVH.  MRA no significant vascular abnormality.   Repeat CTH 5/17 showed stable hematoma and IVH.  Dopplers unremarkable.  Echo showed EF 55-60%.  A1C 5.9.  Recommendations to stop eliquis due to IPH.  Pt initially required cleviprex, but is not BP manged on home meds with PRN labetalol and hydralazine.  Therapy ongoing and pt was recommended for CIR.    Complete NIHSS TOTAL: 7   Patient's medical record from  has been reviewed by the rehabilitation admission coordinator and physician.   Past Medical History      Past Medical History:  Diagnosis Date   Closed fracture of left distal radius 12/29/2019   DVT (deep venous thrombosis) (HCC) 09/2015    RLE   Dysrhythmia      Afib   GERD (gastroesophageal reflux disease)     Hip fracture (HCC) 12/02/2020   Hypertension     Hypothyroidism     Melanoma (HCC) 06/16/2017    Facial melanoma, removed by Dr. Leshin   Peripheral vascular disease (HCC)      peripheral neuropathy      Has the patient had major surgery during 100 days prior to   admission? No   Family History   family history includes COPD in her father; Cancer in her daughter and mother; Emphysema in her father.   Current Medications   Current Facility-Administered Medications:    acetaminophen (TYLENOL) tablet 650 mg, 650 mg, Oral, Q4H PRN, 650 mg at 04/01/22 2051 **OR** acetaminophen (TYLENOL) 160 MG/5ML solution 650 mg, 650 mg, Per Tube, Q4H PRN **OR** acetaminophen (TYLENOL) suppository 650 mg, 650 mg, Rectal, Q4H PRN, Khaliqdina, Salman, MD   ALPRAZolam (XANAX) tablet 0.25 mg, 0.25 mg, Oral, TID PRN, Adhikari, Amrit, MD   atorvastatin (LIPITOR) tablet 20 mg, 20 mg, Oral, Daily, Adhikari, Amrit, MD, 20 mg at 04/02/22 0939   cephALEXin (KEFLEX) capsule 500 mg, 500 mg, Oral, Q8H, Adhikari, Amrit, MD, 500 mg at 04/02/22 1418   chlorhexidine (PERIDEX) 0.12 % solution 15 mL, 15 mL, Mouth Rinse, BID, Xu, Jindong, MD, 15 mL at 04/02/22 0939   Chlorhexidine Gluconate Cloth 2 % PADS 6 each, 6 each, Topical, Daily, Adhikari,  Amrit, MD, 6 each at 04/02/22 0940   diltiazem (CARDIZEM CD) 24 hr capsule 120 mg, 120 mg, Oral, QPM, Xu, Jindong, MD, 120 mg at 04/01/22 1755   gabapentin (NEURONTIN) capsule 300 mg, 300 mg, Oral, QHS, Khaliqdina, Salman, MD, 300 mg at 04/01/22 2051   heparin injection 5,000 Units, 5,000 Units, Subcutaneous, Q8H, Xu, Jindong, MD, 5,000 Units at 04/02/22 1418   hydrALAZINE (APRESOLINE) injection 10-20 mg, 10-20 mg, Intravenous, Q4H PRN, Xu, Jindong, MD   hydrALAZINE (APRESOLINE) tablet 50 mg, 50 mg, Oral, Q8H, Xu, Jindong, MD, 50 mg at 04/02/22 1422   labetalol (NORMODYNE) injection 5-20 mg, 5-20 mg, Intravenous, Q2H PRN, Xu, Jindong, MD, 20 mg at 03/26/22 2016   levothyroxine (SYNTHROID) tablet 50 mcg, 50 mcg, Oral, Daily, Khaliqdina, Salman, MD, 50 mcg at 04/02/22 0622   MEDLINE mouth rinse, 15 mL, Mouth Rinse, q12n4p, Xu, Jindong, MD, 15 mL at 04/01/22 1700   nadolol (CORGARD) tablet 80 mg, 80 mg, Oral, Daily, Xu, Jindong, MD, 80 mg at 04/02/22 0938   nitroGLYCERIN (NITROSTAT) SL tablet 0.4 mg, 0.4 mg, Sublingual, Q5 min PRN, Adhikari, Amrit, MD, 0.4 mg at 04/02/22 1209   pantoprazole (PROTONIX) EC tablet 40 mg, 40 mg, Oral, Daily, Xu, Jindong, MD, 40 mg at 04/02/22 0939   predniSONE (DELTASONE) tablet 2 mg, 2 mg, Oral, Q lunch, Adhikari, Amrit, MD, 2 mg at 04/02/22 1208   senna-docusate (Senokot-S) tablet 1 tablet, 1 tablet, Oral, BID, Khaliqdina, Salman, MD, 1 tablet at 04/02/22 0939   tamsulosin (FLOMAX) capsule 0.4 mg, 0.4 mg, Oral, Daily, Adhikari, Amrit, MD, 0.4 mg at 04/02/22 0939   Patients Current Diet:  Diet Order                  DIET DYS 3 Room service appropriate? No; Fluid consistency: Thin  Diet effective 1400                         Precautions / Restrictions Precautions Precautions: Fall Precaution Comments: weakness LLE from back surgery, cartilage damage R knee. Falling has been an issue past few yrs (hip fx, wrist fx) but she has not had any recent  falls Restrictions Weight Bearing Restrictions: No    Has the patient had 2 or more falls or a fall with injury in the past year? No   Prior Activity Level Limited Community (1-2x/wk): living alone, caregivers present 6 hrs/day x4 days/week, mod I with RW (caregivers did IADLs and supervised ADLs),   not driving   Prior Functional Level Self Care: Did the patient need help bathing, dressing, using the toilet or eating? Needed some help   Indoor Mobility: Did the patient need assistance with walking from room to room (with or without device)? Independent   Stairs: Did the patient need assistance with internal or external stairs (with or without device)? Independent   Functional Cognition: Did the patient need help planning regular tasks such as shopping or remembering to take medications? Needed some help   Patient Information Are you of Hispanic, Latino/a,or Spanish origin?: X. Patient unable to respond, A. No, not of Hispanic, Latino/a, or Spanish origin (proxy) What is your race?: X. Patient unable to respond, A. White (proxy) Do you need or want an interpreter to communicate with a doctor or health care staff?: 0. No   Patient's Response To:  Health Literacy and Transportation Is the patient able to respond to health literacy and transportation needs?: No Health Literacy - How often do you need to have someone help you when you read instructions, pamphlets, or other written material from your doctor or pharmacy?: Patient unable to respond In the past 12 months, has lack of transportation kept you from medical appointments or from getting medications?: No (proxy) In the past 12 months, has lack of transportation kept you from meetings, work, or from getting things needed for daily living?: No (proxy)   Home Assistive Devices / Equipment Home Equipment: Rolling Walker (2 wheels), Grab bars - tub/shower, BSC/3in1, Cane - single point   Prior Device Use: Indicate devices/aids used by  the patient prior to current illness, exacerbation or injury? Walker   Current Functional Level Cognition   Arousal/Alertness: Lethargic Overall Cognitive Status: Impaired/Different from baseline Difficult to assess due to: Impaired communication Current Attention Level: Sustained Orientation Level: Oriented to person Following Commands: Follows one step commands with increased time Safety/Judgement: Decreased awareness of safety, Decreased awareness of deficits (R inattention) General Comments: pt anxious with getting OOB/mobility, pt reporting difficulty breathing, however pt endorses anxiety/nervousness with mobility when asked, SpO2 >98% on 2Lo2 via [place] Awareness: Appears intact    Extremity Assessment (includes Sensation/Coordination)   Upper Extremity Assessment: RUE deficits/detail RUE Deficits / Details: nearly full range with increased time and cues. ataxic movement. 3/5 grossly RUE Sensation: decreased light touch, decreased proprioception RUE Coordination: decreased fine motor, decreased gross motor  Lower Extremity Assessment: Defer to PT evaluation RLE Deficits / Details: hip flex <3/5, RLE weaker than L RLE Coordination: decreased gross motor     ADLs   Overall ADL's : Needs assistance/impaired Eating/Feeding: NPO Grooming: Wash/dry face, Wash/dry hands, Standing Grooming Details (indicate cue type and reason): completed standing at sink with LUE, WB through RUE Toilet Transfer: Moderate assistance, Maximal assistance, +2 for physical assistance, Stand-pivot, BSC/3in1 Toilet Transfer Details (indicate cue type and reason): simulated to chair Functional mobility during ADLs: Maximal assistance, Moderate assistance, +2 for physical assistance General ADL Comments: edcuated son on benefits of allowing pt. to attempt familiar tasks before assisting her with them     Mobility   Overal bed mobility: Needs Assistance Bed Mobility: Supine to Sit Rolling: Mod  assist Sidelying to sit: Max assist Supine to sit: Mod assist, HOB elevated, +2 for safety/equipment Sit to supine: +2 for physical assistance, Total assist General bed mobility comments: pt able to bring LEs over towards EOB with directional verbal cues and minA for R LE, modA for trunk elevation to EOB     Transfers   Overall   transfer level: Needs assistance Equipment used: 2 person hand held assist (face to face transfer) Transfers: Bed to chair/wheelchair/BSC, Sit to/from Stand Sit to Stand: +2 physical assistance, Max assist Bed to/from chair/wheelchair/BSC transfer type:: Step pivot Squat pivot transfers: Max assist Step pivot transfers: Max assist, +2 physical assistance General transfer comment: maxA x2 to power up with R knee blocked. pt with improved stepping ability with L LE, requires maxA to advance R LE     Ambulation / Gait / Stairs / Wheelchair Mobility   Ambulation/Gait General Gait Details: unable     Posture / Balance Dynamic Sitting Balance Sitting balance - Comments: pt with improved sitting balance this date, able to maintain midline with verbal cues, close min guard Balance Overall balance assessment: Needs assistance Sitting-balance support: Single extremity supported, Feet supported Sitting balance-Leahy Scale: Poor Sitting balance - Comments: pt with improved sitting balance this date, able to maintain midline with verbal cues, close min guard Postural control: Right lateral lean Standing balance support: Single extremity supported Standing balance-Leahy Scale: Zero Standing balance comment: dependent on PT and tech     Special needs/care consideration Diabetic management yes    Previous Home Environment (from acute therapy documentation) Living Arrangements: Alone  Lives With: Alone Available Help at Discharge: Personal care attendant, Family, Available PRN/intermittently Type of Home: House Home Layout: One level Home Access: Stairs to  enter Entrance Stairs-Rails: Right, Left, Can reach both Entrance Stairs-Number of Steps: 2 Bathroom Shower/Tub: Walk-in shower Bathroom Toilet: Handicapped height Bathroom Accessibility: Yes Additional Comments: caregiver comes 4 days/ wk, 9-3p. Numerous family members live within 2 mi and are alerted whenever she pushed life alert button as she did this time   Discharge Living Setting Plans for Discharge Living Setting: Patient's home Type of Home at Discharge: House Discharge Home Layout: One level Discharge Home Access: Stairs to enter Entrance Stairs-Rails: Can reach both Entrance Stairs-Number of Steps: 2 Discharge Bathroom Shower/Tub: Walk-in shower Discharge Bathroom Toilet: Handicapped height Discharge Bathroom Accessibility: Yes How Accessible: Accessible via walker Does the patient have any problems obtaining your medications?: No   Social/Family/Support Systems Anticipated Caregiver: children and hired caregivers Anticipated Caregiver's Contact Information: Martha (daughter) [phone]; Van (son) [phone] Ability/Limitations of Caregiver: n/a Caregiver Availability: 24/7 Discharge Plan Discussed with Primary Caregiver: Yes Is Caregiver In Agreement with Plan?: Yes Does Caregiver/Family have Issues with Lodging/Transportation while Pt is in Rehab?: No   Goals Patient/Family Goal for Rehab: PT/OT supervision to min assist, SLP min assist Expected length of stay: 14-16 days Pt/Family Agrees to Admission and willing to participate: Yes Program Orientation Provided & Reviewed with Pt/Caregiver Including Roles  & Responsibilities: Yes  Barriers to Discharge: Insurance for SNF coverage   Decrease burden of Care through IP rehab admission: n/a   Possible need for SNF placement upon discharge: Not anticipated.    Patient Condition: I have reviewed medical records from [facility], spoken with CM, and patient and son. I met with patient at the bedside for inpatient  rehabilitation assessment.  Patient will benefit from ongoing PT, OT, and SLP, can actively participate in 3 hours of therapy a day 5 days of the week, and can make measurable gains during the admission.  Patient will also benefit from the coordinated team approach during an Inpatient Acute Rehabilitation admission.  The patient will receive intensive therapy as well as Rehabilitation physician, nursing, social worker, and care management interventions.  Due to bladder management, bowel management, safety, skin/wound care, disease management, medication administration, pain management,   and patient education the patient requires 24 hour a day rehabilitation nursing.  The patient is currently max assist +1 with mobility and basic ADLs.  Discharge setting and therapy post discharge at home with home health is anticipated.  Patient has agreed to participate in the Acute Inpatient Rehabilitation Program and will admit today.   Preadmission Screen Completed By: Caitlin Warren, PT, DPT and  Caitlin E Warren, PT, DPT 04/02/2022 3:15 PM ______________________________________________________________________   Discussed status with Dr. Harlee Pursifull on 04/02/22  at 3:15 PM  and received approval for admission today.   Admission Coordinator:  Caitlin E Warren, PT, DPT time 3:15 PM /Date 04/02/22     Assessment/Plan: Diagnosis: left thalamic/basal ganglia hemorrhage Does the need for close, 24 hr/day Medical supervision in concert with the patient's rehab needs make it unreasonable for this patient to be served in a less intensive setting? Yes Co-Morbidities requiring supervision/potential complications: HTN, DVT, PVD, a fib Due to bladder management, bowel management, safety, skin/wound care, disease management, medication administration, pain management, and patient education, does the patient require 24 hr/day rehab nursing? Yes Does the patient require coordinated care of a physician, rehab nurse, PT, OT, and SLP to  address physical and functional deficits in the context of the above medical diagnosis(es)? Yes Addressing deficits in the following areas: balance, endurance, locomotion, strength, transferring, bowel/bladder control, bathing, dressing, feeding, grooming, toileting, cognition, speech, and psychosocial support Can the patient actively participate in an intensive therapy program of at least 3 hrs of therapy 5 days a week? Yes The potential for patient to make measurable gains while on inpatient rehab is excellent Anticipated functional outcomes upon discharge from inpatient rehab: supervision and min assist PT, supervision and min assist OT, min assist SLP Estimated rehab length of stay to reach the above functional goals is: 14-16 days Anticipated discharge destination: Home 10. Overall Rehab/Functional Prognosis: good     MD Signature: Crista Nuon T. Adileny Delon, MD, FAAPMR [facility] Medical Director Rehabilitation Services 04/02/2022 

## 2022-03-29 NOTE — Progress Notes (Signed)
Pt is now confused, states "I am at someone else's house. Where is my family?" This nurse attempted reorientation with poor results.  Pt fed, ate 25% of meal. Took meds crushed in applesauce, without difficulty.  Iv fluids hung per order, urine sent to lab.

## 2022-03-29 NOTE — Care Management Important Message (Signed)
Important Message  Patient Details  Name: [name] MRN: [id number] Date of Birth: [birth year]   Medicare Important Message Given:  Yes     [name] Montine Circle 03/29/2022, 11:42 AM

## 2022-03-29 NOTE — Progress Notes (Signed)
Physical Therapy Treatment Patient Details Name: [name] MRN: [id number] DOB: [birth year] Today's Date: 03/29/2022   History of Present Illness Pt is 88 yo female who presents with R sided weakness, aphasia, and slurred speech. CT head showed L thalamic IPH.  PMH: DVT, HTN, hypothyoroidism, GERD, PVD, Afib.    PT Comments    RN reports pt with increased confusion today. Pt reports to sons this morning that she is in someone else's house. Pt reports to PT that she is very worried, that she woke up in someone else's house this morning. Able to reorient pt to hospital and pt able to recall she is in hospital at end of session. Pt obviously tired but agreeable to getting up. Pt requiring more assist today maxAx2 to get to EoB.  Sat EoB for approximately 18 min working on balance and hand over hand assist for brushing her hair. At end of sitting PT told pt she could rest now and pt promptly leaned over on PT shoulder and fell asleep. Pt then required total A to return to supine. D/c plans remain appropriate at this time.     Recommendations for follow up therapy are one component of a multi-disciplinary discharge planning process, led by the attending physician.  Recommendations may be updated based on patient status, additional functional criteria and insurance authorization.  Follow Up Recommendations  Acute inpatient rehab (3hours/day)     Assistance Recommended at Discharge Frequent or constant Supervision/Assistance  Patient can return home with the following Two people to help with walking and/or transfers;Two people to help with bathing/dressing/bathroom;Assistance with cooking/housework;Assistance with feeding;Direct supervision/assist for medications management;Direct supervision/assist for financial management;Assist for transportation;Help with stairs or ramp for entrance   Equipment Recommendations  Wheelchair (measurements PT);Wheelchair cushion (measurements PT) (hoyer lift)     Recommendations for Other Services       Precautions / Restrictions Precautions Precautions: Fall Precaution Comments: weakness LLE from back surgery, cartilage damage R knee. Falling has been an issue past few yrs (hip fx, wrist fx) but she has not had any recent falls Restrictions Weight Bearing Restrictions: No     Mobility  Bed Mobility Overal bed mobility: Needs Assistance Bed Mobility: Supine to Sit     Supine to sit: Max assist, +2 for physical assistance Sit to supine: +2 for physical assistance, Total assist   General bed mobility comments: with assistance pt able to navigate LE to EoB, has difficulty following commands to reach across body and ultimately requires assist to trunk to upright and hips to EoB    Transfers                   General transfer comment: did not progress due to increased confusion, and falling asleep EoB    Ambulation/Gait               General Gait Details: unable      Modified Rankin (Stroke Patients Only) Modified Rankin (Stroke Patients Only) Pre-Morbid Rankin Score: No symptoms Modified Rankin: Severe disability     Balance Overall balance assessment: Needs assistance Sitting-balance support: Single extremity supported, Feet supported Sitting balance-Leahy Scale: Poor Sitting balance - Comments: R lateral lean in sitting, pushing to R intermittently with LUE Postural control: Right lateral lean     Standing balance comment: unable to come to standing                            Cognition Arousal/Alertness:  Awake/alert Behavior During Therapy: Flat affect Overall Cognitive Status: Difficult to assess Area of Impairment: Attention, Following commands, Safety/judgement, Awareness, Problem solving                 Orientation Level: Disoriented to, Place, Situation, Time Current Attention Level: Sustained   Following Commands: Follows one step commands with increased time, Follows one step  commands inconsistently Safety/Judgement: Decreased awareness of safety, Decreased awareness of deficits Awareness: Intellectual Problem Solving: Slow processing, Requires verbal cues, Difficulty sequencing, Requires tactile cues General Comments: increased confusion, thinks she is in someone else's house, sons assist in redirection to fact she is in hospital        Exercises  Sitting balance, weightshifting and completion of self care tasks.     General Comments General comments (skin integrity, edema, etc.): VSS on RA, sons Lucianne Lei and Konrad Dolores in room, told therapist they were requessted to engage their mother from the R side      Pertinent Vitals/Pain Pain Assessment Pain Assessment: Faces Pain Descriptors / Indicators: Grimacing Pain Intervention(s): Monitored during session, Limited activity within patient's tolerance, Repositioned     PT Goals (current goals can now be found in the care plan section) Acute Rehab PT Goals Patient Stated Goal: none stated PT Goal Formulation: With patient/family Time For Goal Achievement: 04/09/22 Potential to Achieve Goals: Fair Progress towards PT goals: Not progressing toward goals - comment    Frequency    Min 4X/week      PT Plan Current plan remains appropriate       AM-PAC PT "6 Clicks" Mobility   Outcome Measure  Help needed turning from your back to your side while in a flat bed without using bedrails?: A Lot Help needed moving from lying on your back to sitting on the side of a flat bed without using bedrails?: A Lot Help needed moving to and from a bed to a chair (including a wheelchair)?: Total Help needed standing up from a chair using your arms (e.g., wheelchair or bedside chair)?: Total Help needed to walk in hospital room?: Total Help needed climbing 3-5 steps with a railing? : Total 6 Click Score: 8    End of Session Equipment Utilized During Treatment: Gait belt Activity Tolerance: Patient tolerated treatment  well Patient left: with call bell/phone within reach;with family/visitor present;in bed;with bed alarm set Nurse Communication: Mobility status;Need for lift equipment PT Visit Diagnosis: Hemiplegia and hemiparesis;Pain;Difficulty in walking, not elsewhere classified (R26.2) Hemiplegia - Right/Left: Right Hemiplegia - dominant/non-dominant: Dominant Hemiplegia - caused by: Nontraumatic SAH Pain - part of body:  (back)     Time: 2683-4196 PT Time Calculation (min) (ACUTE ONLY): 29 min  Charges:  $Therapeutic Activity: 23-37 mins                     Gabrianna Fassnacht B. Migdalia Dk PT, DPT Acute Rehabilitation Services Please use secure chat or  Call Office [phone]    [place] 03/29/2022, 2:26 PM

## 2022-03-29 NOTE — Progress Notes (Signed)
This nurse assuming care, pt awake alert, speech is clear.  Denies pain or discomfort, no acute distress noted.  Bed low call light in reach.  Will continue to monitor.

## 2022-03-29 NOTE — Progress Notes (Signed)
Occupational Therapy Treatment Patient Details Name: [name] MRN: [id number] DOB: 1923/05/12 Today's Date: 03/29/2022   History of present illness Pt is 88 yo female who presents with R sided weakness, aphasia, and slurred speech. CT head showed L thalamic IPH.  PMH: DVT, HTN, hypothyoroidism, GERD, PVD, Afib.   OT comments  Pt. Seen for skilled OT session.  Pts. Son present for session.  Note documentation of increased confusion.  Pt. Engaged in grooming tasks with mod a.  Limited verbal instructions for encouraging pt. To complete familiar tasks from memory.  Current d/c recommendations remain appropriate.     Recommendations for follow up therapy are one component of a multi-disciplinary discharge planning process, led by the attending physician.  Recommendations may be updated based on patient status, additional functional criteria and insurance authorization.    Follow Up Recommendations  Acute inpatient rehab (3hours/day)    Assistance Recommended at Discharge Frequent or constant Supervision/Assistance  Patient can return home with the following  A lot of help with walking and/or transfers;A lot of help with bathing/dressing/bathroom;Assistance with cooking/housework;Assistance with feeding;Direct supervision/assist for medications management;Direct supervision/assist for financial management;Assist for transportation;Help with stairs or ramp for entrance   Equipment Recommendations       Recommendations for Other Services Rehab consult    Precautions / Restrictions Precautions Precautions: Fall Precaution Comments: weakness LLE from back surgery, cartilage damage R knee. Falling has been an issue past few yrs (hip fx, wrist fx) but she has not had any recent falls       Mobility Bed Mobility                    Transfers                         Balance                                           ADL either performed or assessed  with clinical judgement   ADL Overall ADL's : Needs assistance/impaired     Grooming: Wash/dry hands;Wash/dry face;Cueing for sequencing;Bed level;Moderate assistance (applying lotion) Grooming Details (indicate cue type and reason): cues for initiation but did better with decreased instruction for familiar tasks. educated son on need for allowing pt. to attempt task before assisting esp wtih familiar tasks to aide in memory and function                               General ADL Comments: edcuated son on benefits of allowing pt. to attempt familiar tasks before assisting her with them    Extremity/Trunk Assessment              Vision       Perception     Praxis      Cognition Arousal/Alertness: Awake/alert Behavior During Therapy: Flat affect Overall Cognitive Status: Difficult to assess                                 General Comments: noted increased confusion in documentation and from son who was present,        Exercises      Shoulder Instructions       General Comments  Pertinent Vitals/ Pain       Pain Assessment Pain Assessment: Faces Faces Pain Scale: Hurts even more Pain Location: R foot Pain Descriptors / Indicators: Aching, Grimacing, Other (Comment) (cont. to say "my leg hurts" "my foot hurts") Pain Intervention(s): Limited activity within patient's tolerance, Repositioned, Monitored during session  Home Living                                          Prior Functioning/Environment              Frequency  Min 2X/week        Progress Toward Goals  OT Goals(current goals can now be found in the care plan section)  Progress towards OT goals: Progressing toward goals     Plan      Co-evaluation                 AM-PAC OT "6 Clicks" Daily Activity     Outcome Measure   Help from another person eating meals?: Total Help from another person taking care of personal grooming?:  Total Help from another person toileting, which includes using toliet, bedpan, or urinal?: Total Help from another person bathing (including washing, rinsing, drying)?: Total Help from another person to put on and taking off regular upper body clothing?: Total Help from another person to put on and taking off regular lower body clothing?: Total 6 Click Score: 6    End of Session    OT Visit Diagnosis: Unsteadiness on feet (R26.81);Other abnormalities of gait and mobility (R26.89);Muscle weakness (generalized) (M62.81);History of falling (Z91.81);Hemiplegia and hemiparesis Hemiplegia - Right/Left: Right Hemiplegia - dominant/non-dominant: Dominant Hemiplegia - caused by: Nontraumatic intracerebral hemorrhage   Activity Tolerance Patient tolerated treatment well   Patient Left in bed;with call bell/phone within reach;with bed alarm set;with family/visitor present (NP in at end of session to meet with pt. and son)   Nurse Communication          Time: 9833-8250 OT Time Calculation (min): 9 min  Charges: OT General Charges $OT Visit: 1 Visit OT Treatments $Self Care/Home Management : 8-22 mins  [name] Baller, COTA/L Acute Rehabilitation [phone]   Tanya Nones 03/29/2022, 12:19 PM

## 2022-03-29 NOTE — Progress Notes (Signed)
PROGRESS NOTE  [name]  YCX:[id number] DOB: [birth year] DOA: 03/25/2022 PCP: [name], MD   Brief Narrative:  Patient is a 88 year old female with history of DVT, hypertension, hypothyroidism, GERD, peripheral vascular disease, A-fib on Eliquis who presented with acute onset of right-sided weakness, aphasia and slurred speech.  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  After admission, her Eliquis was reversed with Andexxa.  Patient was also placed on Cleviprex for blood pressure control.  She was on neurology service, transferred to [facility] service on 5/17.  PT/OT evaluation done and recommended CIR.  Waiting for placement.     Assessment & Plan:  Principal Problem:   ICH (intracerebral hemorrhage) ([place]) Active Problems:   Hypertensive emergency   Intracerebral hemorrhage: Presented with acute onset of right-sided weakness, aphasia and slurred speech.  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  MRI did not show any significant vascular abnormality.  ICH was most likely in the setting of Eliquis and uncontrolled hypertension.  Carotid Doppler unremarkable.  2D echo showed 55 to 60% EF.  Hemoglobin A1c was 5.9.  LDL of 94.  Eliquis discontinued.  PT/OT recommended CIR on discharge.  Hypertension: Takes losartan, Cardizem, nadolol at home.  Was started on Cleviprex for blood pressure control.  Resumed home medications, added hydralazine.  Monitor blood pressure, currently stable  Hyperlipidemia: LDL of 94.  She does not take any medications at home.  Started on Lipitor 20 mg daily.  Goal of LDL less than 70.  Paroxysmal A-fib: Monitor on telemetry.  Takes Eliquis at home now discontinued due to intracerebral hemorrhage.  Eliquis was reversed with Andexxa.  On rate control with Cardizem, nadolol.  Neurology recommended resumption of anticoagulation after resolution of IPH, should be done as an outpatient.  Dysphagia: Speech therapy was following.  On  dysphagia 3 diet.  Peripheral vascular disease/history of DVT: Was taking Eliquis, now on hold.  AKI: Creatinine trended up today to 1.4.  Started on gentle IV fluids.  Check BMP tomorrow  Leukocytosis: Unclear etiology.  Urine appears clear.  Will check UA  Urine retention: Foley placed.  Started on tamsulosin           DVT prophylaxis:heparin injection 5,000 Units Start: 03/26/22 1630 SCD's Start: 03/25/22 0248     Code Status: Full Code  Family Communication: Discussed with daughter at bedside on 5/19  Patient status: Inpatient  Patient is from : Home  Anticipated discharge to: CIR  Estimated DC date: As soon as bed is available   Consultants: Neurology  Procedures: None  Antimicrobials:  Anti-infectives (From admission, onward)    None       Subjective: Patient seen and examined at the bedside this morning.  Hemodynamically stable.  Lying in bed, confused and talking inappropriately.  Not agitated.  Speech looks clear today   Objective: Vitals:   03/28/22 2356 03/29/22 0352 03/29/22 0601 03/29/22 0812  BP: (!) 126/52 (!) 125/56 (!) 132/52 (!) 121/56  Pulse: 65 64 64 67  Resp: '15 16  16  '$ Temp: 99.1 F (37.3 C) 98.6 F (37 C)  98.3 F (36.8 C)  TempSrc: Oral   Oral  SpO2: 96% 95% 95% 96%  Weight:      Height:        Intake/Output Summary (Last 24 hours) at 03/29/2022 1009 Last data filed at 03/29/2022 0400 Gross per 24 hour  Intake 340 ml  Output 450 ml  Net -110 ml   Filed Weights   03/25/22 0200  Weight:  66 kg    Examination:   General exam: Overall comfortable, not in distress, lying in bed, confused HEENT: PERRL Respiratory system:  no wheezes or crackles  Cardiovascular system: S1 & S2 heard, RRR.  Gastrointestinal system: Abdomen is nondistended, soft and nontender. Central nervous system: Alert and awake, confused, right hemiparesis Extremities: No edema, no clubbing ,no cyanosis Skin: No rashes, no ulcers,no icterus     Data Reviewed: I have personally reviewed following labs and imaging studies  CBC: Recent Labs  Lab 03/25/22 0250 03/25/22 0255 03/26/22 0433 03/27/22 0239 03/28/22 0427 03/29/22 0259  WBC 5.3  --  10.3 9.8 9.0 13.0*  NEUTROABS 2.8  --   --   --   --   --   HGB 9.4* 11.9* 11.4* 11.6* 10.8* 10.5*  HCT 28.7* 35.0* 36.6 36.1 34.3* 32.4*  MCV 91.1  --  90.8 90.5 90.5 89.5  PLT 208  --  272 264 257 283   Basic Metabolic Panel: Recent Labs  Lab 03/25/22 0250 03/25/22 0255 03/26/22 0433 03/27/22 0239 03/28/22 0427 03/29/22 0259  NA 143 144 139 140 140 139  K 3.8 3.8 3.6 4.3 3.5 3.6  CL 110 108 107 110 112* 108  CO2 25  --  '26 24 23 22  '$ GLUCOSE 116* 117* 102* 107* 97 94  BUN '19 20 11 13 22 '$ 33*  CREATININE 0.87 0.90 0.77 0.80 1.01* 1.40*  CALCIUM 9.2  --  8.9 9.2 9.2 9.0     Recent Results (from the past 240 hour(s))  MRSA Next Gen by PCR, Nasal     Status: None   Collection Time: 03/25/22  4:11 AM   Specimen: Nasal Mucosa; Nasal Swab  Result Value Ref Range Status   MRSA by PCR Next Gen NOT DETECTED NOT DETECTED Final    Comment: (NOTE) The GeneXpert MRSA Assay (FDA approved for NASAL specimens only), is one component of a comprehensive MRSA colonization surveillance program. It is not intended to diagnose MRSA infection nor to guide or monitor treatment for MRSA infections. Test performance is not FDA approved in patients less than 77 years old. Performed at [facility], [place] [address], [place], [place] [address]      Radiology Studies: CT HEAD WO CONTRAST (5MM)  Result Date: 03/27/2022 CLINICAL DATA:  Stroke, hemorrhagic EXAM: CT HEAD WITHOUT CONTRAST TECHNIQUE: Contiguous axial images were obtained from the base of the skull through the vertex without intravenous contrast. RADIATION DOSE REDUCTION: This exam was performed according to the departmental dose-optimization program which includes automated exposure control, adjustment of the mA and/or kV  according to patient size and/or use of iterative reconstruction technique. COMPARISON:  03/25/2022 FINDINGS: Brain: Persistent but slightly decreased size of parenchymal hemorrhage centered in the left thalamus. Similar surrounding edema and mild mass effect. Intraventricular extension again noted with similar layering blood products in the occipital horns. No hydrocephalus. No new hemorrhage. Stable findings of parenchymal volume loss and probable chronic microvascular ischemic changes in the cerebral white matter. Vascular: There is atherosclerotic calcification at the skull base. Skull: Calvarium is unremarkable. Sinuses/Orbits: No acute finding. Other: None. IMPRESSION: Similar evolving left thalamic hemorrhage with intraventricular extension. No new hemorrhage or worsening mass effect. Electronically Signed   By: Macy Mis M.D.   On: 03/27/2022 10:34    Scheduled Meds:  atorvastatin  20 mg Oral Daily   chlorhexidine  15 mL Mouth Rinse BID   Chlorhexidine Gluconate Cloth  6 each Topical Daily   diltiazem  120 mg Oral QPM   gabapentin  300 mg Oral QHS   heparin injection (subcutaneous)  5,000 Units Subcutaneous Q8H   hydrALAZINE  50 mg Oral Q8H   levothyroxine  50 mcg Oral Daily   mouth rinse  15 mL Mouth Rinse q12n4p   nadolol  80 mg Oral Daily   pantoprazole  40 mg Oral Daily   senna-docusate  1 tablet Oral BID   sodium chloride flush  3 mL Intravenous Once   Continuous Infusions:  sodium chloride 75 mL/hr at 03/29/22 0855     LOS: 4 days   Shelly Coss, MD [facility] P5/19/2023, 10:09 AM

## 2022-03-30 DIAGNOSIS — I610 Nontraumatic intracerebral hemorrhage in hemisphere, subcortical: Secondary | ICD-10-CM | POA: Diagnosis not present

## 2022-03-30 LAB — BASIC METABOLIC PANEL
Anion gap: 8 (ref 5–15)
BUN: 32 mg/dL — ABNORMAL HIGH (ref 8–23)
CO2: 21 mmol/L — ABNORMAL LOW (ref 22–32)
Calcium: 8.8 mg/dL — ABNORMAL LOW (ref 8.9–10.3)
Chloride: 111 mmol/L (ref 98–111)
Creatinine, Ser: 1.2 mg/dL — ABNORMAL HIGH (ref 0.44–1.00)
GFR, Estimated: 41 mL/min — ABNORMAL LOW (ref >60)
Glucose, Bld: 109 mg/dL — ABNORMAL HIGH (ref 70–99)
Potassium: 3.7 mmol/L (ref 3.5–5.1)
Sodium: 140 mmol/L (ref 135–145)

## 2022-03-30 LAB — CBC
HCT: 30.7 % — ABNORMAL LOW (ref 36.0–46.0)
Hemoglobin: 10 g/dL — ABNORMAL LOW (ref 12.0–15.0)
MCH: 29 pg (ref 26.0–34.0)
MCHC: 32.6 g/dL (ref 30.0–36.0)
MCV: 89 fL (ref 80.0–100.0)
Platelets: 233 10*3/uL (ref 150–400)
RBC: 3.45 MIL/uL — ABNORMAL LOW (ref 3.87–5.11)
RDW: 14.9 % (ref 11.5–15.5)
WBC: 10.7 10*3/uL — ABNORMAL HIGH (ref 4.0–10.5)
nRBC: 0 % (ref 0.0–0.2)

## 2022-03-30 NOTE — Plan of Care (Signed)
  Problem: Coping: Goal: Will verbalize positive feelings about self Outcome: Progressing Goal: Will identify appropriate support needs Outcome: Progressing   Problem: Nutrition: Goal: Risk of aspiration will decrease Outcome: Progressing Goal: Dietary intake will improve Outcome: Progressing

## 2022-03-30 NOTE — Progress Notes (Signed)
PROGRESS NOTE  [name]  GGY:[id number] DOB: 1923/05/10 DOA: 03/25/2022 PCP: [name], MD   Brief Narrative:  Patient is a 88 year old female with history of DVT, hypertension, hypothyroidism, GERD, peripheral vascular disease, A-fib on Eliquis who presented with acute onset of right-sided weakness, aphasia and slurred speech.  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  After admission, her Eliquis was reversed with Andexxa.  Patient was also placed on Cleviprex for blood pressure control.  She was on neurology service, transferred to [facility] service on 5/17.  PT/OT evaluation done and recommended CIR.  Waiting for placement.     Assessment & Plan:  Principal Problem:   ICH (intracerebral hemorrhage) ([place]) Active Problems:   Hypertensive emergency   Intracerebral hemorrhage: Presented with acute onset of right-sided weakness, aphasia and slurred speech.  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  MRI did not show any significant vascular abnormality.  ICH was most likely in the setting of Eliquis and uncontrolled hypertension.  Carotid Doppler unremarkable.  2D echo showed 55 to 60% EF.  Hemoglobin A1c was 5.9.  LDL of 94.  Eliquis discontinued.  PT/OT recommended CIR on discharge.  Hypertension: Takes losartan, Cardizem, nadolol at home.  Was started on Cleviprex for blood pressure control.  Resumed home medications, added hydralazine.  Monitor blood pressure, currently stable  Hyperlipidemia: LDL of 94.  She does not take any medications at home.  Started on Lipitor 20 mg daily.  Goal of LDL less than 70.  Paroxysmal A-fib: Monitor on telemetry.  Takes Eliquis at home now discontinued due to intracerebral hemorrhage.  Eliquis was reversed with Andexxa.  On rate control with Cardizem, nadolol.  Neurology recommended resumption of anticoagulation after resolution of IPH, should be done as an outpatient.  Dysphagia: Speech therapy was following.  On  dysphagia 3 diet.  Peripheral vascular disease/history of DVT: Was taking Eliquis, now on hold.  AKI: Creatinine trended up today to 1.4.  Started on gentle IV fluids with improvement  UTI/Leukocytosis: Urine appears clear.  UA suggestive of UTI, urine culture sent.  Started on ceftriaxone  Urine retention: Foley placed,now removed.  Started on tamsulosin         DVT prophylaxis:heparin injection 5,000 Units Start: 03/26/22 1630 SCD's Start: 03/25/22 0248     Code Status: Full Code  Family Communication: Discussed with daughter on phone on 5/19  Patient status: Inpatient  Patient is from : Home  Anticipated discharge to: CIR  Estimated DC date: As soon as bed is available   Consultants: Neurology  Procedures: None  Antimicrobials:  Anti-infectives (From admission, onward)    Start     Dose/Rate Route Frequency Ordered Stop   03/29/22 1630  cefTRIAXone (ROCEPHIN) 1 g in sodium chloride 0.9 % 100 mL IVPB        1 g 200 mL/hr over 30 Minutes Intravenous Every 24 hours 03/29/22 1531         Subjective:  Patient seen and examined at the bedside this morning.  Hemodynamically stable.  Remains confused but not agitated.  Appears comfortable.  Objective: Vitals:   03/29/22 2346 03/30/22 0330 03/30/22 0534 03/30/22 0829  BP: (!) 105/53 (!) 124/53 (!) 143/53 (!) 129/55  Pulse: 64 67 66 70  Resp: '16 20  18  '$ Temp: 98.9 F (37.2 C) 98.6 F (37 C)  98.7 F (37.1 C)  TempSrc:  Oral    SpO2: 96% 97%  96%  Weight:      Height:  Intake/Output Summary (Last 24 hours) at 03/30/2022 1048 Last data filed at 03/30/2022 0315 Gross per 24 hour  Intake --  Output 700 ml  Net -700 ml   Filed Weights   03/25/22 0200  Weight: 66 kg    Examination:  General exam: Overall comfortable, not in distress HEENT: PERRL Respiratory system:  no wheezes or crackles  Cardiovascular system: S1 & S2 heard, RRR.  Gastrointestinal system: Abdomen is nondistended, soft and  nontender. Central nervous system: awake but confused,right hemiparesis Extremities: No edema, no clubbing ,no cyanosis Skin: No rashes, no ulcers,no icterus    Data Reviewed: I have personally reviewed following labs and imaging studies  CBC: Recent Labs  Lab 03/25/22 0250 03/25/22 0255 03/26/22 0433 03/27/22 0239 03/28/22 0427 03/29/22 0259 03/30/22 0248  WBC 5.3  --  10.3 9.8 9.0 13.0* 10.7*  NEUTROABS 2.8  --   --   --   --   --   --   HGB 9.4*   < > 11.4* 11.6* 10.8* 10.5* 10.0*  HCT 28.7*   < > 36.6 36.1 34.3* 32.4* 30.7*  MCV 91.1  --  90.8 90.5 90.5 89.5 89.0  PLT 208  --  272 264 257 251 233   < > = values in this interval not displayed.   Basic Metabolic Panel: Recent Labs  Lab 03/26/22 0433 03/27/22 0239 03/28/22 0427 03/29/22 0259 03/30/22 0248  NA 139 140 140 139 140  K 3.6 4.3 3.5 3.6 3.7  CL 107 110 112* 108 111  CO2 '26 24 23 22 '$ 21*  GLUCOSE 102* 107* 97 94 109*  BUN '11 13 22 '$ 33* 32*  CREATININE 0.77 0.80 1.01* 1.40* 1.20*  CALCIUM 8.9 9.2 9.2 9.0 8.8*     Recent Results (from the past 240 hour(s))  MRSA Next Gen by PCR, Nasal     Status: None   Collection Time: 03/25/22  4:11 AM   Specimen: Nasal Mucosa; Nasal Swab  Result Value Ref Range Status   MRSA by PCR Next Gen NOT DETECTED NOT DETECTED Final    Comment: (NOTE) The GeneXpert MRSA Assay (FDA approved for NASAL specimens only), is one component of a comprehensive MRSA colonization surveillance program. It is not intended to diagnose MRSA infection nor to guide or monitor treatment for MRSA infections. Test performance is not FDA approved in patients less than 42 years old. Performed at [facility], [place] [address] [place], [place] [address]      Radiology Studies: No results found.  Scheduled Meds:  atorvastatin  20 mg Oral Daily   chlorhexidine  15 mL Mouth Rinse BID   Chlorhexidine Gluconate Cloth  6 each Topical Daily   diltiazem  120 mg Oral QPM   gabapentin  300 mg  Oral QHS   heparin injection (subcutaneous)  5,000 Units Subcutaneous Q8H   hydrALAZINE  50 mg Oral Q8H   levothyroxine  50 mcg Oral Daily   mouth rinse  15 mL Mouth Rinse q12n4p   nadolol  80 mg Oral Daily   pantoprazole  40 mg Oral Daily   senna-docusate  1 tablet Oral BID   sodium chloride flush  3 mL Intravenous Once   tamsulosin  0.4 mg Oral Daily   Continuous Infusions:  sodium chloride 75 mL/hr at 03/30/22 0825   cefTRIAXone (ROCEPHIN)  IV 1 g (03/29/22 1930)     LOS: 5 days   Shelly Coss, MD [facility] P5/20/2023, 10:48 AM

## 2022-03-31 DIAGNOSIS — I610 Nontraumatic intracerebral hemorrhage in hemisphere, subcortical: Secondary | ICD-10-CM | POA: Diagnosis not present

## 2022-03-31 LAB — BASIC METABOLIC PANEL
Anion gap: 5 (ref 5–15)
BUN: 25 mg/dL — ABNORMAL HIGH (ref 8–23)
CO2: 22 mmol/L (ref 22–32)
Calcium: 8.4 mg/dL — ABNORMAL LOW (ref 8.9–10.3)
Chloride: 110 mmol/L (ref 98–111)
Creatinine, Ser: 1.04 mg/dL — ABNORMAL HIGH (ref 0.44–1.00)
GFR, Estimated: 49 mL/min — ABNORMAL LOW (ref >60)
Glucose, Bld: 107 mg/dL — ABNORMAL HIGH (ref 70–99)
Potassium: 4 mmol/L (ref 3.5–5.1)
Sodium: 137 mmol/L (ref 135–145)

## 2022-03-31 LAB — URINE CULTURE: Culture: >=100000 — AB

## 2022-03-31 LAB — MAGNESIUM: Magnesium: 1.9 mg/dL (ref 1.7–2.4)

## 2022-03-31 LAB — GLUCOSE, CAPILLARY: Glucose-Capillary: 111 mg/dL — ABNORMAL HIGH (ref 70–99)

## 2022-03-31 MED ORDER — CEPHALEXIN 500 MG PO CAPS
500.0000 mg | ORAL_CAPSULE | Freq: Three times a day (TID) | ORAL | Status: DC
  Administered 2022-03-31 – 2022-04-02 (×7): 500 mg via ORAL
  Filled 2022-03-31 (×7): qty 1

## 2022-03-31 MED ORDER — PREDNISONE 1 MG PO TABS
2.0000 mg | ORAL_TABLET | Freq: Every day | ORAL | Status: DC
  Administered 2022-03-31 – 2022-04-02 (×3): 2 mg via ORAL
  Filled 2022-03-31 (×3): qty 2

## 2022-03-31 NOTE — Plan of Care (Signed)
  Problem: Coping: Goal: Will verbalize positive feelings about self Outcome: Progressing Goal: Will identify appropriate support needs Outcome: Progressing   Problem: Nutrition: Goal: Risk of aspiration will decrease Outcome: Progressing Goal: Dietary intake will improve Outcome: Progressing

## 2022-03-31 NOTE — Progress Notes (Signed)
PROGRESS NOTE  [name]  CBU:[id number] DOB: Feb 24, 1923 DOA: 03/25/2022 PCP: [name], MD   Brief Narrative:  Patient is a 88 year old female with history of DVT, hypertension, hypothyroidism, GERD, peripheral vascular disease, A-fib on Eliquis who presented with acute onset of right-sided weakness, aphasia and slurred speech.  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  After admission, her Eliquis was reversed with Andexxa.  Patient was also placed on Cleviprex for blood pressure control.  She was on neurology service, transferred to [facility] service on 5/17.  PT/OT evaluation done and recommended CIR.  Waiting for placement.     Assessment & Plan:  Principal Problem:   ICH (intracerebral hemorrhage) ([place]) Active Problems:   Hypertensive emergency   Intracerebral hemorrhage: Presented with acute onset of right-sided weakness, aphasia and slurred speech.  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  MRI did not show any significant vascular abnormality.  ICH was most likely in the setting of Eliquis and uncontrolled hypertension.  Carotid Doppler unremarkable.  2D echo showed 55 to 60% EF.  Hemoglobin A1c was 5.9.  LDL of 94.  Eliquis discontinued.  PT/OT recommended CIR on discharge.  Hypertension: Takes losartan, Cardizem, nadolol at home.  Was started on Cleviprex for blood pressure control.  Resumed home medications, added hydralazine.  Monitor blood pressure, currently stable  Hyperlipidemia: LDL of 94.  She does not take any medications at home.  Started on Lipitor 20 mg daily.  Goal of LDL less than 70.  Paroxysmal A-fib: Monitor on telemetry.  Takes Eliquis at home now discontinued due to intracerebral hemorrhage.  Eliquis was reversed with Andexxa.  On rate control with Cardizem, nadolol.  Neurology recommended resumption of anticoagulation after resolution of IPH, should be done as an outpatient.  Dysphagia: Speech therapy was following.  On  dysphagia 3 diet.  Peripheral vascular disease/history of DVT: Was taking Eliquis, now on hold.  AKI: Creatinine trended up y to 1.4,now almost back to baseline.  Started on gentle IV fluids with improvement,check BMP tomorrow  UTI/Leukocytosis:   Started on ceftriaxone.  Urine culture showed Proteus. On keflex  Urine retention: Foley placed,now removed.  Started on tamsulosin         DVT prophylaxis:heparin injection 5,000 Units Start: 03/26/22 1630 SCD's Start: 03/25/22 0248     Code Status: Full Code  Family Communication: Discussed with daughter on phone on 5/19  Patient status: Inpatient  Patient is from : Home  Anticipated discharge to: CIR  Estimated DC date: As soon as bed is available   Consultants: Neurology  Procedures: None  Antimicrobials:  Anti-infectives (From admission, onward)    Start     Dose/Rate Route Frequency Ordered Stop   03/29/22 1630  cefTRIAXone (ROCEPHIN) 1 g in sodium chloride 0.9 % 100 mL IVPB        1 g 200 mL/hr over 30 Minutes Intravenous Every 24 hours 03/29/22 1531         Subjective:  Patient seen and examined at the bedside this morning.  Hemodynamically stable.  She was about to be fed.  Looks comfortable, remains confused  Objective: Vitals:   03/30/22 1933 03/30/22 2340 03/31/22 0338 03/31/22 0748  BP: (!) 110/53 (!) 122/56 (!) 128/53 132/62  Pulse: 66 67 62 66  Resp: '16 18 18 14  '$ Temp: 98.6 F (37 C) 97.7 F (36.5 C) 97.9 F (36.6 C) 98.4 F (36.9 C)  TempSrc: Oral Oral  Oral  SpO2: 99% 97% 97% 97%  Weight:  Height:        Intake/Output Summary (Last 24 hours) at 03/31/2022 1034 Last data filed at 03/31/2022 4287 Gross per 24 hour  Intake 749.82 ml  Output 850 ml  Net -100.18 ml   Filed Weights   03/25/22 0200  Weight: 66 kg    Examination:  General exam: Overall comfortable, not in distress HEENT: PERRL Respiratory system:  no wheezes or crackles  Cardiovascular system: S1 & S2 heard,  RRR.  Gastrointestinal system: Abdomen is nondistended, soft and nontender. Central nervous system: awake but confused,right hemiparesis Extremities: No edema, no clubbing ,no cyanosis Skin: No rashes, no ulcers,no icterus    Data Reviewed: I have personally reviewed following labs and imaging studies  CBC: Recent Labs  Lab 03/25/22 0250 03/25/22 0255 03/26/22 0433 03/27/22 0239 03/28/22 0427 03/29/22 0259 03/30/22 0248  WBC 5.3  --  10.3 9.8 9.0 13.0* 10.7*  NEUTROABS 2.8  --   --   --   --   --   --   HGB 9.4*   < > 11.4* 11.6* 10.8* 10.5* 10.0*  HCT 28.7*   < > 36.6 36.1 34.3* 32.4* 30.7*  MCV 91.1  --  90.8 90.5 90.5 89.5 89.0  PLT 208  --  272 264 257 251 233   < > = values in this interval not displayed.   Basic Metabolic Panel: Recent Labs  Lab 03/26/22 0433 03/27/22 0239 03/28/22 0427 03/29/22 0259 03/30/22 0248  NA 139 140 140 139 140  K 3.6 4.3 3.5 3.6 3.7  CL 107 110 112* 108 111  CO2 '26 24 23 22 '$ 21*  GLUCOSE 102* 107* 97 94 109*  BUN '11 13 22 '$ 33* 32*  CREATININE 0.77 0.80 1.01* 1.40* 1.20*  CALCIUM 8.9 9.2 9.2 9.0 8.8*     Recent Results (from the past 240 hour(s))  MRSA Next Gen by PCR, Nasal     Status: None   Collection Time: 03/25/22  4:11 AM   Specimen: Nasal Mucosa; Nasal Swab  Result Value Ref Range Status   MRSA by PCR Next Gen NOT DETECTED NOT DETECTED Final    Comment: (NOTE) The GeneXpert MRSA Assay (FDA approved for NASAL specimens only), is one component of a comprehensive MRSA colonization surveillance program. It is not intended to diagnose MRSA infection nor to guide or monitor treatment for MRSA infections. Test performance is not FDA approved in patients less than 60 years old. Performed at [facility], [place] [address], [place], [place] [address]   Urine Culture     Status: Abnormal   Collection Time: 03/29/22  3:50 PM   Specimen: Urine, Catheterized  Result Value Ref Range Status   Specimen Description URINE,  CATHETERIZED  Final   Special Requests   Final    NONE Performed at [facility], [address], [place], [place] [address]    Culture >=100,000 COLONIES/mL PROTEUS MIRABILIS (A)  Final   Report Status 03/31/2022 FINAL  Final   Organism ID, Bacteria PROTEUS MIRABILIS (A)  Final      Susceptibility   Proteus mirabilis - MIC*    AMPICILLIN <=2 SENSITIVE Sensitive     CEFAZOLIN <=4 SENSITIVE Sensitive     CEFEPIME <=0.12 SENSITIVE Sensitive     CEFTRIAXONE <=0.25 SENSITIVE Sensitive     CIPROFLOXACIN <=0.25 SENSITIVE Sensitive     GENTAMICIN <=1 SENSITIVE Sensitive     IMIPENEM 2 SENSITIVE Sensitive     NITROFURANTOIN RESISTANT Resistant  TRIMETH/SULFA <=20 SENSITIVE Sensitive     AMPICILLIN/SULBACTAM <=2 SENSITIVE Sensitive     PIP/TAZO <=4 SENSITIVE Sensitive     * >=100,000 COLONIES/mL PROTEUS MIRABILIS     Radiology Studies: No results found.  Scheduled Meds:  atorvastatin  20 mg Oral Daily   chlorhexidine  15 mL Mouth Rinse BID   Chlorhexidine Gluconate Cloth  6 each Topical Daily   diltiazem  120 mg Oral QPM   gabapentin  300 mg Oral QHS   heparin injection (subcutaneous)  5,000 Units Subcutaneous Q8H   hydrALAZINE  50 mg Oral Q8H   levothyroxine  50 mcg Oral Daily   mouth rinse  15 mL Mouth Rinse q12n4p   nadolol  80 mg Oral Daily   pantoprazole  40 mg Oral Daily   senna-docusate  1 tablet Oral BID   sodium chloride flush  3 mL Intravenous Once   tamsulosin  0.4 mg Oral Daily   Continuous Infusions:  sodium chloride 75 mL/hr at 03/30/22 0825   cefTRIAXone (ROCEPHIN)  IV 1 g (03/30/22 1544)     LOS: 6 days   Shelly Coss, MD [facility] P5/21/2023, 10:34 AM

## 2022-03-31 NOTE — Progress Notes (Addendum)
Family concerned that patient remains sleepy through the evening; no appetite; sleeping more; patient awakens to voice; follows simple commands; CBG 111; denies pain, shortness of breath; easily fatigued during assessment; vital signs repeated; see assessment; report given to night RN; continue to monitor. 0xygen was applied earlier by [facility]; sats are currently 97-98 on 1l.  Pulled patient up in bed; elevated HOB; encouraged patient to cough and deep breath.

## 2022-04-01 DIAGNOSIS — I610 Nontraumatic intracerebral hemorrhage in hemisphere, subcortical: Secondary | ICD-10-CM | POA: Diagnosis not present

## 2022-04-01 MED ORDER — OXYCODONE HCL 5 MG PO TABS
5.0000 mg | ORAL_TABLET | Freq: Once | ORAL | Status: AC
  Administered 2022-04-01: 5 mg via ORAL
  Filled 2022-04-01: qty 1

## 2022-04-01 NOTE — Progress Notes (Signed)
PROGRESS NOTE  [name]  KXF:[id number] DOB: Jan 05, 1923 DOA: 03/25/2022 PCP: [name], MD   Brief Narrative:  Patient is a 88 year old female with history of DVT, hypertension, hypothyroidism, GERD, peripheral vascular disease, A-fib on Eliquis who presented with acute onset of right-sided weakness, aphasia and slurred speech.  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  After admission, her Eliquis was reversed with Andexxa.  Patient was also placed on Cleviprex for blood pressure control.  She was on neurology service, transferred to [facility] service on 5/17.  PT/OT evaluation done and recommended CIR.  Waiting for placement.     Assessment & Plan:  Principal Problem:   ICH (intracerebral hemorrhage) ([place]) Active Problems:   Hypertensive emergency   Intracerebral hemorrhage: Presented with acute onset of right-sided weakness, aphasia and slurred speech.  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  MRI did not show any significant vascular abnormality.  ICH was most likely in the setting of Eliquis and uncontrolled hypertension.  Carotid Doppler unremarkable.  2D echo showed 55 to 60% EF.  Hemoglobin A1c was 5.9.  LDL of 94.  Eliquis discontinued.  PT/OT recommended CIR on discharge.  Hypertension: Takes losartan, Cardizem, nadolol at home.  Was started on Cleviprex for blood pressure control.  Resumed home medications, added hydralazine.  Monitor blood pressure, currently stable  Hyperlipidemia: LDL of 94.  She does not take any medications at home.  Started on Lipitor 20 mg daily.  Goal of LDL less than 70.  Paroxysmal A-fib: Monitor on telemetry.  Takes Eliquis at home now discontinued due to intracerebral hemorrhage.  Eliquis was reversed with Andexxa.  On rate control with Cardizem, nadolol.  Neurology recommended resumption of anticoagulation after resolution of IPH, should be done as an outpatient.  Dysphagia: Speech therapy was following.  On  dysphagia 3 diet.  Peripheral vascular disease/history of DVT: Was taking Eliquis, now on hold.  AKI: Creatinine trended up y to 1.4,now almost back to baseline.  Started on gentle IV fluids with improvement,check BMP tomorrow  UTI/Leukocytosis:   Started on ceftriaxone.  Urine culture showed Proteus. On keflex  Urine retention: Foley placed,removed but had to be reinserted.  Started on tamsulosin.Follow up with [facility] as an outpatient in a week after discharge         DVT prophylaxis:heparin injection 5,000 Units Start: 03/26/22 1630 SCD's Start: 03/25/22 0248     Code Status: Full Code  Family Communication: Discussed with son at bedside Patient status: Inpatient  Patient is from : Home  Anticipated discharge to: CIR  Estimated DC date: As soon as bed is available   Consultants: Neurology  Procedures: None  Antimicrobials:  Anti-infectives (From admission, onward)    Start     Dose/Rate Route Frequency Ordered Stop   03/31/22 1400  cephALEXin (KEFLEX) capsule 500 mg        500 mg Oral Every 8 hours 03/31/22 1035 04/03/22 1359   03/29/22 1630  cefTRIAXone (ROCEPHIN) 1 g in sodium chloride 0.9 % 100 mL IVPB  Status:  Discontinued        1 g 200 mL/hr over 30 Minutes Intravenous Every 24 hours 03/29/22 1531 03/31/22 1035       Subjective:  Patient seen and examined at the bedside this morning.  Hemodynamically stable.  Much more alert, communicative today.  Son at bedside  Objective: Vitals:   03/31/22 2134 04/01/22 0110 04/01/22 0512 04/01/22 0805  BP: (!) 115/49 (!) 130/59 136/63 121/61  Pulse: (!) 56 64  65 68  Resp:   16 18  Temp:   98.1 F (36.7 C) 98.6 F (37 C)  TempSrc:   Oral Oral  SpO2:   99% 97%  Weight:      Height:        Intake/Output Summary (Last 24 hours) at 04/01/2022 0956 Last data filed at 04/01/2022 0500 Gross per 24 hour  Intake 120 ml  Output 1950 ml  Net -1830 ml   Filed Weights   03/25/22 0200  Weight: 66 kg     Examination:  General exam: Overall comfortable, not in distress HEENT: PERRL Respiratory system:  no wheezes or crackles  Cardiovascular system: S1 & S2 heard, RRR.  Gastrointestinal system: Abdomen is nondistended, soft and nontender. Central nervous system: Alert and awake, not oriented to time, right hemiparesis Extremities: No edema, no clubbing ,no cyanosis Skin: No rashes, no ulcers,no icterus   GU: Foley  Data Reviewed: I have personally reviewed following labs and imaging studies  CBC: Recent Labs  Lab 03/26/22 0433 03/27/22 0239 03/28/22 0427 03/29/22 0259 03/30/22 0248  WBC 10.3 9.8 9.0 13.0* 10.7*  HGB 11.4* 11.6* 10.8* 10.5* 10.0*  HCT 36.6 36.1 34.3* 32.4* 30.7*  MCV 90.8 90.5 90.5 89.5 89.0  PLT 272 264 257 251 825   Basic Metabolic Panel: Recent Labs  Lab 03/27/22 0239 03/28/22 0427 03/29/22 0259 03/30/22 0248 03/31/22 1142  NA 140 140 139 140 137  K 4.3 3.5 3.6 3.7 4.0  CL 110 112* 108 111 110  CO2 '24 23 22 '$ 21* 22  GLUCOSE 107* 97 94 109* 107*  BUN 13 22 33* 32* 25*  CREATININE 0.80 1.01* 1.40* 1.20* 1.04*  CALCIUM 9.2 9.2 9.0 8.8* 8.4*  MG  --   --   --   --  1.9     Recent Results (from the past 240 hour(s))  MRSA Next Gen by PCR, Nasal     Status: None   Collection Time: 03/25/22  4:11 AM   Specimen: Nasal Mucosa; Nasal Swab  Result Value Ref Range Status   MRSA by PCR Next Gen NOT DETECTED NOT DETECTED Final    Comment: (NOTE) The GeneXpert MRSA Assay (FDA approved for NASAL specimens only), is one component of a comprehensive MRSA colonization surveillance program. It is not intended to diagnose MRSA infection nor to guide or monitor treatment for MRSA infections. Test performance is not FDA approved in patients less than 62 years old. Performed at [facility], [place] [address] [place], [place] [address]   Urine Culture     Status: Abnormal   Collection Time: 03/29/22  3:50 PM   Specimen: Urine, Catheterized  Result  Value Ref Range Status   Specimen Description URINE, CATHETERIZED  Final   Special Requests   Final    NONE Performed at [facility], [address] [place], [place] [address]    Culture >=100,000 COLONIES/mL PROTEUS MIRABILIS (A)  Final   Report Status 03/31/2022 FINAL  Final   Organism ID, Bacteria PROTEUS MIRABILIS (A)  Final      Susceptibility   Proteus mirabilis - MIC*    AMPICILLIN <=2 SENSITIVE Sensitive     CEFAZOLIN <=4 SENSITIVE Sensitive     CEFEPIME <=0.12 SENSITIVE Sensitive     CEFTRIAXONE <=0.25 SENSITIVE Sensitive     CIPROFLOXACIN <=0.25 SENSITIVE Sensitive     GENTAMICIN <=1 SENSITIVE Sensitive     IMIPENEM 2 SENSITIVE Sensitive     NITROFURANTOIN RESISTANT Resistant  TRIMETH/SULFA <=20 SENSITIVE Sensitive     AMPICILLIN/SULBACTAM <=2 SENSITIVE Sensitive     PIP/TAZO <=4 SENSITIVE Sensitive     * >=100,000 COLONIES/mL PROTEUS MIRABILIS     Radiology Studies: No results found.  Scheduled Meds:  atorvastatin  20 mg Oral Daily   cephALEXin  500 mg Oral Q8H   chlorhexidine  15 mL Mouth Rinse BID   Chlorhexidine Gluconate Cloth  6 each Topical Daily   diltiazem  120 mg Oral QPM   gabapentin  300 mg Oral QHS   heparin injection (subcutaneous)  5,000 Units Subcutaneous Q8H   hydrALAZINE  50 mg Oral Q8H   levothyroxine  50 mcg Oral Daily   mouth rinse  15 mL Mouth Rinse q12n4p   nadolol  80 mg Oral Daily   pantoprazole  40 mg Oral Daily   predniSONE  2 mg Oral Q lunch   senna-docusate  1 tablet Oral BID   sodium chloride flush  3 mL Intravenous Once   tamsulosin  0.4 mg Oral Daily   Continuous Infusions:     LOS: 7 days   Shelly Coss, MD [facility] P5/22/2023, 9:56 AM

## 2022-04-01 NOTE — Progress Notes (Addendum)
Inpatient Rehab Admissions Coordinator:   Continue to await determination from [facility].   1330: provided Dr. Earmon Phoenix information to [facility] case manager for potential p2p.  Case being sent to MD at [facility] for review.   Shann Medal, PT, DPT Admissions Coordinator [phone] 04/01/22  10:56 AM

## 2022-04-01 NOTE — Plan of Care (Signed)
  Problem: Coping: Goal: Will verbalize positive feelings about self Outcome: Progressing Goal: Will identify appropriate support needs Outcome: Progressing   Problem: Self-Care: Goal: Verbalization of feelings and concerns over difficulty with self-care will improve Outcome: Progressing   Problem: Nutrition: Goal: Risk of aspiration will decrease Outcome: Progressing

## 2022-04-01 NOTE — Progress Notes (Signed)
Physical Therapy Treatment Patient Details Name: [name] MRN: [id number] DOB: 04-22-1923 Today's Date: 04/01/2022   History of Present Illness Pt is 88 yo female who presents with R sided weakness, aphasia, and slurred speech. CT head showed L thalamic IPH.  PMH: DVT, HTN, hypothyoroidism, GERD, PVD, Afib.    PT Comments    Pt more alert and not confused. Pt remains to have flat affect with delayed response time and R sided weakness however is also beginning to initiate movement of R UE and LE as well. Focused on sit to stand today. Pt continues to require assist anteriorly to power up vs trying to stand up to RW. Pt with impaired motor planning and sequencing in addition to R hemiparesis requiring maxA for OOB mobility. Pt to benefit from AIR upon d/c for maximal functional recovery.    Recommendations for follow up therapy are one component of a multi-disciplinary discharge planning process, led by the attending physician.  Recommendations may be updated based on patient status, additional functional criteria and insurance authorization.  Follow Up Recommendations  Acute inpatient rehab (3hours/day)     Assistance Recommended at Discharge Frequent or constant Supervision/Assistance  Patient can return home with the following Two people to help with walking and/or transfers;Two people to help with bathing/dressing/bathroom;Assistance with cooking/housework;Assistance with feeding;Direct supervision/assist for medications management;Direct supervision/assist for financial management;Assist for transportation;Help with stairs or ramp for entrance   Equipment Recommendations  Wheelchair (measurements PT);Wheelchair cushion (measurements PT)    Recommendations for Other Services Rehab consult     Precautions / Restrictions Precautions Precautions: Fall Restrictions Weight Bearing Restrictions: No     Mobility  Bed Mobility Overal bed mobility: Needs Assistance Bed Mobility:  Supine to Sit     Supine to sit: Mod assist, HOB elevated     General bed mobility comments: pt able to bring LEs over towards EOB with directional verbal cues and minA for R LE, modA for trunk elevation to EOB    Transfers Overall transfer level: Needs assistance Equipment used: Rolling walker (2 wheels) (trialed RW and face to face transfer) Transfers: Bed to chair/wheelchair/BSC, Sit to/from Stand Sit to Stand: Mod assist, +2 physical assistance   Step pivot transfers: Max assist, +2 physical assistance       General transfer comment: attempted to stand up to RW however pt unable to grip with R hand and power up with R LE without assist anteriorly from PT, pt then completed 2 sit to stand with face to face support and did much better, R knee blocked and maxA to adavance during step pvt transfer, Pt holding L LE out in kick stand position requiring max verbal and tactile cues to sequence stepping    Ambulation/Gait               General Gait Details: unable   Stairs             Wheelchair Mobility    Modified Rankin (Stroke Patients Only) Modified Rankin (Stroke Patients Only) Pre-Morbid Rankin Score: No symptoms Modified Rankin: Severe disability     Balance Overall balance assessment: Needs assistance Sitting-balance support: Single extremity supported, Feet supported Sitting balance-Leahy Scale: Poor Sitting balance - Comments: pt with improved sitting balance this date, able to maintain midline with verbal cues, close min guard Postural control: Right lateral lean Standing balance support: Single extremity supported Standing balance-Leahy Scale: Zero Standing balance comment: dependent on PT and tech  Cognition Arousal/Alertness: Awake/alert Behavior During Therapy: Flat affect Overall Cognitive Status: Impaired/Different from baseline Area of Impairment: Attention, Following commands, Safety/judgement,  Awareness, Problem solving                   Current Attention Level: Sustained   Following Commands: Follows one step commands with increased time Safety/Judgement: Decreased awareness of safety, Decreased awareness of deficits (R inattention) Awareness: Intellectual Problem Solving: Slow processing, Requires verbal cues, Difficulty sequencing, Requires tactile cues General Comments: pt A&Ox3, flat affect but responds to questions appropriately and accurately, pt did perk up when a vistor came to bring flowers        Exercises      General Comments General comments (skin integrity, edema, etc.): VSS, mild swelling in R UE      Pertinent Vitals/Pain Pain Assessment Pain Assessment: Faces Faces Pain Scale: Hurts little more Pain Location: R hand Pain Descriptors / Indicators: Grimacing    Home Living                          Prior Function            PT Goals (current goals can now be found in the care plan section) Acute Rehab PT Goals Patient Stated Goal: none stated PT Goal Formulation: With patient/family Time For Goal Achievement: 04/09/22 Potential to Achieve Goals: Fair Progress towards PT goals: Progressing toward goals    Frequency    Min 4X/week      PT Plan Current plan remains appropriate    Co-evaluation              AM-PAC PT "6 Clicks" Mobility   Outcome Measure  Help needed turning from your back to your side while in a flat bed without using bedrails?: A Lot Help needed moving from lying on your back to sitting on the side of a flat bed without using bedrails?: A Lot Help needed moving to and from a bed to a chair (including a wheelchair)?: Total Help needed standing up from a chair using your arms (e.g., wheelchair or bedside chair)?: Total Help needed to walk in hospital room?: Total Help needed climbing 3-5 steps with a railing? : Total 6 Click Score: 8    End of Session Equipment Utilized During Treatment:  Gait belt Activity Tolerance: Patient tolerated treatment well Patient left: with call bell/phone within reach;in chair;with chair alarm set Nurse Communication: Mobility status;Need for lift equipment PT Visit Diagnosis: Hemiplegia and hemiparesis;Difficulty in walking, not elsewhere classified (R26.2) Hemiplegia - Right/Left: Right Hemiplegia - dominant/non-dominant: Dominant Hemiplegia - caused by: Nontraumatic SAH     Time: 5621-3086 PT Time Calculation (min) (ACUTE ONLY): 27 min  Charges:  $Gait Training: 8-22 mins $Therapeutic Activity: 8-22 mins                     Kittie Plater, PT, DPT Acute Rehabilitation Services Secure chat preferred Office #: [phone]    Berline Lopes 04/01/2022, 2:04 PM

## 2022-04-02 ENCOUNTER — Inpatient Hospital Stay (HOSPITAL_COMMUNITY): Payer: Medicare PPO

## 2022-04-02 ENCOUNTER — Other Ambulatory Visit: Payer: Self-pay

## 2022-04-02 ENCOUNTER — Encounter (HOSPITAL_COMMUNITY): Payer: Self-pay | Admitting: Physical Medicine & Rehabilitation

## 2022-04-02 ENCOUNTER — Inpatient Hospital Stay (HOSPITAL_COMMUNITY)
Admission: RE | Admit: 2022-04-02 | Discharge: 2022-04-30 | DRG: 056 | Disposition: A | Discharge status: Skilled Nursing Facility | Payer: Medicare PPO | Source: Intra-hospital | Attending: Physical Medicine & Rehabilitation | Admitting: Physical Medicine & Rehabilitation

## 2022-04-02 DIAGNOSIS — G629 Polyneuropathy, unspecified: Secondary | ICD-10-CM | POA: Diagnosis present

## 2022-04-02 DIAGNOSIS — I951 Orthostatic hypotension: Secondary | ICD-10-CM | POA: Diagnosis not present

## 2022-04-02 DIAGNOSIS — I10 Essential (primary) hypertension: Secondary | ICD-10-CM | POA: Diagnosis not present

## 2022-04-02 DIAGNOSIS — Z7901 Long term (current) use of anticoagulants: Secondary | ICD-10-CM

## 2022-04-02 DIAGNOSIS — H3530 Unspecified macular degeneration: Secondary | ICD-10-CM | POA: Diagnosis present

## 2022-04-02 DIAGNOSIS — A499 Bacterial infection, unspecified: Secondary | ICD-10-CM

## 2022-04-02 DIAGNOSIS — I619 Nontraumatic intracerebral hemorrhage, unspecified: Secondary | ICD-10-CM | POA: Diagnosis not present

## 2022-04-02 DIAGNOSIS — M79602 Pain in left arm: Secondary | ICD-10-CM | POA: Diagnosis not present

## 2022-04-02 DIAGNOSIS — I959 Hypotension, unspecified: Secondary | ICD-10-CM | POA: Diagnosis not present

## 2022-04-02 DIAGNOSIS — F419 Anxiety disorder, unspecified: Secondary | ICD-10-CM | POA: Diagnosis present

## 2022-04-02 DIAGNOSIS — Z79890 Hormone replacement therapy (postmenopausal): Secondary | ICD-10-CM

## 2022-04-02 DIAGNOSIS — I480 Paroxysmal atrial fibrillation: Secondary | ICD-10-CM | POA: Diagnosis not present

## 2022-04-02 DIAGNOSIS — I69251 Hemiplegia and hemiparesis following other nontraumatic intracranial hemorrhage affecting right dominant side: Principal | ICD-10-CM

## 2022-04-02 DIAGNOSIS — N390 Urinary tract infection, site not specified: Secondary | ICD-10-CM | POA: Diagnosis present

## 2022-04-02 DIAGNOSIS — G9341 Metabolic encephalopathy: Secondary | ICD-10-CM | POA: Diagnosis not present

## 2022-04-02 DIAGNOSIS — F515 Nightmare disorder: Secondary | ICD-10-CM | POA: Diagnosis not present

## 2022-04-02 DIAGNOSIS — Z86718 Personal history of other venous thrombosis and embolism: Secondary | ICD-10-CM

## 2022-04-02 DIAGNOSIS — K219 Gastro-esophageal reflux disease without esophagitis: Secondary | ICD-10-CM

## 2022-04-02 DIAGNOSIS — Z85820 Personal history of malignant melanoma of skin: Secondary | ICD-10-CM

## 2022-04-02 DIAGNOSIS — R1310 Dysphagia, unspecified: Secondary | ICD-10-CM | POA: Diagnosis present

## 2022-04-02 DIAGNOSIS — R0602 Shortness of breath: Secondary | ICD-10-CM | POA: Diagnosis not present

## 2022-04-02 DIAGNOSIS — M353 Polymyalgia rheumatica: Secondary | ICD-10-CM | POA: Diagnosis not present

## 2022-04-02 DIAGNOSIS — N179 Acute kidney failure, unspecified: Secondary | ICD-10-CM | POA: Diagnosis present

## 2022-04-02 DIAGNOSIS — Z7401 Bed confinement status: Secondary | ICD-10-CM | POA: Diagnosis not present

## 2022-04-02 DIAGNOSIS — I69291 Dysphagia following other nontraumatic intracranial hemorrhage: Secondary | ICD-10-CM

## 2022-04-02 DIAGNOSIS — I69320 Aphasia following cerebral infarction: Secondary | ICD-10-CM

## 2022-04-02 DIAGNOSIS — I5033 Acute on chronic diastolic (congestive) heart failure: Secondary | ICD-10-CM | POA: Diagnosis not present

## 2022-04-02 DIAGNOSIS — I610 Nontraumatic intracerebral hemorrhage in hemisphere, subcortical: Secondary | ICD-10-CM | POA: Diagnosis not present

## 2022-04-02 DIAGNOSIS — Z881 Allergy status to other antibiotic agents status: Secondary | ICD-10-CM

## 2022-04-02 DIAGNOSIS — D649 Anemia, unspecified: Secondary | ICD-10-CM | POA: Diagnosis not present

## 2022-04-02 DIAGNOSIS — D638 Anemia in other chronic diseases classified elsewhere: Secondary | ICD-10-CM | POA: Diagnosis not present

## 2022-04-02 DIAGNOSIS — I4891 Unspecified atrial fibrillation: Secondary | ICD-10-CM | POA: Diagnosis not present

## 2022-04-02 DIAGNOSIS — I130 Hypertensive heart and chronic kidney disease with heart failure and stage 1 through stage 4 chronic kidney disease, or unspecified chronic kidney disease: Secondary | ICD-10-CM | POA: Diagnosis not present

## 2022-04-02 DIAGNOSIS — G459 Transient cerebral ischemic attack, unspecified: Secondary | ICD-10-CM | POA: Diagnosis not present

## 2022-04-02 DIAGNOSIS — Z96642 Presence of left artificial hip joint: Secondary | ICD-10-CM | POA: Diagnosis present

## 2022-04-02 DIAGNOSIS — N189 Chronic kidney disease, unspecified: Secondary | ICD-10-CM | POA: Diagnosis not present

## 2022-04-02 DIAGNOSIS — Z803 Family history of malignant neoplasm of breast: Secondary | ICD-10-CM

## 2022-04-02 DIAGNOSIS — B9689 Other specified bacterial agents as the cause of diseases classified elsewhere: Secondary | ICD-10-CM | POA: Diagnosis present

## 2022-04-02 DIAGNOSIS — I69290 Apraxia following other nontraumatic intracranial hemorrhage: Secondary | ICD-10-CM

## 2022-04-02 DIAGNOSIS — E039 Hypothyroidism, unspecified: Secondary | ICD-10-CM | POA: Diagnosis present

## 2022-04-02 DIAGNOSIS — N1832 Chronic kidney disease, stage 3b: Secondary | ICD-10-CM | POA: Diagnosis present

## 2022-04-02 DIAGNOSIS — I4819 Other persistent atrial fibrillation: Secondary | ICD-10-CM

## 2022-04-02 DIAGNOSIS — Z825 Family history of asthma and other chronic lower respiratory diseases: Secondary | ICD-10-CM

## 2022-04-02 DIAGNOSIS — R339 Retention of urine, unspecified: Secondary | ICD-10-CM | POA: Diagnosis present

## 2022-04-02 DIAGNOSIS — M25512 Pain in left shoulder: Secondary | ICD-10-CM | POA: Diagnosis not present

## 2022-04-02 DIAGNOSIS — I69298 Other sequelae of other nontraumatic intracranial hemorrhage: Secondary | ICD-10-CM | POA: Diagnosis not present

## 2022-04-02 DIAGNOSIS — Z885 Allergy status to narcotic agent status: Secondary | ICD-10-CM

## 2022-04-02 DIAGNOSIS — R2689 Other abnormalities of gait and mobility: Secondary | ICD-10-CM | POA: Diagnosis present

## 2022-04-02 DIAGNOSIS — E46 Unspecified protein-calorie malnutrition: Secondary | ICD-10-CM | POA: Diagnosis not present

## 2022-04-02 DIAGNOSIS — I69219 Unspecified symptoms and signs involving cognitive functions following other nontraumatic intracranial hemorrhage: Secondary | ICD-10-CM | POA: Diagnosis not present

## 2022-04-02 DIAGNOSIS — M19012 Primary osteoarthritis, left shoulder: Secondary | ICD-10-CM | POA: Diagnosis present

## 2022-04-02 DIAGNOSIS — I482 Chronic atrial fibrillation, unspecified: Secondary | ICD-10-CM | POA: Diagnosis not present

## 2022-04-02 DIAGNOSIS — I69391 Dysphagia following cerebral infarction: Secondary | ICD-10-CM | POA: Diagnosis not present

## 2022-04-02 DIAGNOSIS — E1151 Type 2 diabetes mellitus with diabetic peripheral angiopathy without gangrene: Secondary | ICD-10-CM | POA: Diagnosis present

## 2022-04-02 DIAGNOSIS — E1122 Type 2 diabetes mellitus with diabetic chronic kidney disease: Secondary | ICD-10-CM | POA: Diagnosis present

## 2022-04-02 DIAGNOSIS — S0093XA Contusion of unspecified part of head, initial encounter: Secondary | ICD-10-CM | POA: Diagnosis not present

## 2022-04-02 DIAGNOSIS — R410 Disorientation, unspecified: Secondary | ICD-10-CM | POA: Diagnosis not present

## 2022-04-02 DIAGNOSIS — I69191 Dysphagia following nontraumatic intracerebral hemorrhage: Secondary | ICD-10-CM | POA: Diagnosis not present

## 2022-04-02 DIAGNOSIS — I69220 Aphasia following other nontraumatic intracranial hemorrhage: Secondary | ICD-10-CM | POA: Diagnosis not present

## 2022-04-02 DIAGNOSIS — M810 Age-related osteoporosis without current pathological fracture: Secondary | ICD-10-CM | POA: Diagnosis not present

## 2022-04-02 DIAGNOSIS — I358 Other nonrheumatic aortic valve disorders: Secondary | ICD-10-CM | POA: Diagnosis not present

## 2022-04-02 DIAGNOSIS — R29818 Other symptoms and signs involving the nervous system: Secondary | ICD-10-CM | POA: Diagnosis not present

## 2022-04-02 DIAGNOSIS — I5030 Unspecified diastolic (congestive) heart failure: Secondary | ICD-10-CM | POA: Diagnosis not present

## 2022-04-02 DIAGNOSIS — Z7952 Long term (current) use of systemic steroids: Secondary | ICD-10-CM

## 2022-04-02 DIAGNOSIS — R531 Weakness: Secondary | ICD-10-CM | POA: Diagnosis not present

## 2022-04-02 DIAGNOSIS — E785 Hyperlipidemia, unspecified: Secondary | ICD-10-CM | POA: Diagnosis present

## 2022-04-02 DIAGNOSIS — R0902 Hypoxemia: Secondary | ICD-10-CM | POA: Diagnosis not present

## 2022-04-02 LAB — GLUCOSE, CAPILLARY: Glucose-Capillary: 142 mg/dL — ABNORMAL HIGH (ref 70–99)

## 2022-04-02 LAB — TROPONIN I (HIGH SENSITIVITY)
Troponin I (High Sensitivity): 13 ng/L (ref <18)
Troponin I (High Sensitivity): 11 ng/L (ref <18)
Troponin I (High Sensitivity): 9 ng/L (ref <18)

## 2022-04-02 MED ORDER — FLEET ENEMA 7-19 GM/118ML RE ENEM: 1.0000 | ENEMA | Freq: Once | RECTAL | Status: DC | PRN

## 2022-04-02 MED ORDER — ALPRAZOLAM 0.25 MG PO TABS: 0.2500 mg | ORAL_TABLET | Freq: Three times a day (TID) | ORAL | Status: DC | PRN

## 2022-04-02 MED ORDER — GUAIFENESIN-DM 100-10 MG/5ML PO SYRP: 5.0000 mL | ORAL_SOLUTION | Freq: Four times a day (QID) | ORAL | Status: DC | PRN

## 2022-04-02 MED ORDER — TAMSULOSIN HCL 0.4 MG PO CAPS
0.4000 mg | ORAL_CAPSULE | Freq: Every day | ORAL | Status: DC
  Administered 2022-04-03 – 2022-04-30 (×28): 0.4 mg via ORAL
  Filled 2022-04-02 (×28): qty 1

## 2022-04-02 MED ORDER — CEPHALEXIN 250 MG PO CAPS: 500.0000 mg | ORAL_CAPSULE | Freq: Three times a day (TID) | ORAL | Status: DC

## 2022-04-02 MED ORDER — DILTIAZEM HCL ER COATED BEADS 120 MG PO CP24
120.0000 mg | ORAL_CAPSULE | Freq: Every evening | ORAL | Status: DC
  Administered 2022-04-02 – 2022-04-29 (×28): 120 mg via ORAL
  Filled 2022-04-02 (×30): qty 1

## 2022-04-02 MED ORDER — NITROGLYCERIN 0.4 MG SL SUBL
0.4000 mg | SUBLINGUAL_TABLET | SUBLINGUAL | Status: DC | PRN
  Administered 2022-04-02 – 2022-04-04 (×2): 0.4 mg via SUBLINGUAL
  Filled 2022-04-02: qty 1

## 2022-04-02 MED ORDER — LEVOTHYROXINE SODIUM 50 MCG PO TABS
50.0000 ug | ORAL_TABLET | Freq: Every day | ORAL | Status: DC
  Administered 2022-04-03 – 2022-04-30 (×28): 50 ug via ORAL
  Filled 2022-04-02 (×28): qty 1

## 2022-04-02 MED ORDER — BISACODYL 5 MG PO TBEC
5.0000 mg | DELAYED_RELEASE_TABLET | Freq: Every day | ORAL | Status: DC | PRN
  Administered 2022-04-08 – 2022-04-15 (×2): 5 mg via ORAL
  Filled 2022-04-02 (×2): qty 1

## 2022-04-02 MED ORDER — ATORVASTATIN CALCIUM 10 MG PO TABS
20.0000 mg | ORAL_TABLET | Freq: Every day | ORAL | Status: DC
  Administered 2022-04-03 – 2022-04-30 (×28): 20 mg via ORAL
  Filled 2022-04-02 (×28): qty 2

## 2022-04-02 MED ORDER — FOSFOMYCIN TROMETHAMINE 3 G PO PACK
3.0000 g | PACK | Freq: Once | ORAL | Status: DC
  Filled 2022-04-02: qty 3

## 2022-04-02 MED ORDER — NITROGLYCERIN 0.4 MG SL SUBL
0.4000 mg | SUBLINGUAL_TABLET | SUBLINGUAL | Status: DC | PRN
  Administered 2022-04-02: 0.4 mg via SUBLINGUAL
  Filled 2022-04-02: qty 1

## 2022-04-02 MED ORDER — TAMSULOSIN HCL 0.4 MG PO CAPS: 0.4000 mg | ORAL_CAPSULE | Freq: Every day | ORAL | Status: DC

## 2022-04-02 MED ORDER — ACETAMINOPHEN 325 MG PO TABS
325.0000 mg | ORAL_TABLET | ORAL | Status: DC | PRN
  Administered 2022-04-08 – 2022-04-22 (×12): 650 mg via ORAL
  Administered 2022-04-24: 325 mg via ORAL
  Administered 2022-04-24: 650 mg via ORAL
  Administered 2022-04-30: 325 mg via ORAL
  Filled 2022-04-02 (×15): qty 2

## 2022-04-02 MED ORDER — TRAZODONE HCL 50 MG PO TABS
25.0000 mg | ORAL_TABLET | Freq: Every evening | ORAL | Status: DC | PRN
  Administered 2022-04-07: 50 mg via ORAL
  Filled 2022-04-02: qty 1

## 2022-04-02 MED ORDER — PREDNISONE 1 MG PO TABS
2.0000 mg | ORAL_TABLET | Freq: Every day | ORAL | Status: DC
  Administered 2022-04-03 – 2022-04-30 (×28): 2 mg via ORAL
  Filled 2022-04-02 (×28): qty 2

## 2022-04-02 MED ORDER — HYDRALAZINE HCL 50 MG PO TABS: 50.0000 mg | ORAL_TABLET | Freq: Three times a day (TID) | ORAL | Status: DC

## 2022-04-02 MED ORDER — SENNOSIDES-DOCUSATE SODIUM 8.6-50 MG PO TABS: 1.0000 | ORAL_TABLET | Freq: Two times a day (BID) | ORAL | Status: DC

## 2022-04-02 MED ORDER — CHLORHEXIDINE GLUCONATE CLOTH 2 % EX PADS
6.0000 | MEDICATED_PAD | Freq: Two times a day (BID) | CUTANEOUS | Status: DC
  Administered 2022-04-02 – 2022-04-05 (×6): 6 via TOPICAL

## 2022-04-02 MED ORDER — PANTOPRAZOLE SODIUM 40 MG PO TBEC
40.0000 mg | DELAYED_RELEASE_TABLET | Freq: Every day | ORAL | Status: DC
  Administered 2022-04-03 – 2022-04-30 (×28): 40 mg via ORAL
  Filled 2022-04-02 (×28): qty 1

## 2022-04-02 MED ORDER — HYDRALAZINE HCL 50 MG PO TABS
50.0000 mg | ORAL_TABLET | Freq: Three times a day (TID) | ORAL | Status: DC
  Administered 2022-04-02 – 2022-04-11 (×26): 50 mg via ORAL
  Filled 2022-04-02 (×26): qty 1

## 2022-04-02 MED ORDER — ATORVASTATIN CALCIUM 20 MG PO TABS: 20.0000 mg | ORAL_TABLET | Freq: Every day | ORAL | Status: DC

## 2022-04-02 MED ORDER — GABAPENTIN 300 MG PO CAPS
300.0000 mg | ORAL_CAPSULE | Freq: Every day | ORAL | Status: DC
  Administered 2022-04-02 – 2022-04-07 (×6): 300 mg via ORAL
  Filled 2022-04-02 (×6): qty 1

## 2022-04-02 MED ORDER — PROCHLORPERAZINE EDISYLATE 10 MG/2ML IJ SOLN: 5.0000 mg | Freq: Four times a day (QID) | INTRAMUSCULAR | Status: DC | PRN

## 2022-04-02 MED ORDER — HEPARIN SODIUM (PORCINE) 5000 UNIT/ML IJ SOLN
5000.0000 [IU] | Freq: Three times a day (TID) | INTRAMUSCULAR | Status: DC
  Administered 2022-04-02 – 2022-04-30 (×84): 5000 [IU] via SUBCUTANEOUS
  Filled 2022-04-02 (×84): qty 1

## 2022-04-02 MED ORDER — SENNOSIDES-DOCUSATE SODIUM 8.6-50 MG PO TABS: 1.0000 | ORAL_TABLET | Freq: Every evening | ORAL | Status: DC | PRN

## 2022-04-02 MED ORDER — PROCHLORPERAZINE 25 MG RE SUPP: 12.5000 mg | Freq: Four times a day (QID) | RECTAL | Status: DC | PRN

## 2022-04-02 MED ORDER — PROCHLORPERAZINE MALEATE 5 MG PO TABS: 5.0000 mg | ORAL_TABLET | Freq: Four times a day (QID) | ORAL | Status: DC | PRN

## 2022-04-02 MED ORDER — ALUM & MAG HYDROXIDE-SIMETH 200-200-20 MG/5ML PO SUSP
30.0000 mL | ORAL | Status: DC | PRN
  Administered 2022-04-04: 30 mL via ORAL
  Filled 2022-04-02 (×2): qty 30

## 2022-04-02 MED ORDER — NITROGLYCERIN 0.4 MG SL SUBL
SUBLINGUAL_TABLET | SUBLINGUAL | Status: AC
  Filled 2022-04-02: qty 1

## 2022-04-02 MED ORDER — NADOLOL 40 MG PO TABS
80.0000 mg | ORAL_TABLET | Freq: Every day | ORAL | Status: DC
Start: 2022-04-03 — End: 2022-04-30
  Administered 2022-04-03 – 2022-04-30 (×22): 80 mg via ORAL
  Filled 2022-04-02 (×28): qty 2

## 2022-04-02 MED ORDER — ALPRAZOLAM 0.25 MG PO TABS: 0.2500 mg | ORAL_TABLET | Freq: Every evening | ORAL | 0 refills | Status: DC | PRN

## 2022-04-02 MED ORDER — METHOCARBAMOL 500 MG PO TABS: 500.0000 mg | ORAL_TABLET | Freq: Four times a day (QID) | ORAL | Status: DC | PRN

## 2022-04-02 MED ORDER — PREDNISONE 1 MG PO TABS: 2.0000 mg | ORAL_TABLET | Freq: Every day | ORAL | Status: DC

## 2022-04-02 NOTE — Significant Event (Signed)
Rapid Response Event Note   Reason for Call :  Chest pressure  Initial Focused Assessment:  Pt lying in bed, alert. She appears anxious. She endorses tightness in her chest. She states the tightness is affecting her breathing. Lung sounds are clear in the upper lobes, diminished in the bases. Skin is warm, dry, pink. Peripheral pulses are 2+.   VS: T 98, BP 124/51, HR 67, RR 24, SpO2 96% on room air CBG: WDL  Interventions:  -CXR  -Orders received for PRN Nitroglycerin, STAT Troponin, and 12 lead EKG per provider  Plan of Care:  -Pt received 1 dose SL Nitro with mild improvement in her chest tightness -Pulse oximetry monitoring x 4 hours -Pt has hx of reaction to Keflex. Monitor for signs of allergic reaction. Medication discontinued.  Call rapid response for additional needs  Event Summary:  MD Notified: Zella Ball, NP Call Time: [place] Time: [place] Time: [place], RN

## 2022-04-02 NOTE — Plan of Care (Signed)
1545 Patient moved from chair to bed. MD confirmed patient to go to CIR with Foley. Patient recliner removed from room due to duplicate chair in room.

## 2022-04-02 NOTE — Progress Notes (Signed)
Patient ID: ELINORA WEIGAND, female   DOB: Feb 13, 1923, 88 y.o.   MRN: [id number] Pt arrived to 4M02 per bed. Family at bedside the patient/family educated on rehab and policies. Pt/family in agreement. Discussed pt being on keflex and pt c/o SOB and "chest pressure" after taking it, keflex allergy noted in chart. On call MD notified. No new orders at this time. Dinner tray given to patient, call light in reach, bed in lowest position.  Sheela Stack, LPN

## 2022-04-02 NOTE — Progress Notes (Signed)
1215 Patient given NTG. Patient sitting up in the chair. VS stable. Family at Bedside.    04/02/22 1200  Vitals  Temp (!) 97.4 F (36.3 C)  Temp Source Axillary  BP 100/65  MAP (mmHg) 68  BP Location Left Arm  BP Method Automatic  Patient Position (if appropriate) Sitting  Pulse Rate 68  Pulse Rate Source Dinamap  Resp 20  MEWS COLOR  MEWS Score Color Green  MEWS Score  MEWS Temp 0  MEWS Systolic 1  MEWS Pulse 0  MEWS RR 0  MEWS LOC 0  MEWS Score 1

## 2022-04-02 NOTE — Progress Notes (Signed)
Received a call from [facility] [place] [facility] at 29:56,  Reprting [name] was complaining of chest pressure after taking Keflex earlier today on acute, they drew Troponin and obtain a EKG. She also reports the patient has an allergy to Keflex. Stat Troponin ordered and was placed on Oxygen@ 2 liters Nasal Cannula. This provider placed a call to pharmacy regarding the above and spoke with pharmacist. [name] lab results was reviewed, Keflex was discontinued.  Caryl Pina call this provider again reporting [name] stating increase intensity of chest pain, she was instructed to call her charge nurse and rapidto assess patient. Stat EKG ordered, awaiting results. Marland Kitchen

## 2022-04-02 NOTE — Progress Notes (Signed)
PROGRESS NOTE  [name]  HYI:[id number] DOB: 1923-09-28 DOA: 03/25/2022 PCP: [name], MD   Brief Narrative:  Patient is a 88 year old female with history of DVT, hypertension, hypothyroidism, GERD, peripheral vascular disease, A-fib on Eliquis who presented with acute onset of right-sided weakness, aphasia and slurred speech.  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  After admission, her Eliquis was reversed with Andexxa.  Patient was also placed on Cleviprex for blood pressure control.  She was on neurology service, transferred to [facility] service on 5/17.  PT/OT evaluation done and recommended CIR.  Waiting for placement.     Assessment & Plan:  Principal Problem:   ICH (intracerebral hemorrhage) ([place]) Active Problems:   Hypertensive emergency   Intracerebral hemorrhage: Presented with acute onset of right-sided weakness, aphasia and slurred speech.  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  MRI did not show any significant vascular abnormality.  ICH was most likely in the setting of Eliquis and uncontrolled hypertension.  Carotid Doppler unremarkable.  2D echo showed 55 to 60% EF.  Hemoglobin A1c was 5.9.  LDL of 94.  Eliquis discontinued.  PT/OT recommended CIR on discharge.  Hypertension: Takes losartan, Cardizem, nadolol at home.  Was started on Cleviprex for blood pressure control.  Resumed home medications, added hydralazine.  Monitor blood pressure, currently stable  Hyperlipidemia: LDL of 94.  She does not take any medications at home.  Started on Lipitor 20 mg daily.  Goal of LDL less than 70.  Paroxysmal A-fib: Monitor on telemetry.  Takes Eliquis at home now discontinued due to intracerebral hemorrhage.  Eliquis was reversed with Andexxa.  On rate control with Cardizem, nadolol.  Neurology recommended resumption of anticoagulation after resolution of IPH, should be done as an outpatient.  Dysphagia: Speech therapy was following.  On  dysphagia 3 diet.  Peripheral vascular disease/history of DVT: Was taking Eliquis, now on hold.  AKI: Creatinine trended up y to 1.4,now almost back to baseline.  Started on gentle IV fluids with improvement,check BMP tomorrow  UTI/Leukocytosis:   Started on ceftriaxone.  Urine culture showed Proteus. On keflex  Urine retention: Foley placed,removed but had to be reinserted.  Started on tamsulosin.Follow up with [facility] as an outpatient in a week after discharge         DVT prophylaxis:heparin injection 5,000 Units Start: 03/26/22 1630 SCD's Start: 03/25/22 0248     Code Status: Full Code  Family Communication: Discussed with son at bedside Patient status: Inpatient  Patient is from : Home  Anticipated discharge to: CIR  Estimated DC date: As soon as bed is available   Consultants: Neurology  Procedures: None  Antimicrobials:  Anti-infectives (From admission, onward)    Start     Dose/Rate Route Frequency Ordered Stop   03/31/22 1400  cephALEXin (KEFLEX) capsule 500 mg        500 mg Oral Every 8 hours 03/31/22 1035 04/03/22 1359   03/29/22 1630  cefTRIAXone (ROCEPHIN) 1 g in sodium chloride 0.9 % 100 mL IVPB  Status:  Discontinued        1 g 200 mL/hr over 30 Minutes Intravenous Every 24 hours 03/29/22 1531 03/31/22 1035       Subjective:  Patient seen and examined at the bedside this morning.  Remains comfortable.  Hemodynamically stable.  Eating her breakfast.  Her speech is more clear and communicates  well but not oriented to time   objective: Vitals:   04/02/22 0011 04/02/22 0413 04/02/22 0746 04/02/22 1011  BP: (!) 100/49 140/66 124/62 126/63  Pulse: 72 69 73 66  Resp: 18 (!) '21 20 18  '$ Temp: 97.6 F (36.4 C) 98.3 F (36.8 C) 98.4 F (36.9 C)   TempSrc: Oral Oral Oral   SpO2: 96% 98% 97% 99%  Weight:      Height:        Intake/Output Summary (Last 24 hours) at 04/02/2022 1056 Last data filed at 04/02/2022 0413 Gross per 24 hour   Intake 560 ml  Output 865 ml  Net -305 ml   Filed Weights   03/25/22 0200  Weight: 66 kg    Examination:  General exam: Overall comfortable, not in distress, deconditioned pleasant elderly female HEENT: PERRL Respiratory system:  no wheezes or crackles  Cardiovascular system: S1 & S2 heard, RRR.  Gastrointestinal system: Abdomen is nondistended, soft and nontender. Central nervous system: Alert and awake, right hemiparesis, not oriented to time Extremities: No edema, no clubbing ,no cyanosis Skin: No rashes, no ulcers,no icterus   GU: Foley  Data Reviewed: I have personally reviewed following labs and imaging studies  CBC: Recent Labs  Lab 03/27/22 0239 03/28/22 0427 03/29/22 0259 03/30/22 0248  WBC 9.8 9.0 13.0* 10.7*  HGB 11.6* 10.8* 10.5* 10.0*  HCT 36.1 34.3* 32.4* 30.7*  MCV 90.5 90.5 89.5 89.0  PLT 264 257 251 341   Basic Metabolic Panel: Recent Labs  Lab 03/27/22 0239 03/28/22 0427 03/29/22 0259 03/30/22 0248 03/31/22 1142  NA 140 140 139 140 137  K 4.3 3.5 3.6 3.7 4.0  CL 110 112* 108 111 110  CO2 '24 23 22 '$ 21* 22  GLUCOSE 107* 97 94 109* 107*  BUN 13 22 33* 32* 25*  CREATININE 0.80 1.01* 1.40* 1.20* 1.04*  CALCIUM 9.2 9.2 9.0 8.8* 8.4*  MG  --   --   --   --  1.9     Recent Results (from the past 240 hour(s))  MRSA Next Gen by PCR, Nasal     Status: None   Collection Time: 03/25/22  4:11 AM   Specimen: Nasal Mucosa; Nasal Swab  Result Value Ref Range Status   MRSA by PCR Next Gen NOT DETECTED NOT DETECTED Final    Comment: (NOTE) The GeneXpert MRSA Assay (FDA approved for NASAL specimens only), is one component of a comprehensive MRSA colonization surveillance program. It is not intended to diagnose MRSA infection nor to guide or monitor treatment for MRSA infections. Test performance is not FDA approved in patients less than 27 years old. Performed at [facility], [place] [address] [place], [place] [address]   Urine Culture      Status: Abnormal   Collection Time: 03/29/22  3:50 PM   Specimen: Urine, Catheterized  Result Value Ref Range Status   Specimen Description URINE, CATHETERIZED  Final   Special Requests   Final    NONE Performed at [facility], [address], [place], [place] [address]    Culture >=100,000 COLONIES/mL PROTEUS MIRABILIS (A)  Final   Report Status 03/31/2022 FINAL  Final   Organism ID, Bacteria PROTEUS MIRABILIS (A)  Final      Susceptibility   Proteus mirabilis - MIC*    AMPICILLIN <=2 SENSITIVE Sensitive     CEFAZOLIN <=4 SENSITIVE Sensitive     CEFEPIME <=0.12 SENSITIVE Sensitive     CEFTRIAXONE <=0.25 SENSITIVE Sensitive     CIPROFLOXACIN <=0.25 SENSITIVE Sensitive     GENTAMICIN <=1 SENSITIVE Sensitive  IMIPENEM 2 SENSITIVE Sensitive     NITROFURANTOIN RESISTANT Resistant     TRIMETH/SULFA <=20 SENSITIVE Sensitive     AMPICILLIN/SULBACTAM <=2 SENSITIVE Sensitive     PIP/TAZO <=4 SENSITIVE Sensitive     * >=100,000 COLONIES/mL PROTEUS MIRABILIS     Radiology Studies: No results found.  Scheduled Meds:  atorvastatin  20 mg Oral Daily   cephALEXin  500 mg Oral Q8H   chlorhexidine  15 mL Mouth Rinse BID   Chlorhexidine Gluconate Cloth  6 each Topical Daily   diltiazem  120 mg Oral QPM   gabapentin  300 mg Oral QHS   heparin injection (subcutaneous)  5,000 Units Subcutaneous Q8H   hydrALAZINE  50 mg Oral Q8H   levothyroxine  50 mcg Oral Daily   mouth rinse  15 mL Mouth Rinse q12n4p   nadolol  80 mg Oral Daily   pantoprazole  40 mg Oral Daily   predniSONE  2 mg Oral Q lunch   senna-docusate  1 tablet Oral BID   tamsulosin  0.4 mg Oral Daily   Continuous Infusions:     LOS: 8 days   Shelly Coss, MD [facility] P5/23/2023, 10:56 AM

## 2022-04-02 NOTE — Progress Notes (Addendum)
Pt c/o increased SOB and chest pressure O2 placed on pt per [place] and MD updated on condition. New orders received. Charge nurse notified and rapid response nurse notified. Sheela Stack, LPN

## 2022-04-02 NOTE — H&P (Signed)
Physical Medicine and Rehabilitation Admission H&P     CC:  Functional deficits secondary to left thalamic intracranial hemorrhage   HPI: [name] is a 88 year old female who presented to the emergency department via EMS with right-sided weakness, aphasia, speech apraxia and slurred speech.  Code stroke was activated.  Imaging confirmed left basal ganglia intracranial hemorrhage with trace intraventricular hemorrhage and was in hypertensive emergency on arrival.  She has a history of atrial fibrillation on apixaban therapy.  This was reversed with Andexxa.  She was admitted to the ICU and given labetalol and Cleviprex to keep systolic blood pressure within 130-150.  After swallow evaluation on 5/15, she was started on oral antihypertensives.  She was transferred out of the ICU on 5/16.  Her aphasia was improving but did fluctuate with fatigue.  Serial CTs performed.  She remained hemodynamically stable.  She exhibited increased confusion on 5/19 and urinalysis and culture were obtained.  Ceftriaxone initiated.  She developed urinary retention and Foley was placed, started on tamsulosin and Foley discontinued.  Culture was positive for Proteus and antibiotics were adjusted to Keflex.  Monitored on telemetry and her heart rate was controlled with Cardizem and nadolol.  Neurology recommends resumption of anticoagulation after resolution of her IPH and follow-up as outpatient. The patient requires inpatient medicine and rehabilitation evaluations and services for ongoing dysfunction secondary to left thalamic intracranial hemorrhage.   Spoke with daughter and son as patient being transferred. She has been more somnolent and confused today. She was more alert and engaged yesterday. There was possibility of panic yesterday and daughter believes the chest discomfort could have been acid reflux as she has GERD. Will discontinue Xanax prn.   Review of Systems  Unable to perform ROS: Language      Past  Medical History:  Diagnosis Date   Closed fracture of left distal radius 12/29/2019   DVT (deep venous thrombosis) ([place]) 09/2015    RLE   Dysrhythmia      Afib   GERD (gastroesophageal reflux disease)     Hip fracture ([place]) 12/02/2020   Hypertension     Hypothyroidism     Melanoma ([place]) 06/16/2017    Facial melanoma, removed by Dr. Harvel Quale   Peripheral vascular disease [facility])      peripheral neuropathy         Past Surgical History:  Procedure Laterality Date   ABDOMINAL HYSTERECTOMY       APPENDECTOMY       BACK SURGERY       BREAST SURGERY        left breast biopsy   CARDIOVERSION N/A 05/09/2020    Procedure: CARDIOVERSION;  Surgeon: Skeet Latch, MD;  Location: [place];  Service: Cardiovascular;  Laterality: N/A;   COLOSTOMY N/A 01/06/2020    Procedure: Colostomy;  Surgeon: Greer Pickerel, MD;  Location: [place];  Service: General;  Laterality: N/A;   EYE SURGERY        cataract   HARDWARE REMOVAL Left 01/01/2020    Procedure: HARDWARE REMOVAL;  Surgeon: Iran Planas, MD;  Location: [place];  Service: Orthopedics;  Laterality: Left;   HIP ARTHROPLASTY Left 12/03/2020    Procedure: ARTHROPLASTY BIPOLAR HIP (HEMIARTHROPLASTY);  Surgeon: Renette Butters, MD;  Location: [place];  Service: Orthopedics;  Laterality: Left;   HIP ARTHROPLASTY Left      partial   IR KYPHO EA ADDL LEVEL THORACIC OR LUMBAR   06/27/2021   IR RADIOLOGIST EVAL & MGMT   07/13/2021  LAPAROTOMY N/A 01/06/2020    Procedure: Exploratory Laparotomy, Open Sigmoid colectomy;  Surgeon: Greer Pickerel, MD;  Location: [place];  Service: General;  Laterality: N/A;   LIPOMA EXCISION Left 04/25/2015    Procedure: EXCISION OF LIPOMA LEFT ARM;  Surgeon: Donnie Mesa, MD;  Location: [place];  Service: General;  Laterality: Left;   LUMBAR LAMINECTOMY/DECOMPRESSION MICRODISCECTOMY   11/20/2011    Procedure: LUMBAR LAMINECTOMY/DECOMPRESSION MICRODISCECTOMY;  Surgeon: Laurice Record [place];  Location: [facility];  Service:  Orthopedics;  Laterality: N/A;  Decompressive Laminectomy L2 to the Sacrum (X-Ray)   LYSIS OF ADHESION N/A 01/17/2021    Procedure: RIGID PROCTOSCOPY;  Surgeon: Michael Boston, MD;  Location: [facility];  Service: General;  Laterality: N/A;   OPEN REDUCTION INTERNAL FIXATION (ORIF) DISTAL RADIAL FRACTURE Left 01/01/2020    Procedure: OPEN REDUCTION INTERNAL FIXATION (ORIF) DISTAL RADIAL FRACTURE;  Surgeon: Iran Planas, MD;  Location: [place];  Service: Orthopedics;  Laterality: Left;   OTHER SURGICAL HISTORY        left wrist surgery - has plate in left wrist    OTHER SURGICAL HISTORY        right knee surgery due to torn cartilage    TONSILLECTOMY       WRIST FRACTURE SURGERY Left     XI ROBOTIC ASSISTED COLOSTOMY TAKEDOWN N/A 01/17/2021    Procedure: XI ROBOTIC ASSISTED OSTOMY TAKEDOWN, LYSIS OF ADHESIONS, RECTOSIGMOID RESECTION, BILATERAL TAP BLOCK;  Surgeon: Michael Boston, MD;  Location: [facility];  Service: General;  Laterality: N/A;         Family History  Problem Relation Age of Onset   Cancer Mother          BREAST   COPD Father     Emphysema Father     Cancer Daughter          breast    Social History:  reports that she has never smoked. She has never used smokeless tobacco. She reports that she does not drink alcohol and does not use drugs. Allergies:       Allergies  Allergen Reactions   Keflex [Cephalexin] Nausea Only and Other (See Comments)      Pt ended up in ER w/ CHEST PAIN   Codeine Other (See Comments)      "Nightmares, imagined things"          Medications Prior to Admission  Medication Sig Dispense Refill   acetaminophen (TYLENOL) 500 MG tablet Take 500 mg by mouth at bedtime.       apixaban (ELIQUIS) 5 MG TABS tablet Take 1 tablet (5 mg total) by mouth 2 (two) times daily. 180 tablet 3   diltiazem (CARDIZEM CD) 120 MG 24 hr capsule TAKE 1 CAPSULE BY MOUTH EVERY DAY (Patient taking differently: Take 120 mg by mouth every evening.) 30 capsule 0   docusate sodium  (COLACE) 100 MG capsule Take 1 capsule (100 mg total) by mouth 2 (two) times daily. 60 capsule 0   gabapentin (NEURONTIN) 300 MG capsule TAKE 1 CAPSULE BY MOUTH EVERYDAY AT BEDTIME (Patient taking differently: Take 300 mg by mouth at bedtime.) 90 capsule 1   levothyroxine (SYNTHROID) 50 MCG tablet TAKE 1 TABLET BY MOUTH EVERY DAY (Patient taking differently: Take 50 mcg by mouth daily before breakfast.) 90 tablet 1   losartan (COZAAR) 50 MG tablet Take 2 tablets (100 mg total) by mouth daily. 180 tablet 1   Multiple Vitamins-Minerals (PRESERVISION AREDS 2 PO) Take 1 tablet by mouth daily  with lunch.       nadolol (CORGARD) 80 MG tablet Take 1 tablet (80 mg total) by mouth daily. 90 tablet 1   pantoprazole (PROTONIX) 40 MG tablet TAKE 1 TABLET BY MOUTH EVERY DAY (Patient taking differently: Take 40 mg by mouth daily.) 90 tablet 1   predniSONE (DELTASONE) 1 MG tablet Take 4 tablets (4 mg total) by mouth daily with lunch. (Patient taking differently: Take 2 mg by mouth daily with lunch.)       Vitamin D, Ergocalciferol, (DRISDOL) 1.25 MG (50000 UNIT) CAPS capsule Take 1 capsule (50,000 Units total) by mouth every 7 (seven) days. (Patient taking differently: Take 50,000 Units by mouth every 7 (seven) days. No set day) 12 capsule 0          Home: Home Living Family/patient expects to be discharged to:: Private residence Living Arrangements: Alone Available Help at Discharge: Personal care attendant, Family, Available PRN/intermittently Type of Home: House Home Access: Stairs to enter [employer] of Steps: 2 Entrance Stairs-Rails: Right, Left, Can reach both Home Layout: One level Bathroom Shower/Tub: Multimedia programmer: Handicapped height Bathroom Accessibility: Yes Home Equipment: Conservation officer, nature (2 wheels), Grab bars - tub/shower, BSC/3in1, Cane - single point Additional Comments: caregiver comes 4 days/ wk, 9-3p. Numerous family members live within 2 mi and are alerted  whenever she pushed life alert button as she did this time  Lives With: Alone   Functional History: Prior Function Prior Level of Function : Needs assist Mobility Comments: was ambulating independently with RW ADLs Comments: caregiver present for bathing but not physically assisting   Functional Status:  Mobility: Bed Mobility Overal bed mobility: Needs Assistance Bed Mobility: Supine to Sit Rolling: Mod assist Sidelying to sit: Max assist Supine to sit: Mod assist, HOB elevated, +2 for safety/equipment Sit to supine: +2 for physical assistance, Total assist General bed mobility comments: pt able to bring LEs over towards EOB with directional verbal cues and minA for R LE, modA for trunk elevation to EOB Transfers Overall transfer level: Needs assistance Equipment used: 2 person hand held assist (face to face transfer) Transfers: Bed to chair/wheelchair/BSC, Sit to/from Stand Sit to Stand: +2 physical assistance, Max assist Bed to/from chair/wheelchair/BSC transfer type:: Step pivot Squat pivot transfers: Max assist Step pivot transfers: Max assist, +2 physical assistance General transfer comment: maxA x2 to power up with R knee blocked. pt with improved stepping ability with L LE, requires maxA to advance R LE Ambulation/Gait General Gait Details: unable   ADL: ADL Overall ADL's : Needs assistance/impaired Eating/Feeding: NPO Grooming: Wash/dry face, Wash/dry hands, Standing Grooming Details (indicate cue type and reason): completed standing at sink with LUE, WB through [employer] Transfer: Moderate assistance, Maximal assistance, +2 for physical assistance, Stand-pivot, BSC/3in1 Toilet Transfer Details (indicate cue type and reason): simulated to chair Functional mobility during ADLs: Maximal assistance, Moderate assistance, +2 for physical assistance General ADL Comments: edcuated son on benefits of allowing pt. to attempt familiar tasks before assisting her with them    Cognition: Cognition Overall Cognitive Status: Impaired/Different from baseline Arousal/Alertness: Lethargic Orientation Level: Oriented to person Awareness: Appears intact Cognition Arousal/Alertness: Awake/alert Behavior During Therapy: Flat affect Overall Cognitive Status: Impaired/Different from baseline Area of Impairment: Attention, Following commands, Safety/judgement, Awareness, Problem solving Orientation Level: Disoriented to, Place, Situation, Time Current Attention Level: Sustained Following Commands: Follows one step commands with increased time Safety/Judgement: Decreased awareness of safety, Decreased awareness of deficits (R inattention) Awareness: Intellectual Problem Solving: Slow processing, Requires verbal  cues, Difficulty sequencing, Requires tactile cues General Comments: pt anxious with getting OOB/mobility, pt reporting difficulty breathing, however pt endorses anxiety/nervousness with mobility when asked, SpO2 >98% on 2Lo2 via [place] Difficult to assess due to: Impaired communication   Physical Exam: Blood pressure (!) 116/51, pulse 66, temperature (!) 97.4 F (36.3 C), temperature source Axillary, resp. rate 20, height '5\' 6"'$  (1.676 m), weight 66 kg, SpO2 99 %. Physical Exam Constitutional:      Appearance: Normal appearance.  HENT:     Head: Normocephalic.     Nose: Nose normal.     Mouth/Throat:     Mouth: Mucous membranes are moist.  Eyes:     Pupils: Pupils are equal, round, and reactive to light.  Cardiovascular:     Rate and Rhythm: Normal rate and regular rhythm.  Pulmonary:     Effort: Pulmonary effort is normal.  Abdominal:     Palpations: Abdomen is soft.  Musculoskeletal:     Cervical back: Normal range of motion.  Skin:    General: Skin is warm.  Neurological:     Mental Status: She is alert.     Comments: Pt is alert. Right central 7. Mild dysarthria. Aphasic with delayed naming, can repeat.  RUE 2-3/5. RLE 2/5. No focal sensory  deficits. No abnl resting tone.       Lab Results Last 48 Hours        Results for orders placed or performed during the hospital encounter of 03/25/22 (from the past 48 hour(s))  Glucose, capillary     Status: Abnormal    Collection Time: 03/31/22  7:27 PM  Result Value Ref Range    Glucose-Capillary 111 (H) 70 - 99 mg/dL      Comment: Glucose reference range applies only to samples taken after fasting for at least 8 hours.    Comment 1 Notify RN      Comment 2 Document in Chart    Troponin I (High Sensitivity)     Status: None    Collection Time: 04/02/22 11:54 AM  Result Value Ref Range    Troponin I (High Sensitivity) 13 <18 ng/L      Comment: (NOTE) Elevated high sensitivity troponin I (hsTnI) values and significant  changes across serial measurements may suggest ACS but many other  chronic and acute conditions are known to elevate hsTnI results.  Refer to the "Links" section for chest pain algorithms and additional  guidance. Performed at [facility], [place] [address] [place],  [address]        Imaging Results (Last 48 hours)  No results found.         Blood pressure (!) 116/51, pulse 66, temperature (!) 97.4 F (36.3 C), temperature source Axillary, resp. rate 20, height '5\' 6"'$  (1.676 m), weight 66 kg, SpO2 99 %.   Medical Problem List and Plan: 1. Functional deficits secondary to left thalamic intracranial hemorrhage             -patient may shower             -ELOS/Goals: 14-16 days, supervision to min assist 2.  Antithrombotics: -DVT/anticoagulation:  Pharmaceutical: Heparin             -antiplatelet therapy: none 3. Pain Management: Tylenol as needed. Neurontin 300 mg q HS 4. Mood: LCSW to evaluate and provide emotional support             -antipsychotic agents: n/a 5. Neuropsych: This patient is not capable  of making decisions on her own behalf. 6. Skin/Wound Care: Routine skin care checks 7. Fluids/Electrolytes/Nutrition: Routine Is and Os and  follow-up chemistries             --dysphagia 3 diet with thin liquids 8. Hypertension: continue diltiazem, hydralazine, nadolol. Home losartan held 9: Hyperlipidemia: continue atorvastatin 10: GI prophylaxis: continue pantoprazole 11: pAF: continue diltiazem. No AC until resolution of ICH, outpt follow-up 12: Hypothyroidism: continue Synthroid 13: Urinary retention: continue Flomax             -monitor output, check PVR's 14: UTI: continue Keflex 15: AKI: BUN and Cr continue downward trend; follow-up BMP             -encourage fluids.  16: Leukocytosis: no fever, downward trend; follow-up CBC 17: Anemia, chronic: no evidence of acute blood loss; follow-up CBC  18: Polymyalgia rheumatica: prednisone 2 mg with lunch 19. Dysphagia: tolerating D3/thin diet             -advance per SLP   Barbie Banner, PA-C 04/02/2022   I have personally performed a face to face diagnostic evaluation of this patient and formulated the key components of the plan.  Additionally, I have personally reviewed laboratory data, imaging studies, as well as relevant notes and concur with the physician assistant's documentation above.  The patient's status has not changed from the original H&P.  Any changes in documentation from the acute care chart have been noted above.  Meredith Staggers, MD, [facility]

## 2022-04-02 NOTE — Progress Notes (Signed)
   04/02/22 1855  Assess: MEWS Score  Temp 98.1 F (36.7 C)  BP (!) 94/51  Pulse Rate 66  Resp (!) 24  SpO2 99 %  O2 Device Nasal Cannula  O2 Flow Rate (L/min) 2 L/min  Assess: MEWS Score  MEWS Temp 0  MEWS Systolic 1  MEWS Pulse 0  MEWS RR 1  MEWS LOC 0  MEWS Score 2  MEWS Score Color Yellow  Assess: if the MEWS score is Yellow or Red  Were vital signs taken at a resting state? Yes  Focused Assessment Change from prior assessment (see assessment flowsheet)  Early Detection of Sepsis Score *See Row Information* Low  Treat  MEWS Interventions Administered prn meds/treatments  Pain Scale 0-10  Pain Score 10  Pain Type Acute pain  Pain Location Chest  Pain Descriptors / Indicators Aching  Pain Frequency Constant  Pain Intervention(s) Medication (See eMAR)  Take Vital Signs  Increase Vital Sign Frequency  Yellow: Q 2hr X 2 then Q 4hr X 2, if remains yellow, continue Q 4hrs  Escalate  MEWS: Escalate Yellow: discuss with charge nurse/RN and consider discussing with provider and RRT  Notify: Charge Nurse/RN  Name of Charge Nurse/RN Notified Santiago Glad RN  Date Charge Nurse/RN Notified 04/02/22  Time Charge Nurse/RN Notified 1855  Notify: Provider  Provider Name/Title Zella Ball NP  Date Provider Notified 04/02/22  Time Provider Notified 1739  Method of Notification Call  Notification Reason Change in status  Provider response See new orders  Date of Provider Response 04/02/22  Time of Provider Response 1855  Notify: Rapid Response  Date Rapid Response Notified 04/02/22  Time Rapid Response Notified 1840  Document  Patient Outcome  (pt stayed in unit; provider on call notified; new orders noted)  Progress note created (see row info) Yes

## 2022-04-02 NOTE — Progress Notes (Addendum)
   04/02/22 1011  Vitals  BP 126/63  MAP (mmHg) 83  BP Location Left Arm  BP Method Automatic  Patient Position (if appropriate) Lying  Pulse Rate 66  Pulse Rate Source Dinamap  Resp 18  Level of Consciousness  Level of Consciousness Alert  MEWS COLOR  MEWS Score Color Green  Oxygen Therapy  SpO2 99 %  O2 Device Room Air  O2 Flow Rate (L/min) 0 L/min  MEWS Score  MEWS Temp 0  MEWS Systolic 0  MEWS Pulse 0  MEWS RR 0  MEWS LOC 0  MEWS Score 0   1010 Patient complained about SOB. Vitals stable. Patient asked to take deep breaths.  1015 Patient continued to state that she felt out of breath. O2 nasal cannula placed on 2 Liters. Family at bedside. \ 1025 Patient continues to state that she feels out of breath. She continues to sat 99%.  1115 Patient states she feels tight in the chest. Patient sitting up in bed. She states that she is still having difficulty breathing. O2 sats are 98% on 2L O2 Nasal cannula. MD contacted.

## 2022-04-02 NOTE — Progress Notes (Signed)
Occupational Therapy Treatment Patient Details Name: [name] MRN: [id number] DOB: [birth year] Today's Date: 04/02/2022   History of present illness Pt is 88 yo female who presents with R sided weakness, aphasia, and slurred speech. CT head showed L thalamic IPH.  PMH: DVT, HTN, hypothyoroidism, GERD, PVD, Afib.   OT comments  Pt progressing towards goals, requiring max A +2 for transfer, mod A +2 for bed mobility. Pt able to stand at sink for grooming task, WB through RUE and using LUE for task. Pt with difficulty grasping comb with RUE, will consider built up handles for future sessions. Pt anxious with mobility during session, reporting difficulty with breathing despite SpO2 98% and above on 2L O2. Pt presenting with impairments listed below, will follow acutely. Continue to recommend AIR at d/c.   Recommendations for follow up therapy are one component of a multi-disciplinary discharge planning process, led by the attending physician.  Recommendations may be updated based on patient status, additional functional criteria and insurance authorization.    Follow Up Recommendations  Acute inpatient rehab (3hours/day)    Assistance Recommended at Discharge Frequent or constant Supervision/Assistance  Patient can return home with the following  A lot of help with walking and/or transfers;A lot of help with bathing/dressing/bathroom;Assistance with cooking/housework;Assistance with feeding;Direct supervision/assist for medications management;Direct supervision/assist for financial management;Assist for transportation;Help with stairs or ramp for entrance   Equipment Recommendations       Recommendations for Other Services Rehab consult    Precautions / Restrictions Precautions Precautions: Fall Precaution Comments: weakness LLE from back surgery, cartilage damage R knee. Falling has been an issue past few yrs (hip fx, wrist fx) but she has not had any recent falls Restrictions Weight  Bearing Restrictions: No       Mobility Bed Mobility Overal bed mobility: Needs Assistance Bed Mobility: Supine to Sit     Supine to sit: Mod assist, HOB elevated, +2 for safety/equipment          Transfers Overall transfer level: Needs assistance Equipment used: 2 person hand held assist Transfers: Bed to chair/wheelchair/BSC, Sit to/from Stand Sit to Stand: +2 physical assistance, Max assist     Step pivot transfers: Max assist, +2 physical assistance           Balance Overall balance assessment: Needs assistance Sitting-balance support: Single extremity supported, Feet supported Sitting balance-Leahy Scale: Poor Sitting balance - Comments: pt with improved sitting balance this date, able to maintain midline with verbal cues, close min guard Postural control: Right lateral lean Standing balance support: Single extremity supported Standing balance-Leahy Scale: Zero                             ADL either performed or assessed with clinical judgement   ADL Overall ADL's : Needs assistance/impaired     Grooming: Wash/dry face;Wash/dry hands;Standing Grooming Details (indicate cue type and reason): completed standing at sink with LUE, WB through [employer] Transfer: Moderate assistance;Maximal assistance;+2 for physical assistance;Stand-pivot;BSC/3in1 Toilet Transfer Details (indicate cue type and reason): simulated to chair         Functional mobility during ADLs: Maximal assistance;Moderate assistance;+2 for physical assistance      Extremity/Trunk Assessment Upper Extremity Assessment Upper Extremity Assessment: RUE deficits/detail RUE Deficits / Details: nearly full range with increased time and cues. ataxic movement. 3/5 grossly RUE Sensation: decreased light  touch;decreased proprioception RUE Coordination: decreased fine motor;decreased gross motor   Lower Extremity Assessment Lower Extremity Assessment: Defer to PT  evaluation        Vision   Vision Assessment?: Vision impaired- to be further tested in functional context Additional Comments: macular degeneration   Perception Perception Perception: Impaired (R inattention)   Praxis Praxis Praxis: Not tested    Cognition Arousal/Alertness: Awake/alert Behavior During Therapy: Flat affect Overall Cognitive Status: Impaired/Different from baseline Area of Impairment: Attention, Following commands, Safety/judgement, Awareness, Problem solving                   Current Attention Level: Sustained   Following Commands: Follows one step commands with increased time Safety/Judgement: Decreased awareness of safety, Decreased awareness of deficits (R inattention) Awareness: Intellectual Problem Solving: Slow processing, Requires verbal cues, Difficulty sequencing, Requires tactile cues General Comments: pt anxious with getting OOB/mobility, pt reporting difficulty breathing, however pt endorses anxiety/nervousness with mobility when asked        Exercises      Shoulder Instructions       General Comments SpO2 98% and above on 2L O2 during session    Pertinent Vitals/ Pain       Pain Assessment Pain Assessment: Faces Pain Score: 4  Faces Pain Scale: Hurts even more Pain Location: R hand  Home Living                                          Prior Functioning/Environment              Frequency  Min 2X/week        Progress Toward Goals  OT Goals(current goals can now be found in the care plan section)  Progress towards OT goals: Progressing toward goals  Acute Rehab OT Goals Patient Stated Goal: none stated OT Goal Formulation: With family Time For Goal Achievement: 04/09/22 Potential to Achieve Goals: Fair ADL Goals Pt Will Perform Grooming: with min assist;sitting Pt Will Perform Upper Body Dressing: with min assist;sitting Pt Will Perform Lower Body Dressing: with mod assist;sit to/from  stand Pt Will Transfer to Toilet: with mod assist;ambulating Additional ADL Goal #1: Pt will follow 1 step directions 75% of the session to assist with ADLs  Plan Discharge plan remains appropriate;Frequency remains appropriate    Co-evaluation    PT/OT/SLP Co-Evaluation/Treatment: Yes Reason for Co-Treatment: Complexity of the patient's impairments (multi-system involvement);Necessary to address cognition/behavior during functional activity;To address functional/ADL transfers;For patient/therapist safety   OT goals addressed during session: ADL's and self-care      AM-PAC OT "6 Clicks" Daily Activity     Outcome Measure   Help from another person eating meals?: A Lot Help from another person taking care of personal grooming?: A Lot Help from another person toileting, which includes using toliet, bedpan, or urinal?: A Lot Help from another person bathing (including washing, rinsing, drying)?: A Lot Help from another person to put on and taking off regular upper body clothing?: A Lot Help from another person to put on and taking off regular lower body clothing?: Total 6 Click Score: 11    End of Session Equipment Utilized During Treatment: Gait belt  OT Visit Diagnosis: Unsteadiness on feet (R26.81);Other abnormalities of gait and mobility (R26.89);Muscle weakness (generalized) (M62.81);History of falling (Z91.81);Hemiplegia and hemiparesis Hemiplegia - Right/Left: Right Hemiplegia - dominant/non-dominant: Dominant Hemiplegia - caused by: Nontraumatic intracerebral hemorrhage  Activity Tolerance Patient tolerated treatment well   Patient Left in chair;with call bell/phone within reach;with chair alarm set   Nurse Communication Mobility status        Time: 8329-1916 OT Time Calculation (min): 33 min  Charges: OT General Charges $OT Visit: 1 Visit OT Treatments $Self Care/Home Management : 8-22 mins  Lynnda Child, OTD, OTR/L Acute Rehab [phone]) [phone]  [place] 04/02/2022, 1:13 PM

## 2022-04-02 NOTE — Progress Notes (Signed)
Physical Therapy Treatment Patient Details Name: [name] MRN: [id number] DOB: 07/23/1923 Today's Date: 04/02/2022   History of Present Illness Pt is 88 yo female who presents with R sided weakness, aphasia, and slurred speech. CT head showed L thalamic IPH.  PMH: DVT, HTN, hypothyoroidism, GERD, PVD, Afib.    PT Comments    Pt with anxiety re: "I can't breath" today however SpO2 at 98% on 2lO2 via [place], HR in 70s. Pt educated on deep breaths. Worked with OT today on functional standing, PT to support R UE and LE in standing at sink while working with OT on ADLs at sink. Pt with improved ability to move R UE and LE however does required blocking of R knee to prevent buckling. Completed 2 bouts of standing about 2 minutes each. Acute PT to cont to follow.     Recommendations for follow up therapy are one component of a multi-disciplinary discharge planning process, led by the attending physician.  Recommendations may be updated based on patient status, additional functional criteria and insurance authorization.  Follow Up Recommendations  Acute inpatient rehab (3hours/day)     Assistance Recommended at Discharge Frequent or constant Supervision/Assistance  Patient can return home with the following Two people to help with walking and/or transfers;Two people to help with bathing/dressing/bathroom;Assistance with cooking/housework;Assistance with feeding;Direct supervision/assist for medications management;Direct supervision/assist for financial management;Assist for transportation;Help with stairs or ramp for entrance   Equipment Recommendations  Wheelchair (measurements PT);Wheelchair cushion (measurements PT)    Recommendations for Other Services Rehab consult     Precautions / Restrictions Precautions Precautions: Fall Precaution Comments: weakness LLE from back surgery, cartilage damage R knee. Falling has been an issue past few yrs (hip fx, wrist fx) but she has not had any  recent falls Restrictions Weight Bearing Restrictions: No     Mobility  Bed Mobility Overal bed mobility: Needs Assistance Bed Mobility: Supine to Sit     Supine to sit: Mod assist, HOB elevated, +2 for safety/equipment     General bed mobility comments: pt able to bring LEs over towards EOB with directional verbal cues and minA for R LE, modA for trunk elevation to EOB    Transfers Overall transfer level: Needs assistance Equipment used: 2 person hand held assist (face to face transfer) Transfers: Bed to chair/wheelchair/BSC, Sit to/from Stand Sit to Stand: +2 physical assistance, Max assist   Step pivot transfers: Max assist, +2 physical assistance Squat pivot transfers: Max assist     General transfer comment: maxA x2 to power up with R knee blocked. pt with improved stepping ability with L LE, requires maxA to advance R LE    Ambulation/Gait               General Gait Details: unable   Stairs             Wheelchair Mobility    Modified Rankin (Stroke Patients Only) Modified Rankin (Stroke Patients Only) Pre-Morbid Rankin Score: No symptoms Modified Rankin: Severe disability     Balance Overall balance assessment: Needs assistance Sitting-balance support: Single extremity supported, Feet supported Sitting balance-Leahy Scale: Poor Sitting balance - Comments: pt with improved sitting balance this date, able to maintain midline with verbal cues, close min guard Postural control: Right lateral lean Standing balance support: Single extremity supported Standing balance-Leahy Scale: Zero Standing balance comment: dependent on PT and tech  Cognition Arousal/Alertness: Awake/alert Behavior During Therapy: Flat affect Overall Cognitive Status: Impaired/Different from baseline Area of Impairment: Attention, Following commands, Safety/judgement, Awareness, Problem solving                 Orientation Level:  Disoriented to, Place, Situation, Time Current Attention Level: Sustained   Following Commands: Follows one step commands with increased time Safety/Judgement: Decreased awareness of safety, Decreased awareness of deficits (R inattention) Awareness: Intellectual Problem Solving: Slow processing, Requires verbal cues, Difficulty sequencing, Requires tactile cues General Comments: pt anxious with getting OOB/mobility, pt reporting difficulty breathing, however pt endorses anxiety/nervousness with mobility when asked, SpO2 >98% on 2Lo2 via [place]        Exercises      General Comments General comments (skin integrity, edema, etc.): SpO2 >98% on 2Lo2 via [place]      Pertinent Vitals/Pain Pain Assessment Pain Assessment: Faces Faces Pain Scale: No hurt    Home Living                          Prior Function            PT Goals (current goals can now be found in the care plan section) Acute Rehab PT Goals Patient Stated Goal: none stated PT Goal Formulation: With patient/family Time For Goal Achievement: 04/09/22 Potential to Achieve Goals: Fair Progress towards PT goals: Progressing toward goals    Frequency    Min 4X/week      PT Plan Current plan remains appropriate    Co-evaluation PT/OT/SLP Co-Evaluation/Treatment: Yes Reason for Co-Treatment: Complexity of the patient's impairments (multi-system involvement) PT goals addressed during session: Mobility/safety with mobility OT goals addressed during session: ADL's and self-care      AM-PAC PT "6 Clicks" Mobility   Outcome Measure  Help needed turning from your back to your side while in a flat bed without using bedrails?: A Lot Help needed moving from lying on your back to sitting on the side of a flat bed without using bedrails?: A Lot Help needed moving to and from a bed to a chair (including a wheelchair)?: Total Help needed standing up from a chair using your arms (e.g., wheelchair or bedside chair)?:  Total Help needed to walk in hospital room?: Total Help needed climbing 3-5 steps with a railing? : Total 6 Click Score: 8    End of Session Equipment Utilized During Treatment: Gait belt Activity Tolerance: Patient tolerated treatment well Patient left: with call bell/phone within reach;in chair;with chair alarm set Nurse Communication: Mobility status;Need for lift equipment PT Visit Diagnosis: Hemiplegia and hemiparesis;Difficulty in walking, not elsewhere classified (R26.2) Hemiplegia - Right/Left: Right Hemiplegia - dominant/non-dominant: Dominant Hemiplegia - caused by: Nontraumatic SAH Pain - part of body:  (back)     Time: 5726-2035 PT Time Calculation (min) (ACUTE ONLY): 27 min  Charges:  $Neuromuscular Re-education: 8-22 mins                     Kittie Plater, PT, DPT Acute Rehabilitation Services Secure chat preferred Office #: [phone]    Berline Lopes 04/02/2022, 1:34 PM

## 2022-04-02 NOTE — Progress Notes (Signed)
New orders received per MD Hayes Ludwig, LPN

## 2022-04-02 NOTE — Progress Notes (Signed)
Late entry, called to room by RN, pt c/o of chest pain and difficulty breathing, currently on 2L O2 via [place], O2 SAT 99 to 100%, RN gave nitro order that was obtained, called RRT, obtained EKG (NSR), labs obtained and chest xray pending. Repeat VS taken reporting soft BP, feet elevated. MEWs protocol started, pt placed with RN for next shift with results of troponin and chest xray to be called in on-call.

## 2022-04-02 NOTE — Progress Notes (Addendum)
1420 Patient is feeling better about her chest tightness and shortness of breath, but states that she still has some chest tightness sensation. Family informed about the Xanax PRN order and they were concerned about that medicine being too strong for patient. Patient has no history of using anxiolytics or alcohol. Patient has taken benadryl once in a while for allergies or to help her sleep at night.   1520 Patient's foley bag emptied 662m. Urine I slight yellow and clear. MD asked to confirm D/C Foley order from 5/21.   MD confirmed patient to transfer to CIR with foley in place.

## 2022-04-02 NOTE — TOC Transition Note (Signed)
Transition of Care [facility]) - CM/SW Discharge Note   Patient Details  Name: [name] MRN: [id number] Date of Birth: 09/07/1923  Transition of Care [facility]) CM/SW Contact:  Pollie Friar, RN Phone Number: 04/02/2022, 3:27 PM   Clinical Narrative:    Patient is discharging to CIR today. No needs per TOC.   Final next level of care: IP Rehab Facility Barriers to Discharge: No Barriers Identified   Patient Goals and CMS Choice     Choice offered to / list presented to : Patient  Discharge Placement                       Discharge Plan and Services                                     Social Determinants of Health (SDOH) Interventions     Readmission Risk Interventions     View : No data to display.

## 2022-04-02 NOTE — Progress Notes (Signed)
Inpatient Rehab Admissions Coordinator:   Continue to await determination from [facility].  Opened request for CIR prior auth on 5/18 at 11:27 AM.   Shann Medal, PT, DPT Admissions Coordinator [phone] 04/02/22  10:46 AM

## 2022-04-02 NOTE — Progress Notes (Signed)
Inpatient Rehab Admissions Coordinator:    I have insurance approval and a bed available for pt to admit to CIR today. Dr. Tawanna Solo in agreement, [facility] and pt/family aware.    Shann Medal, PT, DPT Admissions Coordinator [phone] 04/02/22  3:13 PM

## 2022-04-02 NOTE — Discharge Summary (Signed)
Physician Discharge Summary  [name] WYO:[id number] DOB: 1923/06/27 DOA: 03/25/2022  PCP: [name], MD  Admit date: 03/25/2022 Discharge date: 04/02/2022  Admitted From: Home Disposition:  Home  Discharge Condition:Stable CODE STATUS:FULL Diet recommendation: Dysphagia 3   Brief/Interim Summary:  Patient is a 88 year old female with history of DVT, hypertension, hypothyroidism, GERD, peripheral vascular disease, A-fib on Eliquis who presented with acute onset of right-sided weakness, aphasia and slurred speech.  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  After admission, her Eliquis was reversed with Andexxa.  Patient was also placed on Cleviprex for blood pressure control.  She was on neurology service, transferred to [facility] service on 5/17.  PT/OT evaluation done and recommended CIR. medically stable for discharge today.  Following problems were addressed during her hospitalization:    Intracerebral hemorrhage: Presented with acute onset of right-sided weakness, aphasia and slurred speech.  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  MRI did not show any significant vascular abnormality.  ICH was most likely in the setting of Eliquis and uncontrolled hypertension.  Carotid Doppler unremarkable.  2D echo showed 55 to 60% EF.  Hemoglobin A1c was 5.9.  LDL of 94.  Eliquis discontinued.  PT/OT recommended CIR on discharge.   Hypertension: Takes losartan, Cardizem, nadolol at home.  Was started on Cleviprex for blood pressure control.  Resumed home medications, added hydralazine.  Monitor blood pressure, currently stable   Hyperlipidemia: LDL of 94.  She does not take any medications at home.  Started on Lipitor 20 mg daily.  Goal of LDL less than 70.   Paroxysmal A-fib: Monitor on telemetry.  Takes Eliquis at home now discontinued due to intracerebral hemorrhage.  Eliquis was reversed with Andexxa.  On rate control with Cardizem, nadolol.  Neurology  recommended resumption of anticoagulation after resolution of IPH, should be done as an outpatient.   Dysphagia: Speech therapy was following.  On dysphagia 3 diet.   Peripheral vascular disease/history of DVT: Was taking Eliquis, now on hold.   AKI: Creatinine trended up y to 1.4,now almost back to baseline.   UTI/Leukocytosis:   Started on ceftriaxone.  Urine culture showed Proteus. Changed to keflex,completed course   Urine retention: Foley placed,removed but had to be reinserted.  Started on tamsulosin.Follow up with [facility] as an outpatient if continues to retain .  Can give voiding trial at [facility]      Discharge Diagnoses:  Principal Problem:   ICH (intracerebral hemorrhage) [facility]) Active Problems:   Hypertensive emergency    Discharge Instructions  Discharge Instructions     Ambulatory referral to Neurology   Complete by: As directed    Follow up with stroke clinic NP (Jessica Vanschaick or Cecille Rubin, if both not available, consider Zachery Dauer, or Ahern) at [facility] in about 4 weeks. Thanks.   Diet general   Complete by: As directed    Dysphagia 3   Discharge instructions   Complete by: As directed    1)Please take prescribed medications as instructed 2)Follow up with [facility] neurology in 4 weeks.  Name and number provider has been attached 3)Follow up with [facility] in 2 weeks if you continue to retain urine   Increase activity slowly   Complete by: As directed       Allergies as of 04/02/2022       Reactions   Keflex [cephalexin] Nausea Only, Other (See Comments)   Pt ended up in ER w/ CHEST PAIN   Codeine Other (See Comments)   "Nightmares,  imagined things"        Medication List     STOP taking these medications    apixaban 5 MG Tabs tablet Commonly known as: ELIQUIS   losartan 50 MG tablet Commonly known as: COZAAR       TAKE these medications    acetaminophen 500 MG tablet Commonly known as: TYLENOL Take 500 mg by  mouth at bedtime.   ALPRAZolam 0.25 MG tablet Commonly known as: XANAX Take 1 tablet (0.25 mg total) by mouth at bedtime as needed for anxiety.   atorvastatin 20 MG tablet Commonly known as: LIPITOR Take 1 tablet (20 mg total) by mouth daily. Start taking on: Apr 03, 2022   diltiazem 120 MG 24 hr capsule Commonly known as: CARDIZEM CD TAKE 1 CAPSULE BY MOUTH EVERY DAY What changed:  how much to take when to take this   docusate sodium 100 MG capsule Commonly known as: COLACE Take 1 capsule (100 mg total) by mouth 2 (two) times daily.   gabapentin 300 MG capsule Commonly known as: NEURONTIN TAKE 1 CAPSULE BY MOUTH EVERYDAY AT BEDTIME What changed: See the new instructions.   hydrALAZINE 50 MG tablet Commonly known as: APRESOLINE Take 1 tablet (50 mg total) by mouth every 8 (eight) hours.   levothyroxine 50 MCG tablet Commonly known as: SYNTHROID TAKE 1 TABLET BY MOUTH EVERY DAY What changed: when to take this   nadolol 80 MG tablet Commonly known as: CORGARD Take 1 tablet (80 mg total) by mouth daily.   pantoprazole 40 MG tablet Commonly known as: PROTONIX TAKE 1 TABLET BY MOUTH EVERY DAY   predniSONE 1 MG tablet Commonly known as: DELTASONE Take 2 tablets (2 mg total) by mouth daily with lunch. Start taking on: Apr 03, 2022   PRESERVISION AREDS 2 PO Take 1 tablet by mouth daily with lunch.   senna-docusate 8.6-50 MG tablet Commonly known as: Senokot-S Take 1 tablet by mouth 2 (two) times daily.   tamsulosin 0.4 MG Caps capsule Commonly known as: FLOMAX Take 1 capsule (0.4 mg total) by mouth daily. Start taking on: Apr 03, 2022   Vitamin D (Ergocalciferol) 1.25 MG (50000 UNIT) Caps capsule Commonly known as: DRISDOL Take 1 capsule (50,000 Units total) by mouth every 7 (seven) days. What changed: additional instructions        Follow-up Information     [facility]. Schedule an appointment as soon as possible for a visit in 1  month(s).   Specialty: Neurology Why: stroke clinic Contact information: [place]. Schedule an appointment as soon as possible for a visit in 2 week(s).   Contact information: [place] [phone]               Allergies  Allergen Reactions   Keflex [Cephalexin] Nausea Only and Other (See Comments)    Pt ended up in ER w/ CHEST PAIN   Codeine Other (See Comments)    "Nightmares, imagined things"    Consultations: Neurology   Procedures/Studies: CT HEAD WO CONTRAST (5MM)  Result Date: 03/27/2022 CLINICAL DATA:  Stroke, hemorrhagic EXAM: CT HEAD WITHOUT CONTRAST TECHNIQUE: Contiguous axial images were obtained from the base of the skull through the vertex without intravenous contrast. RADIATION DOSE REDUCTION: This exam was performed according to the departmental dose-optimization program which includes automated exposure control, adjustment of the mA and/or  kV according to patient size and/or use of iterative reconstruction technique. COMPARISON:  03/25/2022 FINDINGS: Brain: Persistent but slightly decreased size of parenchymal hemorrhage centered in the left thalamus. Similar surrounding edema and mild mass effect. Intraventricular extension again noted with similar layering blood products in the occipital horns. No hydrocephalus. No new hemorrhage. Stable findings of parenchymal volume loss and probable chronic microvascular ischemic changes in the cerebral white matter. Vascular: There is atherosclerotic calcification at the skull base. Skull: Calvarium is unremarkable. Sinuses/Orbits: No acute finding. Other: None. IMPRESSION: Similar evolving left thalamic hemorrhage with intraventricular extension. No new hemorrhage or worsening mass effect. Electronically Signed   By: Macy Mis M.D.   On: 03/27/2022 10:34   CT HEAD WO  CONTRAST (5MM)  Result Date: 03/25/2022 CLINICAL DATA:  Hemorrhagic stroke EXAM: CT HEAD WITHOUT CONTRAST TECHNIQUE: Contiguous axial images were obtained from the base of the skull through the vertex without intravenous contrast. RADIATION DOSE REDUCTION: This exam was performed according to the departmental dose-optimization program which includes automated exposure control, adjustment of the mA and/or kV according to patient size and/or use of iterative reconstruction technique. COMPARISON:  03/25/2022 at 8 o'clock a.m. FINDINGS: Brain: Unchanged area of intraparenchymal hemorrhage in the left thalamus. Small amount of intraventricular blood is also unchanged. No midline shift or other mass effect. Mild edema surrounding the hemorrhage site. There is periventricular hypoattenuation compatible with chronic microvascular disease. Vascular: No abnormal hyperdensity of the major intracranial arteries or dural venous sinuses. No intracranial atherosclerosis. Skull: The visualized skull base, calvarium and extracranial soft tissues are normal. Sinuses/Orbits: No fluid levels or advanced mucosal thickening of the visualized paranasal sinuses. No mastoid or middle ear effusion. The orbits are normal. IMPRESSION: Unchanged area of intraparenchymal hemorrhage in the left thalamus with small amount of intraventricular blood. Electronically Signed   By: Ulyses Jarred M.D.   On: 03/25/2022 21:43   CT HEAD WO CONTRAST  Result Date: 03/25/2022 CLINICAL DATA:  Stroke, hemorrhagic EXAM: CT HEAD WITHOUT CONTRAST TECHNIQUE: Contiguous axial images were obtained from the base of the skull through the vertex without intravenous contrast. RADIATION DOSE REDUCTION: This exam was performed according to the departmental dose-optimization program which includes automated exposure control, adjustment of the mA and/or kV according to patient size and/or use of iterative reconstruction technique. COMPARISON:  Earlier same day FINDINGS:  Brain: Acute parenchymal hemorrhage centered in the left thalamus and adjacent white matter is again identified. Size is similar. There is slightly increased intraventricular extension of hemorrhage. Similar surrounding edema with associated mild mass effect. No hydrocephalus. No new loss of gray-white differentiation. Stable findings of probable chronic microvascular ischemic changes in the cerebral white matter. Vascular: No new findings. Skull: Calvarium is unremarkable. Sinuses/Orbits: No acute finding. Other: None. IMPRESSION: Similar left thalamic hemorrhage and associated edema and mild mass effect. Slightly increased intraventricular extension. No hydrocephalus. Electronically Signed   By: Macy Mis M.D.   On: 03/25/2022 08:18   MR ANGIO HEAD WO CONTRAST  Result Date: 03/25/2022 CLINICAL DATA:  Hemorrhagic stroke EXAM: MRI HEAD WITHOUT AND WITH CONTRAST MRA HEAD WITHOUT CONTRAST TECHNIQUE: Multiplanar, multi-echo pulse sequences of the brain and surrounding structures were acquired without and with intravenous contrast. Angiographic images of the Circle of Willis were acquired using MRA technique without intravenous contrast. CONTRAST:  6.54m GADAVIST GADOBUTROL 1 MMOL/ML IV SOLN COMPARISON:  Correlation made with recent CT imaging FINDINGS: MRI HEAD FINDINGS Motion artifact is present. Brain: Acute hemorrhage is again identified centered within the left thalamus  with surrounding edema and intraventricular extension. There is mild mass effect. There is no abnormal enhancement within the above limitation. Patchy and confluent areas of T2 hyperintensity in the supratentorial and pontine white matter are nonspecific but probably reflect mild to moderate chronic microvascular ischemic changes. There is no acute infarction. No evidence of chronic hemorrhage. There is no hydrocephalus or extra-axial fluid collection. Vascular: Major vessel flow voids at the skull base are preserved. Skull and upper  cervical spine: Normal marrow signal is preserved. Sinuses/Orbits: Paranasal sinuses are aerated. Bilateral lens replacements. Other: Sella is partially empty.  Mastoid air cells are clear. MRA HEAD FINDINGS Motion artifact is present. Anterior circulation: Intracranial internal carotid arteries are patent. Anterior and middle cerebral arteries are patent. No significant stenosis identified within the above limitation. Posterior circulation: Intracranial vertebral arteries, basilar artery, and posterior cerebral arteries are patent. Left posterior communicating artery is identified. No significant stenosis identified within the above limitation. No flow related enhancement in the area of hemorrhage. IMPRESSION: Motion artifact present. Evolving recent left thalamic hemorrhage with intraventricular extension and mild edema and mass effect. No evidence of underlying lesion. No significant vascular abnormality. Chronic microvascular ischemic changes. Electronically Signed   By: Macy Mis M.D.   On: 03/25/2022 09:31   MR BRAIN W WO CONTRAST  Result Date: 03/25/2022 CLINICAL DATA:  Hemorrhagic stroke EXAM: MRI HEAD WITHOUT AND WITH CONTRAST MRA HEAD WITHOUT CONTRAST TECHNIQUE: Multiplanar, multi-echo pulse sequences of the brain and surrounding structures were acquired without and with intravenous contrast. Angiographic images of the Circle of Willis were acquired using MRA technique without intravenous contrast. CONTRAST:  6.47m GADAVIST GADOBUTROL 1 MMOL/ML IV SOLN COMPARISON:  Correlation made with recent CT imaging FINDINGS: MRI HEAD FINDINGS Motion artifact is present. Brain: Acute hemorrhage is again identified centered within the left thalamus with surrounding edema and intraventricular extension. There is mild mass effect. There is no abnormal enhancement within the above limitation. Patchy and confluent areas of T2 hyperintensity in the supratentorial and pontine white matter are nonspecific but  probably reflect mild to moderate chronic microvascular ischemic changes. There is no acute infarction. No evidence of chronic hemorrhage. There is no hydrocephalus or extra-axial fluid collection. Vascular: Major vessel flow voids at the skull base are preserved. Skull and upper cervical spine: Normal marrow signal is preserved. Sinuses/Orbits: Paranasal sinuses are aerated. Bilateral lens replacements. Other: Sella is partially empty.  Mastoid air cells are clear. MRA HEAD FINDINGS Motion artifact is present. Anterior circulation: Intracranial internal carotid arteries are patent. Anterior and middle cerebral arteries are patent. No significant stenosis identified within the above limitation. Posterior circulation: Intracranial vertebral arteries, basilar artery, and posterior cerebral arteries are patent. Left posterior communicating artery is identified. No significant stenosis identified within the above limitation. No flow related enhancement in the area of hemorrhage. IMPRESSION: Motion artifact present. Evolving recent left thalamic hemorrhage with intraventricular extension and mild edema and mass effect. No evidence of underlying lesion. No significant vascular abnormality. Chronic microvascular ischemic changes. Electronically Signed   By: PMacy MisM.D.   On: 03/25/2022 09:31   ECHOCARDIOGRAM COMPLETE  Result Date: 03/25/2022    ECHOCARDIOGRAM REPORT   Patient Name:   [name] KIERNANDate of Exam: 03/25/2022 Medical Rec #:  [id number]      Height:       66.0 in Accession #:    [id number]     Weight:       145.5 lb Date of Birth:  503-26-1924  BSA:          1.747 m Patient Age:    57 years        BP:           146/87 mmHg Patient Gender: F               HR:           75 bpm. Exam Location:  Inpatient Procedure: 2D Echo, Cardiac Doppler and Color Doppler Indications:    Stroke  History:        Patient has prior history of Echocardiogram examinations, most                 recent 05/12/2020.  Arrythmias:Atrial Fibrillation.  Sonographer:    Joette Catching RCS Referring Phys: [id number] [place]  1. Left ventricular ejection fraction, by estimation, is 55 to 60%. The left ventricle has normal function. The left ventricle has no regional wall motion abnormalities. There is mild concentric left ventricular hypertrophy. Left ventricular diastolic parameters are indeterminate. Elevated left ventricular end-diastolic pressure.  2. Right ventricular systolic function is normal. The right ventricular size is normal.  3. Left atrial size was mildly dilated.  4. The mitral valve is normal in structure. No evidence of mitral valve regurgitation. No evidence of mitral stenosis.  5. The aortic valve is normal in structure. Aortic valve regurgitation is trivial. Aortic valve sclerosis is present, with no evidence of aortic valve stenosis. Aortic regurgitation PHT measures 519 msec.  6. There is dilatation of the ascending aorta, measuring 39 mm.  7. The inferior vena cava is normal in size with greater than 50% respiratory variability, suggesting right atrial pressure of 3 mmHg. FINDINGS  Left Ventricle: Left ventricular ejection fraction, by estimation, is 55 to 60%. The left ventricle has normal function. The left ventricle has no regional wall motion abnormalities. The left ventricular internal cavity size was normal in size. There is  mild concentric left ventricular hypertrophy. Left ventricular diastolic parameters are indeterminate. Elevated left ventricular end-diastolic pressure. Right Ventricle: The right ventricular size is normal. No increase in right ventricular wall thickness. Right ventricular systolic function is normal. Left Atrium: Left atrial size was mildly dilated. Right Atrium: Right atrial size was normal in size. Pericardium: There is no evidence of pericardial effusion. Mitral Valve: The mitral valve is normal in structure. Mild mitral annular calcification. No evidence of  mitral valve regurgitation. No evidence of mitral valve stenosis. Tricuspid Valve: The tricuspid valve is normal in structure. Tricuspid valve regurgitation is not demonstrated. No evidence of tricuspid stenosis. Aortic Valve: The aortic valve is normal in structure. Aortic valve regurgitation is trivial. Aortic regurgitation PHT measures 519 msec. Aortic valve sclerosis is present, with no evidence of aortic valve stenosis. Aortic valve mean gradient measures 8.0 mmHg. Aortic valve peak gradient measures 14.0 mmHg. Aortic valve area, by VTI measures 3.05 cm. Pulmonic Valve: The pulmonic valve was not well visualized. Pulmonic valve regurgitation is trivial. No evidence of pulmonic stenosis. Aorta: The aortic root is normal in size and structure. There is dilatation of the ascending aorta, measuring 39 mm. Venous: The inferior vena cava is normal in size with greater than 50% respiratory variability, suggesting right atrial pressure of 3 mmHg. IAS/Shunts: No atrial level shunt detected by color flow Doppler.  LEFT VENTRICLE PLAX 2D LVIDd:         5.40 cm   Diastology LVIDs:         3.40 cm   LV  e' medial:    4.14 cm/s LV PW:         0.90 cm   LV E/e' medial:  24.4 LV IVS:        1.20 cm   LV e' lateral:   4.30 cm/s LVOT diam:     2.20 cm   LV E/e' lateral: 23.5 LV SV:         105 LV SV Index:   60 LVOT Area:     3.80 cm  RIGHT VENTRICLE             IVC RV Basal diam:  3.00 cm     IVC diam: 1.60 cm RV Mid diam:    2.80 cm RV S prime:     18.60 cm/s TAPSE (M-mode): 2.6 cm LEFT ATRIUM             Index        RIGHT ATRIUM           Index LA diam:        3.00 cm 1.72 cm/m   RA Area:     13.90 cm LA Vol (A2C):   62.0 ml 35.49 ml/m  RA Volume:   27.50 ml  15.74 ml/m LA Vol (A4C):   52.7 ml 30.17 ml/m LA Biplane Vol: 62.3 ml 35.66 ml/m  AORTIC VALVE                     PULMONIC VALVE AV Area (Vmax):    2.85 cm      PV Vmax:          0.90 m/s AV Area (Vmean):   2.69 cm      PV Peak grad:     3.2 mmHg AV Area  (VTI):     3.05 cm      PR End Diast Vel: 9.73 msec AV Vmax:           187.00 cm/s AV Vmean:          132.000 cm/s AV VTI:            0.343 m AV Peak Grad:      14.0 mmHg AV Mean Grad:      8.0 mmHg LVOT Vmax:         140.00 cm/s LVOT Vmean:        93.500 cm/s LVOT VTI:          0.275 m LVOT/AV VTI ratio: 0.80 AI PHT:            519 msec  AORTA Ao Root diam: 3.10 cm Ao Asc diam:  3.90 cm MITRAL VALVE                TRICUSPID VALVE MV Area (PHT): 3.65 cm     TR Peak grad:   19.5 mmHg MV Decel Time: 208 msec     TR Vmax:        221.00 cm/s MV E velocity: 101.00 cm/s MV A velocity: 120.00 cm/s  SHUNTS MV E/A ratio:  0.84         Systemic VTI:  0.28 m                             Systemic Diam: 2.20 cm Kardie Tobb DO Electronically signed by Berniece Salines DO Signature Date/Time: 03/25/2022/12:32:43 PM    Final    CT HEAD CODE STROKE WO CONTRAST  Result Date: 03/25/2022 CLINICAL  DATA:  Code stroke. Initial evaluation for acute neuro deficit, right-sided deficits, slurred speech. EXAM: CT HEAD WITHOUT CONTRAST TECHNIQUE: Contiguous axial images were obtained from the base of the skull through the vertex without intravenous contrast. RADIATION DOSE REDUCTION: This exam was performed according to the departmental dose-optimization program which includes automated exposure control, adjustment of the mA and/or kV according to patient size and/or use of iterative reconstruction technique. COMPARISON:  Prior CT from 12/02/2020. FINDINGS: Brain: Acute intraparenchymal hemorrhage centered at the left thalamus measures 2.2 x 2.2 x 2.6 cm (estimated volume 6 mL). Mild localized edema without significant regional mass effect or midline shift. Associated intraventricular extension with trace intraventricular blood within the lateral ventricles. No hydrocephalus or trapping. No other acute intracranial hemorrhage. No other acute large vessel territory infarct. No mass lesion or significant midline shift. No hydrocephalus. No  extra-axial fluid collection. Atrophy with moderate chronic microvascular ischemic disease. Vascular: No hyperdense vessel. Skull: Scalp soft tissues and calvarium within normal limits. Sinuses/Orbits: Globes and orbital soft tissues demonstrate no acute finding. Paranasal sinuses and mastoid air cells are largely clear. Other: None. ASPECTS [facility] Stroke Program Early CT Score) Does not apply, acute ICH. IMPRESSION: 1. 2.2 x 2.2 x 2.6 cm acute intraparenchymal hemorrhage centered at the left thalamus, estimated volume 6 mL. Mild localized edema without significant regional mass effect or midline shift. 2. Associated intraventricular extension with trace IVH within both lateral ventricles. No hydrocephalus or trapping. 3. Underlying atrophy with chronic small vessel ischemic disease. These results were communicated to Dr. Lorrin Goodell at 2:44 am on 03/25/2022 by text page via the [facility] messaging system. Electronically Signed   By: Jeannine Boga M.D.   On: 03/25/2022 02:46   VAS US CAROTID  Result Date: 03/28/2022 Carotid Arterial Duplex Study Patient Name:  [name]  Date of Exam:   03/25/2022 Medical Rec #: [id number]        Accession #:    [id number] Date of Birth: May 07, 1923         Patient Gender: F Patient Age:   14 years Exam Location:  [facility] Procedure:      VAS US CAROTID Referring Phys: Cornelius Moras XU --------------------------------------------------------------------------------  Indications:       CVA, Speech disturbance and Weakness. Risk Factors:      Hypertension, PAD. Other Factors:     Atrial fibrillation on Eliquis. Comparison Study:  No prior study on file Performing Technologist: Sharion Dove RVS  Examination Guidelines: A complete evaluation includes B-mode imaging, spectral Doppler, color Doppler, and power Doppler as needed of all accessible portions of each vessel. Bilateral testing is considered an integral part of a complete examination. Limited examinations for  reoccurring indications may be performed as noted.  Right Carotid Findings: +----------+--------+--------+--------+------------------+------------------+           PSV cm/sEDV cm/sStenosisPlaque DescriptionComments           +----------+--------+--------+--------+------------------+------------------+ CCA Prox  99      15                                intimal thickening +----------+--------+--------+--------+------------------+------------------+ CCA Distal79      16                                intimal thickening +----------+--------+--------+--------+------------------+------------------+ ICA Prox  70      14  heterogenous                         +----------+--------+--------+--------+------------------+------------------+ ICA Distal95      18                                                   +----------+--------+--------+--------+------------------+------------------+ ECA       67      2                                                    +----------+--------+--------+--------+------------------+------------------+ +----------+--------+-------+--------+-------------------+           PSV cm/sEDV cmsDescribeArm Pressure (mmHG) +----------+--------+-------+--------+-------------------+ [id number]                                         +----------+--------+-------+--------+-------------------+ +---------+--------+--+--------+--+ VertebralPSV cm/s75EDV cm/s12 +---------+--------+--+--------+--+  Left Carotid Findings: +----------+--------+--------+--------+------------------+------------------+           PSV cm/sEDV cm/sStenosisPlaque DescriptionComments           +----------+--------+--------+--------+------------------+------------------+ CCA Prox  88      12                                intimal thickening +----------+--------+--------+--------+------------------+------------------+ CCA Distal76      16                                 intimal thickening +----------+--------+--------+--------+------------------+------------------+ ICA Prox  67      16              calcific                             +----------+--------+--------+--------+------------------+------------------+ ICA Distal79      15                                                   +----------+--------+--------+--------+------------------+------------------+ ECA       68      7                                                    +----------+--------+--------+--------+------------------+------------------+ +----------+--------+--------+--------+-------------------+           PSV cm/sEDV cm/sDescribeArm Pressure (mmHG) +----------+--------+--------+--------+-------------------+ [id number]                                         +----------+--------+--------+--------+-------------------+ +---------+--------+--+--------+--+ VertebralPSV cm/s83EDV cm/s17 +---------+--------+--+--------+--+   Summary: Right Carotid: Velocities in the right ICA are consistent with a 1-39% stenosis. Left Carotid: Velocities in the left ICA are consistent with  a 1-39% stenosis. Vertebrals:  Bilateral vertebral arteries demonstrate antegrade flow. Subclavians: Normal flow hemodynamics were seen in bilateral subclavian              arteries. *See table(s) above for measurements and observations.  Electronically signed by Antony Contras MD on 03/28/2022 at 8:38:20 AM.    Final       Subjective: Patient seen and examined at bedside this morning.  Hemodynamically stable for discharge today.  Discharge Exam: Vitals:   04/02/22 1200 04/02/22 1421  BP: 100/65 (!) 116/51  Pulse: 68 66  Resp: 20   Temp: (!) 97.4 F (36.3 C)   SpO2:     Vitals:   04/02/22 0746 04/02/22 1011 04/02/22 1200 04/02/22 1421  BP: 124/62 126/63 100/65 (!) 116/51  Pulse: 73 66 68 66  Resp: '20 18 20   '$ Temp: 98.4 F (36.9 C)  (!) 97.4 F (36.3 C)   TempSrc: Oral   Axillary   SpO2: 97% 99%    Weight:      Height:        General: Pt is alert, awake, not in acute distress Cardiovascular: RRR, S1/S2 +, no rubs, no gallops Respiratory: CTA bilaterally, no wheezing, no rhonchi Abdominal: Soft, NT, ND, bowel sounds + Extremities: no edema, no cyanosis, right hemiparesis    The results of significant diagnostics from this hospitalization (including imaging, microbiology, ancillary and laboratory) are listed below for reference.     Microbiology: Recent Results (from the past 240 hour(s))  MRSA Next Gen by PCR, Nasal     Status: None   Collection Time: 03/25/22  4:11 AM   Specimen: Nasal Mucosa; Nasal Swab  Result Value Ref Range Status   MRSA by PCR Next Gen NOT DETECTED NOT DETECTED Final    Comment: (NOTE) The GeneXpert MRSA Assay (FDA approved for NASAL specimens only), is one component of a comprehensive MRSA colonization surveillance program. It is not intended to diagnose MRSA infection nor to guide or monitor treatment for MRSA infections. Test performance is not FDA approved in patients less than 17 years old. Performed at [facility], [place] [address], [place], [place] [address]   Urine Culture     Status: Abnormal   Collection Time: 03/29/22  3:50 PM   Specimen: Urine, Catheterized  Result Value Ref Range Status   Specimen Description URINE, CATHETERIZED  Final   Special Requests   Final    NONE Performed at [facility], [address], [place], [place] [address]    Culture >=100,000 COLONIES/mL PROTEUS MIRABILIS (A)  Final   Report Status 03/31/2022 FINAL  Final   Organism ID, Bacteria PROTEUS MIRABILIS (A)  Final      Susceptibility   Proteus mirabilis - MIC*    AMPICILLIN <=2 SENSITIVE Sensitive     CEFAZOLIN <=4 SENSITIVE Sensitive     CEFEPIME <=0.12 SENSITIVE Sensitive     CEFTRIAXONE <=0.25 SENSITIVE Sensitive     CIPROFLOXACIN <=0.25 SENSITIVE Sensitive     GENTAMICIN <=1 SENSITIVE Sensitive      IMIPENEM 2 SENSITIVE Sensitive     NITROFURANTOIN RESISTANT Resistant     TRIMETH/SULFA <=20 SENSITIVE Sensitive     AMPICILLIN/SULBACTAM <=2 SENSITIVE Sensitive     PIP/TAZO <=4 SENSITIVE Sensitive     * >=100,000 COLONIES/mL PROTEUS MIRABILIS     Labs: BNP (last 3 results) No results for input(s): BNP in the last 8760 hours. Basic Metabolic Panel: Recent Labs  Lab 03/27/22 0239 03/28/22 0427  03/29/22 0259 03/30/22 0248 03/31/22 1142  NA 140 140 139 140 137  K 4.3 3.5 3.6 3.7 4.0  CL 110 112* 108 111 110  CO2 '24 23 22 '$ 21* 22  GLUCOSE 107* 97 94 109* 107*  BUN 13 22 33* 32* 25*  CREATININE 0.80 1.01* 1.40* 1.20* 1.04*  CALCIUM 9.2 9.2 9.0 8.8* 8.4*  MG  --   --   --   --  1.9   Liver Function Tests: No results for input(s): AST, ALT, ALKPHOS, BILITOT, PROT, ALBUMIN in the last 168 hours. No results for input(s): LIPASE, AMYLASE in the last 168 hours. No results for input(s): AMMONIA in the last 168 hours. CBC: Recent Labs  Lab 03/27/22 0239 03/28/22 0427 03/29/22 0259 03/30/22 0248  WBC 9.8 9.0 13.0* 10.7*  HGB 11.6* 10.8* 10.5* 10.0*  HCT 36.1 34.3* 32.4* 30.7*  MCV 90.5 90.5 89.5 89.0  PLT 264 257 251 233   Cardiac Enzymes: No results for input(s): CKTOTAL, CKMB, CKMBINDEX, TROPONINI in the last 168 hours. BNP: Invalid input(s): POCBNP CBG: Recent Labs  Lab 03/31/22 1927  GLUCAP 111*   D-Dimer No results for input(s): DDIMER in the last 72 hours. Hgb A1c No results for input(s): HGBA1C in the last 72 hours. Lipid Profile No results for input(s): CHOL, HDL, LDLCALC, TRIG, CHOLHDL, LDLDIRECT in the last 72 hours. Thyroid function studies No results for input(s): TSH, T4TOTAL, T3FREE, THYROIDAB in the last 72 hours.  Invalid input(s): FREET3 Anemia work up No results for input(s): VITAMINB12, FOLATE, FERRITIN, TIBC, IRON, RETICCTPCT in the last 72 hours. Urinalysis    Component Value Date/Time   COLORURINE YELLOW (A) 03/29/2022 0950    APPEARANCEUR TURBID (A) 03/29/2022 0950   LABSPEC RESULTS UNAVAILABLE DUE TO INTERFERING SUBSTANCE 03/29/2022 0950   PHURINE RESULTS UNAVAILABLE DUE TO INTERFERING SUBSTANCE 03/29/2022 0950   GLUCOSEU RESULTS UNAVAILABLE DUE TO INTERFERING SUBSTANCE (A) 03/29/2022 0950   GLUCOSEU NEGATIVE 02/22/2016 0926   HGBUR RESULTS UNAVAILABLE DUE TO INTERFERING SUBSTANCE (A) 03/29/2022 0950   BILIRUBINUR RESULTS UNAVAILABLE DUE TO INTERFERING SUBSTANCE (A) 03/29/2022 0950   BILIRUBINUR positive 12/10/2021 1134   KETONESUR RESULTS UNAVAILABLE DUE TO INTERFERING SUBSTANCE (A) 03/29/2022 0950   PROTEINUR RESULTS UNAVAILABLE DUE TO INTERFERING SUBSTANCE (A) 03/29/2022 0950   UROBILINOGEN 4.0 (A) 12/10/2021 1134   UROBILINOGEN 0.2 02/22/2016 0926   NITRITE RESULTS UNAVAILABLE DUE TO INTERFERING SUBSTANCE (A) 03/29/2022 0950   LEUKOCYTESUR RESULTS UNAVAILABLE DUE TO INTERFERING SUBSTANCE (A) 03/29/2022 0950   Sepsis Labs Invalid input(s): PROCALCITONIN,  WBC,  LACTICIDVEN Microbiology Recent Results (from the past 240 hour(s))  MRSA Next Gen by PCR, Nasal     Status: None   Collection Time: 03/25/22  4:11 AM   Specimen: Nasal Mucosa; Nasal Swab  Result Value Ref Range Status   MRSA by PCR Next Gen NOT DETECTED NOT DETECTED Final    Comment: (NOTE) The GeneXpert MRSA Assay (FDA approved for NASAL specimens only), is one component of a comprehensive MRSA colonization surveillance program. It is not intended to diagnose MRSA infection nor to guide or monitor treatment for MRSA infections. Test performance is not FDA approved in patients less than 60 years old. Performed at [facility], [place] [address], [place], [place] [address]   Urine Culture     Status: Abnormal   Collection Time: 03/29/22  3:50 PM   Specimen: Urine, Catheterized  Result Value Ref Range Status   Specimen Description URINE, CATHETERIZED  Final   Special Requests  Final    NONE Performed at [facility], [place] [address], [place],  [address]    Culture >=100,000 COLONIES/mL PROTEUS MIRABILIS (A)  Final   Report Status 03/31/2022 FINAL  Final   Organism ID, Bacteria PROTEUS MIRABILIS (A)  Final      Susceptibility   Proteus mirabilis - MIC*    AMPICILLIN <=2 SENSITIVE Sensitive     CEFAZOLIN <=4 SENSITIVE Sensitive     CEFEPIME <=0.12 SENSITIVE Sensitive     CEFTRIAXONE <=0.25 SENSITIVE Sensitive     CIPROFLOXACIN <=0.25 SENSITIVE Sensitive     GENTAMICIN <=1 SENSITIVE Sensitive     IMIPENEM 2 SENSITIVE Sensitive     NITROFURANTOIN RESISTANT Resistant     TRIMETH/SULFA <=20 SENSITIVE Sensitive     AMPICILLIN/SULBACTAM <=2 SENSITIVE Sensitive     PIP/TAZO <=4 SENSITIVE Sensitive     * >=100,000 COLONIES/mL PROTEUS MIRABILIS    Please note: You were cared for by a hospitalist during your hospital stay. Once you are discharged, your primary care physician will handle any further medical issues. Please note that NO REFILLS for any discharge medications will be authorized once you are discharged, as it is imperative that you return to your primary care physician (or establish a relationship with a primary care physician if you do not have one) for your post hospital discharge needs so that they can reassess your need for medications and monitor your lab values.    Time coordinating discharge: 40 minutes  SIGNED:   Shelly Coss, MD  [facility] 04/02/2022, 3:47 PM Pager [phone]  If 7PM-7AM, please contact night-coverage [email or web address] Password [email or web address]

## 2022-04-02 NOTE — Progress Notes (Signed)
Patient discharged with PIV and Foley intact. Report given to [facility] RN at [facility].

## 2022-04-02 NOTE — H&P (Signed)
Physical Medicine and Rehabilitation Admission H&P    CC:  Functional deficits secondary to left thalamic intracranial hemorrhage  HPI: [name] is a 88 year old female who presented to the emergency department via EMS with right-sided weakness, aphasia, speech apraxia and slurred speech.  Code stroke was activated.  Imaging confirmed left basal ganglia intracranial hemorrhage with trace intraventricular hemorrhage and was in hypertensive emergency on arrival.  She has a history of atrial fibrillation on apixaban therapy.  This was reversed with Andexxa.  She was admitted to the ICU and given labetalol and Cleviprex to keep systolic blood pressure within 130-150.  After swallow evaluation on 5/15, she was started on oral antihypertensives.  She was transferred out of the ICU on 5/16.  Her aphasia was improving but did fluctuate with fatigue.  Serial CTs performed.  She remained hemodynamically stable.  She exhibited increased confusion on 5/19 and urinalysis and culture were obtained.  Ceftriaxone initiated.  She developed urinary retention and Foley was placed, started on tamsulosin and Foley discontinued.  Culture was positive for Proteus and antibiotics were adjusted to Keflex.  Monitored on telemetry and her heart rate was controlled with Cardizem and nadolol.  Neurology recommends resumption of anticoagulation after resolution of her IPH and follow-up as outpatient. The patient requires inpatient medicine and rehabilitation evaluations and services for ongoing dysfunction secondary to left thalamic intracranial hemorrhage.  Spoke with daughter and son as patient being transferred. She has been more somnolent and confused today. She was more alert and engaged yesterday. There was possibility of panic yesterday and daughter believes the chest discomfort could have been acid reflux as she has GERD. Will discontinue Xanax prn.  Review of Systems  Unable to perform ROS: Language  Past Medical  History:  Diagnosis Date   Closed fracture of left distal radius 12/29/2019   DVT (deep venous thrombosis) ([place]) 09/2015   RLE   Dysrhythmia    Afib   GERD (gastroesophageal reflux disease)    Hip fracture ([place]) 12/02/2020   Hypertension    Hypothyroidism    Melanoma ([place]) 06/16/2017   Facial melanoma, removed by Dr. Harvel Quale   Peripheral vascular disease [facility])    peripheral neuropathy   Past Surgical History:  Procedure Laterality Date   ABDOMINAL HYSTERECTOMY     APPENDECTOMY     BACK SURGERY     BREAST SURGERY     left breast biopsy   CARDIOVERSION N/A 05/09/2020   Procedure: CARDIOVERSION;  Surgeon: Skeet Latch, MD;  Location: [place];  Service: Cardiovascular;  Laterality: N/A;   COLOSTOMY N/A 01/06/2020   Procedure: Colostomy;  Surgeon: Greer Pickerel, MD;  Location: [place];  Service: General;  Laterality: N/A;   EYE SURGERY     cataract   HARDWARE REMOVAL Left 01/01/2020   Procedure: HARDWARE REMOVAL;  Surgeon: Iran Planas, MD;  Location: [place];  Service: Orthopedics;  Laterality: Left;   HIP ARTHROPLASTY Left 12/03/2020   Procedure: ARTHROPLASTY BIPOLAR HIP (HEMIARTHROPLASTY);  Surgeon: Renette Butters, MD;  Location: [place];  Service: Orthopedics;  Laterality: Left;   HIP ARTHROPLASTY Left    partial   IR KYPHO EA ADDL LEVEL THORACIC OR LUMBAR  06/27/2021   IR RADIOLOGIST EVAL & MGMT  07/13/2021   LAPAROTOMY N/A 01/06/2020   Procedure: Exploratory Laparotomy, Open Sigmoid colectomy;  Surgeon: Greer Pickerel, MD;  Location: [place];  Service: General;  Laterality: N/A;   LIPOMA EXCISION Left 04/25/2015   Procedure: EXCISION OF LIPOMA LEFT ARM;  Surgeon: Donnie Mesa, MD;  Location: [place];  Service: General;  Laterality: Left;   LUMBAR LAMINECTOMY/DECOMPRESSION MICRODISCECTOMY  11/20/2011   Procedure: LUMBAR LAMINECTOMY/DECOMPRESSION MICRODISCECTOMY;  Surgeon: Laurice Record [place];  Location: [facility];  Service: Orthopedics;  Laterality: N/A;  Decompressive  Laminectomy L2 to the Sacrum (X-Ray)   LYSIS OF ADHESION N/A 01/17/2021   Procedure: RIGID PROCTOSCOPY;  Surgeon: Michael Boston, MD;  Location: [facility];  Service: General;  Laterality: N/A;   OPEN REDUCTION INTERNAL FIXATION (ORIF) DISTAL RADIAL FRACTURE Left 01/01/2020   Procedure: OPEN REDUCTION INTERNAL FIXATION (ORIF) DISTAL RADIAL FRACTURE;  Surgeon: Iran Planas, MD;  Location: [place];  Service: Orthopedics;  Laterality: Left;   OTHER SURGICAL HISTORY     left wrist surgery - has plate in left wrist    OTHER SURGICAL HISTORY     right knee surgery due to torn cartilage    TONSILLECTOMY     WRIST FRACTURE SURGERY Left    XI ROBOTIC ASSISTED COLOSTOMY TAKEDOWN N/A 01/17/2021   Procedure: XI ROBOTIC ASSISTED OSTOMY TAKEDOWN, LYSIS OF ADHESIONS, RECTOSIGMOID RESECTION, BILATERAL TAP BLOCK;  Surgeon: Michael Boston, MD;  Location: [facility];  Service: General;  Laterality: N/A;   Family History  Problem Relation Age of Onset   Cancer Mother        BREAST   COPD Father    Emphysema Father    Cancer Daughter        breast   Social History:  reports that she has never smoked. She has never used smokeless tobacco. She reports that she does not drink alcohol and does not use drugs. Allergies:  Allergies  Allergen Reactions   Keflex [Cephalexin] Nausea Only and Other (See Comments)    Pt ended up in ER w/ CHEST PAIN   Codeine Other (See Comments)    "Nightmares, imagined things"   Medications Prior to Admission  Medication Sig Dispense Refill   acetaminophen (TYLENOL) 500 MG tablet Take 500 mg by mouth at bedtime.     apixaban (ELIQUIS) 5 MG TABS tablet Take 1 tablet (5 mg total) by mouth 2 (two) times daily. 180 tablet 3   diltiazem (CARDIZEM CD) 120 MG 24 hr capsule TAKE 1 CAPSULE BY MOUTH EVERY DAY (Patient taking differently: Take 120 mg by mouth every evening.) 30 capsule 0   docusate sodium (COLACE) 100 MG capsule Take 1 capsule (100 mg total) by mouth 2 (two) times daily. 60 capsule 0    gabapentin (NEURONTIN) 300 MG capsule TAKE 1 CAPSULE BY MOUTH EVERYDAY AT BEDTIME (Patient taking differently: Take 300 mg by mouth at bedtime.) 90 capsule 1   levothyroxine (SYNTHROID) 50 MCG tablet TAKE 1 TABLET BY MOUTH EVERY DAY (Patient taking differently: Take 50 mcg by mouth daily before breakfast.) 90 tablet 1   losartan (COZAAR) 50 MG tablet Take 2 tablets (100 mg total) by mouth daily. 180 tablet 1   Multiple Vitamins-Minerals (PRESERVISION AREDS 2 PO) Take 1 tablet by mouth daily with lunch.     nadolol (CORGARD) 80 MG tablet Take 1 tablet (80 mg total) by mouth daily. 90 tablet 1   pantoprazole (PROTONIX) 40 MG tablet TAKE 1 TABLET BY MOUTH EVERY DAY (Patient taking differently: Take 40 mg by mouth daily.) 90 tablet 1   predniSONE (DELTASONE) 1 MG tablet Take 4 tablets (4 mg total) by mouth daily with lunch. (Patient taking differently: Take 2 mg by mouth daily with lunch.)     Vitamin D, Ergocalciferol, (DRISDOL) 1.25 MG (  50000 UNIT) CAPS capsule Take 1 capsule (50,000 Units total) by mouth every 7 (seven) days. (Patient taking differently: Take 50,000 Units by mouth every 7 (seven) days. No set day) 12 capsule 0      Home: Home Living Family/patient expects to be discharged to:: Private residence Living Arrangements: Alone Available Help at Discharge: Personal care attendant, Family, Available PRN/intermittently Type of Home: House Home Access: Stairs to enter [employer] of Steps: 2 Entrance Stairs-Rails: Right, Left, Can reach both Home Layout: One level Bathroom Shower/Tub: Multimedia programmer: Handicapped height Bathroom Accessibility: Yes Home Equipment: Conservation officer, nature (2 wheels), Grab bars - tub/shower, BSC/3in1, Cane - single point Additional Comments: caregiver comes 4 days/ wk, 9-3p. Numerous family members live within 2 mi and are alerted whenever she pushed life alert button as she did this time  Lives With: Alone   Functional  History: Prior Function Prior Level of Function : Needs assist Mobility Comments: was ambulating independently with RW ADLs Comments: caregiver present for bathing but not physically assisting  Functional Status:  Mobility: Bed Mobility Overal bed mobility: Needs Assistance Bed Mobility: Supine to Sit Rolling: Mod assist Sidelying to sit: Max assist Supine to sit: Mod assist, HOB elevated, +2 for safety/equipment Sit to supine: +2 for physical assistance, Total assist General bed mobility comments: pt able to bring LEs over towards EOB with directional verbal cues and minA for R LE, modA for trunk elevation to EOB Transfers Overall transfer level: Needs assistance Equipment used: 2 person hand held assist (face to face transfer) Transfers: Bed to chair/wheelchair/BSC, Sit to/from Stand Sit to Stand: +2 physical assistance, Max assist Bed to/from chair/wheelchair/BSC transfer type:: Step pivot Squat pivot transfers: Max assist Step pivot transfers: Max assist, +2 physical assistance General transfer comment: maxA x2 to power up with R knee blocked. pt with improved stepping ability with L LE, requires maxA to advance R LE Ambulation/Gait General Gait Details: unable    ADL: ADL Overall ADL's : Needs assistance/impaired Eating/Feeding: NPO Grooming: Wash/dry face, Wash/dry hands, Standing Grooming Details (indicate cue type and reason): completed standing at sink with LUE, WB through [employer] Transfer: Moderate assistance, Maximal assistance, +2 for physical assistance, Stand-pivot, BSC/3in1 Toilet Transfer Details (indicate cue type and reason): simulated to chair Functional mobility during ADLs: Maximal assistance, Moderate assistance, +2 for physical assistance General ADL Comments: edcuated son on benefits of allowing pt. to attempt familiar tasks before assisting her with them  Cognition: Cognition Overall Cognitive Status: Impaired/Different from  baseline Arousal/Alertness: Lethargic Orientation Level: Oriented to person Awareness: Appears intact Cognition Arousal/Alertness: Awake/alert Behavior During Therapy: Flat affect Overall Cognitive Status: Impaired/Different from baseline Area of Impairment: Attention, Following commands, Safety/judgement, Awareness, Problem solving Orientation Level: Disoriented to, Place, Situation, Time Current Attention Level: Sustained Following Commands: Follows one step commands with increased time Safety/Judgement: Decreased awareness of safety, Decreased awareness of deficits (R inattention) Awareness: Intellectual Problem Solving: Slow processing, Requires verbal cues, Difficulty sequencing, Requires tactile cues General Comments: pt anxious with getting OOB/mobility, pt reporting difficulty breathing, however pt endorses anxiety/nervousness with mobility when asked, SpO2 >98% on 2Lo2 via [place] Difficult to assess due to: Impaired communication  Physical Exam: Blood pressure (!) 116/51, pulse 66, temperature (!) 97.4 F (36.3 C), temperature source Axillary, resp. rate 20, height '5\' 6"'$  (1.676 m), weight 66 kg, SpO2 99 %. Physical Exam Constitutional:      Appearance: Normal appearance.  HENT:     Head: Normocephalic.     Nose: Nose normal.  Mouth/Throat:     Mouth: Mucous membranes are moist.  Eyes:     Pupils: Pupils are equal, round, and reactive to light.  Cardiovascular:     Rate and Rhythm: Normal rate and regular rhythm.  Pulmonary:     Effort: Pulmonary effort is normal.  Abdominal:     Palpations: Abdomen is soft.  Musculoskeletal:     Cervical back: Normal range of motion.  Skin:    General: Skin is warm.  Neurological:     Mental Status: She is alert.     Comments: Pt is alert. Right central 7. Mild dysarthria. Aphasic with delayed naming, can repeat.  RUE 2-3/5. RLE 2/5. No focal sensory deficits. No abnl resting tone.     Results for orders placed or performed  during the hospital encounter of 03/25/22 (from the past 48 hour(s))  Glucose, capillary     Status: Abnormal   Collection Time: 03/31/22  7:27 PM  Result Value Ref Range   Glucose-Capillary 111 (H) 70 - 99 mg/dL    Comment: Glucose reference range applies only to samples taken after fasting for at least 8 hours.   Comment 1 Notify RN    Comment 2 Document in Chart   Troponin I (High Sensitivity)     Status: None   Collection Time: 04/02/22 11:54 AM  Result Value Ref Range   Troponin I (High Sensitivity) 13 <18 ng/L    Comment: (NOTE) Elevated high sensitivity troponin I (hsTnI) values and significant  changes across serial measurements may suggest ACS but many other  chronic and acute conditions are known to elevate hsTnI results.  Refer to the "Links" section for chest pain algorithms and additional  guidance. Performed at [facility], [place] [address], [place], [place] [address]    No results found.    Blood pressure (!) 116/51, pulse 66, temperature (!) 97.4 F (36.3 C), temperature source Axillary, resp. rate 20, height '5\' 6"'$  (1.676 m), weight 66 kg, SpO2 99 %.  Medical Problem List and Plan: 1. Functional deficits secondary to left thalamic intracranial hemorrhage  -patient may shower  -ELOS/Goals: 14-16 days, supervision to min assist 2.  Antithrombotics: -DVT/anticoagulation:  Pharmaceutical: Heparin  -antiplatelet therapy: none 3. Pain Management: Tylenol as needed. Neurontin 300 mg q HS 4. Mood: LCSW to evaluate and provide emotional support  -antipsychotic agents: n/a 5. Neuropsych: This patient is not capable of making decisions on her own behalf. 6. Skin/Wound Care: Routine skin care checks 7. Fluids/Electrolytes/Nutrition: Routine Is and Os and follow-up chemistries  --dysphagia 3 diet with thin liquids 8. Hypertension: continue diltiazem, hydralazine, nadolol. Home losartan held 9: Hyperlipidemia: continue atorvastatin 10: GI prophylaxis: continue  pantoprazole 11: pAF: continue diltiazem. No AC until resolution of ICH, outpt follow-up 12: Hypothyroidism: continue Synthroid 13: Urinary retention: continue Flomax  -monitor output, check PVR's 14: UTI: continue Keflex 15: AKI: BUN and Cr continue downward trend; follow-up BMP  -encourage fluids.  16: Leukocytosis: no fever, downward trend; follow-up CBC 17: Anemia, chronic: no evidence of acute blood loss; follow-up CBC  18: Polymyalgia rheumatica: prednisone 2 mg with lunch 19. Dysphagia: tolerating D3/thin diet  -advance per SLP  Barbie Banner, PA-C 04/02/2022

## 2022-04-02 NOTE — Plan of Care (Signed)
  Problem: Education: Goal: Knowledge of disease or condition will improve 04/02/2022 1600 by Hulen Luster, RN Outcome: Progressing 04/02/2022 1545 by Hulen Luster, RN Outcome: Progressing Goal: Knowledge of secondary prevention will improve (SELECT ALL) 04/02/2022 1600 by Hulen Luster, RN Outcome: Progressing 04/02/2022 1545 by Hulen Luster, RN Outcome: Progressing Goal: Knowledge of patient specific risk factors will improve (INDIVIDUALIZE FOR PATIENT) Outcome: Progressing   Problem: Coping: Goal: Will verbalize positive feelings about self Outcome: Progressing Goal: Will identify appropriate support needs Outcome: Progressing   Problem: Health Behavior/Discharge Planning: Goal: Ability to manage health-related needs will improve Outcome: Progressing   Problem: Self-Care: Goal: Ability to participate in self-care as condition permits will improve Outcome: Progressing Goal: Verbalization of feelings and concerns over difficulty with self-care will improve Outcome: Progressing Goal: Ability to communicate needs accurately will improve Outcome: Progressing   Problem: Nutrition: Goal: Risk of aspiration will decrease Outcome: Progressing Goal: Dietary intake will improve Outcome: Progressing   Problem: Intracerebral Hemorrhage Tissue Perfusion: Goal: Complications of Intracerebral Hemorrhage will be minimized Outcome: Progressing   Problem: Education: Goal: Knowledge of General Education information will improve Description: Including pain rating scale, medication(s)/side effects and non-pharmacologic comfort measures Outcome: Progressing   Problem: Health Behavior/Discharge Planning: Goal: Ability to manage health-related needs will improve Outcome: Progressing   Problem: Clinical Measurements: Goal: Ability to maintain clinical measurements within normal limits will improve Outcome: Progressing Goal: Will remain free from infection Outcome: Progressing Goal: Diagnostic  test results will improve Outcome: Progressing Goal: Respiratory complications will improve Outcome: Progressing Goal: Cardiovascular complication will be avoided Outcome: Progressing   Problem: Activity: Goal: Risk for activity intolerance will decrease Outcome: Progressing   Problem: Nutrition: Goal: Adequate nutrition will be maintained Outcome: Progressing   Problem: Coping: Goal: Level of anxiety will decrease Outcome: Progressing   Problem: Elimination: Goal: Will not experience complications related to bowel motility Outcome: Progressing Goal: Will not experience complications related to urinary retention Outcome: Progressing   Problem: Pain Managment: Goal: General experience of comfort will improve Outcome: Progressing   Problem: Safety: Goal: Ability to remain free from injury will improve Outcome: Progressing   Problem: Skin Integrity: Goal: Risk for impaired skin integrity will decrease Outcome: Progressing

## 2022-04-02 NOTE — Progress Notes (Signed)
PMR Admission Coordinator Pre-Admission Assessment   Patient: [name] is an 88 y.o., female MRN: [id number] DOB: February 28, 1923 Height: _0  (167.6 cm) Weight: 66 kg   Insurance Information HMO:     PPO: yes     PCP:      IPA:      80/20:      OTHER:  PRIMARY: [employer]      Policy#: [id number]      Subscriber: pt CM Name: Mendel Ryder      Phone#: [phone] ext [phone]     Fax#: [phone] Pre-Cert#: [id number] [place] for CIR provided by faxed approval from [facility] with [facility] Medicare with updates due to Hilbert Odor at fax listed above on 5/29 (phone [phone])      Employer:  Benefits:  Phone #: [phone]     Name:  Eff. Date: 11/12/19     Deduct: $0      Out of Pocket Max: $4000 (met $449.22)      Life Max: n/a CIR: $160/day for days 1-10      SNF: 100% Outpatient:      Co-Pay: $20/visit Home Health: 100%      Co-Pay:  DME: 80%     Co-Ins: 20% Providers:  SECONDARY:       Policy#:      Phone#:    Development worker, community:       Phone#:    The Therapist, art Information Summary" for patients in Inpatient Rehabilitation Facilities with attached "Privacy Act [place] Records" was provided and verbally reviewed with: Patient and Family   Emergency Contact Information Contact Information       Name Relation Home Work [place] Daughter [phone]    Anny, [place] [phone]   [phone]    Brandolyn, [place] [phone]               Current Medical History  Patient Admitting Diagnosis: L thalamic/BG ICH   History of Present Illness: Pt is a 88 y/o female with PMH of DVT, HTN, PVD, afib (on eliquis), who presented to [facility] on [facility]15 with c/o R sided weakness, aphasia/apraxia.  Imaging revealed a 54m left basal ganglia ICH with trace IVH with hypertensive emergency on arrival.  Neurology following.  MRI showed evolving left thalamic ICH with small IVH.  MRA no significant vascular abnormality.   [facility] stable hematoma and IVH.  Dopplers unremarkable.  Echo showed EF 55-60%.  A1C 5.9.  Recommendations to stop eliquis due to IPH.  Pt initially required cleviprex, but is not BP manged on home meds with PRN labetalol and hydralazine.  Therapy ongoing and pt was recommended for CIR.    Complete NIHSS TOTAL: 7   [facility] admission coordinator and physician.   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 No   Family History   family history includes COPD in her father; Cancer in her daughter and mother; Emphysema in her father.   Current Medications   Current Facility-Administered Medications:    acetaminophen (TYLENOL) tablet 650 mg, 650 mg, Oral, Q4H PRN, 650 mg at 04/01/22 2051 **OR** acetaminophen (TYLENOL) 160 MG/5ML solution 650 mg, 650 mg, Per Tube, Q4H PRN **OR** acetaminophen (TYLENOL) suppository 650 mg, 650 mg, Rectal, Q4H PRN, Donnetta Simpers, MD   ALPRAZolam Duanne Moron) tablet 0.25 mg, 0.25 mg, Oral, TID PRN, Tawanna Solo, Amrit, MD   atorvastatin (LIPITOR) tablet 20 mg, 20 mg, Oral, Daily, Adhikari, Amrit, MD, 20 mg at 04/02/22 0939   cephALEXin (KEFLEX) capsule 500 mg, 500 mg, Oral, Q8H, Adhikari, Amrit, MD, 500 mg at 04/02/22 1418   chlorhexidine (PERIDEX) 0.12 % solution 15 mL, 15 mL, Mouth Rinse, BID, Rosalin Hawking, MD, 15 mL at 04/02/22 1610   Chlorhexidine Gluconate Cloth 2 % PADS 6 each, 6 each, Topical, Daily, Adhikari,  Amrit, MD, 6 each at 04/02/22 0940   diltiazem (CARDIZEM CD) 24 hr capsule 120 mg, 120 mg, Oral, QPM, Rosalin Hawking, MD, 120 mg at 04/01/22 1755   gabapentin (NEURONTIN) capsule 300 mg, 300 mg, Oral, QHS, Khaliqdina, Salman, MD, 300 mg at 04/01/22 2051   heparin injection 5,000 Units, 5,000 Units, Subcutaneous, Q8H, Rosalin Hawking, MD, 5,000 Units at 04/02/22 1418   hydrALAZINE (APRESOLINE) injection 10-20 mg, 10-20 mg, Intravenous, Q4H PRN, Rosalin Hawking, MD   hydrALAZINE (APRESOLINE) tablet 50 mg, 50 mg, Oral, Q8H, Rosalin Hawking, MD, 50 mg at 04/02/22 1422   labetalol (NORMODYNE) injection 5-20 mg, 5-20 mg, Intravenous, Q2H PRN, Rosalin Hawking, MD, 20 mg at 03/26/22 2016   levothyroxine (SYNTHROID) tablet 50 mcg, 50 mcg, Oral, Daily, Donnetta Simpers, MD, 50 mcg at 04/02/22 9604   MEDLINE mouth rinse, 15 mL, Mouth Rinse, q12n4p, Rosalin Hawking, MD, 15 mL at 04/01/22 1700   nadolol (CORGARD) tablet 80 mg, 80 mg, Oral, Daily, Rosalin Hawking, MD, 80 mg at 04/02/22 5409   nitroGLYCERIN (NITROSTAT) SL tablet 0.4 mg, 0.4 mg, Sublingual, Q5 min PRN, Adhikari, Amrit, MD, 0.4 mg at 04/02/22 1209   pantoprazole (PROTONIX) EC tablet 40 mg, 40 mg, Oral, Daily, Rosalin Hawking, MD, 40 mg at 04/02/22 8119   predniSONE (DELTASONE) tablet 2 mg, 2 mg, Oral, Q lunch, Adhikari, Amrit, MD, 2 mg at 04/02/22 1208   senna-docusate (Senokot-S) tablet 1 tablet, 1 tablet, Oral, BID, Donnetta Simpers, MD, 1 tablet at 04/02/22 0939   tamsulosin (FLOMAX) capsule 0.4 mg, 0.4 mg, Oral, Daily, Adhikari, Amrit, MD, 0.4 mg at 04/02/22 1478   Patients Current Diet:  Diet Order                  DIET DYS 3 Room service appropriate? No; Fluid consistency: Thin  Diet effective 1400                         Precautions / Restrictions Precautions Precautions: Fall Precaution Comments: weakness LLE from back surgery, cartilage damage R knee. Falling has been an issue past few yrs (hip fx, wrist fx) but she has not had any recent  falls Restrictions Weight Bearing Restrictions: No    Has the patient had 2 or more falls or a fall with injury in the past year? No   Prior Activity Level Limited Community (1-2x/wk): living alone, caregivers present 6 hrs/day x4 days/week, mod I with RW (caregivers did IADLs and supervised ADLs),  not driving   Prior Functional Level Self Care: Did the patient need help bathing, dressing, using the toilet or eating? Needed some help   Indoor Mobility: Did the patient need assistance with walking from room to room (with or without device)? Independent   Stairs: Did the patient need assistance with internal or external stairs (with or without device)? Independent   Functional Cognition: Did the patient need help planning regular tasks such as shopping or remembering to take medications? Needed some help   Patient Information Are you of Hispanic, Latino/a,or Spanish origin?: X. Patient unable to respond, A. No, not of Hispanic, Latino/a, or Spanish origin (proxy) What is your race?: X. Patient unable to respond, A. White (proxy) Do you need or want an interpreter to communicate with a doctor or health care staff?: 0. No   Patient's Response To:  Health Literacy and Transportation Is the patient able to respond to health literacy and transportation needs?: No Health Literacy - How often do you need to have someone help you when you read instructions, pamphlets, or other written material from your doctor or pharmacy?: Patient unable to respond In the past 12 months, has lack of transportation kept you from medical appointments or from getting medications?: No (proxy) In the past 12 months, has lack of transportation kept you from meetings, work, or from getting things needed for daily living?: No (proxy)   Home Assistive Devices / Equipment Home Equipment: Conservation officer, nature (2 wheels), Grab bars - tub/shower, BSC/3in1, [place] - single point   Prior Device Use: Indicate devices/aids used by  the patient prior to current illness, exacerbation or injury? Walker   Current Functional Level Cognition   Arousal/Alertness: Lethargic Overall Cognitive Status: Impaired/Different from baseline Difficult to assess due to: Impaired communication Current Attention Level: Sustained Orientation Level: Oriented to person Following Commands: Follows one step commands with increased time Safety/Judgement: Decreased awareness of safety, Decreased awareness of deficits (R inattention) General Comments: pt anxious with getting OOB/mobility, pt reporting difficulty breathing, however pt endorses anxiety/nervousness with mobility when asked, SpO2 >98% on 2Lo2 via [place] Awareness: Appears intact    Extremity Assessment (includes Sensation/Coordination)   Upper Extremity Assessment: RUE deficits/detail RUE Deficits / Details: nearly full range with increased time and cues. ataxic movement. 3/5 grossly RUE Sensation: decreased light touch, decreased proprioception RUE Coordination: decreased fine motor, decreased gross motor  Lower Extremity Assessment: Defer to PT evaluation RLE Deficits / Details: hip flex <3/5, RLE weaker than L RLE Coordination: decreased gross motor     ADLs   Overall ADL's : Needs assistance/impaired Eating/Feeding: NPO Grooming: Wash/dry face, Wash/dry hands, Standing Grooming Details (indicate cue type and reason): completed standing at sink with LUE, WB through [employer] Transfer: Moderate assistance, Maximal assistance, +2 for physical assistance, Stand-pivot, BSC/3in1 Toilet Transfer Details (indicate cue type and reason): simulated to chair Functional mobility during ADLs: Maximal assistance, Moderate assistance, +2 for physical assistance General ADL Comments: edcuated son on benefits of allowing pt. to attempt familiar tasks before assisting her with them     Mobility   Overal bed mobility: Needs Assistance Bed Mobility: Supine to Sit Rolling: Mod  assist Sidelying to sit: Max assist Supine to sit: Mod assist, HOB elevated, +2 for safety/equipment Sit to supine: +2 for physical assistance, Total assist General bed mobility comments: pt able to bring LEs over towards EOB with directional verbal cues and minA for R LE, modA for trunk elevation to EOB     Transfers   Overall  transfer level: Needs assistance Equipment used: 2 person hand held assist (face to face transfer) Transfers: Bed to chair/wheelchair/BSC, Sit to/from Stand Sit to Stand: +2 physical assistance, Max assist Bed to/from chair/wheelchair/BSC transfer type:: Step pivot Squat pivot transfers: Max assist Step pivot transfers: Max assist, +2 physical assistance General transfer comment: maxA x2 to power up with R knee blocked. pt with improved stepping ability with L LE, requires maxA to advance R LE     Ambulation / Gait / Stairs / Emergency planning/management officer   Ambulation/Gait General Gait Details: unable     Posture / Balance Dynamic Sitting Balance Sitting balance - Comments: pt with improved sitting balance this date, able to maintain midline with verbal cues, close min guard Balance Overall balance assessment: Needs assistance Sitting-balance support: Single extremity supported, Feet supported Sitting balance-Leahy Scale: Poor Sitting balance - Comments: pt with improved sitting balance this date, able to maintain midline with verbal cues, close min guard Postural control: Right lateral lean Standing balance support: Single extremity supported Standing balance-Leahy Scale: Zero Standing balance comment: dependent on PT and tech     Special needs/care consideration Diabetic management yes    Previous Home Environment (from acute therapy documentation) Living Arrangements: Alone  Lives With: Alone Available Help at Discharge: Personal care attendant, Family, Available PRN/intermittently Type of Home: House Home Layout: One level Home Access: Stairs to  enter Entrance Stairs-Rails: Right, Left, Can reach both Entrance Stairs-Number of Steps: 2 Bathroom Shower/Tub: Multimedia programmer: Handicapped height Bathroom Accessibility: Yes Additional Comments: caregiver comes 4 days/ wk, 9-3p. Numerous family members live within 2 mi and are alerted whenever she pushed life alert button as she did this time   Discharge Living Setting Plans for Discharge Living Setting: Patient's home Type of Home at Discharge: House Discharge Home Layout: One level Discharge Home Access: Stairs to enter Entrance Stairs-Rails: Can reach both Entrance Stairs-Number of Steps: 2 Discharge Bathroom Shower/Tub: Walk-in shower Discharge Bathroom Toilet: Handicapped height Discharge Bathroom Accessibility: Yes How Accessible: Accessible via walker Does the patient have any problems obtaining your medications?: No   Social/Family/Support Systems Anticipated Caregiver: children and hired caregivers Anticipated Ambulance person Information: Jana Half (daughter) [phone]; Lucianne Lei (son) [phone] Ability/Limitations of Caregiver: n/a Caregiver Availability: 24/7 Discharge Plan Discussed with Primary Caregiver: Yes Is Caregiver In Agreement with Plan?: Yes Does Caregiver/Family have Issues with Lodging/Transportation while Pt is in Rehab?: No   Goals Patient/Family Goal for Rehab: PT/OT supervision to min assist, SLP min assist Expected length of stay: 14-16 days Pt/Family Agrees to Admission and willing to participate: Yes Program Orientation Provided & Reviewed with Pt/Caregiver Including Roles  & Responsibilities: Yes  Barriers to Discharge: Insurance for SNF coverage   Decrease burden of Care through IP rehab admission: n/a   Possible need for SNF placement upon discharge: Not anticipated.    Patient Condition: I have reviewed medical records from [facility], spoken with CM, and patient and son. I met with patient at the bedside for inpatient  rehabilitation assessment.  Patient will benefit from ongoing PT, OT, and SLP, can actively participate in 3 hours of therapy a day 5 days of the week, and can make measurable gains during the admission.  Patient will also benefit from the coordinated team approach during an Inpatient Acute Rehabilitation admission.  The patient will receive intensive therapy as well as Rehabilitation physician, nursing, social worker, and care management interventions.  Due to bladder management, bowel management, safety, skin/wound care, disease management, medication administration, pain management,  and patient education the patient requires 24 hour a day rehabilitation nursing.  The patient is currently max assist +1 with mobility and basic ADLs.  Discharge setting and therapy post discharge at home with home health is anticipated.  Patient has agreed to participate in the Acute Inpatient Rehabilitation Program and will admit today.   Preadmission Screen Completed By: Shann Medal, PT, DPT and  Michel Santee, PT, DPT 04/02/2022 3:15 PM ______________________________________________________________________   Discussed status with Dr. Naaman Plummer on 04/02/22  at 3:15 PM  and received approval for admission today.   Admission Coordinator:  Michel Santee, PT, DPT time 3:15 PM Sudie Grumbling 04/02/22     Assessment/Plan: Diagnosis: left thalamic/basal ganglia hemorrhage Does the need for close, 24 hr/day Medical supervision in concert with the patient's rehab needs make it unreasonable for this patient to be served in a less intensive setting? Yes Co-Morbidities requiring supervision/potential complications: HTN, DVT, PVD, a fib Due to bladder management, bowel management, safety, skin/wound care, disease management, medication administration, pain management, and patient education, does the patient require 24 hr/day rehab nursing? Yes Does the patient require coordinated care of a physician, rehab nurse, PT, OT, and SLP to  address physical and functional deficits in the context of the above medical diagnosis(es)? Yes Addressing deficits in the following areas: balance, endurance, locomotion, strength, transferring, bowel/bladder control, bathing, dressing, feeding, grooming, toileting, cognition, speech, and psychosocial support Can the patient actively participate in an intensive therapy program of at least 3 hrs of therapy 5 days a week? Yes The potential for patient to make measurable gains while on inpatient rehab is excellent Anticipated functional outcomes upon discharge from inpatient rehab: supervision and min assist PT, supervision and min assist OT, min assist SLP Estimated rehab length of stay to reach the above functional goals is: 14-16 days Anticipated discharge destination: Home 10. Overall Rehab/Functional Prognosis: good     MD Signature: Meredith Staggers, MD, [place] Director Rehabilitation Services 04/02/2022

## 2022-04-03 ENCOUNTER — Other Ambulatory Visit: Payer: Self-pay | Admitting: Family Medicine

## 2022-04-03 DIAGNOSIS — R339 Retention of urine, unspecified: Secondary | ICD-10-CM

## 2022-04-03 DIAGNOSIS — I10 Essential (primary) hypertension: Secondary | ICD-10-CM

## 2022-04-03 DIAGNOSIS — I69391 Dysphagia following cerebral infarction: Secondary | ICD-10-CM | POA: Diagnosis not present

## 2022-04-03 DIAGNOSIS — I610 Nontraumatic intracerebral hemorrhage in hemisphere, subcortical: Secondary | ICD-10-CM | POA: Diagnosis not present

## 2022-04-03 DIAGNOSIS — D638 Anemia in other chronic diseases classified elsewhere: Secondary | ICD-10-CM

## 2022-04-03 LAB — COMPREHENSIVE METABOLIC PANEL
ALT: 12 U/L (ref 0–44)
AST: 14 U/L — ABNORMAL LOW (ref 15–41)
Albumin: 2.5 g/dL — ABNORMAL LOW (ref 3.5–5.0)
Alkaline Phosphatase: 56 U/L (ref 38–126)
Anion gap: 5 (ref 5–15)
BUN: 15 mg/dL (ref 8–23)
CO2: 23 mmol/L (ref 22–32)
Calcium: 8.9 mg/dL (ref 8.9–10.3)
Chloride: 110 mmol/L (ref 98–111)
Creatinine, Ser: 0.85 mg/dL (ref 0.44–1.00)
GFR, Estimated: >60 mL/min (ref >60)
Glucose, Bld: 94 mg/dL (ref 70–99)
Potassium: 3.7 mmol/L (ref 3.5–5.1)
Sodium: 138 mmol/L (ref 135–145)
Total Bilirubin: 0.7 mg/dL (ref 0.3–1.2)
Total Protein: 5 g/dL — ABNORMAL LOW (ref 6.5–8.1)

## 2022-04-03 LAB — CBC WITH DIFFERENTIAL/PLATELET
Abs Immature Granulocytes: 0.1 10*3/uL — ABNORMAL HIGH (ref 0.00–0.07)
Basophils Absolute: 0 10*3/uL (ref 0.0–0.1)
Basophils Relative: 0 %
Eosinophils Absolute: 0.3 10*3/uL (ref 0.0–0.5)
Eosinophils Relative: 3 %
HCT: 27.9 % — ABNORMAL LOW (ref 36.0–46.0)
Hemoglobin: 8.9 g/dL — ABNORMAL LOW (ref 12.0–15.0)
Immature Granulocytes: 1 %
Lymphocytes Relative: 25 %
Lymphs Abs: 2.2 10*3/uL (ref 0.7–4.0)
MCH: 29.2 pg (ref 26.0–34.0)
MCHC: 31.9 g/dL (ref 30.0–36.0)
MCV: 91.5 fL (ref 80.0–100.0)
Monocytes Absolute: 1.3 10*3/uL — ABNORMAL HIGH (ref 0.1–1.0)
Monocytes Relative: 15 %
Neutro Abs: 4.9 10*3/uL (ref 1.7–7.7)
Neutrophils Relative %: 56 %
Platelets: 260 10*3/uL (ref 150–400)
RBC: 3.05 MIL/uL — ABNORMAL LOW (ref 3.87–5.11)
RDW: 14.8 % (ref 11.5–15.5)
WBC: 8.8 10*3/uL (ref 4.0–10.5)
nRBC: 0 % (ref 0.0–0.2)

## 2022-04-03 LAB — GLUCOSE, CAPILLARY: Glucose-Capillary: 124 mg/dL — ABNORMAL HIGH (ref 70–99)

## 2022-04-03 MED ORDER — ENSURE MAX PROTEIN PO LIQD: 11.0000 [oz_av] | Freq: Every day | ORAL | Status: DC

## 2022-04-03 MED ORDER — BOOST / RESOURCE BREEZE PO LIQD CUSTOM
1.0000 | Freq: Three times a day (TID) | ORAL | Status: DC
  Administered 2022-04-03 – 2022-04-27 (×19): 1 via ORAL

## 2022-04-03 NOTE — Progress Notes (Signed)
Nutrition Brief Note  RD received a page that pt does not like Ensure and would like to change the order.  This RD spoke with the pt. Pt reports that she does not like Ensure and she tried the [employer] from the RN and she likes that. Pt is agreeable to drinking Boost Breeze.  Pt reports that her appetite was good PTA and currently. States that she needs assistance feeding her self due to her hands not working. Pt reports that this is her baseline and needs assistance at home as well.  RD to order feeding assist with all meals and reached out to LPN to notify of assistance as well.   Pt denies any recent weight loss. Per EMR, pt has not had any weight loss within the past year.   No additional nutrition interventions warranted at this time. Please consult if other nutritional needs arise.    Hermina Barters RD, LDN Clinical Dietitian See Shea Evans for contact information.

## 2022-04-03 NOTE — Progress Notes (Signed)
Inpatient Rehabilitation  Patient information reviewed and entered into eRehab system by Casy Tavano M. Jakyria Bleau, M.A., CCC/SLP, PPS Coordinator.  Information including medical coding, functional ability and quality indicators will be reviewed and updated through discharge.    

## 2022-04-03 NOTE — Progress Notes (Signed)
PROGRESS NOTE   Subjective/Complaints: Pt seems a little anxious that she hasn't gotten help with her tray. Unwaware that her call bell is on her lap. Denies pain   ROS: Limited due to cognitive/behavioral    Objective:   DG Chest Port 1 View  Result Date: 04/02/2022 CLINICAL DATA:  Acute respiratory distress EXAM: PORTABLE CHEST 1 VIEW COMPARISON:  Chest 11/23/2020 FINDINGS: The image is mismarked for laterality. Patient does not have situs inversus based on prior chest x-ray and CT studies. Mild cardiac enlargement. Negative for heart failure. Mild left lower lobe airspace disease. Right lung clear. Chronic left rib fracture IMPRESSION: Mild left lower lobe airspace disease likely atelectasis. Possible small left effusion. Negative for heart failure. Electronically Signed   By: Franchot Gallo M.D.   On: 04/02/2022 19:30   Recent Labs    04/03/22 0529  WBC 8.8  HGB 8.9*  HCT 27.9*  PLT 260   Recent Labs    03/31/22 1142 04/03/22 0529  NA 137 138  K 4.0 3.7  CL 110 110  CO2 22 23  GLUCOSE 107* 94  BUN 25* 15  CREATININE 1.04* 0.85  CALCIUM 8.4* 8.9    Intake/Output Summary (Last 24 hours) at 04/03/2022 0816 Last data filed at 04/03/2022 0750 Gross per 24 hour  Intake 580 ml  Output 75 ml  Net 505 ml        Physical Exam: Vital Signs Blood pressure (!) 124/57, pulse 63, temperature 98.4 F (36.9 C), temperature source Oral, resp. rate 16, height '5\' 6"'$  (1.676 m), weight 70.3 kg, SpO2 99 %.  General: Alert and oriented x 3, No apparent distress HEENT: Head is normocephalic, atraumatic, PERRLA, EOMI, sclera anicteric, oral mucosa pink and moist, dentition intact, ext ear canals clear, O2 [place] Neck: Supple without JVD or lymphadenopathy Heart: Reg rate and rhythm. No murmurs rubs or gallops Chest: CTA bilaterally without wheezes, rales, or rhonchi; no distress Abdomen: Soft, non-tender, non-distended, bowel sounds  positive. Extremities: No clubbing, cyanosis, or edema. Pulses are 2+ Psych: Pt's affect is appropriate. Pt is cooperative Skin: Clean and intact without signs of breakdown Neuro:  alert, oriented to person. Follows basic commands. Sl dysarthria, word finding deficits but can speak in phrases and short sentences. RUE grossly 2+ to 3/5. RLE 2/5. Seems to sense temp/pain. No abnl tone Musculoskeletal: no pain with AROM/PROM of limbs, trunk    Assessment/Plan: 1. Functional deficits which require 3+ hours per day of interdisciplinary therapy in a comprehensive inpatient rehab setting. Physiatrist is providing close team supervision and 24 hour management of active medical problems listed below. Physiatrist and rehab team continue to assess barriers to discharge/monitor patient progress toward functional and medical goals  Care Tool:  Bathing              Bathing assist       Upper Body Dressing/Undressing Upper body dressing        Upper body assist      Lower Body Dressing/Undressing Lower body dressing            Lower body assist       Toileting Toileting    Toileting assist  Transfers Chair/bed transfer  Transfers assist           Locomotion Ambulation   Ambulation assist              Walk 10 feet activity   Assist           Walk 50 feet activity   Assist           Walk 150 feet activity   Assist           Walk 10 feet on uneven surface  activity   Assist           Wheelchair     Assist               Wheelchair 50 feet with 2 turns activity    Assist            Wheelchair 150 feet activity     Assist          Blood pressure (!) 124/57, pulse 63, temperature 98.4 F (36.9 C), temperature source Oral, resp. rate 16, height '5\' 6"'$  (1.676 m), weight 70.3 kg, SpO2 99 %.  Medical Problem List and Plan: 1. Functional deficits secondary to left thalamic intracranial  hemorrhage             -patient may shower             -ELOS/Goals: 14-16 days, supervision to min assist  -Patient is beginning CIR therapies today including PT, OT, and SLP  2.  Antithrombotics: -DVT/anticoagulation:  Pharmaceutical: Heparin             -antiplatelet therapy: none 3. Pain Management: Tylenol as needed. Neurontin 300 mg q HS 4. Mood: LCSW to evaluate and provide emotional support             -antipsychotic agents: n/a 5. Neuropsych: This patient is not capable of making decisions on her own behalf. 6. Skin/Wound Care: Routine skin care checks 7. Fluids/Electrolytes/Nutrition: Routine Is and Os and follow-up chemistries             --dysphagia 3 diet with thin liquids  -seems to be tolerating thus far  -albumin low, add protein supp 8. Hypertension: continue diltiazem, hydralazine, nadolol. Home losartan held  -bp well controlled currently 9: Hyperlipidemia: continue atorvastatin 10: GI prophylaxis: continue pantoprazole 11: pAF: continue diltiazem. No AC until resolution of ICH, outpt follow-up 12: Hypothyroidism: continue Synthroid 13: Urinary retention: continue Flomax             -continue foley for today. Consider removal/voiding trial tomorrow 14: UTI:  Keflex completed 15: AKI: BUN and Cr continue downward trend; follow-up BMP             -encourage fluids.  16: Leukocytosis: no fever, downward trend; follow-up CBC 17: Anemia, chronic: no evidence of acute blood loss -hgb 8.9--continue to track  18: Polymyalgia rheumatica: prednisone 2 mg with lunch 19. Dysphagia: tolerating D3/thin diet             -advance per SLP    LOS: 1 days A FACE TO FACE EVALUATION WAS PERFORMED  Meredith Staggers 04/03/2022, 8:16 AM

## 2022-04-03 NOTE — Progress Notes (Signed)
Inpatient Rehabilitation Admission Medication Review by a Pharmacist  A complete drug regimen review was completed for this patient to identify any potential clinically significant medication issues.  High Risk Drug Classes Is patient taking? Indication by Medication  Antipsychotic Yes Compazine- N/V  Anticoagulant Yes Heparin- VTE prophylaxis  Antibiotic No   Opioid No   Antiplatelet No   Hypoglycemics/insulin No   Vasoactive Medication Yes Diltiazem, hydralazine, nadolol, tamsulosin, NitroSL- hypertension, BPH, angina  Chemotherapy No   Other Yes Lipitor- HLD Gabapentin- neuropathic pain Synthroid- hypothyroidism Protonix- GERD Trazodone- sleep Robaxin- muscle spasms     Type of Medication Issue Identified Description of Issue Recommendation(s)  Drug Interaction(s) (clinically significant)     Duplicate Therapy     Allergy     No Medication Administration End Date     Incorrect Dose     Additional Drug Therapy Needed     Significant med changes from prior encounter (inform family/care partners about these prior to discharge).    Other  PTA meds: Apixaban Cozaar Vitamin D Restart PTA meds when and if necessary during CIR admission or at time of discharge, if warranted    Clinically significant medication issues were identified that warrant physician communication and completion of prescribed/recommended actions by midnight of the next day:  No  Time spent performing this drug regimen review (minutes):  30   Jaiana Sheffer BS, PharmD, BCPS Clinical Pharmacist 04/03/2022 7:10 AM  Contact: [phone] after 3 PM  "Be curious, not judgmental..." -Jamal Maes

## 2022-04-03 NOTE — Evaluation (Signed)
Occupational Therapy Assessment and Plan  Patient Details  Name: [name] MRN: [id number] Date of Birth: 1923-04-30  OT Diagnosis: hemiplegia affecting dominant side and muscle weakness (generalized) Rehab Potential: Rehab Potential (ACUTE ONLY): Good ELOS: 3-4 weeks   Today's Date: 04/03/2022 OT Individual Time: 0850-1000 OT Individual Time Calculation (min): 70 min     Hospital Problem: Principal Problem:   ICH (intracerebral hemorrhage) ([place])   Past Medical History:  Past Medical History:  Diagnosis Date   Closed fracture of left distal radius 12/29/2019   DVT (deep venous thrombosis) ([place]) 09/2015   RLE   Dysrhythmia    Afib   GERD (gastroesophageal reflux disease)    Hip fracture ([place]) 12/02/2020   Hypertension    Hypothyroidism    Melanoma ([place]) 06/16/2017   Facial melanoma, removed by Dr. Harvel Quale   Peripheral vascular disease [facility])    peripheral neuropathy   Past Surgical History:  Past Surgical History:  Procedure Laterality Date   ABDOMINAL HYSTERECTOMY     APPENDECTOMY     BACK SURGERY     BREAST SURGERY     left breast biopsy   CARDIOVERSION N/A 05/09/2020   Procedure: CARDIOVERSION;  Surgeon: Skeet Latch, MD;  Location: [place];  Service: Cardiovascular;  Laterality: N/A;   COLOSTOMY N/A 01/06/2020   Procedure: Colostomy;  Surgeon: Greer Pickerel, MD;  Location: [place];  Service: General;  Laterality: N/A;   EYE SURGERY     cataract   HARDWARE REMOVAL Left 01/01/2020   Procedure: HARDWARE REMOVAL;  Surgeon: Iran Planas, MD;  Location: [place];  Service: Orthopedics;  Laterality: Left;   HIP ARTHROPLASTY Left 12/03/2020   Procedure: ARTHROPLASTY BIPOLAR HIP (HEMIARTHROPLASTY);  Surgeon: Renette Butters, MD;  Location: [place];  Service: Orthopedics;  Laterality: Left;   HIP ARTHROPLASTY Left    partial   IR KYPHO EA ADDL LEVEL THORACIC OR LUMBAR  06/27/2021   IR RADIOLOGIST EVAL & MGMT  07/13/2021   LAPAROTOMY N/A 01/06/2020   Procedure:  Exploratory Laparotomy, Open Sigmoid colectomy;  Surgeon: Greer Pickerel, MD;  Location: [place];  Service: General;  Laterality: N/A;   LIPOMA EXCISION Left 04/25/2015   Procedure: EXCISION OF LIPOMA LEFT ARM;  Surgeon: Donnie Mesa, MD;  Location: [place];  Service: General;  Laterality: Left;   LUMBAR LAMINECTOMY/DECOMPRESSION MICRODISCECTOMY  11/20/2011   Procedure: LUMBAR LAMINECTOMY/DECOMPRESSION MICRODISCECTOMY;  Surgeon: Laurice Record [place];  Location: [facility];  Service: Orthopedics;  Laterality: N/A;  Decompressive Laminectomy L2 to the Sacrum (X-Ray)   LYSIS OF ADHESION N/A 01/17/2021   Procedure: RIGID PROCTOSCOPY;  Surgeon: Michael Boston, MD;  Location: [facility];  Service: General;  Laterality: N/A;   OPEN REDUCTION INTERNAL FIXATION (ORIF) DISTAL RADIAL FRACTURE Left 01/01/2020   Procedure: OPEN REDUCTION INTERNAL FIXATION (ORIF) DISTAL RADIAL FRACTURE;  Surgeon: Iran Planas, MD;  Location: [place];  Service: Orthopedics;  Laterality: Left;   OTHER SURGICAL HISTORY     left wrist surgery - has plate in left wrist    OTHER SURGICAL HISTORY     right knee surgery due to torn cartilage    TONSILLECTOMY     WRIST FRACTURE SURGERY Left    XI ROBOTIC ASSISTED COLOSTOMY TAKEDOWN N/A 01/17/2021   Procedure: XI ROBOTIC ASSISTED OSTOMY TAKEDOWN, LYSIS OF ADHESIONS, RECTOSIGMOID RESECTION, BILATERAL TAP BLOCK;  Surgeon: Michael Boston, MD;  Location: [facility];  Service: General;  Laterality: N/A;    Assessment & Plan Clinical Impression: [name] is a 88 year old female  who presented to the emergency department via EMS with right-sided weakness, aphasia, speech apraxia and slurred speech.  Code stroke was activated.  Imaging confirmed left basal ganglia intracranial hemorrhage with trace intraventricular hemorrhage and was in hypertensive emergency on arrival.  She has a history of atrial fibrillation on apixaban therapy.  This was reversed with Andexxa.  She was admitted to the ICU and  given labetalol and Cleviprex to keep systolic blood pressure within 130-150.  After swallow evaluation on 5/15, she was started on oral antihypertensives.  She was transferred out of the ICU on 5/16.  Her aphasia was improving but did fluctuate with fatigue.  Serial CTs performed.  She remained hemodynamically stable.  She exhibited increased confusion on 5/19 and urinalysis and culture were obtained.  Ceftriaxone initiated.  She developed urinary retention and Foley was placed, started on tamsulosin and Foley discontinued.  Culture was positive for Proteus and antibiotics were adjusted to Keflex.  Monitored on telemetry and her heart rate was controlled with Cardizem and nadolol.  Neurology recommends resumption of anticoagulation after resolution of her IPH and follow-up as outpatient. The patient requires inpatient medicine and rehabilitation evaluations and services for ongoing dysfunction secondary to left thalamic intracranial hemorrhage. Patient transferred to CIR on 04/02/2022 .    Patient currently requires max with basic self-care skills secondary to muscle weakness, decreased cardiorespiratoy endurance and decreased oxygen support, decreased coordination and decreased motor planning, decreased visual acuity, decreased attention to right, delayed processing, and decreased sitting balance, decreased standing balance, decreased postural control, hemiplegia, and decreased balance strategies.  Prior to hospitalization, patient could complete ADLs with modified independent .  Patient will benefit from skilled intervention to decrease level of assist with basic self-care skills prior to discharge  home independently with paid caregivers .  Anticipate patient will require 24 hour supervision and follow up home health.  OT - End of Session Activity Tolerance: Tolerates 10 - 20 min activity with multiple rests Endurance Deficit: Yes Endurance Deficit Description: generalized deconditioning, required a rest  break during session OT Assessment Rehab Potential (ACUTE ONLY): Good OT Patient demonstrates impairments in the following area(s): Safety;Perception;Balance;Cognition;Endurance;Vision;Motor;Pain OT Basic ADL's Functional Problem(s): Eating;Grooming;Bathing;Dressing;Toileting OT Transfers Functional Problem(s): Toilet;Tub/Shower OT Additional Impairment(s): Fuctional Use of Upper Extremity OT Plan OT Intensity: Minimum of 1-2 x/day, 45 to 90 minutes OT Frequency: 5 out of 7 days OT Duration/Estimated Length of Stay: 3-4 weeks OT Treatment/Interventions: Balance/vestibular training;Discharge planning;Pain management;Self Care/advanced ADL retraining;Therapeutic Activities;UE/LE Coordination activities;Visual/perceptual remediation/compensation;Therapeutic Exercise;Patient/family education;Functional mobility training;Cognitive remediation/compensation;Community reintegration;DME/adaptive equipment instruction;Neuromuscular re-education;Psychosocial support;UE/LE Strength taining/ROM;Wheelchair propulsion/positioning OT Self Feeding Anticipated Outcome(s): (S) OT Basic Self-Care Anticipated Outcome(s): min A OT Toileting Anticipated Outcome(s): min A OT Bathroom Transfers Anticipated Outcome(s): min A OT Recommendation Recommendations for Other Services: Speech consult Patient destination: Home Follow Up Recommendations: Home health OT Equipment Recommended: To be determined   OT Evaluation Precautions/Restrictions  Precautions Precautions: Fall Precaution Comments: R hemi Restrictions Weight Bearing Restrictions: No General Chart Reviewed: Yes Family/Caregiver Present: Yes (son present initially) Vital Signs Therapy Vitals Temp: 98.3 F (36.8 C) Pulse Rate: 66 Resp: (!) 22 BP: (!) 110/55 Patient Position (if appropriate): Sitting Oxygen Therapy SpO2: 97 % O2 Device: Room Air Pain Pain Assessment Pain Scale: 0-10 Pain Score: 0-No pain Pain Location: Rib cage Pain  Orientation: Left Pain Descriptors / Indicators: Aching Pain Intervention(s): Refused;Repositioned Home Living/Prior Functioning Home Living Family/patient expects to be discharged to:: Private residence Living Arrangements: Children Available Help at Discharge: Personal care attendant, Family, Available PRN/intermittently Type  of Home: House Home Access: Stairs to enter [employer] of Steps: 2 Entrance Stairs-Rails: Right, Left, Can reach both Home Layout: One level, Two level Bathroom Shower/Tub: Multimedia programmer: Handicapped height Bathroom Accessibility: Yes Additional Comments: caregiver comes 4 days/ wk, 9-3p. Numerous family members live within 2 mi and are alerted whenever she pushed life alert button as she did this time  Lives With: Alone IADL History Homemaking Responsibilities: Yes Meal Prep Responsibility: Primary Laundry Responsibility: Primary Cleaning Responsibility: No Bill Paying/Finance Responsibility: No Shopping Responsibility: No Current License: No Occupation: Retired Leisure and Hobbies: Reading Prior Function Level of Independence: [place] for independence, Independent with basic ADLs, Independent with gait Driving: No Vocation: Retired Surveyor, mining Baseline Vision/History: 6 Macular Degeneration Ability to See in Adequate Light: 2 Moderately impaired Patient Visual Report: Other (comment) (reports it is "just so different" but with no specific) Vision Assessment?: Yes Eye Alignment: Within Functional Limits Ocular Range of Motion: Within Functional Limits Alignment/Gaze Preference: Within Defined Limits Tracking/Visual Pursuits: Able to track stimulus in all quads without difficulty Saccades: Within functional limits Additional Comments: Macular degeneration, reports possible R impairements but nothing Perception  Perception: Impaired Inattention/Neglect: Does not attend to right visual field Praxis Praxis:  Impaired Praxis Impairment Details: Motor planning Cognition Cognition Overall Cognitive Status: Impaired/Different from baseline Arousal/Alertness: Awake/alert Orientation Level: Person;Place;Situation Person: Oriented Place: Oriented Situation: Oriented Attention: Selective Selective Attention: Appears intact Awareness: Appears intact Problem Solving: Appears intact Safety/Judgment: Appears intact Brief Interview for Mental Status (BIMS) Repetition of Three Words (First Attempt): 3 Temporal Orientation: Year: Correct Temporal Orientation: Month: Accurate within 5 days Temporal Orientation: Day: Correct Recall: "Sock": No, could not recall Recall: "Blue": No, could not recall Recall: "Bed": No, could not recall BIMS Summary Score: 9 Sensation Sensation Light Touch: Appears Intact Hot/Cold: Appears Intact Coordination Gross Motor Movements are Fluid and Coordinated: No Fine Motor Movements are Fluid and Coordinated: No Coordination and Movement Description: R hemi Motor  Motor Motor: Hemiplegia Motor - Skilled Clinical Observations: R hemi  Trunk/Postural Assessment  Cervical Assessment Cervical Assessment: Within Functional Limits Thoracic Assessment Thoracic Assessment: Exceptions to [facility] (rounded shoulders) Lumbar Assessment Lumbar Assessment: Exceptions to [facility] (posterior pelvic tilt) Postural Control Postural Control: Deficits on evaluation Righting Reactions: delayed and inadequate  Balance Balance Balance Assessed: Yes Static Sitting Balance Static Sitting - Balance Support: Feet supported Static Sitting - Level of Assistance: 4: Min assist Dynamic Sitting Balance Dynamic Sitting - Balance Support: Feet supported Dynamic Sitting - Level of Assistance: 3: Mod assist Static Standing Balance Static Standing - Balance Support: During functional activity;Bilateral upper extremity supported Static Standing - Level of Assistance: 2: Max assist Dynamic Standing  Balance Dynamic Standing - Balance Support: During functional activity;Bilateral upper extremity supported Dynamic Standing - Level of Assistance: 1: +2 Total assist Extremity/Trunk Assessment RUE Assessment RUE Assessment: Exceptions to [facility] Active Range of Motion (AROM) Comments: 70 degrees shoulder flexion, full elbow and hand RUE Body System: Neuro Brunstrum levels for arm and hand: Arm;Hand Brunstrum level for arm: Stage IV Movement is deviating from synergy Brunstrum level for hand: Stage IV Movements deviating from synergies LUE Assessment LUE Assessment: Exceptions to [facility] Strength Comments: Baseline limitations in AROM, shoulder flexion to 90 degrees, abduction to 30 degrees  Care Tool Care Tool Self Care Eating   Eating Assist Level: Supervision/Verbal cueing    Oral Care    Oral Care Assist Level: Supervision/Verbal cueing    Bathing   Body parts bathed by patient: Right  arm;Left arm;Chest;Abdomen;Right upper leg;Left upper leg;Face Body parts bathed by helper: Front perineal area;Buttocks;Right lower leg;Left lower leg   Assist Level: Moderate Assistance - Patient 50 - 74%    Upper Body Dressing(including orthotics)   What is the patient wearing?: Pull over shirt   Assist Level: Maximal Assistance - Patient 25 - 49%    Lower Body Dressing (excluding footwear)   What is the patient wearing?: Pants Assist for lower body dressing: 2 Helpers    Putting on/Taking off footwear   What is the patient wearing?: Non-skid slipper socks Assist for footwear: Dependent - Patient 0%       Care Tool Toileting Toileting activity   Assist for toileting: 2 Helpers     Care Tool Bed Mobility Roll left and right activity   Roll left and right assist level: Maximal Assistance - Patient 25 - 49%    Sit to lying activity   Sit to lying assist level: Maximal Assistance - Patient 25 - 49%    Lying to sitting on side of bed activity   Lying to sitting on side of bed  assist level: the ability to move from lying on the back to sitting on the side of the bed with no back support.: Maximal Assistance - Patient 25 - 49%     Care Tool Transfers Sit to stand transfer   Sit to stand assist level: 2 Helpers    Chair/bed transfer   Chair/bed transfer assist level: 2 Pension scheme manager transfer   Assist Level: 2 Helpers     Care Tool Cognition  Expression of Ideas and Wants Expression of Ideas and Wants: 3. Some difficulty - exhibits some difficulty with expressing needs and ideas (e.g, some words or finishing thoughts) or speech is not clear  Understanding Verbal and Non-Verbal Content Understanding Verbal and Non-Verbal Content: 3. Usually understands - understands most conversations, but misses some part/intent of message. Requires cues at times to understand   Memory/Recall Ability Memory/Recall Ability : Current season;Location of own room;Staff names and faces;That he or she is in a hospital/hospital unit   Refer to Care Plan for [place] 1 OT Short Term Goal 1 (Week 1): Pt will don a shirt with no more than min cueing for hemi technique and min A OT Short Term Goal 2 (Week 1): Pt will don pants with mod A OT Short Term Goal 3 (Week 1): Pt will maintain sitting balance EOB during ADLs with no more than CGA OT Short Term Goal 4 (Week 1): Pt will complete stand pivot transfer with max of 1  Recommendations for other services: Other: speech therapy    Skilled Therapeutic Intervention ADL ADL Eating: Supervision/safety Where Assessed-Eating: Bed level Grooming: Supervision/safety;Setup;Minimal cueing Where Assessed-Grooming: Chair Upper Body Bathing: Minimal assistance Where Assessed-Upper Body Bathing: Edge of bed Lower Body Bathing: Moderate assistance Where Assessed-Lower Body Bathing: Edge of bed Upper Body Dressing: Moderate cueing;Moderate assistance Where Assessed-Upper Body Dressing: Edge of bed Lower Body  Dressing: Dependent Where Assessed-Lower Body Dressing: Edge of bed Toileting: Unable to assess Toilet Transfer: Other (comment) (max +2) Toilet Transfer Method: Stand pivot;Other (comment) (stedy) Tub/Shower Transfer: Unable to assess Mobility  Bed Mobility Bed Mobility: Sit to Supine;Supine to Sit Supine to Sit: Moderate Assistance - Patient 50-74% Sit to Supine: Moderate Assistance - Patient 50-74% Transfers Sit to Stand: Maximal Assistance - Patient 25-49% Stand to Sit: Maximal Assistance - Patient 25-49%  Skilled OT evaluation completed  with the creation of pt centered OT POC. Pt educated on condition, ELOS, rehab expectations, and fall risk reduction strategies throughout session. Pt presents with poor trunk/postural control, generalized weakness, R hemi, cognitive deficits, and decreased balance strategies. See above for performance of ADLs. Pt left sitting up with all needs met in [facility] TIS w/c- chair alarm set, son present.    Discharge Criteria: Patient will be discharged from OT if patient refuses treatment 3 consecutive times without medical reason, if treatment goals not met, if there is a change in medical status, if patient makes no progress towards goals or if patient is discharged from hospital.  The above assessment, treatment plan, treatment alternatives and goals were discussed and mutually agreed upon: by patient  Curtis Sites 04/03/2022, 9:58 AM

## 2022-04-03 NOTE — Evaluation (Signed)
Physical Therapy Assessment and Plan  Patient Details  Name: [name] MRN: [id number] Date of Birth: [birth year]  PT Diagnosis: Abnormal posture, Abnormality of gait, Difficulty walking, and Hemiplegia dominant Rehab Potential: Good ELOS: 4 weeks.   Today's Date: 04/03/2022 PT Individual Time: 1300-1400 PT Individual Time Calculation (min): 60 min    Hospital Problem: Principal Problem:   ICH (intracerebral hemorrhage) (HCC)   Past Medical History:  Past Medical History:  Diagnosis Date   Closed fracture of left distal radius 12/29/2019   DVT (deep venous thrombosis) ([place]) 09/2015   RLE   Dysrhythmia    Afib   GERD (gastroesophageal reflux disease)    Hip fracture ([place]) 12/02/2020   Hypertension    Hypothyroidism    Melanoma ([place]) 06/16/2017   Facial melanoma, removed by Dr. Harvel Quale   Peripheral vascular disease [facility])    peripheral neuropathy   Past Surgical History:  Past Surgical History:  Procedure Laterality Date   ABDOMINAL HYSTERECTOMY     APPENDECTOMY     BACK SURGERY     BREAST SURGERY     left breast biopsy   CARDIOVERSION N/A 05/09/2020   Procedure: CARDIOVERSION;  Surgeon: Skeet Latch, MD;  Location: [place];  Service: Cardiovascular;  Laterality: N/A;   COLOSTOMY N/A 01/06/2020   Procedure: Colostomy;  Surgeon: Greer Pickerel, MD;  Location: [place];  Service: General;  Laterality: N/A;   EYE SURGERY     cataract   HARDWARE REMOVAL Left 01/01/2020   Procedure: HARDWARE REMOVAL;  Surgeon: Iran Planas, MD;  Location: [place];  Service: Orthopedics;  Laterality: Left;   HIP ARTHROPLASTY Left 12/03/2020   Procedure: ARTHROPLASTY BIPOLAR HIP (HEMIARTHROPLASTY);  Surgeon: Renette Butters, MD;  Location: [place];  Service: Orthopedics;  Laterality: Left;   HIP ARTHROPLASTY Left    partial   IR KYPHO EA ADDL LEVEL THORACIC OR LUMBAR  06/27/2021   IR RADIOLOGIST EVAL & MGMT  07/13/2021   LAPAROTOMY N/A 01/06/2020   Procedure: Exploratory Laparotomy, Open  Sigmoid colectomy;  Surgeon: Greer Pickerel, MD;  Location: [place];  Service: General;  Laterality: N/A;   LIPOMA EXCISION Left 04/25/2015   Procedure: EXCISION OF LIPOMA LEFT ARM;  Surgeon: Donnie Mesa, MD;  Location: [place];  Service: General;  Laterality: Left;   LUMBAR LAMINECTOMY/DECOMPRESSION MICRODISCECTOMY  11/20/2011   Procedure: LUMBAR LAMINECTOMY/DECOMPRESSION MICRODISCECTOMY;  Surgeon: Laurice Record [place];  Location: [facility];  Service: Orthopedics;  Laterality: N/A;  Decompressive Laminectomy L2 to the Sacrum (X-Ray)   LYSIS OF ADHESION N/A 01/17/2021   Procedure: RIGID PROCTOSCOPY;  Surgeon: Michael Boston, MD;  Location: [facility];  Service: General;  Laterality: N/A;   OPEN REDUCTION INTERNAL FIXATION (ORIF) DISTAL RADIAL FRACTURE Left 01/01/2020   Procedure: OPEN REDUCTION INTERNAL FIXATION (ORIF) DISTAL RADIAL FRACTURE;  Surgeon: Iran Planas, MD;  Location: [place];  Service: Orthopedics;  Laterality: Left;   OTHER SURGICAL HISTORY     left wrist surgery - has plate in left wrist    OTHER SURGICAL HISTORY     right knee surgery due to torn cartilage    TONSILLECTOMY     WRIST FRACTURE SURGERY Left    XI ROBOTIC ASSISTED COLOSTOMY TAKEDOWN N/A 01/17/2021   Procedure: XI ROBOTIC ASSISTED OSTOMY TAKEDOWN, LYSIS OF ADHESIONS, RECTOSIGMOID RESECTION, BILATERAL TAP BLOCK;  Surgeon: Michael Boston, MD;  Location: [facility];  Service: General;  Laterality: N/A;    Assessment & Plan Clinical Impression: [name] is a 88 year old female who presented to  the emergency department via EMS with right-sided weakness, aphasia, speech apraxia and slurred speech.  Code stroke was activated.  Imaging confirmed left basal ganglia intracranial hemorrhage with trace intraventricular hemorrhage and was in hypertensive emergency on arrival.  She has a history of atrial fibrillation on apixaban therapy.  This was reversed with Andexxa.  She was admitted to the ICU and given labetalol and Cleviprex to  keep systolic blood pressure within 130-150.  After swallow evaluation on 5/15, she was started on oral antihypertensives.  She was transferred out of the ICU on 5/16.  Her aphasia was improving but did fluctuate with fatigue.  Serial CTs performed.  She remained hemodynamically stable.  She exhibited increased confusion on 5/19 and urinalysis and culture were obtained.  Ceftriaxone initiated.  She developed urinary retention and Foley was placed, started on tamsulosin and Foley discontinued.  Culture was positive for Proteus and antibiotics were adjusted to Keflex.  Monitored on telemetry and her heart rate was controlled with Cardizem and nadolol.  Neurology recommends resumption of anticoagulation after resolution of her IPH and follow-up as outpatient. The patient requires inpatient medicine and rehabilitation evaluations and services for ongoing dysfunction secondary to left thalamic intracranial hemorrhage.   Spoke with daughter and son as patient being transferred. She has been more somnolent and confused today. She was more alert and engaged yesterday. There was possibility of panic yesterday and daughter believes the chest discomfort could have been acid reflux as she has GERD. Will discontinue Xanax prn.  Patient transferred to CIR on 04/02/2022 .   Patient currently requires total with mobility secondary to muscle weakness, decreased coordination and decreased motor planning, and decreased visual acuity.  Prior to hospitalization, patient was supervision with mobility and lived with Alone in a House home.  Home access is 2Stairs to enter.  Patient will benefit from skilled PT intervention to maximize safe functional mobility, minimize fall risk, and decrease caregiver burden for planned discharge home with 24 hour supervision.  Anticipate patient will benefit from follow up [place] at discharge.  PT - End of Session Activity Tolerance: Tolerates 10 - 20 min activity with multiple rests Endurance  Deficit: Yes Endurance Deficit Description: generalized deconditioning, required a rest break during session PT Assessment Rehab Potential (ACUTE/IP ONLY): Good PT Patient demonstrates impairments in the following area(s): Balance;Safety;Endurance;Motor PT Transfers Functional Problem(s): Bed Mobility;Bed to Chair;Car;Furniture PT Locomotion Functional Problem(s): Ambulation;Wheelchair Mobility;Stairs PT Plan PT Intensity: Minimum of 1-2 x/day ,45 to 90 minutes PT Frequency: 5 out of 7 days PT Duration Estimated Length of Stay: 4 weeks. PT Treatment/Interventions: Ambulation/gait training;Discharge planning;Functional mobility training;Therapeutic Activities;UE/LE Strength taining/ROM;Balance/vestibular training;Community reintegration;Neuromuscular re-education;Patient/family education;Stair training;Therapeutic Exercise;Wheelchair propulsion/positioning PT Transfers Anticipated Outcome(s): min CGA for all OOB transfers. PT Locomotion Anticipated Outcome(s): min A w/ LRAD PT Recommendation Follow Up Recommendations: Home health PT Patient destination: Home Equipment Recommended: To be determined   PT Evaluation Precautions/Restrictions Precautions Precautions: Fall Precaution Comments: R hemi Restrictions Weight Bearing Restrictions: No General Chart Reviewed: Yes Family/Caregiver Present: No (Family present at initiation for interview but not functional portion.) Vital SignsTherapy Vitals Temp: 97.9 F (36.6 C) Temp Source: Oral Pulse Rate: 61 Resp: (!) 22 BP: (!) 145/60 Patient Position (if appropriate): Lying Oxygen Therapy SpO2: 98 % O2 Device: Room Air Pain Pain Assessment Pain Scale: 0-10 Pain Score: 0-No pain Multiple Pain Sites: No Pain Interference Pain Interference Pain Effect on Sleep: 0. Does not apply - I have not had any pain or hurting in the past 5 days Pain  Interference with Therapy Activities: 0. Does not apply - I have not received  rehabilitationtherapy in the past 5 days Pain Interference with Day-to-Day Activities: 1. Rarely or not at all Home Living/Prior [place] Available Help at Discharge: Personal care attendant;Family;Available PRN/intermittently Type of Home: House Home Access: Stairs to enter [employer] of Steps: 2 Entrance Stairs-Rails: Right;Left;Can reach both Home Layout: One level Bathroom Shower/Tub: Walk-in shower Additional Comments: caregiver comes 4 days/ wk, 9-3p. Numerous family members live within 2 mi and are alerted whenever she pushed life alert button as she did this time  Lives With: Alone Prior Function Level of Independence: Independent with gait;Independent with transfers;Requires assistive device for independence  Able to Take Stairs?: Yes Driving: No Vocation: Retired Vision/Perception  Vision - History Ability to See in Adequate Light: 2 Moderately impaired  Cognition Overall Cognitive Status: Impaired/Different from baseline Arousal/Alertness: Awake/alert Awareness: Appears intact Safety/Judgment: Appears intact Sensation Sensation Light Touch: Appears Intact Coordination Gross Motor Movements are Fluid and Coordinated: No Fine Motor Movements are Fluid and Coordinated: No Coordination and Movement Description: R hemi Heel Shin Test: able to bring R heel to L shin to perform movement, but unable to maintain fluidity. Motor  Motor Motor: Hemiplegia Motor - Skilled Clinical Observations: R hemi   Trunk/Postural Assessment  Cervical Assessment Cervical Assessment: Within Functional Limits Thoracic Assessment Thoracic Assessment: Exceptions to [facility] (rounded shoulders.) Lumbar Assessment Lumbar Assessment: Exceptions to [facility] (post pelvic tilt.) Postural Control Postural Control: Deficits on evaluation Righting Reactions: delayed and inadequate  Balance Balance Balance Assessed: Yes Static Sitting Balance Static Sitting - Balance Support:  Feet supported Static Sitting - Level of Assistance: 4: Min assist Dynamic Sitting Balance Dynamic Sitting - Balance Support: Feet supported Dynamic Sitting - Level of Assistance: 3: Mod assist Static Standing Balance Static Standing - Balance Support: During functional activity;Bilateral upper extremity supported Static Standing - Level of Assistance: 2: Max assist Extremity Assessment      RLE Assessment RLE Assessment: Exceptions to [facility] RLE Strength RLE Overall Strength: Deficits Right Hip Flexion: 3-/5 Right Knee Flexion: 3+/5 Right Knee Extension: 4-/5 Right Ankle Dorsiflexion: 3+/5 LLE Assessment LLE Assessment: Within Functional Limits General Strength Comments: grossly 4/5 to 5/5  Care Tool Care Tool Bed Mobility Roll left and right activity   Roll left and right assist level: Moderate Assistance - Patient 50 - 74%    Sit to lying activity   Sit to lying assist level: Moderate Assistance - Patient 50 - 74%    Lying to sitting on side of bed activity   Lying to sitting on side of bed assist level: the ability to move from lying on the back to sitting on the side of the bed with no back support.: Maximal Assistance - Patient 25 - 49%     Care Tool Transfers Sit to stand transfer   Sit to stand assist level: Maximal Assistance - Patient 25 - 49%    Chair/bed transfer   Chair/bed transfer assist level: Total Assistance - Patient < 25%     Physiological scientist transfer assist level: Total Assistance - Patient < 25%      Care Tool Locomotion Ambulation Ambulation activity did not occur: Safety/medical concerns        Walk 10 feet activity Walk 10 feet activity did not occur: Safety/medical concerns       Walk 50 feet with 2 turns activity Walk 50 feet with 2  turns activity did not occur: Safety/medical concerns      Walk 150 feet activity Walk 150 feet activity did not occur: Safety/medical concerns      Walk 10 feet on uneven  surfaces activity Walk 10 feet on uneven surfaces activity did not occur: Safety/medical concerns      Stairs Stair activity did not occur: Safety/medical concerns        Walk up/down 1 step activity Walk up/down 1 step or curb (drop down) activity did not occur: Safety/medical concerns      Walk up/down 4 steps activity Walk up/down 4 steps activity did not occur: Safety/medical concerns      Walk up/down 12 steps activity Walk up/down 12 steps activity did not occur: Safety/medical concerns      Pick up small objects from floor Pick up small object from the floor (from standing position) activity did not occur: Safety/medical concerns      Wheelchair Is the patient using a wheelchair?: Yes Type of Wheelchair: Manual   Wheelchair assist level: Minimal Assistance - Patient > 75% Max wheelchair distance: 20  Wheel 50 feet with 2 turns activity   Assist Level: Dependent - Patient 0%  Wheel 150 feet activity   Assist Level: Dependent - Patient 0%    Refer to Care Plan for Long Term Goals  SHORT TERM GOAL WEEK 1 PT Short Term Goal 1 (Week 1): pt will roll side to side w/ CGA. PT Short Term Goal 2 (Week 1): Pt will transfer sup/sidelying to sit w/ min A PT Short Term Goal 3 (Week 1): Pt will transfer sit to stand w/ mod A PT Short Term Goal 4 (Week 1): Pt will perform SPT w/ mod A  Recommendations for other services: None   Skilled Therapeutic Intervention Evaluation completed (see details above and below) with education on PT POC and goals and individual treatment initiated with focus on  NMR, transfers, endurance, strengthening, progress to gait.  Pt presents sitting in w/c and stating fatigue from therapy.  Pt wheeled to small gym for energy conservation.  Pt transfers sit to stand w/ max to Total A to RW w/ blocking R knee and hand over hand for R hand placement.  Pt able to stand w/ min to mod A, but w/ R sided lean.  Pt transferred sit to stand and SPT w/ facilitation for  weight shift to left for R LE advancement.  Pt tends to move LLE too far forward.  Pt required max to total A for car transfer and then min A to bring RLE in and out of car.  Pt wheeled x 20' w/ L extremities and verbal cues for use of L foot.  Pt performed SPT w/c > bed going to left w/ max to total A.  Pt performed sit to supine w/ min to mod A, rolling to left w/ mod A, to R w/ min A and verbal cues for hooklying position.  Pt performed log roll sup to sit w/ mod A and same for reverse.  Pt remained in bed w/ bed alarm on and all needsin reach.       Mobility Bed Mobility Bed Mobility: Rolling Right;Rolling Left;Sit to Supine;Right Sidelying to Sit;Sit to Sidelying Right Rolling Right: Minimal Assistance - Patient > 75% Rolling Left: Moderate Assistance - Patient 50-74% Right Sidelying to Sit: Maximal Assistance - Patient 25-49% Sit to Sidelying Right: Moderate Assistance - Patient 50-74% Transfers Transfers: Sit to Stand;Stand to Sit;Stand Pivot Transfers Sit to Stand:  Maximal Assistance - Patient 25-49% Stand to Sit: Maximal Assistance - Patient 25-49% Stand Pivot Transfers: Total Assistance - Patient < 25% Stand Pivot Transfer Details: Manual facilitation for weight shifting;Verbal cues for sequencing Stand Pivot Transfer Details (indicate cue type and reason): PT in front. Transfer (Assistive device): None Locomotion  Gait Ambulation: No Gait Gait: No Stairs / Additional Locomotion Stairs: No Wheelchair Mobility Wheelchair Mobility: Yes Wheelchair Assistance: Minimal assistance - Patient >75% Wheelchair Propulsion: Left lower extremity;Left upper extremity Wheelchair Parts Management: Needs assistance;Other (comment) (cueing for use of L foot.) Distance: 20'   Discharge Criteria: Patient will be discharged from PT if patient refuses treatment 3 consecutive times without medical reason, if treatment goals not met, if there is a change in medical status, if patient makes no  progress towards goals or if patient is discharged from hospital.  The above assessment, treatment plan, treatment alternatives and goals were discussed and mutually agreed upon: by patient  [name] 04/03/2022, 3:10 PM

## 2022-04-03 NOTE — Evaluation (Signed)
Speech Language Pathology Assessment and Plan  Patient Details  Name: [name] MRN: [id number] Date of Birth: [birth year]  SLP Diagnosis: Aphasia;Cognitive Impairments;Speech and Language deficits;Dysphagia  Rehab Potential: Good ELOS: 3-4 weeks    Today's Date: 04/03/2022 SLP Individual Time: 1101-1200 SLP Individual Time Calculation (min): 59 min   Hospital Problem: Principal Problem:   ICH (intracerebral hemorrhage) (HCC)  Past Medical History:  Past Medical History:  Diagnosis Date   Closed fracture of left distal radius 12/29/2019   DVT (deep venous thrombosis) ([place]) 09/2015   RLE   Dysrhythmia    Afib   GERD (gastroesophageal reflux disease)    Hip fracture ([place]) 12/02/2020   Hypertension    Hypothyroidism    Melanoma ([place]) 06/16/2017   Facial melanoma, removed by Dr. Harvel Quale   Peripheral vascular disease [facility])    peripheral neuropathy   Past Surgical History:  Past Surgical History:  Procedure Laterality Date   ABDOMINAL HYSTERECTOMY     APPENDECTOMY     BACK SURGERY     BREAST SURGERY     left breast biopsy   CARDIOVERSION N/A 05/09/2020   Procedure: CARDIOVERSION;  Surgeon: Skeet Latch, MD;  Location: [address] [place];  Service: Cardiovascular;  Laterality: N/A;   COLOSTOMY N/A 01/06/2020   Procedure: Colostomy;  Surgeon: Greer Pickerel, MD;  Location: [place];  Service: General;  Laterality: N/A;   EYE SURGERY     cataract   HARDWARE REMOVAL Left 01/01/2020   Procedure: HARDWARE REMOVAL;  Surgeon: Iran Planas, MD;  Location: [place];  Service: Orthopedics;  Laterality: Left;   HIP ARTHROPLASTY Left 12/03/2020   Procedure: ARTHROPLASTY BIPOLAR HIP (HEMIARTHROPLASTY);  Surgeon: Renette Butters, MD;  Location: [place];  Service: Orthopedics;  Laterality: Left;   HIP ARTHROPLASTY Left    partial   IR KYPHO EA ADDL LEVEL THORACIC OR LUMBAR  06/27/2021   IR RADIOLOGIST EVAL & MGMT  07/13/2021   LAPAROTOMY N/A 01/06/2020   Procedure: Exploratory Laparotomy, Open  Sigmoid colectomy;  Surgeon: Greer Pickerel, MD;  Location: [place];  Service: General;  Laterality: N/A;   LIPOMA EXCISION Left 04/25/2015   Procedure: EXCISION OF LIPOMA LEFT ARM;  Surgeon: Donnie Mesa, MD;  Location: [place];  Service: General;  Laterality: Left;   LUMBAR LAMINECTOMY/DECOMPRESSION MICRODISCECTOMY  11/20/2011   Procedure: LUMBAR LAMINECTOMY/DECOMPRESSION MICRODISCECTOMY;  Surgeon: Laurice Record [place];  Location: [facility];  Service: Orthopedics;  Laterality: N/A;  Decompressive Laminectomy L2 to the Sacrum (X-Ray)   LYSIS OF ADHESION N/A 01/17/2021   Procedure: RIGID PROCTOSCOPY;  Surgeon: Michael [place], MD;  Location: [facility];  Service: General;  Laterality: N/A;   OPEN REDUCTION INTERNAL FIXATION (ORIF) DISTAL RADIAL FRACTURE Left 01/01/2020   Procedure: OPEN REDUCTION INTERNAL FIXATION (ORIF) DISTAL RADIAL FRACTURE;  Surgeon: Iran Planas, MD;  Location: [place];  Service: Orthopedics;  Laterality: Left;   OTHER SURGICAL HISTORY     left wrist surgery - has plate in left wrist    OTHER SURGICAL HISTORY     right knee surgery due to torn cartilage    TONSILLECTOMY     WRIST FRACTURE SURGERY Left    XI ROBOTIC ASSISTED COLOSTOMY TAKEDOWN N/A 01/17/2021   Procedure: XI ROBOTIC ASSISTED OSTOMY TAKEDOWN, LYSIS OF ADHESIONS, RECTOSIGMOID RESECTION, BILATERAL TAP BLOCK;  Surgeon: Michael [place], MD;  Location: [facility];  Service: General;  Laterality: N/A;    Assessment / Plan / Recommendation Clinical Impression  Pt is a 88 y/o female with PMH of DVT,  HTN, PVD, afib (on eliquis), who presented to [facility] on 5/15 with c/o R sided weakness, aphasia/apraxia.  Imaging revealed a 39m left basal ganglia ICH with trace IVH with hypertensive emergency on arrival.  Neurology following.  MRI showed evolving left thalamic ICH with small IVH.  MRA no significant vascular abnormality.  [facility] and [facility].  Dopplers unremarkable.  Echo showed EF 55-60%.  A1C 5.9.   Recommendations to stop eliquis due to IPH.  Pt initially required cleviprex, but is not BP manged on home meds with PRN labetalol and hydralazine.  Therapy ongoing and pt was recommended for CIR.   Pt presents with mild expressive and receptive aphasia. Pt demonstrates ability to express wants/needs at sentence level with awareness of most mild speech and word finding errors. Pt can name common objects, name actions in pictures, but in higher level task, divergent naming pt could only name 7 animals in a minute. Pt can follow 1 step commands, 2 step commands with 80% accuracy and 3 step commands are further impacted by acute deficits in recall. Pt is able to read at sentence level and witting is limited due to weak RUE. Pt demonstrates speech intelligibility at 80-70% accuracy due to misarticulation and fast rate, further impacted by fatigue. Pt presents mild deficits in basic problem solving, and moderate-severe deficits in short term recall. Pt scored 10/30 (n=>27)  formal cognitive linguistic assessment SLUMS, pt supports primarily due to impaired memory verse language deficits. Pt's oral motor skills appear functional except for mild right side facial and lingual weakness. Pt demonstrated PO consumption of regular textures, dys 3 textures and thin liquids via straw. Pt demonstrated appropriate mastication, oral clearance, timely swallow and no overt s/s aspiration. SLP upgraded diet to regular textures and thin liquids with full supervision A due to recent upgrade and pt's need for assistance feeding. Pt's family is ok to provide supervision.  Pt would benefit from skilled ST services in order to maximize functional independence and reduce burden of care, likely requiring intermittent supervision at discharge with possible continued skilled ST services.     Skilled Therapeutic Interventions          Skilled ST services focused on cognitive skills. SLP facilitated administration of cognitive linguistic  formal assessment and provided education of results. SLP and pt collaborated to set goals for cognitive linguistic needs during length of stay. All questions answered to satisfaction.  Pt was left in room with family, call bell within reach and chair alarm set. SLP recommends to continue skilled services.   SLP Assessment  Patient will need skilled Speech Lanaguage Pathology Services during CIR admission    Recommendations  SLP Diet Recommendations: Age appropriate regular solids;Thin Liquid Administration via: Cup;Straw Medication Administration: Whole meds with liquid Supervision: Full supervision/cueing for compensatory strategies Compensations: Small sips/bites;Slow rate Postural Changes and/or Swallow Maneuvers: Seated upright 90 degrees Oral Care Recommendations: Oral care BID Patient destination: Home Follow up Recommendations: 24 hour supervision/assistance;Outpatient SLP Equipment Recommended: None recommended by SLP    SLP Frequency 3 to 5 out of 7 days   SLP Duration  SLP Intensity  SLP Treatment/Interventions 3-4 weeks  Minumum of 1-2 x/day, 30 to 90 minutes  Cognitive remediation/compensation;Cueing hierarchy;Functional tasks;Internal/external aids;Speech/Language facilitation;Patient/family education;Dysphagia/aspiration precaution training    Pain Pain Assessment Pain Scale: 0-10 Pain Score: 0-No pain Multiple Pain Sites: No  Prior Functioning Cognitive/Linguistic Baseline: Within functional limits Type of Home: House  Lives With: Alone Available Help at Discharge: Personal care  attendant;Family;Available PRN/intermittently Vocation: Retired  SLP Evaluation Cognition Overall Cognitive Status: Impaired/Different from baseline Arousal/Alertness: Awake/alert Orientation Level: Oriented X4 Attention: Selective Selective Attention: Appears intact Memory: Impaired Memory Impairment: Storage deficit;Decreased recall of new information;Retrieval  deficit Awareness: Impaired Awareness Impairment: Emergent impairment;Anticipatory impairment Problem Solving: Impaired Problem Solving Impairment: Verbal basic;Functional basic Safety/Judgment: Appears intact  Comprehension Auditory Comprehension Overall Auditory Comprehension: Appears within functional limits for tasks assessed Yes/No Questions: Impaired Basic Biographical Questions: 76-100% accurate Basic Immediate Environment Questions: 75-100% accurate Complex Questions: 75-100% accurate Commands: Impaired One Step Basic Commands: 75-100% accurate Two Step Basic Commands: 75-100% accurate Multistep Basic Commands: 25-49% accurate Conversation: Complex EffectiveTechniques: Repetition;Slowed speech Visual Recognition/Discrimination Discrimination: Within Function Limits Reading Comprehension Reading Status: Within funtional limits Expression Expression Primary Mode of Expression: Verbal Verbal Expression Overall Verbal Expression: Impaired Initiation: No impairment Automatic Speech: Name Repetition: Impaired Level of Impairment: Sentence level Naming: Impairment Confrontation: Impaired Divergent: 25-49% accurate Pragmatics: No impairment Written Expression Dominant Hand: Right Oral Motor    Care Tool Care Tool Cognition Ability to hear (with hearing aid or hearing appliances if normally used Ability to hear (with hearing aid or hearing appliances if normally used): 0. Adequate - no difficulty in normal conservation, social interaction, listening to TV   Expression of Ideas and Wants Expression of Ideas and Wants: 3. Some difficulty - exhibits some difficulty with expressing needs and ideas (e.g, some words or finishing thoughts) or speech is not clear   Understanding Verbal and Non-Verbal Content Understanding Verbal and Non-Verbal Content: 3. Usually understands - understands most conversations, but misses some part/intent of message. Requires cues at times to  understand  Memory/Recall Ability Memory/Recall Ability : Current season;Location of own room;Staff names and faces;That he or she is in a hospital/hospital unit    Bedside Swallowing Assessment General Date of Onset: 03/25/22 Previous Swallow Assessment: 03/25/22 HEAD CT: Similar left thalamic hemorrhage and associated edema and mild mass  effect. Slightly increased intraventricular extension. No  hydrocephalus. History of Recent Intubation: No Oral Cavity - Dentition: Adequate natural dentition Self-Feeding Abilities: Needs assist Vision: Functional for self-feeding Patient Positioning: Upright in chair/Tumbleform Baseline Vocal Quality: Normal Volitional Swallow: Able to elicit  Oral Care Assessment Does patient have any of the following "high(er) risk" factors?: None of the above Does patient have any of the following "at risk" factors?: Oxygen therapy - cannula, mask, simple oxygen devices;Other - dysphagia Patient is HIGH RISK: Non-ventilated: Order set for Adult Oral Care Protocol initiated - "High Risk Patients - Non-Ventilated" option selected  (see row information) Patient is AT RISK: Order set for Adult Oral Care Protocol initiated -  "At Risk Patients" option selected (see row information) Ice Chips Ice chips: Not tested Thin Liquid Thin Liquid: Within functional limits Presentation: Straw;Cup Nectar Thick Nectar Thick Liquid: Not tested Honey Thick Honey Thick Liquid: Not tested Puree Puree: Not tested Solid Solid: Within functional limits Presentation: Spoon BSE Assessment Risk for Aspiration Impact on safety and function: Mild aspiration risk Other Related Risk Factors: Lethargy  Short Term Goals: Week 1: SLP Short Term Goal 1 (Week 1): Pt will demonstrate recall of novel, daily information with min A verbal cues for use of external aids. SLP Short Term Goal 2 (Week 1): Pt will demonstrate basic problem solving skills with supervision A verbal cues. SLP Short  Term Goal 3 (Week 1): Pt will self-monitor and self-correct verbal and functional errors with supervision A verbal cues. SLP Short Term Goal 4 (Week 1): Pt will demonstrate word finding  skills in higher level task (ex: divergent/convergent naming and abstract topics) with min A semantic cues. SLP Short Term Goal 5 (Week 1): Pt will demonstrate 85% speech intelligibility sentence level with supervision A verbal cues for over articulation and rate. SLP Short Term Goal 6 (Week 1): Pt will consume regular textures and thin liquids with no overt s/s aspiration and mod I use of swallow strategies.  Refer to Care Plan for Long Term Goals  Recommendations for other services: None   Discharge Criteria: Patient will be discharged from SLP if patient refuses treatment 3 consecutive times without medical reason, if treatment goals not met, if there is a change in medical status, if patient makes no progress towards goals or if patient is discharged from hospital.  The above assessment, treatment plan, treatment alternatives and goals were discussed and mutually agreed upon: by patient and by family  Ansh Fauble  [facility] 04/03/2022, 4:27 PM

## 2022-04-03 NOTE — Progress Notes (Signed)
Pt refused ensure, states she will not drink them because she doesn't like them, offered boost instead. Pt agreeable. Paged dietitian.Sheela Stack, LPN

## 2022-04-04 DIAGNOSIS — I69391 Dysphagia following cerebral infarction: Secondary | ICD-10-CM | POA: Diagnosis not present

## 2022-04-04 DIAGNOSIS — I10 Essential (primary) hypertension: Secondary | ICD-10-CM | POA: Diagnosis not present

## 2022-04-04 DIAGNOSIS — I610 Nontraumatic intracerebral hemorrhage in hemisphere, subcortical: Secondary | ICD-10-CM | POA: Diagnosis not present

## 2022-04-04 DIAGNOSIS — D638 Anemia in other chronic diseases classified elsewhere: Secondary | ICD-10-CM | POA: Diagnosis not present

## 2022-04-04 LAB — URINALYSIS, ROUTINE W REFLEX MICROSCOPIC
Bilirubin Urine: NEGATIVE
Glucose, UA: NEGATIVE mg/dL
Hgb urine dipstick: NEGATIVE
Ketones, ur: NEGATIVE mg/dL
Leukocytes,Ua: NEGATIVE
Nitrite: NEGATIVE
Protein, ur: NEGATIVE mg/dL
Specific Gravity, Urine: 1.011 (ref 1.005–1.030)
pH: 5 (ref 5.0–8.0)

## 2022-04-04 LAB — GLUCOSE, CAPILLARY: Glucose-Capillary: 92 mg/dL (ref 70–99)

## 2022-04-04 NOTE — Discharge Instructions (Addendum)
WEIGH DAILY, every am, wearing the same amount of clothing, standing weight if possible Record weights, and bring it to office appointments Contact Virl Axe, MD for weight gain of 3 lbs in a day or 5 lbs in a week Limit sodium to 500 mg per meal, total 2000 mg per day Limit all liquids to 1.5-2 liters/quarts per day   Inpatient Rehab Discharge Instructions  [name] Discharge date and time:  04/30/2022  Activities/Precautions/ Functional Status: Activity: no lifting, driving, or strenuous exercise until cleared by MD Diet: Regular Wound Care: none needed Functional status:  ___ No restrictions     ___ Walk up steps independently __x_ 24/7 supervision/assistance   ___ Walk up steps with assistance ___ Intermittent supervision/assistance  ___ Bathe/dress independently ___ Walk with walker     ___ Bathe/dress with assistance ___ Walk Independently    ___ Shower independently ___ Walk with assistance    __x_ Shower with assistance _x__ No alcohol     ___ Return to work/school ________  Special Instructions:  No driving, alcohol consumption or tobacco use.   STROKE/TIA DISCHARGE INSTRUCTIONS SMOKING Cigarette smoking nearly doubles your risk of having a stroke & is the single most alterable risk factor  If you smoke or have smoked in the last 12 months, you are advised to quit smoking for your health. Most of the excess cardiovascular risk related to smoking disappears within a year of stopping. Ask you doctor about anti-smoking medications [place] Quit Line: 1-800-QUIT NOW Free Smoking Cessation Classes [phone]  CHOLESTEROL Know your levels; limit fat & cholesterol in your diet  Lipid Panel     Component Value Date/Time   CHOL 177 03/26/2022 0433   TRIG 150 (H) 03/26/2022 0433   HDL 53 03/26/2022 0433   CHOLHDL 3.3 03/26/2022 0433   VLDL 30 03/26/2022 0433   LDLCALC 94 03/26/2022 0433     Many patients benefit from treatment even if their cholesterol is at  goal. Goal: Total Cholesterol (CHOL) less than 160 Goal:  Triglycerides (TRIG) less than 150 Goal:  HDL greater than 40 Goal:  LDL (LDLCALC) less than 100   BLOOD PRESSURE American Stroke Association blood pressure target is less that 120/80 mm/Hg  Your discharge blood pressure is:  BP: 127/68 Monitor your blood pressure Limit your salt and alcohol intake Many individuals will require more than one medication for high blood pressure  DIABETES (A1c is a blood sugar average for last 3 months) Goal HGBA1c is under 7% (HBGA1c is blood sugar average for last 3 months)  Diabetes: No known diagnosis of diabetes    Lab Results  Component Value Date   HGBA1C 5.9 (H) 03/26/2022    Your HGBA1c can be lowered with medications, healthy diet, and exercise. Check your blood sugar as directed by your physician Call your physician if you experience unexplained or low blood sugars.  PHYSICAL ACTIVITY/REHABILITATION Goal is 30 minutes at least 4 days per week  Activity: Increase activity slowly, Therapies: Physical Therapy: Nursing Facility, Occupational Therapy: Nursing Facility, and Speech Therapy: Nursing Facility Return to work: n/a Activity decreases your risk of heart attack and stroke and makes your heart stronger.  It helps control your weight and blood pressure; helps you relax and can improve your mood. Participate in a regular exercise program. Talk with your doctor about the best form of exercise for you (dancing, walking, swimming, cycling).  DIET/WEIGHT Goal is to maintain a healthy weight  Your discharge diet is:  Diet Order  Diet regular Room service appropriate? Yes; Fluid consistency: Thin  Diet effective now                  thin liquids Your height is:  Height: '5\' 6"'$  (167.6 cm) Your current weight is: Weight: 64.1 kg Your Body Mass Index (BMI) is:  BMI (Calculated): 22.83 Following the type of diet specifically designed for you will help prevent another  stroke. Your goal weight range is:   Your goal Body Mass Index (BMI) is 19-24. Healthy food habits can help reduce 3 risk factors for stroke:  High cholesterol, hypertension, and excess weight.  RESOURCES Stroke/Support Group:  Call [phone]   STROKE EDUCATION PROVIDED/REVIEWED AND GIVEN TO PATIENT Stroke warning signs and symptoms How to activate emergency medical system (call 911). Medications prescribed at discharge. Need for follow-up after discharge. Personal risk factors for stroke. Pneumonia vaccine given: No Flu vaccine given: No My questions have been answered, the writing is legible, and I understand these instructions.  I will adhere to these goals & educational materials that have been provided to me after my discharge from the hospital.      My questions have been answered and I understand these instructions. I will adhere to these goals and the provided educational materials after my discharge from the hospital.  Patient/Caregiver Signature _______________________________ Date __________  Clinician Signature _______________________________________ Date __________  Please bring this form and your medication list with you to all your follow-up doctor's appointments.

## 2022-04-04 NOTE — Progress Notes (Signed)
Inpatient Rehabilitation Care Coordinator Assessment and Plan Patient Details  Name: [name] MRN: [id number] Date of Birth: 1923-07-13  Today's Date: 04/04/2022  Hospital Problems: Principal Problem:   ICH (intracerebral hemorrhage) [facility])  Past Medical History:  Past Medical History:  Diagnosis Date   Closed fracture of left distal radius 12/29/2019   DVT (deep venous thrombosis) ([place]) 09/2015   RLE   Dysrhythmia    Afib   GERD (gastroesophageal reflux disease)    Hip fracture ([place]) 12/02/2020   Hypertension    Hypothyroidism    Melanoma ([place]) 06/16/2017   Facial melanoma, removed by Dr. Harvel Quale   Peripheral vascular disease [facility])    peripheral neuropathy   Past Surgical History:  Past Surgical History:  Procedure Laterality Date   ABDOMINAL HYSTERECTOMY     APPENDECTOMY     BACK SURGERY     BREAST SURGERY     left breast biopsy   CARDIOVERSION N/A 05/09/2020   Procedure: CARDIOVERSION;  Surgeon: Skeet Latch, MD;  Location: [place];  Service: Cardiovascular;  Laterality: N/A;   COLOSTOMY N/A 01/06/2020   Procedure: Colostomy;  Surgeon: Greer Pickerel, MD;  Location: [place];  Service: General;  Laterality: N/A;   EYE SURGERY     cataract   HARDWARE REMOVAL Left 01/01/2020   Procedure: HARDWARE REMOVAL;  Surgeon: Iran Planas, MD;  Location: [place];  Service: Orthopedics;  Laterality: Left;   HIP ARTHROPLASTY Left 12/03/2020   Procedure: ARTHROPLASTY BIPOLAR HIP (HEMIARTHROPLASTY);  Surgeon: Renette Butters, MD;  Location: [place];  Service: Orthopedics;  Laterality: Left;   HIP ARTHROPLASTY Left    partial   IR KYPHO EA ADDL LEVEL THORACIC OR LUMBAR  06/27/2021   IR RADIOLOGIST EVAL & MGMT  07/13/2021   LAPAROTOMY N/A 01/06/2020   Procedure: Exploratory Laparotomy, Open Sigmoid colectomy;  Surgeon: Greer Pickerel, MD;  Location: [place];  Service: General;  Laterality: N/A;   LIPOMA EXCISION Left 04/25/2015   Procedure: EXCISION OF LIPOMA LEFT ARM;  Surgeon: Donnie Mesa, MD;  Location: [place];  Service: General;  Laterality: Left;   LUMBAR LAMINECTOMY/DECOMPRESSION MICRODISCECTOMY  11/20/2011   Procedure: LUMBAR LAMINECTOMY/DECOMPRESSION MICRODISCECTOMY;  Surgeon: Laurice Record [place];  Location: [facility];  Service: Orthopedics;  Laterality: N/A;  Decompressive Laminectomy L2 to the Sacrum (X-Ray)   LYSIS OF ADHESION N/A 01/17/2021   Procedure: RIGID PROCTOSCOPY;  Surgeon: Michael Boston, MD;  Location[facility];  Service: General;  Laterality: N/A;   OPEN REDUCTION INTERNAL FIXATION (ORIF) DISTAL RADIAL FRACTURE Left 01/01/2020   Procedure: OPEN REDUCTION INTERNAL FIXATION (ORIF) DISTAL RADIAL FRACTURE;  Surgeon: Iran Planas, MD;  Location: [place];  Service: Orthopedics;  Laterality: Left;   OTHER SURGICAL HISTORY     left wrist surgery - has plate in left wrist    OTHER SURGICAL HISTORY     right knee surgery due to torn cartilage    TONSILLECTOMY     WRIST FRACTURE SURGERY Left    XI ROBOTIC ASSISTED COLOSTOMY TAKEDOWN N/A 01/17/2021   Procedure: XI ROBOTIC ASSISTED OSTOMY TAKEDOWN, LYSIS OF ADHESIONS, RECTOSIGMOID RESECTION, BILATERAL TAP BLOCK;  Surgeon: Michael Boston, MD;  Location[facility];  Service: General;  Laterality: N/A;   Social History:  reports that she has never smoked. She has never used smokeless tobacco. She reports that she does not drink alcohol and does not use drugs.  Family / Support Systems Marital Status: Widow/Widower How Long?: 27 years Patient Roles: Parent Spouse/Significant Other: widowed Children: 3  adult children- Jana Half (dtr) [phone]; Lucianne Lei (son) [phone]; Gershon Mussel (son ) [phone]. All live in [place] Other Supports: hired caregivers Anticipated Caregiver: Hired caregiver 4 days per week 9am-3pm. 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 Education: [place] - How often do you need to have someone help you when you read instructions, pamphlets, or other written material from your doctor or pharmacy?: Never Writes: Yes Employment Status: Retired Date Retired/Disabled/Unemployed: 2003 Age Retired: 70 Public relations account executive Issues: Denies Guardian/Conservator: N/A   Abuse/Neglect Abuse/Neglect Assessment Can Be Completed: Yes Physical Abuse: Denies Verbal Abuse: Denies Sexual Abuse: Denies Exploitation of patient/patient's resources: Denies Self-Neglect: Denies  Patient response to: Social Isolation - How often do you feel lonely or isolated from those around you?: Never  Emotional Status Pt's affect, behavior and adjustment status: Pt was tired at time of visit as she just finished therapy and was doing off to sleep at times. Recent Psychosocial Issues: Denies Psychiatric History: Denies Substance Abuse History: Denies  Patient / Family Perceptions, Expectations & Goals Pt/Family understanding of illness & functional limitations: Pt and family have a general understanding of pt care needs Premorbid pt/family roles/activities: Independent with mobility and stairs; assistance with ADLs/IADLs Anticipated changes in roles/activities/participation: Assistance with ADLs/IADLs Pt/family expectations/goals: Pt goal "learning how to take care of myself." Dtr goal " be back at home and use RW, and take care of herself."  [employer]: None Premorbid Home Care/DME Agencies: None Transportation available at discharge: TBD Is the patient able to respond to transportation needs?: Yes In the past 12 months, has lack of transportation kept you from medical appointments or from getting medications?: No In the past 12 months, has lack of  transportation kept you from meetings, work, or from getting things needed for daily living?: No Resource referrals recommended: Neuropsychology  Discharge Planning Living Arrangements: Alone, Non-relatives/Friends Support Systems: Children, Home care staff Type of Residence: Private residence Insurance Resources: Multimedia programmer (specify) ([place]) Financial Resources: Social Security Financial Screen Referred: No Living Expenses: Own Money Management: Other (Comment) (Dtr Orthoptist) Does the patient have any problems obtaining your medications?: No Home Management: Pt had assistance with home care needs Patient/Family Preliminary Plans: No changes Care Coordinator Anticipated Follow Up Needs: HH/OP  Clinical Impression Pt is not a veteran. HCPOA- all three children. DME: RW, shower chair, grab bars in shower, and grab bars around toilet.  Buryl Bamber A Areal [place] 04/04/2022, 11:10 AM

## 2022-04-04 NOTE — Progress Notes (Signed)
Physical Therapy Session Note  Patient Details  Name: [name] MRN: [id number] Date of Birth: 08/13/1923  Today's Date: 04/04/2022 PT Individual Time: 1100-1110 PT Individual Time Calculation (min): 10 min   Short Term Goals: Week 1:  PT Short Term Goal 1 (Week 1): pt will roll side to side w/ CGA. PT Short Term Goal 2 (Week 1): Pt will transfer sup/sidelying to sit w/ min A PT Short Term Goal 3 (Week 1): Pt will transfer sit to stand w/ mod A PT Short Term Goal 4 (Week 1): Pt will perform SPT w/ mod A  Skilled Therapeutic Interventions/Progress Updates:      Pt seen for unscheduled PT session. 2 son's at the bedside. Upon entrance, pt shaking her head 'no' and verbalizing that she isn't doing therapy today. She appears quite anxious and inconsolable. She reports she's "89's year old and needs to rest." Son's at bedside also requesting to hold therapy due to her "confusion and delirium." Family believes this behavior is related to UTI - but they also confirm that this behavior has been present since admission. Discussed possibility of hospital delirium vs impairments from ICH. Family receptive to education. Pt remained as found in room, all needs met, family at bedside.   Therapy Documentation Precautions:  Precautions Precautions: Fall Precaution Comments: R hemi Restrictions Weight Bearing Restrictions: No General:     Therapy/Group: Individual Therapy   P  04/04/2022, 11:13 AM  

## 2022-04-04 NOTE — Progress Notes (Signed)
Patient c/o of indigestion, prn Maalox given X1 with minimal relief. Provide comfort measures and changed patient position. Patient and family at bedside and voiced that patient ate some french fries and some amount of milkshake. Patient c/o dome indigestion and  some chest tightness. Nitroglycerin given X1 with some relief noticed and voiced On call provider Danella Sensing, NP made aware. Family and patient made aware that patient will be monitor throughout shift

## 2022-04-04 NOTE — Progress Notes (Signed)
Physical Therapy Session Note  Patient Details  Name: [name] MRN: [id number] Date of Birth: 15-Nov-1922  Today's Date: 04/04/2022 PT Missed Time: 75 Minutes Missed Time Reason: Patient fatigue;Patient unwilling to participate  Short Term Goals: Week 1:  PT Short Term Goal 1 (Week 1): pt will roll side to side w/ CGA. PT Short Term Goal 2 (Week 1): Pt will transfer sup/sidelying to sit w/ min A PT Short Term Goal 3 (Week 1): Pt will transfer sit to stand w/ mod A PT Short Term Goal 4 (Week 1): Pt will perform SPT w/ mod A  Skilled Therapeutic Interventions/Progress Updates:     Pt sleeping and family members request to defer therapy at this time. PT will follow up as able.  Therapy Documentation Precautions:  Precautions Precautions: Fall Precaution Comments: R hemi Restrictions Weight Bearing Restrictions: No General: PT Amount of Missed Time (min): 75 Minutes PT Missed Treatment Reason: Patient fatigue;Patient unwilling to participate    Therapy/Group: Individual Therapy  Breck Coons, PT, DPT 04/04/2022, 4:27 PM

## 2022-04-04 NOTE — Progress Notes (Signed)
SLP Cancellation Note  Patient Details Name: [name] MRN: [id number] DOB: Feb 13, 1923   Cancelled treatment:      Patient missed 60 minutes of skilled SLP intervention due to fatigue. Patient with decreased arousal and alertness due to lethargy, will re-attempt as able. Also spoke to family regarding possibility of changing patient's schedule to 15/7 if current therapy schedule is too intense.                                                                                                 [place], [place] 04/04/2022, 3:10 PM

## 2022-04-04 NOTE — Progress Notes (Signed)
PROGRESS NOTE   Subjective/Complaints: Some reported anxiety last night. Resting soundly when I came in. After I woke her up, she asked if I had talked to her family and stated that she wanted to go home. "I'm 88 years old, and I can't do this."  ROS: Limited due to cognitive/behavioral    Objective:   DG Chest Port 1 View  Result Date: 04/02/2022 CLINICAL DATA:  Acute respiratory distress EXAM: PORTABLE CHEST 1 VIEW COMPARISON:  Chest 11/23/2020 FINDINGS: The image is mismarked for laterality. Patient does not have situs inversus based on prior chest x-ray and CT studies. Mild cardiac enlargement. Negative for heart failure. Mild left lower lobe airspace disease. Right lung clear. Chronic left rib fracture IMPRESSION: Mild left lower lobe airspace disease likely atelectasis. Possible small left effusion. Negative for heart failure. Electronically Signed   By: Franchot Gallo M.D.   On: 04/02/2022 19:30    Recent Labs    04/03/22 0529  WBC 8.8  HGB 8.9*  HCT 27.9*  PLT 260   Recent Labs    04/03/22 0529  NA 138  K 3.7  CL 110  CO2 23  GLUCOSE 94  BUN 15  CREATININE 0.85  CALCIUM 8.9    Intake/Output Summary (Last 24 hours) at 04/04/2022 2725 Last data filed at 04/04/2022 0529 Gross per 24 hour  Intake 480 ml  Output 1050 ml  Net -570 ml        Physical Exam: Vital Signs Blood pressure (!) 141/62, pulse 72, temperature 99.1 F (37.3 C), temperature source Oral, resp. rate 16, height '5\' 6"'$  (1.676 m), weight 70.3 kg, SpO2 96 %.  Constitutional: No distress . Vital signs reviewed. HEENT: NCAT, EOMI, oral membranes moist Neck: supple Cardiovascular: RRR without murmur. No JVD    Respiratory/Chest: CTA Bilaterally without wheezes or rales. Normal effort    GI/Abdomen: BS +, non-tender, non-distended Ext: no clubbing, cyanosis, or edema Psych: anxious and irritable Skin: Clean and intact without signs of  breakdown Neuro:  alert, oriented to person. Follows basic commands. continued dysarthria, word finding deficits but language more fluid.  RUE grossly 2+ to 3/5. RLE 2/5. + temp/pain. No abnl tone Musculoskeletal: no pain with AROM/PROM of limbs, trunk    Assessment/Plan: 1. Functional deficits which require 3+ hours per day of interdisciplinary therapy in a comprehensive inpatient rehab setting. Physiatrist is providing close team supervision and 24 hour management of active medical problems listed below. Physiatrist and rehab team continue to assess barriers to discharge/monitor patient progress toward functional and medical goals  Care Tool:  Bathing    Body parts bathed by patient: Right arm, Left arm, Chest, Abdomen, Right upper leg, Left upper leg, Face   Body parts bathed by helper: Front perineal area, Buttocks, Right lower leg, Left lower leg     Bathing assist Assist Level: Moderate Assistance - Patient 50 - 74%     Upper Body Dressing/Undressing Upper body dressing   What is the patient wearing?: Pull over shirt    Upper body assist Assist Level: Maximal Assistance - Patient 25 - 49%    Lower Body Dressing/Undressing Lower body dressing      What  is the patient wearing?: Pants     Lower body assist Assist for lower body dressing: 2 Helpers     Toileting Toileting    Toileting assist Assist for toileting: 2 Helpers     Transfers Chair/bed transfer  Transfers assist     Chair/bed transfer assist level: Total Assistance - Patient < 25%     Locomotion Ambulation   Ambulation assist   Ambulation activity did not occur: Safety/medical concerns          Walk 10 feet activity   Assist  Walk 10 feet activity did not occur: Safety/medical concerns        Walk 50 feet activity   Assist Walk 50 feet with 2 turns activity did not occur: Safety/medical concerns         Walk 150 feet activity   Assist Walk 150 feet activity did not  occur: Safety/medical concerns         Walk 10 feet on uneven surface  activity   Assist Walk 10 feet on uneven surfaces activity did not occur: Safety/medical concerns         Wheelchair     Assist Is the patient using a wheelchair?: Yes Type of Wheelchair: Manual    Wheelchair assist level: Minimal Assistance - Patient > 75% Max wheelchair distance: 20    Wheelchair 50 feet with 2 turns activity    Assist        Assist Level: Dependent - Patient 0%   Wheelchair 150 feet activity     Assist      Assist Level: Dependent - Patient 0%   Blood pressure (!) 141/62, pulse 72, temperature 99.1 F (37.3 C), temperature source Oral, resp. rate 16, height '5\' 6"'$  (1.676 m), weight 70.3 kg, SpO2 96 %.  Medical Problem List and Plan: 1. 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 Spoke to nurse who will reach out to family re: pt's comments above.   2.  Antithrombotics: -DVT/anticoagulation:  Pharmaceutical: Heparin             -antiplatelet therapy: none 3. Pain Management: Tylenol as needed. Neurontin 300 mg q HS 4. Mood: team providing ego support             -antipsychotic agents: n/a  -will ask for family to help support her  -would like to avoid med rx given age 21. Neuropsych: This patient is not capable of making decisions on her own behalf. 6. Skin/Wound Care: Routine skin care checks 7. Fluids/Electrolytes/Nutrition:               --dysphagia 3 diet with thin liquids  -seems to be tolerating thus far  -albumin low, changed to Boost Breeze per pt preference 8. Hypertension: continue diltiazem, hydralazine, nadolol. Home losartan held  -5/25 bp well controlled currently 9: Hyperlipidemia: continue atorvastatin 10: GI prophylaxis: continue pantoprazole 11: pAF: continue  diltiazem. No AC until resolution of ICH, outpt follow-up 12: Hypothyroidism: continue Synthroid 13: Urinary retention: continue Flomax             -given pt's anxiety today, will continue foley for now. 14: UTI:  Keflex completed 15: AKI: BUN and Cr continue downward trend             -  encouraging fluids.  16: Leukocytosis: no fever, downward trend; follow-up CBC 17: Anemia, chronic: no evidence of acute blood loss -hgb 8.9--continue to track  18: Polymyalgia rheumatica: prednisone 2 mg with lunch 19. Dysphagia: tolerating D3/thin diet             -advance per SLP    LOS: 2 days A FACE TO FACE EVALUATION WAS PERFORMED  Meredith Staggers 04/04/2022, 9:03 AM

## 2022-04-04 NOTE — Care Management (Signed)
Inpatient Rehabilitation Center Individual Statement of Services  Patient Name:  [name]  Date:  04/04/2022  Welcome to the [facility].  Our goal is to provide you with an individualized program based on your diagnosis and situation, designed to meet your specific needs.  With this comprehensive rehabilitation program, you will be expected to participate in at least 3 hours of rehabilitation therapies Monday-Friday, with modified therapy programming on the weekends.  Your rehabilitation program will include the following services:  Physical Therapy (PT), Occupational Therapy (OT), Speech Therapy (ST), 24 hour per day rehabilitation nursing, Therapeutic Recreaction (TR), Psychology, Neuropsychology, Care Coordinator, Rehabilitation Medicine, [place], and Other  Weekly team conferences will be held on Tuesdays to discuss your progress.  Your Inpatient Rehabilitation Care Coordinator will talk with you frequently to get your input and to update you on team discussions.  Team conferences with you and your family in attendance may also be held.  Expected length of stay: 3-4 weeks  Overall anticipated outcome: Minimal Assistance  Depending on your progress and recovery, your program may change. Your Inpatient Rehabilitation Care Coordinator will coordinate services and will keep you informed of any changes. Your Inpatient Rehabilitation Care Coordinator's name and contact numbers are listed  below.  The following services may also be recommended but are not provided by the [facility] will be made to provide these services after discharge if needed.  Arrangements include referral to agencies that provide these services.  Your insurance has been verified to be:  [employer]  Your  primary doctor is:  Annye Asa  Pertinent information will be shared with your doctor and your insurance company.  Inpatient Rehabilitation Care Coordinator:  Cathleen Corti [phone] or ([phone]  Information discussed with and copy given to patient by: Rana Snare, 04/04/2022, 10:26 AM

## 2022-04-04 NOTE — Progress Notes (Signed)
Spoke to [place], patient's daughter and she expressed  that her mom has an episode every other day since admission on previous floor with s/s of panicky and wanting to go home.  Daughter expressed that she is not sure if it is from her mom's disease process. Family assured Probation officer that this is not new and that either her ( daughter) or the patient's son will be here to comfort and speak with the patient. Will continue to comfort and monitor patient.

## 2022-04-04 NOTE — Discharge Summary (Incomplete)
Physician Discharge Summary  Patient ID: [name] MRN: [id number] DOB/AGE: [birth year] 88 y.o.  Admit date: 04/02/2022 Discharge date: 04/30/2022  Discharge Diagnoses:  Principal Problem:   ICH (intracerebral hemorrhage) ([place]) Active Problems:   Persistent atrial fibrillation (HCC) Active problems: Functional deficits secondary to left thalamic intracranial hemorrhage Dysphagia Hypertension Hyperlipidemia Paroxysmal atrial fibrillation Hypothyroidism Urinary retention Acute kidney injury Leukocytosis Anemia Polymyalgia rheumatica Mild hypoxia Lethargy Macular degeneration of right eye  Discharged Condition: stable  Significant Diagnostic Studies: ADDENDUM REPORT: 04/16/2022 19:01   ADDENDUM: Dictation error in the impression section of the report, which should state: NO evidence of acute displaced fracture.     Electronically Signed   By: Dahlia Bailiff M.D.   On: 04/16/2022 19:01    Addended by Regan Lemming, MD on 04/16/2022  7:03 PM   Study Result  Narrative & Impression  CLINICAL DATA:  Left arm pain times a few days.   EXAM: LEFT SHOULDER - 2+ VIEW   COMPARISON:  Multiple priors including CT chest May 11, 2020.   FINDINGS: There is no evidence of acute displaced fracture or dislocation. Chronic fracture deformity of the proximal humerus is unchanged from prior. 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 Chronic appearing left-sided rib fractures.   IMPRESSION: Evidence of acute displaced fracture.   Similar appearance of the chronic healed fracture deformity of the proximal humerus and severe degenerative change of the glenohumeral joint.   Electronically Signed: By: Dahlia Bailiff M.D. On: 04/16/2022 11:20       Labs:  Basic Metabolic Panel: Recent Labs  Lab 04/25/22 1446 04/27/22 0510 04/28/22 0547 04/29/22 0524  NA 140 138 141 142  K 4.5 3.8 3.7 3.8  CL 107 104 104 106  CO2 '24 26 24 28   '$ GLUCOSE 191* 93 94 96  BUN 20 23 27* 30*  CREATININE 1.47* 1.43* 1.42* 1.40*  CALCIUM 9.6 9.0 9.3 9.1  MG 2.0  --   --   --     CBC: Recent Labs  Lab 04/25/22 1446 04/29/22 0524  WBC 7.7 7.7  HGB 9.9* 9.0*  HCT 31.3* 27.6*  MCV 91.3 90.2  PLT 266 221    CBG: No results for input(s): "GLUCAP" in the last 168 hours.  ABG    Component Value Date/Time   PHART 7.42 04/10/2022 1105   PCO2ART 38 04/10/2022 1105   PO2ART 79 (L) 04/10/2022 1105   HCO3 24.6 04/10/2022 1105   TCO2 23 03/25/2022 0255   ACIDBASEDEF 0.5 04/09/2022 0957   O2SAT 97.4 04/10/2022 1105    Brief HPI:   [name] is a 88 y.o. female who presented to the emergency department on 03/25/2022 with new onset of right-sided weakness, aphasia and speech apraxia.  Imaging confirmed left basal ganglia intracranial hemorrhage and was in hypertensive emergency on arrival.  She was maintained at home on apixaban secondary to atrial fibrillation and this was reversed with Andexxa.  She was treated for urinary tract infection.  She developed urinary retention and Foley catheter was placed.   Hospital Course: [name] was admitted to rehab 04/02/2022 for inpatient therapies to consist of PT, ST and OT at least three hours five days a week. Past admission physiatrist, therapy team and rehab RN have worked together to provide customized collaborative inpatient rehab.  Shortly after admission to unit, the patient complained of increased shortness of breath and chest pressure.  Rapid response nurse was notified.  EKG performed  and serial troponins were within normal limits.  Chest x-ray performed without findings of acute pathology.  Vital signs stable.  Given 1 dose of sublingual nitroglycerin.  The patient exhibited anxiety regarding rehabilitation plan.  Reached out to family to support and encourage patient with therapies.  Foley was continued.  Discussed with family changing therapy sessions to 15/7 on 5/26.  Foley  discontinued 5/26 and voiding trial/timed voids initiated. Urinalysis normal.  Urine culture negative as well. Voiding spontaneously.   Developed acute mental status change on morning of 5/28. RR team alerted and code stroke called. CT and CTA head performed. Neurology on call consulted. Arousable with sternal rub but not following commands. Was back to neuro baseline in approximately one hour. Imaging negative for re-bleed and expected evolution of the subacute left thalamic hematoma noted. CTA head and neck without emergent finding. Thryoid function tests obtained>>TSH normal and free T4 elevated. ABGs obtained on 5/30 due to ongoing feelings of SOB and pO2 of 77.  Patient with lethargy on 5/31 with stable vital signs and normal SaO2 on room air.  Repeat ABGs performed within normal limits except PO2 of 79.  She was refusing to toilet and requiring in and out catheterizations. Within one to two hours, reassessment found her alert and fully oriented and had used the bedpan for voiding.  Neuropsychology consultation completed 6/2.  Still requiring intermittent in and out catheterization 6/6.  X-ray of left shoulder obtained due to pain and demonstrates significant DJD and old femoral neck fracture.  Voltaren gel prescribed.  Urine observed to be cloudy in appearance 6/8 and urine culture obtained. Complained of LE pain and her gabapentin was increased to 300 mg at nighttime on 6/9, but she did not tolerate this dose increase cognitively although 300 mg was her dose at home. Reduced back to 200 mg.  Melatonin discontinued due to questionable side effects. Urine culture resulted on 6/11 and was positive for Citrobacter. Keflex allergy so given one dose of fosfomycin.   Blood pressures were monitored on TID basis and well-controlled on diltiazem, hydralazine. Lower BPs noted on 6/2 and nadolol was held and hydralazine decreased to 25 mg.  Home losartan held.  Rehab course: During patient's stay in rehab weekly  team conferences were held to monitor patient's progress, set goals and discuss barriers to discharge. At admission, patient required max assist with basic self-care skills and total assist with mobility.  She has had improvement in activity tolerance, balance, postural control as well as ability to compensate for deficits. She has had improvement in functional use RUE and RLE as well as improvement in awareness and overall strength.  Disposition:  There are no questions and answers to display.         Diet:  Special Instructions:  Discharge Instructions     Ambulatory referral to Neurology   Complete by: As directed    Follow up with Dr[facility] Leonie Man at [facility] in 3 weeks. Too complicated for RN to follow. Thanks.      Allergies as of 04/29/2022       Reactions   Keflex [cephalexin] Nausea Only, Other (See Comments)   Pt ended up in ER w/ CHEST PAIN.  Patient experienced chest pain again with re-challenge of cephalexin in May 2023.   Codeine Other (See Comments)   "Nightmares, imagined things"     Med Rec must be completed prior to using this [facility]***       Follow-up Information     [name],  MD Follow up.   Specialty: Family Medicine Why: Call office to make arrangments for hospital follow-up appointment Contact information: [address] [place] [address] [phone]         Deboraha Sprang, MD .   Specialty: Cardiology Contact information: [address] [place] [address] [phone]         Garvin Fila, MD. Schedule an appointment as soon as possible for a visit in 2 week(s).   Specialties: Neurology, Radiology Why: stroke clinic Contact information: [address] [place] [address] [phone]                 Signed: Barbie Banner 04/29/2022, 9:13 AM

## 2022-04-04 NOTE — Progress Notes (Signed)
Occupational Therapy Session Note  Patient Details  Name: [name] MRN: [id number] Date of Birth: 07-Jan-1923  Today's Date: 04/04/2022 OT Individual Time: 4360-6770 OT Individual Time Calculation (min): 40 min    Short Term Goals: Week 1:  OT Short Term Goal 1 (Week 1): Pt will don a shirt with no more than min cueing for hemi technique and min A OT Short Term Goal 2 (Week 1): Pt will don pants with mod A OT Short Term Goal 3 (Week 1): Pt will maintain sitting balance EOB during ADLs with no more than CGA OT Short Term Goal 4 (Week 1): Pt will complete stand pivot transfer with max of 1  Skilled Therapeutic Interventions/Progress Updates:     Pt received in bed refusing to participate in any OOB or EOB tx. Pt perseverative on getting "out of here and going to a hospital where they will take care of me" and " has someone called my family, they need to come and get me."  Therapeutic activity As son arrived, Pt continues to be anxious and inconsolable and wanting to go home. OT uses therapeutic use of self, empathetic listening and verbal repetition to confirm pt's wishes to assist with calming pt/allowing to feel heard. Pt/son educated on different POC options such as 15/7 to allow for increased rest throughout the day, however pt still refusing all tx. Also educated on deep breathing with extended exhale to improve calming/reduce anxiety as pt visibly hyperventilating. Also initiated conversation to involve palliative care to improve discussion of pt wants/wishes regarding care coordination.  Pt left at end of session in bed with exit alarm on, call light in reach and all needs met   Therapy Documentation Precautions:  Precautions Precautions: Fall Precaution Comments: R hemi Restrictions Weight Bearing Restrictions: No General:     Therapy/Group: Individual Therapy  Tonny Branch 04/04/2022, 6:49 AM

## 2022-04-04 NOTE — Progress Notes (Signed)
Patient ID: [name], female   DOB: 05-09-1923, 88 y.o.   MRN: [id number]  SW met with pt children in room while pt sleeping to provide statement of service and discuss ELOS, and inform will f/u after team conference.  Loralee Pacas, MSW, [place] Office: [phone] Cell: [phone] Fax: [phone]

## 2022-04-05 DIAGNOSIS — I69391 Dysphagia following cerebral infarction: Secondary | ICD-10-CM | POA: Diagnosis not present

## 2022-04-05 DIAGNOSIS — I610 Nontraumatic intracerebral hemorrhage in hemisphere, subcortical: Secondary | ICD-10-CM | POA: Diagnosis not present

## 2022-04-05 DIAGNOSIS — D638 Anemia in other chronic diseases classified elsewhere: Secondary | ICD-10-CM | POA: Diagnosis not present

## 2022-04-05 DIAGNOSIS — I10 Essential (primary) hypertension: Secondary | ICD-10-CM | POA: Diagnosis not present

## 2022-04-05 LAB — URINE CULTURE: Culture: NO GROWTH

## 2022-04-05 LAB — GLUCOSE, CAPILLARY: Glucose-Capillary: 147 mg/dL — ABNORMAL HIGH (ref 70–99)

## 2022-04-05 NOTE — IPOC Note (Signed)
Overall Plan of Care [facility]) Patient Details Name: [name] MRN: [id number] DOB: 11-25-1922  Admitting Diagnosis: ICH (intracerebral hemorrhage) [facility])  Hospital Problems: Principal Problem:   ICH (intracerebral hemorrhage) ([place])     Functional Problem List: Nursing Bladder, Bowel, Edema, Endurance, Medication Management, Motor, Safety  PT Balance, Safety, Endurance, Motor  OT Safety, Perception, Balance, Cognition, Endurance, Vision, Motor, Pain  SLP Cognition  TR         Basic ADL's: OT Eating, Grooming, Bathing, Dressing, Toileting     Advanced  ADL's: OT       Transfers: PT Bed Mobility, Bed to Chair, Car, Manufacturing systems engineer, Metallurgist: PT Ambulation, Emergency planning/management officer, Stairs     Additional Impairments: OT Fuctional Use of Upper Extremity  SLP Swallowing, Communication, Social Cognition comprehension, expression    TR      Anticipated Outcomes Item Anticipated Outcome  Self Feeding (S)  Swallowing  Mod I   Basic self-care  min A  Toileting  min A   Bathroom Transfers min A  Bowel/Bladder  min assist  Transfers  min CGA for all OOB transfers.  Locomotion  min A w/ LRAD  Communication  Supervision A - Mod I  Cognition  Supervision A - Mod I  Pain  n/a  Safety/Judgment  min assist   Therapy Plan: PT Intensity: Minimum of 1-2 x/day ,45 to 90 minutes PT Frequency: 5 out of 7 days PT Duration Estimated Length of Stay: 4 weeks. OT Intensity: Minimum of 1-2 x/day, 45 to 90 minutes OT Frequency: 5 out of 7 days OT Duration/Estimated Length of Stay: 3-4 weeks SLP Intensity: Minumum of 1-2 x/day, 30 to 90 minutes SLP Frequency: 3 to 5 out of 7 days SLP Duration/Estimated Length of Stay: 3-4 weeks   Team Interventions: Nursing Interventions Patient/Family Education, Bladder Management, Bowel Management, Disease Management/Prevention, Medication Management, Dysphagia/Aspiration Precaution Training, Discharge Planning  PT  interventions Ambulation/gait training, Discharge planning, Functional mobility training, Therapeutic Activities, UE/LE Strength taining/ROM, Training and development officer, Community reintegration, Neuromuscular re-education, Barrister's clerk education, IT trainer, Therapeutic Exercise, Wheelchair propulsion/positioning  OT Interventions Training and development officer, Discharge planning, Pain management, Self Care/advanced ADL retraining, Therapeutic Activities, UE/LE Coordination activities, Visual/perceptual remediation/compensation, Therapeutic Exercise, Patient/family education, Functional mobility training, Cognitive remediation/compensation, Academic librarian, Engineer, drilling, Neuromuscular re-education, Psychosocial support, UE/LE Strength taining/ROM, Wheelchair propulsion/positioning  SLP Interventions Cognitive remediation/compensation, English as a second language teacher, Functional tasks, Internal/external aids, Speech/Language facilitation, Patient/family education, Dysphagia/aspiration precaution training  TR Interventions    SW/CM Interventions Discharge Planning, Psychosocial Support, Patient/Family Education   Barriers to Discharge MD  Medical stability  Nursing Decreased caregiver support, Home environment access/layout, Incontinence, Lack of/limited family support, Weight bearing restrictions 1 level, 2 steps, 2 rails. Children and hired aides to provide care at discharge.  PT      OT      SLP      SW       Team Discharge Planning: Destination: PT-Home ,OT- Home , SLP-Home Projected Follow-up: PT-Home health PT, OT-  Home health OT, SLP-24 hour supervision/assistance, Outpatient SLP Projected Equipment Needs: PT-To be determined, OT- To be determined, SLP-None recommended by SLP Equipment Details: PT- , OT-  Patient/family involved in discharge planning: PT- Patient,  OT-Patient, SLP-Patient, Family member/caregiver  MD ELOS: 3-4 weeks Medical Rehab Prognosis:   Good Assessment: The patient has been admitted for CIR therapies with the diagnosis of left CVA and right hemiparesis and cognitive-linguistic deficits. The team will be addressing functional mobility, strength, stamina, balance, safety,  adaptive techniques and equipment, self-care, bowel and bladder mgt, patient and caregiver education, NMR, cognition, language, speech, behavior. Goals have been set at min assist for mobility and self-care, supervision for cognition. Anticipated discharge destination is home with family. Pt has been struggling with intermittent confusion and anxiety which have impacted participation at times.        See Team Conference Notes for weekly updates to the plan of care

## 2022-04-05 NOTE — Progress Notes (Signed)
Speech Language Pathology Daily Session Note  Patient Details  Name: [name] MRN: [id number] Date of Birth: [birth year]  Today's Date: 04/05/2022 SLP Individual Time: 1330-1430 SLP Individual Time Calculation (min): 60 min  Short Term Goals: Week 1: SLP Short Term Goal 1 (Week 1): Pt will demonstrate recall of novel, daily information with min A verbal cues for use of external aids. SLP Short Term Goal 2 (Week 1): Pt will demonstrate basic problem solving skills with supervision A verbal cues. SLP Short Term Goal 3 (Week 1): Pt will self-monitor and self-correct verbal and functional errors with supervision A verbal cues. SLP Short Term Goal 4 (Week 1): Pt will demonstrate word finding skills in higher level task (ex: divergent/convergent naming and abstract topics) with min A semantic cues. SLP Short Term Goal 5 (Week 1): Pt will demonstrate 85% speech intelligibility sentence level with supervision A verbal cues for over articulation and rate. SLP Short Term Goal 6 (Week 1): Pt will consume regular textures and thin liquids with no overt s/s aspiration and mod I use of swallow strategies.  Skilled Therapeutic Interventions: Skilled ST treatment focused on cognitive-communication goals. Pt stated "I was ugly yesterday" referring to her mood and actions toward staff. She stated she is feeling much better today and that she was just having a bad day yesterday which impacted her mood and willingness to participate. SLP facilitated session by providing min A verbal cues for word finding with divergent naming task. Pt exhibited occasional phonemic paraphasias and min A cues to implement speech intelligibility strategies (over-articulate) to repair speech breakdowns x5 during structured language tasks and during conversation. Pt engaged in functional discussion regarding intellectual awareness of deficits. Pt was able to identify deficits and stated "I'd need help with a lot" but unable to elaborate  with specifics suggestive of decreased emergent awareness and problem solving. Patient was left in recliner with alarm activated and immediate needs within reach at end of session. Continue per current plan of care.      Pain  None/denied  Therapy/Group: Individual Therapy  Patty Sermons 04/05/2022, 2:31 PM

## 2022-04-05 NOTE — Progress Notes (Addendum)
PROGRESS NOTE   Subjective/Complaints:  Pt had a reasonable night. Seems to be much calmer and in better spirits today. Family is at bedside. Asked if therapy could be a little less intense or sessions spread out  ROS: Limited due to cognitive/behavioral    Objective:   No results found.  Recent Labs    04/03/22 0529  WBC 8.8  HGB 8.9*  HCT 27.9*  PLT 260   Recent Labs    04/03/22 0529  NA 138  K 3.7  CL 110  CO2 23  GLUCOSE 94  BUN 15  CREATININE 0.85  CALCIUM 8.9    Intake/Output Summary (Last 24 hours) at 04/05/2022 0956 Last data filed at 04/05/2022 0708 Gross per 24 hour  Intake 120 ml  Output 625 ml  Net -505 ml        Physical Exam: Vital Signs Blood pressure (!) 146/61, pulse 62, temperature 97.9 F (36.6 C), temperature source Oral, resp. rate 16, height '5\' 6"'$  (1.676 m), weight 70.3 kg, SpO2 97 %.  Constitutional: No distress . Vital signs reviewed. HEENT: NCAT, EOMI, oral membranes moist Neck: supple Cardiovascular: RRR without murmur. No JVD    Respiratory/Chest: CTA Bilaterally without wheezes or rales. Normal effort    GI/Abdomen: BS +, non-tender, non-distended Ext: no clubbing, cyanosis, or edema Psych: pleasant and cooperative  Skin: Clean and intact without signs of breakdown Neuro:  alert, oriented to person. Follows basic commands. Speech clearer, less word finding deficits but language more fluid.  RUE grossly 2+ to 3/5. RLE 2/5. + temp/pain. No abnl tone Musculoskeletal: no pain with AROM/PROM of limbs, trunk    Assessment/Plan: 1. Functional deficits which require 3+ hours per day of interdisciplinary therapy in a comprehensive inpatient rehab setting. Physiatrist is providing close team supervision and 24 hour management of active medical problems listed below. Physiatrist and rehab team continue to assess barriers to discharge/monitor patient progress toward functional and  medical goals  Care Tool:  Bathing    Body parts bathed by patient: Right arm, Left arm, Chest, Abdomen, Right upper leg, Left upper leg, Face   Body parts bathed by helper: Front perineal area, Buttocks, Right lower leg, Left lower leg     Bathing assist Assist Level: Moderate Assistance - Patient 50 - 74%     Upper Body Dressing/Undressing Upper body dressing   What is the patient wearing?: Pull over shirt    Upper body assist Assist Level: Maximal Assistance - Patient 25 - 49%    Lower Body Dressing/Undressing Lower body dressing      What is the patient wearing?: Pants     Lower body assist Assist for lower body dressing: 2 Helpers     Toileting Toileting    Toileting assist Assist for toileting: 2 Helpers     Transfers Chair/bed transfer  Transfers assist     Chair/bed transfer assist level: Total Assistance - Patient < 25%     Locomotion Ambulation   Ambulation assist   Ambulation activity did not occur: Safety/medical concerns          Walk 10 feet activity   Assist  Walk 10 feet activity did not occur:  Safety/medical concerns        Walk 50 feet activity   Assist Walk 50 feet with 2 turns activity did not occur: Safety/medical concerns         Walk 150 feet activity   Assist Walk 150 feet activity did not occur: Safety/medical concerns         Walk 10 feet on uneven surface  activity   Assist Walk 10 feet on uneven surfaces activity did not occur: Safety/medical concerns         Wheelchair     Assist Is the patient using a wheelchair?: Yes Type of Wheelchair: Manual    Wheelchair assist level: Minimal Assistance - Patient > 75% Max wheelchair distance: 20    Wheelchair 50 feet with 2 turns activity    Assist        Assist Level: Dependent - Patient 0%   Wheelchair 150 feet activity     Assist      Assist Level: Dependent - Patient 0%   Blood pressure (!) 146/61, pulse 62,  temperature 97.9 F (36.6 C), temperature source Oral, resp. rate 16, height '5\' 6"'$  (1.676 m), weight 70.3 kg, SpO2 97 %.  Medical Problem List and Plan: 1. 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 Will reduce intensity to 15/7  -spoke with family about anxiety/mental status and that the waxing and waning is not surprising given age, dx, hospital setting, etc. Lab w/u unrevealing. Continue to monitor for further changes.  2.  Antithrombotics: -DVT/anticoagulation:  Pharmaceutical: Heparin             -antiplatelet therapy: none 3. Pain Management: Tylenol as needed. Neurontin 300 mg q HS 4. Mood: team providing ego support             -antipsychotic agents: n/a  -family is providing great emotional support  -would like to avoid benzo given age  -5/26 pt looks much better today 5. Neuropsych: This patient is not capable of making decisions on her own behalf. 6. Skin/Wound Care: Routine skin care checks 7. Fluids/Electrolytes/Nutrition:               --dysphagia 3 diet with thin liquids  -seems to be tolerating thus far  -albumin low, changed to Boost Breeze per pt preference 8. Hypertension: continue diltiazem, hydralazine, nadolol. Home losartan held  -5/26 bp well controlled currently 9: Hyperlipidemia: continue atorvastatin 10: GI prophylaxis: continue pantoprazole 11: pAF: continue diltiazem. No AC until resolution of ICH, outpt follow-up 12: Hypothyroidism: continue Synthroid 13: Urinary retention: continue Flomax             -5/26: remove foley today, voiding trial/timed voids 14: UTI:  Keflex completed  -5/26 repeat UA neg, UCX neg also 15: AKI: BUN and Cr continue downward trend             -encouraging fluids.  16: Leukocytosis: wbc's normal. Recheck 5/29 17: Anemia, chronic: no evidence of acute  blood loss -hgb 8.9--continue to track--recheck 5/29  18: Polymyalgia rheumatica: prednisone 2 mg with lunch 19. Dysphagia: tolerating D3/thin diet             -advance per SLP    LOS: 3 days A FACE TO [place]  [name] 04/05/2022, 9:56 AM

## 2022-04-05 NOTE — Progress Notes (Signed)
Physical Therapy Session Note  Patient Details  Name: [name] MRN: [id number] Date of Birth: 30-Apr-1923  Today's Date: 04/05/2022 PT Individual Time: 1630-1730 PT Individual Time Calculation (min): 60 min   Short Term Goals: Week 1:  PT Short Term Goal 1 (Week 1): pt will roll side to side w/ CGA. PT Short Term Goal 2 (Week 1): Pt will transfer sup/sidelying to sit w/ min A PT Short Term Goal 3 (Week 1): Pt will transfer sit to stand w/ mod A PT Short Term Goal 4 (Week 1): Pt will perform SPT w/ mod A  Skilled Therapeutic Interventions/Progress Updates:     Pt received seated in recliner and agrees to therapy. No complaint of pain. Pt attempts stand pivot with RW to WC. PT provides verbal and tactile cues for hand placement and sequencing of sit to stand. Immediately upon standing pt begins leaning heavily to the R and loses R hand grasp on RW. PT provides maxA to bring pt back to midline but pt is unable to regain substantial stability and requires assistance to guide hips back to recliner for safety. Pt then performs squat pivot from recliner to [facility] with maxA and cues for body mechanics and sequencing. WC transport to gym for time management. Pt performs multiple reps of sit to stand in parallel bars with modA. PT cues for anterior trunk lean and hand placement, as well as hip extension and power up. In standing, pt performs lateal weight shifting progressing to marching in place. Pt has difficulty controlling R lower extremity placement, and gradually demonstrates increasing R lateral lean and lack of sufficient WB or stability through R leg. Mirror provided for visual feedback. Pt performs toe taps on cone and numbered circles to provide visual target as well as task to encourage WB through R hemibody. PT provides modA overall for task. Pt also performs x2' ambulation forward and backward with max cueing for step sequencing. WC transport back to room. Pt left seated in WC with alarm intact  and all need within reach.  Therapy Documentation Precautions:  Precautions Precautions: Fall Precaution Comments: R hemi Restrictions Weight Bearing Restrictions: No    Therapy/Group: Individual Therapy  Breck Coons, PT, DPT 04/05/2022, 8:49 PM

## 2022-04-05 NOTE — Progress Notes (Signed)
Occupational Therapy Session Note  Patient Details  Name: [name] MRN: [id number] Date of Birth: 26-Nov-1922  Today's Date: 04/05/2022 OT Individual Time: 1000-1055 OT Individual Time Calculation (min): 55 min    Short Term Goals: Week 1:  OT Short Term Goal 1 (Week 1): Pt will don a shirt with no more than min cueing for hemi technique and min A OT Short Term Goal 2 (Week 1): Pt will don pants with mod A OT Short Term Goal 3 (Week 1): Pt will maintain sitting balance EOB during ADLs with no more than CGA OT Short Term Goal 4 (Week 1): Pt will complete stand pivot transfer with max of 1  Skilled Therapeutic Interventions/Progress Updates:    Pt received sitting upright in recliner with son present in the room. Pt did not complain of pain and said she slept well. She was agreeable to OT tx and desired to get dressed. Pt on 1.5 of 02 upon entering. SpO2 monitored throughout session on RA, remained greater than 95% throughout all activities. Pt participated in skilled OT intervention for bathing, dressing, and oral care to facilitate an increase in independence with ADLs. Pt transported to the sink for a bird bath. Pt cued to hold deoderant with RUE and pull off lid with LUE to facilitate bimanual task. Able to wash her UB with min A to assist with washing her L underarm and below chest. Cued to use of BUE to assist with care. Pt washed BLE (S). Required Max A to stand at sink and min cueing to place RUE on counter for WB and stability. Pt demonstrated a strong lean to the R side. Pt began washing her bottom with RUE but required assistance from therapist to finish. Therapist cued pt to look in mirror and correct posture. Pt did not demonstrate strong carryover of hemi-technique while dressing UB. Required Mod A to don shirt and Max A to don undergarments and pants. Pt showed a slight increased of ROM in her R shoulder. Pt left with her son in the room in the recliner- chair alarm on, call bell in  reach, and all needs met.  Therapy Documentation Precautions:  Precautions Precautions: Fall Precaution Comments: R hemi Restrictions Weight Bearing Restrictions: No  Therapy/Group: Individual Therapy  Catalina Lunger 04/05/2022,

## 2022-04-05 NOTE — Progress Notes (Signed)
Physical Therapy Session Note  Patient Details  Name: [name] MRN: [id number] Date of Birth: 08/30/1923  Today's Date: 04/05/2022 PT Individual Time: 0900-0930 PT Individual Time Calculation (min): 30 min   Short Term Goals: Week 1:  PT Short Term Goal 1 (Week 1): pt will roll side to side w/ CGA. PT Short Term Goal 2 (Week 1): Pt will transfer sup/sidelying to sit w/ min A PT Short Term Goal 3 (Week 1): Pt will transfer sit to stand w/ mod A PT Short Term Goal 4 (Week 1): Pt will perform SPT w/ mod A  Skilled Therapeutic Interventions/Progress Updates:    pt received in bed and agreeable to therapy. No complaint of pain. Supine>sit with mod A for LE management and VC/TC for trunk elevation. Pt able to sit EOB with close supervision, but noted lean toward R side. Unsure if pushing or weakness. Pt agreeable to getting to recliner. Pt performed mod A stand to stedy with consistent lean to R side. Sit to stand x2 from stedy paddles with min A and from bed/recliner x 2 with mod A and assist for lean. Noted poor eccentric control when sitting to recliner. Pt performed weight shifting for improved lateral lean and equal weight on both feet. Pt remained in recliner at end of session and was left with all needs in reach and alarm active.   Therapy Documentation Precautions:  Precautions Precautions: Fall Precaution Comments: R hemi Restrictions Weight Bearing Restrictions: No General:       Therapy/Group: Individual Therapy  Mickel Fuchs 04/05/2022, 12:55 PM

## 2022-04-05 NOTE — Plan of Care (Signed)
  Problem: Consults Goal: RH STROKE PATIENT EDUCATION Description: See Patient Education module for education specifics  Outcome: Progressing   Problem: RH BOWEL ELIMINATION Goal: RH STG MANAGE BOWEL WITH ASSISTANCE Description: STG Manage Bowel with [place]. Outcome: Not Progressing Note: Patient has currently been using bedpan. Encouraged to transfer to [facility]. Goal: RH STG MANAGE BOWEL W/MEDICATION W/ASSISTANCE Description: STG Manage Bowel with Medication with [place]. Outcome: Progressing   Problem: RH BLADDER ELIMINATION Goal: RH STG MANAGE BLADDER WITH ASSISTANCE Description: STG Manage Bladder With Min Assistance Outcome: Progressing Goal: RH STG MANAGE BLADDER WITH MEDICATION WITH ASSISTANCE Description: STG Manage Bladder With Medication With [place]. Outcome: Progressing   Problem: RH SKIN INTEGRITY Goal: RH STG MAINTAIN SKIN INTEGRITY WITH ASSISTANCE Description: STG Maintain Skin Integrity With [place]. Outcome: Progressing Goal: RH STG ABLE TO PERFORM INCISION/WOUND CARE W/ASSISTANCE Description: STG Able To Perform Incision/Wound Care With [employer]. Outcome: Progressing   Problem: RH SAFETY Goal: RH STG ADHERE TO SAFETY PRECAUTIONS W/ASSISTANCE/DEVICE Description: STG Adhere to Safety Precautions With Cues and Reminders. Outcome: Progressing Goal: RH STG DECREASED RISK OF FALL WITH ASSISTANCE Description: STG Decreased Risk of Fall With [employer]. Outcome: Progressing   Problem: RH COGNITION-NURSING Goal: RH STG USES MEMORY AIDS/STRATEGIES W/ASSIST TO PROBLEM SOLVE Description: STG Uses Memory Aids/Strategies With Min Assistance to Problem Solve. Outcome: Progressing Goal: RH STG ANTICIPATES NEEDS/CALLS FOR ASSIST W/ASSIST/CUES Description: STG Anticipates Needs/Calls for Assist With Cues and Reminders. Outcome: Progressing   Problem: RH KNOWLEDGE DEFICIT Goal: RH STG INCREASE KNOWLEDGE OF HYPERTENSION Description:  Patient and caregivers to demonstrate knowledge of HTN medication management, dietary recommendations, BP parameters with educational materials and handouts provided by staff with min assist at discharge. Outcome: Progressing Goal: RH STG INCREASE KNOWLEDGE OF DYSPHAGIA/FLUID INTAKE Description: Patient and caregivers to demonstrate knowledge of dysphagia diets, safe swallowing techniques with educational materials and handouts provided by staff with min assist at discharge. Outcome: Progressing Goal: RH STG INCREASE KNOWLEGDE OF HYPERLIPIDEMIA Description: Patient and caregiver will demonstrate knowledge of HLD medications, dietary recommendations, lab values with educational materials and handouts provided by staff with min assist at discharge. Outcome: Progressing

## 2022-04-06 DIAGNOSIS — I610 Nontraumatic intracerebral hemorrhage in hemisphere, subcortical: Secondary | ICD-10-CM | POA: Diagnosis not present

## 2022-04-06 LAB — GLUCOSE, CAPILLARY: Glucose-Capillary: 108 mg/dL — ABNORMAL HIGH (ref 70–99)

## 2022-04-06 MED ORDER — PROSIGHT PO TABS
1.0000 | ORAL_TABLET | Freq: Every day | ORAL | Status: DC
  Administered 2022-04-06 – 2022-04-30 (×25): 1 via ORAL
  Filled 2022-04-06 (×25): qty 1

## 2022-04-06 MED ORDER — POLYVINYL ALCOHOL 1.4 % OP SOLN
1.0000 [drp] | OPHTHALMIC | Status: DC | PRN
  Administered 2022-04-22 – 2022-04-24 (×2): 1 [drp] via OPHTHALMIC
  Filled 2022-04-06: qty 15

## 2022-04-06 NOTE — Progress Notes (Signed)
PROGRESS NOTE   Subjective/Complaints: Patient had complaints this morning of of blurriness in right eye.  She reports no pain or decreased visual acuity.  She has a history of chronic macular degeneration.  She takes Preservision at home and has not taken it in the hospital.   ROS: Limited due to cognitive/behavioral    Objective:   No results found.  No results for input(s): WBC, HGB, HCT, PLT in the last 72 hours.  No results for input(s): NA, K, CL, CO2, GLUCOSE, BUN, CREATININE, CALCIUM in the last 72 hours.   Intake/Output Summary (Last 24 hours) at 04/06/2022 1555 Last data filed at 04/06/2022 1003 Gross per 24 hour  Intake 120 ml  Output 550 ml  Net -430 ml        Physical Exam: Vital Signs Blood pressure 113/65, pulse (!) 58, temperature 97.6 F (36.4 C), resp. rate 16, height '5\' 6"'$  (1.676 m), weight 70.3 kg, SpO2 98 %.  Constitutional: No distress . Vital signs reviewed. HEENT: NCAT, EOMI, oral membranes moist. Conjunctival bloodshot and reddened, irritated for right eye.  Visual acuity unchanged, no pain. Neck: supple Cardiovascular: RRR without murmur, without JVD    Respiratory/Chest: CTA Bilaterally without wheezes or rales. Normal effort    GI/Abdomen: BS +, non-tender, non-distended Ext: no clubbing, cyanosis, or edema Psych: pleasant and cooperative  Skin: Clean and intact without signs of breakdown Neuro:  alert, oriented to person. Follows basic commands. Speech clearer, less word finding deficits but language more fluid.  RUE grossly 2+ to 3/5. RLE 2/5. + temp/pain. Tone is normal. Musculoskeletal: no pain with AROM/PROM of limbs, trunk    Assessment/Plan: 1. Functional deficits which require 3+ hours per day of interdisciplinary therapy in a comprehensive inpatient rehab setting. Physiatrist is providing close team supervision and 24 hour management of active medical problems listed  below. Physiatrist and rehab team continue to assess barriers to discharge/monitor patient progress toward functional and medical goals  Care Tool:  Bathing    Body parts bathed by patient: Right arm, Left arm, Chest, Abdomen, Right upper leg, Left upper leg, Face   Body parts bathed by helper: Front perineal area, Buttocks, Right lower leg, Left lower leg     Bathing assist Assist Level: Moderate Assistance - Patient 50 - 74%     Upper Body Dressing/Undressing Upper body dressing   What is the patient wearing?: Pull over shirt    Upper body assist Assist Level: Maximal Assistance - Patient 25 - 49%    Lower Body Dressing/Undressing Lower body dressing      What is the patient wearing?: Pants     Lower body assist Assist for lower body dressing: 2 Helpers     Toileting Toileting    Toileting assist Assist for toileting: 2 Helpers     Transfers Chair/bed transfer  Transfers assist     Chair/bed transfer assist level: Total Assistance - Patient < 25%     Locomotion Ambulation   Ambulation assist   Ambulation activity did not occur: Safety/medical concerns          Walk 10 feet activity   Assist  Walk 10 feet activity did not occur:  Safety/medical concerns        Walk 50 feet activity   Assist Walk 50 feet with 2 turns activity did not occur: Safety/medical concerns         Walk 150 feet activity   Assist Walk 150 feet activity did not occur: Safety/medical concerns         Walk 10 feet on uneven surface  activity   Assist Walk 10 feet on uneven surfaces activity did not occur: Safety/medical concerns         Wheelchair     Assist Is the patient using a wheelchair?: Yes Type of Wheelchair: Manual    Wheelchair assist level: Minimal Assistance - Patient > 75% Max wheelchair distance: 20    Wheelchair 50 feet with 2 turns activity    Assist        Assist Level: Maximal Assistance - Patient 25 - 49% (Per  PT documentation of 20 feet)   Wheelchair 150 feet activity     Assist      Assist Level: Total Assistance - Patient < 25% (Per PT documentation of 20 feet)   Blood pressure 113/65, pulse (!) 58, temperature 97.6 F (36.4 C), resp. rate 16, height '5\' 6"'$  (1.676 m), weight 70.3 kg, SpO2 98 %.  Medical Problem List and Plan: 1. 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 Will reduce intensity to 15/7  -spoke with family about anxiety/mental status and that the waxing and waning is not surprising given age, dx, hospital setting, etc. Lab w/u unrevealing. Continue to monitor for further changes.  2.  Antithrombotics: -DVT/anticoagulation:  Pharmaceutical: Heparin             -antiplatelet therapy: none 3. Pain Management: Tylenol as needed. Neurontin 300 mg q HS 4. Mood: team providing ego support             -antipsychotic agents: n/a  -family is providing great emotional support  -would like to avoid benzo given age  -5/26 pt looks much better today             5/27 Family continues to provide emotional support 5. Neuropsych: This patient is not capable of making decisions on her own behalf. 6. Skin/Wound Care: Routine skin care checks 7. Fluids/Electrolytes/Nutrition:               --dysphagia 3 diet with thin liquids  -seems to be tolerating thus far  -albumin low, changed to Boost Breeze per pt preference 8. Hypertension: continue diltiazem, hydralazine, nadolol. Home losartan held  -5/26 bp well controlled currently 9: Hyperlipidemia: continue atorvastatin 10: GI prophylaxis: continue pantoprazole 11: pAF: continue diltiazem. No AC until resolution of ICH, outpt follow-up 12: Hypothyroidism: continue Synthroid 13: Urinary retention: continue Flomax             -5/26: remove foley today, voiding  trial/timed voids              5/27 Patient is voiding fine today without issues. 14: UTI:  Keflex completed  -5/26 repeat UA neg, UCX neg also 15: AKI: BUN and Cr continue downward trend             -encouraging fluids.  16: Leukocytosis: wbc's normal. Recheck 5/29 17: Anemia, chronic: no  evidence of acute blood loss -hgb 8.9--continue to track--recheck 5/29  18: Polymyalgia rheumatica: prednisone 2 mg with lunch 19. Dysphagia: tolerating D3/thin diet             -advance per SLP 20. Dry eye, blurry vision 5/27 Pt has macular degeneration of the right eye, has not taken her Preservision that she normally takes at home.  She reports blurry vision today but denies pain or change in visual acuity. -Placed order for Prosight (the pharmacy equivalent to Preservision and artifical tears.  Conjunctival bloodshot and reddened for right eye.  May be due to dry air in hospital environment.    LOS: 4 days A FACE TO FACE EVALUATION WAS PERFORMED  Luetta Nutting 04/06/2022, 3:55 PM

## 2022-04-06 NOTE — Plan of Care (Signed)
  Problem: Consults Goal: RH STROKE PATIENT EDUCATION Description: See Patient Education module for education specifics  Outcome: Progressing   Problem: RH BOWEL ELIMINATION Goal: RH STG MANAGE BOWEL WITH ASSISTANCE Description: STG Manage Bowel with [place]. Outcome: Progressing Goal: RH STG MANAGE BOWEL W/MEDICATION W/ASSISTANCE Description: STG Manage Bowel with Medication with [place]. Outcome: Progressing   Problem: RH BLADDER ELIMINATION Goal: RH STG MANAGE BLADDER WITH ASSISTANCE Description: STG Manage Bladder With Min Assistance Outcome: Progressing Goal: RH STG MANAGE BLADDER WITH MEDICATION WITH ASSISTANCE Description: STG Manage Bladder With Medication With [place]. Outcome: Progressing   Problem: RH SKIN INTEGRITY Goal: RH STG MAINTAIN SKIN INTEGRITY WITH ASSISTANCE Description: STG Maintain Skin Integrity With [place]. Outcome: Progressing Goal: RH STG ABLE TO PERFORM INCISION/WOUND CARE W/ASSISTANCE Description: STG Able To Perform Incision/Wound Care With [employer]. Outcome: Progressing   Problem: RH SAFETY Goal: RH STG ADHERE TO SAFETY PRECAUTIONS W/ASSISTANCE/DEVICE Description: STG Adhere to Safety Precautions With Cues and Reminders. Outcome: Progressing Goal: RH STG DECREASED RISK OF FALL WITH ASSISTANCE Description: STG Decreased Risk of Fall With [employer]. Outcome: Progressing   Problem: RH COGNITION-NURSING Goal: RH STG USES MEMORY AIDS/STRATEGIES W/ASSIST TO PROBLEM SOLVE Description: STG Uses Memory Aids/Strategies With Min Assistance to Problem Solve. Outcome: Progressing Goal: RH STG ANTICIPATES NEEDS/CALLS FOR ASSIST W/ASSIST/CUES Description: STG Anticipates Needs/Calls for Assist With Cues and Reminders. Outcome: Progressing   Problem: RH KNOWLEDGE DEFICIT Goal: RH STG INCREASE KNOWLEDGE OF HYPERTENSION Description: Patient and caregivers to demonstrate knowledge of HTN medication management, dietary  recommendations, BP parameters with educational materials and handouts provided by staff with min assist at discharge. Outcome: Progressing Goal: RH STG INCREASE KNOWLEDGE OF DYSPHAGIA/FLUID INTAKE Description: Patient and caregivers to demonstrate knowledge of dysphagia diets, safe swallowing techniques with educational materials and handouts provided by staff with min assist at discharge. Outcome: Progressing Goal: RH STG INCREASE KNOWLEGDE OF HYPERLIPIDEMIA Description: Patient and caregiver will demonstrate knowledge of HLD medications, dietary recommendations, lab values with educational materials and handouts provided by staff with min assist at discharge. Outcome: Progressing

## 2022-04-06 NOTE — Progress Notes (Signed)
Physical Therapy Session Note  Patient Details  Name: [name] MRN: [id number] Date of Birth: 1923-03-23  Today's Date: 04/06/2022 PT Individual Time: 0800-0900 PT Individual Time Calculation (min): 60 min   Short Term Goals: Week 1:  PT Short Term Goal 1 (Week 1): pt will roll side to side w/ CGA. PT Short Term Goal 2 (Week 1): Pt will transfer sup/sidelying to sit w/ min A PT Short Term Goal 3 (Week 1): Pt will transfer sit to stand w/ mod A PT Short Term Goal 4 (Week 1): Pt will perform SPT w/ mod A  Skilled Therapeutic Interventions/Progress Updates:     Pt received semi reclined in bed. Pt reports that her vision is "off" and feels that everything is "wavy". Pt is very anxious that she is losing her vision and initially does not want to participate in therapy. Therapist is able to discuss vision with pt and provides assurance that she is likely not going blind or experiencing another stroke. Pt eventually is agreeable to participate with therapy. No complaint of pain. Supine to sit with modA and cues for sequencing and positioning. Squat pivot transfer to [employer] to the R with modA/maxA and cues for initiation, sequencing, and hand placement. WC transport to gym for time management. Pt's blood pressure taken at 104/48, HR at 55 BPM and O2 at 96% on room air. PT has extended discussion with pt regarding her health status and current presentation, providing calming techniques and education on recovery. Pt then performs mobility training parallel bars. Multiple reps of sit to stand performed with modA and hand over hand support of R upper extremity, as well as facilitation of anterior trunk lean and power up. In standing, mirror utilized for visual feedback. Pt initially performs lateral weight shifting with emphasis on neutral posture and maintaining activation of R lower extremity extensor muscle groups. Pt progresses to ambulation in parallel bars, x2' and x4' forward and backward with  modA/maxA and max multimodal cues for placement of R hand and R foot. Extended seated rest breaks between each bout. WC transport back to room. Pt left seated in WC with alarm intact and all needs within reach.  Therapy Documentation Precautions:  Precautions Precautions: Fall Precaution Comments: R hemi Restrictions Weight Bearing Restrictions: No    Therapy/Group: Individual Therapy  Breck Coons, PT, DPT 04/06/2022, 10:49 AM

## 2022-04-06 NOTE — Progress Notes (Signed)
Physical Therapy Session Note  Patient Details  Name: [name] MRN: [id number] Date of Birth: [birth year]  Today's Date: 04/06/2022 PT Individual Time: 1259-1411 PT Individual Time Calculation (min): 72 min   Short Term Goals: Week 1:  PT Short Term Goal 1 (Week 1): pt will roll side to side w/ CGA. PT Short Term Goal 2 (Week 1): Pt will transfer sup/sidelying to sit w/ min A PT Short Term Goal 3 (Week 1): Pt will transfer sit to stand w/ mod A PT Short Term Goal 4 (Week 1): Pt will perform SPT w/ mod A  Skilled Therapeutic Interventions/Progress Updates:    Chart reviewed and pt agreeable to therapy. Pt received seated in recliner with no c/o pain. Also of note, pt stated she felt very tired at start of session. Session focused on functional transfers and standing balance to promote activity for home mobility. At start if session, PT gathered [facility] for patient. Pt then initiated session with sit to stand in STEDY using [place]. Pt was then taken to therapy gym for time management. In gym, pt completed sit to stand with STEDY and MaxA. Pt stood for 3 mins + 2 mins to play checkers game. Pt needed ModA to balance 2/2 R lean. Pt then c/o high exhaustion. PT and pt returned to room to continue therapy. In room, pt completed sit to stand in STEDY using [place]. From STEDY, pt completed 5x6 marches with [place] for balance. At end of session, pt was left seated in recliner with alarm engaged, nurse call bell and all needs in reach.     Therapy Documentation Precautions:  Precautions Precautions: Fall Precaution Comments: R hemi Restrictions Weight Bearing Restrictions: No    Therapy/Group: Individual Therapy  Marquette Old, PT, DPT 04/06/2022, 2:15 PM

## 2022-04-06 NOTE — Progress Notes (Signed)
Occupational Therapy Session Note  Patient Details  Name: [name] MRN: [id number] Date of Birth: Dec 16, 1922  Today's Date: 04/06/2022 OT Individual Time: 1025-4862 OT Individual Time Calculation (min): 50 min  and Today's Date: 04/06/2022 OT Missed Time: 10 Minutes Missed Time Reason: Nursing care   Short Term Goals: Week 1:  OT Short Term Goal 1 (Week 1): Pt will don a shirt with no more than min cueing for hemi technique and min A OT Short Term Goal 2 (Week 1): Pt will don pants with mod A OT Short Term Goal 3 (Week 1): Pt will maintain sitting balance EOB during ADLs with no more than CGA OT Short Term Goal 4 (Week 1): Pt will complete stand pivot transfer with max of 1  Skilled Therapeutic Interventions/Progress Updates:     Pt received in bed with no pain   ADL: Pt completes ADL at overall MOD-MAX Level. Skilled interventions include: use of stedy for toilet transfers, increased time for pt to manage R extremities onto stedy and attempt to void as NT stating scanned for >430 ml of urine. Unsuccessful void. Transferred back to bed with MIN-MOD A in stedy with poor eccentric control to chair. Pt sit>supine with MIN A to bring RLE back to bed. Pt missed 10 min d/t cathing procedure. Upon return after cathing, pt completes dressing seated at EOB with mod A for UB dressing with mod cuing for hemi strategies and max A for LB dressing at sit to stand level with poor midline orientation. Stand pivot to recliner with MAX A and poor attention to cuing for weight shift L to move RLE.   Pt left at end of session in recliner with exit alarm on, call light in reach and all needs met   Therapy Documentation Precautions:  Precautions Precautions: Fall Precaution Comments: R hemi Restrictions Weight Bearing Restrictions: No General:     Therapy/Group: Individual Therapy  Tonny Branch 04/06/2022, 6:52 AM

## 2022-04-07 ENCOUNTER — Inpatient Hospital Stay (HOSPITAL_COMMUNITY): Payer: Medicare PPO

## 2022-04-07 ENCOUNTER — Encounter (HOSPITAL_COMMUNITY): Payer: Self-pay | Admitting: Physical Medicine & Rehabilitation

## 2022-04-07 DIAGNOSIS — G9341 Metabolic encephalopathy: Secondary | ICD-10-CM

## 2022-04-07 DIAGNOSIS — I4891 Unspecified atrial fibrillation: Secondary | ICD-10-CM

## 2022-04-07 DIAGNOSIS — I610 Nontraumatic intracerebral hemorrhage in hemisphere, subcortical: Secondary | ICD-10-CM | POA: Diagnosis not present

## 2022-04-07 LAB — GLUCOSE, CAPILLARY: Glucose-Capillary: 102 mg/dL — ABNORMAL HIGH (ref 70–99)

## 2022-04-07 MED ORDER — IOHEXOL 350 MG/ML SOLN
100.0000 mL | Freq: Once | INTRAVENOUS | Status: AC | PRN
  Administered 2022-04-07: 100 mL via INTRAVENOUS

## 2022-04-07 NOTE — Progress Notes (Signed)
This provider saw patient today at bedside with daughter present at 30.  Patient was very lethargic requiring several attempts with a sternal rub to awaken.  Upon sternal rub, she immediately closed her eye and would not remain awake.  This is a significant change from yesterday's assessment.  LNW was at 0841 when bedside nurse administered medications.  Per nurse, patient was alert, awake, and responsive to take her medications.  NIHSS documented by bedside nurse was 21. Code stroke was initiated by charge nurse, Santiago Glad.  Rapid Response RN, Saralyn Pilar arrived at bedside with neurology consulted.  Vital signs, EKG, and CBG were taken before patient was taken down for a STAT CT head scan. Awaiting CT results.

## 2022-04-07 NOTE — Progress Notes (Signed)
Physical Therapy Note  Patient Details  Name: [name] MRN: [id number] Date of Birth: 1923/05/04 Today's Date: 04/07/2022    Attempted to see patient for PT session this morning. Patient asleep upon entry, slow to arouse with verbal and tactile stimuli. Patient continuously shaking her head no and stated "I'm tired," several times. Per LPN, patient slept well last night without concerns. Discussed challenges with nursing transfers with [facility] due to patient's poor ability to follow cues and R neglect, also reports concerns of family's understanding of CIR schedule and promoting patient independence. Will reinforce with family as able and relay to lead therapy team. Patient missed 60 min of skilled PT due to fatigue, RN made aware. Will attempt to make-up missed time as able.     Governor Matos L Marielouise Amey PT, DPT  04/07/2022, 8:19 AM

## 2022-04-07 NOTE — Plan of Care (Signed)
  Problem: Consults Goal: RH STROKE PATIENT EDUCATION Description: See Patient Education module for education specifics  Outcome: Progressing   Problem: RH BOWEL ELIMINATION Goal: RH STG MANAGE BOWEL WITH ASSISTANCE Description: STG Manage Bowel with [place]. Outcome: Not Progressing Goal: RH STG MANAGE BOWEL W/MEDICATION W/ASSISTANCE Description: STG Manage Bowel with Medication with [place]. Outcome: Progressing   Problem: RH BLADDER ELIMINATION Goal: RH STG MANAGE BLADDER WITH ASSISTANCE Description: STG Manage Bladder With Min Assistance Outcome: Not Progressing Goal: RH STG MANAGE BLADDER WITH MEDICATION WITH ASSISTANCE Description: STG Manage Bladder With Medication With Min Assistance. Outcome: Not Progressing   Problem: RH SKIN INTEGRITY Goal: RH STG MAINTAIN SKIN INTEGRITY WITH ASSISTANCE Description: STG Maintain Skin Integrity With [place]. Outcome: Progressing Goal: RH STG ABLE TO PERFORM INCISION/WOUND CARE W/ASSISTANCE Description: STG Able To Perform Incision/Wound Care With [employer]. Outcome: Progressing   Problem: RH SAFETY Goal: RH STG ADHERE TO SAFETY PRECAUTIONS W/ASSISTANCE/DEVICE Description: STG Adhere to Safety Precautions With Cues and Reminders. Outcome: Progressing Goal: RH STG DECREASED RISK OF FALL WITH ASSISTANCE Description: STG Decreased Risk of Fall With [employer]. Outcome: Progressing   Problem: RH COGNITION-NURSING Goal: RH STG USES MEMORY AIDS/STRATEGIES W/ASSIST TO PROBLEM SOLVE Description: STG Uses Memory Aids/Strategies With Min Assistance to Problem Solve. Outcome: Progressing Goal: RH STG ANTICIPATES NEEDS/CALLS FOR ASSIST W/ASSIST/CUES Description: STG Anticipates Needs/Calls for Assist With Cues and Reminders. Outcome: Not Progressing   Problem: RH KNOWLEDGE DEFICIT Goal: RH STG INCREASE KNOWLEDGE OF HYPERTENSION Description: Patient and caregivers to demonstrate knowledge of HTN medication  management, dietary recommendations, BP parameters with educational materials and handouts provided by staff with min assist at discharge. Outcome: Progressing Goal: RH STG INCREASE KNOWLEDGE OF DYSPHAGIA/FLUID INTAKE Description: Patient and caregivers to demonstrate knowledge of dysphagia diets, safe swallowing techniques with educational materials and handouts provided by staff with min assist at discharge. Outcome: Progressing Goal: RH STG INCREASE KNOWLEGDE OF HYPERLIPIDEMIA Description: Patient and caregiver will demonstrate knowledge of HLD medications, dietary recommendations, lab values with educational materials and handouts provided by staff with min assist at discharge. Outcome: Progressing

## 2022-04-07 NOTE — Progress Notes (Signed)
NP and nurse presented to bedside related to patient being unable to arouse. Patient able to respond when name is called but presented very lethargic and would close eyes. Process repeated with same effect. NIH stroke scale completed and code stroke called. VS with blood glucose and EKG done at beside with daughter present. Charge nurse informed. Patient transferred in bed to CT with Rapid Response nurse and Charge nurse. CT performed and patient returned to room. No bleed present. Patient returned alert. Informed no bleed present. Code stroke cancelled. Patient in bed alert with family present at bedside. Sanda Linger, LPN

## 2022-04-07 NOTE — Progress Notes (Signed)
Occupational Therapy Session Note  Patient Details  Name: ENGLAND GREB MRN: [id number] Date of Birth: November 08, 1923  Today's Date: 04/07/2022 OT Missed Time: 55 Minutes Missed Time Reason: Nursing care;Other (comment) (PA in room assessing pt stating "change in neurological status)  Pt received upright in bed with PA, nursing and family in room. Per PA, pt with change in neurological status, with stat testing being completed and requested therapist revisit at later time to assess pt status and ability to participate in therapies. Will revisit pt as time allows.  Carnelia Oscar E Toshiyuki Fredell 04/07/2022, 7:39 AM

## 2022-04-07 NOTE — Code Documentation (Signed)
Code stroke was activated on this patient at 1018 due to change in LOC and mentation. This patient originally was brought in with [place]. She has a history of DVT, HTN, HLD, PVD, and atrial fib and is on daily eliquis. Her DOAC has been held due to her [place], but had received SQ heparin at 0531 today.   Patient's symptoms appear to be global. NIH 4. Mild dysarthria (1), right leg drift (1), LOC (1), mild palsy (1) Head CT negative for rebleed. CBG 102. Patient received CT and CTA. After CT scan the patient perked up and was closer to her baseline. No acute treatment indicated for this patient. Per MD, we are cancelling code stroke with this patient.   Enos Fling Rapid Response RN

## 2022-04-07 NOTE — Progress Notes (Signed)
PROGRESS NOTE   Subjective/Complaints:  Patient was seen at bedside today with daughter present at 40.  She was very lethargic requiring several attempts with a sternal rub to awaken.  Upon sternal rub, she immediately closed her eye and would not remain awake.  This is a significant change from yesterday's assessment.  LNW was at 0841 when bedside nurse administered medications.  Per nurse, patient was alert, awake, and responsive to take her medications.  NIHSS documented by bedside nurse was 21 during today's bedside assessment. Prior NIHSS was 7 on 04/02/22. Code stroke was activated with STAT CT head and Dr. Lynnae Sandhoff, neurology consulted.  ROS: Limited due to cognitive/behavioral    Objective:   No results found.  No results for input(s): WBC, HGB, HCT, PLT in the last 72 hours.  No results for input(s): NA, K, CL, CO2, GLUCOSE, BUN, CREATININE, CALCIUM in the last 72 hours.   Intake/Output Summary (Last 24 hours) at 04/07/2022 1038 Last data filed at 04/07/2022 0555 Gross per 24 hour  Intake 354 ml  Output 1450 ml  Net -1096 ml        Physical Exam: Vital Signs Blood pressure (!) 102/57, pulse (!) 56, temperature 98.4 F (36.9 C), resp. rate 16, height '5\' 6"'$  (1.676 m), weight 70.3 kg, SpO2 98 %.  Exam at 1005 today:  Constitutional: Very difficult to arouse, requiring repeated sternal rub and would immediate close eyes again, requiring sternal rub to reawaken. Code stoke activated. HEENT: NCAT, EOMI, oral membranes moist.  Eyes: Pupils 2, equal and round, but response sluggish.  Conjunctiva was reddened yesterday, with complaint of eye irritation.  Today conjunctiva clear. Neck: supple Cardiovascular: RRR without murmur, without JVD    Respiratory/Chest: Normal effort. CTA Bilaterally without wheezes or rales.  GI/Abdomen: BS +, non-tender, non-distended Ext: no clubbing, cyanosis, or edema Psych: Unable  to assess due to acute neurological change.  Skin: Clean and intact without signs of breakdown Neuro:  Acute neurological change.  Assisted bedside nurse in performing NIHSS at 1005 with a score or 21.  Prior NIHSS 7, 04/02/22.  Requiring sternal rub to briefly arouse.  After several sternal rub attempts, pt was awake enough to be able to say name, but unable to follow commands, such as being asked to smile or grip hand.  Unable to assess motor or sensory function. Musculoskeletal: Unable to assess due to acute neurological change, unable to follow commands.  Pt was re-assessed at 1140 with the following PE: Note: that she is much more alert and her exam now is comparable to her exam yesterday morning.  Constitutional: No distress. Vital signs reviewed. HEENT: NCAT, EOMI, oral membranes moist.  Eyes: Conjunctiva mostly clear,very mild redness.  Pupils now more reactive to light, 2 mm, round, equal. Neck: supple Cardiovascular: RRR without murmur, without JVD    Respiratory/Chest: CTA Bilaterally without wheezes or rales. Normal effort    GI/Abdomen: BS +, non-tender, non-distended Ext: no clubbing, cyanosis, or edema Psych: pleasant and cooperative  Skin: Clean and intact without signs of breakdown Neuro:  Now able to answer orientation questions.  Alert and oriented to person, knows she is in hospital, could not quite  answer the year. Aware that a group of people were in the room earlier, but did not know what was going on. Can now follow basic commands, smile, squeeze hand.  CN II-XII grossly intact. Speech at yesterday's baseline, word finding deficits have improved.  RUE grossly 2+ to 3/5. RLE 2/5. + temp/pain. Tone is normal. Musculoskeletal: Able to perform AROM and PROM of limbs and trunk without pain.    Assessment/Plan: 1. Functional deficits which require 3+ hours per day of interdisciplinary therapy in a comprehensive inpatient rehab setting. Physiatrist is providing close team  supervision and 24 hour management of active medical problems listed below. Physiatrist and rehab team continue to assess barriers to discharge/monitor patient progress toward functional and medical goals  Care Tool:  Bathing    Body parts bathed by patient: Right arm, Left arm, Chest, Abdomen, Right upper leg, Left upper leg, Face   Body parts bathed by helper: Front perineal area, Buttocks, Right lower leg, Left lower leg     Bathing assist Assist Level: Moderate Assistance - Patient 50 - 74%     Upper Body Dressing/Undressing Upper body dressing   What is the patient wearing?: Pull over shirt    Upper body assist Assist Level: Maximal Assistance - Patient 25 - 49%    Lower Body Dressing/Undressing Lower body dressing      What is the patient wearing?: Pants     Lower body assist Assist for lower body dressing: 2 Helpers     Toileting Toileting    Toileting assist Assist for toileting: 2 Helpers     Transfers Chair/bed transfer  Transfers assist     Chair/bed transfer assist level: Total Assistance - Patient < 25%     Locomotion Ambulation   Ambulation assist   Ambulation activity did not occur: Safety/medical concerns          Walk 10 feet activity   Assist  Walk 10 feet activity did not occur: Safety/medical concerns        Walk 50 feet activity   Assist Walk 50 feet with 2 turns activity did not occur: Safety/medical concerns         Walk 150 feet activity   Assist Walk 150 feet activity did not occur: Safety/medical concerns         Walk 10 feet on uneven surface  activity   Assist Walk 10 feet on uneven surfaces activity did not occur: Safety/medical concerns         Wheelchair     Assist Is the patient using a wheelchair?: Yes Type of Wheelchair: Manual    Wheelchair assist level: Minimal Assistance - Patient > 75% Max wheelchair distance: 20    Wheelchair 50 feet with 2 turns  activity    Assist        Assist Level: Maximal Assistance - Patient 25 - 49% (Per PT documentation of 20 feet)   Wheelchair 150 feet activity     Assist      Assist Level: Total Assistance - Patient < 25% (Per PT documentation of 20 feet)   Blood pressure (!) 102/57, pulse (!) 56, temperature 98.4 F (36.9 C), resp. rate 16, height '5\' 6"'$  (1.676 m), weight 70.3 kg, SpO2 98 %.  Medical Problem List and Plan: 1. Functional deficits secondary to left thalamic intracranial hemorrhage             -patient may shower             -ELOS/Goals: 14-16 days,  supervision to min assist  -Continue CIR therapies including PT, OT, and SLP. Will reduce intensity to 15/7  -spoke with family about anxiety/mental status and that the waxing and waning is not surprising given age, dx, hospital setting, etc. Lab w/u unrevealing. Continue to monitor for further changes.   -5/28 Due to change in LOC this morning and being very difficult to arouse, by sternal rub only.  Code stroke was called.  CT/CTA head negative for re-bleed.  Per note from Rapid Response RN, pt appears to have become more responsive after the scan.  Evaluated by Dr. Theda Sers, neurology.  5/28 CT Head impression: Expected evolution of the subacute left thalamic hematoma. Intraventricular clot is diminished and there is no hydrocephalus.  Brain atrophy and chronic small vessel ischemia.  5/28 CTA negative for large vessel occlusion: No emergent finding. Atherosclerosis without no large vessel occlusion or flow limiting stenosis of major vessels.  No evidence of vascular lesion underlying the patient's subacute left thalamic hematoma.  5/28 Spoke with Dr. Theda Sers, who confirmed no re-bleed and actual improvement from prior CT scan.  Per Dr. Theda Sers, suggested that 100 mg modafinil (Provigil) may be considered to improve daytime alertness. Per Dr. Theda Sers, work up for confusion/encephalopathy with ABG orders placed.  Unfortunately, there  was not enough blood sample for lab to run ABG.  Per Dr. Dagoberto Ligas, hold off on ABG for now, since her LOC and alertness have improved.  2.  Antithrombotics: -DVT/anticoagulation:  Pharmaceutical: Heparin             -antiplatelet therapy: none 3. Pain Management: Tylenol as needed. Neurontin 300 mg q HS 4. Mood: team providing ego support             -antipsychotic agents: n/a  -family is providing great emotional support  -would like to avoid benzo given age  -5/26 pt looks much better today             5/27 Family continues to provide emotional support             5/28 Daughter at bedside during assessment today. 5. Neuropsych: This patient is not capable of making decisions on her own behalf. 6. Skin/Wound Care: Routine skin care checks 7. Fluids/Electrolytes/Nutrition:               --dysphagia 3 diet with thin liquids  -seems to be tolerating thus far  -albumin low, changed to Boost Breeze per pt preference 8. Hypertension: continue diltiazem, hydralazine, nadolol. Home losartan held  -5/26 bp well controlled currently 9: Hyperlipidemia: continue atorvastatin 10: GI prophylaxis: continue pantoprazole 11: pAF: continue diltiazem. No AC until resolution of ICH, outpt follow-up 12: Hypothyroidism: continue Synthroid 13: Urinary retention: continue Flomax             -5/26: remove foley today, voiding trial/timed voids              5/27 Patient is voiding fine today without issues.              5/28 No changes, still voiding fine. 14: UTI:  Keflex completed  -5/26 repeat UA neg, UCX neg also 15: AKI: BUN and Cr continue downward trend             -encouraging fluids.  16: Leukocytosis: wbc's normal. Recheck 5/29 17: Anemia, chronic: no evidence of acute blood loss -hgb 8.9--continue to track--recheck 5/29  18: Polymyalgia rheumatica: prednisone 2 mg with lunch 19. Dysphagia: tolerating D3/thin diet             -  advance per SLP 20. Dry eye, blurry vision 5/27 Pt has macular  degeneration of the right eye, has not taken her Preservision that she normally takes at home.  She reports blurry vision today but denies pain or change in visual acuity. -Placed order for Prosight (the pharmacy equivalent to Preservision and artifical tears.  Conjunctival bloodshot and reddened for right eye.  May be due to dry air in hospital environment. 5/28 Conjunctiva appear more clear today.  Continue use of artifical tears and Prosight.    LOS: 5 days A FACE TO FACE EVALUATION WAS PERFORMED  Luetta Nutting 04/07/2022, 10:38 AM

## 2022-04-07 NOTE — Consult Note (Signed)
Neurology Consult H&P  TYNASIA MCCAUL MR# [id number] 04/07/2022   CC: acute encephalopathy  History is obtained from: Nursing staff, patient and chart.  HPI: [name] is a 88 y.o. female PMHx as reviewed below admitted with ICH 2 left basal ganglia transferred to rehab noted this morning to have sudden change in mentation. Nursing staff described NIHSS 7 (baseline) ---->21.  Code stroke was activated on this patient at  due to change in LOC and mentation. This patient originally was brought in with [place]. She has a history of DVT, HTN, HLD, PVD, and atrial fib and is on eliquis. Her DOAC has been held due to her [place] but had received SQ heparin at 0531 today.    Patient's symptoms appear to be global. Head CT negative for rebleed. CBG 102. Patient received CT and CTA. After CT scan the patient perked up and was closer to her baseline.   LKW: 9833 tNK given: No not indicated IR Thrombectomy No, not  Modified Rankin Scale: 0-Completely asymptomatic and back to baseline post- stroke NIHSS: 4 - Mild dysarthria (1), right leg drift (1), LOC (1), mild palsy (1)  ROS: A complete ROS was performed and is negative except as noted in the HPI.   Past Medical History:  Diagnosis Date   Closed fracture of left distal radius 12/29/2019   DVT (deep venous thrombosis) ([place]) 09/2015   RLE   Dysrhythmia    Afib   GERD (gastroesophageal reflux disease)    Hip fracture ([place]) 12/02/2020   Hypertension    Hypothyroidism    Melanoma ([place]) 06/16/2017   Facial melanoma, removed by Dr. Harvel Quale   Peripheral vascular disease [facility])    peripheral neuropathy     Family History  Problem Relation Age of Onset   Cancer Mother        BREAST   COPD Father    Emphysema Father    Cancer Daughter        breast    Social History:  reports that she has never smoked. She has never used smokeless tobacco. She reports that she does not drink alcohol and does not use drugs.   Prior to Admission  medications   Medication Sig Start Date End Date Taking? Authorizing Provider  apixaban (ELIQUIS) 5 MG TABS tablet Take 5 mg by mouth 2 (two) times daily.   Yes [provider]  losartan (COZAAR) 100 MG tablet Take 100 mg by mouth daily.   Yes [provider]  acetaminophen (TYLENOL) 500 MG tablet Take 500 mg by mouth at bedtime.    [provider]  ALPRAZolam Duanne Moron) 0.25 MG tablet Take 1 tablet (0.25 mg total) by mouth at bedtime as needed for anxiety. 04/02/22   Shelly Coss, MD  atorvastatin (LIPITOR) 20 MG tablet Take 1 tablet (20 mg total) by mouth daily. 04/03/22   Shelly Coss, MD  diltiazem (CARDIZEM CD) 120 MG 24 hr capsule Take 1 capsule (120 mg total) by mouth every evening. 04/03/22   [name], MD  docusate sodium (COLACE) 100 MG capsule Take 1 capsule (100 mg total) by mouth 2 (two) times daily. 01/20/20   Love, Ivan Anchors, PA-C  gabapentin (NEURONTIN) 300 MG capsule TAKE 1 CAPSULE BY MOUTH EVERYDAY AT BEDTIME Patient taking differently: Take 300 mg by mouth at bedtime. 11/26/21   [name], MD  hydrALAZINE (APRESOLINE) 50 MG tablet Take 1 tablet (50 mg total) by mouth every 8 (eight) hours. 04/02/22   Shelly Coss,  MD  levothyroxine (SYNTHROID) 50 MCG tablet TAKE 1 TABLET BY MOUTH EVERY DAY Patient taking differently: Take 50 mcg by mouth daily before breakfast. 11/26/21   [name], MD  Multiple Vitamins-Minerals (PRESERVISION AREDS 2 PO) Take 1 tablet by mouth daily with lunch.    [provider]  nadolol (CORGARD) 80 MG tablet Take 1 tablet (80 mg total) by mouth daily. 07/26/21   [name], MD  pantoprazole (PROTONIX) 40 MG tablet TAKE 1 TABLET BY MOUTH EVERY DAY Patient taking differently: Take 40 mg by mouth daily. 10/09/21   [name], MD  predniSONE (DELTASONE) 1 MG tablet Take 2 tablets (2 mg total) by mouth daily with lunch. 04/03/22   Shelly Coss, MD  senna-docusate (SENOKOT-S) 8.6-50  MG tablet Take 1 tablet by mouth 2 (two) times daily. 04/02/22   Shelly Coss, MD  tamsulosin (FLOMAX) 0.4 MG CAPS capsule Take 1 capsule (0.4 mg total) by mouth daily. 04/03/22   Shelly Coss, MD  Vitamin D, Ergocalciferol, (DRISDOL) 1.25 MG (50000 UNIT) CAPS capsule Take 1 capsule (50,000 Units total) by mouth every 7 (seven) days. Patient taking differently: Take 50,000 Units by mouth every 7 (seven) days. No set day 10/25/21   [name], MD    Exam: Current vital signs: BP (!) 102/57 (BP Location: Right Arm)   Pulse (!) 56   Temp 98.4 F (36.9 C)   Resp 16   Ht '5\' 6"'$  (1.676 m)   Wt 70.3 kg   SpO2 98%   BMI 25.01 kg/m   Physical Exam  Constitutional: Appears well-developed and well-nourished.  Psych: Affect appropriate to situation Eyes: No scleral injection HENT: No OP obstruction. Head: Normocephalic.  Cardiovascular: Normal rate and regular rhythm.  Respiratory: Effort normal, symmetric excursions bilaterally, no audible wheezing. GI: Soft.  No distension. There is no tenderness.  Skin: WDI  Neuro: Mental Status: Patient is awake, alert, oriented to person. Patient is able to give a clear and coherent history. Speech bradycardia [place] however fluent, intact comprehension and repetition. No signs of aphasia or neglect. Visual Fields are full. Pupils are equal, round, and reactive to light. EOMI without ptosis or diplopia.  Facial sensation is symmetric to temperature Facial movement is mildly weak in the lower right.  Hearing is intact to voice. Uvula midline and palate elevates symmetrically. Shoulder shrug is symmetric. Tongue is midline without atrophy or fasciculations.  Tone is normal. Bulk is normal.  Right lower extremity 4 out of 5 Sensation is symmetric to light touch and temperature in the arms and legs. Deep Tendon Reflexes: 2+ and symmetric in the biceps and patellae. Toes are downgoing bilaterally. FNF and HKS are intact  bilaterally. Gait - Deferred  I have reviewed labs in epic and the pertinent results are: CBG 147  I have reviewed the images obtained: NCT head showed Expected evolution of the subacute left thalamic hematoma. Intraventricular clot is diminished and there is no hydrocephalus. CTA head and neck showed No emergent finding. Atherosclerosis without no large vessel occlusion or flow limiting stenosis of major vessels. No evidence of vascular lesion underlying the patient's subacute left thalamic hematoma.  Assessment: SARIAH HENKIN is a 88 y.o. female PMHx recent ICH 2 evolving left BG hemorrhage with acute encephalopathy.  During evaluation she interacted appropriately and answers questions consistently and only had very minimal neurologic deficit (mild right face right leg weakness).  Symptoms not entirely suggestive of stroke, moreover non-debilitating and therefore the patient did not  receive TNK.   Impression:  Mild right face and lower extremity weakness Probable stroke versus encephalopathy-patient is on 3 L [place] and she stated that she did not wear it most of the morning.  Patient just had complete stroke work-up and may only need further imaging to reevaluate underlying secondary factors uncontrolled and may likely be restarting Eliquis soon for A-fib.  Plan: -May consider MRI brain without contrast. - Recommend Statin for goal LDL <70. - Goal A1c <7. - SBP goal <140. - Telemetry monitoring for arrhythmia. -Continue PT/OT/SLP consult.   This patient is critically ill and at significant risk of neurological worsening, death and care requires constant monitoring of vital signs, hemodynamics,respiratory and cardiac monitoring, neurological assessment, discussion with family, other specialists and medical decision making of high complexity. I spent 75 minutes of neurocritical care time  in the care of  this patient. This was time spent independent of any time provided by nurse  practitioner or PA.  Electronically signed by:  Lynnae Sandhoff, MD Page: [phone] 04/07/2022, 10:33 AM  If 7pm- 7am, please page neurology on call as listed in [place].

## 2022-04-08 DIAGNOSIS — I610 Nontraumatic intracerebral hemorrhage in hemisphere, subcortical: Secondary | ICD-10-CM | POA: Diagnosis not present

## 2022-04-08 DIAGNOSIS — I10 Essential (primary) hypertension: Secondary | ICD-10-CM | POA: Diagnosis not present

## 2022-04-08 DIAGNOSIS — D638 Anemia in other chronic diseases classified elsewhere: Secondary | ICD-10-CM | POA: Diagnosis not present

## 2022-04-08 DIAGNOSIS — I69391 Dysphagia following cerebral infarction: Secondary | ICD-10-CM | POA: Diagnosis not present

## 2022-04-08 LAB — CBC
HCT: 27.3 % — ABNORMAL LOW (ref 36.0–46.0)
Hemoglobin: 8.9 g/dL — ABNORMAL LOW (ref 12.0–15.0)
MCH: 29.8 pg (ref 26.0–34.0)
MCHC: 32.6 g/dL (ref 30.0–36.0)
MCV: 91.3 fL (ref 80.0–100.0)
Platelets: 303 10*3/uL (ref 150–400)
RBC: 2.99 MIL/uL — ABNORMAL LOW (ref 3.87–5.11)
RDW: 14.9 % (ref 11.5–15.5)
WBC: 8.3 10*3/uL (ref 4.0–10.5)
nRBC: 0 % (ref 0.0–0.2)

## 2022-04-08 LAB — BASIC METABOLIC PANEL
Anion gap: 6 (ref 5–15)
BUN: 13 mg/dL (ref 8–23)
CO2: 24 mmol/L (ref 22–32)
Calcium: 9 mg/dL (ref 8.9–10.3)
Chloride: 108 mmol/L (ref 98–111)
Creatinine, Ser: 0.97 mg/dL (ref 0.44–1.00)
GFR, Estimated: 53 mL/min — ABNORMAL LOW (ref >60)
Glucose, Bld: 97 mg/dL (ref 70–99)
Potassium: 3.7 mmol/L (ref 3.5–5.1)
Sodium: 138 mmol/L (ref 135–145)

## 2022-04-08 MED ORDER — GABAPENTIN 100 MG PO CAPS
200.0000 mg | ORAL_CAPSULE | Freq: Every day | ORAL | Status: DC
  Administered 2022-04-08 – 2022-04-18 (×11): 200 mg via ORAL
  Filled 2022-04-08 (×11): qty 2

## 2022-04-08 MED ORDER — MELATONIN 3 MG PO TABS
3.0000 mg | ORAL_TABLET | Freq: Every evening | ORAL | Status: DC | PRN
  Administered 2022-04-08 – 2022-04-12 (×2): 3 mg via ORAL
  Filled 2022-04-08 (×2): qty 1

## 2022-04-08 NOTE — Plan of Care (Signed)
  Problem: Consults Goal: RH STROKE PATIENT EDUCATION Description: See Patient Education module for education specifics  Outcome: Progressing   Problem: RH BOWEL ELIMINATION Goal: RH STG MANAGE BOWEL WITH ASSISTANCE Description: STG Manage Bowel with [place]. Outcome: Not Progressing Note: Patient encouraged to use BSC or transferred to toilet via stedy. Patient participates but is sometimes encouraged to rest. Goal: RH STG MANAGE BOWEL W/MEDICATION W/ASSISTANCE Description: STG Manage Bowel with Medication with [place]. Outcome: Progressing   Problem: RH BLADDER ELIMINATION Goal: RH STG MANAGE BLADDER WITH ASSISTANCE Description: STG Manage Bladder With Min Assistance Outcome: Progressing Goal: RH STG MANAGE BLADDER WITH MEDICATION WITH ASSISTANCE Description: STG Manage Bladder With Medication With [place]. Outcome: Progressing   Problem: RH SKIN INTEGRITY Goal: RH STG MAINTAIN SKIN INTEGRITY WITH ASSISTANCE Description: STG Maintain Skin Integrity With [place]. Outcome: Progressing Goal: RH STG ABLE TO PERFORM INCISION/WOUND CARE W/ASSISTANCE Description: STG Able To Perform Incision/Wound Care With [employer]. Outcome: Progressing   Problem: RH SAFETY Goal: RH STG ADHERE TO SAFETY PRECAUTIONS W/ASSISTANCE/DEVICE Description: STG Adhere to Safety Precautions With Cues and Reminders. Outcome: Progressing Goal: RH STG DECREASED RISK OF FALL WITH ASSISTANCE Description: STG Decreased Risk of Fall With [employer]. Outcome: Progressing   Problem: RH COGNITION-NURSING Goal: RH STG USES MEMORY AIDS/STRATEGIES W/ASSIST TO PROBLEM SOLVE Description: STG Uses Memory Aids/Strategies With Min Assistance to Problem Solve. Outcome: Progressing Goal: RH STG ANTICIPATES NEEDS/CALLS FOR ASSIST W/ASSIST/CUES Description: STG Anticipates Needs/Calls for Assist With Cues and Reminders. Outcome: Progressing   Problem: RH KNOWLEDGE DEFICIT Goal: RH STG  INCREASE KNOWLEDGE OF HYPERTENSION Description: Patient and caregivers to demonstrate knowledge of HTN medication management, dietary recommendations, BP parameters with educational materials and handouts provided by staff with min assist at discharge. Outcome: Progressing Goal: RH STG INCREASE KNOWLEDGE OF DYSPHAGIA/FLUID INTAKE Description: Patient and caregivers to demonstrate knowledge of dysphagia diets, safe swallowing techniques with educational materials and handouts provided by staff with min assist at discharge. Outcome: Progressing Goal: RH STG INCREASE KNOWLEGDE OF HYPERLIPIDEMIA Description: Patient and caregiver will demonstrate knowledge of HLD medications, dietary recommendations, lab values with educational materials and handouts provided by staff with min assist at discharge. Outcome: Progressing

## 2022-04-08 NOTE — Progress Notes (Signed)
Recreational Therapy Session Note  Patient Details  Name: [name] MRN: [id number] Date of Birth: 05-10-1923 Today's Date: 04/08/2022   Pt participated in animal assisted activity seated w/c level with supervision.  Pt  encouraged to use RUE to pet the dog.  Pt easily engaged in conversation with pet partner team and appreciative of this visit.  Per policy, hand hygiene performed prior to and after pet interaction.   [place] 04/08/2022, 1:59 PM

## 2022-04-08 NOTE — Progress Notes (Signed)
Occupational Therapy Session Note  Patient Details  Name: [name] MRN: [id number] Date of Birth: June 01, 1923  Today's Date: 04/08/2022 OT Individual Time: 8676-7209 OT Individual Time Calculation (min): 58 min    Short Term Goals: Week 1:  OT Short Term Goal 1 (Week 1): Pt will don a shirt with no more than min cueing for hemi technique and min A OT Short Term Goal 2 (Week 1): Pt will don pants with mod A OT Short Term Goal 3 (Week 1): Pt will maintain sitting balance EOB during ADLs with no more than CGA OT Short Term Goal 4 (Week 1): Pt will complete stand pivot transfer with max of 1  Skilled Therapeutic Interventions/Progress Updates:    Pt greeted handoff from PT and agreeable to OT treatment session. Pt much brighter today and ready to participate in therapy. Pt reported need to wash up and get dressed. Pt completed bed mobility with moderate cues for initiation and mod A overall. Pt with apraxic R side and difficulty motor planning. With cues for attention and initiation, she was able to bring R hip and arm forward. Pt needed mod A to achieve sitting balance, progressing to CGA. Squat-pivot from bed to wc on L side with mod A and OT assist to manage R side. Pt brought to the sink for BADL tasks. Focus on forced use of R UE within all BADL tasks for neuro re-ed. With cues, pt able to grasp toothbrush with R hand while applying toothpaste with L. She was unable to get toothbrush to mouth without min A from OT. Pt then tranisitioned to L hand for thoroughness. Pt declined to wash lower half and stated her tech had washed her bottom. OT educated on dressing techniques using figure 4 position. Pt needed min A to keep R LE in figure 4 and thread pant legs. Mod A overall. SiT<.stand with R knee block and mod/max A with facilitation for full hip extension and upright trunk. OT assist to pull pants over hips. Stedy used to transfer to recliner with mod A to get to standing. OT issued foam block  and educated on 10 squeezes every hour while watching hand. Pt left seated in recliner with R UE propped up, alarm belt on, call bell in lap, son present, and needs met.   Therapy Documentation Precautions:  Precautions Precautions: Fall Precaution Comments: R hemi Restrictions Weight Bearing Restrictions: No Pain: Pain Assessment Pain Scale: 0-10 Pain Score: 0-No pain   Therapy/Group: Individual Therapy  Valma Cava 04/08/2022, 9:37 AM

## 2022-04-08 NOTE — Progress Notes (Signed)
Speech Language Pathology Daily Session Note  Patient Details  Name: [name] MRN: [id number] Date of Birth: 1923-07-05  Today's Date: 04/08/2022 SLP Individual Time: 1300-1400 SLP Individual Time Calculation (min): 60 min  Short Term Goals: Week 1: SLP Short Term Goal 1 (Week 1): Pt will demonstrate recall of novel, daily information with min A verbal cues for use of external aids. SLP Short Term Goal 2 (Week 1): Pt will demonstrate basic problem solving skills with supervision A verbal cues. SLP Short Term Goal 3 (Week 1): Pt will self-monitor and self-correct verbal and functional errors with supervision A verbal cues. SLP Short Term Goal 4 (Week 1): Pt will demonstrate word finding skills in higher level task (ex: divergent/convergent naming and abstract topics) with min A semantic cues. SLP Short Term Goal 5 (Week 1): Pt will demonstrate 85% speech intelligibility sentence level with supervision A verbal cues for over articulation and rate. SLP Short Term Goal 6 (Week 1): Pt will consume regular textures and thin liquids with no overt s/s aspiration and mod I use of swallow strategies.  Skilled Therapeutic Interventions: Skilled ST treatment focused on cognitive-communication goals. Pt received sleeping in recliner; agreeable to transfer to [facility] with max A to stand and transferred to therapy room in w/c. SLP facilitated sessoin by providing supervision A verbal cues for abstract reasoning task by generating a person's name per each letter of the alphabet. Occ cues required for sustained attention. SLP facilitated short-term recall by educating on internal/external memory strategies. Pt utilized internal strategies (repetition, association) to recall list of 10 grocery items with 90% accuracy with sup A verbal cues following 5 minute delay, and 80% accuracy w/ sup A (semantic) cues following 15 minute delay. Pt recalled 1 of 2 discussed memory strategies at end of session. Patient was left  in w/c with alarm activated and immediate needs within reach at end of session. Continue per current plan of care.      Pain  None/denied  Therapy/Group: Individual Therapy  Babbette Dalesandro T Tequan Redmon 04/08/2022, 1:21 PM

## 2022-04-08 NOTE — Progress Notes (Addendum)
Physical Therapy Session Note  Patient Details  Name: [name] MRN: [id number] Date of Birth: [birth year]  Today's Date: 04/08/2022 PT Individual Time: 1415-1555 (25 min make up) PT Individual Time Calculation (min): 100 min   Short Term Goals: Week 1:  PT Short Term Goal 1 (Week 1): pt will roll side to side w/ CGA. PT Short Term Goal 2 (Week 1): Pt will transfer sup/sidelying to sit w/ min A PT Short Term Goal 3 (Week 1): Pt will transfer sit to stand w/ mod A PT Short Term Goal 4 (Week 1): Pt will perform SPT w/ mod A  Skilled Therapeutic Interventions/Progress Updates: pt seated in wc.  She denied pain at rest, but she c/o L shoulder pain with movement.  Son Gershon Mussel was in room, and he explained that pt fx'd shoulder in 1980s, and she has had weakness and reduced ROM since then. Ice provided by PT at end of session by PT.  Slide board transfer  wc> mat table to L, level with mod assist to initiate.  Pt's RLE rigid during transfer in hip and knee flexion and she needed assistance to move R foot throughout transfer. Slide board transfer to R to return to wc, with min assist to initiate, and placement of R hand.  Therapeutic activities for dynamic sitting balance: seated with bil foot support, reaching within BOS with R/L hands; trunk flexion/extension while pushing rolling stool in front of her with bil hands.  Sit> stand pulling up on back of sturdy chair , min assist.  With bil UE support, wt shifting in standing L><R with guarding for R knee; no buckling noted.   neuromuscular re-education via forced use, wt bearing, demo and multimodal cues for : R/L lateral leans in sitting; loading RLE in standing by lifting L foot off of floor.   gait training on level tile x 4', +2, with assistance for wt shifting, R LE stance control, and R foot placement.  Motor apraxia noted with placement of R foot.  In parallel bars, pre-gait training via stepping L foot forward/backward.  As pt fatigued, R knee  buckled and she had to sit quickly.  Make- up minutes: Timed voiding, using Stedy for toilet transfer with max assistance.  Pt sat on toilet x 5 min but unable to void.  Max assist for clothing mgt. Steady> bed transfer with min assist.  Sit> supine iwht min assist for RLE.  At end of session, pt resting in bed with [facility] raised and alarm set and needs at hand.  O2 sats 98% and HR 63 on room air.  PT informed LeeAnn, NT that pt was unable to void.     Therapy Documentation Precautions:  Precautions Precautions: Fall Precaution Comments: R hemi Restrictions Weight Bearing Restrictions: No General:   Vital Signs: Therapy Vitals Temp: 97.8 F (36.6 C) Temp Source: Oral Pulse Rate: 60 Resp: 17 BP: (!) 168/74 Patient Position (if appropriate): Lying Oxygen Therapy SpO2: 100 % O2 Device: Room Air Pain:   Mobility:   Locomotion :    Trunk/Postural Assessment :    Balance:   Exercises:   Other Treatments:      Therapy/Group: Individual Therapy  Tyheem Boughner 04/08/2022, 4:22 PM

## 2022-04-08 NOTE — Progress Notes (Addendum)
STROKE TEAM PROGRESS NOTE   INTERVAL HISTORY Patient is seen ion her room with her husband at the bedside.  Yesterday, she had an episode of decreased LOC with altered mentation.  Code stroke was called, and head CT was performed.  Patient's symptoms resolved spontaneously, and she is back to baseline today.  No acute changes seen on CT scan.  Vitals:   04/07/22 1345 04/07/22 1417 04/07/22 1942 04/08/22 0532  BP: (!) 102/56 (!) 109/57 129/60 (!) 119/56  Pulse: (!) 56 (!) 55 62 60  Resp: (!) '22 17 16 16  '$ Temp: 98.4 F (36.9 C) 98.3 F (36.8 C) 98.2 F (36.8 C) 97.7 F (36.5 C)  TempSrc: Oral Oral Oral Oral  SpO2: 95% 94% 95% 99%  Weight:      Height:       CBC:  Recent Labs  Lab 04/03/22 0529 04/08/22 0617  WBC 8.8 8.3  NEUTROABS 4.9  --   HGB 8.9* 8.9*  HCT 27.9* 27.3*  MCV 91.5 91.3  PLT 260 620   Basic Metabolic Panel:  Recent Labs  Lab 04/03/22 0529 04/08/22 0617  NA 138 138  K 3.7 3.7  CL 110 108  CO2 23 24  GLUCOSE 94 97  BUN 15 13  CREATININE 0.85 0.97  CALCIUM 8.9 9.0   Lipid Panel: No results for input(s): CHOL, TRIG, HDL, CHOLHDL, VLDL, LDLCALC in the last 168 hours. HgbA1c: No results for input(s): HGBA1C in the last 168 hours. Urine Drug Screen: No results for input(s): LABOPIA, COCAINSCRNUR, LABBENZ, AMPHETMU, THCU, LABBARB in the last 168 hours.  Alcohol Level No results for input(s): ETH in the last 168 hours.  IMAGING past 24 hours No results found.  PHYSICAL EXAM General:  Alert, well-developed, well-nourished elderly patient in no acute distress Respiratory: Regular, unlabored respirations on room air  NEURO:  Mental Status: AA&Ox3  Speech/Language: speech is without dysarthria or aphasia.  Naming, repetition, fluency, and comprehension intact.  Cranial Nerves:  II: PERRL. Visual fields full. Color vision intact. Snellen OD:         OS:         OU:  III, IV, VI: EOMI. Eyelids elevate symmetrically.  V: Sensation is intact to light  touch and symmetrical to face.  VII: Smile is symmetrical. Able to puff cheeks and raise eyebrows.  VIII: hearing intact to voice. IX, X: Palate elevates symmetrically. Phonation is normal.  BT:DHRCBULA shrug 5/5. XII: tongue is midline without fasciculations. Motor: 5/5 strength to all muscle groups tested.  Tone: is normal and bulk is normal Sensation- Intact to light touch bilaterally. Extinction absent to light touch to DSS. Sharp/Dull   Vibration.   Coordination: FTN intact bilaterally, HKS: no ataxia in BLE.No drift.  DTRs: 2+ throughout Gait- deferred   ASSESSMENT/PLAN Ms. SCHERYL SANBORN is a 88 y.o. female with history of atrial fibrillation (not on anticoagulation due to recent [place]), HTN, DVT, PVD, GERD, hypothyroidism and recent admission for ICH now in CIR presenting with an episode of decreased LOC and altered mentation yesterday morning which resolved spontaneously.  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 Atrophy. CTA head & neck no LVO or hemodynamically significant stenosis MRI  left thalamic ICH Carotid Doppler  1-39% stenosis in bilateral carotids 2D Echo EF 55-60%, mildly dilated left atrium, no atrial level shunt LDL 94 HgbA1c 5.9 VTE prophylaxis - SQH Eliquis (apixaban) daily prior to admission, now on No antithrombotic secondary to IPH. Therapy recommendations:  CIR Disposition:  CIR  Hypertension Home meds:  hydralazine 50 mg TID, losartan 100 mg daily Stable Keep SBP <160 Long-term BP goal normotensive  Hyperlipidemia Home meds:  atorvastatin 20 mg daily LDL  94, goal < 70 High intensity statin not indicated due to IPH continue statin at discharge  Atrial fibrillation Patient has known atrial fibrillation Was taking Eliquis at home Eliquis on hold due to recent [place] Follow up with Dr Leonie Man at [facility] to consider resume eliquis or ASPIRE trial.   Other Stroke Risk Factors Advanced Age >/= 56   Other Active Problems none  Hospital day # [place] , MSN, AGACNP-BC [facility] See Amion for schedule and pager information 04/08/2022 12:17 PM   ATTENDING NOTE: I reviewed above note and agree with the assessment and plan. Pt was seen and examined.   Patient sitting in chair, son at bedside.  Patient awake alert orientated.  Had episode of decreased LOC and altered mentation yesterday.  Code stroke activated, CT no acute finding, left thalamic ICH involving with focal mass lesion, as expected.  CT head and neck no LVO.  Patient woke up after CT and gradually back to her baseline.  Consider encephalopathy instead of TIA or stroke.  Patient right-sided weakness much improved with PT/OT in rehab, today had left shoulder arthritis flareup, decreased left shoulder ROM.  Otherwise doing well so far.  Continue current management.  Follow-up with Dr. Leonie Man at [facility] in 2 to 3 weeks to consider resume Eliquis versus aspire trial.  For detailed assessment and plan, please refer to above as I have made changes wherever appropriate.   Neurology will sign off. Please call with questions. Pt will follow up with stroke clinic Dr. Leonie Man at [facility] in about 2-3 weeks. Thanks for the consult.  Rosalin Hawking, MD PhD Stroke Neurology 04/08/2022 6:15 PM  I had long discussion with son and patient at bedside, updated pt current condition, treatment plan and potential prognosis, and answered all the questions.  They expressed understanding and appreciation.   To contact Stroke Continuity provider, please refer to [email or web address] After hours, contact General  Neurology

## 2022-04-08 NOTE — Progress Notes (Signed)
Physical Therapy Session Note  Patient Details  Name: [name] MRN: [id number] Date of Birth: 14-Dec-1922  Today's Date: 04/08/2022 PT Individual Time: 0815 - 0830, 15 min     Short Term Goals: Week 1:  PT Short Term Goal 1 (Week 1): pt will roll side to side w/ CGA. PT Short Term Goal 2 (Week 1): Pt will transfer sup/sidelying to sit w/ min A PT Short Term Goal 3 (Week 1): Pt will transfer sit to stand w/ mod A PT Short Term Goal 4 (Week 1): Pt will perform SPT w/ mod A  Skilled Therapeutic Interventions/Progress Updates:  Make up session: Pt resting in bed, with HOB elevated.  She denied pain.  Son Lucianne Lei present in room.  Pt on 1.5L O2 /min via [place].  Sats 98%, HR 63 with bed level exercises during session.   neuromuscular re-education via multimodal cues and visual targets for 2 x 10 each: bil adductor squeezes, bil quad sets, R straight leg raises, bil ankle pumps, bil ankle circles.  Pt rested briefly between q sets.  Pt exhibited motor apraxia intermittently, R and LLE movements.  At end of session, Benjamine Mola, OT entering room.  Pt propped up in bed.   Therapy Documentation Precautions:  Precautions Precautions: Fall Precaution Comments: R hemi Restrictions Weight Bearing Restrictions: No    Therapy/Group: Individual Therapy  Shaunita Seney 04/08/2022, 2:48 PM

## 2022-04-08 NOTE — Progress Notes (Signed)
Occupational Therapy Session Note  Patient Details  Name: [name] MRN: [id number] Date of Birth: [birth year]  Today's Date: 04/08/2022 OT Individual Time: 1015-1045 OT Individual Time Calculation (min): 30 min    Short Term Goals: Week 1:  OT Short Term Goal 1 (Week 1): Pt will don a shirt with no more than min cueing for hemi technique and min A OT Short Term Goal 2 (Week 1): Pt will don pants with mod A OT Short Term Goal 3 (Week 1): Pt will maintain sitting balance EOB during ADLs with no more than CGA OT Short Term Goal 4 (Week 1): Pt will complete stand pivot transfer with max of 1  Skilled Therapeutic Interventions/Progress Updates:    Pt received in recliner with her son present. Pt had just finished toileting with nursing.  Pt agreeable to working on RUE movement.  She used UE Ranger for a/arom of shoulder, added on resistance to pt pushing ranger out and she was able to activate her triceps well.   For bilateral control, pt placed both hands on soft ball and worked on pushing ball forward and back.  She c/o L shoulder pain from overuse,especially with pulling up on stedy lift.  She has an old sh injury.  Discussed alternative strategies with using stedy but next therapist will need to practice this with her hands on.   For purposeful movement, pt practiced using her R hand to pet a dog during Pet therapy.  Pt did well controlling her movements with that closed chain activity of touching the dog.   She does have some increased tone that makes her movements more rigid, along with difficulty controlling smoothness of her movements with open chain activities of reaching out for objects.  Pt resting in recliner with all needs met and chair alarm on.    Therapy Documentation Precautions:  Precautions Precautions: Fall Precaution Comments: R hemi Restrictions Weight Bearing Restrictions: No  Vital Signs: Therapy Vitals Temp: 97.7 F (36.5 C) Temp Source: Oral Pulse Rate:  60 Resp: 16 BP: (!) 119/56 Patient Position (if appropriate): Lying Oxygen Therapy SpO2: 99 % O2 Device: Nasal Cannula O2 Flow Rate (L/min): 1.5 L/min Patient Activity (if Appropriate): In bed Pain: Pain Assessment Pain Scale: 0-10 Pain Score: 0-No pain ADL: ADL Eating: Supervision/safety Where Assessed-Eating: Bed level Grooming: Supervision/safety, Setup, Minimal cueing Where Assessed-Grooming: Chair Upper Body Bathing: Minimal assistance Where Assessed-Upper Body Bathing: Edge of bed Lower Body Bathing: Moderate assistance Where Assessed-Lower Body Bathing: Edge of bed Upper Body Dressing: Moderate cueing, Moderate assistance Where Assessed-Upper Body Dressing: Edge of bed Lower Body Dressing: Dependent Where Assessed-Lower Body Dressing: Edge of bed Toileting: Unable to assess Toilet Transfer: Other (comment) (max +2) Toilet Transfer Method: Stand pivot, Other (comment) (stedy) Tub/Shower Transfer: Unable to assess   Therapy/Group: Individual Therapy  Bent 04/08/2022, 8:29 AM

## 2022-04-08 NOTE — Progress Notes (Signed)
PROGRESS NOTE   Subjective/Complaints:  Pt with intermittent lethargy over weekend. Code stroke called yesterday   Objective:   CT HEAD CODE STROKE WO CONTRAST  Result Date: 04/07/2022 CLINICAL DATA:  Code stroke. Neuro deficit with acute stroke suspected. Sudden change in mentation. EXAM: CT HEAD WITHOUT CONTRAST TECHNIQUE: Contiguous axial images were obtained from the base of the skull through the vertex without intravenous contrast. RADIATION DOSE REDUCTION: This exam was performed according to the departmental dose-optimization program which includes automated exposure control, adjustment of the mA and/or kV according to patient size and/or use of iterative reconstruction technique. COMPARISON:  Eleven days prior FINDINGS: Brain: Subacute hematoma at the left thalamus has become more indistinct and is minimally contracted with 18 mm maximal measurement. No increase in the lateral ventricular hemorrhage. No hydrocephalus. Expected edema and mild expansion around the hematoma. No evidence of acute gray matter infarction. No extra-axial collection. Cerebral volume loss and chronic small vessel ischemia. Vascular: No hyperdense vessel or unexpected calcification. Skull: Normal. Negative for fracture or focal lesion. Sinuses/Orbits: Bilateral cataract resection Other: Prelim sent in epic chat. ASPECTS [facility] Stroke Program Early CT Score) -not scored with this history and appearance IMPRESSION: 1. Expected evolution of the subacute left thalamic hematoma. Intraventricular clot is diminished and there is no hydrocephalus. 2. Brain atrophy and chronic small vessel ischemia. Electronically Signed   By: Jorje Guild M.D.   On: 04/07/2022 10:45   CT ANGIO HEAD NECK W WO CM (CODE STROKE)  Result Date: 04/07/2022 CLINICAL DATA:  Neuro deficit, acute.  Stroke suspected. EXAM: CT ANGIOGRAPHY HEAD AND NECK TECHNIQUE: Multidetector CT imaging of the  head and neck was performed using the standard protocol during bolus administration of intravenous contrast. Multiplanar CT image reconstructions and MIPs were obtained to evaluate the vascular anatomy. Carotid stenosis measurements (when applicable) are obtained utilizing NASCET criteria, using the distal internal carotid diameter as the denominator. RADIATION DOSE REDUCTION: This exam was performed according to the departmental dose-optimization program which includes automated exposure control, adjustment of the mA and/or kV according to patient size and/or use of iterative reconstruction technique. CONTRAST:  1103m OMNIPAQUE IOHEXOL 350 MG/ML SOLN COMPARISON:  Intracranial MRA 3 scratch the intracranial MRA 03/25/2022 FINDINGS: CTA NECK FINDINGS Aortic arch: Atheromatous plaque which is extensive. No dilatation or acute finding. Three vessel branching. Right carotid system: Diffuse atheromatous wall thickening. No stenosis or ulceration. Left carotid system: Diffuse atheromatous wall thickening. No stenosis or ulceration. Vertebral arteries: Proximal subclavian atherosclerosis. Codominant vertebral arteries. The vertebral arteries are tortuous but no beading or flow limiting stenosis. Skeleton: Diffuse cervical spine degeneration. No acute or aggressive finding Other neck: Presumed mucus which is nonocclusive at the level of the upper thoracic trachea. Midline nasopharyngeal polyp without aggressive feature. Upper chest: Small volume left pleural effusion which is layering. Review of the MIP images confirms the above findings CTA HEAD FINDINGS Anterior circulation: Atheromatous calcification along the carotid siphons. No major branch occlusion, beading, or flow limiting stenosis. Posterior circulation: Codominant vertebral arteries. The vertebral and basilar arteries are diffusely patent with only mild atheromatous irregularity. Atheromatous irregularity of the bilateral PCA, mild for age. Venous sinuses:  Diffusely patent Anatomic variants:  None significant Review of the MIP images confirms the above findings IMPRESSION: 1. No emergent finding. 2. Atherosclerosis without no large vessel occlusion or flow limiting stenosis of major vessels. 3. No evidence of vascular lesion underlying the patient's subacute left thalamic hematoma. Electronically Signed   By: Jorje Guild M.D.   On: 04/07/2022 11:10    Recent Labs    04/08/22 0617  WBC 8.3  HGB 8.9*  HCT 27.3*  PLT 303    Recent Labs    04/08/22 0617  NA 138  K 3.7  CL 108  CO2 24  GLUCOSE 97  BUN 13  CREATININE 0.97  CALCIUM 9.0     Intake/Output Summary (Last 24 hours) at 04/08/2022 0956 Last data filed at 04/08/2022 0816 Gross per 24 hour  Intake 1007 ml  Output 500 ml  Net 507 ml        Physical Exam: Vital Signs Blood pressure (!) 119/56, pulse 60, temperature 97.7 F (36.5 C), temperature source Oral, resp. rate 16, height '5\' 6"'$  (1.676 m), weight 70.3 kg, SpO2 99 %.  Constitutional: No distress . Vital signs reviewed. HEENT: NCAT, EOMI, oral membranes moist Neck: supple Cardiovascular: RRR without murmur. No JVD    Respiratory/Chest: CTA Bilaterally without wheezes or rales. Normal effort    GI/Abdomen: BS +, non-tender, non-distended Ext: no clubbing, cyanosis, or edema Psych: pleasant and cooperative  Skin: Clean and intact without signs of breakdown Neuro:  pt alert and oriented to person, hospital. Follows commands. Language at baseline. Minimal right central 7.  RUE 3/5 at least. RLE 2-3/5. Sensation intact for pain and LT    Musculoskeletal: Able to perform AROM and PROM of limbs and trunk without pain.    Assessment/Plan: 1. Functional deficits which require 3+ hours per day of interdisciplinary therapy in a comprehensive inpatient rehab setting. Physiatrist is providing close team supervision and 24 hour management of active medical problems listed below. Physiatrist and rehab team continue to  assess barriers to discharge/monitor patient progress toward functional and medical goals  Care Tool:  Bathing    Body parts bathed by patient: Right arm, Left arm, Chest, Abdomen, Right upper leg, Left upper leg, Face   Body parts bathed by helper: Front perineal area, Buttocks, Right lower leg, Left lower leg     Bathing assist Assist Level: Moderate Assistance - Patient 50 - 74%     Upper Body Dressing/Undressing Upper body dressing   What is the patient wearing?: Pull over shirt    Upper body assist Assist Level: Maximal Assistance - Patient 25 - 49%    Lower Body Dressing/Undressing Lower body dressing      What is the patient wearing?: Pants     Lower body assist Assist for lower body dressing: 2 Helpers     Toileting Toileting    Toileting assist Assist for toileting: 2 Helpers     Transfers Chair/bed transfer  Transfers assist     Chair/bed transfer assist level: Total Assistance - Patient < 25%     Locomotion Ambulation   Ambulation assist   Ambulation activity did not occur: Safety/medical concerns          Walk 10 feet activity   Assist  Walk 10 feet activity did not occur: Safety/medical concerns        Walk 50 feet activity   Assist Walk 50 feet with 2 turns activity did not occur: Safety/medical concerns         Walk 150 feet activity  Assist Walk 150 feet activity did not occur: Safety/medical concerns         Walk 10 feet on uneven surface  activity   Assist Walk 10 feet on uneven surfaces activity did not occur: Safety/medical concerns         Wheelchair     Assist Is the patient using a wheelchair?: Yes Type of Wheelchair: Manual    Wheelchair assist level: Minimal Assistance - Patient > 75% Max wheelchair distance: 20    Wheelchair 50 feet with 2 turns activity    Assist        Assist Level: Maximal Assistance - Patient 25 - 49% (Per PT documentation of 20 feet)   Wheelchair 150  feet activity     Assist      Assist Level: Total Assistance - Patient < 25% (Per PT documentation of 20 feet)   Blood pressure (!) 119/56, pulse 60, temperature 97.7 F (36.5 C), temperature source Oral, resp. rate 16, height '5\' 6"'$  (1.676 m), weight 70.3 kg, SpO2 99 %.  Medical Problem List and Plan: 1. Functional deficits secondary to left thalamic intracranial hemorrhage             -patient may shower             -ELOS/Goals: 14-16 days, supervision to min assist  -Continue CIR therapies including PT, OT, and SLP . Have reduced intensity to 15/7  -has had waxing and waning of arousal and mental status  -CT/CTA without new disease and demonstrate expected evolution  -continue to observe clinically, avoid neurosedating meds.    -check tsh/free T4 tomorrow   -today's labs look ok, consider ABG 2.  Antithrombotics: -DVT/anticoagulation:  Pharmaceutical: Heparin             -antiplatelet therapy: none 3. Pain Management: Tylenol as needed. Neurontin 300 mg q HS 4. Mood: team providing ego support             -antipsychotic agents: n/a  -family is providing great emotional support 5. Neuropsych: This patient is not capable of making decisions on her own behalf. 6. Skin/Wound Care: Routine skin care checks 7. Fluids/Electrolytes/Nutrition:               --dysphagia 3 diet with thin liquids  -seems to be tolerating thus far  -albumin low, changed to Boost Breeze per pt preference 8. Hypertension: continue diltiazem, hydralazine, nadolol. Home losartan held  -5/26 bp well controlled currently 9: Hyperlipidemia: continue atorvastatin 10: GI prophylaxis: continue pantoprazole 11: pAF: continue diltiazem. No AC until resolution of ICH, outpt follow-up 12: Hypothyroidism: continue Synthroid 13: Urinary retention: continue Flomax             -5/29 pt is voiding well 14: UTI:  Keflex completed  -5/26 repeat UA neg, UCX neg also 15: AKI: BUN and Cr continue downward trend              -encouraging fluids.  16: Leukocytosis: wbc's normal. Recheck 5/29 17: Anemia, chronic: no evidence of acute blood loss -hgb 8.9--continue to track--recheck 5/29  18: Polymyalgia rheumatica: prednisone 2 mg with lunch 19. Dysphagia: tolerating D3/thin diet             -advance per SLP 20. Dry eye, blurry vision   Pt has macular degeneration of the right eye, uses preservision at home. -Placed order for Prosight (the pharmacy equivalent to Preservision and artifical tears.    529: Conjunctiva appear more clear    Continue use of  artifical tears and Prosight.    LOS: 6 days A FACE TO FACE EVALUATION WAS PERFORMED  Meredith Staggers 04/08/2022, 9:56 AM

## 2022-04-09 DIAGNOSIS — D638 Anemia in other chronic diseases classified elsewhere: Secondary | ICD-10-CM | POA: Diagnosis not present

## 2022-04-09 DIAGNOSIS — I69391 Dysphagia following cerebral infarction: Secondary | ICD-10-CM | POA: Diagnosis not present

## 2022-04-09 DIAGNOSIS — I610 Nontraumatic intracerebral hemorrhage in hemisphere, subcortical: Secondary | ICD-10-CM | POA: Diagnosis not present

## 2022-04-09 DIAGNOSIS — I10 Essential (primary) hypertension: Secondary | ICD-10-CM | POA: Diagnosis not present

## 2022-04-09 LAB — BLOOD GAS, ARTERIAL
Acid-base deficit: 0.5 mmol/L (ref 0.0–2.0)
Bicarbonate: 24.2 mmol/L (ref 20.0–28.0)
Drawn by: 42783
O2 Saturation: 97.5 %
Patient temperature: 36.4
pCO2 arterial: 38 mmHg (ref 32–48)
pH, Arterial: 7.41 (ref 7.35–7.45)
pO2, Arterial: 77 mmHg — ABNORMAL LOW (ref 83–108)

## 2022-04-09 LAB — T4, FREE: Free T4: 1.68 ng/dL — ABNORMAL HIGH (ref 0.61–1.12)

## 2022-04-09 LAB — TSH: TSH: 1.452 u[IU]/mL (ref 0.350–4.500)

## 2022-04-09 NOTE — Progress Notes (Signed)
Physical Therapy Session Note  Patient Details  Name: [name] MRN: [id number] Date of Birth: 24-Feb-1923  Today's Date: 04/09/2022 PT Individual Time: 0911-0946 PT Individual Time Calculation (min): 35 min   Short Term Goals: Week 1:  PT Short Term Goal 1 (Week 1): pt will roll side to side w/ CGA. PT Short Term Goal 2 (Week 1): Pt will transfer sup/sidelying to sit w/ min A PT Short Term Goal 3 (Week 1): Pt will transfer sit to stand w/ mod A PT Short Term Goal 4 (Week 1): Pt will perform SPT w/ mod A  Skilled Therapeutic Interventions/Progress Updates:     Patient in recliner in the room requesting to go to the bathroom upon PT arrival. Patient alert and agreeable to PT session. Patient denied pain during session.  Patient dependently transferred to the toilet using the [facility] with mod-max A for boosting up and hand over hand assist for R hand placement. Provided cues to reduce R lean in standing and sitting during transfer. Required max A for controled descent onto [facility] over toilet. Patient was continent of bowel during toileting. Provided cues to reduce val salva maneuver and promote relaxation with rocking to improve motility due to patient reporting constipation. Patient attempting standing x2 in the St[place] from the [facility] over the toilet, unable to complete stand due to fatigue from BM. Able standing with max A +2 and peri-care and lower body clothing management performed with total A. Patient returned to the recliner dependently in the [place] and returned to sitting with mod A of 1. Patient reported fatigue and need to recover from [facility]. Nursing requesting to perform bladder scan and cath due to >12 hours since last void. Handed patient off to NT, patient missed 25 min of skilled PT due to fatigue/nursing care, RN made aware. Will attempt to make-up missed time as able.    Therapy Documentation Precautions:  Precautions Precautions: Fall Precaution Comments: R hemi Restrictions Weight  Bearing Restrictions: No General: PT Amount of Missed Time (min): 25 Minutes PT Missed Treatment Reason: Nursing care  Therapy/Group: Individual Therapy  Quinzell Malcomb L Clariece Roesler PT, DPT  04/09/2022, 9:58 AM

## 2022-04-09 NOTE — Progress Notes (Signed)
Patient ID: [name], female   DOB: 03/21/1923, 88 y.o.   MRN: [id number]  SW met with pt and pt son Tom in room to provide updates from team conference, and d/c date 6/20. SW will continue to provide updates.    , MSW, LCSWA Office: [phone] Cell: [phone] Fax: [phone]  

## 2022-04-09 NOTE — Patient Care Conference (Signed)
Inpatient RehabilitationTeam Conference and Plan of Care Update Date: 04/09/2022   Time: 10:36 AM    Patient Name: [name]      Medical Record Number: [id number]  Date of Birth: 09-Apr-1923 Sex: Female         Room/Bed: 4M02C/4M02C-01 Payor Info: Payor: [employer] MEDICARE / Plan: [employer] MEDICARE CHOICE PPO / Product Type: *No Product type* /    Admit Date/Time:  04/02/2022  4:45 PM  Primary Diagnosis:  ICH (intracerebral hemorrhage) [facility])  Hospital Problems: Principal Problem:   ICH (intracerebral hemorrhage) [facility])    Expected Discharge Date: Expected Discharge Date: 04/30/22  Team Members Present: Physician leading conference: Dr. Alger Simons Social Worker Present: Loralee Pacas, [place] Nurse Present: Dorthula Nettles, RN PT Present: Apolinar Junes, PT OT Present: Cherylynn Ridges, OT SLP Present: Weston Anna, SLP PPS Coordinator present : Gunnar Fusi, SLP     Current Status/Progress Goal Weekly Team Focus  Bowel/Bladder   difficulty starting tream, unable to urinate. In/Out q 8 hours  regain cont.  toilet patient 2-3 hours when awake to [facility].   Swallow/Nutrition/ Hydration             ADL's   Max A BADL Tasks, mod/max A transfers, can stand in [place] with mod A, apraxic  Min A  self-care retraining, balance, activity tolerance, R NMR, R attention, initiation   Mobility   mod-max A overall  min A overall  balance, functional transfers, activity tolerance, R hemi-body NMR, R attention, initiate gait training, patient/caregiver education   Communication   sup-to-min A  mod I speech intelligiblity; sup A word finding  speech strategies, word finding strategies   Safety/Cognition/ Behavioral Observations  min A basic level tasks  sup A  memory strategies, anticipatory awareness, basic problem solving   Pain   denies pain  pain<3  assess pain q shift and PRN   Skin   no skin breakdown. Eccymosis to BUE  intact  assess skin q shift and PRN     Discharge  Planning:  D/c tohome with hired caregivers 4 days per week 9am-3pm; and rotating support from children .   Team Discussion: Code stroke called over weekend due to lethargy. Labs normal. Will continue to adjust medications. Requiring O2. 15/7 therapy ordered. Hx. Anxiety. Continent B/B, no pain reported. Discharging home with hired caregivers.   Patient on target to meet rehab goals: yes, min assist goals. Currently max assist lower body, mod assist upper body. Mod assist standing, apraxic. Hand over hand assist. Mod/max assist overall. Min assist speech intelligibility and word finding.  *See Care Plan and progress notes for long and short-term goals.   Revisions to Treatment Plan:  Adjusting medications.   Teaching Needs: Family education, medication management, safety awareness, transfer/training, etc.   Current Barriers to Discharge: Decreased caregiver support, Home enviroment access/layout, and Lack of/limited family support  Possible Resolutions to Barriers: Family education Therapy follow-up Order recommended DME     Medical Summary Current Status: left thalamic infarct, episodes of lethargy, uti rx'ed. recent ct shows improvement, anxiety, sob---abg pending  Barriers to Discharge: Medical stability   Possible Resolutions to [employer] Focus: daily assessment of labs and pt dats, education for family   Continued Need for Acute Rehabilitation Level of Care: The patient requires daily medical management by a physician with specialized training in physical medicine and rehabilitation for the following reasons: Direction of a multidisciplinary physical rehabilitation program to maximize functional independence : Yes Medical management of patient stability for increased  activity during participation in an intensive rehabilitation regime.: Yes Analysis of laboratory values and/or radiology reports with any subsequent need for medication adjustment and/or medical  intervention. : Yes   I attest that I was present, lead the team conference, and concur with the assessment and plan of the team.   Cristi Loron 04/09/2022, 2:32 PM

## 2022-04-09 NOTE — Progress Notes (Signed)
PROGRESS NOTE   Subjective/Complaints:  Pt up and standing with PT at chair in steady. Says that her right knee wants to "buckle". Asked how long it would take to be walking again. No problems overnight reported. Pt still feels a little sob, wearing oxygen  ROS: Patient denies fever, rash, sore throat, blurred vision, dizziness, nausea, vomiting, diarrhea, cough,   chest pain, joint or back/neck pain, headache, or mood change.    Objective:   CT HEAD CODE STROKE WO CONTRAST  Result Date: 04/07/2022 CLINICAL DATA:  Code stroke. Neuro deficit with acute stroke suspected. Sudden change in mentation. EXAM: CT HEAD WITHOUT CONTRAST TECHNIQUE: Contiguous axial images were obtained from the base of the skull through the vertex without intravenous contrast. RADIATION DOSE REDUCTION: This exam was performed according to the departmental dose-optimization program which includes automated exposure control, adjustment of the mA and/or kV according to patient size and/or use of iterative reconstruction technique. COMPARISON:  Eleven days prior FINDINGS: Brain: Subacute hematoma at the left thalamus has become more indistinct and is minimally contracted with 18 mm maximal measurement. No increase in the lateral ventricular hemorrhage. No hydrocephalus. Expected edema and mild expansion around the hematoma. No evidence of acute gray matter infarction. No extra-axial collection. Cerebral volume loss and chronic small vessel ischemia. Vascular: No hyperdense vessel or unexpected calcification. Skull: Normal. Negative for fracture or focal lesion. Sinuses/Orbits: Bilateral cataract resection Other: Prelim sent in epic chat. ASPECTS [facility] Stroke Program Early CT Score) -not scored with this history and appearance IMPRESSION: 1. Expected evolution of the subacute left thalamic hematoma. Intraventricular clot is diminished and there is no hydrocephalus. 2. Brain  atrophy and chronic small vessel ischemia. Electronically Signed   By: Jorje Guild M.D.   On: 04/07/2022 10:45   CT ANGIO HEAD NECK W WO CM (CODE STROKE)  Result Date: 04/07/2022 CLINICAL DATA:  Neuro deficit, acute.  Stroke suspected. EXAM: CT ANGIOGRAPHY HEAD AND NECK TECHNIQUE: Multidetector CT imaging of the head and neck was performed using the standard protocol during bolus administration of intravenous contrast. Multiplanar CT image reconstructions and MIPs were obtained to evaluate the vascular anatomy. Carotid stenosis measurements (when applicable) are obtained utilizing NASCET criteria, using the distal internal carotid diameter as the denominator. RADIATION DOSE REDUCTION: This exam was performed according to the departmental dose-optimization program which includes automated exposure control, adjustment of the mA and/or kV according to patient size and/or use of iterative reconstruction technique. CONTRAST:  166m OMNIPAQUE IOHEXOL 350 MG/ML SOLN COMPARISON:  Intracranial MRA 3 scratch the intracranial MRA 03/25/2022 FINDINGS: CTA NECK FINDINGS Aortic arch: Atheromatous plaque which is extensive. No dilatation or acute finding. Three vessel branching. Right carotid system: Diffuse atheromatous wall thickening. No stenosis or ulceration. Left carotid system: Diffuse atheromatous wall thickening. No stenosis or ulceration. Vertebral arteries: Proximal subclavian atherosclerosis. Codominant vertebral arteries. The vertebral arteries are tortuous but no beading or flow limiting stenosis. Skeleton: Diffuse cervical spine degeneration. No acute or aggressive finding Other neck: Presumed mucus which is nonocclusive at the level of the upper thoracic trachea. Midline nasopharyngeal polyp without aggressive feature. Upper chest: Small volume left pleural effusion which is layering. Review of the MIP images  confirms the above findings CTA HEAD FINDINGS Anterior circulation: Atheromatous calcification  along the carotid siphons. No major branch occlusion, beading, or flow limiting stenosis. Posterior circulation: Codominant vertebral arteries. The vertebral and basilar arteries are diffusely patent with only mild atheromatous irregularity. Atheromatous irregularity of the bilateral PCA, mild for age. Venous sinuses: Diffusely patent Anatomic variants: None significant Review of the MIP images confirms the above findings IMPRESSION: 1. No emergent finding. 2. Atherosclerosis without no large vessel occlusion or flow limiting stenosis of major vessels. 3. No evidence of vascular lesion underlying the patient's subacute left thalamic hematoma. Electronically Signed   By: Jorje Guild M.D.   On: 04/07/2022 11:10    Recent Labs    04/08/22 0617  WBC 8.3  HGB 8.9*  HCT 27.3*  PLT 303    Recent Labs    04/08/22 0617  NA 138  K 3.7  CL 108  CO2 24  GLUCOSE 97  BUN 13  CREATININE 0.97  CALCIUM 9.0     Intake/Output Summary (Last 24 hours) at 04/09/2022 0916 Last data filed at 04/09/2022 0815 Gross per 24 hour  Intake 780 ml  Output 500 ml  Net 280 ml        Physical Exam: Vital Signs Blood pressure (!) 91/46, pulse (!) 56, temperature (!) 97.5 F (36.4 C), resp. rate 18, height '5\' 6"'$  (1.676 m), weight 70.3 kg, SpO2 99 %.  Constitutional: No distress . Vital signs reviewed. HEENT: NCAT, EOMI, oral membranes moist, O2 via [place].  Neck: supple Cardiovascular: RRR without murmur. No JVD    Respiratory/Chest: CTA Bilaterally without wheezes or rales. Normal effort    GI/Abdomen: BS +, non-tender, non-distended Ext: no clubbing, cyanosis, or edema Psych: pleasant and cooperative   Skin: Clean and intact without signs of breakdown Neuro:  alert oriented to person, reason she's here, hospital.  Follows commands. Language at baseline. Minimal right central 7.  RUE 3/5 at least. RLE 3-/5. Leans to right in standing but is putting weight thru right leg. Sensation intact for pain and  LT    Musculoskeletal: Able to perform AROM and PROM of limbs and trunk without pain.    Assessment/Plan: 1. Functional deficits which require 3+ hours per day of interdisciplinary therapy in a comprehensive inpatient rehab setting. Physiatrist is providing close team supervision and 24 hour management of active medical problems listed below. Physiatrist and rehab team continue to assess barriers to discharge/monitor patient progress toward functional and medical goals  Care Tool:  Bathing    Body parts bathed by patient: Right arm, Left arm, Chest, Abdomen, Right upper leg, Left upper leg, Face   Body parts bathed by helper: Front perineal area, Buttocks, Right lower leg, Left lower leg     Bathing assist Assist Level: Moderate Assistance - Patient 50 - 74%     Upper Body Dressing/Undressing Upper body dressing   What is the patient wearing?: Pull over shirt    Upper body assist Assist Level: Maximal Assistance - Patient 25 - 49%    Lower Body Dressing/Undressing Lower body dressing      What is the patient wearing?: Pants     Lower body assist Assist for lower body dressing: 2 Helpers     Toileting Toileting    Toileting assist Assist for toileting: 2 Helpers     Transfers Chair/bed transfer  Transfers assist     Chair/bed transfer assist level: Minimal Assistance - Patient > 75%     Locomotion Ambulation  Ambulation assist   Ambulation activity did not occur: Safety/medical concerns  Assist level: 2 helpers Assistive device: No Device Max distance: 4   Walk 10 feet activity   Assist  Walk 10 feet activity did not occur: Safety/medical concerns        Walk 50 feet activity   Assist Walk 50 feet with 2 turns activity did not occur: Safety/medical concerns         Walk 150 feet activity   Assist Walk 150 feet activity did not occur: Safety/medical concerns         Walk 10 feet on uneven surface  activity   Assist Walk  10 feet on uneven surfaces activity did not occur: Safety/medical concerns         Wheelchair     Assist Is the patient using a wheelchair?: Yes Type of Wheelchair: Manual    Wheelchair assist level: Minimal Assistance - Patient > 75% Max wheelchair distance: 20    Wheelchair 50 feet with 2 turns activity    Assist        Assist Level: Maximal Assistance - Patient 25 - 49% (Per PT documentation of 20 feet)   Wheelchair 150 feet activity     Assist      Assist Level: Total Assistance - Patient < 25% (Per PT documentation of 20 feet)   Blood pressure (!) 91/46, pulse (!) 56, temperature (!) 97.5 F (36.4 C), resp. rate 18, height '5\' 6"'$  (1.676 m), weight 70.3 kg, SpO2 99 %.  Medical Problem List and Plan: 1. 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 Interdisciplinary team conference today to discuss goals, barriers to discharge, and dc planning.   -Have reduced intensity to 15/7  -has had waxing and waning of arousal and mental status--two good days in a row. Not sure of cause of episode this week, nor is neurology. Might have been fatigue or med related.   -CT/CTA without new disease and demonstrate expected evolution  -continue to observe clinically, avoid neurosedating meds.   -TSH ok, free Tr actually a little high  -will check ABG today given ongoing perceived sob, need of O2 2.  Antithrombotics: -DVT/anticoagulation:  Pharmaceutical: Heparin             -antiplatelet therapy: none 3. Pain Management: Tylenol as needed. Neurontin 300 mg q HS--reduced to '200mg'$  4. Mood: team providing ego support             -antipsychotic agents: n/a  -family is providing great emotional support 5. Neuropsych: This patient is not capable of making decisions on her own behalf. 6. Skin/Wound Care:  Routine skin care checks 7. Fluids/Electrolytes/Nutrition:               --dysphagia 3 diet with thin liquids  -seems to be tolerating thus far  -albumin low, changed to Boost Breeze per pt preference 8. Hypertension: continue diltiazem, hydralazine, nadolol. Home losartan held  -5/26 bp well controlled currently 9: Hyperlipidemia: continue atorvastatin 10: GI prophylaxis: continue pantoprazole 11: pAF: continue diltiazem. No AC until resolution of ICH, outpt follow-up 12: Hypothyroidism: continue Synthroid 13: Urinary retention: continue Flomax             - pt is voiding well now! 14:  UTI:  Keflex completed  -  repeat UA neg, UCX neg also 15: AKI: BUN and Cr continue downward trend             -encouraging fluids.  16: Leukocytosis: wbc's normal.   17: Anemia, chronic: no evidence of acute blood loss -hgb 8.9--stable 5/29  18: Polymyalgia rheumatica: prednisone 2 mg with lunch 19. Dysphagia: tolerating D3/thin diet             -advance per SLP 20. Dry eye, blurry vision   Pt has macular degeneration of the right eye, uses preservision at home. -Placed order for Prosight (the pharmacy equivalent to Preservision and artifical tears.     - Conjunctiva appear more clear    Continue use of artifical tears and Prosight.    LOS: 7 days A FACE TO FACE EVALUATION WAS PERFORMED  Meredith Staggers 04/09/2022, 9:16 AM

## 2022-04-09 NOTE — Progress Notes (Signed)
Speech Language Pathology Weekly Progress and Session Note  Patient Details  Name: [name] MRN: [id number] Date of Birth: [birth year]  Beginning of progress report period: Apr 02, 2022 End of progress report period: Apr 09, 2022  Today's Date: 04/09/2022 SLP Individual Time: 1001-1055 SLP Individual Time Calculation (min): 54 min  Short Term Goals: Week 1: SLP Short Term Goal 1 (Week 1): Pt will demonstrate recall of novel, daily information with min A verbal cues for use of external aids. SLP Short Term Goal 1 - Progress (Week 1): Met SLP Short Term Goal 2 (Week 1): Pt will demonstrate basic problem solving skills with supervision A verbal cues. SLP Short Term Goal 2 - Progress (Week 1): Met SLP Short Term Goal 3 (Week 1): Pt will self-monitor and self-correct verbal and functional errors with supervision A verbal cues. SLP Short Term Goal 3 - Progress (Week 1): Met SLP Short Term Goal 4 (Week 1): Pt will demonstrate word finding skills in higher level task (ex: divergent/convergent naming and abstract topics) with min A semantic cues. SLP Short Term Goal 4 - Progress (Week 1): Met SLP Short Term Goal 5 (Week 1): Pt will demonstrate 85% speech intelligibility sentence level with supervision A verbal cues for over articulation and rate. SLP Short Term Goal 5 - Progress (Week 1): Met SLP Short Term Goal 6 (Week 1): Pt will consume regular textures and thin liquids with no overt s/s aspiration and mod I use of swallow strategies. SLP Short Term Goal 6 - Progress (Week 1): Met    New Short Term Goals: Week 2: SLP Short Term Goal 1 (Week 2): Pt will demonstrate 90% speech intelligibility sentence level with Mod I for over articulation and rate SLP Short Term Goal 2 (Week 2): Pt will demonstrate word finding skills in higher level task (ex: divergent/convergent naming and abstract topics) with Supervision A semantic cues. SLP Short Term Goal 3 (Week 2): Pt will demonstrate mildly  complex problem solving skills with supervision A verbal cues. SLP Short Term Goal 4 (Week 2): Pt will demonstrate recall of novel, daily information with Supevision A verbal cues for use of external aids.  Weekly Progress Updates: Pt has made steady gains meeting 6 out of 6 short term goals this reporting period. Pt is current communicating with >85%  intelligibility at simple sentence level with cues for compensatory strategies, still endorses speech changes s/p CVA. Pt is consuming regular/thin diet with no difficulty or s/s aspiration, continue with full supervision. Pt has been limited by fatigue and difficulty tolerating full therapy session, however is completing simple cognitive tasks with Supervision-Min A. Pt family is very supportive and has been present for treatment session, education and training is ongoing. Recommend 24/7 supervision at discharge. Pt will continue to benefit from skilled ST during CIR stay to increase safety and independence with daily routine and reduce caregiver burden.   Intensity: Minumum of 1-2 x/day, 30 to 90 minutes Frequency: 3 to 5 out of 7 days Duration/Length of Stay: 3-4 weeks Treatment/Interventions: Cognitive remediation/compensation;Cueing hierarchy;Functional tasks;Internal/external aids;Speech/Language facilitation;Patient/family education;Dysphagia/aspiration precaution training   Daily Session  Skilled Therapeutic Interventions: Pt seen for skilled ST with focus on cognitive goals, pt in recliner and agreeable to therapeutic tasks. Pt able to detail events of the morning (therapy session, blood draw, etc) with Supervision A cues for accuracy. SLP facilitating simple divergent naming tasks by providing overall mod A verbal cues and extra time. Pt reports "this is hard" and educated on word finding  strategies to increase communication and reduce frustration. Pt engaged in simple problem ID/problem solving task with picture prompts benefiting from min A  cues to ID problematic or unsafe scenarios. Once scenarios Id'ed, pt able to produce appropriate solution with 80% accuracy. Pt reports no difficulty with regular/thin diet and denies any pocketing in R cheek, discussed pt meeting all STGs this week. Pt left in recliner with alarm belt activated and all needs within reach. Cont ST POC.  General    Pain Pain Assessment Pain Scale: 0-10 Pain Score: 0-No pain  Therapy/Group: Individual Therapy  Dewaine Conger 04/09/2022, 10:49 AM

## 2022-04-09 NOTE — Progress Notes (Signed)
Physical Therapy Session Note  Patient Details  Name: [name] MRN: [id number] Date of Birth: [birth year]  Today's Date: 04/09/2022 PT Individual Time: 0800-0830 PT Individual Time Calculation (min): 30 min   Short Term Goals: Week 1:  PT Short Term Goal 1 (Week 1): pt will roll side to side w/ CGA. PT Short Term Goal 2 (Week 1): Pt will transfer sup/sidelying to sit w/ min A PT Short Term Goal 3 (Week 1): Pt will transfer sit to stand w/ mod A PT Short Term Goal 4 (Week 1): Pt will perform SPT w/ mod A  Skilled Therapeutic Interventions/Progress Updates:    pt received in bed and agreeable to therapy. Supine>sit with min A and bed features. Sitting balance with LUE support and supervision. Sit to stand to stedy with min A from elevated bed, dependent transfer to recliner. Pt then performed Sit to stand 2 x 5, with pt standing x 2 min while MD in/out for rounds. Pt able to stand with supervision, no knee buckling noted. Continued apraxia of RUE and LE, with pt requiring assist to place R hand throughout session. Pt remained in recliner at end of session and was left with all needs in reach and alarm active.   Therapy Documentation Precautions:  Precautions Precautions: Fall Precaution Comments: R hemi Restrictions Weight Bearing Restrictions: No General: PT Amount of Missed Time (min): 25 Minutes PT Missed Treatment Reason: Nursing care     Therapy/Group: Individual Therapy  Mickel Fuchs 04/09/2022, 11:18 AM

## 2022-04-09 NOTE — Progress Notes (Signed)
Occupational Therapy Weekly Progress Note  Patient Details  Name: [name] MRN: [id number] Date of Birth: April 12, 1923  Beginning of progress report period: Apr 02, 2022 End of progress report period: Apr 09, 2022  Today's Date: 04/09/2022 OT Individual Time: 1300-1400 OT Individual Time Calculation (min): 60 min    Patient has met 1 of 4 short term goals.  Patient is making slow, but steady progress towards OT goals. Pt missed some of her therapy minutes this week due to lethargy and code stroke being called, although she was cleared and no stroke was found. For the past few days, patient has been progressing and participating well. She still requires Mod A for UB ADLs, Max A for LB ADLs, Max A for transfers, but can stand with Stedy with mod A of 1. R UE progressing with improved initiation, but still very apraxic. Continue current POC.  Patient continues to demonstrate the following deficits: muscle weakness, decreased cardiorespiratoy endurance, impaired timing and sequencing, abnormal tone, unbalanced muscle activation, motor apraxia, ataxia, decreased coordination, and decreased motor planning, decreased midline orientation, decreased attention to right, right side neglect, and decreased motor planning, decreased initiation, decreased attention, decreased awareness, decreased problem solving, decreased safety awareness, decreased memory, and delayed processing, and decreased sitting balance, decreased standing balance, decreased postural control, hemiplegia, and decreased balance strategies and therefore will continue to benefit from skilled OT intervention to enhance overall performance with BADL and Reduce care partner burden.  Patient progressing toward long term goals..  Continue plan of care.  OT Short Term Goals Week 1:  OT Short Term Goal 1 (Week 1): Pt will don a shirt with no more than min cueing for hemi technique and min A OT Short Term Goal 1 - Progress (Week 1): Progressing  toward goal OT Short Term Goal 2 (Week 1): Pt will don pants with mod A OT Short Term Goal 2 - Progress (Week 1): Progressing toward goal OT Short Term Goal 3 (Week 1): Pt will maintain sitting balance EOB during ADLs with no more than CGA OT Short Term Goal 3 - Progress (Week 1): Progressing toward goal OT Short Term Goal 4 (Week 1): Pt will complete stand pivot transfer with max of 1 OT Short Term Goal 4 - Progress (Week 1): Met Week 2:  OT Short Term Goal 1 (Week 2): Pt will don a shirt with no more than min cueing for hemi technique and min A OT Short Term Goal 2 (Week 2): Pt will don pants with mod A OT Short Term Goal 3 (Week 2): Pt will maintain sitting balance EOB during ADLs with no more than CGA OT Short Term Goal 4 (Week 2): Patient will complete sit<>stand at the sink with mod A of 1 during BADL task without Stedy  Skilled Therapeutic Interventions/Progress Updates:    Pt greeted seated in recliner and agreeable to OT treatment session. Pt with bright affect and more conversational today. Pt requested to sponge bathe and change clothes this afternoon. Pt stood with Stedy with max A from lower recliner height. Bathing  completed seated on Stedy perch at  the sink. Addressed functional use of R UE within all bathing tasks with improved initiation on R side, but still very apraxic. Worked on visual target before reaching for objects. Pt needed OT assist to maintain grasp on wash cloth due to frequent dropping.UB dressing with increased time, verbal cues for hemi technique and mod A. LB bathing standing in Stedy at the sink with max  A to wash peri-area and don new brief. Max A for LB bathing/dressing with mod A sit<>stands from wc height, but min A from perched Stedy height. Addressed standing balance/endurance with standing toothbrushing task. Pt with lateral lean to the R requiring moderate verbal cues, facilitation, and mirror feedback to come back to midline. Increased lean with fatigue. Min  A for toothbrushing task. Pt returned to recliner using Stedy and OT set-up mirror for pt to pluck facial hair using tweezers. Pt needed OT assist for thoroughness. Pt left seated in recliner with alarm belt on, call bell in reach, and daughter entering room.  Therapy Documentation Precautions:  Precautions Precautions: Fall Precaution Comments: R hemi Restrictions Weight Bearing Restrictions: No Pain: Pain Assessment Pain Scale: 0-10 Pain Score: 0-No pain   Therapy/Group: Individual Therapy  Valma Cava 04/09/2022, 11:00 AM

## 2022-04-10 LAB — BLOOD GAS, ARTERIAL
Acid-Base Excess: 0.3 mmol/L (ref 0.0–2.0)
Bicarbonate: 24.6 mmol/L (ref 20.0–28.0)
Drawn by: 39899
O2 Saturation: 97.4 %
Patient temperature: 36.8
pCO2 arterial: 38 mmHg (ref 32–48)
pH, Arterial: 7.42 (ref 7.35–7.45)
pO2, Arterial: 79 mmHg — ABNORMAL LOW (ref 83–108)

## 2022-04-10 NOTE — Progress Notes (Addendum)
Nursing and therapist reporting patient lethargy this morning. I was able to arouse her with loud voice and light shaking. She is oriented to place and name. She would not keep her eyes open. She says she slept last night and when asked if she would drink water she states "I guess so." Still requiring urinary catheterization, but UOP volume is good. Discussed with nursing staff and will continue to closely monitor. Discussed with Dr. Naaman Plummer. Will check ABGs.     Latest Ref Rng & Units 04/08/2022    6:17 AM 04/03/2022    5:29 AM 03/31/2022   11:42 AM  BMP  Glucose 70 - 99 mg/dL 97   94   107    BUN 8 - 23 mg/dL '13   15   25    '$ Creatinine 0.44 - 1.00 mg/dL 0.97   0.85   1.04    Sodium 135 - 145 mmol/L 138   138   137    Potassium 3.5 - 5.1 mmol/L 3.7   3.7   4.0    Chloride 98 - 111 mmol/L 108   110   110    CO2 22 - 32 mmol/L '24   23   22    '$ Calcium 8.9 - 10.3 mg/dL 9.0   8.9   8.4       ABG    Component Value Date/Time   PHART 7.42 04/10/2022 1105   PCO2ART 38 04/10/2022 1105   PO2ART 79 (L) 04/10/2022 1105   HCO3 24.6 04/10/2022 1105   TCO2 23 03/25/2022 0255   ACIDBASEDEF 0.5 04/09/2022 0957   O2SAT 97.4 04/10/2022 1105    Re-evlauated at 1230PM. Patient awake and alert times 4. Remembers my earlier eval. Son, Gershon Mussel, at bedside. Nursing encouraged toileting and patient voided on bedpan.

## 2022-04-10 NOTE — Progress Notes (Signed)
PROGRESS NOTE   Subjective/Complaints:  Seems to have had a reasonable night. Was resting when I entered today.   ROS: Limited due to cognitive/behavioral/just waking up    Objective:   No results found.  Recent Labs    04/08/22 0617  WBC 8.3  HGB 8.9*  HCT 27.3*  PLT 303    Recent Labs    04/08/22 0617  NA 138  K 3.7  CL 108  CO2 24  GLUCOSE 97  BUN 13  CREATININE 0.97  CALCIUM 9.0     Intake/Output Summary (Last 24 hours) at 04/10/2022 0815 Last data filed at 04/10/2022 0524 Gross per 24 hour  Intake 540 ml  Output 1300 ml  Net -760 ml        Physical Exam: Vital Signs Blood pressure 115/60, pulse 63, temperature 97.8 F (36.6 C), resp. rate 17, height '5\' 6"'$  (1.676 m), weight 70.3 kg, SpO2 94 %.  Constitutional: No distress . Vital signs reviewed. HEENT: NCAT, EOMI, oral membranes moist Neck: supple Cardiovascular: RRR without murmur. No JVD    Respiratory/Chest: CTA Bilaterally without wheezes or rales. Normal effort    GI/Abdomen: BS +, non-tender, non-distended Ext: no clubbing, cyanosis, or edema Psych: flat but cooperative Skin: Clean and intact without signs of breakdown Neuro:  alert oriented to person, reason she's here.  Follows commands. Language improving.. Minimal right central 7.  RUE 3/5 at least. RLE 3-/5. Sensation intact for pain and LT    Musculoskeletal: Able to perform AROM and PROM of limbs and trunk without pain.    Assessment/Plan: 1. Functional deficits which require 3+ hours per day of interdisciplinary therapy in a comprehensive inpatient rehab setting. Physiatrist is providing close team supervision and 24 hour management of active medical problems listed below. Physiatrist and rehab team continue to assess barriers to discharge/monitor patient progress toward functional and medical goals  Care Tool:  Bathing    Body parts bathed by patient: Right arm, Left  arm, Chest, Abdomen, Right upper leg, Left upper leg, Face   Body parts bathed by helper: Front perineal area, Buttocks, Right lower leg, Left lower leg     Bathing assist Assist Level: Moderate Assistance - Patient 50 - 74%     Upper Body Dressing/Undressing Upper body dressing   What is the patient wearing?: Pull over shirt    Upper body assist Assist Level: Maximal Assistance - Patient 25 - 49%    Lower Body Dressing/Undressing Lower body dressing      What is the patient wearing?: Pants     Lower body assist Assist for lower body dressing: 2 Helpers     Toileting Toileting    Toileting assist Assist for toileting: 2 Helpers     Transfers Chair/bed transfer  Transfers assist     Chair/bed transfer assist level: Minimal Assistance - Patient > 75%     Locomotion Ambulation   Ambulation assist   Ambulation activity did not occur: Safety/medical concerns  Assist level: 2 helpers Assistive device: No Device Max distance: 4   Walk 10 feet activity   Assist  Walk 10 feet activity did not occur: Safety/medical concerns  Walk 50 feet activity   Assist Walk 50 feet with 2 turns activity did not occur: Safety/medical concerns         Walk 150 feet activity   Assist Walk 150 feet activity did not occur: Safety/medical concerns         Walk 10 feet on uneven surface  activity   Assist Walk 10 feet on uneven surfaces activity did not occur: Safety/medical concerns         Wheelchair     Assist Is the patient using a wheelchair?: Yes Type of Wheelchair: Manual    Wheelchair assist level: Minimal Assistance - Patient > 75% Max wheelchair distance: 20    Wheelchair 50 feet with 2 turns activity    Assist        Assist Level: Maximal Assistance - Patient 25 - 49% (Per PT documentation of 20 feet)   Wheelchair 150 feet activity     Assist      Assist Level: Total Assistance - Patient < 25% (Per PT  documentation of 20 feet)   Blood pressure 115/60, pulse 63, temperature 97.8 F (36.6 C), resp. rate 17, height '5\' 6"'$  (1.676 m), weight 70.3 kg, SpO2 94 %.  Medical Problem List and Plan: 1. Functional deficits secondary to left thalamic intracranial hemorrhage             -patient may shower             -ELOS/Goals: 14-16 days, supervision to min assist  -Continue CIR therapies including PT, OT, and SLP  -Have reduced intensity to 15/7  -has had waxing and waning of arousal and mental status--two good days in a row. Not sure of cause of episode this week, nor is neurology. Might have been fatigue or med related.   -CT/CTA without new disease and demonstrate expected evolution  -continue to observe clinically, avoid neurosedating meds.   -TSH ok, free Tr actually a little high  -ABG demonstrates some hypoxia---continue supplemental O2 2.  Antithrombotics: -DVT/anticoagulation:  Pharmaceutical: Heparin             -antiplatelet therapy: none 3. Pain Management: Tylenol as needed. Neurontin 300 mg q HS--reduced to '200mg'$  4. Mood: team providing ego support             -antipsychotic agents: n/a  -family is providing  emotional support as well 5. Neuropsych: This patient is not capable of making decisions on her own behalf. 6. Skin/Wound Care: Routine skin care checks 7. Fluids/Electrolytes/Nutrition:               --dysphagia 3 diet with thin liquids  -seems to be tolerating thus far, intake reasonable  -albumin low, changed to Boost Breeze per pt preference 8. Hypertension: continue diltiazem, hydralazine, nadolol. Home losartan held  -5/26 bp well controlled currently 9: Hyperlipidemia: continue atorvastatin 10: GI prophylaxis: continue pantoprazole 11: pAF: continue diltiazem. No AC until resolution of ICH, outpt follow-up 12: Hypothyroidism: continue Synthroid 13: Urinary retention: continue Flomax             - pt is voiding well now! 14: UTI:  Keflex completed  -  repeat UA  neg, UCX neg also 15: AKI: BUN and Cr continue downward trend             -encouraging fluids.  16: Leukocytosis: wbc's normal.   17: Anemia, chronic: no evidence of acute blood loss -hgb 8.9--stable 5/29  18: Polymyalgia rheumatica: prednisone 2 mg with lunch 19. Dysphagia: tolerating D3/thin  diet             -advance per SLP 20. Dry eye, blurry vision   Pt has macular degeneration of the right eye, uses preservision at home. -Placed order for Prosight (the pharmacy equivalent to Preservision and artifical tears.     - Conjunctiva appear more clear    Continue use of artifical tears and Prosight.    LOS: 8 days A FACE TO FACE EVALUATION WAS PERFORMED  Meredith Staggers 04/10/2022, 8:15 AM

## 2022-04-10 NOTE — Progress Notes (Signed)
Physical Therapy Session Note  Patient Details  Name: [name] MRN: [id number] Date of Birth: 1923/03/31  Today's Date: 04/10/2022 PT Individual Time: 1340-1500 PT Individual Time Calculation (min): 80 min   Short Term Goals: Week 1:  PT Short Term Goal 1 (Week 1): pt will roll side to side w/ CGA. PT Short Term Goal 2 (Week 1): Pt will transfer sup/sidelying to sit w/ min A PT Short Term Goal 3 (Week 1): Pt will transfer sit to stand w/ mod A PT Short Term Goal 4 (Week 1): Pt will perform SPT w/ mod A  Skilled Therapeutic Interventions/Progress Updates:    pt received in bed and agreeable to therapy. No complaint of pain. Supine>sit with min A for trunk management. Sitting balance with supervision with UE support. Donned pants tot A with assist from granddaughter. Stedy transfer with min A over all. Pt required max A to sit to w/c after standing and appearing to be orthostatic with decreased responsiveness. Pt denies any orthostatic symptoms and BP=115/64. Pt recovered immediately upon sitting. Pt transported to therapy gym for time management and energy conservation. Pt performed Sit to stand x 2 in //bars with mod A overall, assist for RUE placement, knee block, and R hip extension. Pt able to slide L foot over but not take step d/t collapsing on R side. Pt reported dizziness once returning to sitting during rest break that recovered within several minutes. Discontinued standing activity d/t dizziness and fatigue. Transitioned to ortho gym, pt performed UBE x 2 min  with RUE stabilized with ace wrap. Pt reports activity felt good and she was able to push with RUE. Introduced squat pivot transfer including, head hips relationship, placement, technique, strategies for RUE. Pt performed w/c<>mat table x 2 with mod A overall. Pt with good understanding of head hips relationship, most assist to clear hips. Pt then stated urgent need to go to bathroom. Pt transported back to room, stedy transfer to  toilet with min A overall. Largely continent void but noted brief was soiled, unsure if leaking prior or just didn't make it. Tot A for 3/3 toileting tasks. After toileting, stedy transfer to recliner with min A. Pt requested to eat a muffin, so therapist provided supervision. Pt remained in recliner and was left with all needs in reach and alarm active.   Therapy Documentation Precautions:  Precautions Precautions: Fall Precaution Comments: R hemi Restrictions Weight Bearing Restrictions: No General:      Therapy/Group: Individual Therapy  Mickel Fuchs 04/10/2022, 3:36 PM

## 2022-04-10 NOTE — Progress Notes (Signed)
Pt very lethargic and slow to respond to commands. Pt was able to stay awake long enough to take her meds. Vital signs taken. PA Risa Grill made aware. No new orders at this time. Will continue to monitor.   [employer]

## 2022-04-10 NOTE — Progress Notes (Signed)
Occupational Therapy Session Note  Patient Details  Name: [name] MRN: [id number] Date of Birth: 1923/02/08  Today's Date: 04/10/2022 OT Individual Time: 0950-1000 OT Individual Time Calculation (min): 10 min  and Today's Date: 04/10/2022 OT Missed Time: 50 Minutes Missed Time Reason: Patient fatigue   Short Term Goals: Week 1:  OT Short Term Goal 1 (Week 1): Pt will don a shirt with no more than min cueing for hemi technique and min A OT Short Term Goal 1 - Progress (Week 1): Progressing toward goal OT Short Term Goal 2 (Week 1): Pt will don pants with mod A OT Short Term Goal 2 - Progress (Week 1): Progressing toward goal OT Short Term Goal 3 (Week 1): Pt will maintain sitting balance EOB during ADLs with no more than CGA OT Short Term Goal 3 - Progress (Week 1): Progressing toward goal OT Short Term Goal 4 (Week 1): Pt will complete stand pivot transfer with max of 1 OT Short Term Goal 4 - Progress (Week 1): Met  Skilled Therapeutic Interventions/Progress Updates:    Pt received supine, very lethargic with eyes closed but answering questions re her personal comfort. She would not answer any other questions from OT. Vitals assessed and were [facility] Initiated discussion/education on goals of care and discharge planning. Pt answered to indicate she heard OT education but declined participating in discussion at this time. OT will f/u as available to make up time. Family meeting may be helpful to establish realistic and pt centered goals of care.   Therapy Documentation Precautions:  Precautions Precautions: Fall Precaution Comments: R hemi Restrictions Weight Bearing Restrictions: No General: General OT Amount of Missed Time: 50 Minutes  Therapy/Group: Individual Therapy  Curtis Sites 04/10/2022, 12:18 PM

## 2022-04-10 NOTE — Progress Notes (Signed)
Speech Language Pathology Daily Session Note  Patient Details  Name: [name] MRN: [id number] Date of Birth: 02-Jul-1923  Today's Date: 04/10/2022 SLP Individual Time: 0830-0850 SLP Individual Time Calculation (min): 20 min and Today's Date: 04/10/2022 SLP Missed Time: 40 Minutes Missed Time Reason: Patient fatigue  Short Term Goals: Week 2: SLP Short Term Goal 1 (Week 2): Pt will demonstrate 90% speech intelligibility sentence level with Mod I for over articulation and rate SLP Short Term Goal 2 (Week 2): Pt will demonstrate word finding skills in higher level task (ex: divergent/convergent naming and abstract topics) with Supervision A semantic cues. SLP Short Term Goal 3 (Week 2): Pt will demonstrate mildly complex problem solving skills with supervision A verbal cues. SLP Short Term Goal 4 (Week 2): Pt will demonstrate recall of novel, daily information with Supevision A verbal cues for use of external aids.  Skilled Therapeutic Interventions: Skilled treatment session focused on cognitive goals. Upon arrival, patient was asleep in bed and had not eaten her breakfast. SLP facilitated session by providing environmental modifications and overall Max A multimodal cues for arousal. Patient would open her eyes for ~10-20 seconds and then would fall back asleep. Patient answered basic questions intermittently with vocalizations rather than verbalizations but denied pain or feeling unwell. Nursing aware and vitals taken, all WFL. Patient required Max A for bed mobility while repositioning with minimal improvements in arousal. Despite Max A multimodal cues, patient unable to maintain arousal to effectively participate. Therefore, session ended early. SLP will re-attempt as able. Patient left upright in bed with alarm on, lights on, all needs within reach. Continue with current plan of care.      Pain No/Denies Pain   Therapy/Group: Individual Therapy  Shevonne Wolf 04/10/2022, 12:06 PM

## 2022-04-11 MED ORDER — HYDRALAZINE HCL 25 MG PO TABS
25.0000 mg | ORAL_TABLET | Freq: Three times a day (TID) | ORAL | Status: DC
  Administered 2022-04-11 – 2022-04-23 (×35): 25 mg via ORAL
  Filled 2022-04-11 (×36): qty 1

## 2022-04-11 NOTE — Progress Notes (Signed)
Speech Language Pathology Daily Session Note  Patient Details  Name: [name] MRN: [id number] Date of Birth: 10/09/1923  Today's Date: 04/11/2022 SLP Individual Time: 1030-1100 SLP Individual Time Calculation (min): 30 min  Short Term Goals: Week 2: SLP Short Term Goal 1 (Week 2): Pt will demonstrate 90% speech intelligibility sentence level with Mod I for over articulation and rate SLP Short Term Goal 2 (Week 2): Pt will demonstrate word finding skills in higher level task (ex: divergent/convergent naming and abstract topics) with Supervision A semantic cues. SLP Short Term Goal 3 (Week 2): Pt will demonstrate mildly complex problem solving skills with supervision A verbal cues. SLP Short Term Goal 4 (Week 2): Pt will demonstrate recall of novel, daily information with Supevision A verbal cues for use of external aids.  Skilled Therapeutic Interventions: Skilled ST treatment focused on cognitive-communication goals. Pt was alert and oriented x4. Reported increased sleepiness today but this didn't appear to interfere with participation. Patient completed divergent naming and abstract reasoning tasks with sup A verbal cues. Pt required intermittent verbal redirection and semantic cues for working memory during task. Patient was left in recliner with alarm activated and immediate needs within reach at end of session. Continue per current plan of care.      Pain Pain Assessment Pain Scale: 0-10 Pain Score: 0-No pain  Therapy/Group: Individual Therapy  Braelin Costlow T [place] Sorn 04/11/2022, 11:03 AM

## 2022-04-11 NOTE — Progress Notes (Signed)
Occupational Therapy Session Note  Patient Details  Name: [name] MRN: [id number] Date of Birth: 1923-04-18  Today's Date: 04/11/2022 OT Individual Time: 8891-6945 OT Individual Time Calculation (min): 58 min   Today's Date: 04/11/2022 OT Individual Time: 1300-1345 OT Individual Time Calculation (min): 45 min   Short Term Goals: Week 1:  OT Short Term Goal 1 (Week 1): Pt will don a shirt with no more than min cueing for hemi technique and min A OT Short Term Goal 1 - Progress (Week 1): Progressing toward goal OT Short Term Goal 2 (Week 1): Pt will don pants with mod A OT Short Term Goal 2 - Progress (Week 1): Progressing toward goal OT Short Term Goal 3 (Week 1): Pt will maintain sitting balance EOB during ADLs with no more than CGA OT Short Term Goal 3 - Progress (Week 1): Progressing toward goal OT Short Term Goal 4 (Week 1): Pt will complete stand pivot transfer with max of 1 OT Short Term Goal 4 - Progress (Week 1): Met  Skilled Therapeutic Interventions/Progress Updates:    Session 1: Pt received laying supine in bed. Pleasant and agreeable to OT tx. Pt requested to get dressed and freshen up for the day and did not mention any pain. Pt expressed the need to use the restroom. Assessed BP seated EOB. BP 88/52 and pulse 61. Donned ted hose to BLE for BP support. BP reassessed sitting EOB at 111/65. Pt stood EOB > steady with Min A for verbal/physical cueing of RUE. Pt did not void on toilet. Required Mod/Max A to pull brief/pants up. Completed bath at the sink Min A needing the most assistance to clean her bottom. Completed grooming/oral care with Min A overall. Pt required Mod A for donning UB and Max A for LB. Pt did not utilize hemi technique and showed difficulty orienting and incorporating her RUE. Pt required cueing throughout session to incorporate RUE into activities and initiate purposeful movement. Motor apraxia and inattention to tasks still present. Cued pt to look at  activity to increase awareness. Pt left sitting in recliner with chair alarm on, call bell in reach, and all needs met.  Session 2: Pt received seated in recliner with son present in the room. Son mentioned that his mother seemed to be doing better from yesterday. Pt reported that she did not feel good but that she was willing to participate in OT session. No report of pain. Pt stood from the recliner to the steady with Mod A. Pt required physical and verbal cueing for hand placement and initiation of standing task. Pt transported to gym via w/c and engaged in BITS visual scanning activity while seated. Facilitated pt utilizing her RUE to tap the circles on the screen. Pt required Mod facilitation of the RUE and instructed to isolate pointer finger. However, pt did not show carryover. Worked on [facility] of R hand and encouraged bimanual integration of both hands to flatten and roll the thera-putty. Used HOH to assist R hand in rolling. Pt pinched the rolled log with her index and pointer finger. Stacked and dismantled 8 leggos using the LUE to stabilize the structure and the RUE to manipulate and stack the leggos. During tx, pt seemed confused as to why her right hand was not doing as good as her left hand compared to yesterday. Pt left seated in w/c with chair alarm on, call bell in reach, and all needs met.    Therapy Documentation Precautions:  Precautions Precautions: Fall Precaution  Comments: R hemi Restrictions Weight Bearing Restrictions: No  Therapy/Group: Individual Therapy  [name] 04/11/2022, 12:12 PM

## 2022-04-11 NOTE — Progress Notes (Signed)
Physical Therapy Session Note  Patient Details  Name: [name] MRN: [id number] Date of Birth: [birth year]  Today's Date: 04/11/2022 PT Individual Time: 1420-1530 PT Individual Time Calculation (min): 70 min   Short Term Goals: Week 1:  PT Short Term Goal 1 (Week 1): pt will roll side to side w/ CGA. PT Short Term Goal 2 (Week 1): Pt will transfer sup/sidelying to sit w/ min A PT Short Term Goal 3 (Week 1): Pt will transfer sit to stand w/ mod A PT Short Term Goal 4 (Week 1): Pt will perform SPT w/ mod A  Skilled Therapeutic Interventions/Progress Updates: Pt presented in w/c agreeable to therapy. Pt denies pain but states very fatigued. Pt transported to rehab gym and participated in Sit to stand in parallel bars. Pt required modA on first stand and minA on second stand. Pt was able to perform weight shifting L/R as well as perform hip flexion with RLE x 3 before fatigue (and ?anxiousness) required pt to sit. Pt then transported to day room and participated in Cybex Kinetron at 50cm/sec for forced use RLE NMR and reciprocal activity of BLE. Pt also attempted to engage in w/c propulsion via hemi technique. Pt attempted ~80f requiring modA due to inconsistent sequencing particularly with LE. Pt was able to maintain straighter trajectory in final few (~133f however pt with difficulty maintaining foot contact with floor and would be more beneficial to attempt propulsion with shoes on. Pt then transported back to room with pt indicating possibly need for urinary void. In room performed Stedy transfer to toilet with pt requiring modA for stand and total A for clothing management. Pt noted to be incontinent of bladder as well as active void upon sitting. Once completed pt required maxA for stand from toilet and PTA provided total A for peri-care and donning new brief. Pt then transported to bed and requesting new pants to be donned. PTA threaded pants total A and pt was able to perform stand from  elevated bed with modA to pull pants over hips. Performed sit to supine with minA and multimodal cues for elevating RLE to place on bed. Once in supine pt placed BLE in hooklying and [facility] modA on first scoot and maxA on second scoot. Pt repositioned to comfort and left in bed with bed alarm on, call bell within reach and needs met with family present.      Therapy Documentation Precautions:  Precautions Precautions: Fall Precaution Comments: R hemi Restrictions Weight Bearing Restrictions: No General:   Vital Signs: Therapy Vitals Temp: 97.7 F (36.5 C) Pulse Rate: 67 Resp: 18 BP: 115/70 Patient Position (if appropriate): Lying Oxygen Therapy SpO2: 97 % O2 Device: Room Air Pain:   Mobility:   Locomotion :    Trunk/Postural Assessment :    Balance:   Exercises:   Other Treatments:      Therapy/Group: Individual Therapy  Nicolet Griffy 04/11/2022, 4:10 PM

## 2022-04-11 NOTE — Progress Notes (Signed)
PROGRESS NOTE   Subjective/Complaints:  Pt up with NT. Eating breakfast. No complaints this morning. Not on oxygen  ROS: Patient denies fever, rash, sore throat, blurred vision, dizziness, nausea, vomiting, diarrhea, cough, shortness of breath or chest pain, joint or back/neck pain, headache, or mood change.     Objective:   No results found.  No results for input(s): WBC, HGB, HCT, PLT in the last 72 hours.   No results for input(s): NA, K, CL, CO2, GLUCOSE, BUN, CREATININE, CALCIUM in the last 72 hours.    Intake/Output Summary (Last 24 hours) at 04/11/2022 1005 Last data filed at 04/11/2022 0826 Gross per 24 hour  Intake 597 ml  Output --  Net 597 ml        Physical Exam: Vital Signs Blood pressure (!) 128/54, pulse 71, temperature 98.2 F (36.8 C), temperature source Oral, resp. rate 19, height '5\' 6"'$  (1.676 m), weight 70.3 kg, SpO2 96 %.  Constitutional: No distress . Vital signs reviewed. HEENT: NCAT, EOMI, oral membranes moist Neck: supple Cardiovascular: RRR without murmur. No JVD    Respiratory/Chest: CTA Bilaterally without wheezes or rales. Normal effort    GI/Abdomen: BS +, non-tender, non-distended Ext: no clubbing, cyanosis, or edema Psych: pleasant, a little anxious Skin: Clean and intact without signs of breakdown Neuro:  alert oriented to person, reason she's here.  Follows commands. Language improving.. Minimal right central 7.  RUE 3/5 at least. RLE 3-/5. Sensation intact for pain and LT    Musculoskeletal: Able to perform AROM and PROM of limbs and trunk without pain.    Assessment/Plan: 1. Functional deficits which require 3+ hours per day of interdisciplinary therapy in a comprehensive inpatient rehab setting. Physiatrist is providing close team supervision and 24 hour management of active medical problems listed below. Physiatrist and rehab team continue to assess barriers to  discharge/monitor patient progress toward functional and medical goals  Care Tool:  Bathing    Body parts bathed by patient: Right arm, Left arm, Chest, Abdomen, Right upper leg, Left upper leg, Face   Body parts bathed by helper: Front perineal area, Buttocks, Right lower leg, Left lower leg     Bathing assist Assist Level: Moderate Assistance - Patient 50 - 74%     Upper Body Dressing/Undressing Upper body dressing   What is the patient wearing?: Pull over shirt    Upper body assist Assist Level: Maximal Assistance - Patient 25 - 49%    Lower Body Dressing/Undressing Lower body dressing      What is the patient wearing?: Pants     Lower body assist Assist for lower body dressing: 2 Helpers     Toileting Toileting    Toileting assist Assist for toileting: 2 Helpers     Transfers Chair/bed transfer  Transfers assist     Chair/bed transfer assist level: Minimal Assistance - Patient > 75%     Locomotion Ambulation   Ambulation assist   Ambulation activity did not occur: Safety/medical concerns  Assist level: 2 helpers Assistive device: No Device Max distance: 4   Walk 10 feet activity   Assist  Walk 10 feet activity did not occur: Safety/medical concerns  Walk 50 feet activity   Assist Walk 50 feet with 2 turns activity did not occur: Safety/medical concerns         Walk 150 feet activity   Assist Walk 150 feet activity did not occur: Safety/medical concerns         Walk 10 feet on uneven surface  activity   Assist Walk 10 feet on uneven surfaces activity did not occur: Safety/medical concerns         Wheelchair     Assist Is the patient using a wheelchair?: Yes Type of Wheelchair: Manual    Wheelchair assist level: Minimal Assistance - Patient > 75% Max wheelchair distance: 20    Wheelchair 50 feet with 2 turns activity    Assist        Assist Level: Maximal Assistance - Patient 25 - 49% (Per PT  documentation of 20 feet)   Wheelchair 150 feet activity     Assist      Assist Level: Total Assistance - Patient < 25% (Per PT documentation of 20 feet)   Blood pressure (!) 128/54, pulse 71, temperature 98.2 F (36.8 C), temperature source Oral, resp. rate 19, height '5\' 6"'$  (1.676 m), weight 70.3 kg, SpO2 96 %.  Medical Problem List and Plan: 1. Functional deficits secondary to left thalamic intracranial hemorrhage             -patient may shower             -ELOS/Goals: 14-16 days, supervision to min assist  -Continue CIR therapies including PT, OT, and SLP  -Have reduced intensity to 15/7  -still having intermittent lethargy. I suspect this is sleep related, reflection of age as well. Seemed to perk up later in  morning 5/31  -CT/CTA without new disease and demonstrate expected evolution  -continue to observe clinically, avoid neurosedating meds.   -TSH ok, free Tr actually a little high  -ABG demonstrates some hypoxia---was no different on repeat yesterday. O2 sat 96% today on RA 2.  Antithrombotics: -DVT/anticoagulation:  Pharmaceutical: Heparin             -antiplatelet therapy: none 3. Pain Management: Tylenol as needed. Neurontin 300 mg q HS--reduced to '200mg'$  4. Mood: team providing ego support             -antipsychotic agents: n/a  -family is providing  emotional support as well 5. Neuropsych: This patient is not capable of making decisions on her own behalf. 6. Skin/Wound Care: Routine skin care checks 7. Fluids/Electrolytes/Nutrition:               --dysphagia 3 diet with thin liquids  -seems to be tolerating thus far, intake reasonable  -albumin low, changed to Boost Breeze per pt preference 8. Hypertension: continue diltiazem, hydralazine, nadolol. Home losartan held  -6/1 bp soft. Hold nadolol prn for hypotension   -will decrease hydralazine to '25mg'$   9: Hyperlipidemia: continue atorvastatin 10: GI prophylaxis: continue pantoprazole 11: pAF: continue  diltiazem. No AC until resolution of ICH, outpt follow-up 12: Hypothyroidism: continue Synthroid 13: Urinary retention: continue Flomax             - pt has had a few I/O caths--continue flomax for now 14: UTI:  Keflex completed  -  repeat UA neg, UCX neg also 15: AKI: BUN and Cr   improved             -encouraging fluids.  16: Leukocytosis: wbc's normal.   17: Anemia, chronic: no evidence of  acute blood loss -hgb 8.9--stable 5/29  18: Polymyalgia rheumatica: prednisone 2 mg with lunch 19. Dysphagia:               -advanced to reg/thins per SLP 20. Dry eye, blurry vision   Pt has macular degeneration of the right eye, uses preservision at home. -Placed order for Prosight (the pharmacy equivalent to Preservision and artifical tears.     - Conjunctiva appear more clear    Continue use of artifical tears and Prosight.    LOS: 9 days A FACE TO FACE EVALUATION WAS PERFORMED  Meredith Staggers 04/11/2022, 10:05 AM

## 2022-04-12 NOTE — Progress Notes (Signed)
Occupational Therapy Session Note  Patient Details  Name: [name] MRN: [id number] Date of Birth: 1923/07/29  Today's Date: 04/12/2022 OT Individual Time: 6568-1275 OT Individual Time Calculation (min): 86 min    Short Term Goals: Week 2:  OT Short Term Goal 1 (Week 2): Pt will don a shirt with no more than min cueing for hemi technique and min A OT Short Term Goal 2 (Week 2): Pt will don pants with mod A OT Short Term Goal 3 (Week 2): Pt will maintain sitting balance EOB during ADLs with no more than CGA OT Short Term Goal 4 (Week 2): Patient will complete sit<>stand at the sink with mod A of 1 during BADL task without Stedy  Skilled Therapeutic Interventions/Progress Updates:    Pt greeted semi-reclined in bed reporting she had not eaten breakfast yet. Pt agreeable to sit up in recliner to eat. Pt completed bed mobility with increased time and mod A due to motor planning deficits and R hemiplegia. Mod A sit<>stand from EOB, then max A to pivot to recliner. Pt needed set-up A for breakfast with limited ROM of L shoulder due to previous shoulder injury, but now exacerbated. Pt was able to bring food items to mouth and manipulate utensils with L hand. Worked on using R UE as a stabilizer during self feeding tasks. Min cues to check for pocketing on R side. Pt requested to wash up and change clothes. Pt transferred to wc with max A stand-pivot and brought to the sink in wc. Worked on integrating R UE into bathing tasks for neuro re-ed with focus on increased smoothness and accuracy. Pt was able to remove deodorant lid with R hand today!  Also addressed sit<>stands at the sink with mod A and mirror feedback for visual cues of midline position. Pt tolerated standing with mod A for balance, while reaching to wash peri-area and buttocks. R hand as stabilizer at the sink for proprioceptive feedback. Pt with minimal recall of hemi-dressing techniques requiring moderate cues to recall. OT assisted with  donning TED hose for BP support and max A to don shoes. Pt left seated in wc at end of session awaiting next therapy session.    Therapy Documentation Precautions:  Precautions Precautions: Fall Precaution Comments: R hemi Restrictions Weight Bearing Restrictions: No Pain:  Pt reports pain in L shoulder with movement. Rest and repositioned for comfort.    Therapy/Group: Individual Therapy  Valma Cava 04/12/2022, 9:36 AM

## 2022-04-12 NOTE — Progress Notes (Signed)
PROGRESS NOTE   Subjective/Complaints:  Pt says she didn't sleep well. Getting up with therapy however and very alert. She acknowledges that her right arm is moving better. Still frustrated that her right knee buckles.   ROS: Patient denies fever, rash, sore throat, blurred vision, dizziness, nausea, vomiting, diarrhea, cough, shortness of breath or chest pain,  back/neck pain, headache, or mood change.     Objective:   No results found.  No results for input(s): WBC, HGB, HCT, PLT in the last 72 hours.   No results for input(s): NA, K, CL, CO2, GLUCOSE, BUN, CREATININE, CALCIUM in the last 72 hours.    Intake/Output Summary (Last 24 hours) at 04/12/2022 1024 Last data filed at 04/12/2022 0842 Gross per 24 hour  Intake 840 ml  Output --  Net 840 ml        Physical Exam: Vital Signs Blood pressure 132/65, pulse 65, temperature 97.8 F (36.6 C), temperature source Oral, resp. rate 16, height '5\' 6"'$  (1.676 m), weight 70.3 kg, SpO2 99 %.  Constitutional: No distress . Vital signs reviewed. HEENT: NCAT, EOMI, oral membranes moist Neck: supple Cardiovascular: RRR without murmur. No JVD    Respiratory/Chest: CTA Bilaterally without wheezes or rales. Normal effort    GI/Abdomen: BS +, non-tender, non-distended Ext: no clubbing, cyanosis, or edema Psych: a little flat, anxious at times Skin: Clean and intact without signs of breakdown Neuro:  alert oriented to person, reason she's here.  Follows commands. Language improving.. Minimal right central 7.  RUE 3-4/5 at least. RLE 3 to 3+/5. Sensation intact for pain and LT    Musculoskeletal: Able to perform AROM and PROM of limbs and trunk without pain.    Assessment/Plan: 1. Functional deficits which require 3+ hours per day of interdisciplinary therapy in a comprehensive inpatient rehab setting. Physiatrist is providing close team supervision and 24 hour management of  active medical problems listed below. Physiatrist and rehab team continue to assess barriers to discharge/monitor patient progress toward functional and medical goals  Care Tool:  Bathing    Body parts bathed by patient: Right arm, Left arm, Chest, Abdomen, Right upper leg, Left upper leg, Face, Buttocks, Front perineal area   Body parts bathed by helper: Right lower leg, Face     Bathing assist Assist Level: Moderate Assistance - Patient 50 - 74%     Upper Body Dressing/Undressing Upper body dressing   What is the patient wearing?: Pull over shirt    Upper body assist Assist Level: Moderate Assistance - Patient 50 - 74%    Lower Body Dressing/Undressing Lower body dressing      What is the patient wearing?: Pants     Lower body assist Assist for lower body dressing: Maximal Assistance - Patient 25 - 49%     Toileting Toileting    Toileting assist Assist for toileting: Maximal Assistance - Patient 25 - 49%     Transfers Chair/bed transfer  Transfers assist     Chair/bed transfer assist level: Maximal Assistance - Patient 25 - 49%     Locomotion Ambulation   Ambulation assist   Ambulation activity did not occur: Safety/medical concerns  Assist level: 2  helpers Assistive device: No Device Max distance: 4   Walk 10 feet activity   Assist  Walk 10 feet activity did not occur: Safety/medical concerns        Walk 50 feet activity   Assist Walk 50 feet with 2 turns activity did not occur: Safety/medical concerns         Walk 150 feet activity   Assist Walk 150 feet activity did not occur: Safety/medical concerns         Walk 10 feet on uneven surface  activity   Assist Walk 10 feet on uneven surfaces activity did not occur: Safety/medical concerns         Wheelchair     Assist Is the patient using a wheelchair?: Yes Type of Wheelchair: Manual    Wheelchair assist level: Minimal Assistance - Patient > 75% Max wheelchair  distance: 20    Wheelchair 50 feet with 2 turns activity    Assist        Assist Level: Maximal Assistance - Patient 25 - 49% (Per PT documentation of 20 feet)   Wheelchair 150 feet activity     Assist      Assist Level: Total Assistance - Patient < 25% (Per PT documentation of 20 feet)   Blood pressure 132/65, pulse 65, temperature 97.8 F (36.6 C), temperature source Oral, resp. rate 16, height '5\' 6"'$  (1.676 m), weight 70.3 kg, SpO2 99 %.  Medical Problem List and Plan: 1. Functional deficits secondary to left thalamic intracranial hemorrhage             -patient may shower             -ELOS/Goals: 14-16 days, supervision to min assist  -Continue CIR therapies including PT, OT, and SLP  -Have reduced intensity to 15/7  -still having intermittent lethargy.  Episodic, ?sleep/age related.  -CT/CTA without new disease and demonstrate expected evolution  -continue to observe clinically, avoid neurosedating meds.   -TSH ok, free T4 actually a little high  -ABG with mild hypoxia during one of these "episodes"---off oxgen this morning with sats in 90's 2.  Antithrombotics: -DVT/anticoagulation:  Pharmaceutical: Heparin             -antiplatelet therapy: none 3. Pain Management: Tylenol as needed. Neurontin 300 mg q HS--reduced to '200mg'$  4. Mood: team providing ego support             -antipsychotic agents: n/a  -family is providing  emotional support as well 5. Neuropsych: This patient is not capable of making decisions on her own behalf. 6. Skin/Wound Care: Routine skin care checks 7. Fluids/Electrolytes/Nutrition:               --encouraging PO  - intake reasonable  -albumin low, changed to Boost Breeze per pt preference  -recheck labs monday 8. Hypertension: continue diltiazem, hydralazine, nadolol. Home losartan held  -6/2 bp better. Holding nadolol prn for hypotension   -have decreased hydralazine to '25mg'$   9: Hyperlipidemia: continue atorvastatin 10: GI  prophylaxis: continue pantoprazole 11: pAF: continue diltiazem. No AC until resolution of ICH, outpt follow-up 12: Hypothyroidism: continue Synthroid 13: Urinary retention: continue Flomax             - 6/2 no I/O caths since 5/31 14: UTI:  Keflex completed  -  repeat UA neg, UCX neg also 15: AKI: BUN and Cr   improved             -encouraging fluids.  16: Leukocytosis: wbc's  normal.   17: Anemia, chronic: no evidence of acute blood loss -hgb 8.9--stable 5/29 --> recheck monday 18: Polymyalgia rheumatica: prednisone 2 mg with lunch 19. Dysphagia:               -advanced to reg/thins per SLP 20. Dry eye, blurry vision   Pt has macular degeneration of the right eye, uses preservision at home. -Placed order for Prosight (the pharmacy equivalent to Preservision and artifical tears.     - Conjunctiva appear more clear    Continue use of artifical tears and Prosight.    LOS: 10 days A FACE TO FACE EVALUATION WAS PERFORMED  Meredith Staggers 04/12/2022, 10:24 AM

## 2022-04-12 NOTE — Progress Notes (Signed)
Speech Language Pathology Daily Session Note  Patient Details  Name: KATHEY SIMER MRN: [id number] Date of Birth: October 08, 1923  Today's Date: 04/12/2022 SLP Individual Time: 1300-1400 SLP Individual Time Calculation (min): 60 min  Short Term Goals: Week 2: SLP Short Term Goal 1 (Week 2): Pt will demonstrate 90% speech intelligibility sentence level with Mod I for over articulation and rate SLP Short Term Goal 2 (Week 2): Pt will demonstrate word finding skills in higher level task (ex: divergent/convergent naming and abstract topics) with Supervision A semantic cues. SLP Short Term Goal 3 (Week 2): Pt will demonstrate mildly complex problem solving skills with supervision A verbal cues. SLP Short Term Goal 4 (Week 2): Pt will demonstrate recall of novel, daily information with Supevision A verbal cues for use of external aids.  Skilled Therapeutic Interventions: Pt seen for skilled ST with focus on cognitive goals, pt upright in wheelchair, very alert and agreeable to therapeutic tasks. SLP facilitating simple money calculation/counting activity by providing overall mod A cues for organization and problem solving. Pt able to complete task with 40% accuracy, verbal cues required to increase motivation at times. SLP providing pt with current medication list which patient was able to ID baseline vs new medications with min A cues. Pt reports daughter manages medication at home but pt generally aware of what she takes and why. Pt with only occasional episodes of higher level word finding difficulties during treatment session, expressive language skills improved compared with tx session earlier in the week. Pt left in recliner with alarm belt activated and all needs within reach. Cont ST POC.   Pain Pain Assessment Pain Scale: 0-10 Pain Score: 0-No pain  Therapy/Group: Individual Therapy  Dewaine Conger 04/12/2022, 1:58 PM

## 2022-04-12 NOTE — Consult Note (Signed)
Neuropsychological Consultation   Patient:   [name]   DOB:   [birth year]  MR Number:  [id number]  Location:  [facility] [id number] [place] [id number] Dept: [place]: [phone]           Date of Service:   04/12/2022  Start Time:   10 AM End Time:   11 AM  Provider/Observer:  Ilean Skill, Psy.D.       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  Patient admitted to ICU for medical stability.  Expressive aphasia due to motor involvement.  Patient did have some panic events and anxiety during ICU care.  Patient admitted to comprehensive inpatient rehabilitation unit due to functional decline with motor deficits secondary to left basal ganglia hemorrhage.  Reason for Service:  Patient was referred for neuropsychological consultation due to coping and adjustment issues with planned extensive hospital stay due to functional and motor deficits with history of anxiety.  Below is the HPI for the current admission.  HPI: [name] is a 88 year old female who presented to the emergency department via EMS with right-sided weakness, aphasia, speech apraxia and slurred speech.  Code stroke was activated.  Imaging confirmed left basal ganglia intracranial hemorrhage with trace intraventricular hemorrhage and was in hypertensive emergency on arrival.  She has a history of atrial fibrillation on apixaban therapy.  This was reversed with Andexxa.  She was admitted to the ICU and given labetalol and Cleviprex to keep systolic blood pressure within 130-150.  After swallow evaluation on 5/15, she was started on oral antihypertensives.  She was transferred out of the ICU on 5/16.  Her aphasia was improving but did fluctuate with fatigue.  Serial  CTs performed.  She remained hemodynamically stable.  She exhibited increased confusion on 5/19 and urinalysis and culture were obtained.  Ceftriaxone initiated.  She developed urinary retention and Foley was placed, started on tamsulosin and Foley discontinued.  Culture was positive for Proteus and antibiotics were adjusted to Keflex.  Monitored on telemetry and her heart rate was controlled with Cardizem and nadolol.  Neurology recommends resumption of anticoagulation after resolution of her IPH and follow-up as outpatient. The patient requires inpatient medicine and rehabilitation evaluations and services for ongoing dysfunction secondary to left thalamic intracranial hemorrhage.   Spoke with daughter and son as patient being transferred. She has been more somnolent and confused today. She was more alert and engaged yesterday. There was possibility of panic yesterday and daughter believes the chest discomfort could have been acid reflux as she has GERD. Will discontinue Xanax prn.  Current Status:  Patient was awake and alert sitting up in her bed in the upright position.  Patient's son was also present in the room.  She was oriented with good mental status.  Patient displayed clear motor deficits for right arm and right leg with some motor movement in her right extremities.  Patient aware of motor deficits and showed no indication of neglect syndrome.  There were some articulation issues but receptive and expressive language appeared to be intact and aphasic issues appear to be primary motor involvement.  Behavioral Observation: [name]  presents as a 88 y.o.-year-old Right handed Caucasian Female who appeared her stated age. her dress was Appropriate and she  was Well Groomed and her manners were Appropriate to the situation.  her participation was indicative of Appropriate and Attentive behaviors.  There were physical disabilities noted.  she displayed an appropriate level of cooperation and  motivation.     Interactions:    Active Appropriate  Attention:   within normal limits and attention span and concentration were age appropriate  Memory:   within normal limits; recent and remote memory intact  Visuo-spatial:  not examined  Speech (Volume):  normal  Speech:   normal; slurred  Thought Process:  Coherent and Relevant  Though Content:  WNL; not suicidal and not homicidal  Orientation:   person, place, time/date, and situation  Judgment:   Fair  Planning:   Fair  Affect:    Anxious  Mood:    Anxious  Insight:   Good  Intelligence:   normal  Medical History:   Past Medical History:  Diagnosis Date   Closed fracture of left distal radius 12/29/2019   DVT (deep venous thrombosis) ([place]) 09/2015   RLE   Dysrhythmia    Afib   GERD (gastroesophageal reflux disease)    Hip fracture ([place]) 12/02/2020   Hypertension    Hypothyroidism    Melanoma ([place]) 06/16/2017   Facial melanoma, removed by Dr. Harvel Quale   Peripheral vascular disease [facility])    peripheral neuropathy         Patient Active Problem List   Diagnosis Date Noted   ICH (intracerebral hemorrhage) ([place]) 03/25/2022   Hypertensive emergency    Osteoporosis 10/24/2021   Chronic anticoagulation 01/17/2021   Parastomal hernia s/p colostomy takedown & repair 01/17/2021 01/17/2021   Polymyalgia rheumatica ([place]) 01/11/2021   (HFpEF) heart failure with preserved ejection fraction ([place]) 10/27/2020   Injury of extensor or abductor muscle, fascia, or tendon of thumb at forearm level 08/03/2020   Orthostatic hypotension 05/24/2020   Acute blood loss anemia 05/19/2020   Traumatic hematoma of left hip 05/19/2020   Current chronic use of systemic steroids 05/19/2020   Advanced age 28/12/2019   Secondary hypercoagulable state ([place]) 03/08/2020   Hypokalemia 02/15/2020   History of diverticulitis of colon with perforation 01/26/2020   Anemia    Debility 01/14/2020   Physical debility 01/14/2020   Chronic  atrial fibrillation ([facility])    Recurrent falls 12/29/2019   Positive ANA (antinuclear antibody) 12/29/2019   Osteoarthritis 11/13/2017   Hypertriglyceridemia 01/29/2017   Excessive gas 01/09/2017   Physical exam 07/31/2016   Elevated lipase 03/06/2016   Chest pain 03/06/2016   Hypothyroidism 01/26/2016   HTN (hypertension) 01/26/2016   Left leg numbness 10/06/2012   Psychiatric History:  No prior psychiatric history  Family Med/Psych History:  Family History  Problem Relation Age of Onset   Cancer Mother        BREAST   COPD Father    Emphysema Father    Cancer Daughter        breast    Risk of Suicide/Violence: virtually non-existent   Impression/DX:  [name] is a 88 year old female had cerebrovascular accident with hemorrhage in the left basal ganglia.  Patient admitted to ICU for medical stability.  Expressive aphasia due to motor involvement.  Patient did have some panic events and anxiety during ICU care.  Patient admitted to comprehensive inpatient rehabilitation unit due to functional decline with motor deficits secondary to left basal ganglia hemorrhage.  Patient was awake and alert sitting up in her bed in the upright position.  Patient's son was also present  in the room.  She was oriented with good mental status.  Patient displayed clear motor deficits for right arm and right leg with some motor movement in her right extremities.  Patient aware of motor deficits and showed no indication of neglect syndrome.  There were some articulation issues but receptive and expressive language appeared to be intact and aphasic issues appear to be primary motor involvement.  Disposition/Plan:  Worked with patient regarding understanding rehabilitative efforts and expectations going forward as far as timelines and types of recoveries and degree of recovery potential.  Answered pertinent questions from the patient as well as her son.  Had been essentially independent with family  living very nearby.  The patient's goal is to be able to return to her level of independence that she had prior.  It is very likely that she will need more assistance at discharge but the patient is showing improvement with motor functioning over the past several days.  Patient denied significant anxiety but it was clear she is somewhat anxious about the effects of her hemorrhagic stroke and what it would mean for her going forward.  Diagnosis:    Nontraumatic subcortical hemorrhage of left cerebral hemisphere [facility]) - Plan: Ambulatory referral to Neurology         Electronically Signed   _______________________ Ilean Skill, Psy.D. Clinical Neuropsychologist

## 2022-04-12 NOTE — Progress Notes (Signed)
Physical Therapy Session Note  Patient Details  Name: [name] MRN: [id number] Date of Birth: 01-29-1923  Today's Date: 04/12/2022 PT Individual Time: 0905-1000 PT Individual Time Calculation (min): 55 min   Short Term Goals: Week 1:  PT Short Term Goal 1 (Week 1): pt will roll side to side w/ CGA. PT Short Term Goal 2 (Week 1): Pt will transfer sup/sidelying to sit w/ min A PT Short Term Goal 3 (Week 1): Pt will transfer sit to stand w/ mod A PT Short Term Goal 4 (Week 1): Pt will perform SPT w/ mod A  Skilled Therapeutic Interventions/Progress Updates: Pt presented in w/c agreeable to therapy. Pt denies pain at start of session then indicated increased pain in R shoulder with difficulty going past 90 degrees of flexion. Pt transported to day room and participated in Cybex Kinetron 50cm/sec 1 min x 3 bouts for reciprocal activity and general conditioning. Pt then transported to rehab gym and performed Sit to stand in parallel bars. In standing pt performed lateral weight shifting and pre-gait activities including stepping forward/backward with L then RLE. Pt then performed 6 steps forward with modA. Pt required verbal cues for sequencing, facilitation for weight shifting, and assist for RLE placement as pt tended to adduct heavily. Once completed pt c/o increased fatigue and requested to return to room, with encouragement pt agreeable to perform sitting task. Transported to ortho gym and participated in [place] tracing activity with HOH assist and use of drum stick for RUE forced use. With modA pt was able to complete x 3 shapes using RUE. Pt also participated in w/c mobility ~24f requiring modA fading to minA for sequencing. With shoes pt was able to appropriately strike ground with foot. Pt transported remaining distance back to room and requesting to sit in recliner. Performed Stedy transfer to recliner with pt requiring modA for Sit to stand. Pt positioned in recliner for comfort and left with  belt alarm on, call bell within reach and needs met.      Therapy Documentation Precautions:  Precautions Precautions: Fall Precaution Comments: R hemi Restrictions Weight Bearing Restrictions: No General:   Vital Signs: Therapy Vitals Temp: 98.5 F (36.9 C) Temp Source: Oral Pulse Rate: 62 Resp: 16 BP: (!) 120/59 Patient Position (if appropriate): Sitting Oxygen Therapy SpO2: 97 % O2 Device: Room Air Pain: Pain Assessment Pain Scale: 0-10 Pain Score: 0-No pain Mobility:   Locomotion :    Trunk/Postural Assessment :    Balance:   Exercises:   Other Treatments:      Therapy/Group: Individual Therapy  Tanvi Gatling 04/12/2022, 3:49 PM

## 2022-04-13 DIAGNOSIS — F515 Nightmare disorder: Secondary | ICD-10-CM

## 2022-04-13 DIAGNOSIS — R410 Disorientation, unspecified: Secondary | ICD-10-CM

## 2022-04-13 NOTE — Progress Notes (Addendum)
Occupational Therapy Session Note  Patient Details  Name: [name] MRN: [id number] Date of Birth: May 10, 1923  Today's Date: 04/13/2022 OT Individual Time: 1115-1200 OT Individual Time Calculation (min): 45 min    Short Term Goals: Week 1:  OT Short Term Goal 1 (Week 1): Pt will don a shirt with no more than min cueing for hemi technique and min A OT Short Term Goal 1 - Progress (Week 1): Progressing toward goal OT Short Term Goal 2 (Week 1): Pt will don pants with mod A OT Short Term Goal 2 - Progress (Week 1): Progressing toward goal OT Short Term Goal 3 (Week 1): Pt will maintain sitting balance EOB during ADLs with no more than CGA OT Short Term Goal 3 - Progress (Week 1): Progressing toward goal OT Short Term Goal 4 (Week 1): Pt will complete stand pivot transfer with max of 1 OT Short Term Goal 4 - Progress (Week 1): Met Week 2:  OT Short Term Goal 1 (Week 2): Pt will don a shirt with no more than min cueing for hemi technique and min A OT Short Term Goal 2 (Week 2): Pt will don pants with mod A OT Short Term Goal 3 (Week 2): Pt will maintain sitting balance EOB during ADLs with no more than CGA OT Short Term Goal 4 (Week 2): Patient will complete sit<>stand at the sink with mod A of 1 during BADL task without Stedy  Skilled Therapeutic Interventions/Progress Updates:    Patient seen this OT treatment session in her room, patient indicated that she slept well, however ,she presented with increase confusion inquiring as to what was her location.  I informed the pt of her location and introduced myself, indicating that I was here to work with her, the pt initially  refused OT service, however, I offered to call a family member to aid with her feeling comfortable within her surrounding.  After the patient  was able to speak with her daughter she became more settled.  The pt went on to complete BADL's in bathing at sink level and  dressing.  The pt was able to bathe her UB with MinA ,  she was MaxA with LB task performance.The pt was able to donn her UB clothing items, a bra with ModA, and a over head top with MinA, she was able to donn a brief and pants with Max A, the pt was MaxA for ted hose as well. At the end of the treatment session ,the pt transferred from the w/c to the recliner using a stand pivot transfer while incorporating the  RW for additional balance.  Throughout this OT treatment session, the pt had no c/o pain, her alarm was activated, her bedside table and call light were within reach. and All additional needs were addresses.  The pt's daughter , whom I had spoken with earlier , arrived at the end of the session    Therapy Documentation Precautions:  Precautions Precautions: Fall Precaution Comments: R hemi Restrictions Weight Bearing Restrictions: No   Therapy/Group: Individual Therapy  Yvonne Kendall 04/13/2022, 3:41 PM

## 2022-04-13 NOTE — Progress Notes (Signed)
Physical Therapy Session Note  Patient Details  Name: [name] MRN: [id number] Date of Birth: [birth year]  Today's Date: 04/13/2022 PT Individual Time: 1415-1500 PT Individual Time Calculation (min): 45 min   Short Term Goals: Week 2:     Skilled Therapeutic Interventions/Progress Updates: Pt presents sitting in recliner and agreeable to therapy.  Pt transferred sit to stand w/ Mod A and verbal cues for initiation, including scooting, foot placement and forward lean.  Pt required max A for SPT w/ facilitation for weight shift to advance R foot.  Pt continues to advance LLE w/o R and stands w/ increased BOS.  Pt wheeled to main gym for energy conservation.  Pt performed multiple sit to stand transfers to RW w/ Mod A and step by step sequencing especially for forward lean.  Pt performed standing w/ and w/o UE support, partial squats and marching w/ emphasis on R LE placement to original spot.  Pt requires verbal cues for forward lean to transfer stand to sit.  Pt returned to room 2/2 request for use of BR.  Pt performed SPT w/c to toilet w/ max A and poor placement of R foot, so sat on toilet, then re-positioned feet for improved balance w/ stand to sit and dtr assisted w/ clothing management.  Pt remained sitting on toilet w/ dtr present.  NT notified of pt using BR and will assist w/ return to recliner w/ Stedy.     Therapy Documentation Precautions:  Precautions Precautions: Fall Precaution Comments: R hemi Restrictions Weight Bearing Restrictions: No General:   Vital Signs: Therapy Vitals Temp: 97.7 F (36.5 C) Pulse Rate: (!) 59 Resp: 16 BP: 112/60 Patient Position (if appropriate): Sitting Oxygen Therapy SpO2: 97 % O2 Device: Room Air Pain:no c/o pain.   Mobility:     Therapy/Group: Individual Therapy  [name] 04/13/2022, 3:03 PM

## 2022-04-13 NOTE — Progress Notes (Signed)
Speech Language Pathology Daily Session Note  Patient Details  Name: [name] MRN: [id number] Date of Birth: 1923/04/23  Today's Date: 04/13/2022 SLP Individual Time: 2800-3491 SLP Individual Time Calculation (min): 43 min  Short Term Goals: Week 2: SLP Short Term Goal 1 (Week 2): Pt will demonstrate 90% speech intelligibility sentence level with Mod I for over articulation and rate SLP Short Term Goal 2 (Week 2): Pt will demonstrate word finding skills in higher level task (ex: divergent/convergent naming and abstract topics) with Supervision A semantic cues. SLP Short Term Goal 3 (Week 2): Pt will demonstrate mildly complex problem solving skills with supervision A verbal cues. SLP Short Term Goal 4 (Week 2): Pt will demonstrate recall of novel, daily information with Supevision A verbal cues for use of external aids.  Skilled Therapeutic Interventions:Skilled ST services focused on education and cognitive skills. [name] was present and requested medication list since she manages medication at home. SLP provided list and she confirmed only minimal changes to medication. Pt was unable to recall medication functions at this time, pt expressed due to fatigue. SLP facilitated mildly complex problem solving and recall skills in organization worksheet (organizing kitchen shelves.) Pt dictated response to word problems, requiring mod A verbal cues, further impacted by fatigue. Pt began drifting off to sleep during divergent naming task. SLP provided education to pt's daughter pertaining to Neuro rehabilitation education, current speech and language deficits as well as strategies to support pt. All questions answered to satisfaction. Pt was left in room with daughter, call bell within reach and chair alarm set. SLP recommends to continue skilled services.     Pain Pain Assessment Pain Score: 0-No pain  Therapy/Group: Individual Therapy  [name]  [facility] 04/13/2022, 3:19 PM

## 2022-04-13 NOTE — Progress Notes (Signed)
PROGRESS NOTE   Subjective/Complaints: Daughter notes patient woke feeling confused where she is this morning Patient's chart reviewed- No issues reported overnight Vitals signs stable   ROS: Patient denies fever, rash, sore throat, blurred vision, dizziness, nausea, vomiting, diarrhea, cough, shortness of breath or chest pain,  back/neck pain, headache, or mood change.     Objective:   No results found.  No results for input(s): WBC, HGB, HCT, PLT in the last 72 hours.   No results for input(s): NA, K, CL, CO2, GLUCOSE, BUN, CREATININE, CALCIUM in the last 72 hours.    Intake/Output Summary (Last 24 hours) at 04/13/2022 0750 Last data filed at 04/13/2022 0300 Gross per 24 hour  Intake 780 ml  Output 1200 ml  Net -420 ml        Physical Exam: Vital Signs Blood pressure 122/61, pulse 72, temperature 98.3 F (36.8 C), temperature source Oral, resp. rate 18, height '5\' 6"'$  (1.676 m), weight 70.3 kg, SpO2 97 %.  Gen: no distress, normal appearing HEENT: oral mucosa pink and moist, NCAT Cardio: Reg rate Chest: normal effort, normal rate of breathing Abd: soft, non-distended  Ext: no clubbing, cyanosis, or edema Psych: a little flat, anxious at times Skin: Clean and intact without signs of breakdown Neuro:  alert oriented to person, reason she's here.  Follows commands. Language improving.. Minimal right central 7.  RUE 3-4/5 at least. RLE 3 to 3+/5. Sensation intact for pain and LT    Musculoskeletal: Able to perform AROM and PROM of limbs and trunk without pain.    Assessment/Plan: 1. Functional deficits which require 3+ hours per day of interdisciplinary therapy in a comprehensive inpatient rehab setting. Physiatrist is providing close team supervision and 24 hour management of active medical problems listed below. Physiatrist and rehab team continue to assess barriers to discharge/monitor patient progress toward  functional and medical goals  Care Tool:  Bathing    Body parts bathed by patient: Right arm, Left arm, Chest, Abdomen, Right upper leg, Left upper leg, Face, Buttocks, Front perineal area   Body parts bathed by helper: Right lower leg, Face     Bathing assist Assist Level: Moderate Assistance - Patient 50 - 74%     Upper Body Dressing/Undressing Upper body dressing   What is the patient wearing?: Pull over shirt    Upper body assist Assist Level: Moderate Assistance - Patient 50 - 74%    Lower Body Dressing/Undressing Lower body dressing      What is the patient wearing?: Pants     Lower body assist Assist for lower body dressing: Maximal Assistance - Patient 25 - 49%     Toileting Toileting    Toileting assist Assist for toileting: Maximal Assistance - Patient 25 - 49%     Transfers Chair/bed transfer  Transfers assist     Chair/bed transfer assist level: Maximal Assistance - Patient 25 - 49%     Locomotion Ambulation   Ambulation assist   Ambulation activity did not occur: Safety/medical concerns  Assist level: 2 helpers Assistive device: No Device Max distance: 4   Walk 10 feet activity   Assist  Walk 10 feet activity did not  occur: Safety/medical concerns        Walk 50 feet activity   Assist Walk 50 feet with 2 turns activity did not occur: Safety/medical concerns         Walk 150 feet activity   Assist Walk 150 feet activity did not occur: Safety/medical concerns         Walk 10 feet on uneven surface  activity   Assist Walk 10 feet on uneven surfaces activity did not occur: Safety/medical concerns         Wheelchair     Assist Is the patient using a wheelchair?: Yes Type of Wheelchair: Manual    Wheelchair assist level: Minimal Assistance - Patient > 75% Max wheelchair distance: 20    Wheelchair 50 feet with 2 turns activity    Assist        Assist Level: Maximal Assistance - Patient 25 - 49%  (Per PT documentation of 20 feet)   Wheelchair 150 feet activity     Assist      Assist Level: Total Assistance - Patient < 25% (Per PT documentation of 20 feet)   Blood pressure 122/61, pulse 72, temperature 98.3 F (36.8 C), temperature source Oral, resp. rate 18, height '5\' 6"'$  (1.676 m), weight 70.3 kg, SpO2 97 %.  Medical Problem List and Plan: 1. Functional deficits secondary to left thalamic intracranial hemorrhage             -patient may shower             -ELOS/Goals: 14-16 days, supervision to min assist  -Continue CIR therapies including PT, OT, and SLP  -Have reduced intensity to 15/7  -still having intermittent lethargy.  Episodic, ?sleep/age related.  -CT/CTA without new disease and demonstrate expected evolution  -continue to observe clinically, avoid neurosedating meds.   -TSH ok, free T4 actually a little high  -ABG with mild hypoxia during one of these "episodes"---off oxgen this morning with sats in 90's 2.  Antithrombotics: -DVT/anticoagulation:  Pharmaceutical: Heparin             -antiplatelet therapy: none 3. Pain: Tylenol as needed. Neurontin 300 mg q HS--reduced to '200mg'$ , continue at this dose.  4. Mood: team providing ego support             -antipsychotic agents: n/a  -family is providing  emotional support as well 5. Neuropsych: This patient is not capable of making decisions on her own behalf. 6. Skin/Wound Care: Routine skin care checks 7. Fluids/Electrolytes/Nutrition:               --encouraging PO  - intake reasonable  -albumin low, changed to Boost Breeze per pt preference  -recheck labs monday 8. Hypertension: continue diltiazem, hydralazine, nadolol. Home losartan held  -6/3 bp better. Holding nadolol prn for hypotension   -have decreased hydralazine to '25mg'$  , continue at this dose.  9: Hyperlipidemia: continue atorvastatin 10: GI prophylaxis: continue pantoprazole 11: pAF: continue diltiazem. No AC until resolution of ICH, outpt  follow-up 12: Hypothyroidism: continue Synthroid 13: Urinary retention: continue Flomax             - 6/2 no I/O caths since 5/31 14: UTI:  Keflex completed  -  repeat UA neg, UCX neg also 15: AKI: BUN and Cr   improved             -encouraging fluids.  16: Leukocytosis: wbc's normal.   17: Anemia, chronic: no evidence of acute blood loss -hgb 8.9--stable 5/29 -->  recheck monday 18: Polymyalgia rheumatica: prednisone 2 mg with lunch 19. Dysphagia:               -advanced to reg/thins per SLP 20. Dry eye, blurry vision   Pt has macular degeneration of the right eye, uses preservision at home. -Placed order for Prosight (the pharmacy equivalent to Preservision and artifical tears.     - Conjunctiva appear more clear    Continue use of artifical tears and Prosight.    LOS: 11 days A FACE TO FACE EVALUATION WAS PERFORMED  Martha Clan P Doylene Splinter 04/13/2022, 7:50 AM

## 2022-04-14 ENCOUNTER — Other Ambulatory Visit: Payer: Self-pay | Admitting: Family Medicine

## 2022-04-14 DIAGNOSIS — K219 Gastro-esophageal reflux disease without esophagitis: Secondary | ICD-10-CM

## 2022-04-14 NOTE — Progress Notes (Signed)
Physical Therapy Session Note  Patient Details  Name: [name] MRN: [id number] Date of Birth: September 15, 1923  Today's Date: 04/14/2022 PT Individual Time: 9758-8325 PT Individual Time Calculation (min): 43 min   Short Term Goals: Week 1:  PT Short Term Goal 1 (Week 1): pt will roll side to side w/ CGA. PT Short Term Goal 2 (Week 1): Pt will transfer sup/sidelying to sit w/ min A PT Short Term Goal 3 (Week 1): Pt will transfer sit to stand w/ mod A PT Short Term Goal 4 (Week 1): Pt will perform SPT w/ mod A Week 2:    Week 3:     Skilled Therapeutic Interventions/Progress Updates:   PAIN  stand pivot transfer to wc w/mod assist for power up and for wt shifting to R w/transfer.  Gait trials using grocery cart weighted w/approx 25lbs to facilitate core activation/stability.  34f w/heavy mod assist of 1 to facilitate wt shift to L, cue advancement of RLE tactile/verbal, control cart resistance/stability.  251fw/mod assist of 1, second person stabilizing cart.  20f48f/mod assist of 1, second person as above.  Gait pattern:  R lean but improves w/repeated trials, ataxic advancement of RLE, scissoring, inattention w/missed steps or incomplete step RLE.  stand pivot transfer wc to recliner to L w/mod assist. Pt left oob in recliner w/chair alarm set and needs in reach.      Therapy Documentation Precautions:  Precautions Precautions: Fall Precaution Comments: R hemi Restrictions Weight Bearing Restrictions: No    Therapy/Group: Individual Therapy BarCallie FieldingT   BarJerrilyn Cairo4/2023, 12:46 PM

## 2022-04-14 NOTE — Progress Notes (Signed)
PROGRESS NOTE   Subjective/Complaints: No new complaints this morning Sleepy this morning, but participated in therapy well later in the day Denies pain  ROS: Patient denies fever, rash, sore throat, blurred vision, dizziness, nausea, vomiting, diarrhea, cough, shortness of breath or chest pain,  back/neck pain, headache, or mood change.     Objective:   No results found.  No results for input(s): WBC, HGB, HCT, PLT in the last 72 hours.   No results for input(s): NA, K, CL, CO2, GLUCOSE, BUN, CREATININE, CALCIUM in the last 72 hours.    Intake/Output Summary (Last 24 hours) at 04/14/2022 1833 Last data filed at 04/14/2022 1828 Gross per 24 hour  Intake 840 ml  Output 800 ml  Net 40 ml        Physical Exam: Vital Signs Blood pressure (!) 116/49, pulse (!) 57, temperature 97.6 F (36.4 C), temperature source Oral, resp. rate 18, height '5\' 6"'$  (1.676 m), weight 70.3 kg, SpO2 96 %.  Gen: no distress, normal appearing HEENT: oral mucosa pink and moist, NCAT Cardio: Bradycardia Chest: normal effort, normal rate of breathing Abd: soft, non-distended  Ext: no clubbing, cyanosis, or edema Psych: a little flat, anxious at times Skin: Clean and intact without signs of breakdown Neuro:  alert oriented to person, reason she's here.  Follows commands. Language improving.. Minimal right central 7.  RUE 3-4/5 at least. RLE 3 to 3+/5. Sensation intact for pain and LT    Musculoskeletal: Able to perform AROM and PROM of limbs and trunk without pain.    Assessment/Plan: 1. Functional deficits which require 3+ hours per day of interdisciplinary therapy in a comprehensive inpatient rehab setting. Physiatrist is providing close team supervision and 24 hour management of active medical problems listed below. Physiatrist and rehab team continue to assess barriers to discharge/monitor patient progress toward functional and medical  goals  Care Tool:  Bathing    Body parts bathed by patient: Right arm, Left arm, Chest, Abdomen, Right upper leg, Left upper leg, Face, Buttocks, Front perineal area   Body parts bathed by helper: Right lower leg, Left lower leg     Bathing assist Assist Level: Moderate Assistance - Patient 50 - 74%     Upper Body Dressing/Undressing Upper body dressing   What is the patient wearing?: Pull over shirt, Bra    Upper body assist Assist Level: Moderate Assistance - Patient 50 - 74%    Lower Body Dressing/Undressing Lower body dressing      What is the patient wearing?: Pants, Incontinence brief     Lower body assist Assist for lower body dressing: Maximal Assistance - Patient 25 - 49%     Toileting Toileting    Toileting assist Assist for toileting: Total Assistance - Patient < 25%     Transfers Chair/bed transfer  Transfers assist     Chair/bed transfer assist level: Maximal Assistance - Patient 25 - 49%     Locomotion Ambulation   Ambulation assist   Ambulation activity did not occur: Safety/medical concerns  Assist level: 2 helpers Assistive device: No Device Max distance: 4   Walk 10 feet activity   Assist  Walk 10 feet activity  did not occur: Safety/medical concerns        Walk 50 feet activity   Assist Walk 50 feet with 2 turns activity did not occur: Safety/medical concerns         Walk 150 feet activity   Assist Walk 150 feet activity did not occur: Safety/medical concerns         Walk 10 feet on uneven surface  activity   Assist Walk 10 feet on uneven surfaces activity did not occur: Safety/medical concerns         Wheelchair     Assist Is the patient using a wheelchair?: Yes Type of Wheelchair: Manual    Wheelchair assist level: Minimal Assistance - Patient > 75% Max wheelchair distance: 20    Wheelchair 50 feet with 2 turns activity    Assist        Assist Level: Maximal Assistance - Patient 25  - 49% (Per PT documentation of 20 feet)   Wheelchair 150 feet activity     Assist      Assist Level: Total Assistance - Patient < 25% (Per PT documentation of 20 feet)   Blood pressure (!) 116/49, pulse (!) 57, temperature 97.6 F (36.4 C), temperature source Oral, resp. rate 18, height '5\' 6"'$  (1.676 m), weight 70.3 kg, SpO2 96 %.  Medical Problem List and Plan: 1. Functional deficits secondary to left thalamic intracranial hemorrhage             -patient may shower             -ELOS/Goals: 14-16 days, supervision to min assist  -Continue CIR therapies including PT, OT, and SLP  -Have reduced intensity to 15/7  -still having intermittent lethargy.  Episodic, ?sleep/age related.  -CT/CTA without new disease and demonstrate expected evolution  -continue to observe clinically, avoid neurosedating meds.   -TSH ok, free T4 actually a little high  -ABG with mild hypoxia during one of these "episodes"---off oxgen this morning with sats in 90's 2.  Antithrombotics: -DVT/anticoagulation:  Pharmaceutical: Heparin             -antiplatelet therapy: none 3. Pain: continue Tylenol as needed. Neurontin 300 mg q HS--reduced to '200mg'$ , continue at this dose.  4. Mood: team providing ego support             -antipsychotic agents: n/a  -family is providing  emotional support as well 5. Neuropsych: This patient is not capable of making decisions on her own behalf. 6. Skin/Wound Care: Routine skin care checks 7. Fluids/Electrolytes/Nutrition:               --encouraging PO  - intake reasonable  -albumin low, changed to Boost Breeze per pt preference  -recheck labs monday 8. Hypertension: continue diltiazem, hydralazine, nadolol. Home losartan held  -6/3 bp better. Holding nadolol prn for hypotension   -have decreased hydralazine to '25mg'$  , continue at this dose.  9: Hyperlipidemia: continue atorvastatin 10: GI prophylaxis: continue pantoprazole 11: pAF: continue diltiazem. No AC until  resolution of ICH, outpt follow-up 12: Hypothyroidism: continue Synthroid 13: Urinary retention: continue Flomax             - 6/2 no I/O caths since 5/31 14: UTI:  Keflex completed  -  repeat UA neg, UCX neg also 15: AKI: BUN and Cr   improved             -encouraging fluids.  16: Leukocytosis: wbc's normal.   17: Anemia, chronic: no evidence of acute  blood loss -hgb 8.9--stable 5/29 --> recheck monday 18: Polymyalgia rheumatica: prednisone 2 mg with lunch 19. Dysphagia:               -advanced to reg/thins per SLP 20. Dry eye, blurry vision   Pt has macular degeneration of the right eye, uses preservision at home. -Placed order for Prosight (the pharmacy equivalent to Preservision and artifical tears.     - Conjunctiva appear more clear    Continue use of artifical tears and Prosight.    LOS: 12 days A FACE TO FACE EVALUATION WAS PERFORMED  Izora Ribas 04/14/2022, 6:33 PM

## 2022-04-14 NOTE — Progress Notes (Signed)
Occupational Therapy Session Note  Patient Details  Name: [name] MRN: [id number] Date of Birth: 1922-12-14  Today's Date: 04/14/2022 OT Individual Time: 9675-9163 OT Individual Time Calculation (min): 60 min    Short Term Goals: Week 2:  OT Short Term Goal 1 (Week 2): Pt will don a shirt with no more than min cueing for hemi technique and min A OT Short Term Goal 2 (Week 2): Pt will don pants with mod A OT Short Term Goal 3 (Week 2): Pt will maintain sitting balance EOB during ADLs with no more than CGA OT Short Term Goal 4 (Week 2): Patient will complete sit<>stand at the sink with mod A of 1 during BADL task without Stedy  Skilled Therapeutic Interventions/Progress Updates:    Pt in bed upon arrival eating breakfast with NT present. Pt able to feed self after setup. Min verbal cues to check for pocketing in R oral cavity. Pt agreeable to bathing/dressing. Supine>sit EOB with mod A. Sitting balance with CGA. Squat pivot transfer to w/c with max A. Bathing with sit<>stand at sink with mod A. Sit<>stand initially with mod A but faded to max A near end of session during LB dressing tasks. Pt requested to use toilet. Toileting transfers with max A and max verbal cues for sequencing. Pt required use of Stedy for to pull pants over hips 2/2 increased fatigue. Pt continues to initiate use of RUE in all functional tasks but requires max verbal cues for awareness to RUE. Pt transferred to recliner with Stedy. Pt remained in [place] with belt alarm activated. All needs within reach.   Therapy Documentation Precautions:  Precautions Precautions: Fall Precaution Comments: R hemi Restrictions Weight Bearing Restrictions: No   Pain: Pain Assessment Pain Scale: 0-10 Pain Score: 0-No pain   Therapy/Group: Individual Therapy  Leroy Libman 04/14/2022, 10:29 AM

## 2022-04-15 LAB — CBC
HCT: 26.9 % — ABNORMAL LOW (ref 36.0–46.0)
Hemoglobin: 8.8 g/dL — ABNORMAL LOW (ref 12.0–15.0)
MCH: 29.3 pg (ref 26.0–34.0)
MCHC: 32.7 g/dL (ref 30.0–36.0)
MCV: 89.7 fL (ref 80.0–100.0)
Platelets: 294 10*3/uL (ref 150–400)
RBC: 3 MIL/uL — ABNORMAL LOW (ref 3.87–5.11)
RDW: 15.4 % (ref 11.5–15.5)
WBC: 8.4 10*3/uL (ref 4.0–10.5)
nRBC: 0 % (ref 0.0–0.2)

## 2022-04-15 LAB — BASIC METABOLIC PANEL
Anion gap: 5 (ref 5–15)
BUN: 26 mg/dL — ABNORMAL HIGH (ref 8–23)
CO2: 24 mmol/L (ref 22–32)
Calcium: 8.9 mg/dL (ref 8.9–10.3)
Chloride: 112 mmol/L — ABNORMAL HIGH (ref 98–111)
Creatinine, Ser: 1.32 mg/dL — ABNORMAL HIGH (ref 0.44–1.00)
GFR, Estimated: 37 mL/min — ABNORMAL LOW (ref >60)
Glucose, Bld: 98 mg/dL (ref 70–99)
Potassium: 4.2 mmol/L (ref 3.5–5.1)
Sodium: 141 mmol/L (ref 135–145)

## 2022-04-15 NOTE — Progress Notes (Signed)
SLP Cancellation Note  Patient Details Name: ISLAY POLANCO MRN: [id number] DOB: [birth year]   Cancelled treatment:       Patient missed 45 minutes of skilled SLP intervention due to fatigue and declining to participate. Per chart review and reports from both staff and family, patient with increased confusion today. Patient had just been transferred back to bed to be I&O cathed and was sleeping upon arrival. Patient would arouse to answer questions and respond to SLP but declined to participate due to having a rough day and fatigue. Patient's daughter present and also attempted to encourage participation with minimal success. Will re-attempt as able. Continue with current plan of care.                                                                                                 Tymber Stallings 04/15/2022, 2:46 PM

## 2022-04-15 NOTE — Progress Notes (Signed)
Physical Therapy Weekly Progress Note  Patient Details  Name: [name] MRN: [id number] Date of Birth: January 23, 1923  Beginning of progress report period: Apr 03, 2022 End of progress report period: April 15, 2022  Today's Date: 04/15/2022 PT Individual Time: 1115-1200 PT Individual Time Calculation (min): 45 min   Patient has met 4 of 4 short term goals.  Pt is progressing well toward mobility goals, improving independence with bed mobility, balance, transfers, and ambulation. Pt continues to require mod to maxA for much of mobility due to R sided deficits, including motor control, strength, and attention. Pt will benefit from family ed closer to time of discharge.  Patient continues to demonstrate the following deficits muscle weakness, decreased cardiorespiratoy endurance, decreased coordination and decreased motor planning, decreased attention to right, decreased awareness and decreased problem solving, and decreased sitting balance, decreased standing balance, decreased postural control, hemiplegia, and decreased balance strategies and therefore will continue to benefit from skilled PT intervention to increase functional independence with mobility.  Patient progressing toward long term goals..  Continue plan of care.  PT Short Term Goals Week 1:  PT Short Term Goal 1 (Week 1): pt will roll side to side w/ CGA. PT Short Term Goal 1 - Progress (Week 1): Met PT Short Term Goal 2 (Week 1): Pt will transfer sup/sidelying to sit w/ min A PT Short Term Goal 2 - Progress (Week 1): Met PT Short Term Goal 3 (Week 1): Pt will transfer sit to stand w/ mod A PT Short Term Goal 3 - Progress (Week 1): Met PT Short Term Goal 4 (Week 1): Pt will perform SPT w/ mod A PT Short Term Goal 4 - Progress (Week 1): Met Week 2:  PT Short Term Goal 1 (Week 2): Pt will complete bed mobility with minA. PT Short Term Goal 2 (Week 2): Pt will complete bed to chair transfer with minA consistently. PT Short Term Goal  3 (Week 2): Pt will perform sit to stand with minA consistently. PT Short Term Goal 4 (Week 2): Pt will ambulate x30' with modA +1 and LRAD.  Skilled Therapeutic Interventions/Progress Updates:  Ambulation/gait training;Discharge planning;Functional mobility training;Therapeutic Activities;UE/LE Strength taining/ROM;Balance/vestibular training;Community reintegration;Neuromuscular re-education;Patient/family education;Stair training;Therapeutic Exercise;Wheelchair propulsion/positioning   Pt received seated in WC and agrees to therapy. No complaint of pain but does report feeling like "I don't know where I am or who I am". Upon questioning, pt is oriented to self and location but says she just feels "off". PT able to educate pt on progress she has made in therapy and redirect pt's anxiety. Pt taken via WC to gym for time management and energy conservation. Pt performs sit to stand multiple times during session with minA/modA and cues for hand placement and sequencing. Pt ambulates with weighted grocery cart x30' and x25' with modA +1 and +2 WC follow for safety. Mirror provided for visual feedback. Pt tends to abduct L leg and adduct R leg, requiring consistent multimodal cueing for posture and hand placement.  Pt stands in parallel bars, holding onto one bar with both hands and using mirror for visual feedback. Intention of activity is to provide pt with balance challenge in standing while providing pt with NM feedback on neutral posturing. Pt able to achieve close to neutral posture with multimodal cueing and consistent cueing to prevent L leg from abducting and pushing to the R. Once in neutral posture, pt able to maintain standing with CGA/minA for 1-2 minutes.  WC transport back to bed. Stand  pivot from WC>bed with modA and cues for body mechanics and positioning. Sit to supine with minA and cues to attend to R leg, which she had left hanging off the side initially. Pt left with alarm intact and all  needs within reach.  Therapy Documentation Precautions:  Precautions Precautions: Fall Precaution Comments: R hemi Restrictions Weight Bearing Restrictions: No   Therapy/Group: Individual Therapy  Breck Coons, PT, DPT 04/15/2022, 5:57 PM

## 2022-04-15 NOTE — Progress Notes (Signed)
Occupational Therapy Session Note  Patient Details  Name: [name] MRN: [id number] Date of Birth: [birth year]  Today's Date: 04/15/2022 OT Individual Time: 8588-5027 OT Individual Time Calculation (min): 44 min   Short Term Goals: Week 2:  OT Short Term Goal 1 (Week 2): Pt will don a shirt with no more than min cueing for hemi technique and min A OT Short Term Goal 2 (Week 2): Pt will don pants with mod A OT Short Term Goal 3 (Week 2): Pt will maintain sitting balance EOB during ADLs with no more than CGA OT Short Term Goal 4 (Week 2): Patient will complete sit<>stand at the sink with mod A of 1 during BADL task without Stedy  Skilled Therapeutic Interventions/Progress Updates:    Pt greeted semi-reclined in bed with daughter present. Pt reported being confused, but recognized this therapist when I got closer. Pt anxious, but ready to participate in OT. Pt completed bed mobility with increased time and mod A. Stand-pivot transfer from bed to wc with max A due to pushing to the R requiring facilitation to achieve midline prior to transfer. BADL tasks completed from wc at the sink with focus on increased smoothness and accuracy when using R UE for bathing. Pt able to recall hemi dressing techniques and could don shirt with min A today. Worked on sit<.stands at the sink with min A overall and mirror feedback to maintain midline, occasional Mod A for standing balance due to R lateral lean. Addressed standing balance/endurance with standing grooming tasks while using R UE to stabilize at the sink for NMR. Pt demonstrated sme improved accuracy when using R UE today during BADLs. Pt stated she felt better after BADLs but still reported feeling anxious and confused to daughter when she returned to the room. Pt left seated in wc at end of session with alarm belt on, call bell in reach, and daughter present.    Therapy Documentation Precautions:  Precautions Precautions: Fall Precaution Comments: R  hemi Restrictions Weight Bearing Restrictions: No Pain:  Denies pain   Therapy/Group: Individual Therapy  Valma Cava 04/15/2022, 1:00 PM

## 2022-04-15 NOTE — Progress Notes (Signed)
PROGRESS NOTE   Subjective/Complaints: Alert but confused and anxious. Could tell me she was in the hospital. Told me she felt confused  ROS: Limited due to cognitive/behavioral      Objective:   No results found.  Recent Labs    04/15/22 0546  WBC 8.4  HGB 8.8*  HCT 26.9*  PLT 294     Recent Labs    04/15/22 0546  NA 141  K 4.2  CL 112*  CO2 24  GLUCOSE 98  BUN 26*  CREATININE 1.32*  CALCIUM 8.9      Intake/Output Summary (Last 24 hours) at 04/15/2022 0926 Last data filed at 04/15/2022 0856 Gross per 24 hour  Intake 660 ml  Output 600 ml  Net 60 ml        Physical Exam: Vital Signs Blood pressure (!) 115/50, pulse 65, temperature 98 F (36.7 C), resp. rate 16, height '5\' 6"'$  (1.676 m), weight 70.3 kg, SpO2 96 %.  Constitutional: No distress . Vital signs reviewed. HEENT: NCAT, EOMI, oral membranes moist Neck: supple Cardiovascular: RRR without murmur. No JVD    Respiratory/Chest: CTA Bilaterally without wheezes or rales. Normal effort    GI/Abdomen: BS +, non-tender, non-distended Ext: no clubbing, cyanosis, or edema Psych: anxious, distracted. Does cooperate with exam Skin: Clean and intact without signs of breakdown Neuro:  alert oriented to person,hospital. "I feel confused".  Follows commands. Language improving.. Minimal right central 7.  RUE 3-4/5 at least. RLE 3 to 3+/5. Sensation intact for pain and LT Musculoskeletal: Able to perform AROM and PROM of limbs and trunk without pain.    Assessment/Plan: 1. Functional deficits which require 3+ hours per day of interdisciplinary therapy in a comprehensive inpatient rehab setting. Physiatrist is providing close team supervision and 24 hour management of active medical problems listed below. Physiatrist and rehab team continue to assess barriers to discharge/monitor patient progress toward functional and medical goals  Care Tool:  Bathing     Body parts bathed by patient: Right arm, Left arm, Chest, Abdomen, Right upper leg, Left upper leg, Face, Buttocks, Front perineal area   Body parts bathed by helper: Right lower leg, Left lower leg     Bathing assist Assist Level: Moderate Assistance - Patient 50 - 74%     Upper Body Dressing/Undressing Upper body dressing   What is the patient wearing?: Pull over shirt, Bra    Upper body assist Assist Level: Moderate Assistance - Patient 50 - 74%    Lower Body Dressing/Undressing Lower body dressing      What is the patient wearing?: Pants, Incontinence brief     Lower body assist Assist for lower body dressing: Maximal Assistance - Patient 25 - 49%     Toileting Toileting    Toileting assist Assist for toileting: Total Assistance - Patient < 25%     Transfers Chair/bed transfer  Transfers assist     Chair/bed transfer assist level: Maximal Assistance - Patient 25 - 49%     Locomotion Ambulation   Ambulation assist   Ambulation activity did not occur: Safety/medical concerns  Assist level: 2 helpers Assistive device: No Device Max distance: 4   Walk  10 feet activity   Assist  Walk 10 feet activity did not occur: Safety/medical concerns        Walk 50 feet activity   Assist Walk 50 feet with 2 turns activity did not occur: Safety/medical concerns         Walk 150 feet activity   Assist Walk 150 feet activity did not occur: Safety/medical concerns         Walk 10 feet on uneven surface  activity   Assist Walk 10 feet on uneven surfaces activity did not occur: Safety/medical concerns         Wheelchair     Assist Is the patient using a wheelchair?: Yes Type of Wheelchair: Manual    Wheelchair assist level: Minimal Assistance - Patient > 75% Max wheelchair distance: 20    Wheelchair 50 feet with 2 turns activity    Assist        Assist Level: Maximal Assistance - Patient 25 - 49% (Per PT documentation of 20  feet)   Wheelchair 150 feet activity     Assist      Assist Level: Total Assistance - Patient < 25% (Per PT documentation of 20 feet)   Blood pressure (!) 115/50, pulse 65, temperature 98 F (36.7 C), resp. rate 16, height '5\' 6"'$  (1.676 m), weight 70.3 kg, SpO2 96 %.  Medical Problem List and Plan: 1. Functional deficits secondary to left thalamic intracranial hemorrhage             -patient may shower             -ELOS/Goals: 14-16 days, supervision to min assist  -Continue CIR therapies including PT, OT, and SLP  -Have reduced intensity to 15/7  -CT/CTA without new disease and demonstrate expected evolution  -continue to observe clinically, avoid neurosedating meds.   -TSH ok, free T4 actually a little high  -cognitive status waxes and wanes. ?effect of melatonin---will dc. Spoke with daughter re: presentation which is likely related to age, dx, hospital.  2.  Antithrombotics: -DVT/anticoagulation:  Pharmaceutical: Heparin             -antiplatelet therapy: none 3. Pain: continue Tylenol as needed. Neurontin 300 mg q HS--reduced to '200mg'$ , continue at this dose.  4. Mood: team providing ego support             -antipsychotic agents: n/a  -family is providing  emotional support as well 5. Neuropsych: This patient is not capable of making decisions on her own behalf. 6. Skin/Wound Care: Routine skin care checks 7. Fluids/Electrolytes/Nutrition:               --encouraging PO  - intake reasonable  -albumin low, changed to Boost Breeze per pt preference  -labs ok today 8. Hypertension: continue diltiazem, hydralazine, nadolol. Home losartan held  -6/3 bp better. Holding nadolol prn for hypotension   -have decreased hydralazine to '25mg'$  , continue at this dose.  9: Hyperlipidemia: continue atorvastatin 10: GI prophylaxis: continue pantoprazole 11: pAF: continue diltiazem. No AC until resolution of ICH, outpt follow-up 12: Hypothyroidism: continue Synthroid 13: Urinary  retention: continue Flomax             - 6/5 requiring an occasional I/O cath 14: UTI:  Keflex completed  -  repeat UA neg, UCX neg also 15: AKI: BUN and Cr  have improved             -encouraging fluids.  16: Leukocytosis: wbc's normal.   17: Anemia,  chronic: no evidence of acute blood loss -hgb stable at 8.8 6/5 18: Polymyalgia rheumatica: prednisone 2 mg with lunch 19. Dysphagia:               -advanced to reg/thins per SLP 20. Dry eye, blurry vision   Pt has macular degeneration of the right eye, uses preservision at home. - Conjunctiva appear more clear    Continue use of artifical tears and Prosight.    LOS: 13 days A FACE TO FACE EVALUATION WAS PERFORMED  Meredith Staggers 04/15/2022, 9:26 AM

## 2022-04-16 ENCOUNTER — Inpatient Hospital Stay (HOSPITAL_COMMUNITY): Payer: Medicare PPO

## 2022-04-16 DIAGNOSIS — M25512 Pain in left shoulder: Secondary | ICD-10-CM

## 2022-04-16 MED ORDER — BETHANECHOL CHLORIDE 10 MG PO TABS
10.0000 mg | ORAL_TABLET | Freq: Three times a day (TID) | ORAL | Status: DC
  Administered 2022-04-16 – 2022-04-26 (×30): 10 mg via ORAL
  Filled 2022-04-16 (×30): qty 1

## 2022-04-16 MED ORDER — GABAPENTIN 100 MG PO CAPS: 100.0000 mg | ORAL_CAPSULE | Freq: Every day | ORAL | Status: DC

## 2022-04-16 MED ORDER — DICLOFENAC SODIUM 1 % EX GEL
2.0000 g | Freq: Three times a day (TID) | CUTANEOUS | Status: DC
  Administered 2022-04-16 – 2022-04-30 (×25): 2 g via TOPICAL
  Filled 2022-04-16: qty 100

## 2022-04-16 NOTE — Progress Notes (Signed)
PROGRESS NOTE   Subjective/Complaints: Feels much better today. Said she's afraid to sleep because of fear of waking up disoriented. Did sleep some last night though. C/o ongoing left shoulder pain after ranging it too far in therapy last week.   ROS: Patient denies fever, rash, sore throat, blurred vision, dizziness, nausea, vomiting, diarrhea, cough, shortness of breath or chest pain, joint or back/neck pain, headache, or mood change.      Objective:   No results found.  Recent Labs    04/15/22 0546  WBC 8.4  HGB 8.8*  HCT 26.9*  PLT 294     Recent Labs    04/15/22 0546  NA 141  K 4.2  CL 112*  CO2 24  GLUCOSE 98  BUN 26*  CREATININE 1.32*  CALCIUM 8.9      Intake/Output Summary (Last 24 hours) at 04/16/2022 0917 Last data filed at 04/16/2022 0455 Gross per 24 hour  Intake 240 ml  Output 1375 ml  Net -1135 ml        Physical Exam: Vital Signs Blood pressure 118/65, pulse (!) 55, temperature 98.2 F (36.8 C), temperature source Oral, resp. rate 19, height '5\' 6"'$  (1.676 m), weight 70.3 kg, SpO2 99 %.  Constitutional: No distress . Vital signs reviewed. HEENT: NCAT, EOMI, oral membranes moist Neck: supple Cardiovascular: RRR without murmur. No JVD    Respiratory/Chest: CTA Bilaterally without wheezes or rales. Normal effort    GI/Abdomen: BS +, non-tender, non-distended Ext: no clubbing, cyanosis, or edema Psych: pleasant and cooperative  Skin: Clean and intact without signs of breakdown Neuro:  alert oriented to person,hospital, reason. Much more focused  Follows commands. Language improving.. Minimal right central 7.  RUE 3-4/5 at least. RLE 3 to 3+/5. Sensation intact for pain and LT Musculoskeletal: left shoulder tender with PROM. Could not abduct past 80 degrees without pain. No deformity    Assessment/Plan: 1. Functional deficits which require 3+ hours per day of interdisciplinary therapy in a  comprehensive inpatient rehab setting. Physiatrist is providing close team supervision and 24 hour management of active medical problems listed below. Physiatrist and rehab team continue to assess barriers to discharge/monitor patient progress toward functional and medical goals  Care Tool:  Bathing    Body parts bathed by patient: Right arm, Left arm, Chest, Abdomen, Right upper leg, Left upper leg, Face, Buttocks, Front perineal area   Body parts bathed by helper: Right lower leg, Left lower leg     Bathing assist Assist Level: Moderate Assistance - Patient 50 - 74%     Upper Body Dressing/Undressing Upper body dressing   What is the patient wearing?: Pull over shirt, Bra    Upper body assist Assist Level: Moderate Assistance - Patient 50 - 74%    Lower Body Dressing/Undressing Lower body dressing      What is the patient wearing?: Pants, Incontinence brief     Lower body assist Assist for lower body dressing: Maximal Assistance - Patient 25 - 49%     Toileting Toileting    Toileting assist Assist for toileting: Total Assistance - Patient < 25%     Transfers Chair/bed transfer  Transfers assist  Chair/bed transfer assist level: Maximal Assistance - Patient 25 - 49%     Locomotion Ambulation   Ambulation assist   Ambulation activity did not occur: Safety/medical concerns  Assist level: 2 helpers Assistive device: No Device Max distance: 4   Walk 10 feet activity   Assist  Walk 10 feet activity did not occur: Safety/medical concerns        Walk 50 feet activity   Assist Walk 50 feet with 2 turns activity did not occur: Safety/medical concerns         Walk 150 feet activity   Assist Walk 150 feet activity did not occur: Safety/medical concerns         Walk 10 feet on uneven surface  activity   Assist Walk 10 feet on uneven surfaces activity did not occur: Safety/medical concerns         Wheelchair     Assist Is the  patient using a wheelchair?: Yes Type of Wheelchair: Manual    Wheelchair assist level: Minimal Assistance - Patient > 75% Max wheelchair distance: 20    Wheelchair 50 feet with 2 turns activity    Assist        Assist Level: Maximal Assistance - Patient 25 - 49% (Per PT documentation of 20 feet)   Wheelchair 150 feet activity     Assist      Assist Level: Total Assistance - Patient < 25% (Per PT documentation of 20 feet)   Blood pressure 118/65, pulse (!) 55, temperature 98.2 F (36.8 C), temperature source Oral, resp. rate 19, height '5\' 6"'$  (1.676 m), weight 70.3 kg, SpO2 99 %.  Medical Problem List and Plan: 1. Functional deficits secondary to left thalamic intracranial hemorrhage             -patient may shower             -ELOS/Goals: 14-16 days, supervision to min assist  -Continue CIR therapies including PT, OT, and SLP  -Have reduced intensity to 15/7  -ongoing waxing/waning- likely multifactorial.    -minimize meds, maximize sleep   -expect improvement once she's home   -6/6 looks great today 2.  Antithrombotics: -DVT/anticoagulation:  Pharmaceutical: Heparin             -antiplatelet therapy: none 3. Pain: continue Tylenol as needed. Neurontin 300 mg q HS--reduced to '200mg'$ , continue at this dose.   -add voltaren gel to left shoulder   -check xray 4. Mood/sleep: team providing ego support             -antipsychotic agents: n/a  -family is providing  emotional support as well  -melatonin dc'ed 5. Neuropsych: This patient is not capable of making decisions on her own behalf. 6. Skin/Wound Care: Routine skin care checks 7. Fluids/Electrolytes/Nutrition:               --encouraging PO  - intake reasonable  -albumin low, changed to Boost Breeze per pt preference  -labs ok today 8. Hypertension: continue diltiazem, hydralazine, nadolol. Home losartan held  -6/6 bp better. Holding nadolol prn for hypotension   -have decreased hydralazine to '25mg'$  ,  continue at this dose.  9: Hyperlipidemia: continue atorvastatin 10: GI prophylaxis: continue pantoprazole 11: pAF: continue diltiazem. No AC until resolution of ICH, outpt follow-up 12: Hypothyroidism: continue Synthroid 13: Urinary retention: continue Flomax             - 6/6 requiring an occasional I/O cath 14: UTI:  Keflex completed  -  repeat  UA neg, UCX neg also 15: AKI: BUN and Cr  have improved             -encouraging fluids.  16: Leukocytosis: wbc's normal.   17: Anemia, chronic: no evidence of acute blood loss -hgb stable at 8.8 6/5 18: Polymyalgia rheumatica: prednisone 2 mg with lunch 19. Dysphagia:               -advanced to reg/thins per SLP 20. Dry eye, blurry vision   Pt has macular degeneration of the right eye, uses preservision at home. - Conjunctiva appear more clear    Continue use of artifical tears and Prosight.    LOS: 14 days A FACE TO FACE EVALUATION WAS PERFORMED  Meredith Staggers 04/16/2022, 9:17 AM

## 2022-04-16 NOTE — Progress Notes (Signed)
Speech Language Pathology Weekly Progress and Session Note  Patient Details  Name: [name] MRN: [id number] Date of Birth: July 15, 1923  Beginning of progress report period: Apr 09, 2022 End of progress report period: April 16, 2022  Today's Date: 04/16/2022 SLP Individual Time: 5643-3295 SLP Individual Time Calculation (min): 45 min  Short Term Goals: Week 2: SLP Short Term Goal 1 (Week 2): Pt will demonstrate 90% speech intelligibility sentence level with Mod I for over articulation and rate SLP Short Term Goal 1 - Progress (Week 2): Not met SLP Short Term Goal 2 (Week 2): Pt will demonstrate word finding skills in higher level task (ex: divergent/convergent naming and abstract topics) with Supervision A semantic cues. SLP Short Term Goal 2 - Progress (Week 2): Met SLP Short Term Goal 3 (Week 2): Pt will demonstrate mildly complex problem solving skills with supervision A verbal cues. SLP Short Term Goal 3 - Progress (Week 2): Not met SLP Short Term Goal 4 (Week 2): Pt will demonstrate recall of novel, daily information with Supevision A verbal cues for use of external aids. SLP Short Term Goal 4 - Progress (Week 2): Not met    New Short Term Goals: Week 3: SLP Short Term Goal 1 (Week 3): Pt will demonstrate 90% speech intelligibility sentence level with Mod I for over articulation and rate. SLP Short Term Goal 2 (Week 3): Pt will demonstrate word finding skills in higher level task (ex: divergent/convergent naming and abstract topics) with Mod I. SLP Short Term Goal 3 (Week 3): Pt will demonstrate mildly complex problem solving skills with supervision A verbal cues. SLP Short Term Goal 4 (Week 3): Pt will demonstrate recall of novel, daily information with Supevision A verbal cues for use of external aids.  Weekly Progress Updates: Patient has made functional but inconsistent gains and has met 1 of 4 STGs this reporting period. Currently, patient requires overall supervision level  verbal cues for word-finding and use of speech intelligibility strategies at the sentence level. Patient also requires overall Min verbal cues to complete functional and mildly complex tasks safely in regards to problem solving and recall. Patient's overall function can be impacted by intermittent fatigue and confusion which impacts her ability to participate at times. Patient and family education ongoing. Patient would benefit from continued skilled SLP intervention to maximize her cognitive-linguistic functioning and overall functional independence prior to discharge.     Intensity: Minumum of 1-2 x/day, 30 to 90 minutes Frequency: 3 to 5 out of 7 days Duration/Length of Stay: 04/30/22 Treatment/Interventions: Cognitive remediation/compensation;Cueing hierarchy;Functional tasks;Internal/external aids;Speech/Language facilitation;Patient/family education;Dysphagia/aspiration precaution training;Environmental controls;Therapeutic Activities   Daily Session  Skilled Therapeutic Interventions:  Skilled treatment session focused on cognitive-linguistic goals. Upon arrival, patient was awake in her wheelchair and appeared brighter today. Patient reported she did not feel confused this morning because she did not sleep. Patient reported she was afraid to fall asleep because she did not like knowing that she could wake up and possibly be confused. SLP provided support but also encouraged sleep to help facilitate rest and overall cognitive remediation. SLP facilitated session by providing extra time for word-finding during a complex structured language task. Patient requested to use the bathroom and required Min verbal cues for safety during task. Patient was transferred to the commode via the stedy +2 and was continent of bowel. Patient handed off to nursing at end of session. Continue with current plan of care.     Pain No/Denies Pain   Therapy/Group: Individual Therapy  Amery Minasyan 04/16/2022, 6:44  AM

## 2022-04-16 NOTE — Progress Notes (Signed)
Occupational Therapy Session Note  Patient Details  Name: [name] MRN: [id number] Date of Birth: [birth year]  Today's Date: 04/16/2022 OT Individual Time: 1030-1100 OT Individual Time Calculation (min): 30 min    Short Term Goals: Week 2:  OT Short Term Goal 1 (Week 2): Pt will don a shirt with no more than min cueing for hemi technique and min A OT Short Term Goal 2 (Week 2): Pt will don pants with mod A OT Short Term Goal 3 (Week 2): Pt will maintain sitting balance EOB during ADLs with no more than CGA OT Short Term Goal 4 (Week 2): Patient will complete sit<>stand at the sink with mod A of 1 during BADL task without Stedy  Skilled Therapeutic Interventions/Progress Updates:    S: Pt in recliner upon therapy arrival. Reports pain in her left arm 5/10 with movement and use. States she just had an X-ray performed.   O: - Manual therapy: Myofascial release completed to left upper arm, upper trapezius, and scapularis region to decrease fascial restrictions and increase joint mobility in a pain free zone.  - P/ROM: seated, shoulder, flexion (just under 90 degrees), 5X - Relaxation techniques education provided during passive ROM and manual techniques to to help increase tolerance level during P/ROM. - Ice  therapy applied to left upper arm at end of session. Suggested to keep on for 10 minutes.    A: Patient demonstrates increased pain when using/moving her arm. Increase muscle guarding present with max difficulty to decrease. Discussed relaxation techniques while discussing each activity/technique therapist was performing to help decrease resistance. Patient verbalized understanding and did attempt to carry over education during session.     P: Work on decreasing muscle guarding and provide education on activity modifications to use when LUE is assisting more with daily tasks.    Therapy Documentation Precautions:  Precautions Precautions: Fall Precaution Comments: R  hemi Restrictions Weight Bearing Restrictions: No  Pain:  Reports 5/10 pain level in left upper arm. Has not requested pain meds. States she wanted to wait until she returned from her X-ray. Ice was applied at end of session. Monitored pain and provided relaxation techniques during session.   Therapy/Group: Individual Therapy  Ailene Ravel, OTR/L,CBIS  Supplemental OT - [facility]  04/16/2022, 7:59 AM

## 2022-04-16 NOTE — Patient Care Conference (Signed)
Inpatient RehabilitationTeam Conference and Plan of Care Update Date: 04/16/2022   Time: 10:37 AM    Patient Name: [name]      Medical Record Number: [id number]  Date of Birth: 1923-02-01 Sex: Female         Room/Bed: 4M02C/4M02C-01 Payor Info: Payor: [employer] MEDICARE / Plan: [employer] MEDICARE CHOICE PPO / Product Type: *No Product type* /    Admit Date/Time:  04/02/2022  4:45 PM  Primary Diagnosis:  ICH (intracerebral hemorrhage) [facility])  Hospital Problems: Principal Problem:   ICH (intracerebral hemorrhage) ([place])    Expected Discharge Date: Expected Discharge Date: 04/30/22  Team Members Present: Physician leading conference: Dr. Alger Simons Social Worker Present: Loralee Pacas, [place] Nurse Present: Dorthula Nettles, RN PT Present: Tereasa Coop, PT OT Present: Cherylynn Ridges, OT SLP Present: Weston Anna, SLP PPS Coordinator present : Ileana Ladd, PT     Current Status/Progress Goal Weekly Team Focus  Bowel/Bladder   Unable to urinate, I/O q 8 hours. last BM 6/5  regain cont.  toilet patient q 2-3 hours when awake to [facility]   Swallow/Nutrition/ Hydration   Regular textures with thin liquids, supervision  Mod I  tolerance of current diet   ADL's   Min/mod A UB bathing/dressing,  mod/max A LB ADLs, Mod/max A transfers, sit<>stad min A. Transitional movements difficulty 2/2 apraxia. Improved smoothness and accuracy with R UE  Min A  R NMR, self-care retraining, balance, activity tolerance, dc planning   Mobility   minA to modA overall, ambulating up to 30' with weighted grocery cart  min A overall  balance, transfers, activity tolerance, R attention and NMR, gait training   Communication   Supervision-Min A  mod I speech intelligiblity; sup A word finding  use of speech intelligibility and word-finding strategies   Safety/Cognition/ Behavioral Observations  Min A  sup A  basic problem solving, memory compensatory strategies, anticipatory awareness   Pain   pain to  BLE. pain 4of 10  pain<3  assess pain q shift and PRN   Skin   no skin breakdown. Eccymosis to BUE, abdomen  remain intact  assess skin q shift and PRN     Discharge Planning:  D/c tohome with hired caregivers 4 days per week 9am-3pm; and rotating support from children .   Team Discussion: Discontinued Melatonin, added medication for bladder. Labs good. Constipation issue, I&O caths. Knee pain reported. Anxious and confused. Children to assist on weekends, caregivers during week.  Patient on target to meet rehab goals: yes, min assist. Currently RUE more accurate. Left shoulder pain, X-ray ordered. Walked 30 ft with weighted cart. Language improved. Working on higher level problem solving, memory.  *See Care Plan and progress notes for long and short-term goals.   Revisions to Treatment Plan:  Adjusting medications   Teaching Needs: Family education, medication/pain management, transfer/gait training, etc.   Current Barriers to Discharge: Decreased caregiver support, Home enviroment access/layout, and Lack of/limited family support  Possible Resolutions to Barriers: Family education Follow-up therapy Order recommended DME     Medical Summary Current Status: waxing and waning cognitive status. some urine retention. bp's better, some anxiety, left shoulder pain  Barriers to Discharge: Medical stability   Possible Resolutions to [employer] Focus: daily assessment of labs, up to toilet to void.xrays of left shoulder--voltaren gel   Continued Need for Acute Rehabilitation Level of Care: The patient requires daily medical management by a physician with specialized training in physical medicine and rehabilitation for the following reasons: Direction  of a multidisciplinary physical rehabilitation program to maximize functional independence : Yes Medical management of patient stability for increased activity during participation in an intensive rehabilitation regime.:  Yes Analysis of laboratory values and/or radiology reports with any subsequent need for medication adjustment and/or medical intervention. : Yes   I attest that I was present, lead the team conference, and concur with the assessment and plan of the team.   Cristi Loron 04/16/2022, 2:54 PM

## 2022-04-16 NOTE — Progress Notes (Signed)
Physical Therapy Session Note  Patient Details  Name: [name] MRN: [id number] Date of Birth: [birth year]  Today's Date: 04/16/2022 PT Individual Time: 4696-2952 PT Individual Time Calculation (min): 44 min   Short Term Goals: Week 2:  PT Short Term Goal 1 (Week 2): Pt will complete bed mobility with minA. PT Short Term Goal 2 (Week 2): Pt will complete bed to chair transfer with minA consistently. PT Short Term Goal 3 (Week 2): Pt will perform sit to stand with minA consistently. PT Short Term Goal 4 (Week 2): Pt will ambulate x30' with modA +1 and LRAD.  Skilled Therapeutic Interventions/Progress Updates:     Pt received seated in recliner and agrees to therapy. NO ocmplaint of pain. Pt performs squat pivot transfer to WC with modA and improved body mechanics and sequencing, with tactile cueing and verbal cueing for initiation. WC transport to gym for time management. Pt performs sit to stand to RW with modA and cues fo rhan d placement and body mechanics. Pt ambulates x40' with RW. Initially pt ambulates with minA and much improved motor control of R lower extremity. With fatigue, assistance required increases and pt has increased difficulty with placement and motor control of R hand and R leg. MaxA required due to pt taking R hand off RW. Pt visibly dyspneic following ambulation and requires extended seated rest break to recover. PT demonstrates pt's current gait pattern and provides demonstration of exaggerating a more typical gait pattern. Pt then ambulates additional 30' with RW and modA overall. Pt requires increased cueing and assistance while turning to transition back to WC. WC transport back to room. Pt left seated with alarm intact and all needs within reach.  Therapy Documentation Precautions:  Precautions Precautions: Fall Precaution Comments: R hemi Restrictions Weight Bearing Restrictions: No    Therapy/Group: Individual Therapy  Breck Coons, PT, DPT 04/16/2022,  6:14 PM

## 2022-04-16 NOTE — Progress Notes (Signed)
Patient ID: [name], female   DOB: 1923/08/13, 88 y.o.   MRN: [id number]  SW met with pt and pt son [name] in room to provide updates from team conference, and d/c date remains 6/20. SW discussed family education later next week. Pt son intends to speak with his siblings to discuss a date/time.   *SW later met with pt children [name] and [name] to discuss above. Family reports pt states she is having bad dreams which is the reason she will not go to sleep but will not disclose what she is dreaming about. Also reports her feeling confused when waking up (medical team aware). Family will f/u with this SW about family edu next week  Loralee Pacas, MSW, [place] Office: [phone] Cell: [phone] Fax: [phone]

## 2022-04-16 NOTE — Progress Notes (Signed)
Occupational Therapy Weekly Progress Note  Patient Details  Name: [name] MRN: [id number] Date of Birth: 1923/08/06  Beginning of progress report period: Apr 02, 2022 End of progress report period: April 16, 2022  Today's Date: 04/16/2022 OT Individual Time: 5366-4403 OT Individual Time Calculation (min): 43 min    Patient has met 3 of 4 short term goals.  Patient has made steady progress towards OT goals this week. Her R UE has improved with increased smoothness and accuracy within functional tasks. Sit<>stands at the sink can be as little as min A, but mod A at times due to motor planning. Transfers fluctuate from mod to max A. Patient has still had mornings of confusion and lethargy, but is overall having more good days! Continue current POC.  Patient continues to demonstrate the following deficits: muscle weakness, decreased cardiorespiratoy endurance, impaired timing and sequencing, abnormal tone, unbalanced muscle activation, motor apraxia, ataxia, decreased coordination, and decreased motor planning, decreased midline orientation, decreased attention to right, right side neglect, and decreased motor planning, decreased initiation, decreased attention, decreased awareness, decreased problem solving, decreased safety awareness, and decreased memory, and decreased sitting balance, decreased standing balance, decreased postural control, hemiplegia, and decreased balance strategies and therefore will continue to benefit from skilled OT intervention to enhance overall performance with BADL and Reduce care partner burden.  Patient progressing toward long term goals..  Continue plan of care.  OT Short Term Goals Week 2:  OT Short Term Goal 1 (Week 2): Pt will don a shirt with no more than min cueing for hemi technique and min A OT Short Term Goal 1 - Progress (Week 2): Met OT Short Term Goal 2 (Week 2): Pt will don pants with mod A OT Short Term Goal 2 - Progress (Week 2): Progressing toward  goal OT Short Term Goal 3 (Week 2): Pt will maintain sitting balance EOB during ADLs with no more than CGA OT Short Term Goal 3 - Progress (Week 2): Met OT Short Term Goal 4 (Week 2): Patient will complete sit<>stand at the sink with mod A of 1 during BADL task without Stedy OT Short Term Goal 4 - Progress (Week 2): Met Week 3:  OT Short Term Goal 1 (Week 3): Pt will don pants with mod A OT Short Term Goal 2 (Week 3): Patient will perform toilet transfer with mod A of 1 and no Stedy OT Short Term Goal 3 (Week 3): Patient will complete 1 step of toileting task  Skilled Therapeutic Interventions/Progress Updates:    Patient greeted sitting upright in bed with nurse techs providing supervision for meal consumption, handoff to OT. Pt completed her meal using L hand to manage utensils. She needed min cues to check for pocketing on R side. Pt with much brighter affect this morning and did not express any feelings of confusion. Pt completed bed mobility with use of hand rails and supervision!! Patient was very proud of herself and initiated use of R side with improved accuracy and strength. Practiced head/hips relationship with lateral scoot along EOB, then transitioned to squat-pivot using Bobath method and Mod A. Bathing/dressing from wc at the sink with focus on R UE NMR and forced use throughout bathing tasks. Pt with overall improved accuracy with R UE while bathing and threading clothing items. Sit<>stands with min/mod A and facilitation for anterior weight shift and achieving midline in standing. Pt left seated in wc at end of session handoff to SLP for next therapy session.  Therapy Documentation Precautions:  Precautions Precautions: Fall Precaution Comments: R hemi Restrictions Weight Bearing Restrictions: No Pain:  Denies pain   Therapy/Group: Individual Therapy  Valma Cava 04/16/2022, 8:43 AM

## 2022-04-17 NOTE — Progress Notes (Signed)
PROGRESS NOTE   Subjective/Complaints:  No apparent issues this morning. A little sleepy as I rounded earlier today  ROS: Limited due to cognitive/behavioral     Objective:   DG Shoulder Left  Addendum Date: 04/16/2022   ADDENDUM REPORT: 04/16/2022 19:01 ADDENDUM: Dictation error in the impression section of the report, which should state: NO evidence of acute displaced fracture. Electronically Signed   By: Dahlia Bailiff M.D.   On: 04/16/2022 19:01   Result Date: 04/16/2022 CLINICAL DATA:  Left arm pain times a few days. EXAM: LEFT SHOULDER - 2+ VIEW COMPARISON:  Multiple priors including CT chest May 11, 2020. FINDINGS: There is no evidence of acute displaced fracture or dislocation. Chronic fracture deformity of the proximal humerus is unchanged from prior. 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 Chronic appearing left-sided rib fractures. IMPRESSION: Evidence of acute displaced fracture. Similar appearance of the chronic healed fracture deformity of the proximal humerus and severe degenerative change of the glenohumeral joint. Electronically Signed: By: Dahlia Bailiff M.D. On: 04/16/2022 11:20    Recent Labs    04/15/22 0546  WBC 8.4  HGB 8.8*  HCT 26.9*  PLT 294     Recent Labs    04/15/22 0546  NA 141  K 4.2  CL 112*  CO2 24  GLUCOSE 98  BUN 26*  CREATININE 1.32*  CALCIUM 8.9      Intake/Output Summary (Last 24 hours) at 04/17/2022 0842 Last data filed at 04/16/2022 2224 Gross per 24 hour  Intake 180 ml  Output 800 ml  Net -620 ml        Physical Exam: Vital Signs Blood pressure 124/69, pulse 74, temperature 97.6 F (36.4 C), temperature source Oral, resp. rate 20, height '5\' 6"'$  (1.676 m), weight 70.3 kg, SpO2 97 %.  Constitutional: No distress . Vital signs reviewed. HEENT: NCAT, EOMI, oral membranes moist Neck: supple Cardiovascular: RRR without murmur. No JVD     Respiratory/Chest: CTA Bilaterally without wheezes or rales. Normal effort    GI/Abdomen: BS +, non-tender, non-distended Ext: no clubbing, cyanosis, or edema Psych: pleasant and cooperative  Skin: Clean and intact without signs of breakdown Neuro:  alert oriented to person,hospital, reason. Much more focused  Follows commands. Language improving.. Minimal right central 7.  RUE 3-4/5 at least. RLE 3 to 3+/5. Sensation intact for pain and LT Musculoskeletal: left shoulder tender with PROM. Could not abduct past 80 degrees without pain. No deformity    Assessment/Plan: 1. Functional deficits which require 3+ hours per day of interdisciplinary therapy in a comprehensive inpatient rehab setting. Physiatrist is providing close team supervision and 24 hour management of active medical problems listed below. Physiatrist and rehab team continue to assess barriers to discharge/monitor patient progress toward functional and medical goals  Care Tool:  Bathing    Body parts bathed by patient: Right arm, Left arm, Chest, Abdomen, Right upper leg, Left upper leg, Face, Buttocks, Front perineal area   Body parts bathed by helper: Right lower leg, Left lower leg     Bathing assist Assist Level: Moderate Assistance - Patient 50 - 74%     Upper Body Dressing/Undressing  Upper body dressing   What is the patient wearing?: Pull over shirt, Bra    Upper body assist Assist Level: Moderate Assistance - Patient 50 - 74%    Lower Body Dressing/Undressing Lower body dressing      What is the patient wearing?: Pants, Incontinence brief     Lower body assist Assist for lower body dressing: Maximal Assistance - Patient 25 - 49%     Toileting Toileting    Toileting assist Assist for toileting: Total Assistance - Patient < 25%     Transfers Chair/bed transfer  Transfers assist     Chair/bed transfer assist level: Maximal Assistance - Patient 25 - 49%      Locomotion Ambulation   Ambulation assist   Ambulation activity did not occur: Safety/medical concerns  Assist level: 2 helpers Assistive device: No Device Max distance: 4   Walk 10 feet activity   Assist  Walk 10 feet activity did not occur: Safety/medical concerns        Walk 50 feet activity   Assist Walk 50 feet with 2 turns activity did not occur: Safety/medical concerns         Walk 150 feet activity   Assist Walk 150 feet activity did not occur: Safety/medical concerns         Walk 10 feet on uneven surface  activity   Assist Walk 10 feet on uneven surfaces activity did not occur: Safety/medical concerns         Wheelchair     Assist Is the patient using a wheelchair?: Yes Type of Wheelchair: Manual    Wheelchair assist level: Minimal Assistance - Patient > 75% Max wheelchair distance: 20    Wheelchair 50 feet with 2 turns activity    Assist        Assist Level: Maximal Assistance - Patient 25 - 49% (Per PT documentation of 20 feet)   Wheelchair 150 feet activity     Assist      Assist Level: Total Assistance - Patient < 25% (Per PT documentation of 20 feet)   Blood pressure 124/69, pulse 74, temperature 97.6 F (36.4 C), temperature source Oral, resp. rate 20, height '5\' 6"'$  (1.676 m), weight 70.3 kg, SpO2 97 %.  Medical Problem List and Plan: 1. Functional deficits secondary to left thalamic intracranial hemorrhage             -patient may shower             -ELOS/Goals: 14-16 days, supervision to min assist  -Continue CIR therapies including PT, OT, and SLP  -Have reduced intensity to 15/7  -ongoing waxing/waning- likely multifactorial.    -minimize meds, maximize sleep   -expect improvement once she's home   -continue above 2.  Antithrombotics: -DVT/anticoagulation:  Pharmaceutical: Heparin             -antiplatelet therapy: none 3. Pain: continue Tylenol as needed. Neurontin 300 mg q HS--reduced to '200mg'$ ,  continue at this dose.   -xray of left shoulder demonstrates significant DJD, old femoral neck fx  -added voltaren gel to left shoulder  -rom to tolerance 4. Mood/sleep: team providing ego support             -antipsychotic agents: n/a  -family is providing  emotional support as well  -melatonin dc'ed 5. Neuropsych: This patient is not capable of making decisions on her own behalf. 6. Skin/Wound Care: Routine skin care checks 7. Fluids/Electrolytes/Nutrition:               --  encouraging PO  - intake reasonable  -albumin low, changed to Boost Breeze per pt preference  -labs ok today 8. Hypertension: continue diltiazem, hydralazine, nadolol. Home losartan held  -6/7 bp controlled. Holding nadolol prn for hypotension   -have decreased hydralazine to '25mg'$  , continue at this dose.  9: Hyperlipidemia: continue atorvastatin 10: GI prophylaxis: continue pantoprazole 11: pAF: continue diltiazem. No AC until resolution of ICH, outpt follow-up 12: Hypothyroidism: continue Synthroid 13: Urinary retention: continue Flomax             - 6/7 requiring an I/O caths at times still   -continue urecholine '10mg'$  tid--observe today  14: UTI:  Keflex completed  -  repeat UA neg, UCX neg also 15: AKI: BUN and Cr  have improved             -encouraging fluids.  16: Leukocytosis: wbc's normal.   17: Anemia, chronic: no evidence of acute blood loss -hgb stable at 8.8 6/5 18: Polymyalgia rheumatica: prednisone 2 mg with lunch 19. Dysphagia:               -advanced to reg/thins per SLP 20. Dry eye, blurry vision   Pt has macular degeneration of the right eye, uses preservision at home. - Conjunctiva appear more clear    Continue use of artifical tears and Prosight.    LOS: 15 days A FACE TO FACE EVALUATION WAS PERFORMED  Meredith Staggers 04/17/2022, 8:42 AM

## 2022-04-17 NOTE — Progress Notes (Signed)
Physical Therapy Session Note  Patient Details  Name: [name] MRN: [id number] Date of Birth: [birth year]  Today's Date: 04/17/2022 PT Individual Time: 3419-6222 and 1600-1630 PT Individual Time Calculation (min): 59 min and 30 min  Short Term Goals: Week 2:  PT Short Term Goal 1 (Week 2): Pt will complete bed mobility with minA. PT Short Term Goal 2 (Week 2): Pt will complete bed to chair transfer with minA consistently. PT Short Term Goal 3 (Week 2): Pt will perform sit to stand with minA consistently. PT Short Term Goal 4 (Week 2): Pt will ambulate x30' with modA +1 and LRAD.  Skilled Therapeutic Interventions/Progress Updates:     Pt received supine in bed and agrees to therapy. Reports pain in L upper arm but says pain is much improved from yesterday. Number not provided. PT provides rest breaks as needed to manage pain. Supine to sit with cues for sequencing and positioning at EOB. Stand pivot transfer to [facility] with modA and cues for body mechanics and hand placement. WC transport to gym for time management. Pt performs sit to stand to [place] with minA/modA and cues for hand placement, initiation, and anterior weight shift. First several steps completed with miNA but pt's R hand slips off RW and pt has difficulty progressing R lower extremity, requiring modA/maxA for remainder of stand step transfer to mat table. Pt performs standing activity for NMR. In standing, pt tasked with retrieving wooden pegs from L side of peg board and moving to R side of peg board, utilizing R upper extremity and with L hand support on RW. PT provides minA manual facilitation of trunk stability. Pt successfully retrieves and places 2 pegs but then struggles to retrieve 3rd peg, becoming frustrated and losing accuracy. PT cues for rest break and pt sits to recover. PT provides education on utilizing vision to provide feedback and improved coordination and accuracy of R upper extremity. On 2nd attempt, pt successfully  retrieves and places 7 pegs with PT providing minA stability at trunk. Following seated rest break, pt stands with modA and is able to successfully manipulate 9 pegs with PT providing minA at trunk. Pt attempts ambulation but has difficulty with advancing R lower extremity, so only ambulates about 2' prior to requiring guidance back into WC with maxA. Pt left seated in WC with alarm intact and all needs within reach.  2nd Session: Pt received seated in [facility] and agrees to therapy. No complaint of pain. WC transport to gym for time management. Pt states that she does not want to work on walking at this time. Pt performs stand pivot transfer to Nust[place] with modA and cues for sequencing and body mechanics. Pt completes nustep for strength and endurance training as well as reciprocal coordination. Pt completes 2x5:00 at workload of 4 with average steps per minute ~30. PT provides cues for full ROM and upper extremity placement and use of vision for NM feedback. Pt transfers from nustep to [facility] with maxA to the L. Extra assistance required due to pt pushing with L lower extremity. Pt left seated in WC with alarm intact and all needs within reach.  Therapy Documentation Precautions:  Precautions Precautions: Fall Precaution Comments: R hemi Restrictions Weight Bearing Restrictions: No    Therapy/Group: Individual Therapy  Breck Coons, PT, DPT 04/17/2022, 4:58 PM

## 2022-04-17 NOTE — Plan of Care (Signed)
Pt's plan of care adjusted to 15/7 after speaking with care team and discussed with MD in team conference as pt currently unable to tolerate current therapy schedule with OT, PT, and SLP.   

## 2022-04-17 NOTE — Progress Notes (Signed)
Occupational Therapy Session Note  Patient Details  Name: [name] MRN: [id number] Date of Birth: January 28, 1923  Today's Date: 04/17/2022 OT Individual Time: 1304-1400 OT Individual Time Calculation (min): 56 min    Short Term Goals: Week 3:  OT Short Term Goal 1 (Week 3): Pt will don pants with mod A OT Short Term Goal 2 (Week 3): Patient will perform toilet transfer with mod A of 1 and no Stedy OT Short Term Goal 3 (Week 3): Patient will complete 1 step of toileting task  Skilled Therapeutic Interventions/Progress Updates:    Pt received sitting up with her son visiting, excited to take shower and no c/o pain. She stood with mod A using the RW. Manual facilitation required for placement of the RUE. Functional mobility into the bathroom using the RW with mod A, frequent cueing/breaks required to cue pt on placement of the RUE and RLE. Very poor proprioception and awareness of deficits. Transfer to TTB with max A d/t difficulty sequencing turn. She completed UB bathing with (S), but dropping rag several times. Mod A to wash LB with lateral leans. She completed a stand pivot transfer to the w/c with mod A - again poor proprioception of the RUE, requiring max cueing to remove it from grab bar where it had become wedged. She completed hair care and oral care at the sink with min A d/t fatigue. UB dressing with mod A to don bra and shirt. She required max A to don pants. Pt was left sitting up with all needs met, chair alarm set.   Therapy Documentation Precautions:  Precautions Precautions: Fall Precaution Comments: R hemi Restrictions Weight Bearing Restrictions: No  Therapy/Group: Individual Therapy  Curtis Sites 04/17/2022, 1:42 PM

## 2022-04-18 NOTE — Progress Notes (Signed)
Occupational Therapy Session Note  Patient Details  Name: [name] MRN: [id number] Date of Birth: [birth year]  Today's Date: 04/18/2022 OT Individual Time: 0518-3358 OT Individual Time Calculation (min): 72 min    Short Term Goals: Week 3. LTG=STG 2/2 ELOS  Skilled Therapeutic Interventions/Progress Updates:    Pt greeted semi-reclined in bed, bright affect, and smiled when she saw OT. Pt stated she slept well and was easily able to recall this OT's name. Pt reported need to urinate. Worked on bed mobility with min A to push up into sitting. Sit<>stand w/ RW and multiple trials due to R side apraxia. Pt with poor body awareness requiring max cues to get R LE underneath her prior to stand. LLE kicked out in front of her when turning to transfer requiring max A to safely sit on commode. Pt then stood again with mod A and verbal+tactile cues for body awareness, then OT assist to pull down pants. Pt with continent void of VERY cloudy urine. OT will relay this to team. Worked on pt completing hygiene with min A. Stand-pivot to wc with mod A to the R without RW this time. Bathing/dressing at the sink with focus on hemi techniques and forced use of R UE within all tasks. Sit<>stands with min A when she is allowed to pull up at the sink, but mod A when pushing from wc. OT set pt up for breakfast. Pt able to manage food bolus with min cues for pocketing on R side. OT issued soft theraputty and educated on uses for R hand strength and coordination while OT blow dried and curled her hair. Pt left seated in wc with alarm belt on, call bell in reach, and needs met.   Therapy Documentation Precautions:  Precautions Precautions: Fall Precaution Comments: R hemi Restrictions Weight Bearing Restrictions: No Pain:  Denies pain  Therapy/Group: Individual Therapy  Valma Cava 04/18/2022, 8:51 AM

## 2022-04-18 NOTE — Progress Notes (Signed)
Speech Language Pathology Daily Session Note  Patient Details  Name: [name] MRN: [id number] Date of Birth: 23-Aug-1923  Today's Date: 04/18/2022 SLP Individual Time: 1000-1035 SLP Individual Time Calculation (min): 35 min  Short Term Goals: Week 3: SLP Short Term Goal 1 (Week 3): Pt will demonstrate 90% speech intelligibility sentence level with Mod I for over articulation and rate. SLP Short Term Goal 2 (Week 3): Pt will demonstrate word finding skills in higher level task (ex: divergent/convergent naming and abstract topics) with Mod I. SLP Short Term Goal 3 (Week 3): Pt will demonstrate mildly complex problem solving skills with supervision A verbal cues. SLP Short Term Goal 4 (Week 3): Pt will demonstrate recall of novel, daily information with Supevision A verbal cues for use of external aids.  Skilled Therapeutic Interventions: Skilled treatment session focused on cognitive-communication goals. SLP facilitated session by e-administering the [facility] Mental Status Examination (SLUMS). Patient scored  17/30 points with a score of 27 or above considered normal. Patient's scored improved by 7 points since initial assessment but patient continues to demonstrate deficits in generative naming and recall. Patient requested to transfer to the recliner at end of session. SLP utilized the stedy and patient demonstrated increased coordination of her RUE as well as improved strength to stand during use. Patient also noted improvement. Overall, patient pleased with both her cognitive and physical progress today. Patient left upright in the recliner with alarm on and all needs within reach. Continue with current plan of care.       Pain No/Denies Pain   Therapy/Group: Individual Therapy  Becky Colan 04/18/2022, 11:53 AM

## 2022-04-18 NOTE — Progress Notes (Addendum)
PROGRESS NOTE   Subjective/Complaints: Pt up in chair. Just finished with OT. Feeling well. Slept well. OT noticed cloudy urine with +/- odor. Left shoulder feels better  ROS: Patient denies fever, rash, sore throat, blurred vision, dizziness, nausea, vomiting, diarrhea, cough, shortness of breath or chest pain, joint or back/neck pain, headache, or mood change.     Objective:   DG Shoulder Left  Addendum Date: 04/16/2022   ADDENDUM REPORT: 04/16/2022 19:01 ADDENDUM: Dictation error in the impression section of the report, which should state: NO evidence of acute displaced fracture. Electronically Signed   By: Dahlia Bailiff M.D.   On: 04/16/2022 19:01   Result Date: 04/16/2022 CLINICAL DATA:  Left arm pain times a few days. EXAM: LEFT SHOULDER - 2+ VIEW COMPARISON:  Multiple priors including CT chest May 11, 2020. FINDINGS: There is no evidence of acute displaced fracture or dislocation. Chronic fracture deformity of the proximal humerus is unchanged from prior. 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 Chronic appearing left-sided rib fractures. IMPRESSION: Evidence of acute displaced fracture. Similar appearance of the chronic healed fracture deformity of the proximal humerus and severe degenerative change of the glenohumeral joint. Electronically Signed: By: Dahlia Bailiff M.D. On: 04/16/2022 11:20    No results for input(s): "WBC", "HGB", "HCT", "PLT" in the last 72 hours.    No results for input(s): "NA", "K", "CL", "CO2", "GLUCOSE", "BUN", "CREATININE", "CALCIUM" in the last 72 hours.     Intake/Output Summary (Last 24 hours) at 04/18/2022 0917 Last data filed at 04/18/2022 0730 Gross per 24 hour  Intake 600 ml  Output --  Net 600 ml        Physical Exam: Vital Signs Blood pressure (!) 153/64, pulse 74, temperature 98.1 F (36.7 C), temperature source Oral, resp. rate 20, height '5\' 6"'$   (1.676 m), weight 70.3 kg, SpO2 95 %.  Constitutional: No distress . Vital signs reviewed. HEENT: NCAT, EOMI, oral membranes moist Neck: supple Cardiovascular: RRR without murmur. No JVD    Respiratory/Chest: CTA Bilaterally without wheezes or rales. Normal effort    GI/Abdomen: BS +, non-tender, non-distended Ext: no clubbing, cyanosis, or edema Psych: pleasant and cooperative  Skin: Clean and intact without signs of breakdown Neuro:  alert oriented to person,hospital, reason. Much more focused and appropriate.  Follows commands. Language improving.. Minimal right central 7.  RUE 3-4/5 at least. RLE 3 to 3+/5. Sensation intact for pain and LT Musculoskeletal: left shoulder still restricted in PROM but moving with less pain.     Assessment/Plan: 1. Functional deficits which require 3+ hours per day of interdisciplinary therapy in a comprehensive inpatient rehab setting. Physiatrist is providing close team supervision and 24 hour management of active medical problems listed below. Physiatrist and rehab team continue to assess barriers to discharge/monitor patient progress toward functional and medical goals  Care Tool:  Bathing    Body parts bathed by patient: Right arm, Left arm, Chest, Abdomen, Right upper leg, Left upper leg, Face, Buttocks, Front perineal area   Body parts bathed by helper: Right lower leg, Left lower leg     Bathing assist Assist Level: Moderate Assistance - Patient 50 -  74%     Upper Body Dressing/Undressing Upper body dressing   What is the patient wearing?: Pull over shirt, Bra    Upper body assist Assist Level: Moderate Assistance - Patient 50 - 74%    Lower Body Dressing/Undressing Lower body dressing      What is the patient wearing?: Pants, Incontinence brief     Lower body assist Assist for lower body dressing: Maximal Assistance - Patient 25 - 49%     Toileting Toileting    Toileting assist Assist for toileting: Total Assistance -  Patient < 25%     Transfers Chair/bed transfer  Transfers assist     Chair/bed transfer assist level: Maximal Assistance - Patient 25 - 49%     Locomotion Ambulation   Ambulation assist   Ambulation activity did not occur: Safety/medical concerns  Assist level: 2 helpers Assistive device: No Device Max distance: 4   Walk 10 feet activity   Assist  Walk 10 feet activity did not occur: Safety/medical concerns        Walk 50 feet activity   Assist Walk 50 feet with 2 turns activity did not occur: Safety/medical concerns         Walk 150 feet activity   Assist Walk 150 feet activity did not occur: Safety/medical concerns         Walk 10 feet on uneven surface  activity   Assist Walk 10 feet on uneven surfaces activity did not occur: Safety/medical concerns         Wheelchair     Assist Is the patient using a wheelchair?: Yes Type of Wheelchair: Manual    Wheelchair assist level: Minimal Assistance - Patient > 75% Max wheelchair distance: 20    Wheelchair 50 feet with 2 turns activity    Assist        Assist Level: Maximal Assistance - Patient 25 - 49% (Per PT documentation of 20 feet)   Wheelchair 150 feet activity     Assist      Assist Level: Total Assistance - Patient < 25% (Per PT documentation of 20 feet)   Blood pressure (!) 153/64, pulse 74, temperature 98.1 F (36.7 C), temperature source Oral, resp. rate 20, height '5\' 6"'$  (1.676 m), weight 70.3 kg, SpO2 95 %.  Medical Problem List and Plan: 1. Functional deficits secondary to left thalamic intracranial hemorrhage             -patient may shower             -ELOS/Goals: 14-16 days, supervision to min assist  -Continue CIR therapies including PT, OT, and SLP  -Have reduced intensity to 15/7  -ongoing waxing/waning- likely multifactorial.    -minimize meds, maximize sleep   -has shown improvement/more consistency this week 2.   Antithrombotics: -DVT/anticoagulation:  Pharmaceutical: Heparin             -antiplatelet therapy: none 3. Pain: continue Tylenol as needed. Neurontin 300 mg q HS--reduced to '200mg'$ , continue at this dose.   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 Mood/sleep: team providing ego support             -antipsychotic agents: n/a  -family is providing  emotional support as well  -melatonin dc'ed d/t questionable side effects 5. Neuropsych: This patient is not capable of making decisions on her own behalf.  6. Skin/Wound Care: Routine skin care checks 7. Fluids/Electrolytes/Nutrition:               --encouraging PO  - intake reasonable  -albumin low,  Boost Breeze per pt preference 8. Hypertension: continue diltiazem, hydralazine, nadolol. Home losartan held  -6/8 bp controlled. Holding nadolol prn for hypotension   -have decreased hydralazine to '25mg'$  , continue at this dose as long as bp holds.  9: Hyperlipidemia: continue atorvastatin 10: GI prophylaxis: continue pantoprazole 11: pAF: continue diltiazem. No AC until resolution of ICH, outpt follow-up 12: Hypothyroidism: continue Synthroid 13: Urinary retention: continue Flomax             - 6/8 no I/O caths since 6/6   -continue urecholine '10mg'$  tid--observe today  14: UTI:  Keflex completed  -  repeat UA neg, UCX neg also  -6/8 urine cloudy again. Recheck ucx 15: AKI: BUN and Cr  have improved             -encouraging fluids.  16: Leukocytosis: wbc's normal.   17: Anemia, chronic: no evidence of acute blood loss -hgb stable at 8.8 6/5 18: Polymyalgia rheumatica: prednisone 2 mg with lunch 19. Dysphagia:               -advanced to reg/thins per SLP 20. Dry eye, blurry vision   Pt has macular degeneration of the right eye, uses preservision at home. - Conjunctiva appear more  clear    Continue use of artifical tears and Prosight.    LOS: 16 days A FACE TO FACE EVALUATION WAS PERFORMED  Meredith Staggers 04/18/2022, 9:17 AM

## 2022-04-18 NOTE — Progress Notes (Signed)
Physical Therapy Session Note  Patient Details  Name: [name] MRN: [id number] Date of Birth: [birth year]  Today's Date: 04/18/2022 PT Individual Time: 1301-1329 PT Individual Time Calculation (min): 28 min   Short Term Goals: Week 2:  PT Short Term Goal 1 (Week 2): Pt will complete bed mobility with minA. PT Short Term Goal 2 (Week 2): Pt will complete bed to chair transfer with minA consistently. PT Short Term Goal 3 (Week 2): Pt will perform sit to stand with minA consistently. PT Short Term Goal 4 (Week 2): Pt will ambulate x30' with modA +1 and LRAD.  Skilled Therapeutic Interventions/Progress Updates:     Pt received seated in recliner and agrees to therapy. Reports pain in L arm, but says it is improved from yesterday. Number not provided. PT provides rest breaks as needed to manage pain. Pt perform stand pivot transfer to WC to the L with modA and cues for body mechanics and sequencing. WC transport to gym for time management. Pt completes sit to stand to RW with light modA and cues for R hand placement on RW and L hand placement with hand over hand manual overpressure to facilitate push off WC for optimal sequencing. In standing, pt works to achieve neutral posture with midline positioning. Pt able to maintain static standing primarily with CGA and one instance of requiring modA with fatigue and and R lateral drift. Pt takes seated rest break. Pt then ambulates with RW x35' with modA +1 and +2 for WC follow for safety. Pt has improving reciprocal gait pattern and PT provides multimodal cues for upright posture, lateral weight shifting, swing through gait pattern, and RW management.   Pt performs stand pivot back to recliner with modA and cues for body mechanics, posture, sequencing, and positioning. Left with alarm intact and all needs within reach.  Therapy Documentation Precautions:  Precautions Precautions: Fall Precaution Comments: R hemi Restrictions Weight Bearing  Restrictions: No   Therapy/Group: Individual Therapy  Breck Coons, PT, DPT 04/18/2022, 4:47 PM

## 2022-04-19 MED ORDER — GABAPENTIN 300 MG PO CAPS
300.0000 mg | ORAL_CAPSULE | Freq: Every day | ORAL | Status: DC
  Administered 2022-04-19: 300 mg via ORAL
  Filled 2022-04-19: qty 1

## 2022-04-19 NOTE — Progress Notes (Signed)
Occupational Therapy Session Note  Patient Details  Name: [name] MRN: [id number] Date of Birth: 09-05-1923  Today's Date: 04/19/2022 OT Individual Time: 1027-2536 OT Individual Time Calculation (min): 58 min   Short Term Goals: Week 2:  OT Short Term Goal 1 (Week 2): Pt will don a shirt with no more than min cueing for hemi technique and min A OT Short Term Goal 1 - Progress (Week 2): Met OT Short Term Goal 2 (Week 2): Pt will don pants with mod A OT Short Term Goal 2 - Progress (Week 2): Progressing toward goal OT Short Term Goal 3 (Week 2): Pt will maintain sitting balance EOB during ADLs with no more than CGA OT Short Term Goal 3 - Progress (Week 2): Met OT Short Term Goal 4 (Week 2): Patient will complete sit<>stand at the sink with mod A of 1 during BADL task without Stedy OT Short Term Goal 4 - Progress (Week 2): Met  Skilled Therapeutic Interventions/Progress Updates:    Pt greeted semi-reclined in bed and agreeable to OT treatment session. Pt reported she did not sleep well last night due to her leg throbbing. Pt reported that MD may adjust her nerve pain medication. Worked on bed mobility pushing up with R UE to get to sidelying with min A today. Achieved balance at EOB with CGA progressing to supervision. Squat-pivot transfer using Bobath method with mod A. Worked on stand-pivot tran sfer to [facility] over toilet with use of grab bars and mod A. Verbal cues tactile cues for R LE positioning during transfer and facilitation for anterior weight shift. Pt with successful void and successful BM. Stedy then used for sit<>stand with min A. Pt able to reach behind and assist with posterior peri-care, but OT assist for thoroughness. Pt brought to wc in [place]. Bathing/dressing tasks  from wc at the sink. Pt with good carryover of hemi dressing techniques. Pt able to get B LE's into figure 4 position and assist with threading pant legs today with min A. Min A for sit<>stand, then pt able to help  pull pants over hips with min A for balance. Continued working on standing with standing grooming tasks. Pt able to grasp toothbrush with R hand today and bring to mouth. Started brushing teeth, but needed to switch to L hand for thoroughness. Stand-pivot to recliner with mod A. Pt left seated in recliner with alarm belt on, call bell in reach, and needs met.   Therapy Documentation Precautions:  Precautions Precautions: Fall Precaution Comments: R hemi Restrictions Weight Bearing Restrictions: No Pain:  Denies pain   Therapy/Group: Individual Therapy  Valma Cava 04/19/2022, 3:26 PM

## 2022-04-19 NOTE — Progress Notes (Signed)
Speech Language Pathology Daily Session Note  Patient Details  Name: [name] MRN: [id number] Date of Birth: [birth year]  Today's Date: 04/19/2022 SLP Individual Time: 1010-1055 SLP Individual Time Calculation (min): 45 min  Short Term Goals: Week 3: SLP Short Term Goal 1 (Week 3): Pt will demonstrate 90% speech intelligibility sentence level with Mod I for over articulation and rate. SLP Short Term Goal 2 (Week 3): Pt will demonstrate word finding skills in higher level task (ex: divergent/convergent naming and abstract topics) with Mod I. SLP Short Term Goal 3 (Week 3): Pt will demonstrate mildly complex problem solving skills with supervision A verbal cues. SLP Short Term Goal 4 (Week 3): Pt will demonstrate recall of novel, daily information with Supevision A verbal cues for use of external aids.  Skilled Therapeutic Interventions: Skilled treatment session focused on cognitive goals. SLP facilitated session by providing patient with a calendar to maximize recall of orientation information. Patient reports that she write information down at home at baseline, therefore, patient was provided a memory notebook for recall and carryover of daily, functional information. Patient able to recall morning events with supervision level verbal cues with SLP making note of progress noted in each session. SLP had to record the information due to decreased coordination of her RUE. SLP also provided Mod-Max A verbal and visual cues to assist in patient recalling names of her family members. External aids was created and left with patient to help family complete. Patient left upright in the recliner with alarm on and all needs within reach. Continue with current plan of care.      Pain No/Denies Pain   Therapy/Group: Individual Therapy  [name] 04/19/2022, 4:14 PM

## 2022-04-19 NOTE — Progress Notes (Signed)
PROGRESS NOTE   Subjective/Complaints: C/o right leg pain last night, kept her awake somewhat. Describes it as throbbing and below the knee. No other complaints this morning.  ROS: Patient denies fever, rash, sore throat, blurred vision, dizziness, nausea, vomiting, diarrhea, cough, shortness of breath or chest pain,   back/neck pain, headache, or mood change.     Objective:   No results found.  No results for input(s): "WBC", "HGB", "HCT", "PLT" in the last 72 hours.    No results for input(s): "NA", "K", "CL", "CO2", "GLUCOSE", "BUN", "CREATININE", "CALCIUM" in the last 72 hours.     Intake/Output Summary (Last 24 hours) at 04/19/2022 0922 Last data filed at 04/19/2022 0753 Gross per 24 hour  Intake 758 ml  Output 850 ml  Net -92 ml        Physical Exam: Vital Signs Blood pressure (!) 110/57, pulse (!) 59, temperature 98 F (36.7 C), temperature source Oral, resp. rate (!) 21, height '5\' 6"'$  (1.676 m), weight 70.3 kg, SpO2 97 %.  Constitutional: No distress . Vital signs reviewed. HEENT: NCAT, EOMI, oral membranes moist Neck: supple Cardiovascular: RRR without murmur. No JVD    Respiratory/Chest: CTA Bilaterally without wheezes or rales. Normal effort    GI/Abdomen: BS +, non-tender, non-distended Ext: no clubbing, cyanosis, or edema Psych: pleasant and cooperative   Skin: Clean and intact without signs of breakdown Neuro:  alert oriented to person,hospital, reason. Much more focused and appropriate.  Follows commands. Language improving.. Minimal right central 7.  RUE 3-4/5 at least. RLE 3 to 3+/5. Sensation intact for pain and LT Musculoskeletal: left shoulder less tender. No frank tenderness with palpation of RLE.     Assessment/Plan: 1. Functional deficits which require 3+ hours per day of interdisciplinary therapy in a comprehensive inpatient rehab setting. Physiatrist is providing close team supervision  and 24 hour management of active medical problems listed below. Physiatrist and rehab team continue to assess barriers to discharge/monitor patient progress toward functional and medical goals  Care Tool:  Bathing    Body parts bathed by patient: Right arm, Left arm, Chest, Abdomen, Right upper leg, Left upper leg, Face, Buttocks, Front perineal area   Body parts bathed by helper: Right lower leg, Left lower leg     Bathing assist Assist Level: Moderate Assistance - Patient 50 - 74%     Upper Body Dressing/Undressing Upper body dressing   What is the patient wearing?: Pull over shirt, Bra    Upper body assist Assist Level: Moderate Assistance - Patient 50 - 74%    Lower Body Dressing/Undressing Lower body dressing      What is the patient wearing?: Pants, Incontinence brief     Lower body assist Assist for lower body dressing: Maximal Assistance - Patient 25 - 49%     Toileting Toileting    Toileting assist Assist for toileting: Total Assistance - Patient < 25%     Transfers Chair/bed transfer  Transfers assist     Chair/bed transfer assist level: Maximal Assistance - Patient 25 - 49%     Locomotion Ambulation   Ambulation assist   Ambulation activity did not occur: Safety/medical concerns  Assist level:  2 helpers Assistive device: No Device Max distance: 4   Walk 10 feet activity   Assist  Walk 10 feet activity did not occur: Safety/medical concerns        Walk 50 feet activity   Assist Walk 50 feet with 2 turns activity did not occur: Safety/medical concerns         Walk 150 feet activity   Assist Walk 150 feet activity did not occur: Safety/medical concerns         Walk 10 feet on uneven surface  activity   Assist Walk 10 feet on uneven surfaces activity did not occur: Safety/medical concerns         Wheelchair     Assist Is the patient using a wheelchair?: Yes Type of Wheelchair: Manual    Wheelchair assist  level: Minimal Assistance - Patient > 75% Max wheelchair distance: 20    Wheelchair 50 feet with 2 turns activity    Assist        Assist Level: Maximal Assistance - Patient 25 - 49% (Per PT documentation of 20 feet)   Wheelchair 150 feet activity     Assist      Assist Level: Total Assistance - Patient < 25% (Per PT documentation of 20 feet)   Blood pressure (!) 110/57, pulse (!) 59, temperature 98 F (36.7 C), temperature source Oral, resp. rate (!) 21, height '5\' 6"'$  (1.676 m), weight 70.3 kg, SpO2 97 %.  Medical Problem List and Plan: 1. Functional deficits secondary to left thalamic intracranial hemorrhage             -patient may shower             -ELOS/Goals: 14-16 days, supervision to min assist  -Continue CIR therapies including PT, OT, and SLP  -Have reduced intensity to 15/7  - waxing/waning mental status   -has had a good week   -minimize meds, maximize sleep    2.  Antithrombotics: -DVT/anticoagulation:  Pharmaceutical: Heparin             -antiplatelet therapy: none 3. Pain: continue Tylenol as needed.    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 Was using at home. Also will offer kpad 4. Mood/sleep: team providing ego support             -antipsychotic agents: n/a  -family is providing  emotional support as well  -melatonin dc'ed d/t questionable side effects 5. Neuropsych: This patient is not capable of making decisions on her own behalf. 6. Skin/Wound Care: Routine skin care checks 7. Fluids/Electrolytes/Nutrition:               --encouraging PO  - intake reasonable  -  Boost Breeze to improve protein 8. Hypertension: continue diltiazem, hydralazine, nadolol. Home losartan held  -6/9 bp controlled. Holding nadolol prn for  hypotension   -have decreased hydralazine to '25mg'$  , continue at this dose as long as bp holds. Might be able to decrease further 9: Hyperlipidemia: continue atorvastatin 10: GI prophylaxis: continue pantoprazole 11: pAF: continue diltiazem. No AC until resolution of ICH, outpt follow-up 12: Hypothyroidism: continue Synthroid 13: Urinary retention: continue Flomax             - 6/9 no I/O caths since 6/6   -continue urecholine '10mg'$  tid--observe today  14: UTI:  Keflex completed  -  repeat UA neg, UCX neg also  -6/9 urine still cloudy. repeat ucx pending 15: AKI: BUN and Cr  have improved             -encouraging fluids.  16: Leukocytosis: wbc's normal.   17: Anemia, chronic: no evidence of acute blood loss -hgb stable at 8.8 6/5 18: Polymyalgia rheumatica: prednisone 2 mg with lunch 19. Dysphagia:               -advanced to reg/thins per SLP 20. macular degeneration of the right eye, uses preservision at home. - improved  Continue use of artifical tears and Prosight.    LOS: 17 days A FACE TO [place] 04/19/2022, 9:22 AM

## 2022-04-20 MED ORDER — GABAPENTIN 100 MG PO CAPS
200.0000 mg | ORAL_CAPSULE | Freq: Every day | ORAL | Status: DC
  Administered 2022-04-20 – 2022-04-29 (×10): 200 mg via ORAL
  Filled 2022-04-20 (×10): qty 2

## 2022-04-20 MED ORDER — ACETAMINOPHEN 325 MG PO TABS
650.0000 mg | ORAL_TABLET | Freq: Every day | ORAL | Status: DC
  Administered 2022-04-20 – 2022-04-29 (×10): 650 mg via ORAL
  Filled 2022-04-20 (×10): qty 2

## 2022-04-20 NOTE — Progress Notes (Signed)
Physical Therapy Session Note  Patient Details  Name: [name] MRN: [id number] Date of Birth: June 02, 1923  Today's Date: 04/20/2022 PT Individual Time: 1505-1610 PT Individual Time Calculation (min): 65 min   Short Term Goals: Week 1:  PT Short Term Goal 1 (Week 1): pt will roll side to side w/ CGA. PT Short Term Goal 1 - Progress (Week 1): Met PT Short Term Goal 2 (Week 1): Pt will transfer sup/sidelying to sit w/ min A PT Short Term Goal 2 - Progress (Week 1): Met PT Short Term Goal 3 (Week 1): Pt will transfer sit to stand w/ mod A PT Short Term Goal 3 - Progress (Week 1): Met PT Short Term Goal 4 (Week 1): Pt will perform SPT w/ mod A PT Short Term Goal 4 - Progress (Week 1): Met Week 2:  PT Short Term Goal 1 (Week 2): Pt will complete bed mobility with minA. PT Short Term Goal 2 (Week 2): Pt will complete bed to chair transfer with minA consistently. PT Short Term Goal 3 (Week 2): Pt will perform sit to stand with minA consistently. PT Short Term Goal 4 (Week 2): Pt will ambulate x30' with modA +1 and LRAD.     Skilled Therapeutic Interventions/Progress Updates:   Pt seated in wc; dtr Jana Half present.  Pt denied "real pain, but my R Leg hurts a little most of the time."  Jana Half pointed out bil LE edema; pt did not have on TEDs.  PT donned thigh high TEDS with pt in sitting and standing iwht bil UE support. Pt donned slip on shoes with max assist.  Pt and family ed regarding edema mgt, and about proper shoes for fit and support; dtr will look for lace up walking shoes.  Timed toileting to [facility] over toilet; mod ssist stand pivot.  Pt voided; she stated that it burned a little bit.  Urine noted to be cloudy; this information passed to [place], Therapist, sports.  Peri care with set up.  Total assist for clothing mgt.  Hand washing from wc level, min assist.  Gait training iwht weighted grocery cart, mod assist, x 30' x 2.  ACE wrap on R knee for pt confidence.  Pt reported that her R knee would  buckle rarely, before CVA.  Therapeutic activity while resting in sitting from gait training: use of R hand to manipulate light weight clothes pins x 8 with extra time.  Pt is very motivated to stay busy during rest breaks. Wc propulsion via bil LEs only, ( forced use of RLE) x 25' with min assist. With practice, pt's stepping pattern with R foot improved.   Wc> recliner mod assist stand pivot with RW; R hand placed in middle of bar in front, prior to standing.    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 She is very discouraged about her slow progress; PT provided emotional support.  At end of session, pt reclined in recliner with bil LEs elevated for edema control.  Seat belt alarm set and needs at hand.     Therapy Documentation Precautions:  Precautions Precautions: Fall Precaution Comments: R hemi Restrictions Weight Bearing Restrictions: No       Therapy/Group: Individual Therapy  Rebekka Lobello 04/20/2022, 4:34 PM

## 2022-04-20 NOTE — Progress Notes (Signed)
Speech Language Pathology Daily Session Note  Patient Details  Name: [name] MRN: [id number] Date of Birth: Feb 13, 1923  Today's Date: 04/20/2022 SLP Individual Time: 0720-0815 SLP Individual Time Calculation (min): 55 min  Short Term Goals: Week 3: SLP Short Term Goal 1 (Week 3): Pt will demonstrate 90% speech intelligibility sentence level with Mod I for over articulation and rate. SLP Short Term Goal 2 (Week 3): Pt will demonstrate word finding skills in higher level task (ex: divergent/convergent naming and abstract topics) with Mod I. SLP Short Term Goal 3 (Week 3): Pt will demonstrate mildly complex problem solving skills with supervision A verbal cues. SLP Short Term Goal 4 (Week 3): Pt will demonstrate recall of novel, daily information with Supevision A verbal cues for use of external aids.  Skilled Therapeutic Interventions: Skilled treatment session focused on cognitive-linguistic goals. Upon arrival, patient was awake in bed and was mildly anxious about having therapy "so early." SLP provided encouragement and reassurance. Patient requested to stay in bed and consumed her breakfast meal of regular textures with thin liquids without overt s/s of aspiration. Recommend patient continue current diet. Throughout meal, patient was able to alternate attention between self-feeding and a functional conversation that focused on recall of events from previous day with overall supervision level verbal cues. SLP facilitated session by providing extra time and overall Mod A verbal and visual cues for problem solving during a 6-step picture sequencing task that focused on utilization of her RUE. Patient also verbalized details about the pictures with overall Mod I for word-finding with supervision verbal cues for specificity. Patient requested to get into the recliner at end of session and transferred via the Stedy +1. Patient left with alarm on and all needs within reach. Continue with current plan  of care.      Pain Pain Assessment Pain Scale: 0-10 Pain Score: 0-No pain  Therapy/Group: Individual Therapy  Francess Mullen 04/20/2022, 11:57 AM

## 2022-04-20 NOTE — Progress Notes (Signed)
PROGRESS NOTE   Subjective/Complaints: Had a pretty good night. Was sleeping when I came in. No new issues yet today  ROS: Patient denies fever, rash, sore throat, blurred vision, dizziness, nausea, vomiting, diarrhea, cough, shortness of breath or chest pain, joint or back/neck pain, headache, or mood change.    Objective:   No results found.  No results for input(s): "WBC", "HGB", "HCT", "PLT" in the last 72 hours.    No results for input(s): "NA", "K", "CL", "CO2", "GLUCOSE", "BUN", "CREATININE", "CALCIUM" in the last 72 hours.     Intake/Output Summary (Last 24 hours) at 04/20/2022 0824 Last data filed at 04/20/2022 0645 Gross per 24 hour  Intake 234 ml  Output 4 ml  Net 230 ml        Physical Exam: Vital Signs Blood pressure (!) 143/74, pulse 60, temperature 98.7 F (37.1 C), temperature source Oral, resp. rate 16, height '5\' 6"'$  (1.676 m), weight 70.3 kg, SpO2 97 %.  Constitutional: No distress . Vital signs reviewed. HEENT: NCAT, EOMI, oral membranes moist Neck: supple Cardiovascular: RRR without murmur. No JVD    Respiratory/Chest: CTA Bilaterally without wheezes or rales. Normal effort    GI/Abdomen: BS +, non-tender, non-distended Ext: no clubbing, cyanosis, or edema Psych: pleasant and cooperative, less anxious Skin: Clean and intact without signs of breakdown Neuro:  alert oriented to person,hospital, reason. Much more focused and appropriate.  Follows commands. Language improving.. Minimal right central 7.  RUE 3-4/5 at least. RLE 3 to 3+/5. Sensation intact for pain and LT Musculoskeletal: left shoulder less tender. No leg pain    Assessment/Plan: 1. Functional deficits which require 3+ hours per day of interdisciplinary therapy in a comprehensive inpatient rehab setting. Physiatrist is providing close team supervision and 24 hour management of active medical problems listed below. Physiatrist and  rehab team continue to assess barriers to discharge/monitor patient progress toward functional and medical goals  Care Tool:  Bathing    Body parts bathed by patient: Right arm, Left arm, Chest, Abdomen, Right upper leg, Left upper leg, Face, Buttocks, Front perineal area   Body parts bathed by helper: Right lower leg, Left lower leg     Bathing assist Assist Level: Moderate Assistance - Patient 50 - 74%     Upper Body Dressing/Undressing Upper body dressing   What is the patient wearing?: Pull over shirt, Bra    Upper body assist Assist Level: Moderate Assistance - Patient 50 - 74%    Lower Body Dressing/Undressing Lower body dressing      What is the patient wearing?: Pants, Incontinence brief     Lower body assist Assist for lower body dressing: Maximal Assistance - Patient 25 - 49%     Toileting Toileting    Toileting assist Assist for toileting: Total Assistance - Patient < 25%     Transfers Chair/bed transfer  Transfers assist     Chair/bed transfer assist level: Maximal Assistance - Patient 25 - 49%     Locomotion Ambulation   Ambulation assist   Ambulation activity did not occur: Safety/medical concerns  Assist level: 2 helpers Assistive device: No Device Max distance: 4   Walk 10 feet activity  Assist  Walk 10 feet activity did not occur: Safety/medical concerns        Walk 50 feet activity   Assist Walk 50 feet with 2 turns activity did not occur: Safety/medical concerns         Walk 150 feet activity   Assist Walk 150 feet activity did not occur: Safety/medical concerns         Walk 10 feet on uneven surface  activity   Assist Walk 10 feet on uneven surfaces activity did not occur: Safety/medical concerns         Wheelchair     Assist Is the patient using a wheelchair?: Yes Type of Wheelchair: Manual    Wheelchair assist level: Minimal Assistance - Patient > 75% Max wheelchair distance: 20     Wheelchair 50 feet with 2 turns activity    Assist        Assist Level: Maximal Assistance - Patient 25 - 49% (Per PT documentation of 20 feet)   Wheelchair 150 feet activity     Assist      Assist Level: Total Assistance - Patient < 25% (Per PT documentation of 20 feet)   Blood pressure (!) 143/74, pulse 60, temperature 98.7 F (37.1 C), temperature source Oral, resp. rate 16, height '5\' 6"'$  (1.676 m), weight 70.3 kg, SpO2 97 %.  Medical Problem List and Plan: 1. Functional deficits secondary to left thalamic intracranial hemorrhage             -patient may shower             -ELOS/Goals: 14-16 days, supervision to min assist  -Continue CIR therapies including PT, OT, and SLP  -Have reduced intensity to 15/7  - mental status improving   -minimizing neurosedating meds, maximize sleep    2.  Antithrombotics: -DVT/anticoagulation:  Pharmaceutical: Heparin             -antiplatelet therapy: none 3. Pain: continue Tylenol as needed.    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 Was using at home. Also will offer kpad 4. Mood/sleep: team providing ego support             -antipsychotic agents: n/a  -family is providing  emotional support as well  -melatonin dc'ed d/t questionable side effects 5. Neuropsych: This patient is not capable of making decisions on her own behalf. 6. Skin/Wound Care: Routine skin care checks 7. Fluids/Electrolytes/Nutrition:               --encouraging PO  - intake reasonable  -  Boost Breeze to improve protein 8. Hypertension: continue diltiazem, hydralazine, nadolol. Home losartan held  -6/10 bp controlled. Holding nadolol prn for hypotension   -have decreased hydralazine to '25mg'$  , continue at this dose as long as bp holds.   9:  Hyperlipidemia: continue atorvastatin 10: GI prophylaxis: continue pantoprazole 11: pAF: continue diltiazem. No AC until resolution of ICH, outpt follow-up 12: Hypothyroidism: continue Synthroid 13: Urinary retention: continue Flomax             - now voiding   -continue urecholine '10mg'$  tid  14: UTI:  Keflex completed  -  repeat UA neg, UCX neg also  -6/10 repeat ucx still pending 15: AKI: BUN and Cr  have improved             -  encouraging fluids.  16: Leukocytosis: wbc's normal.   17: Anemia, chronic: no evidence of acute blood loss -hgb stable at 8.8 6/5 18: Polymyalgia rheumatica: prednisone 2 mg with lunch 19. Dysphagia:               -advanced to reg/thins per SLP 20. macular degeneration of the right eye, uses preservision at home. - improved  Continue use of artifical tears and Prosight.    LOS: 18 days A FACE TO FACE EVALUATION WAS PERFORMED  Meredith Staggers 04/20/2022, 8:24 AM

## 2022-04-20 NOTE — Progress Notes (Signed)
Occupational Therapy Session Note  Patient Details  Name: [name] MRN: [id number] Date of Birth: 16-Nov-1922  Today's Date: 04/20/2022 OT Individual Time: 1115-1200 OT Individual Time Calculation (min): 45 min    Short Term Goals: Week 3:  OT Short Term Goal 1 (Week 3): Pt will don pants with mod A OT Short Term Goal 2 (Week 3): Patient will perform toilet transfer with mod A of 1 and no Stedy OT Short Term Goal 3 (Week 3): Patient will complete 1 step of toileting task  Therapy Documentation Precautions:  Precautions Precautions: Fall Precaution Comments: R hemi Restrictions Weight Bearing Restrictions: No General:   Upon OT arrival, pt seated in recliner and states "You're here to do a bath with me?". Explained role of OT and pt agreeable to OT session. Pt reports 3-4/10 pain to the R LE. Pt completes stand pivot transfer to the L side with Mod A. Therapist transported pt to the sink and completes sponge bath at the levels listed below. Pt noted to demonstrate inattention to the R side, requiring verbal cues for awareness to the R UE during bathing and dressing tasks. Therapist occasionally provided manual assist to the RUE to reduce skin abrasions. Min verbal cues required to sequence ADL tasks. Pt requesting assistance from this therapist during LB bathing/dressing and footwear tasks as she plans to receive assist from caregivers at home but pt did attempt to do as much as she could this session. Pt noted to have decreased endurance and activity tolerance after completing UB bathing/dressing and grooming. Pt with increased R lateral lean during standing and requires verbal and tactile cues during transfers and standing for the R UE placement. Pt demonstrates carryover of hemi dressing technique during session without cuing. Pt noted to drop washcloth, deodorant, soap, clothing during session secondary to inattention, decreased gross/fine motor coordination, decreased visual  perception. Pt making progress towards stated OT goals and continues to benefit from OT services to achieve highest level of functional independence. Pt left in w/c with all safety measures met and pt's niece present.  ADL Eating: Supervision/safety Where Assessed-Eating: Bed level Grooming: Minimal assistance, Minimal cueing Where Assessed-Grooming: Sitting at sink Upper Body Bathing: Supervision/safety Where Assessed-Upper Body Bathing: Sitting at sink Lower Body Bathing: Moderate assistance Where Assessed-Lower Body Bathing: Standing at sink, Sitting at sink Upper Body Dressing: Minimal assistance Where Assessed-Upper Body Dressing: Standing at sink Lower Body Dressing: Moderate assistance Where Assessed-Lower Body Dressing: Standing at sink, Sitting at sink Toileting: Maximal assistance   Therapy/Group: Individual Therapy  Marvetta Gibbons 04/20/2022, 2:25 PM

## 2022-04-20 NOTE — Plan of Care (Signed)
  Problem: Consults Goal: RH STROKE PATIENT EDUCATION Description: See Patient Education module for education specifics  Outcome: Progressing   Problem: RH BOWEL ELIMINATION Goal: RH STG MANAGE BOWEL WITH ASSISTANCE Description: STG Manage Bowel with [place]. Outcome: Progressing Goal: RH STG MANAGE BOWEL W/MEDICATION W/ASSISTANCE Description: STG Manage Bowel with Medication with [place]. Outcome: Progressing   Problem: RH BLADDER ELIMINATION Goal: RH STG MANAGE BLADDER WITH ASSISTANCE Description: STG Manage Bladder With Min Assistance Outcome: Progressing Goal: RH STG MANAGE BLADDER WITH MEDICATION WITH ASSISTANCE Description: STG Manage Bladder With Medication With [place]. Outcome: Progressing   Problem: RH SKIN INTEGRITY Goal: RH STG MAINTAIN SKIN INTEGRITY WITH ASSISTANCE Description: STG Maintain Skin Integrity With [place]. Outcome: Progressing Goal: RH STG ABLE TO PERFORM INCISION/WOUND CARE W/ASSISTANCE Description: STG Able To Perform Incision/Wound Care With [employer]. Outcome: Progressing   Problem: RH SAFETY Goal: RH STG ADHERE TO SAFETY PRECAUTIONS W/ASSISTANCE/DEVICE Description: STG Adhere to Safety Precautions With Cues and Reminders. Outcome: Progressing Goal: RH STG DECREASED RISK OF FALL WITH ASSISTANCE Description: STG Decreased Risk of Fall With [employer]. Outcome: Progressing   Problem: RH COGNITION-NURSING Goal: RH STG USES MEMORY AIDS/STRATEGIES W/ASSIST TO PROBLEM SOLVE Description: STG Uses Memory Aids/Strategies With Min Assistance to Problem Solve. Outcome: Progressing Goal: RH STG ANTICIPATES NEEDS/CALLS FOR ASSIST W/ASSIST/CUES Description: STG Anticipates Needs/Calls for Assist With Cues and Reminders. Outcome: Progressing   Problem: RH KNOWLEDGE DEFICIT Goal: RH STG INCREASE KNOWLEDGE OF HYPERTENSION Description: Patient and caregivers to demonstrate knowledge of HTN medication management, dietary  recommendations, BP parameters with educational materials and handouts provided by staff with min assist at discharge. Outcome: Progressing Goal: RH STG INCREASE KNOWLEDGE OF DYSPHAGIA/FLUID INTAKE Description: Patient and caregivers to demonstrate knowledge of dysphagia diets, safe swallowing techniques with educational materials and handouts provided by staff with min assist at discharge. Outcome: Progressing Goal: RH STG INCREASE KNOWLEGDE OF HYPERLIPIDEMIA Description: Patient and caregiver will demonstrate knowledge of HLD medications, dietary recommendations, lab values with educational materials and handouts provided by staff with min assist at discharge. Outcome: Progressing

## 2022-04-21 LAB — BASIC METABOLIC PANEL
Anion gap: 9 (ref 5–15)
BUN: 23 mg/dL (ref 8–23)
CO2: 24 mmol/L (ref 22–32)
Calcium: 9.2 mg/dL (ref 8.9–10.3)
Chloride: 105 mmol/L (ref 98–111)
Creatinine, Ser: 1.44 mg/dL — ABNORMAL HIGH (ref 0.44–1.00)
GFR, Estimated: 33 mL/min — ABNORMAL LOW (ref >60)
Glucose, Bld: 128 mg/dL — ABNORMAL HIGH (ref 70–99)
Potassium: 4.5 mmol/L (ref 3.5–5.1)
Sodium: 138 mmol/L (ref 135–145)

## 2022-04-21 LAB — URINE CULTURE: Culture: >=100000 — AB

## 2022-04-21 MED ORDER — FOSFOMYCIN TROMETHAMINE 3 G PO PACK
3.0000 g | PACK | Freq: Once | ORAL | Status: AC
Start: 2022-04-21 — End: 2022-04-21
  Administered 2022-04-21: 3 g via ORAL
  Filled 2022-04-21: qty 3

## 2022-04-21 MED ORDER — LEVOFLOXACIN 250 MG PO TABS: 250.0000 mg | ORAL_TABLET | Freq: Every day | ORAL | Status: DC

## 2022-04-21 NOTE — Plan of Care (Signed)
  Problem: Consults Goal: RH STROKE PATIENT EDUCATION Description: See Patient Education module for education specifics  Outcome: Progressing   Problem: RH BOWEL ELIMINATION Goal: RH STG MANAGE BOWEL WITH ASSISTANCE Description: STG Manage Bowel with [place]. Outcome: Progressing Goal: RH STG MANAGE BOWEL W/MEDICATION W/ASSISTANCE Description: STG Manage Bowel with Medication with [place]. Outcome: Progressing   Problem: RH BLADDER ELIMINATION Goal: RH STG MANAGE BLADDER WITH ASSISTANCE Description: STG Manage Bladder With Min Assistance Outcome: Progressing Goal: RH STG MANAGE BLADDER WITH MEDICATION WITH ASSISTANCE Description: STG Manage Bladder With Medication With [place]. Outcome: Progressing   Problem: RH SKIN INTEGRITY Goal: RH STG MAINTAIN SKIN INTEGRITY WITH ASSISTANCE Description: STG Maintain Skin Integrity With [place]. Outcome: Progressing Goal: RH STG ABLE TO PERFORM INCISION/WOUND CARE W/ASSISTANCE Description: STG Able To Perform Incision/Wound Care With [employer]. Outcome: Progressing   Problem: RH SAFETY Goal: RH STG ADHERE TO SAFETY PRECAUTIONS W/ASSISTANCE/DEVICE Description: STG Adhere to Safety Precautions With Cues and Reminders. Outcome: Progressing Goal: RH STG DECREASED RISK OF FALL WITH ASSISTANCE Description: STG Decreased Risk of Fall With [employer]. Outcome: Progressing   Problem: RH COGNITION-NURSING Goal: RH STG USES MEMORY AIDS/STRATEGIES W/ASSIST TO PROBLEM SOLVE Description: STG Uses Memory Aids/Strategies With Min Assistance to Problem Solve. Outcome: Progressing Goal: RH STG ANTICIPATES NEEDS/CALLS FOR ASSIST W/ASSIST/CUES Description: STG Anticipates Needs/Calls for Assist With Cues and Reminders. Outcome: Progressing   Problem: RH KNOWLEDGE DEFICIT Goal: RH STG INCREASE KNOWLEDGE OF HYPERTENSION Description: Patient and caregivers to demonstrate knowledge of HTN medication management, dietary  recommendations, BP parameters with educational materials and handouts provided by staff with min assist at discharge. Outcome: Progressing Goal: RH STG INCREASE KNOWLEDGE OF DYSPHAGIA/FLUID INTAKE Description: Patient and caregivers to demonstrate knowledge of dysphagia diets, safe swallowing techniques with educational materials and handouts provided by staff with min assist at discharge. Outcome: Progressing Goal: RH STG INCREASE KNOWLEGDE OF HYPERLIPIDEMIA Description: Patient and caregiver will demonstrate knowledge of HLD medications, dietary recommendations, lab values with educational materials and handouts provided by staff with min assist at discharge. Outcome: Progressing

## 2022-04-21 NOTE — Progress Notes (Addendum)
Speech Language Pathology Daily Session Note  Patient Details  Name: DEAMBER BUCKHALTER MRN: [id number] Date of Birth: 02/21/1923  Today's Date: 04/21/2022 SLP Individual Time: 1100-1200 SLP Individual Time Calculation (min): 60 min  Short Term Goals: Week 3: SLP Short Term Goal 1 (Week 3): Pt will demonstrate 90% speech intelligibility sentence level with Mod I for over articulation and rate. SLP Short Term Goal 2 (Week 3): Pt will demonstrate word finding skills in higher level task (ex: divergent/convergent naming and abstract topics) with Mod I. SLP Short Term Goal 3 (Week 3): Pt will demonstrate mildly complex problem solving skills with supervision A verbal cues. SLP Short Term Goal 4 (Week 3): Pt will demonstrate recall of novel, daily information with Supevision A verbal cues for use of external aids.  Skilled Therapeutic Interventions:  Pt was seen for skilled ST targeting goals for communication.  Pt was seated upright in recliner, awake, alert, and agreeable to participating in treatment.  During functional conversations with SLP, pt was fully intelligible at the conversational level with mod I use of speech intelligibility strategies.  Pt was also noted to utilize higher level word finding strategies in moments of word finding difficulty with mod I.  Pt reports increased effort needed to maintain fluent and intelligible speech since stroke and that she is not back to baseline yet for functional communication.  Pt requested to use the bathroom and was continent of bowel and bladder while sitting on the bedside commode over toilet.  Pt was returned to recliner and left with call bell within reach and chair alarm set.  Continue per current plan of care.    Pain Pain Assessment Pain Scale: 0-10 Pain Score: 0-No pain  Therapy/Group: Individual Therapy  Yousuf Ager, Selinda Orion 04/21/2022, 4:36 PM

## 2022-04-21 NOTE — Progress Notes (Signed)
Occupational Therapy Session Note  Patient Details  Name: [name] MRN: [id number] Date of Birth: [birth year]  Today's Date: 04/21/2022 OT Individual Time: 8828-0034 OT Individual Time Calculation (min): 45 min    Short Term Goals: Week 3:  OT Short Term Goal 1 (Week 3): Pt will don pants with mod A OT Short Term Goal 2 (Week 3): Patient will perform toilet transfer with mod A of 1 and no Stedy OT Short Term Goal 3 (Week 3): Patient will complete 1 step of toileting task  Therapy Documentation Precautions:  Precautions Precautions: Fall Precaution Comments: R hemi Restrictions Weight Bearing Restrictions: No General:  Upon OT arrival, pt seated on commode reporting she is finished toileting. Pt reports no pain during session. Pt completes sit <>stand transfer on sara steady with CGA and stands with Min A-CGA to manage pants. Pt was transported on sara steady to sink and pt stood to perform hand hygiene with Min A requiring verbal cues to locate items. Pt requesting to wash up at sink and performed UB bathing, UB dressing and donn deodorant at the levels below. Pt limited by visual perception, ataxia, and decreased sensation requiring increased time and effort to complete.  Pt making slower progress towards stated OT goals but continues to benefit from OT services to achieve highest level of function. Pt was transported to recliner and TED hose were donned with Max A and legs elevated for edema management. Pt was left in recliner with all safety measures in place.     ADL: ADL Eating: Supervision/safety Where Assessed-Eating: Bed level Grooming: Minimal assistance, Minimal cueing Where Assessed-Grooming: Standing at sink, Sitting at sink Upper Body Bathing: Supervision/safety Where Assessed-Upper Body Bathing: Sitting at sink Lower Body Bathing: Moderate assistance Where Assessed-Lower Body Bathing: Standing at sink, Sitting at sink Upper Body Dressing: Minimal  assistance Where Assessed-Upper Body Dressing: Sitting at sink Lower Body Dressing: Maximal assistance (ted hose) Where Assessed-Lower Body Dressing: Standing at sink, Sitting at sink Toileting: Minimal assistance (pull up pants) Where Assessed-Toileting: Glass blower/designer: Dependent (sara steady) Toilet Transfer Method: Other (comment) (sara steady) Tub/Shower Transfer: Unable to assess   Therapy/Group: Individual Therapy  Marvetta Gibbons 04/21/2022, 12:18 PM

## 2022-04-21 NOTE — Progress Notes (Addendum)
PROGRESS NOTE   Subjective/Complaints: She had a good night. No nightmares last night. Pain seemed better in legs although she hasn't gotten kpad yet.   ROS: Patient denies fever, rash, sore throat, blurred vision, dizziness, nausea, vomiting, diarrhea, cough, shortness of breath or chest pain,  back/neck pain, headache, or mood change.    Objective:   No results found.  No results for input(s): "WBC", "HGB", "HCT", "PLT" in the last 72 hours.    No results for input(s): "NA", "K", "CL", "CO2", "GLUCOSE", "BUN", "CREATININE", "CALCIUM" in the last 72 hours.     Intake/Output Summary (Last 24 hours) at 04/21/2022 0820 Last data filed at 04/20/2022 1811 Gross per 24 hour  Intake 476 ml  Output --  Net 476 ml        Physical Exam: Vital Signs Blood pressure (!) 114/56, pulse (!) 58, temperature 97.8 F (36.6 C), temperature source Oral, resp. rate 17, height '5\' 6"'$  (8.850 m), weight 70.3 kg, SpO2 95 %.  Constitutional: No distress . Vital signs reviewed. HEENT: NCAT, EOMI, oral membranes moist Neck: supple Cardiovascular: RRR without murmur. No JVD    Respiratory/Chest: CTA Bilaterally without wheezes or rales. Normal effort    GI/Abdomen: BS +, non-tender, non-distended Ext: no clubbing, cyanosis, or edema Psych: pleasant and cooperative, relaxed  Skin: Clean and intact without signs of breakdown Neuro:  alert oriented to person,hospital, reason. Much more focused and appropriate.  Follows commands. Language improving.. Minimal right central 7.  RUE 3-4/5 at least. RLE 3 to 3+/5. Sensation intact for pain and LT Musculoskeletal: left shoulder less tender. No leg pain today    Assessment/Plan: 1. Functional deficits which require 3+ hours per day of interdisciplinary therapy in a comprehensive inpatient rehab setting. Physiatrist is providing close team supervision and 24 hour management of active medical problems  listed below. Physiatrist and rehab team continue to assess barriers to discharge/monitor patient progress toward functional and medical goals  Care Tool:  Bathing    Body parts bathed by patient: Front perineal area, Abdomen, Chest, Left arm, Right arm, Right upper leg, Left upper leg, Right lower leg, Left lower leg, Face   Body parts bathed by helper: Buttocks     Bathing assist Assist Level: Moderate Assistance - Patient 50 - 74%     Upper Body Dressing/Undressing Upper body dressing   What is the patient wearing?: Pull over shirt, Bra    Upper body assist Assist Level: Minimal Assistance - Patient > 75%    Lower Body Dressing/Undressing Lower body dressing      What is the patient wearing?: Pants, Incontinence brief     Lower body assist Assist for lower body dressing: Moderate Assistance - Patient 50 - 74%     Toileting Toileting    Toileting assist Assist for toileting: Total Assistance - Patient < 25%     Transfers Chair/bed transfer  Transfers assist     Chair/bed transfer assist level: Maximal Assistance - Patient 25 - 49%     Locomotion Ambulation   Ambulation assist   Ambulation activity did not occur: Safety/medical concerns  Assist level: Moderate Assistance - Patient 50 - 74% Assistive device: Other (comment) (  weighted grocery cart) Max distance: 30   Walk 10 feet activity   Assist  Walk 10 feet activity did not occur: Safety/medical concerns  Assist level: Moderate Assistance - Patient - 50 - 74% Assistive device: Other (comment)   Walk 50 feet activity   Assist Walk 50 feet with 2 turns activity did not occur: Safety/medical concerns         Walk 150 feet activity   Assist Walk 150 feet activity did not occur: Safety/medical concerns         Walk 10 feet on uneven surface  activity   Assist Walk 10 feet on uneven surfaces activity did not occur: Safety/medical concerns         Wheelchair     Assist Is  the patient using a wheelchair?: Yes Type of Wheelchair: Manual    Wheelchair assist level: Minimal Assistance - Patient > 75% Max wheelchair distance: 25    Wheelchair 50 feet with 2 turns activity    Assist        Assist Level: Maximal Assistance - Patient 25 - 49% (Per PT documentation of 20 feet)   Wheelchair 150 feet activity     Assist      Assist Level: Total Assistance - Patient < 25% (Per PT documentation of 20 feet)   Blood pressure (!) 114/56, pulse (!) 58, temperature 97.8 F (36.6 C), temperature source Oral, resp. rate 17, height '5\' 6"'$  (1.676 m), weight 70.3 kg, SpO2 95 %.  Medical Problem List and Plan: 1. Functional deficits secondary to left thalamic intracranial hemorrhage             -patient may shower             -ELOS/Goals: 14-16 days, supervision to min assist  -Continue CIR therapies including PT, OT, and SLP  -Have reduced intensity to 15/7  - mental status improving   -minimizing neurosedating meds, maximize sleep    2.  Antithrombotics: -DVT/anticoagulation:  Pharmaceutical: Heparin             -antiplatelet therapy: none 3. Pain: continue Tylenol as needed.    -xray of left shoulder demonstrates significant DJD, old humeral neck fx  -added voltaren gel to left shoulder with benefit  -rom to tolerance, discussed with patient the importance maintaining rom  -6/11 reduced gabapentin back to '200mg'$  last night, and she seems to better cognitively with that dose despite having been on '300mg'$  for awhile at home.  -await arrival of kpad 4. Mood/sleep: team providing ego support             -antipsychotic agents: n/a  -family is providing  emotional support as well  -melatonin dc'ed d/t questionable side effects 5. Neuropsych: This patient is not capable of making decisions on her own behalf. 6. Skin/Wound Care: Routine skin care checks 7. Fluids/Electrolytes/Nutrition:               --encouraging PO  - intake reasonable  -  Boost Breeze  to improve protein 8. Hypertension: continue diltiazem, hydralazine, nadolol. Home losartan held  -6/11 bp controlled. Holding nadolol prn for hypotension   -have decreased hydralazine to '25mg'$   9: Hyperlipidemia: continue atorvastatin 10: GI prophylaxis: continue pantoprazole 11: pAF: continue diltiazem. No AC until resolution of ICH, outpt follow-up 12: Hypothyroidism: continue Synthroid 13: Urinary retention: continue Flomax             - now voiding   -continue urecholine '10mg'$  tid  14: UTI:  Keflex completed  -  repeat UA neg but urine still cloudy  -6/11 100k citrobacter---has keflex allergy--begin levaquin 15: AKI: BUN and Cr  have improved             -encouraging fluids.  16: Leukocytosis: wbc's normal.   17: Anemia, chronic: no evidence of acute blood loss -hgb stable at 8.8 6/5 18: Polymyalgia rheumatica: prednisone 2 mg with lunch 19. Dysphagia:               -advanced to reg/thins per SLP 20. macular degeneration of the right eye, uses preservision at home. - improved  Continue use of artifical tears and Prosight.    LOS: 19 days A FACE TO [facility] 04/21/2022, 8:20 AM

## 2022-04-21 NOTE — Progress Notes (Signed)
Physical Therapy Session Note  Patient Details  Name: [name] MRN: [id number] Date of Birth: 1923-11-05  Today's Date: 04/21/2022 PT Individual Time: 1525-1620 PT Individual Time Calculation (min): 55 min   Short Term Goals: Week 1:  PT Short Term Goal 1 (Week 1): pt will roll side to side w/ CGA. PT Short Term Goal 1 - Progress (Week 1): Met PT Short Term Goal 2 (Week 1): Pt will transfer sup/sidelying to sit w/ min A PT Short Term Goal 2 - Progress (Week 1): Met PT Short Term Goal 3 (Week 1): Pt will transfer sit to stand w/ mod A PT Short Term Goal 3 - Progress (Week 1): Met PT Short Term Goal 4 (Week 1): Pt will perform SPT w/ mod A PT Short Term Goal 4 - Progress (Week 1): Met Week 2:  PT Short Term Goal 1 (Week 2): Pt will complete bed mobility with minA. PT Short Term Goal 2 (Week 2): Pt will complete bed to chair transfer with minA consistently. PT Short Term Goal 3 (Week 2): Pt will perform sit to stand with minA consistently. PT Short Term Goal 4 (Week 2): Pt will ambulate x30' with modA +1 and LRAD.     Skilled Therapeutic Interventions/Progress Updates:  Pt seated in wc.  She pain.  Son present.  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 She reported vision in L eye is blurry at times, since CVA. Pt's inattention to R hemibody may be partly due to R eye low vision.  When cued to turn her head to the R, she did so briefly.    Pt has new lace- up supportive shoes.  She donned shoes with mod assist after laces loosened.  TEDS already donned.   Transfer training for elevating hips from wc with hands on chair seat in front of her, x 5 with mod/max assist.  Education for sequencing  sit>< stand: 1. scooting forward in chair, 2. placing feet apart and under her, 3. pushing up.  Pt tends to overpower with LUE as she pushes up.  Wc> recliner to L at end of session, squat pivot with pt pulling on far armrest of recliner, mod  assist, and safer.  neuromuscular re-education via multimodal cues and visual feedback: in recliner with bil LEs elevated: bil ankle pumps, bil ankle circles, bil ankle eversion; seated in wc: heel raises, toe raises.   Sit> stand with mod assist to EVA walker.  Gait training on level tile, 1 R turn, x 45' with mod assist.  Distance limited by SOB, wheezing.  PT discussed with Beau Fanny, RN.    At end of session, pt in recliner with seat belt alarm set and needs at hand.  Son present.       Therapy Documentation Precautions:  Precautions Precautions: Fall Precaution Comments: R hemi Restrictions Weight Bearing Restrictions: No       Therapy/Group: Individual Therapy  Jagar Lua 04/21/2022, 4:28 PM

## 2022-04-22 LAB — BASIC METABOLIC PANEL
Anion gap: 7 (ref 5–15)
BUN: 22 mg/dL (ref 8–23)
CO2: 25 mmol/L (ref 22–32)
Calcium: 9.1 mg/dL (ref 8.9–10.3)
Chloride: 109 mmol/L (ref 98–111)
Creatinine, Ser: 1.42 mg/dL — ABNORMAL HIGH (ref 0.44–1.00)
GFR, Estimated: 34 mL/min — ABNORMAL LOW (ref >60)
Glucose, Bld: 103 mg/dL — ABNORMAL HIGH (ref 70–99)
Potassium: 4.4 mmol/L (ref 3.5–5.1)
Sodium: 141 mmol/L (ref 135–145)

## 2022-04-22 LAB — CBC
HCT: 27.6 % — ABNORMAL LOW (ref 36.0–46.0)
Hemoglobin: 8.6 g/dL — ABNORMAL LOW (ref 12.0–15.0)
MCH: 29.2 pg (ref 26.0–34.0)
MCHC: 31.2 g/dL (ref 30.0–36.0)
MCV: 93.6 fL (ref 80.0–100.0)
Platelets: 242 10*3/uL (ref 150–400)
RBC: 2.95 MIL/uL — ABNORMAL LOW (ref 3.87–5.11)
RDW: 15.6 % — ABNORMAL HIGH (ref 11.5–15.5)
WBC: 6.2 10*3/uL (ref 4.0–10.5)
nRBC: 0 % (ref 0.0–0.2)

## 2022-04-22 NOTE — Progress Notes (Signed)
Speech Language Pathology Weekly Progress and Session Note  Patient Details  Name: PETRONA WYETH MRN: [id number] Date of Birth: 1923/03/10  Beginning of progress report period: April 15, 2022 End of progress report period: April 22, 2022  Today's Date: 04/22/2022 SLP Individual Time: 1350-1430 SLP Individual Time Calculation (min): 40 min  Short Term Goals: Week 3: SLP Short Term Goal 1 (Week 3): Pt will demonstrate 90% speech intelligibility sentence level with Mod I for over articulation and rate. SLP Short Term Goal 1 - Progress (Week 3): Met SLP Short Term Goal 2 (Week 3): Pt will demonstrate word finding skills in higher level task (ex: divergent/convergent naming and abstract topics) with Mod I. SLP Short Term Goal 2 - Progress (Week 3): Met SLP Short Term Goal 3 (Week 3): Pt will demonstrate mildly complex problem solving skills with supervision A verbal cues. SLP Short Term Goal 3 - Progress (Week 3): Not met SLP Short Term Goal 4 (Week 3): Pt will demonstrate recall of novel, daily information with Supevision A verbal cues for use of external aids. SLP Short Term Goal 4 - Progress (Week 3): Met    New Short Term Goals: Week 4: SLP Short Term Goal 1 (Week 4): STGs=LTGs due to ELOS  Weekly Progress Updates: Patient has made excellent gains and has met 3 of 4 STGs this reporting period. Currently, patient is overall Mod I for use of word-finding and speech intelligibility strategies at the sentence level resulting in 100% intelligibility. Patient also demonstrates improved cognitive functioning and requires supervision level verbal cues for recall of functional information. Supervision-min verbal cues are needed for complex problem solving with functional tasks. Patient and family education ongoing. Patient would benefit from continued skilled SLP intervention to maximize her cognitive-linguistic functioning prior to discharge.      Intensity: Minumum of 1-2 x/day, 30 to 90  minutes Frequency: 3 to 5 out of 7 days Duration/Length of Stay: 04/30/22 Treatment/Interventions: Cognitive remediation/compensation;Cueing hierarchy;Functional tasks;Internal/external aids;Speech/Language facilitation;Patient/family education;Dysphagia/aspiration precaution training;Environmental controls;Therapeutic Activities   Daily Session  Skilled Therapeutic Interventions:  Skilled treatment session focused on cognitive-linguistic goals. Upon arrival, patient was upright in the recliner. SLP facilitated session by providing supervision level verbal cues for recall of events from previous therapy sessions. Throughout discussion, patient with obvious difficulty with multi-syllabic words and demonstrated decreased breath support. SLP facilitated session by providing education regarding compensatory strategies and diaphragmatic breathing. Patient utilized strategies at the word level with supervision level verbal cues. Patient left upright in recliner with alarm on and all needs within reach. Continue with current plan of care.     Pain No/Denies Pain   Therapy/Group: Individual Therapy  Aliena Ghrist 04/22/2022, 6:32 AM

## 2022-04-22 NOTE — Progress Notes (Signed)
Occupational Therapy Session Note  Patient Details  Name: [name] MRN: [id number] Date of Birth: [birth year]  Today's Date: 04/22/2022 OT Individual Time: 3235-5732 OT Individual Time Calculation (min): 42 min   Short Term Goals: Week 3:  OT Short Term Goal 1 (Week 3): Pt will don pants with mod A OT Short Term Goal 2 (Week 3): Patient will perform toilet transfer with mod A of 1 and no Stedy OT Short Term Goal 3 (Week 3): Patient will complete 1 step of toileting task  Skilled Therapeutic Interventions/Progress Updates:    Pt greeted semi-reclined in bed and stated she had a good weekend. Pt completed bed mobility w/ HOB elevated and min A. Mod A squat-pivot to wc on L side using bobath method to maintain anterior weight shift. Pt brought to the sink for bathing/dressing tasks. Focus on functional use of R UE with improved accuracy when reaching for items and grasping wash cloth. Pt reported need to urinate. Stedy used for transfer into bathroom w/ Min A sit<>stands. Pt with continent void of bladder. Pt able to cleanse peri-area standing in [place] with min a.  Pt with mild edema in B LE's so OT donned knee high TEDs with education for use of friction reducing bag. Pt left seated in wc at end of session with alarm belt on, call bell in reach, and needs met.   Therapy Documentation Precautions:  Precautions Precautions: Fall Precaution Comments: R hemi Restrictions Weight Bearing Restrictions: No Pain:  Denies pain   Therapy/Group: Individual Therapy  Valma Cava 04/22/2022, 9:15 AM

## 2022-04-22 NOTE — Progress Notes (Signed)
PROGRESS NOTE   Subjective/Complaints:  Pt reports doing well- slept fairly.  Legs still hurt esp when doing therapy.     ROS:  Pt denies SOB, abd pain, CP, N/V/C/D, and vision changes   Objective:   No results found.  Recent Labs    04/22/22 0513  WBC 6.2  HGB 8.6*  HCT 27.6*  PLT 242      Recent Labs    04/21/22 0900 04/22/22 0513  NA 138 141  K 4.5 4.4  CL 105 109  CO2 24 25  GLUCOSE 128* 103*  BUN 23 22  CREATININE 1.44* 1.42*  CALCIUM 9.2 9.1       Intake/Output Summary (Last 24 hours) at 04/22/2022 1247 Last data filed at 04/22/2022 0824 Gross per 24 hour  Intake 716 ml  Output --  Net 716 ml        Physical Exam: Vital Signs Blood pressure (!) 120/56, pulse 70, temperature 98.3 F (36.8 C), temperature source Oral, resp. rate 19, height '5\' 6"'$  (1.676 m), weight 70.3 kg, SpO2 98 %.    General: awake, alert, appropriate, sitting up in bed; watching TV; NAD HENT: conjugate gaze; oropharynx moist CV: regular rate; no JVD Pulmonary: CTA B/L; no W/R/R- good air movement GI: soft, NT, ND, (+)BS Psychiatric: appropriate Neurological: alert Ext: no clubbing, cyanosis, or edema Psych: pleasant and cooperative, relaxed  Skin: Clean and intact without signs of breakdown Neuro:  alert oriented to person,hospital, reason. Much more focused and appropriate.  Follows commands. Language improving.. Minimal right central 7.  RUE 3-4/5 at least. RLE 3 to 3+/5. Sensation intact for pain and LT Musculoskeletal: left shoulder less tender. No leg pain today    Assessment/Plan: 1. Functional deficits which require 3+ hours per day of interdisciplinary therapy in a comprehensive inpatient rehab setting. Physiatrist is providing close team supervision and 24 hour management of active medical problems listed below. Physiatrist and rehab team continue to assess barriers to discharge/monitor patient  progress toward functional and medical goals  Care Tool:  Bathing    Body parts bathed by patient: Right arm, Left arm, Chest, Abdomen, Face   Body parts bathed by helper: Buttocks     Bathing assist Assist Level: Minimal Assistance - Patient > 75%     Upper Body Dressing/Undressing Upper body dressing   What is the patient wearing?: Pull over shirt    Upper body assist Assist Level: Minimal Assistance - Patient > 75%    Lower Body Dressing/Undressing Lower body dressing      What is the patient wearing?: Pants, Incontinence brief     Lower body assist Assist for lower body dressing: Moderate Assistance - Patient 50 - 74%     Toileting Toileting    Toileting assist Assist for toileting: Minimal Assistance - Patient > 75%     Transfers Chair/bed transfer  Transfers assist     Chair/bed transfer assist level: Moderate Assistance - Patient 50 - 74%     Locomotion Ambulation   Ambulation assist   Ambulation activity did not occur: Safety/medical concerns  Assist level: Moderate Assistance - Patient 50 - 74% Assistive device: Ethelene Hal Max distance: 45  Walk 10 feet activity   Assist  Walk 10 feet activity did not occur: Safety/medical concerns  Assist level: Moderate Assistance - Patient - 50 - 74% Assistive device: Walker-Eva   Walk 50 feet activity   Assist Walk 50 feet with 2 turns activity did not occur: Safety/medical concerns         Walk 150 feet activity   Assist Walk 150 feet activity did not occur: Safety/medical concerns         Walk 10 feet on uneven surface  activity   Assist Walk 10 feet on uneven surfaces activity did not occur: Safety/medical concerns         Wheelchair     Assist Is the patient using a wheelchair?: Yes Type of Wheelchair: Manual    Wheelchair assist level: Minimal Assistance - Patient > 75% Max wheelchair distance: 25    Wheelchair 50 feet with 2 turns activity    Assist         Assist Level: Maximal Assistance - Patient 25 - 49% (Per PT documentation of 20 feet)   Wheelchair 150 feet activity     Assist      Assist Level: Total Assistance - Patient < 25% (Per PT documentation of 20 feet)   Blood pressure (!) 120/56, pulse 70, temperature 98.3 F (36.8 C), temperature source Oral, resp. rate 19, height '5\' 6"'$  (1.676 m), weight 70.3 kg, SpO2 98 %.  Medical Problem List and Plan: 1. Functional deficits secondary to left thalamic intracranial hemorrhage             -patient may shower             -ELOS/Goals: 14-16 days, supervision to min assist -Have reduced intensity to 15/7  - mental status improving   -minimizing neurosedating meds, maximize sleep  Con't CIR- PT, OT and SLP 2.  Antithrombotics: -DVT/anticoagulation:  Pharmaceutical: Heparin             -antiplatelet therapy: none 3. Pain: continue Tylenol as needed.    -xray of left shoulder demonstrates significant DJD, old humeral neck fx  -added voltaren gel to left shoulder with benefit  -rom to tolerance, discussed with patient the importance maintaining rom  -6/11 reduced gabapentin back to '200mg'$  last night, and she seems to better cognitively with that dose despite having been on '300mg'$  for awhile at home.  -await arrival of kpad  6/12- legs still hurting- but will take meds before therapy. 4. Mood/sleep: team providing ego support             -antipsychotic agents: n/a  -family is providing  emotional support as well  -melatonin dc'ed d/t questionable side effects 5. Neuropsych: This patient is not capable of making decisions on her own behalf. 6. Skin/Wound Care: Routine skin care checks 7. Fluids/Electrolytes/Nutrition:               --encouraging PO  - intake reasonable  -  Boost Breeze to improve protein 8. Hypertension: continue diltiazem, hydralazine, nadolol. Home losartan held  -6/11 bp controlled. Holding nadolol prn for hypotension   -have decreased hydralazine to '25mg'$     6/12- BP cotnrolled- con't regimen 9: Hyperlipidemia: continue atorvastatin 10: GI prophylaxis: continue pantoprazole 11: pAF: continue diltiazem. No AC until resolution of ICH, outpt follow-up 12: Hypothyroidism: continue Synthroid 13: Urinary retention: continue Flomax             - now voiding   -continue urecholine '10mg'$  tid  14: UTI:  Keflex  completed  -  repeat UA neg but urine still cloudy  -6/11 100k citrobacter---has keflex allergy--begin levaquin  6/12- Citrobacter >100k- pan sensitive- con't Levaquin 15: AKI: BUN and Cr  have improved             -encouraging fluids.  16: Leukocytosis: wbc's normal.   17: Anemia, chronic: no evidence of acute blood loss -hgb stable at 8.8 6/5 18: Polymyalgia rheumatica: prednisone 2 mg with lunch 19. Dysphagia:               -advanced to reg/thins per SLP 20. macular degeneration of the right eye, uses preservision at home. - improved  Continue use of artifical tears and Prosight.    LOS: 20 days A FACE TO FACE EVALUATION WAS PERFORMED  [name] 04/22/2022, 12:47 PM

## 2022-04-22 NOTE — Progress Notes (Signed)
Physical Therapy Weekly Progress Note  Patient Details  Name: [name] MRN: [id number] Date of Birth: 1923/02/25  Beginning of progress report period: April 15, 2022 End of progress report period: April 22, 2022  Today's Date: 04/22/2022 PT Individual Time: 6063-0160 PT Individual Time Calculation (min): 42 min   Patient has met 2 of 4 short term goals. Pt is progressing well toward mobility goals, improving independence with bed mobility, balance, transfers, and ambulation. Pt performs bed mobility consistently at CGA to minA level. Transfers requires minA to modA, and ambulation typically requires modA overall. Pt will benefit from hands-on, family education prior to discharge.  Patient continues to demonstrate the following deficits muscle weakness, decreased cardiorespiratoy endurance, decreased coordination, decreased attention to right, and decreased sitting balance, decreased standing balance, decreased postural control, hemiplegia, and decreased balance strategies and therefore will continue to benefit from skilled PT intervention to increase functional independence with mobility.  Patient progressing toward long term goals..  Continue plan of care.  PT Short Term Goals Week 2:  PT Short Term Goal 1 (Week 2): Pt will complete bed mobility with minA. PT Short Term Goal 1 - Progress (Week 2): Met PT Short Term Goal 2 (Week 2): Pt will complete bed to chair transfer with minA consistently. PT Short Term Goal 2 - Progress (Week 2): Progressing toward goal PT Short Term Goal 3 (Week 2): Pt will perform sit to stand with minA consistently. PT Short Term Goal 3 - Progress (Week 2): Progressing toward goal PT Short Term Goal 4 (Week 2): Pt will ambulate x30' with modA +1 and LRAD. PT Short Term Goal 4 - Progress (Week 2): Met Week 3:  PT Short Term Goal 1 (Week 3): STGs = LTGs  Skilled Therapeutic Interventions/Progress Updates:    Pt received seated in WC and agrees to therapy.  During session pt mentions pain in R knee. Acute on chronic. Number not provided. PT provides rest breaks as needed to manage pain. Pt performs stand step transfer to Nustep with modA and cues for body mechanics, sequencing and positioning. Pt completes Nustep for strength and endurance training, as well as reciprocal coordination. Pt completes x8:00 at workload of 3 with average steps per minute ~50. PT provides cues for full ROM and placement of R hand, as well as utilizing vision for visual feedback and improved coordination of R upper extremity.   Pt ambulates x40' with RW and modA/maxA, with cues for placement of R lower extremity to prevent adduction and R lateral lean. Pt has most difficulty with turns and approach to WC. Following seated rest break, pt ambulates additional x45' with RW and modA and +2 for WC follow. Pt has improved  gait mechanics and safety when not turning with RW.   WC transport back to room. Stand pivot back to recliner with minA/modA. Left seated with alarm intact and all needs within reach.   Therapy Documentation Precautions:  Precautions Precautions: Fall Precaution Comments: R hemi Restrictions Weight Bearing Restrictions: No   Therapy/Group: Individual Therapy  Breck Coons, PT, DPT 04/22/2022, 6:18 PM

## 2022-04-22 NOTE — Plan of Care (Signed)
  Problem: Consults Goal: RH STROKE PATIENT EDUCATION Description: See Patient Education module for education specifics  Outcome: Progressing   Problem: RH BOWEL ELIMINATION Goal: RH STG MANAGE BOWEL WITH ASSISTANCE Description: STG Manage Bowel with [place]. Outcome: Progressing Goal: RH STG MANAGE BOWEL W/MEDICATION W/ASSISTANCE Description: STG Manage Bowel with Medication with [place]. Outcome: Progressing   Problem: RH BLADDER ELIMINATION Goal: RH STG MANAGE BLADDER WITH ASSISTANCE Description: STG Manage Bladder With Min Assistance Outcome: Progressing Goal: RH STG MANAGE BLADDER WITH MEDICATION WITH ASSISTANCE Description: STG Manage Bladder With Medication With [place]. Outcome: Progressing   Problem: RH SKIN INTEGRITY Goal: RH STG MAINTAIN SKIN INTEGRITY WITH ASSISTANCE Description: STG Maintain Skin Integrity With [place]. Outcome: Progressing Goal: RH STG ABLE TO PERFORM INCISION/WOUND CARE W/ASSISTANCE Description: STG Able To Perform Incision/Wound Care With [employer]. Outcome: Progressing   Problem: RH SAFETY Goal: RH STG ADHERE TO SAFETY PRECAUTIONS W/ASSISTANCE/DEVICE Description: STG Adhere to Safety Precautions With Cues and Reminders. Outcome: Progressing Goal: RH STG DECREASED RISK OF FALL WITH ASSISTANCE Description: STG Decreased Risk of Fall With [employer]. Outcome: Progressing   Problem: RH COGNITION-NURSING Goal: RH STG USES MEMORY AIDS/STRATEGIES W/ASSIST TO PROBLEM SOLVE Description: STG Uses Memory Aids/Strategies With Min Assistance to Problem Solve. Outcome: Progressing Goal: RH STG ANTICIPATES NEEDS/CALLS FOR ASSIST W/ASSIST/CUES Description: STG Anticipates Needs/Calls for Assist With Cues and Reminders. Outcome: Progressing   Problem: RH KNOWLEDGE DEFICIT Goal: RH STG INCREASE KNOWLEDGE OF HYPERTENSION Description: Patient and caregivers to demonstrate knowledge of HTN medication management, dietary  recommendations, BP parameters with educational materials and handouts provided by staff with min assist at discharge. Outcome: Progressing Goal: RH STG INCREASE KNOWLEDGE OF DYSPHAGIA/FLUID INTAKE Description: Patient and caregivers to demonstrate knowledge of dysphagia diets, safe swallowing techniques with educational materials and handouts provided by staff with min assist at discharge. Outcome: Progressing Goal: RH STG INCREASE KNOWLEGDE OF HYPERLIPIDEMIA Description: Patient and caregiver will demonstrate knowledge of HLD medications, dietary recommendations, lab values with educational materials and handouts provided by staff with min assist at discharge. Outcome: Progressing

## 2022-04-23 MED ORDER — HYDRALAZINE HCL 10 MG PO TABS
10.0000 mg | ORAL_TABLET | Freq: Three times a day (TID) | ORAL | Status: DC
  Administered 2022-04-23 – 2022-04-26 (×9): 10 mg via ORAL
  Filled 2022-04-23 (×9): qty 1

## 2022-04-23 NOTE — Progress Notes (Signed)
Occupational Therapy Weekly Progress Note  Patient Details  Name: [name] MRN: [id number] Date of Birth: 08/12/1923  Beginning of progress report period: Apr 02, 2022 End of progress report period: April 23, 2022   Patient has met 3 of 3 short term goals.  Patient has made steady progress towards OT goals this week. Pt still requires mostly mod A with her transfers if her body is positioned correctly prior. Pt still has R side awareness and proprioceptive deficits, making transfers difficult if she leaves her foot behind or doesn't have R LE undernseath her. R UE function continues to improve with much more smoothness and accuracy.Continue current POC.   Patient continues to demonstrate the following deficits: muscle weakness, decreased cardiorespiratoy endurance, unbalanced muscle activation, motor apraxia, decreased coordination, and decreased motor planning, decreased attention to right and decreased motor planning, decreased initiation, decreased attention, decreased awareness, decreased problem solving, decreased safety awareness, and decreased memory, and decreased sitting balance, decreased standing balance, decreased postural control, hemiplegia, and decreased balance strategies and therefore will continue to benefit from skilled OT intervention to enhance overall performance with BADL and Reduce care partner burden.  Patient progressing toward long term goals..  Continue plan of care.  OT Short Term Goals Week 3:  OT Short Term Goal 1 (Week 3): Pt will don pants with mod A OT Short Term Goal 1 - Progress (Week 3): Met OT Short Term Goal 2 (Week 3): Patient will perform toilet transfer with mod A of 1 and no Stedy OT Short Term Goal 2 - Progress (Week 3): Met OT Short Term Goal 3 (Week 3): Patient will complete 1 step of toileting task OT Short Term Goal 3 - Progress (Week 3): Met Week 4:  OT Short Term Goal 1 (Week 4): LTG=STG 2/2 ELOS  Therapy Documentation Precautions:   Precautions Precautions: Fall Precaution Comments: R hemi Restrictions Weight Bearing Restrictions: No Pain:  Denies pain  Therapy/Group: Individual Therapy   S  04/23/2022, 9:03 AM  

## 2022-04-23 NOTE — Progress Notes (Signed)
Occupational Therapy Session Note  Patient Details  Name: [name] MRN: [id number] Date of Birth: 01-30-1923  Today's Date: 04/23/2022 OT Individual Time: 3748-2707 OT Individual Time Calculation (min): 43 min    Short Term Goals: Week 4:  OT Short Term Goal 1 (Week 4): LTG=STG 2/2 ELOS  Skilled Therapeutic Interventions/Progress Updates:    Pt received supine in bed and agreeable to wash up and get dressed for the day. BP assessed supine d/t self-report of low BP early in the morning. BP:108/51. Pulse: 96. BP reassessed seated EOB. BP 106/65. Pulse 55. Stand pivot transfer from EOB > w/c with Mod A. RLE demonstrating ataxic movement and slow to move with rest of body. Pt completed UB/LB bathing overall Min A. Max A for bathing bottom but pt able to dry area with (S). Verbal reminders providing for hemi technique for dressing. Required Max A to don bra and Mod A for shirt and reminders to pull beyond elbows for ease/comfort. Pt does well with additional time and cues for visual attention to task. Pt stood from w/c to sink with Min A 3x to complete LB bathing, dressing, and oral care. Therapist providing verbal cues for WB of RUE on counter for stability. Some swaying/LOB noted while standing at the sink. Able to open toothbrush container and facial lotion using BUE. Stand pivot transfer from w/c to recliner with Mod A. Pt left seated in recliner with chair alarm on, call bell in reach, and all needs met.  Therapy Documentation Precautions:  Precautions Precautions: Fall Precaution Comments: R hemi Restrictions Weight Bearing Restrictions: No   Therapy/Group: Individual Therapy  Catalina Lunger 04/23/2022, 12:04 PM

## 2022-04-23 NOTE — Plan of Care (Signed)
After speaking with care team, patient's plan of care has been adjusted back to a normal schedule of 3 hours due to improvements in arousal and endurance.

## 2022-04-23 NOTE — Progress Notes (Signed)
Physical Therapy Session Note  Patient Details  Name: [name] MRN: [id number] Date of Birth: [birth year]  Today's Date: 04/23/2022 PT Individual Time: 1103-1200 PT Individual Time Calculation (min): 57 min   Short Term Goals: Week 3:  PT Short Term Goal 1 (Week 3): STGs = LTGs  Skilled Therapeutic Interventions/Progress Updates:     Pt received seated in recliner and agrees to therapy. No complaint of pain. Pt performs stand pivot transfer to the R with modA and cues for hand placement and body mechanics, as well as initiation and sequencing. WC transport to gym for time management. Pt performs sit to stand with modA and cues for hand placement and anterior trunk lean. Pt ambulates 10' around cone and 10' back to [facility]. Pt initially requires minA/modA for first several steps but has difficulty adequately weight shifting to the L and R hemibody apraxia causes pt to reqiure maxA for majority of distance.   Pt stand with RW and performs targeted toe tapping, primarily with R lower extremity, with intent of NMR and improving lateral weight shift to the L. Activity broken down into steps with PT verbally and manually facilitating lateral weight shift prior to pt attempting to step with R leg. Pt able to clear R foot from floor each time but has notable ataxia, often putting R foot down well behind L foot. At one point pt even crosses over L foot with R in scissoring motion. Pt appears frustrated with her difficulty performing task so PT provides pt with more manageable task, asked to perform bilateral arm raise holding onto 3 lb bar with both hands to work on R upper extremity NMR, 2x10 with cues for posture and performance.  Pt ambulates x30 with RW and +2 for WC follow so that she does not have to turn. Again pt requires minA/modA for first several steps but modA/maxA as distance increases. Pt takes seated rest break and ambulates x60' with EVA walker and modA, with improved posture and less ataxia  noted in R lower extremity. WC transport back to room. MaxA for stand step transfer back to recliner with pt again crossing R foot over L. Left with alarm intact and all needs within reach.  Therapy Documentation Precautions:  Precautions Precautions: Fall Precaution Comments: R hemi Restrictions Weight Bearing Restrictions: No  Therapy/Group: Individual Therapy  Breck Coons, PT, DPT 04/23/2022, 12:46 PM

## 2022-04-23 NOTE — Progress Notes (Signed)
MD notified of patient's BP; orders noted

## 2022-04-23 NOTE — Progress Notes (Signed)
Speech Language Pathology Daily Session Note  Patient Details  Name: [name] MRN: [id number] Date of Birth: 10-Jun-1923  Today's Date: 04/23/2022 SLP Individual Time: 1007-1219 SLP Individual Time Calculation (min): 25 min  Short Term Goals: Week 4: SLP Short Term Goal 1 (Week 4): STGs=LTGs due to ELOS  Skilled Therapeutic Interventions: Skilled treatment session focused on speech goals. Patient independently recalled events from previous therapy sessions with Mod I. Patient was overall Mod I for word-finding but demonstrated decreased intelligibility due to impaired breath support. SLP facilitated session by providing Max A multimodal cues for use of diaphragmatic breathing during sustained phonation and at the word level. Patient left upright in recliner with alarm on and all needs within reach. Continue with current plan of care.    Pain No/Denies Pain  Therapy/Group: Individual Therapy  Pamela Intrieri 04/23/2022, 4:06 PM

## 2022-04-23 NOTE — Progress Notes (Signed)
Patient ID: [name], female   DOB: 12-07-1922, 88 y.o.   MRN: [id number]  SW returned phone call to pt son Gershon Mussel to inform that someone is always present when updates are provided to his mother, and if no one is present, SW follows up with their sister Jana Half. He reports his sister Jana Half will be there today.   *SW met with pt and pt dtr Jana Half in room to discuss above. Pt is willing to consider SNF, and family amenable to family edu tomorrow 9am-12pm. Dtr will confirm with her brothers family edu tomorrow.   SW later met with pt and children in room to discuss discharge plan. Pt children do not believe pt is ready to d/c to home and would like if she can have an extension here. SW continued to discuss discharge plan if an extension was granted. Reports they will continue to explore addtl supports through private aide, and would like to really take their mother home, however, would like an extension because they feel rehab is better here. SW shared will discuss with medical team if appropriate for an extension with plan to d/c to home. Family states if she does not do as well as they think, she may still go to SNF. SW discussed SNF placement process, and requiring insurance approval. SW provided SNF list vis Medicare.gov for reviews and [employer]. SW will send out SNF referral, and family edu scheduled for Thursday (6/15) 1pm-4pm. SW discussed above with medical team.   Loralee Pacas, MSW, [place] Office: [phone] Cell: [phone] Fax: [phone]

## 2022-04-23 NOTE — Progress Notes (Signed)
PROGRESS NOTE   Subjective/Complaints:  Still SOB with exertion. Bp's remain soft. Leg pain better last night. Slept well  ROS: Patient denies fever, rash, sore throat, blurred vision, dizziness, nausea, vomiting, diarrhea, cough, shortness of breath or chest pain, joint or back/neck pain, headache .    Objective:   No results found.  Recent Labs    04/22/22 0513  WBC 6.2  HGB 8.6*  HCT 27.6*  PLT 242      Recent Labs    04/21/22 0900 04/22/22 0513  NA 138 141  K 4.5 4.4  CL 105 109  CO2 24 25  GLUCOSE 128* 103*  BUN 23 22  CREATININE 1.44* 1.42*  CALCIUM 9.2 9.1       Intake/Output Summary (Last 24 hours) at 04/23/2022 0913 Last data filed at 04/23/2022 0756 Gross per 24 hour  Intake 720 ml  Output --  Net 720 ml        Physical Exam: Vital Signs Blood pressure (!) 101/50, pulse 65, temperature 97.7 F (36.5 C), resp. rate 19, height '5\' 6"'$  (1.676 m), weight 70.3 kg, SpO2 97 %.    Constitutional: No distress . Vital signs reviewed. HEENT: NCAT, EOMI, oral membranes moist Neck: supple Cardiovascular: RRR w/ PAC's, without murmur. No JVD    Respiratory/Chest: CTA Bilaterally without wheezes or rales. Sl dyspnea    GI/Abdomen: BS +, non-tender, non-distended Ext: no clubbing, cyanosis, or edema Psych: pleasant and cooperative, sl anxious Skin: Clean and intact without signs of breakdown Neuro:  alert oriented to person,hospital, reason. Much more focused and appropriate.  Follows commands. Language improving.. Minimal right central 7.  RUE 3-4/5 at least. RLE 3 to 3+/5. Sensation intact for pain and LT Musculoskeletal: left shoulder less tender. No leg pain      Assessment/Plan: 1. Functional deficits which require 3+ hours per day of interdisciplinary therapy in a comprehensive inpatient rehab setting. Physiatrist is providing close team supervision and 24 hour management of active medical  problems listed below. Physiatrist and rehab team continue to assess barriers to discharge/monitor patient progress toward functional and medical goals  Care Tool:  Bathing    Body parts bathed by patient: Right arm, Left arm, Chest, Abdomen, Face   Body parts bathed by helper: Buttocks     Bathing assist Assist Level: Minimal Assistance - Patient > 75%     Upper Body Dressing/Undressing Upper body dressing   What is the patient wearing?: Pull over shirt    Upper body assist Assist Level: Minimal Assistance - Patient > 75%    Lower Body Dressing/Undressing Lower body dressing      What is the patient wearing?: Pants, Incontinence brief     Lower body assist Assist for lower body dressing: Moderate Assistance - Patient 50 - 74%     Toileting Toileting    Toileting assist Assist for toileting: Minimal Assistance - Patient > 75%     Transfers Chair/bed transfer  Transfers assist     Chair/bed transfer assist level: Moderate Assistance - Patient 50 - 74%     Locomotion Ambulation   Ambulation assist   Ambulation activity did not occur: Safety/medical concerns  Assist level:  Moderate Assistance - Patient 50 - 74% Assistive device: Walker-Eva Max distance: 45   Walk 10 feet activity   Assist  Walk 10 feet activity did not occur: Safety/medical concerns  Assist level: Moderate Assistance - Patient - 50 - 74% Assistive device: Walker-Eva   Walk 50 feet activity   Assist Walk 50 feet with 2 turns activity did not occur: Safety/medical concerns         Walk 150 feet activity   Assist Walk 150 feet activity did not occur: Safety/medical concerns         Walk 10 feet on uneven surface  activity   Assist Walk 10 feet on uneven surfaces activity did not occur: Safety/medical concerns         Wheelchair     Assist Is the patient using a wheelchair?: Yes Type of Wheelchair: Manual    Wheelchair assist level: Minimal Assistance -  Patient > 75% Max wheelchair distance: 25    Wheelchair 50 feet with 2 turns activity    Assist        Assist Level: Maximal Assistance - Patient 25 - 49% (Per PT documentation of 20 feet)   Wheelchair 150 feet activity     Assist      Assist Level: Total Assistance - Patient < 25% (Per PT documentation of 20 feet)   Blood pressure (!) 101/50, pulse 65, temperature 97.7 F (36.5 C), resp. rate 19, height '5\' 6"'$  (1.676 m), weight 70.3 kg, SpO2 97 %.  Medical Problem List and Plan: 1. Functional deficits secondary to left thalamic intracranial hemorrhage             -patient may shower             -ELOS/Goals: 14-16 days, supervision to min assist -Have reduced intensity to 15/7  - mental status improved   -minimizing neurosedating meds, maximize sleep  -Continue CIR therapies including PT, OT, and SLP  2.  Antithrombotics: -DVT/anticoagulation:  Pharmaceutical: Heparin             -antiplatelet therapy: none 3. Pain: continue Tylenol as needed.    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 Mood/sleep: team providing ego support             -antipsychotic agents: n/a  -family is providing  emotional support as well  -melatonin dc'ed d/t questionable side effects 5. Neuropsych: This patient is not capable of making decisions on her own behalf. 6. Skin/Wound Care: Routine skin care checks 7. Fluids/Electrolytes/Nutrition:               --encouraging PO  - intake reasonable  -  Boost Breeze to improve protein 8. Hypertension: continue diltiazem, hydralazine, nadolol. Home losartan held  - Holding nadolol prn for hypotension   -have decreased hydralazine to '25mg'$    6/13 bp soft. Decrease hydralazine to '10mg'$  tid 9:  Hyperlipidemia: continue atorvastatin 10: GI prophylaxis: continue pantoprazole 11: pAF: continue diltiazem. No AC until resolution of ICH, outpt follow-up  -suspect some of sob is d/t age/fatigue. Bp,anemia also playing roles  -HR has been controlled 12: Hypothyroidism: continue Synthroid 13: Urinary retention: continue Flomax             - now voiding without caths   -continue  urecholine '10mg'$  tid  14: UTI:  Keflex completed  -6/13- repeat ucx w/ Citrobacter >100k- fosfomycin x 1 on Sunday  15: AKI: BUN and Cr  have improved             -encouraging fluids.  16: Leukocytosis: wbc's normal.   17: Anemia, chronic: no evidence of acute blood loss -hgb stable at 8.6 18: Polymyalgia rheumatica: prednisone 2 mg with lunch 19. Dysphagia:               -advanced to reg/thins per SLP 20. macular degeneration of the right eye, uses preservision at home. - improved  Continue use of artifical tears and Prosight.    LOS: 21 days A FACE TO FACE EVALUATION WAS PERFORMED  Meredith Staggers 04/23/2022, 9:13 AM

## 2022-04-23 NOTE — Patient Care Conference (Signed)
Inpatient RehabilitationTeam Conference and Plan of Care Update Date: 04/23/2022   Time: 10:31 AM    Patient Name: [name]      Medical Record Number: [id number]  Date of Birth: 04-07-1923 Sex: Female         Room/Bed: 4M02C/4M02C-01 Payor Info: Payor: [employer] MEDICARE / Plan: [place] PPO / Product Type: *No Product type* /    Admit Date/Time:  04/02/2022  4:45 PM  Primary Diagnosis:  ICH (intracerebral hemorrhage) [facility])  Hospital Problems: Principal Problem:   ICH (intracerebral hemorrhage) ([place])    Expected Discharge Date: Expected Discharge Date: 04/30/22  Team Members Present: Physician leading conference: Dr. Alger Simons Social Worker Present: Loralee Pacas, [place] Nurse Present: Dorthula Nettles, RN PT Present: Tereasa Coop, PT OT Present: Cherylynn Ridges, OT SLP Present: Weston Anna, SLP PPS Coordinator present : Gunnar Fusi, SLP     Current Status/Progress Goal Weekly Team Focus  Bowel/Bladder   continent b/b  remain continent  toilet as needed   Swallow/Nutrition/ Hydration   Regular textures with thin liquids, full supervision  Mod I  tolerance of current diet, use of swallowing compensatory strategies   ADL's   Supervision UB bathing/dressing, min A LB bathing/dressing, mod A transfers, min/mod A transferes  Min A  R NMR, self-care retraining, dc planning , activity tolerance   Mobility   minA bed mobility and sit to stand, modA bed to chair transfer, modA overall for gait x50' with RW, maxA with turns  min A overall  family ed, dc prep, ambulation, stair training   Communication   Supervision-Mod I  mod I speech intelligiblity; sup A word finding  focus on breath support   Safety/Cognition/ Behavioral Observations  Min A  sup A  basic problem solving, recall with use of strategies and anticipatory awareness   Pain   no reported pain  < 3  assess pain q 4hr and prn   Skin   right arm skin tear  no new breakdown  assess skin q  shift and prn     Discharge Planning:  D/c tohome with hired caregivers 4 days per week 9am-3pm; and rotating support from children .   Team Discussion: Doing better at night. Adjusting medications. Reports SOB however, oxygen saturation is WDL, may be BP related. Still reporting fatigue and anxiety. MD spoke with family Saturday. Continent B/B, no reported pain. Right arm skin tear, covered with foam. Will schedule family education. Discharging home with family, hired aides in place. Return to full schedule vs 15/7.  Patient on target to meet rehab goals: yes, min assist goals. Currently mod assist transfers and ambulating. Supervision upper body, min/mod assist lower body. Min assist bed mobility. Mod/max assist with transitions. Becomes SOB while speaking, memory declined. Will begin RMT.   *See Care Plan and progress notes for long and short-term goals.   Revisions to Treatment Plan:  Adjusting medications, adjusting therapy schedule  Teaching Needs: Family education, medication management, skin/wound care, transfer/gait training, etc.   Current Barriers to Discharge: Home enviroment access/layout and Wound care  Possible Resolutions to Barriers: Family education Follow-up therapy Order recommended DME     Medical Summary Current Status: improved anxiety and sleep. cognition/arousal better as well. still some sob which is anxiety, bp related perhaps, may be anemia too  Barriers to Discharge: Medical stability   Possible Resolutions to [employer] Focus: daily assessment of labs and pt data   Continued Need for Acute Rehabilitation Level of Care: The patient requires  daily medical management by a physician with specialized training in physical medicine and rehabilitation for the following reasons: Direction of a multidisciplinary physical rehabilitation program to maximize functional independence : Yes Medical management of patient stability for increased activity during  participation in an intensive rehabilitation regime.: Yes Analysis of laboratory values and/or radiology reports with any subsequent need for medication adjustment and/or medical intervention. : Yes   I attest that I was present, lead the team conference, and concur with the assessment and plan of the team.   Cristi Loron 04/23/2022, 1:58 PM

## 2022-04-23 NOTE — Plan of Care (Signed)
  Problem: RH Problem Solving Goal: LTG Patient will demonstrate problem solving for (SLP) Description: LTG:  Patient will demonstrate problem solving for basic/complex daily situations with cues  (SLP) Flowsheets (Taken 04/23/2022 [phone]) LTG Patient will demonstrate problem solving for: Supervision Note: Goal downgraded    Problem: RH Memory Goal: LTG Patient will use memory compensatory aids to (SLP) Description: LTG:  Patient will use memory compensatory aids to recall biographical/new, daily complex information with cues (SLP) Flowsheets (Taken 04/23/2022 4327) LTG: Patient will use memory compensatory aids to (SLP): Supervision Note: Goal downgraded

## 2022-04-24 NOTE — Progress Notes (Signed)
PROGRESS NOTE   Subjective/Complaints:  Still SOB with exertion. Bp's remain soft. Leg pain better last night. Slept well  ROS: Patient denies fever, rash, sore throat, blurred vision, dizziness, nausea, vomiting, diarrhea, cough, shortness of breath or chest pain, joint or back/neck pain, headache .    Objective:   No results found.  Recent Labs    04/22/22 0513  WBC 6.2  HGB 8.6*  HCT 27.6*  PLT 242      Recent Labs    04/21/22 0900 04/22/22 0513  NA 138 141  K 4.5 4.4  CL 105 109  CO2 24 25  GLUCOSE 128* 103*  BUN 23 22  CREATININE 1.44* 1.42*  CALCIUM 9.2 9.1       Intake/Output Summary (Last 24 hours) at 04/24/2022 0757 Last data filed at 04/23/2022 1804 Gross per 24 hour  Intake 480 ml  Output --  Net 480 ml        Physical Exam: Vital Signs Blood pressure (!) 149/62, pulse 76, temperature 97.9 F (36.6 C), temperature source Oral, resp. rate 16, height '5\' 6"'$  (1.676 m), weight 70.3 kg, SpO2 98 %.    Constitutional: No distress . Vital signs reviewed. HEENT: NCAT, EOMI, oral membranes moist Neck: supple Cardiovascular: RRR w/ PAC's, without murmur. No JVD    Respiratory/Chest: CTA Bilaterally without wheezes or rales. Sl dyspnea    GI/Abdomen: BS +, non-tender, non-distended Ext: no clubbing, cyanosis, or edema Psych: pleasant and cooperative, sl anxious Skin: Clean and intact without signs of breakdown Neuro:  alert oriented to person,hospital, reason. Much more focused and appropriate.  Follows commands. Language improving.. Minimal right central 7.  RUE 3-4/5 at least. RLE 3 to 3+/5. Sensation intact for pain and LT Musculoskeletal: left shoulder less tender. No leg pain      Assessment/Plan: 1. Functional deficits which require 3+ hours per day of interdisciplinary therapy in a comprehensive inpatient rehab setting. Physiatrist is providing close team supervision and 24 hour  management of active medical problems listed below. Physiatrist and rehab team continue to assess barriers to discharge/monitor patient progress toward functional and medical goals  Care Tool:  Bathing    Body parts bathed by patient: Right arm, Left arm, Chest, Abdomen, Face   Body parts bathed by helper: Buttocks     Bathing assist Assist Level: Minimal Assistance - Patient > 75%     Upper Body Dressing/Undressing Upper body dressing   What is the patient wearing?: Pull over shirt    Upper body assist Assist Level: Minimal Assistance - Patient > 75%    Lower Body Dressing/Undressing Lower body dressing      What is the patient wearing?: Pants, Incontinence brief     Lower body assist Assist for lower body dressing: Moderate Assistance - Patient 50 - 74%     Toileting Toileting    Toileting assist Assist for toileting: Minimal Assistance - Patient > 75%     Transfers Chair/bed transfer  Transfers assist     Chair/bed transfer assist level: Moderate Assistance - Patient 50 - 74%     Locomotion Ambulation   Ambulation assist   Ambulation activity did not occur: Safety/medical concerns  Assist level: Moderate Assistance - Patient 50 - 74% Assistive device: Walker-Eva Max distance: 45   Walk 10 feet activity   Assist  Walk 10 feet activity did not occur: Safety/medical concerns  Assist level: Moderate Assistance - Patient - 50 - 74% Assistive device: Walker-Eva   Walk 50 feet activity   Assist Walk 50 feet with 2 turns activity did not occur: Safety/medical concerns         Walk 150 feet activity   Assist Walk 150 feet activity did not occur: Safety/medical concerns         Walk 10 feet on uneven surface  activity   Assist Walk 10 feet on uneven surfaces activity did not occur: Safety/medical concerns         Wheelchair     Assist Is the patient using a wheelchair?: Yes Type of Wheelchair: Manual    Wheelchair assist  level: Minimal Assistance - Patient > 75% Max wheelchair distance: 25    Wheelchair 50 feet with 2 turns activity    Assist        Assist Level: Maximal Assistance - Patient 25 - 49% (Per PT documentation of 20 feet)   Wheelchair 150 feet activity     Assist      Assist Level: Total Assistance - Patient < 25% (Per PT documentation of 20 feet)   Blood pressure (!) 149/62, pulse 76, temperature 97.9 F (36.6 C), temperature source Oral, resp. rate 16, height '5\' 6"'$  (1.676 m), weight 70.3 kg, SpO2 98 %.  Medical Problem List and Plan: 1. Functional deficits secondary to left thalamic intracranial hemorrhage             -patient may shower             -ELOS/Goals: 14-16 days, supervision to min assist  - mental status improved   -minimizing neurosedating meds, maximize sleep  -Continue CIR therapies including PT, OT, and SLP   -family now considering SNF given her projected care needs. Will discuss with them myself as well.  2.  Antithrombotics: -DVT/anticoagulation:  Pharmaceutical: Heparin             -antiplatelet therapy: none 3. Pain: continue Tylenol as needed.    -xray of left shoulder demonstrates significant DJD, old humeral neck fx  -added voltaren gel to left shoulder with benefit  -rom to tolerance, discussed with patient the importance maintaining rom  -6/13 reduced gabapentin back to '200mg'$    -tylenol scheduled at 2000  -6/14 overall her pain improved 4. Mood/sleep: team providing ego support             -antipsychotic agents: n/a  -family is providing  emotional support as well  -melatonin dc'ed d/t questionable side effects 5. Neuropsych: This patient is not capable of making decisions on her own behalf. 6. Skin/Wound Care: Routine skin care checks 7. Fluids/Electrolytes/Nutrition:               --encouraging PO  - intake reasonable  -  Boost Breeze to improve protein 8. Hypertension: continue diltiazem, hydralazine, nadolol. Home losartan held  -  Holding nadolol prn for hypotension   -have decreased hydralazine to '25mg'$    6/14 bp has been soft. Slightly elevated today. Obsv for trend 9: Hyperlipidemia: continue atorvastatin 10: GI prophylaxis: continue pantoprazole 11: pAF: continue diltiazem. No AC until resolution of ICH, outpt follow-up  -suspect some of sob is d/t age/fatigue. Bp,anemia also playing roles  -HR has been controlled 12: Hypothyroidism: continue Synthroid 13:  Urinary retention: continue Flomax             -now voiding without caths   -continue urecholine '10mg'$  tid  14: UTI:  Keflex completed  -6/13- repeat ucx w/ Citrobacter >100k- fosfomycin x 1 on Sunday  15: AKI: BUN and Cr  have improved             -pt's intake has improved  16: Leukocytosis: wbc's normal.   17: Anemia, chronic: no evidence of acute blood loss -hgb stable at 8.6 18: Polymyalgia rheumatica: prednisone 2 mg with lunch 19. Dysphagia:               -advanced to reg/thins per SLP 20. macular degeneration of the right eye, uses preservision at home. - improved  Continue use of artifical tears and Prosight.    LOS: 22 days A FACE TO [place] 04/24/2022, 7:57 AM

## 2022-04-24 NOTE — Progress Notes (Signed)
Speech Language Pathology Daily Session Note  Patient Details  Name: STARLYN DROGE MRN: [id number] Date of Birth: [birth year]  Today's Date: 04/24/2022 SLP Individual Time: 1330-1413 SLP Individual Time Calculation (min): 43 min  Short Term Goals: Week 4: SLP Short Term Goal 1 (Week 4): STGs=LTGs due to ELOS  Skilled Therapeutic Interventions: Skilled treatment session focused  on cognitive and communication goals. Upon arrival, patient was awake and alert in the recliner. SLP facilitated session by providing Min A verbal cues for problem solving during transfer to the wheelchair via the [place]. Patient participated in a navigation task and required Min verbal cues for problem solving and use of memory compensatory strategies throughout. Patient asked for directions multiple times with unfamiliar communication partners and was overall 100% intelligible. Patient requested to use the bathroom and was transferred via the [facility]. Patient handed off to NT. Continue with current plan of care.      Pain No/Denies Pain   Therapy/Group: Individual Therapy  Deamber Buckhalter 04/24/2022, 3:41 PM

## 2022-04-24 NOTE — Progress Notes (Signed)
Patient ID: [name], female   DOB: Mar 26, 1923, 88 y.o.   MRN: [id number]  SW sent out SNF referral.   Loralee Pacas, MSW, [place] Office: [phone] Cell: [phone] Fax: [phone]

## 2022-04-24 NOTE — Progress Notes (Signed)
Occupational Therapy Session Note  Patient Details  Name: [name] MRN: [id number] Date of Birth: Jul 23, 1923  Today's Date: 04/24/2022 OT Individual Time: 8916-9450 OT Individual Time Calculation (min): 77 min    Short Term Goals: Week 4:  OT Short Term Goal 1 (Week 4): LTG=STG 2/2 ELOS  Skilled Therapeutic Interventions/Progress Updates:  Pt greeted supine in bed   agreeable to OT intervention. Session focus on BADL reeducation, functional mobility, dynamic standing balance, RLE NMR, RUE coordination and decreasing overall caregiver burden.       Pt completed supine>sit with CGA, MAX A for stand pivot with RW with pt presenting with R lateral lean in standing and ataxia in BLEs. Pt transproted to toilet in w/c with total A d/t urgency, MOD A squat pivot to BSC over toilet from w/c. + bowel void with pt needing MAX A for 3/3 toiletign tasks, utilized stedy for standing while OTA completed posterior pericare. Pt transproted to w/c in stedy for time mgmt.  Pt completed wash up at sink with overall MIN A able to complete LBbathing from sitting and only needing assist d/t RUE incoordination. Pt donned [place] shirt with MOD A, MAX A for LB dressing and total A to don shoes and TEDS.  Pt completed seated oral care with set - up assist.  Total A transport to gym in w/c where pt completed various therapeutic activities focused on standing balance, midline orientation, RUE coordination, RLE NMR and dynamic balance. - pt instructed to sit>stand from EOM with overall MODA pt; pt able to reach with RUE to mirror to retrive clothespins with an emphasis on RUE coordination, midline posture, standing balance and motor planning, pt needed increased time and effort to manipulate clothespins but able to do so with overall MOD A for dynamic balance.  - pt did have x2 episodes of R knee buckling; pt frustrated about this but reprots this was baseline.   Pt completed stand pivot back to w/c with MOD A with Rw. Pt  transported back to room with total A where pt completed squat pivot back to recliner with MOD A.    pt left seated in recliner with alarm belt activated and all needs within reach.                       Therapy Documentation Precautions:  Precautions Precautions: Fall Precaution Comments: R hemi Restrictions Weight Bearing Restrictions: No  Pain: R knee pain reported post standing, rest breaks and repositioning provided as needed, discussed with PT about potentially ordering R knee brace for increased support to manage pain .     Therapy/Group: Individual Therapy  Corinne Ports [facility] 04/24/2022, 10:42 AM

## 2022-04-24 NOTE — Progress Notes (Incomplete)
Patient ID: [name], female   DOB: 1923/10/31, 88 y.o.   MRN: [id number]     Loralee Pacas, MSW, [place] Office: [phone] Cell: [phone] Fax: [phone]

## 2022-04-24 NOTE — Progress Notes (Signed)
Physical Therapy Session Note  Patient Details  Name: KEYON WINNICK MRN: [id number] Date of Birth: [birth year]  Today's Date: 04/24/2022 PT Individual Time: 1050-1200 PT Individual Time Calculation (min): 70 min   Short Term Goals: Week 3:  PT Short Term Goal 1 (Week 3): STGs = LTGs  Skilled Therapeutic Interventions/Progress Updates:     Pt received with RN staff in room. Agrees to therapy and no complaint of pain. Sit to stand with RW and minA/modA, with cues for hand placement and sequencing. Pt performs stand step transfer to Wc with modA and cues for lateral weight shifting, especially to the L. WC transport to gym for time management.   Pt completes x10:00 total on Nustep, at workload of 4 with average steps per minute ~35. Pt requires 4-5 seated rest breaks. Activity completed for strength and endurance training.  Pt ambulates x75' with EVA walker and R posterior strut AFO, initial 22' with modA and final 25' with maxA and increased ataxia and difficulty adequately weidht shifting to the L to advance R lower extremity. WC required to be brought to pt secondary to difficulty with safe sequencing of steps and maintaining COG over BOS.   WC transport back to room. Stand step transfer to recliner with modA and cues for sequencing and hand placement. Left with alarm intact and all needs within reach.  Therapy Documentation Precautions:  Precautions Precautions: Fall Precaution Comments: R hemi Restrictions Weight Bearing Restrictions: No   Therapy/Group: Individual Therapy  Breck Coons, PT, DPT 04/24/2022, 12:50 PM

## 2022-04-24 NOTE — NC FL2 (Signed)
[place] MEDICAID FL2 LEVEL OF CARE SCREENING TOOL     IDENTIFICATION  Patient Name: [name] Birthdate: [birth year] Sex: female Admission Date (Current Location): 04/02/2022  [facility] and [place] Number:  Herbalist and Address:  The [place], [place] [address] [place], [place] [address]      Provider Number: [id number]  Attending Physician Name and Address:  Meredith Staggers, MD  Relative Name and Phone Number:  Lubertha Sayres (daughter) [phone]    Current Level of Care: Hospital Recommended Level of Care: [place] Prior Approval Number:    Date Approved/Denied:   PASRR Number: [id number]  Discharge Plan: SNF    Current Diagnoses: Patient Active Problem List   Diagnosis Date Noted   ICH (intracerebral hemorrhage) ([place]) 03/25/2022   Hypertensive emergency    Osteoporosis 10/24/2021   Chronic anticoagulation 01/17/2021   Parastomal hernia s/p colostomy takedown & repair 01/17/2021 01/17/2021   Polymyalgia rheumatica ([place]) 01/11/2021   (HFpEF) heart failure with preserved ejection fraction ([place]) 10/27/2020   Injury of extensor or abductor muscle, fascia, or tendon of thumb at forearm level 08/03/2020   Orthostatic hypotension 05/24/2020   Acute blood loss anemia 05/19/2020   Traumatic hematoma of left hip 05/19/2020   Current chronic use of systemic steroids 05/19/2020   Advanced age 20/12/2019   Secondary hypercoagulable state ([place]) 03/08/2020   Hypokalemia 02/15/2020   History of diverticulitis of colon with perforation 01/26/2020   Anemia    Debility 01/14/2020   Physical debility 01/14/2020   Chronic atrial fibrillation ([place])    Recurrent falls 12/29/2019   Positive ANA (antinuclear antibody) 12/29/2019   Osteoarthritis 11/13/2017   Hypertriglyceridemia 01/29/2017   Excessive gas 01/09/2017   Physical exam 07/31/2016   Elevated lipase 03/06/2016   Chest pain 03/06/2016   Hypothyroidism 01/26/2016    HTN (hypertension) 01/26/2016   Left leg numbness 10/06/2012    Orientation RESPIRATION BLADDER Height & Weight     Self, Time, Situation, Place  Normal Continent Weight: 154 lb 15.7 oz (70.3 kg) Height:  '5\' 6"'$  (167.6 cm)  BEHAVIORAL SYMPTOMS/MOOD NEUROLOGICAL BOWEL NUTRITION STATUS      Continent Diet  AMBULATORY STATUS COMMUNICATION OF NEEDS Skin   Limited Assist Verbally Other (Comment) (skin tear on arm and open to air)                       Personal Care Assistance Level of Assistance  Bathing, Dressing Bathing Assistance: Limited assistance   Dressing Assistance: Limited assistance     Functional Limitations Info  Sight, Hearing, Speech Sight Info: Adequate Hearing Info: Adequate Speech Info: Adequate (Sometimes shortness of breath when communicating)    [place]  PT (By licensed PT), OT (By licensed OT), Speech therapy     PT Frequency: 5xs per week OT Frequency: 5xs per week     Speech Therapy Frequency: 5xs per week      Contractures Contractures Info: Not present    Additional Factors Info  Code Status, Allergies Code Status Info: Full Allergies Info: Keflex (cephalexin) Medium- Nausea Only/Pt ended up in ER w/ CHEST PAIN.  Patient experienced chest pain again with re-challenge of cephalexin in May 2023.  Codeine- "Nightmares, imagined things"           Current Medications (04/24/2022):  This is the current hospital active medication list Current Facility-Administered Medications  Medication Dose Route Frequency Provider Last Rate Last Admin   acetaminophen (  TYLENOL) tablet 325-650 mg  325-650 mg Oral Q4H PRN Barbie Banner, PA-C   325 mg at 04/24/22 0533   acetaminophen (TYLENOL) tablet 650 mg  650 mg Oral Daily Meredith Staggers, MD   650 mg at 04/23/22 1949   alum & mag hydroxide-simeth (MAALOX/MYLANTA) 200-200-20 MG/5ML suspension 30 mL  30 mL Oral Q4H PRN Barbie Banner, PA-C   30 mL at 04/04/22 1728    atorvastatin (LIPITOR) tablet 20 mg  20 mg Oral Daily Barbie Banner, PA-C   20 mg at 04/24/22 4098   bethanechol (URECHOLINE) tablet 10 mg  10 mg Oral TID Meredith Staggers, MD   10 mg at 04/24/22 1191   bisacodyl (DULCOLAX) EC tablet 5 mg  5 mg Oral Daily PRN Barbie Banner, PA-C   5 mg at 04/15/22 1249   diclofenac Sodium (VOLTAREN) 1 % topical gel 2 g  2 g Topical TID Meredith Staggers, MD   2 g at 04/24/22 0815   diltiazem (CARDIZEM CD) 24 hr capsule 120 mg  120 mg Oral QPM Barbie Banner, PA-C   120 mg at 04/23/22 1750   feeding supplement (BOOST / RESOURCE BREEZE) liquid 1 Container  1 Container Oral TID BM Meredith Staggers, MD   1 Container at 04/24/22 0818   gabapentin (NEURONTIN) capsule 200 mg  200 mg Oral QHS Alger Simons T, MD   200 mg at 04/23/22 1947   guaiFENesin-dextromethorphan (ROBITUSSIN DM) 100-10 MG/5ML syrup 5-10 mL  5-10 mL Oral Q6H PRN Barbie Banner, PA-C       heparin injection 5,000 Units  5,000 Units Subcutaneous Q8H Barbie Banner, PA-C   5,000 Units at 04/24/22 0533   hydrALAZINE (APRESOLINE) tablet 10 mg  10 mg Oral Q8H Meredith Staggers, MD   10 mg at 04/24/22 0533   levothyroxine (SYNTHROID) tablet 50 mcg  50 mcg Oral Daily Barbie Banner, PA-C   50 mcg at 04/24/22 0533   multivitamin (PROSIGHT) tablet 1 tablet  1 tablet Oral Daily Luetta Nutting, FNP   1 tablet at 04/24/22 0814   nadolol (CORGARD) tablet 80 mg  80 mg Oral Daily Meredith Staggers, MD   80 mg at 04/24/22 0816   nitroGLYCERIN (NITROSTAT) SL tablet 0.4 mg  0.4 mg Sublingual Q5 min PRN Bayard Hugger, NP   0.4 mg at 04/04/22 1810   pantoprazole (PROTONIX) EC tablet 40 mg  40 mg Oral Daily Barbie Banner, PA-C   40 mg at 04/24/22 4782   polyvinyl alcohol (LIQUIFILM TEARS) 1.4 % ophthalmic solution 1 drop  1 drop Right Eye PRN Luetta Nutting, FNP   1 drop at 04/24/22 1230   predniSONE (DELTASONE) tablet 2 mg  2 mg Oral Q lunch Barbie Banner, PA-C   2 mg at 04/24/22 1227   prochlorperazine  (COMPAZINE) tablet 5-10 mg  5-10 mg Oral Q6H PRN Barbie Banner, PA-C       Or   prochlorperazine (COMPAZINE) injection 5-10 mg  5-10 mg Intramuscular Q6H PRN Barbie Banner, PA-C       Or   prochlorperazine (COMPAZINE) suppository 12.5 mg  12.5 mg Rectal Q6H PRN Setzer, [address], PA-C       senna-docusate (Senokot-S) tablet 1 tablet  1 tablet Oral QHS PRN Barbie Banner, PA-C       sodium phosphate (FLEET) 7-19 GM/118ML enema 1 enema  1 enema Rectal Once PRN Setzer, [address], PA-C  tamsulosin (FLOMAX) capsule 0.4 mg  0.4 mg Oral Daily Barbie Banner, PA-C   0.4 mg at 04/24/22 0929     Discharge Medications: Please see discharge summary for a list of discharge medications.  Relevant Imaging Results:  Relevant Lab Results:   Additional Information VF#[id number]  UKRCV K [id number], LCSW

## 2022-04-25 ENCOUNTER — Inpatient Hospital Stay (HOSPITAL_COMMUNITY): Payer: Medicare PPO

## 2022-04-25 DIAGNOSIS — I480 Paroxysmal atrial fibrillation: Secondary | ICD-10-CM

## 2022-04-25 DIAGNOSIS — I5030 Unspecified diastolic (congestive) heart failure: Secondary | ICD-10-CM

## 2022-04-25 DIAGNOSIS — I358 Other nonrheumatic aortic valve disorders: Secondary | ICD-10-CM

## 2022-04-25 LAB — CBC
HCT: 31.3 % — ABNORMAL LOW (ref 36.0–46.0)
Hemoglobin: 9.9 g/dL — ABNORMAL LOW (ref 12.0–15.0)
MCH: 28.9 pg (ref 26.0–34.0)
MCHC: 31.6 g/dL (ref 30.0–36.0)
MCV: 91.3 fL (ref 80.0–100.0)
Platelets: 266 10*3/uL (ref 150–400)
RBC: 3.43 MIL/uL — ABNORMAL LOW (ref 3.87–5.11)
RDW: 15.9 % — ABNORMAL HIGH (ref 11.5–15.5)
WBC: 7.7 10*3/uL (ref 4.0–10.5)
nRBC: 0 % (ref 0.0–0.2)

## 2022-04-25 LAB — COMPREHENSIVE METABOLIC PANEL
ALT: 17 U/L (ref 0–44)
AST: 15 U/L (ref 15–41)
Albumin: 3.4 g/dL — ABNORMAL LOW (ref 3.5–5.0)
Alkaline Phosphatase: 71 U/L (ref 38–126)
Anion gap: 9 (ref 5–15)
BUN: 20 mg/dL (ref 8–23)
CO2: 24 mmol/L (ref 22–32)
Calcium: 9.6 mg/dL (ref 8.9–10.3)
Chloride: 107 mmol/L (ref 98–111)
Creatinine, Ser: 1.47 mg/dL — ABNORMAL HIGH (ref 0.44–1.00)
GFR, Estimated: 32 mL/min — ABNORMAL LOW (ref >60)
Glucose, Bld: 191 mg/dL — ABNORMAL HIGH (ref 70–99)
Potassium: 4.5 mmol/L (ref 3.5–5.1)
Sodium: 140 mmol/L (ref 135–145)
Total Bilirubin: 0.5 mg/dL (ref 0.3–1.2)
Total Protein: 5.9 g/dL — ABNORMAL LOW (ref 6.5–8.1)

## 2022-04-25 LAB — BRAIN NATRIURETIC PEPTIDE: B Natriuretic Peptide: 416 pg/mL — ABNORMAL HIGH (ref 0.0–100.0)

## 2022-04-25 LAB — MAGNESIUM: Magnesium: 2 mg/dL (ref 1.7–2.4)

## 2022-04-25 NOTE — Progress Notes (Signed)
PROGRESS NOTE   Subjective/Complaints:  Pt had a good night. Asked what family ed today would be like. Still feels sob at times. Says she's working on breathing technique  ROS: Patient denies fever, rash, sore throat, blurred vision, dizziness, nausea, vomiting, diarrhea, cough,   chest pain, joint or back/neck pain, headache, or mood change.   Objective:   No results found.  No results for input(s): "WBC", "HGB", "HCT", "PLT" in the last 72 hours.     No results for input(s): "NA", "K", "CL", "CO2", "GLUCOSE", "BUN", "CREATININE", "CALCIUM" in the last 72 hours.      Intake/Output Summary (Last 24 hours) at 04/25/2022 0905 Last data filed at 04/25/2022 0820 Gross per 24 hour  Intake 936 ml  Output 200 ml  Net 736 ml        Physical Exam: Vital Signs Blood pressure (!) 155/61, pulse 63, temperature 98.1 F (36.7 C), resp. rate 18, height '5\' 6"'$  (1.676 m), weight 70.3 kg, SpO2 95 %.    Constitutional: No distress . Vital signs reviewed. HEENT: NCAT, EOMI, oral membranes moist Neck: supple Cardiovascular: RRR without murmur. No JVD    Respiratory/Chest: CTA Bilaterally without wheezes or rales. Normal effort    GI/Abdomen: BS +, non-tender, non-distended Ext: no clubbing, cyanosis, or edema Psych: pleasant and cooperative  Skin: Clean and intact without signs of breakdown Neuro:  alert oriented to person,hospital, reason. Much more focused and appropriate.  Follows commands. Language improving.. Minimal right central 7.  RUE 3-4/5 at least. RLE 3 to 3+/5. Sensation intact for pain and LT Musculoskeletal: left shoulder less tender. No leg pain      Assessment/Plan: 1. Functional deficits which require 3+ hours per day of interdisciplinary therapy in a comprehensive inpatient rehab setting. Physiatrist is providing close team supervision and 24 hour management of active medical problems listed  below. Physiatrist and rehab team continue to assess barriers to discharge/monitor patient progress toward functional and medical goals  Care Tool:  Bathing    Body parts bathed by patient: Right arm, Left arm, Chest, Abdomen, Front perineal area, Buttocks, Right upper leg, Left upper leg, Face   Body parts bathed by helper: Buttocks     Bathing assist Assist Level: Minimal Assistance - Patient > 75%     Upper Body Dressing/Undressing Upper body dressing   What is the patient wearing?: Pull over shirt    Upper body assist Assist Level: Moderate Assistance - Patient 50 - 74%    Lower Body Dressing/Undressing Lower body dressing      What is the patient wearing?: Pants, Incontinence brief     Lower body assist Assist for lower body dressing: Maximal Assistance - Patient 25 - 49%     Toileting Toileting    Toileting assist Assist for toileting: Maximal Assistance - Patient 25 - 49%     Transfers Chair/bed transfer  Transfers assist     Chair/bed transfer assist level: Moderate Assistance - Patient 50 - 74% (stand pivot with RW)     Locomotion Ambulation   Ambulation assist   Ambulation activity did not occur: Safety/medical concerns  Assist level: Moderate Assistance - Patient 50 - 74% Assistive device:  Walker-Eva Max distance: 45   Walk 10 feet activity   Assist  Walk 10 feet activity did not occur: Safety/medical concerns  Assist level: Moderate Assistance - Patient - 50 - 74% Assistive device: Walker-Eva   Walk 50 feet activity   Assist Walk 50 feet with 2 turns activity did not occur: Safety/medical concerns         Walk 150 feet activity   Assist Walk 150 feet activity did not occur: Safety/medical concerns         Walk 10 feet on uneven surface  activity   Assist Walk 10 feet on uneven surfaces activity did not occur: Safety/medical concerns         Wheelchair     Assist Is the patient using a wheelchair?:  Yes Type of Wheelchair: Manual    Wheelchair assist level: Minimal Assistance - Patient > 75% Max wheelchair distance: 25    Wheelchair 50 feet with 2 turns activity    Assist        Assist Level: Maximal Assistance - Patient 25 - 49% (Per PT documentation of 20 feet)   Wheelchair 150 feet activity     Assist      Assist Level: Total Assistance - Patient < 25% (Per PT documentation of 20 feet)   Blood pressure (!) 155/61, pulse 63, temperature 98.1 F (36.7 C), resp. rate 18, height '5\' 6"'$  (1.676 m), weight 70.3 kg, SpO2 95 %.  Medical Problem List and Plan: 1. Functional deficits secondary to left thalamic intracranial hemorrhage             -patient may shower             -ELOS/Goals: 14-16 days, supervision to min assist  - mental status improved   -minimizing neurosedating meds, maximize sleep  --Continue CIR therapies including PT, OT, and SLP   -family now considering SNF given her projected care needs. Family ed 2.  Antithrombotics: -DVT/anticoagulation:  Pharmaceutical: Heparin             -antiplatelet therapy: none 3. Pain: continue Tylenol as needed.    -xray of left shoulder demonstrates significant DJD, old humeral neck fx  -added voltaren gel to left shoulder with benefit  -rom to tolerance, discussed with patient the importance maintaining rom  -6/13 reduced gabapentin back to '200mg'$    -tylenol scheduled at 2000  -6/15 overall her pain improved 4. Mood/sleep: team providing ego support             -antipsychotic agents: n/a  -family is providing  emotional support as well  -melatonin dc'ed d/t questionable side effects 5. Neuropsych: This patient is not capable of making decisions on her own behalf. 6. Skin/Wound Care: Routine skin care checks 7. Fluids/Electrolytes/Nutrition:               --encouraging PO  - intake reasonable  -  Boost Breeze to improve protein 8. Hypertension: continue diltiazem, hydralazine, nadolol. Home losartan held  -  Holding nadolol prn for hypotension   -have decreased hydralazine to '10mg'$    6/15 sbp with some increase. Continue to observe  9: Hyperlipidemia: continue atorvastatin 10: GI prophylaxis: continue pantoprazole 11: pAF: continue diltiazem. No AC until resolution of ICH, outpt follow-up  -HR has been controlled  6/15-pt with persistent sob, work up has been fairly unremarkable except for pO2 on ABG which was slightly low. Suspect sx related to anxiety, fatigue, age, soft bp's.  Will ask cardiology if they have anything to recommend  in this regard.  12: Hypothyroidism: continue Synthroid 13: Urinary retention: continue Flomax             -now voiding without caths   -wean urecholine to off  14: UTI:  Keflex completed  -6/13- repeat ucx w/ Citrobacter >100k- fosfomycin x 1 on 6/11  15: AKI: BUN and Cr  have improved             -pt's intake has improved  16: Leukocytosis: wbc's normal.   17: Anemia, chronic: no evidence of acute blood loss -hgb stable at 8.6 18: Polymyalgia rheumatica: prednisone 2 mg with lunch 19. Dysphagia:               -advanced to reg/thins per SLP 20. macular degeneration of the right eye, uses preservision at home. - improved  Continue use of artifical tears and Prosight.    LOS: 23 days A FACE TO FACE EVALUATION WAS PERFORMED  Meredith Staggers 04/25/2022, 9:05 AM

## 2022-04-25 NOTE — Progress Notes (Signed)
Patient ID: [name], female   DOB: 1923-08-15, 88 y.o.   MRN: [id number]  SW met with pt and pt children to discuss SNF placement. Preferred locations: [facility], [facility], [facility], [facility] (no bed), and [facility] (out of network). States they are willing to pay privately for [facility] if needed.   SW left message for Kelly/Admissions with [facility], Nikki/Admissions with [facility] to discuss referral and waiting. SW spoke with Christie/Admissions with [facility] to discuss referral in which they reported they would review.  SW spoke with GraceAnn/Admissions with [facility] ([facility]) to discuss referral. States gave family rates and encouraged to use insurance due to expense. States if family is interested, than they can follow-up to discuss further.   Loralee Pacas, MSW, [place] Office: [phone] Cell: [phone] Fax: [phone]

## 2022-04-25 NOTE — Consult Note (Addendum)
Cardiology Consultation:   Patient ID: ZERIYAH [name] MRN: [id number]; DOB: 08/06/1923  Admit date: 04/02/2022 Date of Consult: 04/25/2022  PCP:  [name], MD   [facility] Providers Cardiologist:  Virl Axe, MD   {    Patient Profile:   [name] is a 88 y.o. female with a hx of persistent A fib (stopped AC due to ICH),chronic diastolic heart failure, left thalamic intracerebral hemorrhage 03/25/22, HTN, hypothyroidism, HLD, CKD III, polymyalgia rheumatica on chronic prednisone, macular degeneration, chronic anemia, GERD, provoked DVT, who is being seen 04/25/2022 for the evaluation of SOB and A fib at the request of Dr Naaman Plummer.  History of Present Illness:   [name] with above PMH was recently discharged to CIR on 04/02/22 after a course of hospitalization for left thalamic intracerebral hemorrhage, complicated by uncontrolled HTN, AKI, UTI, and urinary retention. She originally presented with acute onset of right-sided weakness, aphasia and slurred speech on 03/25/22. ICH was felt due to uncontrolled HTN along with Eliquis use. Eliquis was stopped and reversed, neurology had recommended resume Eliquis once IPH is resolved. She was discharged on losartan, Cardizem, nadolol, cleviprex, and hydralazine for BP control. She remained in sinus rhythm during this hospitalization. Echo from 03/25/22 showed LVEF 55-60% with no RWMA, mild concentric LVH, elevated LVEDP, normal RV, mild LAE, aortic sclerosis, dilatation of the ascending aorta, measuring 39 mm.   Patient follows Dr Caryl Comes outpatient for A fib, has been see by Dr Doylene Canard in the past for hospital consults. She originally developed A fib RVR 12/2019 during hospitalization for perforated diverticulitis requiring Hartmann's procedure. Echo from 12/30/19 showed LVEF 50%, apical septal and apical anterior hypokinesis, grade I DD, normal RV, trivial MR, mild aortic sclerosis.  She was treated with amiodarone / metoprolol /  diltiazem and started on Eliquis at the time.   She was referred to A fib clinic 03/08/20, amiodarone dosing was reduced due to bradycardia. Later she established care with Dr Caryl Comes 04/11/20, amiodarone stopped as she remained in persistent A fib. Cardioversion was offered. She  was felt in HFpEF with volume overload and was started on lasix. She underwent cardioversion on 05/09/20 with successful conversion to NSR. Post procedure she suffered pneumonia requiring hospitalization. She suffered a fall with intramuscular hematoma causing interruption of Eliqis 7/21. She was last seen by Dr Caryl Comes 05/01/21, remained in sinus rhythm since her cardioversion, has been maintained on nadolol and cardizem, along with Eliquis. She had not required more lasix for CHF. She had a syncope event that was concerned for carotid sinus hypersensitivity, she was not inclined for further workup at the time.   Patient has been attending rehab at [facility] since her discharge on 04/02/22. She had low BP where losartan was held, hydralazine was reduced. She has been complaining SOB that seems worsened today. She had completed a course of Keflex for UTI. Cardiology is consulted today for further recommendation.   So far with recent available workup, ABG from 04/10/22 with borderline hypoxemia. Urine culture from 04/18/22 >100,000 citrobacter koseri. BMP from 04/22/22 with Cr 1.42, rising from 0.85-1.04 ranges (discharge date), GFR 34. CBC from 04/22/22 showed Hgb 8.6.   Upon encounter, patient is sitting in chair. She states she has been feeling SOB since her initial hospitalization on 03/25/22. She endorse SOB at rest and with exertion, orthopnea, is not able to lay flat comfortably. She noted her SOB is getting worse over the past week. She also has some mid-sternum chest pressure feeling with  these SOB episodes. She denied any chest pain, lightheadedness, syncope, leg edema. She is doing well with rehab. She recalls having some palpitation when she  had A fib back then, states this is different than her A fib symptoms.      Past Medical History:  Diagnosis Date   Closed fracture of left distal radius 12/29/2019   DVT (deep venous thrombosis) ([place]) 09/2015   RLE   Dysrhythmia    Afib   GERD (gastroesophageal reflux disease)    Hip fracture ([place]) 12/02/2020   Hypertension    Hypothyroidism    Melanoma ([place]) 06/16/2017   Facial melanoma, removed by Dr. Harvel Quale   Peripheral vascular disease [facility])    peripheral neuropathy    Past Surgical History:  Procedure Laterality Date   ABDOMINAL HYSTERECTOMY     APPENDECTOMY     BACK SURGERY     BREAST SURGERY     left breast biopsy   CARDIOVERSION N/A 05/09/2020   Procedure: CARDIOVERSION;  Surgeon: Skeet Latch, MD;  Location: [facility] END[facility]COPY;  Service: Cardiovascular;  Laterality: N/A;   COLOSTOMY N/A 01/06/2020   Procedure: Colostomy;  Surgeon: Greer Pickerel, MD;  Location: [place];  Service: General;  Laterality: N/A;   EYE SURGERY     cataract   HARDWARE REMOVAL Left 01/01/2020   Procedure: HARDWARE REMOVAL;  Surgeon: Iran Planas, MD;  Location: [place];  Service: Orthopedics;  Laterality: Left;   HIP ARTHROPLASTY Left 12/03/2020   Procedure: ARTHROPLASTY BIPOLAR HIP (HEMIARTHROPLASTY);  Surgeon: Renette Butters, MD;  Location: [place];  Service: Orthopedics;  Laterality: Left;   HIP ARTHROPLASTY Left    partial   IR KYPHO EA ADDL LEVEL THORACIC OR LUMBAR  06/27/2021   IR RADIOLOGIST EVAL & MGMT  07/13/2021   LAPAROTOMY N/A 01/06/2020   Procedure: Exploratory Laparotomy, Open Sigmoid colectomy;  Surgeon: Greer Pickerel, MD;  Location: [place];  Service: General;  Laterality: N/A;   LIPOMA EXCISION Left 04/25/2015   Procedure: EXCISION OF LIPOMA LEFT ARM;  Surgeon: Donnie Mesa, MD;  Location: [place];  Service: General;  Laterality: Left;   LUMBAR LAMINECTOMY/DECOMPRESSION MICRODISCECTOMY  11/20/2011   Procedure: LUMBAR LAMINECTOMY/DECOMPRESSION MICRODISCECTOMY;  Surgeon:  Laurice Record [place];  Location: [facility];  Service: Orthopedics;  Laterality: N/A;  Decompressive Laminectomy L2 to the Sacrum (X-Ray)   LYSIS OF ADHESION N/A 01/17/2021   Procedure: RIGID PROCTOSCOPY;  Surgeon: Michael Boston, MD;  Location: [facility];  Service: General;  Laterality: N/A;   OPEN REDUCTION INTERNAL FIXATION (ORIF) DISTAL RADIAL FRACTURE Left 01/01/2020   Procedure: OPEN REDUCTION INTERNAL FIXATION (ORIF) DISTAL RADIAL FRACTURE;  Surgeon: Iran Planas, MD;  Location: [place];  Service: Orthopedics;  Laterality: Left;   OTHER SURGICAL HISTORY     left wrist surgery - has plate in left wrist    OTHER SURGICAL HISTORY     right knee surgery due to torn cartilage    TONSILLECTOMY     WRIST FRACTURE SURGERY Left    XI ROBOTIC ASSISTED COLOSTOMY TAKEDOWN N/A 01/17/2021   Procedure: XI ROBOTIC ASSISTED OSTOMY TAKEDOWN, LYSIS OF ADHESIONS, RECTOSIGMOID RESECTION, BILATERAL TAP BLOCK;  Surgeon: Michael Boston, MD;  Location: [facility];  Service: General;  Laterality: N/A;     Home Medications:  Prior to Admission medications   Medication Sig Start Date End Date Taking? Authorizing Provider  apixaban (ELIQUIS) 5 MG TABS tablet Take 5 mg by mouth 2 (two) times daily.   Yes [provider]  losartan (COZAAR) 100  MG tablet Take 100 mg by mouth daily.   Yes [provider]  acetaminophen (TYLENOL) 500 MG tablet Take 500 mg by mouth at bedtime.    [provider]  ALPRAZolam Duanne Moron) 0.25 MG tablet Take 1 tablet (0.25 mg total) by mouth at bedtime as needed for anxiety. 04/02/22   Shelly Coss, MD  atorvastatin (LIPITOR) 20 MG tablet Take 1 tablet (20 mg total) by mouth daily. 04/03/22   Shelly Coss, MD  diltiazem (CARDIZEM CD) 120 MG 24 hr capsule Take 1 capsule (120 mg total) by mouth every evening. 04/03/22   [name], MD  docusate sodium (COLACE) 100 MG capsule Take 1 capsule (100 mg total) by mouth 2 (two) times daily. 01/20/20   Love, Ivan Anchors, PA-C  gabapentin  (NEURONTIN) 300 MG capsule TAKE 1 CAPSULE BY MOUTH EVERYDAY AT BEDTIME Patient taking differently: Take 300 mg by mouth at bedtime. 11/26/21   [name], MD  hydrALAZINE (APRESOLINE) 50 MG tablet Take 1 tablet (50 mg total) by mouth every 8 (eight) hours. 04/02/22   Shelly Coss, MD  levothyroxine (SYNTHROID) 50 MCG tablet TAKE 1 TABLET BY MOUTH EVERY DAY Patient taking differently: Take 50 mcg by mouth daily before breakfast. 11/26/21   [name], MD  Multiple Vitamins-Minerals (PRESERVISION AREDS 2 PO) Take 1 tablet by mouth daily with lunch.    [provider]  nadolol (CORGARD) 80 MG tablet TAKE 1 TABLET BY MOUTH EVERY DAY 04/15/22   [name], MD  pantoprazole (PROTONIX) 40 MG tablet TAKE 1 TABLET BY MOUTH EVERY DAY 04/15/22   [name], MD  predniSONE (DELTASONE) 1 MG tablet Take 2 tablets (2 mg total) by mouth daily with lunch. 04/03/22   Shelly Coss, MD  senna-docusate (SENOKOT-S) 8.6-50 MG tablet Take 1 tablet by mouth 2 (two) times daily. 04/02/22   Shelly Coss, MD  tamsulosin (FLOMAX) 0.4 MG CAPS capsule Take 1 capsule (0.4 mg total) by mouth daily. 04/03/22   Shelly Coss, MD  Vitamin D, Ergocalciferol, (DRISDOL) 1.25 MG (50000 UNIT) CAPS capsule Take 1 capsule (50,000 Units total) by mouth every 7 (seven) days. Patient taking differently: Take 50,000 Units by mouth every 7 (seven) days. No set day 10/25/21   [name], MD    Inpatient Medications: Scheduled Meds:  acetaminophen  650 mg Oral Daily   atorvastatin  20 mg Oral Daily   bethanechol  10 mg Oral TID   diclofenac Sodium  2 g Topical TID   diltiazem  120 mg Oral QPM   feeding supplement  1 Container Oral TID BM   gabapentin  200 mg Oral QHS   heparin injection (subcutaneous)  5,000 Units Subcutaneous Q8H   hydrALAZINE  10 mg Oral Q8H   levothyroxine  50 mcg Oral Daily   multivitamin  1 tablet Oral Daily   nadolol  80 mg Oral Daily   pantoprazole  40 mg  Oral Daily   predniSONE  2 mg Oral Q lunch   tamsulosin  0.4 mg Oral Daily   Continuous Infusions:  PRN Meds: acetaminophen, alum & mag hydroxide-simeth, bisacodyl, guaiFENesin-dextromethorphan, nitroGLYCERIN, polyvinyl alcohol, prochlorperazine **OR** prochlorperazine **OR** prochlorperazine, senna-docusate, sodium phosphate  Allergies:    Allergies  Allergen Reactions   Keflex [Cephalexin] Nausea Only and Other (See Comments)    Pt ended up in ER w/ CHEST PAIN.  Patient experienced chest pain again with re-challenge of cephalexin in May 2023.   Codeine Other (See Comments)    "  Nightmares, imagined things"    Social History:   Social History   Socioeconomic History   Marital status: Widowed    Spouse name: Not on file   Number of children: Not on file   Years of education: Not on file   Highest education level: Not on file  Occupational History   Occupation: retired  Tobacco Use   Smoking status: Never   Smokeless tobacco: Never  Vaping Use   Vaping Use: Never used  Substance and Sexual Activity   Alcohol use: No   Drug use: No   Sexual activity: Never  Other Topics Concern   Not on file  Social History Narrative   Lots of family nearby   She has an aide 4 days per week   Social Determinants of Health   Financial Resource Strain: Low Risk  (12/27/2021)   Overall Financial Resource Strain (CARDIA)    Difficulty of Paying Living Expenses: Not hard at all  Food Insecurity: No Food Insecurity (12/27/2021)   Hunger Vital Sign    Worried About Running Out of Food in the Last Year: Never true    [place] in the Last Year: Never true  Transportation Needs: No Transportation Needs (12/27/2021)   PRAPARE - Hydrologist (Medical): No    Lack of Transportation (Non-Medical): No  Physical Activity: Insufficiently Active (12/27/2021)   Exercise Vital Sign    Days of Exercise per Week: 7 days    Minutes of Exercise per Session: 20 min   Stress: No Stress Concern Present (12/27/2021)   [place]    Feeling of Stress : Not at all  Social Connections: Moderately Integrated (12/27/2021)   Social Connection and Isolation Panel [NHANES]    Frequency of Communication with Friends and Family: More than three times a week    Frequency of Social Gatherings with Friends and Family: Once a week    Attends Religious Services: 1 to 4 times per year    Active Member of [employer] or Organizations: Yes    Attends Archivist Meetings: 1 to 4 times per year    Marital Status: Widowed  Intimate Partner Violence: Not At Risk (12/27/2021)   Humiliation, Afraid, Rape, and Kick questionnaire    Fear of Current or Ex-Partner: No    Emotionally Abused: No    Physically Abused: No    Sexually Abused: No    Family History:    Family History  Problem Relation Age of Onset   Cancer Mother        BREAST   COPD Father    Emphysema Father    Cancer Daughter        breast     ROS:  Constitutional: Denied fever, chills, malaise, night sweats Eyes: Denied vision change or loss Ears/Nose/Mouth/Throat: Denied ear ache, sore throat, coughing, sinus pain Cardiovascular: see HPI  Respiratory: see HPI  Gastrointestinal: Denied nausea, vomiting, abdominal pain, diarrhea Genital/Urinary: Denied dysuria, hematuria, urinary frequency/urgency Musculoskeletal: overall weakness Skin: Denied rash, wound Neuro: Denied headache, dizziness, syncope Psych: Denied history of depression/anxiety  Endocrine: Denied history of diabetes   Physical Exam/Data:   Vitals:   04/24/22 1438 04/24/22 1936 04/25/22 0240 04/25/22 1305  BP: 117/61 (!) 135/92 (!) 155/61 (!) 107/59  Pulse: 79 74 63 60  Resp: '18 17 18 20  '$ Temp: 98.1 F (36.7 C) 98.2 F (36.8 C) 98.1 F (36.7 C) 98 F (36.7 C)  TempSrc:  Oral  Oral  SpO2: 98% 98% 95% 97%  Weight:      Height:        Intake/Output Summary  (Last 24 hours) at 04/25/2022 1426 Last data filed at 04/25/2022 0820 Gross per 24 hour  Intake 696 ml  Output 200 ml  Net 496 ml      04/02/2022    5:39 PM 03/25/2022    2:00 AM 12/27/2021   12:47 PM  Last 3 Weights  Weight (lbs) 154 lb 15.7 oz 145 lb 8.1 oz 144 lb  Weight (kg) 70.3 kg 66 kg 65.318 kg     Body mass index is 25.01 kg/m.   Vitals:  Vitals:   04/25/22 0240 04/25/22 1305  BP: (!) 155/61 (!) 107/59  Pulse: 63 60  Resp: 18 20  Temp: 98.1 F (36.7 C) 98 F (36.7 C)  SpO2: 95% 97%   General Appearance: In no apparent distress, sitting in bed HEENT: Normocephalic, atraumatic.  Neck: Supple, trachea midline, no JVDs Cardiovascular: Irregularly irregular , normal S1-S2,  no murmur Respiratory: Resting breathing unlabored, lungs sounds clear to auscultation bilaterally, no use of accessory muscles. On room air.  No wheezes, rales or rhonchi.   Gastrointestinal: Bowel sounds positive, abdomen soft  Extremities: Able to move all extremities in bed without difficulty, no edema Musculoskeletal: Normal muscle bulk and tone Skin: Intact, warm, dry. No rashes or petechiae noted in exposed areas.  Neurologic: Alert, oriented to person, place and time. No cognitive deficit Psychiatric: Normal affect. Mood is appropriate.    EKG:  The EKG was personally reviewed and demonstrates: EKG ordered now showed A fib with VR 67 bpm  Telemetry:  Telemetry was personally reviewed and demonstrates:  N/A  Relevant CV Studies:  Echo from 03/25/22:   1. Left ventricular ejection fraction, by estimation, is 55 to 60%. The  left ventricle has normal function. The left ventricle has no regional  wall motion abnormalities. There is mild concentric left ventricular  hypertrophy. Left ventricular diastolic  parameters are indeterminate. Elevated left ventricular end-diastolic  pressure.   2. Right ventricular systolic function is normal. The right ventricular  size is normal.   3. Left  atrial size was mildly dilated.   4. The mitral valve is normal in structure. No evidence of mitral valve  regurgitation. No evidence of mitral stenosis.   5. The aortic valve is normal in structure. Aortic valve regurgitation is  trivial. Aortic valve sclerosis is present, with no evidence of aortic  valve stenosis. Aortic regurgitation PHT measures 519 msec.   6. There is dilatation of the ascending aorta, measuring 39 mm.   7. The inferior vena cava is normal in size with greater than 50%  respiratory variability, suggesting right atrial pressure of 3 mmHg.   Laboratory Data:  High Sensitivity Troponin:   Recent Labs  Lab 04/02/22 1154 04/02/22 1351 04/02/22 1902  TROPONINIHS '13 11 9     '$ Chemistry Recent Labs  Lab 04/21/22 0900 04/22/22 0513  NA 138 141  K 4.5 4.4  CL 105 109  CO2 24 25  GLUCOSE 128* 103*  BUN 23 22  CREATININE 1.44* 1.42*  CALCIUM 9.2 9.1  GFRNONAA 33* 34*  ANIONGAP 9 7    No results for input(s): "PROT", "ALBUMIN", "AST", "ALT", "ALKPHOS", "BILITOT" in the last 168 hours. Lipids No results for input(s): "CHOL", "TRIG", "HDL", "LABVLDL", "LDLCALC", "CHOLHDL" in the last 168 hours.  Hematology Recent Labs  Lab 04/22/22 0513  WBC  6.2  RBC 2.95*  HGB 8.6*  HCT 27.6*  MCV 93.6  MCH 29.2  MCHC 31.2  RDW 15.6*  PLT 242   Thyroid No results for input(s): "TSH", "FREET4" in the last 168 hours.  BNPNo results for input(s): "BNP", "PROBNP" in the last 168 hours.  DDimer No results for input(s): "DDIMER" in the last 168 hours.   Radiology/Studies:  No results found.   Assessment and Plan:   Dyspnea  Chronic diastolic heart failure with suspicion of acute decompensation  - hx of volume overload but has not required diuretics at home for a few years  - now with 1 month onset of worsening SOB with exertion and at rest, chest pressure, orthopnea  - Echo from 03/25/22 with EF preserved  - will check CBC, CMP, Mag, BNP - will check CXR -  monitor daily weight and intake and output please  - EKG with recurrence of A fib, rate controlled today - suspect etiology of acute HFpEF versus recurrence of A fib, further recommendation pending workup   Persistent atrial fibrillation  - EKG with recurrence of A fib, rate controlled today - has remained in SR since cardioversion in 04/2020 until now - continue diltiazem and nadolol for rate control, historically amiodarone did not work for rhythm conversion, pending CHF workup above, would not repeat DCCV at this time as she can't take anticoagulant Eliquis currently due to recent ICH  HTN - BP 107/59 - 155/61 over the past 24-48 hours - on diltiazem CD '120mg'$ , nadolol '80mg'$  daily, flomax 0.'4mg'$  (for UR), and hydralazine '10mg'$  TID - may stop hydralazine to avoid hypotension   Functional deficit  from left thalamic intracerebral hemorrhage 03/25/22  Anemia HLD Polymyalgia rheumatica - per primary team       Risk Assessment/Risk Scores:   New York Heart Association (NYHA) Functional Class NYHA Class III  CHA2DS2-VASc Score = 7   This indicates a 11.2% annual risk of stroke. The patient's score is based upon: CHF History: 1 HTN History: 1 Diabetes History: 0 Stroke History: 2 Vascular Disease History: 0 Age Score: 2 Gender Score: 1  For questions or updates, please contact [place] Please consult [email or web address] for contact info under    Signed, Margie Billet, NP  04/25/2022 2:26 PM  Personally seen and examined. Agree with APP above with the following comments:  Briefly 88 yo F with a history of PAF s/p 2021 DCCV, on AC Diastolic HF and hypotension, CKD stage IIIa who has not been on diuretics recently because of no need, who presents with shortness of breath.  While in rehab  Patient notes that even her May fall for ICH, she had felt shortness of breath.  This has progress during her prolonged stay, fall, and rehab to shortness of breath at rest (on admission had SOB  with exertion).  She doesn't remember the symptoms she had when she was in atrial fibrillation.  Through her rehab stay she had has labile BP from SBP 100s to 150s over today. As part of our evaluation, we got an echocardiogram showing return of atrial fibrillation  She denies CP or chest pressure to me; but has noted chest pressure to other providers.  She has resting SOB in her wheelchair.    Exam notable for irregular rhythm with systolic murmur; in compression stocking during exam, CTAB.  Otherwise as above. Tele: NA  Would recommend  As above; we will potentially start low dose lasix based on these findings (BNP drawn during exam)  and stop hydralazine given her syncope (potentially carotid mediated) - she may feel more short of breath because of being in atrial fibrillation; she is presently rate controlled.  We have limited options because of her in ability to be on anticoagulation.  When she is cleared for anticoagulation, we could consider rhythm control strategy again. Discussed with patient and family  Rudean Haskell, MD Cardiologist [facility] [address] [place], [place] [address] [phone]  3:35 PM

## 2022-04-25 NOTE — Progress Notes (Signed)
Occupational Therapy Session Note  Patient Details  Name: [name] MRN: [id number] Date of Birth: [birth year]  Today's Date: 04/25/2022 OT Individual Time: 0424-7319 OT Individual Time Calculation (min): 30 min    Short Term Goals: Week 4:  OT Short Term Goal 1 (Week 4): LTG=STG 2/2 ELOS  Skilled Therapeutic Interventions/Progress Updates:    Pt greeted seated in wc and agreeable to OT treatment session. Pt reported feeling nervous for family education this afternoon. OT provided emotional support and encouragement as well as detailed out what family education session would entail. Pt reports feeling less anxious. UB dressing completed with focus on functional use of R UE throughout all BADLs. Pt able to open deodorant lid with focus on isolating rotation on R hand to open lid. Standing balance and R fine motor control with standing toothbrushing task with min A. Upon standing to change pajama pants, noted blood on underwear. Pt with likely Hemorids and nursing notified. Continued working on standing while OT assisted with washing buttocks. Pt needed max A for LB dressing due to time management, but completed all sit<>stands at the sink with min A! Pt pivoted to recliner with Mod A to the L and left seated in recliner with alarm belt on, call bell in reach and needs met.  Therapy Documentation Precautions:  Precautions Precautions: Fall Precaution Comments: R hemi Restrictions Weight Bearing Restrictions: No Pain: Pain Assessment Pain Scale: 0-10 Pain Score: 0-No pain   Therapy/Group: Individual Therapy  Valma Cava 04/25/2022, 10:12 AM

## 2022-04-25 NOTE — Progress Notes (Signed)
Speech Language Pathology Daily Session Note  Patient Details  Name: [name] MRN: [id number] Date of Birth: 08/21/1923  Today's Date: 04/25/2022 SLP Individual Time: 1300-1345 SLP Individual Time Calculation (min): 45 min  Short Term Goals: Week 4: SLP Short Term Goal 1 (Week 4): STGs=LTGs due to ELOS  Skilled Therapeutic Interventions: Skilled treatment session focused on completion of family education with the patient and her children. SLP facilitated session by providing education regarding current swallowing function, diet recommendations, appropriate textures and compensatory strategies. SLP also facilitated session by providing education regarding patient's current cognitive-linguistic functioning and strategies to utilize at home to maximize word-finding, recall and overall safety. Handouts were also given for diaphragmatic breathing to maximize breath support for speech intelligibility. Patient and family verbalized understanding of all information and handouts were given for reinforcement. Patient left upright in recliner with alarm on and all needs within reach. Continue with current plan of care.      Pain No/Denies Pain   Therapy/Group: Individual Therapy  [place], [place] 04/25/2022, 3:59 PM

## 2022-04-25 NOTE — Progress Notes (Addendum)
I have read and reviewed the attached note and am in agreement with the documentation provided.   This licensed clinician was present and actively directing care throughout the session at all times.  Cherylynn Ridges OTR/L   Occupational Therapy Session Note  Patient Details  Name: [name] MRN: [id number] Date of Birth: 1923/01/01  Today's Date: 04/25/2022 OT Individual Time: 1416-1500 OT Individual Time Calculation (min): 44 min    Short Term Goals: Week 4:  OT Short Term Goal 1 (Week 4): LTG=STG 2/2 ELOS  Skilled Therapeutic Interventions/Progress Updates:    Pt received sitting on the toilet with nurse tech present. There was a direct hand of to OT for family education. Pt with successful void and BM. Worked on toileting hygiene with pt able to use R hand to clean posterior peri area. 3 children present for family education which focused on pts safety completing transfers from w/c <> sink and w/c <> toilet with moderate assistance. Extensive discussion with family on benefits of staying in [place] v. SNF v. Home. Family discussing concerns about going home related mainly to transferring. MD and phlebotomist came in during session to draw blood and discuss SOB pt has been experiencing. Noted more SOB during therapy session today. Pt left seated in w/c with family present in the room.   Therapy Documentation Precautions:  Precautions Precautions: Fall Precaution Comments: R hemi Restrictions Weight Bearing Restrictions: No  Therapy/Group: Individual Therapy  Catalina Lunger 04/25/2022, 2:46 PM

## 2022-04-25 NOTE — Progress Notes (Signed)
Physical Therapy Session Note  Patient Details  Name: [name] MRN: [id number] Date of Birth: 1923/05/14  Today's Date: 04/25/2022 PT Individual Time: 0902-0929 and 3557-3220 PT Individual Time Calculation (min): 27 min and 55 min  Short Term Goals: Week 3:  PT Short Term Goal 1 (Week 3): STGs = LTGs  Skilled Therapeutic Interventions/Progress Updates:     1st Session: Pt received supine in bed and agree to therapy. No complaint of pain. Pt performs supine to sit with minA and cues for positioning. PT assists with donning ted hose and L AFO and shoes while seated at EOB. Pt performs stand pivot transfer to [facility] with modA and cues for initiation, hand placement, sequencing, and positioning. WC transport to gym for time management. Sit to stand to EVA walker with minA/modA and cues for hand placement and body mechanics. Pt ambulates x45' with modA initially, progressing to maxA due to heavy R sided lean and difficulty shifting weight laterally to the L to be able to progress R foot, despite heavy multimodal cueing. Pt requires +2 to bring WC for safety. WC transport back to room. Left with alarm intact and all needs within reach.  2nd Session: Pt received seated in [facility] and agrees to therapy. No complaint of pain. Family present for family education. WC transport to gym for time management. Pt completes stand pivot transfer to the R with modA and cues for hand placement and sequencing. Sit to supine with minA. Pt completes supine NMR for R lower extremity motor control and bilateral coordination. Pt completes 4x10 bridges with various cues to facilitate neutral hip rotation on the R due to pt's tendency to drift R and externally rotate R hip. PT provides tactile cueing at medial thigh and has pt maintain pressure on hand to promote neutral position, but pt has difficulty maintaining and consistently externally rotates R hip after 3-4 bridges.   PT provides summary to family of pt's mobility status  and progress with therapy, including challenges and recommendations for 24/7 assistance at discharge. Pt performs sit to stand with modA to EVA walker, with cues for hand placement and body mechanics. Pt ambulates x80' with EVA walker and modA, with consistent cueing to keep L lower extremity close to midline and R lower extremity slightly abducted for stability. Pt still has R lateral bias with L lower extremity pushing pt's trunk to the R, and pt has difficulty correcting. Pt performs stand pivot transfer from mat to [facility] toward the L, requiring maxA due to L leg pushing pt back to the R. WC transport back to room. Pt performs stand pivot to the R with modA. Left seated with alarm intact and all needs within reach.  Therapy Documentation Precautions:  Precautions Precautions: Fall Precaution Comments: R hemi Restrictions Weight Bearing Restrictions: No    Therapy/Group: Individual Therapy  Breck Coons, PT, DPT 04/25/2022, 5:14 PM

## 2022-04-26 ENCOUNTER — Ambulatory Visit: Payer: Medicare PPO | Admitting: Family Medicine

## 2022-04-26 DIAGNOSIS — I5033 Acute on chronic diastolic (congestive) heart failure: Secondary | ICD-10-CM

## 2022-04-26 DIAGNOSIS — I4819 Other persistent atrial fibrillation: Secondary | ICD-10-CM

## 2022-04-26 MED ORDER — BETHANECHOL CHLORIDE 10 MG PO TABS
10.0000 mg | ORAL_TABLET | Freq: Two times a day (BID) | ORAL | Status: AC
Start: 2022-04-26 — End: 2022-04-28
  Administered 2022-04-26 – 2022-04-28 (×5): 10 mg via ORAL
  Filled 2022-04-26 (×5): qty 1

## 2022-04-26 MED ORDER — FUROSEMIDE 10 MG/ML IJ SOLN
40.0000 mg | Freq: Once | INTRAMUSCULAR | Status: AC
  Administered 2022-04-26: 40 mg via INTRAVENOUS
  Filled 2022-04-26: qty 4

## 2022-04-26 NOTE — Progress Notes (Addendum)
I have read and reviewed the attached note and am in agreement with the documentation provided.   This licensed clinician was present and actively directing care throughout the session at all times.  Cherylynn Ridges OTR/L   Occupational Therapy Session Note  Patient Details  Name: [name] MRN: [id number] Date of Birth: February 19, 1923  Today's Date: 04/26/2022 OT Individual Time: 0630-1601 OT Individual Time Calculation (min): 56 min    Short Term Goals: Week 4:  OT Short Term Goal 1 (Week 4): LTG=STG 2/2 ELOS  Skilled Therapeutic Interventions/Progress Updates:    Pt received supine in bed with nurse present in the room. Pt completed bed mobility supine > EOB with Min A and maintained good balance while seated. Pt attempted standing with walker but significant LOB and inability to align feet while standing. Completed stand pivot to w/c with Mod A. Transported to bathroom to shower. Stood with Min A utilizing grab bars in shower for stability. Pt able to wash most of her body without assistance. Required assistance for back area and thoroughness.  Pt dressed seated in w/c with Max A d/t time management. Completed oral care and able to manipulate deoderant and toothpaste utilizing BUE. Assistance required for accuracy under LUE. Pt dropped deoderant and wash clothes 4x and displayed inattention to RUE during functional tasks like pulling shirt down. Stood at the sink for 5 min while therapist blow dried hair and demonstrated good static standing balance with BUE WB on the counter. No lean or LOB noted. Pt left seated in w/c with nurse called d/t need to urinate.   Therapy Documentation Precautions:  Precautions Precautions: Fall Precaution Comments: R hemi Restrictions Weight Bearing Restrictions: No     Therapy/Group: Individual Therapy  Catalina Lunger 04/26/2022, 9:43 AM

## 2022-04-26 NOTE — Progress Notes (Signed)
Progress Note  Patient Name: [name] Date of Encounter: 04/26/2022  Primary Cardiologist: Virl Axe, MD   Subjective   Overnight received IV and lasix. BNP elevated CXR improved Patient notes that she feels better  No CP, SOB, Palpitations.  Inpatient Medications    Scheduled Meds:  acetaminophen  650 mg Oral Daily   atorvastatin  20 mg Oral Daily   bethanechol  10 mg Oral BID   diclofenac Sodium  2 g Topical TID   diltiazem  120 mg Oral QPM   feeding supplement  1 Container Oral TID BM   gabapentin  200 mg Oral QHS   heparin injection (subcutaneous)  5,000 Units Subcutaneous Q8H   hydrALAZINE  10 mg Oral Q8H   levothyroxine  50 mcg Oral Daily   multivitamin  1 tablet Oral Daily   nadolol  80 mg Oral Daily   pantoprazole  40 mg Oral Daily   predniSONE  2 mg Oral Q lunch   tamsulosin  0.4 mg Oral Daily   Continuous Infusions:  PRN Meds: acetaminophen, alum & mag hydroxide-simeth, bisacodyl, guaiFENesin-dextromethorphan, nitroGLYCERIN, polyvinyl alcohol, prochlorperazine **OR** prochlorperazine **OR** prochlorperazine, senna-docusate, sodium phosphate   Vital Signs    Vitals:   04/25/22 1305 04/25/22 1918 04/26/22 0445 04/26/22 0933  BP: (!) 107/59 (!) 143/82 136/61   Pulse: 60 69 66   Resp: '20 17 16   '$ Temp: 98 F (36.7 C) 97.7 F (36.5 C) 98 F (36.7 C)   TempSrc: Oral     SpO2: 97% 96% 95%   Weight:    67.9 kg  Height:        Intake/Output Summary (Last 24 hours) at 04/26/2022 1049 Last data filed at 04/26/2022 1039 Gross per 24 hour  Intake 354 ml  Output 800 ml  Net -446 ml   Filed Weights   04/02/22 1739 04/26/22 0933  Weight: 70.3 kg 67.9 kg    Telemetry    NA   Physical Exam   Gen: no distress, elderly female   Neck: No JVD Cardiac: No Rubs or Gallops, systolic Murmur, IRIR +2 radial pulses Respiratory: Clear to auscultation bilaterally, normal effort, normal  respiratory rate GI: Soft, nontender, non-distended   Integument: Skin feels warm Neuro:  At time of evaluation, alert and oriented to person/place/time/situation  Psych: Normal affect, patient feels better   Labs    Chemistry Recent Labs  Lab 04/21/22 0900 04/22/22 0513 04/25/22 1446  NA 138 141 140  K 4.5 4.4 4.5  CL 105 109 107  CO2 '24 25 24  '$ GLUCOSE 128* 103* 191*  BUN '23 22 20  '$ CREATININE 1.44* 1.42* 1.47*  CALCIUM 9.2 9.1 9.6  PROT  --   --  5.9*  ALBUMIN  --   --  3.4*  AST  --   --  15  ALT  --   --  17  ALKPHOS  --   --  71  BILITOT  --   --  0.5  GFRNONAA 33* 34* 32*  ANIONGAP '9 7 9     '$ Hematology Recent Labs  Lab 04/22/22 0513 04/25/22 1446  WBC 6.2 7.7  RBC 2.95* 3.43*  HGB 8.6* 9.9*  HCT 27.6* 31.3*  MCV 93.6 91.3  MCH 29.2 28.9  MCHC 31.2 31.6  RDW 15.6* 15.9*  PLT 242 266    Cardiac EnzymesNo results for input(s): "TROPONINI" in the last 168 hours. No results for input(s): "TROPIPOC" in the last 168 hours.  BNP Recent Labs  Lab 04/25/22 1446  BNP 416.0*     DDimer No results for input(s): "DDIMER" in the last 168 hours.   Radiology    DG Chest 2 View  Result Date: 04/25/2022 CLINICAL DATA:  Shortness of breath. EXAM: CHEST - 2 VIEW COMPARISON:  Chest x-ray 11/23/2020, CT abdomen and pelvis 07/17/2021. FINDINGS: The heart is enlarged, unchanged. There is no focal lung infiltrate, pleural effusion or pneumothorax. There are severe degenerative changes of the left shoulder. Vertebroplasty changes are seen in the lower thoracic region. There is some mild compression deformities of the lower thoracic region, unchanged from 07/17/2021. IMPRESSION: 1. Cardiomegaly. 2. No other acute cardiopulmonary process. Electronically Signed   By: Ronney Asters M.D.   On: 04/25/2022 19:33    Patient Profile     88 y.o. female history of HFpEF and AF with new SOB whilc in recovery from [place]    Dyspnea  Acute on chronic diastolic HF HTN CKD stage IIIb - BNP elevated stable CKD, gave  40 IV lasix today and based on response will give 40 IV lasix 04/27/22 - BMP in AM - if feels better will transition to lasix 40 mg PO daily - has had som lower AM Bps; will hold hydralazine 10 while on IV diuretics  Persistent atrial fibrillation - No AC in the setting of recent ICH - will continue present BB and CCB    For questions or updates, please contact [facility] Please consult [email or web address] for contact info under Cardiology/STEMI.      Rudean Haskell, MD Cardiologist [facility] [address], [address] [place], [place] [address] [phone]  10:49 AM

## 2022-04-26 NOTE — Progress Notes (Addendum)
Patient ID: COREY CAULFIELD, female   DOB: Aug 12, 1923, 88 y.o.   MRN: [id number]  SW left message for Kelly/Admissions with [facility] and Christie/Admissions with [facility] to discuss referral iand waiting on follow-up.  Per Epic- [facility] (no bed)  and [facility] (unable to meet needs) declined; [facility] decline  *Bed offer extended by [facility] with [facility].  SW updated pt dtr Jana Half to provide updates and states she will speak with her brothers about this and follow-up with SW.   SW received phone call from pt children to discuss semi-private bed offer with [facility]. Family likely to go visit location this weekend. States they will speak with their mother to discuss if she will be agreeable to location.   Loralee Pacas, MSW, [place] Office: [phone] Cell: [phone] Fax: [phone]

## 2022-04-26 NOTE — Progress Notes (Signed)
PROGRESS NOTE   Subjective/Complaints:  Just finished up with OT this morning. Still having sob. No worse than it has been before but it's persistent. Asked about work up from cards yesterday.   ROS: Patient denies fever, rash, sore throat, blurred vision, dizziness, nausea, vomiting, diarrhea, cough,  chest pain, joint or back/neck pain, headache, or mood change.    Objective:   DG Chest 2 View  Result Date: 04/25/2022 CLINICAL DATA:  Shortness of breath. EXAM: CHEST - 2 VIEW COMPARISON:  Chest x-ray 11/23/2020, CT abdomen and pelvis 07/17/2021. FINDINGS: The heart is enlarged, unchanged. There is no focal lung infiltrate, pleural effusion or pneumothorax. There are severe degenerative changes of the left shoulder. Vertebroplasty changes are seen in the lower thoracic region. There is some mild compression deformities of the lower thoracic region, unchanged from 07/17/2021. IMPRESSION: 1. Cardiomegaly. 2. No other acute cardiopulmonary process. Electronically Signed   By: Ronney Asters M.D.   On: 04/25/2022 19:33    Recent Labs    04/25/22 1446  WBC 7.7  HGB 9.9*  HCT 31.3*  PLT 266       Recent Labs    04/25/22 1446  NA 140  K 4.5  CL 107  CO2 24  GLUCOSE 191*  BUN 20  CREATININE 1.47*  CALCIUM 9.6        Intake/Output Summary (Last 24 hours) at 04/26/2022 0924 Last data filed at 04/26/2022 0703 Gross per 24 hour  Intake 354 ml  Output 400 ml  Net -46 ml        Physical Exam: Vital Signs Blood pressure 136/61, pulse 66, temperature 98 F (36.7 C), resp. rate 16, height '5\' 6"'$  (1.676 m), weight 70.3 kg, SpO2 95 %.    Constitutional: No distress . Vital signs reviewed. HEENT: NCAT, EOMI, oral membranes moist Neck: supple Cardiovascular: IRR w/ murmur. No JVD.    Respiratory/Chest: CTA Bilaterally without wheezes or rales. Sometimes becomes dsypneic with conversation and activity  GI/Abdomen: BS +,  non-tender, non-distended Ext: no clubbing, cyanosis, or edema Psych: pleasant and cooperative  Skin: Clean and intact without signs of breakdown Neuro:  alert oriented to person,hospital, reason. Much more focused and appropriate.  Follows commands. Language improving.. Minimal right central 7.  RUE 3-4/5 at least. RLE 3 to 3+/5. Sensation intact for pain and LT Musculoskeletal: left shoulder less tender. No leg pain      Assessment/Plan: 1. Functional deficits which require 3+ hours per day of interdisciplinary therapy in a comprehensive inpatient rehab setting. Physiatrist is providing close team supervision and 24 hour management of active medical problems listed below. Physiatrist and rehab team continue to assess barriers to discharge/monitor patient progress toward functional and medical goals  Care Tool:  Bathing    Body parts bathed by patient: Right arm, Left arm, Chest, Abdomen, Front perineal area, Buttocks, Right upper leg, Left upper leg, Face   Body parts bathed by helper: Buttocks     Bathing assist Assist Level: Minimal Assistance - Patient > 75%     Upper Body Dressing/Undressing Upper body dressing   What is the patient wearing?: Pull over shirt    Upper body assist Assist Level:  Moderate Assistance - Patient 50 - 74%    Lower Body Dressing/Undressing Lower body dressing      What is the patient wearing?: Pants, Incontinence brief     Lower body assist Assist for lower body dressing: Maximal Assistance - Patient 25 - 49%     Toileting Toileting    Toileting assist Assist for toileting: Maximal Assistance - Patient 25 - 49%     Transfers Chair/bed transfer  Transfers assist     Chair/bed transfer assist level: Moderate Assistance - Patient 50 - 74% (stand pivot with RW)     Locomotion Ambulation   Ambulation assist   Ambulation activity did not occur: Safety/medical concerns  Assist level: Moderate Assistance - Patient 50 -  74% Assistive device: Walker-Eva Max distance: 45   Walk 10 feet activity   Assist  Walk 10 feet activity did not occur: Safety/medical concerns  Assist level: Moderate Assistance - Patient - 50 - 74% Assistive device: Walker-Eva   Walk 50 feet activity   Assist Walk 50 feet with 2 turns activity did not occur: Safety/medical concerns         Walk 150 feet activity   Assist Walk 150 feet activity did not occur: Safety/medical concerns         Walk 10 feet on uneven surface  activity   Assist Walk 10 feet on uneven surfaces activity did not occur: Safety/medical concerns         Wheelchair     Assist Is the patient using a wheelchair?: Yes Type of Wheelchair: Manual    Wheelchair assist level: Minimal Assistance - Patient > 75% Max wheelchair distance: 25    Wheelchair 50 feet with 2 turns activity    Assist        Assist Level: Maximal Assistance - Patient 25 - 49% (Per PT documentation of 20 feet)   Wheelchair 150 feet activity     Assist      Assist Level: Total Assistance - Patient < 25% (Per PT documentation of 20 feet)   Blood pressure 136/61, pulse 66, temperature 98 F (36.7 C), resp. rate 16, height '5\' 6"'$  (1.676 m), weight 70.3 kg, SpO2 95 %.  Medical Problem List and Plan: 1. Functional deficits secondary to left thalamic intracranial hemorrhage             -patient may shower             -ELOS/Goals: 14-16 days, supervision to min assist  - mental status improved   -minimizing neurosedating meds, maximize sleep  -Continue CIR therapies including PT, OT, and SLP   -family now considering SNF given her projected care needs. Family ed 2.  Antithrombotics: -DVT/anticoagulation:  Pharmaceutical: Heparin             -antiplatelet therapy: none 3. Pain: continue Tylenol as needed.    -xray of left shoulder demonstrates significant DJD, old humeral neck fx  -added voltaren gel to left shoulder with benefit  -rom to  tolerance, discussed with patient the importance maintaining rom  -6/13 reduced gabapentin back to '200mg'$    -tylenol scheduled at 2000  -6/16 overall her pain improved 4. Mood/sleep: team providing ego support             -antipsychotic agents: n/a  -family is providing  emotional support as well  -melatonin dc'ed d/t questionable side effects 5. Neuropsych: This patient is not capable of making decisions on her own behalf. 6. Skin/Wound Care: Routine skin care checks 7.  Fluids/Electrolytes/Nutrition:               --encouraging PO  - intake reasonable  -  Boost Breeze to improve protein 8. Hypertension: continue diltiazem, hydralazine, nadolol. Home losartan held  - Holding nadolol prn for hypotension   -have decreased hydralazine to '10mg'$    6/16 sbp with some increase. Continue to observe  9: Hyperlipidemia: continue atorvastatin 10: GI prophylaxis: continue pantoprazole 11: pAF: continue diltiazem. No AC until resolution of ICH, outpt follow-up  -HR has been controlled  6/16-appreciate cardiology assistance -w/u yesterday fairly unremarkable except BNP which is elevated -suspect that cardiology will recommend diuresis -need weight today Filed Weights   04/02/22 1739  Weight: 70.3 kg    12: Hypothyroidism: continue Synthroid 13: Urinary retention: continue Flomax             -now voiding without caths   -wean urecholine to off  14: UTI:  Keflex completed  -6/13- repeat ucx w/ Citrobacter >100k- fosfomycin x 1 on 6/11  15: Acute on chronic kidney disease: Cr still sl elevated from baseline at admit.   Has not been on any nephrotoxic meds  -intake reasonable  16: Leukocytosis: wbc's normal.   17: Anemia, chronic: no evidence of acute blood loss -hgb stable at 8.6 18: Polymyalgia rheumatica: prednisone 2 mg with lunch 19. Dysphagia:               -advanced to reg/thins per SLP 20. macular degeneration of the right eye, uses preservision at home. - improved  Continue use of  artifical tears and Prosight.    LOS: 24 days A FACE TO [place] 04/26/2022, 9:24 AM

## 2022-04-26 NOTE — Progress Notes (Signed)
Physical Therapy Session Note  Patient Details  Name: [name] MRN: [id number] Date of Birth: 10/23/1923  Today's Date: 04/26/2022 PT Individual Time: 0900-0930 PT Individual Time Calculation (min): 30 min   Short Term Goals: Week 1:  PT Short Term Goal 1 (Week 1): pt will roll side to side w/ CGA. PT Short Term Goal 1 - Progress (Week 1): Met PT Short Term Goal 2 (Week 1): Pt will transfer sup/sidelying to sit w/ min A PT Short Term Goal 2 - Progress (Week 1): Met PT Short Term Goal 3 (Week 1): Pt will transfer sit to stand w/ mod A PT Short Term Goal 3 - Progress (Week 1): Met PT Short Term Goal 4 (Week 1): Pt will perform SPT w/ mod A PT Short Term Goal 4 - Progress (Week 1): Met  Skilled Therapeutic Interventions/Progress Updates:    Pt received in recliner and agreeable to therapy. MD in/out for AM rounds. Session focused on transfer training with RW. Pt performed Sit to stand to RW throughout session with min A. Initial cueing for hand placement, but good carryover through rest of session. Pt performed Stand pivot transfer recliner<>w/c x 2 with seated rest break with RW. To L side, pt performed with min A and VC. When moving to R side, pt with heavy posterior lean requiring mod-max A. Pt unable to lift and abduct RLE simultaneously. Transitioned to standing marches, x ~12 with RW. Pt able to achieve full march, but demoes decr stance time BIL. Noted incidence of painful knee buckle x 1. Noted pt TTP at lateral joint line of R knee, consistent with meniscus/arthritic pain related to genu valgum. Pt performed ambulatory transfer to bed with min A until turning to sit, then requiring max A d/t heavy posterior lean. Pt was unable to shift weight forward,even with max A. Pt sat safely with max A. Pt scooted up bed with mod A and VC for technique before returning to supine with supervision and encouragement. Pt remained in bed  per RN request for IV placement and was left with all needs in  reach and alarm active.    Therapy Documentation Precautions:  Precautions Precautions: Fall Precaution Comments: R hemi Restrictions Weight Bearing Restrictions: No General:      Therapy/Group: Individual Therapy  Mickel Fuchs 04/26/2022, 12:14 PM

## 2022-04-26 NOTE — Progress Notes (Signed)
Speech Language Pathology Daily Session Note  Patient Details  Name: [name] MRN: [id number] Date of Birth: [birth year]  Today's Date: 04/26/2022 SLP Individual Time: 1505-1600 SLP Individual Time Calculation (min): 55 min  Short Term Goals: Week 4: SLP Short Term Goal 1 (Week 4): STGs=LTGs due to ELOS  Skilled Therapeutic Interventions: Pt seen for skilled ST with focus on cognitive goals, pt finishing PT session but requesting to utilize bathroom. PT recommends nursing assist with transfer. Pt observed to functionally follow directions and utilize safety precautions with Supervision A during transfer. SLP facilitating functional recall task by providing overall Supervision A verbal cues. Pt discussing recent timeline of medical events which pt son and DIL providing correct infomation ~25% of time. Pt participating in abstract communication tasks (abstract questions, hypotheticals, providing multiple definitions for single word, etc) to target breath support and attention requiring overall min A verbal cues for accuracy. Pt attempting to print and sign name with L hand, ~60% legible with encouragement to practice as desired. Pt left in recliner with family present for needs. Cont ST POC.  Pain Pain Assessment Pain Scale: 0-10 Pain Score: 0-No pain  Therapy/Group: Individual Therapy  Dewaine Conger 04/26/2022, 4:01 PM

## 2022-04-26 NOTE — Progress Notes (Signed)
Physical Therapy Session Note  Patient Details  Name: [name] MRN: [id number] Date of Birth: 1923/10/12  Today's Date: 04/26/2022 PT Individual Time: 1347-1500 PT Individual Time Calculation (min): 73 min   Short Term Goals: Week 3:  PT Short Term Goal 1 (Week 3): STGs = LTGs  Skilled Therapeutic Interventions/Progress Updates:     Pt received seated in WC and agrees to therapy. No complaint of pain. PT dons ted hose and shoes prior to mobility. WC transport to gym for time management. Pt performs squat pivot transfer to mat toward the L with max verbal cues to limit activation of L lower extremity to prevent pushing. PT provides heavy modA for transfer with pt's L foot propped anteriorly to limit use. Pt performs NMR for standing balance with emphasis on midline positioning and R hemibody NMR. Pt attempts to stand with L leg proppe on 5 inch step stool but is unable to complete full stand with this position. Pt then performs sit to stand with PT on L side and with mirror for visual feedback. PT cues pt to maintain L hip and shoulder contact with PT to prevent R drift and L pushing. Pt able to stand with minA for majority of time in static standing, with periodic verbal and tactile cues when pt begins to drift to the R. Following seated rest break, pt performs again, this time with her L arm wrapped around PT's waist to promote stability and to assist with bringing pt's trunk to midline.  Pt performs sit to supine with minA. Pt positioned in L sidelying to provide NM feedback through L side to prevent overactivation. Pt attempts to straight L leg repeatedly and requires manual facilitation of flexed-knee positioning, but eventually able to maintain without manual assistance. Pt completes 3x10 R hip abduction against gravity with L leg restricted to prevent movement. Pt completes 2x10 R knee flexion/extension to promote anti-synergistic movement pattern with R hemibody. Pt performs 3 x10 total R  leg flexion and extension with PT manually restricting movement of L leg.   Supine to sit with minA and cues for sequencing. Pt performs sit to stand to RW with modA and cues for hand placement and body mechanics. Pt attempts ambulation but is only able to complete ~2' with RW and maxA, demonstrating heavy lean to the R and inability to correct, despite max verbal and tactile cueing. +2 required to bring [facility] for safety.  Stand pivot back to recliner with modA and cues for body mechanics and sequencing. Left seated with alarm intact and all needs within reach.  Therapy Documentation Precautions:  Precautions Precautions: Fall Precaution Comments: R hemi Restrictions Weight Bearing Restrictions: No    Therapy/Group: Individual Therapy  Breck Coons, PT, DPT 04/26/2022, 5:49 PM

## 2022-04-27 LAB — BASIC METABOLIC PANEL
Anion gap: 8 (ref 5–15)
BUN: 23 mg/dL (ref 8–23)
CO2: 26 mmol/L (ref 22–32)
Calcium: 9 mg/dL (ref 8.9–10.3)
Chloride: 104 mmol/L (ref 98–111)
Creatinine, Ser: 1.43 mg/dL — ABNORMAL HIGH (ref 0.44–1.00)
GFR, Estimated: 33 mL/min — ABNORMAL LOW (ref >60)
Glucose, Bld: 93 mg/dL (ref 70–99)
Potassium: 3.8 mmol/L (ref 3.5–5.1)
Sodium: 138 mmol/L (ref 135–145)

## 2022-04-27 MED ORDER — FUROSEMIDE 10 MG/ML IJ SOLN
40.0000 mg | Freq: Every day | INTRAMUSCULAR | Status: DC
  Administered 2022-04-27 – 2022-04-28 (×2): 40 mg via INTRAVENOUS
  Filled 2022-04-27 (×2): qty 4

## 2022-04-27 MED ORDER — ORAL CARE MOUTH RINSE: 15.0000 mL | OROMUCOSAL | Status: DC | PRN

## 2022-04-27 NOTE — Progress Notes (Signed)
Progress Note  Patient Name: [name] Date of Encounter: 04/27/2022  Primary Cardiologist: Virl Axe, MD   Subjective   Overnight received IV and lasix.  Patient notes that she feels better but not back to normal. No SOB but has heard wheezing.  Inpatient Medications    Scheduled Meds:  acetaminophen  650 mg Oral Daily   atorvastatin  20 mg Oral Daily   bethanechol  10 mg Oral BID   diclofenac Sodium  2 g Topical TID   diltiazem  120 mg Oral QPM   feeding supplement  1 Container Oral TID BM   gabapentin  200 mg Oral QHS   heparin injection (subcutaneous)  5,000 Units Subcutaneous Q8H   levothyroxine  50 mcg Oral Daily   multivitamin  1 tablet Oral Daily   nadolol  80 mg Oral Daily   pantoprazole  40 mg Oral Daily   predniSONE  2 mg Oral Q lunch   tamsulosin  0.4 mg Oral Daily   Continuous Infusions:  PRN Meds: acetaminophen, alum & mag hydroxide-simeth, bisacodyl, guaiFENesin-dextromethorphan, nitroGLYCERIN, polyvinyl alcohol, prochlorperazine **OR** prochlorperazine **OR** prochlorperazine, senna-docusate, sodium phosphate   Vital Signs    Vitals:   04/26/22 1942 04/27/22 0421 04/27/22 0500 04/27/22 0812  BP: 132/71 131/61  118/75  Pulse: 73 66  (!) 57  Resp: 16 18    Temp: 98.2 F (36.8 C) 98.7 F (37.1 C)    TempSrc:  Oral    SpO2: 99% 95%    Weight:   66 kg   Height:        Intake/Output Summary (Last 24 hours) at 04/27/2022 0934 Last data filed at 04/27/2022 0700 Gross per 24 hour  Intake 520 ml  Output 400 ml  Net 120 ml   Filed Weights   04/02/22 1739 04/26/22 0933 04/27/22 0500  Weight: 70.3 kg 67.9 kg 66 kg    Telemetry    NA   Physical Exam   Gen: no distress, elderly female   Neck: No JVD Cardiac: No Rubs or Gallops, systolic Murmur, IRIR +2 radial pulses Respiratory: Clear to auscultation bilaterally, normal effort, normal  respiratory rate GI: Soft, nontender, non-distended  Integument: Skin feels warm Neuro:  At  time of evaluation, alert and oriented to person/place/time/situation  Psych: Normal affect, patient feels better   Labs    Chemistry Recent Labs  Lab 04/22/22 0513 04/25/22 1446 04/27/22 0510  NA 141 140 138  K 4.4 4.5 3.8  CL 109 107 104  CO2 '25 24 26  '$ GLUCOSE 103* 191* 93  BUN '22 20 23  '$ CREATININE 1.42* 1.47* 1.43*  CALCIUM 9.1 9.6 9.0  PROT  --  5.9*  --   ALBUMIN  --  3.4*  --   AST  --  15  --   ALT  --  17  --   ALKPHOS  --  71  --   BILITOT  --  0.5  --   GFRNONAA 34* 32* 33*  ANIONGAP '7 9 8     '$ Hematology Recent Labs  Lab 04/22/22 0513 04/25/22 1446  WBC 6.2 7.7  RBC 2.95* 3.43*  HGB 8.6* 9.9*  HCT 27.6* 31.3*  MCV 93.6 91.3  MCH 29.2 28.9  MCHC 31.2 31.6  RDW 15.6* 15.9*  PLT 242 266    Cardiac EnzymesNo results for input(s): "TROPONINI" in the last 168 hours. No results for input(s): "TROPIPOC" in the last 168 hours.   BNP Recent Labs  Lab  04/25/22 1446  BNP 416.0*     DDimer No results for input(s): "DDIMER" in the last 168 hours.   Radiology    DG Chest 2 View  Result Date: 04/25/2022 CLINICAL DATA:  Shortness of breath. EXAM: CHEST - 2 VIEW COMPARISON:  Chest x-ray 11/23/2020, CT abdomen and pelvis 07/17/2021. FINDINGS: The heart is enlarged, unchanged. There is no focal lung infiltrate, pleural effusion or pneumothorax. There are severe degenerative changes of the left shoulder. Vertebroplasty changes are seen in the lower thoracic region. There is some mild compression deformities of the lower thoracic region, unchanged from 07/17/2021. IMPRESSION: 1. Cardiomegaly. 2. No other acute cardiopulmonary process. Electronically Signed   By: Ronney Asters M.D.   On: 04/25/2022 19:33    Patient Profile     88 y.o. female history of HFpEF and AF with new SOB whilc in recovery from [place]    Dyspnea  Acute on chronic diastolic HF HTN CKD stage IIIb - Will give additional 40 IV lasix - if feels better will transition to  lasix 40 mg PO daily for Monday - BP has improved on diuretics and off hydalazine  Persistent atrial fibrillation - No AC in the setting of recent ICH - will continue present BB and CCB    For questions or updates, please contact [facility] Please consult [email or web address] for contact info under Cardiology/STEMI.      Rudean Haskell, MD Cardiologist [facility] [place], [address] [place], [place] [address] [phone]  9:34 AM

## 2022-04-27 NOTE — Plan of Care (Signed)
  Problem: Consults Goal: RH STROKE PATIENT EDUCATION Description: See Patient Education module for education specifics  Outcome: Progressing   Problem: RH BOWEL ELIMINATION Goal: RH STG MANAGE BOWEL WITH ASSISTANCE Description: STG Manage Bowel with [place]. Outcome: Progressing Goal: RH STG MANAGE BOWEL W/MEDICATION W/ASSISTANCE Description: STG Manage Bowel with Medication with [place]. Outcome: Progressing   Problem: RH BLADDER ELIMINATION Goal: RH STG MANAGE BLADDER WITH ASSISTANCE Description: STG Manage Bladder With Min Assistance Outcome: Progressing Goal: RH STG MANAGE BLADDER WITH MEDICATION WITH ASSISTANCE Description: STG Manage Bladder With Medication With [place]. Outcome: Progressing   Problem: RH SKIN INTEGRITY Goal: RH STG MAINTAIN SKIN INTEGRITY WITH ASSISTANCE Description: STG Maintain Skin Integrity With [place]. Outcome: Progressing Goal: RH STG ABLE TO PERFORM INCISION/WOUND CARE W/ASSISTANCE Description: STG Able To Perform Incision/Wound Care With [employer]. Outcome: Progressing   Problem: RH SAFETY Goal: RH STG ADHERE TO SAFETY PRECAUTIONS W/ASSISTANCE/DEVICE Description: STG Adhere to Safety Precautions With Cues and Reminders. Outcome: Progressing Goal: RH STG DECREASED RISK OF FALL WITH ASSISTANCE Description: STG Decreased Risk of Fall With [employer]. Outcome: Progressing   Problem: RH COGNITION-NURSING Goal: RH STG USES MEMORY AIDS/STRATEGIES W/ASSIST TO PROBLEM SOLVE Description: STG Uses Memory Aids/Strategies With Min Assistance to Problem Solve. Outcome: Progressing Goal: RH STG ANTICIPATES NEEDS/CALLS FOR ASSIST W/ASSIST/CUES Description: STG Anticipates Needs/Calls for Assist With Cues and Reminders. Outcome: Progressing   Problem: RH KNOWLEDGE DEFICIT Goal: RH STG INCREASE KNOWLEDGE OF HYPERTENSION Description: Patient and caregivers to demonstrate knowledge of HTN medication management, dietary  recommendations, BP parameters with educational materials and handouts provided by staff with min assist at discharge. Outcome: Progressing Goal: RH STG INCREASE KNOWLEDGE OF DYSPHAGIA/FLUID INTAKE Description: Patient and caregivers to demonstrate knowledge of dysphagia diets, safe swallowing techniques with educational materials and handouts provided by staff with min assist at discharge. Outcome: Progressing Goal: RH STG INCREASE KNOWLEGDE OF HYPERLIPIDEMIA Description: Patient and caregiver will demonstrate knowledge of HLD medications, dietary recommendations, lab values with educational materials and handouts provided by staff with min assist at discharge. Outcome: Progressing

## 2022-04-27 NOTE — Progress Notes (Addendum)
PROGRESS NOTE   Subjective/Complaints: Patient seen sitting in chair today on room air says that her shortness of breath is better today did receive IV Lasix yesterday and being followed by cardiology to evaluate if further doses of IV's Lasix were needed.  ROS: Patient denies fever, sore throat ,rash, blurred vision, dizziness, nausea, vomiting, diarrhea, cough, chest pain, joint/back/pain/neck pain, headache, or mood change.   Objective:   DG Chest 2 View  Result Date: 04/25/2022 CLINICAL DATA:  Shortness of breath. EXAM: CHEST - 2 VIEW COMPARISON:  Chest x-ray 11/23/2020, CT abdomen and pelvis 07/17/2021. FINDINGS: The heart is enlarged, unchanged. There is no focal lung infiltrate, pleural effusion or pneumothorax. There are severe degenerative changes of the left shoulder. Vertebroplasty changes are seen in the lower thoracic region. There is some mild compression deformities of the lower thoracic region, unchanged from 07/17/2021. IMPRESSION: 1. Cardiomegaly. 2. No other acute cardiopulmonary process. Electronically Signed   By: Ronney Asters M.D.   On: 04/25/2022 19:33    Recent Labs    04/25/22 1446  WBC 7.7  HGB 9.9*  HCT 31.3*  PLT 266       Recent Labs    04/25/22 1446 04/27/22 0510  NA 140 138  K 4.5 3.8  CL 107 104  CO2 24 26  GLUCOSE 191* 93  BUN 20 23  CREATININE 1.47* 1.43*  CALCIUM 9.6 9.0        Intake/Output Summary (Last 24 hours) at 04/27/2022 1308 Last data filed at 04/27/2022 0700 Gross per 24 hour  Intake 400 ml  Output --  Net 400 ml        Physical Exam: Vital Signs Blood pressure 118/75, pulse (!) 57, temperature 98.7 F (37.1 C), temperature source Oral, resp. rate 18, height '5\' 6"'$  (1.676 m), weight 66 kg, SpO2 95 %.    Constitutional: Not in distress.  Vital signs reviewed. HEENT: NCAT, ED OMI, oral membranes moist. Neck: Supple Cardiovascular: IRR with systolic murmur.   No JVD. Respiratory: CTA bilaterally without wheezes or rails with conversation or activity can have some dyspnea however lungs are clear GI/abdomen: BS positive all 4 quadrants, nontender nondistended Extremities: No clubbing cyanosis or edema Psych: Cooperative and pleasant Skin:Clean dry and intact without signs of breakdown. Neuro: Alert and oriented to person place and situation (reason for hospitalization.  Appears to have more appropriate and focused reasoning able to follow commands with improvement in language still has a minimal right central S.  RUE 3-4/5 possibly greater.  RLE 3-3+/5.  Sensation intact to light touch and pain. Musculoskeletal: Less tenderness of her left shoulder.  No leg pain    Assessment/Plan: 1. Functional deficits which require 3+ hours per day of interdisciplinary therapy in a comprehensive inpatient rehab setting. Physiatrist is providing close team supervision and 24 hour management of active medical problems listed below. Physiatrist and rehab team continue to assess barriers to discharge/monitor patient progress toward functional and medical goals  Care Tool:  Bathing    Body parts bathed by patient: Right arm, Left arm, Chest, Abdomen, Front perineal area, Buttocks, Right upper leg, Left upper leg, Face   Body parts bathed by helper: Buttocks  Bathing assist Assist Level: Minimal Assistance - Patient > 75%     Upper Body Dressing/Undressing Upper body dressing   What is the patient wearing?: Pull over shirt    Upper body assist Assist Level: Moderate Assistance - Patient 50 - 74%    Lower Body Dressing/Undressing Lower body dressing      What is the patient wearing?: Pants, Incontinence brief     Lower body assist Assist for lower body dressing: Maximal Assistance - Patient 25 - 49%     Toileting Toileting    Toileting assist Assist for toileting: Maximal Assistance - Patient 25 - 49%     Transfers Chair/bed  transfer  Transfers assist     Chair/bed transfer assist level: Moderate Assistance - Patient 50 - 74% (stand pivot with RW)     Locomotion Ambulation   Ambulation assist   Ambulation activity did not occur: Safety/medical concerns  Assist level: Moderate Assistance - Patient 50 - 74% Assistive device: Walker-Eva Max distance: 45   Walk 10 feet activity   Assist  Walk 10 feet activity did not occur: Safety/medical concerns  Assist level: Moderate Assistance - Patient - 50 - 74% Assistive device: Walker-Eva   Walk 50 feet activity   Assist Walk 50 feet with 2 turns activity did not occur: Safety/medical concerns         Walk 150 feet activity   Assist Walk 150 feet activity did not occur: Safety/medical concerns         Walk 10 feet on uneven surface  activity   Assist Walk 10 feet on uneven surfaces activity did not occur: Safety/medical concerns         Wheelchair     Assist Is the patient using a wheelchair?: Yes Type of Wheelchair: Manual    Wheelchair assist level: Minimal Assistance - Patient > 75% Max wheelchair distance: 25    Wheelchair 50 feet with 2 turns activity    Assist        Assist Level: Maximal Assistance - Patient 25 - 49% (Per PT documentation of 20 feet)   Wheelchair 150 feet activity     Assist      Assist Level: Total Assistance - Patient < 25% (Per PT documentation of 20 feet)   Blood pressure 118/75, pulse (!) 57, temperature 98.7 F (37.1 C), temperature source Oral, resp. rate 18, height '5\' 6"'$  (1.676 m), weight 66 kg, SpO2 95 %.  Medical Problem List and Plan: 1. Functional deficits secondary to left thalamic intracranial hemorrhage             -patient may shower             -ELOS/Goals: 14-16 days, supervision to min assist  - mental status improved   -minimizing neurosedating meds, maximize sleep  -Continue CIR therapies including PT, OT, and SLP   -family now considering SNF given her  projected care needs. Family ed 2.  Antithrombotics: -DVT/anticoagulation:  Pharmaceutical: Heparin             -antiplatelet therapy: none 3. Pain: continue Tylenol as needed.    -xray of left shoulder demonstrates significant DJD, old humeral neck fx  -added voltaren gel to left shoulder with benefit  -rom to tolerance, discussed with patient the importance maintaining rom  -6/13 reduced gabapentin back to '200mg'$    -tylenol scheduled at 2000  -6/16 overall her pain improved 6/17 continue gabapentin 200 mg, and scheduled Tylenol 4. Mood/sleep: team providing ego support             -  antipsychotic agents: n/a  -family is providing  emotional support as well  -melatonin dc'ed d/t questionable side effects 5. Neuropsych: This patient is not capable of making decisions on her own behalf. 6. Skin/Wound Care: Routine skin care checks 7. Fluids/Electrolytes/Nutrition:               --encouraging PO  - intake reasonable  -  Boost Breeze to improve protein 8. Hypertension: continue diltiazem, hydralazine, nadolol. Home losartan held  - Holding nadolol prn for hypotension   -have decreased hydralazine to '10mg'$    6/16 sbp with some increase. Continue to observe    04/27/2022    8:12 AM 04/27/2022    5:00 AM 04/27/2022    4:21 AM  Vitals with BMI  Weight  145 lbs 8 oz   BMI  59.1   Systolic 638  466  Diastolic 75  61  Pulse 57  66    6/17 SBP ranging 118-131, currently taking nadolol tab 80 mg daily and Cardizem CD 120 mg q evening  9: Hyperlipidemia: continue atorvastatin 10: GI prophylaxis: continue pantoprazole 11: pAF: continue diltiazem. No AC until resolution of ICH, outpt follow-up  -HR has been controlled  6/16-appreciate cardiology assistance -w/u yesterday fairly unremarkable except BNP which is elevated -suspect that cardiology will recommend diuresis -need weight today 6/17 weight stable, trending slightly down with Lasix 40 mg given today and yesterday. Cardiology  following, additional dose of Lasix 40 mg IV today, may consider 40 mg po on Monday. Filed Weights   04/02/22 1739 04/26/22 0933 04/27/22 0500  Weight: 70.3 kg 67.9 kg 66 kg    12: Hypothyroidism: continue Synthroid 13: Urinary retention: continue Flomax             -now voiding without caths   -wean urecholine to off  14: UTI:  Keflex completed  -6/13- repeat ucx w/ Citrobacter >100k- fosfomycin x 1 on 6/11  15: Acute on chronic kidney disease: Cr still sl elevated from baseline at admit.   Has not been on any nephrotoxic meds 6/17 Crt still elevated from baseline continue to monitor with weekly labs    Latest Ref Rng & Units 04/27/2022    5:10 AM 04/25/2022    2:46 PM 04/22/2022    5:13 AM  BMP  Glucose 70 - 99 mg/dL 93  191  103   BUN 8 - 23 mg/dL '23  20  22   '$ Creatinine 0.44 - 1.00 mg/dL 1.43  1.47  1.42   Sodium 135 - 145 mmol/L 138  140  141   Potassium 3.5 - 5.1 mmol/L 3.8  4.5  4.4   Chloride 98 - 111 mmol/L 104  107  109   CO2 22 - 32 mmol/L '26  24  25   '$ Calcium 8.9 - 10.3 mg/dL 9.0  9.6  9.1      -intake reasonable  16: Leukocytosis: wbc's normal.   17: Anemia, chronic: no evidence of acute blood loss -hgb stable at 8.6 18: Polymyalgia rheumatica: prednisone 2 mg with lunch 19. Dysphagia:               -advanced to reg/thins per SLP 20. macular degeneration of the right eye, uses preservision at home. - improved  Continue use of artifical tears and Prosight.    LOS: 25 days A FACE TO FACE EVALUATION WAS PERFORMED  Luetta Nutting 04/27/2022, 1:08 PM

## 2022-04-27 NOTE — Progress Notes (Signed)
Speech Language Pathology Daily Session Note  Patient Details  Name: ALLISEN PIDGEON MRN: [id number] Date of Birth: 1922-12-09  Today's Date: 04/27/2022 SLP Individual Time: 1450-1532 SLP Individual Time Calculation (min): 42 min  Short Term Goals: Week 4: SLP Short Term Goal 1 (Week 4): STGs=LTGs due to ELOS  Skilled Therapeutic Interventions: Skilled ST treatment focused on cognitive-linguistic goals. SLP facilitated problem solving skills and anticipatory awareness through ID of unsafe scenarios and generating appropriate solutions with overall min A verbal cues. Pt also required extended processing time and min A verbal repetition for working memory and attention to task. SLP facilitated organization of grocery list items into categories from various recipes with sup A verbal cues. Patient was left in recliner with alarm activated and immediate needs within reach at end of session. Continue per current plan of care.      Pain  None/denied  Therapy/Group: Individual Therapy  Patty Sermons 04/27/2022, 3:30 PM

## 2022-04-27 NOTE — Progress Notes (Signed)
Physical Therapy Session Note  Patient Details  Name: CHARLESA EHLE MRN: [id number] Date of Birth: 07-20-1923  Today's Date: 04/27/2022 PT Individual Time: 0917-0959 PT Individual Time Calculation (min): 42 min   Short Term Goals: Week 3:  PT Short Term Goal 1 (Week 3): STGs = LTGs  Skilled Therapeutic Interventions/Progress Updates:     Pt received seated in recliner and agrees to therapy. No complaint of pain. Sit to stand and stand pivot transfer to [facility] with modA and cues for anterior trunk lean, hand placement, and sequencing. WC transport to gym for time management. Pt performs block practice of slide board transfers to/from [facility] and mat. PT demonstrates slideboard transfer and educates on head-hips relationship. Pt verbalizes understanding. Pt performs initial slideboard transfer with light modA to facilitate anterior trunk lean and sequencing. As repetitions increase, pt improves mechanics and eventually requires light minA to complete, with PT placing board under pt's thigh and providing cues for anterior trunk lean and hand placement. Pt consitently starts transfer with proper mechanics and trunk lean, and then shoulders begin drifting backward, lifting feet off ground and making transfer more difficulty and less safe. PT demonstrates pt's tendency and pt is able to largely correct, with improved anterior trunk lean and head-hips relationship. Pt completes multiple transfers from [facility] to mat table on the R and then back to [facility] on the L. Then pt switches and transfers from mat to [facility] on R and back to mat on L. Pt attempts to position board herself but cannot complete without assistance. WC transport back to room. Stand step transfer from [facility] to recliner with minA (!!) and improved sability to maneuver L lower extremity, with less pushing back to the R. Left seated with alarm intact and all needs within reach.  Therapy Documentation Precautions:  Precautions Precautions: Fall Precaution Comments:  R hemi Restrictions Weight Bearing Restrictions: No   Therapy/Group: Individual Therapy  Breck Coons, PT, DPT 04/27/2022, 4:48 PM

## 2022-04-28 LAB — BASIC METABOLIC PANEL
Anion gap: 13 (ref 5–15)
BUN: 27 mg/dL — ABNORMAL HIGH (ref 8–23)
CO2: 24 mmol/L (ref 22–32)
Calcium: 9.3 mg/dL (ref 8.9–10.3)
Chloride: 104 mmol/L (ref 98–111)
Creatinine, Ser: 1.42 mg/dL — ABNORMAL HIGH (ref 0.44–1.00)
GFR, Estimated: 34 mL/min — ABNORMAL LOW (ref >60)
Glucose, Bld: 94 mg/dL (ref 70–99)
Potassium: 3.7 mmol/L (ref 3.5–5.1)
Sodium: 141 mmol/L (ref 135–145)

## 2022-04-28 MED ORDER — HYDROCORTISONE 1 % EX CREA: TOPICAL_CREAM | Freq: Three times a day (TID) | CUTANEOUS | Status: DC

## 2022-04-28 MED ORDER — FUROSEMIDE 40 MG PO TABS
40.0000 mg | ORAL_TABLET | Freq: Every day | ORAL | Status: DC
  Administered 2022-04-29: 40 mg via ORAL
  Filled 2022-04-28: qty 1

## 2022-04-28 MED ORDER — HYDROCORTISONE 1 % EX CREA
TOPICAL_CREAM | Freq: Three times a day (TID) | CUTANEOUS | Status: DC | PRN
  Filled 2022-04-28: qty 28

## 2022-04-28 MED ORDER — WITCH HAZEL-GLYCERIN EX PADS
MEDICATED_PAD | CUTANEOUS | Status: DC | PRN
  Filled 2022-04-28: qty 100

## 2022-04-28 NOTE — Progress Notes (Signed)
PROGRESS NOTE   Subjective/Complaints: Patient seen while in bed today on room air.  Says that she is not short of breath at rest, but becomes short of breath with talking or activity. Cardiology following with plan for transition to po Lasix on Monday and BMP labs.   ROS: Patient denies fever, sore throat ,rash, blurred vision, dizziness, nausea, vomiting, diarrhea, cough, chest pain, joint/back/pain/neck pain, headache, or mood change.   Objective:   No results found.  Recent Labs    04/25/22 1446  WBC 7.7  HGB 9.9*  HCT 31.3*  PLT 266       Recent Labs    04/27/22 0510 04/28/22 0547  NA 138 141  K 3.8 3.7  CL 104 104  CO2 26 24  GLUCOSE 93 94  BUN 23 27*  CREATININE 1.43* 1.42*  CALCIUM 9.0 9.3        Intake/Output Summary (Last 24 hours) at 04/28/2022 1330 Last data filed at 04/28/2022 0700 Gross per 24 hour  Intake 456 ml  Output --  Net 456 ml        Physical Exam: Vital Signs Blood pressure 110/60, pulse (!) 52, temperature 98.1 F (36.7 C), temperature source Oral, resp. rate 18, height '5\' 6"'$  (1.676 m), weight 64.5 kg, SpO2 97 %.    Constitutional: Not in distress.  Vital signs reviewed. HEENT: NCAT, ED OMI, oral membranes moist. Neck: Supple Cardiovascular: IRR with systolic murmur.  No JVD. Respiratory: CTA bilaterally without wheezes or rails with conversation or activity can have some dyspnea however lungs are clear GI/abdomen: BS positive all 4 quadrants, nontender nondistended Extremities: No clubbing cyanosis or edema Psych: Cooperative and pleasant Skin:Clean dry and intact without signs of breakdown. Neuro: Alert and oriented to person place and situation (reason for hospitalization.  Appears to have more appropriate and focused reasoning able to follow commands with improvement in language still has a minimal right central S.  RUE 3-4/5 possibly greater.  RLE 3-3+/5.   Sensation intact to light touch and pain. Musculoskeletal: Less tenderness of her left shoulder. No leg pain.    Assessment/Plan: 1. Functional deficits which require 3+ hours per day of interdisciplinary therapy in a comprehensive inpatient rehab setting. Physiatrist is providing close team supervision and 24 hour management of active medical problems listed below. Physiatrist and rehab team continue to assess barriers to discharge/monitor patient progress toward functional and medical goals  Care Tool:  Bathing    Body parts bathed by patient: Right arm, Left arm, Chest, Abdomen, Front perineal area, Buttocks, Right upper leg, Left upper leg, Face   Body parts bathed by helper: Buttocks     Bathing assist Assist Level: Minimal Assistance - Patient > 75%     Upper Body Dressing/Undressing Upper body dressing   What is the patient wearing?: Pull over shirt    Upper body assist Assist Level: Moderate Assistance - Patient 50 - 74%    Lower Body Dressing/Undressing Lower body dressing      What is the patient wearing?: Pants, Incontinence brief     Lower body assist Assist for lower body dressing: Maximal Assistance - Patient 25 - 49%  Toileting Toileting    Toileting assist Assist for toileting: Maximal Assistance - Patient 25 - 49%     Transfers Chair/bed transfer  Transfers assist     Chair/bed transfer assist level: Moderate Assistance - Patient 50 - 74% (stand pivot with RW)     Locomotion Ambulation   Ambulation assist   Ambulation activity did not occur: Safety/medical concerns  Assist level: Moderate Assistance - Patient 50 - 74% Assistive device: Walker-Eva Max distance: 45   Walk 10 feet activity   Assist  Walk 10 feet activity did not occur: Safety/medical concerns  Assist level: Moderate Assistance - Patient - 50 - 74% Assistive device: Walker-Eva   Walk 50 feet activity   Assist Walk 50 feet with 2 turns activity did not occur:  Safety/medical concerns         Walk 150 feet activity   Assist Walk 150 feet activity did not occur: Safety/medical concerns         Walk 10 feet on uneven surface  activity   Assist Walk 10 feet on uneven surfaces activity did not occur: Safety/medical concerns         Wheelchair     Assist Is the patient using a wheelchair?: Yes Type of Wheelchair: Manual    Wheelchair assist level: Minimal Assistance - Patient > 75% Max wheelchair distance: 25    Wheelchair 50 feet with 2 turns activity    Assist        Assist Level: Maximal Assistance - Patient 25 - 49% (Per PT documentation of 20 feet)   Wheelchair 150 feet activity     Assist      Assist Level: Total Assistance - Patient < 25% (Per PT documentation of 20 feet)   Blood pressure 110/60, pulse (!) 52, temperature 98.1 F (36.7 C), temperature source Oral, resp. rate 18, height '5\' 6"'$  (1.676 m), weight 64.5 kg, SpO2 97 %.  Medical Problem List and Plan: 1. Functional deficits secondary to left thalamic intracranial hemorrhage             -patient may shower             -ELOS/Goals: 14-16 days, supervision to min assist  - mental status improved   -minimizing neurosedating meds, maximize sleep  -Continue CIR therapies including PT, OT, and SLP   -family now considering SNF given her projected care needs. Family ed 2.  Antithrombotics: -DVT/anticoagulation:  Pharmaceutical: Heparin             -antiplatelet therapy: none 3. Pain: continue Tylenol as needed.    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 Mood/sleep: team providing ego support             -antipsychotic agents:  n/a  -family is providing  emotional support as well  -melatonin dc'ed d/t questionable side effects 5. Neuropsych: This patient is not capable of making decisions on her own behalf. 6. Skin/Wound Care: Routine skin care checks 7. Fluids/Electrolytes/Nutrition:               --encouraging PO  - intake reasonable  -  Boost Breeze to improve protein 8. Hypertension: continue diltiazem, hydralazine, nadolol. Home losartan held  -  Holding nadolol prn for hypotension   -have decreased hydralazine to '10mg'$    6/16 sbp with some increase. Continue to observe    04/28/2022    9:50 AM 04/28/2022    5:16 AM 04/28/2022    4:14 AM  Vitals with BMI  Weight  142 lbs 3 oz   BMI  50.03   Systolic 704  888  Diastolic 60  67  Pulse 52  68    6/18 SBP ranging 118-131, currently taking nadolol tab 80 mg daily and Cardizem CD 120 mg q evening  9: Hyperlipidemia: continue atorvastatin 10: GI prophylaxis: continue pantoprazole 11: pAF: continue diltiazem. No AC until resolution of ICH, outpt follow-up  -HR has been controlled  6/16-appreciate cardiology assistance -w/u yesterday fairly unremarkable except BNP which is elevated -suspect that cardiology will recommend diuresis -need weight today 6/17 weight stable, trending slightly down with Lasix 40 mg given today and yesterday. Cardiology following, additional dose of Lasix 40 mg IV today, may consider 40 mg po on Monday. Filed Weights   04/26/22 0933 04/27/22 0500 04/28/22 0516  Weight: 67.9 kg 66 kg 64.5 kg  6/18 Daily weight down today at 64.5 kg.  BNP for 6/19, will transition to po Lasix on 6/19  12: Hypothyroidism: continue Synthroid 13: Urinary retention: continue Flomax             -now voiding without caths   -wean urecholine to off  14: UTI:  Keflex completed  -6/13- repeat ucx w/ Citrobacter >100k- fosfomycin x 1 on 6/11  15: Acute on chronic kidney disease: Cr still sl elevated from baseline at admit.   Has not been on any nephrotoxic  meds 6/17 Crt still elevated from baseline continue to monitor with weekly labs 6/18 BUN also mildly elevated, encourage adequate po intake.    Latest Ref Rng & Units 04/28/2022    5:47 AM 04/27/2022    5:10 AM 04/25/2022    2:46 PM  BMP  Glucose 70 - 99 mg/dL 94  93  191   BUN 8 - 23 mg/dL '27  23  20   '$ Creatinine 0.44 - 1.00 mg/dL 1.42  1.43  1.47   Sodium 135 - 145 mmol/L 141  138  140   Potassium 3.5 - 5.1 mmol/L 3.7  3.8  4.5   Chloride 98 - 111 mmol/L 104  104  107   CO2 22 - 32 mmol/L '24  26  24   '$ Calcium 8.9 - 10.3 mg/dL 9.3  9.0  9.6      -intake reasonable  16: Leukocytosis: wbc's normal.   17: Anemia, chronic: no evidence of acute blood loss -hgb stable at 8.6 18: Polymyalgia rheumatica: prednisone 2 mg with lunch 19. Dysphagia:               -advanced to reg/thins per SLP 20. macular degeneration of the right eye, uses preservision at home. - improved  Continue use of artifical tears and Prosight.    LOS: 26 days A FACE TO FACE EVALUATION WAS PERFORMED  [name] 04/28/2022, 1:30 PM

## 2022-04-28 NOTE — Progress Notes (Signed)
Received call from bedside nurse, Caryl Pina that patient was having flare up of hemorrhoids with a small amount of blood around anal area noted.  Placed order for Preparation H cream and Tucks pads for symptomatic relief.

## 2022-04-28 NOTE — Plan of Care (Signed)
  Problem: Consults Goal: RH STROKE PATIENT EDUCATION Description: See Patient Education module for education specifics  Outcome: Progressing   Problem: RH BOWEL ELIMINATION Goal: RH STG MANAGE BOWEL WITH ASSISTANCE Description: STG Manage Bowel with [place]. Outcome: Progressing Goal: RH STG MANAGE BOWEL W/MEDICATION W/ASSISTANCE Description: STG Manage Bowel with Medication with [place]. Outcome: Progressing   Problem: RH BLADDER ELIMINATION Goal: RH STG MANAGE BLADDER WITH ASSISTANCE Description: STG Manage Bladder With Min Assistance Outcome: Progressing Goal: RH STG MANAGE BLADDER WITH MEDICATION WITH ASSISTANCE Description: STG Manage Bladder With Medication With [place]. Outcome: Progressing   Problem: RH SKIN INTEGRITY Goal: RH STG MAINTAIN SKIN INTEGRITY WITH ASSISTANCE Description: STG Maintain Skin Integrity With [place]. Outcome: Progressing Goal: RH STG ABLE TO PERFORM INCISION/WOUND CARE W/ASSISTANCE Description: STG Able To Perform Incision/Wound Care With [employer]. Outcome: Progressing   Problem: RH SAFETY Goal: RH STG ADHERE TO SAFETY PRECAUTIONS W/ASSISTANCE/DEVICE Description: STG Adhere to Safety Precautions With Cues and Reminders. Outcome: Progressing Goal: RH STG DECREASED RISK OF FALL WITH ASSISTANCE Description: STG Decreased Risk of Fall With [employer]. Outcome: Progressing   Problem: RH COGNITION-NURSING Goal: RH STG USES MEMORY AIDS/STRATEGIES W/ASSIST TO PROBLEM SOLVE Description: STG Uses Memory Aids/Strategies With Min Assistance to Problem Solve. Outcome: Progressing Goal: RH STG ANTICIPATES NEEDS/CALLS FOR ASSIST W/ASSIST/CUES Description: STG Anticipates Needs/Calls for Assist With Cues and Reminders. Outcome: Progressing   Problem: RH KNOWLEDGE DEFICIT Goal: RH STG INCREASE KNOWLEDGE OF HYPERTENSION Description: Patient and caregivers to demonstrate knowledge of HTN medication management, dietary  recommendations, BP parameters with educational materials and handouts provided by staff with min assist at discharge. Outcome: Progressing Goal: RH STG INCREASE KNOWLEDGE OF DYSPHAGIA/FLUID INTAKE Description: Patient and caregivers to demonstrate knowledge of dysphagia diets, safe swallowing techniques with educational materials and handouts provided by staff with min assist at discharge. Outcome: Progressing Goal: RH STG INCREASE KNOWLEGDE OF HYPERLIPIDEMIA Description: Patient and caregiver will demonstrate knowledge of HLD medications, dietary recommendations, lab values with educational materials and handouts provided by staff with min assist at discharge. Outcome: Progressing

## 2022-04-28 NOTE — Progress Notes (Signed)
Progress Note  Patient Name: [name] Date of Encounter: 04/28/2022  Primary Cardiologist: Virl Axe, MD   Subjective   Patient is back to baseline.   She has lots ~ 3 L.  She has no palpitations, SOB, or CP.  Inpatient Medications    Scheduled Meds:  acetaminophen  650 mg Oral Daily   atorvastatin  20 mg Oral Daily   bethanechol  10 mg Oral BID   diclofenac Sodium  2 g Topical TID   diltiazem  120 mg Oral QPM   feeding supplement  1 Container Oral TID BM   furosemide  40 mg Intravenous Daily   gabapentin  200 mg Oral QHS   heparin injection (subcutaneous)  5,000 Units Subcutaneous Q8H   levothyroxine  50 mcg Oral Daily   multivitamin  1 tablet Oral Daily   nadolol  80 mg Oral Daily   pantoprazole  40 mg Oral Daily   predniSONE  2 mg Oral Q lunch   tamsulosin  0.4 mg Oral Daily   Continuous Infusions:  PRN Meds: acetaminophen, alum & mag hydroxide-simeth, bisacodyl, guaiFENesin-dextromethorphan, nitroGLYCERIN, mouth rinse, polyvinyl alcohol, prochlorperazine **OR** prochlorperazine **OR** prochlorperazine, senna-docusate, sodium phosphate   Vital Signs    Vitals:   04/27/22 1429 04/27/22 2001 04/28/22 0414 04/28/22 0516  BP: (!) 105/55 135/77 138/67   Pulse: 70 66 68   Resp: '17 17 18   '$ Temp: (!) 97.5 F (36.4 C) 97.6 F (36.4 C) 98.1 F (36.7 C)   TempSrc: Oral Oral Oral   SpO2: 97% 99% 97%   Weight:    64.5 kg  Height:        Intake/Output Summary (Last 24 hours) at 04/28/2022 0755 Last data filed at 04/27/2022 1700 Gross per 24 hour  Intake 472 ml  Output --  Net 472 ml   Filed Weights   04/26/22 0933 04/27/22 0500 04/28/22 0516  Weight: 67.9 kg 66 kg 64.5 kg    Telemetry    NA   Physical Exam   Gen: no distress, elderly female   Neck: No JVD Cardiac: No Rubs or Gallops, systolic murmur, IRIR +2 radial pulses Respiratory: Clear to auscultation bilaterally, normal effort, normal  respiratory rate GI: Soft, nontender,  non-distended  Integument: Skin feels warm Neuro:  At time of evaluation, alert and oriented to person/place/time/situation  Psych: Normal affect, patient feels better   Labs    Chemistry Recent Labs  Lab 04/22/22 0513 04/25/22 1446 04/27/22 0510  NA 141 140 138  K 4.4 4.5 3.8  CL 109 107 104  CO2 '25 24 26  '$ GLUCOSE 103* 191* 93  BUN '22 20 23  '$ CREATININE 1.42* 1.47* 1.43*  CALCIUM 9.1 9.6 9.0  PROT  --  5.9*  --   ALBUMIN  --  3.4*  --   AST  --  15  --   ALT  --  17  --   ALKPHOS  --  71  --   BILITOT  --  0.5  --   GFRNONAA 34* 32* 33*  ANIONGAP '7 9 8     '$ Hematology Recent Labs  Lab 04/22/22 0513 04/25/22 1446  WBC 6.2 7.7  RBC 2.95* 3.43*  HGB 8.6* 9.9*  HCT 27.6* 31.3*  MCV 93.6 91.3  MCH 29.2 28.9  MCHC 31.2 31.6  RDW 15.6* 15.9*  PLT 242 266    Cardiac EnzymesNo results for input(s): "TROPONINI" in the last 168 hours. No results for input(s): "TROPIPOC" in  the last 168 hours.   BNP Recent Labs  Lab 04/25/22 1446  BNP 416.0*     DDimer No results for input(s): "DDIMER" in the last 168 hours.   Radiology    No results found.  Patient Profile     88 y.o. female history of HFpEF and AF with new SOB whilc in recovery from [place]    Dyspnea  Acute on chronic diastolic HF HTN CKD stage IIIb - transitioning to PO lasix (no hydralazine)  - BMP on Monday - if continues to feel well IV in her hand may be removed  Persistent atrial fibrillation - No AC in the setting of recent [place] - will continue present BB and CCB    For questions or updates, please contact [facility] Please consult [email or web address] for contact info under Cardiology/STEMI.      Rudean Haskell, MD Cardiologist [facility] [place], [address] [place], [place] [address] [phone]  7:55 AM

## 2022-04-29 DIAGNOSIS — N179 Acute kidney failure, unspecified: Secondary | ICD-10-CM

## 2022-04-29 DIAGNOSIS — N189 Chronic kidney disease, unspecified: Secondary | ICD-10-CM

## 2022-04-29 LAB — CBC
HCT: 27.6 % — ABNORMAL LOW (ref 36.0–46.0)
Hemoglobin: 9 g/dL — ABNORMAL LOW (ref 12.0–15.0)
MCH: 29.4 pg (ref 26.0–34.0)
MCHC: 32.6 g/dL (ref 30.0–36.0)
MCV: 90.2 fL (ref 80.0–100.0)
Platelets: 221 10*3/uL (ref 150–400)
RBC: 3.06 MIL/uL — ABNORMAL LOW (ref 3.87–5.11)
RDW: 15.7 % — ABNORMAL HIGH (ref 11.5–15.5)
WBC: 7.7 10*3/uL (ref 4.0–10.5)
nRBC: 0 % (ref 0.0–0.2)

## 2022-04-29 LAB — BASIC METABOLIC PANEL
Anion gap: 8 (ref 5–15)
BUN: 30 mg/dL — ABNORMAL HIGH (ref 8–23)
CO2: 28 mmol/L (ref 22–32)
Calcium: 9.1 mg/dL (ref 8.9–10.3)
Chloride: 106 mmol/L (ref 98–111)
Creatinine, Ser: 1.4 mg/dL — ABNORMAL HIGH (ref 0.44–1.00)
GFR, Estimated: 34 mL/min — ABNORMAL LOW (ref >60)
Glucose, Bld: 96 mg/dL (ref 70–99)
Potassium: 3.8 mmol/L (ref 3.5–5.1)
Sodium: 142 mmol/L (ref 135–145)

## 2022-04-29 MED ORDER — FUROSEMIDE 40 MG PO TABS
40.0000 mg | ORAL_TABLET | Freq: Two times a day (BID) | ORAL | Status: DC
  Administered 2022-04-29 – 2022-04-30 (×2): 40 mg via ORAL
  Filled 2022-04-29 (×2): qty 1

## 2022-04-29 NOTE — Progress Notes (Signed)
Speech Language Pathology Daily Session Note  Patient Details  Name: [name] MRN: [id number] Date of Birth: [birth year]  Today's Date: 04/29/2022 SLP Individual Time: 7672-0947 SLP Individual Time Calculation (min): 45 min  Short Term Goals: Week 4: SLP Short Term Goal 1 (Week 4): STGs=LTGs due to ELOS  Skilled Therapeutic Interventions: Skilled treatment session focused on cognitive goals. Upon arrival, patient was upright in the recliner and requested to transfer to the recliner. Patient performed transfer via the [facility] with Min verbal cues needed for problem solving with task. Patient recalled events from the weekend independently but demonstrated intermittent confusion regarding covering physicians. SLP facilitated session by providing Mod A verbal cues for problem solving and working memory during a mildly complex medication management task of identifying errors in a BID pill box.  Patient left upright in the recliner with alarm on and all needs within reach. Continue with current plan of care.      Pain No/Denies Pain   Therapy/Group: Individual Therapy  [name] 04/29/2022, 12:53 PM

## 2022-04-29 NOTE — Progress Notes (Addendum)
Patient ID: QUANITA BARONA, female   DOB: 09/19/1923, 88 y.o.   MRN: [id number]  SW received message from pt son Gershon Mussel stating after the family toured [place] this weekend and like this location. Would like to move forward with placement.   SW confirmed with [facility] bed offer extended. This SW to submit insurance auth for SNF approval.   SW submitted insurance auth for SNF placement with Amanda[facility] for [employer] [phone]; ref [id number]). SW faxed requested clinicals.  1506-SW received phone call from [place] with [facility] reporting that they require a demographic sheet that has pt [employer] ID#. SW faxed over.   SW received phone call from [facility] reporting pt approved for 3 days, with auth beginning on 6/20-6/22 with clinicals due on 6/22; CM is [facility] fax clinicals to [phone]. [facility] ambulance pick up at 10am on 6/20. SW called pt son Gershon Mussel to inform on above. States his brother Lucianne Lei is here in the room and asked if SW could share with mother and brother.   SW updated Kelly/Admissions with [employer]. Pt rm #[id number] and nurse report [phone].  SW met with pt and pt son Lucianne Lei in room to provide above updates.   Loralee Pacas, MSW, [place] Office: [phone] Cell: [phone] Fax: [phone]

## 2022-04-29 NOTE — Progress Notes (Addendum)
Progress Note  Patient Name: [name] Date of Encounter: 04/29/2022  Primary Cardiologist: Virl Axe, MD   Subjective   Bed wt has her up 6 lbs, probably not accurate Nurse will try standing wt with PT, but she might not be able to do it.  Says she is breathing better, but does not move around much  Inpatient Medications    Scheduled Meds:  acetaminophen  650 mg Oral Daily   atorvastatin  20 mg Oral Daily   diclofenac Sodium  2 g Topical TID   diltiazem  120 mg Oral QPM   feeding supplement  1 Container Oral TID BM   furosemide  40 mg Oral BID   gabapentin  200 mg Oral QHS   heparin injection (subcutaneous)  5,000 Units Subcutaneous Q8H   levothyroxine  50 mcg Oral Daily   multivitamin  1 tablet Oral Daily   nadolol  80 mg Oral Daily   pantoprazole  40 mg Oral Daily   predniSONE  2 mg Oral Q lunch   tamsulosin  0.4 mg Oral Daily   Continuous Infusions:  PRN Meds: acetaminophen, alum & mag hydroxide-simeth, bisacodyl, guaiFENesin-dextromethorphan, hydrocortisone cream, nitroGLYCERIN, mouth rinse, polyvinyl alcohol, prochlorperazine **OR** prochlorperazine **OR** prochlorperazine, senna-docusate, sodium phosphate, witch hazel-glycerin   Vital Signs    Vitals:   04/28/22 1408 04/28/22 1941 04/29/22 0335 04/29/22 0501  BP: (!) 109/55 140/82 (!) 155/77   Pulse: 67 72 82   Resp: '17 18 18   '$ Temp: (!) 97.4 F (36.3 C) 98 F (36.7 C) 97.9 F (36.6 C)   TempSrc:  Oral Oral   SpO2: 97% 99% 99%   Weight:    67.3 kg  Height:        Intake/Output Summary (Last 24 hours) at 04/29/2022 1030 Last data filed at 04/29/2022 0721 Gross per 24 hour  Intake 653 ml  Output --  Net 653 ml   Filed Weights   04/27/22 0500 04/28/22 0516 04/29/22 0501  Weight: 66 kg 64.5 kg 67.3 kg    Telemetry    NA   Physical Exam   GEN: No acute distress.   Neck: JVD 10 cm Cardiac: Irreg R&R, soft murmur, no rubs, or gallops.  Respiratory: diminished to auscultation  bilaterally with rales in the bases. GI: Soft, nontender, non-distended  MS: No edema; No deformity. Neuro:  Nonfocal, oriented Psych: Normal affect   Labs    Chemistry Recent Labs  Lab 04/25/22 1446 04/27/22 0510 04/28/22 0547 04/29/22 0524  NA 140 138 141 142  K 4.5 3.8 3.7 3.8  CL 107 104 104 106  CO2 '24 26 24 28  '$ GLUCOSE 191* 93 94 96  BUN 20 23 27* 30*  CREATININE 1.47* 1.43* 1.42* 1.40*  CALCIUM 9.6 9.0 9.3 9.1  PROT 5.9*  --   --   --   ALBUMIN 3.4*  --   --   --   AST 15  --   --   --   ALT 17  --   --   --   ALKPHOS 71  --   --   --   BILITOT 0.5  --   --   --   GFRNONAA 32* 33* 34* 34*  ANIONGAP '9 8 13 8     '$ Hematology Recent Labs  Lab 04/25/22 1446 04/29/22 0524  WBC 7.7 7.7  RBC 3.43* 3.06*  HGB 9.9* 9.0*  HCT 31.3* 27.6*  MCV 91.3 90.2  MCH 28.9 29.4  MCHC 31.6 32.6  RDW 15.9* 15.7*  PLT 266 221    Cardiac EnzymesNo results for input(s): "TROPONINI" in the last 168 hours. No results for input(s): "TROPIPOC" in the last 168 hours.   BNP Recent Labs  Lab 04/25/22 1446  BNP 416.0*     DDimer No results for input(s): "DDIMER" in the last 168 hours.   Radiology    No results found.  Patient Profile     88 y.o. female history of HFpEF and AF with new SOB while in recovery from [place]    Dyspnea  Acute on chronic diastolic HF HTN CKD stage IIIb - transitioning to PO lasix (no hydralazine), IV has been removed - BMP with slight uptrend in BUN/Cr - however, still w/ some overload on exam - not on diuretic pta, discuss w/ MD  Persistent atrial fibrillation - No AC in the setting of recent ICH - will continue present BB and CCB    For questions or updates, please contact [facility] Please consult [email or web address] for contact info under Cardiology/STEMI.   Rhonda Barrett, PA-C 04/29/2022 11:00 AM  History and all data above reviewed.  Patient examined.  I agree with the findings as above.  She thinks  that she is breathing OK.  Possibly to rehab out patient tomorrow. The patient exam reveals MVH:QIONGEXBM RR  ,  Lungs:  Bilateral dependent crackles somewhat clear with cough but persistent at the left base.   ,  Abd: Positive bowel sounds, no rebound no guarding, Ext No edema  .  All available labs, radiology testing, previous records reviewed. Agree with documented assessment and plan.   Acute on chronic diastolic HF:  I suspect she will need some mild baseline dose of PO Lasix at discharge.  Likely 20 MG PO but we can comment tomorrow.    Jeneen Rinks Aldyn Toon  11:26 AM  04/29/2022

## 2022-04-29 NOTE — Progress Notes (Signed)
Inpatient Rehabilitation Discharge Medication Review by a Pharmacist  A complete drug regimen review was completed for this patient to identify any potential clinically significant medication issues.  High Risk Drug Classes Is patient taking? Indication by Medication  Antipsychotic No   Anticoagulant No   Antibiotic No   Opioid No   Antiplatelet No   Hypoglycemics/insulin No   Vasoactive Medication Yes Diltiazem, nadolol, lasix, tamsulosin, NitroSL- hypertension, BPH, angina  Chemotherapy No   Other Yes Lipitor- HLD Gabapentin- neuropathic pain Synthroid- hypothyroidism Protonix- GERD     Type of Medication Issue Identified Description of Issue Recommendation(s)  Drug Interaction(s) (clinically significant)     Duplicate Therapy     Allergy     No Medication Administration End Date     Incorrect Dose     Additional Drug Therapy Needed     Significant med changes from prior encounter (inform family/care partners about these prior to discharge).    Other  PTA meds: Vitamin D Restart PTA meds when and if necessary during CIR admission or at time of discharge, if warranted    Clinically significant medication issues were identified that warrant physician communication and completion of prescribed/recommended actions by midnight of the next day:  No  Time spent performing this drug regimen review (minutes):  30   [name] BS, PharmD, BCPS Clinical Pharmacist 04/29/2022 1:08 PM  Contact: [phone] after 3 PM  "Be curious, not judgmental..." -Jamal Maes

## 2022-04-29 NOTE — Progress Notes (Signed)
Occupational Therapy Discharge Summary  Patient Details  Name: [name] MRN: [id number] Date of Birth: [birth year]  Patient has met 8 of 8 long term goals due to improved activity tolerance, improved balance, postural control, ability to compensate for deficits, functional use of  RIGHT upper and RIGHT lower extremity, improved attention, improved awareness, and improved coordination.  Patient to discharge at [facility] Assist level.  Patient's care partner unavailable to provide the necessary physical assistance at discharge, therefore pt dc to SNF for continued occupational therapy in next venue of care.    Reasons goals not met: n/a  Recommendation:  Patient will benefit from ongoing skilled OT services in skilled nursing facility setting to continue to advance functional skills in the area of BADL, Reduce care partner burden, and functional use of R UE .  Equipment: No equipment provided  Reasons for discharge: treatment goals met and discharge from hospital  Patient/family agrees with progress made and goals achieved: Yes  OT Discharge Precautions/Restrictions  Precautions Precautions: Fall Precaution Comments: R hemi Restrictions Weight Bearing Restrictions: No Pain Pain Assessment Pain Scale: 0-10 Pain Score: 0-No pain ADL ADL Eating: Set up Where Assessed-Eating: Bed level Grooming: Supervision/safety Where Assessed-Grooming: Standing at sink, Sitting at sink Upper Body Bathing: Supervision/safety Where Assessed-Upper Body Bathing: Sitting at sink Lower Body Bathing: Minimal assistance Where Assessed-Lower Body Bathing: Standing at sink, Sitting at sink Upper Body Dressing: Supervision/safety Where Assessed-Upper Body Dressing: Sitting at sink Lower Body Dressing: Minimal assistance Where Assessed-Lower Body Dressing: Standing at sink, Sitting at sink Toileting: Minimal assistance Where Assessed-Toileting: Glass blower/designer: Hydrologist Method: Other (comment) (sara steady) Tub/Shower Transfer: Unable to assess Social research officer, government: Minimal assistance Perception  Perception: Impaired Inattention/Neglect: Does not attend to right visual field (much improved since eval) Praxis Praxis: Impaired Praxis Impairment Details: Motor planning Praxis-Other Comments: Much improved since eval Cognition Cognition Overall Cognitive Status: Impaired/Different from baseline Arousal/Alertness: Awake/alert Orientation Level: Person;Place;Situation Person: Oriented Place: Oriented Situation: Oriented Memory: Impaired Memory Impairment: Storage deficit;Decreased recall of new information;Retrieval deficit Attention: Selective Selective Attention: Appears intact Awareness: Impaired Safety/Judgment: Appears intact Brief Interview for Mental Status (BIMS) Repetition of Three Words (First Attempt): 3 Temporal Orientation: Year: Correct Temporal Orientation: Month: Accurate within 5 days Temporal Orientation: Day: Correct Recall: "Sock": Yes, no cue required Recall: "Blue": Yes, no cue required Recall: "Bed": Yes, no cue required BIMS Summary Score: 15 Sensation Sensation Light Touch: Appears Intact Hot/Cold: Appears Intact Proprioception: Impaired Detail Proprioception Impaired Details: Impaired RLE;Impaired RUE Coordination Gross Motor Movements are Fluid and Coordinated: No Fine Motor Movements are Fluid and Coordinated: No Coordination and Movement Description: R hemi, decreased smoothness and accuracy in R UE, but much improved since eval Motor  Motor Motor: Hemiplegia Motor - Discharge Observations: R hemi, much improved since eval Mobility  Bed Mobility Supine to Sit: Supervision/Verbal cueing Sit to Supine: Supervision/Verbal cueing Transfers Sit to Stand: Minimal Assistance - Patient > 75% Stand to Sit: Minimal Assistance - Patient > 75%  Trunk/Postural Assessment  Cervical Assessment Cervical  Assessment: Within Functional Limits Thoracic Assessment Thoracic Assessment: Exceptions to [facility] (rounded shoulders.) Lumbar Assessment Lumbar Assessment: Exceptions to [facility] (post pelvic tilt.) Postural Control Postural Control: Deficits on evaluation Righting Reactions: delayed and inadequate  Balance Balance Balance Assessed: Yes Static Sitting Balance Static Sitting - Balance Support: Feet supported Static Sitting - Level of Assistance: 5: Stand by assistance Dynamic Sitting Balance Dynamic Sitting - Balance Support: Feet supported Dynamic Sitting - Level of Assistance:  5: Stand by assistance Static Standing Balance Static Standing - Balance Support: During functional activity;Bilateral upper extremity supported Static Standing - Level of Assistance: 4: Min assist Dynamic Standing Balance Dynamic Standing - Balance Support: During functional activity;Bilateral upper extremity supported Dynamic Standing - Level of Assistance: 4: Min assist Extremity/Trunk Assessment RUE Assessment RUE Assessment: Exceptions to [facility] Active Range of Motion (AROM) Comments: 170 degrees shoulder flexion, full elbow and hand Brunstrum levels for arm and hand: Arm;Hand Brunstrum level for arm: Stage V Relative Independence from Synergy Brunstrum level for hand: Stage VI Isolated joint movements LUE Assessment LUE Assessment: Exceptions to [facility] General Strength Comments: Baseline limitations in AROM, shoulder flexion to 90 degrees, abduction to 30 degrees   Daneen Schick Kennisha Qin 04/29/2022, 11:05 PM

## 2022-04-29 NOTE — Progress Notes (Signed)
PROGRESS NOTE   Subjective/Complaints:  No complaints this morning Working with SLP Confused about Dr. Naaman Plummer being off this week, thought he was off last week.   ROS: Patient denies fever, sore throat ,rash, blurred vision, dizziness, nausea, vomiting, diarrhea, cough, chest pain, joint/back/pain/neck pain, headache, or mood change, shortness of breath   Objective:   No results found.  Recent Labs    04/29/22 0524  WBC 7.7  HGB 9.0*  HCT 27.6*  PLT 221       Recent Labs    04/28/22 0547 04/29/22 0524  NA 141 142  K 3.7 3.8  CL 104 106  CO2 24 28  GLUCOSE 94 96  BUN 27* 30*  CREATININE 1.42* 1.40*  CALCIUM 9.3 9.1        Intake/Output Summary (Last 24 hours) at 04/29/2022 1019 Last data filed at 04/29/2022 0721 Gross per 24 hour  Intake 653 ml  Output --  Net 653 ml        Physical Exam: Vital Signs Blood pressure (!) 155/77, pulse 82, temperature 97.9 F (36.6 C), temperature source Oral, resp. rate 18, height '5\' 6"'$  (1.676 m), weight 67.3 kg, SpO2 99 %.    Constitutional: Not in distress.  Vital signs reviewed. Working with SLP HEENT: NCAT, ED OMI, oral membranes moist. Neck: Supple Cardiovascular: IRR with systolic murmur.  No JVD. Respiratory: CTA bilaterally without wheezes or rails with conversation or activity can have some dyspnea however lungs are clear GI/abdomen: BS positive all 4 quadrants, nontender nondistended Extremities: No clubbing cyanosis or edema Psych: Cooperative and pleasant Skin:Clean dry and intact without signs of breakdown. Neuro: Alert and oriented to person place and situation (reason for hospitalization.  Difficulty remembering that Dr. Naaman Plummer is off this week. Appears to have more appropriate and focused reasoning able to follow commands with improvement in language still has a minimal right central S.  RUE 3-4/5 possibly greater.  RLE 3-3+/5.  Sensation intact  to light touch and pain. Musculoskeletal: Less tenderness of her left shoulder. No leg pain.    Assessment/Plan: 1. Functional deficits which require 3+ hours per day of interdisciplinary therapy in a comprehensive inpatient rehab setting. Physiatrist is providing close team supervision and 24 hour management of active medical problems listed below. Physiatrist and rehab team continue to assess barriers to discharge/monitor patient progress toward functional and medical goals  Care Tool:  Bathing    Body parts bathed by patient: Right arm, Left arm, Chest, Abdomen, Front perineal area, Buttocks, Right upper leg, Left upper leg, Face   Body parts bathed by helper: Buttocks     Bathing assist Assist Level: Minimal Assistance - Patient > 75%     Upper Body Dressing/Undressing Upper body dressing   What is the patient wearing?: Pull over shirt    Upper body assist Assist Level: Moderate Assistance - Patient 50 - 74%    Lower Body Dressing/Undressing Lower body dressing      What is the patient wearing?: Pants, Incontinence brief     Lower body assist Assist for lower body dressing: Maximal Assistance - Patient 25 - 49%     Toileting Toileting    Toileting  assist Assist for toileting: Maximal Assistance - Patient 25 - 49%     Transfers Chair/bed transfer  Transfers assist     Chair/bed transfer assist level: Moderate Assistance - Patient 50 - 74% (stand pivot with RW)     Locomotion Ambulation   Ambulation assist   Ambulation activity did not occur: Safety/medical concerns  Assist level: Moderate Assistance - Patient 50 - 74% Assistive device: Walker-Eva Max distance: 45   Walk 10 feet activity   Assist  Walk 10 feet activity did not occur: Safety/medical concerns  Assist level: Moderate Assistance - Patient - 50 - 74% Assistive device: Walker-Eva   Walk 50 feet activity   Assist Walk 50 feet with 2 turns activity did not occur: Safety/medical  concerns         Walk 150 feet activity   Assist Walk 150 feet activity did not occur: Safety/medical concerns         Walk 10 feet on uneven surface  activity   Assist Walk 10 feet on uneven surfaces activity did not occur: Safety/medical concerns         Wheelchair     Assist Is the patient using a wheelchair?: Yes Type of Wheelchair: Manual    Wheelchair assist level: Minimal Assistance - Patient > 75% Max wheelchair distance: 25    Wheelchair 50 feet with 2 turns activity    Assist        Assist Level: Maximal Assistance - Patient 25 - 49% (Per PT documentation of 20 feet)   Wheelchair 150 feet activity     Assist      Assist Level: Total Assistance - Patient < 25% (Per PT documentation of 20 feet)   Blood pressure (!) 155/77, pulse 82, temperature 97.9 F (36.6 C), temperature source Oral, resp. rate 18, height '5\' 6"'$  (1.676 m), weight 67.3 kg, SpO2 99 %.  Medical Problem List and Plan: 1. Functional deficits secondary to left thalamic intracranial hemorrhage             -patient may shower             -ELOS/Goals: 14-16 days, supervision to min assist  - mental status improved   -minimizing neurosedating meds, maximize sleep  Continue CIR therapies including PT, OT, and SLP   -family now considering SNF given her projected care needs. Family ed 2.  Antithrombotics: -DVT/anticoagulation:  Pharmaceutical: Heparin             -antiplatelet therapy: none 3. Pain: continue Tylenol as needed.    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 Mood/sleep: team providing ego support             -antipsychotic agents: n/a  -family is  providing  emotional support as well  -melatonin dc'ed d/t questionable side effects 5. Neuropsych: This patient is not capable of making decisions on her own behalf. 6. Skin/Wound Care: Routine skin care checks 7. Fluids/Electrolytes/Nutrition:               --encouraging PO  - intake reasonable  -  Boost Breeze to improve protein 8. Hypertension: continue diltiazem, hydralazine, nadolol. Home losartan held  - Holding nadolol prn for hypotension   -  have decreased hydralazine to '10mg'$    6/16 sbp with some increase. Continue to observe    04/29/2022    5:01 AM 04/29/2022    3:35 AM 04/28/2022    7:41 PM  Vitals with BMI  Weight 148 lbs 6 oz    BMI 75.44    Systolic  920 100  Diastolic  77 82  Pulse  82 72    6/18 SBP ranging 118-131, currently taking nadolol tab 80 mg daily and Cardizem CD 120 mg q evening 6/19: increase Lasix to '40mg'$  BID  9: Hyperlipidemia: continue atorvastatin 10: GI prophylaxis: continue pantoprazole 11: pAF: continue diltiazem. No AC until resolution of ICH, outpt follow-up  -HR has been controlled  6/16-appreciate cardiology assistance -w/u yesterday fairly unremarkable except BNP which is elevated -suspect that cardiology will recommend diuresis -need weight today 6/17 weight stable, trending slightly down with Lasix 40 mg given today and yesterday. Cardiology following, additional dose of Lasix 40 mg IV today, may consider 40 mg po on Monday.  Filed Weights   04/27/22 0500 04/28/22 0516 04/29/22 0501  Weight: 66 kg 64.5 kg 67.3 kg  6/18 Daily weight down today at 64.5 kg.  BNP for 6/19, will transition to po Lasix on 6/19  6/19: d/c IV, increase lasix to '40mg'$  BID 12: Hypothyroidism: continue Synthroid 13: Urinary retention: continue Flomax             -now voiding without caths   -wean urecholine to off  14: UTI:  Keflex completed  -6/13- repeat ucx w/ Citrobacter >100k- fosfomycin x 1 on 6/11  15: Acute on chronic kidney disease: Cr still sl elevated  from baseline at admit.   Has not been on any nephrotoxic meds 6/17 Crt still elevated from baseline continue to monitor with weekly labs 6/18 BUN also mildly elevated, encourage adequate po intake. 6/19: repeat creatinine tomorrow since increasing Lasix    Latest Ref Rng & Units 04/29/2022    5:24 AM 04/28/2022    5:47 AM 04/27/2022    5:10 AM  BMP  Glucose 70 - 99 mg/dL 96  94  93   BUN 8 - 23 mg/dL '30  27  23   '$ Creatinine 0.44 - 1.00 mg/dL 1.40  1.42  1.43   Sodium 135 - 145 mmol/L 142  141  138   Potassium 3.5 - 5.1 mmol/L 3.8  3.7  3.8   Chloride 98 - 111 mmol/L 106  104  104   CO2 22 - 32 mmol/L '28  24  26   '$ Calcium 8.9 - 10.3 mg/dL 9.1  9.3  9.0      -intake reasonable  16: Leukocytosis: wbc's normal.   17: Anemia, chronic: no evidence of acute blood loss -hgb stable at 8.6 18: Polymyalgia rheumatica: prednisone 2 mg with lunch 19. Dysphagia:               -advanced to reg/thins per SLP 20. macular degeneration of the right eye, uses preservision at home. - improved  Continue use of artifical tears and Prosight.    LOS: 27 days A FACE TO FACE EVALUATION WAS PERFORMED  Clide Deutscher Mailin Coglianese 04/29/2022, 10:19 AM

## 2022-04-29 NOTE — Progress Notes (Signed)
Occupational Therapy Session Note  Patient Details  Name: [name] MRN: [id number] Date of Birth: 1923-09-27  Today's Date: 04/29/2022 OT Individual Time: 1420-1505 OT Individual Time Calculation (min): 45 min    Short Term Goals: Week 4:  OT Short Term Goal 1 (Week 4): LTG=STG 2/2 ELOS  Skilled Therapeutic Interventions/Progress Updates:    Pt received sitting in w/c and agreeable to OT session. Began session discussing discharge and emotions involving transitioning and pt voiced desire to be more independent before going home. Therapist providing emotional support and encouragement throughout. Pt wheeled into restroom and completed Min A stand pivot and sit to the toilet utilizing grab bars for safety. Voiced on the toilet. Pt completed peri-care with Min A for balance while standing. Sat down to grab her pants and then stood again and able to don with Min A for balance. Transported to the gym for time management. Pt stood with RW with Min A after verbal cues for placement of LLE to stand. Educated pt on looking at her feet before standing to make adjustments. Once reminded, pt able to self-correct. 2 numbered dots placed on the floor and pt instructed to do single toe taps. Duct tape placed on the floor as a visual cue for pt to bring heels back to tape. Completed 2 sets of 10x with Min-Mod A for balance while weight shifting. Pt completed balance/reaching activity standing with RW and reaching for 10x beige thera-putty on the wall placed in various distances. Maintained good standing balance with LUE on RW while reaching with RUE and placing putty in therapists hand. Pt left seated in recliner with chair alarm on, call bell in reach, and all needs met. Son present in the room.  Therapy Documentation Precautions:  Precautions Precautions: Fall Precaution Comments: R hemi Restrictions Weight Bearing Restrictions: No   Therapy/Group: Individual Therapy  Catalina Lunger 04/29/2022,  2:28 PM

## 2022-04-29 NOTE — Plan of Care (Deleted)
  Problem: RH Toilet Transfers Goal: LTG Patient will perform toilet transfers w/assist (OT) Description: LTG: Patient will perform toilet transfers with assist, with/without cues using equipment (OT) Flowsheets (Taken 04/29/2022 0844) LTG: Pt will perform toilet transfers with assistance level of: Moderate Assistance - Patient 50 - 74% Note: Goal downgraded 6/19-ESD   Problem: RH Tub/Shower Transfers Goal: LTG Patient will perform tub/shower transfers w/assist (OT) Description: LTG: Patient will perform tub/shower transfers with assist, with/without cues using equipment (OT) Outcome: Not Applicable Flowsheets (Taken 04/29/2022 0844) LTG: Pt will perform tub/shower stall transfers with assistance level of: (dc goal 6/19-ESD) -- Note: dc goal 6/19-ESD

## 2022-04-29 NOTE — Progress Notes (Addendum)
Physical Therapy Session Note  Patient Details  Name: [name] MRN: [id number] Date of Birth: 1923/11/05  Today's Date: 04/29/2022 PT Individual Time: 1007-1219 PT Individual Time Calculation (min): 72 min   Short Term Goals: Week 1:  PT Short Term Goal 1 (Week 1): pt will roll side to side w/ CGA. PT Short Term Goal 1 - Progress (Week 1): Met PT Short Term Goal 2 (Week 1): Pt will transfer sup/sidelying to sit w/ min A PT Short Term Goal 2 - Progress (Week 1): Met PT Short Term Goal 3 (Week 1): Pt will transfer sit to stand w/ mod A PT Short Term Goal 3 - Progress (Week 1): Met PT Short Term Goal 4 (Week 1): Pt will perform SPT w/ mod A PT Short Term Goal 4 - Progress (Week 1): Met  Skilled Therapeutic Interventions/Progress Updates:    Pt received in recliner and agreeable to therapy.  No complaint of pain. Stand pivot transfer to w/c with mod A for balance and assist with RW management. Pt transported to therapy gym for time management and energy conservation. Per nsg request, therapist took standing weight. Pt stood on scale x 3 times to get accurate measure. Assist to grasp bar and Sit to stand with min A. Weighted noted to be 141.1 lbs, reported to nurse. Pt performed car transfer with mod A for Stand pivot transfer with RW, min a to manage RLE into car. Stand pivot transfer <>mat table with mod A for RW management and cueing. Sit to stand with min A throughout session and cues for technique. Pt performed side stepping along mat table with min A for RW management and foot placement, cueing throughout. Pt then performed a berg balance scale, as documented below. Discussed implications and results. Pt requested to practice slideboard transfers, returned to room and pt performed w/c<>recliner with min A overall. Pt able to recall and teach back steps, but required cueing for trunk and hand placement. Pt remained in w/c at end of session to await pending OT session, all needs in reach.    Therapy Documentation Precautions:  Precautions Precautions: Fall Precaution Comments: R hemi Restrictions Weight Bearing Restrictions: No General:    Balance: Balance Balance Assessed: Yes Standardized Balance Assessment Standardized Balance Assessment: Berg Balance Test Berg Balance Test Sit to Stand: Needs minimal aid to stand or to stabilize Standing Unsupported: Able to stand 2 minutes with supervision Sitting with Back Unsupported but Feet Supported on Floor or Stool: Able to sit safely and securely 2 minutes Stand to Sit: Uses backs of legs against chair to control descent Transfers: Needs one person to assist Standing Unsupported with Eyes Closed: Able to stand 10 seconds with supervision Standing Ubsupported with Feet Together: Able to place feet together independently and stand for 1 minute with supervision From Standing, Reach Forward with Outstretched Arm: Reaches forward but needs supervision From Standing Position, Pick up Object from Floor: Able to pick up shoe, needs supervision From Standing Position, Turn to Look Behind Over each Shoulder: Looks behind one side only/other side shows less weight shift Turn 360 Degrees: Needs assistance while turning Standing Unsupported, Alternately Place Feet on Step/Stool: Able to complete >2 steps/needs minimal assist Standing Unsupported, One Foot in Front: Loses balance while stepping or standing Standing on One Leg: Unable to try or needs assist to prevent fall Total Score: 25     Therapy/Group: Individual Therapy  Mickel Fuchs 04/29/2022, 3:28 PM

## 2022-04-29 NOTE — Plan of Care (Signed)
  Problem: RH Bathing Goal: LTG Patient will bathe all body parts with assist levels (OT) Description: LTG: Patient will bathe all body parts with assist levels (OT) Outcome: Completed/Met   Problem: RH Dressing Goal: LTG Patient will perform upper body dressing (OT) Description: LTG Patient will perform upper body dressing with assist, with/without cues (OT). Outcome: Completed/Met Goal: LTG Patient will perform lower body dressing w/assist (OT) Description: LTG: Patient will perform lower body dressing with assist, with/without cues in positioning using equipment (OT) Outcome: Completed/Met   Problem: RH Toileting Goal: LTG Patient will perform toileting task (3/3 steps) with assistance level (OT) Description: LTG: Patient will perform toileting task (3/3 steps) with assistance level (OT)  Outcome: Completed/Met   Problem: RH Functional Use of Upper Extremity Goal: LTG Patient will use RT/LT upper extremity as a (OT) Description: LTG: Patient will use right/left upper extremity as a stabilizer/gross assist/diminished/nondominant/dominant level with assist, with/without cues during functional activity (OT) Outcome: Completed/Met   Problem: RH Toilet Transfers Goal: LTG Patient will perform toilet transfers w/assist (OT) Description: LTG: Patient will perform toilet transfers with assist, with/without cues using equipment (OT) 04/29/2022 2304 by Valma Cava, OT Outcome: Completed/Met 04/29/2022 1328 by Valma Cava, OT Flowsheets (Taken 04/29/2022 1328) LTG: Pt will perform toilet transfers with assistance level of: Minimal Assistance - Patient > 75%   Problem: RH Tub/Shower Transfers Goal: LTG Patient will perform tub/shower transfers w/assist (OT) Description: LTG: Patient will perform tub/shower transfers with assist, with/without cues using equipment (OT) 04/29/2022 2304 by Valma Cava, OT Outcome: Completed/Met 04/29/2022 1328 by Lindey Renzulli, Daneen Schick, OT Reactivated    Problem: RH Awareness Goal: LTG: Patient will demonstrate awareness during functional activites type of (OT) Description: LTG: Patient will demonstrate awareness during functional activites type of (OT) Outcome: Completed/Met   Problem: RH Furniture Transfers Goal: LTG Patient will perform furniture transfers w/assist (OT/PT) Description: LTG: Patient will perform furniture transfers  with assistance (OT/PT). Outcome: Completed/Met

## 2022-04-29 NOTE — Progress Notes (Signed)
Occupational Therapy Session Note  Patient Details  Name: [name] MRN: [id number] Date of Birth: 1923-05-27  Today's Date: 04/29/2022 OT Individual Time: 1117-1200 OT Individual Time Calculation (min): 43 min    Short Term Goals: Week 4:  OT Short Term Goal 1 (Week 4): LTG=STG 2/2 ELOS  Skilled Therapeutic Interventions/Progress Updates:  Pt greeted seated in recliner  agreeable to OT intervention. Session focus on BADL reeducation, functional mobility, dynamic standing balance and decreasing overall caregiver burden.   Cardiologist enter to assess pt, MD recommended pt use IS. Reiterated education throughout session with pt able to pull 750 mL consistently.  Pt completed stand pivot to w/c with RW and MOD A however improved anterior weight shift this session. Total A transport to sink where pt completed bathing with overall MIN A from sink level, pt sit>stand with MIN A while pt wash anterior periarea, of noted pt became incontinent when washing anterior periarea, total A transport into bathroom where pt completed squat pivot using grab bars with MINA! Pt abel to complete 3/3 toileting tasks with overall MAX A as pt needs assist for clothing mgmt in standing d/t balance deficits but was able to complete anterior hygiene via lateral leans with set- up, MIN A for squat pivot back to w/c. Pt was abel to complete stand pivot transfer from w/c >recliner with Rw and MODA.      pt left seated in recliner with alarm belt activated and all needs within reach.                    Therapy Documentation Precautions:  Precautions Precautions: Fall Precaution Comments: R hemi Restrictions Weight Bearing Restrictions: No  Pain: no pain reported during session     Therapy/Group: Individual Therapy  Corinne Ports [facility] 04/29/2022, 12:25 PM

## 2022-04-30 DIAGNOSIS — M19012 Primary osteoarthritis, left shoulder: Secondary | ICD-10-CM | POA: Diagnosis not present

## 2022-04-30 DIAGNOSIS — I10 Essential (primary) hypertension: Secondary | ICD-10-CM | POA: Diagnosis present

## 2022-04-30 DIAGNOSIS — Z7689 Persons encountering health services in other specified circumstances: Secondary | ICD-10-CM | POA: Diagnosis not present

## 2022-04-30 DIAGNOSIS — I4819 Other persistent atrial fibrillation: Secondary | ICD-10-CM | POA: Diagnosis present

## 2022-04-30 DIAGNOSIS — I619 Nontraumatic intracerebral hemorrhage, unspecified: Secondary | ICD-10-CM | POA: Diagnosis not present

## 2022-04-30 DIAGNOSIS — K5900 Constipation, unspecified: Secondary | ICD-10-CM | POA: Diagnosis not present

## 2022-04-30 DIAGNOSIS — D649 Anemia, unspecified: Secondary | ICD-10-CM

## 2022-04-30 DIAGNOSIS — E46 Unspecified protein-calorie malnutrition: Secondary | ICD-10-CM | POA: Diagnosis not present

## 2022-04-30 DIAGNOSIS — R52 Pain, unspecified: Secondary | ICD-10-CM | POA: Diagnosis not present

## 2022-04-30 DIAGNOSIS — I629 Nontraumatic intracranial hemorrhage, unspecified: Secondary | ICD-10-CM | POA: Diagnosis not present

## 2022-04-30 DIAGNOSIS — I4811 Longstanding persistent atrial fibrillation: Secondary | ICD-10-CM | POA: Diagnosis not present

## 2022-04-30 DIAGNOSIS — I610 Nontraumatic intracerebral hemorrhage in hemisphere, subcortical: Secondary | ICD-10-CM | POA: Diagnosis not present

## 2022-04-30 DIAGNOSIS — H3530 Unspecified macular degeneration: Secondary | ICD-10-CM | POA: Diagnosis not present

## 2022-04-30 DIAGNOSIS — I951 Orthostatic hypotension: Secondary | ICD-10-CM | POA: Diagnosis not present

## 2022-04-30 DIAGNOSIS — G459 Transient cerebral ischemic attack, unspecified: Secondary | ICD-10-CM | POA: Diagnosis not present

## 2022-04-30 DIAGNOSIS — N189 Chronic kidney disease, unspecified: Secondary | ICD-10-CM | POA: Diagnosis not present

## 2022-04-30 DIAGNOSIS — R531 Weakness: Secondary | ICD-10-CM | POA: Diagnosis not present

## 2022-04-30 DIAGNOSIS — I5033 Acute on chronic diastolic (congestive) heart failure: Secondary | ICD-10-CM

## 2022-04-30 DIAGNOSIS — M810 Age-related osteoporosis without current pathological fracture: Secondary | ICD-10-CM | POA: Diagnosis not present

## 2022-04-30 DIAGNOSIS — H40051 Ocular hypertension, right eye: Secondary | ICD-10-CM | POA: Diagnosis not present

## 2022-04-30 DIAGNOSIS — S42212D Unspecified displaced fracture of surgical neck of left humerus, subsequent encounter for fracture with routine healing: Secondary | ICD-10-CM | POA: Diagnosis not present

## 2022-04-30 DIAGNOSIS — I5032 Chronic diastolic (congestive) heart failure: Secondary | ICD-10-CM | POA: Diagnosis not present

## 2022-04-30 DIAGNOSIS — M353 Polymyalgia rheumatica: Secondary | ICD-10-CM | POA: Diagnosis not present

## 2022-04-30 DIAGNOSIS — I959 Hypotension, unspecified: Secondary | ICD-10-CM | POA: Diagnosis not present

## 2022-04-30 DIAGNOSIS — I482 Chronic atrial fibrillation, unspecified: Secondary | ICD-10-CM | POA: Diagnosis not present

## 2022-04-30 DIAGNOSIS — Z7401 Bed confinement status: Secondary | ICD-10-CM | POA: Diagnosis not present

## 2022-04-30 DIAGNOSIS — I69191 Dysphagia following nontraumatic intracerebral hemorrhage: Secondary | ICD-10-CM | POA: Diagnosis not present

## 2022-04-30 DIAGNOSIS — H34811 Central retinal vein occlusion, right eye, with macular edema: Secondary | ICD-10-CM | POA: Diagnosis not present

## 2022-04-30 DIAGNOSIS — S51811A Laceration without foreign body of right forearm, initial encounter: Secondary | ICD-10-CM | POA: Diagnosis not present

## 2022-04-30 DIAGNOSIS — R339 Retention of urine, unspecified: Secondary | ICD-10-CM | POA: Diagnosis not present

## 2022-04-30 DIAGNOSIS — S81812A Laceration without foreign body, left lower leg, initial encounter: Secondary | ICD-10-CM | POA: Diagnosis not present

## 2022-04-30 DIAGNOSIS — N390 Urinary tract infection, site not specified: Secondary | ICD-10-CM | POA: Diagnosis not present

## 2022-04-30 MED ORDER — NITROGLYCERIN 0.4 MG SL SUBL: 0.4000 mg | SUBLINGUAL_TABLET | SUBLINGUAL | 12 refills | Status: AC | PRN

## 2022-04-30 MED ORDER — FUROSEMIDE 40 MG PO TABS
40.0000 mg | ORAL_TABLET | Freq: Every day | ORAL | Status: DC
Start: 2022-05-01 — End: 2022-04-30

## 2022-04-30 MED ORDER — PANTOPRAZOLE SODIUM 40 MG PO TBEC: 40.0000 mg | DELAYED_RELEASE_TABLET | Freq: Every day | ORAL | 1 refills | Status: AC

## 2022-04-30 MED ORDER — FUROSEMIDE 40 MG PO TABS: 40.0000 mg | ORAL_TABLET | Freq: Two times a day (BID) | ORAL | Status: DC

## 2022-04-30 MED ORDER — POLYVINYL ALCOHOL 1.4 % OP SOLN: 1.0000 [drp] | OPHTHALMIC | 0 refills | Status: AC | PRN

## 2022-04-30 MED ORDER — PREDNISONE 1 MG PO TABS: 2.0000 mg | ORAL_TABLET | Freq: Every day | ORAL | Status: AC

## 2022-04-30 MED ORDER — ACETAMINOPHEN 325 MG PO TABS: 325.0000 mg | ORAL_TABLET | ORAL | Status: DC | PRN

## 2022-04-30 MED ORDER — LEVOTHYROXINE SODIUM 50 MCG PO TABS: 50.0000 ug | ORAL_TABLET | Freq: Every day | ORAL | Status: AC

## 2022-04-30 MED ORDER — DICLOFENAC SODIUM 1 % EX GEL: 2.0000 g | Freq: Three times a day (TID) | CUTANEOUS | Status: DC

## 2022-04-30 MED ORDER — FUROSEMIDE 40 MG PO TABS: 40.0000 mg | ORAL_TABLET | Freq: Every day | ORAL | Status: DC

## 2022-04-30 MED ORDER — WITCH HAZEL-GLYCERIN EX PADS: MEDICATED_PAD | CUTANEOUS | 12 refills | Status: DC | PRN

## 2022-04-30 MED ORDER — NADOLOL 80 MG PO TABS: 80.0000 mg | ORAL_TABLET | Freq: Every day | ORAL | Status: AC

## 2022-04-30 MED ORDER — DILTIAZEM HCL ER COATED BEADS 120 MG PO CP24: 120.0000 mg | ORAL_CAPSULE | Freq: Every evening | ORAL | Status: AC

## 2022-04-30 MED ORDER — HYDROCORTISONE 1 % EX CREA: TOPICAL_CREAM | Freq: Three times a day (TID) | CUTANEOUS | 0 refills | Status: DC | PRN

## 2022-04-30 MED ORDER — ALUM & MAG HYDROXIDE-SIMETH 200-200-20 MG/5ML PO SUSP: 30.0000 mL | ORAL | 0 refills | Status: DC | PRN

## 2022-04-30 MED ORDER — GABAPENTIN 100 MG PO CAPS: 200.0000 mg | ORAL_CAPSULE | Freq: Every day | ORAL | Status: AC

## 2022-04-30 MED ORDER — BISACODYL 5 MG PO TBEC: 5.0000 mg | DELAYED_RELEASE_TABLET | Freq: Every day | ORAL | 0 refills | Status: DC | PRN

## 2022-04-30 NOTE — Progress Notes (Signed)
Patient is being transferred to [facility] today. Patient denies pain or discomfort at this time.Patient has no questions or concerns at this time.

## 2022-04-30 NOTE — Progress Notes (Signed)
PROGRESS NOTE   Subjective/Complaints:  No new concerns today. She is discharging to SNF. No additional complaints. She had a few general questions about CVA recovery.    ROS: Patient denies fever, chills, sore throat ,rash, blurred vision, dizziness, nausea, vomiting, diarrhea, cough, chest pain, joint/back/pain/neck pain, headache, or mood change, shortness of breath, dysuria   Objective:   No results found.  Recent Labs    04/29/22 0524  WBC 7.7  HGB 9.0*  HCT 27.6*  PLT 221        Recent Labs    04/28/22 0547 04/29/22 0524  NA 141 142  K 3.7 3.8  CL 104 106  CO2 24 28  GLUCOSE 94 96  BUN 27* 30*  CREATININE 1.42* 1.40*  CALCIUM 9.3 9.1         Intake/Output Summary (Last 24 hours) at 04/30/2022 0906 Last data filed at 04/30/2022 0700 Gross per 24 hour  Intake 713 ml  Output --  Net 713 ml         Physical Exam: Vital Signs Blood pressure 127/68, pulse 64, temperature 98 F (36.7 C), temperature source Oral, resp. rate 17, height '5\' 6"'$  (1.676 m), weight 64.1 kg, SpO2 93 %.    Constitutional: Not in distress.  Vital signs reviewed. Sitting in bed HEENT: NCAT, ED OMI, oral membranes moist. Neck: Supple Cardiovascular: IRR with systolic murmur.  No JVD. Respiratory: CTA bilaterally without wheezes or rails with conversation or activity can have some dyspnea however lungs are clear GI/abdomen: BS positive all 4 quadrants, nontender nondistended Extremities: No clubbing cyanosis or edema Psych: Cooperative and pleasant Skin:warm and dry Neuro: Alert and oriented to person place and situation (reason for hospitalization.  Difficulty remembering that Dr. Naaman Plummer is off this week. Appears to have more appropriate and focused reasoning able to follow commands with improvement in language still has a minimal right central S.  RUE 3-4/5 possibly greater.  RLE 3-3+/5.  Sensation intact to light touch  and pain. Musculoskeletal: Less tenderness of her left shoulder. No leg pain.    Assessment/Plan: 1. Functional deficits which require 3+ hours per day of interdisciplinary therapy in a comprehensive inpatient rehab setting. Physiatrist is providing close team supervision and 24 hour management of active medical problems listed below. Physiatrist and rehab team continue to assess barriers to discharge/monitor patient progress toward functional and medical goals  Care Tool:  Bathing    Body parts bathed by patient: Right arm, Left arm, Chest, Abdomen, Front perineal area, Buttocks, Right upper leg, Left upper leg, Face, Right lower leg, Left lower leg   Body parts bathed by helper: Buttocks     Bathing assist Assist Level: Minimal Assistance - Patient > 75%     Upper Body Dressing/Undressing Upper body dressing   What is the patient wearing?: Pull over shirt    Upper body assist Assist Level: Supervision/Verbal cueing    Lower Body Dressing/Undressing Lower body dressing      What is the patient wearing?: Pants     Lower body assist Assist for lower body dressing: Minimal Assistance - Patient > 75%     Toileting Toileting    Toileting assist Assist  for toileting: Minimal Assistance - Patient > 75%     Transfers Chair/bed transfer  Transfers assist     Chair/bed transfer assist level: Moderate Assistance - Patient 50 - 74%     Locomotion Ambulation   Ambulation assist   Ambulation activity did not occur: Safety/medical concerns  Assist level: Moderate Assistance - Patient 50 - 74% Assistive device: Walker-Eva Max distance: 45   Walk 10 feet activity   Assist  Walk 10 feet activity did not occur: Safety/medical concerns  Assist level: Moderate Assistance - Patient - 50 - 74% Assistive device: Walker-Eva   Walk 50 feet activity   Assist Walk 50 feet with 2 turns activity did not occur: Safety/medical concerns         Walk 150 feet  activity   Assist Walk 150 feet activity did not occur: Safety/medical concerns         Walk 10 feet on uneven surface  activity   Assist Walk 10 feet on uneven surfaces activity did not occur: Safety/medical concerns         Wheelchair     Assist Is the patient using a wheelchair?: Yes Type of Wheelchair: Manual    Wheelchair assist level: Minimal Assistance - Patient > 75% Max wheelchair distance: 25    Wheelchair 50 feet with 2 turns activity    Assist        Assist Level: Maximal Assistance - Patient 25 - 49% (Per PT documentation of 20 feet)   Wheelchair 150 feet activity     Assist      Assist Level: Total Assistance - Patient < 25% (Per PT documentation of 20 feet)   Blood pressure 127/68, pulse 64, temperature 98 F (36.7 C), temperature source Oral, resp. rate 17, height '5\' 6"'$  (1.676 m), weight 64.1 kg, SpO2 93 %.  Medical Problem List and Plan: 1. Functional deficits secondary to left thalamic intracranial hemorrhage             -patient may shower             -ELOS/Goals: 14-16 days, supervision to min assist  - mental status improved   -minimizing neurosedating meds, maximize sleep  Continue CIR therapies including PT, OT, and SLP   -DC today 2.  Antithrombotics: -DVT/anticoagulation:  Pharmaceutical: Heparin             -antiplatelet therapy: none 3. Pain: continue Tylenol as needed.    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 Mood/sleep: team providing ego support             -antipsychotic agents: n/a  -family is providing  emotional support as well  -melatonin dc'ed d/t questionable side effects 5. Neuropsych:  This patient is not capable of making decisions on her own behalf. 6. Skin/Wound Care: Routine skin care checks 7. Fluids/Electrolytes/Nutrition:               --encouraging PO  - intake reasonable  -  Boost Breeze to improve protein 8. Hypertension: continue diltiazem, hydralazine, nadolol. Home losartan held  - Holding nadolol prn for hypotension   -have decreased hydralazine to '10mg'$    6/16 sbp with some increase. Continue to observe  04/30/2022    4:14 AM 04/29/2022    7:37 PM 04/29/2022    4:24 PM  Vitals with BMI  Systolic 390 300 923  Diastolic 68 65 54  Pulse 64 93 70    6/18 SBP ranging 118-131, currently taking nadolol tab 80 mg daily and Cardizem CD 120 mg q evening 6/19: increase Lasix to '40mg'$  BID 6/20 cardiology called today, they plan to review lasix dose per last note  9: Hyperlipidemia: continue atorvastatin 10: GI prophylaxis: continue pantoprazole 11: pAF: continue diltiazem. No AC until resolution of ICH, outpt follow-up  -HR has been controlled  6/16-appreciate cardiology assistance -w/u yesterday fairly unremarkable except BNP which is elevated -suspect that cardiology will recommend diuresis -need weight today 6/17 weight stable, trending slightly down with Lasix 40 mg given today and yesterday. Cardiology following, additional dose of Lasix 40 mg IV today, may consider 40 mg po on Monday.  Filed Weights   04/28/22 0516 04/29/22 0501 04/29/22 1321  Weight: 64.5 kg 67.3 kg 64.1 kg   6/18 Daily weight down today at 64.5 kg.  BNP for 6/19, will transition to po Lasix on 6/19  6/19: d/c IV, increase lasix to '40mg'$  BID 6/20 cardiology plans to review today, possibly lasix will be decreased to '20mg'$  BID 12: Hypothyroidism: continue Synthroid 13: Urinary retention: continue Flomax             -now voiding without caths   -wean urecholine to off  14: UTI:  Keflex completed  -6/13- repeat ucx w/ Citrobacter >100k- fosfomycin x 1 on 6/11  15: Acute on chronic  kidney disease: Cr still sl elevated from baseline at admit.   Has not been on any nephrotoxic meds 6/17 Crt still elevated from baseline continue to monitor with weekly labs 6/18 BUN also mildly elevated, encourage adequate po intake. 6/19: repeat creatinine tomorrow since increasing Lasix 6/20 Follow BMP with SNF, cardiology plans to review lasix dose    Latest Ref Rng & Units 04/29/2022    5:24 AM 04/28/2022    5:47 AM 04/27/2022    5:10 AM  BMP  Glucose 70 - 99 mg/dL 96  94  93   BUN 8 - 23 mg/dL '30  27  23   '$ Creatinine 0.44 - 1.00 mg/dL 1.40  1.42  1.43   Sodium 135 - 145 mmol/L 142  141  138   Potassium 3.5 - 5.1 mmol/L 3.8  3.7  3.8   Chloride 98 - 111 mmol/L 106  104  104   CO2 22 - 32 mmol/L '28  24  26   '$ Calcium 8.9 - 10.3 mg/dL 9.1  9.3  9.0      -intake reasonable  16: Leukocytosis: wbc's normal.   17: Anemia, chronic: no evidence of acute blood loss -hgb stable at 9.0 18: Polymyalgia rheumatica: prednisone 2 mg with lunch 19. Dysphagia:               -advanced to reg/thins per SLP 20. macular degeneration of the right eye, uses preservision at home. - improved  Continue use of artifical tears and Prosight.    LOS: 28 days A FACE TO FACE EVALUATION WAS PERFORMED  [name] 04/30/2022, 9:06 AM

## 2022-04-30 NOTE — Progress Notes (Signed)
Speech Language Pathology Discharge Summary  Patient Details  Name: [name] MRN: [id number] Date of Birth: 11/11/1923   Patient has met 8 of 8 long term goals.  Patient to discharge at overall Supervision level.   Reasons goals not met: N/A   Clinical Impression/Discharge Summary: Patient has made functional gains and has met 8 of 8 LTGs this admission. Currently, patient is consuming regular textures with thin liquids with minimal overt s/s of aspiration and is overall Mod I for use of swallowing compensatory strategies. Patient demonstrates improved verbal expression at the sentence level and requires overall supervision level verbal cues for use of word-finding strategies. Patient is also overall 100% intelligible at the sentence level with intermittent verbal cues needed for diaphragmatic breathing and overall breath support. Patient's overall cognitive functioning has also improved with patient requiring overall supervision level verbal cues to complete functional and familiar tasks safely in regards to functional problem solving, recall with use of strategies, and anticipatory awareness. Patient and family education is complete and patient's family is unable to provide the necessary level of assistance needed at this time, therefore, patient will discharge to a SNF. Patient would benefit from f/u SLP services to maximize her cognitive-linguistic functioning and overall functional independence in order to reduce caregiver burden..   Care Partner:  Caregiver Able to Provide Assistance: No  Type of Caregiver Assistance: Physical;Cognitive  Recommendation:  Skilled Nursing facility  Rationale for SLP Follow Up: Maximize cognitive function and independence;Maximize functional communication;Reduce caregiver burden   Equipment: N/A   Reasons for discharge: Discharged from hospital;Treatment goals met   Patient/Family Agrees with Progress Made and Goals Achieved: Yes    Colbey Wirtanen,  Yomira Flitton 04/30/2022, 6:34 AM

## 2022-04-30 NOTE — Progress Notes (Signed)
Patient ID: [name], female   DOB: Sep 09, 1923, 88 y.o.   MRN: [id number]  Pt was placed on hold for discharge pending medication recommendations via cardiology. SW informed pt and family, and Kelly/Admissions with [facility]. SW rescheduled transportation services with [facility] to change pick up time to 3:30pm.   *Pt cleared by attending to discharge.   D/c packet left a front desk.   Loralee Pacas, MSW, [place] Office: [phone] Cell: [phone] Fax: [phone]

## 2022-04-30 NOTE — Progress Notes (Signed)
Report called to Nurse at [facility]. Nurse states that she leaves at 4:30, patient is to be picked up at 3:30. Writer gave nurse telephone number to [facility] oncoming nurse has additional questions.

## 2022-04-30 NOTE — Progress Notes (Addendum)
Progress Note  Patient Name: [name] Date of Encounter: 04/30/2022  Primary Cardiologist: Virl Axe, MD   Subjective   Pt breathing well. Was able to stand on scales yesterday. Going to a rehab facility today by EMS, [facility]. Pt rm #[id number] and nurse report [phone]. Has f/u appt  Inpatient Medications    Scheduled Meds:  acetaminophen  650 mg Oral Daily   atorvastatin  20 mg Oral Daily   diclofenac Sodium  2 g Topical TID   diltiazem  120 mg Oral QPM   feeding supplement  1 Container Oral TID BM   furosemide  40 mg Oral BID   gabapentin  200 mg Oral QHS   heparin injection (subcutaneous)  5,000 Units Subcutaneous Q8H   levothyroxine  50 mcg Oral Daily   multivitamin  1 tablet Oral Daily   nadolol  80 mg Oral Daily   pantoprazole  40 mg Oral Daily   predniSONE  2 mg Oral Q lunch   tamsulosin  0.4 mg Oral Daily   Continuous Infusions:  PRN Meds: acetaminophen, alum & mag hydroxide-simeth, bisacodyl, guaiFENesin-dextromethorphan, hydrocortisone cream, nitroGLYCERIN, mouth rinse, polyvinyl alcohol, prochlorperazine **OR** prochlorperazine **OR** prochlorperazine, senna-docusate, sodium phosphate, witch hazel-glycerin   Vital Signs    Vitals:   04/29/22 1321 04/29/22 1624 04/29/22 1937 04/30/22 0414  BP:  (!) 119/54 125/65 127/68  Pulse:  70 93 64  Resp:  '17 16 17  '$ Temp:  98.3 F (36.8 C) 98 F (36.7 C) 98 F (36.7 C)  TempSrc:    Oral  SpO2:  96% 96% 93%  Weight: 64.1 kg     Height:        Intake/Output Summary (Last 24 hours) at 04/30/2022 1006 Last data filed at 04/30/2022 0700 Gross per 24 hour  Intake 713 ml  Output --  Net 713 ml   Filed Weights   04/28/22 0516 04/29/22 0501 04/29/22 1321  Weight: 64.5 kg 67.3 kg 64.1 kg    Telemetry    NA   Physical Exam   GEN: No acute distress.   Neck: JVD 10 cm Cardiac: Irreg R&R, soft murmur, no rubs, or gallops.  Respiratory:  rales in the bases. GI: Soft, nontender, non-distended   MS: No edema; No deformity. Neuro:  Nonfocal, oriented Psych: Normal affect   Labs    Chemistry Recent Labs  Lab 04/25/22 1446 04/27/22 0510 04/28/22 0547 04/29/22 0524  NA 140 138 141 142  K 4.5 3.8 3.7 3.8  CL 107 104 104 106  CO2 '24 26 24 28  '$ GLUCOSE 191* 93 94 96  BUN 20 23 27* 30*  CREATININE 1.47* 1.43* 1.42* 1.40*  CALCIUM 9.6 9.0 9.3 9.1  PROT 5.9*  --   --   --   ALBUMIN 3.4*  --   --   --   AST 15  --   --   --   ALT 17  --   --   --   ALKPHOS 71  --   --   --   BILITOT 0.5  --   --   --   GFRNONAA 32* 33* 34* 34*  ANIONGAP '9 8 13 8     '$ Hematology Recent Labs  Lab 04/25/22 1446 04/29/22 0524  WBC 7.7 7.7  RBC 3.43* 3.06*  HGB 9.9* 9.0*  HCT 31.3* 27.6*  MCV 91.3 90.2  MCH 28.9 29.4  MCHC 31.6 32.6  RDW 15.9* 15.7*  PLT 266 221  Cardiac EnzymesNo results for input(s): "TROPONINI" in the last 168 hours. No results for input(s): "TROPIPOC" in the last 168 hours.   BNP Recent Labs  Lab 04/25/22 1446  BNP 416.0*     DDimer No results for input(s): "DDIMER" in the last 168 hours.   Radiology    No results found.  Patient Profile     88 y.o. female history of HFpEF and AF with new SOB while in recovery from [facility]    Dyspnea  Acute on chronic diastolic HF HTN CKD stage IIIb - not on Lasix, believe will be needed at d/c -  BUN/Cr higher than in May, but stable - however, still w/ some overload on exam - not on diuretic pta, discuss w/ MD  Persistent atrial fibrillation - No AC in the setting of recent ICH - will continue present BB and CCB    For questions or updates, please contact [facility] Please consult [email or web address] for contact info under Cardiology/STEMI.   Rosaria Ferries, PA-C 04/30/2022 10:06 AM  History and all data above reviewed.  Patient examined.  I agree with the findings as above.    No SOB.  Ready for transfer to out patient rehab.  The patient exam reveals GUR:[id number]   ,   Lungs: Clear  ,  Abd:  Positive bowel sounds, no rebound no guarding , Ext No edema  .  All available labs, radiology testing, previous records reviewed. Agree with documented assessment and plan.   Acute on chronic diastoic HF:  Discussed at length with the family, I will send her home on Lasix 40 mg daily.  She can have a BMET when she returns to see Dr. Caryl Comes next week.  Atrial fib:  No Eliquis at this time and this can be revisited at the time of her follow up appt.    Jeneen Rinks Renuka Farfan  11:09 AM  04/30/2022

## 2022-04-30 NOTE — Progress Notes (Signed)
Physical Therapy Discharge Summary  Patient Details  Name: [name] MRN: [id number] Date of Birth: [birth year]   Patient has met 2 of 11 long term goals due to improved activity tolerance, improved balance, improved postural control, increased strength, improved attention, and improved awareness.  Patient to discharge at an ambulatory level Mod Assist. Pt's family attended family education and determined, per therapy recommendation, that pt would be best suited to discharge to SNF at this time due to mobility needs and ongoing impairments.  Reasons goals not met: Pt still requires primarily modA for OOB mobility secondary to poor attention to the R and pushing with L lower extremity, causing pt to lose balance frequently, especially with fatigue.   Recommendation:  Patient will benefit from ongoing skilled PT services in skilled nursing facility setting to continue to advance safe functional mobility, address ongoing impairments in balance, strength, transfers, ambulation, and minimize fall risk.  Equipment: No equipment provided  Reasons for discharge: discharge from hospital  Patient/family agrees with progress made and goals achieved: Yes  PT Discharge Precautions/Restrictions Precautions Precautions: Fall Precaution Comments: R hemi Restrictions Weight Bearing Restrictions: No Pain Interference Pain Interference Pain Effect on Sleep: 1. Rarely or not at all Pain Interference with Therapy Activities: 1. Rarely or not at all Pain Interference with Day-to-Day Activities: 1. Rarely or not at all Vision/Perception  Vision - History Ability to See in Adequate Light: 2 Moderately impaired Perception Perception: Impaired Inattention/Neglect: Does not attend to right visual field (much improved since eval) Praxis Praxis: Impaired Praxis Impairment Details: Motor planning Praxis-Other Comments: Much improved since eval  Cognition Overall Cognitive Status: Impaired/Different  from baseline Arousal/Alertness: Awake/alert Orientation Level: Oriented X4 Selective Attention: Appears intact Memory: Impaired Memory Impairment: Decreased short term memory;Decreased recall of new information Awareness: Impaired Awareness Impairment: Anticipatory impairment Problem Solving: Impaired Problem Solving Impairment: Functional complex Safety/Judgment: Appears intact Sensation Sensation Light Touch: Appears Intact Hot/Cold: Appears Intact Proprioception: Impaired Detail Proprioception Impaired Details: Impaired RLE;Impaired RUE Coordination Gross Motor Movements are Fluid and Coordinated: No Fine Motor Movements are Fluid and Coordinated: No Coordination and Movement Description: R hemi, decreased smoothness and accuracy in R UE, but much improved since eval Motor  Motor Motor: Hemiplegia Motor - Skilled Clinical Observations: R hemi Motor - Discharge Observations: R hemi, much improved since eval  Mobility Bed Mobility Bed Mobility: Sit to Supine;Supine to Sit Supine to Sit: Supervision/Verbal cueing Sit to Supine: Supervision/Verbal cueing Transfers Transfers: Sit to Stand;Stand to Sit;Stand Pivot Transfers Sit to Stand: Minimal Assistance - Patient > 75% Stand to Sit: Minimal Assistance - Patient > 75% Stand Pivot Transfers: Minimal Assistance - Patient > 75%;Moderate Assistance - Patient 50 - 74% Stand Pivot Transfer Details: Manual facilitation for weight shifting;Verbal cues for sequencing Transfer (Assistive device): None Locomotion  Gait Ambulation: Yes Gait Assistance: Moderate Assistance - Patient 50-74% Gait Distance (Feet): 75 Feet Assistive device: Rolling walker Gait Assistance Details: Tactile cues for initiation;Tactile cues for posture;Verbal cues for sequencing;Verbal cues for gait pattern;Verbal cues for safe use of DME/AE;Verbal cues for precautions/safety;Tactile cues for weight beaing Gait Gait: Yes Gait Pattern: Impaired Gait  Pattern: Ataxic (R hemi gait) Stairs / Additional Locomotion Stairs: No Wheelchair Mobility Wheelchair Mobility: No  Trunk/Postural Assessment  Cervical Assessment Cervical Assessment: Within Functional Limits Thoracic Assessment Thoracic Assessment: Exceptions to [facility] (rounded shoulders) Lumbar Assessment Lumbar Assessment: Exceptions to [facility] (posterior pelvic tilt) Postural Control Postural Control: Deficits on evaluation Righting Reactions: delayed and inadequate  Balance Balance Balance Assessed: Yes Static  Sitting Balance Static Sitting - Balance Support: Feet supported Static Sitting - Level of Assistance: 5: Stand by assistance Dynamic Sitting Balance Dynamic Sitting - Balance Support: Feet supported Dynamic Sitting - Level of Assistance: 5: Stand by assistance Static Standing Balance Static Standing - Balance Support: During functional activity;Bilateral upper extremity supported Static Standing - Level of Assistance: 4: Min assist Dynamic Standing Balance Dynamic Standing - Balance Support: During functional activity;Bilateral upper extremity supported Dynamic Standing - Level of Assistance: 4: Min assist Extremity Assessment      RLE Assessment RLE Assessment: Exceptions to [facility] General Strength Comments: Grossly 4/5 LLE Assessment LLE Assessment: Exceptions to [facility] General Strength Comments: Grossly 4+/5    Breck Coons 04/30/2022, 6:07 PM

## 2022-05-01 NOTE — Progress Notes (Signed)
Inpatient Rehabilitation Care Coordinator Discharge Note   Patient Details  Name: [name] MRN: [id number] Date of Birth: 12/28/1922   Discharge location: D/c to [facility]  Length of Stay: 27 days  Discharge activity level: Mod Asst  Home/community participation: Limited  Patient response KA:JGOTLX Literacy - How often do you need to have someone help you when you read instructions, pamphlets, or other written material from your doctor or pharmacy?: Rarely  Patient response BW:IOMBTD Isolation - How often do you feel lonely or isolated from those around you?: Never  Services provided included: MD, RD, PT, OT, RN, CM, SLP, TR, Pharmacy, Neuropsych, SW  Financial Services:  [place] Medicare  Choices offered to/list presented to: Yes  Follow-up services arranged:     Patient response to transportation need: Is the patient able to respond to transportation needs?: Yes In the past 12 months, has lack of transportation kept you from medical appointments or from getting medications?: No In the past 12 months, has lack of transportation kept you from meetings, work, or from getting things needed for daily living?: No    Comments (or additional information):  Patient/Family verbalized understanding of follow-up arrangements:  Yes  Individual responsible for coordination of the follow-up plan: contact pt dtr Jana Half  Confirmed correct DME delivered: Rana Snare 05/01/2022    Rana Snare

## 2022-05-07 ENCOUNTER — Encounter: Payer: Self-pay | Admitting: Internal Medicine

## 2022-05-07 ENCOUNTER — Ambulatory Visit: Payer: Medicare PPO | Admitting: Internal Medicine

## 2022-05-07 VITALS — BP 120/66 | HR 58 | Ht 66.0 in | Wt 143.0 lb

## 2022-05-07 DIAGNOSIS — I4811 Longstanding persistent atrial fibrillation: Secondary | ICD-10-CM | POA: Diagnosis not present

## 2022-05-07 DIAGNOSIS — I10 Essential (primary) hypertension: Secondary | ICD-10-CM

## 2022-05-07 DIAGNOSIS — I629 Nontraumatic intracranial hemorrhage, unspecified: Secondary | ICD-10-CM | POA: Diagnosis not present

## 2022-05-07 DIAGNOSIS — I5032 Chronic diastolic (congestive) heart failure: Secondary | ICD-10-CM | POA: Diagnosis not present

## 2022-05-07 DIAGNOSIS — I4819 Other persistent atrial fibrillation: Secondary | ICD-10-CM

## 2022-05-07 DIAGNOSIS — I5033 Acute on chronic diastolic (congestive) heart failure: Secondary | ICD-10-CM | POA: Diagnosis not present

## 2022-05-15 DIAGNOSIS — Z7689 Persons encountering health services in other specified circumstances: Secondary | ICD-10-CM | POA: Diagnosis not present

## 2022-05-15 DIAGNOSIS — H3530 Unspecified macular degeneration: Secondary | ICD-10-CM | POA: Diagnosis not present

## 2022-05-15 DIAGNOSIS — S51811A Laceration without foreign body of right forearm, initial encounter: Secondary | ICD-10-CM | POA: Diagnosis not present

## 2022-05-15 DIAGNOSIS — N390 Urinary tract infection, site not specified: Secondary | ICD-10-CM | POA: Diagnosis not present

## 2022-05-15 DIAGNOSIS — R52 Pain, unspecified: Secondary | ICD-10-CM | POA: Diagnosis not present

## 2022-05-15 DIAGNOSIS — S81812A Laceration without foreign body, left lower leg, initial encounter: Secondary | ICD-10-CM | POA: Diagnosis not present

## 2022-05-17 ENCOUNTER — Encounter: Payer: Self-pay | Admitting: Registered Nurse

## 2022-05-17 ENCOUNTER — Telehealth: Payer: Self-pay

## 2022-05-17 ENCOUNTER — Encounter: Payer: Medicare PPO | Attending: Registered Nurse | Admitting: Registered Nurse

## 2022-05-17 VITALS — BP 125/69 | HR 69 | Ht 66.0 in

## 2022-05-17 DIAGNOSIS — I4819 Other persistent atrial fibrillation: Secondary | ICD-10-CM | POA: Insufficient documentation

## 2022-05-17 DIAGNOSIS — I10 Essential (primary) hypertension: Secondary | ICD-10-CM | POA: Insufficient documentation

## 2022-05-17 DIAGNOSIS — I610 Nontraumatic intracerebral hemorrhage in hemisphere, subcortical: Secondary | ICD-10-CM

## 2022-05-17 NOTE — Progress Notes (Unsigned)
Subjective:    Patient ID: [name], female    DOB: 08-14-1923, 88 y.o.   MRN: [id number]  HPI: [name] is a 88 y.o. female who is here to follow up on her  [place] ( Intracerebral Hemorrhage), Persistent Atrial Fibrillation and Essential Hypertension. She presented to the emergency room on 03/25/2022, with right sided weakness, aphasia and speech apraxia.  Dr. Lorrin Goodell H&P on 03/25/2022 [name] is a 88 y.o. female with PMH significant for DVT, HTN, Hypothyroidism, GERD, peripheral vascular disease, Afibb on Eliquis who presents with R sided weakness, aphasia/speech apraxia and slurred speech.   She was last seen by family at 2030 on 03/24/2022 and then pushed the button on her life alert.  Family lives 5 minutes away and went over to find her weak on the right side and immediately called EMS.  She was brought in as a code stroke for right-sided weakness, facial droop, slurred speech and expressive aphasia/speech apraxia.  Daughter confirmed the patient did take her Eliquis last night.  CT Head WO Contrast IMPRESSION: 1. 2.2 x 2.2 x 2.6 cm acute intraparenchymal hemorrhage centered at the left thalamus, estimated volume 6 mL. Mild localized edema without significant regional mass effect or midline shift. 2. Associated intraventricular extension with trace IVH within both lateral ventricles. No hydrocephalus or trapping. 3. Underlying atrophy with chronic small vessel ischemic disease.   MRI/MRA: IMPRESSION: Motion artifact present.   Evolving recent left thalamic hemorrhage with intraventricular extension and mild edema and mass effect. No evidence of underlying lesion.   No significant vascular abnormality.   Chronic microvascular ischemic changes.  [name] has a history of atrial fibrillation she was on Eliquis pre hospitalization.    [name] was admitted to inpatient rehabilitation on 04/02/2022 and discharged home on 04/30/2022. She was [facility] to  [facility]. She is receiving therapy at the SNF. She denies any pain at this time. She rated her pain 6 on Health and History Form. Also reports her appetite is fair.  [name] reports she has right eye pain due to Macular degeneration, she will follow up with Dr Stacie Glaze. Daughter and son was encouraged to call Dr Stacie Glaze.  Her children had many questions a letter was written to the SNF, awaiting a return call, they verbalizes understanding.   [name] states she walks with the physical therapist only and they have another worker who walks behind her with wheelchair. For the rest of the day she is in wheelchair or chair in her room.     Pain Inventory Average Pain 6 Pain Right Now 6 My pain is constant, aching, and shooting  LOCATION OF PAIN  right eye  BOWEL Number of stools per week: 7   BLADDER Normal Bladder incontinence No  Frequent urination Yes  Leakage with coughing No  Difficulty starting stream No  Incomplete bladder emptying No    Mobility walk with assistance use a wheelchair needs help with transfers Do you have any goals in this area?  yes  Function retired I need assistance with the following:  dressing, bathing, toileting, meal prep, household duties, and shopping  Neuro/Psych weakness numbness tingling trouble walking dizziness  Prior Studies Any changes since last visit?  no  Physicians involved in your care Any changes since last visit?  no   Family History  Problem Relation Age of Onset   Cancer Mother        BREAST   COPD Father  Emphysema Father    Cancer Daughter        breast   Social History   Socioeconomic History   Marital status: Widowed    Spouse name: Not on file   Number of children: Not on file   Years of education: Not on file   Highest education level: Not on file  Occupational History   Occupation: retired  Tobacco Use   Smoking status: Never   Smokeless tobacco: Never  Vaping Use   Vaping Use: Never  used  Substance and Sexual Activity   Alcohol use: No   Drug use: No   Sexual activity: Never  Other Topics Concern   Not on file  Social History Narrative   Lots of family nearby   She has an aide 4 days per week   Social Determinants of Health   Financial Resource Strain: Low Risk  (12/27/2021)   Overall Financial Resource Strain (CARDIA)    Difficulty of Paying Living Expenses: Not hard at all  Food Insecurity: No Food Insecurity (12/27/2021)   Hunger Vital Sign    Worried About Running Out of Food in the Last Year: Never true    Short in the Last Year: Never true  Transportation Needs: No Transportation Needs (12/27/2021)   PRAPARE - Hydrologist (Medical): No    Lack of Transportation (Non-Medical): No  Physical Activity: Insufficiently Active (12/27/2021)   Exercise Vital Sign    Days of Exercise per Week: 7 days    Minutes of Exercise per Session: 20 min  Stress: No Stress Concern Present (12/27/2021)   [employer]    Feeling of Stress : Not at all  Social Connections: Moderately Integrated (12/27/2021)   Social Connection and Isolation Panel [NHANES]    Frequency of Communication with Friends and Family: More than three times a week    Frequency of Social Gatherings with Friends and Family: Once a week    Attends Religious Services: 1 to 4 times per year    Active Member of [employer] or Organizations: Yes    Attends Archivist Meetings: 1 to 4 times per year    Marital Status: Widowed   Past Surgical History:  Procedure Laterality Date   ABDOMINAL HYSTERECTOMY     APPENDECTOMY     BACK SURGERY     BREAST SURGERY     left breast biopsy   CARDIOVERSION N/A 05/09/2020   Procedure: CARDIOVERSION;  Surgeon: Skeet Latch, MD;  Location: [place];  Service: Cardiovascular;  Laterality: N/A;   COLOSTOMY N/A 01/06/2020   Procedure: Colostomy;  Surgeon: Greer Pickerel, MD;  Location: [place];  Service: General;  Laterality: N/A;   EYE SURGERY     cataract   HARDWARE REMOVAL Left 01/01/2020   Procedure: HARDWARE REMOVAL;  Surgeon: Iran Planas, MD;  Location: [place];  Service: Orthopedics;  Laterality: Left;   HIP ARTHROPLASTY Left 12/03/2020   Procedure: ARTHROPLASTY BIPOLAR HIP (HEMIARTHROPLASTY);  Surgeon: Renette Butters, MD;  Location: [place];  Service: Orthopedics;  Laterality: Left;   HIP ARTHROPLASTY Left    partial   IR KYPHO EA ADDL LEVEL THORACIC OR LUMBAR  06/27/2021   IR RADIOLOGIST EVAL & MGMT  07/13/2021   LAPAROTOMY N/A 01/06/2020   Procedure: Exploratory Laparotomy, Open Sigmoid colectomy;  Surgeon: Greer Pickerel, MD;  Location: [place];  Service: General;  Laterality: N/A;   LIPOMA EXCISION Left 04/25/2015  Procedure: EXCISION OF LIPOMA LEFT ARM;  Surgeon: Donnie Mesa, MD;  Location: [place];  Service: General;  Laterality: Left;   LUMBAR LAMINECTOMY/DECOMPRESSION MICRODISCECTOMY  11/20/2011   Procedure: LUMBAR LAMINECTOMY/DECOMPRESSION MICRODISCECTOMY;  Surgeon: Laurice Record [place];  Location: [facility];  Service: Orthopedics;  Laterality: N/A;  Decompressive Laminectomy L2 to the Sacrum (X-Ray)   LYSIS OF ADHESION N/A 01/17/2021   Procedure: RIGID PROCTOSCOPY;  Surgeon: Michael Boston, MD;  Location: [facility];  Service: General;  Laterality: N/A;   OPEN REDUCTION INTERNAL FIXATION (ORIF) DISTAL RADIAL FRACTURE Left 01/01/2020   Procedure: OPEN REDUCTION INTERNAL FIXATION (ORIF) DISTAL RADIAL FRACTURE;  Surgeon: Iran Planas, MD;  Location: [place];  Service: Orthopedics;  Laterality: Left;   OTHER SURGICAL HISTORY     left wrist surgery - has plate in left wrist    OTHER SURGICAL HISTORY     right knee surgery due to torn cartilage    TONSILLECTOMY     WRIST FRACTURE SURGERY Left    XI ROBOTIC ASSISTED COLOSTOMY TAKEDOWN N/A 01/17/2021   Procedure: XI ROBOTIC ASSISTED OSTOMY TAKEDOWN, LYSIS OF ADHESIONS, RECTOSIGMOID RESECTION,  BILATERAL TAP BLOCK;  Surgeon: Michael Boston, MD;  Location: [facility];  Service: General;  Laterality: N/A;   Past Medical History:  Diagnosis Date   Closed fracture of left distal radius 12/29/2019   DVT (deep venous thrombosis) ([place]) 09/2015   RLE   Dysrhythmia    Afib   GERD (gastroesophageal reflux disease)    Hip fracture ([place]) 12/02/2020   Hypertension    Hypothyroidism    Melanoma ([place]) 06/16/2017   Facial melanoma, removed by Dr. Harvel Quale   Peripheral vascular disease ([place])    peripheral neuropathy   BP 125/69   Pulse 69   Ht '5\' 6"'$  (1.676 m)   SpO2 91%   BMI 23.08 kg/m   Opioid Risk Score:   Fall Risk Score:  `1  Depression screen [facility] 2/9     05/17/2022   11:30 AM 03/12/2022    2:02 PM 12/27/2021   12:54 PM 10/24/2021    1:37 PM 04/19/2021   10:31 AM 02/01/2021   12:49 PM 01/10/2021   11:34 AM  Depression screen PHQ 2/9  Decreased Interest 1 0 1 1 0 0 0  Down, Depressed, Hopeless 1 0 1 1 0 0 0  PHQ - 2 Score 2 0 2 2 0 0 0  Altered sleeping 0 0 0 0 0 0 0  Tired, decreased energy 1 0 1 1 0 0 0  Change in appetite 0 0 0 0 0 0 0  Feeling bad or failure about yourself  2 0 0 0 0 0 0  Trouble concentrating 1 0 0 1 0 0 0  Moving slowly or fidgety/restless 0 0 0 0 0 0 0  Suicidal thoughts  0 0 0 0 0 0  PHQ-9 Score 6 0 3 4 0 0 0  Difficult doing work/chores  Not difficult at all Somewhat difficult Somewhat difficult Not difficult at all Not difficult at all Not difficult at all     Review of Systems  Constitutional: Negative.   HENT: Negative.    Eyes:  Positive for pain.  Respiratory: Negative.    Cardiovascular:  Positive for leg swelling.  Gastrointestinal: Negative.   Endocrine: Negative.   Genitourinary:  Positive for frequency.  Musculoskeletal:  Positive for gait problem.  Skin: Negative.   Allergic/Immunologic: Negative.   Neurological:  Positive for dizziness, weakness and numbness.  Hematological:  Bruises/bleeds easily.  Psychiatric/Behavioral: Negative.         Objective:   Physical Exam Vitals and nursing note reviewed.  Constitutional:      Appearance: Normal appearance.  Cardiovascular:     Rate and Rhythm: Normal rate and regular rhythm.     Pulses: Normal pulses.     Heart sounds: Normal heart sounds.  Pulmonary:     Effort: Pulmonary effort is normal.     Breath sounds: Normal breath sounds.  Musculoskeletal:     Cervical back: Normal range of motion and neck supple.     Right lower leg: Edema present.     Left lower leg: Edema present.     Comments: Normal Muscle Bulk and Muscle Testing Reveals:  Upper Extremities: Right : Full ROM and Muscle Strength 4/5 Left Upper Extremity: Decreased ROM 45 Degrees and Muscle Strength 5/5  Lower Extremities: Full ROM and Muscle Strength 5/5 Arrived in Wheelchair     Neurological:     Mental Status: She is alert and oriented to person, place, and time.  Psychiatric:        Mood and Affect: Mood normal.        Behavior: Behavior normal.         Assessment & Plan:    ICH ( Intracerebral Hemorrhage): Continue Therapy at SNF. She has a scheduled appointment with Neurology. Continue to Monitor.  Persistent Atrial Fibrillation: Cardiology Following. Continue to Monitor.  Essential Hypertension. Continue current medication regimen. PCP following.   F/U with Dr Naaman Plummer in 4-6 weeks

## 2022-05-17 NOTE — Telephone Encounter (Signed)
[place], PT from [facility] called to answer a question concerning the patient's therapy. She can be reached at the facility [phone] or her cell [phone]

## 2022-05-20 DIAGNOSIS — H40051 Ocular hypertension, right eye: Secondary | ICD-10-CM | POA: Diagnosis not present

## 2022-05-20 DIAGNOSIS — H34811 Central retinal vein occlusion, right eye, with macular edema: Secondary | ICD-10-CM | POA: Diagnosis not present

## 2022-05-20 NOTE — Telephone Encounter (Addendum)
Return [place] the Physical therapist call.  Holly states [name] has ataxia, due to safety concerns. Staff is only advise to do transfers with [name] at this time.  The Physical Therapist is requiring someone to walk behind her with a wheelchair while she ( [name]) is walking with the therapist as well.

## 2022-05-21 DIAGNOSIS — K5900 Constipation, unspecified: Secondary | ICD-10-CM | POA: Diagnosis not present

## 2022-05-23 ENCOUNTER — Inpatient Hospital Stay: Payer: Medicare PPO | Admitting: Neurology

## 2022-05-27 DIAGNOSIS — R2689 Other abnormalities of gait and mobility: Secondary | ICD-10-CM | POA: Diagnosis not present

## 2022-05-27 DIAGNOSIS — I619 Nontraumatic intracerebral hemorrhage, unspecified: Secondary | ICD-10-CM | POA: Diagnosis not present

## 2022-05-27 DIAGNOSIS — I69191 Dysphagia following nontraumatic intracerebral hemorrhage: Secondary | ICD-10-CM | POA: Diagnosis not present

## 2022-05-27 DIAGNOSIS — R4701 Aphasia: Secondary | ICD-10-CM | POA: Diagnosis not present

## 2022-05-27 DIAGNOSIS — M6281 Muscle weakness (generalized): Secondary | ICD-10-CM | POA: Diagnosis not present

## 2022-05-28 DIAGNOSIS — M6281 Muscle weakness (generalized): Secondary | ICD-10-CM | POA: Diagnosis not present

## 2022-05-28 DIAGNOSIS — K5900 Constipation, unspecified: Secondary | ICD-10-CM | POA: Diagnosis not present

## 2022-05-28 DIAGNOSIS — I69191 Dysphagia following nontraumatic intracerebral hemorrhage: Secondary | ICD-10-CM | POA: Diagnosis not present

## 2022-05-28 DIAGNOSIS — H3530 Unspecified macular degeneration: Secondary | ICD-10-CM | POA: Diagnosis not present

## 2022-05-28 DIAGNOSIS — Z87440 Personal history of urinary (tract) infections: Secondary | ICD-10-CM | POA: Diagnosis not present

## 2022-05-28 DIAGNOSIS — R4701 Aphasia: Secondary | ICD-10-CM | POA: Diagnosis not present

## 2022-05-28 DIAGNOSIS — R2689 Other abnormalities of gait and mobility: Secondary | ICD-10-CM | POA: Diagnosis not present

## 2022-05-28 DIAGNOSIS — I619 Nontraumatic intracerebral hemorrhage, unspecified: Secondary | ICD-10-CM | POA: Diagnosis not present

## 2022-05-29 DIAGNOSIS — I69191 Dysphagia following nontraumatic intracerebral hemorrhage: Secondary | ICD-10-CM | POA: Diagnosis not present

## 2022-05-29 DIAGNOSIS — I619 Nontraumatic intracerebral hemorrhage, unspecified: Secondary | ICD-10-CM | POA: Diagnosis not present

## 2022-05-29 DIAGNOSIS — H353132 Nonexudative age-related macular degeneration, bilateral, intermediate dry stage: Secondary | ICD-10-CM | POA: Diagnosis not present

## 2022-05-29 DIAGNOSIS — R4701 Aphasia: Secondary | ICD-10-CM | POA: Diagnosis not present

## 2022-05-29 DIAGNOSIS — H34811 Central retinal vein occlusion, right eye, with macular edema: Secondary | ICD-10-CM | POA: Diagnosis not present

## 2022-05-29 DIAGNOSIS — M6281 Muscle weakness (generalized): Secondary | ICD-10-CM | POA: Diagnosis not present

## 2022-05-29 DIAGNOSIS — H4051X2 Glaucoma secondary to other eye disorders, right eye, moderate stage: Secondary | ICD-10-CM | POA: Diagnosis not present

## 2022-05-29 DIAGNOSIS — R2689 Other abnormalities of gait and mobility: Secondary | ICD-10-CM | POA: Diagnosis not present

## 2022-05-30 DIAGNOSIS — R4701 Aphasia: Secondary | ICD-10-CM | POA: Diagnosis not present

## 2022-05-30 DIAGNOSIS — R2689 Other abnormalities of gait and mobility: Secondary | ICD-10-CM | POA: Diagnosis not present

## 2022-05-30 DIAGNOSIS — I69191 Dysphagia following nontraumatic intracerebral hemorrhage: Secondary | ICD-10-CM | POA: Diagnosis not present

## 2022-05-30 DIAGNOSIS — M6281 Muscle weakness (generalized): Secondary | ICD-10-CM | POA: Diagnosis not present

## 2022-05-30 DIAGNOSIS — I619 Nontraumatic intracerebral hemorrhage, unspecified: Secondary | ICD-10-CM | POA: Diagnosis not present

## 2022-05-31 DIAGNOSIS — S50811A Abrasion of right forearm, initial encounter: Secondary | ICD-10-CM | POA: Diagnosis not present

## 2022-05-31 DIAGNOSIS — S50311A Abrasion of right elbow, initial encounter: Secondary | ICD-10-CM | POA: Diagnosis not present

## 2022-05-31 DIAGNOSIS — R2689 Other abnormalities of gait and mobility: Secondary | ICD-10-CM | POA: Diagnosis not present

## 2022-05-31 DIAGNOSIS — R4701 Aphasia: Secondary | ICD-10-CM | POA: Diagnosis not present

## 2022-05-31 DIAGNOSIS — M6281 Muscle weakness (generalized): Secondary | ICD-10-CM | POA: Diagnosis not present

## 2022-05-31 DIAGNOSIS — D649 Anemia, unspecified: Secondary | ICD-10-CM | POA: Diagnosis not present

## 2022-05-31 DIAGNOSIS — I619 Nontraumatic intracerebral hemorrhage, unspecified: Secondary | ICD-10-CM | POA: Diagnosis not present

## 2022-05-31 DIAGNOSIS — I69191 Dysphagia following nontraumatic intracerebral hemorrhage: Secondary | ICD-10-CM | POA: Diagnosis not present

## 2022-05-31 DIAGNOSIS — E039 Hypothyroidism, unspecified: Secondary | ICD-10-CM | POA: Diagnosis not present

## 2022-06-01 DIAGNOSIS — R2689 Other abnormalities of gait and mobility: Secondary | ICD-10-CM | POA: Diagnosis not present

## 2022-06-01 DIAGNOSIS — R4701 Aphasia: Secondary | ICD-10-CM | POA: Diagnosis not present

## 2022-06-01 DIAGNOSIS — I619 Nontraumatic intracerebral hemorrhage, unspecified: Secondary | ICD-10-CM | POA: Diagnosis not present

## 2022-06-01 DIAGNOSIS — I69191 Dysphagia following nontraumatic intracerebral hemorrhage: Secondary | ICD-10-CM | POA: Diagnosis not present

## 2022-06-01 DIAGNOSIS — M6281 Muscle weakness (generalized): Secondary | ICD-10-CM | POA: Diagnosis not present

## 2022-06-03 DIAGNOSIS — R2689 Other abnormalities of gait and mobility: Secondary | ICD-10-CM | POA: Diagnosis not present

## 2022-06-03 DIAGNOSIS — R4701 Aphasia: Secondary | ICD-10-CM | POA: Diagnosis not present

## 2022-06-03 DIAGNOSIS — M6281 Muscle weakness (generalized): Secondary | ICD-10-CM | POA: Diagnosis not present

## 2022-06-03 DIAGNOSIS — I619 Nontraumatic intracerebral hemorrhage, unspecified: Secondary | ICD-10-CM | POA: Diagnosis not present

## 2022-06-03 DIAGNOSIS — I69191 Dysphagia following nontraumatic intracerebral hemorrhage: Secondary | ICD-10-CM | POA: Diagnosis not present

## 2022-06-04 DIAGNOSIS — R2689 Other abnormalities of gait and mobility: Secondary | ICD-10-CM | POA: Diagnosis not present

## 2022-06-04 DIAGNOSIS — I69191 Dysphagia following nontraumatic intracerebral hemorrhage: Secondary | ICD-10-CM | POA: Diagnosis not present

## 2022-06-04 DIAGNOSIS — R4701 Aphasia: Secondary | ICD-10-CM | POA: Diagnosis not present

## 2022-06-04 DIAGNOSIS — M6281 Muscle weakness (generalized): Secondary | ICD-10-CM | POA: Diagnosis not present

## 2022-06-04 DIAGNOSIS — I619 Nontraumatic intracerebral hemorrhage, unspecified: Secondary | ICD-10-CM | POA: Diagnosis not present

## 2022-06-05 DIAGNOSIS — N390 Urinary tract infection, site not specified: Secondary | ICD-10-CM | POA: Diagnosis not present

## 2022-06-05 DIAGNOSIS — Z7689 Persons encountering health services in other specified circumstances: Secondary | ICD-10-CM | POA: Diagnosis not present

## 2022-06-05 DIAGNOSIS — Z87440 Personal history of urinary (tract) infections: Secondary | ICD-10-CM | POA: Diagnosis not present

## 2022-06-05 DIAGNOSIS — M6281 Muscle weakness (generalized): Secondary | ICD-10-CM | POA: Diagnosis not present

## 2022-06-05 DIAGNOSIS — I619 Nontraumatic intracerebral hemorrhage, unspecified: Secondary | ICD-10-CM | POA: Diagnosis not present

## 2022-06-05 DIAGNOSIS — I69191 Dysphagia following nontraumatic intracerebral hemorrhage: Secondary | ICD-10-CM | POA: Diagnosis not present

## 2022-06-05 DIAGNOSIS — R4701 Aphasia: Secondary | ICD-10-CM | POA: Diagnosis not present

## 2022-06-05 DIAGNOSIS — R2689 Other abnormalities of gait and mobility: Secondary | ICD-10-CM | POA: Diagnosis not present

## 2022-06-06 DIAGNOSIS — R4701 Aphasia: Secondary | ICD-10-CM | POA: Diagnosis not present

## 2022-06-06 DIAGNOSIS — I10 Essential (primary) hypertension: Secondary | ICD-10-CM | POA: Diagnosis not present

## 2022-06-06 DIAGNOSIS — R2689 Other abnormalities of gait and mobility: Secondary | ICD-10-CM | POA: Diagnosis not present

## 2022-06-06 DIAGNOSIS — I69191 Dysphagia following nontraumatic intracerebral hemorrhage: Secondary | ICD-10-CM | POA: Diagnosis not present

## 2022-06-06 DIAGNOSIS — M6281 Muscle weakness (generalized): Secondary | ICD-10-CM | POA: Diagnosis not present

## 2022-06-06 DIAGNOSIS — I619 Nontraumatic intracerebral hemorrhage, unspecified: Secondary | ICD-10-CM | POA: Diagnosis not present

## 2022-06-07 DIAGNOSIS — Z7689 Persons encountering health services in other specified circumstances: Secondary | ICD-10-CM | POA: Diagnosis not present

## 2022-06-07 DIAGNOSIS — N184 Chronic kidney disease, stage 4 (severe): Secondary | ICD-10-CM | POA: Diagnosis not present

## 2022-06-07 DIAGNOSIS — D649 Anemia, unspecified: Secondary | ICD-10-CM | POA: Diagnosis not present

## 2022-06-07 DIAGNOSIS — I619 Nontraumatic intracerebral hemorrhage, unspecified: Secondary | ICD-10-CM | POA: Diagnosis not present

## 2022-06-07 DIAGNOSIS — I69191 Dysphagia following nontraumatic intracerebral hemorrhage: Secondary | ICD-10-CM | POA: Diagnosis not present

## 2022-06-07 DIAGNOSIS — N390 Urinary tract infection, site not specified: Secondary | ICD-10-CM | POA: Diagnosis not present

## 2022-06-07 DIAGNOSIS — S80811A Abrasion, right lower leg, initial encounter: Secondary | ICD-10-CM | POA: Diagnosis not present

## 2022-06-07 DIAGNOSIS — S50811D Abrasion of right forearm, subsequent encounter: Secondary | ICD-10-CM | POA: Diagnosis not present

## 2022-06-07 DIAGNOSIS — E039 Hypothyroidism, unspecified: Secondary | ICD-10-CM | POA: Diagnosis not present

## 2022-06-07 DIAGNOSIS — M6281 Muscle weakness (generalized): Secondary | ICD-10-CM | POA: Diagnosis not present

## 2022-06-07 DIAGNOSIS — R2689 Other abnormalities of gait and mobility: Secondary | ICD-10-CM | POA: Diagnosis not present

## 2022-06-07 DIAGNOSIS — R4701 Aphasia: Secondary | ICD-10-CM | POA: Diagnosis not present

## 2022-06-08 DIAGNOSIS — R4701 Aphasia: Secondary | ICD-10-CM | POA: Diagnosis not present

## 2022-06-08 DIAGNOSIS — M6281 Muscle weakness (generalized): Secondary | ICD-10-CM | POA: Diagnosis not present

## 2022-06-08 DIAGNOSIS — R2689 Other abnormalities of gait and mobility: Secondary | ICD-10-CM | POA: Diagnosis not present

## 2022-06-08 DIAGNOSIS — I619 Nontraumatic intracerebral hemorrhage, unspecified: Secondary | ICD-10-CM | POA: Diagnosis not present

## 2022-06-08 DIAGNOSIS — I69191 Dysphagia following nontraumatic intracerebral hemorrhage: Secondary | ICD-10-CM | POA: Diagnosis not present

## 2022-06-10 DIAGNOSIS — R4701 Aphasia: Secondary | ICD-10-CM | POA: Diagnosis not present

## 2022-06-10 DIAGNOSIS — I69191 Dysphagia following nontraumatic intracerebral hemorrhage: Secondary | ICD-10-CM | POA: Diagnosis not present

## 2022-06-10 DIAGNOSIS — I619 Nontraumatic intracerebral hemorrhage, unspecified: Secondary | ICD-10-CM | POA: Diagnosis not present

## 2022-06-10 DIAGNOSIS — R2689 Other abnormalities of gait and mobility: Secondary | ICD-10-CM | POA: Diagnosis not present

## 2022-06-10 DIAGNOSIS — M6281 Muscle weakness (generalized): Secondary | ICD-10-CM | POA: Diagnosis not present

## 2022-06-11 DIAGNOSIS — S80812A Abrasion, left lower leg, initial encounter: Secondary | ICD-10-CM | POA: Diagnosis not present

## 2022-06-11 DIAGNOSIS — I619 Nontraumatic intracerebral hemorrhage, unspecified: Secondary | ICD-10-CM | POA: Diagnosis not present

## 2022-06-11 DIAGNOSIS — M6281 Muscle weakness (generalized): Secondary | ICD-10-CM | POA: Diagnosis not present

## 2022-06-11 DIAGNOSIS — D649 Anemia, unspecified: Secondary | ICD-10-CM | POA: Diagnosis not present

## 2022-06-11 DIAGNOSIS — W19XXXA Unspecified fall, initial encounter: Secondary | ICD-10-CM | POA: Diagnosis not present

## 2022-06-11 DIAGNOSIS — M353 Polymyalgia rheumatica: Secondary | ICD-10-CM | POA: Diagnosis not present

## 2022-06-11 DIAGNOSIS — R279 Unspecified lack of coordination: Secondary | ICD-10-CM | POA: Diagnosis not present

## 2022-06-11 DIAGNOSIS — R2689 Other abnormalities of gait and mobility: Secondary | ICD-10-CM | POA: Diagnosis not present

## 2022-06-11 DIAGNOSIS — S50811D Abrasion of right forearm, subsequent encounter: Secondary | ICD-10-CM | POA: Diagnosis not present

## 2022-06-11 DIAGNOSIS — S80811D Abrasion, right lower leg, subsequent encounter: Secondary | ICD-10-CM | POA: Diagnosis not present

## 2022-06-11 DIAGNOSIS — Z9181 History of falling: Secondary | ICD-10-CM | POA: Diagnosis not present

## 2022-06-12 DIAGNOSIS — I619 Nontraumatic intracerebral hemorrhage, unspecified: Secondary | ICD-10-CM | POA: Diagnosis not present

## 2022-06-12 DIAGNOSIS — R279 Unspecified lack of coordination: Secondary | ICD-10-CM | POA: Diagnosis not present

## 2022-06-12 DIAGNOSIS — R2689 Other abnormalities of gait and mobility: Secondary | ICD-10-CM | POA: Diagnosis not present

## 2022-06-12 DIAGNOSIS — M6281 Muscle weakness (generalized): Secondary | ICD-10-CM | POA: Diagnosis not present

## 2022-06-13 DIAGNOSIS — R279 Unspecified lack of coordination: Secondary | ICD-10-CM | POA: Diagnosis not present

## 2022-06-13 DIAGNOSIS — M6281 Muscle weakness (generalized): Secondary | ICD-10-CM | POA: Diagnosis not present

## 2022-06-13 DIAGNOSIS — R2689 Other abnormalities of gait and mobility: Secondary | ICD-10-CM | POA: Diagnosis not present

## 2022-06-13 DIAGNOSIS — I619 Nontraumatic intracerebral hemorrhage, unspecified: Secondary | ICD-10-CM | POA: Diagnosis not present

## 2022-06-14 DIAGNOSIS — I619 Nontraumatic intracerebral hemorrhage, unspecified: Secondary | ICD-10-CM | POA: Diagnosis not present

## 2022-06-14 DIAGNOSIS — R2689 Other abnormalities of gait and mobility: Secondary | ICD-10-CM | POA: Diagnosis not present

## 2022-06-14 DIAGNOSIS — M6281 Muscle weakness (generalized): Secondary | ICD-10-CM | POA: Diagnosis not present

## 2022-06-14 DIAGNOSIS — R279 Unspecified lack of coordination: Secondary | ICD-10-CM | POA: Diagnosis not present

## 2022-06-15 DIAGNOSIS — I619 Nontraumatic intracerebral hemorrhage, unspecified: Secondary | ICD-10-CM | POA: Diagnosis not present

## 2022-06-15 DIAGNOSIS — R2689 Other abnormalities of gait and mobility: Secondary | ICD-10-CM | POA: Diagnosis not present

## 2022-06-15 DIAGNOSIS — M6281 Muscle weakness (generalized): Secondary | ICD-10-CM | POA: Diagnosis not present

## 2022-06-15 DIAGNOSIS — R279 Unspecified lack of coordination: Secondary | ICD-10-CM | POA: Diagnosis not present

## 2022-06-17 DIAGNOSIS — R2689 Other abnormalities of gait and mobility: Secondary | ICD-10-CM | POA: Diagnosis not present

## 2022-06-17 DIAGNOSIS — R279 Unspecified lack of coordination: Secondary | ICD-10-CM | POA: Diagnosis not present

## 2022-06-17 DIAGNOSIS — M6281 Muscle weakness (generalized): Secondary | ICD-10-CM | POA: Diagnosis not present

## 2022-06-17 DIAGNOSIS — I619 Nontraumatic intracerebral hemorrhage, unspecified: Secondary | ICD-10-CM | POA: Diagnosis not present

## 2022-06-18 DIAGNOSIS — N3281 Overactive bladder: Secondary | ICD-10-CM | POA: Diagnosis not present

## 2022-06-18 DIAGNOSIS — R2689 Other abnormalities of gait and mobility: Secondary | ICD-10-CM | POA: Diagnosis not present

## 2022-06-18 DIAGNOSIS — I619 Nontraumatic intracerebral hemorrhage, unspecified: Secondary | ICD-10-CM | POA: Diagnosis not present

## 2022-06-18 DIAGNOSIS — R350 Frequency of micturition: Secondary | ICD-10-CM | POA: Diagnosis not present

## 2022-06-18 DIAGNOSIS — R279 Unspecified lack of coordination: Secondary | ICD-10-CM | POA: Diagnosis not present

## 2022-06-18 DIAGNOSIS — M6281 Muscle weakness (generalized): Secondary | ICD-10-CM | POA: Diagnosis not present

## 2022-06-19 DIAGNOSIS — M6281 Muscle weakness (generalized): Secondary | ICD-10-CM | POA: Diagnosis not present

## 2022-06-19 DIAGNOSIS — H4051X2 Glaucoma secondary to other eye disorders, right eye, moderate stage: Secondary | ICD-10-CM | POA: Diagnosis not present

## 2022-06-19 DIAGNOSIS — R2689 Other abnormalities of gait and mobility: Secondary | ICD-10-CM | POA: Diagnosis not present

## 2022-06-19 DIAGNOSIS — I619 Nontraumatic intracerebral hemorrhage, unspecified: Secondary | ICD-10-CM | POA: Diagnosis not present

## 2022-06-19 DIAGNOSIS — R279 Unspecified lack of coordination: Secondary | ICD-10-CM | POA: Diagnosis not present

## 2022-06-20 DIAGNOSIS — E46 Unspecified protein-calorie malnutrition: Secondary | ICD-10-CM | POA: Diagnosis not present

## 2022-06-20 DIAGNOSIS — I10 Essential (primary) hypertension: Secondary | ICD-10-CM | POA: Diagnosis not present

## 2022-06-20 DIAGNOSIS — D649 Anemia, unspecified: Secondary | ICD-10-CM | POA: Diagnosis not present

## 2022-06-20 DIAGNOSIS — I619 Nontraumatic intracerebral hemorrhage, unspecified: Secondary | ICD-10-CM | POA: Diagnosis not present

## 2022-06-20 DIAGNOSIS — E039 Hypothyroidism, unspecified: Secondary | ICD-10-CM | POA: Diagnosis not present

## 2022-06-20 DIAGNOSIS — R279 Unspecified lack of coordination: Secondary | ICD-10-CM | POA: Diagnosis not present

## 2022-06-20 DIAGNOSIS — R2689 Other abnormalities of gait and mobility: Secondary | ICD-10-CM | POA: Diagnosis not present

## 2022-06-20 DIAGNOSIS — M6281 Muscle weakness (generalized): Secondary | ICD-10-CM | POA: Diagnosis not present

## 2022-06-21 DIAGNOSIS — S81811A Laceration without foreign body, right lower leg, initial encounter: Secondary | ICD-10-CM | POA: Diagnosis not present

## 2022-06-21 DIAGNOSIS — R2689 Other abnormalities of gait and mobility: Secondary | ICD-10-CM | POA: Diagnosis not present

## 2022-06-21 DIAGNOSIS — R279 Unspecified lack of coordination: Secondary | ICD-10-CM | POA: Diagnosis not present

## 2022-06-21 DIAGNOSIS — N3281 Overactive bladder: Secondary | ICD-10-CM | POA: Diagnosis not present

## 2022-06-21 DIAGNOSIS — R609 Edema, unspecified: Secondary | ICD-10-CM | POA: Diagnosis not present

## 2022-06-21 DIAGNOSIS — R2241 Localized swelling, mass and lump, right lower limb: Secondary | ICD-10-CM | POA: Diagnosis not present

## 2022-06-21 DIAGNOSIS — I619 Nontraumatic intracerebral hemorrhage, unspecified: Secondary | ICD-10-CM | POA: Diagnosis not present

## 2022-06-21 DIAGNOSIS — M6281 Muscle weakness (generalized): Secondary | ICD-10-CM | POA: Diagnosis not present

## 2022-06-22 DIAGNOSIS — I619 Nontraumatic intracerebral hemorrhage, unspecified: Secondary | ICD-10-CM | POA: Diagnosis not present

## 2022-06-22 DIAGNOSIS — R2689 Other abnormalities of gait and mobility: Secondary | ICD-10-CM | POA: Diagnosis not present

## 2022-06-22 DIAGNOSIS — M6281 Muscle weakness (generalized): Secondary | ICD-10-CM | POA: Diagnosis not present

## 2022-06-22 DIAGNOSIS — R279 Unspecified lack of coordination: Secondary | ICD-10-CM | POA: Diagnosis not present

## 2022-06-24 DIAGNOSIS — M7121 Synovial cyst of popliteal space [Baker], right knee: Secondary | ICD-10-CM | POA: Diagnosis not present

## 2022-06-24 DIAGNOSIS — M6281 Muscle weakness (generalized): Secondary | ICD-10-CM | POA: Diagnosis not present

## 2022-06-24 DIAGNOSIS — R2689 Other abnormalities of gait and mobility: Secondary | ICD-10-CM | POA: Diagnosis not present

## 2022-06-24 DIAGNOSIS — I619 Nontraumatic intracerebral hemorrhage, unspecified: Secondary | ICD-10-CM | POA: Diagnosis not present

## 2022-06-24 DIAGNOSIS — R279 Unspecified lack of coordination: Secondary | ICD-10-CM | POA: Diagnosis not present

## 2022-06-25 DIAGNOSIS — M6281 Muscle weakness (generalized): Secondary | ICD-10-CM | POA: Diagnosis not present

## 2022-06-25 DIAGNOSIS — R279 Unspecified lack of coordination: Secondary | ICD-10-CM | POA: Diagnosis not present

## 2022-06-25 DIAGNOSIS — R2689 Other abnormalities of gait and mobility: Secondary | ICD-10-CM | POA: Diagnosis not present

## 2022-06-25 DIAGNOSIS — I619 Nontraumatic intracerebral hemorrhage, unspecified: Secondary | ICD-10-CM | POA: Diagnosis not present

## 2022-06-26 DIAGNOSIS — I619 Nontraumatic intracerebral hemorrhage, unspecified: Secondary | ICD-10-CM | POA: Diagnosis not present

## 2022-06-26 DIAGNOSIS — R279 Unspecified lack of coordination: Secondary | ICD-10-CM | POA: Diagnosis not present

## 2022-06-26 DIAGNOSIS — R2689 Other abnormalities of gait and mobility: Secondary | ICD-10-CM | POA: Diagnosis not present

## 2022-06-26 DIAGNOSIS — M6281 Muscle weakness (generalized): Secondary | ICD-10-CM | POA: Diagnosis not present

## 2022-06-27 DIAGNOSIS — R2689 Other abnormalities of gait and mobility: Secondary | ICD-10-CM | POA: Diagnosis not present

## 2022-06-27 DIAGNOSIS — R279 Unspecified lack of coordination: Secondary | ICD-10-CM | POA: Diagnosis not present

## 2022-06-27 DIAGNOSIS — M6281 Muscle weakness (generalized): Secondary | ICD-10-CM | POA: Diagnosis not present

## 2022-06-27 DIAGNOSIS — I619 Nontraumatic intracerebral hemorrhage, unspecified: Secondary | ICD-10-CM | POA: Diagnosis not present

## 2022-06-28 DIAGNOSIS — S80812D Abrasion, left lower leg, subsequent encounter: Secondary | ICD-10-CM | POA: Diagnosis not present

## 2022-06-28 DIAGNOSIS — S50811D Abrasion of right forearm, subsequent encounter: Secondary | ICD-10-CM | POA: Diagnosis not present

## 2022-06-28 DIAGNOSIS — R2689 Other abnormalities of gait and mobility: Secondary | ICD-10-CM | POA: Diagnosis not present

## 2022-06-28 DIAGNOSIS — S80811D Abrasion, right lower leg, subsequent encounter: Secondary | ICD-10-CM | POA: Diagnosis not present

## 2022-06-28 DIAGNOSIS — M6281 Muscle weakness (generalized): Secondary | ICD-10-CM | POA: Diagnosis not present

## 2022-06-28 DIAGNOSIS — I619 Nontraumatic intracerebral hemorrhage, unspecified: Secondary | ICD-10-CM | POA: Diagnosis not present

## 2022-06-28 DIAGNOSIS — S80811A Abrasion, right lower leg, initial encounter: Secondary | ICD-10-CM | POA: Diagnosis not present

## 2022-06-28 DIAGNOSIS — R279 Unspecified lack of coordination: Secondary | ICD-10-CM | POA: Diagnosis not present

## 2022-06-29 DIAGNOSIS — I619 Nontraumatic intracerebral hemorrhage, unspecified: Secondary | ICD-10-CM | POA: Diagnosis not present

## 2022-06-29 DIAGNOSIS — M6281 Muscle weakness (generalized): Secondary | ICD-10-CM | POA: Diagnosis not present

## 2022-06-29 DIAGNOSIS — R2689 Other abnormalities of gait and mobility: Secondary | ICD-10-CM | POA: Diagnosis not present

## 2022-06-29 DIAGNOSIS — R279 Unspecified lack of coordination: Secondary | ICD-10-CM | POA: Diagnosis not present

## 2022-07-01 DIAGNOSIS — R279 Unspecified lack of coordination: Secondary | ICD-10-CM | POA: Diagnosis not present

## 2022-07-01 DIAGNOSIS — M6281 Muscle weakness (generalized): Secondary | ICD-10-CM | POA: Diagnosis not present

## 2022-07-01 DIAGNOSIS — I619 Nontraumatic intracerebral hemorrhage, unspecified: Secondary | ICD-10-CM | POA: Diagnosis not present

## 2022-07-01 DIAGNOSIS — R2689 Other abnormalities of gait and mobility: Secondary | ICD-10-CM | POA: Diagnosis not present

## 2022-07-02 DIAGNOSIS — S80811D Abrasion, right lower leg, subsequent encounter: Secondary | ICD-10-CM | POA: Diagnosis not present

## 2022-07-02 DIAGNOSIS — S80811A Abrasion, right lower leg, initial encounter: Secondary | ICD-10-CM | POA: Diagnosis not present

## 2022-07-02 DIAGNOSIS — I619 Nontraumatic intracerebral hemorrhage, unspecified: Secondary | ICD-10-CM | POA: Diagnosis not present

## 2022-07-02 DIAGNOSIS — S50811D Abrasion of right forearm, subsequent encounter: Secondary | ICD-10-CM | POA: Diagnosis not present

## 2022-07-02 DIAGNOSIS — R279 Unspecified lack of coordination: Secondary | ICD-10-CM | POA: Diagnosis not present

## 2022-07-02 DIAGNOSIS — S80812D Abrasion, left lower leg, subsequent encounter: Secondary | ICD-10-CM | POA: Diagnosis not present

## 2022-07-02 DIAGNOSIS — R2689 Other abnormalities of gait and mobility: Secondary | ICD-10-CM | POA: Diagnosis not present

## 2022-07-02 DIAGNOSIS — M6281 Muscle weakness (generalized): Secondary | ICD-10-CM | POA: Diagnosis not present

## 2022-07-03 DIAGNOSIS — R197 Diarrhea, unspecified: Secondary | ICD-10-CM | POA: Diagnosis not present

## 2022-07-03 DIAGNOSIS — R112 Nausea with vomiting, unspecified: Secondary | ICD-10-CM | POA: Diagnosis not present

## 2022-07-03 DIAGNOSIS — I619 Nontraumatic intracerebral hemorrhage, unspecified: Secondary | ICD-10-CM | POA: Diagnosis not present

## 2022-07-03 DIAGNOSIS — R279 Unspecified lack of coordination: Secondary | ICD-10-CM | POA: Diagnosis not present

## 2022-07-03 DIAGNOSIS — R2689 Other abnormalities of gait and mobility: Secondary | ICD-10-CM | POA: Diagnosis not present

## 2022-07-03 DIAGNOSIS — M6281 Muscle weakness (generalized): Secondary | ICD-10-CM | POA: Diagnosis not present

## 2022-07-04 DIAGNOSIS — M6281 Muscle weakness (generalized): Secondary | ICD-10-CM | POA: Diagnosis not present

## 2022-07-04 DIAGNOSIS — I619 Nontraumatic intracerebral hemorrhage, unspecified: Secondary | ICD-10-CM | POA: Diagnosis not present

## 2022-07-04 DIAGNOSIS — R279 Unspecified lack of coordination: Secondary | ICD-10-CM | POA: Diagnosis not present

## 2022-07-04 DIAGNOSIS — R2689 Other abnormalities of gait and mobility: Secondary | ICD-10-CM | POA: Diagnosis not present

## 2022-07-05 DIAGNOSIS — R2689 Other abnormalities of gait and mobility: Secondary | ICD-10-CM | POA: Diagnosis not present

## 2022-07-05 DIAGNOSIS — M6281 Muscle weakness (generalized): Secondary | ICD-10-CM | POA: Diagnosis not present

## 2022-07-05 DIAGNOSIS — I619 Nontraumatic intracerebral hemorrhage, unspecified: Secondary | ICD-10-CM | POA: Diagnosis not present

## 2022-07-05 DIAGNOSIS — R279 Unspecified lack of coordination: Secondary | ICD-10-CM | POA: Diagnosis not present

## 2022-07-10 DIAGNOSIS — E46 Unspecified protein-calorie malnutrition: Secondary | ICD-10-CM | POA: Diagnosis not present

## 2022-07-10 DIAGNOSIS — S80811A Abrasion, right lower leg, initial encounter: Secondary | ICD-10-CM | POA: Diagnosis not present

## 2022-07-10 DIAGNOSIS — D649 Anemia, unspecified: Secondary | ICD-10-CM | POA: Diagnosis not present

## 2022-07-10 DIAGNOSIS — K5900 Constipation, unspecified: Secondary | ICD-10-CM | POA: Diagnosis not present

## 2022-07-10 DIAGNOSIS — E039 Hypothyroidism, unspecified: Secondary | ICD-10-CM | POA: Diagnosis not present

## 2022-07-11 DIAGNOSIS — K5900 Constipation, unspecified: Secondary | ICD-10-CM | POA: Diagnosis not present

## 2022-07-11 DIAGNOSIS — Z7689 Persons encountering health services in other specified circumstances: Secondary | ICD-10-CM | POA: Diagnosis not present

## 2022-07-12 DIAGNOSIS — M1711 Unilateral primary osteoarthritis, right knee: Secondary | ICD-10-CM | POA: Diagnosis not present

## 2022-07-12 DIAGNOSIS — M25561 Pain in right knee: Secondary | ICD-10-CM | POA: Diagnosis not present

## 2022-07-18 ENCOUNTER — Ambulatory Visit: Payer: Medicare PPO | Admitting: Neurology

## 2022-07-18 ENCOUNTER — Encounter: Payer: Self-pay | Admitting: Neurology

## 2022-07-18 VITALS — BP 127/76 | HR 67 | Ht 66.0 in | Wt 105.0 lb

## 2022-07-18 DIAGNOSIS — G3184 Mild cognitive impairment, so stated: Secondary | ICD-10-CM | POA: Diagnosis not present

## 2022-07-18 DIAGNOSIS — R29898 Other symptoms and signs involving the musculoskeletal system: Secondary | ICD-10-CM

## 2022-07-18 DIAGNOSIS — I610 Nontraumatic intracerebral hemorrhage in hemisphere, subcortical: Secondary | ICD-10-CM

## 2022-07-18 DIAGNOSIS — I482 Chronic atrial fibrillation, unspecified: Secondary | ICD-10-CM

## 2022-07-18 MED ORDER — ASPIRIN 81 MG PO TBEC: 81.0000 mg | DELAYED_RELEASE_TABLET | Freq: Every day | ORAL | 12 refills | Status: AC

## 2022-07-18 NOTE — Progress Notes (Signed)
[facility] [address] [place]. [place] [address] [phone]       OFFICE CONSULT NOTE  [name] Date of Birth:  [birth year] Medical Record Number:  [id number]   Referring MD:   Rosalin Hawking  Reason for Referral: Intracerebral hemorrhage  HPI: [name] is a 88 year old pleasant Caucasian lady seen today for initial office consultation visit for intracerebral hemorrhage.  She is accompanied by her son and daughter.  History is obtained from them and review of electronic medical records and opossum reviewed pertinent available imaging films and PACS.  She has past medical history significant for hypertension, hypothyroidism, gastroesophageal flux disease, peripheral vascular disease, DVT, A-fib on Eliquis who presented on 03/25/2022 with sudden onset of slurred speech, aphasia and right-sided weakness.  The patient lives alone but had a life alert button which she pushed and family found her to have the above-mentioned symptoms.  CT scan of the head on admission showed a left basal ganglia intracerebral hemorrhage with volume 6 mL with trace intraventricular hemorrhage but no hydrocephalus.  NIH stroke scale on admission was 15 and modified Rankin score at baseline was 3.  ICH score was 2.  Patient was admitted to the ICU and her Eliquis was reversed with Andexxa and blood pressure was tightly controlled.  She remained stable she was transferred to inpatient rehab around 04/07/2022 she had sudden neurological worsening with decrease loss of consciousness and altered mentation.  Stat CT head was repeated which showed no acute abnormality and patient returned quickly back to her baseline.  CT angiogram of the brain and neck showed no large vessel stenosis or occlusion.  Carotid ultrasound showed no significant bilateral extracranial stenosis.  2D echo showed ejection fraction 55 to 60% with mildly dilated left atrium.  LDL cholesterol is 94 mg percent.  Hemoglobin A1c was  5.9.  Patient was discharged on no antithrombotic agents due to intracerebral hemorrhage.  Patient has done well since discharge.  She is able to ambulate with a walker but still needs 1 person assist.  She is still getting physical occupational therapy.  Her right foot is still weak and she has trouble controlling it.  She is also has decreased sensation in the right foot.  Her speech and right upper extremity weakness and coordination have improved significantly.  She is also had short-term memory difficulties following her intracerebral hemorrhage.  She is currently at [facility] skilled nursing facility and has recently finished physical and Occupational Therapy.  Family is wondering if she needs more therapy and they are considering paying for it out-of-pocket.  They have also seen the cardiologist Dr. Caryl Comes who strongly recommended patient going back on Eliquis but family has questions and concerns about this and here to discuss options with me.  ROS:   14 system review of systems is positive for weakness, numbness, easy bruisability and all other systems negative  PMH:  Past Medical History:  Diagnosis Date   Closed fracture of left distal radius 12/29/2019   DVT (deep venous thrombosis) ([place]) 09/2015   RLE   Dysrhythmia    Afib   GERD (gastroesophageal reflux disease)    Hip fracture ([place]) 12/02/2020   Hypertension    Hypothyroidism    Melanoma ([place]) 06/16/2017   Facial melanoma, removed by Dr. Harvel Quale   Peripheral vascular disease [facility])    peripheral neuropathy    Social History:  Social History   Socioeconomic History   Marital status: Widowed    Spouse name:  Not on file   Number of children: Not on file   Years of education: Not on file   Highest education level: Not on file  Occupational History   Occupation: retired  Tobacco Use   Smoking status: Never   Smokeless tobacco: Never  Vaping Use   Vaping Use: Never used  Substance and Sexual Activity   Alcohol use: No    Drug use: No   Sexual activity: Never  Other Topics Concern   Not on file  Social History Narrative   Lots of family nearby   She has an aide 4 days per week   Social Determinants of Health   Financial Resource Strain: Low Risk  (12/27/2021)   Overall Financial Resource Strain (CARDIA)    Difficulty of Paying Living Expenses: Not hard at all  Food Insecurity: No Food Insecurity (12/27/2021)   Hunger Vital Sign    Worried About Running Out of Food in the Last Year: Never true    [place] in the Last Year: Never true  Transportation Needs: No Transportation Needs (12/27/2021)   PRAPARE - Hydrologist (Medical): No    Lack of Transportation (Non-Medical): No  Physical Activity: Insufficiently Active (12/27/2021)   Exercise Vital Sign    Days of Exercise per Week: 7 days    Minutes of Exercise per Session: 20 min  Stress: No Stress Concern Present (12/27/2021)   [place]    Feeling of Stress : Not at all  Social Connections: Moderately Integrated (12/27/2021)   Social Connection and Isolation Panel [NHANES]    Frequency of Communication with Friends and Family: More than three times a week    Frequency of Social Gatherings with Friends and Family: Once a week    Attends Religious Services: 1 to 4 times per year    Active Member of [employer] or Organizations: Yes    Attends Archivist Meetings: 1 to 4 times per year    Marital Status: Widowed  Intimate Partner Violence: Not At Risk (12/27/2021)   Humiliation, Afraid, Rape, and Kick questionnaire    Fear of Current or Ex-Partner: No    Emotionally Abused: No    Physically Abused: No    Sexually Abused: No    Medications:   Current Outpatient Medications on File Prior to Visit  Medication Sig Dispense Refill   atorvastatin (LIPITOR) 20 MG tablet Take 1 tablet (20 mg total) by mouth daily.     ciprofloxacin (CIPRO) 500 MG  tablet Take 500 mg by mouth daily.     diclofenac Sodium (VOLTAREN) 1 % GEL Apply 2 g topically 3 (three) times daily.     diltiazem (CARDIZEM CD) 120 MG 24 hr capsule Take 1 capsule (120 mg total) by mouth every evening.     furosemide (LASIX) 40 MG tablet Take 1 tablet (40 mg total) by mouth daily. 30 tablet    gabapentin (NEURONTIN) 100 MG capsule Take 2 capsules (200 mg total) by mouth at bedtime.     levothyroxine (SYNTHROID) 50 MCG tablet Take 1 tablet (50 mcg total) by mouth daily before breakfast.     melatonin 3 MG TABS tablet Take 3 mg by mouth at bedtime.     Meloxicam 7.5 MG TBDP Take 1 tablet by mouth.     mirabegron ER (MYRBETRIQ) 25 MG TB24 tablet Take 25 mg by mouth daily.     Multiple Vitamins-Minerals (PRESERVISION AREDS 2  PO) Take 1 tablet by mouth daily with lunch.     nadolol (CORGARD) 80 MG tablet Take 1 tablet (80 mg total) by mouth daily.     nitroGLYCERIN (NITROSTAT) 0.4 MG SL tablet Place 1 tablet (0.4 mg total) under the tongue every 5 (five) minutes as needed for chest pain.  12   pantoprazole (PROTONIX) 40 MG tablet Take 1 tablet (40 mg total) by mouth daily. 90 tablet 1   Polyethylene Glycol 3350 (MIRALAX PO) Take by mouth.     polyvinyl alcohol (LIQUIFILM TEARS) 1.4 % ophthalmic solution Place 1 drop into the right eye as needed for dry eyes. 15 mL 0   predniSONE (DELTASONE) 1 MG tablet Take 2 tablets (2 mg total) by mouth daily with lunch.     Vibegron 75 MG TABS Take 1 tablet by mouth every evening.     No current facility-administered medications on file prior to visit.    Allergies:   Allergies  Allergen Reactions   Keflex [Cephalexin] Nausea Only and Other (See Comments)    Pt ended up in ER w/ CHEST PAIN.  Patient experienced chest pain again with re-challenge of cephalexin in May 2023.   Codeine Other (See Comments)    "Nightmares, imagined things"    Physical Exam General: Pleasant frail elderly Caucasian lady seated, in no evident distress Head:  head normocephalic and atraumatic.   Neck: supple with no carotid or supraclavicular bruits Cardiovascular: regular rate and rhythm, no murmurs Musculoskeletal: no deformity Skin:  no rash/petichiae Vascular:  Normal pulses all extremities  Neurologic Exam Mental Status: Awake and fully alert. Oriented to place and time. Recent and remote memory intact. Attention span, concentration and fund of knowledge appropriate. Mood and affect appropriate.  Cranial Nerves: Fundoscopic exam reveals sharp disc margins. Pupils equal, briskly reactive to light. Extraocular movements full without nystagmus. Visual fields full to confrontation. Hearing diminished bilaterally facial sensation intact. Face, tongue, palate moves normally and symmetrically.  Motor: 4/5 weakness proximally in the right lower extremity and 3/5 distally.  Tone is increased in the right leg with spasticity.  Diminished fine finger movements on the right.  Orbits left over right upper extremity.  Trace weakness of right grip.   Sensory.: intact to touch , pinprick , position and vibratory sensation.  Coordination: Rapid alternating movements normal in all extremities. Finger-to-nose performed accurately bilaterally.  Mildly impaired right knee to heel coordination. Gait and Station: Deferred as patient did not bring her walker and is one-person assist to walk  reflexes: 2+ and asymmetric and brisker on the right. Toes downgoing.   NIHSS  2 Modified Rankin  4   ASSESSMENT: 88 year old pleasant Caucasian lady with left thalamic intracerebral hemorrhage in May 2023 related to Eliquis anticoagulation for atrial fibrillation.  Patient is doing reasonably well but does have residual right leg weakness and gait and balance difficulties.  She also has age-appropriate mild cognitive impairment.     PLAN:I had a long discussion with the patient, her son and daughter regarding her recent intracerebral hemorrhage on Eliquis anticoagulation for  chronic A-fib.  She is presently off anticoagulation but remains at risk for recurrent thromboembolism and stroke.  Recommend she at least start aspirin for now and we had a long discussion about risk benefit of restarting anticoagulation with Eliquis and lack of data suggesting switching to alternative agents necessarily being safer and better.  We also discussed briefly possible consideration for place patient in the ASPIRE stroke prevention study (aspirin versus Eliquis) for  patients with intracerebral hemorrhage and A-fib.  The patient and family want more time to discuss these alternatives and make a definitive decision.  I also recommend she get more physical occupational therapy and learn to be able to walk with a walker independently.  She also has age-appropriate mild cognitive impairment I recommend she increase participation in cognitively challenging activities like solving crossword puzzles, playing bridge and sudoku.  We also discussed memory compensation strategies.  She will return follow-up in the future in 6 months or call earlier if necessary.  Greater than 50% time during this 45-minute consultation visit was spent on counseling and coordination of care about her intracerebral hemorrhage and atrial fibrillation and discussion about risk benefit of resuming Eliquis and answering questions Antony Contras, MD  Note: This document was prepared with digital dictation and possible smart phrase technology. Any transcriptional errors that result from this process are unintentional.

## 2022-07-18 NOTE — Patient Instructions (Signed)
I had a long discussion with the patient, her son and daughter regarding her recent intracerebral hemorrhage on Eliquis anticoagulation for chronic A-fib.  She is presently off anticoagulation but remains at risk for recurrent thromboembolism and stroke.  Recommend she at least start aspirin for now and we had a long discussion about risk benefit of restarting anticoagulation with Eliquis and lack of data suggesting switching to alternative agents necessarily being safer and better.  We also discussed briefly possible consideration for place patient in the ASPIRE stroke prevention study (aspirin versus Eliquis) for patients with intracerebral hemorrhage and A-fib.  The patient and family want more time to discuss these alternatives and make a definitive decision.  I also recommend she get more physical occupational therapy and learn to be able to walk with a walker independently.  She also has age-appropriate mild cognitive impairment I recommend she increase participation in cognitively challenging activities like solving crossword puzzles, playing bridge and sudoku.  We also discussed memory compensation strategies.  She will return follow-up in the future in 6 months or call earlier if necessary.  Intracerebral Hemorrhage  An intracerebral hemorrhage occurs when a blood vessel in the brain leaks or bursts (ruptures). As a result, that area of the brain becomes damaged. This is due to a lack of blood and oxygen, and from leaked blood that pools and presses on brain tissue. This is also called a bleeding (hemorrhagic) stroke. A stroke is a medical emergency. It must be treated right away. Early treatment will increase the chances of a better recovery. Delay may lead to permanent loss of brain function. What are the causes? This condition may be caused by: Head injury (trauma). A bulging of a weak section in a blood vessel (aneurysm). Thin and hardened blood vessels due to plaque buildup on the walls of an  artery. Tangled blood vessels in the brain (arteriovenous malformation). A brain tumor. Protein buildup on the artery walls of the brain (amyloid angiopathy). Sometimes, the cause of this condition is not known. What increases the risk? You are more likely to develop this condition if you: Have high blood pressure (hypertension). Are an older adult. Abuse alcohol or drugs. Take blood-thinning medicines (anticoagulants). What are the signs or symptoms? Symptoms of this condition include: The sudden onset of: Weakness or numbness of the face, arm, or leg, especially on one side of the body. Nausea and vomiting. Trouble speaking or understanding speech. Trouble seeing in one or both eyes. Difficulty walking or moving the arms or legs. Dizziness, or loss of balance or coordination. Confusion. Headache. Seizures. How is this diagnosed? This condition may be diagnosed based on: Your symptoms, medical history, and a physical exam. Tests, including: CT scan or MRI to view the brain. Computed tomography angiography (CTA) or a magnetic resonance angiogram (MRA) to view vessels in the brain. How is this treated? The goals of treatment are to stop the bleeding, control pressure in the brain, and relieve symptoms. Treatment may include: Medicines to treat symptoms, such as hypertension, pain, nausea, and vomiting. Other medicines, blood products, or vitamin K to control the bleeding. A tube (shunt) in the brain to relieve pressure. Assisted breathing (ventilation). Surgery to stop bleeding, remove a blood clot or tumor, and reduce pressure. Surgeries may include: Craniotomy. This temporarily removes part of the skull in order to reduce pressure in the brain. Stereotactic aspiration. This uses a needle or syringe to remove blood from the brain. Follow these instructions at home: Medicines Take over-the-counter and  prescription medicines only as told by your health care provider. Do not  take medicines, such as aspirin and ibuprofen, unless your health care provider tells you to take them. These medicines can thin your blood and increase the risk of bleeding. Eating and drinking Eat healthy foods as directed by your health care provider. You may be asked to: Eat a diet that is low in salt (sodium), saturated fat, trans fat, and cholesterol to manage your blood pressure. Seek the help of specialists if your ability to swallow safely has been affected. A dietitian, speech-language specialists, and an occupational therapist can teach you how to get the nutrition you need. If you drink alcohol: Limit how much you use to: 0-1 drink a day for women who are not pregnant. 0-2 drinks a day for men. Know how much alcohol is in your drink. In the [place] one drink equals one 12 oz bottle of beer (355 mL), one 5 oz glass of wine (148 mL), or one 1 oz glass of hard liquor (44 mL). Lifestyle Do not use any products that contain nicotine or tobacco. These include cigarettes, chewing tobacco, and vaping devices, such as e-cigarettes. If you need help quitting, ask your health care provider. Follow your health care provider's instructions about preventing falls. Your health care provider may: Arrange for specialists to evaluate your home. Recommend that you install grab bars in the bedroom and bathroom. Arrange for special equipment to be used at home, such as a raised toilet and a seat for the shower. Use a walker or a cane at all times if directed by your health care provider. Do physical activities as told by your health care provider. Ask your health care provider what activities are safe for you. General instructions Follow instructions to manage any other health conditions you may have, including high blood pressure, diabetes, and losing weight if needed. Keep all follow-up visits. This is important. Visits include referrals for physical and occupational therapy, rehabilitation, and lab  tests. Get help right away if:  You have any symptoms of a stroke. "BE FAST" is an easy way to remember the main warning signs of a stroke: B - Balance. Signs are dizziness, sudden trouble walking, or loss of balance. E - Eyes. Signs are trouble seeing or a sudden change in vision. F - Face. Signs are sudden weakness or numbness of the face, or the face or eyelid drooping on one side. A - Arms. Signs are weakness or numbness in an arm. This happens suddenly and usually on one side of the body. S - Speech. Signs are sudden trouble speaking, slurred speech, or trouble understanding what people say. T - Time. Time to call emergency services. Write down what time symptoms started. You have other signs of a stroke, such as: A sudden, severe headache with no known cause. Nausea or vomiting. Seizure. You have a partial or total loss of consciousness. Confusion. These symptoms may represent a serious problem that is an emergency. Do not wait to see if the symptoms will go away. Get medical help right away. Call your local emergency services (911 in the [place] Do not drive yourself to the hospital. Summary An intracerebral hemorrhage occurs when a blood vessel in the brain leaks or bursts. This is a medical emergency. Early treatment will increase the chances of a better recovery. Delay may lead to permanent damage and loss of brain function. The goals of treatment are to stop the bleeding, control pressure in the brain, and relieve  symptoms. Keep all follow-up visits. This is important. Visits include referrals for physical and occupational therapy, rehabilitation, and lab tests. This information is not intended to replace advice given to you by your health care provider. Make sure you discuss any questions you have with your health care provider. Document Revised: 08/01/2020 Document Reviewed: 08/01/2020 Elsevier Patient Education  [place].

## 2022-07-24 DIAGNOSIS — H4051X2 Glaucoma secondary to other eye disorders, right eye, moderate stage: Secondary | ICD-10-CM | POA: Diagnosis not present

## 2022-08-08 DIAGNOSIS — M25562 Pain in left knee: Secondary | ICD-10-CM | POA: Diagnosis not present

## 2022-08-08 DIAGNOSIS — R202 Paresthesia of skin: Secondary | ICD-10-CM | POA: Diagnosis not present

## 2022-08-08 DIAGNOSIS — M1712 Unilateral primary osteoarthritis, left knee: Secondary | ICD-10-CM | POA: Diagnosis not present

## 2022-08-09 DIAGNOSIS — E119 Type 2 diabetes mellitus without complications: Secondary | ICD-10-CM | POA: Diagnosis not present

## 2022-08-13 DIAGNOSIS — M1712 Unilateral primary osteoarthritis, left knee: Secondary | ICD-10-CM | POA: Diagnosis not present

## 2022-08-13 DIAGNOSIS — D649 Anemia, unspecified: Secondary | ICD-10-CM | POA: Diagnosis not present

## 2022-08-13 DIAGNOSIS — N1832 Chronic kidney disease, stage 3b: Secondary | ICD-10-CM | POA: Diagnosis not present

## 2022-08-13 DIAGNOSIS — M25552 Pain in left hip: Secondary | ICD-10-CM | POA: Diagnosis not present

## 2022-08-14 ENCOUNTER — Encounter: Payer: Medicare PPO | Attending: Registered Nurse | Admitting: Physical Medicine & Rehabilitation

## 2022-08-14 ENCOUNTER — Encounter: Payer: Self-pay | Admitting: Physical Medicine & Rehabilitation

## 2022-08-14 VITALS — BP 128/75 | HR 70 | Ht 66.0 in

## 2022-08-14 DIAGNOSIS — N39490 Overflow incontinence: Secondary | ICD-10-CM | POA: Diagnosis not present

## 2022-08-14 DIAGNOSIS — M1712 Unilateral primary osteoarthritis, left knee: Secondary | ICD-10-CM | POA: Insufficient documentation

## 2022-08-14 DIAGNOSIS — M25552 Pain in left hip: Secondary | ICD-10-CM | POA: Diagnosis not present

## 2022-08-14 DIAGNOSIS — G8911 Acute pain due to trauma: Secondary | ICD-10-CM | POA: Diagnosis not present

## 2022-08-14 DIAGNOSIS — R200 Anesthesia of skin: Secondary | ICD-10-CM | POA: Diagnosis not present

## 2022-08-14 DIAGNOSIS — R32 Unspecified urinary incontinence: Secondary | ICD-10-CM | POA: Insufficient documentation

## 2022-08-14 DIAGNOSIS — Z96642 Presence of left artificial hip joint: Secondary | ICD-10-CM | POA: Diagnosis not present

## 2022-08-14 DIAGNOSIS — I610 Nontraumatic intracerebral hemorrhage in hemisphere, subcortical: Secondary | ICD-10-CM | POA: Insufficient documentation

## 2022-08-14 NOTE — Progress Notes (Signed)
Subjective:    Patient ID: [name], female    DOB: October 11, 1923, 88 y.o.   MRN: [id number]  HPI  [name] is here in follow up of her left thalamic infarct. She has been working on gait and strengthening. She is ambulating with PT and has walked up to 150' at a times. She uses a RW and waist belt for those dx with rest breaks in w/c along the way.  She is happy with her general progress that has taken place over this past summer.  Her right foot has continued to be "weak" as well as numb. Her right arm has demonstrated a lot of improvement and she seems to be using it fairly functionally at this point..   About 2 weeks ago, she developed numbess along the lower half of her left leg. She doesn't recall any specific causative effect. She thought that her stationary bike could have been a contributor, but it's not likely as she believes she stopped using it before numbness started.  Along with this numbness she has experience some pain in her outer left hip as well as her left knee.  Apparently she has had problems with the knee previously and has a left hip replacement..  She denies pain in her buttock as well as as in her low back.  She has no symptoms in the left arm.  It sounds as if the physician at her facility put her on a short course of Celebrex 100 mg twice daily for 1 week.  Additionally she is complaining about cramping in the legs that seems to be happening at nighttime.  Sometimes there can be a pins and needle like sensation especially on the right side.  She remains on gabapentin for this.  She is dealing with ongoing urinary and bowel incontinence.  The bladder is the biggest problem.  We have treated her for urinary retention while in the hospital as well as a UTI.  Sounds as if she may have had another UTI at the facility.  She is remaining on Flomax but also taking Lasix as part of her blood pressure regimen.  She does have a history of congestive heart failure as well as  atrial fibrillation.  She has remained off Eliquis due to risk factors and is only on aspirin daily.   Pain Inventory Average Pain 0 Pain Right Now 0 My pain is sharp and pain when pressing on hip  In the last 24 hours, has pain interfered with the following? General activity 7 Relation with others 0 Enjoyment of life 7 What TIME of day is your pain at its worst? night Sleep (in general) Good  Pain is worse with: walking Pain improves with: rest and medication Relief from Meds: 8  Family History  Problem Relation Age of Onset   Cancer Mother        BREAST   COPD Father    Emphysema Father    Cancer Daughter        breast   Social History   Socioeconomic History   Marital status: Widowed    Spouse name: Not on file   Number of children: Not on file   Years of education: Not on file   Highest education level: Not on file  Occupational History   Occupation: retired  Tobacco Use   Smoking status: Never   Smokeless tobacco: Never  Vaping Use   Vaping Use: Never used  Substance and Sexual Activity   Alcohol use: No  Drug use: No   Sexual activity: Never  Other Topics Concern   Not on file  Social History Narrative   Lots of family nearby   She has an aide 4 days per week   Social Determinants of Health   Financial Resource Strain: Low Risk  (12/27/2021)   Overall Financial Resource Strain (CARDIA)    Difficulty of Paying Living Expenses: Not hard at all  Food Insecurity: No Food Insecurity (12/27/2021)   Hunger Vital Sign    Worried About Running Out of Food in the Last Year: Never true    Ran Out of Food in the Last Year: Never true  Transportation Needs: No Transportation Needs (12/27/2021)   PRAPARE - Hydrologist (Medical): No    Lack of Transportation (Non-Medical): No  Physical Activity: Insufficiently Active (12/27/2021)   Exercise Vital Sign    Days of Exercise per Week: 7 days    Minutes of Exercise per Session: 20 min   Stress: No Stress Concern Present (12/27/2021)   [place]    Feeling of Stress : Not at all  Social Connections: Moderately Integrated (12/27/2021)   Social Connection and Isolation Panel [NHANES]    Frequency of Communication with Friends and Family: More than three times a week    Frequency of Social Gatherings with Friends and Family: Once a week    Attends Religious Services: 1 to 4 times per year    Active Member of [employer] or Organizations: Yes    Attends Archivist Meetings: 1 to 4 times per year    Marital Status: Widowed   Past Surgical History:  Procedure Laterality Date   ABDOMINAL HYSTERECTOMY     APPENDECTOMY     BACK SURGERY     BREAST SURGERY     left breast biopsy   CARDIOVERSION N/A 05/09/2020   Procedure: CARDIOVERSION;  Surgeon: Skeet Latch, MD;  Location: [place];  Service: Cardiovascular;  Laterality: N/A;   COLOSTOMY N/A 01/06/2020   Procedure: Colostomy;  Surgeon: Greer Pickerel, MD;  Location: [place];  Service: General;  Laterality: N/A;   EYE SURGERY     cataract   HARDWARE REMOVAL Left 01/01/2020   Procedure: HARDWARE REMOVAL;  Surgeon: Iran Planas, MD;  Location: [place];  Service: Orthopedics;  Laterality: Left;   HIP ARTHROPLASTY Left 12/03/2020   Procedure: ARTHROPLASTY BIPOLAR HIP (HEMIARTHROPLASTY);  Surgeon: Renette Butters, MD;  Location: [place];  Service: Orthopedics;  Laterality: Left;   HIP ARTHROPLASTY Left    partial   IR KYPHO EA ADDL LEVEL THORACIC OR LUMBAR  06/27/2021   IR RADIOLOGIST EVAL & MGMT  07/13/2021   LAPAROTOMY N/A 01/06/2020   Procedure: Exploratory Laparotomy, Open Sigmoid colectomy;  Surgeon: Greer Pickerel, MD;  Location: [place];  Service: General;  Laterality: N/A;   LIPOMA EXCISION Left 04/25/2015   Procedure: EXCISION OF LIPOMA LEFT ARM;  Surgeon: Donnie Mesa, MD;  Location: [place];  Service: General;  Laterality: Left;   LUMBAR  LAMINECTOMY/DECOMPRESSION MICRODISCECTOMY  11/20/2011   Procedure: LUMBAR LAMINECTOMY/DECOMPRESSION MICRODISCECTOMY;  Surgeon: Laurice Record [place];  Location: [facility];  Service: Orthopedics;  Laterality: N/A;  Decompressive Laminectomy L2 to the Sacrum (X-Ray)   LYSIS OF ADHESION N/A 01/17/2021   Procedure: RIGID PROCTOSCOPY;  Surgeon: Michael Boston, MD;  Location: [facility];  Service: General;  Laterality: N/A;   OPEN REDUCTION INTERNAL FIXATION (ORIF) DISTAL RADIAL FRACTURE Left 01/01/2020  Procedure: OPEN REDUCTION INTERNAL FIXATION (ORIF) DISTAL RADIAL FRACTURE;  Surgeon: Iran Planas, MD;  Location: [place];  Service: Orthopedics;  Laterality: Left;   OTHER SURGICAL HISTORY     left wrist surgery - has plate in left wrist    OTHER SURGICAL HISTORY     right knee surgery due to torn cartilage    TONSILLECTOMY     WRIST FRACTURE SURGERY Left    XI ROBOTIC ASSISTED COLOSTOMY TAKEDOWN N/A 01/17/2021   Procedure: XI ROBOTIC ASSISTED OSTOMY TAKEDOWN, LYSIS OF ADHESIONS, RECTOSIGMOID RESECTION, BILATERAL TAP BLOCK;  Surgeon: Michael Boston, MD;  Location: [facility];  Service: General;  Laterality: N/A;   Past Surgical History:  Procedure Laterality Date   ABDOMINAL HYSTERECTOMY     APPENDECTOMY     BACK SURGERY     BREAST SURGERY     left breast biopsy   CARDIOVERSION N/A 05/09/2020   Procedure: CARDIOVERSION;  Surgeon: Skeet Latch, MD;  Location[facility];  Service: Cardiovascular;  Laterality: N/A;   COLOSTOMY N/A 01/06/2020   Procedure: Colostomy;  Surgeon: Greer Pickerel, MD;  Location: [place];  Service: General;  Laterality: N/A;   EYE SURGERY     cataract   HARDWARE REMOVAL Left 01/01/2020   Procedure: HARDWARE REMOVAL;  Surgeon: Iran Planas, MD;  Location: [place];  Service: Orthopedics;  Laterality: Left;   HIP ARTHROPLASTY Left 12/03/2020   Procedure: ARTHROPLASTY BIPOLAR HIP (HEMIARTHROPLASTY);  Surgeon: Renette Butters, MD;  Location: [place];  Service: Orthopedics;  Laterality: Left;    HIP ARTHROPLASTY Left    partial   IR KYPHO EA ADDL LEVEL THORACIC OR LUMBAR  06/27/2021   IR RADIOLOGIST EVAL & MGMT  07/13/2021   LAPAROTOMY N/A 01/06/2020   Procedure: Exploratory Laparotomy, Open Sigmoid colectomy;  Surgeon: Greer Pickerel, MD;  Location: [place];  Service: General;  Laterality: N/A;   LIPOMA EXCISION Left 04/25/2015   Procedure: EXCISION OF LIPOMA LEFT ARM;  Surgeon: Donnie Mesa, MD;  Location: [employer] [facility];  Service: General;  Laterality: Left;   LUMBAR LAMINECTOMY/DECOMPRESSION MICRODISCECTOMY  11/20/2011   Procedure: LUMBAR LAMINECTOMY/DECOMPRESSION MICRODISCECTOMY;  Surgeon: Laurice Record [place];  Location: [facility];  Service: Orthopedics;  Laterality: N/A;  Decompressive Laminectomy L2 to the Sacrum (X-Ray)   LYSIS OF ADHESION N/A 01/17/2021   Procedure: RIGID PROCTOSCOPY;  Surgeon: Michael Boston, MD;  Location[facility];  Service: General;  Laterality: N/A;   OPEN REDUCTION INTERNAL FIXATION (ORIF) DISTAL RADIAL FRACTURE Left 01/01/2020   Procedure: OPEN REDUCTION INTERNAL FIXATION (ORIF) DISTAL RADIAL FRACTURE;  Surgeon: Iran Planas, MD;  Location: [place];  Service: Orthopedics;  Laterality: Left;   OTHER SURGICAL HISTORY     left wrist surgery - has plate in left wrist    OTHER SURGICAL HISTORY     right knee surgery due to torn cartilage    TONSILLECTOMY     WRIST FRACTURE SURGERY Left    XI ROBOTIC ASSISTED COLOSTOMY TAKEDOWN N/A 01/17/2021   Procedure: XI ROBOTIC ASSISTED OSTOMY TAKEDOWN, LYSIS OF ADHESIONS, RECTOSIGMOID RESECTION, BILATERAL TAP BLOCK;  Surgeon: Michael Boston, MD;  Location[facility];  Service: General;  Laterality: N/A;   Past Medical History:  Diagnosis Date   Closed fracture of left distal radius 12/29/2019   DVT (deep venous thrombosis) ([place]) 09/2015   RLE   Dysrhythmia    Afib   GERD (gastroesophageal reflux disease)    Hip fracture ([place]) 12/02/2020   Hypertension    Hypothyroidism    Melanoma ([place])  06/16/2017   Facial melanoma, removed  by Dr. Harvel Quale   Peripheral vascular disease [facility])    peripheral neuropathy   BP 128/75   Pulse 70   Ht '5\' 6"'$  (1.676 m)   SpO2 97%   BMI 16.95 kg/m   Opioid Risk Score:   Fall Risk Score:  `1  Depression screen [facility] 2/9     08/14/2022    3:27 PM 05/17/2022   11:30 AM 03/12/2022    2:02 PM 12/27/2021   12:54 PM 10/24/2021    1:37 PM 04/19/2021   10:31 AM 02/01/2021   12:49 PM  Depression screen PHQ 2/9  Decreased Interest 0 1 0 1 1 0 0  Down, Depressed, Hopeless 0 1 0 1 1 0 0  PHQ - 2 Score 0 2 0 2 2 0 0  Altered sleeping  0 0 0 0 0 0  Tired, decreased energy  1 0 1 1 0 0  Change in appetite  0 0 0 0 0 0  Feeling bad or failure about yourself   2 0 0 0 0 0  Trouble concentrating  1 0 0 1 0 0  Moving slowly or fidgety/restless  0 0 0 0 0 0  Suicidal thoughts   0 0 0 0 0  PHQ-9 Score  6 0 3 4 0 0  Difficult doing work/chores   Not difficult at all Somewhat difficult Somewhat difficult Not difficult at all Not difficult at all     Review of Systems  Constitutional: Negative.   HENT: Negative.    Eyes: Negative.   Respiratory: Negative.    Cardiovascular: Negative.   Gastrointestinal: Negative.   Endocrine: Negative.   Genitourinary: Negative.   Musculoskeletal:  Positive for gait problem.  Skin: Negative.   Allergic/Immunologic: Negative.   Neurological:  Positive for numbness.  Hematological: Negative.   Psychiatric/Behavioral: Negative.        Objective:   Physical Exam  General: Alert and oriented x 3, No apparent distress HEENT: Head is normocephalic, atraumatic, PERRLA, EOMI, sclera anicteric, oral mucosa pink and moist, dentition intact, ext ear canals clear,  Neck: Supple without JVD or lymphadenopathy Heart: Reg rate and rhythm. No murmurs rubs or gallops Chest: CTA bilaterally without wheezes, rales, or rhonchi; no distress Abdomen: Soft, non-tender, non-distended, bowel sounds positive. Extremities: No clubbing, cyanosis, or edema. Pulses are 2+ Psych:  Pt's affect is appropriate. Pt is cooperative.  She is slightly anxious but pleasant Skin: Clean and intact without signs of breakdown Neuro:  Alert and oriented x 3. Normal insight and awareness. Intact Memory. Normal language and speech. Cranial nerve exam unremarkable. RUE 4-5/5, RLE 4/5 prox to distal. LUE and LLE 4-5/5. Decreased LT/pain in RLE tr/2 and in LLE below knee 1 to 1+2. DTR's 1+.  Musculoskeletal: mild pain with palpation in the left greater trochanteric area as well as along the lateral joint line of the knee, especially with medial rotation of the knee.  There effusion is notable along the left knee I did not test the patient in gait today.       Assessment & Plan:  Medical Problem List and Plan: 1. Functional deficits secondary to left thalamic intracranial hemorrhage             -She has made nice gains over the summer, certainly with her balance and gait as well as stamina.  -Continue with therapy at facility 2.  Left leg numbness and pain: -Left knee may be playing a role  as apparently she had problems with this before and I am sure this has been her dominant side for gait.  Left hip also bit tender as well -celebrex per primary but I certainly would not extend this past a week -Might benefit from a neoprene knee sleeve for support of the left knee -Needs to balance out her weight during gait.  A right AFO should help provide the right side more support so that she can accomplish this -The other question here regarding the left leg is that she could have an undiagnosed peripheral neuropathy which is hidden by the fact that the right leg has persistent sensory deficits from her thalamic hemorrhage. However in looking back at her chart, there is discussion about this left leg numbness by her PCP which has been attributed to an old back surgery/radiculopathy. It may be now that she has a new perception or awareness of the symptoms rather than new actual symptoms. (Discussed this  with daughter)  -I would observe for now.  Could consider nerve conduction EMG if patient would like to pursue. Family will contact me if they want to pursue prior to her f/u visit with me 3.  Hypertension: per primary    -May want to reconsider Lasix given her bladder issues 4: pAF: Per primary and cardiology   5: Hypothyroidism: continue Synthroid 6: Urinary retention: continue Flomax             -Patient now with incontinence.  We do not know if she is retaining or not.  -Recommend urinalysis and culture to see if she has a UTI  -If symptoms persist, consider urology referral for assessment/UDS   7:  Chronic kidney disease: Be careful with Celebrex [id number]: Polymyalgia rheumatica: prednisone 2 mg with lunch 8. Dysphagia:               -Resolved  . 9. macular degeneration of the right eye,   .  Thirty minutes + of face to face patient care time were spent during this visit. All questions were encouraged and answered. Follow up with me in 3 mos.

## 2022-08-14 NOTE — Patient Instructions (Signed)
PLEASE FEEL FREE TO CALL OUR OFFICE WITH ANY PROBLEMS OR QUESTIONS ([phone])      

## 2022-08-27 DIAGNOSIS — M25512 Pain in left shoulder: Secondary | ICD-10-CM | POA: Diagnosis not present

## 2022-09-03 DIAGNOSIS — Z87440 Personal history of urinary (tract) infections: Secondary | ICD-10-CM | POA: Diagnosis not present

## 2022-09-03 DIAGNOSIS — M25512 Pain in left shoulder: Secondary | ICD-10-CM | POA: Diagnosis not present

## 2022-09-03 DIAGNOSIS — S81012A Laceration without foreign body, left knee, initial encounter: Secondary | ICD-10-CM | POA: Diagnosis not present

## 2022-09-04 DIAGNOSIS — N390 Urinary tract infection, site not specified: Secondary | ICD-10-CM | POA: Diagnosis not present

## 2022-09-04 DIAGNOSIS — I10 Essential (primary) hypertension: Secondary | ICD-10-CM | POA: Diagnosis not present

## 2022-09-04 DIAGNOSIS — M19012 Primary osteoarthritis, left shoulder: Secondary | ICD-10-CM | POA: Diagnosis not present

## 2022-09-04 DIAGNOSIS — M79622 Pain in left upper arm: Secondary | ICD-10-CM | POA: Diagnosis not present

## 2022-09-04 DIAGNOSIS — M79602 Pain in left arm: Secondary | ICD-10-CM | POA: Diagnosis not present

## 2022-09-04 DIAGNOSIS — D649 Anemia, unspecified: Secondary | ICD-10-CM | POA: Diagnosis not present

## 2022-09-04 DIAGNOSIS — E039 Hypothyroidism, unspecified: Secondary | ICD-10-CM | POA: Diagnosis not present

## 2022-09-04 DIAGNOSIS — E46 Unspecified protein-calorie malnutrition: Secondary | ICD-10-CM | POA: Diagnosis not present

## 2022-09-05 DIAGNOSIS — N390 Urinary tract infection, site not specified: Secondary | ICD-10-CM | POA: Diagnosis not present

## 2022-09-16 DIAGNOSIS — U071 COVID-19: Secondary | ICD-10-CM | POA: Diagnosis not present

## 2022-09-17 DIAGNOSIS — U071 COVID-19: Secondary | ICD-10-CM | POA: Diagnosis not present

## 2022-09-17 DIAGNOSIS — R410 Disorientation, unspecified: Secondary | ICD-10-CM | POA: Diagnosis not present

## 2022-09-17 DIAGNOSIS — Z87440 Personal history of urinary (tract) infections: Secondary | ICD-10-CM | POA: Diagnosis not present

## 2022-09-17 DIAGNOSIS — E860 Dehydration: Secondary | ICD-10-CM | POA: Diagnosis not present

## 2022-09-18 DIAGNOSIS — N390 Urinary tract infection, site not specified: Secondary | ICD-10-CM | POA: Diagnosis not present

## 2022-09-18 DIAGNOSIS — U071 COVID-19: Secondary | ICD-10-CM | POA: Diagnosis not present

## 2022-09-18 DIAGNOSIS — N1832 Chronic kidney disease, stage 3b: Secondary | ICD-10-CM | POA: Diagnosis not present

## 2022-09-18 DIAGNOSIS — D649 Anemia, unspecified: Secondary | ICD-10-CM | POA: Diagnosis not present

## 2022-09-20 DIAGNOSIS — Z87440 Personal history of urinary (tract) infections: Secondary | ICD-10-CM | POA: Diagnosis not present

## 2022-09-20 DIAGNOSIS — Z7689 Persons encountering health services in other specified circumstances: Secondary | ICD-10-CM | POA: Diagnosis not present

## 2022-10-06 DIAGNOSIS — M1711 Unilateral primary osteoarthritis, right knee: Secondary | ICD-10-CM | POA: Diagnosis not present

## 2022-10-06 DIAGNOSIS — M25461 Effusion, right knee: Secondary | ICD-10-CM | POA: Diagnosis not present

## 2022-10-06 DIAGNOSIS — M1611 Unilateral primary osteoarthritis, right hip: Secondary | ICD-10-CM | POA: Diagnosis not present

## 2022-10-07 DIAGNOSIS — M1611 Unilateral primary osteoarthritis, right hip: Secondary | ICD-10-CM | POA: Diagnosis not present

## 2022-10-07 DIAGNOSIS — M1711 Unilateral primary osteoarthritis, right knee: Secondary | ICD-10-CM | POA: Diagnosis not present

## 2022-10-10 DIAGNOSIS — N3281 Overactive bladder: Secondary | ICD-10-CM | POA: Diagnosis not present

## 2022-10-10 DIAGNOSIS — Z87440 Personal history of urinary (tract) infections: Secondary | ICD-10-CM | POA: Diagnosis not present

## 2022-10-14 DIAGNOSIS — R339 Retention of urine, unspecified: Secondary | ICD-10-CM | POA: Diagnosis not present

## 2022-10-14 DIAGNOSIS — I619 Nontraumatic intracerebral hemorrhage, unspecified: Secondary | ICD-10-CM | POA: Diagnosis not present

## 2022-10-15 DIAGNOSIS — Z7689 Persons encountering health services in other specified circumstances: Secondary | ICD-10-CM | POA: Diagnosis not present

## 2022-10-15 DIAGNOSIS — N390 Urinary tract infection, site not specified: Secondary | ICD-10-CM | POA: Diagnosis not present

## 2022-10-23 DIAGNOSIS — H353132 Nonexudative age-related macular degeneration, bilateral, intermediate dry stage: Secondary | ICD-10-CM | POA: Diagnosis not present

## 2022-10-23 DIAGNOSIS — H34811 Central retinal vein occlusion, right eye, with macular edema: Secondary | ICD-10-CM | POA: Diagnosis not present

## 2022-10-23 DIAGNOSIS — H4051X2 Glaucoma secondary to other eye disorders, right eye, moderate stage: Secondary | ICD-10-CM | POA: Diagnosis not present

## 2022-10-25 DIAGNOSIS — Z87440 Personal history of urinary (tract) infections: Secondary | ICD-10-CM | POA: Diagnosis not present

## 2022-10-25 DIAGNOSIS — N3281 Overactive bladder: Secondary | ICD-10-CM | POA: Diagnosis not present

## 2022-11-09 DIAGNOSIS — N390 Urinary tract infection, site not specified: Secondary | ICD-10-CM | POA: Diagnosis not present

## 2022-11-12 DIAGNOSIS — R339 Retention of urine, unspecified: Secondary | ICD-10-CM | POA: Diagnosis not present

## 2022-11-12 DIAGNOSIS — N390 Urinary tract infection, site not specified: Secondary | ICD-10-CM | POA: Diagnosis not present

## 2022-11-13 DIAGNOSIS — N390 Urinary tract infection, site not specified: Secondary | ICD-10-CM | POA: Diagnosis not present

## 2022-11-13 DIAGNOSIS — R350 Frequency of micturition: Secondary | ICD-10-CM | POA: Diagnosis not present

## 2022-11-13 DIAGNOSIS — R351 Nocturia: Secondary | ICD-10-CM | POA: Diagnosis not present

## 2022-11-15 DIAGNOSIS — Z79899 Other long term (current) drug therapy: Secondary | ICD-10-CM | POA: Diagnosis not present

## 2022-11-15 DIAGNOSIS — I10 Essential (primary) hypertension: Secondary | ICD-10-CM | POA: Diagnosis not present

## 2022-11-18 DIAGNOSIS — Z87440 Personal history of urinary (tract) infections: Secondary | ICD-10-CM | POA: Diagnosis not present

## 2022-11-18 DIAGNOSIS — D649 Anemia, unspecified: Secondary | ICD-10-CM | POA: Diagnosis not present

## 2022-11-18 DIAGNOSIS — N1832 Chronic kidney disease, stage 3b: Secondary | ICD-10-CM | POA: Diagnosis not present

## 2022-11-20 ENCOUNTER — Ambulatory Visit: Payer: Medicare PPO | Admitting: Physical Medicine & Rehabilitation

## 2022-11-20 DIAGNOSIS — E46 Unspecified protein-calorie malnutrition: Secondary | ICD-10-CM | POA: Diagnosis not present

## 2022-11-20 DIAGNOSIS — E039 Hypothyroidism, unspecified: Secondary | ICD-10-CM | POA: Diagnosis not present

## 2022-11-20 DIAGNOSIS — I10 Essential (primary) hypertension: Secondary | ICD-10-CM | POA: Diagnosis not present

## 2022-11-20 DIAGNOSIS — D649 Anemia, unspecified: Secondary | ICD-10-CM | POA: Diagnosis not present

## 2022-11-26 DIAGNOSIS — E46 Unspecified protein-calorie malnutrition: Secondary | ICD-10-CM | POA: Diagnosis not present

## 2022-11-26 DIAGNOSIS — D649 Anemia, unspecified: Secondary | ICD-10-CM | POA: Diagnosis not present

## 2022-11-26 DIAGNOSIS — E039 Hypothyroidism, unspecified: Secondary | ICD-10-CM | POA: Diagnosis not present

## 2022-11-26 DIAGNOSIS — S81011D Laceration without foreign body, right knee, subsequent encounter: Secondary | ICD-10-CM | POA: Diagnosis not present

## 2022-12-04 DIAGNOSIS — E46 Unspecified protein-calorie malnutrition: Secondary | ICD-10-CM | POA: Diagnosis not present

## 2022-12-04 DIAGNOSIS — D649 Anemia, unspecified: Secondary | ICD-10-CM | POA: Diagnosis not present

## 2022-12-04 DIAGNOSIS — E039 Hypothyroidism, unspecified: Secondary | ICD-10-CM | POA: Diagnosis not present

## 2022-12-04 DIAGNOSIS — I10 Essential (primary) hypertension: Secondary | ICD-10-CM | POA: Diagnosis not present

## 2022-12-10 DIAGNOSIS — I10 Essential (primary) hypertension: Secondary | ICD-10-CM | POA: Diagnosis not present

## 2022-12-10 DIAGNOSIS — E039 Hypothyroidism, unspecified: Secondary | ICD-10-CM | POA: Diagnosis not present

## 2022-12-10 DIAGNOSIS — D649 Anemia, unspecified: Secondary | ICD-10-CM | POA: Diagnosis not present

## 2022-12-10 DIAGNOSIS — E46 Unspecified protein-calorie malnutrition: Secondary | ICD-10-CM | POA: Diagnosis not present

## 2022-12-25 DIAGNOSIS — S81812A Laceration without foreign body, left lower leg, initial encounter: Secondary | ICD-10-CM | POA: Diagnosis not present

## 2022-12-25 DIAGNOSIS — D649 Anemia, unspecified: Secondary | ICD-10-CM | POA: Diagnosis not present

## 2022-12-25 DIAGNOSIS — E039 Hypothyroidism, unspecified: Secondary | ICD-10-CM | POA: Diagnosis not present

## 2022-12-25 DIAGNOSIS — E46 Unspecified protein-calorie malnutrition: Secondary | ICD-10-CM | POA: Diagnosis not present

## 2022-12-26 DIAGNOSIS — G501 Atypical facial pain: Secondary | ICD-10-CM | POA: Diagnosis not present

## 2022-12-26 DIAGNOSIS — R519 Headache, unspecified: Secondary | ICD-10-CM | POA: Diagnosis not present

## 2022-12-26 DIAGNOSIS — R6884 Jaw pain: Secondary | ICD-10-CM | POA: Diagnosis not present

## 2022-12-27 DIAGNOSIS — E7800 Pure hypercholesterolemia, unspecified: Secondary | ICD-10-CM | POA: Diagnosis not present

## 2022-12-27 DIAGNOSIS — R51 Headache with orthostatic component, not elsewhere classified: Secondary | ICD-10-CM | POA: Diagnosis not present

## 2022-12-30 DIAGNOSIS — Z7689 Persons encountering health services in other specified circumstances: Secondary | ICD-10-CM | POA: Diagnosis not present

## 2022-12-31 DIAGNOSIS — H34811 Central retinal vein occlusion, right eye, with macular edema: Secondary | ICD-10-CM | POA: Diagnosis not present

## 2022-12-31 DIAGNOSIS — H4051X2 Glaucoma secondary to other eye disorders, right eye, moderate stage: Secondary | ICD-10-CM | POA: Diagnosis not present

## 2023-01-01 ENCOUNTER — Encounter: Payer: Self-pay | Admitting: Physical Medicine & Rehabilitation

## 2023-01-01 ENCOUNTER — Encounter: Payer: Medicare PPO | Attending: Registered Nurse | Admitting: Physical Medicine & Rehabilitation

## 2023-01-01 VITALS — BP 134/69 | HR 55 | Ht 66.0 in | Wt 135.0 lb

## 2023-01-01 DIAGNOSIS — I610 Nontraumatic intracerebral hemorrhage in hemisphere, subcortical: Secondary | ICD-10-CM

## 2023-01-01 DIAGNOSIS — S81812A Laceration without foreign body, left lower leg, initial encounter: Secondary | ICD-10-CM | POA: Diagnosis not present

## 2023-01-01 DIAGNOSIS — E039 Hypothyroidism, unspecified: Secondary | ICD-10-CM | POA: Diagnosis not present

## 2023-01-01 DIAGNOSIS — D649 Anemia, unspecified: Secondary | ICD-10-CM | POA: Diagnosis not present

## 2023-01-01 DIAGNOSIS — E46 Unspecified protein-calorie malnutrition: Secondary | ICD-10-CM | POA: Diagnosis not present

## 2023-01-01 NOTE — Patient Instructions (Addendum)
RIGHT FOOT UP AFO FOR AMBULATION  KEEP WALKING!   DRINK MORE IN MORNING LESS AFTER DINNER

## 2023-01-01 NOTE — Progress Notes (Signed)
Subjective:    Patient ID: [name], female    DOB: 07/27/1923, 89 y.o.   MRN: [id number]  HPI Here in follow up of left thalamic infarct. I last saw her in October. She is still at [facility] and is still working with PT. She walks with her RW most days. Her stamina is improving. She hasn't fallen but her right toes still drag. She tried an AFO but she doesn't like it because of the fit.   She is emptying her bladder better but still is up a lot at night to void.   Pt also complains of bilateral leg pain and cramping particularly at night. She did not have these sx prior to hospitalization.       Pain Inventory Average Pain 5 Pain Right Now 5 My pain is intermittent, sharp, and aching  LOCATION OF PAIN  leg, left eye  BOWEL Number of stools per week: 5 Oral laxative use Yes  Type of laxative miralax Enema or suppository use No  History of colostomy Yes  Incontinent No   BLADDER Pads In and out cath, frequency . Able to self cath  . Bladder incontinence No  Frequent urination Yes  Leakage with coughing No  Difficulty starting stream No  Incomplete bladder emptying Yes    Mobility walk with assistance use a walker how many minutes can you walk? 20 ability to climb steps?  no do you drive?  no use a wheelchair  Function retired I need assistance with the following:  bathing, meal prep, household duties, and shopping  Neuro/Psych weakness numbness tingling trouble walking  Prior Studies Any changes since last visit?  no  Physicians involved in your care Any changes since last visit?  no   Family History  Problem Relation Age of Onset   Cancer Mother        BREAST   COPD Father    Emphysema Father    Cancer Daughter        breast   Social History   Socioeconomic History   Marital status: Widowed    Spouse name: Not on file   Number of children: Not on file   Years of education: Not on file   Highest education level: Not on file   Occupational History   Occupation: retired  Tobacco Use   Smoking status: Never   Smokeless tobacco: Never  Vaping Use   Vaping Use: Never used  Substance and Sexual Activity   Alcohol use: No   Drug use: No   Sexual activity: Never  Other Topics Concern   Not on file  Social History Narrative   Lots of family nearby   She has an aide 4 days per week   Social Determinants of Health   Financial Resource Strain: Low Risk  (12/27/2021)   Overall Financial Resource Strain (CARDIA)    Difficulty of Paying Living Expenses: Not hard at all  Food Insecurity: No Food Insecurity (12/27/2021)   Hunger Vital Sign    Worried About Running Out of Food in the Last Year: Never true    [place] in the Last Year: Never true  Transportation Needs: No Transportation Needs (12/27/2021)   PRAPARE - Hydrologist (Medical): No    Lack of Transportation (Non-Medical): No  Physical Activity: Insufficiently Active (12/27/2021)   Exercise Vital Sign    Days of Exercise per Week: 7 days    Minutes of Exercise per Session: 20 min  Stress: No Stress Concern Present (12/27/2021)   [place]    Feeling of Stress : Not at all  Social Connections: Moderately Integrated (12/27/2021)   Social Connection and Isolation Panel [NHANES]    Frequency of Communication with Friends and Family: More than three times a week    Frequency of Social Gatherings with Friends and Family: Once a week    Attends Religious Services: 1 to 4 times per year    Active Member of [employer] or Organizations: Yes    Attends Archivist Meetings: 1 to 4 times per year    Marital Status: Widowed   Past Surgical History:  Procedure Laterality Date   ABDOMINAL HYSTERECTOMY     APPENDECTOMY     BACK SURGERY     BREAST SURGERY     left breast biopsy   CARDIOVERSION N/A 05/09/2020   Procedure: CARDIOVERSION;  Surgeon: Skeet Latch, MD;  Location: [place];  Service: Cardiovascular;  Laterality: N/A;   COLOSTOMY N/A 01/06/2020   Procedure: Colostomy;  Surgeon: Greer Pickerel, MD;  Location: [place];  Service: General;  Laterality: N/A;   EYE SURGERY     cataract   HARDWARE REMOVAL Left 01/01/2020   Procedure: HARDWARE REMOVAL;  Surgeon: Iran Planas, MD;  Location: [place];  Service: Orthopedics;  Laterality: Left;   HIP ARTHROPLASTY Left 12/03/2020   Procedure: ARTHROPLASTY BIPOLAR HIP (HEMIARTHROPLASTY);  Surgeon: Renette Butters, MD;  Location: [place];  Service: Orthopedics;  Laterality: Left;   HIP ARTHROPLASTY Left    partial   IR KYPHO EA ADDL LEVEL THORACIC OR LUMBAR  06/27/2021   IR RADIOLOGIST EVAL & MGMT  07/13/2021   LAPAROTOMY N/A 01/06/2020   Procedure: Exploratory Laparotomy, Open Sigmoid colectomy;  Surgeon: Greer Pickerel, MD;  Location: [place];  Service: General;  Laterality: N/A;   LIPOMA EXCISION Left 04/25/2015   Procedure: EXCISION OF LIPOMA LEFT ARM;  Surgeon: Donnie Mesa, MD;  Location: [place];  Service: General;  Laterality: Left;   LUMBAR LAMINECTOMY/DECOMPRESSION MICRODISCECTOMY  11/20/2011   Procedure: LUMBAR LAMINECTOMY/DECOMPRESSION MICRODISCECTOMY;  Surgeon: Laurice Record [place];  Location: [facility];  Service: Orthopedics;  Laterality: N/A;  Decompressive Laminectomy L2 to the Sacrum (X-Ray)   LYSIS OF ADHESION N/A 01/17/2021   Procedure: RIGID PROCTOSCOPY;  Surgeon: Michael Boston, MD;  Location: [facility];  Service: General;  Laterality: N/A;   OPEN REDUCTION INTERNAL FIXATION (ORIF) DISTAL RADIAL FRACTURE Left 01/01/2020   Procedure: OPEN REDUCTION INTERNAL FIXATION (ORIF) DISTAL RADIAL FRACTURE;  Surgeon: Iran Planas, MD;  Location: [place];  Service: Orthopedics;  Laterality: Left;   OTHER SURGICAL HISTORY     left wrist surgery - has plate in left wrist    OTHER SURGICAL HISTORY     right knee surgery due to torn cartilage    TONSILLECTOMY     WRIST FRACTURE SURGERY Left     XI ROBOTIC ASSISTED COLOSTOMY TAKEDOWN N/A 01/17/2021   Procedure: XI ROBOTIC ASSISTED OSTOMY TAKEDOWN, LYSIS OF ADHESIONS, RECTOSIGMOID RESECTION, BILATERAL TAP BLOCK;  Surgeon: Michael Boston, MD;  Location[facility];  Service: General;  Laterality: N/A;   Past Medical History:  Diagnosis Date   Closed fracture of left distal radius 12/29/2019   DVT (deep venous thrombosis) ([place]) 09/2015   RLE   Dysrhythmia    Afib   GERD (gastroesophageal reflux disease)    Hip fracture ([place]) 12/02/2020   Hypertension    Hypothyroidism  Melanoma ([place]) 06/16/2017   Facial melanoma, removed by Dr. Harvel Quale   Peripheral vascular disease [facility])    peripheral neuropathy   BP 134/69   Pulse (!) 55   Ht 5' 6"$  (1.676 m)   Wt 135 lb (61.2 kg)   SpO2 97%   BMI 21.79 kg/m   Opioid Risk Score:   Fall Risk Score:  `1  Depression screen [facility] 2/9     08/14/2022    3:27 PM 05/17/2022   11:30 AM 03/12/2022    2:02 PM 12/27/2021   12:54 PM 10/24/2021    1:37 PM 04/19/2021   10:31 AM 02/01/2021   12:49 PM  Depression screen PHQ 2/9  Decreased Interest 0 1 0 1 1 0 0  Down, Depressed, Hopeless 0 1 0 1 1 0 0  PHQ - 2 Score 0 2 0 2 2 0 0  Altered sleeping  0 0 0 0 0 0  Tired, decreased energy  1 0 1 1 0 0  Change in appetite  0 0 0 0 0 0  Feeling bad or failure about yourself   2 0 0 0 0 0  Trouble concentrating  1 0 0 1 0 0  Moving slowly or fidgety/restless  0 0 0 0 0 0  Suicidal thoughts   0 0 0 0 0  PHQ-9 Score  6 0 3 4 0 0  Difficult doing work/chores   Not difficult at all Somewhat difficult Somewhat difficult Not difficult at all Not difficult at all     Review of Systems  Gastrointestinal:  Positive for constipation.  Musculoskeletal:  Positive for gait problem.  Neurological:  Positive for weakness and numbness.  All other systems reviewed and are negative.     Objective:   Physical Exam  General: No acute distress HEENT: NCAT, EOMI, oral membranes moist Cards: reg rate  Chest: normal  effort Abdomen: Soft, NT, ND Skin: dry, intact Extremities: no edema Psych: pleasant and appropriate sitive. Extremities: No clubbing, cyanosis, or edema. Pulses are 2+ Psych: Pt's affect is appropriate. Pt is cooperative.  anxiety beteter Skin: Clean and intact without signs of breakdown Neuro:  Alert and oriented x 3. Normal insight and awareness. Intact Memory. Normal language and speech. Cranial nerve exam unremarkable. RUE 4+ 5/5, RLE 4+/5 prox to distal. LUE and LLE 4 to 5/5. Decreased LT/pain in RLE tr/2 and in LLE below knee 1 to 1+2 WITHOUT CHANGE.  DTR's 1+.  Musculoskeletal: mild left knee pain with rom bu timproved.            Assessment & Plan:  Medical Problem List and Plan: 1. Functional deficits secondary to left thalamic intracranial hemorrhage             - foot up AFO may be better tolerated and help with foot clearance             -Continue with therapy at facility. Strength is improving. Will always have fall risk d/t sensory loss 2.  Left leg numbness and pain: -chronic d/t lumbar spondylosis and radiculopathy -celebrex prn ok  -improving with better strength in RLE 3.  Hypertension: per primary              -May want to reconsider Lasix given her bladder issues 4: pAF: Per primary and cardiology   5: bilateral leg pain: hold statin for now  -check electrolytes 6: Urinary retention: continue Flomax             -  on prophylactic abx  -fluid ratioing to morning   7:  Chronic kidney disease    18: Polymyalgia rheumatica: prednisone 2 mg with lunch 8. Dysphagia:               -Resolved  . 9. macular degeneration of the right eye,   .   20 minutes of face to face patient care time were spent during this visit. All questions were encouraged and answered. Follow up with me in 3 mos.

## 2023-01-07 DIAGNOSIS — S81812A Laceration without foreign body, left lower leg, initial encounter: Secondary | ICD-10-CM | POA: Diagnosis not present

## 2023-01-07 DIAGNOSIS — D649 Anemia, unspecified: Secondary | ICD-10-CM | POA: Diagnosis not present

## 2023-01-07 DIAGNOSIS — E039 Hypothyroidism, unspecified: Secondary | ICD-10-CM | POA: Diagnosis not present

## 2023-01-07 DIAGNOSIS — E46 Unspecified protein-calorie malnutrition: Secondary | ICD-10-CM | POA: Diagnosis not present

## 2023-01-14 DIAGNOSIS — D649 Anemia, unspecified: Secondary | ICD-10-CM | POA: Diagnosis not present

## 2023-01-14 DIAGNOSIS — E039 Hypothyroidism, unspecified: Secondary | ICD-10-CM | POA: Diagnosis not present

## 2023-01-14 DIAGNOSIS — E46 Unspecified protein-calorie malnutrition: Secondary | ICD-10-CM | POA: Diagnosis not present

## 2023-01-14 DIAGNOSIS — S81812A Laceration without foreign body, left lower leg, initial encounter: Secondary | ICD-10-CM | POA: Diagnosis not present

## 2023-01-16 ENCOUNTER — Ambulatory Visit: Payer: Medicare PPO | Admitting: Neurology

## 2023-01-16 ENCOUNTER — Encounter: Payer: Self-pay | Admitting: Neurology

## 2023-01-16 VITALS — BP 141/60 | HR 58 | Ht 66.0 in | Wt 136.0 lb

## 2023-01-16 DIAGNOSIS — I482 Chronic atrial fibrillation, unspecified: Secondary | ICD-10-CM

## 2023-01-16 DIAGNOSIS — R269 Unspecified abnormalities of gait and mobility: Secondary | ICD-10-CM

## 2023-01-16 DIAGNOSIS — I619 Nontraumatic intracerebral hemorrhage, unspecified: Secondary | ICD-10-CM | POA: Diagnosis not present

## 2023-01-16 DIAGNOSIS — I69359 Hemiplegia and hemiparesis following cerebral infarction affecting unspecified side: Secondary | ICD-10-CM

## 2023-01-16 NOTE — Progress Notes (Signed)
[facility] [address] [place]. [place] [address] [phone]       OFFICE FOLLOW-UP VISIT NOTE  [name] Date of Birth:  Apr 05, 1923 Medical Record Number:  [id number]   Referring MD:   Rosalin Hawking  Reason for Referral: Intracerebral hemorrhage  HPI: Initial visit 07/18/2022 [name] is a 89 year old pleasant Caucasian lady seen today for initial office consultation visit for intracerebral hemorrhage.  She is accompanied by her son and daughter.  History is obtained from them and review of electronic medical records and o[place]sum reviewed pertinent available imaging films and PACS.  She has past medical history significant for hypertension, hypothyroidism, gastroesophageal flux disease, peripheral vascular disease, DVT, A-fib on Eliquis who presented on 03/25/2022 with sudden onset of slurred speech, aphasia and right-sided weakness.  The patient lives alone but had a life alert button which she pushed and family found her to have the above-mentioned symptoms.  CT scan of the head on admission showed a left basal ganglia intracerebral hemorrhage with volume 6 mL with trace intraventricular hemorrhage but no hydrocephalus.  NIH stroke scale on admission was 15 and modified Rankin score at baseline was 3.  ICH score was 2.  Patient was admitted to the ICU and her Eliquis was reversed with Andexxa and blood pressure was tightly controlled.  She remained stable she was transferred to inpatient rehab around 04/07/2022 she had sudden neurological worsening with decrease loss of consciousness and altered mentation.  Stat CT head was repeated which showed no acute abnormality and patient returned quickly back to her baseline.  CT angiogram of the brain and neck showed no large vessel stenosis or occlusion.  Carotid ultrasound showed no significant bilateral extracranial stenosis.  2D echo showed ejection fraction 55 to 60% with mildly dilated left atrium.  LDL cholesterol is 94 mg  percent.  Hemoglobin A1c was 5.9.  Patient was discharged on no antithrombotic agents due to intracerebral hemorrhage.  Patient has done well since discharge.  She is able to ambulate with a walker but still needs 1 person assist.  She is still getting physical occupational therapy.  Her right foot is still weak and she has trouble controlling it.  She is also has decreased sensation in the right foot.  Her speech and right upper extremity weakness and coordination have improved significantly.  She is also had short-term memory difficulties following her intracerebral hemorrhage.  She is currently at [facility] skilled nursing facility and has recently finished physical and Occupational Therapy.  Family is wondering if she needs more therapy and they are considering paying for it out-of-pocket.  They have also seen the cardiologist Dr. Caryl Comes who strongly recommended patient going back on Eliquis but family has questions and concerns about this and here to discuss options with me. Update 01/16/2023 ;  she returns for follow-up after last visit 1 year ago.  She is accompanied by her son and daughter.  [name] continues to make gradual improvement.  She is able to walk with a walker but requires 1 person assist using a strap.  She is getting physical and Occupational Therapy now but has to pay out-of-pocket.  She still has difficulty controlling the right leg which tends to deviate medially and affecting her balance.  She discontinued Lipitor as she was having muscle cramps.  She remains on aspirin which is tolerating well with minor bruising but no bleeding.  She states her blood pressure is under good control.  He has not had any  recurrent stroke or TIA symptoms.  Is unclear if she has had recent lipid profile checked.  Her short-term memory and cognitive difficulties persist but are unchanged and not progressive. ROS:   14 system review of systems is positive for weakness, numbness, easy bruisability and all other  systems negative  PMH:  Past Medical History:  Diagnosis Date   Closed fracture of left distal radius 12/29/2019   DVT (deep venous thrombosis) ([place]) 09/2015   RLE   Dysrhythmia    Afib   GERD (gastroesophageal reflux disease)    Hip fracture ([place]) 12/02/2020   Hypertension    Hypothyroidism    Melanoma ([place]) 06/16/2017   Facial melanoma, removed by Dr. Harvel Quale   Peripheral vascular disease [facility])    peripheral neuropathy    Social History:  Social History   Socioeconomic History   Marital status: Widowed    Spouse name: Not on file   Number of children: Not on file   Years of education: Not on file   Highest education level: Not on file  Occupational History   Occupation: retired  Tobacco Use   Smoking status: Never   Smokeless tobacco: Never  Vaping Use   Vaping Use: Never used  Substance and Sexual Activity   Alcohol use: No   Drug use: No   Sexual activity: Never  Other Topics Concern   Not on file  Social History Narrative   Lots of family nearby   She has an aide 4 days per week   Social Determinants of Health   Financial Resource Strain: Low Risk  (12/27/2021)   Overall Financial Resource Strain (CARDIA)    Difficulty of Paying Living Expenses: Not hard at all  Food Insecurity: No Food Insecurity (12/27/2021)   Hunger Vital Sign    Worried About Running Out of Food in the Last Year: Never true    [place] in the Last Year: Never true  Transportation Needs: No Transportation Needs (12/27/2021)   PRAPARE - Hydrologist (Medical): No    Lack of Transportation (Non-Medical): No  Physical Activity: Insufficiently Active (12/27/2021)   Exercise Vital Sign    Days of Exercise per Week: 7 days    Minutes of Exercise per Session: 20 min  Stress: No Stress Concern Present (12/27/2021)   [place]    Feeling of Stress : Not at all  Social Connections: Moderately  Integrated (12/27/2021)   Social Connection and Isolation Panel [NHANES]    Frequency of Communication with Friends and Family: More than three times a week    Frequency of Social Gatherings with Friends and Family: Once a week    Attends Religious Services: 1 to 4 times per year    Active Member of [employer] or Organizations: Yes    Attends Archivist Meetings: 1 to 4 times per year    Marital Status: Widowed  Intimate Partner Violence: Not At Risk (12/27/2021)   Humiliation, Afraid, Rape, and Kick questionnaire    Fear of Current or Ex-Partner: No    Emotionally Abused: No    Physically Abused: No    Sexually Abused: No    Medications:   Current Outpatient Medications on File Prior to Visit  Medication Sig Dispense Refill   acetaminophen (TYLENOL) 325 MG tablet Take 650 mg by mouth every 6 (six) hours as needed.     aspirin EC 81 MG tablet Take 1 tablet (81  mg total) by mouth daily. Swallow whole. 30 tablet 12   brimonidine (ALPHAGAN) 0.2 % ophthalmic solution Place 1 drop into the right eye 2 (two) times daily.     CRANBERRY-VITAMIN C PO Take 30 mLs by mouth daily.     diltiazem (CARDIZEM CD) 120 MG 24 hr capsule Take 1 capsule (120 mg total) by mouth every evening.     dorzolamide-timolol (COSOPT) 2-0.5 % ophthalmic solution 1 drop 2 (two) times daily.     gabapentin (NEURONTIN) 100 MG capsule Take 2 capsules (200 mg total) by mouth at bedtime.     hydrocortisone cream 1 % Apply 1 Application topically 2 (two) times daily.     hydroxypropyl methylcellulose / hypromellose (ISOPTO TEARS / GONIOVISC) 2.5 % ophthalmic solution 1 drop as needed for dry eyes.     levothyroxine (SYNTHROID) 50 MCG tablet Take 1 tablet (50 mcg total) by mouth daily before breakfast.     loperamide (IMODIUM A-D) 2 MG tablet Take 2 mg by mouth 4 (four) times daily as needed for diarrhea or loose stools.     melatonin 3 MG TABS tablet Take 3 mg by mouth at bedtime.     Multiple Vitamins-Minerals  (PRESERVISION AREDS 2 PO) Take 1 tablet by mouth daily with lunch.     nadolol (CORGARD) 80 MG tablet Take 1 tablet (80 mg total) by mouth daily.     nitroGLYCERIN (NITROSTAT) 0.4 MG SL tablet Place 1 tablet (0.4 mg total) under the tongue every 5 (five) minutes as needed for chest pain.  12   ondansetron (ZOFRAN) 4 MG tablet Take 4 mg by mouth every 6 (six) hours as needed for nausea or vomiting.     pantoprazole (PROTONIX) 40 MG tablet Take 1 tablet (40 mg total) by mouth daily. 90 tablet 1   Polyethylene Glycol 3350 (MIRALAX PO) Take by mouth.     polyvinyl alcohol (LIQUIFILM TEARS) 1.4 % ophthalmic solution Place 1 drop into the right eye as needed for dry eyes. 15 mL 0   predniSONE (DELTASONE) 1 MG tablet Take 2 tablets (2 mg total) by mouth daily with lunch.     trimethoprim (TRIMPEX) 100 MG tablet Take 100 mg by mouth 2 (two) times daily.     witch hazel-glycerin (TUCKS) pad Apply 1 Application topically as needed for itching.     No current facility-administered medications on file prior to visit.    Allergies:   Allergies  Allergen Reactions   Keflex [Cephalexin] Nausea Only and Other (See Comments)    Pt ended up in ER w/ CHEST PAIN.  Patient experienced chest pain again with re-challenge of cephalexin in May 2023.   Codeine Other (See Comments)    "Nightmares, imagined things"    Physical Exam General: Pleasant frail elderly Caucasian lady seated, in no evident distress Head: head normocephalic and atraumatic.   Neck: supple with no carotid or supraclavicular bruits Cardiovascular: regular rate and rhythm, no murmurs Musculoskeletal: no deformity Skin:  no rash/petichiae Vascular:  Normal pulses all extremities  Neurologic Exam Mental Status: Awake and fully alert. Oriented to place and time. Recent and remote memory intact. Attention span, concentration and fund of knowledge appropriate. Mood and affect appropriate.  Cranial Nerves: Fundoscopic exam reveals sharp disc  margins. Pupils equal, briskly reactive to light. Extraocular movements full without nystagmus. Visual fields full to confrontation. Hearing diminished bilaterally facial sensation intact. Face, tongue, palate moves normally and symmetrically.  Motor: 4/5 weakness proximally in the right lower extremity and  3/5 distally.  Tone is increased in the right leg with spasticity.  Diminished fine finger movements on the right.  Orbits left over right upper extremity.  Trace weakness of right grip.   Sensory.: intact to touch , pinprick , position and vibratory sensation.  Coordination: Rapid alternating movements normal in all extremities. Finger-to-nose performed accurately bilaterally.  Mildly impaired right knee to heel coordination. Gait and Station: Deferred as patient did not bring her walker and is one-person assist to walk  reflexes: 2+ and asymmetric and brisker on the right. Toes downgoing.   NIHSS  2 Modified Rankin  4   ASSESSMENT: 89 year old pleasant Caucasian lady with left thalamic intracerebral hemorrhage in May 2023 related to Eliquis anticoagulation for atrial fibrillation.  Patient is doing reasonably well but does have residual right leg weakness and gait and balance difficulties.  She also has age-appropriate mild cognitive impairment.     PLAN:I had a long discussion with the patient, her son and daughter regarding her recent intracerebral hemorrhage on Eliquis anticoagulation for chronic A-fib.  She is presently off anticoagulation but remains at risk for recurrent thromboembolism and stroke.  Recommend she continue aspirin for  stroke prevention  for  A-fib she does not want to go back on anticoagulation with Eliquis.  She has also discontinued Lipitor due to cramps.I also recommend she continue ongoing physical occupational therapy and learn to be able to walk with a walker independently.  She also has age-appropriate mild cognitive impairment I recommend she increase participation  in cognitively challenging activities like solving crossword puzzles, playing bridge and sudoku.  We also discussed memory compensation strategies.  She will return follow-up in the future in 6 months or call earlier if necessary.  .  Greater than 50% time during this 35-minute  visit was spent on counseling and coordination of care about her intracerebral hemorrhage and atrial fibrillation and discussion about risk benefit of resuming Eliquis and answering questions Antony Contras, MD  Note: This document was prepared with digital dictation and possible smart phrase technology. Any transcriptional errors that result from this process are unintentional.

## 2023-01-16 NOTE — Patient Instructions (Signed)
I had a long discussion with the patient, her son and daughter regarding her recent intracerebral hemorrhage on Eliquis anticoagulation for chronic A-fib.  She is presently off anticoagulation but remains at risk for recurrent thromboembolism and stroke.  Recommend she continue aspirin for  stroke prevention  for  A-fib she does not want to go back on anticoagulation with Eliquis.  She has also discontinued Lipitor due to cramps.I also recommend she continue ongoing physical occupational therapy and learn to be able to walk with a walker independently.  She also has age-appropriate mild cognitive impairment I recommend she increase participation in cognitively challenging activities like solving crossword puzzles, playing bridge and sudoku.  We also discussed memory compensation strategies.  She will return follow-up in the future in 6 months or call earlier if necessary.    Memory Compensation Strategies  Use "WARM" strategy.  W= write it down  A= associate it  R= repeat it  M= make a mental note  2.   You can keep a Social worker.  Use a 3-ring notebook with sections for the following: calendar, important names and phone numbers,  medications, doctors' names/phone numbers, lists/reminders, and a section to journal what you did  each day.   3.    Use a calendar to write appointments down.  4.    Write yourself a schedule for the day.  This can be placed on the calendar or in a separate section of the Memory Notebook.  Keeping a  regular schedule can help memory.  5.    Use medication organizer with sections for each day or morning/evening pills.  You may need help loading it  6.    Keep a basket, or pegboard by the door.  Place items that you need to take out with you in the basket or on the pegboard.  You may also want to  include a message board for reminders.  7.    Use sticky notes.  Place sticky notes with reminders in a place where the task is performed.  For example: " turn off the   stove" placed by the stove, "lock the door" placed on the door at eye level, " take your medications" on  the bathroom mirror or by the place where you normally take your medications.  8.    Use alarms/timers.  Use while cooking to remind yourself to check on food or as a reminder to take your medicine, or as a  reminder to make a call, or as a reminder to perform another task, etc.

## 2023-02-04 DIAGNOSIS — I10 Essential (primary) hypertension: Secondary | ICD-10-CM | POA: Diagnosis not present

## 2023-02-12 ENCOUNTER — Encounter: Payer: Medicare PPO | Admitting: Physical Medicine & Rehabilitation

## 2023-02-12 DIAGNOSIS — N390 Urinary tract infection, site not specified: Secondary | ICD-10-CM | POA: Diagnosis not present

## 2023-02-12 DIAGNOSIS — R350 Frequency of micturition: Secondary | ICD-10-CM | POA: Diagnosis not present

## 2023-02-19 DIAGNOSIS — H34811 Central retinal vein occlusion, right eye, with macular edema: Secondary | ICD-10-CM | POA: Diagnosis not present

## 2023-02-19 DIAGNOSIS — H4051X2 Glaucoma secondary to other eye disorders, right eye, moderate stage: Secondary | ICD-10-CM | POA: Diagnosis not present

## 2023-03-04 DIAGNOSIS — S81812A Laceration without foreign body, left lower leg, initial encounter: Secondary | ICD-10-CM | POA: Diagnosis not present

## 2023-03-04 DIAGNOSIS — S81811A Laceration without foreign body, right lower leg, initial encounter: Secondary | ICD-10-CM | POA: Diagnosis not present

## 2023-03-06 DIAGNOSIS — D649 Anemia, unspecified: Secondary | ICD-10-CM | POA: Diagnosis not present

## 2023-03-06 DIAGNOSIS — S81811A Laceration without foreign body, right lower leg, initial encounter: Secondary | ICD-10-CM | POA: Diagnosis not present

## 2023-03-06 DIAGNOSIS — S81812A Laceration without foreign body, left lower leg, initial encounter: Secondary | ICD-10-CM | POA: Diagnosis not present

## 2023-03-06 DIAGNOSIS — E039 Hypothyroidism, unspecified: Secondary | ICD-10-CM | POA: Diagnosis not present

## 2023-03-13 DIAGNOSIS — D649 Anemia, unspecified: Secondary | ICD-10-CM | POA: Diagnosis not present

## 2023-03-13 DIAGNOSIS — I10 Essential (primary) hypertension: Secondary | ICD-10-CM | POA: Diagnosis not present

## 2023-03-13 DIAGNOSIS — E46 Unspecified protein-calorie malnutrition: Secondary | ICD-10-CM | POA: Diagnosis not present

## 2023-03-13 DIAGNOSIS — E039 Hypothyroidism, unspecified: Secondary | ICD-10-CM | POA: Diagnosis not present

## 2023-03-20 DIAGNOSIS — I10 Essential (primary) hypertension: Secondary | ICD-10-CM | POA: Diagnosis not present

## 2023-03-20 DIAGNOSIS — E46 Unspecified protein-calorie malnutrition: Secondary | ICD-10-CM | POA: Diagnosis not present

## 2023-03-20 DIAGNOSIS — D649 Anemia, unspecified: Secondary | ICD-10-CM | POA: Diagnosis not present

## 2023-03-20 DIAGNOSIS — E039 Hypothyroidism, unspecified: Secondary | ICD-10-CM | POA: Diagnosis not present

## 2023-03-27 DIAGNOSIS — E039 Hypothyroidism, unspecified: Secondary | ICD-10-CM | POA: Diagnosis not present

## 2023-03-27 DIAGNOSIS — S81812A Laceration without foreign body, left lower leg, initial encounter: Secondary | ICD-10-CM | POA: Diagnosis not present

## 2023-03-27 DIAGNOSIS — D649 Anemia, unspecified: Secondary | ICD-10-CM | POA: Diagnosis not present

## 2023-03-27 DIAGNOSIS — E46 Unspecified protein-calorie malnutrition: Secondary | ICD-10-CM | POA: Diagnosis not present

## 2023-04-02 ENCOUNTER — Inpatient Hospital Stay (HOSPITAL_COMMUNITY)
Admission: EM | Admit: 2023-04-02 | Discharge: 2023-04-04 | DRG: 378 | Disposition: A | Discharge status: Nursing Home (Includes ICF) | Payer: Medicare PPO | Source: Skilled Nursing Facility | Attending: Internal Medicine | Admitting: Internal Medicine

## 2023-04-02 ENCOUNTER — Encounter: Payer: Medicare PPO | Admitting: Physical Medicine & Rehabilitation

## 2023-04-02 ENCOUNTER — Encounter (HOSPITAL_COMMUNITY): Payer: Self-pay | Admitting: Emergency Medicine

## 2023-04-02 ENCOUNTER — Other Ambulatory Visit: Payer: Self-pay

## 2023-04-02 ENCOUNTER — Emergency Department (HOSPITAL_COMMUNITY): Payer: Medicare PPO

## 2023-04-02 ENCOUNTER — Encounter: Payer: Self-pay | Admitting: Physical Medicine & Rehabilitation

## 2023-04-02 VITALS — BP 116/62 | HR 56 | Ht 66.0 in | Wt 135.0 lb

## 2023-04-02 DIAGNOSIS — Z881 Allergy status to other antibiotic agents status: Secondary | ICD-10-CM | POA: Diagnosis not present

## 2023-04-02 DIAGNOSIS — G629 Polyneuropathy, unspecified: Secondary | ICD-10-CM | POA: Diagnosis present

## 2023-04-02 DIAGNOSIS — Z86718 Personal history of other venous thrombosis and embolism: Secondary | ICD-10-CM | POA: Diagnosis not present

## 2023-04-02 DIAGNOSIS — I69341 Monoplegia of lower limb following cerebral infarction affecting right dominant side: Secondary | ICD-10-CM | POA: Diagnosis not present

## 2023-04-02 DIAGNOSIS — I130 Hypertensive heart and chronic kidney disease with heart failure and stage 1 through stage 4 chronic kidney disease, or unspecified chronic kidney disease: Secondary | ICD-10-CM | POA: Diagnosis present

## 2023-04-02 DIAGNOSIS — K644 Residual hemorrhoidal skin tags: Secondary | ICD-10-CM | POA: Diagnosis not present

## 2023-04-02 DIAGNOSIS — Z90710 Acquired absence of both cervix and uterus: Secondary | ICD-10-CM

## 2023-04-02 DIAGNOSIS — Z7401 Bed confinement status: Secondary | ICD-10-CM | POA: Diagnosis not present

## 2023-04-02 DIAGNOSIS — Z7952 Long term (current) use of systemic steroids: Secondary | ICD-10-CM

## 2023-04-02 DIAGNOSIS — D120 Benign neoplasm of cecum: Secondary | ICD-10-CM | POA: Diagnosis not present

## 2023-04-02 DIAGNOSIS — K5900 Constipation, unspecified: Secondary | ICD-10-CM | POA: Diagnosis present

## 2023-04-02 DIAGNOSIS — M353 Polymyalgia rheumatica: Secondary | ICD-10-CM | POA: Diagnosis present

## 2023-04-02 DIAGNOSIS — K921 Melena: Secondary | ICD-10-CM | POA: Diagnosis not present

## 2023-04-02 DIAGNOSIS — Z96642 Presence of left artificial hip joint: Secondary | ICD-10-CM | POA: Diagnosis present

## 2023-04-02 DIAGNOSIS — K640 First degree hemorrhoids: Secondary | ICD-10-CM | POA: Diagnosis not present

## 2023-04-02 DIAGNOSIS — Z79899 Other long term (current) drug therapy: Secondary | ICD-10-CM | POA: Diagnosis not present

## 2023-04-02 DIAGNOSIS — M810 Age-related osteoporosis without current pathological fracture: Secondary | ICD-10-CM | POA: Diagnosis present

## 2023-04-02 DIAGNOSIS — K5730 Diverticulosis of large intestine without perforation or abscess without bleeding: Secondary | ICD-10-CM | POA: Diagnosis not present

## 2023-04-02 DIAGNOSIS — E039 Hypothyroidism, unspecified: Secondary | ICD-10-CM | POA: Diagnosis present

## 2023-04-02 DIAGNOSIS — I4819 Other persistent atrial fibrillation: Secondary | ICD-10-CM | POA: Diagnosis present

## 2023-04-02 DIAGNOSIS — I714 Abdominal aortic aneurysm, without rupture, unspecified: Secondary | ICD-10-CM

## 2023-04-02 DIAGNOSIS — I5032 Chronic diastolic (congestive) heart failure: Secondary | ICD-10-CM | POA: Diagnosis present

## 2023-04-02 DIAGNOSIS — I959 Hypotension, unspecified: Secondary | ICD-10-CM | POA: Diagnosis not present

## 2023-04-02 DIAGNOSIS — Z885 Allergy status to narcotic agent status: Secondary | ICD-10-CM | POA: Diagnosis not present

## 2023-04-02 DIAGNOSIS — I10 Essential (primary) hypertension: Secondary | ICD-10-CM | POA: Diagnosis present

## 2023-04-02 DIAGNOSIS — D649 Anemia, unspecified: Secondary | ICD-10-CM | POA: Diagnosis not present

## 2023-04-02 DIAGNOSIS — I4891 Unspecified atrial fibrillation: Secondary | ICD-10-CM | POA: Diagnosis not present

## 2023-04-02 DIAGNOSIS — K219 Gastro-esophageal reflux disease without esophagitis: Secondary | ICD-10-CM | POA: Diagnosis present

## 2023-04-02 DIAGNOSIS — R42 Dizziness and giddiness: Secondary | ICD-10-CM | POA: Diagnosis not present

## 2023-04-02 DIAGNOSIS — E46 Unspecified protein-calorie malnutrition: Secondary | ICD-10-CM | POA: Diagnosis not present

## 2023-04-02 DIAGNOSIS — Z825 Family history of asthma and other chronic lower respiratory diseases: Secondary | ICD-10-CM

## 2023-04-02 DIAGNOSIS — K625 Hemorrhage of anus and rectum: Principal | ICD-10-CM

## 2023-04-02 DIAGNOSIS — Z7982 Long term (current) use of aspirin: Secondary | ICD-10-CM

## 2023-04-02 DIAGNOSIS — N1832 Chronic kidney disease, stage 3b: Secondary | ICD-10-CM | POA: Diagnosis present

## 2023-04-02 DIAGNOSIS — Z85820 Personal history of malignant melanoma of skin: Secondary | ICD-10-CM

## 2023-04-02 DIAGNOSIS — K5731 Diverticulosis of large intestine without perforation or abscess with bleeding: Secondary | ICD-10-CM | POA: Diagnosis present

## 2023-04-02 DIAGNOSIS — S81812A Laceration without foreign body, left lower leg, initial encounter: Secondary | ICD-10-CM | POA: Diagnosis not present

## 2023-04-02 DIAGNOSIS — I7143 Infrarenal abdominal aortic aneurysm, without rupture: Secondary | ICD-10-CM | POA: Diagnosis present

## 2023-04-02 DIAGNOSIS — I739 Peripheral vascular disease, unspecified: Secondary | ICD-10-CM | POA: Diagnosis present

## 2023-04-02 DIAGNOSIS — R202 Paresthesia of skin: Secondary | ICD-10-CM | POA: Diagnosis present

## 2023-04-02 DIAGNOSIS — R200 Anesthesia of skin: Secondary | ICD-10-CM | POA: Diagnosis not present

## 2023-04-02 DIAGNOSIS — D123 Benign neoplasm of transverse colon: Secondary | ICD-10-CM | POA: Diagnosis not present

## 2023-04-02 DIAGNOSIS — R58 Hemorrhage, not elsewhere classified: Secondary | ICD-10-CM | POA: Diagnosis not present

## 2023-04-02 DIAGNOSIS — I610 Nontraumatic intracerebral hemorrhage in hemisphere, subcortical: Secondary | ICD-10-CM

## 2023-04-02 DIAGNOSIS — Z79890 Hormone replacement therapy (postmenopausal): Secondary | ICD-10-CM

## 2023-04-02 DIAGNOSIS — K922 Gastrointestinal hemorrhage, unspecified: Secondary | ICD-10-CM | POA: Diagnosis not present

## 2023-04-02 LAB — COMPREHENSIVE METABOLIC PANEL
ALT: 12 U/L (ref 0–44)
AST: 16 U/L (ref 15–41)
Albumin: 3.5 g/dL (ref 3.5–5.0)
Alkaline Phosphatase: 104 U/L (ref 38–126)
Anion gap: 9 (ref 5–15)
BUN: 26 mg/dL — ABNORMAL HIGH (ref 8–23)
CO2: 21 mmol/L — ABNORMAL LOW (ref 22–32)
Calcium: 8.7 mg/dL — ABNORMAL LOW (ref 8.9–10.3)
Chloride: 110 mmol/L (ref 98–111)
Creatinine, Ser: 1.5 mg/dL — ABNORMAL HIGH (ref 0.44–1.00)
GFR, Estimated: 31 mL/min — ABNORMAL LOW (ref >60)
Glucose, Bld: 100 mg/dL — ABNORMAL HIGH (ref 70–99)
Potassium: 4 mmol/L (ref 3.5–5.1)
Sodium: 140 mmol/L (ref 135–145)
Total Bilirubin: 0.7 mg/dL (ref 0.3–1.2)
Total Protein: 6.4 g/dL — ABNORMAL LOW (ref 6.5–8.1)

## 2023-04-02 LAB — CBC WITH DIFFERENTIAL/PLATELET
Abs Immature Granulocytes: 0.09 10*3/uL — ABNORMAL HIGH (ref 0.00–0.07)
Basophils Absolute: 0 10*3/uL (ref 0.0–0.1)
Basophils Relative: 0 %
Eosinophils Absolute: 0.3 10*3/uL (ref 0.0–0.5)
Eosinophils Relative: 3 %
HCT: 37 % (ref 36.0–46.0)
Hemoglobin: 11.4 g/dL — ABNORMAL LOW (ref 12.0–15.0)
Immature Granulocytes: 1 %
Lymphocytes Relative: 33 %
Lymphs Abs: 2.8 10*3/uL (ref 0.7–4.0)
MCH: 28.1 pg (ref 26.0–34.0)
MCHC: 30.8 g/dL (ref 30.0–36.0)
MCV: 91.4 fL (ref 80.0–100.0)
Monocytes Absolute: 1 10*3/uL (ref 0.1–1.0)
Monocytes Relative: 12 %
Neutro Abs: 4.3 10*3/uL (ref 1.7–7.7)
Neutrophils Relative %: 51 %
Platelets: 272 10*3/uL (ref 150–400)
RBC: 4.05 MIL/uL (ref 3.87–5.11)
RDW: 17.3 % — ABNORMAL HIGH (ref 11.5–15.5)
WBC: 8.5 10*3/uL (ref 4.0–10.5)
nRBC: 0 % (ref 0.0–0.2)

## 2023-04-02 LAB — I-STAT CHEM 8, ED
BUN: 24 mg/dL — ABNORMAL HIGH (ref 8–23)
Calcium, Ion: 1.2 mmol/L (ref 1.15–1.40)
Chloride: 110 mmol/L (ref 98–111)
Creatinine, Ser: 1.6 mg/dL — ABNORMAL HIGH (ref 0.44–1.00)
Glucose, Bld: 95 mg/dL (ref 70–99)
HCT: 36 % (ref 36.0–46.0)
Hemoglobin: 12.2 g/dL (ref 12.0–15.0)
Potassium: 4.1 mmol/L (ref 3.5–5.1)
Sodium: 141 mmol/L (ref 135–145)
TCO2: 21 mmol/L — ABNORMAL LOW (ref 22–32)

## 2023-04-02 LAB — POC OCCULT BLOOD, ED: Fecal Occult Bld: POSITIVE — AB

## 2023-04-02 MED ORDER — SODIUM CHLORIDE 0.9 % IV BOLUS
1000.0000 mL | Freq: Once | INTRAVENOUS | Status: AC
  Administered 2023-04-02: 1000 mL via INTRAVENOUS

## 2023-04-02 NOTE — ED Triage Notes (Signed)
  Patient BIB EMS for rectal bleeding that started around 1800 this evening.  Patient states she went to use the bathroom and noticed a large amount of bright red blood in the bowl.  Patient states she had no pain but felt dizzy.  Staff from SNF facility was made aware and she had another episode of the bright red blood before being transported to [facility].  Hx hemorrhoids.  No blood thinners.  No pain at this time.

## 2023-04-02 NOTE — Progress Notes (Signed)
   Subjective:    Patient ID: [name], female    DOB: 1923-05-15, 89 y.o.   MRN: [id number]  HPI    Review of Systems     Objective:   Physical Exam        Assessment & Plan:

## 2023-04-02 NOTE — ED Provider Notes (Signed)
[place] EMERGENCY DEPARTMENT AT [facility] Provider Note   CSN: [id number] Arrival date & time: 04/02/23  2030     History  Chief Complaint  Patient presents with   Rectal Bleeding    [name] is a 89 y.o. female with a pmh of intracerebral hemorrhage, persistent afib,osteoporosis, hypothyroidism .  Patient presents with 1 day of brbpr. She reports bright blood in the toilet bowl when she attempted to use the restroom. She has never had this much blood before but does endorse some hemorrhoidal bleeding in the past. Denies abdominal pain, says she has had some new constipation, Last colonoscopy 20 years ago was normal. last bowel movement 2 days ago. No currently on a blood thinner, but is on ASA and prednisone. Denies NSAID use.Has had some associated dizziness. Denies fever, chills,weight loss, night sweats, N/V, abdominal pain, diarrhea,melena, vaginal bleeding.   Rectal Bleeding      Home Medications Prior to Admission medications   Medication Sig Start Date End Date Taking? Authorizing Provider  acetaminophen (TYLENOL) 325 MG tablet Take 650 mg by mouth every 6 (six) hours as needed.    [provider]  aspirin EC 81 MG tablet Take 1 tablet (81 mg total) by mouth daily. Swallow whole. 07/18/22   Micki Riley, MD  brimonidine (ALPHAGAN) 0.2 % ophthalmic solution Place 1 drop into the right eye 2 (two) times daily.    [provider]  CRANBERRY-VITAMIN C PO Take 30 mLs by mouth daily.    [provider]  diltiazem (CARDIZEM CD) 120 MG 24 hr capsule Take 1 capsule (120 mg total) by mouth every evening. 04/30/22   Setzer, Lynnell Jude, PA-C  dorzolamide-timolol (COSOPT) 2-0.5 % ophthalmic solution 1 drop 2 (two) times daily.    [provider]  gabapentin (NEURONTIN) 100 MG capsule Take 2 capsules (200 mg total) by mouth at bedtime. 04/30/22   Setzer, Lynnell Jude, PA-C  hydrocortisone cream 1 % Apply 1 Application topically 2 (two)  times daily.    [provider]  hydroxypropyl methylcellulose / hypromellose (ISOPTO TEARS / GONIOVISC) 2.5 % ophthalmic solution 1 drop as needed for dry eyes.    [provider]  levothyroxine (SYNTHROID) 50 MCG tablet Take 1 tablet (50 mcg total) by mouth daily before breakfast. 04/30/22   Love, Evlyn Kanner, PA-C  loperamide (IMODIUM A-D) 2 MG tablet Take 2 mg by mouth 4 (four) times daily as needed for diarrhea or loose stools.    [provider]  melatonin 3 MG TABS tablet Take 3 mg by mouth at bedtime.    [provider]  Multiple Vitamins-Minerals (PRESERVISION AREDS 2 PO) Take 1 tablet by mouth daily with lunch.    [provider]  nadolol (CORGARD) 80 MG tablet Take 1 tablet (80 mg total) by mouth daily. 05/01/22   Setzer, Lynnell Jude, PA-C  nitroGLYCERIN (NITROSTAT) 0.4 MG SL tablet Place 1 tablet (0.4 mg total) under the tongue every 5 (five) minutes as needed for chest pain. 04/30/22   Setzer, Lynnell Jude, PA-C  ondansetron (ZOFRAN) 4 MG tablet Take 4 mg by mouth every 6 (six) hours as needed for nausea or vomiting.    [provider]  pantoprazole (PROTONIX) 40 MG tablet Take 1 tablet (40 mg total) by mouth daily. 04/30/22   Setzer, Lynnell Jude, PA-C  Polyethylene Glycol 3350 (MIRALAX PO) Take by mouth.    [provider]  polyvinyl alcohol (LIQUIFILM TEARS) 1.4 % ophthalmic solution Place  1 drop into the right eye as needed for dry eyes. 04/30/22   Setzer, Lynnell Jude, PA-C  predniSONE (DELTASONE) 1 MG tablet Take 2 tablets (2 mg total) by mouth daily with lunch. 04/30/22   Setzer, Lynnell Jude, PA-C  trimethoprim (TRIMPEX) 100 MG tablet Take 100 mg by mouth 2 (two) times daily.    [provider]  witch hazel-glycerin (TUCKS) pad Apply 1 Application topically as needed for itching.    [provider]      Allergies    Keflex [cephalexin] and Codeine    Review of Systems   Review of Systems  Gastrointestinal:  Positive for  hematochezia.  All other systems reviewed and are negative.   Physical Exam Updated Vital Signs BP (!) 158/97 (BP Location: Right Arm)   Pulse 63   Temp 97.9 F (36.6 C) (Oral)   Resp 16   Ht 5\' 6"  (1.676 m)   Wt 61.2 kg   SpO2 99%   BMI 21.79 kg/m  Physical Exam Constitutional:      General: She is not in acute distress.    Appearance: Normal appearance. She is not ill-appearing.  HENT:     Head: Normocephalic and atraumatic.  Eyes:     General: No scleral icterus.    Extraocular Movements: Extraocular movements intact.     Conjunctiva/sclera: Conjunctivae normal.     Pupils: Pupils are equal, round, and reactive to light.  Cardiovascular:     Rate and Rhythm: Normal rate. Rhythm irregular.     Pulses: Normal pulses.     Heart sounds: Normal heart sounds. No murmur heard.    No friction rub. No gallop.  Pulmonary:     Effort: Pulmonary effort is normal. No respiratory distress.     Breath sounds: Normal breath sounds. No wheezing or rales.  Abdominal:     General: Abdomen is flat. Bowel sounds are normal. There is no distension.     Palpations: Abdomen is soft.     Tenderness: There is no abdominal tenderness. There is no guarding.  Genitourinary:    Comments: Large non-tender external hemorrhoid burden with dried clots and bright blood in her pull-up, no bright red blood on rectal exam and no palpable masses,internal hemorrhoids or stool balls on rectal exam Musculoskeletal:     Right lower leg: No edema.     Left lower leg: No edema.  Skin:    General: Skin is warm and dry.     Comments: Poor skin turgor to the chest wall  Neurological:     Mental Status: She is alert.     ED Results / Procedures / Treatments   Labs (all labs ordered are listed, but only abnormal results are displayed) Labs Reviewed  CBC WITH DIFFERENTIAL/PLATELET  COMPREHENSIVE METABOLIC PANEL  I-STAT CHEM 8, ED  POC OCCULT BLOOD, ED    EKG None  Radiology No results  found.  Procedures Procedures    Medications Ordered in ED Medications  sodium chloride 0.9 % bolus 1,000 mL (has no administration in time range)    ED Course/ Medical Decision Making/ A&P                             Medical Decision Making Patient presents with dizziness and new BRBPR. On exam not actively bleeding but does have large external hemorrhoid burden with dried clots and bright blood in pull up. Past CT with diverticulosis. Given painless blood suspect  diverticular versus external hemorrhoidal bleed. Will check cbc although likely will not reflect tru hemoglobin until  fluid repleted.  Update: CT abdomen with contrast not performed due to renal function, CT w/o contrast with stable AAA and recommending 3 year ultrasound follow up. CBC with stable hemoglobin. Remains HDS. Signed out with hospitalist service for observation admission. GI contacted.  Amount and/or Complexity of Data Reviewed Labs: ordered. Radiology: ordered.    Final Clinical Impression(s) / ED Diagnoses Final diagnoses:  None    Rx / DC Orders ED Discharge Orders     None         Willette Cluster, MD 04/02/23 2321    Charlynne Pander, MD 04/04/23 1520

## 2023-04-02 NOTE — Progress Notes (Signed)
Subjective:    Patient ID: [name], female    DOB: [birth year], 89 y.o.   MRN: [id number]  HPI  [name] is here in follow up of her left thalamic hemorrhage. I last saw her in February.. she feels that she's gone backward over the last few months. She is walking but only 4 days a week with one of the therapists. She feels that they don't know what to do with her anymore. She does have a foot bike. She isn't doing any other resistance exercises. The facility does have a work out area apparently. She still compains of numbness in left and right legs. It really hasn't changed. She tried the foot up afo but it didn't work for her. She still tends to drag the right leg. Pain is controled  She denies any new health issues. There is occasional dizziness when she looks down or lays down but she denies any vertigo like symptoms. The room isn't spinning.   Pain Inventory Average Pain 0 Pain Right Now 0 My pain is  none  LOCATION OF PAIN  n/a  BOWEL Number of stools per week: 1 Oral laxative use Yes  Type of laxative Miralax Enema or suppository use No  History of colostomy No  Incontinent No   BLADDER Normal In and out cath, frequency n/a Able to self cath No  Bladder incontinence No  Frequent urination No  Leakage with coughing No  Difficulty starting stream No  Incomplete bladder emptying No    Mobility use a wheelchair Do you have any goals in this area?  yes  Function retired  Neuro/Psych numbness trouble walking dizziness  Prior Studies N/a  Physicians involved in your care Any changes since last visit?  no   Family History  Problem Relation Age of Onset   Cancer Mother        BREAST   COPD Father    Emphysema Father    Cancer Daughter        breast   Social History   Socioeconomic History   Marital status: Widowed    Spouse name: Not on file   Number of children: Not on file   Years of education: Not on file   Highest education level: Not  on file  Occupational History   Occupation: retired  Tobacco Use   Smoking status: Never   Smokeless tobacco: Never  Vaping Use   Vaping Use: Never used  Substance and Sexual Activity   Alcohol use: No   Drug use: No   Sexual activity: Never  Other Topics Concern   Not on file  Social History Narrative   Lots of family nearby   She has an aide 4 days per week   Social Determinants of Health   Financial Resource Strain: Low Risk  (12/27/2021)   Overall Financial Resource Strain (CARDIA)    Difficulty of Paying Living Expenses: Not hard at all  Food Insecurity: No Food Insecurity (12/27/2021)   Hunger Vital Sign    Worried About Running Out of Food in the Last Year: Never true    Ran Out of Food in the Last Year: Never true  Transportation Needs: No Transportation Needs (12/27/2021)   PRAPARE - Administrator, Civil Service (Medical): No    Lack of Transportation (Non-Medical): No  Physical Activity: Insufficiently Active (12/27/2021)   Exercise Vital Sign    Days of Exercise per Week: 7 days    Minutes of Exercise per Session:  20 min  Stress: No Stress Concern Present (12/27/2021)   [employer] of Occupational Health - Occupational Stress Questionnaire    Feeling of Stress : Not at all  Social Connections: Moderately Integrated (12/27/2021)   Social Connection and Isolation Panel [NHANES]    Frequency of Communication with Friends and Family: More than three times a week    Frequency of Social Gatherings with Friends and Family: Once a week    Attends Religious Services: 1 to 4 times per year    Active Member of [employer] or Organizations: Yes    Attends Banker Meetings: 1 to 4 times per year    Marital Status: Widowed   Past Surgical History:  Procedure Laterality Date   ABDOMINAL HYSTERECTOMY     APPENDECTOMY     BACK SURGERY     BREAST SURGERY     left breast biopsy   CARDIOVERSION N/A 05/09/2020   Procedure: CARDIOVERSION;  Surgeon:  Chilton Si, MD;  Location: [facility] END[facility];  Service: Cardiovascular;  Laterality: N/A;   COLOSTOMY N/A 01/06/2020   Procedure: Colostomy;  Surgeon: Gaynelle Adu, MD;  Location: [facility] OR;  Service: General;  Laterality: N/A;   EYE SURGERY     cataract   HARDWARE REMOVAL Left 01/01/2020   Procedure: HARDWARE REMOVAL;  Surgeon: Bradly Bienenstock, MD;  Location: [facility] OR;  Service: Orthopedics;  Laterality: Left;   HIP ARTHROPLASTY Left 12/03/2020   Procedure: ARTHROPLASTY BIPOLAR HIP (HEMIARTHROPLASTY);  Surgeon: Sheral Apley, MD;  Location: [facility] OR;  Service: Orthopedics;  Laterality: Left;   HIP ARTHROPLASTY Left    partial   IR KYPHO EA ADDL LEVEL THORACIC OR LUMBAR  06/27/2021   IR RADIOLOGIST EVAL & MGMT  07/13/2021   LAPAROTOMY N/A 01/06/2020   Procedure: Exploratory Laparotomy, Open Sigmoid colectomy;  Surgeon: Gaynelle Adu, MD;  Location: [facility] OR;  Service: General;  Laterality: N/A;   LIPOMA EXCISION Left 04/25/2015   Procedure: EXCISION OF LIPOMA LEFT ARM;  Surgeon: Manus Rudd, MD;  Location: [facility];  Service: General;  Laterality: Left;   LUMBAR LAMINECTOMY/DECOMPRESSION MICRODISCECTOMY  11/20/2011   Procedure: LUMBAR LAMINECTOMY/DECOMPRESSION MICRODISCECTOMY;  Surgeon: Illene Labrador [place];  Location: [facility] ORS;  Service: Orthopedics;  Laterality: N/A;  Decompressive Laminectomy L2 to the Sacrum (X-Ray)   LYSIS OF ADHESION N/A 01/17/2021   Procedure: RIGID PROCTOSCOPY;  Surgeon: Karie Soda, MD;  Location: [facility] ORS;  Service: General;  Laterality: N/A;   OPEN REDUCTION INTERNAL FIXATION (ORIF) DISTAL RADIAL FRACTURE Left 01/01/2020   Procedure: OPEN REDUCTION INTERNAL FIXATION (ORIF) DISTAL RADIAL FRACTURE;  Surgeon: Bradly Bienenstock, MD;  Location: [facility] OR;  Service: Orthopedics;  Laterality: Left;   OTHER SURGICAL HISTORY     left wrist surgery - has plate in left wrist    OTHER SURGICAL HISTORY     right knee surgery due to torn cartilage    TONSILLECTOMY     WRIST FRACTURE SURGERY  Left    XI ROBOTIC ASSISTED COLOSTOMY TAKEDOWN N/A 01/17/2021   Procedure: XI ROBOTIC ASSISTED OSTOMY TAKEDOWN, LYSIS OF ADHESIONS, RECTOSIGMOID RESECTION, BILATERAL TAP BLOCK;  Surgeon: Karie Soda, MD;  Location: [facility] ORS;  Service: General;  Laterality: N/A;   Past Medical History:  Diagnosis Date   Closed fracture of left distal radius 12/29/2019   DVT (deep venous thrombosis) (HCC) 09/2015   RLE   Dysrhythmia    Afib   GERD (gastroesophageal reflux disease)    Hip fracture (HCC) 12/02/2020   Hypertension    Hypothyroidism  Melanoma (HCC) 06/16/2017   Facial melanoma, removed by Dr. Alean Rinne   Peripheral vascular disease [facility])    peripheral neuropathy   BP 116/62   Pulse (!) 56   Ht 5\' 6"  (1.676 m)   Wt 135 lb (61.2 kg)   SpO2 97%   BMI 21.79 kg/m   Opioid Risk Score:   Fall Risk Score:  `1  Depression screen [facility] 2/9     04/02/2023    2:07 PM 08/14/2022    3:27 PM 05/17/2022   11:30 AM 03/12/2022    2:02 PM 12/27/2021   12:54 PM 10/24/2021    1:37 PM 04/19/2021   10:31 AM  Depression screen [facility] 2/9  Decreased Interest 0 0 1 0 1 1 0  Down, Depressed, Hopeless 0 0 1 0 1 1 0  PHQ - 2 Score 0 0 2 0 2 2 0  Altered sleeping   0 0 0 0 0  Tired, decreased energy   1 0 1 1 0  Change in appetite   0 0 0 0 0  Feeling bad or failure about yourself    2 0 0 0 0  Trouble concentrating   1 0 0 1 0  Moving slowly or fidgety/restless   0 0 0 0 0  Suicidal thoughts    0 0 0 0  PHQ-9 Score   6 0 3 4 0  Difficult doing work/chores    Not difficult at all Somewhat difficult Somewhat difficult Not difficult at all     Review of Systems     Objective:   Physical Exam General: No acute distress HEENT: NCAT, EOMI, oral membranes moist Cards: reg rate  Chest: normal effort Abdomen: Soft, NT, ND Skin: dry, intact Extremities: no edema Psych: pleasant and appropriate  Skin: Clean and intact without signs of breakdown Neuro:  Alert and oriented x 3. Normal insight and awareness.  Intact Memory. Normal language and speech. Cranial nerve exam unremarkable. RUE 4+ 5/5, RLE 4+/5 prox to distal. LUE and LLE 4+ to 5/5. Decreased LT/pain in RLE tr/2 and in LLE below knee 1 to 1+2 --no changes  DTR's 1+.  Musculoskeletal: no jt pain today           Assessment & Plan:  Medical Problem List and Plan: 1. Functional deficits secondary to left thalamic intracranial hemorrhage             - foot up AFO didn't work for her  -needs more daily activity from an             -Continue with therapy at facility.  -Needs to increase strength/stamina 2.  Leg numbness and pain: -chronic d/t lumbar spondylosis and radiculopathy -celebrex prn ok -unfortunately sensory symptoms will be long term in legs.  3.  Hypertension: per primary              -May want to reconsider Lasix given her bladder issues 4: pAF: Per primary and cardiology   5: bilateral leg pain: hold statin for now             -check electrolytes 6: Urinary retention: continue Flomax             -on prophylactic abx             -fluid ratioing to morning as possible   7:  Chronic kidney disease    18: Polymyalgia rheumatica: prednisone 2 mg with lunch 8. Dysphagia:               -  Resolved  . 9. macular degeneration of the right eye,   .   20 minutes of face to face patient care time were spent during this visit. All questions were encouraged and answered. Follow up with me PRN

## 2023-04-02 NOTE — Patient Instructions (Addendum)
ALWAYS FEEL FREE TO CALL OUR OFFICE WITH ANY PROBLEMS OR QUESTIONS [phone])  **PLEASE NOTE** ALL MEDICATION REFILL REQUESTS (INCLUDING CONTROLLED SUBSTANCES) NEED TO BE MADE AT LEAST 7 DAYS PRIOR TO REFILL BEING DUE. ANY REFILL REQUESTS INSIDE THAT TIME FRAME MAY RESULT IN DELAYS IN RECEIVING YOUR PRESCRIPTION.    WORK OUT PROGRAM!  AMBULATION TWICE DAILY WITH SUPERVISION  RESISTANCE EXERCISES:  1.THERABANDS FOR STRENGTHENING  2.FOOTBIKE DAILY  3.VELCRO WEIGHTS.   4 STATIONARY BIKE WOULD BE GREAT TOO!

## 2023-04-03 DIAGNOSIS — M353 Polymyalgia rheumatica: Secondary | ICD-10-CM | POA: Diagnosis present

## 2023-04-03 DIAGNOSIS — E039 Hypothyroidism, unspecified: Secondary | ICD-10-CM | POA: Diagnosis present

## 2023-04-03 DIAGNOSIS — Z881 Allergy status to other antibiotic agents status: Secondary | ICD-10-CM | POA: Diagnosis not present

## 2023-04-03 DIAGNOSIS — Z96642 Presence of left artificial hip joint: Secondary | ICD-10-CM | POA: Diagnosis present

## 2023-04-03 DIAGNOSIS — K219 Gastro-esophageal reflux disease without esophagitis: Secondary | ICD-10-CM | POA: Diagnosis present

## 2023-04-03 DIAGNOSIS — I714 Abdominal aortic aneurysm, without rupture, unspecified: Secondary | ICD-10-CM

## 2023-04-03 DIAGNOSIS — I4819 Other persistent atrial fibrillation: Secondary | ICD-10-CM | POA: Diagnosis present

## 2023-04-03 DIAGNOSIS — I7143 Infrarenal abdominal aortic aneurysm, without rupture: Secondary | ICD-10-CM | POA: Diagnosis present

## 2023-04-03 DIAGNOSIS — Z7982 Long term (current) use of aspirin: Secondary | ICD-10-CM | POA: Diagnosis not present

## 2023-04-03 DIAGNOSIS — K921 Melena: Secondary | ICD-10-CM

## 2023-04-03 DIAGNOSIS — K922 Gastrointestinal hemorrhage, unspecified: Secondary | ICD-10-CM

## 2023-04-03 DIAGNOSIS — Z86718 Personal history of other venous thrombosis and embolism: Secondary | ICD-10-CM | POA: Diagnosis not present

## 2023-04-03 DIAGNOSIS — Z79899 Other long term (current) drug therapy: Secondary | ICD-10-CM | POA: Diagnosis not present

## 2023-04-03 DIAGNOSIS — I69341 Monoplegia of lower limb following cerebral infarction affecting right dominant side: Secondary | ICD-10-CM | POA: Diagnosis not present

## 2023-04-03 DIAGNOSIS — K5731 Diverticulosis of large intestine without perforation or abscess with bleeding: Secondary | ICD-10-CM | POA: Diagnosis present

## 2023-04-03 DIAGNOSIS — K5900 Constipation, unspecified: Secondary | ICD-10-CM | POA: Diagnosis present

## 2023-04-03 DIAGNOSIS — G629 Polyneuropathy, unspecified: Secondary | ICD-10-CM | POA: Diagnosis present

## 2023-04-03 DIAGNOSIS — Z7952 Long term (current) use of systemic steroids: Secondary | ICD-10-CM | POA: Diagnosis not present

## 2023-04-03 DIAGNOSIS — K625 Hemorrhage of anus and rectum: Secondary | ICD-10-CM | POA: Diagnosis present

## 2023-04-03 DIAGNOSIS — Z90710 Acquired absence of both cervix and uterus: Secondary | ICD-10-CM | POA: Diagnosis not present

## 2023-04-03 DIAGNOSIS — I130 Hypertensive heart and chronic kidney disease with heart failure and stage 1 through stage 4 chronic kidney disease, or unspecified chronic kidney disease: Secondary | ICD-10-CM | POA: Diagnosis present

## 2023-04-03 DIAGNOSIS — I739 Peripheral vascular disease, unspecified: Secondary | ICD-10-CM | POA: Diagnosis present

## 2023-04-03 DIAGNOSIS — I5032 Chronic diastolic (congestive) heart failure: Secondary | ICD-10-CM | POA: Diagnosis present

## 2023-04-03 DIAGNOSIS — N1832 Chronic kidney disease, stage 3b: Secondary | ICD-10-CM | POA: Diagnosis present

## 2023-04-03 DIAGNOSIS — Z885 Allergy status to narcotic agent status: Secondary | ICD-10-CM | POA: Diagnosis not present

## 2023-04-03 DIAGNOSIS — Z85820 Personal history of malignant melanoma of skin: Secondary | ICD-10-CM | POA: Diagnosis not present

## 2023-04-03 DIAGNOSIS — Z825 Family history of asthma and other chronic lower respiratory diseases: Secondary | ICD-10-CM | POA: Diagnosis not present

## 2023-04-03 DIAGNOSIS — Z79890 Hormone replacement therapy (postmenopausal): Secondary | ICD-10-CM | POA: Diagnosis not present

## 2023-04-03 LAB — TYPE AND SCREEN
ABO/RH(D): O POS
Antibody Screen: NEGATIVE

## 2023-04-03 LAB — CBC
HCT: 35.3 % — ABNORMAL LOW (ref 36.0–46.0)
Hemoglobin: 11.1 g/dL — ABNORMAL LOW (ref 12.0–15.0)
MCH: 28.5 pg (ref 26.0–34.0)
MCHC: 31.4 g/dL (ref 30.0–36.0)
MCV: 90.5 fL (ref 80.0–100.0)
Platelets: 257 10*3/uL (ref 150–400)
RBC: 3.9 MIL/uL (ref 3.87–5.11)
RDW: 17.3 % — ABNORMAL HIGH (ref 11.5–15.5)
WBC: 9.4 10*3/uL (ref 4.0–10.5)
nRBC: 0 % (ref 0.0–0.2)

## 2023-04-03 LAB — BRAIN NATRIURETIC PEPTIDE: B Natriuretic Peptide: 386.1 pg/mL — ABNORMAL HIGH (ref 0.0–100.0)

## 2023-04-03 LAB — BASIC METABOLIC PANEL
Anion gap: 7 (ref 5–15)
BUN: 19 mg/dL (ref 8–23)
CO2: 20 mmol/L — ABNORMAL LOW (ref 22–32)
Calcium: 8.5 mg/dL — ABNORMAL LOW (ref 8.9–10.3)
Chloride: 115 mmol/L — ABNORMAL HIGH (ref 98–111)
Creatinine, Ser: 1.37 mg/dL — ABNORMAL HIGH (ref 0.44–1.00)
GFR, Estimated: 35 mL/min — ABNORMAL LOW (ref >60)
Glucose, Bld: 95 mg/dL (ref 70–99)
Potassium: 4 mmol/L (ref 3.5–5.1)
Sodium: 142 mmol/L (ref 135–145)

## 2023-04-03 MED ORDER — ACETAMINOPHEN 650 MG RE SUPP: 650.0000 mg | Freq: Four times a day (QID) | RECTAL | Status: DC | PRN

## 2023-04-03 MED ORDER — PANTOPRAZOLE SODIUM 40 MG PO TBEC
40.0000 mg | DELAYED_RELEASE_TABLET | Freq: Every day | ORAL | Status: DC
  Administered 2023-04-03 – 2023-04-04 (×2): 40 mg via ORAL
  Filled 2023-04-03 (×2): qty 1

## 2023-04-03 MED ORDER — NADOLOL 20 MG PO TABS
80.0000 mg | ORAL_TABLET | Freq: Every day | ORAL | Status: DC
  Administered 2023-04-03 – 2023-04-04 (×2): 80 mg via ORAL
  Filled 2023-04-03 (×2): qty 4

## 2023-04-03 MED ORDER — GABAPENTIN 100 MG PO CAPS
200.0000 mg | ORAL_CAPSULE | Freq: Every day | ORAL | Status: DC
  Administered 2023-04-03: 200 mg via ORAL
  Filled 2023-04-03: qty 2

## 2023-04-03 MED ORDER — MELATONIN 3 MG PO TABS
6.0000 mg | ORAL_TABLET | Freq: Every day | ORAL | Status: DC
  Administered 2023-04-03: 6 mg via ORAL
  Filled 2023-04-03: qty 2

## 2023-04-03 MED ORDER — WITCH HAZEL-GLYCERIN EX PADS: 1.0000 | MEDICATED_PAD | CUTANEOUS | Status: DC | PRN

## 2023-04-03 MED ORDER — SODIUM CHLORIDE 0.9 % IV SOLN: INTRAVENOUS | Status: AC

## 2023-04-03 MED ORDER — BRIMONIDINE TARTRATE 0.2 % OP SOLN
1.0000 [drp] | Freq: Two times a day (BID) | OPHTHALMIC | Status: DC
  Administered 2023-04-03 – 2023-04-04 (×2): 1 [drp] via OPHTHALMIC
  Filled 2023-04-03: qty 5

## 2023-04-03 MED ORDER — LOSARTAN POTASSIUM 50 MG PO TABS
50.0000 mg | ORAL_TABLET | Freq: Every day | ORAL | Status: DC
  Administered 2023-04-03 – 2023-04-04 (×2): 50 mg via ORAL
  Filled 2023-04-03 (×2): qty 1

## 2023-04-03 MED ORDER — HYDROCORTISONE ACETATE 25 MG RE SUPP
25.0000 mg | Freq: Every day | RECTAL | Status: DC
  Filled 2023-04-03: qty 1

## 2023-04-03 MED ORDER — LEVOTHYROXINE SODIUM 50 MCG PO TABS
50.0000 ug | ORAL_TABLET | Freq: Every day | ORAL | Status: DC
  Administered 2023-04-04: 50 ug via ORAL
  Filled 2023-04-03: qty 1

## 2023-04-03 MED ORDER — ACETAMINOPHEN 325 MG PO TABS
650.0000 mg | ORAL_TABLET | Freq: Four times a day (QID) | ORAL | Status: DC | PRN
  Administered 2023-04-03: 650 mg via ORAL
  Filled 2023-04-03: qty 2

## 2023-04-03 MED ORDER — PROSIGHT PO TABS: 1.0000 | ORAL_TABLET | Freq: Every day | ORAL | Status: DC

## 2023-04-03 MED ORDER — TRIMETHOPRIM 100 MG PO TABS
100.0000 mg | ORAL_TABLET | Freq: Every day | ORAL | Status: DC
  Administered 2023-04-04: 100 mg via ORAL
  Filled 2023-04-03: qty 1

## 2023-04-03 MED ORDER — POLYVINYL ALCOHOL 1.4 % OP SOLN
1.0000 [drp] | Freq: Four times a day (QID) | OPHTHALMIC | Status: DC
  Administered 2023-04-03 – 2023-04-04 (×3): 1 [drp] via OPHTHALMIC
  Filled 2023-04-03: qty 15

## 2023-04-03 MED ORDER — METOPROLOL TARTRATE 5 MG/5ML IV SOLN
2.5000 mg | Freq: Once | INTRAVENOUS | Status: AC
  Administered 2023-04-03: 2.5 mg via INTRAVENOUS
  Filled 2023-04-03: qty 5

## 2023-04-03 MED ORDER — DILTIAZEM HCL ER COATED BEADS 120 MG PO CP24
120.0000 mg | ORAL_CAPSULE | Freq: Every evening | ORAL | Status: DC
  Administered 2023-04-03: 120 mg via ORAL
  Filled 2023-04-03: qty 1

## 2023-04-03 MED ORDER — DORZOLAMIDE HCL-TIMOLOL MAL 2-0.5 % OP SOLN
1.0000 [drp] | Freq: Two times a day (BID) | OPHTHALMIC | Status: DC
  Administered 2023-04-03 – 2023-04-04 (×2): 1 [drp] via OPHTHALMIC
  Filled 2023-04-03: qty 10

## 2023-04-03 MED ORDER — METOPROLOL TARTRATE 5 MG/5ML IV SOLN
2.5000 mg | Freq: Three times a day (TID) | INTRAVENOUS | Status: DC | PRN
  Administered 2023-04-03: 2.5 mg via INTRAVENOUS
  Filled 2023-04-03: qty 5

## 2023-04-03 MED ORDER — ONDANSETRON HCL 4 MG PO TABS: 4.0000 mg | ORAL_TABLET | Freq: Four times a day (QID) | ORAL | Status: DC | PRN

## 2023-04-03 NOTE — ED Notes (Signed)
Bed alarm activated for patient safety.  

## 2023-04-03 NOTE — H&P (Incomplete)
History and Physical    [name] ZOX:[id number] DOB: [birth year] DOA: 04/02/2023  PCP: Eloisa Northern, MD  Patient coming from: {Blank single:19197::"Home","SNF","ALF/ILF","Group Home","BHH","CIR","Hospice","Homeless","[facility] ED","[facility] ED","Outside Hospital","***"}  Chief Complaint: ***  HPI: [name] is a 89 y.o. female with medical history significant of ***   Patient states she has been constipated and took MiraLAX at home which resulted in her having diarrhea for the past 2 days.  This evening around 6 PM she had 1 episode of large-volume bright red blood per rectum.  No recurrence of rectal bleeding since then.  Denies abdominal pain.  She has felt dizzy but denies any chest pain or shortness of breath at this time.  She takes a baby aspirin daily and is no longer on anticoagulation for her A-fib due to history of prior brain bleed.  She reports history of colonoscopy over 10 years ago.  No other complaints.  Review of Systems:  Review of Systems  All other systems reviewed and are negative.   Past Medical History:  Diagnosis Date  . Closed fracture of left distal radius 12/29/2019  . DVT (deep venous thrombosis) (HCC) 09/2015   RLE  . Dysrhythmia    Afib  . GERD (gastroesophageal reflux disease)   . Hip fracture (HCC) 12/02/2020  . Hypertension   . Hypothyroidism   . Melanoma (HCC) 06/16/2017   Facial melanoma, removed by Dr. Alean Rinne  . Peripheral vascular disease (HCC)    peripheral neuropathy    Past Surgical History:  Procedure Laterality Date  . ABDOMINAL HYSTERECTOMY    . APPENDECTOMY    . BACK SURGERY    . BREAST SURGERY     left breast biopsy  . CARDIOVERSION N/A 05/09/2020   Procedure: CARDIOVERSION;  Surgeon: Chilton Si, MD;  Location: [facility] ENDOSCOPY;  Service: Cardiovascular;  Laterality: N/A;  . COLOSTOMY N/A 01/06/2020   Procedure: Colostomy;  Surgeon: Gaynelle Adu, MD;  Location: [facility] OR;  Service: General;  Laterality: N/A;  . EYE SURGERY      cataract  . HARDWARE REMOVAL Left 01/01/2020   Procedure: HARDWARE REMOVAL;  Surgeon: Bradly Bienenstock, MD;  Location: [facility] OR;  Service: Orthopedics;  Laterality: Left;  . HIP ARTHROPLASTY Left 12/03/2020   Procedure: ARTHROPLASTY BIPOLAR HIP (HEMIARTHROPLASTY);  Surgeon: Sheral Apley, MD;  Location: [facility] OR;  Service: Orthopedics;  Laterality: Left;  . HIP ARTHROPLASTY Left    partial  . IR KYPHO EA ADDL LEVEL THORACIC OR LUMBAR  06/27/2021  . IR RADIOLOGIST EVAL & MGMT  07/13/2021  . LAPAROTOMY N/A 01/06/2020   Procedure: Exploratory Laparotomy, Open Sigmoid colectomy;  Surgeon: Gaynelle Adu, MD;  Location: [facility] OR;  Service: General;  Laterality: N/A;  . LIPOMA EXCISION Left 04/25/2015   Procedure: EXCISION OF LIPOMA LEFT ARM;  Surgeon: Manus Rudd, MD;  Location: [facility];  Service: General;  Laterality: Left;  . LUMBAR LAMINECTOMY/DECOMPRESSION MICRODISCECTOMY  11/20/2011   Procedure: LUMBAR LAMINECTOMY/DECOMPRESSION MICRODISCECTOMY;  Surgeon: Fayrene Fearing P [place];  Location: [facility] ORS;  Service: Orthopedics;  Laterality: N/A;  Decompressive Laminectomy L2 to the Sacrum (X-Ray)  . LYSIS OF ADHESION N/A 01/17/2021   Procedure: RIGID PROCTOSCOPY;  Surgeon: Karie Soda, MD;  Location: [facility] ORS;  Service: General;  Laterality: N/A;  . OPEN REDUCTION INTERNAL FIXATION (ORIF) DISTAL RADIAL FRACTURE Left 01/01/2020   Procedure: OPEN REDUCTION INTERNAL FIXATION (ORIF) DISTAL RADIAL FRACTURE;  Surgeon: Bradly Bienenstock, MD;  Location: [facility] OR;  Service: Orthopedics;  Laterality: Left;  . OTHER SURGICAL HISTORY  left wrist surgery - has plate in left wrist   . OTHER SURGICAL HISTORY     right knee surgery due to torn cartilage   . TONSILLECTOMY    . WRIST FRACTURE SURGERY Left   . XI ROBOTIC ASSISTED COLOSTOMY TAKEDOWN N/A 01/17/2021   Procedure: XI ROBOTIC ASSISTED OSTOMY TAKEDOWN, LYSIS OF ADHESIONS, RECTOSIGMOID RESECTION, BILATERAL TAP BLOCK;  Surgeon: Karie Soda, MD;  Location: [facility] ORS;   Service: General;  Laterality: N/A;     reports that she has never smoked. She has never used smokeless tobacco. She reports that she does not drink alcohol and does not use drugs.  Allergies  Allergen Reactions  . Keflex [Cephalexin] Nausea Only and Other (See Comments)    Pt ended up in ER w/ CHEST PAIN.  Patient experienced chest pain again with re-challenge of cephalexin in May 2023.  Marland Kitchen Codeine Other (See Comments)    "Nightmares, imagined things"    Family History  Problem Relation Age of Onset  . Cancer Mother        BREAST  . COPD Father   . Emphysema Father   . Cancer Daughter        breast    Prior to Admission medications   Medication Sig Start Date End Date Taking? Authorizing Provider  acetaminophen (TYLENOL) 325 MG tablet Take 650 mg by mouth every 6 (six) hours as needed.    [provider]  aspirin EC 81 MG tablet Take 1 tablet (81 mg total) by mouth daily. Swallow whole. 07/18/22   Micki Riley, MD  brimonidine (ALPHAGAN) 0.2 % ophthalmic solution Place 1 drop into the right eye 2 (two) times daily.    [provider]  CRANBERRY-VITAMIN C PO Take 30 mLs by mouth daily.    [provider]  diltiazem (CARDIZEM CD) 120 MG 24 hr capsule Take 1 capsule (120 mg total) by mouth every evening. 04/30/22   Setzer, Lynnell Jude, PA-C  dorzolamide-timolol (COSOPT) 2-0.5 % ophthalmic solution 1 drop 2 (two) times daily.    [provider]  gabapentin (NEURONTIN) 100 MG capsule Take 2 capsules (200 mg total) by mouth at bedtime. 04/30/22   Setzer, Lynnell Jude, PA-C  hydrocortisone cream 1 % Apply 1 Application topically 2 (two) times daily.    [provider]  hydroxypropyl methylcellulose / hypromellose (ISOPTO TEARS / GONIOVISC) 2.5 % ophthalmic solution 1 drop as needed for dry eyes.    [provider]  levothyroxine (SYNTHROID) 50 MCG tablet Take 1 tablet (50 mcg total) by mouth daily before breakfast. 04/30/22   Love, Evlyn Kanner,  PA-C  loperamide (IMODIUM A-D) 2 MG tablet Take 2 mg by mouth 4 (four) times daily as needed for diarrhea or loose stools.    [provider]  melatonin 3 MG TABS tablet Take 3 mg by mouth at bedtime.    [provider]  Multiple Vitamins-Minerals (PRESERVISION AREDS 2 PO) Take 1 tablet by mouth daily with lunch.    [provider]  nadolol (CORGARD) 80 MG tablet Take 1 tablet (80 mg total) by mouth daily. 05/01/22   Setzer, Lynnell Jude, PA-C  nitroGLYCERIN (NITROSTAT) 0.4 MG SL tablet Place 1 tablet (0.4 mg total) under the tongue every 5 (five) minutes as needed for chest pain. 04/30/22   Setzer, Lynnell Jude, PA-C  ondansetron (ZOFRAN) 4 MG tablet Take 4 mg by mouth every 6 (six) hours as needed for nausea or vomiting.    [provider]  pantoprazole (  PROTONIX) 40 MG tablet Take 1 tablet (40 mg total) by mouth daily. 04/30/22   Setzer, Lynnell Jude, PA-C  Polyethylene Glycol 3350 (MIRALAX PO) Take by mouth.    [provider]  polyvinyl alcohol (LIQUIFILM TEARS) 1.4 % ophthalmic solution Place 1 drop into the right eye as needed for dry eyes. 04/30/22   Setzer, Lynnell Jude, PA-C  predniSONE (DELTASONE) 1 MG tablet Take 2 tablets (2 mg total) by mouth daily with lunch. 04/30/22   Setzer, Lynnell Jude, PA-C  trimethoprim (TRIMPEX) 100 MG tablet Take 100 mg by mouth 2 (two) times daily.    [provider]  witch hazel-glycerin (TUCKS) pad Apply 1 Application topically as needed for itching.    [provider]    Physical Exam: Vitals:   04/02/23 2040 04/02/23 2041 04/02/23 2100 04/02/23 2130  BP: (!) 158/97  (!) 149/81 (!) 148/67  Pulse: 63  68 63  Resp: 16  17 (!) 21  Temp: 97.9 F (36.6 C)     TempSrc: Oral     SpO2: 99%  97% 98%  Weight:  61.2 kg    Height:  5\' 6"  (1.676 m)      Physical Exam Vitals reviewed.  Constitutional:      General: She is not in acute distress. HENT:     Head: Normocephalic and atraumatic.  Eyes:     Extraocular  Movements: Extraocular movements intact.  Cardiovascular:     Rate and Rhythm: Normal rate and regular rhythm.     Pulses: Normal pulses.  Pulmonary:     Effort: Pulmonary effort is normal. No respiratory distress.     Breath sounds: Normal breath sounds. No wheezing or rales.  Abdominal:     General: Bowel sounds are normal. There is no distension.     Palpations: Abdomen is soft.     Tenderness: There is no abdominal tenderness.  Musculoskeletal:     Cervical back: Normal range of motion.     Right lower leg: No edema.     Left lower leg: No edema.  Skin:    General: Skin is warm and dry.  Neurological:     General: No focal deficit present.     Mental Status: She is alert and oriented to person, place, and time.     Labs on Admission: I have personally reviewed following labs and imaging studies  CBC: Recent Labs  Lab 04/02/23 2050 04/02/23 2120  WBC 8.5  --   NEUTROABS 4.3  --   HGB 11.4* 12.2  HCT 37.0 36.0  MCV 91.4  --   PLT 272  --    Basic Metabolic Panel: Recent Labs  Lab 04/02/23 2050 04/02/23 2120  NA 140 141  K 4.0 4.1  CL 110 110  CO2 21*  --   GLUCOSE 100* 95  BUN 26* 24*  CREATININE 1.50* 1.60*  CALCIUM 8.7*  --    GFR: Estimated Creatinine Clearance: 18.4 mL/min (A) (by C-G formula based on SCr of 1.6 mg/dL (H)). Liver Function Tests: Recent Labs  Lab 04/02/23 2050  AST 16  ALT 12  ALKPHOS 104  BILITOT 0.7  PROT 6.4*  ALBUMIN 3.5   No results for input(s): "LIPASE", "AMYLASE" in the last 168 hours. No results for input(s): "AMMONIA" in the last 168 hours. Coagulation Profile: No results for input(s): "INR", "PROTIME" in the last 168 hours. Cardiac Enzymes: No results for input(s): "CKTOTAL", "CKMB", "CKMBINDEX", "TROPONINI" in the last 168 hours. BNP (last 3  results) No results for input(s): "PROBNP" in the last 8760 hours. HbA1C: No results for input(s): "HGBA1C" in the last 72 hours. CBG: No results for input(s): "GLUCAP"  in the last 168 hours. Lipid Profile: No results for input(s): "CHOL", "HDL", "LDLCALC", "TRIG", "CHOLHDL", "LDLDIRECT" in the last 72 hours. Thyroid Function Tests: No results for input(s): "TSH", "T4TOTAL", "FREET4", "T3FREE", "THYROIDAB" in the last 72 hours. Anemia Panel: No results for input(s): "VITAMINB12", "FOLATE", "FERRITIN", "TIBC", "IRON", "RETICCTPCT" in the last 72 hours. Urine analysis:    Component Value Date/Time   COLORURINE YELLOW 04/04/2022 1143   APPEARANCEUR CLEAR 04/04/2022 1143   LABSPEC 1.011 04/04/2022 1143   PHURINE 5.0 04/04/2022 1143   GLUCOSEU NEGATIVE 04/04/2022 1143   GLUCOSEU NEGATIVE 02/22/2016 0926   HGBUR NEGATIVE 04/04/2022 1143   BILIRUBINUR NEGATIVE 04/04/2022 1143   BILIRUBINUR positive 12/10/2021 1134   KETONESUR NEGATIVE 04/04/2022 1143   PROTEINUR NEGATIVE 04/04/2022 1143   UROBILINOGEN 4.0 (A) 12/10/2021 1134   UROBILINOGEN 0.2 02/22/2016 0926   NITRITE NEGATIVE 04/04/2022 1143   LEUKOCYTESUR NEGATIVE 04/04/2022 1143    Radiological Exams on Admission: CT ABDOMEN PELVIS WO CONTRAST  Result Date: 04/02/2023 CLINICAL DATA:  Lower GI bleed. EXAM: CT ABDOMEN AND PELVIS WITHOUT CONTRAST TECHNIQUE: Multidetector CT imaging of the abdomen and pelvis was performed following the standard protocol without IV contrast. RADIATION DOSE REDUCTION: This exam was performed according to the departmental dose-optimization program which includes automated exposure control, adjustment of the mA and/or kV according to patient size and/or use of iterative reconstruction technique. COMPARISON:  CT abdomen pelvis dated 07/17/2021. FINDINGS: Evaluation of this exam is limited in the absence of intravenous contrast. Lower chest: Left lung base linear atelectasis/scarring. The visualized lung bases are otherwise clear. There is coronary vascular calcification and calcification of the mitral annulus. No intra-abdominal free air or free fluid. Hepatobiliary: Similar  appearance of a fluid density which shaped area in the right lobe of the liver. The liver is otherwise unremarkable. No calcified gallstone or pericholecystic fluid. Pancreas: No active inflammatory changes. Spleen: Normal in size without focal abnormality. Adrenals/Urinary Tract: The adrenal glands unremarkable. Stable 3 cm right renal upper pole cyst. No imaging follow-up. There is no hydronephrosis or nephrolithiasis on either side. The visualized ureters and urinary bladder appear unremarkable. Stomach/Bowel: Postsurgical changes of partial sigmoid resection. There is moderate stool throughout the colon. There is scattered colonic diverticulosis without active inflammatory changes. There is no bowel obstruction or active inflammation. Appendectomy. Vascular/Lymphatic: Advanced aortoiliac atherosclerotic disease. The aorta is tortuous. There is a 3.2 cm infrarenal abdominal aortic aneurysm. The IVC is unremarkable. No portal venous gas. There is no adenopathy. Reproductive: Hysterectomy.  No adnexal masses. Other: Midline vertical anterior pelvic wall incisional scar. Musculoskeletal: Total left hip arthroplasty. Osteopenia with degenerative changes of the spine. Old T12 compression fracture with anterior wedging and vertebroplasty. No acute osseous pathology. IMPRESSION: 1. No acute intra-abdominal or pelvic pathology. 2. Colonic diverticulosis. 3. Constipation.  No bowel obstruction. 4. A 3.2 cm infrarenal abdominal aortic aneurysm. Recommend follow-up ultrasound every 3 years. 5.  Aortic Atherosclerosis (ICD10-I70.0). Electronically Signed   By: Elgie Collard M.D.   On: 04/02/2023 22:43    EKG: Independently reviewed. ***  Assessment and Plan  DVT prophylaxis: {Blank single:19197::"Lovenox","SQ Heparin","IV heparin gtt","Xarelto","Eliquis","Coumadin","SCDs","***"} Code Status: {Blank single:19197::"Full Code","DNR","DNR/DNI","Comfort Care","***"} Family Communication: ***  Consults called: ***   Level of care: {Blank single:19197::"Med-Surg","Telemetry bed","Progressive Care Unit","Step Down Unit"} Admission status: *** Time Spent: 75+ minutes***  John Giovanni MD [facility]  If 7PM-7AM, please contact night-coverage [email or web address]  04/03/2023, 12:01 AM

## 2023-04-03 NOTE — H&P (Signed)
History and Physical    [name] ZOX:[id number] DOB: 12-21-1922 DOA: 04/02/2023  PCP: Eloisa Northern, MD  Patient coming from: Home  Chief Complaint: Rectal bleeding  HPI: [name] is a 89 y.o. female with medical history significant of persistent A-fib no longer on anticoagulation due to history of left thalamic intracerebral hemorrhage in May 2023 with residual right leg weakness and gait/balance difficulties, mild cognitive impairment, hypertension, chronic HFpEF, anemia, orthostatic hypotension, history of DVT, GERD, hypothyroidism, melanoma, PVD, CKD stage IIIb.  Patient is followed by neurology and currently on aspirin for stroke prevention.  No longer on statin due to history of muscle cramps.  Patient presents to the ED today complaining of acute onset rectal bleeding/bright red blood per rectum and dizziness.  Blood pressure 158/97 on arrival to the ED, not tachycardic.  Labs showing WBC 8.5, hemoglobin 11.4 (previously in the 8-9 range on labs done in June 2023), MCV 91.4, platelet count 272k, sodium 140, potassium 4.0, chloride 110, bicarb 21, BUN 26, creatinine 1.5 (stable), glucose 100.  FOBT positive.  CT abdomen pelvis without contrast negative for acute finding but showing colonic diverticulosis and constipation without bowel obstruction.  Showing a 3.2 cm infrarenal abdominal aortic aneurysm for which radiologist is recommending follow-up ultrasound every 3 years.  Patient was given 1 L normal saline bolus.  ED physician has consulted [facility] (Dr. Marca Ancona).  [facility] called to admit.   Patient states she has been constipated and took MiraLAX at home which resulted in her having diarrhea for the past 2 days.  This evening around 6 PM she had 1 episode of large-volume bright red blood per rectum.  No recurrence of rectal bleeding since then.  Denies abdominal pain.  She felt dizzy after episode of rectal bleeding but denies any chest pain or shortness of breath at this time.  She  takes a baby aspirin daily and is no longer on anticoagulation for her A-fib due to history of prior brain bleed.  She reports history of colonoscopy over 10 years ago.  No other complaints.  Review of Systems:  Review of Systems  All other systems reviewed and are negative.   Past Medical History:  Diagnosis Date   Closed fracture of left distal radius 12/29/2019   DVT (deep venous thrombosis) (HCC) 09/2015   RLE   Dysrhythmia    Afib   GERD (gastroesophageal reflux disease)    Hip fracture (HCC) 12/02/2020   Hypertension    Hypothyroidism    Melanoma (HCC) 06/16/2017   Facial melanoma, removed by Dr. Alean Rinne   Peripheral vascular disease [facility])    peripheral neuropathy    Past Surgical History:  Procedure Laterality Date   ABDOMINAL HYSTERECTOMY     APPENDECTOMY     BACK SURGERY     BREAST SURGERY     left breast biopsy   CARDIOVERSION N/A 05/09/2020   Procedure: CARDIOVERSION;  Surgeon: Chilton Si, MD;  Location: [facility] ENDOSCOPY;  Service: Cardiovascular;  Laterality: N/A;   COLOSTOMY N/A 01/06/2020   Procedure: Colostomy;  Surgeon: Gaynelle Adu, MD;  Location: [facility] OR;  Service: General;  Laterality: N/A;   EYE SURGERY     cataract   HARDWARE REMOVAL Left 01/01/2020   Procedure: HARDWARE REMOVAL;  Surgeon: Bradly Bienenstock, MD;  Location: [facility] OR;  Service: Orthopedics;  Laterality: Left;   HIP ARTHROPLASTY Left 12/03/2020   Procedure: ARTHROPLASTY BIPOLAR HIP (HEMIARTHROPLASTY);  Surgeon: Sheral Apley, MD;  Location: [facility] OR;  Service: Orthopedics;  Laterality: Left;   HIP ARTHROPLASTY Left    partial   IR KYPHO EA ADDL LEVEL THORACIC OR LUMBAR  06/27/2021   IR RADIOLOGIST EVAL & MGMT  07/13/2021   LAPAROTOMY N/A 01/06/2020   Procedure: Exploratory Laparotomy, Open Sigmoid colectomy;  Surgeon: Gaynelle Adu, MD;  Location: [facility] OR;  Service: General;  Laterality: N/A;   LIPOMA EXCISION Left 04/25/2015   Procedure: EXCISION OF LIPOMA LEFT ARM;  Surgeon: Manus Rudd, MD;   Location: [facility];  Service: General;  Laterality: Left;   LUMBAR LAMINECTOMY/DECOMPRESSION MICRODISCECTOMY  11/20/2011   Procedure: LUMBAR LAMINECTOMY/DECOMPRESSION MICRODISCECTOMY;  Surgeon: Illene Labrador [place];  Location: [facility] ORS;  Service: Orthopedics;  Laterality: N/A;  Decompressive Laminectomy L2 to the Sacrum (X-Ray)   LYSIS OF ADHESION N/A 01/17/2021   Procedure: RIGID PROCTOSCOPY;  Surgeon: Karie Soda, MD;  Location: [facility] ORS;  Service: General;  Laterality: N/A;   OPEN REDUCTION INTERNAL FIXATION (ORIF) DISTAL RADIAL FRACTURE Left 01/01/2020   Procedure: OPEN REDUCTION INTERNAL FIXATION (ORIF) DISTAL RADIAL FRACTURE;  Surgeon: Bradly Bienenstock, MD;  Location: [facility] OR;  Service: Orthopedics;  Laterality: Left;   OTHER SURGICAL HISTORY     left wrist surgery - has plate in left wrist    OTHER SURGICAL HISTORY     right knee surgery due to torn cartilage    TONSILLECTOMY     WRIST FRACTURE SURGERY Left    XI ROBOTIC ASSISTED COLOSTOMY TAKEDOWN N/A 01/17/2021   Procedure: XI ROBOTIC ASSISTED OSTOMY TAKEDOWN, LYSIS OF ADHESIONS, RECTOSIGMOID RESECTION, BILATERAL TAP BLOCK;  Surgeon: Karie Soda, MD;  Location: [facility] ORS;  Service: General;  Laterality: N/A;     reports that she has never smoked. She has never used smokeless tobacco. She reports that she does not drink alcohol and does not use drugs.  Allergies  Allergen Reactions   Keflex [Cephalexin] Nausea Only and Other (See Comments)    Pt ended up in ER w/ CHEST PAIN.  Patient experienced chest pain again with re-challenge of cephalexin in May 2023.   Codeine Other (See Comments)    "Nightmares, imagined things"    Family History  Problem Relation Age of Onset   Cancer Mother        BREAST   COPD Father    Emphysema Father    Cancer Daughter        breast    Prior to Admission medications   Medication Sig Start Date End Date Taking? Authorizing Provider  acetaminophen (TYLENOL) 325 MG tablet Take 650 mg by mouth  every 6 (six) hours as needed.    [provider]  aspirin EC 81 MG tablet Take 1 tablet (81 mg total) by mouth daily. Swallow whole. 07/18/22   Micki Riley, MD  brimonidine (ALPHAGAN) 0.2 % ophthalmic solution Place 1 drop into the right eye 2 (two) times daily.    [provider]  CRANBERRY-VITAMIN C PO Take 30 mLs by mouth daily.    [provider]  diltiazem (CARDIZEM CD) 120 MG 24 hr capsule Take 1 capsule (120 mg total) by mouth every evening. 04/30/22   Setzer, Lynnell Jude, PA-C  dorzolamide-timolol (COSOPT) 2-0.5 % ophthalmic solution 1 drop 2 (two) times daily.    [provider]  gabapentin (NEURONTIN) 100 MG capsule Take 2 capsules (200 mg total) by mouth at bedtime. 04/30/22   Setzer, Lynnell Jude, PA-C  hydrocortisone cream 1 % Apply 1 Application topically 2 (two) times daily.    [provider]  hydroxypropyl methylcellulose / hypromellose (ISOPTO TEARS / GONIOVISC) 2.5 % ophthalmic solution 1 drop as needed for dry eyes.    [provider]  levothyroxine (SYNTHROID) 50 MCG tablet Take 1 tablet (50 mcg total) by mouth daily before breakfast. 04/30/22   Love, Evlyn Kanner, PA-C  loperamide (IMODIUM A-D) 2 MG tablet Take 2 mg by mouth 4 (four) times daily as needed for diarrhea or loose stools.    [provider]  melatonin 3 MG TABS tablet Take 3 mg by mouth at bedtime.    [provider]  Multiple Vitamins-Minerals (PRESERVISION AREDS 2 PO) Take 1 tablet by mouth daily with lunch.    [provider]  nadolol (CORGARD) 80 MG tablet Take 1 tablet (80 mg total) by mouth daily. 05/01/22   Setzer, Lynnell Jude, PA-C  nitroGLYCERIN (NITROSTAT) 0.4 MG SL tablet Place 1 tablet (0.4 mg total) under the tongue every 5 (five) minutes as needed for chest pain. 04/30/22   Setzer, Lynnell Jude, PA-C  ondansetron (ZOFRAN) 4 MG tablet Take 4 mg by mouth every 6 (six) hours as needed for nausea or vomiting.    [provider]   pantoprazole (PROTONIX) 40 MG tablet Take 1 tablet (40 mg total) by mouth daily. 04/30/22   Setzer, Lynnell Jude, PA-C  Polyethylene Glycol 3350 (MIRALAX PO) Take by mouth.    [provider]  polyvinyl alcohol (LIQUIFILM TEARS) 1.4 % ophthalmic solution Place 1 drop into the right eye as needed for dry eyes. 04/30/22   Setzer, Lynnell Jude, PA-C  predniSONE (DELTASONE) 1 MG tablet Take 2 tablets (2 mg total) by mouth daily with lunch. 04/30/22   Setzer, Lynnell Jude, PA-C  trimethoprim (TRIMPEX) 100 MG tablet Take 100 mg by mouth 2 (two) times daily.    [provider]  witch hazel-glycerin (TUCKS) pad Apply 1 Application topically as needed for itching.    [provider]    Physical Exam: Vitals:   04/02/23 2040 04/02/23 2041 04/02/23 2100 04/02/23 2130  BP: (!) 158/97  (!) 149/81 (!) 148/67  Pulse: 63  68 63  Resp: 16  17 (!) 21  Temp: 97.9 F (36.6 C)     TempSrc: Oral     SpO2: 99%  97% 98%  Weight:  61.2 kg    Height:  5\' 6"  (1.676 m)      Physical Exam Vitals reviewed.  Constitutional:      General: She is not in acute distress. HENT:     Head: Normocephalic and atraumatic.  Eyes:     Extraocular Movements: Extraocular movements intact.  Cardiovascular:     Rate and Rhythm: Normal rate and regular rhythm.     Pulses: Normal pulses.  Pulmonary:     Effort: Pulmonary effort is normal. No respiratory distress.     Breath sounds: Normal breath sounds. No wheezing or rales.  Abdominal:     General: Bowel sounds are normal. There is no distension.     Palpations: Abdomen is soft.     Tenderness: There is no abdominal tenderness.  Musculoskeletal:     Cervical back: Normal range of motion.     Right lower leg: No edema.     Left lower leg: No edema.  Skin:    General: Skin is warm and dry.  Neurological:     General: No focal deficit present.     Mental Status: She is alert and oriented to person, place, and time.     Labs on  Admission: I have  personally reviewed following labs and imaging studies  CBC: Recent Labs  Lab 04/02/23 2050 04/02/23 2120  WBC 8.5  --   NEUTROABS 4.3  --   HGB 11.4* 12.2  HCT 37.0 36.0  MCV 91.4  --   PLT 272  --    Basic Metabolic Panel: Recent Labs  Lab 04/02/23 2050 04/02/23 2120  NA 140 141  K 4.0 4.1  CL 110 110  CO2 21*  --   GLUCOSE 100* 95  BUN 26* 24*  CREATININE 1.50* 1.60*  CALCIUM 8.7*  --    GFR: Estimated Creatinine Clearance: 18.4 mL/min (A) (by C-G formula based on SCr of 1.6 mg/dL (H)). Liver Function Tests: Recent Labs  Lab 04/02/23 2050  AST 16  ALT 12  ALKPHOS 104  BILITOT 0.7  PROT 6.4*  ALBUMIN 3.5   No results for input(s): "LIPASE", "AMYLASE" in the last 168 hours. No results for input(s): "AMMONIA" in the last 168 hours. Coagulation Profile: No results for input(s): "INR", "PROTIME" in the last 168 hours. Cardiac Enzymes: No results for input(s): "CKTOTAL", "CKMB", "CKMBINDEX", "TROPONINI" in the last 168 hours. BNP (last 3 results) No results for input(s): "PROBNP" in the last 8760 hours. HbA1C: No results for input(s): "HGBA1C" in the last 72 hours. CBG: No results for input(s): "GLUCAP" in the last 168 hours. Lipid Profile: No results for input(s): "CHOL", "HDL", "LDLCALC", "TRIG", "CHOLHDL", "LDLDIRECT" in the last 72 hours. Thyroid Function Tests: No results for input(s): "TSH", "T4TOTAL", "FREET4", "T3FREE", "THYROIDAB" in the last 72 hours. Anemia Panel: No results for input(s): "VITAMINB12", "FOLATE", "FERRITIN", "TIBC", "IRON", "RETICCTPCT" in the last 72 hours. Urine analysis:    Component Value Date/Time   COLORURINE YELLOW 04/04/2022 1143   APPEARANCEUR CLEAR 04/04/2022 1143   LABSPEC 1.011 04/04/2022 1143   PHURINE 5.0 04/04/2022 1143   GLUCOSEU NEGATIVE 04/04/2022 1143   GLUCOSEU NEGATIVE 02/22/2016 0926   HGBUR NEGATIVE 04/04/2022 1143   BILIRUBINUR NEGATIVE 04/04/2022 1143   BILIRUBINUR positive 12/10/2021 1134    KETONESUR NEGATIVE 04/04/2022 1143   PROTEINUR NEGATIVE 04/04/2022 1143   UROBILINOGEN 4.0 (A) 12/10/2021 1134   UROBILINOGEN 0.2 02/22/2016 0926   NITRITE NEGATIVE 04/04/2022 1143   LEUKOCYTESUR NEGATIVE 04/04/2022 1143    Radiological Exams on Admission: CT ABDOMEN PELVIS WO CONTRAST  Result Date: 04/02/2023 CLINICAL DATA:  Lower GI bleed. EXAM: CT ABDOMEN AND PELVIS WITHOUT CONTRAST TECHNIQUE: Multidetector CT imaging of the abdomen and pelvis was performed following the standard protocol without IV contrast. RADIATION DOSE REDUCTION: This exam was performed according to the departmental dose-optimization program which includes automated exposure control, adjustment of the mA and/or kV according to patient size and/or use of iterative reconstruction technique. COMPARISON:  CT abdomen pelvis dated 07/17/2021. FINDINGS: Evaluation of this exam is limited in the absence of intravenous contrast. Lower chest: Left lung base linear atelectasis/scarring. The visualized lung bases are otherwise clear. There is coronary vascular calcification and calcification of the mitral annulus. No intra-abdominal free air or free fluid. Hepatobiliary: Similar appearance of a fluid density which shaped area in the right lobe of the liver. The liver is otherwise unremarkable. No calcified gallstone or pericholecystic fluid. Pancreas: No active inflammatory changes. Spleen: Normal in size without focal abnormality. Adrenals/Urinary Tract: The adrenal glands unremarkable. Stable 3 cm right renal upper pole cyst. No imaging follow-up. There is no hydronephrosis or nephrolithiasis on either side. The visualized ureters and urinary bladder appear unremarkable. Stomach/Bowel: Postsurgical changes of partial sigmoid resection.  There is moderate stool throughout the colon. There is scattered colonic diverticulosis without active inflammatory changes. There is no bowel obstruction or active inflammation. Appendectomy.  Vascular/Lymphatic: Advanced aortoiliac atherosclerotic disease. The aorta is tortuous. There is a 3.2 cm infrarenal abdominal aortic aneurysm. The IVC is unremarkable. No portal venous gas. There is no adenopathy. Reproductive: Hysterectomy.  No adnexal masses. Other: Midline vertical anterior pelvic wall incisional scar. Musculoskeletal: Total left hip arthroplasty. Osteopenia with degenerative changes of the spine. Old T12 compression fracture with anterior wedging and vertebroplasty. No acute osseous pathology. IMPRESSION: 1. No acute intra-abdominal or pelvic pathology. 2. Colonic diverticulosis. 3. Constipation.  No bowel obstruction. 4. A 3.2 cm infrarenal abdominal aortic aneurysm. Recommend follow-up ultrasound every 3 years. 5.  Aortic Atherosclerosis (ICD10-I70.0). Electronically Signed   By: Elgie Collard M.D.   On: 04/02/2023 22:43    Assessment and Plan  Acute lower GI bleed/hematochezia Patient reports 1 episode of large-volume bright red blood per rectum at home this evening.  No recurrence of hematochezia in the ED and remains hemodynamically stable.  Hemoglobin is stable.  FOBT positive.  No prior colonoscopy results in the chart.  Bleeding likely diverticular in nature based on CT findings.  Hold aspirin.  She is no longer on anticoagulation.  Type and screen, monitor H&H.  Transfuse PRBCs if hemoglobin less than 7.  Keep n.p.o. after midnight, gentle IV fluid hydration.  [facility] consulted.  Persistent A-fib No longer on anticoagulation due to history of ICH in May 2023.  Currently rate controlled.  Hypertension Systolic currently in the 140s.  Avoid antihypertensives at this time given acute GI bleed.  CKD stage IIIb Renal function stable, continue to monitor labs.  Incidental AAA CT showing a 3.2 cm infrarenal abdominal aortic aneurysm for which radiologist is recommending follow-up ultrasound every 3 years.   History of CVA and PVD Hold aspirin given acute GI bleed.   History of statin intolerance.  Chronic HFpEF Stable, no signs of volume overload.  Check BNP.   GERD Hypothyroidism Pharmacy med rec pending.  DVT prophylaxis: SCDs Code Status: Full Code (discussed with the patient and her family) Family Communication: Daughter and son at bedside. Level of care: Telemetry bed Admission status: It is my clinical opinion that referral for OBSERVATION is reasonable and necessary in this patient based on the above information provided. The aforementioned taken together are felt to place the patient at high risk for further clinical deterioration. However, it is anticipated that the patient may be medically stable for discharge from the hospital within 24 to 48 hours.   John Giovanni MD [facility]  If 7PM-7AM, please contact night-coverage [email or web address]  04/03/2023, 12:01 AM

## 2023-04-03 NOTE — Consult Note (Addendum)
Consultation  Referring Provider:  [facility]  Primary Care Physician:  Eloisa Northern, MD Primary Gastroenterologist:  Gentry Fitz       Reason for Consultation:  hematochezia     LOS: 0 days          HPI:   [name] is a 89 y.o. female with past medical history significant for A-fib, no longer on anticoagulation due to intracerebral hemorrhage May 2023), right-sided weakness/balance difficulties from intracerebral hemorrhage, mild cognitive impairment, chronic HFpEF, anemia, DVT, GERD, hypothyroidism, melanoma, PVD, CKD stage IIIb presents for evaluation of rectal bleeding.  Patient presents for acute onset rectal bleeding and dizziness.  Hgb 11.4 on admission.  BUN 26, creatinine 1.5 (stable).  FOBT positive.  CT abdomen pelvis without contrast shows colonic diverticulosis and constipation without bowel obstruction.  3.2 cm infrarenal abdominal aortic aneurysm.  Had 1 large-volume bright red blood per rectum associated with bowel movement 5/22 PM. Reports two episodes of bleeding that "filled toilet bowl".  After bowel movement she had associated dizziness. Has had no further Bms since presenting to hospital. Reports history of hemorrhoids with intermittent bleeding, but states she has never had this amount of bleeding.  Denies weight loss, nausea and vomiting. Reports some lower abdominal discomfort. Reports regular soft bms.  Takes baby ASA daily, no further NSAIDs. Denies alcohol and tobacco.  Reports history of colonoscopy with Dr. Kinnie Scales for screening more than 20 years ago (report noted in chart 1999, not available to view). Denies history of polyps. States colon was normal.  Patient with family at bedside, son (daughter on phone). Provided some of the history.   Past Medical History:  Diagnosis Date   Closed fracture of left distal radius 12/29/2019   DVT (deep venous thrombosis) (HCC) 09/2015   RLE   Dysrhythmia    Afib   GERD (gastroesophageal reflux disease)    Hip  fracture (HCC) 12/02/2020   Hypertension    Hypothyroidism    Melanoma (HCC) 06/16/2017   Facial melanoma, removed by Dr. Alean Rinne   Peripheral vascular disease [facility])    peripheral neuropathy    Surgical History:  She  has a past surgical history that includes Tonsillectomy; Appendectomy; Abdominal hysterectomy; Other surgical history; Other surgical history; Lumbar laminectomy/decompression microdiscectomy (11/20/2011); Back surgery; Eye surgery; Breast surgery; Wrist fracture surgery (Left); Lipoma excision (Left, 04/25/2015); Open reduction internal fixation (orif) distal radial fracture (Left, 01/01/2020); Hardware Removal (Left, 01/01/2020); laparotomy (N/A, 01/06/2020); Colostomy (N/A, 01/06/2020); Cardioversion (N/A, 05/09/2020); Hip Arthroplasty (Left, 12/03/2020); Hip Arthroplasty (Left); Xi robotic assisted colostomy takedown (N/A, 01/17/2021); Lysis of adhesion (N/A, 01/17/2021); IR KYPHO EA ADDL LEVEL THORACIC OR LUMBAR (06/27/2021); and IR Radiologist Eval & Mgmt (07/13/2021). Family History:  Her family history includes COPD in her father; Cancer in her daughter and mother; Emphysema in her father. Social History:   reports that she has never smoked. She has never used smokeless tobacco. She reports that she does not drink alcohol and does not use drugs.  Prior to Admission medications   Medication Sig Start Date End Date Taking? Authorizing Provider  acetaminophen (TYLENOL) 325 MG tablet Take 650 mg by mouth every 6 (six) hours as needed for mild pain or moderate pain.   Yes [provider]  aspirin EC 81 MG tablet Take 1 tablet (81 mg total) by mouth daily. Swallow whole. 07/18/22  Yes Micki Riley, MD  brimonidine (ALPHAGAN) 0.2 % ophthalmic solution Place 1 drop into the right eye 2 (two) times  daily.   Yes [provider]  diltiazem (CARDIZEM CD) 120 MG 24 hr capsule Take 1 capsule (120 mg total) by mouth every evening. 04/30/22  Yes Setzer, Lynnell Jude, PA-C  DORZOLAMIDE  HCL-TIMOLOL MAL OP Place 1 drop into the right eye in the morning and at bedtime. Cosopt 22.3 mg/6.8 mg/ml   Yes [provider]  gabapentin (NEURONTIN) 100 MG capsule Take 2 capsules (200 mg total) by mouth at bedtime. 04/30/22  Yes Setzer, Lynnell Jude, PA-C  hydrocortisone cream 1 % Apply 1 Application topically 3 (three) times daily as needed for itching.   Yes [provider]  hydroxypropyl methylcellulose / hypromellose (ISOPTO TEARS / GONIOVISC) 2.5 % ophthalmic solution Place 1 drop into the left eye 4 (four) times daily.   Yes [provider]  levothyroxine (SYNTHROID) 50 MCG tablet Take 1 tablet (50 mcg total) by mouth daily before breakfast. 04/30/22  Yes Love, Evlyn Kanner, PA-C  loperamide (IMODIUM A-D) 2 MG tablet Take 2 mg by mouth 4 (four) times daily as needed for diarrhea or loose stools.   Yes [provider]  losartan (COZAAR) 50 MG tablet Take 50 mg by mouth daily.   Yes [provider]  melatonin 3 MG TABS tablet Take 6 mg by mouth at bedtime.   Yes [provider]  Menthol, Topical Analgesic, (BIOFREEZE) 4 % GEL Apply 1 Application topically daily as needed (pain).   Yes [provider]  Multiple Vitamins-Minerals (PRESERVISION AREDS 2 PO) Take 1 tablet by mouth daily with lunch.   Yes [provider]  nadolol (CORGARD) 80 MG tablet Take 1 tablet (80 mg total) by mouth daily. 05/01/22  Yes Setzer, Lynnell Jude, PA-C  nitroGLYCERIN (NITROSTAT) 0.4 MG SL tablet Place 1 tablet (0.4 mg total) under the tongue every 5 (five) minutes as needed for chest pain. 04/30/22  Yes Setzer, Lynnell Jude, PA-C  ondansetron (ZOFRAN) 4 MG tablet Take 4 mg by mouth every 6 (six) hours as needed for nausea or vomiting.   Yes [provider]  pantoprazole (PROTONIX) 40 MG tablet Take 1 tablet (40 mg total) by mouth daily. 04/30/22  Yes Setzer, Lynnell Jude, PA-C  Pollen Extracts (PROSTAT PO) Take 30 mLs by mouth daily.   Yes [provider]  Polyethylene Glycol 3350 (MIRALAX PO) Take 1 packet by mouth daily.   Yes [provider]  predniSONE (DELTASONE) 1 MG tablet Take 2 tablets (2 mg total) by mouth daily with lunch. 04/30/22  Yes Setzer, Lynnell Jude, PA-C  trimethoprim (TRIMPEX) 100 MG tablet Take 100 mg by mouth in the morning.   Yes [provider]  witch hazel-glycerin (TUCKS) pad Apply 1 Application topically as needed for itching.   Yes [provider]  polyvinyl alcohol (LIQUIFILM TEARS) 1.4 % ophthalmic solution Place 1 drop into the right eye as needed for dry eyes. 04/30/22   Setzer, Lynnell Jude, PA-C    Current Facility-Administered Medications  Medication Dose Route Frequency Provider Last Rate Last Admin   acetaminophen (TYLENOL) tablet 650 mg  650 mg Oral Q6H PRN John Giovanni, MD   650 mg at 04/03/23 1035   Or   acetaminophen (TYLENOL) suppository 650 mg  650 mg Rectal Q6H PRN John Giovanni, MD       diltiazem (CARDIZEM CD) 24 hr capsule 120 mg  120 mg Oral QPM Briant Cedar, MD       metoprolol tartrate (LOPRESSOR) injection 2.5 mg  2.5 mg Intravenous Q8H PRN  Briant Cedar, MD   2.5 mg at 04/03/23 1037    Allergies as of 04/02/2023 - Review Complete 04/02/2023  Allergen Reaction Noted   Keflex [cephalexin] Nausea Only and Other (See Comments) 03/06/2016   Codeine Other (See Comments) 11/01/2011    Review of Systems  Constitutional:  Negative for chills, fever and weight loss.  HENT:  Negative for hearing loss and tinnitus.   Eyes:  Negative for blurred vision and double vision.  Respiratory:  Negative for cough and hemoptysis.   Cardiovascular:  Negative for chest pain and palpitations.  Gastrointestinal:  Positive for blood in stool. Negative for abdominal pain, constipation, diarrhea, heartburn, melena, nausea and vomiting.  Genitourinary:  Negative for dysuria and urgency.  Musculoskeletal:  Negative for myalgias and neck pain.  Skin:  Negative for  itching and rash.  Neurological:  Positive for dizziness. Negative for seizures and loss of consciousness.  Psychiatric/Behavioral:  Negative for depression and suicidal ideas.        Physical Exam:  Vital signs in last 24 hours: Temp:  [97.9 F (36.6 C)-98.2 F (36.8 C)] 98.1 F (36.7 C) (05/23 1207) Pulse Rate:  [56-103] 85 (05/23 1207) Resp:  [14-23] 20 (05/23 1207) BP: (116-188)/(62-105) 161/86 (05/23 1207) SpO2:  [93 %-99 %] 98 % (05/23 1207) Weight:  [61.2 kg] 61.2 kg (05/22 2041)   Last BM recorded by nurses in past 5 days No data recorded  Physical Exam Constitutional:      Appearance: Normal appearance. She is not ill-appearing.     Comments: Appears younger than stated age  HENT:     Nose: Nose normal. No congestion.     Mouth/Throat:     Mouth: Mucous membranes are moist.     Pharynx: Oropharynx is clear.  Eyes:     Extraocular Movements: Extraocular movements intact.     Conjunctiva/sclera: Conjunctivae normal.  Cardiovascular:     Rate and Rhythm: Normal rate and regular rhythm.  Pulmonary:     Effort: Pulmonary effort is normal. No respiratory distress.  Abdominal:     General: Abdomen is flat. Bowel sounds are normal. There is no distension.     Palpations: Abdomen is soft. There is no mass.     Tenderness: There is no abdominal tenderness. There is no guarding.     Hernia: No hernia is present.  Musculoskeletal:        General: No swelling. Normal range of motion.     Cervical back: Normal range of motion and neck supple.  Skin:    General: Skin is warm and dry.     Coloration: Skin is not jaundiced.  Neurological:     General: No focal deficit present.     Mental Status: She is alert and oriented to person, place, and time.  Psychiatric:        Mood and Affect: Mood normal.        Behavior: Behavior normal.        Thought Content: Thought content normal.        Judgment: Judgment normal.      LAB RESULTS: Recent Labs    04/02/23 2050  04/02/23 2120 04/03/23 0600  WBC 8.5  --  9.4  HGB 11.4* 12.2 11.1*  HCT 37.0 36.0 35.3*  PLT 272  --  257   BMET Recent Labs    04/02/23 2050 04/02/23 2120 04/03/23 0600  NA 140 141 142  K 4.0 4.1 4.0  CL 110 110 115*  CO2 21*  --  20*  GLUCOSE 100* 95 95  BUN 26* 24* 19  CREATININE 1.50* 1.60* 1.37*  CALCIUM 8.7*  --  8.5*   LFT Recent Labs    04/02/23 2050  PROT 6.4*  ALBUMIN 3.5  AST 16  ALT 12  ALKPHOS 104  BILITOT 0.7   PT/INR No results for input(s): "LABPROT", "INR" in the last 72 hours.  STUDIES: CT ABDOMEN PELVIS WO CONTRAST  Result Date: 04/02/2023 CLINICAL DATA:  Lower GI bleed. EXAM: CT ABDOMEN AND PELVIS WITHOUT CONTRAST TECHNIQUE: Multidetector CT imaging of the abdomen and pelvis was performed following the standard protocol without IV contrast. RADIATION DOSE REDUCTION: This exam was performed according to the departmental dose-optimization program which includes automated exposure control, adjustment of the mA and/or kV according to patient size and/or use of iterative reconstruction technique. COMPARISON:  CT abdomen pelvis dated 07/17/2021. FINDINGS: Evaluation of this exam is limited in the absence of intravenous contrast. Lower chest: Left lung base linear atelectasis/scarring. The visualized lung bases are otherwise clear. There is coronary vascular calcification and calcification of the mitral annulus. No intra-abdominal free air or free fluid. Hepatobiliary: Similar appearance of a fluid density which shaped area in the right lobe of the liver. The liver is otherwise unremarkable. No calcified gallstone or pericholecystic fluid. Pancreas: No active inflammatory changes. Spleen: Normal in size without focal abnormality. Adrenals/Urinary Tract: The adrenal glands unremarkable. Stable 3 cm right renal upper pole cyst. No imaging follow-up. There is no hydronephrosis or nephrolithiasis on either side. The visualized ureters and urinary bladder appear  unremarkable. Stomach/Bowel: Postsurgical changes of partial sigmoid resection. There is moderate stool throughout the colon. There is scattered colonic diverticulosis without active inflammatory changes. There is no bowel obstruction or active inflammation. Appendectomy. Vascular/Lymphatic: Advanced aortoiliac atherosclerotic disease. The aorta is tortuous. There is a 3.2 cm infrarenal abdominal aortic aneurysm. The IVC is unremarkable. No portal venous gas. There is no adenopathy. Reproductive: Hysterectomy.  No adnexal masses. Other: Midline vertical anterior pelvic wall incisional scar. Musculoskeletal: Total left hip arthroplasty. Osteopenia with degenerative changes of the spine. Old T12 compression fracture with anterior wedging and vertebroplasty. No acute osseous pathology. IMPRESSION: 1. No acute intra-abdominal or pelvic pathology. 2. Colonic diverticulosis. 3. Constipation.  No bowel obstruction. 4. A 3.2 cm infrarenal abdominal aortic aneurysm. Recommend follow-up ultrasound every 3 years. 5.  Aortic Atherosclerosis (ICD10-I70.0). Electronically Signed   By: Elgie Collard M.D.   On: 04/02/2023 22:43      Impression    Acute lower GI bleed -Hgb 11.1 (9.0 04/2022) -BUN 19, creatinine 1.37, GFR 35 -Fecal occult positive .Patient would like to go home soon. No further bleeding, stable hgb. Suspect diverticular bleed versus hemorrhoidal. CT shows diverticulosis and constipation. Patient and son would prefer to avoid invasive procedures, this is appropriate with age and comorbidities. Suspect minimal bloating/discomfort is associated with constipation seen on CT  Persistent A-fib -On baby aspirin daily  CKD stage IIIb    Plan   - recommend staying one more night for observation. If no further bleeding and stable hgb can consider discharge tomorrow - if recurrent large volume hematochezia (multiple episodes in short time frame), would recommend stat CTA and if positive, IR  embolization -Continue daily CBC and transfuse as needed to maintain HGB > 7  -can have diet - use miralax prn for constipation  Thank you for your kind consultation, we will continue to follow.   Bayley Leanna Sato  04/03/2023, 1:45 PM    Attending physician's note  I have taken a history, reviewed the chart and examined the patient. I performed a substantive portion of this encounter, including complete performance of at least one of the key components, in conjunction with the APP. I agree with the APP's note, impression and recommendations.    89 year old very pleasant female with history of A-fib, off anticoagulation since intracerebral hemorrhage in May 2023, history of DVT, CKD stage III admitted with small-volume rectal bleeding  Hemoglobin 11.4 Hemodynamically stable  CT abdomen pelvis showed colonic diverticulosis, no evidence of acute diverticulitis  Possible etiology acute diverticular hemorrhage or bleeding from internal hemorrhoids Plan to manage conservatively, will hold off diagnostic colonoscopy unless we will need to intervene endoscopically if she has ongoing GI bleed Anusol suppository at bedtime for 7 days  Continue soft diet as tolerated Use MiraLAX daily to improve constipation  If no further bleeding, hemoglobin remained stable okay to discharge patient home tomorrow   The patient was provided an opportunity to ask questions and all were answered. The patient agreed with the plan and demonstrated an understanding of the instructions.  Iona Beard , MD [phone]

## 2023-04-03 NOTE — ED Notes (Signed)
ED TO INPATIENT HANDOFF REPORT  Name/Age/Gender [name] 89 y.o. female  Code Status    Code Status Orders  (From admission, onward)           Start     Ordered   04/03/23 0020  Full code  Continuous       Question:  By:  Answer:  Consent: discussion documented in EHR   04/03/23 0021           Code Status History     Date Active Date Inactive Code Status Order ID Comments User Context   04/02/2022 1655 04/30/2022 2056 Full Code [id number]  Carlos Levering Inpatient   04/02/2022 1655 04/02/2022 1655 Full Code [id number]  Carlos Levering Inpatient   03/25/2022 0250 04/02/2022 1645 Full Code [id number]  Erick Blinks, MD ED   01/17/2021 1541 01/21/2021 1729 Full Code [id number]  Karie Soda, MD Inpatient   12/02/2020 2159 12/07/2020 2137 Full Code [id number]  Dorcas Carrow, MD ED   05/19/2020 0350 05/24/2020 1630 Full Code [id number]  Hillary Bow, DO ED   05/11/2020 1609 05/13/2020 1829 Full Code [id number]  Emeline General, MD ED   01/14/2020 1613 01/20/2020 1830 Full Code [id number]  Jacquelynn Cree, PA-C Inpatient   12/29/2019 1953 01/14/2020 1548 Full Code [id number]  OpydLavone Neri, MD Inpatient       Home/SNF/Other ALF  Chief Complaint Acute lower GI bleeding [K92.2]  Level of Care/Admitting Diagnosis ED Disposition     ED Disposition  Admit   Condition  --   Comment  Hospital Area: [facility] [[id number]]  Level of Care: Telemetry [5]  Admit to tele based on following criteria: Other see comments  Comments: Acute GIB  May place patient in observation at [facility] or [facility] if equivalent level of care is available:: Yes  Covid Evaluation: Asymptomatic - no recent exposure (last 10 days) testing not required  Diagnosis: Acute lower GI bleeding [188416]  Admitting Physician: John Giovanni [[id number]]  Attending Physician: John Giovanni [[id number]]          Medical History Past Medical History:  Diagnosis Date    Closed fracture of left distal radius 12/29/2019   DVT (deep venous thrombosis) (HCC) 09/2015   RLE   Dysrhythmia    Afib   GERD (gastroesophageal reflux disease)    Hip fracture (HCC) 12/02/2020   Hypertension    Hypothyroidism    Melanoma (HCC) 06/16/2017   Facial melanoma, removed by Dr. Alean Rinne   Peripheral vascular disease [facility])    peripheral neuropathy    Allergies Allergies  Allergen Reactions   Keflex [Cephalexin] Nausea Only and Other (See Comments)    Pt ended up in ER w/ CHEST PAIN.  Patient experienced chest pain again with re-challenge of cephalexin in May 2023.   Codeine Other (See Comments)    "Nightmares, imagined things"    IV Location/Drains/Wounds Patient Lines/Drains/Airways Status     Active Line/Drains/Airways     Name Placement date Placement time Site Days   Peripheral IV 04/02/23 20 G 1" Anterior;Left Forearm 04/02/23  2044  Forearm  1   Wound / Incision (Open or Dehisced) 04/21/22 Skin tear Arm Lower;Posterior;Right skin tear with skin flap in place 04/21/22  1530  Arm  347            Labs/Imaging Results for orders placed or performed during the hospital encounter of 04/02/23 (from the past 48 hour(s))  CBC with Differential  Status: Abnormal   Collection Time: 04/02/23  8:50 PM  Result Value Ref Range   WBC 8.5 4.0 - 10.5 K/uL   RBC 4.05 3.87 - 5.11 MIL/uL   Hemoglobin 11.4 (L) 12.0 - 15.0 g/dL   HCT 78.2 95.6 - 21.3 %   MCV 91.4 80.0 - 100.0 fL   MCH 28.1 26.0 - 34.0 pg   MCHC 30.8 30.0 - 36.0 g/dL   RDW 08.6 (H) 57.8 - 46.9 %   Platelets 272 150 - 400 K/uL   nRBC 0.0 0.0 - 0.2 %   Neutrophils Relative % 51 %   Neutro Abs 4.3 1.7 - 7.7 K/uL   Lymphocytes Relative 33 %   Lymphs Abs 2.8 0.7 - 4.0 K/uL   Monocytes Relative 12 %   Monocytes Absolute 1.0 0.1 - 1.0 K/uL   Eosinophils Relative 3 %   Eosinophils Absolute 0.3 0.0 - 0.5 K/uL   Basophils Relative 0 %   Basophils Absolute 0.0 0.0 - 0.1 K/uL   Immature Granulocytes 1 %    Abs Immature Granulocytes 0.09 (H) 0.00 - 0.07 K/uL    Comment: Performed at [facility], [address] [place], [place] [address]  Comprehensive metabolic panel     Status: Abnormal   Collection Time: 04/02/23  8:50 PM  Result Value Ref Range   Sodium 140 135 - 145 mmol/L   Potassium 4.0 3.5 - 5.1 mmol/L   Chloride 110 98 - 111 mmol/L   CO2 21 (L) 22 - 32 mmol/L   Glucose, Bld 100 (H) 70 - 99 mg/dL    Comment: Glucose reference range applies only to samples taken after fasting for at least 8 hours.   BUN 26 (H) 8 - 23 mg/dL   Creatinine, Ser 8.41 (H) 0.44 - 1.00 mg/dL   Calcium 8.7 (L) 8.9 - 10.3 mg/dL   Total Protein 6.4 (L) 6.5 - 8.1 g/dL   Albumin 3.5 3.5 - 5.0 g/dL   AST 16 15 - 41 U/L   ALT 12 0 - 44 U/L   Alkaline Phosphatase 104 38 - 126 U/L   Total Bilirubin 0.7 0.3 - 1.2 mg/dL   GFR, Estimated 31 (L) >60 mL/min    Comment: (NOTE) Calculated using the CKD-EPI Creatinine Equation (2021)    Anion gap 9 5 - 15    Comment: Performed at [facility], [address], [place], [place] [address]  POC occult blood, ED     Status: Abnormal   Collection Time: 04/02/23  9:18 PM  Result Value Ref Range   Fecal Occult Bld POSITIVE (A) NEGATIVE  I-stat chem 8, ED (not at [facility], [facility] or [facility])     Status: Abnormal   Collection Time: 04/02/23  9:20 PM  Result Value Ref Range   Sodium 141 135 - 145 mmol/L   Potassium 4.1 3.5 - 5.1 mmol/L   Chloride 110 98 - 111 mmol/L   BUN 24 (H) 8 - 23 mg/dL   Creatinine, Ser 1.02 (H) 0.44 - 1.00 mg/dL   Glucose, Bld 95 70 - 99 mg/dL    Comment: Glucose reference range applies only to samples taken after fasting for at least 8 hours.   Calcium, Ion 1.20 1.15 - 1.40 mmol/L   TCO2 21 (L) 22 - 32 mmol/L   Hemoglobin 12.2 12.0 - 15.0 g/dL   HCT 72.5 36.6 - 44.0 %  Type and screen [facility]     Status: None  Collection Time: 04/03/23 12:50 AM  Result Value Ref Range   ABO/RH(D) O POS     Antibody Screen NEG    Sample Expiration      04/06/2023,2359 Performed at [facility], [address], [place], [place] [address]   CBC     Status: Abnormal   Collection Time: 04/03/23  6:00 AM  Result Value Ref Range   WBC 9.4 4.0 - 10.5 K/uL   RBC 3.90 3.87 - 5.11 MIL/uL   Hemoglobin 11.1 (L) 12.0 - 15.0 g/dL   HCT 60.4 (L) 54.0 - 98.1 %   MCV 90.5 80.0 - 100.0 fL   MCH 28.5 26.0 - 34.0 pg   MCHC 31.4 30.0 - 36.0 g/dL   RDW 19.1 (H) 47.8 - 29.5 %   Platelets 257 150 - 400 K/uL   nRBC 0.0 0.0 - 0.2 %    Comment: Performed at [facility], [address] [place], [place] [address]  Basic metabolic panel     Status: Abnormal   Collection Time: 04/03/23  6:00 AM  Result Value Ref Range   Sodium 142 135 - 145 mmol/L   Potassium 4.0 3.5 - 5.1 mmol/L   Chloride 115 (H) 98 - 111 mmol/L   CO2 20 (L) 22 - 32 mmol/L   Glucose, Bld 95 70 - 99 mg/dL    Comment: Glucose reference range applies only to samples taken after fasting for at least 8 hours.   BUN 19 8 - 23 mg/dL   Creatinine, Ser 8.65 (H) 0.44 - 1.00 mg/dL   Calcium 8.5 (L) 8.9 - 10.3 mg/dL   GFR, Estimated 35 (L) >60 mL/min    Comment: (NOTE) Calculated using the CKD-EPI Creatinine Equation (2021)    Anion gap 7 5 - 15    Comment: Performed at [facility], [address] [place], [place] [address]  Brain natriuretic peptide     Status: Abnormal   Collection Time: 04/03/23  6:00 AM  Result Value Ref Range   B Natriuretic Peptide 386.1 (H) 0.0 - 100.0 pg/mL    Comment: Performed at [facility], [address] [place], [place] [address]   CT ABDOMEN PELVIS WO CONTRAST  Result Date: 04/02/2023 CLINICAL DATA:  Lower GI bleed. EXAM: CT ABDOMEN AND PELVIS WITHOUT CONTRAST TECHNIQUE: Multidetector CT imaging of the abdomen and pelvis was performed following the standard protocol without IV contrast. RADIATION DOSE REDUCTION: This exam was performed  according to the departmental dose-optimization program which includes automated exposure control, adjustment of the mA and/or kV according to patient size and/or use of iterative reconstruction technique. COMPARISON:  CT abdomen pelvis dated 07/17/2021. FINDINGS: Evaluation of this exam is limited in the absence of intravenous contrast. Lower chest: Left lung base linear atelectasis/scarring. The visualized lung bases are otherwise clear. There is coronary vascular calcification and calcification of the mitral annulus. No intra-abdominal free air or free fluid. Hepatobiliary: Similar appearance of a fluid density which shaped area in the right lobe of the liver. The liver is otherwise unremarkable. No calcified gallstone or pericholecystic fluid. Pancreas: No active inflammatory changes. Spleen: Normal in size without focal abnormality. Adrenals/Urinary Tract: The adrenal glands unremarkable. Stable 3 cm right renal upper pole cyst. No imaging follow-up. There is no hydronephrosis or nephrolithiasis on either side. The visualized ureters and urinary bladder appear unremarkable. Stomach/Bowel: Postsurgical changes of partial sigmoid resection. There is moderate stool throughout the colon. There is scattered colonic diverticulosis without  active inflammatory changes. There is no bowel obstruction or active inflammation. Appendectomy. Vascular/Lymphatic: Advanced aortoiliac atherosclerotic disease. The aorta is tortuous. There is a 3.2 cm infrarenal abdominal aortic aneurysm. The IVC is unremarkable. No portal venous gas. There is no adenopathy. Reproductive: Hysterectomy.  No adnexal masses. Other: Midline vertical anterior pelvic wall incisional scar. Musculoskeletal: Total left hip arthroplasty. Osteopenia with degenerative changes of the spine. Old T12 compression fracture with anterior wedging and vertebroplasty. No acute osseous pathology. IMPRESSION: 1. No acute intra-abdominal or pelvic pathology. 2. Colonic  diverticulosis. 3. Constipation.  No bowel obstruction. 4. A 3.2 cm infrarenal abdominal aortic aneurysm. Recommend follow-up ultrasound every 3 years. 5.  Aortic Atherosclerosis (ICD10-I70.0). Electronically Signed   By: Elgie Collard M.D.   On: 04/02/2023 22:43    Pending Labs Unresulted Labs (From admission, onward)    None       Vitals/Pain Today's Vitals   04/03/23 0630 04/03/23 0845 04/03/23 0900 04/03/23 0930  BP: (!) 176/72  (!) 170/88 (!) 170/88  Pulse: 66  67 66  Resp: 16  (!) 23 17  Temp:  98.2 F (36.8 C)    TempSrc:  Oral    SpO2: 96%  99% 97%  Weight:      Height:      PainSc:        Isolation Precautions No active isolations  Medications Medications  acetaminophen (TYLENOL) tablet 650 mg (650 mg Oral Given 04/03/23 1035)    Or  acetaminophen (TYLENOL) suppository 650 mg ( Rectal See Alternative 04/03/23 1035)  0.9 %  sodium chloride infusion ( Intravenous New Bag/Given 04/03/23 0459)  metoprolol tartrate (LOPRESSOR) injection 2.5 mg (2.5 mg Intravenous Given 04/03/23 1037)  sodium chloride 0.9 % bolus 1,000 mL (0 mLs Intravenous Stopped 04/02/23 2325)    Mobility non-ambulatory

## 2023-04-03 NOTE — Progress Notes (Signed)
PROGRESS NOTE  [name] UJW:[id number] DOB: 1923/06/09 DOA: 04/02/2023 PCP: Eloisa Northern, MD  HPI/Recap of past 24 hours: [name] is a 89 y.o. female with medical history significant of persistent A-fib no longer on anticoagulation due to history of left thalamic intracerebral hemorrhage in May 2023 with residual right leg weakness and gait/balance difficulties, mild cognitive impairment, hypertension, chronic HFpEF, anemia, orthostatic hypotension, history of DVT, GERD, hypothyroidism, melanoma, PVD, CKD stage IIIb.  Patient is followed by neurology and currently on aspirin for stroke prevention.    Patient presents to the ED complaining of acute onset rectal bleeding/bright red blood per rectum and dizziness. In the ED, FOBT positive. CT abdomen/pelvis negative for acute finding but showing colonic diverticulosis and constipation without bowel obstruction. She reports history of colonoscopy over 10 years ago.  Patient admitted for further management.    Today, patient denies any further rectal bleeding.  Patient denies any chest pain, abdominal pain, nausea/vomiting, fever/chills, headaches, no obvious new focal neurologic deficits   Assessment/Plan: Principal Problem:   Acute lower GI bleeding Active Problems:   Hypothyroidism   HTN (hypertension)   Persistent atrial fibrillation (HCC)   Hematochezia   AAA (abdominal aortic aneurysm) (HCC)   CKD stage 3b, GFR 30-44 ml/min (HCC)   Acute lower GI bleed/hematochezia Possibly diverticular bleeding Reported 1 episode of large-volume bright red blood per rectum prior to arrival None so far, remains hemodynamically stable, FOBT positive Hemoglobin is stable GI consulted, appreciate recs Hold aspirin, Type and screen, monitor H&H.  Transfuse PRBCs if hemoglobin less than 7   Persistent A-fib No longer on anticoagulation due to history of ICH in May 2023 Currently rate controlled Continue diltiazem for now    Hypertension BP uncontrolled Continue to hold losartan, nadolol due to recent GI bleed Monitor closely   CKD stage IIIb Renal function stable, continue to monitor labs.   Incidental AAA CT showing a 3.2 cm infrarenal abdominal aortic aneurysm for which radiologist is recommending follow-up ultrasound every 3 years.    History of CVA and PVD Hold aspirin given acute GI bleed.  History of statin intolerance.   Chronic HFpEF BNP 386 Stable, no signs of volume overload   GERD Pantoprazole  Hypothyroidism Continue brand name Synthroid which we provided by patient    Estimated body mass index is 21.79 kg/m as calculated from the following:   Height as of this encounter: 5\' 6"  (1.676 m).   Weight as of this encounter: 61.2 kg.     Code Status: Full  Family Communication: Discussed with daughter at bedside  Disposition Plan: Status is: Observation The patient will require care spanning > 2 midnights and should be moved to inpatient because: Level of care      Consultants: GI  Procedures: None  Antimicrobials: None  DVT prophylaxis: SCDs   Objective: Vitals:   04/03/23 0930 04/03/23 1030 04/03/23 1100 04/03/23 1207  BP: (!) 170/88 (!) 188/105 (!) 162/82 (!) 161/86  Pulse: 66 86 75 85  Resp: 17 18 (!) 21 20  Temp:    98.1 F (36.7 C)  TempSrc:    Oral  SpO2: 97% 96% 98% 98%  Weight:      Height:       No intake or output data in the 24 hours ending 04/03/23 1458 Filed Weights   04/02/23 2041  Weight: 61.2 kg    Exam: General: NAD  Cardiovascular: S1, S2 present Respiratory: CTAB Abdomen: Soft, nontender, nondistended, bowel sounds present Musculoskeletal:  No bilateral pedal edema noted Skin: Normal Psychiatry: Normal mood  Neurology: No obvious new focal neurologic deficits    Data Reviewed: CBC: Recent Labs  Lab 04/02/23 2050 04/02/23 2120 04/03/23 0600  WBC 8.5  --  9.4  NEUTROABS 4.3  --   --   HGB 11.4* 12.2 11.1*  HCT 37.0  36.0 35.3*  MCV 91.4  --  90.5  PLT 272  --  257   Basic Metabolic Panel: Recent Labs  Lab 04/02/23 2050 04/02/23 2120 04/03/23 0600  NA 140 141 142  K 4.0 4.1 4.0  CL 110 110 115*  CO2 21*  --  20*  GLUCOSE 100* 95 95  BUN 26* 24* 19  CREATININE 1.50* 1.60* 1.37*  CALCIUM 8.7*  --  8.5*   GFR: Estimated Creatinine Clearance: 21.5 mL/min (A) (by C-G formula based on SCr of 1.37 mg/dL (H)). Liver Function Tests: Recent Labs  Lab 04/02/23 2050  AST 16  ALT 12  ALKPHOS 104  BILITOT 0.7  PROT 6.4*  ALBUMIN 3.5   No results for input(s): "LIPASE", "AMYLASE" in the last 168 hours. No results for input(s): "AMMONIA" in the last 168 hours. Coagulation Profile: No results for input(s): "INR", "PROTIME" in the last 168 hours. Cardiac Enzymes: No results for input(s): "CKTOTAL", "CKMB", "CKMBINDEX", "TROPONINI" in the last 168 hours. BNP (last 3 results) No results for input(s): "PROBNP" in the last 8760 hours. HbA1C: No results for input(s): "HGBA1C" in the last 72 hours. CBG: No results for input(s): "GLUCAP" in the last 168 hours. Lipid Profile: No results for input(s): "CHOL", "HDL", "LDLCALC", "TRIG", "CHOLHDL", "LDLDIRECT" in the last 72 hours. Thyroid Function Tests: No results for input(s): "TSH", "T4TOTAL", "FREET4", "T3FREE", "THYROIDAB" in the last 72 hours. Anemia Panel: No results for input(s): "VITAMINB12", "FOLATE", "FERRITIN", "TIBC", "IRON", "RETICCTPCT" in the last 72 hours. Urine analysis:    Component Value Date/Time   COLORURINE YELLOW 04/04/2022 1143   APPEARANCEUR CLEAR 04/04/2022 1143   LABSPEC 1.011 04/04/2022 1143   PHURINE 5.0 04/04/2022 1143   GLUCOSEU NEGATIVE 04/04/2022 1143   GLUCOSEU NEGATIVE 02/22/2016 0926   HGBUR NEGATIVE 04/04/2022 1143   BILIRUBINUR NEGATIVE 04/04/2022 1143   BILIRUBINUR positive 12/10/2021 1134   KETONESUR NEGATIVE 04/04/2022 1143   PROTEINUR NEGATIVE 04/04/2022 1143   UROBILINOGEN 4.0 (A) 12/10/2021 1134    UROBILINOGEN 0.2 02/22/2016 0926   NITRITE NEGATIVE 04/04/2022 1143   LEUKOCYTESUR NEGATIVE 04/04/2022 1143   Sepsis Labs: @LABRCNTIP (procalcitonin:4,lacticidven:4)  )No results found for this or any previous visit (from the past 240 hour(s)).    Studies: CT ABDOMEN PELVIS WO CONTRAST  Result Date: 04/02/2023 CLINICAL DATA:  Lower GI bleed. EXAM: CT ABDOMEN AND PELVIS WITHOUT CONTRAST TECHNIQUE: Multidetector CT imaging of the abdomen and pelvis was performed following the standard protocol without IV contrast. RADIATION DOSE REDUCTION: This exam was performed according to the departmental dose-optimization program which includes automated exposure control, adjustment of the mA and/or kV according to patient size and/or use of iterative reconstruction technique. COMPARISON:  CT abdomen pelvis dated 07/17/2021. FINDINGS: Evaluation of this exam is limited in the absence of intravenous contrast. Lower chest: Left lung base linear atelectasis/scarring. The visualized lung bases are otherwise clear. There is coronary vascular calcification and calcification of the mitral annulus. No intra-abdominal free air or free fluid. Hepatobiliary: Similar appearance of a fluid density which shaped area in the right lobe of the liver. The liver is otherwise unremarkable. No calcified gallstone or pericholecystic fluid. Pancreas: No active inflammatory  changes. Spleen: Normal in size without focal abnormality. Adrenals/Urinary Tract: The adrenal glands unremarkable. Stable 3 cm right renal upper pole cyst. No imaging follow-up. There is no hydronephrosis or nephrolithiasis on either side. The visualized ureters and urinary bladder appear unremarkable. Stomach/Bowel: Postsurgical changes of partial sigmoid resection. There is moderate stool throughout the colon. There is scattered colonic diverticulosis without active inflammatory changes. There is no bowel obstruction or active inflammation. Appendectomy.  Vascular/Lymphatic: Advanced aortoiliac atherosclerotic disease. The aorta is tortuous. There is a 3.2 cm infrarenal abdominal aortic aneurysm. The IVC is unremarkable. No portal venous gas. There is no adenopathy. Reproductive: Hysterectomy.  No adnexal masses. Other: Midline vertical anterior pelvic wall incisional scar. Musculoskeletal: Total left hip arthroplasty. Osteopenia with degenerative changes of the spine. Old T12 compression fracture with anterior wedging and vertebroplasty. No acute osseous pathology. IMPRESSION: 1. No acute intra-abdominal or pelvic pathology. 2. Colonic diverticulosis. 3. Constipation.  No bowel obstruction. 4. A 3.2 cm infrarenal abdominal aortic aneurysm. Recommend follow-up ultrasound every 3 years. 5.  Aortic Atherosclerosis (ICD10-I70.0). Electronically Signed   By: Elgie Collard M.D.   On: 04/02/2023 22:43    Scheduled Meds:  diltiazem  120 mg Oral QPM    Continuous Infusions:   LOS: 0 days     Briant Cedar, MD [facility]  If 7PM-7AM, please contact night-coverage [email or web address] 04/03/2023, 2:58 PM

## 2023-04-04 DIAGNOSIS — K922 Gastrointestinal hemorrhage, unspecified: Secondary | ICD-10-CM | POA: Diagnosis not present

## 2023-04-04 LAB — CBC WITH DIFFERENTIAL/PLATELET
Abs Immature Granulocytes: 0.04 10*3/uL (ref 0.00–0.07)
Basophils Absolute: 0 10*3/uL (ref 0.0–0.1)
Basophils Relative: 0 %
Eosinophils Absolute: 0.4 10*3/uL (ref 0.0–0.5)
Eosinophils Relative: 4 %
HCT: 36.8 % (ref 36.0–46.0)
Hemoglobin: 11.5 g/dL — ABNORMAL LOW (ref 12.0–15.0)
Immature Granulocytes: 0 %
Lymphocytes Relative: 31 %
Lymphs Abs: 2.9 10*3/uL (ref 0.7–4.0)
MCH: 28.1 pg (ref 26.0–34.0)
MCHC: 31.3 g/dL (ref 30.0–36.0)
MCV: 90 fL (ref 80.0–100.0)
Monocytes Absolute: 0.9 10*3/uL (ref 0.1–1.0)
Monocytes Relative: 10 %
Neutro Abs: 5.1 10*3/uL (ref 1.7–7.7)
Neutrophils Relative %: 55 %
Platelets: 253 10*3/uL (ref 150–400)
RBC: 4.09 MIL/uL (ref 3.87–5.11)
RDW: 17 % — ABNORMAL HIGH (ref 11.5–15.5)
WBC: 9.3 10*3/uL (ref 4.0–10.5)
nRBC: 0 % (ref 0.0–0.2)

## 2023-04-04 LAB — BASIC METABOLIC PANEL
Anion gap: 6 (ref 5–15)
BUN: 17 mg/dL (ref 8–23)
CO2: 22 mmol/L (ref 22–32)
Calcium: 8.9 mg/dL (ref 8.9–10.3)
Chloride: 111 mmol/L (ref 98–111)
Creatinine, Ser: 1.19 mg/dL — ABNORMAL HIGH (ref 0.44–1.00)
GFR, Estimated: 41 mL/min — ABNORMAL LOW (ref >60)
Glucose, Bld: 99 mg/dL (ref 70–99)
Potassium: 4.1 mmol/L (ref 3.5–5.1)
Sodium: 139 mmol/L (ref 135–145)

## 2023-04-04 MED ORDER — HYDROCORTISONE ACETATE 25 MG RE SUPP: 25.0000 mg | Freq: Every day | RECTAL | 0 refills | Status: AC

## 2023-04-04 NOTE — TOC Transition Note (Signed)
Transition of Care [facility]) - CM/SW Discharge Note   Patient Details  Name: [name] MRN: [id number] Date of Birth: 1923/05/22  Transition of Care [facility]) CM/SW Contact:  Otelia Santee, LCSW Phone Number: 04/04/2023, 11:27 AM   Clinical Narrative:    Pt to return to [facility] where she is a LTC resident. Pt will be going to room 211a. RN to call report to [phone]. [facility] called at 11:20am for transportation.    Final next level of care: Long Term Nursing Home Barriers to Discharge: No Barriers Identified   Patient Goals and CMS Choice CMS Medicare.gov Compare Post Acute Care list provided to:: Patient Choice offered to / list presented to : Patient  Discharge Placement                Patient chooses bed at: [facility] Patient to be transferred to facility by: [facility] Name of family member notified: Pt and spouse Patient and family notified of of transfer: 04/04/23  Discharge Plan and Services Additional resources added to the After Visit Summary for                  DME Arranged: N/A DME Agency: NA                  Social Determinants of Health (SDOH) Interventions SDOH Screenings   Food Insecurity: No Food Insecurity (04/03/2023)  Housing: Low Risk  (04/03/2023)  Transportation Needs: No Transportation Needs (04/03/2023)  Utilities: Not At Risk (04/03/2023)  Alcohol Screen: Low Risk  (12/27/2021)  Depression (PHQ2-9): Low Risk  (04/02/2023)  Financial Resource Strain: Low Risk  (12/27/2021)  Physical Activity: Insufficiently Active (12/27/2021)  Social Connections: Moderately Integrated (12/27/2021)  Stress: No Stress Concern Present (12/27/2021)  Tobacco Use: Low Risk  (04/02/2023)     Readmission Risk Interventions    04/04/2023   11:25 AM  Readmission Risk Prevention Plan  Transportation Screening Complete  PCP or Specialist Appt within 5-7 Days Complete  Home Care Screening Complete  Medication Review (RN CM) Complete

## 2023-04-04 NOTE — [place] FL2 (Signed)
[place] MEDICAID FL2 LEVEL OF CARE FORM     IDENTIFICATION  Patient Name: [name] Birthdate: May 03, 1923 Sex: female Admission Date (Current Location): 04/02/2023  [facility] and [place] Number:  Producer, television/film/video and Address:  [facility],  [address] [place], [place] [address]      Provider Number: [id number]  Attending Physician Name and Address:  Briant Cedar, MD  Relative Name and Phone Number:  Milta Deiters [phone]    Current Level of Care: Hospital Recommended Level of Care: Nursing Facility Prior Approval Number:    Date Approved/Denied:   PASRR Number:    Discharge Plan: SNF    Current Diagnoses: Patient Active Problem List   Diagnosis Date Noted   Hematochezia 04/03/2023   AAA (abdominal aortic aneurysm) (HCC) 04/03/2023   CKD stage 3b, GFR 30-44 ml/min (HCC) 04/03/2023   Acute lower GI bleeding 04/02/2023   Primary osteoarthritis of left knee 08/14/2022   Urinary incontinence 08/14/2022   Acute on chronic diastolic HF (heart failure) (HCC)    Persistent atrial fibrillation (HCC)    ICH (intracerebral hemorrhage) (HCC) 03/25/2022   Hypertensive emergency    Osteoporosis 10/24/2021   Chronic anticoagulation 01/17/2021   Parastomal hernia s/p colostomy takedown & repair 01/17/2021 01/17/2021   Polymyalgia rheumatica (HCC) 01/11/2021   (HFpEF) heart failure with preserved ejection fraction (HCC) 10/27/2020   Injury of extensor or abductor muscle, fascia, or tendon of thumb at forearm level 08/03/2020   Orthostatic hypotension 05/24/2020   Acute blood loss anemia 05/19/2020   Traumatic hematoma of left hip 05/19/2020   Current chronic use of systemic steroids 05/19/2020   Advanced age 89/12/2019   Secondary hypercoagulable state (HCC) 03/08/2020   Hypokalemia 02/15/2020   History of diverticulitis of colon with perforation 01/26/2020   Anemia    Debility 01/14/2020   Physical debility 01/14/2020   Chronic atrial  fibrillation (HCC)    Recurrent falls 12/29/2019   Positive ANA (antinuclear antibody) 12/29/2019   Osteoarthritis 11/13/2017   Hypertriglyceridemia 01/29/2017   Excessive gas 01/09/2017   Physical exam 07/31/2016   Elevated lipase 03/06/2016   Chest pain 03/06/2016   Hypothyroidism 01/26/2016   HTN (hypertension) 01/26/2016   Left leg numbness 10/06/2012    Orientation RESPIRATION BLADDER Height & Weight     Self, Time, Situation, Place  Normal Incontinent Weight: 135 lb (61.2 kg) Height:  5\' 6"  (167.6 cm)  BEHAVIORAL SYMPTOMS/MOOD NEUROLOGICAL BOWEL NUTRITION STATUS      Incontinent Diet (See discharge summary)  AMBULATORY STATUS COMMUNICATION OF NEEDS Skin   Extensive Assist Verbally Normal                       Personal Care Assistance Level of Assistance  Bathing, Feeding, Dressing Bathing Assistance: Limited assistance Feeding assistance: Limited assistance Dressing Assistance: Limited assistance     Functional Limitations Info  Sight, Hearing, Speech Sight Info: Impaired Hearing Info: Adequate Speech Info: Adequate    SPECIAL CARE FACTORS FREQUENCY  PT (By licensed PT), OT (By licensed OT)     PT Frequency: 5x/wk OT Frequency: 5x/wk            Contractures Contractures Info: Not present    Additional Factors Info  Code Status, Allergies Code Status Info: FULL Allergies Info: Keflex (Cephalexin), Codeine           Current Medications (04/04/2023):  This is the current hospital active medication list Current Facility-Administered Medications  Medication Dose Route Frequency Provider  Last Rate Last Admin   acetaminophen (TYLENOL) tablet 650 mg  650 mg Oral Q6H PRN John Giovanni, MD   650 mg at 04/03/23 1035   Or   acetaminophen (TYLENOL) suppository 650 mg  650 mg Rectal Q6H PRN John Giovanni, MD       brimonidine (ALPHAGAN) 0.2 % ophthalmic solution 1 drop  1 drop Right Eye BID Briant Cedar, MD   1 drop at 04/04/23 0933    diltiazem (CARDIZEM CD) 24 hr capsule 120 mg  120 mg Oral QPM Briant Cedar, MD   120 mg at 04/03/23 1855   dorzolamide-timolol (COSOPT) 2-0.5 % ophthalmic solution 1 drop  1 drop Right Eye BID Briant Cedar, MD   1 drop at 04/04/23 0931   gabapentin (NEURONTIN) capsule 200 mg  200 mg Oral QHS Briant Cedar, MD   200 mg at 04/03/23 2205   hydrocortisone (ANUSOL-HC) suppository 25 mg  25 mg Rectal QHS Nandigam, Kavitha V, MD       levothyroxine (SYNTHROID) tablet 50 mcg  50 mcg Oral Q0600 Briant Cedar, MD   50 mcg at 04/04/23 1610   losartan (COZAAR) tablet 50 mg  50 mg Oral Daily Briant Cedar, MD   50 mg at 04/04/23 9604   melatonin tablet 6 mg  6 mg Oral QHS Briant Cedar, MD   6 mg at 04/03/23 2205   metoprolol tartrate (LOPRESSOR) injection 2.5 mg  2.5 mg Intravenous Q8H PRN Briant Cedar, MD   2.5 mg at 04/03/23 1037   multivitamin (PROSIGHT) tablet 1 tablet  1 tablet Oral Q lunch Briant Cedar, MD       nadolol (CORGARD) tablet 80 mg  80 mg Oral Daily Briant Cedar, MD   80 mg at 04/04/23 0928   ondansetron (ZOFRAN) tablet 4 mg  4 mg Oral Q6H PRN Briant Cedar, MD       pantoprazole (PROTONIX) EC tablet 40 mg  40 mg Oral Daily Briant Cedar, MD   40 mg at 04/04/23 5409   polyvinyl alcohol (LIQUIFILM TEARS) 1.4 % ophthalmic solution 1 drop  1 drop Left Eye QID Briant Cedar, MD   1 drop at 04/04/23 0932   trimethoprim (TRIMPEX) tablet 100 mg  100 mg Oral Daily Briant Cedar, MD   100 mg at 04/04/23 8119   witch hazel-glycerin (TUCKS) pad 1 Application  1 Application Topical PRN Briant Cedar, MD         Discharge Medications: Please see discharge summary for a list of discharge medications.  Relevant Imaging Results:  Relevant Lab Results:   Additional Information SSN: [id number]  Otelia Santee, LCSW

## 2023-04-04 NOTE — Progress Notes (Signed)
Progress Note  Primary GI: [facility] PCP  LOS: 1 day   Chief Complaint: Hematochezia   Subjective   Patient has had no further hematochezia since prior to hospitalization.  Hemoglobin stable.  Patient is ready to go home today   Objective   Vital signs in last 24 hours: Temp:  [97.5 F (36.4 C)-98.2 F (36.8 C)] 97.5 F (36.4 C) (05/24 0458) Pulse Rate:  [58-86] 65 (05/24 0458) Resp:  [15-21] 16 (05/24 0458) BP: (140-191)/(69-105) 165/69 (05/24 0458) SpO2:  [96 %-99 %] 98 % (05/24 0458) Last BM Date : 04/02/23 Last BM recorded by nurses in past 5 days No data recorded  General:   female in no acute distress  Heart:  Regular rate and rhythm; no murmurs Pulm: Clear anteriorly; no wheezing Abdomen: soft, nondistended, normal bowel sounds in all quadrants. Nontender without guarding. No organomegaly appreciated. Extremities:  No edema Neurologic:  Alert and  oriented x4;  No focal deficits.  Psych:  Cooperative. Normal mood and affect.  Intake/Output from previous day: No intake/output data recorded. Intake/Output this shift: Total I/O In: 240 [P.O.:240] Out: 500 [Urine:500]  Studies/Results: CT ABDOMEN PELVIS WO CONTRAST  Result Date: 04/02/2023 CLINICAL DATA:  Lower GI bleed. EXAM: CT ABDOMEN AND PELVIS WITHOUT CONTRAST TECHNIQUE: Multidetector CT imaging of the abdomen and pelvis was performed following the standard protocol without IV contrast. RADIATION DOSE REDUCTION: This exam was performed according to the departmental dose-optimization program which includes automated exposure control, adjustment of the mA and/or kV according to patient size and/or use of iterative reconstruction technique. COMPARISON:  CT abdomen pelvis dated 07/17/2021. FINDINGS: Evaluation of this exam is limited in the absence of intravenous contrast. Lower chest: Left lung base linear atelectasis/scarring. The visualized lung bases are otherwise clear. There is coronary vascular calcification and  calcification of the mitral annulus. No intra-abdominal free air or free fluid. Hepatobiliary: Similar appearance of a fluid density which shaped area in the right lobe of the liver. The liver is otherwise unremarkable. No calcified gallstone or pericholecystic fluid. Pancreas: No active inflammatory changes. Spleen: Normal in size without focal abnormality. Adrenals/Urinary Tract: The adrenal glands unremarkable. Stable 3 cm right renal upper pole cyst. No imaging follow-up. There is no hydronephrosis or nephrolithiasis on either side. The visualized ureters and urinary bladder appear unremarkable. Stomach/Bowel: Postsurgical changes of partial sigmoid resection. There is moderate stool throughout the colon. There is scattered colonic diverticulosis without active inflammatory changes. There is no bowel obstruction or active inflammation. Appendectomy. Vascular/Lymphatic: Advanced aortoiliac atherosclerotic disease. The aorta is tortuous. There is a 3.2 cm infrarenal abdominal aortic aneurysm. The IVC is unremarkable. No portal venous gas. There is no adenopathy. Reproductive: Hysterectomy.  No adnexal masses. Other: Midline vertical anterior pelvic wall incisional scar. Musculoskeletal: Total left hip arthroplasty. Osteopenia with degenerative changes of the spine. Old T12 compression fracture with anterior wedging and vertebroplasty. No acute osseous pathology. IMPRESSION: 1. No acute intra-abdominal or pelvic pathology. 2. Colonic diverticulosis. 3. Constipation.  No bowel obstruction. 4. A 3.2 cm infrarenal abdominal aortic aneurysm. Recommend follow-up ultrasound every 3 years. 5.  Aortic Atherosclerosis (ICD10-I70.0). Electronically Signed   By: Elgie Collard M.D.   On: 04/02/2023 22:43    Lab Results: Recent Labs    04/02/23 2050 04/02/23 2120 04/03/23 0600 04/04/23 0534  WBC 8.5  --  9.4 9.3  HGB 11.4* 12.2 11.1* 11.5*  HCT 37.0 36.0 35.3* 36.8  PLT 272  --  257 253   BMET Recent Labs  04/02/23 2050 04/02/23 2120 04/03/23 0600 04/04/23 0534  NA 140 141 142 139  K 4.0 4.1 4.0 4.1  CL 110 110 115* 111  CO2 21*  --  20* 22  GLUCOSE 100* 95 95 99  BUN 26* 24* 19 17  CREATININE 1.50* 1.60* 1.37* 1.19*  CALCIUM 8.7*  --  8.5* 8.9   LFT Recent Labs    04/02/23 2050  PROT 6.4*  ALBUMIN 3.5  AST 16  ALT 12  ALKPHOS 104  BILITOT 0.7   PT/INR No results for input(s): "LABPROT", "INR" in the last 72 hours.   Scheduled Meds:  brimonidine  1 drop Right Eye BID   diltiazem  120 mg Oral QPM   dorzolamide-timolol  1 drop Right Eye BID   gabapentin  200 mg Oral QHS   hydrocortisone  25 mg Rectal QHS   levothyroxine  50 mcg Oral Q0600   losartan  50 mg Oral Daily   melatonin  6 mg Oral QHS   multivitamin  1 tablet Oral Q lunch   nadolol  80 mg Oral Daily   pantoprazole  40 mg Oral Daily   polyvinyl alcohol  1 drop Left Eye QID   trimethoprim  100 mg Oral Daily   Continuous Infusions:   Impression:   Hematochezia -CT shows diverticulosis and constipation -Hgb 11.5 -BUN 17, creatinine 1.19 Suspect diverticular bleed versus bleeding from internal hemorrhoids.  No further bleeding at this time.  Hemoglobin stable  Persistent A-fib CKD stage IIIb   Plan:   -No further bleeding, hemoglobin stable, patient can be discharged from GI standpoint -Continue MiraLAX daily to improve constipation as an outpatient   Carmelita Amparo M Daundre Biel  04/04/2023, 9:09 AM

## 2023-04-04 NOTE — Discharge Summary (Signed)
Physician Discharge Summary   Patient: [name] MRN: [id number] DOB: 1923/08/15  Admit date:     04/02/2023  Discharge date: 04/04/23  Discharge Physician: Briant Cedar   PCP: Eloisa Northern, MD   Recommendations at discharge:    Follow up with PCP in 1 week  Discharge Diagnoses: Principal Problem:   Acute lower GI bleeding Active Problems:   Hypothyroidism   HTN (hypertension)   Persistent atrial fibrillation (HCC)   Hematochezia   AAA (abdominal aortic aneurysm) (HCC)   CKD stage 3b, GFR 30-44 ml/min [facility])    Hospital Course: ADRAINE ISAIS is a 89 y.o. female with medical history significant of persistent A-fib no longer on anticoagulation due to history of left thalamic intracerebral hemorrhage in May 2023 with residual right leg weakness and gait/balance difficulties, mild cognitive impairment, hypertension, chronic HFpEF, anemia, orthostatic hypotension, history of DVT, GERD, hypothyroidism, melanoma, PVD, CKD stage IIIb.  Patient is followed by neurology and currently on aspirin for stroke prevention.    Patient presents to the ED complaining of acute onset rectal bleeding/bright red blood per rectum and dizziness. In the ED, FOBT positive. CT abdomen/pelvis negative for acute finding but showing colonic diverticulosis and constipation without bowel obstruction. She reports history of colonoscopy over 10 years ago.  Patient admitted for further management.     Today, pt denies any new complaints, denies any further bleeding, abdominal pain, N/V. Pt very eager to be discharged. Sons at bedside   Assessment and Plan:  Acute lower GI bleed/hematochezia Possibly diverticular bleeding Reported 1 episode of large-volume bright red blood per rectum prior to arrival None so far, remains hemodynamically stable, FOBT positive Hemoglobin is stable GI consulted, appreciate recs Continue daily miralax Follow up with PCP   Persistent A-fib No longer on anticoagulation  due to history of ICH in May 2023 Currently rate controlled Continue diltiazem   Hypertension BP stable Continue losartan, nadolol   CKD stage IIIb Renal function stable, continue to monitor labs.   Incidental AAA CT showing a 3.2 cm infrarenal abdominal aortic aneurysm for which radiologist is recommending follow-up ultrasound every 3 years.    History of CVA and PVD Continue aspirin History of statin intolerance   Chronic HFpEF BNP 386 Stable, no signs of volume overload   GERD Pantoprazole   Hypothyroidism Continue brand name Synthroid      Consultants: GI Procedures performed: None Disposition: Nursing home Diet recommendation:  Cardiac diet  DISCHARGE MEDICATION: Allergies as of 04/04/2023       Reactions   Keflex [cephalexin] Nausea Only, Other (See Comments)   Pt ended up in ER w/ CHEST PAIN.  Patient experienced chest pain again with re-challenge of cephalexin in May 2023.   Codeine Other (See Comments)   "Nightmares, imagined things"        Medication List     TAKE these medications    acetaminophen 325 MG tablet Commonly known as: TYLENOL Take 650 mg by mouth every 6 (six) hours as needed for mild pain or moderate pain.   aspirin EC 81 MG tablet Take 1 tablet (81 mg total) by mouth daily. Swallow whole.   Biofreeze 4 % Gel Generic drug: Menthol (Topical Analgesic) Apply 1 Application topically daily as needed (pain).   brimonidine 0.2 % ophthalmic solution Commonly known as: ALPHAGAN Place 1 drop into the right eye 2 (two) times daily.   diltiazem 120 MG 24 hr capsule Commonly known as: CARDIZEM CD Take 1 capsule (120 mg total)  by mouth every evening.   DORZOLAMIDE HCL-TIMOLOL MAL OP Place 1 drop into the right eye in the morning and at bedtime. Cosopt 22.3 mg/6.8 mg/ml   gabapentin 100 MG capsule Commonly known as: NEURONTIN Take 2 capsules (200 mg total) by mouth at bedtime.   hydrocortisone 25 MG suppository Commonly known  as: ANUSOL-HC Place 1 suppository (25 mg total) rectally at bedtime.   hydrocortisone cream 1 % Apply 1 Application topically 3 (three) times daily as needed for itching.   hydroxypropyl methylcellulose / hypromellose 2.5 % ophthalmic solution Commonly known as: ISOPTO TEARS / GONIOVISC Place 1 drop into the left eye 4 (four) times daily.   levothyroxine 50 MCG tablet Commonly known as: SYNTHROID Take 1 tablet (50 mcg total) by mouth daily before breakfast.   loperamide 2 MG tablet Commonly known as: IMODIUM A-D Take 2 mg by mouth 4 (four) times daily as needed for diarrhea or loose stools.   losartan 50 MG tablet Commonly known as: COZAAR Take 50 mg by mouth daily.   melatonin 3 MG Tabs tablet Take 6 mg by mouth at bedtime.   MIRALAX PO Take 1 packet by mouth daily.   nadolol 80 MG tablet Commonly known as: CORGARD Take 1 tablet (80 mg total) by mouth daily.   nitroGLYCERIN 0.4 MG SL tablet Commonly known as: NITROSTAT Place 1 tablet (0.4 mg total) under the tongue every 5 (five) minutes as needed for chest pain.   ondansetron 4 MG tablet Commonly known as: ZOFRAN Take 4 mg by mouth every 6 (six) hours as needed for nausea or vomiting.   pantoprazole 40 MG tablet Commonly known as: PROTONIX Take 1 tablet (40 mg total) by mouth daily.   polyvinyl alcohol 1.4 % ophthalmic solution Commonly known as: LIQUIFILM TEARS Place 1 drop into the right eye as needed for dry eyes.   predniSONE 1 MG tablet Commonly known as: DELTASONE Take 2 tablets (2 mg total) by mouth daily with lunch.   PRESERVISION AREDS 2 PO Take 1 tablet by mouth daily with lunch.   PROSTAT PO Take 30 mLs by mouth daily.   trimethoprim 100 MG tablet Commonly known as: TRIMPEX Take 100 mg by mouth in the morning.   witch hazel-glycerin pad Commonly known as: TUCKS Apply 1 Application topically as needed for itching.        Follow-up Information     Eloisa Northern, MD. Schedule an  appointment as soon as possible for a visit in 1 week(s).   Specialty: Internal Medicine Contact information: [address] [place] [address] [phone]                Discharge Exam: Ceasar Mons Weights   04/02/23 2041  Weight: 61.2 kg   General: NAD  Cardiovascular: S1, S2 present Respiratory: CTAB Abdomen: Soft, nontender, nondistended, bowel sounds present Musculoskeletal: No bilateral pedal edema noted Skin: Normal Psychiatry: Normal mood   Condition at discharge: stable  The results of significant diagnostics from this hospitalization (including imaging, microbiology, ancillary and laboratory) are listed below for reference.   Imaging Studies: CT ABDOMEN PELVIS WO CONTRAST  Result Date: 04/02/2023 CLINICAL DATA:  Lower GI bleed. EXAM: CT ABDOMEN AND PELVIS WITHOUT CONTRAST TECHNIQUE: Multidetector CT imaging of the abdomen and pelvis was performed following the standard protocol without IV contrast. RADIATION DOSE REDUCTION: This exam was performed according to the departmental dose-optimization program which includes automated exposure control, adjustment of the mA and/or kV according to patient size and/or  use of iterative reconstruction technique. COMPARISON:  CT abdomen pelvis dated 07/17/2021. FINDINGS: Evaluation of this exam is limited in the absence of intravenous contrast. Lower chest: Left lung base linear atelectasis/scarring. The visualized lung bases are otherwise clear. There is coronary vascular calcification and calcification of the mitral annulus. No intra-abdominal free air or free fluid. Hepatobiliary: Similar appearance of a fluid density which shaped area in the right lobe of the liver. The liver is otherwise unremarkable. No calcified gallstone or pericholecystic fluid. Pancreas: No active inflammatory changes. Spleen: Normal in size without focal abnormality. Adrenals/Urinary Tract: The adrenal glands unremarkable. Stable 3 cm right renal upper pole  cyst. No imaging follow-up. There is no hydronephrosis or nephrolithiasis on either side. The visualized ureters and urinary bladder appear unremarkable. Stomach/Bowel: Postsurgical changes of partial sigmoid resection. There is moderate stool throughout the colon. There is scattered colonic diverticulosis without active inflammatory changes. There is no bowel obstruction or active inflammation. Appendectomy. Vascular/Lymphatic: Advanced aortoiliac atherosclerotic disease. The aorta is tortuous. There is a 3.2 cm infrarenal abdominal aortic aneurysm. The IVC is unremarkable. No portal venous gas. There is no adenopathy. Reproductive: Hysterectomy.  No adnexal masses. Other: Midline vertical anterior pelvic wall incisional scar. Musculoskeletal: Total left hip arthroplasty. Osteopenia with degenerative changes of the spine. Old T12 compression fracture with anterior wedging and vertebroplasty. No acute osseous pathology. IMPRESSION: 1. No acute intra-abdominal or pelvic pathology. 2. Colonic diverticulosis. 3. Constipation.  No bowel obstruction. 4. A 3.2 cm infrarenal abdominal aortic aneurysm. Recommend follow-up ultrasound every 3 years. 5.  Aortic Atherosclerosis (ICD10-I70.0). Electronically Signed   By: Elgie Collard M.D.   On: 04/02/2023 22:43    Microbiology: Results for orders placed or performed during the hospital encounter of 04/02/22  Urine Culture     Status: None   Collection Time: 04/04/22 11:43 AM   Specimen: Urine, Catheterized  Result Value Ref Range Status   Specimen Description URINE, CATHETERIZED  Final   Special Requests NONE  Final   Culture   Final    NO GROWTH Performed at [facility], [address], [place], [place] [address]    Report Status 04/05/2022 FINAL  Final  Urine Culture     Status: Abnormal   Collection Time: 04/18/22  9:23 AM   Specimen: Urine, Clean Catch  Result Value Ref Range Status   Specimen Description URINE, CLEAN CATCH  Final   Special  Requests   Final    NONE Performed at [facility], [address], [place], [place] [address]    Culture >=100,000 COLONIES/mL CITROBACTER KOSERI (A)  Final   Report Status 04/21/2022 FINAL  Final   Organism ID, Bacteria CITROBACTER KOSERI (A)  Final      Susceptibility   Citrobacter koseri - MIC*    CEFAZOLIN <=4 SENSITIVE Sensitive     CEFEPIME <=0.12 SENSITIVE Sensitive     CEFTRIAXONE <=0.25 SENSITIVE Sensitive     CIPROFLOXACIN <=0.25 SENSITIVE Sensitive     GENTAMICIN <=1 SENSITIVE Sensitive     IMIPENEM <=0.25 SENSITIVE Sensitive     NITROFURANTOIN 32 SENSITIVE Sensitive     TRIMETH/SULFA <=20 SENSITIVE Sensitive     PIP/TAZO <=4 SENSITIVE Sensitive     * >=100,000 COLONIES/mL CITROBACTER KOSERI    Labs: CBC: Recent Labs  Lab 04/02/23 2050 04/02/23 2120 04/03/23 0600 04/04/23 0534  WBC 8.5  --  9.4 9.3  NEUTROABS 4.3  --   --  5.1  HGB 11.4* 12.2 11.1* 11.5*  HCT 37.0 36.0 35.3* 36.8  MCV 91.4  --  90.5 90.0  PLT 272  --  257 253   Basic Metabolic Panel: Recent Labs  Lab 04/02/23 2050 04/02/23 2120 04/03/23 0600 04/04/23 0534  NA 140 141 142 139  K 4.0 4.1 4.0 4.1  CL 110 110 115* 111  CO2 21*  --  20* 22  GLUCOSE 100* 95 95 99  BUN 26* 24* 19 17  CREATININE 1.50* 1.60* 1.37* 1.19*  CALCIUM 8.7*  --  8.5* 8.9   Liver Function Tests: Recent Labs  Lab 04/02/23 2050  AST 16  ALT 12  ALKPHOS 104  BILITOT 0.7  PROT 6.4*  ALBUMIN 3.5   CBG: No results for input(s): "GLUCAP" in the last 168 hours.  Discharge time spent: greater than 30 minutes.  Signed: Briant Cedar, MD [facility] 04/04/2023

## 2023-04-04 NOTE — Progress Notes (Signed)
Patient is returning to [facility], called couple of times to give report  was not success ,so left voice message.

## 2023-04-06 ENCOUNTER — Inpatient Hospital Stay (HOSPITAL_COMMUNITY)
Admission: EM | Admit: 2023-04-06 | Discharge: 2023-04-10 | DRG: 393 | Disposition: A | Discharge status: Other Acute Inpatient Hospital | Payer: Medicare PPO | Source: Skilled Nursing Facility | Attending: Family Medicine | Admitting: Family Medicine

## 2023-04-06 ENCOUNTER — Encounter (HOSPITAL_COMMUNITY): Payer: Self-pay | Admitting: Internal Medicine

## 2023-04-06 ENCOUNTER — Other Ambulatory Visit: Payer: Self-pay

## 2023-04-06 DIAGNOSIS — I739 Peripheral vascular disease, unspecified: Secondary | ICD-10-CM | POA: Diagnosis present

## 2023-04-06 DIAGNOSIS — G629 Polyneuropathy, unspecified: Secondary | ICD-10-CM | POA: Diagnosis present

## 2023-04-06 DIAGNOSIS — Z96642 Presence of left artificial hip joint: Secondary | ICD-10-CM | POA: Diagnosis present

## 2023-04-06 DIAGNOSIS — Z7952 Long term (current) use of systemic steroids: Secondary | ICD-10-CM

## 2023-04-06 DIAGNOSIS — K921 Melena: Secondary | ICD-10-CM | POA: Diagnosis not present

## 2023-04-06 DIAGNOSIS — D120 Benign neoplasm of cecum: Secondary | ICD-10-CM | POA: Diagnosis present

## 2023-04-06 DIAGNOSIS — Z79890 Hormone replacement therapy (postmenopausal): Secondary | ICD-10-CM | POA: Diagnosis not present

## 2023-04-06 DIAGNOSIS — I130 Hypertensive heart and chronic kidney disease with heart failure and stage 1 through stage 4 chronic kidney disease, or unspecified chronic kidney disease: Secondary | ICD-10-CM | POA: Diagnosis present

## 2023-04-06 DIAGNOSIS — Z86718 Personal history of other venous thrombosis and embolism: Secondary | ICD-10-CM

## 2023-04-06 DIAGNOSIS — Z90710 Acquired absence of both cervix and uterus: Secondary | ICD-10-CM

## 2023-04-06 DIAGNOSIS — I69398 Other sequelae of cerebral infarction: Secondary | ICD-10-CM

## 2023-04-06 DIAGNOSIS — E785 Hyperlipidemia, unspecified: Secondary | ICD-10-CM | POA: Diagnosis present

## 2023-04-06 DIAGNOSIS — Z79899 Other long term (current) drug therapy: Secondary | ICD-10-CM

## 2023-04-06 DIAGNOSIS — Z7982 Long term (current) use of aspirin: Secondary | ICD-10-CM

## 2023-04-06 DIAGNOSIS — N189 Chronic kidney disease, unspecified: Secondary | ICD-10-CM | POA: Diagnosis not present

## 2023-04-06 DIAGNOSIS — Z85820 Personal history of malignant melanoma of skin: Secondary | ICD-10-CM

## 2023-04-06 DIAGNOSIS — K640 First degree hemorrhoids: Secondary | ICD-10-CM | POA: Diagnosis present

## 2023-04-06 DIAGNOSIS — K644 Residual hemorrhoidal skin tags: Secondary | ICD-10-CM | POA: Diagnosis present

## 2023-04-06 DIAGNOSIS — I4821 Permanent atrial fibrillation: Secondary | ICD-10-CM | POA: Diagnosis present

## 2023-04-06 DIAGNOSIS — K219 Gastro-esophageal reflux disease without esophagitis: Secondary | ICD-10-CM | POA: Diagnosis present

## 2023-04-06 DIAGNOSIS — K5790 Diverticulosis of intestine, part unspecified, without perforation or abscess without bleeding: Secondary | ICD-10-CM | POA: Diagnosis not present

## 2023-04-06 DIAGNOSIS — K649 Unspecified hemorrhoids: Secondary | ICD-10-CM | POA: Diagnosis not present

## 2023-04-06 DIAGNOSIS — I5032 Chronic diastolic (congestive) heart failure: Secondary | ICD-10-CM | POA: Diagnosis present

## 2023-04-06 DIAGNOSIS — D123 Benign neoplasm of transverse colon: Secondary | ICD-10-CM | POA: Diagnosis present

## 2023-04-06 DIAGNOSIS — I7143 Infrarenal abdominal aortic aneurysm, without rupture: Secondary | ICD-10-CM | POA: Diagnosis present

## 2023-04-06 DIAGNOSIS — N179 Acute kidney failure, unspecified: Secondary | ICD-10-CM | POA: Diagnosis present

## 2023-04-06 DIAGNOSIS — D630 Anemia in neoplastic disease: Secondary | ICD-10-CM | POA: Diagnosis not present

## 2023-04-06 DIAGNOSIS — K922 Gastrointestinal hemorrhage, unspecified: Secondary | ICD-10-CM | POA: Diagnosis present

## 2023-04-06 DIAGNOSIS — E039 Hypothyroidism, unspecified: Secondary | ICD-10-CM | POA: Diagnosis present

## 2023-04-06 DIAGNOSIS — E860 Dehydration: Secondary | ICD-10-CM | POA: Diagnosis present

## 2023-04-06 DIAGNOSIS — N1832 Chronic kidney disease, stage 3b: Secondary | ICD-10-CM | POA: Diagnosis present

## 2023-04-06 DIAGNOSIS — K5900 Constipation, unspecified: Secondary | ICD-10-CM | POA: Diagnosis present

## 2023-04-06 DIAGNOSIS — K5731 Diverticulosis of large intestine without perforation or abscess with bleeding: Secondary | ICD-10-CM | POA: Diagnosis present

## 2023-04-06 DIAGNOSIS — Z825 Family history of asthma and other chronic lower respiratory diseases: Secondary | ICD-10-CM

## 2023-04-06 DIAGNOSIS — D649 Anemia, unspecified: Secondary | ICD-10-CM | POA: Diagnosis present

## 2023-04-06 DIAGNOSIS — H409 Unspecified glaucoma: Secondary | ICD-10-CM | POA: Diagnosis present

## 2023-04-06 DIAGNOSIS — I509 Heart failure, unspecified: Secondary | ICD-10-CM | POA: Diagnosis not present

## 2023-04-06 DIAGNOSIS — K625 Hemorrhage of anus and rectum: Secondary | ICD-10-CM | POA: Diagnosis present

## 2023-04-06 DIAGNOSIS — I4891 Unspecified atrial fibrillation: Secondary | ICD-10-CM | POA: Diagnosis not present

## 2023-04-06 DIAGNOSIS — R58 Hemorrhage, not elsewhere classified: Secondary | ICD-10-CM | POA: Diagnosis not present

## 2023-04-06 DIAGNOSIS — K5730 Diverticulosis of large intestine without perforation or abscess without bleeding: Secondary | ICD-10-CM | POA: Diagnosis not present

## 2023-04-06 DIAGNOSIS — Z7401 Bed confinement status: Secondary | ICD-10-CM | POA: Diagnosis not present

## 2023-04-06 DIAGNOSIS — D631 Anemia in chronic kidney disease: Secondary | ICD-10-CM | POA: Diagnosis not present

## 2023-04-06 LAB — COMPREHENSIVE METABOLIC PANEL
ALT: 11 U/L (ref 0–44)
AST: 13 U/L — ABNORMAL LOW (ref 15–41)
Albumin: 3.3 g/dL — ABNORMAL LOW (ref 3.5–5.0)
Alkaline Phosphatase: 96 U/L (ref 38–126)
Anion gap: 7 (ref 5–15)
BUN: 26 mg/dL — ABNORMAL HIGH (ref 8–23)
CO2: 23 mmol/L (ref 22–32)
Calcium: 9 mg/dL (ref 8.9–10.3)
Chloride: 109 mmol/L (ref 98–111)
Creatinine, Ser: 1.68 mg/dL — ABNORMAL HIGH (ref 0.44–1.00)
GFR, Estimated: 27 mL/min — ABNORMAL LOW (ref >60)
Glucose, Bld: 102 mg/dL — ABNORMAL HIGH (ref 70–99)
Potassium: 4.2 mmol/L (ref 3.5–5.1)
Sodium: 139 mmol/L (ref 135–145)
Total Bilirubin: 0.8 mg/dL (ref 0.3–1.2)
Total Protein: 6.3 g/dL — ABNORMAL LOW (ref 6.5–8.1)

## 2023-04-06 LAB — CBC WITH DIFFERENTIAL/PLATELET
Abs Immature Granulocytes: 0.02 10*3/uL (ref 0.00–0.07)
Basophils Absolute: 0 10*3/uL (ref 0.0–0.1)
Basophils Relative: 0 %
Eosinophils Absolute: 0.3 10*3/uL (ref 0.0–0.5)
Eosinophils Relative: 4 %
HCT: 35.6 % — ABNORMAL LOW (ref 36.0–46.0)
Hemoglobin: 11.3 g/dL — ABNORMAL LOW (ref 12.0–15.0)
Immature Granulocytes: 0 %
Lymphocytes Relative: 28 %
Lymphs Abs: 2.5 10*3/uL (ref 0.7–4.0)
MCH: 28.8 pg (ref 26.0–34.0)
MCHC: 31.7 g/dL (ref 30.0–36.0)
MCV: 90.6 fL (ref 80.0–100.0)
Monocytes Absolute: 0.9 10*3/uL (ref 0.1–1.0)
Monocytes Relative: 10 %
Neutro Abs: 5.1 10*3/uL (ref 1.7–7.7)
Neutrophils Relative %: 58 %
Platelets: 270 10*3/uL (ref 150–400)
RBC: 3.93 MIL/uL (ref 3.87–5.11)
RDW: 17 % — ABNORMAL HIGH (ref 11.5–15.5)
WBC: 8.8 10*3/uL (ref 4.0–10.5)
nRBC: 0 % (ref 0.0–0.2)

## 2023-04-06 LAB — PROTIME-INR
INR: 1 (ref 0.8–1.2)
Prothrombin Time: 13.8 seconds (ref 11.4–15.2)

## 2023-04-06 LAB — LIPASE, BLOOD: Lipase: 36 U/L (ref 11–51)

## 2023-04-06 LAB — TYPE AND SCREEN
ABO/RH(D): O POS
Antibody Screen: NEGATIVE

## 2023-04-06 LAB — POC OCCULT BLOOD, ED: Fecal Occult Bld: POSITIVE — AB

## 2023-04-06 MED ORDER — DORZOLAMIDE HCL-TIMOLOL MAL 2-0.5 % OP SOLN
1.0000 [drp] | Freq: Two times a day (BID) | OPHTHALMIC | Status: DC
  Administered 2023-04-06 – 2023-04-10 (×8): 1 [drp] via OPHTHALMIC
  Filled 2023-04-06: qty 10

## 2023-04-06 MED ORDER — TRAZODONE HCL 50 MG PO TABS: 25.0000 mg | ORAL_TABLET | Freq: Every evening | ORAL | Status: DC | PRN

## 2023-04-06 MED ORDER — GABAPENTIN 100 MG PO CAPS
200.0000 mg | ORAL_CAPSULE | Freq: Every day | ORAL | Status: DC
  Administered 2023-04-06 – 2023-04-09 (×4): 200 mg via ORAL
  Filled 2023-04-06 (×4): qty 2

## 2023-04-06 MED ORDER — TRIMETHOPRIM 100 MG PO TABS: 100.0000 mg | ORAL_TABLET | Freq: Every morning | ORAL | Status: DC

## 2023-04-06 MED ORDER — MELATONIN 3 MG PO TABS
6.0000 mg | ORAL_TABLET | Freq: Every day | ORAL | Status: DC
  Administered 2023-04-06 – 2023-04-09 (×4): 6 mg via ORAL
  Filled 2023-04-06 (×4): qty 2

## 2023-04-06 MED ORDER — POLYETHYLENE GLYCOL 3350 17 G PO PACK
17.0000 g | PACK | Freq: Every day | ORAL | Status: DC
  Administered 2023-04-06: 17 g via ORAL
  Filled 2023-04-06: qty 1

## 2023-04-06 MED ORDER — ONDANSETRON HCL 4 MG/2ML IJ SOLN: 4.0000 mg | Freq: Four times a day (QID) | INTRAMUSCULAR | Status: DC | PRN

## 2023-04-06 MED ORDER — ALBUTEROL SULFATE (2.5 MG/3ML) 0.083% IN NEBU: 2.5000 mg | INHALATION_SOLUTION | RESPIRATORY_TRACT | Status: DC | PRN

## 2023-04-06 MED ORDER — DILTIAZEM HCL ER COATED BEADS 120 MG PO CP24
120.0000 mg | ORAL_CAPSULE | Freq: Every evening | ORAL | Status: DC
  Administered 2023-04-06 – 2023-04-09 (×4): 120 mg via ORAL
  Filled 2023-04-06 (×4): qty 1

## 2023-04-06 MED ORDER — BRIMONIDINE TARTRATE 0.2 % OP SOLN
1.0000 [drp] | Freq: Two times a day (BID) | OPHTHALMIC | Status: DC
  Administered 2023-04-06 – 2023-04-10 (×8): 1 [drp] via OPHTHALMIC
  Filled 2023-04-06: qty 5

## 2023-04-06 MED ORDER — LEVOTHYROXINE SODIUM 50 MCG PO TABS
50.0000 ug | ORAL_TABLET | Freq: Every day | ORAL | Status: DC
  Administered 2023-04-10: 50 ug via ORAL
  Filled 2023-04-06 (×2): qty 1

## 2023-04-06 MED ORDER — ONDANSETRON HCL 4 MG PO TABS: 4.0000 mg | ORAL_TABLET | Freq: Four times a day (QID) | ORAL | Status: DC | PRN

## 2023-04-06 MED ORDER — SODIUM CHLORIDE 0.9 % IV BOLUS
500.0000 mL | Freq: Once | INTRAVENOUS | Status: AC
  Administered 2023-04-06: 500 mL via INTRAVENOUS

## 2023-04-06 MED ORDER — LOSARTAN POTASSIUM 25 MG PO TABS: 50.0000 mg | ORAL_TABLET | Freq: Every day | ORAL | Status: DC

## 2023-04-06 MED ORDER — SODIUM CHLORIDE 0.9 % IV SOLN: INTRAVENOUS | Status: DC

## 2023-04-06 MED ORDER — METOPROLOL TARTRATE 5 MG/5ML IV SOLN: 5.0000 mg | Freq: Four times a day (QID) | INTRAVENOUS | Status: DC | PRN

## 2023-04-06 MED ORDER — NADOLOL 20 MG PO TABS
80.0000 mg | ORAL_TABLET | Freq: Every day | ORAL | Status: DC
  Administered 2023-04-06 – 2023-04-10 (×5): 80 mg via ORAL
  Filled 2023-04-06 (×5): qty 4

## 2023-04-06 MED ORDER — ACETAMINOPHEN 325 MG PO TABS: 650.0000 mg | ORAL_TABLET | Freq: Four times a day (QID) | ORAL | Status: DC | PRN

## 2023-04-06 MED ORDER — PANTOPRAZOLE SODIUM 40 MG PO TBEC
40.0000 mg | DELAYED_RELEASE_TABLET | Freq: Every day | ORAL | Status: DC
  Administered 2023-04-06 – 2023-04-10 (×5): 40 mg via ORAL
  Filled 2023-04-06 (×5): qty 1

## 2023-04-06 MED ORDER — TRIMETHOPRIM 100 MG PO TABS
50.0000 mg | ORAL_TABLET | Freq: Every day | ORAL | Status: DC
  Administered 2023-04-07 – 2023-04-10 (×4): 50 mg via ORAL
  Filled 2023-04-06 (×4): qty 1

## 2023-04-06 MED ORDER — ACETAMINOPHEN 650 MG RE SUPP: 650.0000 mg | Freq: Four times a day (QID) | RECTAL | Status: DC | PRN

## 2023-04-06 NOTE — ED Triage Notes (Addendum)
Pt BIBA from [facility] with c/o GI bleed. Here for same 2 days ago. No resolution. Bright red blood noted when using restroom x3days. Denies pain. No blood thinners. Has not taken home meds today. Hx of Afib.   110/60 HR 72 98% RA  RR 18

## 2023-04-06 NOTE — H&P (Signed)
History and Physical  [name]:[id number] DOB: March 10, 1923 DOA: 04/06/2023  PCP: Eloisa Northern, MD   Chief Complaint: rectal bleeding   HPI: [name] is a 89 y.o. female with medical history significant for permanent A-fib, GERD, hypertension, hypothyroidism being admitted to the hospital with recurrent bright red blood per rectum.  She was admitted to this facility this past week and just discharged back to her nursing home 2 days ago for the same problem.  When she was admitted to the hospital on 5/23, aspirin was also seen in consultation by gastroenterology.   held in the hospital and she felt that her bleeding was likely hemorrhoidal or due to diverticular disease, but since her bleeding stopped and she was hemodynamically stable with stable hemoglobin, GI did not recommend any further workup.  She was discharged back to her nursing facility in stable condition and her aspirin was continued.  Patient states that since she got back home after leaving the hospital, every time she goes to use the bathroom which is 3 times a day, she has bright red blood per rectum.  It is painless.  She has no abdominal pain, nausea, fevers, chills.  Over the last 24 hours, she noticed that there are some portions of her stool that are very black along with the bright red blood which fills the toilet bowl.  ED Course: Evaluation here in the emergency department, she is hemodynamically stable in permanent A-fib with rates well-controlled.  Lab work is stable hemoglobin 11, creatinine up to 1.68 from her baseline 1.2.  Rectal exam reveals positive bright red blood.  ER provider discussed with gastroenterology, who will consult, and recommended hospitalist admission.  She was seen in the ER this afternoon with her son at the bedside.  Review of Systems: Please see HPI for pertinent positives and negatives. A complete 10 system review of systems are otherwise negative.  Past Medical History:  Diagnosis Date    Closed fracture of left distal radius 12/29/2019   DVT (deep venous thrombosis) (HCC) 09/2015   RLE   Dysrhythmia    Afib   GERD (gastroesophageal reflux disease)    Hip fracture (HCC) 12/02/2020   Hypertension    Hypothyroidism    Melanoma (HCC) 06/16/2017   Facial melanoma, removed by Dr. Alean Rinne   Peripheral vascular disease [place])    peripheral neuropathy   Past Surgical History:  Procedure Laterality Date   ABDOMINAL HYSTERECTOMY     APPENDECTOMY     BACK SURGERY     BREAST SURGERY     left breast biopsy   CARDIOVERSION N/A 05/09/2020   Procedure: CARDIOVERSION;  Surgeon: Chilton Si, MD;  Location: [facility] ENDOSCOPY;  Service: Cardiovascular;  Laterality: N/A;   COLOSTOMY N/A 01/06/2020   Procedure: Colostomy;  Surgeon: Gaynelle Adu, MD;  Location: [facility] OR;  Service: General;  Laterality: N/A;   EYE SURGERY     cataract   HARDWARE REMOVAL Left 01/01/2020   Procedure: HARDWARE REMOVAL;  Surgeon: Bradly Bienenstock, MD;  Location: [facility] OR;  Service: Orthopedics;  Laterality: Left;   HIP ARTHROPLASTY Left 12/03/2020   Procedure: ARTHROPLASTY BIPOLAR HIP (HEMIARTHROPLASTY);  Surgeon: Sheral Apley, MD;  Location: [facility] OR;  Service: Orthopedics;  Laterality: Left;   HIP ARTHROPLASTY Left    partial   IR KYPHO EA ADDL LEVEL THORACIC OR LUMBAR  06/27/2021   IR RADIOLOGIST EVAL & MGMT  07/13/2021   LAPAROTOMY N/A 01/06/2020   Procedure: Exploratory Laparotomy, Open Sigmoid colectomy;  Surgeon:  Gaynelle Adu, MD;  Location: [facility] OR;  Service: General;  Laterality: N/A;   LIPOMA EXCISION Left 04/25/2015   Procedure: EXCISION OF LIPOMA LEFT ARM;  Surgeon: Manus Rudd, MD;  Location: [facility];  Service: General;  Laterality: Left;   LUMBAR LAMINECTOMY/DECOMPRESSION MICRODISCECTOMY  11/20/2011   Procedure: LUMBAR LAMINECTOMY/DECOMPRESSION MICRODISCECTOMY;  Surgeon: Illene Labrador [place];  Location: [facility] ORS;  Service: Orthopedics;  Laterality: N/A;  Decompressive Laminectomy L2 to the Sacrum  (X-Ray)   LYSIS OF ADHESION N/A 01/17/2021   Procedure: RIGID PROCTOSCOPY;  Surgeon: Karie Soda, MD;  Location: [facility] ORS;  Service: General;  Laterality: N/A;   OPEN REDUCTION INTERNAL FIXATION (ORIF) DISTAL RADIAL FRACTURE Left 01/01/2020   Procedure: OPEN REDUCTION INTERNAL FIXATION (ORIF) DISTAL RADIAL FRACTURE;  Surgeon: Bradly Bienenstock, MD;  Location: [facility] OR;  Service: Orthopedics;  Laterality: Left;   OTHER SURGICAL HISTORY     left wrist surgery - has plate in left wrist    OTHER SURGICAL HISTORY     right knee surgery due to torn cartilage    TONSILLECTOMY     WRIST FRACTURE SURGERY Left    XI ROBOTIC ASSISTED COLOSTOMY TAKEDOWN N/A 01/17/2021   Procedure: XI ROBOTIC ASSISTED OSTOMY TAKEDOWN, LYSIS OF ADHESIONS, RECTOSIGMOID RESECTION, BILATERAL TAP BLOCK;  Surgeon: Karie Soda, MD;  Location: [facility] ORS;  Service: General;  Laterality: N/A;    Social History:  reports that she has never smoked. She has never used smokeless tobacco. She reports that she does not drink alcohol and does not use drugs.   Allergies  Allergen Reactions   Keflex [Cephalexin] Nausea Only and Other (See Comments)    Pt ended up in ER w/ CHEST PAIN.  Patient experienced chest pain again with re-challenge of cephalexin in May 2023.   Codeine Other (See Comments)    "Nightmares, imagined things"    Family History  Problem Relation Age of Onset   Cancer Mother        BREAST   COPD Father    Emphysema Father    Cancer Daughter        breast     Prior to Admission medications   Medication Sig Start Date End Date Taking? Authorizing Provider  acetaminophen (TYLENOL) 325 MG tablet Take 650 mg by mouth every 6 (six) hours as needed for mild pain or moderate pain.   Yes [provider]  aspirin EC 81 MG tablet Take 1 tablet (81 mg total) by mouth daily. Swallow whole. 07/18/22  Yes Micki Riley, MD  brimonidine (ALPHAGAN) 0.2 % ophthalmic solution Place 1 drop into the right eye 2 (two) times daily.    Yes [provider]  diltiazem (CARDIZEM CD) 120 MG 24 hr capsule Take 1 capsule (120 mg total) by mouth every evening. 04/30/22  Yes Setzer, Lynnell Jude, PA-C  DORZOLAMIDE HCL-TIMOLOL MAL OP Place 1 drop into the right eye in the morning and at bedtime. Cosopt 22.3 mg/6.8 mg/ml   Yes [provider]  gabapentin (NEURONTIN) 100 MG capsule Take 2 capsules (200 mg total) by mouth at bedtime. 04/30/22  Yes Setzer, Lynnell Jude, PA-C  hydrocortisone (ANUSOL-HC) 2.5 % rectal cream Place 1 Application rectally every 6 (six) hours as needed for hemorrhoids or anal itching.   Yes [provider]  hydrocortisone cream 1 % Apply 1 Application topically 3 (three) times daily as needed for itching.   Yes [provider]  hydroxypropyl methylcellulose / hypromellose (ISOPTO TEARS / GONIOVISC) 2.5 % ophthalmic solution  Place 1 drop into the left eye 4 (four) times daily.   Yes [provider]  levothyroxine (SYNTHROID) 50 MCG tablet Take 1 tablet (50 mcg total) by mouth daily before breakfast. 04/30/22  Yes Love, Evlyn Kanner, PA-C  loperamide (IMODIUM A-D) 2 MG tablet Take 2 mg by mouth 4 (four) times daily as needed for diarrhea or loose stools.   Yes [provider]  losartan (COZAAR) 50 MG tablet Take 50 mg by mouth daily.   Yes [provider]  melatonin 3 MG TABS tablet Take 6 mg by mouth at bedtime.   Yes [provider]  Menthol, Topical Analgesic, (BIOFREEZE) 4 % GEL Apply 1 Application topically daily as needed (pain).   Yes [provider]  Multiple Vitamins-Minerals (PRESERVISION AREDS 2 PO) Take 1 tablet by mouth daily with lunch.   Yes [provider]  nadolol (CORGARD) 80 MG tablet Take 1 tablet (80 mg total) by mouth daily. 05/01/22  Yes Setzer, Lynnell Jude, PA-C  nitroGLYCERIN (NITROSTAT) 0.4 MG SL tablet Place 1 tablet (0.4 mg total) under the tongue every 5 (five) minutes as needed for chest pain. 04/30/22  Yes Setzer, Lynnell Jude, PA-C  ondansetron (ZOFRAN) 4 MG tablet Take 4 mg by mouth every 6 (six) hours as needed for nausea or vomiting.   Yes [provider]  pantoprazole (PROTONIX) 40 MG tablet Take 1 tablet (40 mg total) by mouth daily. 04/30/22  Yes Setzer, Lynnell Jude, PA-C  Polyethylene Glycol 3350 (MIRALAX PO) Take 1 packet by mouth daily.   Yes [provider]  polyvinyl alcohol (LIQUIFILM TEARS) 1.4 % ophthalmic solution Place 1 drop into the right eye as needed for dry eyes. 04/30/22  Yes Setzer, Lynnell Jude, PA-C  predniSONE (DELTASONE) 1 MG tablet Take 2 tablets (2 mg total) by mouth daily with lunch. 04/30/22  Yes Setzer, Lynnell Jude, PA-C  trimethoprim (TRIMPEX) 100 MG tablet Take 100 mg by mouth in the morning.   Yes [provider]  witch hazel-glycerin (TUCKS) pad Apply 1 Application topically as needed for itching.   Yes [provider]  hydrocortisone (ANUSOL-HC) 25 MG suppository Place 1 suppository (25 mg total) rectally at bedtime. Patient not taking: Reported on 04/06/2023 04/04/23   Briant Cedar, MD  Pollen Extracts (PROSTAT PO) Take 30 mLs by mouth daily. Patient not taking: Reported on 04/06/2023    [provider]    Physical Exam: BP (!) 144/77   Pulse (!) 52   Temp 98 F (36.7 C) (Oral)   Resp 18   Ht 5\' 6"  (1.676 m)   Wt 62.1 kg   SpO2 98%   BMI 22.11 kg/m   General:  Alert, oriented, calm, in no acute distress, pleasant cooperative good historian, looks much younger than her stated age Eyes: EOMI, clear conjuctivae, white sclerea Neck: supple, no masses, trachea mildline  Cardiovascular: RRR, no murmurs or rubs, no peripheral edema  Respiratory: clear to auscultation bilaterally, no wheezes, no crackles  Abdomen: soft, nontender, nondistended, normal bowel tones heard  Skin: dry, no rashes  Musculoskeletal: no joint effusions, normal range of motion  Psychiatric: appropriate affect, normal speech  Neurologic: extraocular muscles  intact, clear speech, moving all extremities with intact sensorium          Labs on Admission:  Basic Metabolic Panel: Recent Labs  Lab 04/02/23 2050 04/02/23 2120 04/03/23 0600 04/04/23 0534 04/06/23 0941  NA 140 141 142 139 139  K 4.0 4.1 4.0 4.1  4.2  CL 110 110 115* 111 109  CO2 21*  --  20* 22 23  GLUCOSE 100* 95 95 99 102*  BUN 26* 24* 19 17 26*  CREATININE 1.50* 1.60* 1.37* 1.19* 1.68*  CALCIUM 8.7*  --  8.5* 8.9 9.0   Liver Function Tests: Recent Labs  Lab 04/02/23 2050 04/06/23 0941  AST 16 13*  ALT 12 11  ALKPHOS 104 96  BILITOT 0.7 0.8  PROT 6.4* 6.3*  ALBUMIN 3.5 3.3*   Recent Labs  Lab 04/06/23 0941  LIPASE 36   No results for input(s): "AMMONIA" in the last 168 hours. CBC: Recent Labs  Lab 04/02/23 2050 04/02/23 2120 04/03/23 0600 04/04/23 0534 04/06/23 0941  WBC 8.5  --  9.4 9.3 8.8  NEUTROABS 4.3  --   --  5.1 5.1  HGB 11.4* 12.2 11.1* 11.5* 11.3*  HCT 37.0 36.0 35.3* 36.8 35.6*  MCV 91.4  --  90.5 90.0 90.6  PLT 272  --  257 253 270   Cardiac Enzymes: No results for input(s): "CKTOTAL", "CKMB", "CKMBINDEX", "TROPONINI" in the last 168 hours.  BNP (last 3 results) Recent Labs    04/25/22 1446 04/03/23 0600  BNP 416.0* 386.1*    ProBNP (last 3 results) No results for input(s): "PROBNP" in the last 8760 hours.  CBG: No results for input(s): "GLUCAP" in the last 168 hours.  Radiological Exams on Admission: No results found.  Assessment/Plan This is a pleasant and mentally sharp 89 year old female with a history of hypertension, hyperlipidemia, hypothyroidism and recent onset of bright red blood per rectum of unclear etiology who returns to the ER today with complaints of continued bright red blood per rectum.  She is hemodynamically stable, with unchanged stable anemia.  Red blood per rectum-likely internal hemorrhoidal bleed, versus diverticular. -Observation admission -Keep n.p.o. for now until seen by GI -Hold  aspirin -Recheck hemoglobin in the morning -CT angio contraindicated due to AKI on CKD; perhaps could be scanned once renal function is improved  AKI on CKD-likely due to relative dehydration from blood loss -Avoid nephrotoxins -Hydrate with normal saline -Recheck renal function in the morning  Fibrillation-continue Cardizem  Neuropathy-continue Neurontin  Thyroidism-Home Synthroid  GERD-continue oral Protonix  UTI prophylaxis-continue renally dosed trimethoprim  DVT prophylaxis: SCDs     Code Status: Full Code confirmed with the patient and her son at the bedside at the time of admission  Consults called: EDP discussed with [facility] who will consult  Admission status: Observation   Time spent: 42 minutes  Zayvier Caravello Sharlette Dense MD [facility] Pager [phone]  If 7PM-7AM, please contact night-coverage [email or web address] Password [email or web address]  04/06/2023, 1:23 PM

## 2023-04-06 NOTE — ED Provider Notes (Signed)
Emergency Department Provider Note   I have reviewed the triage vital signs and the nursing notes.   HISTORY  Chief Complaint GI Bleeding   HPI [name] is a 89 y.o. female with past history reviewed below including recent admission for lower GI bleeding presents to the emergency department with return of bright red blood per rectum.  She has returned home to her nursing facility and states basically as soon as she got back she began having return of bright red blood per rectum.  She states when she is lying in bed she does not have any bleeding but when sitting on the commode she will have bright red blood that seems to fill the toilet bowl.  She is feeling some increasing weakness and fatigue.  No vomiting.  No abdominal or rectal pain.  She is not anticoagulated.    Past Medical History:  Diagnosis Date   Closed fracture of left distal radius 12/29/2019   DVT (deep venous thrombosis) (HCC) 09/2015   RLE   Dysrhythmia    Afib   GERD (gastroesophageal reflux disease)    Hip fracture (HCC) 12/02/2020   Hypertension    Hypothyroidism    Melanoma (HCC) 06/16/2017   Facial melanoma, removed by Dr. Alean Rinne   Peripheral vascular disease [facility])    peripheral neuropathy    Review of Systems  Constitutional: No fever/chills Cardiovascular: Denies chest pain. Respiratory: Denies shortness of breath. Gastrointestinal: No abdominal pain.  No nausea, no vomiting.  No diarrhea.  No constipation. Positive BRBPR.  Genitourinary: Negative for dysuria. Musculoskeletal: Negative for back pain. Skin: Negative for rash. Neurological: Negative for headaches, focal weakness or numbness.   ____________________________________________   PHYSICAL EXAM:  VITAL SIGNS: ED Triage Vitals [04/06/23 0914]  Enc Vitals Group     BP 137/63     Pulse Rate (!) 52     Resp 15     Temp 98 F (36.7 C)     Temp Source Oral     SpO2 100 %   Constitutional: Alert and oriented. Well appearing  and in no acute distress. Eyes: Conjunctivae are normal.  Head: Atraumatic. Nose: No congestion/rhinnorhea. Mouth/Throat: Mucous membranes are moist.  Neck: No stridor.   Cardiovascular: Normal rate, regular rhythm. Good peripheral circulation. Grossly normal heart sounds.   Respiratory: Normal respiratory effort.  No retractions. Lungs CTAB. Gastrointestinal: Soft and nontender. No distention. Rectal exam performed with patient's verbal consent and nurse chaperone. No thrombosed hemorrhoids or fissures. Scant BRB on the exam finger. No melena.  Musculoskeletal: No lower extremity tenderness nor edema. No gross deformities of extremities. Neurologic:  Normal speech and language. No gross focal neurologic deficits are appreciated.  Skin:  Skin is warm, dry and intact. No rash noted.  ____________________________________________   LABS (all labs ordered are listed, but only abnormal results are displayed)  Labs Reviewed  COMPREHENSIVE METABOLIC PANEL - Abnormal; Notable for the following components:      Result Value   Glucose, Bld 102 (*)    BUN 26 (*)    Creatinine, Ser 1.68 (*)    Total Protein 6.3 (*)    Albumin 3.3 (*)    AST 13 (*)    GFR, Estimated 27 (*)    All other components within normal limits  CBC WITH DIFFERENTIAL/PLATELET - Abnormal; Notable for the following components:   Hemoglobin 11.3 (*)    HCT 35.6 (*)    RDW 17.0 (*)    All other components within normal  limits  POC OCCULT BLOOD, ED - Abnormal; Notable for the following components:   Fecal Occult Bld POSITIVE (*)    All other components within normal limits  PROTIME-INR  LIPASE, BLOOD  TYPE AND SCREEN   ____________________________________________   PROCEDURES  Procedure(s) performed:   Procedures  None  ____________________________________________   INITIAL IMPRESSION / ASSESSMENT AND PLAN / ED COURSE  Pertinent labs & imaging results that were available during my care of the patient were  reviewed by me and considered in my medical decision making (see chart for details).   This patient is Presenting for Evaluation of GI bleeding, which does require a range of treatment options, and is a complaint that involves a high risk of morbidity and mortality.  The Differential Diagnoses diverticular bleed, internal hemorrhoids, colitis, PUD, AVM bleeding, etc.  Critical Interventions-    Medications  diltiazem (CARDIZEM CD) 24 hr capsule 120 mg (has no administration in time range)  nadolol (CORGARD) tablet 80 mg (has no administration in time range)  levothyroxine (SYNTHROID) tablet 50 mcg (has no administration in time range)  pantoprazole (PROTONIX) EC tablet 40 mg (has no administration in time range)  polyethylene glycol (MIRALAX / GLYCOLAX) packet 17 g (has no administration in time range)  melatonin tablet 6 mg (has no administration in time range)  gabapentin (NEURONTIN) capsule 200 mg (has no administration in time range)  brimonidine (ALPHAGAN) 0.2 % ophthalmic solution 1 drop (has no administration in time range)  dorzolamide-timolol (COSOPT) 2-0.5 % ophthalmic solution 1 drop (has no administration in time range)  acetaminophen (TYLENOL) tablet 650 mg (has no administration in time range)    Or  acetaminophen (TYLENOL) suppository 650 mg (has no administration in time range)  traZODone (DESYREL) tablet 25 mg (has no administration in time range)  ondansetron (ZOFRAN) tablet 4 mg (has no administration in time range)    Or  ondansetron (ZOFRAN) injection 4 mg (has no administration in time range)  albuterol (PROVENTIL) (2.5 MG/3ML) 0.083% nebulizer solution 2.5 mg (has no administration in time range)  metoprolol tartrate (LOPRESSOR) injection 5 mg (has no administration in time range)  trimethoprim (TRIMPEX) tablet 50 mg (has no administration in time range)  0.9 %  sodium chloride infusion (has no administration in time range)  sodium chloride 0.9 % bolus 500 mL (0  mLs Intravenous Stopped 04/06/23 1234)    Reassessment after intervention:  patient continues to have BRBPR in the bathroom here in the ED.   I decided to review pertinent External Data, and in summary patient discharged on 04/04/23.    Clinical Laboratory Tests Ordered, included FOBT positive. Anemia but not significant change with Hb of 11.3. AKI noted with creatinine of 1.68. Hold on CTA in this setting.   Radiologic Tests: CTA bleed scan ordered but with AKI plan to hold on this study for now.   Cardiac Monitor Tracing which shows NSR.    Social Determinants of Health Risk no smoking history.   Consult complete with [facility]. They will consult and advise further.   TRH. Plan for admit.   Medical Decision Making: Summary:  Patient turns to the emergency department with GI bleeding since discharge.  Plan for CT angio to evaluate for diverticular bleed that might be amenable to IR evaluation.  No heavy hemorrhage at this time.  Vital signs within normal limits.  Internal hemorrhoids also on the differential.  Patient followed by [facility] during her recent admission.   Reevaluation with update and discussion  with patient and son at bedside. Plan for obs admit and GI consultation.    Patient's presentation is most consistent with acute presentation with potential threat to life or bodily function.   Disposition: admit  ____________________________________________  FINAL CLINICAL IMPRESSION(S) / ED DIAGNOSES  Final diagnoses:  Gastrointestinal hemorrhage, unspecified gastrointestinal hemorrhage type    Note:  This document was prepared using Dragon voice recognition software and may include unintentional dictation errors.  Alona Bene, MD, [facility] Emergency Medicine    Dushaun Okey, Arlyss Repress, MD 04/06/23 [phone]

## 2023-04-07 DIAGNOSIS — E039 Hypothyroidism, unspecified: Secondary | ICD-10-CM | POA: Diagnosis present

## 2023-04-07 DIAGNOSIS — K625 Hemorrhage of anus and rectum: Secondary | ICD-10-CM

## 2023-04-07 DIAGNOSIS — D649 Anemia, unspecified: Secondary | ICD-10-CM

## 2023-04-07 DIAGNOSIS — K219 Gastro-esophageal reflux disease without esophagitis: Secondary | ICD-10-CM | POA: Diagnosis present

## 2023-04-07 DIAGNOSIS — Z79890 Hormone replacement therapy (postmenopausal): Secondary | ICD-10-CM | POA: Diagnosis not present

## 2023-04-07 DIAGNOSIS — K640 First degree hemorrhoids: Secondary | ICD-10-CM | POA: Diagnosis present

## 2023-04-07 DIAGNOSIS — N1832 Chronic kidney disease, stage 3b: Secondary | ICD-10-CM | POA: Diagnosis present

## 2023-04-07 DIAGNOSIS — E860 Dehydration: Secondary | ICD-10-CM | POA: Diagnosis present

## 2023-04-07 DIAGNOSIS — I4821 Permanent atrial fibrillation: Secondary | ICD-10-CM | POA: Diagnosis present

## 2023-04-07 DIAGNOSIS — Z85820 Personal history of malignant melanoma of skin: Secondary | ICD-10-CM | POA: Diagnosis not present

## 2023-04-07 DIAGNOSIS — I5032 Chronic diastolic (congestive) heart failure: Secondary | ICD-10-CM | POA: Diagnosis present

## 2023-04-07 DIAGNOSIS — K5731 Diverticulosis of large intestine without perforation or abscess with bleeding: Secondary | ICD-10-CM | POA: Diagnosis present

## 2023-04-07 DIAGNOSIS — E785 Hyperlipidemia, unspecified: Secondary | ICD-10-CM | POA: Diagnosis present

## 2023-04-07 DIAGNOSIS — K644 Residual hemorrhoidal skin tags: Secondary | ICD-10-CM | POA: Diagnosis not present

## 2023-04-07 DIAGNOSIS — D120 Benign neoplasm of cecum: Secondary | ICD-10-CM | POA: Diagnosis present

## 2023-04-07 DIAGNOSIS — Z825 Family history of asthma and other chronic lower respiratory diseases: Secondary | ICD-10-CM | POA: Diagnosis not present

## 2023-04-07 DIAGNOSIS — K5730 Diverticulosis of large intestine without perforation or abscess without bleeding: Secondary | ICD-10-CM | POA: Diagnosis not present

## 2023-04-07 DIAGNOSIS — I130 Hypertensive heart and chronic kidney disease with heart failure and stage 1 through stage 4 chronic kidney disease, or unspecified chronic kidney disease: Secondary | ICD-10-CM | POA: Diagnosis present

## 2023-04-07 DIAGNOSIS — Z96642 Presence of left artificial hip joint: Secondary | ICD-10-CM | POA: Diagnosis present

## 2023-04-07 DIAGNOSIS — D123 Benign neoplasm of transverse colon: Secondary | ICD-10-CM | POA: Diagnosis present

## 2023-04-07 DIAGNOSIS — I739 Peripheral vascular disease, unspecified: Secondary | ICD-10-CM | POA: Diagnosis present

## 2023-04-07 DIAGNOSIS — K922 Gastrointestinal hemorrhage, unspecified: Secondary | ICD-10-CM | POA: Diagnosis not present

## 2023-04-07 DIAGNOSIS — N179 Acute kidney failure, unspecified: Secondary | ICD-10-CM | POA: Diagnosis present

## 2023-04-07 DIAGNOSIS — I4891 Unspecified atrial fibrillation: Secondary | ICD-10-CM | POA: Diagnosis not present

## 2023-04-07 DIAGNOSIS — D630 Anemia in neoplastic disease: Secondary | ICD-10-CM | POA: Diagnosis not present

## 2023-04-07 DIAGNOSIS — K5900 Constipation, unspecified: Secondary | ICD-10-CM | POA: Diagnosis present

## 2023-04-07 DIAGNOSIS — Z86718 Personal history of other venous thrombosis and embolism: Secondary | ICD-10-CM | POA: Diagnosis not present

## 2023-04-07 DIAGNOSIS — H409 Unspecified glaucoma: Secondary | ICD-10-CM | POA: Diagnosis present

## 2023-04-07 DIAGNOSIS — I69398 Other sequelae of cerebral infarction: Secondary | ICD-10-CM | POA: Diagnosis not present

## 2023-04-07 DIAGNOSIS — G629 Polyneuropathy, unspecified: Secondary | ICD-10-CM | POA: Diagnosis present

## 2023-04-07 DIAGNOSIS — K921 Melena: Secondary | ICD-10-CM | POA: Diagnosis not present

## 2023-04-07 LAB — CBC
HCT: 32.6 % — ABNORMAL LOW (ref 36.0–46.0)
Hemoglobin: 10.3 g/dL — ABNORMAL LOW (ref 12.0–15.0)
MCH: 29.1 pg (ref 26.0–34.0)
MCHC: 31.6 g/dL (ref 30.0–36.0)
MCV: 92.1 fL (ref 80.0–100.0)
Platelets: 241 10*3/uL (ref 150–400)
RBC: 3.54 MIL/uL — ABNORMAL LOW (ref 3.87–5.11)
RDW: 17 % — ABNORMAL HIGH (ref 11.5–15.5)
WBC: 9.4 10*3/uL (ref 4.0–10.5)
nRBC: 0 % (ref 0.0–0.2)

## 2023-04-07 LAB — BASIC METABOLIC PANEL
Anion gap: 6 (ref 5–15)
BUN: 23 mg/dL (ref 8–23)
CO2: 22 mmol/L (ref 22–32)
Calcium: 8.8 mg/dL — ABNORMAL LOW (ref 8.9–10.3)
Chloride: 113 mmol/L — ABNORMAL HIGH (ref 98–111)
Creatinine, Ser: 1.44 mg/dL — ABNORMAL HIGH (ref 0.44–1.00)
GFR, Estimated: 33 mL/min — ABNORMAL LOW (ref >60)
Glucose, Bld: 102 mg/dL — ABNORMAL HIGH (ref 70–99)
Potassium: 4.6 mmol/L (ref 3.5–5.1)
Sodium: 141 mmol/L (ref 135–145)

## 2023-04-07 MED ORDER — POLYETHYLENE GLYCOL 3350 17 GM/SCOOP PO POWD
1.0000 | Freq: Once | ORAL | Status: AC
  Administered 2023-04-07: 255 g via ORAL
  Filled 2023-04-07: qty 255

## 2023-04-07 NOTE — [place] FL2 (Signed)
[place] MEDICAID FL2 LEVEL OF CARE FORM     IDENTIFICATION  Patient Name: [name] Birthdate: [birth year] Sex: female Admission Date (Current Location): 04/06/2023  [facility] and [place] Number:  Producer, television/film/video and Address:  [facility],  [address] [place], [place] [address]      Provider Number: [id number]  Attending Physician Name and Address:  Tyrone Nine, MD  Relative Name and Phone Number:  Milta Deiters [phone]    Current Level of Care: Hospital Recommended Level of Care: Nursing Facility Prior Approval Number:    Date Approved/Denied:   PASRR Number:    Discharge Plan: SNF    Current Diagnoses: Patient Active Problem List   Diagnosis Date Noted   BRBPR (bright red blood per rectum) 04/06/2023   Hematochezia 04/03/2023   AAA (abdominal aortic aneurysm) (HCC) 04/03/2023   CKD stage 3b, GFR 30-44 ml/min (HCC) 04/03/2023   Acute lower GI bleeding 04/02/2023   Primary osteoarthritis of left knee 08/14/2022   Urinary incontinence 08/14/2022   Acute on chronic diastolic HF (heart failure) (HCC)    Persistent atrial fibrillation (HCC)    ICH (intracerebral hemorrhage) (HCC) 03/25/2022   Hypertensive emergency    Osteoporosis 10/24/2021   Chronic anticoagulation 01/17/2021   Parastomal hernia s/p colostomy takedown & repair 01/17/2021 01/17/2021   Polymyalgia rheumatica (HCC) 01/11/2021   (HFpEF) heart failure with preserved ejection fraction (HCC) 10/27/2020   Injury of extensor or abductor muscle, fascia, or tendon of thumb at forearm level 08/03/2020   Orthostatic hypotension 05/24/2020   Acute blood loss anemia 05/19/2020   Traumatic hematoma of left hip 05/19/2020   Current chronic use of systemic steroids 05/19/2020   Advanced age 89/12/2019   Secondary hypercoagulable state (HCC) 03/08/2020   Hypokalemia 02/15/2020   History of diverticulitis of colon with perforation 01/26/2020   Anemia    Debility 01/14/2020   Physical  debility 01/14/2020   Chronic atrial fibrillation (HCC)    Recurrent falls 12/29/2019   Positive ANA (antinuclear antibody) 12/29/2019   Osteoarthritis 11/13/2017   Hypertriglyceridemia 01/29/2017   Excessive gas 01/09/2017   Physical exam 07/31/2016   Elevated lipase 03/06/2016   Chest pain 03/06/2016   Hypothyroidism 01/26/2016   HTN (hypertension) 01/26/2016   Left leg numbness 10/06/2012    Orientation RESPIRATION BLADDER Height & Weight     Self, Time, Situation, Place  Normal Incontinent Weight: 137 lb (62.1 kg) Height:  5\' 6"  (167.6 cm)  BEHAVIORAL SYMPTOMS/MOOD NEUROLOGICAL BOWEL NUTRITION STATUS      Incontinent Diet (See discharge summary)  AMBULATORY STATUS COMMUNICATION OF NEEDS Skin   Limited Assist Verbally Normal                       Personal Care Assistance Level of Assistance  Bathing, Feeding, Dressing Bathing Assistance: Limited assistance Feeding assistance: Limited assistance Dressing Assistance: Limited assistance     Functional Limitations Info  Sight, Hearing, Speech Sight Info: Impaired Hearing Info: Adequate Speech Info: Adequate    SPECIAL CARE FACTORS FREQUENCY  PT (By licensed PT), OT (By licensed OT)     PT Frequency: 5x/wk OT Frequency: 5x/wk            Contractures Contractures Info: Not present    Additional Factors Info  Code Status, Allergies Code Status Info: FULL Allergies Info: Keflex (Cephalexin), Codeine           Current Medications (04/07/2023):  This is the current hospital active medication list  Current Facility-Administered Medications  Medication Dose Route Frequency Provider Last Rate Last Admin   0.9 %  sodium chloride infusion   Intravenous Continuous Kirby Crigler, Mir M, MD 75 mL/hr at 04/06/23 1604 New Bag at 04/06/23 1604   acetaminophen (TYLENOL) tablet 650 mg  650 mg Oral Q6H PRN Kirby Crigler, Mir M, MD       Or   acetaminophen (TYLENOL) suppository 650 mg  650 mg Rectal Q6H PRN Kirby Crigler, Mir  M, MD       albuterol (PROVENTIL) (2.5 MG/3ML) 0.083% nebulizer solution 2.5 mg  2.5 mg Nebulization Q2H PRN Kirby Crigler, Mir M, MD       brimonidine (ALPHAGAN) 0.2 % ophthalmic solution 1 drop  1 drop Right Eye BID Kirby Crigler, Mir M, MD   1 drop at 04/06/23 2241   diltiazem (CARDIZEM CD) 24 hr capsule 120 mg  120 mg Oral QPM Kirby Crigler, Mir M, MD   120 mg at 04/06/23 1845   dorzolamide-timolol (COSOPT) 2-0.5 % ophthalmic solution 1 drop  1 drop Right Eye BID Kirby Crigler, Mir M, MD   1 drop at 04/06/23 2241   gabapentin (NEURONTIN) capsule 200 mg  200 mg Oral QHS Kirby Crigler, Mir M, MD   200 mg at 04/06/23 2241   levothyroxine (SYNTHROID) tablet 50 mcg  50 mcg Oral QAC breakfast Kirby Crigler, Mir M, MD       melatonin tablet 6 mg  6 mg Oral QHS Kirby Crigler, Mir M, MD   6 mg at 04/06/23 2241   metoprolol tartrate (LOPRESSOR) injection 5 mg  5 mg Intravenous Q6H PRN Kirby Crigler, Mir M, MD       nadolol (CORGARD) tablet 80 mg  80 mg Oral Daily Kirby Crigler, Mir M, MD   80 mg at 04/06/23 1614   ondansetron (ZOFRAN) tablet 4 mg  4 mg Oral Q6H PRN Kirby Crigler, Mir M, MD       Or   ondansetron [facility]) injection 4 mg  4 mg Intravenous Q6H PRN Kirby Crigler, Mir M, MD       pantoprazole (PROTONIX) EC tablet 40 mg  40 mg Oral Daily Kirby Crigler, Mir M, MD   40 mg at 04/06/23 1845   polyethylene glycol powder (GLYCOLAX/MIRALAX) container 255 g  1 Container Oral Once Nandigam, Eleonore Chiquito, MD       traZODone (DESYREL) tablet 25 mg  25 mg Oral QHS PRN Kirby Crigler, Mir M, MD       trimethoprim (TRIMPEX) tablet 50 mg  50 mg Oral Daily Kirby Crigler, Mir M, MD         Discharge Medications: Please see discharge summary for a list of discharge medications.  Relevant Imaging Results:  Relevant Lab Results:   Additional Information SSN: [id number]  Otelia Santee, LCSW

## 2023-04-07 NOTE — Progress Notes (Signed)
[facility] PROGRESS NOTE  [name] (DOB: 28-Feb-1923) WUJ:[id number] PCP: Eloisa Northern, MD  Brief Narrative: [name] is a 89 y.o. female with a history of AFib, no longer on anticoagulation due to ICH May 2023, MCI, chronic HFpEF, DVT, stage IIIb CKD, PVD on aspirin, GERD, hypothyroidism, melanoma who was admitted 5/23-5/24 for rectal bleeding and returned to the ED from LTC facility on 04/06/2023 for recurrent painless hematochezia. Hgb 11.3g/dl, slightly below baseline. VSS. CT showed diverticulosis, constipation, and 3.2 cm aortic aneurysm, all stable.   Subjective: Had red blood in bowl overnight, none this morning. No abd pain. Hungry.   Objective: BP (!) 173/67 (BP Location: Right Arm)   Pulse 68   Temp 97.9 F (36.6 C) (Oral)   Resp 18   Ht 5\' 6"  (1.676 m)   Wt 62.1 kg   SpO2 98%   BMI 22.11 kg/m   Gen: Elderly, pleasant, no distress Pulm: Clear, nonlabored  CV: Irreg irreg, no MRG or pitting edema GI: Soft, NT, ND, +BS  Neuro: Alert and oriented. No new focal deficits. Ext: Warm, no deformities. Skin: No rashes, lesions or ulcers on visualized skin   Assessment & Plan: Painless hematochezia: Recurrent.  - DC aspirin. SCDs for VTE ppx. - Quantity of hematochezia not consistent with just internal hemorrhoids. Some maroon discoloration also raises doubts over purely left-sided diverticulosis. D/w GI today, CLD and prep today, NPO and colonoscopy tomorrow.  - Note pt's CrCl ~83ml/min makes CTA essentially contraindicated.   AKI on stage IIIb CKD:  - Since starting diet, will DC IVF to avoid overload (hx HFpEF) - Avoid nephrotoxins, contrast, and monitor BMP in AM  Persistent atrial fibrillation: Rate controlled - Not on [facility] due to ICH May 2023.  - Continue diltiazem, nadolol.   History of CVA, ICH with residual deficits:  - PT - Continue risk factor optimization. Statin intolerance noted.  Peripheral neuropathy:  - Continue gabapentin 200mg   nightly  HTN:  - Hold losartan with AKI and GI bleeding  Hypothyroidism: Last TSH 1.452.  - Continue synthroid  Goals of care: Noted pt's full code status. Will need to discuss with pt/family.   GERD:  - Continue PPI  History of UTI:  - No change to chronic ppx, needs renal dosing (trimethoprim 50mg  daily).  Glaucoma: Quiescent - Continue gtt's.   AAA: 3.2cm - Can't imagine there's much yield to repeat imaging for this in particular.   Tyrone Nine, MD [facility] [email or web address] 04/07/2023, 1:56 PM

## 2023-04-07 NOTE — TOC Initial Note (Signed)
Transition of Care [facility]) - Initial/Assessment Note    Patient Details  Name: [name] MRN: [id number] Date of Birth: June 17, 1923  [facility]) CM/SW Contact:    Otelia Santee, LCSW Phone Number: 04/07/2023, 11:40 AM  Clinical Narrative:                 Pt from [facility] where she is a LTC resident. Pt plans to return to [facility] at discharge. TOC following for needs.  Expected Discharge Plan: Long Term Nursing Home Barriers to Discharge: Continued Medical Work up   Patient Goals and CMS Choice Patient states their goals for this hospitalization and ongoing recovery are:: To return to [facility].gov Compare Post Acute Care list provided to:: Patient Choice offered to / list presented to : Patient [place] ownership interest in [facility].provided to:: Patient    Expected Discharge Plan and Services In-house Referral: Clinical Social Work Discharge Planning Services: NA Post Acute Care Choice: Skilled Nursing Facility, Nursing Home Living arrangements for the past 2 months: Skilled Nursing Facility                 DME Arranged: N/A DME Agency: NA                  Prior Living Arrangements/Services Living arrangements for the past 2 months: Skilled Nursing Facility Lives with:: Facility Resident Patient language and need for interpreter reviewed:: Yes Do you feel safe going back to the place where you live?: Yes      Need for Family Participation in Patient Care: No (Comment) Care giver support system in place?: Yes (comment) Current home services: DME Criminal Activity/Legal Involvement Pertinent to Current Situation/Hospitalization: No - Comment as needed  Activities of Daily Living      Permission Sought/Granted Permission sought to share information with : Facility Medical sales representative, Family Supports Permission granted to share information with : Yes, Verbal Permission Granted  Share Information with  NAME: Khaliah Boehlke  Permission granted to share info w AGENCY: [employer]  Permission granted to share info w Relationship: Son  Permission granted to share info w Contact Information: [phone]  Emotional Assessment Appearance:: Appears younger than stated age Attitude/Demeanor/Rapport: Engaged Affect (typically observed): Accepting Orientation: : Oriented to Self, Oriented to Place, Oriented to  Time, Oriented to Situation Alcohol / Substance Use: Not Applicable Psych Involvement: No (comment)  Admission diagnosis:  BRBPR (bright red blood per rectum) [K62.5] Gastrointestinal hemorrhage, unspecified gastrointestinal hemorrhage type [K92.2] Patient Active Problem List   Diagnosis Date Noted   BRBPR (bright red blood per rectum) 04/06/2023   Hematochezia 04/03/2023   AAA (abdominal aortic aneurysm) (HCC) 04/03/2023   CKD stage 3b, GFR 30-44 ml/min (HCC) 04/03/2023   Acute lower GI bleeding 04/02/2023   Primary osteoarthritis of left knee 08/14/2022   Urinary incontinence 08/14/2022   Acute on chronic diastolic HF (heart failure) (HCC)    Persistent atrial fibrillation (HCC)    ICH (intracerebral hemorrhage) (HCC) 03/25/2022   Hypertensive emergency    Osteoporosis 10/24/2021   Chronic anticoagulation 01/17/2021   Parastomal hernia s/p colostomy takedown & repair 01/17/2021 01/17/2021   Polymyalgia rheumatica (HCC) 01/11/2021   (HFpEF) heart failure with preserved ejection fraction (HCC) 10/27/2020   Injury of extensor or abductor muscle, fascia, or tendon of thumb at forearm level 08/03/2020   Orthostatic hypotension 05/24/2020   Acute blood loss anemia 05/19/2020   Traumatic hematoma of left hip 05/19/2020   Current chronic use of systemic steroids  05/19/2020   Advanced age 38/12/2019   Secondary hypercoagulable state (HCC) 03/08/2020   Hypokalemia 02/15/2020   History of diverticulitis of colon with perforation 01/26/2020   Anemia    Debility 01/14/2020   Physical  debility 01/14/2020   Chronic atrial fibrillation (HCC)    Recurrent falls 12/29/2019   Positive ANA (antinuclear antibody) 12/29/2019   Osteoarthritis 11/13/2017   Hypertriglyceridemia 01/29/2017   Excessive gas 01/09/2017   Physical exam 07/31/2016   Elevated lipase 03/06/2016   Chest pain 03/06/2016   Hypothyroidism 01/26/2016   HTN (hypertension) 01/26/2016   Left leg numbness 10/06/2012   PCP:  Eloisa Northern, MD Pharmacy:   [facility] [phone] - [place], [place] - [address] [place] [address] Phone: [phone] Fax: [phone]  [facility] - [place], [place] - [address] [place] [address] Phone: [phone] Fax: [phone]     Social Determinants of Health (SDOH) Social History: SDOH Screenings   Food Insecurity: No Food Insecurity (04/03/2023)  Housing: Low Risk  (04/03/2023)  Transportation Needs: No Transportation Needs (04/03/2023)  Utilities: Not At Risk (04/03/2023)  Alcohol Screen: Low Risk  (12/27/2021)  Depression (PHQ2-9): Low Risk  (04/02/2023)  Financial Resource Strain: Low Risk  (12/27/2021)  Physical Activity: Insufficiently Active (12/27/2021)  Social Connections: Moderately Integrated (12/27/2021)  Stress: No Stress Concern Present (12/27/2021)  Tobacco Use: Low Risk  (04/06/2023)   SDOH Interventions:     Readmission Risk Interventions    04/04/2023   11:25 AM  Readmission Risk Prevention Plan  Transportation Screening Complete  PCP or Specialist Appt within 5-7 Days Complete  Home Care Screening Complete  Medication Review (RN CM) Complete

## 2023-04-07 NOTE — Consult Note (Signed)
Consultation  Referring Provider:    [facility] Primary Care Physician:  Eloisa Northern, MD Primary Gastroenterologist:     Gentry Fitz    Reason for Consultation:     Hematochezia            HPI:   [name] is a 89 y.o. female  with past medical history significant for A-fib, no longer on anticoagulation due to intracerebral hemorrhage May 2023), right-sided weakness/balance difficulties from intracerebral hemorrhage, mild cognitive impairment, chronic HFpEF, anemia, DVT, GERD, hypothyroidism, melanoma, PVD, CKD stage IIIb presents for evaluation of ongoing rectal bleeding.   Patient presents to the ER for ongoing rectal bleeding.  She was recently hospitalized with same problem and discharged to nursing home on May 24.    CT abdomen pelvis without contrast shows colonic diverticulosis and constipation without bowel obstruction.  3.2 cm infrarenal abdominal aortic aneurysm.   She continued to have multiple bowel movements with bright red blood and also clots with burgundy dark colored stool.  On average she was having 3 bowel movements a day.  Denies any abdominal pain, nausea, vomiting or melena.   Takes baby ASA daily, no further NSAIDs. Denies alcohol and tobacco.  In the ER hemoglobin 11.3, slightly lower compared to her baseline.   Reports history of colonoscopy with Dr. Kinnie Scales for screening more than 20 years ago (report noted in chart 1999, not available to view). Denies history of polyps. States colon was normal.    Past Medical History:  Diagnosis Date   Closed fracture of left distal radius 12/29/2019   DVT (deep venous thrombosis) (HCC) 09/2015   RLE   Dysrhythmia    Afib   GERD (gastroesophageal reflux disease)    Hip fracture (HCC) 12/02/2020   Hypertension    Hypothyroidism    Melanoma (HCC) 06/16/2017   Facial melanoma, removed by Dr. Alean Rinne   Peripheral vascular disease [facility])    peripheral neuropathy    Past Surgical History:  Procedure Laterality Date    ABDOMINAL HYSTERECTOMY     APPENDECTOMY     BACK SURGERY     BREAST SURGERY     left breast biopsy   CARDIOVERSION N/A 05/09/2020   Procedure: CARDIOVERSION;  Surgeon: Chilton Si, MD;  Location: [facility] ENDOSCOPY;  Service: Cardiovascular;  Laterality: N/A;   COLOSTOMY N/A 01/06/2020   Procedure: Colostomy;  Surgeon: Gaynelle Adu, MD;  Location: [facility] OR;  Service: General;  Laterality: N/A;   EYE SURGERY     cataract   HARDWARE REMOVAL Left 01/01/2020   Procedure: HARDWARE REMOVAL;  Surgeon: Bradly Bienenstock, MD;  Location: [facility] OR;  Service: Orthopedics;  Laterality: Left;   HIP ARTHROPLASTY Left 12/03/2020   Procedure: ARTHROPLASTY BIPOLAR HIP (HEMIARTHROPLASTY);  Surgeon: Sheral Apley, MD;  Location: [facility] OR;  Service: Orthopedics;  Laterality: Left;   HIP ARTHROPLASTY Left    partial   IR KYPHO EA ADDL LEVEL THORACIC OR LUMBAR  06/27/2021   IR RADIOLOGIST EVAL & MGMT  07/13/2021   LAPAROTOMY N/A 01/06/2020   Procedure: Exploratory Laparotomy, Open Sigmoid colectomy;  Surgeon: Gaynelle Adu, MD;  Location: [facility] OR;  Service: General;  Laterality: N/A;   LIPOMA EXCISION Left 04/25/2015   Procedure: EXCISION OF LIPOMA LEFT ARM;  Surgeon: Manus Rudd, MD;  Location: [facility];  Service: General;  Laterality: Left;   LUMBAR LAMINECTOMY/DECOMPRESSION MICRODISCECTOMY  11/20/2011   Procedure: LUMBAR LAMINECTOMY/DECOMPRESSION MICRODISCECTOMY;  Surgeon: Illene Labrador [place];  Location: [facility] ORS;  Service: Orthopedics;  Laterality:  N/A;  Decompressive Laminectomy L2 to the Sacrum (X-Ray)   LYSIS OF ADHESION N/A 01/17/2021   Procedure: RIGID PROCTOSCOPY;  Surgeon: Karie Soda, MD;  Location: [facility] ORS;  Service: General;  Laterality: N/A;   OPEN REDUCTION INTERNAL FIXATION (ORIF) DISTAL RADIAL FRACTURE Left 01/01/2020   Procedure: OPEN REDUCTION INTERNAL FIXATION (ORIF) DISTAL RADIAL FRACTURE;  Surgeon: Bradly Bienenstock, MD;  Location: [facility] OR;  Service: Orthopedics;  Laterality: Left;   OTHER SURGICAL  HISTORY     left wrist surgery - has plate in left wrist    OTHER SURGICAL HISTORY     right knee surgery due to torn cartilage    TONSILLECTOMY     WRIST FRACTURE SURGERY Left    XI ROBOTIC ASSISTED COLOSTOMY TAKEDOWN N/A 01/17/2021   Procedure: XI ROBOTIC ASSISTED OSTOMY TAKEDOWN, LYSIS OF ADHESIONS, RECTOSIGMOID RESECTION, BILATERAL TAP BLOCK;  Surgeon: Karie Soda, MD;  Location: [facility] ORS;  Service: General;  Laterality: N/A;    Family History  Problem Relation Age of Onset   Cancer Mother        BREAST   COPD Father    Emphysema Father    Cancer Daughter        breast     Social History   Tobacco Use   Smoking status: Never   Smokeless tobacco: Never  Vaping Use   Vaping Use: Never used  Substance Use Topics   Alcohol use: No   Drug use: No    Prior to Admission medications   Medication Sig Start Date End Date Taking? Authorizing Provider  acetaminophen (TYLENOL) 325 MG tablet Take 650 mg by mouth every 6 (six) hours as needed for mild pain or moderate pain.   Yes [provider]  aspirin EC 81 MG tablet Take 1 tablet (81 mg total) by mouth daily. Swallow whole. 07/18/22  Yes Micki Riley, MD  brimonidine (ALPHAGAN) 0.2 % ophthalmic solution Place 1 drop into the right eye 2 (two) times daily.   Yes [provider]  diltiazem (CARDIZEM CD) 120 MG 24 hr capsule Take 1 capsule (120 mg total) by mouth every evening. 04/30/22  Yes Setzer, Lynnell Jude, PA-C  DORZOLAMIDE HCL-TIMOLOL MAL OP Place 1 drop into the right eye in the morning and at bedtime. Cosopt 22.3 mg/6.8 mg/ml   Yes [provider]  gabapentin (NEURONTIN) 100 MG capsule Take 2 capsules (200 mg total) by mouth at bedtime. 04/30/22  Yes Setzer, Lynnell Jude, PA-C  hydrocortisone (ANUSOL-HC) 2.5 % rectal cream Place 1 Application rectally every 6 (six) hours as needed for hemorrhoids or anal itching.   Yes [provider]  hydrocortisone cream 1 % Apply 1 Application topically 3 (three)  times daily as needed for itching.   Yes [provider]  hydroxypropyl methylcellulose / hypromellose (ISOPTO TEARS / GONIOVISC) 2.5 % ophthalmic solution Place 1 drop into the left eye 4 (four) times daily.   Yes [provider]  levothyroxine (SYNTHROID) 50 MCG tablet Take 1 tablet (50 mcg total) by mouth daily before breakfast. 04/30/22  Yes Love, Evlyn Kanner, PA-C  loperamide (IMODIUM A-D) 2 MG tablet Take 2 mg by mouth 4 (four) times daily as needed for diarrhea or loose stools.   Yes [provider]  losartan (COZAAR) 50 MG tablet Take 50 mg by mouth daily.   Yes [provider]  melatonin 3 MG TABS tablet Take 6 mg by mouth at bedtime.   Yes [provider]  Menthol, Topical Analgesic, (BIOFREEZE)  4 % GEL Apply 1 Application topically daily as needed (pain).   Yes [provider]  Multiple Vitamins-Minerals (PRESERVISION AREDS 2 PO) Take 1 tablet by mouth daily with lunch.   Yes [provider]  nadolol (CORGARD) 80 MG tablet Take 1 tablet (80 mg total) by mouth daily. 05/01/22  Yes Setzer, Lynnell Jude, PA-C  nitroGLYCERIN (NITROSTAT) 0.4 MG SL tablet Place 1 tablet (0.4 mg total) under the tongue every 5 (five) minutes as needed for chest pain. 04/30/22  Yes Setzer, Lynnell Jude, PA-C  ondansetron (ZOFRAN) 4 MG tablet Take 4 mg by mouth every 6 (six) hours as needed for nausea or vomiting.   Yes [provider]  pantoprazole (PROTONIX) 40 MG tablet Take 1 tablet (40 mg total) by mouth daily. 04/30/22  Yes Setzer, Lynnell Jude, PA-C  Polyethylene Glycol 3350 (MIRALAX PO) Take 1 packet by mouth daily.   Yes [provider]  polyvinyl alcohol (LIQUIFILM TEARS) 1.4 % ophthalmic solution Place 1 drop into the right eye as needed for dry eyes. 04/30/22  Yes Setzer, Lynnell Jude, PA-C  predniSONE (DELTASONE) 1 MG tablet Take 2 tablets (2 mg total) by mouth daily with lunch. 04/30/22  Yes Setzer, Lynnell Jude, PA-C  trimethoprim (TRIMPEX) 100 MG  tablet Take 100 mg by mouth in the morning.   Yes [provider]  witch hazel-glycerin (TUCKS) pad Apply 1 Application topically as needed for itching.   Yes [provider]  hydrocortisone (ANUSOL-HC) 25 MG suppository Place 1 suppository (25 mg total) rectally at bedtime. Patient not taking: Reported on 04/06/2023 04/04/23   Briant Cedar, MD  Pollen Extracts (PROSTAT PO) Take 30 mLs by mouth daily. Patient not taking: Reported on 04/06/2023    [provider]    Current Facility-Administered Medications  Medication Dose Route Frequency Provider Last Rate Last Admin   0.9 %  sodium chloride infusion   Intravenous Continuous Kirby Crigler, Mir M, MD 75 mL/hr at 04/06/23 1604 New Bag at 04/06/23 1604   acetaminophen (TYLENOL) tablet 650 mg  650 mg Oral Q6H PRN Kirby Crigler, Mir M, MD       Or   acetaminophen (TYLENOL) suppository 650 mg  650 mg Rectal Q6H PRN Kirby Crigler, Mir M, MD       albuterol (PROVENTIL) (2.5 MG/3ML) 0.083% nebulizer solution 2.5 mg  2.5 mg Nebulization Q2H PRN Kirby Crigler, Mir M, MD       brimonidine (ALPHAGAN) 0.2 % ophthalmic solution 1 drop  1 drop Right Eye BID Kirby Crigler, Mir M, MD   1 drop at 04/06/23 2241   diltiazem (CARDIZEM CD) 24 hr capsule 120 mg  120 mg Oral QPM Kirby Crigler, Mir M, MD   120 mg at 04/06/23 1845   dorzolamide-timolol (COSOPT) 2-0.5 % ophthalmic solution 1 drop  1 drop Right Eye BID Kirby Crigler, Mir M, MD   1 drop at 04/06/23 2241   gabapentin (NEURONTIN) capsule 200 mg  200 mg Oral QHS Kirby Crigler, Mir M, MD   200 mg at 04/06/23 2241   levothyroxine (SYNTHROID) tablet 50 mcg  50 mcg Oral QAC breakfast Kirby Crigler, Mir M, MD       melatonin tablet 6 mg  6 mg Oral QHS Kirby Crigler, Mir M, MD   6 mg at 04/06/23 2241   metoprolol tartrate (LOPRESSOR) injection 5 mg  5 mg Intravenous Q6H PRN Kirby Crigler, Mir M, MD       nadolol (CORGARD) tablet 80 mg  80 mg Oral Daily Kirby Crigler, Mir M, MD  80 mg at 04/06/23 1614   ondansetron  (ZOFRAN) tablet 4 mg  4 mg Oral Q6H PRN Kirby Crigler, Mir M, MD       Or   ondansetron [facility]) injection 4 mg  4 mg Intravenous Q6H PRN Kirby Crigler, Mir M, MD       pantoprazole (PROTONIX) EC tablet 40 mg  40 mg Oral Daily Kirby Crigler, Mir M, MD   40 mg at 04/06/23 1845   polyethylene glycol (MIRALAX / GLYCOLAX) packet 17 g  17 g Oral Daily Kirby Crigler, Mir M, MD   17 g at 04/06/23 1848   traZODone (DESYREL) tablet 25 mg  25 mg Oral QHS PRN Kirby Crigler, Mir M, MD       trimethoprim (TRIMPEX) tablet 50 mg  50 mg Oral Daily Kirby Crigler, Mir M, MD        Allergies as of 04/06/2023 - Review Complete 04/06/2023  Allergen Reaction Noted   Keflex [cephalexin] Nausea Only and Other (See Comments) 03/06/2016   Codeine Other (See Comments) 11/01/2011     Review of Systems:    This is positive for those things mentioned in the HPI. All other review of systems are negative.       Physical Exam:  Vital signs in last 24 hours: Temp:  [97.6 F (36.4 C)-98.2 F (36.8 C)] 97.8 F (36.6 C) (05/27 0403) Pulse Rate:  [52-77] 56 (05/27 0403) Resp:  [16-20] 16 (05/27 0403) BP: (119-149)/(67-81) 148/67 (05/27 0403) SpO2:  [98 %-99 %] 98 % (05/27 0403) Weight:  [62.1 kg] 62.1 kg (05/26 0941) Last BM Date : 04/06/23  General:  Well-developed, well-nourished and in no acute distress Eyes:  anicteric. Lungs: Clear to auscultation bilaterally. Heart:              S1-S2 irregular, no murmur Abdomen:  soft, non-tender, no hepatosplenomegaly, hernia, or mass and BS+.  Extremities:   no edema Skin   no rash. Neuro:  A&O x 3.  Psych:  appropriate mood and  Affect.   Data Reviewed:   LAB RESULTS: Recent Labs    04/06/23 0941 04/07/23 0448  WBC 8.8 9.4  HGB 11.3* 10.3*  HCT 35.6* 32.6*  PLT 270 241   BMET Recent Labs    04/06/23 0941 04/07/23 0448  NA 139 141  K 4.2 4.6  CL 109 113*  CO2 23 22  GLUCOSE 102* 102*  BUN 26* 23  CREATININE 1.68* 1.44*  CALCIUM 9.0 8.8*   LFT Recent Labs     04/06/23 0941  PROT 6.3*  ALBUMIN 3.3*  AST 13*  ALT 11  ALKPHOS 96  BILITOT 0.8   PT/INR Recent Labs    04/06/23 0941  LABPROT 13.8  INR 1.0    STUDIES: No results found.   PREVIOUS ENDOSCOPIES:            As per HPI    Impression / Plan:   89 year old very pleasant female with history of permanent A-fib, stage III CKD recurrent admission with painless hematochezia.  Hemoglobin trended further down to 10.3 this morning.  She is hemodynamically stable She is passing clots and dark burgundy stool along with bright red blood, will need to exclude diverticular hemorrhage or neoplastic lesion Given last colonoscopy was >20 years ago, will plan to proceed with colonoscopy for further evaluation and possible endoscopic intervention to stop the bleeding The risks and benefits as well as alternatives of endoscopic procedure(s) have been discussed and reviewed. All questions answered. The patient agrees to proceed.  Clear liquid diet Bowel prep with MiraLAX N.p.o. after 5 AM tomorrow  Monitor hemoglobin daily and transfuse if below 7   The patient and her sons at bedside were provided an opportunity to ask questions and all were answered. They all agreed with the plan and demonstrated an understanding of the instructions.        Iona Beard , MD [phone]

## 2023-04-08 DIAGNOSIS — I4891 Unspecified atrial fibrillation: Secondary | ICD-10-CM | POA: Diagnosis not present

## 2023-04-08 DIAGNOSIS — K921 Melena: Secondary | ICD-10-CM | POA: Diagnosis not present

## 2023-04-08 DIAGNOSIS — K625 Hemorrhage of anus and rectum: Secondary | ICD-10-CM | POA: Diagnosis not present

## 2023-04-08 LAB — CBC
HCT: 33.4 % — ABNORMAL LOW (ref 36.0–46.0)
Hemoglobin: 10.6 g/dL — ABNORMAL LOW (ref 12.0–15.0)
MCH: 28.8 pg (ref 26.0–34.0)
MCHC: 31.7 g/dL (ref 30.0–36.0)
MCV: 90.8 fL (ref 80.0–100.0)
Platelets: 243 10*3/uL (ref 150–400)
RBC: 3.68 MIL/uL — ABNORMAL LOW (ref 3.87–5.11)
RDW: 16.8 % — ABNORMAL HIGH (ref 11.5–15.5)
WBC: 8.2 10*3/uL (ref 4.0–10.5)
nRBC: 0 % (ref 0.0–0.2)

## 2023-04-08 LAB — BASIC METABOLIC PANEL
Anion gap: 4 — ABNORMAL LOW (ref 5–15)
BUN: 16 mg/dL (ref 8–23)
CO2: 21 mmol/L — ABNORMAL LOW (ref 22–32)
Calcium: 8.7 mg/dL — ABNORMAL LOW (ref 8.9–10.3)
Chloride: 112 mmol/L — ABNORMAL HIGH (ref 98–111)
Creatinine, Ser: 1.12 mg/dL — ABNORMAL HIGH (ref 0.44–1.00)
GFR, Estimated: 44 mL/min — ABNORMAL LOW (ref >60)
Glucose, Bld: 99 mg/dL (ref 70–99)
Potassium: 3.9 mmol/L (ref 3.5–5.1)
Sodium: 137 mmol/L (ref 135–145)

## 2023-04-08 MED ORDER — PEG-KCL-NACL-NASULF-NA ASC-C 100 G PO SOLR: 1.0000 | Freq: Once | ORAL | Status: DC

## 2023-04-08 MED ORDER — PEG-KCL-NACL-NASULF-NA ASC-C 100 G PO SOLR
0.5000 | Freq: Once | ORAL | Status: AC
  Administered 2023-04-09: 100 g via ORAL

## 2023-04-08 MED ORDER — PEG-KCL-NACL-NASULF-NA ASC-C 100 G PO SOLR
0.5000 | Freq: Once | ORAL | Status: AC
  Administered 2023-04-08: 100 g via ORAL
  Filled 2023-04-08: qty 1

## 2023-04-08 NOTE — Anesthesia Preprocedure Evaluation (Signed)
Anesthesia Evaluation  Patient identified by MRN, date of birth, ID band Patient awake    Reviewed: Allergy & Precautions, NPO status , Patient's Chart, lab work & pertinent test results  Airway Mallampati: III  TM Distance: >3 FB Neck ROM: Full   Comment: Previous grade I view with Miller 2, easy mask Dental no notable dental hx.    Pulmonary neg pulmonary ROS   Pulmonary exam normal        Cardiovascular hypertension (losartan, diltiazem, nadolol), Pt. on medications and Pt. on home beta blockers + Peripheral Vascular Disease, +CHF (diastolic) and + DVT (09/2015 in RLE)  Normal cardiovascular exam+ dysrhythmias Atrial Fibrillation   3.2 cm infrarenal abdominal aortic aneurysm  TTE 03/25/2022: IMPRESSIONS     1. Left ventricular ejection fraction, by estimation, is 55 to 60%. The  left ventricle has normal function. The left ventricle has no regional  wall motion abnormalities. There is mild concentric left ventricular  hypertrophy. Left ventricular diastolic  parameters are indeterminate. Elevated left ventricular end-diastolic  pressure.   2. Right ventricular systolic function is normal. The right ventricular  size is normal.   3. Left atrial size was mildly dilated.   4. The mitral valve is normal in structure. No evidence of mitral valve  regurgitation. No evidence of mitral stenosis.   5. The aortic valve is normal in structure. Aortic valve regurgitation is  trivial. Aortic valve sclerosis is present, with no evidence of aortic  valve stenosis. Aortic regurgitation PHT measures 519 msec.   6. There is dilatation of the ascending aorta, measuring 39 mm.   7. The inferior vena cava is normal in size with greater than 50%  respiratory variability, suggesting right atrial pressure of 3 mmHg.     Neuro/Psych H/o ICH negative neurological ROS  negative psych ROS   GI/Hepatic Neg liver ROS, Bowel prep,GERD  Medicated  and Controlled,,H/o colostomy   Endo/Other  Hypothyroidism    Renal/GU CRFRenal disease     Musculoskeletal negative musculoskeletal ROS (+) Arthritis , Osteoarthritis,  Osteoporosis, PMR   Abdominal   Peds  Hematology  (+) Blood dyscrasia, anemia   Anesthesia Other Findings lower GI bleed  Reproductive/Obstetrics                             Anesthesia Physical Anesthesia Plan  ASA: 3  Anesthesia Plan: MAC   Post-op Pain Management:    Induction: Intravenous  PONV Risk Score and Plan: 2 and Propofol infusion and Treatment may vary due to age or medical condition  Airway Management Planned: Nasal Cannula  Additional Equipment:   Intra-op Plan:   Post-operative Plan:   Informed Consent:      Dental advisory given  Plan Discussed with: CRNA  Anesthesia Plan Comments:        Anesthesia Quick Evaluation

## 2023-04-08 NOTE — Evaluation (Signed)
Physical Therapy Evaluation Patient Details Name: [name] MRN: [id number] DOB: [birth year] Today's Date: 04/08/2023  History of Present Illness  Pt is 89 yo female admitted 04/06/23 with painless hematochezia with colonoscopy planned for 04/08/23.  Pt with hx including but not limited to afib, ICH 5/23, MCI, HF, DVT, CKD, PVD, GERD, hypothyroidism, melanoma  Clinical Impression  Pt admitted with above diagnosis. At baseline, pt from [facility] but reports was getting therapy 4 x week.  She reports she could pivot to chair on her own and was walking during therapy using RW and with chair follow.  Today, pt requiring min A for transfers and did ambulate 50'x2 with RW and contact guard.  Pt appears only slightly declined from her baseline - will maintain on caseload to advance.  Recommend transfer back to LTC at [facility] at d/c.  Pt currently with functional limitations due to the deficits listed below (see PT Problem List). Pt will benefit from acute skilled PT to increase their independence and safety with mobility to allow discharge.          Recommendations for follow up therapy are one component of a multi-disciplinary discharge planning process, led by the attending physician.  Recommendations may be updated based on patient status, additional functional criteria and insurance authorization.  Follow Up Recommendations Can patient physically be transported by private vehicle: Yes     Assistance Recommended at Discharge Intermittent Supervision/Assistance  Patient can return home with the following  A little help with walking and/or transfers;A little help with bathing/dressing/bathroom;Assistance with cooking/housework;Help with stairs or ramp for entrance    Equipment Recommendations None recommended by PT  Recommendations for Other Services       Functional Status Assessment Patient has had a recent decline in their functional status and demonstrates the ability to make  significant improvements in function in a reasonable and predictable amount of time.     Precautions / Restrictions Precautions Precautions: Fall Restrictions Weight Bearing Restrictions: No      Mobility  Bed Mobility Overal bed mobility: Needs Assistance Bed Mobility: Supine to Sit     Supine to sit: Supervision          Transfers Overall transfer level: Needs assistance Equipment used: Rolling walker (2 wheels) (holding chair) Transfers: Sit to/from Stand, Bed to chair/wheelchair/BSC Sit to Stand: Min assist     Squat pivot transfers: Min assist     General transfer comment: Pt stood from chair x 2 with RW and light min A to steady.  She partially stood once from bed holding onto armrest of chair to demonstrate squat pivot - light min A to steady    Ambulation/Gait Ambulation/Gait assistance: Min guard Gait Distance (Feet): 50 Feet (50'x2) Assistive device: Rolling walker (2 wheels) Gait Pattern/deviations: Step-through pattern, Decreased stride length, Decreased weight shift to right, Decreased dorsiflexion - right Gait velocity: decreased     General Gait Details: R foot slight decrease in DF and tends to turn in but was stable.  She amblulated 50'x2 with RW and chair follow  Stairs            Wheelchair Mobility    Modified Rankin (Stroke Patients Only)       Balance Overall balance assessment: Needs assistance Sitting-balance support: No upper extremity supported Sitting balance-Leahy Scale: Good     Standing balance support: Bilateral upper extremity supported Standing balance-Leahy Scale: Poor Standing balance comment: Min guard to min A with UE support  Pertinent Vitals/Pain Pain Assessment Pain Assessment: No/denies pain    Home Living Family/patient expects to be discharged to:: Skilled nursing facility                   Additional Comments: long term at [facility]    Prior  Function Prior Level of Function : Needs assist             Mobility Comments: Pt initially reports w/c bound with little use of R LE but then reports ambulating with RW during therapy.  States she could pivot to her w/c on her own and mobilize in w/c on her own. ADLs Comments: Pt able to do toileting but has assist wtih dressing and bathing     Hand Dominance        Extremity/Trunk Assessment   Upper Extremity Assessment Upper Extremity Assessment: LUE deficits/detail;RUE deficits/detail RUE Deficits / Details: ROM WFL; MMT grossly 4/5 RUE Sensation: WNL LUE Deficits / Details: ROM: L shoulder elevation limited baseline due to prior injury; MMT: elbow/hand 4/5, shoulder 3/5 LUE Sensation: WNL    Lower Extremity Assessment Lower Extremity Assessment: LLE deficits/detail;RLE deficits/detail RLE Deficits / Details: ROM WFL; MMT 4/5 grossly RLE Sensation: decreased light touch (reports numb since ICH) LLE Deficits / Details: ROM WFL; MMT: ankle 5/5, knee 5/5, hip 4/5 LLE Sensation:  (reports tingling baseline)    Cervical / Trunk Assessment Cervical / Trunk Assessment: Normal  Communication   Communication: HOH  Cognition Arousal/Alertness: Awake/alert Behavior During Therapy: WFL for tasks assessed/performed Overall Cognitive Status: No family/caregiver present to determine baseline cognitive functioning                                 General Comments: Overall follows commands and oriented x 4.  Some incorrect answers at times but seems related to [facility].  Did provide varied responses in regards to PLOF        General Comments General comments (skin integrity, edema, etc.): VSS    Exercises     Assessment/Plan    PT Assessment Patient needs continued PT services  PT Problem List Decreased strength;Decreased range of motion;Decreased activity tolerance;Decreased balance;Decreased mobility;Decreased knowledge of use of DME;Impaired sensation       PT  Treatment Interventions DME instruction;Therapeutic exercise;Gait training;Functional mobility training;Therapeutic activities;Patient/family education;Neuromuscular re-education;Balance training    PT Goals (Current goals can be found in the Care Plan section)  Acute Rehab PT Goals Patient Stated Goal: return to [facility] (hopeful for today) PT Goal Formulation: With patient Time For Goal Achievement: 04/22/23 Potential to Achieve Goals: Good    Frequency Min 1X/week     Co-evaluation               AM-PAC PT "6 Clicks" Mobility  Outcome Measure Help needed turning from your back to your side while in a flat bed without using bedrails?: A Little Help needed moving from lying on your back to sitting on the side of a flat bed without using bedrails?: A Little Help needed moving to and from a bed to a chair (including a wheelchair)?: A Little Help needed standing up from a chair using your arms (e.g., wheelchair or bedside chair)?: A Little Help needed to walk in hospital room?: A Little Help needed climbing 3-5 steps with a railing? : A Lot 6 Click Score: 17    End of Session Equipment Utilized During Treatment: Gait belt Activity Tolerance: Patient tolerated treatment  well Patient left: with chair alarm set;in chair;with call bell/phone within reach Nurse Communication: Mobility status PT Visit Diagnosis: Other abnormalities of gait and mobility (R26.89);Muscle weakness (generalized) (M62.81)    Time: 1610-9604 PT Time Calculation (min) (ACUTE ONLY): 23 min   Charges:   PT Evaluation $PT Eval Low Complexity: 1 Low PT Treatments $Gait Training: 8-22 mins        Anise Salvo, PT Acute Rehab [facility] [phone]   Rayetta Humphrey 04/08/2023, 11:11 AM

## 2023-04-08 NOTE — Progress Notes (Signed)
[facility] PROGRESS NOTE  [name] (DOB: 09/08/1923) WUJ:[id number] PCP: Eloisa Northern, MD  Brief Narrative: [name] is a 89 y.o. female with a history of AFib, no longer on anticoagulation due to ICH May 2023, MCI, chronic HFpEF, DVT, stage IIIb CKD, PVD on aspirin, GERD, hypothyroidism, melanoma who was admitted 5/23-5/24 for rectal bleeding and returned to the ED from LTC facility on 04/06/2023 for recurrent painless hematochezia. Hgb 11.3g/dl, slightly below baseline. VSS. CT showed diverticulosis, constipation, and 3.2 cm aortic aneurysm, all stable.   Subjective: Tolerated clean out, clear BM last, had coffee. No abd pain N or V. No gross bleeding.  Objective: BP (!) 158/87 (BP Location: Left Arm)   Pulse 75   Temp (!) 97.4 F (36.3 C) (Oral)   Resp 18   Ht 5\' 6"  (1.676 m)   Wt 62.1 kg   SpO2 99%   BMI 22.11 kg/m   Gen: No distress Pulm: Clear, nonlabored  CV: Irreg, no MRG or pitting edema GI: Soft, NT, ND, +BS Neuro: Alert and oriented. No new focal deficits. Ext: Warm, no deformities. Skin: No rashes, lesions or ulcers on visualized skin   Assessment & Plan: Painless hematochezia: Recurrent.  - DC'ed aspirin. SCDs for VTE ppx. - Colonoscopy this afternoon, keep NPO. Tolerated prep. Hgb stable.   - Note pt's CrCl ~2ml/min makes CTA essentially contraindicated.   AKI on stage IIIb CKD:  - Since starting diet, will DC IVF to avoid overload (hx HFpEF) - Avoid nephrotoxins, contrast, and monitor BMP in AM  Persistent atrial fibrillation: Rate controlled - Not on [facility] due to ICH May 2023.  - Continue diltiazem, nadolol.   History of CVA, ICH with residual deficits:  - PT - Continue risk factor optimization. Statin intolerance noted.  Peripheral neuropathy:  - Continue gabapentin 200mg  nightly  HTN:  - Hold losartan with AKI and GI bleeding  Hypothyroidism: Last TSH 1.452.  - Continue synthroid  Goals of care: Noted pt's full code status. Will  need to discuss with pt/family.   GERD:  - Continue PPI  History of UTI:  - No change to chronic ppx, needs renal dosing (trimethoprim 50mg  daily).  Glaucoma: Quiescent - Continue gtt's.   AAA: 3.2cm - Can't imagine there's much yield to repeat imaging for this in particular.   Tyrone Nine, MD [facility] [email or web address] 04/08/2023, 9:49 AM

## 2023-04-08 NOTE — Progress Notes (Signed)
Progress Note  Primary GI: Unassigned  LOS: 1 day   Chief Complaint:hematochezia   Subjective   Patient states she has had clear stools. NT notes stool is brown with minimal red streaks. Patient tolerated prep. Denies abdominal pain, nausea, and vomiting.   Objective   Vital signs in last 24 hours: Temp:  [97.4 F (36.3 C)-97.9 F (36.6 C)] 97.4 F (36.3 C) (05/28 0505) Pulse Rate:  [51-75] 75 (05/28 0505) Resp:  [18-20] 18 (05/28 0505) BP: (143-184)/(63-87) 158/87 (05/28 0505) SpO2:  [98 %-100 %] 99 % (05/28 0505) Last BM Date : 04/07/23 Last BM recorded by nurses in past 5 days Stool Type: Type 4 (Like a smooth, soft sausage or snake) (04/08/2023  8:00 AM)  General:   female in no acute distress  Heart:  Regular rate and rhythm; no murmurs Pulm: Clear anteriorly; no wheezing Abdomen: soft, nondistended, normal bowel sounds in all quadrants. Nontender without guarding. No organomegaly appreciated. Neurologic:  Alert and  oriented x4;  No focal deficits.  Psych:  Cooperative. Normal mood and affect.  Intake/Output from previous day: 05/27 0701 - 05/28 0700 In: 2485.4 [P.O.:960; I.V.:1525.4] Out: -  Intake/Output this shift: No intake/output data recorded.  Studies/Results: No results found.  Lab Results: Recent Labs    04/06/23 0941 04/07/23 0448 04/08/23 0534  WBC 8.8 9.4 8.2  HGB 11.3* 10.3* 10.6*  HCT 35.6* 32.6* 33.4*  PLT 270 241 243   BMET Recent Labs    04/06/23 0941 04/07/23 0448 04/08/23 0534  NA 139 141 137  K 4.2 4.6 3.9  CL 109 113* 112*  CO2 23 22 21*  GLUCOSE 102* 102* 99  BUN 26* 23 16  CREATININE 1.68* 1.44* 1.12*  CALCIUM 9.0 8.8* 8.7*   LFT Recent Labs    04/06/23 0941  PROT 6.3*  ALBUMIN 3.3*  AST 13*  ALT 11  ALKPHOS 96  BILITOT 0.8   PT/INR Recent Labs    04/06/23 0941  LABPROT 13.8  INR 1.0     Scheduled Meds:  brimonidine  1 drop Right Eye BID   diltiazem  120 mg Oral QPM   dorzolamide-timolol  1  drop Right Eye BID   gabapentin  200 mg Oral QHS   levothyroxine  50 mcg Oral QAC breakfast   melatonin  6 mg Oral QHS   nadolol  80 mg Oral Daily   pantoprazole  40 mg Oral Daily   trimethoprim  50 mg Oral Daily   Continuous Infusions:    Patient profile:   [name] is a 89 y.o. female  with past medical history significant for A-fib, no longer on anticoagulation due to intracerebral hemorrhage May 2023), right-sided weakness/balance difficulties from intracerebral hemorrhage, mild cognitive impairment, chronic HFpEF, anemia, DVT, GERD, hypothyroidism, melanoma, PVD, CKD stage IIIb presents for evaluation of ongoing rectal bleeding.   Hospitalized May 24th for same. CT shows diverticulosis and constipation.    Impression:   Hematochezia - hgb 10.3, stable - BUN 16, Cr. 1.12 - last colonoscopy > 20 years ago. Hgb stable. Minimal rectal bleeding. Patient completed prep but was continuing to have brown stools. Colonoscopy rescheduled. Held spot for tomorrow. She could've experienced diverticular bleed versus hemorrhoidal bleed. Though cannot rule out neoplastic lesion.  Afib - on ASA   Plan:   - reschedule colonoscopy. Hold spot for tomorrow, though if no further bleeding may hold off on colonoscopy all together. - Continue daily CBC and transfuse as needed to maintain  HGB > 7  - can have clear liquids - if plan is to continue with colonoscopy tomorrow, will need additional prep this evening.  Principal Problem:   BRBPR (bright red blood per rectum)   [name] M [name]  04/08/2023, 11:25 AM

## 2023-04-08 NOTE — H&P (View-Only) (Signed)
  Progress Note  Primary GI: Unassigned  LOS: 1 day   Chief Complaint:hematochezia   Subjective   Patient states she has had clear stools. NT notes stool is brown with minimal red streaks. Patient tolerated prep. Denies abdominal pain, nausea, and vomiting.   Objective   Vital signs in last 24 hours: Temp:  [97.4 F (36.3 C)-97.9 F (36.6 C)] 97.4 F (36.3 C) (05/28 0505) Pulse Rate:  [51-75] 75 (05/28 0505) Resp:  [18-20] 18 (05/28 0505) BP: (143-184)/(63-87) 158/87 (05/28 0505) SpO2:  [98 %-100 %] 99 % (05/28 0505) Last BM Date : 04/07/23 Last BM recorded by nurses in past 5 days Stool Type: Type 4 (Like a smooth, soft sausage or snake) (04/08/2023  8:00 AM)  General:   female in no acute distress  Heart:  Regular rate and rhythm; no murmurs Pulm: Clear anteriorly; no wheezing Abdomen: soft, nondistended, normal bowel sounds in all quadrants. Nontender without guarding. No organomegaly appreciated. Neurologic:  Alert and  oriented x4;  No focal deficits.  Psych:  Cooperative. Normal mood and affect.  Intake/Output from previous day: 05/27 0701 - 05/28 0700 In: 2485.4 [P.O.:960; I.V.:1525.4] Out: -  Intake/Output this shift: No intake/output data recorded.  Studies/Results: No results found.  Lab Results: Recent Labs    04/06/23 0941 04/07/23 0448 04/08/23 0534  WBC 8.8 9.4 8.2  HGB 11.3* 10.3* 10.6*  HCT 35.6* 32.6* 33.4*  PLT 270 241 243   BMET Recent Labs    04/06/23 0941 04/07/23 0448 04/08/23 0534  NA 139 141 137  K 4.2 4.6 3.9  CL 109 113* 112*  CO2 23 22 21*  GLUCOSE 102* 102* 99  BUN 26* 23 16  CREATININE 1.68* 1.44* 1.12*  CALCIUM 9.0 8.8* 8.7*   LFT Recent Labs    04/06/23 0941  PROT 6.3*  ALBUMIN 3.3*  AST 13*  ALT 11  ALKPHOS 96  BILITOT 0.8   PT/INR Recent Labs    04/06/23 0941  LABPROT 13.8  INR 1.0     Scheduled Meds:  brimonidine  1 drop Right Eye BID   diltiazem  120 mg Oral QPM   dorzolamide-timolol  1  drop Right Eye BID   gabapentin  200 mg Oral QHS   levothyroxine  50 mcg Oral QAC breakfast   melatonin  6 mg Oral QHS   nadolol  80 mg Oral Daily   pantoprazole  40 mg Oral Daily   trimethoprim  50 mg Oral Daily   Continuous Infusions:    Patient profile:   [name] is a 89 y.o. female  with past medical history significant for A-fib, no longer on anticoagulation due to intracerebral hemorrhage May 2023), right-sided weakness/balance difficulties from intracerebral hemorrhage, mild cognitive impairment, chronic HFpEF, anemia, DVT, GERD, hypothyroidism, melanoma, PVD, CKD stage IIIb presents for evaluation of ongoing rectal bleeding.   Hospitalized May 24th for same. CT shows diverticulosis and constipation.    Impression:   Hematochezia - hgb 10.3, stable - BUN 16, Cr. 1.12 - last colonoscopy > 20 years ago. Hgb stable. Minimal rectal bleeding. Patient completed prep but was continuing to have brown stools. Colonoscopy rescheduled. Held spot for tomorrow. She could've experienced diverticular bleed versus hemorrhoidal bleed. Though cannot rule out neoplastic lesion.  Afib - on ASA   Plan:   - reschedule colonoscopy. Hold spot for tomorrow, though if no further bleeding may hold off on colonoscopy all together. - Continue daily CBC and transfuse as needed to maintain   HGB > 7  - can have clear liquids - if plan is to continue with colonoscopy tomorrow, will need additional prep this evening.  Principal Problem:   BRBPR (bright red blood per rectum)   Zykerria Tanton M Daffney Greenly  04/08/2023, 11:25 AM    

## 2023-04-08 NOTE — Progress Notes (Addendum)
Patient reports having clear stools. No abdominal pain, nausea, or vomiting. Reports no further bleeding.  However, coffee cup was at bedside. Informed patient to be NPO for procedure. She reports she has only had clears until 5am and had half of her coffee (no cream) about 30 mins ago. Informed patient nothing more by mouth until post procedure.   Procedure scheduled for 1:45pm  ADDENDUM: NT reports brown stool. Will reschedule colonoscopy to tomorrow. However, if no further bleeding may be able to hold off on colonoscopy all together

## 2023-04-09 ENCOUNTER — Inpatient Hospital Stay (HOSPITAL_COMMUNITY): Payer: Medicare PPO | Admitting: Anesthesiology

## 2023-04-09 ENCOUNTER — Encounter (HOSPITAL_COMMUNITY): Payer: Self-pay | Admitting: Internal Medicine

## 2023-04-09 ENCOUNTER — Encounter (HOSPITAL_COMMUNITY)
Admission: EM | Disposition: A | Discharge status: Nursing Home (Includes ICF) | Payer: Self-pay | Source: Skilled Nursing Facility | Attending: Family Medicine

## 2023-04-09 DIAGNOSIS — K5730 Diverticulosis of large intestine without perforation or abscess without bleeding: Secondary | ICD-10-CM

## 2023-04-09 DIAGNOSIS — D120 Benign neoplasm of cecum: Secondary | ICD-10-CM

## 2023-04-09 DIAGNOSIS — K922 Gastrointestinal hemorrhage, unspecified: Secondary | ICD-10-CM

## 2023-04-09 DIAGNOSIS — D123 Benign neoplasm of transverse colon: Secondary | ICD-10-CM

## 2023-04-09 DIAGNOSIS — K644 Residual hemorrhoidal skin tags: Secondary | ICD-10-CM | POA: Diagnosis not present

## 2023-04-09 DIAGNOSIS — K640 First degree hemorrhoids: Secondary | ICD-10-CM | POA: Diagnosis not present

## 2023-04-09 DIAGNOSIS — D630 Anemia in neoplastic disease: Secondary | ICD-10-CM

## 2023-04-09 HISTORY — PX: COLONOSCOPY WITH PROPOFOL: SHX5780

## 2023-04-09 SURGERY — COLONOSCOPY WITH PROPOFOL
Anesthesia: Monitor Anesthesia Care

## 2023-04-09 MED ORDER — LIDOCAINE HCL 1 % IJ SOLN
INTRAMUSCULAR | Status: DC | PRN
  Administered 2023-04-09: 50 mg via INTRADERMAL

## 2023-04-09 MED ORDER — LACTATED RINGERS IV SOLN: INTRAVENOUS | Status: DC | PRN

## 2023-04-09 MED ORDER — TRIMETHOPRIM 100 MG PO TABS: 50.0000 mg | ORAL_TABLET | Freq: Every morning | ORAL | 0 refills | Status: AC

## 2023-04-09 MED ORDER — PROPOFOL 500 MG/50ML IV EMUL
INTRAVENOUS | Status: DC | PRN
  Administered 2023-04-09: 75 ug/kg/min via INTRAVENOUS

## 2023-04-09 MED ORDER — PROPOFOL 10 MG/ML IV BOLUS
INTRAVENOUS | Status: DC | PRN
  Administered 2023-04-09 (×3): 10 mg via INTRAVENOUS

## 2023-04-09 MED ORDER — HYDROCORTISONE ACETATE 25 MG RE SUPP: 25.0000 mg | Freq: Every day | RECTAL | 0 refills | Status: AC

## 2023-04-09 MED ORDER — SODIUM CHLORIDE 0.9 % IV SOLN: INTRAVENOUS | Status: DC

## 2023-04-09 SURGICAL SUPPLY — 22 items
ELECT REM PT RETURN 9FT ADLT (ELECTROSURGICAL)
ELECTRODE REM PT RTRN 9FT ADLT (ELECTROSURGICAL) IMPLANT
FCP BXJMBJMB 240X2.8X (CUTTING FORCEPS)
FLOOR PAD 36X40 (MISCELLANEOUS) ×1
FORCEPS BIOP RAD 4 LRG CAP 4 (CUTTING FORCEPS) IMPLANT
FORCEPS BIOP RJ4 240 W/NDL (CUTTING FORCEPS)
FORCEPS BXJMBJMB 240X2.8X (CUTTING FORCEPS) IMPLANT
INJECTOR/SNARE I SNARE (MISCELLANEOUS) IMPLANT
LUBRICANT JELLY 4.5OZ STERILE (MISCELLANEOUS) IMPLANT
MANIFOLD NEPTUNE II (INSTRUMENTS) IMPLANT
NDL SCLEROTHERAPY 25GX240 (NEEDLE) IMPLANT
NEEDLE SCLEROTHERAPY 25GX240 (NEEDLE) IMPLANT
PAD FLOOR 36X40 (MISCELLANEOUS) ×2 IMPLANT
PROBE APC STR FIRE (PROBE) IMPLANT
PROBE INJECTION GOLD (MISCELLANEOUS)
PROBE INJECTION GOLD 7FR (MISCELLANEOUS) IMPLANT
SNARE ROTATE MED OVAL 20MM (MISCELLANEOUS) IMPLANT
SYR 50ML LL SCALE MARK (SYRINGE) IMPLANT
TRAP SPECIMEN MUCOUS 40CC (MISCELLANEOUS) IMPLANT
TUBING ENDO SMARTCAP PENTAX (MISCELLANEOUS) IMPLANT
TUBING IRRIGATION ENDOGATOR (MISCELLANEOUS) ×2 IMPLANT
WATER STERILE IRR 1000ML POUR (IV SOLUTION) IMPLANT

## 2023-04-09 NOTE — Op Note (Signed)
[facility] Patient Name: [name] Procedure Date: 04/09/2023 MRN: [id number] Attending MD: Dub Amis. Tomasa Rand , MD, [id number] Date of Birth: 18-Jun-1923 CSN: [id number] Age: 89 Admit Type: Inpatient Procedure:                Colonoscopy Indications:              Hematochezia, profuse, painless, without                            significant drop in hemoglobin Providers:                Franchot Pollitt E. Tomasa Rand, MD, Stephens Shire RN, RN,                            Kandice Robinsons, Technician Referring MD:              Medicines:                Monitored Anesthesia Care Complications:            No immediate complications. Estimated Blood Loss:     Estimated blood loss: none. Procedure:                Pre-Anesthesia Assessment:                           - Prior to the procedure, a History and Physical                            was performed, and patient medications and                            allergies were reviewed. The patient's tolerance of                            previous anesthesia was also reviewed. The risks                            and benefits of the procedure and the sedation                            options and risks were discussed with the patient.                            All questions were answered, and informed consent                            was obtained. Prior Anticoagulants: The patient has                            taken no anticoagulant or antiplatelet agents                            except for aspirin. ASA Grade Assessment: III - A  patient with severe systemic disease. After                            reviewing the risks and benefits, the patient was                            deemed in satisfactory condition to undergo the                            procedure.                           After obtaining informed consent, the colonoscope                            was passed under direct vision. Throughout  the                            procedure, the patient's blood pressure, pulse, and                            oxygen saturations were monitored continuously. The                            CF-HQ190L ([id number]) Olympus colonoscope was                            introduced through the anus and advanced to the the                            cecum, identified by appendiceal orifice and                            ileocecal valve. The colonoscopy was performed                            without difficulty. The patient tolerated the                            procedure well. The quality of the bowel                            preparation was adequate. The ileocecal valve,                            appendiceal orifice, and rectum were photographed.                            The bowel preparation used was MoviPrep via                            extended prep with split dose instruction. Scope In: 1:22:47 PM Scope Out: 1:41:57 PM Scope Withdrawal Time: 0 hours 8 minutes 4 seconds  Total Procedure Duration: 0 hours 19 minutes 10 seconds  Findings:  Skin tags were found on perianal exam.      The digital rectal exam was normal. Pertinent negatives include normal       sphincter tone and no palpable rectal lesions.      Six sessile, non-bleeding polyps were found in the splenic flexure,       transverse colon and cecum. The polyps were 2 to 7 mm in size.      There was evidence of a prior end-to-end colo-colonic anastomosis in the       distal sigmoid colon. This was patent and was characterized by healthy       appearing mucosa.      Many large-mouthed, medium-mouthed and small-mouthed diverticula were       found in the descending colon, transverse colon and ascending colon.       There was no evidence of diverticular bleeding.      The exam was otherwise normal throughout the examined colon.      Non-bleeding internal hemorrhoids were found during retroflexion. The       hemorrhoids were  large and Grade I (internal hemorrhoids that do not       prolapse).      No additional abnormalities were found on retroflexion. Impression:               - Perianal skin tags found on perianal exam.                           - Six 2 to 7 mm, non-bleeding polyps at the splenic                            flexure, in the transverse colon and in the cecum.                            These polyps were not removed due to their small                            size and advanced age of the patient. Potential                            bleeding risk from polypectomies felt to be greater                            than risk of malignant transformation                           - Patent end-to-end colo-colonic anastomosis,                            characterized by healthy appearing mucosa.                           - Moderate diverticulosis in the descending colon,                            in the transverse colon and in the ascending colon.  There was no evidence of diverticular bleeding.                           - Non-bleeding internal hemorrhoids.                           - No specimens collected.                           - There was no blood in the colon and no stigmata                            of hemorrhage or evidence of recent bleeding.                           - The exact etiology of the patient's hematochezia                            is not known, but can be attributed to either                            diverticular bleeding vs. hemorrhoidal bleeding.                            Favor hemorrhoidal bleeding given the prolonged                            bleeding without significant drop in hemoglobin. Moderate Sedation:      Not Applicable - Patient had care per Anesthesia. Recommendation:           - Return patient to hospital ward for possible                            discharge same day.                           - Resume regular diet.                            - Continue present medications.                           - Use hydrocortisone suppository 25 mg 1 per rectum                            once a day for 7 days to reduce hemorrhoidal                            swelling.                           - Use daily fiber supplement, for example Citrucel,                            Fibercon, Konsyl or Metamucil. This will optimize  stool consistency to reduce hemorrhoidal irritation                            and also may reduce risk of diverticular                            complications.                           - Ok to resume aspirin from GI standpoint. Procedure Code(s):        --- Professional ---                           [phone], Colonoscopy, flexible; diagnostic, including                            collection of specimen(s) by brushing or washing,                            when performed (separate procedure) Diagnosis Code(s):        --- Professional ---                           D12.3, Benign neoplasm of transverse colon (hepatic                            flexure or splenic flexure)                           D12.0, Benign neoplasm of cecum                           Z98.0, Intestinal bypass and anastomosis status                           K64.0, First degree hemorrhoids                           K64.4, Residual hemorrhoidal skin tags                           K92.1, Melena (includes Hematochezia)                           K57.30, Diverticulosis of large intestine without                            perforation or abscess without bleeding CPT copyright 2022 [employer]. All rights reserved. The codes documented in this report are preliminary and upon coder review may  be revised to meet current compliance requirements. Perpetua Elling E. Tomasa Rand, MD 04/09/2023 1:56:33 PM This report has been signed electronically. Number of Addenda: 0

## 2023-04-09 NOTE — Interval H&P Note (Signed)
History and Physical Interval Note:  04/09/2023 12:45 PM  [name]  has presented today for surgery, with the diagnosis of lower GI bleed.  The various methods of treatment have been discussed with the patient and family. After consideration of risks, benefits and other options for treatment, the patient has consented to  Procedure(s): COLONOSCOPY WITH PROPOFOL (N/A) as a surgical intervention.  The patient's history has been reviewed, patient examined, no change in status, stable for surgery.  I have reviewed the patient's chart and labs.  Questions were answered to the patient's satisfaction.     Jenel Lucks

## 2023-04-09 NOTE — Transfer of Care (Signed)
Immediate Anesthesia Transfer of Care Note  Patient: [name]  Procedure(s) Performed: COLONOSCOPY WITH PROPOFOL  Patient Location: PACU and Endoscopy Unit  Anesthesia Type:MAC  Level of Consciousness: awake, alert , oriented, and patient cooperative  Airway & Oxygen Therapy: Patient Spontanous Breathing and Patient connected to face mask oxygen  Post-op Assessment: Report given to RN and Post -op Vital signs reviewed and stable  Post vital signs: Reviewed and stable  Last Vitals:  Vitals Value Taken Time  BP 113/63 04/09/23 1351  Temp    Pulse 79 04/09/23 1356  Resp 23 04/09/23 1356  SpO2 100 % 04/09/23 1356  Vitals shown include unvalidated device data.  Last Pain:  Vitals:   04/09/23 1250  TempSrc: Temporal  PainSc: 0-No pain         Complications: No notable events documented.

## 2023-04-09 NOTE — Progress Notes (Addendum)
Patient completed second round of prep yesterday/last night. She is having clear/yellow stools per RN report and is NPO. Continue with procedure today

## 2023-04-09 NOTE — Progress Notes (Signed)
Mobility Specialist - Progress Note   04/09/23 0939  Mobility  Activity Ambulated with assistance in hallway  Level of Assistance Standby assist, set-up cues, supervision of patient - no hands on  Assistive Device Front wheel walker  Distance Ambulated (ft) 90 ft  Range of Motion/Exercises Active  Activity Response Tolerated well  Mobility Referral Yes  $Mobility charge 1 Mobility  Mobility Specialist Start Time (ACUTE ONLY) [id number]  Mobility Specialist Stop Time (ACUTE ONLY) 0935  Mobility Specialist Time Calculation (min) (ACUTE ONLY) 10 min   Pt received in bed and agreed to mobility. Had no issues throughout session, pt returned to chair with all needs met.  Marilynne Halsted Mobility Specialist

## 2023-04-09 NOTE — Anesthesia Postprocedure Evaluation (Signed)
Anesthesia Post Note  Patient: [name]  Procedure(s) Performed: COLONOSCOPY WITH PROPOFOL     Patient location during evaluation: Endoscopy Anesthesia Type: MAC Level of consciousness: awake Pain management: pain level controlled Vital Signs Assessment: post-procedure vital signs reviewed and stable Respiratory status: spontaneous breathing, nonlabored ventilation and respiratory function stable Cardiovascular status: blood pressure returned to baseline and stable Postop Assessment: no apparent nausea or vomiting Anesthetic complications: no   No notable events documented.  Last Vitals:  Vitals:   04/09/23 1410 04/09/23 1428  BP: (!) 157/74 (!) 153/82  Pulse: 79 75  Resp: 20 18  Temp:  36.6 C  SpO2: 100% 100%    Last Pain:  Vitals:   04/09/23 1428  TempSrc: Oral  PainSc:                  Davidmichael Zarazua P Castella Lerner

## 2023-04-09 NOTE — Discharge Summary (Signed)
Physician Discharge Summary  [name]:[id number] DOB: 10/17/1923 DOA: 04/06/2023  PCP: Eloisa Northern, MD  Admit date: 04/06/2023 Discharge date: 04/10/2023  Time spent: 40 minutes  Recommendations for Outpatient Follow-up:  Follow outpatient CBC/CMP  Follow repeat labs outpatient  Follow blood pressure outpatient on losartan, attention to creatinine Hydrocortisone suppository x 7 days, metamucil  Trimethoprim dose adjusted    Discharge Diagnoses:  Principal Problem:   Gastrointestinal hemorrhage   Discharge Condition: stable  Diet recommendation: heart healthy  Filed Weights   04/06/23 0941 04/09/23 1250  Weight: 62.1 kg 62.1 kg    History of present illness:   [name] is [name] 89 y.o. female with Smith Mcnicholas history of AFib, no longer on anticoagulation due to ICH May 2023, MCI, chronic HFpEF, DVT, stage IIIb CKD, PVD on aspirin, GERD, hypothyroidism, melanoma who was admitted 5/23-5/24 for rectal bleeding and returned to the ED from LTC facility on 04/06/2023 for recurrent painless hematochezia. Hgb 11.3g/dl, slightly below baseline. VSS. CT showed diverticulosis, constipation, and 3.2 cm aortic aneurysm, all stable.   Now s/o colonoscopy.  No active bleeding.  Thought due to diverticular vs hemorrhoids.  Hospital Course:  Assessment and Plan:  Painless hematochezia: Recurrent.  - DC'ed aspirin. SCDs for VTE ppx - Colonoscopy with perianal skin tags, size 2-7 mm nonbleeding polyps at the splenic flexure, end to end colo colonic anastomosis, moderate diverticulosiss, nonbleeding internal hemorrhoids, no blood in colon -> unclear etiology, diverticular bleeding vs hemorrhoidal bleeding -> hydrocortisone suppository 25 mg daily x7 days, daily fiber supplement, ok to resume aspirin    AKI on stage IIIb CKD:  - improved at discharge - repeat labs outpatient    Persistent atrial fibrillation: Rate controlled - Not on [facility] due to ICH May 2023.  - Continue diltiazem, nadolol.     History of CVA, ICH with residual deficits:  - PT - Continue risk factor optimization. Statin intolerance noted. - review with PCP/neurology continued aspirin?   Peripheral neuropathy:  - Continue gabapentin 200mg  nightly   HTN:  - resume losartan, diltiazem, nadolol  - needs repeat labs outpatient    Hypothyroidism: Last TSH 1.452.  - Continue synthroid   Goals of care: Noted pt's full code status. Will need to discuss with pt/family.  Will defer to outpatient setting and PCP.   GERD:  - Continue PPI   History of UTI:  - No change to chronic ppx, needs renal dosing (trimethoprim 50mg  daily).   Glaucoma: Quiescent - Continue gtt's.    AAA: 3.2cm - repeat imaging with [place] recommended in 3 years, will defer to PCP, can determine whether appropriate at that time based on functional status/goals of care       Procedures:  Colonoscopy - Perianal skin tags found on perianal exam. - Six 2 to 7 mm, non- bleeding polyps at the splenic flexure, in the transverse colon and in the cecum. These polyps were not removed due to their small size and advanced age of the patient. Potential bleeding risk from polypectomies felt to be greater than risk of malignant transformation - Patent end- to- end colo- colonic anastomosis, characterized by healthy appearing mucosa. - Moderate diverticulosis in the descending colon, in the transverse colon and in the ascending colon. There was no evidence of diverticular bleeding. - Non- bleeding internal hemorrhoids. - No specimens collected. - There was no blood in the colon and no stigmata of hemorrhage or evidence of recent bleeding. - The exact etiology of the patient' s hematochezia  is not known, but can be attributed to either diverticular bleeding vs. hemorrhoidal bleeding. Favor hemorrhoidal bleeding given the prolonged bleeding without significant drop in hemoglobin. Recommendations - Return patient to hospital ward for possible discharge same day. -  Resume regular diet. - Continue present medications. - Use hydrocortisone suppository 25 mg 1 per rectum once Hiilani Jetter day for 7 days to reduce hemorrhoidal swelling. - Use daily fiber supplement, for example Citrucel, Fibercon, Konsyl or Metamucil. This will optimize stool consistency to reduce hemorrhoidal irritation and also may reduce risk of diverticular complications. - Ok to resume aspirin from GI standpoint. Consultations: GI  Discharge Exam: Vitals:   04/09/23 1929 04/10/23 0507  BP: (!) 142/87 (!) 144/72  Pulse: 85 (!) 50  Resp: 16 16  Temp: 97.8 F (36.6 C) 98.6 F (37 C)  SpO2: 100% 98%   No new complaints Eager to discharge  General: No acute distress. Cardiovascular: Heart sounds show Mecca Guitron regular rate, and rhythm.  Lungs: Clear to auscultation bilaterally Abdomen: Soft, nontender, nondistended  Neurological: Alert and oriented 3. Moves all extremities 4 with equal strength. Cranial nerves II through XII grossly intact. Extremities: No clubbing or cyanosis. No edema.   Discharge Instructions   Discharge Instructions     Call MD for:  difficulty breathing, headache or visual disturbances   Complete by: As directed    Call MD for:  extreme fatigue   Complete by: As directed    Call MD for:  hives   Complete by: As directed    Call MD for:  persistant dizziness or light-headedness   Complete by: As directed    Call MD for:  persistant nausea and vomiting   Complete by: As directed    Call MD for:  redness, tenderness, or signs of infection (pain, swelling, redness, odor or green/yellow discharge around incision site)   Complete by: As directed    Call MD for:  severe uncontrolled pain   Complete by: As directed    Call MD for:  temperature >100.4   Complete by: As directed    Diet - low sodium heart healthy   Complete by: As directed    Discharge instructions   Complete by: As directed    You were seen for [name] GI bleed.   You had [name] colonoscopy on 5/29 which showed  no active bleeding.  It showed moderate diverticulosis and nonbleeding internal hemorrhoids.  The exact cause of your bleeding is unclear, but GI suspects it maybe diverticular vs hemorrhoidal bleeding.  They recommend Jeffree Cazeau hydrocortisone suppository 25 mg once Neeraj Housand day for 7 days to reduce hemorrhoidal swelling as well as Javohn Basey fiber supplement (like metamucil).    There were polyps noted during the colonoscopy as well.    We adjusted your trimethroprim dose based on your kidney function.  Follow with your outpatient prescriber regarding this change.   Your CT scan showed an abdominal aortic aneurysm.  You should have another imaging study in 3 years to ensure this is stable if deemed appropriate by your PCP.   You can resume aspirin.  I would discuss with your PCP or neurologist regarding whether this is something they think should be continued long term.  We've resumed your blood pressure medicines.  You should have repeat labs within Jaaliyah Lucatero week or so to follow your kidney function.  Return for new, recurrent, or worsening symptoms.  Please ask your PCP to request records from this hospitalization so they know what was done and what the  next steps will be.   Increase activity slowly   Complete by: As directed       Allergies as of 04/10/2023       Reactions   Keflex [cephalexin] Nausea Only, Other (See Comments)   Pt ended up in ER w/ CHEST PAIN.  Patient experienced chest pain again with re-challenge of cephalexin in May 2023.   Codeine Other (See Comments)   "Nightmares, imagined things"        Medication List     STOP taking these medications    PROSTAT PO       TAKE these medications    acetaminophen 325 MG tablet Commonly known as: TYLENOL Take 650 mg by mouth every 6 (six) hours as needed for mild pain or moderate pain.   Anusol-HC 2.5 % rectal cream Generic drug: hydrocortisone Place 1 Application rectally every 6 (six) hours as needed for hemorrhoids or anal itching.    aspirin EC 81 MG tablet Take 1 tablet (81 mg total) by mouth daily. Swallow whole.   Biofreeze 4 % Gel Generic drug: Menthol (Topical Analgesic) Apply 1 Application topically daily as needed (pain).   brimonidine 0.2 % ophthalmic solution Commonly known as: ALPHAGAN Place 1 drop into the right eye 2 (two) times daily.   diltiazem 120 MG 24 hr capsule Commonly known as: CARDIZEM CD Take 1 capsule (120 mg total) by mouth every evening.   DORZOLAMIDE HCL-TIMOLOL MAL OP Place 1 drop into the right eye in the morning and at bedtime. Cosopt 22.3 mg/6.8 mg/ml   gabapentin 100 MG capsule Commonly known as: NEURONTIN Take 2 capsules (200 mg total) by mouth at bedtime.   hydrocortisone 25 MG suppository Commonly known as: ANUSOL-HC Place 1 suppository (25 mg total) rectally at bedtime. What changed: Another medication with the same name was added. Make sure you understand how and when to take each.   hydrocortisone 25 MG suppository Commonly known as: ANUSOL-HC Place 1 suppository (25 mg total) rectally daily for 7 days. What changed: You were already taking Detrice Cales medication with the same name, and this prescription was added. Make sure you understand how and when to take each.   hydrocortisone cream 1 % Apply 1 Application topically 3 (three) times daily as needed for itching.   hydroxypropyl methylcellulose / hypromellose 2.5 % ophthalmic solution Commonly known as: ISOPTO TEARS / GONIOVISC Place 1 drop into the left eye 4 (four) times daily.   levothyroxine 50 MCG tablet Commonly known as: SYNTHROID Take 1 tablet (50 mcg total) by mouth daily before breakfast.   loperamide 2 MG tablet Commonly known as: IMODIUM Katelyn Kohlmeyer-D Take 2 mg by mouth 4 (four) times daily as needed for diarrhea or loose stools.   losartan 50 MG tablet Commonly known as: COZAAR Take 50 mg by mouth daily.   melatonin 3 MG Tabs tablet Take 6 mg by mouth at bedtime.   MIRALAX PO Take 1 packet by mouth  daily.   nadolol 80 MG tablet Commonly known as: CORGARD Take 1 tablet (80 mg total) by mouth daily.   nitroGLYCERIN 0.4 MG SL tablet Commonly known as: NITROSTAT Place 1 tablet (0.4 mg total) under the tongue every 5 (five) minutes as needed for chest pain.   ondansetron 4 MG tablet Commonly known as: ZOFRAN Take 4 mg by mouth every 6 (six) hours as needed for nausea or vomiting.   pantoprazole 40 MG tablet Commonly known as: PROTONIX Take 1 tablet (40 mg total) by mouth daily.  polyvinyl alcohol 1.4 % ophthalmic solution Commonly known as: LIQUIFILM TEARS Place 1 drop into the right eye as needed for dry eyes.   predniSONE 1 MG tablet Commonly known as: DELTASONE Take 2 tablets (2 mg total) by mouth daily with lunch.   PRESERVISION AREDS 2 PO Take 1 tablet by mouth daily with lunch.   trimethoprim 100 MG tablet Commonly known as: TRIMPEX Take 0.5 tablets (50 mg total) by mouth in the morning. What changed: how much to take   witch hazel-glycerin pad Commonly known as: TUCKS Apply 1 Application topically as needed for itching.       Allergies  Allergen Reactions   Keflex [Cephalexin] Nausea Only and Other (See Comments)    Pt ended up in ER w/ CHEST PAIN.  Patient experienced chest pain again with re-challenge of cephalexin in May 2023.   Codeine Other (See Comments)    "Nightmares, imagined things"      The results of significant diagnostics from this hospitalization (including imaging, microbiology, ancillary and laboratory) are listed below for reference.    Significant Diagnostic Studies: CT ABDOMEN PELVIS WO CONTRAST  Result Date: 04/02/2023 CLINICAL DATA:  Lower GI bleed. EXAM: CT ABDOMEN AND PELVIS WITHOUT CONTRAST TECHNIQUE: Multidetector CT imaging of the abdomen and pelvis was performed following the standard protocol without IV contrast. RADIATION DOSE REDUCTION: This exam was performed according to the departmental dose-optimization program which  includes automated exposure control, adjustment of the mA and/or kV according to patient size and/or use of iterative reconstruction technique. COMPARISON:  CT abdomen pelvis dated 07/17/2021. FINDINGS: Evaluation of this exam is limited in the absence of intravenous contrast. Lower chest: Left lung base linear atelectasis/scarring. The visualized lung bases are otherwise clear. There is coronary vascular calcification and calcification of the mitral annulus. No intra-abdominal free air or free fluid. Hepatobiliary: Similar appearance of Natassja Ollis fluid density which shaped area in the right lobe of the liver. The liver is otherwise unremarkable. No calcified gallstone or pericholecystic fluid. Pancreas: No active inflammatory changes. Spleen: Normal in size without focal abnormality. Adrenals/Urinary Tract: The adrenal glands unremarkable. Stable 3 cm right renal upper pole cyst. No imaging follow-up. There is no hydronephrosis or nephrolithiasis on either side. The visualized ureters and urinary bladder appear unremarkable. Stomach/Bowel: Postsurgical changes of partial sigmoid resection. There is moderate stool throughout the colon. There is scattered colonic diverticulosis without active inflammatory changes. There is no bowel obstruction or active inflammation. Appendectomy. Vascular/Lymphatic: Advanced aortoiliac atherosclerotic disease. The aorta is tortuous. There is Redding Cloe 3.2 cm infrarenal abdominal aortic aneurysm. The IVC is unremarkable. No portal venous gas. There is no adenopathy. Reproductive: Hysterectomy.  No adnexal masses. Other: Midline vertical anterior pelvic wall incisional scar. Musculoskeletal: Total left hip arthroplasty. Osteopenia with degenerative changes of the spine. Old T12 compression fracture with anterior wedging and vertebroplasty. No acute osseous pathology. IMPRESSION: 1. No acute intra-abdominal or pelvic pathology. 2. Colonic diverticulosis. 3. Constipation.  No bowel obstruction. 4. Eliabeth Shoff  3.2 cm infrarenal abdominal aortic aneurysm. Recommend follow-up ultrasound every 3 years. 5.  Aortic Atherosclerosis (ICD10-I70.0). Electronically Signed   By: Elgie Collard M.D.   On: 04/02/2023 22:43    Microbiology: No results found for this or any previous visit (from the past 240 hour(s)).   Labs: Basic Metabolic Panel: Recent Labs  Lab 04/04/23 0534 04/06/23 0941 04/07/23 0448 04/08/23 0534  NA 139 139 141 137  K 4.1 4.2 4.6 3.9  CL 111 109 113* 112*  CO2 22 23 22  21*  GLUCOSE 99 102* 102* 99  BUN 17 26* 23 16  CREATININE 1.19* 1.68* 1.44* 1.12*  CALCIUM 8.9 9.0 8.8* 8.7*   Liver Function Tests: Recent Labs  Lab 04/06/23 0941  AST 13*  ALT 11  ALKPHOS 96  BILITOT 0.8  PROT 6.3*  ALBUMIN 3.3*   Recent Labs  Lab 04/06/23 0941  LIPASE 36   No results for input(s): "AMMONIA" in the last 168 hours. CBC: Recent Labs  Lab 04/04/23 0534 04/06/23 0941 04/07/23 0448 04/08/23 0534  WBC 9.3 8.8 9.4 8.2  NEUTROABS 5.1 5.1  --   --   HGB 11.5* 11.3* 10.3* 10.6*  HCT 36.8 35.6* 32.6* 33.4*  MCV 90.0 90.6 92.1 90.8  PLT 253 270 241 243   Cardiac Enzymes: No results for input(s): "CKTOTAL", "CKMB", "CKMBINDEX", "TROPONINI" in the last 168 hours. BNP: BNP (last 3 results) Recent Labs    04/25/22 1446 04/03/23 0600  BNP 416.0* 386.1*    ProBNP (last 3 results) No results for input(s): "PROBNP" in the last 8760 hours.  CBG: No results for input(s): "GLUCAP" in the last 168 hours.     Signed:  Lacretia Nicks MD.  [facility] 04/10/2023, 8:52 AM

## 2023-04-09 NOTE — Plan of Care (Signed)

## 2023-04-10 DIAGNOSIS — K922 Gastrointestinal hemorrhage, unspecified: Secondary | ICD-10-CM | POA: Diagnosis not present

## 2023-04-10 NOTE — Progress Notes (Signed)
Report called and given to Inetta Fermo, Charity fundraiser at [facility]. Patient stable at discharge with [facility] transporting.

## 2023-04-10 NOTE — Progress Notes (Signed)
No complaints this morning.  Stable and eager for discharge. No additional bleeding noted  Vitals:   04/09/23 1929 04/10/23 0507  BP: (!) 142/87 (!) 144/72  Pulse: 85 (!) 50  Resp: 16 16  Temp: 97.8 F (36.6 C) 98.6 F (37 C)  SpO2: 100% 98%   NAD RRR Unlabored CN 2-12 grossly intact, moving all extremities Normal mood/affect  Stable for discharge.  See discharge summary from 5/29.  Son at bedside.  Discussed d/c plan with both patient and son.

## 2023-04-10 NOTE — Consult Note (Signed)
   [facility] CM Inpatient Consult   [facility]) Accountable Care Organization (ACO) [facility] Liaison Note  04/10/2023  [name] [birth year] [id number]  Location: [facility] RN Hospital Liaison screened the patient remotely at [facility].  Insurance: [name] is a 89 y.o. female who is a Primary Care Patient of Eloisa Northern, MD. The patient was screened for 7 day readmission hospitalization with noted medium risk score for unplanned readmission risk with 2 inpatient hospitalizations in 6 months.  The patient was assessed for potential [facility]) Care Management service needs for post hospital transition for care coordination. Review of patient's electronic medical record reveals patient is a long term care resident at [facility] per inpatient TOC LCSW progress TOC note.   Plan:  Referral request for community care coordination: no current needs for [facility] Coordination as the patient is LTC at [facility] and care coordination needs are to be met at the level of care.   [facility] Care Management/Population Health does not replace or interfere with any arrangements made by the Inpatient Transition of Care team.   For questions contact:   [name] Shanks, RN BSN CCM [facility]  [phone] business mobile phone Toll free office [phone]  *Concierge Line  [phone] Fax number: [phone] [email or web address]

## 2023-04-10 NOTE — TOC Transition Note (Signed)
Transition of Care [facility]) - CM/SW Discharge Note   Patient Details  Name: [name] MRN: [id number] Date of Birth: [birth year]  Transition [facility] Care [facility]) CM/SW Contact:  Otelia Santee, LCSW Phone Number: 04/10/2023, 9:23 AM   Clinical Narrative:    Pt is to return to [facility] L[facility]. Pt will be going to room 105. RN to call report to [phone]. Pt will be transported to facility via [facility]. [facility] called at 9:22am for transportation. DC packet made and placed at RN station.    Final next level of care: Long Term Nursing Home Barriers to Discharge: Barriers Resolved   Patient Goals and CMS Choice CMS Medicare.gov Compare Post Acute Care list provided to:: Patient Choice offered to / list presented to : Patient  Discharge Placement     Existing PASRR number confirmed : 04/07/23          Patient chooses bed at: [facility] Patient to be transferred to facility by: [facility] Name of family member notified: Pt Patient and family notified of of transfer: 04/10/23  Discharge Plan and Services Additional resources added to the After Visit Summary for   In-house Referral: Clinical Social Work Discharge Planning Services: NA Post Acute Care Choice: Skilled Nursing Facility, Nursing Home          DME Arranged: N/A DME Agency: NA                  Social Determinants of Health (SDOH) Interventions SDOH Screenings   Food Insecurity: No Food Insecurity (04/09/2023)  Housing: Low Risk  (04/09/2023)  Transportation Needs: No Transportation Needs (04/09/2023)  Utilities: Not At Risk (04/09/2023)  Alcohol Screen: Low Risk  (12/27/2021)  Depression (PHQ2-9): Low Risk  (04/02/2023)  Financial Resource Strain: Low Risk  (12/27/2021)  Physical Activity: Insufficiently Active (12/27/2021)  Social Connections: Moderately Integrated (12/27/2021)  Stress: No Stress Concern Present (12/27/2021)  Tobacco Use: Low Risk  (04/09/2023)     Readmission Risk Interventions    04/04/2023   11:25 AM   Readmission Risk Prevention Plan  Transportation Screening Complete  PCP or Specialist Appt within 5-7 Days Complete  Home Care Screening Complete  Medication Review (RN CM) Complete

## 2023-04-13 ENCOUNTER — Encounter (HOSPITAL_COMMUNITY): Payer: Self-pay | Admitting: Gastroenterology

## 2023-04-15 DIAGNOSIS — K921 Melena: Secondary | ICD-10-CM | POA: Diagnosis not present

## 2023-04-15 DIAGNOSIS — I729 Aneurysm of unspecified site: Secondary | ICD-10-CM | POA: Diagnosis not present

## 2023-04-15 DIAGNOSIS — I10 Essential (primary) hypertension: Secondary | ICD-10-CM | POA: Diagnosis not present

## 2023-04-15 DIAGNOSIS — K922 Gastrointestinal hemorrhage, unspecified: Secondary | ICD-10-CM | POA: Diagnosis not present

## 2023-04-15 DIAGNOSIS — I5030 Unspecified diastolic (congestive) heart failure: Secondary | ICD-10-CM | POA: Diagnosis not present

## 2023-04-15 DIAGNOSIS — D649 Anemia, unspecified: Secondary | ICD-10-CM | POA: Diagnosis not present

## 2023-04-16 DIAGNOSIS — H34811 Central retinal vein occlusion, right eye, with macular edema: Secondary | ICD-10-CM | POA: Diagnosis not present

## 2023-04-22 DIAGNOSIS — R279 Unspecified lack of coordination: Secondary | ICD-10-CM | POA: Diagnosis not present

## 2023-04-22 DIAGNOSIS — R482 Apraxia: Secondary | ICD-10-CM | POA: Diagnosis not present

## 2023-04-22 DIAGNOSIS — R4701 Aphasia: Secondary | ICD-10-CM | POA: Diagnosis not present

## 2023-04-22 DIAGNOSIS — M6281 Muscle weakness (generalized): Secondary | ICD-10-CM | POA: Diagnosis not present

## 2023-04-22 DIAGNOSIS — I619 Nontraumatic intracerebral hemorrhage, unspecified: Secondary | ICD-10-CM | POA: Diagnosis not present

## 2023-04-22 DIAGNOSIS — R2689 Other abnormalities of gait and mobility: Secondary | ICD-10-CM | POA: Diagnosis not present

## 2023-04-23 DIAGNOSIS — I10 Essential (primary) hypertension: Secondary | ICD-10-CM | POA: Diagnosis not present

## 2023-04-24 DIAGNOSIS — R482 Apraxia: Secondary | ICD-10-CM | POA: Diagnosis not present

## 2023-04-24 DIAGNOSIS — R279 Unspecified lack of coordination: Secondary | ICD-10-CM | POA: Diagnosis not present

## 2023-04-24 DIAGNOSIS — R2689 Other abnormalities of gait and mobility: Secondary | ICD-10-CM | POA: Diagnosis not present

## 2023-04-24 DIAGNOSIS — R4701 Aphasia: Secondary | ICD-10-CM | POA: Diagnosis not present

## 2023-04-24 DIAGNOSIS — M6281 Muscle weakness (generalized): Secondary | ICD-10-CM | POA: Diagnosis not present

## 2023-04-24 DIAGNOSIS — I619 Nontraumatic intracerebral hemorrhage, unspecified: Secondary | ICD-10-CM | POA: Diagnosis not present

## 2023-04-25 DIAGNOSIS — M6281 Muscle weakness (generalized): Secondary | ICD-10-CM | POA: Diagnosis not present

## 2023-04-25 DIAGNOSIS — R2689 Other abnormalities of gait and mobility: Secondary | ICD-10-CM | POA: Diagnosis not present

## 2023-04-25 DIAGNOSIS — R4701 Aphasia: Secondary | ICD-10-CM | POA: Diagnosis not present

## 2023-04-25 DIAGNOSIS — R279 Unspecified lack of coordination: Secondary | ICD-10-CM | POA: Diagnosis not present

## 2023-04-25 DIAGNOSIS — I619 Nontraumatic intracerebral hemorrhage, unspecified: Secondary | ICD-10-CM | POA: Diagnosis not present

## 2023-04-25 DIAGNOSIS — R482 Apraxia: Secondary | ICD-10-CM | POA: Diagnosis not present

## 2023-04-28 DIAGNOSIS — M6281 Muscle weakness (generalized): Secondary | ICD-10-CM | POA: Diagnosis not present

## 2023-04-28 DIAGNOSIS — I619 Nontraumatic intracerebral hemorrhage, unspecified: Secondary | ICD-10-CM | POA: Diagnosis not present

## 2023-04-28 DIAGNOSIS — R279 Unspecified lack of coordination: Secondary | ICD-10-CM | POA: Diagnosis not present

## 2023-04-28 DIAGNOSIS — R482 Apraxia: Secondary | ICD-10-CM | POA: Diagnosis not present

## 2023-04-28 DIAGNOSIS — R4701 Aphasia: Secondary | ICD-10-CM | POA: Diagnosis not present

## 2023-04-28 DIAGNOSIS — R2689 Other abnormalities of gait and mobility: Secondary | ICD-10-CM | POA: Diagnosis not present

## 2023-04-30 DIAGNOSIS — R279 Unspecified lack of coordination: Secondary | ICD-10-CM | POA: Diagnosis not present

## 2023-04-30 DIAGNOSIS — I619 Nontraumatic intracerebral hemorrhage, unspecified: Secondary | ICD-10-CM | POA: Diagnosis not present

## 2023-04-30 DIAGNOSIS — R482 Apraxia: Secondary | ICD-10-CM | POA: Diagnosis not present

## 2023-04-30 DIAGNOSIS — M6281 Muscle weakness (generalized): Secondary | ICD-10-CM | POA: Diagnosis not present

## 2023-04-30 DIAGNOSIS — G2581 Restless legs syndrome: Secondary | ICD-10-CM | POA: Diagnosis not present

## 2023-04-30 DIAGNOSIS — R4701 Aphasia: Secondary | ICD-10-CM | POA: Diagnosis not present

## 2023-04-30 DIAGNOSIS — R2689 Other abnormalities of gait and mobility: Secondary | ICD-10-CM | POA: Diagnosis not present

## 2023-04-30 DIAGNOSIS — G4700 Insomnia, unspecified: Secondary | ICD-10-CM | POA: Diagnosis not present

## 2023-05-02 DIAGNOSIS — R279 Unspecified lack of coordination: Secondary | ICD-10-CM | POA: Diagnosis not present

## 2023-05-02 DIAGNOSIS — M6281 Muscle weakness (generalized): Secondary | ICD-10-CM | POA: Diagnosis not present

## 2023-05-02 DIAGNOSIS — R2689 Other abnormalities of gait and mobility: Secondary | ICD-10-CM | POA: Diagnosis not present

## 2023-05-02 DIAGNOSIS — R482 Apraxia: Secondary | ICD-10-CM | POA: Diagnosis not present

## 2023-05-02 DIAGNOSIS — I619 Nontraumatic intracerebral hemorrhage, unspecified: Secondary | ICD-10-CM | POA: Diagnosis not present

## 2023-05-02 DIAGNOSIS — R4701 Aphasia: Secondary | ICD-10-CM | POA: Diagnosis not present

## 2023-05-05 DIAGNOSIS — M6281 Muscle weakness (generalized): Secondary | ICD-10-CM | POA: Diagnosis not present

## 2023-05-05 DIAGNOSIS — G2581 Restless legs syndrome: Secondary | ICD-10-CM | POA: Diagnosis not present

## 2023-05-05 DIAGNOSIS — I619 Nontraumatic intracerebral hemorrhage, unspecified: Secondary | ICD-10-CM | POA: Diagnosis not present

## 2023-05-05 DIAGNOSIS — R2689 Other abnormalities of gait and mobility: Secondary | ICD-10-CM | POA: Diagnosis not present

## 2023-05-05 DIAGNOSIS — R4701 Aphasia: Secondary | ICD-10-CM | POA: Diagnosis not present

## 2023-05-05 DIAGNOSIS — R279 Unspecified lack of coordination: Secondary | ICD-10-CM | POA: Diagnosis not present

## 2023-05-05 DIAGNOSIS — R482 Apraxia: Secondary | ICD-10-CM | POA: Diagnosis not present

## 2023-05-05 DIAGNOSIS — Z7689 Persons encountering health services in other specified circumstances: Secondary | ICD-10-CM | POA: Diagnosis not present

## 2023-05-05 DIAGNOSIS — M25561 Pain in right knee: Secondary | ICD-10-CM | POA: Diagnosis not present

## 2023-05-06 DIAGNOSIS — R2689 Other abnormalities of gait and mobility: Secondary | ICD-10-CM | POA: Diagnosis not present

## 2023-05-06 DIAGNOSIS — M6281 Muscle weakness (generalized): Secondary | ICD-10-CM | POA: Diagnosis not present

## 2023-05-06 DIAGNOSIS — R4701 Aphasia: Secondary | ICD-10-CM | POA: Diagnosis not present

## 2023-05-06 DIAGNOSIS — I619 Nontraumatic intracerebral hemorrhage, unspecified: Secondary | ICD-10-CM | POA: Diagnosis not present

## 2023-05-06 DIAGNOSIS — R482 Apraxia: Secondary | ICD-10-CM | POA: Diagnosis not present

## 2023-05-06 DIAGNOSIS — R279 Unspecified lack of coordination: Secondary | ICD-10-CM | POA: Diagnosis not present

## 2023-05-07 DIAGNOSIS — R279 Unspecified lack of coordination: Secondary | ICD-10-CM | POA: Diagnosis not present

## 2023-05-07 DIAGNOSIS — R482 Apraxia: Secondary | ICD-10-CM | POA: Diagnosis not present

## 2023-05-07 DIAGNOSIS — R2689 Other abnormalities of gait and mobility: Secondary | ICD-10-CM | POA: Diagnosis not present

## 2023-05-07 DIAGNOSIS — R4701 Aphasia: Secondary | ICD-10-CM | POA: Diagnosis not present

## 2023-05-07 DIAGNOSIS — M6281 Muscle weakness (generalized): Secondary | ICD-10-CM | POA: Diagnosis not present

## 2023-05-07 DIAGNOSIS — I619 Nontraumatic intracerebral hemorrhage, unspecified: Secondary | ICD-10-CM | POA: Diagnosis not present

## 2023-05-08 DIAGNOSIS — N390 Urinary tract infection, site not specified: Secondary | ICD-10-CM | POA: Diagnosis not present

## 2023-05-08 DIAGNOSIS — E119 Type 2 diabetes mellitus without complications: Secondary | ICD-10-CM | POA: Diagnosis not present

## 2023-05-08 DIAGNOSIS — E559 Vitamin D deficiency, unspecified: Secondary | ICD-10-CM | POA: Diagnosis not present

## 2023-05-09 DIAGNOSIS — R4701 Aphasia: Secondary | ICD-10-CM | POA: Diagnosis not present

## 2023-05-09 DIAGNOSIS — R482 Apraxia: Secondary | ICD-10-CM | POA: Diagnosis not present

## 2023-05-09 DIAGNOSIS — R279 Unspecified lack of coordination: Secondary | ICD-10-CM | POA: Diagnosis not present

## 2023-05-09 DIAGNOSIS — R2689 Other abnormalities of gait and mobility: Secondary | ICD-10-CM | POA: Diagnosis not present

## 2023-05-09 DIAGNOSIS — M6281 Muscle weakness (generalized): Secondary | ICD-10-CM | POA: Diagnosis not present

## 2023-05-09 DIAGNOSIS — I619 Nontraumatic intracerebral hemorrhage, unspecified: Secondary | ICD-10-CM | POA: Diagnosis not present

## 2023-05-12 DIAGNOSIS — R2689 Other abnormalities of gait and mobility: Secondary | ICD-10-CM | POA: Diagnosis not present

## 2023-05-12 DIAGNOSIS — I619 Nontraumatic intracerebral hemorrhage, unspecified: Secondary | ICD-10-CM | POA: Diagnosis not present

## 2023-05-12 DIAGNOSIS — M1711 Unilateral primary osteoarthritis, right knee: Secondary | ICD-10-CM | POA: Diagnosis not present

## 2023-05-12 DIAGNOSIS — M6281 Muscle weakness (generalized): Secondary | ICD-10-CM | POA: Diagnosis not present

## 2023-05-12 DIAGNOSIS — N390 Urinary tract infection, site not specified: Secondary | ICD-10-CM | POA: Diagnosis not present

## 2023-05-12 DIAGNOSIS — K921 Melena: Secondary | ICD-10-CM | POA: Diagnosis not present

## 2023-05-13 DIAGNOSIS — D649 Anemia, unspecified: Secondary | ICD-10-CM | POA: Diagnosis not present

## 2023-05-13 DIAGNOSIS — Z7689 Persons encountering health services in other specified circumstances: Secondary | ICD-10-CM | POA: Diagnosis not present

## 2023-05-13 DIAGNOSIS — R2689 Other abnormalities of gait and mobility: Secondary | ICD-10-CM | POA: Diagnosis not present

## 2023-05-13 DIAGNOSIS — M6281 Muscle weakness (generalized): Secondary | ICD-10-CM | POA: Diagnosis not present

## 2023-05-13 DIAGNOSIS — I619 Nontraumatic intracerebral hemorrhage, unspecified: Secondary | ICD-10-CM | POA: Diagnosis not present

## 2023-05-13 DIAGNOSIS — K921 Melena: Secondary | ICD-10-CM | POA: Diagnosis not present

## 2023-05-13 DIAGNOSIS — N1832 Chronic kidney disease, stage 3b: Secondary | ICD-10-CM | POA: Diagnosis not present

## 2023-05-13 DIAGNOSIS — E559 Vitamin D deficiency, unspecified: Secondary | ICD-10-CM | POA: Diagnosis not present

## 2023-05-13 DIAGNOSIS — I10 Essential (primary) hypertension: Secondary | ICD-10-CM | POA: Diagnosis not present

## 2023-05-13 DIAGNOSIS — Z87440 Personal history of urinary (tract) infections: Secondary | ICD-10-CM | POA: Diagnosis not present

## 2023-05-13 DIAGNOSIS — I5030 Unspecified diastolic (congestive) heart failure: Secondary | ICD-10-CM | POA: Diagnosis not present

## 2023-05-14 DIAGNOSIS — I619 Nontraumatic intracerebral hemorrhage, unspecified: Secondary | ICD-10-CM | POA: Diagnosis not present

## 2023-05-14 DIAGNOSIS — R2689 Other abnormalities of gait and mobility: Secondary | ICD-10-CM | POA: Diagnosis not present

## 2023-05-14 DIAGNOSIS — M6281 Muscle weakness (generalized): Secondary | ICD-10-CM | POA: Diagnosis not present

## 2023-05-14 DIAGNOSIS — K921 Melena: Secondary | ICD-10-CM | POA: Diagnosis not present

## 2023-05-16 DIAGNOSIS — M6281 Muscle weakness (generalized): Secondary | ICD-10-CM | POA: Diagnosis not present

## 2023-05-16 DIAGNOSIS — I619 Nontraumatic intracerebral hemorrhage, unspecified: Secondary | ICD-10-CM | POA: Diagnosis not present

## 2023-05-16 DIAGNOSIS — R2689 Other abnormalities of gait and mobility: Secondary | ICD-10-CM | POA: Diagnosis not present

## 2023-05-16 DIAGNOSIS — K921 Melena: Secondary | ICD-10-CM | POA: Diagnosis not present

## 2023-05-19 DIAGNOSIS — I619 Nontraumatic intracerebral hemorrhage, unspecified: Secondary | ICD-10-CM | POA: Diagnosis not present

## 2023-05-19 DIAGNOSIS — K921 Melena: Secondary | ICD-10-CM | POA: Diagnosis not present

## 2023-05-19 DIAGNOSIS — G8911 Acute pain due to trauma: Secondary | ICD-10-CM | POA: Diagnosis not present

## 2023-05-19 DIAGNOSIS — R2689 Other abnormalities of gait and mobility: Secondary | ICD-10-CM | POA: Diagnosis not present

## 2023-05-19 DIAGNOSIS — M6281 Muscle weakness (generalized): Secondary | ICD-10-CM | POA: Diagnosis not present

## 2023-05-20 DIAGNOSIS — M6281 Muscle weakness (generalized): Secondary | ICD-10-CM | POA: Diagnosis not present

## 2023-05-20 DIAGNOSIS — K921 Melena: Secondary | ICD-10-CM | POA: Diagnosis not present

## 2023-05-20 DIAGNOSIS — Z7689 Persons encountering health services in other specified circumstances: Secondary | ICD-10-CM | POA: Diagnosis not present

## 2023-05-20 DIAGNOSIS — R2689 Other abnormalities of gait and mobility: Secondary | ICD-10-CM | POA: Diagnosis not present

## 2023-05-20 DIAGNOSIS — I619 Nontraumatic intracerebral hemorrhage, unspecified: Secondary | ICD-10-CM | POA: Diagnosis not present

## 2023-05-21 DIAGNOSIS — M6281 Muscle weakness (generalized): Secondary | ICD-10-CM | POA: Diagnosis not present

## 2023-05-21 DIAGNOSIS — R2689 Other abnormalities of gait and mobility: Secondary | ICD-10-CM | POA: Diagnosis not present

## 2023-05-21 DIAGNOSIS — I619 Nontraumatic intracerebral hemorrhage, unspecified: Secondary | ICD-10-CM | POA: Diagnosis not present

## 2023-05-21 DIAGNOSIS — K921 Melena: Secondary | ICD-10-CM | POA: Diagnosis not present

## 2023-05-22 DIAGNOSIS — K921 Melena: Secondary | ICD-10-CM | POA: Diagnosis not present

## 2023-05-22 DIAGNOSIS — R2689 Other abnormalities of gait and mobility: Secondary | ICD-10-CM | POA: Diagnosis not present

## 2023-05-22 DIAGNOSIS — I619 Nontraumatic intracerebral hemorrhage, unspecified: Secondary | ICD-10-CM | POA: Diagnosis not present

## 2023-05-22 DIAGNOSIS — M6281 Muscle weakness (generalized): Secondary | ICD-10-CM | POA: Diagnosis not present

## 2023-05-23 DIAGNOSIS — R2689 Other abnormalities of gait and mobility: Secondary | ICD-10-CM | POA: Diagnosis not present

## 2023-05-23 DIAGNOSIS — M6281 Muscle weakness (generalized): Secondary | ICD-10-CM | POA: Diagnosis not present

## 2023-05-23 DIAGNOSIS — I619 Nontraumatic intracerebral hemorrhage, unspecified: Secondary | ICD-10-CM | POA: Diagnosis not present

## 2023-05-23 DIAGNOSIS — K921 Melena: Secondary | ICD-10-CM | POA: Diagnosis not present

## 2023-05-26 DIAGNOSIS — I619 Nontraumatic intracerebral hemorrhage, unspecified: Secondary | ICD-10-CM | POA: Diagnosis not present

## 2023-05-26 DIAGNOSIS — R2689 Other abnormalities of gait and mobility: Secondary | ICD-10-CM | POA: Diagnosis not present

## 2023-05-26 DIAGNOSIS — K921 Melena: Secondary | ICD-10-CM | POA: Diagnosis not present

## 2023-05-26 DIAGNOSIS — M6281 Muscle weakness (generalized): Secondary | ICD-10-CM | POA: Diagnosis not present

## 2023-05-28 DIAGNOSIS — R2689 Other abnormalities of gait and mobility: Secondary | ICD-10-CM | POA: Diagnosis not present

## 2023-05-28 DIAGNOSIS — I619 Nontraumatic intracerebral hemorrhage, unspecified: Secondary | ICD-10-CM | POA: Diagnosis not present

## 2023-05-28 DIAGNOSIS — K921 Melena: Secondary | ICD-10-CM | POA: Diagnosis not present

## 2023-05-28 DIAGNOSIS — M6281 Muscle weakness (generalized): Secondary | ICD-10-CM | POA: Diagnosis not present

## 2023-05-30 DIAGNOSIS — K921 Melena: Secondary | ICD-10-CM | POA: Diagnosis not present

## 2023-05-30 DIAGNOSIS — R2689 Other abnormalities of gait and mobility: Secondary | ICD-10-CM | POA: Diagnosis not present

## 2023-05-30 DIAGNOSIS — M6281 Muscle weakness (generalized): Secondary | ICD-10-CM | POA: Diagnosis not present

## 2023-05-30 DIAGNOSIS — I619 Nontraumatic intracerebral hemorrhage, unspecified: Secondary | ICD-10-CM | POA: Diagnosis not present

## 2023-06-02 DIAGNOSIS — R2689 Other abnormalities of gait and mobility: Secondary | ICD-10-CM | POA: Diagnosis not present

## 2023-06-02 DIAGNOSIS — I619 Nontraumatic intracerebral hemorrhage, unspecified: Secondary | ICD-10-CM | POA: Diagnosis not present

## 2023-06-02 DIAGNOSIS — M6281 Muscle weakness (generalized): Secondary | ICD-10-CM | POA: Diagnosis not present

## 2023-06-02 DIAGNOSIS — K921 Melena: Secondary | ICD-10-CM | POA: Diagnosis not present

## 2023-06-03 DIAGNOSIS — R2689 Other abnormalities of gait and mobility: Secondary | ICD-10-CM | POA: Diagnosis not present

## 2023-06-03 DIAGNOSIS — I619 Nontraumatic intracerebral hemorrhage, unspecified: Secondary | ICD-10-CM | POA: Diagnosis not present

## 2023-06-03 DIAGNOSIS — K921 Melena: Secondary | ICD-10-CM | POA: Diagnosis not present

## 2023-06-03 DIAGNOSIS — M6281 Muscle weakness (generalized): Secondary | ICD-10-CM | POA: Diagnosis not present

## 2023-06-04 DIAGNOSIS — E039 Hypothyroidism, unspecified: Secondary | ICD-10-CM | POA: Diagnosis not present

## 2023-06-04 DIAGNOSIS — S81811A Laceration without foreign body, right lower leg, initial encounter: Secondary | ICD-10-CM | POA: Diagnosis not present

## 2023-06-04 DIAGNOSIS — D649 Anemia, unspecified: Secondary | ICD-10-CM | POA: Diagnosis not present

## 2023-06-04 DIAGNOSIS — S81812A Laceration without foreign body, left lower leg, initial encounter: Secondary | ICD-10-CM | POA: Diagnosis not present

## 2023-06-06 DIAGNOSIS — R2689 Other abnormalities of gait and mobility: Secondary | ICD-10-CM | POA: Diagnosis not present

## 2023-06-06 DIAGNOSIS — I619 Nontraumatic intracerebral hemorrhage, unspecified: Secondary | ICD-10-CM | POA: Diagnosis not present

## 2023-06-06 DIAGNOSIS — K921 Melena: Secondary | ICD-10-CM | POA: Diagnosis not present

## 2023-06-06 DIAGNOSIS — M6281 Muscle weakness (generalized): Secondary | ICD-10-CM | POA: Diagnosis not present

## 2023-06-09 DIAGNOSIS — R2689 Other abnormalities of gait and mobility: Secondary | ICD-10-CM | POA: Diagnosis not present

## 2023-06-09 DIAGNOSIS — R600 Localized edema: Secondary | ICD-10-CM | POA: Diagnosis not present

## 2023-06-09 DIAGNOSIS — I619 Nontraumatic intracerebral hemorrhage, unspecified: Secondary | ICD-10-CM | POA: Diagnosis not present

## 2023-06-09 DIAGNOSIS — M6281 Muscle weakness (generalized): Secondary | ICD-10-CM | POA: Diagnosis not present

## 2023-06-09 DIAGNOSIS — K921 Melena: Secondary | ICD-10-CM | POA: Diagnosis not present

## 2023-06-09 DIAGNOSIS — I5030 Unspecified diastolic (congestive) heart failure: Secondary | ICD-10-CM | POA: Diagnosis not present

## 2023-06-09 DIAGNOSIS — I10 Essential (primary) hypertension: Secondary | ICD-10-CM | POA: Diagnosis not present

## 2023-06-11 DIAGNOSIS — M6281 Muscle weakness (generalized): Secondary | ICD-10-CM | POA: Diagnosis not present

## 2023-06-11 DIAGNOSIS — E039 Hypothyroidism, unspecified: Secondary | ICD-10-CM | POA: Diagnosis not present

## 2023-06-11 DIAGNOSIS — E46 Unspecified protein-calorie malnutrition: Secondary | ICD-10-CM | POA: Diagnosis not present

## 2023-06-11 DIAGNOSIS — R2689 Other abnormalities of gait and mobility: Secondary | ICD-10-CM | POA: Diagnosis not present

## 2023-06-11 DIAGNOSIS — K921 Melena: Secondary | ICD-10-CM | POA: Diagnosis not present

## 2023-06-11 DIAGNOSIS — D649 Anemia, unspecified: Secondary | ICD-10-CM | POA: Diagnosis not present

## 2023-06-11 DIAGNOSIS — I619 Nontraumatic intracerebral hemorrhage, unspecified: Secondary | ICD-10-CM | POA: Diagnosis not present

## 2023-06-11 DIAGNOSIS — S81812A Laceration without foreign body, left lower leg, initial encounter: Secondary | ICD-10-CM | POA: Diagnosis not present

## 2023-06-18 DIAGNOSIS — E46 Unspecified protein-calorie malnutrition: Secondary | ICD-10-CM | POA: Diagnosis not present

## 2023-06-18 DIAGNOSIS — I10 Essential (primary) hypertension: Secondary | ICD-10-CM | POA: Diagnosis not present

## 2023-06-18 DIAGNOSIS — E039 Hypothyroidism, unspecified: Secondary | ICD-10-CM | POA: Diagnosis not present

## 2023-06-18 DIAGNOSIS — D649 Anemia, unspecified: Secondary | ICD-10-CM | POA: Diagnosis not present

## 2023-06-23 DIAGNOSIS — S81812A Laceration without foreign body, left lower leg, initial encounter: Secondary | ICD-10-CM | POA: Diagnosis not present

## 2023-06-23 DIAGNOSIS — Z7689 Persons encountering health services in other specified circumstances: Secondary | ICD-10-CM | POA: Diagnosis not present

## 2023-06-25 DIAGNOSIS — S81812A Laceration without foreign body, left lower leg, initial encounter: Secondary | ICD-10-CM | POA: Diagnosis not present

## 2023-06-25 DIAGNOSIS — Z7689 Persons encountering health services in other specified circumstances: Secondary | ICD-10-CM | POA: Diagnosis not present

## 2023-07-02 DIAGNOSIS — I10 Essential (primary) hypertension: Secondary | ICD-10-CM | POA: Diagnosis not present

## 2023-07-02 DIAGNOSIS — E039 Hypothyroidism, unspecified: Secondary | ICD-10-CM | POA: Diagnosis not present

## 2023-07-02 DIAGNOSIS — E46 Unspecified protein-calorie malnutrition: Secondary | ICD-10-CM | POA: Diagnosis not present

## 2023-07-02 DIAGNOSIS — D649 Anemia, unspecified: Secondary | ICD-10-CM | POA: Diagnosis not present

## 2023-07-03 DIAGNOSIS — R350 Frequency of micturition: Secondary | ICD-10-CM | POA: Diagnosis not present

## 2023-07-03 DIAGNOSIS — R351 Nocturia: Secondary | ICD-10-CM | POA: Diagnosis not present

## 2023-07-04 DIAGNOSIS — N390 Urinary tract infection, site not specified: Secondary | ICD-10-CM | POA: Diagnosis not present

## 2023-07-08 DIAGNOSIS — G2581 Restless legs syndrome: Secondary | ICD-10-CM | POA: Diagnosis not present

## 2023-07-08 DIAGNOSIS — Z7689 Persons encountering health services in other specified circumstances: Secondary | ICD-10-CM | POA: Diagnosis not present

## 2023-07-08 DIAGNOSIS — M6281 Muscle weakness (generalized): Secondary | ICD-10-CM | POA: Diagnosis not present

## 2023-07-08 DIAGNOSIS — N183 Chronic kidney disease, stage 3 unspecified: Secondary | ICD-10-CM | POA: Diagnosis not present

## 2023-07-08 DIAGNOSIS — R2681 Unsteadiness on feet: Secondary | ICD-10-CM | POA: Diagnosis not present

## 2023-07-08 DIAGNOSIS — N390 Urinary tract infection, site not specified: Secondary | ICD-10-CM | POA: Diagnosis not present

## 2023-07-09 DIAGNOSIS — R2689 Other abnormalities of gait and mobility: Secondary | ICD-10-CM | POA: Diagnosis not present

## 2023-07-09 DIAGNOSIS — D649 Anemia, unspecified: Secondary | ICD-10-CM | POA: Diagnosis not present

## 2023-07-09 DIAGNOSIS — M6281 Muscle weakness (generalized): Secondary | ICD-10-CM | POA: Diagnosis not present

## 2023-07-09 DIAGNOSIS — Z7689 Persons encountering health services in other specified circumstances: Secondary | ICD-10-CM | POA: Diagnosis not present

## 2023-07-09 DIAGNOSIS — H34811 Central retinal vein occlusion, right eye, with macular edema: Secondary | ICD-10-CM | POA: Diagnosis not present

## 2023-07-09 DIAGNOSIS — I10 Essential (primary) hypertension: Secondary | ICD-10-CM | POA: Diagnosis not present

## 2023-07-09 DIAGNOSIS — I5030 Unspecified diastolic (congestive) heart failure: Secondary | ICD-10-CM | POA: Diagnosis not present

## 2023-07-09 DIAGNOSIS — H4051X2 Glaucoma secondary to other eye disorders, right eye, moderate stage: Secondary | ICD-10-CM | POA: Diagnosis not present

## 2023-07-09 DIAGNOSIS — E039 Hypothyroidism, unspecified: Secondary | ICD-10-CM | POA: Diagnosis not present

## 2023-07-09 DIAGNOSIS — E46 Unspecified protein-calorie malnutrition: Secondary | ICD-10-CM | POA: Diagnosis not present

## 2023-07-16 DIAGNOSIS — S51012A Laceration without foreign body of left elbow, initial encounter: Secondary | ICD-10-CM | POA: Diagnosis not present

## 2023-07-16 DIAGNOSIS — E46 Unspecified protein-calorie malnutrition: Secondary | ICD-10-CM | POA: Diagnosis not present

## 2023-07-16 DIAGNOSIS — D649 Anemia, unspecified: Secondary | ICD-10-CM | POA: Diagnosis not present

## 2023-07-16 DIAGNOSIS — E039 Hypothyroidism, unspecified: Secondary | ICD-10-CM | POA: Diagnosis not present

## 2023-07-23 DIAGNOSIS — Z7689 Persons encountering health services in other specified circumstances: Secondary | ICD-10-CM | POA: Diagnosis not present

## 2023-07-23 DIAGNOSIS — R2689 Other abnormalities of gait and mobility: Secondary | ICD-10-CM | POA: Diagnosis not present

## 2023-07-23 DIAGNOSIS — S51012A Laceration without foreign body of left elbow, initial encounter: Secondary | ICD-10-CM | POA: Diagnosis not present

## 2023-07-23 DIAGNOSIS — D649 Anemia, unspecified: Secondary | ICD-10-CM | POA: Diagnosis not present

## 2023-07-23 DIAGNOSIS — E039 Hypothyroidism, unspecified: Secondary | ICD-10-CM | POA: Diagnosis not present

## 2023-07-23 DIAGNOSIS — N1832 Chronic kidney disease, stage 3b: Secondary | ICD-10-CM | POA: Diagnosis not present

## 2023-07-23 DIAGNOSIS — I5030 Unspecified diastolic (congestive) heart failure: Secondary | ICD-10-CM | POA: Diagnosis not present

## 2023-07-23 DIAGNOSIS — E46 Unspecified protein-calorie malnutrition: Secondary | ICD-10-CM | POA: Diagnosis not present

## 2023-07-23 DIAGNOSIS — M6281 Muscle weakness (generalized): Secondary | ICD-10-CM | POA: Diagnosis not present

## 2023-07-23 DIAGNOSIS — I10 Essential (primary) hypertension: Secondary | ICD-10-CM | POA: Diagnosis not present

## 2023-07-23 DIAGNOSIS — Z87440 Personal history of urinary (tract) infections: Secondary | ICD-10-CM | POA: Diagnosis not present

## 2023-07-24 DIAGNOSIS — N390 Urinary tract infection, site not specified: Secondary | ICD-10-CM | POA: Diagnosis not present

## 2023-07-24 DIAGNOSIS — Z7689 Persons encountering health services in other specified circumstances: Secondary | ICD-10-CM | POA: Diagnosis not present

## 2023-07-24 DIAGNOSIS — I5030 Unspecified diastolic (congestive) heart failure: Secondary | ICD-10-CM | POA: Diagnosis not present

## 2023-07-24 DIAGNOSIS — R2689 Other abnormalities of gait and mobility: Secondary | ICD-10-CM | POA: Diagnosis not present

## 2023-07-24 DIAGNOSIS — M6281 Muscle weakness (generalized): Secondary | ICD-10-CM | POA: Diagnosis not present

## 2023-07-24 DIAGNOSIS — N1832 Chronic kidney disease, stage 3b: Secondary | ICD-10-CM | POA: Diagnosis not present

## 2023-07-24 DIAGNOSIS — D649 Anemia, unspecified: Secondary | ICD-10-CM | POA: Diagnosis not present

## 2023-07-24 DIAGNOSIS — Z87440 Personal history of urinary (tract) infections: Secondary | ICD-10-CM | POA: Diagnosis not present

## 2023-07-24 DIAGNOSIS — I10 Essential (primary) hypertension: Secondary | ICD-10-CM | POA: Diagnosis not present

## 2023-08-05 DIAGNOSIS — I10 Essential (primary) hypertension: Secondary | ICD-10-CM | POA: Diagnosis not present

## 2023-08-06 ENCOUNTER — Encounter: Payer: Medicare PPO | Attending: Physical Medicine & Rehabilitation | Admitting: Physical Medicine & Rehabilitation

## 2023-08-06 ENCOUNTER — Encounter: Payer: Self-pay | Admitting: Physical Medicine & Rehabilitation

## 2023-08-06 VITALS — BP 103/64 | HR 68 | Ht 66.0 in | Wt 135.4 lb

## 2023-08-06 DIAGNOSIS — I610 Nontraumatic intracerebral hemorrhage in hemisphere, subcortical: Secondary | ICD-10-CM | POA: Diagnosis not present

## 2023-08-06 DIAGNOSIS — R252 Cramp and spasm: Secondary | ICD-10-CM | POA: Diagnosis not present

## 2023-08-06 DIAGNOSIS — K5901 Slow transit constipation: Secondary | ICD-10-CM | POA: Insufficient documentation

## 2023-08-06 NOTE — Patient Instructions (Signed)
ALWAYS FEEL FREE TO CALL OUR OFFICE WITH ANY PROBLEMS OR QUESTIONS [phone])  **PLEASE NOTE** ALL MEDICATION REFILL REQUESTS (INCLUDING CONTROLLED SUBSTANCES) NEED TO BE MADE AT LEAST 7 DAYS PRIOR TO REFILL BEING DUE. ANY REFILL REQUESTS INSIDE THAT TIME FRAME MAY RESULT IN DELAYS IN RECEIVING YOUR PRESCRIPTION.

## 2023-08-06 NOTE — Progress Notes (Signed)
Subjective:    Patient ID: [name], female    DOB: [birth year], 89 y.o.   MRN: [id number]  HPI  [name] is here today in follow up of her May visit. After our visit she had a GI bleed and was admitted the same day. It sounds as if it was a hemorrhoid bleed.  She has had no problems since then.  Otherwise she's been doing fairly well. However, she is having cramps and spasms which are most prominent at nighttime.  She says that it is hard to stretch because she really cannot stand or do things on her own prior to going to sleep.  Therapy is working with her 4 days a week on gait and strength training.  She states that they are not doing stretches routinely however.  She is taking Requip 3 times a day and also is on gabapentin at night which she is unsure is doing any good for her.  She is using a rolling walker for balance.  She uses a wheelchair when she is by herself.  She is frustrated that she still requires a wheelchair for mobility while she is on her own.   Pain Inventory Average Pain 0 Pain Right Now 0 My pain is  leg cramps at night when lying down  LOCATION OF PAIN  legs  BOWEL Number of stools per week: 5-7 Oral laxative use  miralax Type of laxative Metamucil    BLADDER Pads    Mobility ability to climb steps?  yes do you drive?  no use a wheelchair transfers alone Do you have any goals in this area?  yes  Function retired I need assistance with the following:  dressing and bathing Do you have any goals in this area?  yes  Neuro/Psych weakness numbness tingling trouble walking spasms dizziness confusion anxiety  Prior Studies Any changes since last visit?  no  Physicians involved in your care Any changes since last visit?  no   Family History  Problem Relation Age of Onset   Cancer Mother        BREAST   COPD Father    Emphysema Father    Cancer Daughter        breast   Social History   Socioeconomic History   Marital status:  Widowed    Spouse name: Not on file   Number of children: Not on file   Years of education: Not on file   Highest education level: Not on file  Occupational History   Occupation: retired  Tobacco Use   Smoking status: Never   Smokeless tobacco: Never  Vaping Use   Vaping status: Never Used  Substance and Sexual Activity   Alcohol use: No   Drug use: No   Sexual activity: Never  Other Topics Concern   Not on file  Social History Narrative   Lots of family nearby   She has an aide 4 days per week.    Lives at [facility] since a fall and CVA in 2023   Social Determinants of Health   Financial Resource Strain: Low Risk  (12/27/2021)   Overall Financial Resource Strain (CARDIA)    Difficulty of Paying Living Expenses: Not hard at all  Food Insecurity: No Food Insecurity (04/09/2023)   Hunger Vital Sign    Worried About Running Out of Food in the Last Year: Never true    Ran Out of Food in the Last Year: Never true  Transportation Needs: No Transportation  Needs (04/09/2023)   PRAPARE - Administrator, Civil Service (Medical): No    Lack of Transportation (Non-Medical): No  Physical Activity: Insufficiently Active (12/27/2021)   Exercise Vital Sign    Days of Exercise per Week: 7 days    Minutes of Exercise per Session: 20 min  Stress: No Stress Concern Present (12/27/2021)   [employer] of Occupational Health - Occupational Stress Questionnaire    Feeling of Stress : Not at all  Social Connections: Moderately Integrated (12/27/2021)   Social Connection and Isolation Panel [NHANES]    Frequency of Communication with Friends and Family: More than three times a week    Frequency of Social Gatherings with Friends and Family: Once a week    Attends Religious Services: 1 to 4 times per year    Active Member of [employer] or Organizations: Yes    Attends Banker Meetings: 1 to 4 times per year    Marital Status: Widowed   Past Surgical History:   Procedure Laterality Date   ABDOMINAL HYSTERECTOMY     APPENDECTOMY     BACK SURGERY     BREAST SURGERY     left breast biopsy   CARDIOVERSION N/A 05/09/2020   Procedure: CARDIOVERSION;  Surgeon: Chilton Si, MD;  Location: [facility] ENDOSCOPY;  Service: Cardiovascular;  Laterality: N/A;   COLONOSCOPY WITH PROPOFOL N/A 04/09/2023   Procedure: COLONOSCOPY WITH PROPOFOL;  Surgeon: Jenel Lucks, MD;  Location: [facility] ENDOSCOPY;  Service: Gastroenterology;  Laterality: N/A;   COLOSTOMY N/A 01/06/2020   Procedure: Colostomy;  Surgeon: Gaynelle Adu, MD;  Location: [facility] OR;  Service: General;  Laterality: N/A;   EYE SURGERY     cataract   HARDWARE REMOVAL Left 01/01/2020   Procedure: HARDWARE REMOVAL;  Surgeon: Bradly Bienenstock, MD;  Location: [facility] OR;  Service: Orthopedics;  Laterality: Left;   HIP ARTHROPLASTY Left 12/03/2020   Procedure: ARTHROPLASTY BIPOLAR HIP (HEMIARTHROPLASTY);  Surgeon: Sheral Apley, MD;  Location: [facility] OR;  Service: Orthopedics;  Laterality: Left;   HIP ARTHROPLASTY Left    partial   IR KYPHO EA ADDL LEVEL THORACIC OR LUMBAR  06/27/2021   IR RADIOLOGIST EVAL & MGMT  07/13/2021   LAPAROTOMY N/A 01/06/2020   Procedure: Exploratory Laparotomy, Open Sigmoid colectomy;  Surgeon: Gaynelle Adu, MD;  Location: [facility] OR;  Service: General;  Laterality: N/A;   LIPOMA EXCISION Left 04/25/2015   Procedure: EXCISION OF LIPOMA LEFT ARM;  Surgeon: Manus Rudd, MD;  Location: [facility];  Service: General;  Laterality: Left;   LUMBAR LAMINECTOMY/DECOMPRESSION MICRODISCECTOMY  11/20/2011   Procedure: LUMBAR LAMINECTOMY/DECOMPRESSION MICRODISCECTOMY;  Surgeon: Illene Labrador [place];  Location: [facility] ORS;  Service: Orthopedics;  Laterality: N/A;  Decompressive Laminectomy L2 to the Sacrum (X-Ray)   LYSIS OF ADHESION N/A 01/17/2021   Procedure: RIGID PROCTOSCOPY;  Surgeon: Karie Soda, MD;  Location: [facility] ORS;  Service: General;  Laterality: N/A;   OPEN REDUCTION INTERNAL FIXATION (ORIF) DISTAL  RADIAL FRACTURE Left 01/01/2020   Procedure: OPEN REDUCTION INTERNAL FIXATION (ORIF) DISTAL RADIAL FRACTURE;  Surgeon: Bradly Bienenstock, MD;  Location: [facility] OR;  Service: Orthopedics;  Laterality: Left;   OTHER SURGICAL HISTORY     left wrist surgery - has plate in left wrist    OTHER SURGICAL HISTORY     right knee surgery due to torn cartilage    TONSILLECTOMY     WRIST FRACTURE SURGERY Left    XI ROBOTIC ASSISTED COLOSTOMY TAKEDOWN N/A 01/17/2021   Procedure: XI ROBOTIC  ASSISTED OSTOMY TAKEDOWN, LYSIS OF ADHESIONS, RECTOSIGMOID RESECTION, BILATERAL TAP BLOCK;  Surgeon: Karie Soda, MD;  Location: [facility] ORS;  Service: General;  Laterality: N/A;   Past Medical History:  Diagnosis Date   Closed fracture of left distal radius 12/29/2019   DVT (deep venous thrombosis) (HCC) 09/2015   RLE   Dysrhythmia    Afib   GERD (gastroesophageal reflux disease)    Hip fracture (HCC) 12/02/2020   Hypertension    Hypothyroidism    Melanoma (HCC) 06/16/2017   Facial melanoma, removed by Dr. Alean Rinne   Peripheral vascular disease (HCC)    peripheral neuropathy   Ht 5\' 6"  (1.676 m)   Wt 135 lb 6.4 oz (61.4 kg)   BMI 21.85 kg/m   Opioid Risk Score:   Fall Risk Score:  `1  Depression screen PHQ 2/9     08/06/2023    1:02 PM 04/02/2023    2:07 PM 08/14/2022    3:27 PM 05/17/2022   11:30 AM 03/12/2022    2:02 PM 12/27/2021   12:54 PM 10/24/2021    1:37 PM  Depression screen PHQ 2/9  Decreased Interest 0 0 0 1 0 1 1  Down, Depressed, Hopeless 0 0 0 1 0 1 1  PHQ - 2 Score 0 0 0 2 0 2 2  Altered sleeping    0 0 0 0  Tired, decreased energy    1 0 1 1  Change in appetite    0 0 0 0  Feeling bad or failure about yourself     2 0 0 0  Trouble concentrating    1 0 0 1  Moving slowly or fidgety/restless    0 0 0 0  Suicidal thoughts     0 0 0  PHQ-9 Score    6 0 3 4  Difficult doing work/chores     Not difficult at all Somewhat difficult Somewhat difficult    Review of Systems  Musculoskeletal:  Positive  for gait problem.  Neurological:  Positive for dizziness, weakness and numbness.  Psychiatric/Behavioral:  Positive for confusion.        Anxiety  All other systems reviewed and are negative.     Objective:   Physical Exam General: No acute distress HEENT: NCAT, EOMI, oral membranes moist Cards: reg rate  Chest: normal effort Abdomen: Soft, NT, ND Skin: a few scattered bruises Extremities: no edema Psych: pleasant and appropriate    Neuro:  Alert and oriented x 3. Normal insight and awareness. STM deficits. Normal language and speech. Cranial nerve exam unremarkable. RUE 4+ 5/5, RLE 4+/5 prox to distal. LUE and LLE 4+ to 5/5. Decreased LT/pain in RLE tr/2 and in LLE below knee 1 to 1+2 --atable exam  DTR's 1+. no resting tone Musculoskeletal: no jt pain today           Assessment & Plan:  Medical Problem List and Plan: 1. Functional deficits secondary to left thalamic intracranial hemorrhage             -HEP, therapy at [facility] (therapeutic exercise) -need to work on stretches -now wearing AFO for balance and support 2.  Leg numbness and pain: -chronic d/t lumbar spondylosis and radiculopathy -celebrex prn ok -unfortunately sensory symptoms will be long term in legs.  3.  Hypertension: per primary              -  4: pAF: Per primary and cardiology   5: bilateral leg pain/cramps:  -still off  statin -Also likely related to her lumbar spine disease             -stretching before bed, therapy program.   -magnesium at hs  -Continue multivitamin and appropriate nutrition. 6: Urinary retention: continue Flomax             -on prophylactic abx             -fluid ratioing to morning as possible   7:  Chronic kidney disease    18: Polymyalgia rheumatica: prednisone 2 mg with lunch 8. Dysphagia:               -Resolved  . 9. macular degeneration of the right eye,   .   20 minutes of face to face patient care time were spent during this visit. All questions were  encouraged and answered. Follow up with me PRN

## 2023-08-12 DIAGNOSIS — S83411A Sprain of medial collateral ligament of right knee, initial encounter: Secondary | ICD-10-CM | POA: Diagnosis not present

## 2023-08-12 DIAGNOSIS — R2689 Other abnormalities of gait and mobility: Secondary | ICD-10-CM | POA: Diagnosis not present

## 2023-08-12 DIAGNOSIS — M6281 Muscle weakness (generalized): Secondary | ICD-10-CM | POA: Diagnosis not present

## 2023-08-12 DIAGNOSIS — D649 Anemia, unspecified: Secondary | ICD-10-CM | POA: Diagnosis not present

## 2023-08-13 DIAGNOSIS — M1712 Unilateral primary osteoarthritis, left knee: Secondary | ICD-10-CM | POA: Diagnosis not present

## 2023-08-14 DIAGNOSIS — S8391XA Sprain of unspecified site of right knee, initial encounter: Secondary | ICD-10-CM | POA: Diagnosis not present

## 2023-08-14 DIAGNOSIS — D649 Anemia, unspecified: Secondary | ICD-10-CM | POA: Diagnosis not present

## 2023-08-14 DIAGNOSIS — M6281 Muscle weakness (generalized): Secondary | ICD-10-CM | POA: Diagnosis not present

## 2023-08-14 DIAGNOSIS — R2689 Other abnormalities of gait and mobility: Secondary | ICD-10-CM | POA: Diagnosis not present

## 2023-09-03 DIAGNOSIS — M1711 Unilateral primary osteoarthritis, right knee: Secondary | ICD-10-CM | POA: Diagnosis not present

## 2023-09-04 DIAGNOSIS — E46 Unspecified protein-calorie malnutrition: Secondary | ICD-10-CM | POA: Diagnosis not present

## 2023-09-04 DIAGNOSIS — R2689 Other abnormalities of gait and mobility: Secondary | ICD-10-CM | POA: Diagnosis not present

## 2023-09-04 DIAGNOSIS — D649 Anemia, unspecified: Secondary | ICD-10-CM | POA: Diagnosis not present

## 2023-09-04 DIAGNOSIS — E039 Hypothyroidism, unspecified: Secondary | ICD-10-CM | POA: Diagnosis not present

## 2023-09-04 DIAGNOSIS — I10 Essential (primary) hypertension: Secondary | ICD-10-CM | POA: Diagnosis not present

## 2023-09-04 DIAGNOSIS — Z7689 Persons encountering health services in other specified circumstances: Secondary | ICD-10-CM | POA: Diagnosis not present

## 2023-09-04 DIAGNOSIS — M62838 Other muscle spasm: Secondary | ICD-10-CM | POA: Diagnosis not present

## 2023-09-08 DIAGNOSIS — D649 Anemia, unspecified: Secondary | ICD-10-CM | POA: Diagnosis not present

## 2023-09-08 DIAGNOSIS — R2689 Other abnormalities of gait and mobility: Secondary | ICD-10-CM | POA: Diagnosis not present

## 2023-09-08 DIAGNOSIS — M6281 Muscle weakness (generalized): Secondary | ICD-10-CM | POA: Diagnosis not present

## 2023-09-08 DIAGNOSIS — M62838 Other muscle spasm: Secondary | ICD-10-CM | POA: Diagnosis not present

## 2023-09-11 DIAGNOSIS — E46 Unspecified protein-calorie malnutrition: Secondary | ICD-10-CM | POA: Diagnosis not present

## 2023-09-11 DIAGNOSIS — D649 Anemia, unspecified: Secondary | ICD-10-CM | POA: Diagnosis not present

## 2023-09-11 DIAGNOSIS — I10 Essential (primary) hypertension: Secondary | ICD-10-CM | POA: Diagnosis not present

## 2023-09-11 DIAGNOSIS — E039 Hypothyroidism, unspecified: Secondary | ICD-10-CM | POA: Diagnosis not present

## 2023-09-15 DIAGNOSIS — M6281 Muscle weakness (generalized): Secondary | ICD-10-CM | POA: Diagnosis not present

## 2023-09-15 DIAGNOSIS — R2689 Other abnormalities of gait and mobility: Secondary | ICD-10-CM | POA: Diagnosis not present

## 2023-09-15 DIAGNOSIS — D649 Anemia, unspecified: Secondary | ICD-10-CM | POA: Diagnosis not present

## 2023-09-15 DIAGNOSIS — Z9181 History of falling: Secondary | ICD-10-CM | POA: Diagnosis not present

## 2023-09-15 DIAGNOSIS — Z7689 Persons encountering health services in other specified circumstances: Secondary | ICD-10-CM | POA: Diagnosis not present

## 2023-09-17 DIAGNOSIS — H34811 Central retinal vein occlusion, right eye, with macular edema: Secondary | ICD-10-CM | POA: Diagnosis not present

## 2023-09-18 DIAGNOSIS — I10 Essential (primary) hypertension: Secondary | ICD-10-CM | POA: Diagnosis not present

## 2023-09-18 DIAGNOSIS — E039 Hypothyroidism, unspecified: Secondary | ICD-10-CM | POA: Diagnosis not present

## 2023-09-18 DIAGNOSIS — D649 Anemia, unspecified: Secondary | ICD-10-CM | POA: Diagnosis not present

## 2023-09-18 DIAGNOSIS — E46 Unspecified protein-calorie malnutrition: Secondary | ICD-10-CM | POA: Diagnosis not present

## 2023-09-25 DIAGNOSIS — D649 Anemia, unspecified: Secondary | ICD-10-CM | POA: Diagnosis not present

## 2023-09-25 DIAGNOSIS — E039 Hypothyroidism, unspecified: Secondary | ICD-10-CM | POA: Diagnosis not present

## 2023-09-25 DIAGNOSIS — E46 Unspecified protein-calorie malnutrition: Secondary | ICD-10-CM | POA: Diagnosis not present

## 2023-09-25 DIAGNOSIS — I10 Essential (primary) hypertension: Secondary | ICD-10-CM | POA: Diagnosis not present

## 2023-09-29 DIAGNOSIS — D649 Anemia, unspecified: Secondary | ICD-10-CM | POA: Diagnosis not present

## 2023-09-29 DIAGNOSIS — E039 Hypothyroidism, unspecified: Secondary | ICD-10-CM | POA: Diagnosis not present

## 2023-09-29 DIAGNOSIS — I10 Essential (primary) hypertension: Secondary | ICD-10-CM | POA: Diagnosis not present

## 2023-09-29 DIAGNOSIS — E46 Unspecified protein-calorie malnutrition: Secondary | ICD-10-CM | POA: Diagnosis not present

## 2023-09-30 DIAGNOSIS — R2689 Other abnormalities of gait and mobility: Secondary | ICD-10-CM | POA: Diagnosis not present

## 2023-09-30 DIAGNOSIS — Z7689 Persons encountering health services in other specified circumstances: Secondary | ICD-10-CM | POA: Diagnosis not present

## 2023-09-30 DIAGNOSIS — M62838 Other muscle spasm: Secondary | ICD-10-CM | POA: Diagnosis not present

## 2023-09-30 DIAGNOSIS — M6281 Muscle weakness (generalized): Secondary | ICD-10-CM | POA: Diagnosis not present

## 2023-09-30 DIAGNOSIS — D649 Anemia, unspecified: Secondary | ICD-10-CM | POA: Diagnosis not present

## 2023-10-06 DIAGNOSIS — Z7689 Persons encountering health services in other specified circumstances: Secondary | ICD-10-CM | POA: Diagnosis not present

## 2023-10-06 DIAGNOSIS — M6281 Muscle weakness (generalized): Secondary | ICD-10-CM | POA: Diagnosis not present

## 2023-10-06 DIAGNOSIS — M62838 Other muscle spasm: Secondary | ICD-10-CM | POA: Diagnosis not present

## 2023-10-06 DIAGNOSIS — R2689 Other abnormalities of gait and mobility: Secondary | ICD-10-CM | POA: Diagnosis not present

## 2023-10-06 DIAGNOSIS — D649 Anemia, unspecified: Secondary | ICD-10-CM | POA: Diagnosis not present

## 2023-10-08 DIAGNOSIS — D649 Anemia, unspecified: Secondary | ICD-10-CM | POA: Diagnosis not present

## 2023-10-08 DIAGNOSIS — I10 Essential (primary) hypertension: Secondary | ICD-10-CM | POA: Diagnosis not present

## 2023-10-08 DIAGNOSIS — E039 Hypothyroidism, unspecified: Secondary | ICD-10-CM | POA: Diagnosis not present

## 2023-10-08 DIAGNOSIS — E46 Unspecified protein-calorie malnutrition: Secondary | ICD-10-CM | POA: Diagnosis not present

## 2023-10-08 DIAGNOSIS — M6281 Muscle weakness (generalized): Secondary | ICD-10-CM | POA: Diagnosis not present

## 2023-10-08 DIAGNOSIS — M62838 Other muscle spasm: Secondary | ICD-10-CM | POA: Diagnosis not present

## 2023-10-08 DIAGNOSIS — R2689 Other abnormalities of gait and mobility: Secondary | ICD-10-CM | POA: Diagnosis not present

## 2023-10-08 DIAGNOSIS — Z7689 Persons encountering health services in other specified circumstances: Secondary | ICD-10-CM | POA: Diagnosis not present

## 2023-10-14 DIAGNOSIS — D649 Anemia, unspecified: Secondary | ICD-10-CM | POA: Diagnosis not present

## 2023-10-15 DIAGNOSIS — D649 Anemia, unspecified: Secondary | ICD-10-CM | POA: Diagnosis not present

## 2023-10-15 DIAGNOSIS — E039 Hypothyroidism, unspecified: Secondary | ICD-10-CM | POA: Diagnosis not present

## 2023-10-15 DIAGNOSIS — E46 Unspecified protein-calorie malnutrition: Secondary | ICD-10-CM | POA: Diagnosis not present

## 2023-10-15 DIAGNOSIS — I10 Essential (primary) hypertension: Secondary | ICD-10-CM | POA: Diagnosis not present

## 2023-10-16 DIAGNOSIS — Z7689 Persons encountering health services in other specified circumstances: Secondary | ICD-10-CM | POA: Diagnosis not present

## 2023-10-16 DIAGNOSIS — D649 Anemia, unspecified: Secondary | ICD-10-CM | POA: Diagnosis not present

## 2023-10-16 DIAGNOSIS — I10 Essential (primary) hypertension: Secondary | ICD-10-CM | POA: Diagnosis not present

## 2023-10-16 DIAGNOSIS — M6281 Muscle weakness (generalized): Secondary | ICD-10-CM | POA: Diagnosis not present

## 2023-10-16 DIAGNOSIS — R2689 Other abnormalities of gait and mobility: Secondary | ICD-10-CM | POA: Diagnosis not present

## 2023-10-16 DIAGNOSIS — M62838 Other muscle spasm: Secondary | ICD-10-CM | POA: Diagnosis not present

## 2023-10-20 ENCOUNTER — Emergency Department (HOSPITAL_COMMUNITY)
Admission: EM | Admit: 2023-10-20 | Discharge: 2023-10-21 | Disposition: A | Discharge status: Home / Self Care | Payer: Medicare PPO | Attending: Emergency Medicine | Admitting: Emergency Medicine

## 2023-10-20 ENCOUNTER — Emergency Department (HOSPITAL_COMMUNITY): Payer: Medicare PPO

## 2023-10-20 ENCOUNTER — Other Ambulatory Visit: Payer: Self-pay

## 2023-10-20 ENCOUNTER — Encounter (HOSPITAL_COMMUNITY): Payer: Self-pay | Admitting: Emergency Medicine

## 2023-10-20 ENCOUNTER — Emergency Department (HOSPITAL_BASED_OUTPATIENT_CLINIC_OR_DEPARTMENT_OTHER): Payer: Medicare PPO

## 2023-10-20 DIAGNOSIS — M1711 Unilateral primary osteoarthritis, right knee: Secondary | ICD-10-CM | POA: Diagnosis not present

## 2023-10-20 DIAGNOSIS — R29818 Other symptoms and signs involving the nervous system: Secondary | ICD-10-CM | POA: Diagnosis not present

## 2023-10-20 DIAGNOSIS — R531 Weakness: Secondary | ICD-10-CM | POA: Insufficient documentation

## 2023-10-20 DIAGNOSIS — Z8673 Personal history of transient ischemic attack (TIA), and cerebral infarction without residual deficits: Secondary | ICD-10-CM | POA: Diagnosis not present

## 2023-10-20 DIAGNOSIS — I615 Nontraumatic intracerebral hemorrhage, intraventricular: Secondary | ICD-10-CM | POA: Diagnosis not present

## 2023-10-20 DIAGNOSIS — E039 Hypothyroidism, unspecified: Secondary | ICD-10-CM | POA: Insufficient documentation

## 2023-10-20 DIAGNOSIS — Z7982 Long term (current) use of aspirin: Secondary | ICD-10-CM | POA: Diagnosis not present

## 2023-10-20 DIAGNOSIS — D649 Anemia, unspecified: Secondary | ICD-10-CM | POA: Diagnosis not present

## 2023-10-20 DIAGNOSIS — G4489 Other headache syndrome: Secondary | ICD-10-CM | POA: Diagnosis not present

## 2023-10-20 DIAGNOSIS — M25461 Effusion, right knee: Secondary | ICD-10-CM | POA: Diagnosis not present

## 2023-10-20 DIAGNOSIS — I5032 Chronic diastolic (congestive) heart failure: Secondary | ICD-10-CM | POA: Diagnosis not present

## 2023-10-20 DIAGNOSIS — I130 Hypertensive heart and chronic kidney disease with heart failure and stage 1 through stage 4 chronic kidney disease, or unspecified chronic kidney disease: Secondary | ICD-10-CM | POA: Diagnosis not present

## 2023-10-20 DIAGNOSIS — Z86718 Personal history of other venous thrombosis and embolism: Secondary | ICD-10-CM | POA: Diagnosis not present

## 2023-10-20 DIAGNOSIS — M7989 Other specified soft tissue disorders: Secondary | ICD-10-CM

## 2023-10-20 DIAGNOSIS — Z79890 Hormone replacement therapy (postmenopausal): Secondary | ICD-10-CM | POA: Diagnosis not present

## 2023-10-20 DIAGNOSIS — N1832 Chronic kidney disease, stage 3b: Secondary | ICD-10-CM | POA: Insufficient documentation

## 2023-10-20 DIAGNOSIS — M25561 Pain in right knee: Secondary | ICD-10-CM | POA: Diagnosis not present

## 2023-10-20 DIAGNOSIS — M6281 Muscle weakness (generalized): Secondary | ICD-10-CM | POA: Diagnosis not present

## 2023-10-20 DIAGNOSIS — I6782 Cerebral ischemia: Secondary | ICD-10-CM | POA: Diagnosis not present

## 2023-10-20 DIAGNOSIS — R2689 Other abnormalities of gait and mobility: Secondary | ICD-10-CM | POA: Diagnosis not present

## 2023-10-20 DIAGNOSIS — R519 Headache, unspecified: Secondary | ICD-10-CM | POA: Insufficient documentation

## 2023-10-20 DIAGNOSIS — M62838 Other muscle spasm: Secondary | ICD-10-CM | POA: Diagnosis not present

## 2023-10-20 DIAGNOSIS — R29898 Other symptoms and signs involving the musculoskeletal system: Secondary | ICD-10-CM

## 2023-10-20 DIAGNOSIS — I10 Essential (primary) hypertension: Secondary | ICD-10-CM | POA: Diagnosis not present

## 2023-10-20 DIAGNOSIS — I4819 Other persistent atrial fibrillation: Secondary | ICD-10-CM | POA: Diagnosis not present

## 2023-10-20 DIAGNOSIS — I4891 Unspecified atrial fibrillation: Secondary | ICD-10-CM | POA: Diagnosis not present

## 2023-10-20 DIAGNOSIS — Z79899 Other long term (current) drug therapy: Secondary | ICD-10-CM | POA: Diagnosis not present

## 2023-10-20 DIAGNOSIS — M11261 Other chondrocalcinosis, right knee: Secondary | ICD-10-CM | POA: Diagnosis not present

## 2023-10-20 DIAGNOSIS — Z85820 Personal history of malignant melanoma of skin: Secondary | ICD-10-CM | POA: Diagnosis not present

## 2023-10-20 LAB — URINALYSIS, ROUTINE W REFLEX MICROSCOPIC
Bilirubin Urine: NEGATIVE
Glucose, UA: NEGATIVE mg/dL
Hgb urine dipstick: NEGATIVE
Ketones, ur: NEGATIVE mg/dL
Leukocytes,Ua: NEGATIVE
Nitrite: NEGATIVE
Protein, ur: NEGATIVE mg/dL
Specific Gravity, Urine: 1.008 (ref 1.005–1.030)
pH: 7 (ref 5.0–8.0)

## 2023-10-20 LAB — COMPREHENSIVE METABOLIC PANEL
ALT: 16 U/L (ref 0–44)
AST: 13 U/L — ABNORMAL LOW (ref 15–41)
Albumin: 3.1 g/dL — ABNORMAL LOW (ref 3.5–5.0)
Alkaline Phosphatase: 100 U/L (ref 38–126)
Anion gap: 8 (ref 5–15)
BUN: 21 mg/dL (ref 8–23)
CO2: 25 mmol/L (ref 22–32)
Calcium: 9.4 mg/dL (ref 8.9–10.3)
Chloride: 109 mmol/L (ref 98–111)
Creatinine, Ser: 1.64 mg/dL — ABNORMAL HIGH (ref 0.44–1.00)
GFR, Estimated: 28 mL/min — ABNORMAL LOW (ref >60)
Glucose, Bld: 95 mg/dL (ref 70–99)
Potassium: 4.2 mmol/L (ref 3.5–5.1)
Sodium: 142 mmol/L (ref 135–145)
Total Bilirubin: 0.8 mg/dL (ref <1.2)
Total Protein: 5.8 g/dL — ABNORMAL LOW (ref 6.5–8.1)

## 2023-10-20 LAB — RAPID URINE DRUG SCREEN, HOSP PERFORMED
Amphetamines: NOT DETECTED
Barbiturates: NOT DETECTED
Benzodiazepines: NOT DETECTED
Cocaine: NOT DETECTED
Opiates: NOT DETECTED
Tetrahydrocannabinol: NOT DETECTED

## 2023-10-20 LAB — DIFFERENTIAL
Abs Immature Granulocytes: 0.03 10*3/uL (ref 0.00–0.07)
Basophils Absolute: 0 10*3/uL (ref 0.0–0.1)
Basophils Relative: 0 %
Eosinophils Absolute: 0.3 10*3/uL (ref 0.0–0.5)
Eosinophils Relative: 4 %
Immature Granulocytes: 0 %
Lymphocytes Relative: 19 %
Lymphs Abs: 1.7 10*3/uL (ref 0.7–4.0)
Monocytes Absolute: 0.9 10*3/uL (ref 0.1–1.0)
Monocytes Relative: 11 %
Neutro Abs: 5.6 10*3/uL (ref 1.7–7.7)
Neutrophils Relative %: 66 %

## 2023-10-20 LAB — CBC
HCT: 32 % — ABNORMAL LOW (ref 36.0–46.0)
Hemoglobin: 9.7 g/dL — ABNORMAL LOW (ref 12.0–15.0)
MCH: 26.8 pg (ref 26.0–34.0)
MCHC: 30.3 g/dL (ref 30.0–36.0)
MCV: 88.4 fL (ref 80.0–100.0)
Platelets: 314 10*3/uL (ref 150–400)
RBC: 3.62 MIL/uL — ABNORMAL LOW (ref 3.87–5.11)
RDW: 15.9 % — ABNORMAL HIGH (ref 11.5–15.5)
WBC: 8.6 10*3/uL (ref 4.0–10.5)
nRBC: 0 % (ref 0.0–0.2)

## 2023-10-20 LAB — APTT: aPTT: 34 s (ref 24–36)

## 2023-10-20 LAB — I-STAT CHEM 8, ED
BUN: 22 mg/dL (ref 8–23)
Calcium, Ion: 1.24 mmol/L (ref 1.15–1.40)
Chloride: 108 mmol/L (ref 98–111)
Creatinine, Ser: 1.5 mg/dL — ABNORMAL HIGH (ref 0.44–1.00)
Glucose, Bld: 93 mg/dL (ref 70–99)
HCT: 31 % — ABNORMAL LOW (ref 36.0–46.0)
Hemoglobin: 10.5 g/dL — ABNORMAL LOW (ref 12.0–15.0)
Potassium: 4.1 mmol/L (ref 3.5–5.1)
Sodium: 143 mmol/L (ref 135–145)
TCO2: 24 mmol/L (ref 22–32)

## 2023-10-20 LAB — ETHANOL: Alcohol, Ethyl (B): <10 mg/dL (ref <10)

## 2023-10-20 LAB — PROTIME-INR
INR: 1.1 (ref 0.8–1.2)
Prothrombin Time: 14.7 s (ref 11.4–15.2)

## 2023-10-20 MED ORDER — DILTIAZEM HCL ER COATED BEADS 120 MG PO CP24
120.0000 mg | ORAL_CAPSULE | ORAL | Status: AC
  Administered 2023-10-20: 120 mg via ORAL
  Filled 2023-10-20: qty 1

## 2023-10-20 MED ORDER — LOSARTAN POTASSIUM 50 MG PO TABS
50.0000 mg | ORAL_TABLET | Freq: Once | ORAL | Status: AC
  Administered 2023-10-20: 50 mg via ORAL
  Filled 2023-10-20: qty 1

## 2023-10-20 MED ORDER — HYDRALAZINE HCL 25 MG PO TABS
25.0000 mg | ORAL_TABLET | Freq: Once | ORAL | Status: AC
  Administered 2023-10-20: 25 mg via ORAL
  Filled 2023-10-20: qty 1

## 2023-10-20 MED ORDER — SODIUM CHLORIDE 0.9 % IV BOLUS
1000.0000 mL | Freq: Once | INTRAVENOUS | Status: AC
  Administered 2023-10-20: 1000 mL via INTRAVENOUS

## 2023-10-20 MED ORDER — AMLODIPINE BESYLATE 5 MG PO TABS: 5.0000 mg | ORAL_TABLET | Freq: Every day | ORAL | 2 refills | Status: AC

## 2023-10-20 MED ORDER — AMLODIPINE BESYLATE 5 MG PO TABS
5.0000 mg | ORAL_TABLET | Freq: Every day | ORAL | 2 refills | Status: DC
Start: 2023-10-20 — End: 2023-10-20

## 2023-10-20 NOTE — ED Notes (Signed)
Attempted to ambulate patient with walker and assist x2.. Patient struggling to move right leg to ambulate. Patient states that she feels like the leg is "stuck." Patient was able to take approximately 4 steps but due to being unsteady she was returned to stretcher and assisted to lying position. Per patient's family, at bedside, patient normally transfers better than she did here but is only out of her wheelchair when she is working with physical therapy at her facility. ED attending physician, Dr. Jarold Motto, made aware.

## 2023-10-20 NOTE — ED Triage Notes (Signed)
Patient arrives from [facility] with right leg weakness and headache since yesterday at approximately 1200. Per EMS, patient has aphasia at baseline from history of stroke. Per EMS, patient does not take blood thinners. Patient states that she noticed that she was leaning towards the right side when she woke up this morning.  EMS VS: BP190/100 CBG 123 HR 70 afib h/x afib SpO2 95% room air

## 2023-10-20 NOTE — ED Notes (Signed)
Patient hypertensive (please see flowsheets for VS); ED attending physician, Dr. Eloise Harman, made aware via face-to-face conversation. Per ED attending physician, Dr. Eloise Harman, patient's SBP to be below 200 prior to patient being discharged.

## 2023-10-20 NOTE — ED Provider Notes (Signed)
[place] EMERGENCY DEPARTMENT AT [facility] Provider Note  CSN: [id number] Arrival date & time: 10/20/23 0907  Chief Complaint(s) Weakness and Headache  HPI [name] is a 89 y.o. female prior DVT, A-fib, hemorrhagic stroke presenting to the emergency department with right leg weakness.  She reports that she woke up this morning, she noticed her right leg was weak and she was having trouble walking, falling over to that side.  She also reports a headache which started today.  No nausea or vomiting.  No arm weakness or left leg weakness.  No facial droop, trouble swallowing.  No fevers or chills, chest pain or abdominal pain.  Per EMS, nursing home reported that the symptoms began yesterday around 12 PM.  No back pain   Past Medical History Past Medical History:  Diagnosis Date   Closed fracture of left distal radius 12/29/2019   DVT (deep venous thrombosis) (HCC) 09/2015   RLE   Dysrhythmia    Afib   GERD (gastroesophageal reflux disease)    Hip fracture (HCC) 12/02/2020   Hypertension    Hypothyroidism    Melanoma (HCC) 06/16/2017   Facial melanoma, removed by Dr. Alean Rinne   Peripheral vascular disease [facility])    peripheral neuropathy   Patient Active Problem List   Diagnosis Date Noted   Leg cramps 08/06/2023   Slow transit constipation 08/06/2023   Gastrointestinal hemorrhage 04/06/2023   Hematochezia 04/03/2023   AAA (abdominal aortic aneurysm) (HCC) 04/03/2023   CKD stage 3b, GFR 30-44 ml/min (HCC) 04/03/2023   Acute lower GI bleeding 04/02/2023   Primary osteoarthritis of left knee 08/14/2022   Urinary incontinence 08/14/2022   Acute on chronic diastolic HF (heart failure) (HCC)    Persistent atrial fibrillation (HCC)    ICH (intracerebral hemorrhage) (HCC) 03/25/2022   Hypertensive emergency    Osteoporosis 10/24/2021   Chronic anticoagulation 01/17/2021   Parastomal hernia s/p colostomy takedown & repair 01/17/2021 01/17/2021   Polymyalgia rheumatica  (HCC) 01/11/2021   (HFpEF) heart failure with preserved ejection fraction (HCC) 10/27/2020   Injury of extensor or abductor muscle, fascia, or tendon of thumb at forearm level 08/03/2020   Orthostatic hypotension 05/24/2020   Acute blood loss anemia 05/19/2020   Traumatic hematoma of left hip 05/19/2020   Current chronic use of systemic steroids 05/19/2020   Advanced age 21/12/2019   Secondary hypercoagulable state (HCC) 03/08/2020   Hypokalemia 02/15/2020   History of diverticulitis of colon with perforation 01/26/2020   Anemia    Debility 01/14/2020   Physical debility 01/14/2020   Chronic atrial fibrillation (HCC)    Recurrent falls 12/29/2019   Positive ANA (antinuclear antibody) 12/29/2019   Osteoarthritis 11/13/2017   Hypertriglyceridemia 01/29/2017   Excessive gas 01/09/2017   Physical exam 07/31/2016   Elevated lipase 03/06/2016   Chest pain 03/06/2016   Hypothyroidism 01/26/2016   HTN (hypertension) 01/26/2016   Left leg numbness 10/06/2012   Home Medication(s) Prior to Admission medications   Medication Sig Start Date End Date Taking? Authorizing Provider  acetaminophen (TYLENOL) 325 MG tablet Take 650 mg by mouth every 8 (eight) hours as needed for mild pain (pain score 1-3) or moderate pain (pain score 4-6).   Yes [provider]  aspirin EC 81 MG tablet Take 1 tablet (81 mg total) by mouth daily. Swallow whole. 07/18/22  Yes Micki Riley, MD  brimonidine (ALPHAGAN) 0.2 % ophthalmic solution Place 1 drop into the right eye 2 (two) times daily.   Yes [provider]  cyclobenzaprine (FLEXERIL) 5 MG tablet Take 5 mg by mouth daily in the afternoon.   Yes [provider]  cyclobenzaprine (FLEXERIL) 5 MG tablet Take 5 mg by mouth every evening.   Yes [provider]  diclofenac Sodium (VOLTAREN) 1 % GEL Apply 2 g topically every 6 (six) hours as needed (age-related osteoporosis).   Yes [provider]  diclofenac Sodium  (VOLTAREN) 1 % GEL Apply 4 g topically every 6 (six) hours as needed (Osteoathritis).   Yes [provider]  diltiazem (CARDIZEM CD) 120 MG 24 hr capsule Take 1 capsule (120 mg total) by mouth every evening. 04/30/22  Yes Setzer, Lynnell Jude, PA-C  DORZOLAMIDE HCL-TIMOLOL MAL OP Place 1 drop into the right eye in the morning and at bedtime. Cosopt 22.3 mg/6.8 mg/ml   Yes [provider]  gabapentin (NEURONTIN) 100 MG capsule Take 2 capsules (200 mg total) by mouth at bedtime. 04/30/22  Yes Setzer, Lynnell Jude, PA-C  gabapentin (NEURONTIN) 100 MG capsule Take 100 mg by mouth 2 (two) times daily.   Yes [provider]  hydrocortisone (ANUSOL-HC) 2.5 % rectal cream Place 1 Application rectally every 6 (six) hours as needed for hemorrhoids or anal itching.   Yes [provider]  hydrocortisone (ANUSOL-HC) 25 MG suppository Place 1 suppository (25 mg total) rectally at bedtime. 04/04/23  Yes Briant Cedar, MD  hydrocortisone (ANUSOL-HC) 25 MG suppository Place 25 mg rectally every 8 (eight) hours as needed for hemorrhoids or anal itching.   Yes [provider]  hydroxypropyl methylcellulose / hypromellose (ISOPTO TEARS / GONIOVISC) 2.5 % ophthalmic solution Place 1 drop into the left eye 4 (four) times daily.   Yes [provider]  levothyroxine (SYNTHROID) 50 MCG tablet Take 1 tablet (50 mcg total) by mouth daily before breakfast. 04/30/22  Yes Love, Evlyn Kanner, PA-C  loperamide (IMODIUM A-D) 2 MG tablet Take 2 mg by mouth every 6 (six) hours as needed for diarrhea or loose stools.   Yes [provider]  losartan (COZAAR) 25 MG tablet Take 50 mg by mouth every evening. 09/04/23  Yes [provider]  losartan (COZAAR) 50 MG tablet Take 50 mg by mouth in the morning.   Yes [provider]  Magnesium 250 MG TABS Take 250 mg by mouth at bedtime.   Yes [provider]  melatonin 3 MG TABS tablet Take 6 mg by mouth at bedtime.    Yes [provider]  Menthol, Topical Analgesic, (BIOFREEZE) 4 % GEL Apply 1 Application topically daily as needed (pain).   Yes [provider]  methocarbamol (ROBAXIN) 500 MG tablet Take 500 mg by mouth every evening. 09/05/23  Yes [provider]  Multiple Vitamins-Minerals (PRESERVISION AREDS 2 PO) Take 1 tablet by mouth daily with lunch.   Yes [provider]  nadolol (CORGARD) 80 MG tablet Take 1 tablet (80 mg total) by mouth daily. 05/01/22  Yes Setzer, Lynnell Jude, PA-C  nitroGLYCERIN (NITROSTAT) 0.4 MG SL tablet Place 1 tablet (0.4 mg total) under the tongue every 5 (five) minutes as needed for chest pain. 04/30/22  Yes Setzer, Lynnell Jude, PA-C  ondansetron (ZOFRAN) 4 MG tablet Take 4 mg by mouth every 6 (six) hours as needed for nausea or vomiting.   Yes [provider]  pantoprazole (PROTONIX) 40 MG tablet Take 1 tablet (40 mg total) by mouth daily. 04/30/22  Yes Setzer, Lynnell Jude, PA-C  Polyethylene Glycol 3350 (MIRALAX PO) Take 1 packet  by mouth daily.   Yes [provider]  polyvinyl alcohol (LIQUIFILM TEARS) 1.4 % ophthalmic solution Place 1 drop into the right eye as needed for dry eyes. 04/30/22  Yes Setzer, Lynnell Jude, PA-C  predniSONE (DELTASONE) 20 MG tablet Take 40 mg by mouth daily in the afternoon.   Yes [provider]  rOPINIRole (REQUIP) 1 MG tablet Take 1 mg by mouth at bedtime.   Yes [provider]  sennosides-docusate sodium (SENOKOT-S) 8.6-50 MG tablet Take 2 tablets by mouth daily.   Yes [provider]  trimethoprim (TRIMPEX) 100 MG tablet Take 50 mg by mouth daily.   Yes [provider]  Vitamin D, Ergocalciferol, (DRISDOL) 1.25 MG (50000 UNIT) CAPS capsule Take 50,000 Units by mouth every 7 (seven) days.   Yes [provider]  witch hazel-glycerin (TUCKS) pad Apply 1 Application topically as needed for itching.   Yes [provider]  hydrocortisone cream 1 % Apply 1  Application topically 3 (three) times daily as needed for itching. Patient not taking: Reported on 10/20/2023    [provider]  predniSONE (DELTASONE) 1 MG tablet Take 2 tablets (2 mg total) by mouth daily with lunch. Patient not taking: Reported on 08/06/2023 04/30/22   Milinda Antis, PA-C                                                                                                                                    Past Surgical History Past Surgical History:  Procedure Laterality Date   ABDOMINAL HYSTERECTOMY     APPENDECTOMY     BACK SURGERY     BREAST SURGERY     left breast biopsy   CARDIOVERSION N/A 05/09/2020   Procedure: CARDIOVERSION;  Surgeon: Chilton Si, MD;  Location[facility] ENDOSCOPY;  Service: Cardiovascular;  Laterality: N/A;   COLONOSCOPY WITH PROPOFOL N/A 04/09/2023   Procedure: COLONOSCOPY WITH PROPOFOL;  Surgeon: Jenel Lucks, MD;  Location: [facility] ENDOSCOPY;  Service: Gastroenterology;  Laterality: N/A;   COLOSTOMY N/A 01/06/2020   Procedure: Colostomy;  Surgeon: Gaynelle Adu, MD;  Location: [facility] OR;  Service: General;  Laterality: N/A;   EYE SURGERY     cataract   HARDWARE REMOVAL Left 01/01/2020   Procedure: HARDWARE REMOVAL;  Surgeon: Bradly Bienenstock, MD;  Location: [facility] OR;  Service: Orthopedics;  Laterality: Left;   HIP ARTHROPLASTY Left 12/03/2020   Procedure: ARTHROPLASTY BIPOLAR HIP (HEMIARTHROPLASTY);  Surgeon: Sheral Apley, MD;  Location: [facility] OR;  Service: Orthopedics;  Laterality: Left;   HIP ARTHROPLASTY Left    partial   IR KYPHO EA ADDL LEVEL THORACIC OR LUMBAR  06/27/2021   IR RADIOLOGIST EVAL & MGMT  07/13/2021   LAPAROTOMY N/A 01/06/2020   Procedure: Exploratory Laparotomy, Open Sigmoid colectomy;  Surgeon: Gaynelle Adu, MD;  [facility] OR;  Service: General;  Laterality: N/A;   LIPOMA EXCISION Left 04/25/2015   Procedure: EXCISION OF LIPOMA LEFT  ARM;  Surgeon: Manus Rudd, MD;  Location:  SURGERY CENTER;  Service: General;   Laterality: Left;   LUMBAR LAMINECTOMY/DECOMPRESSION MICRODISCECTOMY  11/20/2011   Procedure: LUMBAR LAMINECTOMY/DECOMPRESSION MICRODISCECTOMY;  Surgeon: Illene Labrador [place];  Location: [facility] ORS;  Service: Orthopedics;  Laterality: N/A;  Decompressive Laminectomy L2 to the Sacrum (X-Ray)   LYSIS OF ADHESION N/A 01/17/2021   Procedure: RIGID PROCTOSCOPY;  Surgeon: Karie Soda, MD;  Location: [facility] ORS;  Service: General;  Laterality: N/A;   OPEN REDUCTION INTERNAL FIXATION (ORIF) DISTAL RADIAL FRACTURE Left 01/01/2020   Procedure: OPEN REDUCTION INTERNAL FIXATION (ORIF) DISTAL RADIAL FRACTURE;  Surgeon: Bradly Bienenstock, MD;  Location: [facility] OR;  Service: Orthopedics;  Laterality: Left;   OTHER SURGICAL HISTORY     left wrist surgery - has plate in left wrist    OTHER SURGICAL HISTORY     right knee surgery due to torn cartilage    TONSILLECTOMY     WRIST FRACTURE SURGERY Left    XI ROBOTIC ASSISTED COLOSTOMY TAKEDOWN N/A 01/17/2021   Procedure: XI ROBOTIC ASSISTED OSTOMY TAKEDOWN, LYSIS OF ADHESIONS, RECTOSIGMOID RESECTION, BILATERAL TAP BLOCK;  Surgeon: Karie Soda, MD;  Location: [facility] ORS;  Service: General;  Laterality: N/A;   Family History Family History  Problem Relation Age of Onset   Cancer Mother        BREAST   COPD Father    Emphysema Father    Cancer Daughter        breast    Social History Social History   Tobacco Use   Smoking status: Never   Smokeless tobacco: Never  Vaping Use   Vaping status: Never Used  Substance Use Topics   Alcohol use: No   Drug use: No   Allergies Keflex [cephalexin] and Codeine  Review of Systems Review of Systems  All other systems reviewed and are negative.   Physical Exam Vital Signs  I have reviewed the triage vital signs BP (!) 168/84   Pulse 78   Temp 97.9 F (36.6 C) (Oral)   Resp (!) 22   Ht 5\' 6"  (1.676 m)   Wt 63.5 kg   SpO2 96%   BMI 22.60 kg/m  Physical Exam Vitals and nursing note reviewed.  Constitutional:       General: She is not in acute distress.    Appearance: She is well-developed.  HENT:     Head: Normocephalic and atraumatic.     Mouth/Throat:     Mouth: Mucous membranes are moist.  Eyes:     Pupils: Pupils are equal, round, and reactive to light.  Cardiovascular:     Rate and Rhythm: Normal rate and regular rhythm.     Pulses:          Dorsalis pedis pulses are 2+ on the right side and 2+ on the left side.     Heart sounds: No murmur heard. Pulmonary:     Effort: Pulmonary effort is normal. No respiratory distress.     Breath sounds: Normal breath sounds.  Abdominal:     General: Abdomen is flat.     Palpations: Abdomen is soft.     Tenderness: There is no abdominal tenderness.  Musculoskeletal:        General: No tenderness.     Right lower leg: No edema.     Left lower leg: No edema.  Skin:    General: Skin is warm and dry.  Neurological:     General: No focal deficit present.  Mental Status: She is alert.     Comments: Cranial nerves II through XII intact, mild word finding difficulty, strength 5 out of 5 in the bilateral upper and left lower extremities, 4/5 in the RLE, no sensory deficit to light touch, no dysmetria on finger-nose-finger testing, ambulatory with steady gait.   Psychiatric:        Mood and Affect: Mood normal.        Behavior: Behavior normal.     ED Results and Treatments Labs (all labs ordered are listed, but only abnormal results are displayed) Labs Reviewed  CBC - Abnormal; Notable for the following components:      Result Value   RBC 3.62 (*)    Hemoglobin 9.7 (*)    HCT 32.0 (*)    RDW 15.9 (*)    All other components within normal limits  COMPREHENSIVE METABOLIC PANEL - Abnormal; Notable for the following components:   Creatinine, Ser 1.64 (*)    Total Protein 5.8 (*)    Albumin 3.1 (*)    AST 13 (*)    GFR, Estimated 28 (*)    All other components within normal limits  I-STAT CHEM 8, ED - Abnormal; Notable for the following  components:   Creatinine, Ser 1.50 (*)    Hemoglobin 10.5 (*)    HCT 31.0 (*)    All other components within normal limits  ETHANOL  PROTIME-INR  APTT  DIFFERENTIAL  RAPID URINE DRUG SCREEN, HOSP PERFORMED  URINALYSIS, ROUTINE W REFLEX MICROSCOPIC                                                                                                                          Radiology MR BRAIN WO CONTRAST  Result Date: 10/20/2023 CLINICAL DATA:  Provided history: Headache, neuro deficit. Additional history provided: Right leg weakness. EXAM: MRI HEAD WITHOUT CONTRAST TECHNIQUE: Multiplanar, multiecho pulse sequences of the brain and surrounding structures were obtained without intravenous contrast. COMPARISON:  Head CT 10/20/2023.  Brain MRI 03/25/2022. FINDINGS: Mild intermittent motion degradation. Brain: Mild-to-moderate generalized cerebral atrophy. Chronic hemosiderin deposition within the left thalamus and adjacent white matter, from prior parenchymal hemorrhage at this site. Chronic blood products also noted within the occipital horns of both lateral ventricles, from prior intraventricular extension of this hemorrhage. Chronic lacunar infarcts within the left periatrial white matter and bilateral basal ganglia, unchanged from the prior MRI of 03/25/2022. Multifocal T2 FLAIR hyperintense signal abnormality elsewhere within the cerebral white matter, nonspecific but compatible with moderate chronic small vessel ischemic disease. These findings are similar to the prior MRI. Superimposed prominent perivascular spaces within the bilateral basal ganglia and within the brainstem. No cortical encephalomalacia is identified. There is no acute infarct. No evidence of an intracranial mass. No extra-axial fluid collection. No midline shift. Vascular: Maintained flow voids within the proximal large arterial vessels. Skull and upper cervical spine: No focal recent marrow lesion. Incompletely assessed cervical  spondylosis. Sinuses/Orbits: No mass or acute finding within  the imaged orbits. Prior bilateral ocular lens replacement. Small mucous retention cyst within the right maxillary sinus. Other: Small polyp within the nasopharynx, better appreciated on the head CT performed earlier today. IMPRESSION: 1. Mildly motion degraded exam. 2.  No evidence of an acute intracranial abnormality. 3. Parenchymal atrophy and chronic small vessel ischemic disease. As described. 4. Chronic sequelae of prior left thalamic and intraventricular hemorrhage. Electronically Signed   By: Jackey Loge D.O.   On: 10/20/2023 12:48   CT Head Wo Contrast  Result Date: 10/20/2023 CLINICAL DATA:  Neuro deficit with acute stroke suspected EXAM: CT HEAD WITHOUT CONTRAST TECHNIQUE: Contiguous axial images were obtained from the base of the skull through the vertex without intravenous contrast. RADIATION DOSE REDUCTION: This exam was performed according to the departmental dose-optimization program which includes automated exposure control, adjustment of the mA and/or kV according to patient size and/or use of iterative reconstruction technique. COMPARISON:  04/07/2022 FINDINGS: Brain: No evidence of acute infarction, hemorrhage, hydrocephalus, extra-axial collection or mass lesion/mass effect. Extensive chronic small vessel ischemia in the cerebral white matter. Generalized cerebral volume loss similar to prior. No visible sequela of the previously seen thalamic hemorrhage Vascular: No hyperdense vessel or unexpected calcification. Skull: Normal. Negative for fracture or focal lesion. Sinuses/Orbits: No acute finding. Small polyp in the nasopharynx with calcification is stable at 5 mm. IMPRESSION: 1. No acute or interval finding. 2. Extensive chronic small vessel ischemia. Electronically Signed   By: Tiburcio Pea M.D.   On: 10/20/2023 10:26    Pertinent labs & imaging results that were available during my care of the patient were reviewed by me  and considered in my medical decision making (see MDM for details).  Medications Ordered in ED Medications  sodium chloride 0.9 % bolus 1,000 mL (1,000 mLs Intravenous New Bag/Given 10/20/23 1328)  losartan (COZAAR) tablet 50 mg (50 mg Oral Given 10/20/23 1329)                                                                                                                                     Procedures Procedures  (including critical care time)  Medical Decision Making / ED Course   MDM:  89 year old presenting to the emergency department with leg weakness.  Patient well-appearing, physical examination with noticeable right lower extremity weakness.  Differential includes intracranial bleeding, stroke, recrudescence.  Doubt peripheral nerve cause such as back pathology as a cause of weakness.  Will obtain CT head.  If this is unrevealing, likely will need MRI brain.  Will reassess.  Clinical Course as of 10/20/23 1503  Mon Oct 20, 2023  1437 CT scan is negative for any stroke.  Proceed with MRI which was also negative for any new stroke.  It is possible her symptoms could be related to recrudescence.  She has really no other focal symptoms.  Her labs are concerning for mild dehydration, will give IV fluids.  She does appear slightly dehydrated on exam.  Will check urinalysis to evaluate for urinary infection as cause of recrudescent symptoms.  She also attempted to ambulate with nursing, at baseline she does not ambulate independently, transfers from wheelchair to recliner and wheelchair to toilet.  She was not able to ambulate here with assistance.  She is able to ambulate with PT and a walker at her facility at times.  She also seem to have more pain than anything when trying to ambulate.  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  Will check ultrasound.  Ultimately, if ultrasound is negative, unclear cause of the patient's right leg pain/weakness, but  lower concern for any acute dangerous process. [WS]  1457 Assumed care from Dr Suezanne Jacquet. 89 yo F with hemorrhagic stroke with residual aphasia and RLE weakness who presented with worsening weakness. Now at [facility]. Reported having worsening R weakness and pain and falling to her right side. Woke up today with these symptoms. CT head and MRI was normal. Doesn't really ambulated at baseline. Already works with PT. Said that she was having pain in her leg.  Awaiting DVT US and UA.  [RP]  1502 Signed out to Dr. Eloise Harman pending US DVT and UA [WS]    Clinical Course User Index [RP] Rondel Baton, MD [WS] Lonell Grandchild, MD     Additional history obtained: -Additional history obtained from family and ems -External records from outside source obtained and reviewed including: Chart review including previous notes, labs, imaging, consultation notes including prior notes    Lab Tests: -I ordered, reviewed, and interpreted labs.   The pertinent results include:   Labs Reviewed  CBC - Abnormal; Notable for the following components:      Result Value   RBC 3.62 (*)    Hemoglobin 9.7 (*)    HCT 32.0 (*)    RDW 15.9 (*)    All other components within normal limits  COMPREHENSIVE METABOLIC PANEL - Abnormal; Notable for the following components:   Creatinine, Ser 1.64 (*)    Total Protein 5.8 (*)    Albumin 3.1 (*)    AST 13 (*)    GFR, Estimated 28 (*)    All other components within normal limits  I-STAT CHEM 8, ED - Abnormal; Notable for the following components:   Creatinine, Ser 1.50 (*)    Hemoglobin 10.5 (*)    HCT 31.0 (*)    All other components within normal limits  ETHANOL  PROTIME-INR  APTT  DIFFERENTIAL  RAPID URINE DRUG SCREEN, HOSP PERFORMED  URINALYSIS, ROUTINE W REFLEX MICROSCOPIC    Notable for mild AKI   EKG   EKG Interpretation Date/Time:  Monday October 20 2023 09:47:06 EST Ventricular Rate:  70 PR Interval:    QRS Duration:  82 QT Interval:  443 QTC  Calculation: 478 R Axis:   -58  Text Interpretation: Atrial fibrillation Left anterior fascicular block Abnormal R-wave progression, late transition Confirmed by Alvino Blood ([id number]) on 10/20/2023 10:00:49 AM         Imaging Studies ordered: I ordered imaging studies including MRI brain  On my interpretation imaging demonstrates old infarct area I independently visualized and interpreted imaging. I agree with the radiologist interpretation   Medicines ordered and prescription drug management: Meds ordered this encounter  Medications   sodium chloride 0.9 % bolus 1,000 mL   losartan (COZAAR) tablet 50 mg    -I have reviewed the  patients home medicines and have made adjustments as needed   Cardiac Monitoring: The patient was maintained on a cardiac monitor.  I personally viewed and interpreted the cardiac monitored which showed an underlying rhythm of: afib  Reevaluation: After the interventions noted above, I reevaluated the patient and found that their symptoms have improved  Co morbidities that complicate the patient evaluation  Past Medical History:  Diagnosis Date   Closed fracture of left distal radius 12/29/2019   DVT (deep venous thrombosis) (HCC) 09/2015   RLE   Dysrhythmia    Afib   GERD (gastroesophageal reflux disease)    Hip fracture (HCC) 12/02/2020   Hypertension    Hypothyroidism    Melanoma (HCC) 06/16/2017   Facial melanoma, removed by Dr. Alean Rinne   Peripheral vascular disease [facility])    peripheral neuropathy      Dispostion: Disposition decision including need for hospitalization was considered, and patient disposition pending at time of sign out.    Final Clinical Impression(s) / ED Diagnoses Final diagnoses:  Right leg weakness     This chart was dictated using voice recognition software.  Despite best efforts to proofread,  errors can occur which can change the documentation meaning.    Lonell Grandchild, MD 10/20/23 [phone]

## 2023-10-20 NOTE — Discharge Instructions (Signed)
You were seen for your knee pain and weakness in the emergency department.  Your blood pressure was also found to be very high.  At home, please take Tylenol and over-the-counter gels such as Voltaren gel for the pain.  Please also ice your knee.  Take the amlodipine for your blood pressure.    Check your MyChart online for the results of any tests that had not resulted by the time you left the emergency department.   Follow-up with your primary doctor in 2-3 days regarding your visit.    Return immediately to the emergency department if you experience any of the following: Worsening pain, weakness, or any other concerning symptoms.    Thank you for visiting our Emergency Department. It was a pleasure taking care of you today.

## 2023-10-20 NOTE — ED Provider Notes (Signed)
  Physical Exam  BP (!) 182/96 (BP Location: Right Arm)   Pulse 79   Temp 97.8 F (36.6 C) (Oral)   Resp 18   Ht 5\' 6"  (1.676 m)   Wt 63.5 kg   SpO2 96%   BMI 22.60 kg/m   Physical Exam  Procedures  Procedures  ED Course / MDM   Clinical Course as of 10/21/23 0121  Mon Oct 20, 2023  1437 CT scan is negative for any stroke.  Proceed with MRI which was also negative for any new stroke.  It is possible her symptoms could be related to recrudescence.  She has really no other focal symptoms.  Her labs are concerning for mild dehydration, will give IV fluids.  She does appear slightly dehydrated on exam.  Will check urinalysis to evaluate for urinary infection as cause of recrudescent symptoms.  She also attempted to ambulate with nursing, at baseline she does not ambulate independently, transfers from wheelchair to recliner and wheelchair to toilet.  She was not able to ambulate here with assistance.  She is able to ambulate with PT and a walker at her facility at times.  She also seem to have more pain than anything when trying to ambulate.  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  Will check ultrasound.  Ultimately, if ultrasound is negative, unclear cause of the patient's right leg pain/weakness, but lower concern for any acute dangerous process. [WS]  1457 Assumed care from Dr Suezanne Jacquet. 89 yo F with hemorrhagic stroke with residual aphasia and RLE weakness who presented with worsening weakness. Now at [facility]. Reported having worsening R weakness and pain and falling to her right side. Woke up today with these symptoms. CT head and MRI was normal. Doesn't really ambulated at baseline. Already works with PT. Said that she was having pain in her leg.  Awaiting DVT US and UA.  [RP]  1502 Signed out to Dr. Eloise Harman pending US DVT and UA [WS]  1557 UA without evidence of UTI.  Patient still hypertensive.  Ordered Cardizem and hydralazine [RP]  2300 Ultrasound  without evidence of DVT.  Patient was reassessed and was still complaining of right knee pain.  X-rays were obtained to rule out pathologic fracture.  X-rays without acute abnormality.  Patient is persistently hypertensive and was given some oral hydralazine to assist in lowering her blood pressure.  Appears that she is only on Cozaar at this time.  Based on her renal function and other lab work we will start her on amlodipine for her uncontrolled hypertension.  In terms of her leg it is unclear exactly what is causing her pain could potentially be arthritis or some sort of muscle strain.  Will have her take OTC medication for the pain and follow-up with her primary doctor. [RP]    Clinical Course User Index [RP] Rondel Baton, MD [WS] Lonell Grandchild, MD   Medical Decision Making Amount and/or Complexity of Data Reviewed Labs: ordered. Radiology: ordered.  Risk Prescription drug management.      Rondel Baton, MD 10/21/23 [phone]

## 2023-10-21 DIAGNOSIS — R531 Weakness: Secondary | ICD-10-CM | POA: Diagnosis not present

## 2023-10-21 DIAGNOSIS — Z7401 Bed confinement status: Secondary | ICD-10-CM | POA: Diagnosis not present

## 2023-10-21 DIAGNOSIS — I10 Essential (primary) hypertension: Secondary | ICD-10-CM | POA: Diagnosis not present

## 2023-10-23 DIAGNOSIS — M62838 Other muscle spasm: Secondary | ICD-10-CM | POA: Diagnosis not present

## 2023-10-23 DIAGNOSIS — I10 Essential (primary) hypertension: Secondary | ICD-10-CM | POA: Diagnosis not present

## 2023-10-23 DIAGNOSIS — Z7689 Persons encountering health services in other specified circumstances: Secondary | ICD-10-CM | POA: Diagnosis not present

## 2023-10-23 DIAGNOSIS — R410 Disorientation, unspecified: Secondary | ICD-10-CM | POA: Diagnosis not present

## 2023-10-23 DIAGNOSIS — D649 Anemia, unspecified: Secondary | ICD-10-CM | POA: Diagnosis not present

## 2023-10-23 DIAGNOSIS — R2689 Other abnormalities of gait and mobility: Secondary | ICD-10-CM | POA: Diagnosis not present

## 2023-10-23 DIAGNOSIS — I4891 Unspecified atrial fibrillation: Secondary | ICD-10-CM | POA: Diagnosis not present

## 2023-10-24 DIAGNOSIS — Z7689 Persons encountering health services in other specified circumstances: Secondary | ICD-10-CM | POA: Diagnosis not present

## 2023-10-24 DIAGNOSIS — I4891 Unspecified atrial fibrillation: Secondary | ICD-10-CM | POA: Diagnosis not present

## 2023-10-24 DIAGNOSIS — I10 Essential (primary) hypertension: Secondary | ICD-10-CM | POA: Diagnosis not present

## 2023-10-24 DIAGNOSIS — M6281 Muscle weakness (generalized): Secondary | ICD-10-CM | POA: Diagnosis not present

## 2023-10-24 DIAGNOSIS — D649 Anemia, unspecified: Secondary | ICD-10-CM

## 2023-10-24 DIAGNOSIS — M62838 Other muscle spasm: Secondary | ICD-10-CM | POA: Diagnosis not present

## 2023-10-29 DIAGNOSIS — D649 Anemia, unspecified: Secondary | ICD-10-CM | POA: Diagnosis not present

## 2023-10-29 DIAGNOSIS — M62838 Other muscle spasm: Secondary | ICD-10-CM | POA: Diagnosis not present

## 2023-10-29 DIAGNOSIS — I10 Essential (primary) hypertension: Secondary | ICD-10-CM | POA: Diagnosis not present

## 2023-10-29 DIAGNOSIS — R2689 Other abnormalities of gait and mobility: Secondary | ICD-10-CM | POA: Diagnosis not present

## 2023-10-29 DIAGNOSIS — Z7689 Persons encountering health services in other specified circumstances: Secondary | ICD-10-CM | POA: Diagnosis not present

## 2023-10-29 DIAGNOSIS — I4891 Unspecified atrial fibrillation: Secondary | ICD-10-CM | POA: Diagnosis not present

## 2023-10-29 DIAGNOSIS — M6281 Muscle weakness (generalized): Secondary | ICD-10-CM | POA: Diagnosis not present

## 2023-11-10 DIAGNOSIS — D649 Anemia, unspecified: Secondary | ICD-10-CM | POA: Diagnosis not present

## 2023-11-10 DIAGNOSIS — K625 Hemorrhage of anus and rectum: Secondary | ICD-10-CM | POA: Diagnosis not present

## 2023-11-10 DIAGNOSIS — M62838 Other muscle spasm: Secondary | ICD-10-CM | POA: Diagnosis not present

## 2023-11-10 DIAGNOSIS — R2689 Other abnormalities of gait and mobility: Secondary | ICD-10-CM | POA: Diagnosis not present

## 2023-11-10 DIAGNOSIS — Z7689 Persons encountering health services in other specified circumstances: Secondary | ICD-10-CM | POA: Diagnosis not present

## 2023-11-10 DIAGNOSIS — I10 Essential (primary) hypertension: Secondary | ICD-10-CM | POA: Diagnosis not present

## 2023-11-10 DIAGNOSIS — I4891 Unspecified atrial fibrillation: Secondary | ICD-10-CM | POA: Diagnosis not present

## 2023-11-10 DIAGNOSIS — Z9181 History of falling: Secondary | ICD-10-CM | POA: Diagnosis not present

## 2023-11-11 DIAGNOSIS — D649 Anemia, unspecified: Secondary | ICD-10-CM | POA: Diagnosis not present

## 2023-11-13 DIAGNOSIS — R2689 Other abnormalities of gait and mobility: Secondary | ICD-10-CM | POA: Diagnosis not present

## 2023-11-13 DIAGNOSIS — Z7689 Persons encountering health services in other specified circumstances: Secondary | ICD-10-CM | POA: Diagnosis not present

## 2023-11-13 DIAGNOSIS — I4891 Unspecified atrial fibrillation: Secondary | ICD-10-CM | POA: Diagnosis not present

## 2023-11-13 DIAGNOSIS — M6281 Muscle weakness (generalized): Secondary | ICD-10-CM | POA: Diagnosis not present

## 2023-11-13 DIAGNOSIS — D649 Anemia, unspecified: Secondary | ICD-10-CM | POA: Diagnosis not present

## 2023-11-13 DIAGNOSIS — K625 Hemorrhage of anus and rectum: Secondary | ICD-10-CM | POA: Diagnosis not present

## 2023-11-13 DIAGNOSIS — K649 Unspecified hemorrhoids: Secondary | ICD-10-CM | POA: Diagnosis not present

## 2023-11-13 DIAGNOSIS — I10 Essential (primary) hypertension: Secondary | ICD-10-CM | POA: Diagnosis not present

## 2023-11-18 DIAGNOSIS — H34811 Central retinal vein occlusion, right eye, with macular edema: Secondary | ICD-10-CM | POA: Diagnosis not present

## 2023-11-18 DIAGNOSIS — H4051X2 Glaucoma secondary to other eye disorders, right eye, moderate stage: Secondary | ICD-10-CM | POA: Diagnosis not present

## 2023-12-03 DIAGNOSIS — M1711 Unilateral primary osteoarthritis, right knee: Secondary | ICD-10-CM | POA: Diagnosis not present

## 2023-12-04 DIAGNOSIS — R2689 Other abnormalities of gait and mobility: Secondary | ICD-10-CM | POA: Diagnosis not present

## 2023-12-04 DIAGNOSIS — I10 Essential (primary) hypertension: Secondary | ICD-10-CM | POA: Diagnosis not present

## 2023-12-04 DIAGNOSIS — D649 Anemia, unspecified: Secondary | ICD-10-CM | POA: Diagnosis not present

## 2023-12-04 DIAGNOSIS — I4891 Unspecified atrial fibrillation: Secondary | ICD-10-CM | POA: Diagnosis not present

## 2023-12-04 DIAGNOSIS — M6281 Muscle weakness (generalized): Secondary | ICD-10-CM | POA: Diagnosis not present

## 2024-01-02 DIAGNOSIS — I10 Essential (primary) hypertension: Secondary | ICD-10-CM | POA: Diagnosis not present

## 2024-01-02 DIAGNOSIS — I4891 Unspecified atrial fibrillation: Secondary | ICD-10-CM | POA: Diagnosis not present

## 2024-01-02 DIAGNOSIS — M79604 Pain in right leg: Secondary | ICD-10-CM | POA: Diagnosis not present

## 2024-01-02 DIAGNOSIS — D649 Anemia, unspecified: Secondary | ICD-10-CM | POA: Diagnosis not present

## 2024-01-02 DIAGNOSIS — M6281 Muscle weakness (generalized): Secondary | ICD-10-CM | POA: Diagnosis not present

## 2024-01-09 DIAGNOSIS — I10 Essential (primary) hypertension: Secondary | ICD-10-CM | POA: Diagnosis not present

## 2024-01-09 DIAGNOSIS — D649 Anemia, unspecified: Secondary | ICD-10-CM | POA: Diagnosis not present

## 2024-01-09 DIAGNOSIS — M79604 Pain in right leg: Secondary | ICD-10-CM | POA: Diagnosis not present

## 2024-01-09 DIAGNOSIS — I4891 Unspecified atrial fibrillation: Secondary | ICD-10-CM | POA: Diagnosis not present

## 2024-01-09 DIAGNOSIS — R2689 Other abnormalities of gait and mobility: Secondary | ICD-10-CM | POA: Diagnosis not present

## 2024-01-09 DIAGNOSIS — M6281 Muscle weakness (generalized): Secondary | ICD-10-CM | POA: Diagnosis not present

## 2024-01-15 ENCOUNTER — Ambulatory Visit: Payer: Medicare PPO | Admitting: Neurology

## 2024-01-15 DIAGNOSIS — I10 Essential (primary) hypertension: Secondary | ICD-10-CM | POA: Diagnosis not present

## 2024-01-15 DIAGNOSIS — I4819 Other persistent atrial fibrillation: Secondary | ICD-10-CM | POA: Diagnosis not present

## 2024-01-15 DIAGNOSIS — E039 Hypothyroidism, unspecified: Secondary | ICD-10-CM | POA: Diagnosis not present

## 2024-01-15 DIAGNOSIS — N1832 Chronic kidney disease, stage 3b: Secondary | ICD-10-CM | POA: Diagnosis not present

## 2024-01-16 DIAGNOSIS — E039 Hypothyroidism, unspecified: Secondary | ICD-10-CM | POA: Diagnosis not present

## 2024-01-23 DIAGNOSIS — I4819 Other persistent atrial fibrillation: Secondary | ICD-10-CM | POA: Diagnosis not present

## 2024-01-23 DIAGNOSIS — E039 Hypothyroidism, unspecified: Secondary | ICD-10-CM | POA: Diagnosis not present

## 2024-01-23 DIAGNOSIS — N1832 Chronic kidney disease, stage 3b: Secondary | ICD-10-CM | POA: Diagnosis not present

## 2024-01-23 DIAGNOSIS — M79605 Pain in left leg: Secondary | ICD-10-CM | POA: Diagnosis not present

## 2024-01-23 DIAGNOSIS — I10 Essential (primary) hypertension: Secondary | ICD-10-CM | POA: Diagnosis not present

## 2024-01-23 DIAGNOSIS — N3020 Other chronic cystitis without hematuria: Secondary | ICD-10-CM | POA: Diagnosis not present

## 2024-01-23 DIAGNOSIS — R350 Frequency of micturition: Secondary | ICD-10-CM | POA: Diagnosis not present

## 2024-01-26 DIAGNOSIS — M1711 Unilateral primary osteoarthritis, right knee: Secondary | ICD-10-CM | POA: Diagnosis not present

## 2024-02-02 DIAGNOSIS — M1711 Unilateral primary osteoarthritis, right knee: Secondary | ICD-10-CM | POA: Diagnosis not present

## 2024-02-09 DIAGNOSIS — M1711 Unilateral primary osteoarthritis, right knee: Secondary | ICD-10-CM | POA: Diagnosis not present

## 2024-02-10 DIAGNOSIS — I10 Essential (primary) hypertension: Secondary | ICD-10-CM | POA: Diagnosis not present

## 2024-02-10 DIAGNOSIS — D649 Anemia, unspecified: Secondary | ICD-10-CM | POA: Diagnosis not present

## 2024-02-26 DIAGNOSIS — R3581 Nocturnal polyuria: Secondary | ICD-10-CM | POA: Diagnosis not present

## 2024-02-26 DIAGNOSIS — I4819 Other persistent atrial fibrillation: Secondary | ICD-10-CM | POA: Diagnosis not present

## 2024-02-26 DIAGNOSIS — K5901 Slow transit constipation: Secondary | ICD-10-CM | POA: Diagnosis not present

## 2024-02-26 DIAGNOSIS — I10 Essential (primary) hypertension: Secondary | ICD-10-CM | POA: Diagnosis not present

## 2024-03-24 DIAGNOSIS — G2581 Restless legs syndrome: Secondary | ICD-10-CM | POA: Diagnosis not present

## 2024-03-24 DIAGNOSIS — I10 Essential (primary) hypertension: Secondary | ICD-10-CM | POA: Diagnosis not present

## 2024-03-24 DIAGNOSIS — K5900 Constipation, unspecified: Secondary | ICD-10-CM | POA: Diagnosis not present

## 2024-03-24 DIAGNOSIS — H34811 Central retinal vein occlusion, right eye, with macular edema: Secondary | ICD-10-CM | POA: Diagnosis not present

## 2024-03-24 DIAGNOSIS — K219 Gastro-esophageal reflux disease without esophagitis: Secondary | ICD-10-CM | POA: Diagnosis not present

## 2024-04-20 DIAGNOSIS — H4051X2 Glaucoma secondary to other eye disorders, right eye, moderate stage: Secondary | ICD-10-CM | POA: Diagnosis not present

## 2024-04-20 DIAGNOSIS — H34811 Central retinal vein occlusion, right eye, with macular edema: Secondary | ICD-10-CM | POA: Diagnosis not present

## 2024-05-13 DIAGNOSIS — E46 Unspecified protein-calorie malnutrition: Secondary | ICD-10-CM | POA: Diagnosis not present

## 2024-05-13 DIAGNOSIS — D649 Anemia, unspecified: Secondary | ICD-10-CM | POA: Diagnosis not present

## 2024-05-13 DIAGNOSIS — I10 Essential (primary) hypertension: Secondary | ICD-10-CM | POA: Diagnosis not present

## 2024-05-13 DIAGNOSIS — E039 Hypothyroidism, unspecified: Secondary | ICD-10-CM | POA: Diagnosis not present

## 2024-05-19 DIAGNOSIS — Z96642 Presence of left artificial hip joint: Secondary | ICD-10-CM | POA: Diagnosis not present

## 2024-05-19 DIAGNOSIS — M25552 Pain in left hip: Secondary | ICD-10-CM | POA: Diagnosis not present

## 2024-05-19 DIAGNOSIS — M1712 Unilateral primary osteoarthritis, left knee: Secondary | ICD-10-CM | POA: Diagnosis not present

## 2024-05-20 DIAGNOSIS — D649 Anemia, unspecified: Secondary | ICD-10-CM | POA: Diagnosis not present

## 2024-05-20 DIAGNOSIS — E46 Unspecified protein-calorie malnutrition: Secondary | ICD-10-CM | POA: Diagnosis not present

## 2024-05-20 DIAGNOSIS — I10 Essential (primary) hypertension: Secondary | ICD-10-CM | POA: Diagnosis not present

## 2024-05-20 DIAGNOSIS — E039 Hypothyroidism, unspecified: Secondary | ICD-10-CM | POA: Diagnosis not present

## 2024-05-27 DIAGNOSIS — E039 Hypothyroidism, unspecified: Secondary | ICD-10-CM | POA: Diagnosis not present

## 2024-05-27 DIAGNOSIS — D649 Anemia, unspecified: Secondary | ICD-10-CM | POA: Diagnosis not present

## 2024-05-27 DIAGNOSIS — I10 Essential (primary) hypertension: Secondary | ICD-10-CM | POA: Diagnosis not present

## 2024-05-27 DIAGNOSIS — E46 Unspecified protein-calorie malnutrition: Secondary | ICD-10-CM | POA: Diagnosis not present

## 2024-06-14 DIAGNOSIS — I10 Essential (primary) hypertension: Secondary | ICD-10-CM | POA: Diagnosis not present

## 2024-06-14 DIAGNOSIS — M79606 Pain in leg, unspecified: Secondary | ICD-10-CM | POA: Diagnosis not present

## 2024-06-14 DIAGNOSIS — K219 Gastro-esophageal reflux disease without esophagitis: Secondary | ICD-10-CM | POA: Diagnosis not present

## 2024-06-23 DIAGNOSIS — I739 Peripheral vascular disease, unspecified: Secondary | ICD-10-CM | POA: Diagnosis not present

## 2024-06-23 DIAGNOSIS — L603 Nail dystrophy: Secondary | ICD-10-CM | POA: Diagnosis not present

## 2024-06-23 DIAGNOSIS — L602 Onychogryphosis: Secondary | ICD-10-CM | POA: Diagnosis not present

## 2024-07-12 DIAGNOSIS — G2581 Restless legs syndrome: Secondary | ICD-10-CM | POA: Diagnosis not present

## 2024-07-12 DIAGNOSIS — I10 Essential (primary) hypertension: Secondary | ICD-10-CM | POA: Diagnosis not present

## 2024-07-12 DIAGNOSIS — I69351 Hemiplegia and hemiparesis following cerebral infarction affecting right dominant side: Secondary | ICD-10-CM | POA: Diagnosis not present

## 2024-07-16 DIAGNOSIS — I619 Nontraumatic intracerebral hemorrhage, unspecified: Secondary | ICD-10-CM | POA: Diagnosis not present

## 2024-07-16 DIAGNOSIS — R2689 Other abnormalities of gait and mobility: Secondary | ICD-10-CM | POA: Diagnosis not present

## 2024-07-16 DIAGNOSIS — R278 Other lack of coordination: Secondary | ICD-10-CM | POA: Diagnosis not present

## 2024-07-16 DIAGNOSIS — M6281 Muscle weakness (generalized): Secondary | ICD-10-CM | POA: Diagnosis not present

## 2024-07-19 DIAGNOSIS — I619 Nontraumatic intracerebral hemorrhage, unspecified: Secondary | ICD-10-CM | POA: Diagnosis not present

## 2024-07-19 DIAGNOSIS — R2689 Other abnormalities of gait and mobility: Secondary | ICD-10-CM | POA: Diagnosis not present

## 2024-07-19 DIAGNOSIS — M6281 Muscle weakness (generalized): Secondary | ICD-10-CM | POA: Diagnosis not present

## 2024-07-19 DIAGNOSIS — R278 Other lack of coordination: Secondary | ICD-10-CM | POA: Diagnosis not present

## 2024-07-20 DIAGNOSIS — R2689 Other abnormalities of gait and mobility: Secondary | ICD-10-CM | POA: Diagnosis not present

## 2024-07-20 DIAGNOSIS — I619 Nontraumatic intracerebral hemorrhage, unspecified: Secondary | ICD-10-CM | POA: Diagnosis not present

## 2024-07-20 DIAGNOSIS — R278 Other lack of coordination: Secondary | ICD-10-CM | POA: Diagnosis not present

## 2024-07-20 DIAGNOSIS — M6281 Muscle weakness (generalized): Secondary | ICD-10-CM | POA: Diagnosis not present

## 2024-07-22 DIAGNOSIS — I619 Nontraumatic intracerebral hemorrhage, unspecified: Secondary | ICD-10-CM | POA: Diagnosis not present

## 2024-07-22 DIAGNOSIS — R278 Other lack of coordination: Secondary | ICD-10-CM | POA: Diagnosis not present

## 2024-07-22 DIAGNOSIS — M6281 Muscle weakness (generalized): Secondary | ICD-10-CM | POA: Diagnosis not present

## 2024-07-22 DIAGNOSIS — R2689 Other abnormalities of gait and mobility: Secondary | ICD-10-CM | POA: Diagnosis not present

## 2024-07-23 DIAGNOSIS — M6281 Muscle weakness (generalized): Secondary | ICD-10-CM | POA: Diagnosis not present

## 2024-07-23 DIAGNOSIS — I619 Nontraumatic intracerebral hemorrhage, unspecified: Secondary | ICD-10-CM | POA: Diagnosis not present

## 2024-07-23 DIAGNOSIS — R278 Other lack of coordination: Secondary | ICD-10-CM | POA: Diagnosis not present

## 2024-07-23 DIAGNOSIS — R2689 Other abnormalities of gait and mobility: Secondary | ICD-10-CM | POA: Diagnosis not present

## 2024-07-26 DIAGNOSIS — R2689 Other abnormalities of gait and mobility: Secondary | ICD-10-CM | POA: Diagnosis not present

## 2024-07-26 DIAGNOSIS — I619 Nontraumatic intracerebral hemorrhage, unspecified: Secondary | ICD-10-CM | POA: Diagnosis not present

## 2024-07-26 DIAGNOSIS — M6281 Muscle weakness (generalized): Secondary | ICD-10-CM | POA: Diagnosis not present

## 2024-07-26 DIAGNOSIS — R278 Other lack of coordination: Secondary | ICD-10-CM | POA: Diagnosis not present

## 2024-07-27 DIAGNOSIS — R2689 Other abnormalities of gait and mobility: Secondary | ICD-10-CM | POA: Diagnosis not present

## 2024-07-27 DIAGNOSIS — I619 Nontraumatic intracerebral hemorrhage, unspecified: Secondary | ICD-10-CM | POA: Diagnosis not present

## 2024-07-27 DIAGNOSIS — R278 Other lack of coordination: Secondary | ICD-10-CM | POA: Diagnosis not present

## 2024-07-27 DIAGNOSIS — M6281 Muscle weakness (generalized): Secondary | ICD-10-CM | POA: Diagnosis not present

## 2024-07-29 DIAGNOSIS — R2689 Other abnormalities of gait and mobility: Secondary | ICD-10-CM | POA: Diagnosis not present

## 2024-07-29 DIAGNOSIS — I619 Nontraumatic intracerebral hemorrhage, unspecified: Secondary | ICD-10-CM | POA: Diagnosis not present

## 2024-07-29 DIAGNOSIS — M6281 Muscle weakness (generalized): Secondary | ICD-10-CM | POA: Diagnosis not present

## 2024-07-29 DIAGNOSIS — R278 Other lack of coordination: Secondary | ICD-10-CM | POA: Diagnosis not present

## 2024-07-30 DIAGNOSIS — I619 Nontraumatic intracerebral hemorrhage, unspecified: Secondary | ICD-10-CM | POA: Diagnosis not present

## 2024-07-30 DIAGNOSIS — M6281 Muscle weakness (generalized): Secondary | ICD-10-CM | POA: Diagnosis not present

## 2024-07-30 DIAGNOSIS — R278 Other lack of coordination: Secondary | ICD-10-CM | POA: Diagnosis not present

## 2024-07-30 DIAGNOSIS — R2689 Other abnormalities of gait and mobility: Secondary | ICD-10-CM | POA: Diagnosis not present

## 2024-08-02 DIAGNOSIS — R2689 Other abnormalities of gait and mobility: Secondary | ICD-10-CM | POA: Diagnosis not present

## 2024-08-02 DIAGNOSIS — M6281 Muscle weakness (generalized): Secondary | ICD-10-CM | POA: Diagnosis not present

## 2024-08-02 DIAGNOSIS — R278 Other lack of coordination: Secondary | ICD-10-CM | POA: Diagnosis not present

## 2024-08-02 DIAGNOSIS — I619 Nontraumatic intracerebral hemorrhage, unspecified: Secondary | ICD-10-CM | POA: Diagnosis not present

## 2024-08-04 DIAGNOSIS — I619 Nontraumatic intracerebral hemorrhage, unspecified: Secondary | ICD-10-CM | POA: Diagnosis not present

## 2024-08-04 DIAGNOSIS — R278 Other lack of coordination: Secondary | ICD-10-CM | POA: Diagnosis not present

## 2024-08-04 DIAGNOSIS — M6281 Muscle weakness (generalized): Secondary | ICD-10-CM | POA: Diagnosis not present

## 2024-08-04 DIAGNOSIS — R2689 Other abnormalities of gait and mobility: Secondary | ICD-10-CM | POA: Diagnosis not present

## 2024-08-06 DIAGNOSIS — R2689 Other abnormalities of gait and mobility: Secondary | ICD-10-CM | POA: Diagnosis not present

## 2024-08-06 DIAGNOSIS — R278 Other lack of coordination: Secondary | ICD-10-CM | POA: Diagnosis not present

## 2024-08-06 DIAGNOSIS — I619 Nontraumatic intracerebral hemorrhage, unspecified: Secondary | ICD-10-CM | POA: Diagnosis not present

## 2024-08-06 DIAGNOSIS — M6281 Muscle weakness (generalized): Secondary | ICD-10-CM | POA: Diagnosis not present

## 2024-08-07 DIAGNOSIS — M6281 Muscle weakness (generalized): Secondary | ICD-10-CM | POA: Diagnosis not present

## 2024-08-07 DIAGNOSIS — R2689 Other abnormalities of gait and mobility: Secondary | ICD-10-CM | POA: Diagnosis not present

## 2024-08-07 DIAGNOSIS — I619 Nontraumatic intracerebral hemorrhage, unspecified: Secondary | ICD-10-CM | POA: Diagnosis not present

## 2024-08-07 DIAGNOSIS — R278 Other lack of coordination: Secondary | ICD-10-CM | POA: Diagnosis not present

## 2024-08-09 DIAGNOSIS — R278 Other lack of coordination: Secondary | ICD-10-CM | POA: Diagnosis not present

## 2024-08-09 DIAGNOSIS — R2689 Other abnormalities of gait and mobility: Secondary | ICD-10-CM | POA: Diagnosis not present

## 2024-08-09 DIAGNOSIS — I619 Nontraumatic intracerebral hemorrhage, unspecified: Secondary | ICD-10-CM | POA: Diagnosis not present

## 2024-08-09 DIAGNOSIS — M6281 Muscle weakness (generalized): Secondary | ICD-10-CM | POA: Diagnosis not present

## 2024-08-11 DIAGNOSIS — M6281 Muscle weakness (generalized): Secondary | ICD-10-CM | POA: Diagnosis not present

## 2024-08-11 DIAGNOSIS — I619 Nontraumatic intracerebral hemorrhage, unspecified: Secondary | ICD-10-CM | POA: Diagnosis not present

## 2024-08-11 DIAGNOSIS — R2689 Other abnormalities of gait and mobility: Secondary | ICD-10-CM | POA: Diagnosis not present

## 2024-08-11 DIAGNOSIS — R278 Other lack of coordination: Secondary | ICD-10-CM | POA: Diagnosis not present

## 2024-08-12 DIAGNOSIS — R2689 Other abnormalities of gait and mobility: Secondary | ICD-10-CM | POA: Diagnosis not present

## 2024-08-12 DIAGNOSIS — M6281 Muscle weakness (generalized): Secondary | ICD-10-CM | POA: Diagnosis not present

## 2024-08-12 DIAGNOSIS — R278 Other lack of coordination: Secondary | ICD-10-CM | POA: Diagnosis not present

## 2024-08-12 DIAGNOSIS — I619 Nontraumatic intracerebral hemorrhage, unspecified: Secondary | ICD-10-CM | POA: Diagnosis not present

## 2024-08-13 DIAGNOSIS — R278 Other lack of coordination: Secondary | ICD-10-CM | POA: Diagnosis not present

## 2024-08-13 DIAGNOSIS — M6281 Muscle weakness (generalized): Secondary | ICD-10-CM | POA: Diagnosis not present

## 2024-08-13 DIAGNOSIS — R2689 Other abnormalities of gait and mobility: Secondary | ICD-10-CM | POA: Diagnosis not present

## 2024-08-16 DIAGNOSIS — R2689 Other abnormalities of gait and mobility: Secondary | ICD-10-CM | POA: Diagnosis not present

## 2024-08-16 DIAGNOSIS — I619 Nontraumatic intracerebral hemorrhage, unspecified: Secondary | ICD-10-CM | POA: Diagnosis not present

## 2024-08-16 DIAGNOSIS — M6281 Muscle weakness (generalized): Secondary | ICD-10-CM | POA: Diagnosis not present

## 2024-08-16 DIAGNOSIS — R278 Other lack of coordination: Secondary | ICD-10-CM | POA: Diagnosis not present

## 2024-08-18 DIAGNOSIS — I619 Nontraumatic intracerebral hemorrhage, unspecified: Secondary | ICD-10-CM | POA: Diagnosis not present

## 2024-08-18 DIAGNOSIS — M6281 Muscle weakness (generalized): Secondary | ICD-10-CM | POA: Diagnosis not present

## 2024-08-18 DIAGNOSIS — R278 Other lack of coordination: Secondary | ICD-10-CM | POA: Diagnosis not present

## 2024-08-18 DIAGNOSIS — R2689 Other abnormalities of gait and mobility: Secondary | ICD-10-CM | POA: Diagnosis not present

## 2024-08-19 DIAGNOSIS — I619 Nontraumatic intracerebral hemorrhage, unspecified: Secondary | ICD-10-CM | POA: Diagnosis not present

## 2024-08-19 DIAGNOSIS — R278 Other lack of coordination: Secondary | ICD-10-CM | POA: Diagnosis not present

## 2024-08-19 DIAGNOSIS — M6281 Muscle weakness (generalized): Secondary | ICD-10-CM | POA: Diagnosis not present

## 2024-08-19 DIAGNOSIS — R2689 Other abnormalities of gait and mobility: Secondary | ICD-10-CM | POA: Diagnosis not present

## 2024-08-20 DIAGNOSIS — R2689 Other abnormalities of gait and mobility: Secondary | ICD-10-CM | POA: Diagnosis not present

## 2024-08-20 DIAGNOSIS — M6281 Muscle weakness (generalized): Secondary | ICD-10-CM | POA: Diagnosis not present

## 2024-08-20 DIAGNOSIS — R278 Other lack of coordination: Secondary | ICD-10-CM | POA: Diagnosis not present

## 2024-08-23 DIAGNOSIS — M6281 Muscle weakness (generalized): Secondary | ICD-10-CM | POA: Diagnosis not present

## 2024-08-23 DIAGNOSIS — R2689 Other abnormalities of gait and mobility: Secondary | ICD-10-CM | POA: Diagnosis not present

## 2024-08-23 DIAGNOSIS — R278 Other lack of coordination: Secondary | ICD-10-CM | POA: Diagnosis not present

## 2024-08-24 DIAGNOSIS — H4051X2 Glaucoma secondary to other eye disorders, right eye, moderate stage: Secondary | ICD-10-CM | POA: Diagnosis not present

## 2024-08-24 DIAGNOSIS — H34811 Central retinal vein occlusion, right eye, with macular edema: Secondary | ICD-10-CM | POA: Diagnosis not present

## 2024-08-25 DIAGNOSIS — G2581 Restless legs syndrome: Secondary | ICD-10-CM | POA: Diagnosis not present

## 2024-08-25 DIAGNOSIS — E039 Hypothyroidism, unspecified: Secondary | ICD-10-CM | POA: Diagnosis not present

## 2024-08-25 DIAGNOSIS — R278 Other lack of coordination: Secondary | ICD-10-CM | POA: Diagnosis not present

## 2024-08-25 DIAGNOSIS — M6281 Muscle weakness (generalized): Secondary | ICD-10-CM | POA: Diagnosis not present

## 2024-08-25 DIAGNOSIS — R2689 Other abnormalities of gait and mobility: Secondary | ICD-10-CM | POA: Diagnosis not present

## 2024-08-25 DIAGNOSIS — I619 Nontraumatic intracerebral hemorrhage, unspecified: Secondary | ICD-10-CM | POA: Diagnosis not present

## 2024-08-25 DIAGNOSIS — I10 Essential (primary) hypertension: Secondary | ICD-10-CM | POA: Diagnosis not present

## 2024-08-25 DIAGNOSIS — K219 Gastro-esophageal reflux disease without esophagitis: Secondary | ICD-10-CM | POA: Diagnosis not present

## 2024-08-27 DIAGNOSIS — M6281 Muscle weakness (generalized): Secondary | ICD-10-CM | POA: Diagnosis not present

## 2024-08-27 DIAGNOSIS — R2689 Other abnormalities of gait and mobility: Secondary | ICD-10-CM | POA: Diagnosis not present

## 2024-08-27 DIAGNOSIS — R278 Other lack of coordination: Secondary | ICD-10-CM | POA: Diagnosis not present

## 2024-09-27 DIAGNOSIS — G4700 Insomnia, unspecified: Secondary | ICD-10-CM | POA: Diagnosis not present

## 2024-09-27 DIAGNOSIS — N1832 Chronic kidney disease, stage 3b: Secondary | ICD-10-CM | POA: Diagnosis not present

## 2024-09-27 DIAGNOSIS — M6281 Muscle weakness (generalized): Secondary | ICD-10-CM | POA: Diagnosis not present

## 2024-09-27 DIAGNOSIS — I6990 Unspecified sequelae of unspecified cerebrovascular disease: Secondary | ICD-10-CM | POA: Diagnosis not present

## 2024-09-27 DIAGNOSIS — I69191 Dysphagia following nontraumatic intracerebral hemorrhage: Secondary | ICD-10-CM | POA: Diagnosis not present

## 2024-09-29 DIAGNOSIS — H34811 Central retinal vein occlusion, right eye, with macular edema: Secondary | ICD-10-CM | POA: Diagnosis not present

## 2024-10-11 DIAGNOSIS — H409 Unspecified glaucoma: Secondary | ICD-10-CM | POA: Diagnosis not present

## 2024-10-11 DIAGNOSIS — H5713 Ocular pain, bilateral: Secondary | ICD-10-CM | POA: Diagnosis not present

## 2024-10-11 DIAGNOSIS — I619 Nontraumatic intracerebral hemorrhage, unspecified: Secondary | ICD-10-CM | POA: Diagnosis not present

## 2024-10-17 DIAGNOSIS — I10 Essential (primary) hypertension: Secondary | ICD-10-CM | POA: Diagnosis not present

## 2024-10-17 DIAGNOSIS — E039 Hypothyroidism, unspecified: Secondary | ICD-10-CM | POA: Diagnosis not present

## 2024-10-17 DIAGNOSIS — K219 Gastro-esophageal reflux disease without esophagitis: Secondary | ICD-10-CM | POA: Diagnosis not present

## 2024-10-17 DIAGNOSIS — I4891 Unspecified atrial fibrillation: Secondary | ICD-10-CM | POA: Diagnosis not present

## 2024-10-17 DIAGNOSIS — G2581 Restless legs syndrome: Secondary | ICD-10-CM | POA: Diagnosis not present

## 2024-10-18 DIAGNOSIS — H5712 Ocular pain, left eye: Secondary | ICD-10-CM | POA: Diagnosis not present

## 2024-10-18 DIAGNOSIS — R519 Headache, unspecified: Secondary | ICD-10-CM | POA: Diagnosis not present
